# Patient Record
Sex: Female | Born: 1945 | Race: Black or African American | Hispanic: No | Smoking: Former smoker
Health system: Southern US, Community
[De-identification: ages and names within clinical notes are randomized; demographics above are authoritative.]

## PROBLEM LIST (undated history)

## (undated) DIAGNOSIS — H269 Unspecified cataract: Secondary | ICD-10-CM

## (undated) DIAGNOSIS — N200 Calculus of kidney: Secondary | ICD-10-CM

## (undated) DIAGNOSIS — A0472 Enterocolitis due to Clostridium difficile, not specified as recurrent: Secondary | ICD-10-CM

## (undated) DIAGNOSIS — R0602 Shortness of breath: Secondary | ICD-10-CM

## (undated) DIAGNOSIS — M1990 Unspecified osteoarthritis, unspecified site: Secondary | ICD-10-CM

## (undated) DIAGNOSIS — IMO0001 Reserved for inherently not codable concepts without codable children: Secondary | ICD-10-CM

## (undated) DIAGNOSIS — Z8719 Personal history of other diseases of the digestive system: Secondary | ICD-10-CM

## (undated) DIAGNOSIS — I10 Essential (primary) hypertension: Secondary | ICD-10-CM

## (undated) DIAGNOSIS — T7840XA Allergy, unspecified, initial encounter: Secondary | ICD-10-CM

## (undated) DIAGNOSIS — D509 Iron deficiency anemia, unspecified: Secondary | ICD-10-CM

## (undated) DIAGNOSIS — Z923 Personal history of irradiation: Secondary | ICD-10-CM

## (undated) DIAGNOSIS — K90829 Short bowel syndrome, unspecified: Secondary | ICD-10-CM

## (undated) DIAGNOSIS — M109 Gout, unspecified: Secondary | ICD-10-CM

## (undated) DIAGNOSIS — K3184 Gastroparesis: Secondary | ICD-10-CM

## (undated) DIAGNOSIS — G629 Polyneuropathy, unspecified: Secondary | ICD-10-CM

## (undated) DIAGNOSIS — C50919 Malignant neoplasm of unspecified site of unspecified female breast: Secondary | ICD-10-CM

## (undated) DIAGNOSIS — D72819 Decreased white blood cell count, unspecified: Secondary | ICD-10-CM

## (undated) DIAGNOSIS — E0590 Thyrotoxicosis, unspecified without thyrotoxic crisis or storm: Secondary | ICD-10-CM

## (undated) DIAGNOSIS — Z9221 Personal history of antineoplastic chemotherapy: Secondary | ICD-10-CM

## (undated) DIAGNOSIS — Z5189 Encounter for other specified aftercare: Secondary | ICD-10-CM

## (undated) DIAGNOSIS — K912 Postsurgical malabsorption, not elsewhere classified: Secondary | ICD-10-CM

## (undated) DIAGNOSIS — I8390 Asymptomatic varicose veins of unspecified lower extremity: Secondary | ICD-10-CM

## (undated) DIAGNOSIS — K219 Gastro-esophageal reflux disease without esophagitis: Secondary | ICD-10-CM

## (undated) DIAGNOSIS — I82409 Acute embolism and thrombosis of unspecified deep veins of unspecified lower extremity: Secondary | ICD-10-CM

## (undated) DIAGNOSIS — N390 Urinary tract infection, site not specified: Secondary | ICD-10-CM

## (undated) DIAGNOSIS — K222 Esophageal obstruction: Secondary | ICD-10-CM

## (undated) DIAGNOSIS — K648 Other hemorrhoids: Secondary | ICD-10-CM

## (undated) DIAGNOSIS — Z780 Asymptomatic menopausal state: Secondary | ICD-10-CM

## (undated) DIAGNOSIS — E042 Nontoxic multinodular goiter: Secondary | ICD-10-CM

## (undated) DIAGNOSIS — R112 Nausea with vomiting, unspecified: Secondary | ICD-10-CM

## (undated) DIAGNOSIS — E119 Type 2 diabetes mellitus without complications: Secondary | ICD-10-CM

## (undated) DIAGNOSIS — Z87442 Personal history of urinary calculi: Secondary | ICD-10-CM

## (undated) DIAGNOSIS — K5730 Diverticulosis of large intestine without perforation or abscess without bleeding: Secondary | ICD-10-CM

## (undated) DIAGNOSIS — E876 Hypokalemia: Secondary | ICD-10-CM

## (undated) DIAGNOSIS — Z9889 Other specified postprocedural states: Secondary | ICD-10-CM

## (undated) DIAGNOSIS — E538 Deficiency of other specified B group vitamins: Secondary | ICD-10-CM

## (undated) DIAGNOSIS — E669 Obesity, unspecified: Secondary | ICD-10-CM

## (undated) DIAGNOSIS — E890 Postprocedural hypothyroidism: Secondary | ICD-10-CM

## (undated) DIAGNOSIS — K279 Peptic ulcer, site unspecified, unspecified as acute or chronic, without hemorrhage or perforation: Secondary | ICD-10-CM

## (undated) DIAGNOSIS — I209 Angina pectoris, unspecified: Secondary | ICD-10-CM

## (undated) DIAGNOSIS — Z9981 Dependence on supplemental oxygen: Secondary | ICD-10-CM

## (undated) DIAGNOSIS — G43909 Migraine, unspecified, not intractable, without status migrainosus: Secondary | ICD-10-CM

## (undated) DIAGNOSIS — E785 Hyperlipidemia, unspecified: Secondary | ICD-10-CM

## (undated) HISTORY — PX: ABDOMINAL HYSTERECTOMY: SHX81

## (undated) HISTORY — DX: Type 2 diabetes mellitus without complications: E11.9

## (undated) HISTORY — DX: Peptic ulcer, site unspecified, unspecified as acute or chronic, without hemorrhage or perforation: K27.9

## (undated) HISTORY — DX: Short bowel syndrome, unspecified: K90.829

## (undated) HISTORY — PX: KIDNEY STONE SURGERY: SHX686

## (undated) HISTORY — PX: SMALL INTESTINE SURGERY: SHX150

## (undated) HISTORY — DX: Unspecified cataract: H26.9

## (undated) HISTORY — DX: Gout, unspecified: M10.9

## (undated) HISTORY — PX: UPPER GASTROINTESTINAL ENDOSCOPY: SHX188

## (undated) HISTORY — DX: Unspecified osteoarthritis, unspecified site: M19.90

## (undated) HISTORY — PX: VEIN LIGATION AND STRIPPING: SHX2653

## (undated) HISTORY — DX: Allergy, unspecified, initial encounter: T78.40XA

## (undated) HISTORY — DX: Nontoxic multinodular goiter: E04.2

## (undated) HISTORY — DX: Other hemorrhoids: K64.8

## (undated) HISTORY — PX: REDUCTION MAMMAPLASTY: SUR839

## (undated) HISTORY — DX: Hyperlipidemia, unspecified: E78.5

## (undated) HISTORY — DX: Postprocedural hypothyroidism: E89.0

## (undated) HISTORY — PX: OTHER SURGICAL HISTORY: SHX169

## (undated) HISTORY — DX: Gastro-esophageal reflux disease without esophagitis: K21.9

## (undated) HISTORY — PX: EYE SURGERY: SHX253

## (undated) HISTORY — DX: Asymptomatic menopausal state: Z78.0

## (undated) HISTORY — PX: DILATION AND CURETTAGE OF UTERUS: SHX78

## (undated) HISTORY — DX: Gastroparesis: K31.84

## (undated) HISTORY — DX: Deficiency of other specified B group vitamins: E53.8

## (undated) HISTORY — DX: Essential (primary) hypertension: I10

## (undated) HISTORY — DX: Postsurgical malabsorption, not elsewhere classified: K91.2

## (undated) HISTORY — PX: COLON SURGERY: SHX602

## (undated) HISTORY — PX: LITHOTRIPSY: SUR834

## (undated) HISTORY — PX: BUNIONECTOMY: SHX129

## (undated) HISTORY — DX: Enterocolitis due to Clostridium difficile, not specified as recurrent: A04.72

## (undated) HISTORY — DX: Esophageal obstruction: K22.2

## (undated) HISTORY — DX: Decreased white blood cell count, unspecified: D72.819

## (undated) HISTORY — DX: Iron deficiency anemia, unspecified: D50.9

## (undated) HISTORY — PX: ABDOMINAL ADHESION SURGERY: SHX90

## (undated) HISTORY — DX: Diverticulosis of large intestine without perforation or abscess without bleeding: K57.30

## (undated) HISTORY — DX: Obesity, unspecified: E66.9

## (undated) HISTORY — DX: Encounter for other specified aftercare: Z51.89

## (undated) HISTORY — PX: CHOLECYSTECTOMY: SHX55

## (undated) HISTORY — DX: Thyrotoxicosis, unspecified without thyrotoxic crisis or storm: E05.90

---

## 1983-06-02 HISTORY — PX: BREAST BIOPSY: SHX20

## 1998-01-29 ENCOUNTER — Inpatient Hospital Stay (HOSPITAL_COMMUNITY): Admission: RE | Admit: 1998-01-29 | Discharge: 1998-02-11 | Payer: Self-pay | Admitting: *Deleted

## 1998-05-09 ENCOUNTER — Ambulatory Visit (HOSPITAL_COMMUNITY): Admission: RE | Admit: 1998-05-09 | Discharge: 1998-05-09 | Payer: Self-pay | Admitting: Endocrinology

## 1998-05-09 ENCOUNTER — Encounter: Payer: Self-pay | Admitting: Endocrinology

## 1998-05-21 ENCOUNTER — Encounter: Admission: RE | Admit: 1998-05-21 | Discharge: 1998-06-06 | Payer: Self-pay | Admitting: Rheumatology

## 1998-09-10 ENCOUNTER — Encounter: Payer: Self-pay | Admitting: Emergency Medicine

## 1998-09-10 ENCOUNTER — Emergency Department (HOSPITAL_COMMUNITY): Admission: EM | Admit: 1998-09-10 | Discharge: 1998-09-10 | Payer: Self-pay | Admitting: Emergency Medicine

## 1998-11-01 ENCOUNTER — Ambulatory Visit (HOSPITAL_COMMUNITY): Admission: RE | Admit: 1998-11-01 | Discharge: 1998-11-01 | Payer: Self-pay | Admitting: Orthopedic Surgery

## 1998-11-26 ENCOUNTER — Emergency Department (HOSPITAL_COMMUNITY): Admission: EM | Admit: 1998-11-26 | Discharge: 1998-11-26 | Payer: Self-pay | Admitting: Emergency Medicine

## 1998-12-28 ENCOUNTER — Emergency Department (HOSPITAL_COMMUNITY): Admission: EM | Admit: 1998-12-28 | Discharge: 1998-12-28 | Payer: Self-pay | Admitting: Internal Medicine

## 1998-12-28 ENCOUNTER — Encounter: Payer: Self-pay | Admitting: Internal Medicine

## 1999-01-05 ENCOUNTER — Encounter: Payer: Self-pay | Admitting: Emergency Medicine

## 1999-01-06 ENCOUNTER — Inpatient Hospital Stay (HOSPITAL_COMMUNITY): Admission: EM | Admit: 1999-01-06 | Discharge: 1999-01-10 | Payer: Self-pay | Admitting: Emergency Medicine

## 1999-01-09 ENCOUNTER — Encounter: Payer: Self-pay | Admitting: Gastroenterology

## 1999-04-09 ENCOUNTER — Encounter: Admission: RE | Admit: 1999-04-09 | Discharge: 1999-04-09 | Payer: Self-pay | Admitting: *Deleted

## 1999-05-07 ENCOUNTER — Encounter: Payer: Self-pay | Admitting: Emergency Medicine

## 1999-05-07 ENCOUNTER — Emergency Department (HOSPITAL_COMMUNITY): Admission: EM | Admit: 1999-05-07 | Discharge: 1999-05-07 | Payer: Self-pay | Admitting: Emergency Medicine

## 1999-05-08 ENCOUNTER — Observation Stay (HOSPITAL_COMMUNITY): Admission: EM | Admit: 1999-05-08 | Discharge: 1999-05-10 | Payer: Self-pay | Admitting: Emergency Medicine

## 1999-06-12 ENCOUNTER — Encounter: Payer: Self-pay | Admitting: Endocrinology

## 1999-06-12 ENCOUNTER — Ambulatory Visit (HOSPITAL_COMMUNITY): Admission: RE | Admit: 1999-06-12 | Discharge: 1999-06-12 | Payer: Self-pay | Admitting: Endocrinology

## 1999-08-20 ENCOUNTER — Encounter: Admission: RE | Admit: 1999-08-20 | Discharge: 1999-11-18 | Payer: Self-pay | Admitting: Endocrinology

## 1999-09-28 ENCOUNTER — Encounter: Payer: Self-pay | Admitting: Gastroenterology

## 1999-09-28 ENCOUNTER — Encounter: Payer: Self-pay | Admitting: Emergency Medicine

## 1999-09-28 ENCOUNTER — Inpatient Hospital Stay (HOSPITAL_COMMUNITY): Admission: EM | Admit: 1999-09-28 | Discharge: 1999-10-02 | Payer: Self-pay | Admitting: Emergency Medicine

## 1999-09-29 ENCOUNTER — Encounter: Payer: Self-pay | Admitting: Gastroenterology

## 1999-09-30 ENCOUNTER — Encounter: Payer: Self-pay | Admitting: Gastroenterology

## 1999-10-01 ENCOUNTER — Encounter: Payer: Self-pay | Admitting: Internal Medicine

## 1999-11-11 ENCOUNTER — Emergency Department (HOSPITAL_COMMUNITY): Admission: EM | Admit: 1999-11-11 | Discharge: 1999-11-11 | Payer: Self-pay | Admitting: Emergency Medicine

## 1999-11-27 ENCOUNTER — Encounter: Admission: RE | Admit: 1999-11-27 | Discharge: 1999-11-27 | Payer: Self-pay | Admitting: Orthopedic Surgery

## 1999-11-27 ENCOUNTER — Encounter: Payer: Self-pay | Admitting: Orthopedic Surgery

## 2000-04-12 ENCOUNTER — Encounter: Admission: RE | Admit: 2000-04-12 | Discharge: 2000-04-12 | Payer: Self-pay | Admitting: *Deleted

## 2000-07-31 ENCOUNTER — Encounter: Payer: Self-pay | Admitting: Emergency Medicine

## 2000-07-31 ENCOUNTER — Inpatient Hospital Stay (HOSPITAL_COMMUNITY): Admission: EM | Admit: 2000-07-31 | Discharge: 2000-08-09 | Payer: Self-pay | Admitting: Emergency Medicine

## 2000-08-01 ENCOUNTER — Encounter: Payer: Self-pay | Admitting: Internal Medicine

## 2000-08-02 ENCOUNTER — Encounter: Payer: Self-pay | Admitting: Internal Medicine

## 2000-08-03 ENCOUNTER — Encounter: Payer: Self-pay | Admitting: Internal Medicine

## 2000-08-04 ENCOUNTER — Encounter: Payer: Self-pay | Admitting: Internal Medicine

## 2000-08-05 ENCOUNTER — Encounter: Payer: Self-pay | Admitting: Internal Medicine

## 2000-08-06 ENCOUNTER — Encounter: Payer: Self-pay | Admitting: Internal Medicine

## 2000-09-16 ENCOUNTER — Encounter: Payer: Self-pay | Admitting: Internal Medicine

## 2000-09-16 ENCOUNTER — Emergency Department (HOSPITAL_COMMUNITY): Admission: EM | Admit: 2000-09-16 | Discharge: 2000-09-16 | Payer: Self-pay | Admitting: Internal Medicine

## 2001-02-02 ENCOUNTER — Ambulatory Visit (HOSPITAL_COMMUNITY): Admission: RE | Admit: 2001-02-02 | Discharge: 2001-02-02 | Payer: Self-pay | Admitting: Gastroenterology

## 2001-02-02 DIAGNOSIS — K648 Other hemorrhoids: Secondary | ICD-10-CM | POA: Insufficient documentation

## 2001-04-21 ENCOUNTER — Encounter: Admission: RE | Admit: 2001-04-21 | Discharge: 2001-04-21 | Payer: Self-pay | Admitting: *Deleted

## 2001-05-26 ENCOUNTER — Other Ambulatory Visit: Admission: RE | Admit: 2001-05-26 | Discharge: 2001-06-15 | Payer: Self-pay | Admitting: Obstetrics and Gynecology

## 2001-05-26 ENCOUNTER — Other Ambulatory Visit: Admission: RE | Admit: 2001-05-26 | Discharge: 2001-05-26 | Payer: Self-pay | Admitting: Obstetrics and Gynecology

## 2001-06-07 ENCOUNTER — Encounter: Payer: Self-pay | Admitting: Emergency Medicine

## 2001-06-08 ENCOUNTER — Encounter: Payer: Self-pay | Admitting: Emergency Medicine

## 2001-06-08 ENCOUNTER — Inpatient Hospital Stay (HOSPITAL_COMMUNITY): Admission: EM | Admit: 2001-06-08 | Discharge: 2001-06-15 | Payer: Self-pay | Admitting: Emergency Medicine

## 2001-06-09 ENCOUNTER — Encounter: Payer: Self-pay | Admitting: Gastroenterology

## 2001-06-10 ENCOUNTER — Encounter: Payer: Self-pay | Admitting: Gastroenterology

## 2001-06-11 ENCOUNTER — Encounter: Payer: Self-pay | Admitting: Endocrinology

## 2001-06-13 ENCOUNTER — Encounter: Payer: Self-pay | Admitting: Endocrinology

## 2001-06-25 ENCOUNTER — Encounter: Payer: Self-pay | Admitting: Emergency Medicine

## 2001-06-26 ENCOUNTER — Encounter: Payer: Self-pay | Admitting: Endocrinology

## 2001-06-26 ENCOUNTER — Inpatient Hospital Stay (HOSPITAL_COMMUNITY): Admission: EM | Admit: 2001-06-26 | Discharge: 2001-07-01 | Payer: Self-pay | Admitting: Emergency Medicine

## 2001-06-30 ENCOUNTER — Encounter: Payer: Self-pay | Admitting: Internal Medicine

## 2001-07-29 ENCOUNTER — Encounter: Payer: Self-pay | Admitting: Internal Medicine

## 2001-07-29 ENCOUNTER — Inpatient Hospital Stay (HOSPITAL_COMMUNITY): Admission: EM | Admit: 2001-07-29 | Discharge: 2001-08-02 | Payer: Self-pay | Admitting: Emergency Medicine

## 2001-07-30 ENCOUNTER — Encounter: Payer: Self-pay | Admitting: Internal Medicine

## 2002-03-10 ENCOUNTER — Encounter: Payer: Self-pay | Admitting: Emergency Medicine

## 2002-03-11 ENCOUNTER — Encounter: Payer: Self-pay | Admitting: Internal Medicine

## 2002-03-11 ENCOUNTER — Inpatient Hospital Stay (HOSPITAL_COMMUNITY): Admission: EM | Admit: 2002-03-11 | Discharge: 2002-03-24 | Payer: Self-pay | Admitting: Emergency Medicine

## 2002-03-11 ENCOUNTER — Encounter: Payer: Self-pay | Admitting: Emergency Medicine

## 2002-03-13 ENCOUNTER — Encounter: Payer: Self-pay | Admitting: Internal Medicine

## 2002-03-16 ENCOUNTER — Encounter: Payer: Self-pay | Admitting: Internal Medicine

## 2002-04-24 ENCOUNTER — Encounter: Admission: RE | Admit: 2002-04-24 | Discharge: 2002-04-24 | Payer: Self-pay | Admitting: Endocrinology

## 2002-04-24 ENCOUNTER — Encounter: Payer: Self-pay | Admitting: Endocrinology

## 2002-08-12 ENCOUNTER — Encounter: Payer: Self-pay | Admitting: Emergency Medicine

## 2002-08-12 ENCOUNTER — Inpatient Hospital Stay (HOSPITAL_COMMUNITY): Admission: EM | Admit: 2002-08-12 | Discharge: 2002-08-15 | Payer: Self-pay | Admitting: Emergency Medicine

## 2002-08-23 DIAGNOSIS — K222 Esophageal obstruction: Secondary | ICD-10-CM

## 2002-08-23 DIAGNOSIS — K449 Diaphragmatic hernia without obstruction or gangrene: Secondary | ICD-10-CM | POA: Insufficient documentation

## 2003-02-28 ENCOUNTER — Encounter: Admission: RE | Admit: 2003-02-28 | Discharge: 2003-02-28 | Payer: Self-pay | Admitting: Endocrinology

## 2003-02-28 ENCOUNTER — Encounter: Payer: Self-pay | Admitting: Endocrinology

## 2003-06-04 ENCOUNTER — Inpatient Hospital Stay (HOSPITAL_COMMUNITY): Admission: EM | Admit: 2003-06-04 | Discharge: 2003-06-13 | Payer: Self-pay | Admitting: Advanced Practice Midwife

## 2003-08-09 ENCOUNTER — Encounter: Admission: RE | Admit: 2003-08-09 | Discharge: 2003-08-09 | Payer: Self-pay | Admitting: Endocrinology

## 2003-09-18 ENCOUNTER — Encounter (HOSPITAL_COMMUNITY): Admission: RE | Admit: 2003-09-18 | Discharge: 2003-12-17 | Payer: Self-pay | Admitting: Endocrinology

## 2003-10-02 ENCOUNTER — Ambulatory Visit (HOSPITAL_COMMUNITY): Admission: RE | Admit: 2003-10-02 | Discharge: 2003-10-02 | Payer: Self-pay | Admitting: Endocrinology

## 2003-10-18 ENCOUNTER — Encounter: Admission: RE | Admit: 2003-10-18 | Discharge: 2003-10-18 | Payer: Self-pay | Admitting: Otolaryngology

## 2004-01-06 ENCOUNTER — Emergency Department (HOSPITAL_COMMUNITY): Admission: EM | Admit: 2004-01-06 | Discharge: 2004-01-07 | Payer: Self-pay | Admitting: Emergency Medicine

## 2004-01-28 ENCOUNTER — Ambulatory Visit (HOSPITAL_COMMUNITY): Admission: RE | Admit: 2004-01-28 | Discharge: 2004-01-28 | Payer: Self-pay | Admitting: Gastroenterology

## 2004-04-15 ENCOUNTER — Ambulatory Visit: Payer: Self-pay | Admitting: Endocrinology

## 2004-06-24 ENCOUNTER — Ambulatory Visit: Payer: Self-pay | Admitting: Internal Medicine

## 2004-07-04 ENCOUNTER — Ambulatory Visit: Payer: Self-pay | Admitting: Gastroenterology

## 2004-08-07 ENCOUNTER — Ambulatory Visit: Payer: Self-pay | Admitting: Endocrinology

## 2004-08-08 ENCOUNTER — Emergency Department (HOSPITAL_COMMUNITY): Admission: EM | Admit: 2004-08-08 | Discharge: 2004-08-08 | Payer: Self-pay | Admitting: Emergency Medicine

## 2004-09-01 ENCOUNTER — Encounter: Admission: RE | Admit: 2004-09-01 | Discharge: 2004-09-01 | Payer: Self-pay | Admitting: Endocrinology

## 2004-09-10 ENCOUNTER — Ambulatory Visit: Payer: Self-pay | Admitting: Endocrinology

## 2004-09-15 ENCOUNTER — Encounter (HOSPITAL_COMMUNITY): Admission: RE | Admit: 2004-09-15 | Discharge: 2004-12-14 | Payer: Self-pay | Admitting: Endocrinology

## 2004-09-24 ENCOUNTER — Ambulatory Visit: Payer: Self-pay | Admitting: Endocrinology

## 2004-11-05 ENCOUNTER — Ambulatory Visit: Payer: Self-pay | Admitting: Endocrinology

## 2004-12-05 ENCOUNTER — Ambulatory Visit: Payer: Self-pay | Admitting: Endocrinology

## 2005-01-23 ENCOUNTER — Ambulatory Visit: Payer: Self-pay | Admitting: Endocrinology

## 2005-03-08 ENCOUNTER — Encounter: Admission: RE | Admit: 2005-03-08 | Discharge: 2005-03-08 | Payer: Self-pay | Admitting: Rheumatology

## 2005-04-01 ENCOUNTER — Ambulatory Visit: Payer: Self-pay | Admitting: Endocrinology

## 2005-04-28 ENCOUNTER — Ambulatory Visit: Payer: Self-pay | Admitting: Gastroenterology

## 2005-04-28 ENCOUNTER — Emergency Department (HOSPITAL_COMMUNITY): Admission: EM | Admit: 2005-04-28 | Discharge: 2005-04-28 | Payer: Self-pay | Admitting: Emergency Medicine

## 2005-04-30 ENCOUNTER — Ambulatory Visit: Payer: Self-pay | Admitting: Gastroenterology

## 2005-05-14 ENCOUNTER — Ambulatory Visit: Payer: Self-pay | Admitting: *Deleted

## 2005-05-18 ENCOUNTER — Encounter: Payer: Self-pay | Admitting: Internal Medicine

## 2005-05-18 ENCOUNTER — Ambulatory Visit: Payer: Self-pay

## 2005-06-23 ENCOUNTER — Ambulatory Visit: Payer: Self-pay | Admitting: Gastroenterology

## 2005-06-24 ENCOUNTER — Ambulatory Visit: Payer: Self-pay | Admitting: *Deleted

## 2005-07-08 ENCOUNTER — Ambulatory Visit: Payer: Self-pay | Admitting: Gastroenterology

## 2005-07-15 ENCOUNTER — Ambulatory Visit: Payer: Self-pay | Admitting: Gastroenterology

## 2005-07-27 ENCOUNTER — Ambulatory Visit: Payer: Self-pay | Admitting: Endocrinology

## 2005-08-13 ENCOUNTER — Emergency Department (HOSPITAL_COMMUNITY): Admission: EM | Admit: 2005-08-13 | Discharge: 2005-08-13 | Payer: Self-pay | Admitting: Emergency Medicine

## 2005-08-14 ENCOUNTER — Encounter: Payer: Self-pay | Admitting: Vascular Surgery

## 2005-08-14 ENCOUNTER — Ambulatory Visit (HOSPITAL_COMMUNITY): Admission: RE | Admit: 2005-08-14 | Discharge: 2005-08-14 | Payer: Self-pay | Admitting: Emergency Medicine

## 2005-08-17 ENCOUNTER — Ambulatory Visit: Payer: Self-pay | Admitting: Endocrinology

## 2005-09-04 ENCOUNTER — Encounter: Admission: RE | Admit: 2005-09-04 | Discharge: 2005-09-04 | Payer: Self-pay | Admitting: Endocrinology

## 2005-09-18 ENCOUNTER — Ambulatory Visit: Payer: Self-pay | Admitting: Endocrinology

## 2005-09-24 ENCOUNTER — Ambulatory Visit: Payer: Self-pay | Admitting: Endocrinology

## 2005-11-12 ENCOUNTER — Ambulatory Visit: Payer: Self-pay | Admitting: Endocrinology

## 2006-02-26 ENCOUNTER — Ambulatory Visit: Payer: Self-pay | Admitting: Endocrinology

## 2006-05-31 ENCOUNTER — Ambulatory Visit: Payer: Self-pay | Admitting: Cardiovascular Disease

## 2006-05-31 ENCOUNTER — Ambulatory Visit: Payer: Self-pay | Admitting: Endocrinology

## 2006-09-07 ENCOUNTER — Ambulatory Visit: Payer: Self-pay | Admitting: Cardiology

## 2006-09-07 LAB — CONVERTED CEMR LAB
Free T4: 0.7 ng/dL (ref 0.6–1.6)
TSH: 0.45 microintl units/mL (ref 0.35–5.50)

## 2006-09-22 ENCOUNTER — Ambulatory Visit: Payer: Self-pay | Admitting: Endocrinology

## 2006-09-22 LAB — CONVERTED CEMR LAB
ALT: 23 units/L (ref 0–40)
AST: 21 units/L (ref 0–37)
Bilirubin, Direct: 0.2 mg/dL (ref 0.0–0.3)
CO2: 32 meq/L (ref 19–32)
Calcium: 9.1 mg/dL (ref 8.4–10.5)
Chloride: 109 meq/L (ref 96–112)
Eosinophils Absolute: 0.1 10*3/uL (ref 0.0–0.6)
Eosinophils Relative: 1.5 % (ref 0.0–5.0)
Glucose, Bld: 90 mg/dL (ref 70–99)
HCT: 34.3 % — ABNORMAL LOW (ref 36.0–46.0)
Hemoglobin: 11.6 g/dL — ABNORMAL LOW (ref 12.0–15.0)
Hgb A1c MFr Bld: 5.2 % (ref 4.6–6.0)
Ketones, ur: NEGATIVE mg/dL
Leukocytes, UA: NEGATIVE
Lymphocytes Relative: 49.9 % — ABNORMAL HIGH (ref 12.0–46.0)
MCV: 88.5 fL (ref 78.0–100.0)
Monocytes Absolute: 0.4 10*3/uL (ref 0.2–0.7)
Neutrophils Relative %: 39.3 % — ABNORMAL LOW (ref 43.0–77.0)
RBC: 3.87 M/uL (ref 3.87–5.11)
TSH: 0.38 microintl units/mL (ref 0.35–5.50)
Total CHOL/HDL Ratio: 2.3
Total Protein: 7.1 g/dL (ref 6.0–8.3)
WBC: 4 10*3/uL — ABNORMAL LOW (ref 4.5–10.5)

## 2006-09-29 ENCOUNTER — Ambulatory Visit: Payer: Self-pay | Admitting: *Deleted

## 2006-10-27 ENCOUNTER — Ambulatory Visit: Payer: Self-pay | Admitting: Internal Medicine

## 2006-10-28 ENCOUNTER — Ambulatory Visit (HOSPITAL_COMMUNITY): Admission: RE | Admit: 2006-10-28 | Discharge: 2006-10-28 | Payer: Self-pay | Admitting: Internal Medicine

## 2006-11-02 ENCOUNTER — Encounter: Admission: RE | Admit: 2006-11-02 | Discharge: 2006-11-02 | Payer: Self-pay | Admitting: Endocrinology

## 2006-11-02 ENCOUNTER — Other Ambulatory Visit: Admission: RE | Admit: 2006-11-02 | Discharge: 2006-11-02 | Payer: Self-pay | Admitting: Interventional Radiology

## 2006-11-02 ENCOUNTER — Encounter (INDEPENDENT_AMBULATORY_CARE_PROVIDER_SITE_OTHER): Payer: Self-pay | Admitting: Interventional Radiology

## 2006-11-08 ENCOUNTER — Ambulatory Visit: Payer: Self-pay | Admitting: Gastroenterology

## 2006-11-08 ENCOUNTER — Inpatient Hospital Stay (HOSPITAL_COMMUNITY): Admission: EM | Admit: 2006-11-08 | Discharge: 2006-11-14 | Payer: Self-pay | Admitting: Emergency Medicine

## 2006-11-12 ENCOUNTER — Ambulatory Visit: Payer: Self-pay | Admitting: Internal Medicine

## 2006-11-19 ENCOUNTER — Ambulatory Visit: Payer: Self-pay | Admitting: Gastroenterology

## 2006-11-19 LAB — CONVERTED CEMR LAB
CO2: 25 meq/L (ref 19–32)
Sodium: 139 meq/L (ref 135–145)

## 2006-12-10 ENCOUNTER — Encounter: Payer: Self-pay | Admitting: Endocrinology

## 2006-12-10 DIAGNOSIS — E1169 Type 2 diabetes mellitus with other specified complication: Secondary | ICD-10-CM | POA: Insufficient documentation

## 2006-12-10 DIAGNOSIS — M1990 Unspecified osteoarthritis, unspecified site: Secondary | ICD-10-CM | POA: Insufficient documentation

## 2006-12-10 DIAGNOSIS — M109 Gout, unspecified: Secondary | ICD-10-CM | POA: Insufficient documentation

## 2006-12-10 DIAGNOSIS — I10 Essential (primary) hypertension: Secondary | ICD-10-CM | POA: Insufficient documentation

## 2006-12-10 DIAGNOSIS — E785 Hyperlipidemia, unspecified: Secondary | ICD-10-CM | POA: Insufficient documentation

## 2006-12-10 DIAGNOSIS — E119 Type 2 diabetes mellitus without complications: Secondary | ICD-10-CM | POA: Insufficient documentation

## 2006-12-10 DIAGNOSIS — K5730 Diverticulosis of large intestine without perforation or abscess without bleeding: Secondary | ICD-10-CM | POA: Insufficient documentation

## 2006-12-16 ENCOUNTER — Ambulatory Visit: Payer: Self-pay | Admitting: Gastroenterology

## 2007-01-17 ENCOUNTER — Ambulatory Visit: Payer: Self-pay | Admitting: Endocrinology

## 2007-03-22 ENCOUNTER — Ambulatory Visit: Payer: Self-pay | Admitting: Endocrinology

## 2007-03-22 LAB — CONVERTED CEMR LAB: TSH: 0.57 microintl units/mL (ref 0.35–5.50)

## 2007-06-09 ENCOUNTER — Ambulatory Visit: Payer: Self-pay | Admitting: Endocrinology

## 2007-06-10 ENCOUNTER — Encounter: Payer: Self-pay | Admitting: Endocrinology

## 2007-06-15 ENCOUNTER — Ambulatory Visit: Payer: Self-pay | Admitting: Endocrinology

## 2007-06-22 ENCOUNTER — Encounter: Admission: RE | Admit: 2007-06-22 | Discharge: 2007-06-22 | Payer: Self-pay | Admitting: Endocrinology

## 2007-07-07 ENCOUNTER — Ambulatory Visit: Payer: Self-pay | Admitting: Gastroenterology

## 2007-07-07 LAB — CONVERTED CEMR LAB
ALT: 22 units/L (ref 0–35)
AST: 24 units/L (ref 0–37)
Basophils Absolute: 0 10*3/uL (ref 0.0–0.1)
Bilirubin, Direct: 0.2 mg/dL (ref 0.0–0.3)
CO2: 29 meq/L (ref 19–32)
Calcium: 8.2 mg/dL — ABNORMAL LOW (ref 8.4–10.5)
Creatinine, Ser: 0.7 mg/dL (ref 0.4–1.2)
Eosinophils Relative: 0.9 % (ref 0.0–5.0)
Ferritin: 190.7 ng/mL (ref 10.0–291.0)
GFR calc Af Amer: 109 mL/min
Glucose, Bld: 91 mg/dL (ref 70–99)
HCT: 32.2 % — ABNORMAL LOW (ref 36.0–46.0)
Hemoglobin: 10.4 g/dL — ABNORMAL LOW (ref 12.0–15.0)
MCHC: 32.3 g/dL (ref 30.0–36.0)
MCV: 91.9 fL (ref 78.0–100.0)
Monocytes Absolute: 0.4 10*3/uL (ref 0.2–0.7)
Neutrophils Relative %: 41.4 % — ABNORMAL LOW (ref 43.0–77.0)
RDW: 12 % (ref 11.5–14.6)
Saturation Ratios: 34.5 % (ref 20.0–50.0)
TSH: 0.3 microintl units/mL — ABNORMAL LOW (ref 0.35–5.50)
Total Bilirubin: 0.8 mg/dL (ref 0.3–1.2)
Total Protein: 6.8 g/dL (ref 6.0–8.3)

## 2007-07-13 ENCOUNTER — Ambulatory Visit: Payer: Self-pay | Admitting: Gastroenterology

## 2007-07-29 ENCOUNTER — Ambulatory Visit: Payer: Self-pay | Admitting: Gastroenterology

## 2007-07-29 LAB — CONVERTED CEMR LAB
T3, Free: 2.6 pg/mL (ref 2.3–4.2)
T4, Total: 8.1 ug/dL (ref 5.0–12.5)
TSH: 0.25 microintl units/mL — ABNORMAL LOW (ref 0.35–5.50)

## 2007-08-12 ENCOUNTER — Encounter: Payer: Self-pay | Admitting: Endocrinology

## 2007-08-16 ENCOUNTER — Ambulatory Visit: Payer: Self-pay | Admitting: Endocrinology

## 2007-08-31 ENCOUNTER — Encounter: Payer: Self-pay | Admitting: Endocrinology

## 2007-09-05 ENCOUNTER — Encounter: Admission: RE | Admit: 2007-09-05 | Discharge: 2007-09-05 | Payer: Self-pay | Admitting: Endocrinology

## 2007-09-12 ENCOUNTER — Telehealth (INDEPENDENT_AMBULATORY_CARE_PROVIDER_SITE_OTHER): Payer: Self-pay | Admitting: *Deleted

## 2007-09-27 ENCOUNTER — Ambulatory Visit: Payer: Self-pay | Admitting: Gastroenterology

## 2007-09-27 ENCOUNTER — Telehealth (INDEPENDENT_AMBULATORY_CARE_PROVIDER_SITE_OTHER): Payer: Self-pay | Admitting: *Deleted

## 2007-09-27 LAB — CONVERTED CEMR LAB
ALT: 21 units/L (ref 0–35)
AST: 24 units/L (ref 0–37)
Bilirubin, Direct: 0.1 mg/dL (ref 0.0–0.3)
CO2: 27 meq/L (ref 19–32)
Calcium: 9.1 mg/dL (ref 8.4–10.5)
Chloride: 112 meq/L (ref 96–112)
Eosinophils Relative: 2 % (ref 0.0–5.0)
Ferritin: 157.3 ng/mL (ref 10.0–291.0)
Folate: 8 ng/mL
GFR calc non Af Amer: 67 mL/min
Iron: 75 ug/dL (ref 42–145)
Lymphocytes Relative: 46.9 % — ABNORMAL HIGH (ref 12.0–46.0)
Magnesium: 1.2 mg/dL — ABNORMAL LOW (ref 1.5–2.5)
Monocytes Relative: 9.4 % (ref 3.0–12.0)
Neutrophils Relative %: 41 % — ABNORMAL LOW (ref 43.0–77.0)
Platelets: 258 10*3/uL (ref 150–400)
RDW: 12.4 % (ref 11.5–14.6)
Sed Rate: 27 mm/hr — ABNORMAL HIGH (ref 0–22)
Sodium: 143 meq/L (ref 135–145)
TSH: 0.22 microintl units/mL — ABNORMAL LOW (ref 0.35–5.50)
Total Bilirubin: 0.5 mg/dL (ref 0.3–1.2)
Transferrin: 297.5 mg/dL (ref >=212.0)
Vitamin B-12: 232 pg/mL (ref 211–911)
WBC: 3.7 10*3/uL — ABNORMAL LOW (ref 4.5–10.5)

## 2007-09-30 ENCOUNTER — Ambulatory Visit: Payer: Self-pay | Admitting: Gastroenterology

## 2007-09-30 ENCOUNTER — Encounter: Payer: Self-pay | Admitting: Gastroenterology

## 2007-11-23 ENCOUNTER — Encounter: Payer: Self-pay | Admitting: Gastroenterology

## 2008-01-03 ENCOUNTER — Ambulatory Visit: Payer: Self-pay | Admitting: Endocrinology

## 2008-01-03 LAB — CONVERTED CEMR LAB
Bilirubin Urine: NEGATIVE
Ketones, urine, test strip: NEGATIVE
Nitrite: NEGATIVE
Urobilinogen, UA: 0.2

## 2008-01-04 ENCOUNTER — Encounter: Payer: Self-pay | Admitting: Endocrinology

## 2008-02-22 ENCOUNTER — Encounter: Payer: Self-pay | Admitting: Endocrinology

## 2008-03-14 ENCOUNTER — Encounter: Payer: Self-pay | Admitting: Endocrinology

## 2008-04-24 ENCOUNTER — Encounter: Payer: Self-pay | Admitting: Endocrinology

## 2008-05-07 ENCOUNTER — Ambulatory Visit: Payer: Self-pay | Admitting: Endocrinology

## 2008-05-07 ENCOUNTER — Ambulatory Visit: Payer: Self-pay

## 2008-05-15 ENCOUNTER — Telehealth: Payer: Self-pay | Admitting: Gastroenterology

## 2008-05-17 ENCOUNTER — Ambulatory Visit: Payer: Self-pay | Admitting: Gastroenterology

## 2008-05-17 ENCOUNTER — Inpatient Hospital Stay (HOSPITAL_COMMUNITY): Admission: AD | Admit: 2008-05-17 | Discharge: 2008-05-22 | Payer: Self-pay | Admitting: Internal Medicine

## 2008-05-17 DIAGNOSIS — R1011 Right upper quadrant pain: Secondary | ICD-10-CM | POA: Insufficient documentation

## 2008-05-18 ENCOUNTER — Encounter: Payer: Self-pay | Admitting: Gastroenterology

## 2008-06-12 ENCOUNTER — Ambulatory Visit: Payer: Self-pay | Admitting: Gastroenterology

## 2008-06-12 DIAGNOSIS — R197 Diarrhea, unspecified: Secondary | ICD-10-CM | POA: Insufficient documentation

## 2008-06-12 DIAGNOSIS — K219 Gastro-esophageal reflux disease without esophagitis: Secondary | ICD-10-CM | POA: Insufficient documentation

## 2008-06-12 HISTORY — DX: Gastro-esophageal reflux disease without esophagitis: K21.9

## 2008-06-12 LAB — CONVERTED CEMR LAB
ALT: 33 units/L (ref 0–35)
AST: 32 units/L (ref 0–37)
Albumin: 3.8 g/dL (ref 3.5–5.2)
Alkaline Phosphatase: 72 units/L (ref 39–117)
BUN: 15 mg/dL (ref 6–23)
Basophils Absolute: 0 10*3/uL (ref 0.0–0.1)
Basophils Relative: 0.7 % (ref 0.0–3.0)
Basophils Relative: 0.7 % (ref 0.0–3.0)
CO2: 29 meq/L (ref 19–32)
Calcium: 9.6 mg/dL (ref 8.4–10.5)
Chloride: 108 meq/L (ref 96–112)
Creatinine, Ser: 0.8 mg/dL (ref 0.4–1.2)
Creatinine, Ser: 0.8 mg/dL (ref 0.4–1.2)
Eosinophils Absolute: 0.1 10*3/uL (ref 0.0–0.7)
Eosinophils Relative: 1.4 % (ref 0.0–5.0)
GFR calc Af Amer: 93 mL/min
GFR calc non Af Amer: 77 mL/min
Glucose, Bld: 92 mg/dL (ref 70–99)
HCT: 33.6 % — ABNORMAL LOW (ref 36.0–46.0)
HCT: 33.6 % — ABNORMAL LOW (ref 36.0–46.0)
Hemoglobin: 11.3 g/dL — ABNORMAL LOW (ref 12.0–15.0)
MCHC: 33.5 g/dL (ref 30.0–36.0)
MCV: 91.5 fL (ref 78.0–100.0)
Monocytes Absolute: 0.4 10*3/uL (ref 0.1–1.0)
Monocytes Absolute: 0.4 10*3/uL (ref 0.1–1.0)
Monocytes Relative: 10.8 % (ref 3.0–12.0)
Neutro Abs: 1.6 10*3/uL (ref 1.4–7.7)
Platelets: 289 10*3/uL (ref 150–400)
Potassium: 4.9 meq/L (ref 3.5–5.1)
RBC: 3.67 M/uL — ABNORMAL LOW (ref 3.87–5.11)
RBC: 3.67 M/uL — ABNORMAL LOW (ref 3.87–5.11)
RDW: 12.8 % (ref 11.5–14.6)
Sed Rate: 24 mm/hr — ABNORMAL HIGH (ref 0–22)
Sodium: 140 meq/L (ref 135–145)
TSH: 0.03 microintl units/mL — ABNORMAL LOW (ref 0.35–5.50)
Total Bilirubin: 1 mg/dL (ref 0.3–1.2)
Total Protein: 7.5 g/dL (ref 6.0–8.3)
WBC: 3.8 10*3/uL — ABNORMAL LOW (ref 4.5–10.5)

## 2008-06-13 ENCOUNTER — Ambulatory Visit: Payer: Self-pay | Admitting: Gastroenterology

## 2008-06-20 ENCOUNTER — Ambulatory Visit: Payer: Self-pay | Admitting: Gastroenterology

## 2008-06-25 ENCOUNTER — Ambulatory Visit: Payer: Self-pay | Admitting: Gastroenterology

## 2008-06-25 ENCOUNTER — Encounter: Payer: Self-pay | Admitting: Gastroenterology

## 2008-06-26 ENCOUNTER — Encounter: Payer: Self-pay | Admitting: Gastroenterology

## 2008-06-29 ENCOUNTER — Ambulatory Visit: Payer: Self-pay | Admitting: Gastroenterology

## 2008-06-29 LAB — CONVERTED CEMR LAB
Albumin: 3.4 g/dL — ABNORMAL LOW (ref 3.5–5.2)
Amylase: 108 units/L (ref 27–131)
Basophils Absolute: 0 10*3/uL (ref 0.0–0.1)
Bilirubin, Direct: 0.1 mg/dL (ref 0.0–0.3)
Calcium: 9 mg/dL (ref 8.4–10.5)
Crystals: NEGATIVE
Eosinophils Absolute: 0.1 10*3/uL (ref 0.0–0.7)
GFR calc Af Amer: 108 mL/min
Glucose, Bld: 101 mg/dL — ABNORMAL HIGH (ref 70–99)
HCT: 30.9 % — ABNORMAL LOW (ref 36.0–46.0)
Hemoglobin, Urine: NEGATIVE
Lipase: 23 units/L (ref 11.0–59.0)
MCHC: 33.5 g/dL (ref 30.0–36.0)
MCV: 91 fL (ref 78.0–100.0)
Monocytes Absolute: 0.4 10*3/uL (ref 0.1–1.0)
Nitrite: NEGATIVE
Platelets: 223 10*3/uL (ref 150–400)
RDW: 12.8 % (ref 11.5–14.6)
Sodium: 139 meq/L (ref 135–145)
TSH: 0.1 microintl units/mL — ABNORMAL LOW (ref 0.35–5.50)
Total Protein: 6.8 g/dL (ref 6.0–8.3)
Urine Glucose: NEGATIVE mg/dL
Urobilinogen, UA: 0.2 (ref 0.0–1.0)

## 2008-07-03 ENCOUNTER — Telehealth: Payer: Self-pay | Admitting: Gastroenterology

## 2008-07-03 DIAGNOSIS — M549 Dorsalgia, unspecified: Secondary | ICD-10-CM | POA: Insufficient documentation

## 2008-07-04 ENCOUNTER — Ambulatory Visit (HOSPITAL_COMMUNITY): Admission: RE | Admit: 2008-07-04 | Discharge: 2008-07-04 | Payer: Self-pay | Admitting: Gastroenterology

## 2008-07-13 ENCOUNTER — Encounter: Admission: RE | Admit: 2008-07-13 | Discharge: 2008-07-13 | Payer: Self-pay | Admitting: Endocrinology

## 2008-08-30 ENCOUNTER — Telehealth (INDEPENDENT_AMBULATORY_CARE_PROVIDER_SITE_OTHER): Payer: Self-pay | Admitting: *Deleted

## 2008-10-11 ENCOUNTER — Ambulatory Visit: Payer: Self-pay | Admitting: Endocrinology

## 2008-10-11 DIAGNOSIS — Z780 Asymptomatic menopausal state: Secondary | ICD-10-CM | POA: Insufficient documentation

## 2008-10-11 DIAGNOSIS — N209 Urinary calculus, unspecified: Secondary | ICD-10-CM | POA: Insufficient documentation

## 2008-10-11 HISTORY — DX: Asymptomatic menopausal state: Z78.0

## 2008-10-11 LAB — CONVERTED CEMR LAB
AST: 24 units/L (ref 0–37)
Albumin: 3.5 g/dL (ref 3.5–5.2)
Basophils Absolute: 0 10*3/uL (ref 0.0–0.1)
CO2: 29 meq/L (ref 19–32)
Chloride: 107 meq/L (ref 96–112)
Creatinine,U: 182.3 mg/dL
Eosinophils Absolute: 0 10*3/uL (ref 0.0–0.7)
Glucose, Bld: 69 mg/dL — ABNORMAL LOW (ref 70–99)
HCT: 32.1 % — ABNORMAL LOW (ref 36.0–46.0)
Hemoglobin: 10.6 g/dL — ABNORMAL LOW (ref 12.0–15.0)
Lymphs Abs: 1.8 10*3/uL (ref 0.7–4.0)
MCHC: 32.9 g/dL (ref 30.0–36.0)
Microalb, Ur: 1.1 mg/dL (ref 0.0–1.9)
Neutro Abs: 2.5 10*3/uL (ref 1.4–7.7)
Potassium: 3.9 meq/L (ref 3.5–5.1)
RDW: 12.5 % (ref 11.5–14.6)
Sodium: 141 meq/L (ref 135–145)
Specific Gravity, Urine: 1.025 (ref 1.000–1.030)
TSH: 0.16 microintl units/mL — ABNORMAL LOW (ref 0.35–5.50)
Total Protein, Urine: NEGATIVE mg/dL
Total Protein: 6.7 g/dL (ref 6.0–8.3)
Urine Glucose: NEGATIVE mg/dL
Urobilinogen, UA: 0.2 (ref 0.0–1.0)

## 2008-10-12 ENCOUNTER — Ambulatory Visit: Payer: Self-pay | Admitting: Family Medicine

## 2008-10-12 ENCOUNTER — Encounter: Payer: Self-pay | Admitting: Endocrinology

## 2008-11-08 ENCOUNTER — Telehealth (INDEPENDENT_AMBULATORY_CARE_PROVIDER_SITE_OTHER): Payer: Self-pay | Admitting: *Deleted

## 2008-12-25 ENCOUNTER — Telehealth (INDEPENDENT_AMBULATORY_CARE_PROVIDER_SITE_OTHER): Payer: Self-pay | Admitting: *Deleted

## 2009-01-01 ENCOUNTER — Ambulatory Visit: Payer: Self-pay | Admitting: Endocrinology

## 2009-01-03 ENCOUNTER — Telehealth (INDEPENDENT_AMBULATORY_CARE_PROVIDER_SITE_OTHER): Payer: Self-pay | Admitting: *Deleted

## 2009-01-09 ENCOUNTER — Encounter (HOSPITAL_COMMUNITY): Admission: RE | Admit: 2009-01-09 | Discharge: 2009-02-28 | Payer: Self-pay | Admitting: Endocrinology

## 2009-01-15 ENCOUNTER — Ambulatory Visit: Payer: Self-pay | Admitting: Endocrinology

## 2009-01-15 DIAGNOSIS — M25569 Pain in unspecified knee: Secondary | ICD-10-CM

## 2009-02-20 ENCOUNTER — Ambulatory Visit: Payer: Self-pay | Admitting: Internal Medicine

## 2009-02-20 DIAGNOSIS — R490 Dysphonia: Secondary | ICD-10-CM | POA: Insufficient documentation

## 2009-02-20 DIAGNOSIS — L259 Unspecified contact dermatitis, unspecified cause: Secondary | ICD-10-CM | POA: Insufficient documentation

## 2009-04-02 ENCOUNTER — Ambulatory Visit: Payer: Self-pay | Admitting: Endocrinology

## 2009-04-02 ENCOUNTER — Telehealth: Payer: Self-pay | Admitting: Endocrinology

## 2009-04-02 DIAGNOSIS — R42 Dizziness and giddiness: Secondary | ICD-10-CM | POA: Insufficient documentation

## 2009-04-02 DIAGNOSIS — E042 Nontoxic multinodular goiter: Secondary | ICD-10-CM

## 2009-04-02 HISTORY — DX: Nontoxic multinodular goiter: E04.2

## 2009-04-02 LAB — CONVERTED CEMR LAB: PTH: 66.2 pg/mL (ref 14.0–72.0)

## 2009-04-03 ENCOUNTER — Ambulatory Visit: Payer: Self-pay | Admitting: Endocrinology

## 2009-04-04 ENCOUNTER — Encounter: Payer: Self-pay | Admitting: Endocrinology

## 2009-04-04 LAB — CONVERTED CEMR LAB
ALT: 22 units/L (ref 0–35)
Alkaline Phosphatase: 67 units/L (ref 39–117)
Basophils Relative: 0.7 % (ref 0.0–3.0)
Bilirubin, Direct: 0.1 mg/dL (ref 0.0–0.3)
CO2: 24 meq/L (ref 19–32)
Calcium: 9.1 mg/dL (ref 8.4–10.5)
Creatinine, Ser: 0.7 mg/dL (ref 0.4–1.2)
Creatinine,U: 25.5 mg/dL
Eosinophils Absolute: 0 10*3/uL (ref 0.0–0.7)
GFR calc non Af Amer: 108.01 mL/min (ref >60)
HDL: 78.2 mg/dL (ref >39.00)
Hemoglobin: 10.7 g/dL — ABNORMAL LOW (ref 12.0–15.0)
LDL Cholesterol: 43 mg/dL (ref 0–99)
Lymphocytes Relative: 42.1 % (ref 12.0–46.0)
Microalb, Ur: 0.2 mg/dL (ref 0.0–1.9)
Monocytes Relative: 8 % (ref 3.0–12.0)
Neutro Abs: 1.3 10*3/uL — ABNORMAL LOW (ref 1.4–7.7)
Neutrophils Relative %: 47.9 % (ref 43.0–77.0)
Nitrite: NEGATIVE
RBC: 3.42 M/uL — ABNORMAL LOW (ref 3.87–5.11)
Sodium: 137 meq/L (ref 135–145)
Specific Gravity, Urine: <=1.005 (ref 1.000–1.030)
Total Bilirubin: 0.6 mg/dL (ref 0.3–1.2)
Total CHOL/HDL Ratio: 2
Total Protein, Urine: NEGATIVE mg/dL
Total Protein: 7.5 g/dL (ref 6.0–8.3)
Triglycerides: 92 mg/dL (ref 0.0–149.0)
Urine Glucose: NEGATIVE mg/dL
Urobilinogen, UA: 0.2 (ref 0.0–1.0)
VLDL: 18.4 mg/dL (ref 0.0–40.0)
WBC: 2.6 10*3/uL — ABNORMAL LOW (ref 4.5–10.5)

## 2009-04-21 ENCOUNTER — Ambulatory Visit: Payer: Self-pay | Admitting: Vascular Surgery

## 2009-04-21 ENCOUNTER — Emergency Department (HOSPITAL_COMMUNITY): Admission: EM | Admit: 2009-04-21 | Discharge: 2009-04-21 | Payer: Self-pay | Admitting: Emergency Medicine

## 2009-04-21 ENCOUNTER — Encounter (INDEPENDENT_AMBULATORY_CARE_PROVIDER_SITE_OTHER): Payer: Self-pay | Admitting: Emergency Medicine

## 2009-04-23 ENCOUNTER — Encounter: Payer: Self-pay | Admitting: Endocrinology

## 2009-05-08 ENCOUNTER — Ambulatory Visit: Payer: Self-pay | Admitting: Endocrinology

## 2009-05-08 DIAGNOSIS — D509 Iron deficiency anemia, unspecified: Secondary | ICD-10-CM

## 2009-05-08 HISTORY — DX: Iron deficiency anemia, unspecified: D50.9

## 2009-05-08 LAB — CONVERTED CEMR LAB
Basophils Relative: 0.2 % (ref 0.0–3.0)
Eosinophils Absolute: 0 10*3/uL (ref 0.0–0.7)
Eosinophils Relative: 0.8 % (ref 0.0–5.0)
HCT: 32.8 % — ABNORMAL LOW (ref 36.0–46.0)
Lymphs Abs: 1.4 10*3/uL (ref 0.7–4.0)
MCHC: 33.5 g/dL (ref 30.0–36.0)
MCV: 94.3 fL (ref 78.0–100.0)
Monocytes Absolute: 0.4 10*3/uL (ref 0.1–1.0)
Neutro Abs: 2.4 10*3/uL (ref 1.4–7.7)
RBC: 3.48 M/uL — ABNORMAL LOW (ref 3.87–5.11)
WBC: 4.2 10*3/uL — ABNORMAL LOW (ref 4.5–10.5)

## 2009-06-01 HISTORY — PX: ESOPHAGOGASTRODUODENOSCOPY: SHX1529

## 2009-06-01 HISTORY — PX: FLEXIBLE SIGMOIDOSCOPY: SHX1649

## 2009-07-17 ENCOUNTER — Ambulatory Visit: Payer: Self-pay | Admitting: Endocrinology

## 2009-07-18 ENCOUNTER — Encounter: Admission: RE | Admit: 2009-07-18 | Discharge: 2009-07-18 | Payer: Self-pay | Admitting: Endocrinology

## 2009-08-13 ENCOUNTER — Ambulatory Visit: Payer: Self-pay | Admitting: Endocrinology

## 2009-08-13 DIAGNOSIS — E890 Postprocedural hypothyroidism: Secondary | ICD-10-CM | POA: Insufficient documentation

## 2009-08-13 DIAGNOSIS — R21 Rash and other nonspecific skin eruption: Secondary | ICD-10-CM | POA: Insufficient documentation

## 2009-08-13 HISTORY — DX: Postprocedural hypothyroidism: E89.0

## 2009-08-13 LAB — CONVERTED CEMR LAB
Hgb A1c MFr Bld: 5.5 % (ref 4.6–6.5)
TSH: 5.56 microintl units/mL — ABNORMAL HIGH (ref 0.35–5.50)

## 2009-08-27 ENCOUNTER — Encounter: Payer: Self-pay | Admitting: Endocrinology

## 2009-08-30 ENCOUNTER — Encounter (INDEPENDENT_AMBULATORY_CARE_PROVIDER_SITE_OTHER): Payer: Self-pay | Admitting: *Deleted

## 2009-08-30 ENCOUNTER — Ambulatory Visit: Payer: Self-pay | Admitting: Gastroenterology

## 2009-08-30 DIAGNOSIS — E538 Deficiency of other specified B group vitamins: Secondary | ICD-10-CM | POA: Insufficient documentation

## 2009-08-30 HISTORY — DX: Deficiency of other specified B group vitamins: E53.8

## 2009-08-30 LAB — CONVERTED CEMR LAB
Basophils Absolute: 0 K/uL (ref 0.0–0.1)
Basophils Relative: 0.2 % (ref 0.0–3.0)
Eosinophils Absolute: 0 K/uL (ref 0.0–0.7)
Eosinophils Relative: 1 % (ref 0.0–5.0)
Ferritin: 97.2 ng/mL (ref 10.0–291.0)
Folate: 12.1 ng/mL
HCT: 32.6 % — ABNORMAL LOW (ref 36.0–46.0)
Hemoglobin: 11.1 g/dL — ABNORMAL LOW (ref 12.0–15.0)
Iron: 88 ug/dL (ref 42–145)
Lymphocytes Relative: 39 % (ref 12.0–46.0)
Lymphs Abs: 1.6 K/uL (ref 0.7–4.0)
MCHC: 34 g/dL (ref 30.0–36.0)
MCV: 92.7 fL (ref 78.0–100.0)
Monocytes Absolute: 0.3 K/uL (ref 0.1–1.0)
Monocytes Relative: 7.1 % (ref 3.0–12.0)
Neutro Abs: 2.2 K/uL (ref 1.4–7.7)
Neutrophils Relative %: 52.7 % (ref 43.0–77.0)
Platelets: 257 K/uL (ref 150.0–400.0)
RBC: 3.52 M/uL — ABNORMAL LOW (ref 3.87–5.11)
RDW: 14.1 % (ref 11.5–14.6)
Saturation Ratios: 19.7 % — ABNORMAL LOW (ref 20.0–50.0)
Transferrin: 318.8 mg/dL (ref 212.0–360.0)
Vitamin B-12: 177 pg/mL — ABNORMAL LOW (ref 211–911)
WBC: 4.2 10*3/microliter — ABNORMAL LOW (ref 4.5–10.5)

## 2009-09-02 ENCOUNTER — Ambulatory Visit: Payer: Self-pay | Admitting: Gastroenterology

## 2009-09-09 ENCOUNTER — Ambulatory Visit: Payer: Self-pay | Admitting: Gastroenterology

## 2009-09-10 ENCOUNTER — Ambulatory Visit: Payer: Self-pay | Admitting: Gastroenterology

## 2009-09-12 ENCOUNTER — Telehealth: Payer: Self-pay | Admitting: Endocrinology

## 2009-09-17 ENCOUNTER — Ambulatory Visit: Payer: Self-pay | Admitting: Gastroenterology

## 2009-10-01 ENCOUNTER — Encounter (INDEPENDENT_AMBULATORY_CARE_PROVIDER_SITE_OTHER): Payer: Self-pay | Admitting: *Deleted

## 2009-10-01 ENCOUNTER — Ambulatory Visit: Payer: Self-pay | Admitting: Gastroenterology

## 2009-10-01 DIAGNOSIS — R109 Unspecified abdominal pain: Secondary | ICD-10-CM | POA: Insufficient documentation

## 2009-10-01 DIAGNOSIS — R1310 Dysphagia, unspecified: Secondary | ICD-10-CM | POA: Insufficient documentation

## 2009-10-01 DIAGNOSIS — K902 Blind loop syndrome, not elsewhere classified: Secondary | ICD-10-CM

## 2009-10-02 LAB — CONVERTED CEMR LAB: Magnesium: 1.2 mg/dL — ABNORMAL LOW (ref 1.5–2.5)

## 2009-10-07 ENCOUNTER — Ambulatory Visit: Payer: Self-pay | Admitting: Gastroenterology

## 2009-10-09 ENCOUNTER — Encounter: Payer: Self-pay | Admitting: Gastroenterology

## 2009-10-18 ENCOUNTER — Ambulatory Visit: Payer: Self-pay | Admitting: Gastroenterology

## 2009-11-18 ENCOUNTER — Ambulatory Visit: Payer: Self-pay | Admitting: Gastroenterology

## 2009-11-25 ENCOUNTER — Telehealth: Payer: Self-pay | Admitting: Internal Medicine

## 2009-12-16 ENCOUNTER — Ambulatory Visit: Payer: Self-pay | Admitting: Gastroenterology

## 2010-01-29 ENCOUNTER — Telehealth: Payer: Self-pay | Admitting: Gastroenterology

## 2010-01-30 ENCOUNTER — Ambulatory Visit: Payer: Self-pay | Admitting: Gastroenterology

## 2010-01-31 ENCOUNTER — Encounter: Payer: Self-pay | Admitting: Gastroenterology

## 2010-01-31 ENCOUNTER — Ambulatory Visit: Payer: Self-pay | Admitting: Internal Medicine

## 2010-02-02 ENCOUNTER — Inpatient Hospital Stay (HOSPITAL_COMMUNITY): Admission: AD | Admit: 2010-02-02 | Discharge: 2010-02-04 | Payer: Self-pay | Admitting: Gastroenterology

## 2010-02-07 DIAGNOSIS — A0472 Enterocolitis due to Clostridium difficile, not specified as recurrent: Secondary | ICD-10-CM

## 2010-02-10 ENCOUNTER — Telehealth: Payer: Self-pay | Admitting: Gastroenterology

## 2010-02-11 ENCOUNTER — Ambulatory Visit: Payer: Self-pay | Admitting: Gastroenterology

## 2010-03-04 ENCOUNTER — Telehealth: Payer: Self-pay | Admitting: Gastroenterology

## 2010-03-06 ENCOUNTER — Telehealth (INDEPENDENT_AMBULATORY_CARE_PROVIDER_SITE_OTHER): Payer: Self-pay | Admitting: *Deleted

## 2010-03-06 ENCOUNTER — Encounter: Payer: Self-pay | Admitting: Physician Assistant

## 2010-03-14 ENCOUNTER — Ambulatory Visit: Payer: Self-pay | Admitting: Endocrinology

## 2010-03-20 ENCOUNTER — Ambulatory Visit: Payer: Self-pay | Admitting: Gastroenterology

## 2010-03-20 LAB — CONVERTED CEMR LAB
BUN: 10 mg/dL (ref 6–23)
CO2: 28 meq/L (ref 19–32)
Calcium: 9.6 mg/dL (ref 8.4–10.5)
Creatinine, Ser: 0.9 mg/dL (ref 0.4–1.2)
GFR calc non Af Amer: 84.91 mL/min (ref >60)
Glucose, Bld: 75 mg/dL (ref 70–99)

## 2010-05-01 ENCOUNTER — Encounter: Payer: Self-pay | Admitting: Endocrinology

## 2010-05-12 ENCOUNTER — Encounter: Payer: Self-pay | Admitting: Endocrinology

## 2010-05-15 ENCOUNTER — Encounter: Payer: Self-pay | Admitting: Endocrinology

## 2010-05-15 ENCOUNTER — Ambulatory Visit: Payer: Self-pay | Admitting: Endocrinology

## 2010-05-15 LAB — CONVERTED CEMR LAB
Albumin: 4.1 g/dL (ref 3.5–5.2)
Alkaline Phosphatase: 61 units/L (ref 39–117)
Basophils Absolute: 0 10*3/uL (ref 0.0–0.1)
Bilirubin, Direct: 0.2 mg/dL (ref 0.0–0.3)
CO2: 27 meq/L (ref 19–32)
Calcium: 9.4 mg/dL (ref 8.4–10.5)
Creatinine, Ser: 0.8 mg/dL (ref 0.4–1.2)
Eosinophils Absolute: 0 10*3/uL (ref 0.0–0.7)
Glucose, Bld: 78 mg/dL (ref 70–99)
HDL: 65 mg/dL (ref >39.00)
Lymphocytes Relative: 45 % (ref 12.0–46.0)
MCHC: 33.9 g/dL (ref 30.0–36.0)
Neutrophils Relative %: 47.6 % (ref 43.0–77.0)
Nitrite: NEGATIVE
Platelets: 207 10*3/uL (ref 150.0–400.0)
RDW: 14.3 % (ref 11.5–14.6)
Saturation Ratios: 32.3 % (ref 20.0–50.0)
Total Protein, Urine: NEGATIVE mg/dL
Transferrin: 322.4 mg/dL (ref 212.0–360.0)
Triglycerides: 112 mg/dL (ref 0.0–149.0)
Uric Acid, Serum: 5.2 mg/dL (ref 2.4–7.0)
pH: 5.5 (ref 5.0–8.0)

## 2010-06-01 HISTORY — PX: COLONOSCOPY: SHX174

## 2010-06-04 ENCOUNTER — Telehealth: Payer: Self-pay | Admitting: Endocrinology

## 2010-06-16 ENCOUNTER — Ambulatory Visit
Admission: RE | Admit: 2010-06-16 | Discharge: 2010-06-16 | Payer: Self-pay | Source: Home / Self Care | Attending: Endocrinology | Admitting: Endocrinology

## 2010-06-22 ENCOUNTER — Encounter: Payer: Self-pay | Admitting: Endocrinology

## 2010-07-01 NOTE — Assessment & Plan Note (Signed)
Summary: abdominal pain, wants more pain medication/lk    History of Present Illness Visit Type: Follow-up Visit Primary GI MD: Sheryn Bison MD FACP FAGA Primary Provider: [name], MD Chief Complaint: Percocet Refill, pt c/o righe side abdominal pain History of Present Illness:   PLEASANT 65 YO FEMALE WELL KNOWN TO DR. PATTERSON. SHE WAS RECENTLY HOSPITALIZED WITH ABDOMINAL PAIN, AND DIARRHEA AND HAD C.DIFF. SHE COULD NOT AFFORD VANCOMYCIN-SO WAS TREATED WITH FLORASTOR, AND METRONIDAZOLE ON RETURN VISIT WITH DR. PATTERSON 9/16. 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 COMES IN  TODAY FOR FOLLOW UP AND REFILL FOR PERCOCET. PAIN IS A LITTLE BETTER ON LEFT SIDE, AND USING LESS PERCOCET. HAVING 3-4 LOOSE STOOLS PER DAY, NO BLOOD,FINISHING METRONIDAZOLE,=. SHE WAS CALLED IN LEVBID THE OTHERDAY-NO BENEFIT ALSO MENTIONS FEELING DIZZY OFF AND ON RECENTLY,WONDERS IF HER BP IS LOW.   GI Review of Systems    Reports abdominal pain.     Location of  Abdominal pain: LLQ.    Denies acid reflux, belching, bloating, chest pain, dysphagia with liquids, dysphagia with solids, heartburn, loss of appetite, nausea, vomiting, vomiting blood, weight loss, and  weight gain.        Denies anal fissure, black tarry stools, change in bowel habit, constipation, diarrhea, diverticulosis, fecal incontinence, heme positive stool, hemorrhoids, irritable bowel syndrome, jaundice, light color stool, liver problems, rectal bleeding, and  rectal pain.    Current Medications (verified): 1)  Klor-Con 20 Meq  Pack (Potassium Chloride) .... Take 1 Three Times A Day By Mouth Qd 2)  Calcium 600/vitamin D 600-200 Mg-Unit  Tabs (Calcium Carbonate-Vitamin D) .... Take 1 By Mouth Qd 3)  Premarin 0.625 Mg  Tabs (Estrogens Conjugated) .... Take 1 By Mouth Qd 4)  Trazodone Hcl 150 Mg  Tabs (Trazodone Hcl)  .... Take 1po Qhs 5)  Promethazine Hcl 25 Mg  Tabs (Promethazine Hcl) .... Take 1 Tablet Every 6-8 Hours As Needed For Nausea 6)  Cozaar 50 Mg Tabs (Losartan Potassium) .Marland Kitchen.. 1 Tablet By Mouth Once Daily 7)  Furosemide 40 Mg Tabs (Furosemide) .... Take 1 By Mouth Qd 8)  Lovastatin 20 Mg  Tabs (Lovastatin) .... Take 1 By Mouth Qd 9)  Percocet 10-325 Mg Tabs (Oxycodone-Acetaminophen) .Marland Kitchen.. 1 Tab Every 4 Hrs As Needed For Pain 10)  Dexilant 60 Mg Cpdr (Dexlansoprazole) .Marland Kitchen.. 1 By Mouth Once Daily (Hold) 11)  Cyanocobalamin 1000 Mcg/ml Inj Soln (Cyanocobalamin) .... Inject One Ml Once A Month 12)  Slow-Mag 71.5-119 Mg Tbec (Magnesium Cl-Calcium Carbonate) .... 2 Tabs Daily 13)  Vitamin B-1 100 Mg Tabs (Thiamine Hcl) .... Daily 14)  Metronidazole 250 Mg Tabs (Metronidazole) .... Take One By Mouth Four Times A Day For 2 Wks, Then Three Times A Day For 1 Wk, Then One Time A Day For 1 Week. 15)  Florastor 250 Mg Caps (Saccharomyces Boulardii) .... Take One By Mouth Once Daily 16)  Clotrimazole 10 Mg Troc (Clotrimazole) .... One Five Timea A Day For One Week 17)  Levbid 0.375 Mg Xr12h-Tab (Hyoscyamine Sulfate) .... One Half To One By Mouth Two Times A Day As Needed  For Cramping  Allergies (verified): 1)  ! Morphine 2)  ! * Ivp Dye 3)  ! Aspirin  Past History:  Family History: Last updated: 06/29/2008 Family History of Esophageal Cancer:son Family History of Diabetes: mother and father Family History of Heart Disease: mother and father Family History of Colon  Cancer:Paternal Uncle Family History of Kidney Disease:Brothers, sister  Social History: Last updated: 06/12/2008 Patient is a former smoker.  Alcohol Use - no Daily Caffeine Use Illicit Drug Use - no Patient gets regular exercise.  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 DIFF COLITIS 9/11  Past Surgical History: Lysis Adhension/MULTIPLE Gallbladder Nephrectomy Vein Stripping Right leg Thyroid Ultrasound (12/1994 & 12/1995) EDG (07/15/2005) Rest Cardiolite (08/07/2003) Multiple SB resections Abdominal Hysterectomy w/BSO  Review of Systems  The patient denies allergy/sinus, anemia, anxiety-new, arthritis/joint pain, back pain, blood in urine, breast changes/lumps, change in vision, confusion, cough, coughing up blood, depression-new, fainting, fatigue, fever, headaches-new, hearing problems, heart murmur, heart rhythm changes, itching, menstrual pain, muscle pains/cramps, night sweats, nosebleeds, pregnancy symptoms, shortness of breath, skin rash, sleeping problems, sore throat, swelling of feet/legs, swollen lymph glands, thirst - excessive , urination - excessive , urination changes/pain, urine leakage, vision changes, and voice change.         SEE HPI  Vital Signs:  Patient profile:   65 year old female Height:      65 inches Weight:      210 pounds BMI:     35.07 BSA:     2.02 Pulse rate:   100 / minute Pulse rhythm:   regular BP sitting:   94 / 70  (left arm)  Vitals Entered By: Merri Ray CMA Duncan Dull) (March 06, 2010 10:07 AM)  Physical Exam  General:  Well developed, well nourished, no acute distress. Head:  Normocephalic and atraumatic. Eyes:  PERRLA, no icterus. Lungs:  Clear throughout to auscultation. Heart:  Regular rate and rhythm; no murmurs, rubs,  or bruits. Abdomen:  LARGE, SOFT, MILD TENDERNESS,LLQ-LMQ, NO GUARDING,BS+ Rectal:  NOT DONE Extremities:  No clubbing, cyanosis, edema or deformities noted. Neurologic:  Alert and  oriented  x4;  grossly normal neurologically. Psych:  Alert and cooperative. Normal mood and affect.   Impression & Recommendations:  Problem # 1:  ABDOMINAL PAIN-MULTIPLE SITES (ICD-789.09) Assessment Unchanged CHRONIC ABDOMINAL PAIN,SHORT BOWEL SYNDROME-POST MULTIPLE SURGERIES/LYSIS OF ADHESIONS,PERIODIC NARCOTIC USE   REFILL PERCOCET 10/325  ONE Q 6 HOURS AS NEEDED,#60 STOP LEVBID KEEP FOLLOW UP WITH DR. 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 Everardo All FOR FURTHER RECCOMENDATIONS.  Patient Instructions: 1)  Prescription for Percocet given to patient. 2)  Stop Levbid and Cozaar. 3)  Make a follow-up appt with Dr. Everardo All. 4)  Keep your appt with Dr. Jarold Motto on 03/20/10. 5)  Copy sent to : [name], MD 6)  The medication list was reviewed and reconciled.  All changed / newly prescribed medications were explained.  A complete medication list was provided to the patient / caregiver. Prescriptions: PERCOCET 10-325 MG TABS (OXYCODONE-ACETAMINOPHEN) one tablet by mouth every 6-8 hours as needed  #60 x 0   Entered by:   Christie Nottingham CMA (AAMA)   Authorized by:   Sammuel Cooper PA-c   Signed by:   Christie Nottingham  CMA (AAMA) on 03/06/2010   Method used:   Print then Give to Patient   RxID:   [id number]

## 2010-07-01 NOTE — Progress Notes (Signed)
Summary: Refill request  Phone Note Call from Patient Call back at Home Phone [phone]   Caller: Patient Walk In Summary of Call: Pt walked into clinic to request refills of Trazadone and Percocet Initial call taken by: Margaret Pyle, CMA,  September 12, 2009 11:17 AM  Follow-up for Phone Call        done Follow-up by: Minus Breeding MD,  September 12, 2009 12:33 PM  Additional Follow-up for Phone Call Additional follow up Details #1::        pt informed, Rx in cabinet for pt pick up Additional Follow-up by: Margaret Pyle, CMA,  September 12, 2009 1:27 PM    Prescriptions: TRAZODONE HCL 150 MG  TABS (TRAZODONE HCL) take 1po qhs  #90 x 3   Entered and Authorized by:   Minus Breeding MD   Signed by:   Minus Breeding MD on 09/12/2009   Method used:   Electronically to        [facility]  [address] [id number] (retail)       [address]       [place], [place]  [address]       Ph: [phone] or [phone]       Fax: [phone]   RxID:   [id number] PERCOCET 10-325 MG TABS (OXYCODONE-ACETAMINOPHEN) 1 tab every 4 hrs as needed for pain  #100 x 0   Entered and Authorized by:   Minus Breeding MD   Signed by:   Minus Breeding MD on 09/12/2009   Method used:   Print then Give to Patient   RxID:   [id number]

## 2010-07-01 NOTE — Assessment & Plan Note (Signed)
Summary: low blood pressure/dizziness/lb   Vital Signs:  Patient profile:   65 year old female Height:      65 inches (165.10 cm) Weight:      217.25 pounds (98.75 kg) BMI:     36.28 O2 Sat:      99 % on Room air Temp:     97.9 degrees F (36.61 degrees C) oral Pulse rate:   86 / minute BP sitting:   102 / 72  (left arm) Cuff size:   large  Vitals Entered By: Brenton Grills MA (March 14, 2010 2:29 PM)  O2 Flow:  Room air CC: Low BP/dizziness/aj Is Patient Diabetic? Yes Comments pt is no longer taking Levbid, Metronidazole, Dexilant, Cyanocobalamin or Premarin/aj   Referring Provider:  n/a Primary Provider:  [name], MD  CC:  Low BP/dizziness/aj.  History of Present Illness: pt states few weeks of slight lightheadedness feeling in the head, and assoc headache.  she has been off cozaar x a few weeks.  Current Medications (verified): 1)  Klor-Con 20 Meq  Pack (Potassium Chloride) .... Take 1 Three Times A Day By Mouth Qd 2)  Calcium 600/vitamin D 600-200 Mg-Unit  Tabs (Calcium Carbonate-Vitamin D) .... Take 1 By Mouth Qd 3)  Premarin 0.625 Mg  Tabs (Estrogens Conjugated) .... Take 1 By Mouth Qd 4)  Trazodone Hcl 150 Mg  Tabs (Trazodone Hcl) .... Take 1po Qhs 5)  Promethazine Hcl 25 Mg  Tabs (Promethazine Hcl) .... Take 1 Tablet Every 6-8 Hours As Needed For Nausea 6)  Cozaar 50 Mg Tabs (Losartan Potassium) .Marland Kitchen.. 1 Tablet By Mouth Once Daily 7)  Furosemide 40 Mg Tabs (Furosemide) .... Take 1 By Mouth Qd 8)  Lovastatin 20 Mg  Tabs (Lovastatin) .... Take 1 By Mouth Qd 9)  Percocet 10-325 Mg Tabs (Oxycodone-Acetaminophen) .... One Tablet By Mouth Every 6-8 Hours As Needed 10)  Dexilant 60 Mg Cpdr (Dexlansoprazole) .Marland Kitchen.. 1 By Mouth Once Daily (Hold) 11)  Cyanocobalamin 1000 Mcg/ml Inj Soln (Cyanocobalamin) .... Inject One Ml Once A Month 12)  Slow-Mag 71.5-119 Mg Tbec (Magnesium Cl-Calcium Carbonate) .... 2 Tabs Daily 13)  Vitamin B-1 100 Mg Tabs (Thiamine Hcl) .... Daily 14)   Metronidazole 250 Mg Tabs (Metronidazole) .... Take One By Mouth Four Times A Day For 2 Wks, Then Three Times A Day For 1 Wk, Then One Time A Day For 1 Week. 15)  Florastor 250 Mg Caps (Saccharomyces Boulardii) .... Take One By Mouth Once Daily 16)  Levbid 0.375 Mg Xr12h-Tab (Hyoscyamine Sulfate) .... One Half To One By Mouth Two Times A Day As Needed  For Cramping  Allergies (verified): 1)  ! Morphine 2)  ! * Ivp Dye 3)  ! 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 DIFF COLITIS 9/11  Review of Systems  The patient denies syncope.         no change in chronic diarrhea  Physical Exam  General:  obese.  no distress  Extremities:  no edema   Impression & Recommendations:  Problem # 1:  HYPERTENSION (ICD-401.9) overcontrolled  Other Orders: Est. Patient Level III (82956)  Patient Instructions: 1)  stop furosemide. 2)  Please schedule  a physical appointment in 2 months.

## 2010-07-01 NOTE — Initial Assessments (Signed)
Summary: Diarrhea/dfs    History of Present Illness Primary GI MD: Sheryn Bison MD FACP FAGA Primary Provider: [name], MD Chief Complaint: Starting last week pt had N/V after meals. Pt thought it was viral so she tried to let it run its coarse. Then this week pt started with diarrhea after meals. Pt now has N/V/D with generalized intermittant cramping abd pain with some right sided sharp pains. Pt states she has watery diarrhea with 5 BM's already today. History of Present Illness:   5 days of crampy abdominal pain with nausea and vomiting and diarrhea. She denies antibiotic use. Her pain does radiate into her right flank. 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 His been no known infectious disease exposure or sick family members at home. She has watery diarrhea and cannot keep any food or liquids down.   GI Review of Systems    Reports abdominal pain, loss of appetite, nausea, and  vomiting.     Location of  Abdominal pain: generalized.    Denies acid reflux, belching, bloating, chest pain, dysphagia with liquids, dysphagia with solids, heartburn, vomiting blood, weight loss, and  weight gain.      Reports change in bowel habits, diarrhea, hemorrhoids, and  rectal pain.     Denies anal fissure, black tarry stools, constipation, diverticulosis, fecal incontinence, heme positive stool, irritable bowel syndrome, jaundice, light color stool, liver problems, and  rectal bleeding.    Current Medications (verified): 1)  Klor-Con 20 Meq  Pack (Potassium Chloride) .... Take 1 Three Times A Day By Mouth Qd 2)  Calcium 600/vitamin D 600-200 Mg-Unit  Tabs (Calcium Carbonate-Vitamin D) .... Take 1 By Mouth Qd 3)  Premarin 0.625 Mg  Tabs (Estrogens Conjugated) .... Take 1 By Mouth Qd 4)  Trazodone Hcl 150 Mg  Tabs (Trazodone Hcl) .... Take 1po Qhs 5)  Promethazine Hcl 25 Mg  Tabs (Promethazine Hcl) .... Take 1 Tablet Every 6-8 Hours As  Needed For Nausea 6)  Cozaar 50 Mg Tabs (Losartan Potassium) .Marland Kitchen.. 1 Tablet By Mouth Once Daily 7)  Furosemide 40 Mg Tabs (Furosemide) .... Take 1 By Mouth Qd 8)  Lovastatin 20 Mg  Tabs (Lovastatin) .... Take 1 By Mouth Qd 9)  Percocet 10-325 Mg Tabs (Oxycodone-Acetaminophen) .Marland Kitchen.. 1 Tab Every 4 Hrs As Needed For Pain 10)  Dexilant 60 Mg Cpdr (Dexlansoprazole) .Marland Kitchen.. 1 By Mouth Qd 11)  Align   Caps (Misc Intestinal Flora Regulat) .... Take One Capsule By Mouth Daily 12)  Cyanocobalamin 1000 Mcg/ml Inj Soln (Cyanocobalamin) .... Inject One Ml Once A Month 13)  Slow-Mag 71.5-119 Mg Tbec (Magnesium Cl-Calcium Carbonate) .... 2 Tabs Daily 14)  Vitamin B-1 100 Mg Tabs (Thiamine Hcl) .... Daily  Allergies (verified): 1)  ! Morphine 2)  ! * Ivp Dye 3)  ! Aspirin 4)  ! Dilaudid (Hydromorphone Hcl)  Past History:  Past medical, surgical, family and social histories (including risk factors) reviewed for relevance to current acute and chronic problems.  Past Medical History: Reviewed history from 02/20/2009 and no changes required. 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 Lysis Adhension Gallbladder Nephrectomy Vein Stripping Right leg Thyroid Ultrasound (12/1994 &  12/1995) EDG (07/15/2005) Rest Cardiolite (08/07/2003) Multiple SB resections Abdominal Hysterectomy w/BSO  Family History: Reviewed history from 06/29/2008 and no changes required. Family History of Esophageal Cancer:son Family History of Diabetes: mother and father Family  History of Heart Disease: mother and father Family History of Colon Cancer:Paternal Uncle Family History of Kidney Disease:Brothers, sister  Social History: Reviewed history from 06/12/2008 and no changes required. Patient is a former smoker.  Alcohol Use - no Daily Caffeine Use Illicit Drug Use - no Patient gets regular exercise.  Review of Systems  The patient denies allergy/sinus, anemia, anxiety-new, arthritis/joint pain, back pain, blood in urine, breast changes/lumps, change in vision, confusion, cough, coughing up blood, depression-new, fainting, fatigue, fever, headaches-new, hearing problems, heart murmur, heart rhythm changes, itching, menstrual pain, muscle pains/cramps, night sweats, nosebleeds, pregnancy symptoms, shortness of breath, skin rash, sleeping problems, sore throat, swelling of feet/legs, swollen lymph glands, thirst - excessive , urination - excessive , urination changes/pain, urine leakage, vision changes, and voice change.    Vital Signs:  Patient profile:   65 year old female Height:      65 inches Weight:      208.38 pounds BMI:     34.80 Pulse rate:   100 / minute Pulse rhythm:   regular BP sitting:   110 / 78  (left arm) Cuff size:   regular  Vitals Entered By: Christie Nottingham CMA Duncan Dull) (January 30, 2010 11:05 AM)  Physical Exam  General:  Well developed, well nourished, no acute distress.uncomfortable but in no acute distress. Head:  Normocephalic and atraumatic. Eyes:  PERRLA, no icterus.exam deferred to patient's ophthalmologist.   Lungs:  Clear throughout to auscultation. Heart:  Regular rate and rhythm; no murmurs, rubs,  or bruits. Abdomen:  multiple well-healed surgical scars without any definite organomegaly, masses or localized tenderness. Bowel sounds are depressed. Rectal:  Normal exam.hemocult negative.   Extremities:  No clubbing, cyanosis, edema or deformities noted. Neurologic:  Alert and  oriented x4;  grossly normal  neurologically. Psych:  Alert and cooperative. Normal mood and affect.   Impression & Recommendations:  Problem # 1:  ABDOMINAL PAIN-MULTIPLE SITES (ICD-789.09) Assessment Deteriorated Her current pain with nausea and vomiting sounds like possible kidney stones. She is mildly dehydrated and we will arrange for her to receive IV fluids, check her labs, and perform CT scan through the emergency room department. She is a very difficult case to sort out clinically per her multiple meds and multiple abdominal adhesions. Other considerations would be exacerbation of C. difficile colitis. She is only mildly dehydrated but is not doing well as an outpatient.  Patient Instructions: 1)  we'll contact her physician's assistant and arrange for fluids, labs, and CT scan. She will probably need intravenous Phenergan and narcotic administration. Hopefully she can avoid prolong hospitalization. 2)  Copy sent to : Dr. Everardo All in primary care.

## 2010-07-01 NOTE — Assessment & Plan Note (Signed)
Summary: #1 of 3 weekly B12/266.2/dfs  Nurse Visit   Allergies: 1)  ! Morphine 2)  ! * Ivp Dye 3)  ! Aspirin 4)  ! Dilaudid (Hydromorphone Hcl)  Medication Administration  Injection # 1:    Medication: Vit B12 1000 mcg    Diagnosis: VITAMIN B12 DEFICIENCY (ICD-266.2)    Route: IM    Site: R deltoid    Exp Date: 11/12    Lot #: [id number]    Mfr: [employer]    Patient tolerated injection without complications    Given by: Milford Cage NCMA (September 02, 2009 2:16 PM)  Orders Added: 1)  Vit B12 1000 mcg [[id number]]

## 2010-07-01 NOTE — Assessment & Plan Note (Signed)
Summary: STOMACH CRAMPING...AS.    History of Present Illness Visit Type: Follow-up Visit Primary GI MD: Sheryn Bison MD FACP FAGA Primary Provider: [name], MD Chief Complaint: Patient complains of upper abdominal cramping for over a week, she state that the cramping has been worse since her dilation. Patient states that she has the same change in bowels that she always has from constipation to a slimey stool, she never has a regular BM.   History of Present Illness:   Continued intermittent solid food dysphagia despite a recent endoscopy and dilation. She takes daily Dexilant for GERD. She also complains of chronic gas, bloating, lower dominant discomfort, has had multiple surgical procedures and has associated short bowel syndrome. She takes regular Percocet for pain, and has recently been on probiotic therapy without improvement. She recently been diagnosed as having B12 deficiency and is on parenteral replacement therapy. She denies severe abdominal pain, nausea vomiting, fever, chills, bloody diarrhea et Karie Soda.   GI Review of Systems    Reports abdominal pain.     Location of  Abdominal pain: upper abdomen.    Denies acid reflux, belching, bloating, chest pain, dysphagia with liquids, dysphagia with solids, heartburn, loss of appetite, nausea, vomiting, vomiting blood, weight loss, and  weight gain.      Reports change in bowel habits.     Denies anal fissure, black tarry stools, constipation, diarrhea, diverticulosis, fecal incontinence, heme positive stool, hemorrhoids, irritable bowel syndrome, jaundice, light color stool, liver problems, rectal bleeding, and  rectal pain.    Current Medications (verified): 1)  Klor-Con 20 Meq  Pack (Potassium Chloride) .... Take 1 Three Times A Day By Mouth Qd 2)  Calcium 600/vitamin D 600-200 Mg-Unit  Tabs (Calcium Carbonate-Vitamin D) .... Take 1 By Mouth Qd 3)  Premarin 0.625 Mg  Tabs (Estrogens Conjugated) .... Take 1 By Mouth Qd 4)   Trazodone Hcl 150 Mg  Tabs (Trazodone Hcl) .... Take 1po Qhs 5)  Promethazine Hcl 25 Mg  Tabs (Promethazine Hcl) .... Take 1 Tablet Every 6-8 Hours As Needed For Nausea 6)  Cozaar 50 Mg Tabs (Losartan Potassium) .Marland Kitchen.. 1 Tablet By Mouth Once Daily 7)  Furosemide 40 Mg Tabs (Furosemide) .... Take 1 By Mouth Qd 8)  Lovastatin 20 Mg  Tabs (Lovastatin) .... Take 1 By Mouth Qd 9)  Percocet 10-325 Mg Tabs (Oxycodone-Acetaminophen) .Marland Kitchen.. 1 Tab Every 4 Hrs As Needed For Pain 10)  Dexilant 60 Mg Cpdr (Dexlansoprazole) .Marland Kitchen.. 1 By Mouth Qd 11)  Align   Caps (Misc Intestinal Flora Regulat) .... Take One Capsule By Mouth Daily 12)  Cyanocobalamin 1000 Mcg/ml Inj Soln (Cyanocobalamin) .... Inject One Ml Once A Month  Allergies (verified): 1)  ! Morphine 2)  ! * Ivp Dye 3)  ! Aspirin 4)  ! Dilaudid (Hydromorphone Hcl)  Past History:  Past medical, surgical, family and social histories (including risk factors) reviewed for relevance to current acute and chronic problems.  Past Medical History: Reviewed history from 02/20/2009 and no changes required. Muliple [phone]) HEME +, w/u - Smoker Obesity PUD (09/1994) Recurrent N/V Adhesion Pernious Anemia Leukopenia  INTERNAL HEMORRHOIDS (ICD-455.0) ESOPHAGEAL STRICTURE (ICD-530.3) HIATAL HERNIA (ICD-553.3) OSTEOARTHRITIS (ICD-715.90) HYPERTHYROIDISM (ICD-242.90) HYPERTENSION (ICD-401.9) HYPERLIPIDEMIA (ICD-272.4) GOUT (ICD-274.9) DIVERTICULOSIS, COLON (ICD-562.10) DIABETES MELLITUS, TYPE II (ICD-250.00)  Past Surgical History: Reviewed history from 09/14/2007 and no changes required. Lysis Adhension Gallbladder Nephrectomy Vein Stripping Right leg Thyroid Ultrasound (12/1994 & 12/1995) EDG (07/15/2005) Rest Cardiolite (08/07/2003) Multiple SB resections Abdominal Hysterectomy w/BSO  Family History: Reviewed  history from 06/29/2008 and no changes required. Family History of Esophageal Cancer:son Family History of Diabetes: mother and  father Family History of Heart Disease: mother and father Family History of Colon Cancer:Paternal Uncle Family History of Kidney Disease:Brothers, sister  Social History: Reviewed history from 06/12/2008 and no changes required. Patient is a former smoker.  Alcohol Use - no Daily Caffeine Use Illicit Drug Use - no Patient gets regular exercise.  Review of Systems  The patient denies allergy/sinus, anemia, anxiety-new, arthritis/joint pain, back pain, blood in urine, breast changes/lumps, change in vision, confusion, cough, coughing up blood, depression-new, fainting, fatigue, fever, headaches-new, hearing problems, heart murmur, heart rhythm changes, itching, menstrual pain, muscle pains/cramps, night sweats, nosebleeds, pregnancy symptoms, shortness of breath, skin rash, sleeping problems, sore throat, swelling of feet/legs, swollen lymph glands, thirst - excessive , urination - excessive , urination changes/pain, urine leakage, vision changes, and voice change.   ENT:  Complains of hoarseness; denies earache, ear discharge, tinnitus, decreased hearing, nasal congestion, loss of smell, nosebleeds, sore throat, and difficulty swallowing. CV:  Denies chest pains, angina, palpitations, syncope, dyspnea on exertion, orthopnea, PND, peripheral edema, and claudication. GI:  Complains of difficulty swallowing, abdominal pain, and change in bowel habits; denies pain on swallowing, nausea, indigestion/heartburn, vomiting, vomiting blood, jaundice, gas/bloating, diarrhea, constipation, bloody BM's, black BMs, and fecal incontinence. Psych:  Denies depression, anxiety, memory loss, suicidal ideation, hallucinations, paranoia, phobia, and confusion. Heme:  Denies bruising, bleeding, enlarged lymph nodes, and pagophagia.  Vital Signs:  Patient profile:   65 year old female Height:      65 inches Weight:      211.2 pounds BMI:     35.27 Pulse rate:   110 / minute Pulse rhythm:   regular BP sitting:    118 / 76  (left arm) Cuff size:   regular  Vitals Entered By: Harlow Mares CMA Duncan Dull) (Oct 01, 2009 11:20 AM)  Physical Exam  General:  Well developed, well nourished, no acute distress.healthy appearing.   Head:  Normocephalic and atraumatic. Eyes:  PERRLA, no icterus.exam deferred to patient's ophthalmologist.   Abdomen:  As before,"roadmap" appearing abdomen for multiple scars. Exam is difficult because of the deformity and obesity, but I cannot appreciate localized masses or tenderness. Bowel sounds are nonobstructed. Psych:  Alert and cooperative. Normal mood and affect.depressed affect.     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 I suspect her B12 deficiency is related to chronic bacterial overgrowth syndrome and adhesions. Orders: TLB-Magnesium (Mg) (83735-MG) T- * Misc. Laboratory test [phone])  Problem # 2:  DYSPHAGIA UNSPECIFIED (ICD-787.20) Assessment: Unchanged Repeat endoscopy and progressive dilations. Also continue reflux regime and daily PPI therapy. Serum magnesium level has been ordered per chronic PPI use. Orders: TLB-Magnesium (Mg) (83735-MG) T- * Misc. Laboratory test [phone])  Problem # 3:  VITAMIN B12 DEFICIENCY (ICD-266.2) Assessment: Improved  Problem # 4:  HYPOTHYROIDISM, POST-RADIATION (ICD-244.1) Assessment: Improved Followup and medications per Dr. Everardo All.  Problem # 5:  ABDOMINAL PAIN-MULTIPLE SITES (ICD-789.09) Assessment: Unchanged  Patient Instructions: 1)  Please go to the basement for lab work. 2)  Begin Xifaxin two times a day.  Samples are given. 3)  You are scheduled for a repeat endoscopy. 4)  The medication list was reviewed and reconciled.  All changed / newly prescribed medications were explained.  A complete medication list was provided to the patient /  caregiver. 5)  Copy sent to : Dr. Everardo All  in endocrinology. 6)  Please continue current medications.  7)  Upper Endoscopy brochure given.  8)  Upper Endoscopy with Dilatation brochure given.   Appended Document: STOMACH CRAMPING...AS.    Clinical Lists Changes  Medications: Added new medication of XIFAXAN 550 MG TABS (RIFAXIMIN) two times a day x 6 days Orders: Added new Test order of EGD (EGD) - Signed

## 2010-07-01 NOTE — Assessment & Plan Note (Signed)
Summary: MONTHLY B12 SHOT...LSW.  Nurse Visit   Allergies: 1)  ! Morphine 2)  ! * Ivp Dye 3)  ! Aspirin 4)  ! Dilaudid (Hydromorphone Hcl)  Medication Administration  Injection # 1:    Medication: Vit B12 1000 mcg    Diagnosis: VITAMIN B12 DEFICIENCY (ICD-266.2)    Route: IM    Site: L deltoid    Exp Date: 07/03/2011    Lot #: [id number]    Mfr: [employer]    Patient tolerated injection without complications    Given by: Lowry Ram NCMA (November 18, 2009 11:32 AM)  Orders Added: 1)  Vit B12 1000 mcg [[id number]]  We made appt for the patient for her next monthly B12 Injection on 12-16-09 at 10:15 AM.  Appointment card given to patient.

## 2010-07-01 NOTE — Procedures (Signed)
Summary: Flexible Sigmoidoscopy  Patient: [name] Note: All result statuses are Final unless otherwise noted.  Tests: (1) Flexible Sigmoidoscopy (FLX)  FLX Flexible Sigmoidoscopy                             DONE     [facility]     [address]     [place], [place]  [address]           FLEXIBLE SIGMOIDOSCOPY PROCEDURE REPORT           PATIENT:  [name], [name]  MR#:  [id number]     BIRTHDATE:  Aug 19, 1945, 63 yrs. old  GENDER:  female           ENDOSCOPIST:  Hedwig Morton. Juanda Chance, MD     Referred by:  [name]. Jarold Motto, M.D.           PROCEDURE DATE:  01/31/2010     PROCEDURE:  Flexible Sigmoidoscopy with biopsy     ASA CLASS:  Class III     INDICATIONS:  diarrhea, abnormal imaging CT scan earlier toda6y     shows diffuse edemal left colon to splenic flexure c/w acute     colitis           MEDICATIONS:   Versed 8 mg, Fentanyl 75 mcg           DESCRIPTION OF PROCEDURE:   After the risks benefits and     alternatives of the procedure were thoroughly explained, informed     consent was obtained.  Digital rectal exam was performed and     revealed no rectal masses.   The [id number] ([id number]) and [id number]     ([id number]) endoscope was introduced through the anus and advanced     to the descending colon, without limitations.  The quality of the     prep was fair.  The instrument was then slowly withdrawn as the     mucosa was fully examined.     <<PROCEDUREIMAGES>>           normal sigmoid. yellow/white stool adherent to the colon mucosa,     smells like C.Diff but able to wash it off Multiple biopsies were     obtained and sent to pathology (see image001, image002, and     image003).   Retroflexed views in the rectum revealed no     abnormalities.    The scope was then withdrawn from the patient     and the procedure terminated.           COMPLICATIONS:  None           ENDOSCOPIC IMPRESSION:     1) Normal sigmoid     r/o C.Diff diarrhea, mucosa  appears normal but the liquid     exudate may indicate psedomembrane, s/p biopsies     RECOMMENDATIONS:     since pt is allergic to Flagyl, will start Vancomycin 250 mg po     qid, Probiotics, Questran apart from all orher meds, stool studies     pending           REPEAT EXAM:  In 0 year(s) for.           ______________________________     Hedwig Morton. Juanda Chance, MD           CC:           n.     eSIGNED:  Hedwig Morton. Brodie at 01/31/2010 07:21 PM           Rudean Hitt, [id number]  Note: An exclamation mark (!) indicates a result that was not dispersed into the flowsheet. Document Creation Date: 01/31/2010 7:22 PM _______________________________________________________________________  (1) Order result status: Final Collection or observation date-time: 01/31/2010 19:15 Requested date-time:  Receipt date-time:  Reported date-time:  Referring Physician:   Ordering Physician: Lina Sar [phone]) Specimen Source:  Source: Launa Grill Order Number: [id number] Lab site:

## 2010-07-01 NOTE — Progress Notes (Signed)
Summary: Vanco to expensive.   Phone Note Outgoing Call Call back at [facility] Phone [phone]   Call placed by: Harlow Mares CMA Duncan Dull),  February 10, 2010 11:34 AM Call placed to: Patient Summary of Call: recieved a fax from the pharm that patients insurance will not cover the tablet form of Vanco, so I called [place] to find out how much the liquid would be and it is $149.75 for 7 days, Patient should have been on this since discharge from the hospital which was 02/04/2010 but has not taken it since discharge. She states that she is still having diarrhea the same as when she was admitted, but she can not afford the Vanco in either form. She is coming in for an office visit tomorrow to follow up after her hospital stay. She would like to know what she can take that is more cost effective for her? Initial call taken by: Harlow Mares CMA Duncan Dull),  February 10, 2010 11:37 AM  Follow-up for Phone Call        WILL ADDRESS TOMORROW Follow-up by: Mardella Layman MD FACG,  February 10, 2010 3:29 PM  Additional Follow-up for Phone Call Additional follow up Details #1::        patient advised Additional Follow-up by: Harlow Mares CMA Duncan Dull),  February 10, 2010 3:34 PM

## 2010-07-01 NOTE — Letter (Signed)
Summary: EGD Instructions  [name]  [address] [place], [place] [address]   Phone: [phone]  Fax: [phone]       [name]    1945/11/10    MRN: [id number]       Procedure Day /Date: Monday, 09/09/09     Arrival Time:  3:00     Procedure Time: 4:00     Location of Procedure:                    _ X _ [facility] (4th Floor)    PREPARATION FOR ENDOSCOPY   On Monday, 08/30/09 THE DAY OF THE PROCEDURE:  1.   No solid foods, milk or milk products are allowed after midnight the night before your procedure.  2.   Do not drink anything colored red or purple.  Avoid juices with pulp.  No orange juice.  3.  You may drink clear liquids until 2:00 , which is 2 hours before your procedure.                                                                                                CLEAR LIQUIDS INCLUDE: Water Jello Ice Popsicles Tea (sugar ok, no milk/cream) Powdered fruit flavored drinks Coffee (sugar ok, no milk/cream) Gatorade Juice: apple, white grape, white cranberry  Lemonade Clear bullion, consomm, broth Carbonated beverages (any kind) Strained chicken noodle soup Hard Candy   MEDICATION INSTRUCTIONS  Unless otherwise instructed, you should take regular prescription medications with a small sip of water as early as possible the morning of your procedure.                     OTHER INSTRUCTIONS  You will need a responsible adult at least 67 years of age to accompany you and drive you home.   This person must remain in the waiting room during your procedure.  Wear loose fitting clothing that is easily removed.  Leave jewelry and other valuables at home.  However, you may wish to bring a book to read or an iPod/MP3 player to listen to music as you wait for your procedure to start.  Remove all body piercing jewelry and leave at home.  Total time from sign-in until discharge is approximately 2-3  hours.  You should go home directly after your procedure and rest.  You can resume normal activities the day after your procedure.  The day of your procedure you should not:   Drive   Make legal decisions   Operate machinery   Drink alcohol   Return to work  You will receive specific instructions about eating, activities and medications before you leave.    The above instructions have been reviewed and explained to me by   _______________________    I fully understand and can verbalize these instructions _____________________________ Date _________

## 2010-07-01 NOTE — Progress Notes (Signed)
Summary: ? thrush  Medications Added CLOTRIMAZOLE 10 MG TROC (CLOTRIMAZOLE) One five timea a day for one week LEVBID 0.375 MG XR12H-TAB (HYOSCYAMINE SULFATE) One half to one by mouth two times a day as needed  for cramping       Phone Note Call from Patient Call back at [facility] Phone [phone]   Caller: Patient Call For: Dr. Jarold Motto Reason for Call: Talk to Nurse Summary of Call: Thinks the Flagyl is causing a thrush in her mouth Initial call taken by: Karna Christmas,  March 04, 2010 8:27 AM  Follow-up for Phone Call        Patient has been treated for c-diff with vanc, and now is on flagyl due to cost.  patient has about 1 week left of flagyl.  She c/o white coating on her tongue.  She denies any oral pain or sore throat.  She is also requesting something for cramping.  Dr Marina Goodell you are MD of the day , Please advise.   Follow-up by: Darcey Nora RN, CGRN,  March 04, 2010 9:30 AM  Additional Follow-up for Phone Call Additional follow up Details #1::        #1. Mycelex troche one 5 times daily for one week #2. Levbid 0.375 mg one half to one p.o. b.i.d. p.r.n. #3. Make sure no contraindications to medications #4. Follow up with Dr. Jarold Motto Additional Follow-up by: Hilarie Fredrickson MD,  March 04, 2010 10:20 AM    Additional Follow-up for Phone Call Additional follow up Details #2::    Patient  advised of Dr. Lamar Sprinkles recommendations. Rx sent to [facility] as per patient request. Patient  will keep appointment with Dr. Jarold Motto on 03/20/2010.  Follow-up by: Darcey Nora RN, CGRN,  March 04, 2010 10:52 AM  New/Updated Medications: CLOTRIMAZOLE 10 MG TROC (CLOTRIMAZOLE) One five timea a day for one week LEVBID 0.375 MG XR12H-TAB (HYOSCYAMINE SULFATE) One half to one by mouth two times a day as needed  for cramping Prescriptions: LEVBID 0.375 MG XR12H-TAB (HYOSCYAMINE SULFATE) One half to one by mouth two times a day as needed  for cramping  #60 x 1   Entered by:   Darcey Nora RN, CGRN   Authorized by:   Mardella Layman MD [facility]   Signed by:   Darcey Nora RN, CGRN on 03/04/2010   Method used:   Electronically to        [facility] [address] [id number] ([facility])       [address]       [place], [place]  [address]       Ph: [phone] or [phone]       Fax: [phone]   RxID:   [id number] CLOTRIMAZOLE 10 MG TROC (CLOTRIMAZOLE) One five timea a day for one week  #35 x 0   Entered by:   Darcey Nora RN, CGRN   Authorized by:   Mardella Layman MD [facility]   Signed by:   Darcey Nora RN, CGRN on 03/04/2010   Method used:   [facility]ically to        [facility] [address] [id number]       [address]       [place], [place]  [address]       Ph: [phone] or [phone]       Fax: [phone]   RxID:   [id number]

## 2010-07-01 NOTE — Assessment & Plan Note (Signed)
Summary: 1 MONTH FU    History of Present Illness Visit Type: Follow-up Visit Primary GI MD: Sheryn Bison MD FACP FAGA Primary Jimeka Balan: [name], MD Chief Complaint: Patient here for 1 month f/u. She states that she is still having abdominal pain (multiple sites) but not as badly as before. Her diarrhea has also improved. History of Present Illness:   This Patient has had resolution of her C. difficile colitis with prolonged metronidazole use and probiotic therapy. Her chronic abdominal pain associated with her multiple bowel surgeries is at its baseline level.her blood pressure was low and her Lasix was discontinued by Dr. Everardo All. She uses p.r.n. Percocet 2-3 tablets a day. She is on a variety of vitamin and mineral supplements.   GI Review of Systems    Reports abdominal pain, acid reflux, and  nausea.     Location of  Abdominal pain: generalized.    Denies belching, bloating, chest pain, dysphagia with liquids, dysphagia with solids, heartburn, loss of appetite, vomiting, vomiting blood, weight loss, and  weight gain.      Reports diverticulosis.     Denies anal fissure, black tarry stools, change in bowel habit, constipation, diarrhea, fecal incontinence, heme positive stool, hemorrhoids, irritable bowel syndrome, jaundice, light color stool, liver problems, rectal bleeding, and  rectal pain.    Current Medications (verified): 1)  Klor-Con 20 Meq  Pack (Potassium Chloride) .... Take 1 Three Times A Day By Mouth Qd 2)  Calcium 600/vitamin D 600-200 Mg-Unit  Tabs (Calcium Carbonate-Vitamin D) .... Take 1 By Mouth Qd 3)  Trazodone Hcl 150 Mg  Tabs (Trazodone Hcl) .... Take 1po Qhs 4)  Promethazine Hcl 25 Mg  Tabs (Promethazine Hcl) .... Take 1 Tablet Every 6-8 Hours As Needed For Nausea 5)  Lovastatin 20 Mg  Tabs (Lovastatin) .... Take 1 By Mouth Qd 6)  Percocet 10-325 Mg Tabs (Oxycodone-Acetaminophen) .... One Tablet By Mouth Every 6-8 Hours As Needed 7)  Cyanocobalamin 1000  Mcg/ml Inj Soln (Cyanocobalamin) .... Inject One Ml Once A Month 8)  Slow-Mag 71.5-119 Mg Tbec (Magnesium Cl-Calcium Carbonate) .... 2 Tabs Daily 9)  Vitamin B-1 100 Mg Tabs (Thiamine Hcl) .... Daily  Allergies (verified): 1)  ! Morphine 2)  ! * Ivp Dye 3)  ! Aspirin  Past History:  Past medical, surgical, family and social histories (including risk factors) reviewed for relevance to current acute and chronic problems.  Past Medical History: Reviewed history from 03/06/2010 and no changes required. Muliple [phone]) HEME +, w/u - Smoker Obesity PUD (09/1994) Recurrent N/V Adhesion Pernious Anemia Leukopenia CHRONIC ABDOMINAL PAIN SHORT BOWEL SYNDROME  INTERNAL HEMORRHOIDS (ICD-455.0) ESOPHAGEAL STRICTURE (ICD-530.3) HIATAL HERNIA (ICD-553.3) OSTEOARTHRITIS (ICD-715.90) HYPERTHYROIDISM (ICD-242.90) HYPERTENSION (ICD-401.9) HYPERLIPIDEMIA (ICD-272.4) GOUT (ICD-274.9) DIVERTICULOSIS, COLON (ICD-562.10) DIABETES MELLITUS, TYPE II (ICD-250.00) C. DIFF COLITIS 9/11  Past Surgical History: Lysis Adhension/MULTIPLE Cholecystectomy Vein Stripping Right leg Thyroid Ultrasound (12/1994 & 12/1995) EDG (07/15/2005) Rest Cardiolite (08/07/2003) Multiple SB resections Abdominal Hysterectomy w/BSO Lithotripsy  Family History: Reviewed history from 06/29/2008 and no changes required. Family History of Esophageal Cancer:son Family History of Diabetes: mother and father Family History of Heart Disease: mother and father Family History of Colon Cancer:Paternal Uncle Family History of Kidney Disease:Brothers, sister  Social History: Reviewed history from 06/12/2008 and no changes required. Patient is a former smoker. -stopped 40 years ago Alcohol Use - no Daily Caffeine Use-1 cup daily Illicit Drug Use - no Patient gets regular exercise.  Review of Systems       The patient  complains of allergy/sinus, arthritis/joint pain, back pain, fatigue, headaches-new, shortness of  breath, and sleeping problems.  The patient denies anemia, anxiety-new, blood in urine, breast changes/lumps, change in vision, confusion, cough, coughing up blood, depression-new, fainting, fever, hearing problems, heart murmur, heart rhythm changes, itching, menstrual pain, muscle pains/cramps, night sweats, nosebleeds, pregnancy symptoms, skin rash, sore throat, swelling of feet/legs, swollen lymph glands, thirst - excessive , urination - excessive , urination changes/pain, urine leakage, vision changes, and voice change.    Vital Signs:  Patient profile:   65 year old female Height:      65 inches Weight:      221.38 pounds BMI:     36.97 BSA:     2.07 Pulse rate:   76 / minute Pulse rhythm:   regular BP sitting:   124 / 88  (right arm)  Vitals Entered By: Lamona Curl CMA Duncan Dull) (March 20, 2010 8:42 AM)  Physical Exam  General:  Well developed, well nourished, no acute distress.healthy appearing.   Head:  Normocephalic and atraumatic. Eyes:  PERRLA, no icterus.exam deferred to patient's ophthalmologist.   Psych:  Alert and cooperative. Normal mood and affect.   Impression & Recommendations:  Problem # 1:  CLOSTRIDIUM DIFFICILE COLITIS (ICD-008.45) Assessment Improved Continue probiotic therapy for another month. She is to call should she have any relapse of her diarrhea. She also is to check with [place] should she have any reason for antibiotic administration.  Problem # 2:  ABDOMINAL PAIN-MULTIPLE SITES (ICD-789.09) Assessment: Unchanged Continue current medications. There is no evidence of opiate abuse. 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 Her Lasix was discontinued and she continues on p.o. potassium chloride. Serum electrolytes were ordered today. She has appointment with Dr. Everardo All  and primary care next week.  Patient Instructions: 1)  Copy sent to : [name], MD 2)  Continue Florastor one more month  3)  You were give a Percocet Rx today in the office.  4)  Please go to the basement today for your labs.  5)  The medication list was reviewed and reconciled.  All changed / newly prescribed medications were explained.  A complete medication list was provided to the patient / caregiver. Prescriptions: PERCOCET 10-325 MG TABS (OXYCODONE-ACETAMINOPHEN) one tablet by mouth every 6-8 hours as needed  #60 x 0   Entered by:   Harlow Mares CMA (AAMA)   Authorized by:   Mardella Layman MD [facility]   Signed by:   Harlow Mares CMA (AAMA) on 03/20/2010   Method used:   Print then Give to Patient   RxID:   [id number]   Appended Document: 1 MONTH FU    Nurse Visit   Allergies: 1)  ! Morphine 2)  ! * Ivp Dye 3)  ! Aspirin  Medication Administration  Injection # 1:    Medication: Vit B12 1000 mcg    Diagnosis: VITAMIN B12 DEFICIENCY (ICD-266.2)    Route: IM    Site: L deltoid    Exp Date: 11/2011    Lot #: [id number]    Mfr: American Regent    Patient tolerated injection without complications    Given by: Harlow Mares CMA (AAMA) (March 20, 2010 9:05 AM)  Orders Added: 1)  Vit B12 1000 mcg [J3420]

## 2010-07-01 NOTE — Letter (Signed)
Summary: Patient [facility] Biopsy Results  [facility]  [address] [place], [place] [address]   Phone: [phone]  Fax: [phone]        Oct 09, 2009 MRN: [id number]    [name] [address] [place], [place]  [address]    Dear Ms. Yard,  I am pleased to inform you that the biopsies taken during your recent endoscopic examination did not show any evidence of cancer upon pathologic examination.  Additional information/recommendations:  __No further action is needed at this time.  Please follow-up with      your primary care physician for your other healthcare needs.  __ Please call [phone] to schedule a return visit to review      your condition.  _XX_ Continue with the treatment plan as outlined on the day of your      exam.  __ You should have a repeat endoscopic examination for this problem              in _ months/years.   Please call us if you are having persistent problems or have questions about your condition that have not been fully answered at this time.  Sincerely,  Mardella Layman MD [facility]  This letter has been electronically signed by your physician.  Appended Document: Patient Notice-Endo Biopsy Results letter mailed

## 2010-07-01 NOTE — Miscellaneous (Signed)
  Medications Added PANTOPRAZOLE SODIUM 40 MG TBEC (PANTOPRAZOLE SODIUM) 1 two times a day       Clinical Lists Changes  Medications: Removed medication of PROTONIX 40 MG  TBEC (PANTOPRAZOLE SODIUM) take 1 by mouth bid Added new medication of PANTOPRAZOLE SODIUM 40 MG TBEC (PANTOPRAZOLE SODIUM) 1 two times a day - Signed Rx of PANTOPRAZOLE SODIUM 40 MG TBEC (PANTOPRAZOLE SODIUM) 1 two times a day;  #60 x 11;  Signed;  Entered by: Minus Breeding MD;  Authorized by: Minus Breeding MD;  Method used: [facility]  [address] [id number] [address] [place], [place], [place]  [address], Ph: [phone] or [phone], Fax: [phone]    Prescriptions: PANTOPRAZOLE SODIUM 40 MG TBEC (PANTOPRAZOLE SODIUM) 1 two times a day  #60 x 11   Entered and Authorized by:   Minus Breeding MD   Signed by:   Minus Breeding MD on 08/27/2009   Method used:   [facility]  [address] [id number]       [address]       [place], [place]  [address]       Ph: [phone] or [phone]       Fax: [phone]   RxID:   [id number]

## 2010-07-01 NOTE — Letter (Signed)
Summary: EGD Instructions  [facility]  [address] [place], [place] [address]   Phone: [phone]  Fax: [phone]       [name]    [birth year]    MRN: [id number]       Procedure Day /Date: Monday, 10/07/09     Arrival Time:  2:00     Procedure Time: 3:00     Location of Procedure:                    _ X _ [facility] (4th Floor)     PREPARATION FOR ENDOSCOPY   On 10/07/09  THE DAY OF THE PROCEDURE:  1.   No solid foods, milk or milk products are allowed after midnight the night before your procedure.  2.   Do not drink anything colored red or purple.  Avoid juices with pulp.  No orange juice.  3.  You may drink clear liquids until 1:00, which is 2 hours before your procedure.                                                                                                CLEAR LIQUIDS INCLUDE: Water Jello Ice Popsicles Tea (sugar ok, no milk/cream) Powdered fruit flavored drinks Coffee (sugar ok, no milk/cream) Gatorade Juice: apple, white grape, white cranberry  Lemonade Clear bullion, consomm, broth Carbonated beverages (any kind) Strained chicken noodle soup Hard Candy   MEDICATION INSTRUCTIONS  Unless otherwise instructed, you should take regular prescription medications with a small sip of water as early as possible the morning of your procedure.                       OTHER INSTRUCTIONS  You will need a responsible adult at least 66 years of age to accompany you and drive you home.   This person must remain in the waiting room during your procedure.  Wear loose fitting clothing that is easily removed.  Leave jewelry and other valuables at home.  However, you may wish to bring a book to read or an iPod/MP3 player to listen to music as you wait for your procedure to start.  Remove all body piercing jewelry and leave at home.  Total time from sign-in until discharge is approximately 2-3 hours.  You  should go home directly after your procedure and rest.  You can resume normal activities the day after your procedure.  The day of your procedure you should not:   Drive   Make legal decisions   Operate machinery   Drink alcohol   Return to work  You will receive specific instructions about eating, activities and medications before you leave.    The above instructions have been reviewed and explained to me by   _______________________    I fully understand and can verbalize these instructions _____________________________ Date _________

## 2010-07-01 NOTE — Assessment & Plan Note (Signed)
Summary: B12 INJ/3 OF 3/266.2/SP  Nurse Visit   Allergies: 1)  ! Morphine 2)  ! * Ivp Dye 3)  ! Aspirin 4)  ! Dilaudid (Hydromorphone Hcl)  Medication Administration  Injection # 1:    Medication: Vit B12 1000 mcg    Diagnosis: VITAMIN B12 DEFICIENCY (ICD-266.2)    Route: IM    Site: L deltoid    Exp Date: 2/13    Lot #: [id number]    Mfr: American Regent    Comments: pt to schedule monthly b 12 injection at front desk    Patient tolerated injection without complications    Given by: Chales Abrahams CMA Duncan Dull) (September 17, 2009 2:11 PM)  Orders Added: 1)  Vit B12 1000 mcg [J3420]

## 2010-07-01 NOTE — Assessment & Plan Note (Signed)
Summary: DYSPHAGIA...AS.    History of Present Illness Primary GI MD: Sheryn Bison MD FACP FAGA Primary Provider: [name], MD Chief Complaint: Solid food dysphagia with choking sensation. Pt states she has also a increase in gas. History of Present Illness:   65 year old African American female who has been a chronic patient of mine for many years for recurrent multiple surgical procedures related to initial diverticulitis with resultant recurrent bouts of intestinal obstruction. She has had chronic lower abdominal pain syndrome which is currently managed with trazodone 150 mg at bedtime and p.r.n. Percocet.  Her complaints today are worsening acid reflux despite taking Protonix 40 mg twice a day. She also has noted progressive solid food dysphagia in the retropharyngeal area. She has a chronic thyroid goiter this had radioactive I-131 treatment but continues to complain of pain of her thyroid and she is concerned that her current problems are related to her thyromegaly. This is followed closely by Dr. Everardo All in endocrinology.  Patient did have endoscopy with esophageal dilatation one a half years ago. She is up-to-date on her colonoscopy exams. Despite these complaints she's had no anorexia, weight loss, and denies hepatobiliary problems. She does have lactose intolerance and uses daily sorbitol and diet gum.   GI Review of Systems    Reports acid reflux, belching, chest pain, dysphagia with solids, and  nausea.      Denies bloating, dysphagia with liquids, heartburn, loss of appetite, vomiting, vomiting blood, weight loss, and  weight gain.        Denies anal fissure, black tarry stools, change in bowel habit, constipation, diarrhea, diverticulosis, fecal incontinence, heme positive stool, hemorrhoids, irritable bowel syndrome, jaundice, light color stool, liver problems, rectal bleeding, and  rectal pain.    Current Medications (verified): 1)  Klor-Con 20 Meq  Pack (Potassium  Chloride) .... Take 1 Three Times A Day By Mouth Qd 2)  Calcium 600/vitamin D 600-200 Mg-Unit  Tabs (Calcium Carbonate-Vitamin D) .... Take 1 By Mouth Qd 3)  Premarin 0.625 Mg  Tabs (Estrogens Conjugated) .... Take 1 By Mouth Qd 4)  Trazodone Hcl 150 Mg  Tabs (Trazodone Hcl) .... Take 1po Qhs 5)  Promethazine Hcl 25 Mg  Tabs (Promethazine Hcl) .... Take 1 Tablet Every 6-8 Hours As Needed For Nausea 6)  Cozaar 50 Mg Tabs (Losartan Potassium) .Marland Kitchen.. 1 Tablet By Mouth Once Daily 7)  Furosemide 40 Mg Tabs (Furosemide) .... Take 1 By Mouth Qd 8)  Lovastatin 20 Mg  Tabs (Lovastatin) .... Take 1 By Mouth Qd 9)  Triamcinolone Acetonide 0.1 % Crea (Triamcinolone Acetonide) .... Apply To Affceted Are Three Times A Day As Needed For Itch 10)  Ondansetron 4 Mg Tbdp (Ondansetron) .Marland Kitchen.. 1 Q4h Prn Nausea 11)  Percocet 10-325 Mg Tabs (Oxycodone-Acetaminophen) .Marland Kitchen.. 1 Tab Every 4 Hrs As Needed For Pain 12)  Pantoprazole Sodium 40 Mg Tbec (Pantoprazole Sodium) .Marland Kitchen.. 1 Two Times A Day  Allergies (verified): 1)  ! Morphine 2)  ! * Ivp Dye 3)  ! Aspirin 4)  ! Dilaudid (Hydromorphone Hcl)  Past History:  Past medical, surgical, family and social histories (including risk factors) reviewed for relevance to current acute and chronic problems.  Past Medical History: Reviewed history from 02/20/2009 and no changes required. Muliple [phone]) HEME +, w/u - Smoker Obesity PUD (09/1994) Recurrent N/V Adhesion Pernious Anemia Leukopenia  INTERNAL HEMORRHOIDS (ICD-455.0) ESOPHAGEAL STRICTURE (ICD-530.3) HIATAL HERNIA (ICD-553.3) OSTEOARTHRITIS (ICD-715.90) HYPERTHYROIDISM (ICD-242.90) HYPERTENSION (ICD-401.9) HYPERLIPIDEMIA (ICD-272.4) GOUT (ICD-274.9) DIVERTICULOSIS, COLON (ICD-562.10) DIABETES MELLITUS, TYPE II (  ICD-250.00)  Past Surgical History: Reviewed history from 09/14/2007 and no changes required. Lysis Adhension Gallbladder Nephrectomy Vein Stripping Right leg Thyroid Ultrasound (12/1994 &  12/1995) EDG (07/15/2005) Rest Cardiolite (08/07/2003) Multiple SB resections Abdominal Hysterectomy w/BSO  Family History: Reviewed history from 06/29/2008 and no changes required. Family History of Esophageal Cancer:son Family History of Diabetes: mother and father Family History of Heart Disease: mother and father Family History of Colon Cancer:Paternal Uncle Family History of Kidney Disease:Brothers, sister  Social History: Reviewed history from 06/12/2008 and no changes required. Patient is a former smoker.  Alcohol Use - no Daily Caffeine Use Illicit Drug Use - no Patient gets regular exercise.  Review of Systems  The patient denies allergy/sinus, anemia, anxiety-new, arthritis/joint pain, back pain, blood in urine, breast changes/lumps, change in vision, confusion, cough, coughing up blood, depression-new, fainting, fatigue, fever, headaches-new, hearing problems, heart murmur, heart rhythm changes, itching, menstrual pain, muscle pains/cramps, night sweats, nosebleeds, pregnancy symptoms, shortness of breath, skin rash, sleeping problems, sore throat, swelling of feet/legs, swollen lymph glands, thirst - excessive , urination - excessive , urination changes/pain, urine leakage, vision changes, and voice change.    Vital Signs:  Patient profile:   65 year old female Height:      65 inches Weight:      212.25 pounds BMI:     35.45 Pulse rate:   100 / minute Pulse rhythm:   regular BP sitting:   118 / 72  (left arm) Cuff size:   large  Vitals Entered By: Christie Nottingham CMA Duncan Dull) (August 30, 2009 10:37 AM)  Physical Exam  General:  Well developed, well nourished, no acute distress.healthy appearing.   Head:  Normocephalic and atraumatic. Eyes:  PERRLA, no icterus. Mouth:  No deformity or lesions, dentition normal. Neck:  I cannot appreciate thyromegaly, masses or tenderness in her neck area.. Abdomen:  Soft, nontender and nondistended. No masses, hepatosplenomegaly or  hernias noted. Normal bowel sounds. Cervical Nodes:  No significant cervical adenopathy. Psych:  Alert and cooperative. Normal mood and affect.   Impression & Recommendations:  Problem # 1:  HYPOTHYROIDISM, POST-RADIATION (ICD-244.1) Assessment Unchanged Followup as per Dr. Everardo All in endocrinology. I do not think her current symptomatology is related to thyromegaly  Problem # 2:  HOARSENESS (GYI-948.54) Assessment: Deteriorated Probable worsening GERD. We will repeat her endoscopy and dilation and switch her to Dexilant 60 mg q.a.m. stop Protonix. She may need esophageal manometry and 24-hour pH probe testing. Also consideration as to fundoplication surgery may need to be entertained  Problem # 3:  ANEMIA, IRON DEFICIENCY (ICD-280.9) Assessment: Unchanged Will repeat her CBC and anemia profile check iron levels and B12 level since there is some history of chronic B12 deficiency. She in past has been treated for recurrent bacterial overgrowth syndrome. I am not aware of any diagnosis of chronic pancreatitis, and she has been screened extensively for inflammatory bowel disease.  Problem # 4:  DIVERTICULOSIS, COLON (ICD-562.10) Assessment: Improved Avoid sorbitol/ fructose the major lactose products with trial of  probiotic therapy.  Other Orders: TLB-CBC Platelet - w/Differential (85025-CBCD) TLB-B12, Serum-Total ONLY (62703-J00) TLB-Ferritin (82728-FER) TLB-Folic Acid (Folate) (82746-FOL) TLB-IBC Pnl (Iron/FE;Transferrin) (83550-IBC)  Patient Instructions: 1)  Stop Pantoprazole. 2)  Begin Dexilant daily. 3)  Multimedia programmer daily. 4)  You are being scheduled for an endoscopy. 5)  The medication list was reviewed and reconciled.  All changed / newly prescribed medications were explained.  A complete medication list was provided to the patient / caregiver.  6)  repeat CBC and anemia profile 7)  Conscious Sedation brochure given.  8)  Upper Endoscopy with Dilatation brochure given.    Appended Document: DYSPHAGIA...AS. please copy Dr. Everardo All here....  Appended Document: DYSPHAGIA...AS.    Clinical Lists Changes  Medications: Changed medication from PANTOPRAZOLE SODIUM 40 MG TBEC (PANTOPRAZOLE SODIUM) 1 two times a day to DEXILANT 60 MG CPDR (DEXLANSOPRAZOLE) 1 by mouth qd - Signed Added new medication of ALIGN   CAPS (MISC INTESTINAL FLORA REGULAT) Take one capsule by mouth daily Rx of DEXILANT 60 MG CPDR (DEXLANSOPRAZOLE) 1 by mouth qd;  #30 x 6;  Signed;  Entered by: Ashok Cordia RN;  Authorized by: Mardella Layman MD [facility];  Method used: [facility]  [address] [id number] [address] [place], [place]  [address], Ph: [phone] or [phone], Fax: [phone] Orders: Added new Test order of EGD (EGD) - Signed    Prescriptions: DEXILANT 60 MG CPDR (DEXLANSOPRAZOLE) 1 by mouth qd  #30 x 6   Entered by:   Ashok Cordia RN   Authorized by:   Mardella Layman MD [facility]   Signed by:   Ashok Cordia RN on 08/30/2009   Method used:   [facility]        [facility]  [address] [id number]       [address]       [place], [place]  [address]       Ph: [phone] or [phone]       Fax: [phone]   RxID:   [id number]

## 2010-07-01 NOTE — Progress Notes (Signed)
Summary: Rx req/SAE  Phone Note Call from Patient Call back at Home Phone [phone]   Caller: Patient Summary of Call: Pt called requesting refills of Phenergan and Percocet Initial call taken by: Margaret Pyle, CMA,  November 25, 2009 9:53 AM  Follow-up for Phone Call        phenergan already done today escript; ok for percocet refil - done hardcopy to LIM side B - dahlia  Follow-up by: Corwin Levins MD,  November 25, 2009 12:08 PM  Additional Follow-up for Phone Call Additional follow up Details #1::        pt informed, Pain Rx in cabinet for pt pick up, Phenergan Rxd to pharmacy Additional Follow-up by: Margaret Pyle, CMA,  November 25, 2009 1:08 PM    Prescriptions: PERCOCET 10-325 MG TABS (OXYCODONE-ACETAMINOPHEN) 1 tab every 4 hrs as needed for pain  #100 x 0   Entered by:   Corwin Levins MD   Authorized by:   Minus Breeding MD   Signed by:   Corwin Levins MD on 11/25/2009   Method used:   Print then Give to Patient   RxID:   [id number] PROMETHAZINE HCL 25 MG  TABS (PROMETHAZINE HCL) Take 1 tablet every 6-8 hours as needed for nausea  #50 x 5   Entered by:   Margaret Pyle, CMA   Authorized by:   Minus Breeding MD   Signed by:   Margaret Pyle, CMA on 11/25/2009   Method used:   [facility]  [address] [id number]       [address]       [place], [place]  [address]       Ph: [phone] or [phone]       Fax: [phone]   RxID:   [id number]

## 2010-07-01 NOTE — Procedures (Signed)
Summary: Upper Endoscopy  Patient: [name] Note: All result statuses are Final unless otherwise noted.  Tests: (1) Upper Endoscopy (EGD)   EGD Upper Endoscopy       DONE     [facility]     [address]     [place], [place]  [address]           ENDOSCOPY PROCEDURE REPORT           PATIENT:  Rachel, [name]  MR#:  [id number]     BIRTHDATE:  January 24, 1946, 63 yrs. old  GENDER:  female           ENDOSCOPIST:  [name]. Jarold Motto, MD, [facility]     Referred by:           PROCEDURE DATE:  10/07/2009     PROCEDURE:  EGD with biopsy, Elease Hashimoto Dilation of Esophagus     ASA CLASS:  Class III     INDICATIONS:  dysphagia, abdominal pain, multiple sites           MEDICATIONS:   Fentanyl 100 mcg IV, Versed 10 mg IV     TOPICAL ANESTHETIC:  Exactacain Spray           DESCRIPTION OF PROCEDURE:   After the risks benefits and     alternatives of the procedure were thoroughly explained, informed     consent was obtained.  The LB GIF-H180 [id number] endoscope was     introduced through the mouth and advanced to the second portion of     the duodenum, without limitations.  The instrument was slowly     withdrawn as the mucosa was fully examined.     <<PROCEDUREIMAGES>>           A hiatal hernia was found. LARGE 6 CM HH.SEE PICTURES.  submucosal     nodule. MULTIPLE DISTALESOPHAGEAL STRICTURES.DILATED #47F MALONEY.     Normal duodenal folds were noted. SMALL BOWEL BIOPSY DONE.  The     stomach was entered and closely examined. The antrum, angularis,     and lesser curvature were well visualized, including a retroflexed     view of the cardia and fundus. The stomach wall was normally     distensable. The scope passed easily through the pylorus into the     duodenum.    Retroflexed views revealed a hiatal hernia.    The     scope was then withdrawn from the patient and the procedure     completed.           COMPLICATIONS:  None           ENDOSCOPIC IMPRESSION:     1) Hiatal  hernia     2) Submucosal nodule     3) Normal duodenal folds     4) Normal stomach     5) A hiatal hernia     1.CHRONIC GERD     2.RECURRENT PEPTIC STRICTURES.LARGE BORE DILATION PERFORMED     TODAY.     3.SHORT BOWEL SYNDROME.R/O CELIAC DISEASE COMPONENT PER CHRONIC     MALABSORPTION.     RECOMMENDATIONS:     1) Await biopsy results     2) Clear liquids until, then soft foods rest iof day. Resume     prior diet tomorrow.     3) continue current medications           REPEAT EXAM:  No           ______________________________  [name]. Jarold Motto, MD, [facility]           CC:  Minus Breeding, MD           n.     Rosalie DoctorMarland Kitchen   [name]. Patterson at 10/07/2009 03:42 PM           Rudean Hitt, [id number]  Note: An exclamation mark (!) indicates a result that was not dispersed into the flowsheet. Document Creation Date: 10/07/2009 3:43 PM _______________________________________________________________________  (1) Order result status: Final Collection or observation date-time: 10/07/2009 15:34 Requested date-time:  Receipt date-time:  Reported date-time:  Referring Physician:   Ordering Physician: Sheryn Bison [phone]) Specimen Source:  Source: Launa Grill Order Number: [id number] Lab site:

## 2010-07-01 NOTE — Progress Notes (Signed)
Summary: triage   Phone Note Call from Patient Call back at [phone]   Caller: Patient Call For: Dr. Jarold Motto Reason for Call: Talk to Nurse Summary of Call: diarrhea... no blood in stool... pt thinks she needs to be seen today Initial call taken by: Vallarie Mare,  January 29, 2010 8:36 AM  Follow-up for Phone Call        Pt c/o diarrhea for almost 2 weeks.  Slight nausea.  Would like OV.  Appt sch for pt to see Dr. Jarold Motto tomorrow. Follow-up by: Ashok Cordia RN,  January 29, 2010 8:52 AM

## 2010-07-01 NOTE — Medication Information (Signed)
Summary: [employer] [facility]  [employer]   Imported By: Sherian Rein 02/13/2010 10:48:55  _____________________________________________________________________  External Attachment:    Type:   Image     Comment:   External Document

## 2010-07-01 NOTE — Assessment & Plan Note (Signed)
Summary: Monthly B12 injection, 266.2  Nurse Visit   Allergies: 1)  ! Morphine 2)  ! * Ivp Dye 3)  ! Aspirin 4)  ! Dilaudid (Hydromorphone Hcl)  Medication Administration  Injection # 1:    Medication: Vit B12 1000 mcg    Diagnosis: VITAMIN B12 DEFICIENCY (ICD-266.2)    Route: IM    Site: L deltoid    Exp Date: 08/2011    Lot #: [id number]    Mfr: American Regent    Comments: pt to schedule next monthly b 12 at front desk    Patient tolerated injection without complications    Given by: Chales Abrahams CMA Duncan Dull) (December 16, 2009 9:56 AM)  Orders Added: 1)  Vit B12 1000 mcg [J3420]

## 2010-07-01 NOTE — Assessment & Plan Note (Signed)
Summary: b12 deficiency/bs 2 out of #3 weekly b12inj./bs  Nurse Visit   Medication Administration  Injection # 1:    Medication: Vit B12 1000 mcg    Diagnosis: VITAMIN B12 DEFICIENCY (ICD-266.2)    Route: IM    Site: L deltoid    Exp Date: 07/2011    Lot #: [id number]    Mfr: American Regent    Comments: pt will return on 09/17/09 for next injection    Patient tolerated injection without complications    Given by: Francee Piccolo CMA Duncan Dull) (September 10, 2009 2:18 PM)  Orders Added: 1)  Vit B12 1000 mcg [J3420]

## 2010-07-01 NOTE — Procedures (Signed)
Summary: Upper Endoscopy  Patient: [name] Note: All result statuses are Final unless otherwise noted.  Tests: (1) Upper Endoscopy (EGD)   EGD Upper Endoscopy       DONE     [facility]     [address]     [place], [place]  [address]           ENDOSCOPY PROCEDURE REPORT           PATIENT:  [name], [name]  MR#:  [id number]     BIRTHDATE:  16-Jan-1946, 63 yrs. old  GENDER:  female           ENDOSCOPIST:  [name]. Jarold Motto, MD, [facility]     Referred by:           PROCEDURE DATE:  09/09/2009     PROCEDURE:  EGD, diagnostic, Maloney Dilation of Esophagus     ASA CLASS:  Class III     INDICATIONS:  dysphagia           MEDICATIONS:   Fentanyl 50 mcg IV, Versed 7 mg     TOPICAL ANESTHETIC:  Exactacain Spray           DESCRIPTION OF PROCEDURE:   After the risks benefits and     alternatives of the procedure were thoroughly explained, informed     consent was obtained.  The LB GIF-H180 [id number] endoscope was     introduced through the mouth and advanced to the second portion of     the duodenum, without limitations.  The instrument was slowly     withdrawn as the mucosa was fully examined.     <<PROCEDUREIMAGES>>           A stricture was found. multiple strictures in distal esophagus and     5cm. HH noted.dilated #52 F maloney dilator.  A hiatal hernia was     found. 5cm prolapsing hh noted.  Normal duodenal folds were noted.     The stomach was entered and closely examined. The antrum,     angularis, and lesser curvature were well visualized, including a     retroflexed view of the cardia and fundus. The stomach wall was     normally distensable. The scope passed easily through the pylorus     into the duodenum.    Retroflexed views revealed a hiatal hernia.     The scope was then withdrawn from the patient and the procedure     completed.           COMPLICATIONS:  None           ENDOSCOPIC IMPRESSION:     1) Stricture     2) Hiatal hernia     3) Normal  duodenal folds     4) Normal stomach     CHRONIC GERD AND SECONDARY STRICTURES DILATED.     RECOMMENDATIONS:     1) Clear liquids until, then soft foods rest iof day. Resume     prior diet tomorrow.     2) continue PPI           REPEAT EXAM:  No           ______________________________     [name]. Jarold Motto, MD, [facility]           CC:  Minus Breeding, MD           n.     Rosalie DoctorMarland Kitchen   [name]. Patterson at 09/09/2009 04:36 PM  Mashell, Sieben, [phone]  Note: An exclamation mark (!) indicates a result that was not dispersed into the flowsheet. Document Creation Date: 09/09/2009 4:36 PM _______________________________________________________________________  (1) Order result status: Final Collection or observation date-time: 09/09/2009 16:28 Requested date-time:  Receipt date-time:  Reported date-time:  Referring Physician:   Ordering Physician: Sheryn Bison [phone]) Specimen Source:  Source: Launa Grill Order Number: [id number] Lab site:

## 2010-07-01 NOTE — Progress Notes (Signed)
Summary: med request   Phone Note Call from Patient Call back at Home Phone [phone]   Caller: Patient Call For: Dr. Jarold Motto Reason for Call: Talk to Nurse Summary of Call: would like pain meds for abd cramping... [facility] [address] Initial call taken by: Vallarie Mare,  March 06, 2010 8:27 AM  Follow-up for Phone Call        patient complains that she started the Levbid and Clotrimazole and she is stil having the same LLQ pain that she always had and she wants more pain meds, she was given percocet in the hospital and they worked well. She was also given Percocet #60 by Dr. Jarold Motto at her last office visit. Per Lavonna Rua who spoke to the patient on 10/4, if the patient is in pain and requires Percocet she needs to be seen in the office and the decision for pain meds will be made then. I have Left a message with the patients husband to have her call me back.  Follow-up by: Harlow Mares CMA Duncan Dull),  March 06, 2010 9:04 AM  Additional Follow-up for Phone Call Additional follow up Details #1::        I advised the patient that I spoke tih Lavonna Rua and that on 10/4 dr Marina Goodell was not willing to refill the Percocet and if she is having severe abdominal pain she would have to have an office visit with Mike Gip, PA appt made for 10am today.  Additional Follow-up by: Harlow Mares CMA Duncan Dull),  March 06, 2010 9:27 AM

## 2010-07-01 NOTE — Assessment & Plan Note (Signed)
Summary: 6 WEEK FOLLOW UP-LB  / RS'D SOONER PER PT/NWS   Vital Signs:  Patient profile:   65 year old female Height:      65 inches (165.10 cm) Weight:      205.13 pounds (93.24 kg) O2 Sat:      98 % on Room air Temp:     98.2 degrees F (36.78 degrees C) oral Pulse rate:   95 / minute BP sitting:   118 / 86  (left arm) Cuff size:   large  Vitals Entered By: Josph Macho RMA (August 13, 2009 2:41 PM)  O2 Flow:  Room air CC: 6 week follow up/ pt has concerns of "spots" on right hip, and a lump on right inner thigh/ CF Is Patient Diabetic? Yes   Primary Provider:  [name], MD  CC:  6 week follow up/ pt has concerns of "spots" on right hip and and a lump on right inner thigh/ CF.  History of Present Illness: pt states few mos of moderate pain at the right hip, and associated rash.  she says she is not allergic to codeine--"i just get a rash when i am on it for a long time." no cbg record, but states cbg's are well-controlled. 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     Current Medications (verified): 1)  Klor-Con 20 Meq  Pack (Potassium Chloride) .... Take 1 Three Times A Day By Mouth Qd 2)  Calcium 600/vitamin D 600-200 Mg-Unit  Tabs (Calcium Carbonate-Vitamin D) .... Take 1 By Mouth Qd 3)  Premarin 0.625 Mg  Tabs (Estrogens Conjugated) .... Take 1 By Mouth Qd 4)  Trazodone Hcl 150 Mg  Tabs (Trazodone Hcl) .... Take 1po Qhs 5)  Protonix 40 Mg  Tbec (Pantoprazole Sodium) .... Take 1 By Mouth Bid 6)  Promethazine Hcl 25 Mg  Tabs (Promethazine Hcl) .... Take 1 Tablet Every 6-8 Hours As Needed For Nausea 7)  Cozaar 50 Mg Tabs (Losartan Potassium) .Marland Kitchen.. 1 Tablet By Mouth Once Daily 8)  Furosemide 40 Mg Tabs (Furosemide) .... Take 1 By Mouth Qd 9)  Lovastatin 20 Mg  Tabs (Lovastatin) .... Take 1 By Mouth Qd 10)  Triamcinolone Acetonide 0.1 % Crea (Triamcinolone Acetonide) .... Apply To Affceted Are Three Times A Day As Needed For  Itch 11)  Ondansetron 4 Mg Tbdp (Ondansetron) .Marland Kitchen.. 1 Q4h Prn Nausea 12)  Tramadol-Acetaminophen 37.5-325 Mg Tabs (Tramadol-Acetaminophen) .Marland Kitchen.. 1 Q4h As Needed Pain  Allergies (verified): 1)  ! Morphine 2)  ! * Ivp Dye 3)  ! Aspirin 4)  ! 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         she denies any other rash  Physical Exam  General:  obese.  no distress  Skin:  right hip: has 1 cm hyperpigmented area on the right hip.  only a minimal break in the skin at the right hip. 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 1 tab 5x a day 2)  Percocet 10-325 Mg Tabs (Oxycodone-acetaminophen) .Marland Kitchen.. 1 tab every 4 hrs as needed for pain  Other Orders: T- * Misc. Laboratory test [phone]) TLB-TSH (Thyroid Stimulating Hormone) (84443-TSH) TLB-A1C / Hgb A1C (Glycohemoglobin) (83036-A1C) Est. Patient Level IV ([address])  Patient Instructions: 1)  check virus culture for possible shingles. 2)  tests are being ordered for you today.  a few days after the test(s), please  call [phone] to hear your test results. 3)  acyclovir 800 mg 5x a day. 4)  percocet 10/325 1 every 4 hrs as needed for pain. 5)  you should consider having the thyroid ultrasound.  please let me know if you decide to have it. 6)  blood test today. 7)  please consider an appointment for a physical.   8)  (update: i left message on phone-tree:  tsh is minimally abnormal, so you don't need medication now). Prescriptions: PERCOCET 10-325 MG TABS (OXYCODONE-ACETAMINOPHEN) 1 tab every 4 hrs as needed for pain  #50 x 0   Entered and Authorized by:   Minus Breeding MD   Signed by:   Minus Breeding MD on 08/13/2009   Method used:   Print then Give to Patient   RxID:   [id number] ACYCLOVIR 800 MG TABS (ACYCLOVIR) 1 tab 5x a day  #35 x 0   Entered and Authorized by:   Minus Breeding MD   Signed by:   Minus Breeding MD on 08/13/2009   Method used:   [facility]  [address] [id number]       [address]       [place], [place]  [address]       Ph: [phone] or [phone]       Fax: [phone]   RxID:   [id number]

## 2010-07-01 NOTE — Assessment & Plan Note (Signed)
Summary: HOSP F.U C-DIF COLITIS....EM   History of Present Illness Visit Type: Follow-up Visit Primary GI MD: Sheryn Bison MD FACP FAGA Primary [place] Police: [name], MD Chief Complaint: pt is still having abdominal cramping and 5-6 loose bowel movements per day History of Present Illness:   Her Hospitalization revealed C. difficile colitis and she responded well to p.o. Vancomycin and was discharged home on oral Vancomycin but has not taken this medication because of expense. She continues with diarrhea but no rectal bleeding. Otherwise she is status quo.   GI Review of Systems    Reports abdominal pain.      Denies acid reflux, belching, bloating, chest pain, dysphagia with liquids, dysphagia with solids, heartburn, loss of appetite, nausea, vomiting, vomiting blood, weight loss, and  weight gain.      Reports diarrhea.     Denies anal fissure, black tarry stools, change in bowel habit, constipation, diverticulosis, fecal incontinence, heme positive stool, hemorrhoids, irritable bowel syndrome, jaundice, light color stool, liver problems, rectal bleeding, and  rectal pain.    Current Medications (verified): 1)  Klor-Con 20 Meq  Pack (Potassium Chloride) .... Take 1 Three Times A Day By Mouth Qd 2)  Calcium 600/vitamin D 600-200 Mg-Unit  Tabs (Calcium Carbonate-Vitamin D) .... Take 1 By Mouth Qd 3)  Premarin 0.625 Mg  Tabs (Estrogens Conjugated) .... Take 1 By Mouth Qd 4)  Trazodone Hcl 150 Mg  Tabs (Trazodone Hcl) .... Take 1po Qhs 5)  Promethazine Hcl 25 Mg  Tabs (Promethazine Hcl) .... Take 1 Tablet Every 6-8 Hours As Needed For Nausea 6)  Cozaar 50 Mg Tabs (Losartan Potassium) .Marland Kitchen.. 1 Tablet By Mouth Once Daily 7)  Furosemide 40 Mg Tabs (Furosemide) .... Take 1 By Mouth Qd 8)  Lovastatin 20 Mg  Tabs (Lovastatin) .... Take 1 By Mouth Qd 9)  Percocet 10-325 Mg Tabs (Oxycodone-Acetaminophen) .Marland Kitchen.. 1 Tab Every 4 Hrs As Needed For Pain 10)  Dexilant 60 Mg Cpdr (Dexlansoprazole) .Marland Kitchen.. 1  By Mouth Qd 11)  Cyanocobalamin 1000 Mcg/ml Inj Soln (Cyanocobalamin) .... Inject One Ml Once A Month 12)  Slow-Mag 71.5-119 Mg Tbec (Magnesium Cl-Calcium Carbonate) .... 2 Tabs Daily 13)  Vitamin B-1 100 Mg Tabs (Thiamine Hcl) .... Daily  Allergies (verified): 1)  ! Morphine 2)  ! * Ivp Dye 3)  ! Aspirin  Past History:  Past medical, surgical, family and social histories (including risk factors) reviewed for relevance to current acute and chronic problems.  Past Medical History: Reviewed history from 02/20/2009 and no changes required. 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 Lysis Adhension Gallbladder Nephrectomy Vein Stripping Right leg Thyroid Ultrasound (12/1994 & 12/1995) EDG (07/15/2005) Rest Cardiolite (08/07/2003) Multiple SB resections Abdominal Hysterectomy w/BSO  Family History: Reviewed history from 06/29/2008 and no changes required. Family History of Esophageal Cancer:son Family History of Diabetes: mother and father Family History of Heart Disease: mother and father Family History of Colon Cancer:Paternal Uncle Family History of Kidney Disease:Brothers, sister  Social History: Reviewed history from 06/12/2008 and no changes required. Patient is a former smoker.  Alcohol Use - no Daily Caffeine Use Illicit Drug Use - no Patient gets regular exercise.  Review of Systems  The patient denies allergy/sinus,  anemia, anxiety-new, arthritis/joint pain, back pain, blood in urine, breast changes/lumps, change  in vision, confusion, cough, coughing up blood, depression-new, fainting, fatigue, fever, headaches-new, hearing problems, heart murmur, heart rhythm changes, itching, menstrual pain, muscle pains/cramps, night sweats, nosebleeds, pregnancy symptoms, shortness of breath, skin rash, sleeping problems, sore throat, swelling of feet/legs, swollen lymph glands, thirst - excessive , urination - excessive , urination changes/pain, urine leakage, vision changes, and voice change.    Vital Signs:  Patient profile:   65 year old female Height:      65 inches Weight:      213 pounds BMI:     35.57 Pulse rate:   84 / minute Pulse rhythm:   regular BP sitting:   108 / 80  (left arm) Cuff size:   large  Vitals Entered By: Francee Piccolo CMA Duncan Dull) (February 11, 2010 10:06 AM)  Physical Exam  General:  Well developed, well nourished, no acute distress.healthy appearing.  healthy appearing.   Head:  Normocephalic and atraumatic. Eyes:  PERRLA, no icterus.exam deferred to patient's ophthalmologist.  exam deferred to patient's ophthalmologist.   Psych:  Alert and cooperative. Normal mood and affect.   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 for one week, then daily for one week with office followup in one month's time. We have also suggested daily Florstar probiotic therapy. If she fails mitomycin we have spoken with her insurance company to pre-certify oral vancomycin. 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  Problem # 2:  ABDOMINAL PAIN-MULTIPLE SITES (ICD-789.09) Assessment: Unchanged Renewe Percocet her request.   Other Orders: Vit B12 1000 mcg  (D7824)  Patient Instructions: 1)  Copy sent to : [name] 2)  Your prescription(s) have been sent to you pharmacy.  3)  Take florastor samples provided once a day.  4)  The medication list was reviewed and reconciled.  All changed / newly prescribed medications were explained.  A complete medication list was provided to the patient / caregiver. Prescriptions: METRONIDAZOLE 250 MG TABS (METRONIDAZOLE) take one by mouth four times a day for 2 wks, then three times a day for 1 wk, then one time a day for 1 week.  #42 x 0   Entered by:   Harlow Mares CMA (AAMA)   Authorized by:   Mardella Layman MD [facility]   Signed by:   Harlow Mares CMA (AAMA) on 02/11/2010   Method used:   [facility]  [address] [id number]       [address]       [place], [place]  [address]       Ph: [phone] or [phone]       Fax: [phone]   RxID:   [id number] PERCOCET 10-325 MG TABS (OXYCODONE-ACETAMINOPHEN) 1 tab every 4 hrs as needed for pain  #60 x 0   Entered by:   Harlow Mares CMA (AAMA)   Authorized by:   Mardella Layman MD [facility]   Signed by:   Harlow Mares CMA (AAMA) on 02/11/2010   Method used:   Print then Give to Patient   RxID:  [id number]    Medication Administration  Injection # 1:    Medication: Vit B12 1000 mcg    Diagnosis: VITAMIN B12 DEFICIENCY (ICD-266.2)    Route: IM    Site: R deltoid    Exp Date: 11/2011    Lot #: [id number]    Mfr: American Regent    Patient tolerated injection without complications    Given by: Harlow Mares CMA (AAMA) (February 11, 2010 10:25 AM)  Orders Added: 1)  Vit B12 1000 mcg [J3420]

## 2010-07-01 NOTE — Assessment & Plan Note (Signed)
Summary: MONTHLY B12 SHOT....LSW.  Nurse Visit   Allergies: 1)  ! Morphine 2)  ! * Ivp Dye 3)  ! Aspirin 4)  ! Dilaudid (Hydromorphone Hcl)  Medication Administration  Injection # 1:    Medication: Vit B12 1000 mcg    Diagnosis: VITAMIN B12 DEFICIENCY (ICD-266.2)    Route: IM    Site: L deltoid    Exp Date: 2/13    Lot #: [id number]    Mfr: [employer] Regent    Patient tolerated injection without complications    Given by: Lamona Curl CMA (AAMA) (Oct 18, 2009 9:44 AM)  Orders Added: 1)  Vit B12 1000 mcg [J3420]

## 2010-07-01 NOTE — Assessment & Plan Note (Signed)
Summary: 6 MO ROV /NWS $50   Vital Signs:  Patient profile:   65 year old female Height:      65 inches (165.10 cm) Weight:      205.38 pounds (93.35 kg) O2 Sat:      94 % on Room air Temp:     97.6 degrees F (36.44 degrees C) oral Pulse rate:   84 / minute BP sitting:   122 / 84  (left arm) Cuff size:   large  Vitals Entered By: Josph Macho RMA (July 17, 2009 10:59 AM)  O2 Flow:  Room air CC: 6 month follow up/ CF Is Patient Diabetic? Yes   Primary Provider:  [name], MD  CC:  6 month follow up/ CF.  History of Present Illness: pt states few weeks of moderate hoarseness, but no associated pain in the throat. she is 5 mos s/p i-131 rx for hyperthyroidism due to multinodular goiter.  Current Medications (verified): 1)  Klor-Con 20 Meq  Pack (Potassium Chloride) .... Take 1 Three Times A Day By Mouth Qd 2)  Calcium 600/vitamin D 600-200 Mg-Unit  Tabs (Calcium Carbonate-Vitamin D) .... Take 1 By Mouth Qd 3)  Premarin 0.625 Mg  Tabs (Estrogens Conjugated) .... Take 1 By Mouth Qd 4)  Trazodone Hcl 150 Mg  Tabs (Trazodone Hcl) .... Take 1po Qhs 5)  Protonix 40 Mg  Tbec (Pantoprazole Sodium) .... Take 1 By Mouth Bid 6)  Promethazine Hcl 25 Mg  Tabs (Promethazine Hcl) .... Take 1 Tablet Every 6-8 Hours As Needed For Nausea 7)  Cozaar 50 Mg Tabs (Losartan Potassium) .Marland Kitchen.. 1 Tablet By Mouth Once Daily 8)  Furosemide 40 Mg Tabs (Furosemide) .... Take 1 By Mouth Qd 9)  Lovastatin 20 Mg  Tabs (Lovastatin) .... Take 1 By Mouth Qd 10)  Triamcinolone Acetonide 0.1 % Crea (Triamcinolone Acetonide) .... Apply To Affceted Are Three Times A Day As Needed For Itch 11)  Ondansetron 4 Mg Tbdp (Ondansetron) .Marland Kitchen.. 1 Q4h Prn Nausea 12)  Tramadol-Acetaminophen 37.5-325 Mg Tabs (Tramadol-Acetaminophen) .Marland Kitchen.. 1 Q4h As Needed Pain  Allergies (verified): 1)  ! Morphine 2)  ! * Ivp Dye 3)  ! Aspirin 4)  ! Dilaudid (Hydromorphone Hcl) 5)  ! 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    Physical Exam  General:  obese.  no distress  Neck:  small multinodular goiter, right > left Additional Exam:   FastTSH                   1.77 uIU/mL    Impression & Recommendations:  Problem # 1:  HYPERTHYROIDISM (ICD-242.90) Assessment Improved  Other Orders: Vit B12 1000 mcg (J3420) Admin of Therapeutic Inj  intramuscular or subcutaneous (14782) TLB-TSH (Thyroid Stimulating Hormone) (84443-TSH) Est. Patient Level III (95621)  Patient Instructions: 1)  tests are being ordered for you today.  a few days after the test(s), please call [phone] to hear your test results. 2)  same medications pending the results 3)  Please schedule a follow-up appointment in 6 weeks. 4)  i gave samples of benicar 20 mg to take 1 once daily, in lieu  of cozaaar, and nexium to take 40 mg once daily, in lieu of protonix. 5)  (update: i left message on phone-tree:  rx as we discussed)   Medication Administration  Injection # 1:    Medication: Vit B12 1000 mcg    Diagnosis: ANEMIA, IRON DEFICIENCY (ICD-280.9)    Route: IM    Site: R deltoid    Exp Date: 9/12    Lot #: [id number]    Mfr: American Regent    Patient tolerated injection without complications    Given by: Josph Macho RMA (July 17, 2009 11:40 AM)  Orders Added: 1)  Vit B12 1000 mcg [J3420] 2)  Admin of Therapeutic Inj  intramuscular or subcutaneous [96372] 3)  TLB-TSH (Thyroid Stimulating  Hormone) [84443-TSH] 4)  Est. Patient Level III [32202]

## 2010-07-03 NOTE — Letter (Signed)
Summary: [facility]  [facility]   Imported By: Lester [place] 05/17/2010 10:40:20  _____________________________________________________________________  External Attachment:    Type:   Image     Comment:   External Document

## 2010-07-03 NOTE — Assessment & Plan Note (Signed)
Summary: yearly medicare physical-lb   Vital Signs:  Patient profile:   65 year old female Height:      65 inches (165.10 cm) Weight:      214.25 pounds (97.39 kg) BMI:     35.78 O2 Sat:      98 % on Room air Temp:     97.7 degrees F (36.50 degrees C) oral Pulse rate:   75 / minute BP sitting:   120 / 84  (left arm) Cuff size:   large  Vitals Entered By: Brenton Grills CMA (AAMA) (May 15, 2010 8:16 AM)  O2 Flow:  Room air CC: Yearly Medicare Wellness/flu shot today/refills on Postassium and Percocet/aj Is Patient Diabetic? Yes Comments Pt has never had Zostavax and does not get yearly paps   Referring Provider:  n/a Primary Provider:  [name], MD  CC:  Yearly Medicare Wellness/flu shot today/refills on Postassium and Percocet/aj.  History of Present Illness: here for regular wellness examination.  she's feeling pretty well in general, except for chronic low-back pain, and does not drink or smoke.  Current Medications (verified): 1)  Klor-Con 20 Meq  Pack (Potassium Chloride) .... Take 1 Three Times A Day By Mouth Qd 2)  Calcium 600/vitamin D 600-200 Mg-Unit  Tabs (Calcium Carbonate-Vitamin D) .... Take 1 By Mouth Qd 3)  Trazodone Hcl 150 Mg  Tabs (Trazodone Hcl) .... Take 1po Qhs 4)  Promethazine Hcl 25 Mg  Tabs (Promethazine Hcl) .... Take 1 Tablet Every 6-8 Hours As Needed For Nausea 5)  Lovastatin 20 Mg  Tabs (Lovastatin) .... Take 1 By Mouth Qd 6)  Percocet 10-325 Mg Tabs (Oxycodone-Acetaminophen) .... One Tablet By Mouth Every 6-8 Hours As Needed 7)  Cyanocobalamin 1000 Mcg/ml Inj Soln (Cyanocobalamin) .... Inject One Ml Once A Month 8)  Slow-Mag 71.5-119 Mg Tbec (Magnesium Cl-Calcium Carbonate) .... 2 Tabs Daily 9)  Vitamin B-1 100 Mg Tabs (Thiamine Hcl) .... Daily  Allergies (verified): 1)  ! Morphine 2)  ! * Ivp Dye 3)  ! Aspirin  Past History:  Past Surgical History: Lysis Adhension/MULTIPLE Cholecystectomy Vein Stripping Right leg Thyroid  Ultrasound (12/1994 & 12/1995) EDG (07/15/2005) Rest Cardiolite (08/07/2003) Multiple SB resections Abdominal Hysterectomy w/BSO Lithotripsy bilat bunionectomies  Family History: Reviewed history from 06/29/2008 and no changes required. Family History of Esophageal Cancer:son (deceased) Family History of Diabetes: mother and father Family History of Heart Disease: mother and father Family History of Colon Cancer:Paternal Uncle Family History of Kidney Disease:Brothers, sister  Social History: Reviewed history from 03/20/2010 and no changes required. Patient is a former smoker. -stopped 40 years ago Alcohol Use - no Daily Caffeine Use-1 cup daily Illicit Drug Use - no Patient gets regular exercise.  Review of Systems  The patient denies fever, vision loss, decreased hearing, chest pain, syncope, prolonged cough, headaches, melena, hematochezia, suspicious skin lesions, and depression.         she has slight doe.  she has chronic abdominal pain and gerd--sees dr Jarold Motto  Physical Exam  General:  obese.  no distress  Head:  head: no deformity eyes: no periorbital swelling, no proptosis external nose and ears are normal mouth: no lesion seen Breasts:  No tenderness, masses, nipple discharge, or skin abnormalities.  Lungs:  Clear to auscultation bilaterally. Normal respiratory effort.  Heart:  Regular rate and rhythm without murmurs or gallops noted. Normal S1,S2.   Abdomen:  abdomen is soft, nontender.  no hepatosplenomegaly.   not distended.  no hernia there are several  healed surgical scars. Rectal:  (sees gi) Genitalia:  (has had tah and bso) Msk:  muscle bulk and strength are grossly normal.  no obvious joint swelling.  gait is normal and steady  Pulses:  dorsalis pedis intact bilat.  no carotid bruit  Extremities:  no deformity.  no ulcer on the feet.  feet are of normal color and temp.  no edema there are healed surgical scars on both feet Neurologic:  cn 2-12  grossly intact.   readily moves all 4's.   sensation is intact to touch on the feet  Skin:  normal texture and temp.  no rash.  not diaphoretic  Cervical Nodes:  No significant adenopathy.  Additional Exam:  SEPARATE EVALUATION FOLLOWS--EACH PROBLEM HERE IS NEW, NOT RESPONDING TO TREATMENT, OR POSES SIGNIFICANT RISK TO THE PATIENT'S HEALTH: HISTORY OF THE PRESENT ILLNESS: pt had i-131 rx for hyperthyroidism in 2010.  pt states she feels well in general, except for low-back pain PAST MEDICAL HISTORY reviewed and up to date today REVIEW OF SYSTEMS: no weight change PHYSICAL EXAMINATION: i do not appreciate a goiter see vs page psych: does not appear anxious nor depressed LAB/XRAY RESULTS: elev tsh is noted IMPRESSION: hypothyroidism, due to i-131 rx PLAN: see instruction page   Impression & Recommendations:  Problem # 1:  ROUTINE GENERAL MEDICAL EXAM@HEALTH  CARE FACL (ICD-V70.0)  Medications Added to Medication List This Visit: 1)  Klor-con 20 Meq Pack (Potassium chloride) .... Take 1 three times a day 2)  Levothyroxine Sodium 75 Mcg Tabs (Levothyroxine sodium) .Marland Kitchen.. 1 tab once daily  Other Orders: T-Parathyroid Hormone, Intact w/ Calcium (16109-60454) Flu Vaccine 23yrs + MEDICARE PATIENTS (U9811) Administration Flu vaccine - MCR (G0008) Pneumococcal Vaccine (91478) Admin 1st Vaccine (29562) Admin of Therapeutic Inj  intramuscular or subcutaneous (13086) Vit B12 1000 mcg (J3420) TLB-IBC Pnl (Iron/FE;Transferrin) (83550-IBC) TLB-Lipid Panel (80061-LIPID) TLB-BMP (Basic Metabolic Panel-BMET) (80048-METABOL) TLB-CBC Platelet - w/Differential (85025-CBCD) TLB-Hepatic/Liver Function Pnl (80076-HEPATIC) TLB-TSH (Thyroid Stimulating Hormone) (84443-TSH) TLB-A1C / Hgb A1C (Glycohemoglobin) (83036-A1C) TLB-Udip w/ Micro (81001-URINE) TLB-Microalbumin/Creat Ratio, Urine (82043-MALB) TLB-Uric Acid, Blood (84550-URIC) Est. Patient Level III (57846) Est. Patient 65& >  (96295)  Patient Instructions: 1)  please consider these measures for your health:  minimize alcohol.  do not use tobacco products.  have a colonoscopy at least every 10 years from age 67.  keep firearms safely stored.  always use seat belts.  have working smoke alarms in your home.  see an eye doctor and dentist regularly.  never drive under the influence of alcohol or drugs (including prescription drugs).   2)  please let me know what your wishes would be, if artificial life support measures should become necessary.  it is critically important to prevent falling down (keep floor areas well-lit, dry, and free of loose objects) 3)  Please schedule a follow-up appointment in 6 months. 4)  blood tests are being ordered for you today.  please call [phone] to hear your test results. 5)  (update: we discussed code status.  pt requests full code, but would not want to be started or maintained on artificial life-support measures if there was not a reasonable chance of recovery) 6)  (update: i left message on phone-tree:  start synthroid 75 micrograms/day.  we'll follow the anemia.  ret 3 mos). Prescriptions: LEVOTHYROXINE SODIUM 75 MCG TABS (LEVOTHYROXINE SODIUM) 1 tab once daily  #30 x 11   Entered and Authorized by:   Minus Breeding MD   Signed by:   Cleophas Dunker  Everardo All MD on 05/15/2010   Method used:   [facility]ically to        [facility]  [address] [id number]       [address]       [place], [place]  [address]       Ph: [phone] or [phone]       Fax: [phone]   RxID:   [id number] KLOR-CON 20 MEQ  PACK (POTASSIUM CHLORIDE) take 1 three times a day  #90 x 11   Entered and Authorized by:   Minus Breeding MD   Signed by:   Minus Breeding MD on 05/15/2010   Method used:   [facility]        [facility]  [address] [id number]       [address]       [place], [place]  [address]       Ph: [phone] or [phone]       Fax: [phone]    RxID:   [id number] PERCOCET 10-325 MG TABS (OXYCODONE-ACETAMINOPHEN) one tablet by mouth every 6-8 hours as needed  #100 x 0   Entered and Authorized by:   Minus Breeding MD   Signed by:   Minus Breeding MD on 05/15/2010   Method used:   Print then Give to Patient   RxID:   [id number]    Medication Administration  Injection # 1:    Medication: Vit B12 1000 mcg    Diagnosis: VITAMIN B12 DEFICIENCY (ICD-266.2)    Route: IM    Site: R deltoid    Exp Date: 12/2011    Lot #: [id number]    Mfr: American Regent    Patient tolerated injection without complications    Given by: Brenton Grills CMA Duncan Dull) (May 15, 2010 9:13 AM)  Orders Added: 1)  T-Parathyroid Hormone, Intact w/ Calcium [61607-37106] 2)  Flu Vaccine 7yrs + MEDICARE PATIENTS [Q2039] 3)  Administration Flu vaccine - MCR [G0008] 4)  Pneumococcal Vaccine [90732] 5)  Admin 1st Vaccine [90471] 6)  Admin of Therapeutic Inj  intramuscular or subcutaneous [96372] 7)  Vit B12 1000 mcg [J3420] 8)  TLB-IBC Pnl (Iron/FE;Transferrin) [83550-IBC] 9)  TLB-Lipid Panel [80061-LIPID] 10)  TLB-BMP (Basic Metabolic Panel-BMET) [80048-METABOL] 11)  TLB-CBC Platelet - w/Differential [85025-CBCD] 12)  TLB-Hepatic/Liver Function Pnl [80076-HEPATIC] 13)  TLB-TSH (Thyroid Stimulating Hormone) [84443-TSH] 14)  TLB-A1C / Hgb A1C (Glycohemoglobin) [83036-A1C] 15)  TLB-Udip w/ Micro [81001-URINE] 16)  TLB-Microalbumin/Creat Ratio, Urine [82043-MALB] 17)  TLB-Uric Acid, Blood [84550-URIC] 18)  Est. Patient Level III [26948] 19)  Est. Patient 65& > [54627]   Immunizations Administered:  Influenza Vaccine # 1:    Vaccine Type: Fluvax 3+    Site: left deltoid    Mfr: Sanofi Pasteur    Dose: 0.5 ml    Route: IM    Given by: Brenton Grills CMA (AAMA)    Exp. Date: 11/29/2010    Lot #: [id number]    VIS given: 2011  Pneumonia Vaccine:    Vaccine Type: Pneumovax    Site: right deltoid    Mfr: Merck    Dose: 0.5 ml    Route: [place]     Given by: Brenton Grills CMA (AAMA)    Exp. Date: 09/26/2011    Lot #: [id number]    VIS given: 05/06/09 version given May 15, 2010.   Immunizations Administered:  Influenza Vaccine # 1:  Vaccine Type: Fluvax 3+    Site: left deltoid    Mfr: Sanofi Pasteur    Dose: 0.5 ml    Route: IM    Given by: Brenton Grills CMA (AAMA)    Exp. Date: 11/29/2010    Lot #: [id number]    VIS given: 2011  Pneumonia Vaccine:    Vaccine Type: Pneumovax    Site: right deltoid    Mfr: Merck    Dose: 0.5 ml    Route: [place]    Given by: Brenton Grills CMA (AAMA)    Exp. Date: 09/26/2011    Lot #: [id number]    VIS given: 05/06/09 version given May 15, 2010.  Flu Vaccine Consent Questions:    Do you have a history of severe allergic reactions to this vaccine? no    Any prior history of allergic reactions to egg and/or gelatin? no    Do you have a sensitivity to the preservative Thimersol? no    Do you have a past history of Guillan-Barre Syndrome? no    Do you currently have an acute febrile illness? no    Have you ever had a severe reaction to latex? no    Vaccine information given and explained to patient? yes    Are you currently pregnant? no   Preventive Care Screening  Mammogram:    Date:  07/18/2009    Results:  normal   Preventive Care Screening  Mammogram:    Date:  07/18/2009    Results:  normal

## 2010-07-03 NOTE — Letter (Signed)
Summary: [facility]  [facility]   Imported By: Lester [place] 05/17/2010 10:41:19  _____________________________________________________________________  External Attachment:    Type:   Image     Comment:   External Document

## 2010-07-03 NOTE — Progress Notes (Signed)
Summary: Hair loss  Phone Note Call from Patient Call back at Home Phone [phone]   Caller: Patient Summary of Call: Pt called stating she has been experiencing hair loss for the last few days and is concerned that it is related to her Thyroid levels or thyorid meds, please advise. Initial call taken by: Margaret Pyle, CMA,  June 04, 2010 2:59 PM  Follow-up for Phone Call        yes, it is possible.  increase synthroid to 125 micrograms/day.  ret as scheduled Follow-up by: Minus Breeding MD,  June 04, 2010 5:33 PM  Additional Follow-up for Phone Call Additional follow up Details #1::        Pt advised of above and Rx Additional Follow-up by: Margaret Pyle, CMA,  June 05, 2010 9:06 AM    New/Updated Medications: LEVOTHYROXINE SODIUM 125 MCG TABS (LEVOTHYROXINE SODIUM) 1 tab once daily Prescriptions: LEVOTHYROXINE SODIUM 125 MCG TABS (LEVOTHYROXINE SODIUM) 1 tab once daily  #30 x 2   Entered and Authorized by:   Minus Breeding MD   Signed by:   Minus Breeding MD on 06/04/2010   Method used:   [facility]  [address] [id number]       [address]       [place], [place]  [address]       Ph: [phone] or [phone]       Fax: [phone]   RxID:   [id number]

## 2010-07-03 NOTE — Assessment & Plan Note (Signed)
Summary: PER PT 1 MTH B12  SAE  STC   Nurse Visit   Allergies: 1)  ! Morphine 2)  ! * Ivp Dye 3)  ! Aspirin  Medication Administration  Injection # 1:    Medication: Vit B12 1000 mcg    Diagnosis: VITAMIN B12 DEFICIENCY (ICD-266.2)    Route: IM    Site: L deltoid    Exp Date: 12/31/2011    Lot #: [id number]    Mfr: American Regent  Orders Added: 1)  Admin of Therapeutic Inj  intramuscular or subcutaneous [96372] 2)  Vit B12 1000 mcg [J3420]

## 2010-07-08 ENCOUNTER — Telehealth: Payer: Self-pay | Admitting: Endocrinology

## 2010-07-08 ENCOUNTER — Other Ambulatory Visit: Payer: Self-pay | Admitting: Endocrinology

## 2010-07-08 ENCOUNTER — Encounter (INDEPENDENT_AMBULATORY_CARE_PROVIDER_SITE_OTHER): Payer: Self-pay | Admitting: *Deleted

## 2010-07-08 ENCOUNTER — Other Ambulatory Visit: Payer: MEDICARE

## 2010-07-08 DIAGNOSIS — Z1231 Encounter for screening mammogram for malignant neoplasm of breast: Secondary | ICD-10-CM

## 2010-07-08 DIAGNOSIS — E890 Postprocedural hypothyroidism: Secondary | ICD-10-CM

## 2010-07-08 LAB — TSH: TSH: 1.14 u[IU]/mL (ref 0.35–5.50)

## 2010-07-17 ENCOUNTER — Ambulatory Visit: Payer: Medicare Other

## 2010-07-17 NOTE — Progress Notes (Signed)
Summary: Percocet  Phone Note Call from Patient Call back at Home Phone [phone]   Caller: Patient Summary of Call: Pt is requesting refill of Percocet Initial call taken by: Brenton Grills CMA Duncan Dull),  July 08, 2010 1:21 PM  Follow-up for Phone Call        i printed Follow-up by: Minus Breeding MD,  July 08, 2010 1:40 PM  Additional Follow-up for Phone Call Additional follow up Details #1::        Called pt no ansew LMOM  rx ready for pick/up. Put in cabinet up front Additional Follow-up by: Orlan Leavens RMA,  July 08, 2010 1:48 PM    Prescriptions: PERCOCET 10-325 MG TABS (OXYCODONE-ACETAMINOPHEN) one tablet by mouth every 6-8 hours as needed  #100 x 0   Entered and Authorized by:   Minus Breeding MD   Signed by:   Minus Breeding MD on 07/08/2010   Method used:   Print then Give to Patient   RxID:   [id number]

## 2010-07-17 NOTE — Progress Notes (Signed)
  Phone Note Call from Patient Call back at [facility] Phone [phone]   Caller: Patient Summary of Call: Pt called stating she is experiencing, diarrhea, heat intolerence and extreme fatigue on Levothyroxine .  Initial call taken by: Margaret Pyle, CMA,  July 08, 2010 11:00 AM  Follow-up for Phone Call        come in for tsh 244.1 Follow-up by: Minus Breeding MD,  July 08, 2010 11:02 AM  Additional Follow-up for Phone Call Additional follow up Details #1::        pt states she will come in today for TSH, order placed into Epic Additional Follow-up by: Brenton Grills CMA (AAMA),  July 08, 2010 1:20 PM

## 2010-07-28 ENCOUNTER — Ambulatory Visit: Payer: Self-pay

## 2010-07-29 ENCOUNTER — Ambulatory Visit
Admission: RE | Admit: 2010-07-29 | Discharge: 2010-07-29 | Disposition: A | Discharge status: Home / Self Care | Payer: Medicare Other | Source: Ambulatory Visit | Attending: Endocrinology | Admitting: Endocrinology

## 2010-07-29 DIAGNOSIS — Z1231 Encounter for screening mammogram for malignant neoplasm of breast: Secondary | ICD-10-CM

## 2010-07-29 LAB — HM MAMMOGRAPHY: HM Mammogram: NORMAL

## 2010-08-14 ENCOUNTER — Encounter: Payer: Self-pay | Admitting: Endocrinology

## 2010-08-14 ENCOUNTER — Other Ambulatory Visit: Payer: Self-pay | Admitting: Endocrinology

## 2010-08-14 ENCOUNTER — Ambulatory Visit (INDEPENDENT_AMBULATORY_CARE_PROVIDER_SITE_OTHER): Payer: Medicare Other | Admitting: Endocrinology

## 2010-08-14 ENCOUNTER — Other Ambulatory Visit: Payer: Medicare Other

## 2010-08-14 DIAGNOSIS — E890 Postprocedural hypothyroidism: Secondary | ICD-10-CM

## 2010-08-14 DIAGNOSIS — E538 Deficiency of other specified B group vitamins: Secondary | ICD-10-CM

## 2010-08-14 DIAGNOSIS — R1310 Dysphagia, unspecified: Secondary | ICD-10-CM

## 2010-08-14 DIAGNOSIS — E042 Nontoxic multinodular goiter: Secondary | ICD-10-CM

## 2010-08-14 LAB — GLUCOSE, CAPILLARY
Glucose-Capillary: 108 mg/dL — ABNORMAL HIGH (ref 70–99)
Glucose-Capillary: 119 mg/dL — ABNORMAL HIGH (ref 70–99)
Glucose-Capillary: 125 mg/dL — ABNORMAL HIGH (ref 70–99)
Glucose-Capillary: 129 mg/dL — ABNORMAL HIGH (ref 70–99)
Glucose-Capillary: 90 mg/dL (ref 70–99)
Glucose-Capillary: 93 mg/dL (ref 70–99)
Glucose-Capillary: 95 mg/dL (ref 70–99)
Glucose-Capillary: 96 mg/dL (ref 70–99)

## 2010-08-14 LAB — CBC
HCT: 32.3 % — ABNORMAL LOW (ref 36.0–46.0)
MCH: 29.4 pg (ref 26.0–34.0)
MCHC: 34.1 g/dL (ref 30.0–36.0)
MCV: 86.4 fL (ref 78.0–100.0)
RDW: 13.5 % (ref 11.5–15.5)

## 2010-08-14 LAB — URINE CULTURE
Culture  Setup Time: 201109041829
Culture  Setup Time: 208709180028

## 2010-08-14 LAB — RENAL FUNCTION PANEL
Albumin: 3.5 g/dL (ref 3.5–5.2)
BUN: 6 mg/dL (ref 6–23)
Chloride: 112 mEq/L (ref 96–112)
GFR calc Af Amer: >60 mL/min (ref >60)
GFR calc non Af Amer: 58 mL/min — ABNORMAL LOW (ref >60)
Phosphorus: 2.8 mg/dL (ref 2.3–4.6)
Potassium: 3.7 mEq/L (ref 3.5–5.1)
Sodium: 142 mEq/L (ref 135–145)

## 2010-08-14 LAB — URINALYSIS, ROUTINE W REFLEX MICROSCOPIC
Bilirubin Urine: NEGATIVE
Bilirubin Urine: NEGATIVE
Hgb urine dipstick: NEGATIVE
Ketones, ur: NEGATIVE mg/dL
Ketones, ur: NEGATIVE mg/dL
Nitrite: NEGATIVE
Nitrite: NEGATIVE
Protein, ur: NEGATIVE mg/dL
Urobilinogen, UA: 0.2 mg/dL (ref 0.0–1.0)
Urobilinogen, UA: 0.2 mg/dL (ref 0.0–1.0)
pH: 6 (ref 5.0–8.0)

## 2010-08-14 LAB — FECAL LACTOFERRIN, QUANT: Fecal Lactoferrin: NEGATIVE

## 2010-08-14 LAB — CLOSTRIDIUM DIFFICILE EIA: C difficile Toxins A+B, EIA: NEGATIVE

## 2010-08-14 LAB — COMPREHENSIVE METABOLIC PANEL
BUN: 5 mg/dL — ABNORMAL LOW (ref 6–23)
Calcium: 9.3 mg/dL (ref 8.4–10.5)
Creatinine, Ser: 0.85 mg/dL (ref 0.4–1.2)
Glucose, Bld: 79 mg/dL (ref 70–99)
Total Protein: 7.5 g/dL (ref 6.0–8.3)

## 2010-08-14 LAB — DIFFERENTIAL
Lymphs Abs: 1.3 10*3/uL (ref 0.7–4.0)
Monocytes Relative: 7 % (ref 3–12)
Neutro Abs: 3.8 10*3/uL (ref 1.7–7.7)
Neutrophils Relative %: 69 % (ref 43–77)

## 2010-08-14 LAB — TSH: TSH: 1.4 u[IU]/mL (ref 0.35–5.50)

## 2010-08-14 LAB — HEMOGLOBIN A1C
Hgb A1c MFr Bld: 5.6 % (ref <5.7)
Mean Plasma Glucose: 114 mg/dL (ref <117)

## 2010-08-14 LAB — URINE MICROSCOPIC-ADD ON

## 2010-08-18 ENCOUNTER — Ambulatory Visit: Payer: Medicare Other

## 2010-08-18 ENCOUNTER — Ambulatory Visit
Admission: RE | Admit: 2010-08-18 | Discharge: 2010-08-18 | Disposition: A | Discharge status: Home / Self Care | Payer: Medicare Other | Source: Ambulatory Visit | Attending: Endocrinology | Admitting: Endocrinology

## 2010-08-18 DIAGNOSIS — E042 Nontoxic multinodular goiter: Secondary | ICD-10-CM

## 2010-08-28 NOTE — Assessment & Plan Note (Signed)
Summary: 6 month follow up/ stc   Vital Signs:  Patient profile:   65 year old female Menstrual status:  hysterectomy Height:      65 inches (165.10 cm) Weight:      214.25 pounds (97.39 kg) BMI:     35.78 O2 Sat:      98 % on Room air Temp:     98.0 degrees F (36.67 degrees C) oral Pulse rate:   87 / minute Pulse rhythm:   regular BP sitting:   108 / 82  (left arm) Cuff size:   large  Vitals Entered By: Brenton Grills CMA Duncan Dull) (August 14, 2010 9:15 AM)  O2 Flow:  Room air CC: 6 month F/U/aj Is Patient Diabetic? Yes     Menstrual Status hysterectomy   Referring Provider:  n/a Primary Provider:  [name], MD  CC:  6 month F/U/aj.  History of Present Illness: pt states 1 day of slight pain at the anterior neck, and assoc sense of fullness there.    Current Medications (verified): 1)  Klor-Con 20 Meq  Pack (Potassium Chloride) .... Take 1 Three Times A Day 2)  Calcium 600/vitamin D 600-200 Mg-Unit  Tabs (Calcium Carbonate-Vitamin D) .... Take 1 By Mouth Qd 3)  Trazodone Hcl 150 Mg  Tabs (Trazodone Hcl) .... Take 1po Qhs 4)  Promethazine Hcl 25 Mg  Tabs (Promethazine Hcl) .... Take 1 Tablet Every 6-8 Hours As Needed For Nausea 5)  Lovastatin 20 Mg  Tabs (Lovastatin) .... Take 1 By Mouth Qd 6)  Percocet 10-325 Mg Tabs (Oxycodone-Acetaminophen) .... One Tablet By Mouth Every 6-8 Hours As Needed 7)  Cyanocobalamin 1000 Mcg/ml Inj Soln (Cyanocobalamin) .... Inject One Ml Once A Month 8)  Slow-Mag 71.5-119 Mg Tbec (Magnesium Cl-Calcium Carbonate) .... 2 Tabs Daily 9)  Vitamin B-1 100 Mg Tabs (Thiamine Hcl) .... Daily 10)  Levothyroxine Sodium 125 Mcg Tabs (Levothyroxine Sodium) .Marland Kitchen.. 1 Tab Once Daily  Allergies (verified): 1)  ! Morphine 2)  ! * Ivp Dye 3)  ! 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 DIFF COLITIS 9/11  Review of Systems       she has intermittent dysphagia, at the anterior chest.   she has lost a few lbs, due to her efforts  Physical Exam  General:  obese.  no distress  Neck:  small multinodular goiter, right > left Psych:  Alert and cooperative; normal mood and affect; normal attention span and concentration.   Additional Exam:  FastTSH                   1.40 uIU/mL    Impression & Recommendations:  Problem # 1:  DYSPHAGIA UNSPECIFIED (ICD-787.20) ? recurrent esophageal stricture  Problem # 2:  GOITER, MULTINODULAR (ICD-241.1) ? any change  Problem # 3:  HYPOTHYROIDISM, POST-RADIATION (ICD-244.1) well-replaced  Other Orders: Admin of Therapeutic Inj  intramuscular or subcutaneous (09811) Vit B12 1000 mcg (B1478) Gastroenterology Referral (GI) Radiology Referral (Radiology) TLB-TSH (Thyroid Stimulating Hormone) (84443-TSH) Est. Patient Level IV (29562)  Patient Instructions: 1)  let's recheck a thyroid ultrasound and thyroid blood test.  you will be called with a day and time for  an appointment.  then please call [phone] to hear your test results. 2)  refer back to dr Jarold Motto.  you will be called with a day and time for an appointment. 3)  (update: i left message on phone-tree:  rx as we discussed) Prescriptions: PERCOCET 10-325 MG TABS (OXYCODONE-ACETAMINOPHEN) one tablet by mouth every 6-8 hours as needed  #100 x 0   Entered and Authorized by:   Minus Breeding MD   Signed by:   Minus Breeding MD on 08/14/2010   Method used:   Print then Give to Patient   RxID:   [id number]    Medication  Administration  Injection # 1:    Medication: Vit B12 1000 mcg    Diagnosis: VITAMIN B12 DEFICIENCY (ICD-266.2)    Route: IM    Site: L deltoid    Exp Date: 04/2012    Lot #: [id number]    Mfr: American Regent    Patient tolerated injection without complications    Given by: Brenton Grills CMA Duncan Dull) (August 14, 2010 9:26 AM)  Orders Added: 1)  Admin of Therapeutic Inj  intramuscular or subcutaneous [96372] 2)  Vit B12 1000 mcg [J3420] 3)  Gastroenterology Referral [GI] 4)  Radiology Referral [Radiology] 5)  TLB-TSH (Thyroid Stimulating Hormone) [84443-TSH] 6)  Est. Patient Level IV [62130]    Preventive Care Screening  Mammogram:    Date:  07/29/2010    Results:  normal     Preventive Care Screening  Mammogram:    Date:  07/29/2010    Results:  normal

## 2010-09-09 ENCOUNTER — Ambulatory Visit: Payer: Medicare Other | Admitting: Gastroenterology

## 2010-09-15 ENCOUNTER — Ambulatory Visit (INDEPENDENT_AMBULATORY_CARE_PROVIDER_SITE_OTHER): Payer: Medicare Other | Admitting: *Deleted

## 2010-09-15 DIAGNOSIS — E538 Deficiency of other specified B group vitamins: Secondary | ICD-10-CM

## 2010-09-15 LAB — GLUCOSE, CAPILLARY
Glucose-Capillary: 40 mg/dL — ABNORMAL LOW (ref 70–99)
Glucose-Capillary: 52 mg/dL — ABNORMAL LOW (ref 70–99)
Glucose-Capillary: 61 mg/dL — ABNORMAL LOW (ref 70–99)
Glucose-Capillary: 92 mg/dL (ref 70–99)

## 2010-09-15 MED ORDER — CYANOCOBALAMIN 1000 MCG/ML IJ SOLN
1000.0000 ug | Freq: Once | INTRAMUSCULAR | Status: AC
  Administered 2010-09-15: 1000 ug via INTRAMUSCULAR

## 2010-09-16 ENCOUNTER — Ambulatory Visit (INDEPENDENT_AMBULATORY_CARE_PROVIDER_SITE_OTHER): Payer: Medicare Other | Admitting: Gastroenterology

## 2010-09-16 ENCOUNTER — Encounter: Payer: Self-pay | Admitting: Gastroenterology

## 2010-09-16 VITALS — BP 126/80 | HR 76 | Ht 65.0 in | Wt 213.0 lb

## 2010-09-16 DIAGNOSIS — R1084 Generalized abdominal pain: Secondary | ICD-10-CM

## 2010-09-16 DIAGNOSIS — K912 Postsurgical malabsorption, not elsewhere classified: Secondary | ICD-10-CM

## 2010-09-16 DIAGNOSIS — R197 Diarrhea, unspecified: Secondary | ICD-10-CM

## 2010-09-16 NOTE — Patient Instructions (Signed)
Please go to the basement today for your labs.   

## 2010-09-16 NOTE — Progress Notes (Signed)
History of Present Illness: This is a 65 year old African American female with short bowel syndrome who was admitted last November for C. difficile enterocolitis. She was treated with vancomycin and has done well until the last 2 weeks but she has had recurrence of her diarrhea and general malaise without fever or chills. She denies rectal bleeding, upper gastrointestinal or hepatobiliary complaints. Current Medications, Allergies, Past Medical History, Past Surgical History, Family History and Social History were reviewed in [employer] record.   Assessment and plan: Probable recurrence of C. difficile colitis. Ordered repeat stool exam for C. difficile toxin by PCR and we'll proceed accordingly. The patient has been intolerant of metronidazole. Encounter Diagnosis  Name Primary?  . Diarrhea Yes

## 2010-09-17 ENCOUNTER — Other Ambulatory Visit: Payer: Medicare Other

## 2010-09-17 DIAGNOSIS — R197 Diarrhea, unspecified: Secondary | ICD-10-CM

## 2010-09-18 ENCOUNTER — Telehealth: Payer: Self-pay | Admitting: Gastroenterology

## 2010-09-18 MED ORDER — PROMETHAZINE HCL 25 MG PO TABS: 25.0000 mg | ORAL_TABLET | Freq: Four times a day (QID) | ORAL | Status: DC | PRN

## 2010-09-18 NOTE — Telephone Encounter (Signed)
yes

## 2010-09-18 NOTE — Telephone Encounter (Signed)
Pt saw [place] on 09/16/10 and stool sent for CDIFF- sample not sent until 09/17/10. Pt today c/o nausea and abdominal cramping. Her med list has Phenergan 25mg , but she reports ahe doesn't have any. OK to order the Phenergan and can she have something for the cramping? Thanks.

## 2010-09-18 NOTE — Telephone Encounter (Signed)
Notified pt Dr Jarold Motto ordered her Phenergan; call for further problems. Pt stated understanding.

## 2010-09-19 ENCOUNTER — Other Ambulatory Visit: Payer: Self-pay | Admitting: Endocrinology

## 2010-09-22 ENCOUNTER — Telehealth: Payer: Self-pay | Admitting: Gastroenterology

## 2010-09-22 NOTE — Telephone Encounter (Signed)
Last OV 09/16/10 with c/o diarrhea, cramping. Hx Short bowel Syndrome, CDIFF. Results back today- CDIFF  Not detected. Pt still c/o cramping on and off in her stomach and L side right near her belly button. She also c/o diarrhea- loose stools w/o blood. She's already had 4 stools today. Pt is on phenergan from our office and oxycodone. The oxycodone helps the pain some of the time. Pt is on a soft diet, stays away from lactose. Any advice? Thanks.

## 2010-09-22 NOTE — Telephone Encounter (Signed)
Patient aware she needs colonoscopy with MAC ASAP, but she would like to call back when she is ready to schedule.

## 2010-09-22 NOTE — Telephone Encounter (Signed)
COLON WITH PROPOFOL ASAP

## 2010-09-24 ENCOUNTER — Ambulatory Visit (AMBULATORY_SURGERY_CENTER): Payer: Medicare Other | Admitting: *Deleted

## 2010-09-24 ENCOUNTER — Encounter: Payer: Self-pay | Admitting: Gastroenterology

## 2010-09-24 VITALS — Ht 65.0 in | Wt 212.4 lb

## 2010-09-24 DIAGNOSIS — R197 Diarrhea, unspecified: Secondary | ICD-10-CM

## 2010-09-24 MED ORDER — PEG-KCL-NACL-NASULF-NA ASC-C 100 G PO SOLR: ORAL | Status: DC

## 2010-09-25 ENCOUNTER — Encounter: Payer: Self-pay | Admitting: Gastroenterology

## 2010-09-29 ENCOUNTER — Other Ambulatory Visit: Payer: Self-pay

## 2010-09-29 ENCOUNTER — Other Ambulatory Visit: Payer: Self-pay | Admitting: Endocrinology

## 2010-09-29 MED ORDER — OXYCODONE-ACETAMINOPHEN 10-325 MG PO TABS: ORAL_TABLET | ORAL | Status: DC

## 2010-09-29 NOTE — Telephone Encounter (Signed)
Pt called requesting refill of pain meds, last OV 08/14/2010, last written 08/14/2010 #100 x 0

## 2010-09-29 NOTE — Telephone Encounter (Signed)
i printed 

## 2010-09-29 NOTE — Telephone Encounter (Signed)
Pt informed, Rx in cabinet for pt pick up  

## 2010-09-30 ENCOUNTER — Encounter: Payer: Self-pay | Admitting: Gastroenterology

## 2010-09-30 NOTE — Telephone Encounter (Signed)
Please refill prn 

## 2010-10-01 ENCOUNTER — Encounter: Payer: Self-pay | Admitting: Gastroenterology

## 2010-10-01 ENCOUNTER — Ambulatory Visit (AMBULATORY_SURGERY_CENTER): Payer: Medicare Other | Admitting: Gastroenterology

## 2010-10-01 DIAGNOSIS — K912 Postsurgical malabsorption, not elsewhere classified: Secondary | ICD-10-CM

## 2010-10-01 DIAGNOSIS — R1084 Generalized abdominal pain: Secondary | ICD-10-CM

## 2010-10-01 DIAGNOSIS — K648 Other hemorrhoids: Secondary | ICD-10-CM

## 2010-10-01 DIAGNOSIS — R197 Diarrhea, unspecified: Secondary | ICD-10-CM

## 2010-10-01 DIAGNOSIS — K5730 Diverticulosis of large intestine without perforation or abscess without bleeding: Secondary | ICD-10-CM

## 2010-10-01 DIAGNOSIS — R109 Unspecified abdominal pain: Secondary | ICD-10-CM

## 2010-10-01 DIAGNOSIS — K902 Blind loop syndrome, not elsewhere classified: Secondary | ICD-10-CM

## 2010-10-01 LAB — HM COLONOSCOPY

## 2010-10-01 MED ORDER — SODIUM CHLORIDE 0.9 % IV SOLN: 500.0000 mL | INTRAVENOUS | Status: DC

## 2010-10-01 NOTE — Patient Instructions (Signed)
Findings:  Normal Exam, Internal and external Hemorrhoids  Recommendations:  Continue Current Medications

## 2010-10-02 ENCOUNTER — Telehealth: Payer: Self-pay | Admitting: *Deleted

## 2010-10-02 NOTE — Telephone Encounter (Signed)

## 2010-10-06 ENCOUNTER — Encounter: Payer: Self-pay | Admitting: Gastroenterology

## 2010-10-09 ENCOUNTER — Telehealth: Payer: Self-pay | Admitting: *Deleted

## 2010-10-09 NOTE — Telephone Encounter (Signed)
Per letter sent to pt::10/06/2010  MRN: [id number]  [name] [address] [place] [address]   Dear Ms. Jeraldine Loots,  I am writing to inform you that the biopsies taken during your recent colonoscopy showed no evidence of significant colitis. My nurse will call you to start Questran 4 g in orange juice in mid morning 2 hours apart  From your other medications to see if this will help your diarrhea state .  Spoke with pt who stated she can't take OJ d/t her reflux. Explained to pt she can mix with water or any non-carbonated drink. Pt is still having diarrhea, but not as bad. Does she still continue the Miralax? Thanks.

## 2010-10-10 NOTE — Telephone Encounter (Signed)
No miralax

## 2010-10-10 NOTE — Telephone Encounter (Signed)
LMOM for pt to call back. Dr Jarold Motto stated to stop the Miralax. She may take the Questran in any non carbonated liquid including water. If ok I will order the med at [facility]

## 2010-10-13 ENCOUNTER — Telehealth: Payer: Self-pay | Admitting: *Deleted

## 2010-10-13 MED ORDER — CHOLESTYRAMINE 4 G PO PACK: PACK | ORAL | Status: DC

## 2010-10-13 NOTE — Telephone Encounter (Signed)
Informed pt she may take the Questran in any non carbonated liquid, 2 hours apart from other meds. Pt stated understanding.

## 2010-10-13 NOTE — Telephone Encounter (Signed)
Error - duplicate

## 2010-10-14 NOTE — Discharge Summary (Signed)
NAMEJAYLANI, MCGUINN NO.:  [id number]   MEDICAL RECORD NO.:  [id number]          PATIENT TYPE:  INP   LOCATION:  6713                         FACILITY:  [facility]   PHYSICIAN:  Rachael Fee, MD   DATE OF BIRTH:  Mar 05, 1946   DATE OF ADMISSION:  05/17/2008  DATE OF DISCHARGE:  05/22/2008                               DISCHARGE SUMMARY   ADMITTING DIAGNOSES:  1. Acute right flank and pelvic pain associated with nausea, vomiting.      Urology workup including CT scan at the office today is negative      for urologic etiology.  CT reveals inflammation in the right      abdomen adjacent to the colon.  Rule out diverticulitis, rule out      colitis.  2. History of multiple small bowel obstructions secondary to      adhesions.  She has undergone several exploratory laparotomies with      lysis of adhesions.  3. 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  Chronic diarrhea is normal.  4. Status post multiple abdominal pelvic surgeries including      hysterectomy, cholecystectomy, and the lysis of adhesions.  5. History of esophageal stricture, dilated on Sep 30, 2007.  6. History of severe pancolonic dense diverticulosis, on colonoscopy,      July 13, 2007.  7. Diet-controlled diabetes.  8. Hypertension.  9. History of hyperthyroidism.  10.History of multinodular goiter in 1998.  11.History of nephrolithiasis.  12.Multiple medical allergies including CODEINE, MORPHINE, PENICILLIN,      AUGMENTIN, SULFA, and FLAGYL.  She does state a reaction to IV DYE,      which consisted of nausea and vomiting, but no anaphylactic-type      reaction to IV dye.  13.History of gout.  14.Lower extremity varicosities.  15.Status post right leg vein stripping.  16.Dyslipidemia.  17.Pernicious anemia.  18.Leukopenia.   DISCHARGE DIAGNOSES:  1. Abdominal pain with nausea.  Probably secondary to diverticulitis      less  likely secondary to colitis.  Unlikely that the CT findings      and symptoms represent neoplasia given very recent screening      colonoscopy showing no neoplastic processes.  2. Urinary retention secondary to narcotics, resolved.  3. Chest wall and left shoulder pain with x-ray showing degenerative      changes in the shoulder.  4. Short bowel syndrome, stable frequent loose stools.  5. History of pernicious anemia, hemoglobin and hematocrit moderately      low, but stable during admission.  6. Hypokalemia, resolved.  7. Depression.   BRIEF HISTORY:  [name] is a 65 year old African American woman who  presented to Dr. Maryfrances Bunnell office with a 3-day history of pain  in the right abdomen, flank, and radiating into the low back and pelvis.  This did not affect her usual loose, frequent stooling pattern.  She did  not see any blood in her stools.  She did have some nausea and vomiting,  but this was not frequent.  She  felt unwell, but no fevers, chills, and  no urologic issues such as dysuria or blood in the urine.  Dr. Jarold Motto  was concerned that this may be a kidney stone and sent her over to  urology office for evaluation.  However, the CT scan there which was  performed without contrast of any sort showed circumferential thickening  and pericolonic stranding in a short segment of colon in the region of  the hepatic flexure with differential including neoplasia versus  segmental colitis.  There was also some mild bilateral pelviectasis and  mild hydroureter, which may be secondary to distended bladder, but no  evidence for ureteral calculi.  It is known that the patient has dense  diverticulosis from colonoscopy performed in February 2009, and this was  felt to be the etiology of her complaints.  She was admitted to the  hospital for antibiotics and supportive care.   LABORATORIES:  Hemoglobin on admission 9.9, it was 10.1 on recheck.  Hematocrit 29.8, it was 30.5 on  recheck.  White blood cell count 3.9,  platelets 258,000.  The sedimentation rate was 31.  Capillary blood  glucoses ranging 76-135.  Sodium 141, potassium 3.0, chloride 106, CO2  of 25.  Glucose 106.  BUN 5, creatinine 0.6.  On recheck, potassium had  risen to 3.5 following supplementation.  Total bilirubin 0.5, alkaline  phosphatase 62, AST 24, ALT 15, albumin 3.0, and calcium 8.4.  C-  reactive protein 7.0.  Urine culture revealed no growth.   IMAGING STUDIES:  Initial CT scan performed at [facility] of  May 17, 2008, showed circumferential thickening and pericolonic  stranding and a short segment of colon at the hepatic flexure.  Bilateral pelviectasis and mild hydroureter also noted and felt  secondary to distended bladder.  No evidence for ureteral calculi.  Pelvic views were unremarkable.  CT scan of the pelvis with contrast of  May 21, 2008, showed circumferential thickening, inflammation in  the proximal transverse colon may be related to colitis.  Followup will  be necessary to exclude underlying malignancy.  No abscess is seen.  There was some mild fullness of the renal collecting system without  point of obstruction identified.  Atherosclerotic changes of the aorta  with ectasia, but no focal aneurysm.  Within the pelvis, there were some  postsurgical changes without findings of acute inflammation or drainable  abscess within the pelvis.  Finally, the x-ray of the left shoulder  showed mild acromial clavicular joint degenerative changes.   HOSPITAL COURSE:  Ms. Samad was admitted to non-telemetry bed.  She  was started initially on ciprofloxacin alone because she has multiple  antibiotic allergies.  Within a few days, oral clindamycin was begun.  The patient continued to complain of pain, although it was at a less  intense rate and she would experience coming into the hospital.  She had  initially made good use of available Dilaudid.  Unfortunately, this   caused her some urinary retention, which required temporary placement of  a Foley catheter.  Diet was advanced from clear liquids through full  liquids and onto a low-residue diet.  She tolerated all of this well.  The nausea and vomiting did not recur during this hospitalization.  Pain  did not completely resolve, but was significantly improved.   The patient had no fevers.  She had normal white blood cell count.  A  repeat CT scan of the abdomen showed stable appearance in the colonic  inflammation, which Dr. Arlyce Dice  and Dr. Christella Hartigan felt was most likely  secondary to diverticulitis.  However, one consideration is that the  patient is a smoker and that she does have evidence for aortic vascular  disease, so it is possible that this may represent some ischemia, but  this is not felt as to be as likely the source for her complaints.   The patient developed some pain in her left chest wall radiating into  the scapula and shoulder.  This was quite tender to the touch and made  it painful for her to twist and move about.  It was treated with  Vicodin.  She did get left shoulder x-rays, which revealed degenerative  changes of the left shoulder.  An EKG was performed, although this pain  was not typically cardiac in history or character.  The EKG showed some  normal sinus rhythm and some voltage criteria for left ventricular  hypertrophy, but no ischemic type changes.   Notable about the patient during this hospitalization is a consistently  flat affect, wondering whether or not the patient has some previously  undiagnosed depression and/or anxiety.   The patient has a history of type 2 diabetes.  Blood sugars were  monitored, but she did not require any sliding scale insulin.  She says  that since she has lost weight that she was able to get off the oral  diabetes medications.   The patient did initially have some hypokalemia, but this was corrected  with potassium in her IV fluids.  She is  to resume antihypertensives and  her potassium at discharge.   CONDITION AT DISCHARGE:  Stable and improved.   DIET:  Carbohydrate modified low residue type.   MEDICATIONS AT DISCHARGE:  1. Clindamycin 600 mg 3 times daily for 2 weeks.  2. Ciprofloxacin 500 mg twice daily for 2 weeks.  3. Cozaar 50 mg once daily.  4. Lasix 40 mg once daily.  5. Klor-Con 20 mEq 3 times daily.  6. Lovastatin 20 mg once daily.  7. Promethazine 25 mg as needed.  8. Protonix 40 mg twice daily.  9. Trazodone 150 mg 1 tablet at bedtime.  10.Vitamin B12 shots once monthly.  11.Dilaudid 40 mg p.o. every 6 hours as needed.  12.Ultram 50 mg as needed.  13.Hydrocodone 5/500 one p.o. every 6 hours as needed.  14.Tylenol as needed up to 3 g daily.  Note that the patient was      educated as to the presence of acetaminophen in Tylenol and      hydrocodone and that she should not take more than a total of 6      hydrocodone and/or Extra Strength Tylenol or a combination of both      of these drugs in a 24-hour period in order to avoid acetaminophen      toxicity.   Return office visit is on June 12, 2008, at 11 a.m. with Dr. Sheryn Bison.      Jennye Moccasin, PA-C      Rachael Fee, MD  Electronically Signed    SG/MEDQ  D:  05/22/2008  T:  05/22/2008  Job:  [id number]   cc:   [name]. Jarold Motto, MD, [facility], FAGA

## 2010-10-14 NOTE — Assessment & Plan Note (Signed)
[name]                         GASTROENTEROLOGY OFFICE NOTE   [name], [name]                  MRN:          [id number]  DATE:07/07/2007                            DOB:          1945-10-04    [name] continues with 5 to 6 loose bowel movements a day and a consult  dull discomfort in her left upper quadrant area.  She is having no  bloody diarrhea, anorexia, weight loss, upper GI or systemic complaints.  All of her medications are reviewed today and are similar to her  previous visits.  She has not been on antibiotics in several months.  She is taking Protonix 40 mg twice a day and her cardiac medications but  no narcotics at this time.   It is of note the patient's CT scan in June of 2008 showed multiple  adhesions and some soft tissue thickening of the left upper quadrant  area, felt secondary to adhesions and omental thickening.  She also had  some inflammatory diverticulitis on CT scan at that time and was  hospitalized by primary care from June 9 to November 14, 2006.  She also, at  that time, was found to have a thyroid nodule, thyroid goiter and this  is being followed and treated by Dr. Everardo All.  She initially was on PTU,  50 mg a day, but is off of that at this time.  She underwent thyroid  biopsies during her last hospitalization.  It does not appear that the  patient was treated with antibiotics in June at the time of her  hospitalization.   As mentioned above, she continues with loose watery stools 5 to 6 times  a day with some incontinence and she has to wear Depends.  She has had  no recent rectal bleeding.  Her last colonoscopy exam was 2 to 3 years  ago.   PHYSICAL EXAMINATION:  She is awake, alert and in no acute distress.  She actually appears about the best I have seen her in years in terms of  her general appearance.  She weighs 186 pounds, which is her normal  weight.  Her blood pressure is 106/62.  Pulse was 78 and  regular.  I  could not appreciate stigmata of chronic liver disease.  Her abdominal  exam showed multiple surgical scars and deformities throughout the  abdominal wall.  There was no definite organomegaly, masses or  tenderness.  Bowel sounds were non obstructed.  Rectal exam was  deferred.   ASSESSMENT:  1. Left upper quadrant pain which is probably structural in nature      related to previous multiple surgeries and adhesions.  2. History of intermittent small bowel obstruction treated with GI      decompression and bowel rest on a p.r.n. basis.  3. History of recurrent diverticulitis.  4. History of short bowel syndrome related to multiple previous small      bowel resections for reasons which still remain unclear.  5. History of hypertensive cardiovascular disease.  6. History of chronic obstructive pulmonary disease.  7. History of osteoarthritis followed by orthopedics.  8. Hyperlipidemia.  9. Chronic  acid reflux; management with Protonix therapy.  10.Previous empiric treated for bacterial overgrowth syndrome without      improvement in her symptomatology.  11.Status post total abdominal hysterectomy, bilateral salpingo-      oophorectomy, cholecystectomy and vein stripping.  12.History of thyroid goiter and hypothyroidism treated with PTU      followed by Dr. Everardo All.  13.History of rectal bleeding from mixed hemorrhoids.   RECOMMENDATIONS:  1. Outpatient colonoscopy exam at her convenience.  2. Repeat screening laboratory tests including magnesium and zinc      levels.  3. Have empirically placed her on Lialda 2.4 grams a day on the      assumption that she may have some segmental colitis in the      rectosigmoid area related to her chronic diverticulosis.  4. Continue multiple other medications as listed on her record.   ADDENDUM:  The patient, in the past, has been treated with Lomotil and  Imodium without response.  An attempt to treat her with Paregoric was   unsuccessful because she could not tolerate the taste of this  medication.   ALLERGIES:  PENICILLIN, AUGMENTIN, TAGAMET,  MORPHINE, SULFA, IVP DYE  and FLAGYL.     [name]. Jarold Motto, MD, [facility], FAGA  Electronically Signed    DRP/MedQ  DD: 07/07/2007  DT: 07/08/2007  Job #: [id number]   cc:   Gregary Signs A. Everardo All, MD  Rollene Rotunda, MD, [facility]

## 2010-10-14 NOTE — Letter (Signed)
December 16, 2006    Currie Paris, M.D.  [address], [address]  [place], [place] [address]   RE:  [name], [name]  MRN:  [id number]  /  DOB:  [birth year]   Dear Thayer Ohm:   I would appreciate if you could see [name] for consideration of  placing a central line for hyperalimentation purposes. Enclosed is my  note of November 19, 2006, which details her recent predicaments with short  bowel syndrome. She has had multiple surgical procedures at [facility] and in [place] and has only 3 feet of small intestine  remaining.   She has multiple adhesions in her abdomen and has chronic pain syndrome  and only responds to narcotics. Even narcotics do not slow down her  diarrhea and she has tried almost every therapy including paregoric. She  has multiple metabolic imbalances and is followed by myself and Dr.  Everardo All. She continues to complain of severe unrelated abdominal pain,  nausea, has continued anorexia, weight loss, and diarrhea that has been  refractory to treatment most recently with Xifaxan 400 mg t.i.d. along  with Align probiotic therapy. A recent check of her electrolyte panel  showed she was mildly hyperkalemic and we elevated her potassium  replacement therapy.   Today, she weighs 185 pounds and her previous weights were in the 200  pound ranges. Today, she is down some 5 pounds from the last clinic  visit. Her blood pressure is 118/78 and pulse 92 and regular. She has a  very scarred abdomen, but no significant distention, masses or  tenderness today.   I would appreciate if you could see [name] for consideration of placement  of a Port-A-Cath so we can give her hyperalimentation in the evening. If  you could help with this management, this would also be appreciated. I  did send her by the lab today to check a zinc level since recent reports  have shown some refractory diarrhea in this situation with low zinc  levels. I have renewed her prescription for  Dilaudid 4 mg every 8-12  hours and will continue all of her other multiple medications and  vitamin and mineral replacement therapy. Thank you for your time and  help with this matter.    Sincerely,      [name]. Jarold Motto, MD, [facility], FAGA  Electronically Signed    DRP/MedQ  DD: 12/16/2006  DT: 12/16/2006  Job #: [id number]   CC:    Gregary Signs A. Everardo All, MD

## 2010-10-14 NOTE — H&P (Signed)
[name], VIOLANTE NO.:  [id number]   MEDICAL RECORD NO.:  [id number]          PATIENT TYPE:  INP   LOCATION:  6713                         FACILITY:  [facility]   PHYSICIAN:  Barbette Hair. Arlyce Dice, MD,FACGDATE OF BIRTH:  1946-05-02   DATE OF ADMISSION:  05/17/2008  DATE OF DISCHARGE:  05/22/2008                              HISTORY & PHYSICAL   HISTORY OF PRESENT ILLNESS:  [name] is a 65 year old black female  known to Dr. Sheryn Bison, in our office for history of multiple  abdominal surgeries, adhesions, small bowel obstruction and short bowel  syndrome.  The patient presented to our office this morning with a 3-day  history of right-sided abdominal pain radiating around to the right mid  back as well into the right pelvis.  Pain was associated with nausea and  vomiting.  Pain was not related to meals or defecation.  Dr. Jarold Motto  sent the patient over to Dr. Belva Crome office, the patient's urologist for  evaluation of possible kidney stones as source of the flank pain.  CT of  the abdomen and pelvis was done at [facility] and they called our  office to report what was like an inflammatory mass in the right  anterior abdomen adjacent to the colon.  Urologist sent [name] over  to the emergency department over [place] to further evaluate and probably  admit to the hospital.  Unfortunately, the emergency department is quite  busy and I am unable to bring the patient back to the [facility]  Emergency Department as it is quite busy this evening and the patient is  presently in the waiting room.  At this time, the patient is [name]  Emergency Department waiting area.  Because of her CAT scan findings and  abdominal pain, the patient will be admitted by [place] to the hospital.  There are no rooms available at [facility] at this moment and the  patient is too uncomfortable to go home and wait for an available room.  She will be admitted to [facility]  as there is a room available  for her.   PAST MEDICAL HISTORY:  1. Diabetes.  2. Hypertension.  3. History of diverticulitis.  4. History of multiple abdominal surgeries.  5. Adhesions.  6. Small bowel obstruction.  7. Short bowel syndrome.  8. History of cholecystectomy.  9. Total abdominal hysterectomy.  10.Hyperthyroidism.   FAMILY MEDICAL HISTORY:  Coronary artery disease and diabetes.   SOCIAL HISTORY:  The patient is married.  Former smoker.  No history of  alcohol abuse.   MEDICATIONS:  Home medications include:  1. Folic acid 1 mg daily.  2. Klor-Con 20 mEq 3 times a day.  3. Calcium 600/vitamin D 600-200 mg tablets 1 daily.  4. Lasix 20 mg daily.  5. Premarin 0.625 mg daily.  6. Vitamin B12 controlled release 1000 mcg 1 shot every month.  7. Trazodone 150 mg at bedtime.  8. Toprol-XL 25 mg once daily.  9. Dilaudid 4 mg as needed.  10.Protonix 40 mg twice daily.  11.Promethazine 25 mg as needed.  12.Cozaar 50 mg  daily.  13.Lovastatin 20 mg daily.   ALLERGIES:  1. CODEINE.  2. MORPHINE.  3. PENICILLIN.  4. AUGMENTIN.  5. SULFA.  6. FLAGYL.  7. IV DYE.   REVIEW OF SYSTEMS:  Abdominal pain.  Chronic diarrhea.  Nausea and  vomiting.  All other review of systems are negative other than were  noted in the HPI.   PHYSICAL ASSESSMENT:  Unable to examine the patient as she is currently  in the emergency department waiting room and there is no private space  available for examination.  Her physical examination per Dr. Norval Gable  office notes this morning is as follows,  VITAL SIGNS:  Blood pressure 118/82, heart rate 80.  GENERAL:  Well-developed female, in no acute distress.  HEENT:  Head normocephalic and atraumatic.  Eyes:  PERRLA, no icterus.  LUNGS:  Clear to auscultation.  HEART:  Regular rate and rhythm.  No murmurs, rubs, or bruits.  ABDOMEN:  There are multiple well-healed surgical scars about her  abdomen.  There is no abdominal distention and bowel  sounds are not  obstructive in nature.  There is tenderness in the right upper quadrant  and right flank area without a definite mass noted.  RECTAL:  Deferred.  EXTREMITIES:  No clubbing, cyanosis, edema, or deformities noted.  NEUROLOGIC:  Awake, alert, and oriented x4, grossly normal  neurologically.  PSYCH:  Alert and cooperative.  Normal mood and affect.   LABORATORY STUDIES:  To be ordered.   DIAGNOSTIC STUDIES:  The patient's CT of the abdomen and pelvis done at  [facility] today is not currently available to me.  A verbal  report from their office described a probable inflammatory mass in the  right anterior abdomen adjacent to the colon.  The patient does have her  films with her on a CT brought from the urologist office.  Dr. Arlyce Dice  will review the CT and document any additional findings.   IMPRESSION:  1. Acute right flank pain radiating to the right pelvis and associated      with nausea and vomiting.  Urology workup negative today.  CT scan      shows a probable right intra-abdominal inflammatory mass adjacent      to the colon.  Full report of the CT scan not yet available.  Rule      out diverticular abscess in this patient with known history of      severe diverticular disease.  2. History of multiple small bowel obstructions related to adhesions.      Has had several lysis of adhesions.  3. Short bowel syndrome, secondary to #2.  4. History of esophageal stricture, May 2009.  5. Diabetes, diet controlled.  6. Hypertension.  7. History of hyperthyroidism.  8. Known history of nephrolithiasis.  9. Dyslipidemia, on statin.   PLAN:  1. Will admit to pain control, antiemetics and further evaluation.      Antibiotics will be started accordingly once the full report of the      CT of the abdomen and pelvis becomes available.  2. Hold most of the patient's home medications for now given nausea      and vomiting.  3. Accu-Chek.  4. IV fluids.      Willette Cluster, NP      Barbette Hair. Arlyce Dice, MD,FACG  Electronically Signed    PG/MEDQ  D:  05/30/2008  T:  05/31/2008  Job:  [id number]

## 2010-10-14 NOTE — Assessment & Plan Note (Signed)
[facility]                         GASTROENTEROLOGY OFFICE NOTE   DALEISA, HALPERIN                  MRN:          [id number]  DATE:11/19/2006                            DOB:          February 13, 1946    [name] returns to my office today for GI followup after being  admitted to the hospital on June 9 by Dr. Russella Dar, being taken care of by  Dr. Loreta Ave over the weekend of that week, and having essentially no  discharge summary available for review.  The patient says there was  nothing done for me during my hospitalization, and she returns today  complaining of profuse watery diarrhea, fatigue, and general malaise.  Amy Esterwood, PA-C, who did her history and physical exam is not  available for review.  Dr. Russella Dar does not remember anything about the  patient, and Dr. Loreta Ave is not available.   All I have is a history and physical exam from November 08, 2006.  This  patient is well-known to me and all GI doctors because of multiple  abdominal surgeries and bowel resections with multiple adhesions and  recurrent episodes of small bowel obstruction.  She has suffered from  short-bowel syndrome and has had problems with nutrition and electrolyte  balance, and is followed rather closely by Dr. Everardo All.  It is unclear  to me whether or not the patient has seen Dr. Everardo All since her  discharge.  She suffers from hypertension, hyperlipidemia, has a thyroid  nodule that was recently biopsied, and has chronic pain syndrome, for  which she is currently taking Dilaudid 4 mg 2 to 3 times a day.  She is  on a list of at least 15 medications, listed and reviewed in the chart,  and these do include potassium and calcium replacement, and PTU 50 mg a  day.  She has hypertension and is on multiple antihypertensives.   On review of the patient's chart, she has had colonoscopy last done in  February of 2007, which was unremarkable, except for some left colon  diverticulosis.  Endoscopy at that time also was unremarkable, without  any evidence of peptic ulcer disease or H. pylori infection.  The  patient recently had CT scan of the abdomen during her hospitalization,  which showed very nonspecific findings, which are difficult to  interpret.  There are multiple scars and soft tissue swelling of the  abdomen and diffuse omental thickening on the scan with some prominent  small bowel loops, but no definite abnormalities.  There are also  multiple surgical clips in the abdomen.  It is of note that her blood  count showed no evidence of significant leukocytosis, or change in her  chronic anemia with a hemoglobin of around 10.  She was hypokalemic on  admission, and this apparently was corrected.  Her liver function tests  and urinalysis were normal.   It is difficult to tell in speaking with the patient and reviewing her  chart whether or not she has been on multiple antibiotics or not.   EXAM:  Shows a nontoxic-appearing black female who is wheelchair bound.  She weighs 188 pounds,  which is down 10 pounds from her normal weight.  Blood pressure 102/82, and pulse was 80 and regular.  She does not  appear to be significantly dehydrated.  Exam of her abdomen sitting in a wheelchair, she had no distension,  masses, or significant tenderness.   ASSESSMENT:  [name] has short bowel syndrome and may well have an  element of bacterial overgrowth at this time.  It has been estimated as  per my previous notes she has 3 feet of small bowel remaining.  It has  been my opinion that the patient may well need enteral hyperalimentation  at nighttime, but this has never been completed.   RECOMMENDATIONS:  1. Check an electrolyte panel.  2. Trial of Xifaxan 40 mg t.i.d. for 10 days, along with Align      probiotic therapy.  3. Consider referral to surgery for Port-A-Cath placement for chronic      hyperalimentation at bedtime.     [name]. Jarold Motto,  MD, Caleen Essex, FAGA  Electronically Signed    DRP/MedQ  DD: 11/19/2006  DT: 11/19/2006  Job #: [id number]   cc:   Gregary Signs A. Everardo All, MD

## 2010-10-14 NOTE — Assessment & Plan Note (Signed)
[facility]                         GASTROENTEROLOGY OFFICE NOTE   MARYPAT, KIMMET                  MRN:          [id number]  DATE:09/27/2007                            DOB:          06/13/1945    SUBJECTIVE:  [name] has continued diarrhea but denies abdominal pain or  rectal bleeding.  She saw Dr. Hermelinda Medicus who thinks that a lot of  her dysphagia is from her thyroid goiter, but he has requested a repeat  endoscopic exam that was done two years ago.  Gwen is on daily Protonix  for acid reflux.  She has short bowel syndrome and is on magnesium  tablets daily, four to six Imodium tablets daily and p.r.n. hydrocodone.  Previous attempts to treat her with paregoric were limited, because of  her complaints of severe burning abdominal pain with this medication.   She has had hyperthyroidism and has had plans for radioactive iodine  therapy to her goiter, but she has refused.  She is followed by Dr. Gregary Signs  A. Everardo All.  It is certainly possible that some of her diarrhea is  related to her thyroid dysfunction.  Actually on reviewing her chart,  her weight has been stable over the last year, despite the fact that she  has a low serum carotene and has known short bowel syndrome.   She also suffers from other medical problems, including osteoarthritis,  hypertension, hyperlipidemia, obesity, lower extremity varicose veins,  recurrent diverticulosis, intermittent hemorrhoidal bleeding and a  chronic pain syndrome.   PHYSICAL EXAMINATION:  VITAL SIGNS:  Today were all normal.  GENERAL:  The physical exam is not repeated.   RECOMMENDATIONS:  1. Repeat labs including thyroid functions and magnesium level.  2. Schedule an endoscopic exam and possible dilatation.  3. Repeat thyroid function tests.  I will send this note to Dr.      Everardo All.  4. Trial of Xifaxan 400 mg three times daily, along with probiotic      therapy daily for 10 days.     [name]. Jarold Motto, MD, Caleen Essex, FAGA  Electronically Signed    DRP/MedQ  DD: 09/27/2007  DT: 09/27/2007  Job #: [id number]   cc:   Gregary Signs A. Everardo All, MD

## 2010-10-14 NOTE — Assessment & Plan Note (Signed)
[facility]                         GASTROENTEROLOGY OFFICE NOTE   KYNSLIE, RINGLE                  MRN:          [id number]  DATE:07/29/2007                            DOB:          07-17-1945    Gwen's colonoscopy was unremarkable, without evidence of segmental  colitis.  Actually, a trial of Lialda worsened her diarrhea.  She  currently is doing well and denies abdominal pain or significant change  in her stooling pattern.   Her magnesium level was low and she is on slow magnesium daily.  Her  electrolytes were generally unremarkable, as was her liver profile, with  an albumin of 3.7 g%.  However, I repeated her TSH level, which was even  lower at 0.3 and I will send her back to the lab for repeat exam and  also T4 levels.   I have made no real change in her medications, but will see her on a  p.r.n. basis or every two months as needed with p.r.n. followup per Dr.  Everardo All as needed.  This patient has short-bowel syndrome and is on  multiple mineral and vitamin replacements.  I have also suggested to her  that she use one can of Boost enteral supplements twice a day.     [name]. Jarold Motto, MD, Caleen Essex, FAGA  Electronically Signed    DRP/MedQ  DD: 07/29/2007  DT: 07/29/2007  Job #: [id number]   cc:   Gregary Signs A. Everardo All, MD

## 2010-10-14 NOTE — H&P (Signed)
[name], [name]           ACCOUNT NO.:  [id number]   MEDICAL RECORD NO.:  [id number]          PATIENT TYPE:  INP   LOCATION:  1310                         FACILITY:  [facility]   PHYSICIAN:  Rachel T. Russella Dar, MD, FACGDATE OF BIRTH:  [birth year]   DATE OF ADMISSION:  11/08/2006  DATE OF DISCHARGE:                              HISTORY & PHYSICAL   CHIEF COMPLAINT:  Nausea and vomiting x4 days with inability to keep  down oral intake, left lower quadrant pain and loose stools.   HISTORY:  This is a pleasant, 65 year old, African American female known  to Dr. Sheryn [name], primary patient of Dr. Debby [name], who has had  recurrent diverticulitis.  She has also had multiple abdominal surgeries  including total abdominal hysterectomy, BSO in 1990, cholecystectomy in  1994, and lysis of adhesions on in 1980, 1990, and 1994.  She has had in  the past fairly chronic problems with abdominal pain.  She also has a  thyroid nodule which is currently being investigated.  She had a biopsy  last week. She has history of hypertension, obesity, and dyslipidemia.  She has been under a lot of stress recently.  Her son recently passed  away from esophageal cancer, and she was his primary caregiver.   The patient says she has had chronic problems with loose stools over the  past couple of years and has noticed some increase in her bowel  movements over the past week, though no melena or hematochezia.  She  started having problems with nausea about 5 days ago and says that she  has been vomiting on a daily basis.  She has oral Phenergan to take at  home but despite this has been unable to keep anything down over the  past 3 days.  She has not had any documented fever or chills.  She says  she is vomiting shortly after any p.o. consumption.  She is having left  lower quadrant pain, which she reports feels like her previous  diverticulitis, though this is not severe pain.  She denies any  abdominal  distension.  The patient was seen and evaluated in the office  today.  She does not appear toxic, however, with a 3-day history of  inability to keep down p.o.'s despite oral Phenergan.  She needs to be  admitted to the hospital for hydration, antiemetics, and further  diagnostic evaluation.   CURRENT MEDICATIONS:  1. Aciphex 20 mg b.i.d.  2. Cozaar 50 daily.  3. Folic acid 1 mg daily.  4. KCl 20 mEq t.i.d.  5. Calcium 600 daily.  6. Lovastatin 20 daily.  7. Furosemide 20 daily.  8. Premarin 0.625 daily.  9. B12 supplement once a month.  10.PTU 50 mg daily.  11.Trazodone 75 q.h.s.  12.Toprol XL 25 daily.   ALLERGIES/INTOLERANCE:  1. MORPHINE.  2. IVP DYE.  3. ASPIRIN.   PAST HISTORY:  As outlined above.   SOCIAL HISTORY:  The patient is married, lives with her husband, had  been living with her son as well who recently died.  She is a nonsmoker,  nondrinker.   FAMILY HISTORY:  Negative for colon cancer or polyps.   REVIEW OF SYSTEMS:  CARDIOVASCULAR:  Denies any chest pain or anginal  symptoms.  PULMONARY:  The patient has had some sputum production and  also complains of some hoarseness both of which she attributes to her  enlarged thyroid.  GI:  As above.  GU:  Negative.  The patient does  mention that she has some occasional stool incontinence at night.  MUSCULOSKELETAL:  Negative currently.  NEURO:  Negative.  PSYCH:  The  patient understandably somewhat depressed since her son's passing.   LABORATORY STUDIES:  Pending.   IMPRESSION:  82. A 65 year old African American female with a 4 to 5-day history of      nausea, vomiting, and lower abdominal pain, unclear whether this is      a gastroenteritis type picture or recurrent diverticulitis, versus      a partial small-bowel obstruction, though doubt.  2. History of a previous small-bowel obstructions and lysis of      adhesions on three occasions.  3. Multiple abdominal surgeries as outlined above.  4. History of  diverticulitis.  5. Hypertension.  6. Dyslipidemia.  7. Thyroid nodule, recent biopsy, workup in progress.   PLAN:  The patient is admitted for IV fluid hydration, antibiotics,  bowel rest, CT scan of the abdomen and pelvis, and for further details  please see the orders.      Rachel Esterwood, PA-C      Rachel T. Russella Dar, MD, [facility]  Electronically Signed    AE/MEDQ  D:  11/08/2006  T:  11/08/2006  Job:  [id number]

## 2010-10-15 ENCOUNTER — Ambulatory Visit (INDEPENDENT_AMBULATORY_CARE_PROVIDER_SITE_OTHER): Payer: Medicare Other | Admitting: *Deleted

## 2010-10-15 DIAGNOSIS — E538 Deficiency of other specified B group vitamins: Secondary | ICD-10-CM

## 2010-10-15 MED ORDER — CYANOCOBALAMIN 1000 MCG/ML IJ SOLN
1000.0000 ug | Freq: Once | INTRAMUSCULAR | Status: AC
  Administered 2010-10-15: 1000 ug via INTRAMUSCULAR

## 2010-10-17 NOTE — H&P (Signed)
[facility]  Patient:    [name], [name] Visit Number: [id number] MRN: [id number]          Service Type: Attending:  Justine Null, M.D. [facility] Dictated by:   Justine Null, M.D. [facility] Adm. Date:  06/08/01                           History and Physical  REASON FOR ADMISSION:  Abdominal pain.  HISTORY OF PRESENT ILLNESS:  The patient is a 65 year old woman with one day of moderate severity of pain over the entire abdomen.  There is associated nausea, vomiting, and diarrhea.  PAST MEDICAL HISTORY: 1. Type 2 diabetes. 2. Menopause. 3. Hypertension. 4. Hyperthyroidism. 5. Osteoarthritis. 6. Insomnia. 7. Gastroesophageal reflux disease. 8. Dyslipidemia.  MEDICATIONS:  1. Lopid 600 mg b.i.d.  2. Potassium chloride 10 mEq q.d.  3. Norvasc 10 mg q.d.  4. Glucophage XR 1000 mg q.d.  5. Premarin 0.9 mg q.d.  6. PTU 50 mg three times a week.  7. Atenolol 25 mg q.d.  8. Aciphex 20 mg q.d.  9. Cozaar 100 mg q.d. 10. Darvocet-N 100 p.r.n. pain. 11. Desyrel 50 mg q.h.s. 12. Lasix 40 mg q.d.  SOCIAL HISTORY:  The patient does not smoke, she does not drink alcohol.  She is married, her husband is here.  FAMILY HISTORY:  No one else at home is ill.  REVIEW OF SYSTEMS:  Denies the following:  Fever, chills, constipation, bright red blood per rectum, hematuria, chest pain, shortness of breath, skin rash, loss of consciousness, vaginal bleeding, and excessive diaphoresis.  PHYSICAL EXAMINATION:  VITAL SIGNS:  Blood pressure 115/80, heart rate 74, respiratory rate 18, the patient is afebrile.  GENERAL:  Morbidly obese, no distress.  SKIN:  Not diaphoretic.  NODES:  There is no palpable lymphadenopathy.  HEENT:  Head is atraumatic, sclerae nonicteric, pharynx clear.  NECK:  Supple.  CHEST:  Clear to auscultation.  CARDIOVASCULAR:  No JVD, no edema, regular rate and rhythm, no murmur.  ABDOMEN:  Soft, obese (the obesity limits the  examination).  There is slight diffuse abdominal tenderness which is probably slightly worse on the left side.  Bowel sounds are normal pitch and normal intensity.  The abdomen is minimally distended if at all.  BREASTS:  Not done at this time due to the patients discomfort, and they add nothing to the evaluation.  GYNECOLOGICAL:  Not done at this time due to the patients discomfort, and they add  nothing to the evaluation.  RECTAL:  Not done at this time due to the patients discomfort, and they add nothing to the evaluation.  EXTREMITIES:  No obvious deformity of the joints.  NEUROLOGIC:  Alert, well oriented.  Cranial nerves II-XII grossly intact.  The patient moves all fours.  She is examined in the supine position only.  LABORATORY DATA:  CBC and CMET are normal.  CT of the abdomen shows low grade small bowel obstruction.  IMPRESSION: 1. Low grade small bowel obstruction which is probably resulting from a recent    viral infection, as well as adhesions from an old hysterectomy. 2. Other chronic medical problems as noted above.  PLAN: 1. Symptomatic therapy. 2. Clear liquid diet. 3. Discontinue Glucophage and Premarin in this situation. 4. Will also hold potassium, Norvasc, Lopid, Cozaar, Lasix, and Desyrel, as    well as Darvocet due to current symptoms. 5. Recheck TSH. 6. We will monitor blood pressure and  glucose. Dictated by:   Justine Null, M.D. [facility] Attending:  Justine Null, M.D. [facility] DD:  06/08/01 TD:  06/08/01 Job: [id number]

## 2010-10-17 NOTE — Discharge Summary (Signed)
[facility]  Patient:    [name], [name]                  MRN: [id number] Adm. Date:  [id number] Disc. Date: [id number] Attending:  Starr Sinclair Dictator:   Silvio Pate, P.A.-C CC:         Catalina Lunger, M.D.             Justine Null, M.D. [facility]             [name]. Jarold Motto, M.D. [facility]                           Discharge Summary  ADMITTING DIAGNOSES:  1. Recurrent left-sided abdominal pain, associated with left costovertebral     angle tenderness, nausea and vomiting.  Likely due to a partial     small-bowel obstruction.  No evidence of a urinary tract infection,     ureteral lithiasis, or diverticulitis at this time.  2. Adult-onset diabetes mellitus.  3. Hypertension.  4. Generalized pruritus.  Question secondary to ciprofloxacin which was     started by Dr. Russella Dar in the emergency room.  DISCHARGE DIAGNOSES:  1. Recurrent partial small-bowel obstruction secondary to adhesions -     resolving.  2. Adult-onset diabetes mellitus (type 2) - stable.  3. Hypertension - stable.  4. Generalized pruritus - resolved.  Likely secondary to ciprofloxacin.  5. Diverticular disease with history of diverticulitis.  6. History of hyperthyroidism - stable.  7. History of nonspecific anemia.  8. History of depression.  PROCEDURES:  None.  CONSULTATIONS:  None.  BRIEF HISTORY:  [name] is a 65 year old African-American female, with a history of chronic abdominal pain.  She has extensive adhesions owing to multiple prior abdominal and pelvic surgeries.  She also has a history of diverticulosis with diverticulitis.  Chronic constipation has recently been well controlled with fiber supplements.  She was admitted in December 2000 with similar symptoms.  In addition, she had been admitted in August 2000 and was treated for what was diagnosed as diverticulitis, although the CT scan of the abdomen and pelvis at that time was not  confirmatory for diverticulitis. She has had a surgical evaluation in the past by Dr. Catalina Lunger for her recurrent abdominal pain.  However, because of the severe nature of very dense adhesions, she is not felt to be a good surgical candidate for lysis of adhesions.  Her most recent colonoscopy was in March 1998, which showed diverticulosis and hemorrhoids.  She presented on the day of admission with acute left upper quadrant pain which radiated to the left flank and was associated with left costovertebral angle pain.  The pain progressed and within several hours she was having nausea and vomiting, of which she had experienced two episodes.  This was without coffee-ground or heme material. An abdominal film was performed while she was in the emergency room that showed scattered air/fluid levels and a few areas with increase in small-bowel gaseous distention.  Gas was also noted in the colon.  There was no free air. She was evaluated by Dr. Claudette Head who elected to admit her for pain management, bowel rest, and a CT scan for further evaluation as to the source of her pain.  LABORATORY:  April 29:  White count 7.9, hemoglobin 11.1, hematocrit 33.1. MCV 86.0 and platelet count of 349.  Sodium 140, potassium 4.4, chloride 109, CO2 24,  glucose 167, BUN 14, creatinine 0.9, calcium 9.6, total protein 7.1, albumin 3.7, AST 25, ALT 8, alk phos 86, total bili 0.4, amylase 93, and lipase 32.  Urinalysis, as well as urine culture was negative.  X-RAYS:  CT scan of the abdomen and pelvis (September 28, 1999).  Acute abdomen with test (September 29, 1999).  One view of the abdomen (Sep 30, 1999 and Oct 01, 1999).  HOSPITAL COURSE:  [name] was admitted to the service by Dr. Claudette Head.  She was placed on bowel rest and given IV fluids for hydration.  She was given Demerol and Phenergan for pain and nausea.  She was continued on her home medications of Cozaar, as well as  propylthiouracil.  She was also given IV Pepcid.  Cipro was started empirically for possible urinary tract infection.  CT of the abdomen and pelvis was performed and showed dilated distal small-bowel loops at the pelvis, with adhesions, consistent with a partial small-bowel obstruction.  Of note, contrast was able to transverse the whole colon.  Subsequent urinalysis and urine culture were negative and her ciprofloxacin was discontinued at this time.  A nasogastric tube was placed. The following morning of April 30 she was feeling slightly better.  She was having no nausea since her NG tube was placed.  Her abdominal pain was somewhat improved.  A repeat abdominal film showed an intestinal gas pattern that was within normal limits.  There was also residual contrast from the earlier CT scan.  [name] continued to complain of abdominal pain, although subsequent x-rays showed improvement.  Her nasogastric tube was taken out on May 1 and she was placed on a clear liquid diet.  Several attempts were made to perform a small-bowel follow-through but were hindered by retained contrast from her previous CT scan.  On May 2 she had tolerated her liquid diet without difficulty.  She has passed a bowel movement, as well as gas. Her diet was advanced which she continued to tolerate.  Given her small-bowel follow-through could not be performed to to retained contrast, it was scheduled to be completed as an outpatient.  On May 3 she was still tolerating her diet without difficulty.  She was feeling better.  Her vital signs were stable and she was afebrile.  She did complain of some residual left upper quadrant tenderness.  KUB the day before showed a gas pattern that was normal. Her small-bowel follow-through was set up to be performed as an outpatient on May 8, but she was unwilling to have it done at that time.  She stated that she had one done in the past and would prefer not to have a repeat one at  the present time.  This appointment was cancelled and she was told to discuss  further management with Dr. Jarold Motto when she sees him on May 22.  CONDITION ON DISCHARGE:  Improved.  DIET:  Soft, as tolerates.  MEDICATIONS:  1. Promethazine 25 mg p.o. q.6h. p.r.n. nausea.  2. Actos 15 mg p.o. q.d.  3. Aciphex 20 mg p.o. b.i.d.  4. Darvocet 1-2 tablets q.6h. p.r.n.  5. Cozaar 50 mg 2 tablets p.o. q.d.  6. Propylthiouracil 50 mg p.o. q.d.  7. Lasix 40 mg p.o. q.d.  8. Premarin 0.9 mg p.o. q.d.  9. Allegra D 1 p.o. b.i.d. p.r.n. 10. Gemfibrozil 600 mg p.o. b.i.d.  FOLLOWUP:  Dr. Sheryn Bison on Tuesday, May 22, at 10 a.m.  Approximate time to discharge the patient was 50  minutes. DD:  10/07/99 TD:  10/09/99 Job: [id number]

## 2010-10-17 NOTE — H&P (Signed)
[name], [name]                     ACCOUNT NO.:  [id number]   MEDICAL RECORD NO.:  [id number]                   PATIENT TYPE:  INP   LOCATION:                                       FACILITY:  [facility]   PHYSICIAN:  Sean A. Everardo All, M.D. [facility]           DATE OF BIRTH:  [birth year]   DATE OF ADMISSION:  08/12/2002  DATE OF DISCHARGE:                                HISTORY & PHYSICAL   REASON FOR ADMISSION:  Abdominal pain.   HISTORY OF PRESENT ILLNESS:  The patient is a 66 year old woman with one day  of severe generalized crampy quality of abdominal pain which was much worse  across the entire right abdomen than the left.  There is associated nausea,  vomiting, and chills.   PAST MEDICAL HISTORY:  1. Multiple surgeries for chronic abdominal pain.  2. Small-bowel obstruction, probably due to previous surgeries.  3. Hyperthyroidism.  4. Menopause.  5. Hypertension.   MEDICATIONS:  1. Desyrel 200 mg q.h.s.  2. Potassium chloride 10 mEq daily.  3. Lasix 40 mg daily.  4. Tylenol with codeine as needed for pain.  5. Darvocet-N 100 as needed for pain.  6. Lomotil as needed for diarrhea.  7. Ultrase MT 12 1 pill two to three times daily with meals.  8. Aciphex 20 mg daily.  9. Premarin 0.3 mg daily.  10.      Propylthiouracil 50 mg daily.  11.      Premarin 12.5-25 mg p.r.n.   FAMILY HISTORY:  No one else at home is ill.   SOCIAL HISTORY:  The patient does not smoke or drink alcohol.  Lives at home  with her husband.  She is married.   REVIEW OF SYSTEMS:  Denies the following:  Loss of consciousness, dysuria,  weight loss, sore throat, earaches, chest pain, shortness of breath,  hematuria, rectal bleeding, seizure, visual loss, and bruising.   PHYSICAL EXAMINATION:  VITAL SIGNS:  Blood pressure 105/64, heart rate is  84, respiratory rate is 16, patient is afebrile.  GENERAL:  Obese.  No distress.  SKIN:  Not diaphoretic.  HEENT:  Head is atraumatic.  Sclerae  nonicteric.  Pharynx clear.  NECK:  Supple.  CHEST:  Clear to auscultation.  CARDIOVASCULAR:  No JVD, no edema.  Regular rate and rhythm.  No murmur.  Pedal pulses are intact.  ABDOMEN:  Soft, obese.  There is slight tenderness and a moderate amount of  guarding over the entire right side of the abdomen.  The abdomen does not  appear distended.  BREASTS, GYNECOLOGICAL, RECTAL:  The patient appears to be too uncomfortable  to have these right now.  EXTREMITIES:  No obvious deformity of the joints.  NEUROLOGIC:  Alert, well oriented.  Cranial nerves appear to be grossly  intact, and the patient moves all fours.   LABORATORY DATA:  CT scan shows only ileus.   Hemoglobin 10.3.  Potassium 3.2.  Urinalysis negative.   IMPRESSION:  1. Exacerbation of chronic abdominal pain, probably due to adhesions.  It is     possible that a currently prevalent gastrointestinal virus is     contributing.  2. Anemia.  3. Hypokalemia.  4. The clinical significance of #2 and #3, if any, is uncertain.   PLAN:  1. Symptomatic therapy.  2. Bowel rest.  3. Cephulac to resolve any contributing constipation.  4. Workup anemia later if indicated.  5. Intravenous fluids.  6. Recheck laboratories tomorrow.  7. I discussed code status with the patient.  She states she wants full code     but would not want to be started nor maintained on artificial life-     support systems if there was not a reasonable chance of a functional     recovery.                                               Sean A. Everardo All, M.D. [facility]    SAE/MEDQ  D:  08/12/2002  T:  08/13/2002  Job:  [id number]

## 2010-10-17 NOTE — H&P (Signed)
NAME:  [name], PEYSER NO.:  [id number]   MEDICAL RECORD NO.:  [id number]                   PATIENT TYPE:  INP   LOCATION:  1826                                 FACILITY:  [facility]   PHYSICIAN:  Isla Pence, M.D. [facility]         DATE OF BIRTH:  1946/01/16   DATE OF ADMISSION:  03/10/2002  DATE OF DISCHARGE:                                HISTORY & PHYSICAL   IDENTIFYING STATEMENT:  The patient is a 65 year-old black female whose  primary care physician is Dr. [name] who has had a long-standing  history of chronic abdominal pain with a history of intermittent bowel  obstructions and small bowel obstructions and has had quite a few abdominal  surgeries with partial small bowel resection with adhesions.  She had most  recently undergone surgery at [facility] by Dr. Elpidio Eric in June 2003 when she had  removal of a small portion of large and small intestine.  She also had  adhesions lysed.  The patient was discharged a month later and then  readmitted for wound infection for about a week.  Since the surgery the  patient has noted continued problems with intermittent diarrhea and throwing  up and really these problems are not new for her and had been going on even  prior to this most recent surgery. However, over the past few days she has  noted increasing diarrhea, nausea and vomiting where she was getting up even  at night for this.  She has been unable to keep foods down sometimes  although at other times she has not been able to.  She denies any fever.  She denies any melena or hematochezia.  The patient tells me that she had  seen Dr. Elpidio Eric on follow-up in September 2003 and she was told to increase  Imodium AD for her diarrhea.  She was placed initially on the Imodium AD in  July 2003 at [place].  The patient apparently was taken off  her potassium  at [facility] and this is what the patient tells me; I do not have any of that  information from [place].   The patient has also noted some abdominal pain  today secondary to the nausea and vomiting.  The patient has Phenergan at  home that she can take as needed. She has not eaten out anywhere, has not  gone camping and she drinks bottled water.  No one else is also sick.  The  patient in addition has noticed some increasing heartburn over the past  week.  She does have a history of gastroesophageal reflux disease.  The  patient apparently had tried getting up from a chair today and felt so weak  and just flopped down onto the floor where she was found probably two hours  later by her sister.  In the ER the patient was given Phenergan 25 mg IM  times one and started on IV fluids and subsequently her potassium  came back  on the I-stat at less than 2.0.  Please note that the patient has continued  taking her Lasix in spite of her current symptoms.  The patient tells me  that she had seen Dr. Everardo All a couple of weeks ago and had laboratories and  she does not know the results of these.  She was also supposed to see Dr.  Elpidio Eric back in follow-up on October 24.   ALLERGIES:  CODEINE, MORPHINE, IVP DYE and SULFA.   MEDICATIONS:  1. Nexium 40 mg p.o. q.d. since June 2003; prior to that she was on Aciphex.  2. Darvocet-N 100 p.o. q.3h p.r.n.; at least that is what she said.  3. Propylthiouracil 50 mg p.o. q.d.; this was just restarted back     approximately two weeks ago and this was started by Dr. [name], her     primary care physician.  4. Trazodone 50 mg p.o. q.h.s.  5. Phenergan 25 mg p.o. q.4 to 6h p.r.n.  She does not have any suppository     forms of it.  6. Cozaar 50 mg p.o. q.d.  7. Lasix 40 mg p.o. q.d. for lower extremity edema.  8. Premarin 0.3 mg p.o. q.d.  9. Hyoscyamine 0.375 mg p.o. q.d. and this was started according to the     patient by Dr. [name] recently also.  10.      Norvasc 5 mg p.o. q.d.  11.      Imodium AD three tablets as needed.  12.      The patient was  taken off of Glucophage XR 500 mg a couple of weeks     ago when she had seen Dr. Everardo All.  Sugars apparently have been running     good and today was 106.  She normally checks it q. 2 a.m.   HEALTH MAINTENANCE ASSESSMENT:  She has had a colonoscopy in 2002 and also  an esophagogastroduodenoscopy.  Colonoscopy was done for hematochezia and  done by Dr. Jarold Motto who found internal hemorrhoids and diverticulosis.  Esophagogastroduodenoscopy results are unknown, but she has had other EGD's  before documenting gastroesophageal reflux disease from what I understand.   PAST MEDICAL HISTORY:  1. Chronic abdominal pain with a history of intermittent small bowel     obstruction.  2. History of gastroesophageal reflux disease.  3. History of diverticulosis.  4. History according to the chart of chronic constipation although she has     had diarrhea actually for quite a while now.  5. History of hyperthyroidism with recently being started on PTU again.  6. History of diabetes mellitus.  7. History of hypertension.  8. History of obesity.  9. History of depression according to the chart although the patient denies     it.  10.      History of hyperlipidemia.  11.      History of degenerative joint disease.  12.      According to the chart a history of anemia.  Her hemoglobin earlier     this year was around 10.3.  The patient is unaware of a diagnosis of     anemia.   PAST SURGICAL HISTORY:  1. Status post cholecystectomy.  2. Status post total abdominal hysterectomy with bilateral salpingo-     oophorectomy many years ago for fibroid tumors.  3. History of numerous abdominal surgeries with partial small bowel     resection each time secondary to adhesions.  Apparently, she has had a  total of five of these resections including the one in June 2003.  4. History of appendectomy many years ago.  5. History of ___________ obstruction. 6. History of cholecystectomy.   SOCIAL HISTORY:  She  has been married for the past 28 years.  This is her  second marriage.  She has three sons from her first marriage.  Work history:  She is on disability secondary to her numerous abdominal surgeries and  diarrhea.   EXERCISE:  She walks when she is able to.   HABITS:  She quit tobacco ten years ago. She quit alcohol many years ago.  She says she drank only socially on weekends.  She replied to that in terms  of smoking she had just smoked a pack every two days to a week.   FAMILY HISTORY:  Diabetes mellitus pretty extensively in all of her siblings  except for one.  She has a total of 10 siblings, nine of whom  have diabetes  mellitus.  Both of her parents have diabetes mellitus also.  Myocardial  infarction in the oldest brother who died at the age of 80 or 74.  Coronary  artery disease also in three of her sisters.  Breast cancer in a paternal  uncle and three of her paternal aunts have also had breast cancer.  There is  no colon cancer apparently in the family.  Father apparently died of a blood  clot.   REVIEW OF SYMPTOMS:  As per history of present illness.  She has had no  chest pain, shortness of breath, cough, frequency or urination or dysuria.  She has never been thirsty. She denies any ear pain and she denies  rhinorrhea.  She has had some mild sneezing.  She denies any slurred speech,  but has noted some tingling sensation in her lower extremities and the  tingling sensation is with her current symptoms.   PHYSICAL EXAMINATION:  VITAL SIGNS:  Admission blood pressure was 96/66,  temperature 97.8, pulse 102, respiratory rate 22.  GENERAL:  She is lying on a stretcher and does not appear to be in any  significant distress.  HEENT:  Sclerae anicteric.  Tongue looks slightly dry.  LUNGS:  Clear to auscultation bilaterally without any crackles or wheezes.  HEART:  Regular rate and rhythm without any murmur.  ABDOMEN:  She has well healed vertical and horizontal scars.  Bowel  sounds  are normal.  There are no high pitched sounds.  She is tender in all  quadrants, right greater than left.  The patient says none of this  tenderness is really new.  She has had them chronically.  RECTAL:  She has large external hemorrhoids.  They are non-thrombosed.  She  has moderate rectal tone and soft stool on the glove is noticed.  It was  just extremely trace positive on one of the boxes.  NEUROLOGIC:  She is alert and oriented.  Cranial nerves II-XII are intact to  confrontation.  Muscle strength is very weak all throughout in the lower  extremities.  They are really +2/5 in the upper extremities.  Her grip is  +4/5 bilaterally.  It is difficult to elicit her DTRs.   LABORATORY DATA:  Initial I-stat electrolytes:  Potassium less than 2.0,  sodium 145, chloride 107, C02 21, BUN 4, creatinine 0.6, glucose 110.  CBC showed a total white count of 10.4, hemoglobin 12.3, hematocrit 37.7,  platelet count 403,000, PMN's 76, lymphocytes 19, MCV 87.9, MCHC 32.7.  A  recheck of potassium was 1.8.  CPK was 5,519 with an MB of 11.2, MBI was  normal at 0.2, Troponin normal at 0.04.   Chest x-ray showed nothing acute.  CT of the head came back with no acute  changes; there was just mild atrophy.   EKG showed normal sinus rhythm, mild downsloping of the ST segments in the  anterolateral leads but really otherwise fairly unremarkable.   The abdominal series showed no obstruction and no free air.   ASSESSMENT AND PLAN:  1. Severe hypokalemia which would explain her overall generalized weakness.     Her hypokalemia is multifactorial and is secondary to her diarrhea,     nausea and vomiting with decreased intake and also with continued Lasix.     She certainly has some suggestion of short gut syndrome with numerous     bowel resections.  I do not know how many cm of her bowel have been     removed.  Thirdly with the partial large bowel resection, she would also     have trouble  concentrating the stool and therefore she would be losing     more fluid this way also. We will continue her on the IV fluids with     potassium.  If she is able to take oral supplementation of potassium, we     will go ahead and do this since it would be the quickest way of replacing     her potassium.  Since the abdominal films were fairly negative and     clinically I really do  not think she has any bowel obstruction, I am     going to go ahead and try her one time on Lomotil just to slow the gut     down and a little bit to slow the diarrhea down.  Once her potassium is     better and she is feeling a little stronger, we will get PT involved and     she may need short term rehab.  We will continue Phenergan and/or Zofran     for her nausea.  2. Increased CPK.  She has evidence of rhabdomyolysis although there is no     affect on the kidney. We will continue her on IV fluids secondary to     prolonged position of her being on the floor.  If we are able to start     another line of IV fluids, it would be good to get more hydration in.  3.     In regards to her history of hypertension, holding her Norvasc and Cozaar     secondary to her low blood pressure.  4. In regards to her hyperthyroidism we will hold the PTU because of her     current trouble with holding down some medications, but certainly there     is no reason to keep her off of this for a prolonged period of time.                                               Isla Pence, M.D. [facility]    RRV/MEDQ  D:  03/11/2002  T:  03/13/2002  Job:  [id number]   cc:   Gregary Signs A. Everardo All, M.D. [facility]

## 2010-10-17 NOTE — Consult Note (Signed)
NAMEMORENIKE, CUFF                     ACCOUNT NO.:  [id number]   MEDICAL RECORD NO.:  [id number]                   PATIENT TYPE:  INP   LOCATION:  5011                                 FACILITY:  [facility]   PHYSICIAN:  Ollen Gross. Vernell Morgans, M.D.              DATE OF BIRTH:  1945/07/28   DATE OF CONSULTATION:  08/13/2002  DATE OF DISCHARGE:                                   CONSULTATION   CHIEF COMPLAINT:  Abdominal pain.   HISTORY OF PRESENT ILLNESS:  [name] is a 65 year old African-American  female who has a long history of chronic abdominal pain, she has had  multiple surgeries for partial bowel obstructions, multiple small-bowel  resections.  She is well known to Dr. Daphine Deutscher on the surgery service.  She  presented to the hospital yesterday with one day's history of severe  generalized, crampy abdominal pain.  She had some nausea and vomiting and  chills associated with this.   REVIEW OF SYSTEMS:  She otherwise denies chest pain, shortness of breath,  dysuria.  Her other review of systems is unremarkable.   PAST MEDICAL HISTORY:  Significant for chronic abdominal pain, anemia,  hypertension, hypothyroidism.   PAST SURGICAL HISTORY:  Significant for multiple abdominal surgeries for  bowel obstructions and adhesions, cholecystectomy.   MEDICATIONS:  Trazodone, potassium, calcium carbonate, multivitamins,  codeine, propylthiouracil, Phenergan, Premarin.   ALLERGIES:  PENICILLIN, SULFA, ASPIRIN, CODEINE, IV DYE, AND MORPHINE.   SOCIAL HISTORY:  She denies any use of alcohol or tobacco products.   FAMILY HISTORY:  Noncontributory.   PHYSICAL EXAMINATION:  VITAL SIGNS:  She is currently afebrile with stable  vitals.  GENERAL:  She is a well-developed, well-nourished black female in no acute  distress.  SKIN:  Warm and dry with no jaundice.  EYES:  Extraocular muscles are intact, pupils equal, round and reactive to  light.  LUNGS:  Clear bilaterally.  ABDOMEN:  Soft  with some mild to moderate tenderness in the right lower  quadrant but no peritoneal signs or guarding.  EXTREMITIES:  No clubbing, cyanosis or edema.  PSYCHIATRIC:  She is alert and oriented x3.   LABORATORY DATA:  White count 4500.  On review of her CT scan, she has some  inflammatory changes of her distal small bowel and the right lower quadrant  but no evidence for appendicitis, no appendix was identified.    ASSESSMENT AND PLAN:  This is a 65 year old black female with chronic  abdominal pain and some inflammatory changes of her distal small bowel.  She  has no signs of peritonitis at this time and does not require urgent  exploration.  I would treat her with bowel rest and IV antibiotics and  follow her clinical course.  We will consult with Dr. Daphine Deutscher tomorrow and  have him follow along with you.  Ollen Gross. Vernell Morgans, M.D.    PST/MEDQ  D:  08/13/2002  T:  08/14/2002  Job:  [id number]   cc:   Gregary Signs A. Everardo All, M.D. [facility]

## 2010-10-17 NOTE — Discharge Summary (Signed)
[name], [name]           ACCOUNT NO.:  [id number]   MEDICAL RECORD NO.:  [id number]          PATIENT TYPE:  INP   LOCATION:  1310                         FACILITY:  [facility]   PHYSICIAN:  Barbette Hair. Arlyce Dice, MD,FACGDATE OF BIRTH:  1945/08/03   DATE OF ADMISSION:  11/08/2006  DATE OF DISCHARGE:  11/14/2006                               DISCHARGE SUMMARY   ADMITTING DIAGNOSES:  34. A 65 year old African American female with a 4-5-day history of      nausea, vomiting and lower abdominal pain, rule out gastroenteritis      versus recurrent diverticulitis versus partial small-bowel      obstruction, though doubt.  2. History of multiple previous small bowel obstructions and lysis of      adhesions on three separate occasions.  3. Multiple abdominal surgeries including hysterectomy, bilateral      salpingo-oophorectomy, and a cholecystectomy.  4. History of diverticulitis.  5. Hypertension  6. Dyslipidemia.  7. Thyroid nodule with recent biopsy, workup in progress.   DISCHARGE DIAGNOSES:  1. Lower abdominal pain and diarrhea with minimal inflammatory changes      around the descending and sigmoid colon, probable mild      diverticulitis.  2. Chronic component to diarrhea, question overlap      diverticulitis/colitis-type picture.  3. Thyroid goiter.  4. Other diagnoses as listed above.   CONSULTATIONS:  None.   PROCEDURES:  CT scan of the abdomen and pelvis, chest x-ray, and CT of  the chest.   BRIEF HISTORY:  [name] is a 65 year old African American female known  to Dr. Jarold Motto with history as outlined above, also known to Dr.  Debby Bud.  At this time she relates that she has had chronic problems with  loose stools over the past couple of years, has noticed some increase in  her bowel movements over the past week though no melena or hematochezia.  About 5 days prior to admission she started having nausea and then  vomiting on a daily basis.  She had tried oral Phenergan  at home without  benefit and says she has been unable to keep anything down over the past  3 days.  She has not had any documented fever or chills and was vomiting  shortly after any p.o. consumption.  She also complains of left lower  quadrant pain, which to her feels like her previous diverticulitis  though she admits it is not severe pain.  She was seen and evaluated in  the office and did not appear toxic; however, with 3-day history of  vomiting despite oral Phenergan it was felt that admission was warranted  for hydration, antiemetics and further diagnostic evaluation.  The  patient also admits she has been under a lot of stress recently.  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   LABORATORY STUDIES:  On admission, WBC of 4.8, hemoglobin 10, hematocrit  of 30, MCV of 88, platelets 314.  Follow-up on June 15:  WBC of 4.4,  hemoglobin 10.2, hematocrit of 30.5.  Potassium  2.8 on admission.  Follow-up on the 11th is 3.6.  Albumin 3.3.  Liver function studies  normal.  UA negative.   CT of the abdomen and pelvis on June 9 showed increasing ventral  peritoneal soft tissue, likely representing scar and adhesions with  diffuse omental thickening, probable adhesions of several loops of small  bowel, at least one and a low anterior pelvis, which demonstrated some  wall thickening but no obstruction, and minimal inflammatory change  around the junction of the descending and sigmoid colon, where there are  multiple surgical clips noted.  No other inflammatory changes seen.  Chest x-ray showed some mild right paratracheal fullness, consider CT of  the chest.  CT of the chest showed right paratracheal density due to a  dilated azygos vein, no adenopathy apparent.  There was asymmetric  enlargement of the left lobe of the thyroid with a heterogeneous nodule.   HOSPITAL COURSE:  The patient was  admitted to the service of Dr. Claudette Head and then cared for by Dr. Arlyce Dice and then Dr. Loreta Ave.  She was  placed on a liquid diet, IV Unasyn at 1.5 g q.6h, pain control as needed  and antiemetics.  She underwent CT of the abdomen and pelvis with  findings as outlined above.  It was felt that she probably did have some  mild diverticulitis superimposed on her chronic adhesive disease, also  wondered about the possibility of an overlapping diverticulitis/colitis-  type picture with her chronic diarrhea.  The patient was also concerned  about her thyroid.  She had had a recent thyroid biopsy, which did  return benign consistent with a goiter.  The patient was insistent on  having internal medicine see her while she was in the hospital in the  event that anything else needed to be done.  Her pain gradually settled  down.  We advanced her diet.  She was seen by Dr. Jonny Ruiz for internal  medicine.  He did not feel that she needed any further inpatient workup  and that she should follow up as an outpatient with Dr. Everardo All to  discuss whether she needed to be referred for general surgery.  She  tolerated advancement in her diet and was allowed discharge to home on  the 15th Dr. Loreta Ave with instructions to follow up with Dr. Jarold Motto in  the office in 1-2 weeks, also to follow up Dr. Everardo All in 1-2 weeks.   MEDICATIONS ON DISCHARGE:  1. Dilaudid 4 mg p.o. q.8-12h. p.r.n.  2. Protonix 40 mg daily.  3. Cozaar one p.o. daily.  4. Potassium supplement 20 mEq t.i.d.  5. Lovastatin 20 mg daily.  6. Lasix 20 mg daily.  7. PTU 50 mg daily.  8. Trazodone 75 mg h.s.  9. Toprol XL 25 mg daily.  10.Premarin 0.625 mg daily.      Amy Esterwood, PA-C      Robert D. Arlyce Dice, MD,FACG  Electronically Signed    AE/MEDQ  D:  11/30/2006  T:  11/30/2006  Job:  [id number]

## 2010-10-17 NOTE — Discharge Summary (Signed)
[facility]  Patient:    [name], [name] Visit Number: [id number] MRN: [id number]          Service Type: MED Location: [phone] Attending Physician:  Justine Null Dictated by:   Gordy Savers, M.D. [facility] Admit Date:  06/07/2001 Discharge Date: 06/15/2001                             Discharge Summary  FINAL DIAGNOSIS:  Partial small bowel obstruction, resolved.  ADDITIONAL DIAGNOSES: 1. Type 2 diabetes. 2. Hypertension. 3. Hyperthyroidism. 4. Osteoarthritis. 5. Gastroesophageal reflux disease. 6. Dyslipidemia.  DISCHARGE MEDICATIONS:  1. Lopid 600 mg b.i.d.  2. Potassium chloride 10 mEq q.d.  3. Norvasc 10 mg q.d.  4. Glucophage XR 1 g q.d.  5. Premarin 0.9 mg q.d.  6. PTU 50 mg three times a week.  7. Atenolol 25 mg q.d.  8. Aciphex 20 mg q.d.  9. Cozaar 100 mg q.d. 10. Trazodone 50 mg q.h.s. 11. Lasix 40 mg q.d.  HISTORY OF PRESENT ILLNESS:  The patient is a 65 year old black female who presented with a one day history of moderately severe abdominal pain, it was quite diffuse, associated with intractable nausea and vomiting.  The patient was admitted with suspected partial small bowel obstruction related to adhesions.  LABORATORY DATA AND HOSPITAL COURSE:  The patient was admitted to the hospital, initially supported with IV fluids and nasogastric tube placed. During the hospital course the patient was followed in consultation by the GI service and general surgery.  With bowel decompression, the patients clinical status slowly resolved.  Her Glucophage was held, and the patient was covered with a sliding scale regimen of Humalog.  Maintained nice glycemic control. Laboratory studies included an urinalysis that was negative, initial chemistries were largely unremarkable with a normal BUN and creatinine.  She had slight hyponatremia with a serum sodium of 134 on admission.  TSH was low therapeutic at 0.891.   Chemistries were unremarkable.  White count was normal. Radiographically, the patient had serial KUBs, as well as a CT of the abdomen and pelvis with contrast.  This revealed mild small bowel dilatation, but no evidence of free fluid, abscess, free air, or adenopathy.  Prior to her discharge, a small-bowel follow-through was obtained that revealed no evidence of obstruction.  EKG was normal.  The hospital course was marked by steady improvement.  Her nasogastric tube was discontinued.  Her diet advanced with clear liquids, and at the time of discharge she was tolerating a regular diet without difficulty.  She was treated with prophylactic subcutaneous heparin, and maintained on her preadmission medications after the NG tube was discontinued.  At the time of discharge she was stable.  DISPOSITION:  She will be discharged today on her preadmission medications.  FOLLOWUP:  She will return to her primary care physician within the next week for followup.  CONDITION ON DISCHARGE:  Improved. Dictated by:   Gordy Savers, M.D. [facility] Attending Physician:  Justine Null DD:  06/15/01 TD:  06/15/01 Job: [id number]

## 2010-10-17 NOTE — Assessment & Plan Note (Signed)
[facility]                            CARDIOLOGY OFFICE NOTE   [name], [name]                  MRN:          [id number]  DATE:09/29/2006                            DOB:          1946-02-08    A 65 year old African-American female with prior history of tachy  palpitations, atypical chest pain.  Seen by Ward Givens three weeks ago.  Apparently, I saw her a few years ago, but we do not have these records  available.  She has a history of hyperthyroidism, on PTU, followed by  Dr. Everardo All.  GERD, hypertension, short bowel syndrome, hyperlipidemia,  IV contrast allergy, chronic anxiety, depression, an EF of 55-65%.  No  evidence of ischemia on an Adenosine Myoview in December, 2006.  The  medications as outlined on the report of September 07, 2006.  At that time,  symptoms thought to be somewhat atypical.  Stress Myoview was suggested  but had not been done.   Lab work revealed a hemoglobin of 11.6, hematocrit 34.3.  Renal profile  was normal.  Hemoglobin A1C was 5.2.  Triglycerides 193, LDL 51, TSH  0.45 and 0.38.   The patient had been started on Toprol XL 25.  She has noted some  decrease in palpitations.  She has rare chest discomfort.  Patient  states that she has not had the Myoview because her 58 year old son with  esophageal is now in hospice, and this is taking most of her time.   PHYSICAL EXAMINATION:  VITAL SIGNS:  Blood pressure is 122/90, pulse 83,  normal sinus rhythm.  GENERAL APPEARANCE:  normal.  NECK:  JVP is not elevated.  Carotid pulses are palpable and equal  without bruits.  LUNGS:  Clear.  CARDIAC:  No murmur or gallop.  EXTREMITIES:  No edema.   I have suggested the patient continue on the same therapy.  Diagnoses as  above.  I have also recommended stress Myoview as soon as this is  possible.     Cecil Cranker, MD, [facility]  Electronically Signed    EJL/MedQ  DD: 09/29/2006  DT: 09/30/2006  Job #: [id number]

## 2010-10-17 NOTE — Discharge Summary (Signed)
[name], [name]                     ACCOUNT NO.:  [id number]   MEDICAL RECORD NO.:  [id number]                   PATIENT TYPE:  INP   LOCATION:  5025                                 FACILITY:  [facility]   PHYSICIAN:  Cornell Barman, PA [facility]              DATE OF BIRTH:  1945/09/20   DATE OF ADMISSION:  03/10/2002  DATE OF DISCHARGE:  03/24/2002                                 DISCHARGE SUMMARY   DISCHARGE DIAGNOSES:  1. Diarrhea.  2. Hypokalemia.  3. Rhabdomyolysis.  4. Lower extremity edema.  5. Hyperthyroidism.  6. Weakness.  7. Malnutrition.   BRIEF ADMISSION HISTORY:  The patient is a 65 year old African-American  female who presents with a longstanding history of chronic abdominal  pain  and a history of intermittent bowel obstructions status post multiple  abdominal  surgeries and small bowel resections.  She most recently had  small bowel resection 6/03.  At that time, she had removal of small portion  of her large and small intestine.  At that time, she also had adhesions  lysed.  She was discharged  a month later and then readmitted one week after  discharge for wound infection.  Since that time, the patient  has had  problems with intermittent diarrhea, nausea, vomiting and anorexia.  She did  see her gastroenterologist at __________ in 9/03 and was instructed to  increase her Imodium AD for diarrhea.   PAST MEDICAL HISTORY:  1. Chronic abdominal  pain with a history of intermittent small bowel     obstruction and adhesions status post small and large intestine     resections.  2. Gastroesophageal reflux disease.  3. Diverticulosis.  4. Irritable bowel syndrome.  5. Hypothyroidism started on PTU two weeks prior to this admission.  6. Adult onset diabetes mellitus.  Currently diet controlled.  7. Hypertension.  8. Obesity.  9. Depression.  10.      Hyperlipidemia.  11.      Degenerative joint disease.  12.      Anemia.  13.      Status post  cholecystectomy.  14.      Status post total abdominal  hysterectomy with bilateral salpingo-     oophorectomy secondary to fibroid tumors.  15.      Status post appendectomy.   HOSPITAL COURSE:  1. GI.  The patient presents with chronic abdominal  pain as well as     diarrhea.  We did ask for a GI consult.  Dr. Juanda Chance assisted [place] in the     evaluation and management of this patient.  There was concern for     malabsorption syndrome, small bowel fistula and or bypass.  She made     recommendations to proceed with a small bowel follow through as well as     malabsorption studies.  She did review the patient's endoscopy from 9/02     that revealed chronic gastritis, gastroesophageal  reflux disease and     hiatal hernia.  The colonoscopy in 9/02 revealed diverticulitis with an     internal hemorrhoid.   1. _________ revealed that the patient  underwent exploratory lap with three     hour surgery to lyse adhesions and resulted in small bowel resection     leaving 150 cm of small bowel.  This was anastomosed to the cecum.  The     patient  was empirically started on Flagyl to rule out any bacterial     overgrowth.  Stool studies were all negative.  Small bowel follow through     was performed and transient time in small bowel was within normal limits     without evidence of obstruction.  Also ordered were some studies to rule     out malabsorption.  This included D-xylose test as well as keratin.     These studies were reportedly negative. There is no definite evidence of     short gut malabsorption syndrome, but there is a thought that she may     have some bowel malabsorption and the hope was that this would improve     with the addition of Questran.  The patient had a decreased prealbumin     and therefore we placed a PICC line for TNA.  Prealbumin improved and the     patient  was tapered off TNA and she started to tolerate a regular diet.     At this time we are continuing to titrate  the Questran as needed.  She     had been on Imodium and we have changed this to Lomotil in attempts to     improve the patient's diarrhea.  The patient  had completed a full course     of Flagyl as well.  The exact etiology of her diarrhea has not been     determined.  However, it is thought that perhaps she is malabsorbing and     the hope is that the Questran and Lomotil will help.   1. Hypokalemia.  Suspect this is secondary to her diarrhea and she also was     noted to have a low magnesium level.  Unfortunately, mag ox may     exacerbate the patient's diarrhea so this had to be discontinued.  We     have successfully replaced the patient's potassium.  She is on Lasix and     will have her continue this at discharge.   1. History of Grave's disease.  PFTs were normal.  Her PTE was held on     admission and will defer resuming this to her primary care physician.   1. Adult onset diabetes mellitus.  The patient's blood sugars remained     stable in this hospitalization.  Her  Glucophage was discontinued by her     primary care physician and she has not needed any during this     hospitalization.   1. Rhabdo.  The patient presented with significantly elevated CK on     admission was over 5000 and rose to over 7000.  The patient was     maintained on IV fluids with no renal dysfunction.  The patient's total     CK did normalize.   1. Hypertension.  Her Cozaar and Norvasc were held on admission.  These have     been resumed.   LABORATORIES AT DISCHARGE:  Potassium was 4.4, magnesium was 1.7, BUN 4,  creatinine 0.6,  ferratin 469, total iron 62, TIBC low at 174, percent  saturation was 30.  Hemoglobin was 7.8.  Repeat hemoglobin is still pending.  Stool for blood was heme positive on 03/15/02.  Keratin level is low at 26.  TSH was 0.590. Free T4 is 0.61.  Free T3 was 2.5.  Prealbumin was 21.1. Stool for white blood cells was negative.  Stool for C. diff was negative.  Small bowel  follow through was normal.   MEDICATIONS AT DISCHARGE:  1. Nexium 40 mg q.d.  2. Trazodone 50 mg q.h.s.  3. Lasix 40 mg q.d.  4. Potassium chloride 20 mEq q.d.  5. Questran 4 g t.i.d.  6. Lomotil one tablet t.i.d.  7. Darvocet-N 100 q.4-6h p.r.n.  8. Cozaar 50 mg q.d.  9. Norvasc 5 mg q.d.  10.      Premarin 0.3 mg q.d.   Followup with Dr. Everardo All 11/7 at 11 a.m. and Dr. Jarold Motto Tuesday 11/18 at  3:15 p.m.                                               Cornell Barman, PA [facility]    LC/MEDQ  D:  03/23/2002  T:  03/24/2002  Job:  [id number]   cc:   Gregary Signs A. Everardo All, M.D. [facility]   [name]. Jarold Motto, M.D. [facility]

## 2010-10-17 NOTE — Consult Note (Signed)
[name], [name] NO.:  [id number]   MEDICAL RECORD NO.:  [id number]                   PATIENT TYPE:  INP   LOCATION:  0350                                 FACILITY:  [facility]   PHYSICIAN:  Iva Boop, M.D. [facility]           DATE OF BIRTH:  08-Apr-1946   DATE OF CONSULTATION:  DATE OF DISCHARGE:                                   CONSULTATION   REQUESTING PHYSICIAN:  [facility] hospital service.   PRIMARY CARE PHYSICIAN:  Dr. [name].   GASTROENTEROLOGIST:  Dr. Sheryn Bison.   REASON FOR CONSULTATION:  Elevated CEA and presumed diverticular abscess.   HISTORY:  This is a pleasant 65 year old African-American woman who have a  history of multiple hospital admissions for diverticulitis and partial small-  bowel obstructions.  She has had several resections for lysis of adhesions  and has had on admission in March 2004 (her last) with inflammatory changes  of the cecum and terminal ileum of unclear etiology.  She had done very well  until lately.  She developed some increasing abdominal pain.  She was  actually seen by Dr. Jarold Motto in the office on May 15, 2003, with some  worsening abdominal pain which has been a chronic recurrent problem and some  chronic diarrhea symptoms.  He ordered some laboratory tests.  He had  performed a colonoscopy in March of 2004, which showed scattered  diverticulosis, otherwise normal exam.  He had an esophageal stricture with  a prominent vein and hiatal hernia.  A Maloney dilator was used to dilate  her stricture at the time.  She was admitted this time with worsening  abdominal pain.  CT scanning initially raised the question of a mass in the  colon with lymphadenopathy.  She had an elevated CEA of 16 with a normal  CA19-9 and CA125.  Subsequently, after she was premedicated because of her  CONTRAST allergy, she received IV contrast and a repeat CT abdomen and  pelvis on June 04, 2003.  It  was thought that changes in the sigmoid colon  show amorphous soft tissue anterior to the midpelvis and an elliptical fluid  collection with small amount of extraluminal air in the anterior midline  just anterior to the sigmoid colon around some diverticula.  It was thought  this represented diverticular abscess.  The ureters were slightly dilated  without a definite point of obstruction.  She actually had felt better  yesterday but is having some increasing abdominal pain today, rather  diffuse.  She says it feels like something is loose in there.  She has had  low-grade fevers to 100 prior to admission.  She had nausea and vomiting  prior to admission.  Today she seems to think her urine has become darker.  A urinalysis has been sent.   MEDICATIONS:  IV Cipro and Flagyl.  Otherwise on Aciphex, Cozaar, trazodone,  folic acid, Premarin, spironolactone, Lasix, and Slow-Mag.  DRUG ALLERGIES:  IV CONTRAST, CODEINE, MORPHINE, and SULFA.  She apparently  has intolerances of other medications.   PAST MEDICAL HISTORY:  1. Cholecystectomy.  2. Hypoparathyroidism.  3. Adult-onset diabetes mellitus.  4. Obesity.  5. Depression.  6. Dyslipidemia.  7. Degenerative arthritis.  8. Gastroesophageal reflux disease.  9. Hysterectomy.  10.      Bilateral oophorectomy.  11.      Appendectomy.   FAMILY HISTORY:  Noncontributory.  No GI problems.   SOCIAL HISTORY:  Married.  No alcohol or tobacco.   REVIEW OF SYSTEMS:  She does have a history of some kidney stones seen on  previous CTs.  She does not describe other urinary difficulty at this time.  She has leg and hand pain with arthritis.  She takes trazodone to help her  sleep.  She has chronic insomnia.  All other systems appear negative.   PHYSICAL EXAM:  GENERAL:  Pleasant, obese black woman in no acute distress  at this time.  VITAL SIGNS:  Temperature 97.4, blood pressure 100/70, pulse 88.  HEENT:  Eyes anicteric.  Mouth free of  lesions.  NECK:  Supple.  No mass.  CHEST:  Clear.  HEART:  S1, S2.  No rubs or gallops.  ABDOMEN:  Soft.  There are multiple surgical scars.  It is quiet.  She is  rather diffusely tender, moreso in the right lower quadrant and suprapubic  area, with some guarding.  The abdomen is clearly soft, and there are no  worrisome peritoneal findings.  RECTAL:  Not performed.  EXTREMITIES:  No edema.  SKIN:  Without rash.  NEUROLOGIC:  Alert and oriented x3.   LABORATORY DATA:  Laboratories from yesterday:  White count 6.2, hemoglobin  9.3.  Sedimentation rate 54.  Potassium 3.3.  BMET yesterday shows glucose  139.  Hemoglobin was 11.1 on admission.  Alkaline phosphatase 153, amylase  144, normal lipase.  Urinalysis on June 03, 2003, showed trace hemoglobin,  small bilirubin.   ASSESSMENT:  She appears to have diverticulitis with an abscess.  The mildly  elevated CEA is of unclear significance.  She had a colonoscopy without  evidence for malignancy in March of last year, approximately 9-10 months  ago.  I do not think she has colon cancer.   RECOMMENDATIONS AND PLAN:  At this point I would continue antibiotics.  If  she continues to have pain, would suggest a surgery consult.  Though she is  not a good surgical candidate, they may be able to provide some  input into her management.  May need repeat imaging with CT scanning if she  fails to improve as well, though would not rush into that.  Further plans  depending on clinical course.   I appreciate the opportunity to care for this patient.                                               Iva Boop, M.D. [facility]    CEG/MEDQ  D:  06/06/2003  T:  06/07/2003  Job:  [id number]   cc:   Gregary Signs A. Everardo All, M.D. [facility]   [name]. Jarold Motto, M.D. [facility]

## 2010-10-17 NOTE — H&P (Signed)
[facility]  Patient:    [name], [name] Visit Number: [id number] MRN: [id number]          Service Type: MED Location: 1E 0107 01 Attending Physician:  Ilene Qua Dictated by:   Corwin Levins, M.D. [facility] Admit Date:  07/29/2001                           History and Physical  CHIEF COMPLAINT:  The patient is a 65 year old black female here with sudden increased acute on chronic left upper quadrant pain and nausea and vomiting similar to an admission in the year 2000 when she was admitted and treated for diverticulitis.  There is no fever or chills but she has had nausea and vomiting.  She is unable to keep her p.o. medications down and feels acutely ill.  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  She had an appointment and a referral to [facility] GI, August 01, 2001.  PAST MEDICAL HISTORY:  1. Chronic intermittent small-bowel obstruction.  2. Gastroesophageal reflux disease.  3. Chronic constipation.  4. Diverticulosis.  5. Hyperthyroidism.  6. Diabetes mellitus.  7. Morbid obesity.  8. Hypertension.  9. Depression/anxiety. 10. High cholesterol 11. Degenerative joint disease.  ALLERGIES:  CODEINE, MORPHINE, ASPIRIN, PENICILLIN, TAGAMET, SULFA, IVP DYE.  MEDICATIONS:  1. Cozaar 100 mg b.i.d.  2. Lasix 40 mg p.o. q. day.  3. Aciphex 20 mg b.i.d.  4. Premarin 0.9 mg q. day.  5. Glucophage XR 1000 q. day.  6. Trazodone 50 q.h.s.  7. Lopid 600 mg b.i.d.  8. Tenormin 25 q.d.  9. Norvasc 5 mg q. day. 10. miralax b.i.d. 11. K-Dur 20 mEq p.o. q.d. 12. Atarax and Lopid on a p.r.n. basis.  SOCIAL HISTORY:  No tobacco, no alcohol.  FAMILY HISTORY:  Noncontributory.  No GI problems.  REVIEW OF SYSTEMS:  Otherwise noncontributory.  PHYSICAL EXAMINATION:  GENERAL: The patient is a 65 year old black female appearing acutely ill but nontoxic.  VITAL SIGNS:  Borderline hypertension 90/60,  respirations 20, pulse 80, temperature 98.0, weight 232.  HEENT:  Sclerae clear, TMs clear.  Pharynx benign.  NECK:  Without lymphadenopathy, JVD, thyromegaly.  CHEST:  No rales or wheezing.  CARDIAC:  Regular rate and rhythm no murmur.  ABDOMEN:  Soft with marked left upper quadrant tenderness, decreased bowel sounds with guarding, no rebound.  EXTREMITIES:  No edema.  NEUROLOGIC:  Cranial nerves II-XII intact.  Otherwise nonfocal.  LABORATORY DATA:  None done prior to visit.  ASSESSMENT AND PLAN:  1. Abdominal pain, acute on chronic.  Question diverticulitis versus     small-bowel obstruction versus other.  She will be admitted and made     n.p.o. and given IV fluids, antiemetics, pain control measures.  Will     check routine labs coagulation studies, amylase, lipase, abdominal pelvic     CT with contrast, blood and urine cultures, empiric antibiotics IV, GI consultation.  She needs a PICC line due to poor IV access.  2. Hyperthyroidism.  Previously on PTU. Will check TSH and free T4.  3. Diabetes mellitus.  Check CBGs.  Hold glucophage for now.  4. Hypertension.  Hold home medications due to borderline hypotension and     unclear clinical picture. Dictated by:   Corwin Levins, M.D. [facility] Attending Physician:  Ilene Qua DD:  07/29/01 TD:  07/29/01 Job: [id number]

## 2010-10-17 NOTE — Discharge Summary (Signed)
[name], [name]                     ACCOUNT NO.:  [id number]   MEDICAL RECORD NO.:  [id number]                   PATIENT TYPE:  INP   LOCATION:  0350                                 FACILITY:  [facility]   PHYSICIAN:  Rene Paci, M.D. [facility]          DATE OF BIRTH:  Apr 28, 1946   DATE OF ADMISSION:  06/03/2003  DATE OF DISCHARGE:  06/13/2003                                 DISCHARGE SUMMARY   DISCHARGE DIAGNOSES:  1. Acute-on-chronic abdominal pain secondary to early diverticular abscess,     improved on oral antibiotic therapy for another two weeks.  2. Mild bilateral hydronephrosis secondary to bladder distention, status     post urology evaluation.  Recommend double void q.4h. and follow up     outpatient in two weeks with Dr. Annabell Howells.  3. Hypokalemia, resolved.  4. Chronic diarrhea, irritable bowel syndrome.  5. Diet-controlled type 2 diabetes.  6. History of hypertension.  7. History of hypercholesterolemia.  8. History of anxiety and depression.   DISCHARGE MEDICATIONS:  1. Cipro 500 mg p.o. b.i.d. x2 additional weeks.  2. Flagyl 500 mg t.i.d. x2 additional weeks.  3. Ultram 1 p.o. q.6h. p.r.n. pain.  4. Phenergan 25 mg p.o. q.6h. p.r.n. pain.   Other medications are as prior to admission and include trazodone 100 mg  p.o. q.h.s., Slow-Mag 64 mg p.o. q.h.s., Protonix 40 mg p.o. b.i.d.,  Premarin 0.625 mg p.o. q.d., folic acid 1 mg p.o. q.d., Lasix 40 mg p.o.  q.d., Cozaar 50 mg p.o. q.d., potassium chloride 10 mEq p.o. q.d.,  spironolactone 100 mg p.o. q.d.   DISPOSITION:  Patient is discharged to home, tolerating p.o. without  significant difficulty.  Hospital followup is scheduled with GI, Dr.  Jarold Motto, for Tuesday, January 25th at 10:00 a.m.  She is also to contact  urology, Dr. Annabell Howells, at his office in 1-2 weeks for followup.  She is also to  contact her primary care physician, Dr. [name], in the next three  weeks for hospital followup as needed.   CONDITION ON DISCHARGE:  Medically stable.   HOSPITAL COURSE:  Problem #1:  Acute-on-chronic abdominal pain, early  diverticular abscess.  Patient is a pleasant 65 year old woman with a  history of chronic diverticular disease and IBS, who presented to the  emergency room on the day of admission complaining of increased abdominal  pain associated with anorexia for two days.  A noncontrast CT done in the  emergency room showed questionable mass associated with a small bowel near  the anterior abdominal wall, and she was thus admitted for further  evaluation.  Once treated for prophylaxis for IV contrast allergy, a CT with  contrast was obtained, which was consistent with early diverticular abscess  changes.  Although she was afebrile and had a normal white count, she was  begun on IV antibiotics for treatment of this disease and made n.p.o. for  several days.  Her pain was slow to resolve,  but eventually, she was  tolerating p.o.  Antibiotics were changed to oral Cipro and Flagyl.  She was  seen in consultation by GI, who concurred with this treatment.  She is to  continue antibiotics as listed above and follow up with GI as scheduled.  Her pain has been well controlled initially with Demerol and then changed  with Ultram, which she preferred to Vicodin and Percocet due to decreased  stomach irritation.  Problem #2:  Mild bilateral hydronephrosis:  Incidentally, on CT scan done  for the initial diagnosis of abscess as well as followup on January 8th,  which showed resolution of abscess, the patient had persistent bilateral  hydronephrosis, which was a new finding.  Urology was consulted, who  performed an ultrasound on starting with Foley, which showed resolution of  this hydronephrosis.  There was no evidence of obstruction, and diagnosis  was made of bilateral hydro secondary to bladder distention.  Recommended  treatment of scheduled q.4h. voiding by patient is to be undertaken.   Hospital followup as an outpatient with Dr. Annabell Howells as described above.   Her other medical issues remained stable during this hospitalization.  No  other changes were made in her medical regimen.                                               Rene Paci, M.D. [facility]    VL/MEDQ  D:  06/13/2003  T:  06/13/2003  Job:  [id number]

## 2010-10-17 NOTE — H&P (Signed)
[facility]  Patient:    [name], [name] Visit Number: [id number] MRN: [id number]          Service Type: MED Location: 3W 0378 01 Attending Physician:  Verdene Lennert Dictated by:   Lonzo Cloud. Kriste Basque, M.D. [facility] Admit Date:  06/25/2001   CC:         Justine Null, M.D. [facility]   History and Physical  HISTORY OF PRESENT ILLNESS:  The patient is a 65 year old black female, patient of Dr. Everardo All, with history of recurrent small-bowel obstructions and diabetes.  She was recently admitted from January 8 to June 15, 2001, with a similar problem:  Abdominal pain and probable small-bowel obstruction.  She states that the pain during the last admission never went totally away but was better with oral Darvocet at discharge.  She was followed up in the office by Dr. Everardo All last week.  On the evening of admission her pain increased, became very severe, and she knew that she could not make it at home and, therefore, came to the emergency room for evaluation.  Was seen by Dr. Adriana Simas and felt to have probable recurrent partial small-bowel obstruction with severe pain from adhesions, and given IV Demerol.  She was referred for admission for further evaluation and management of this problem.  Review of the old records indicates diffuse abdominal pain of uncertain etiology during the last admission.  She was seen by gastroenterology (Dr. Melvia Heaps) and from general surgery (Dr. Claud Kelp).  Workup included a CT of the abdomen which showed some small-bowel dilatation and was otherwise negative.  She had serial KUBs and a small-bowel follow-through which showed some fixed loops from adhesions, but no obstruction.  She was noted to have huge NG output during that admission.  Available history indicates that she had gallbladder surgery in the 1990s.  She had a remote appendectomy.  She had a history of a total abdominal hysterectomy and  right salpingo-oophorectomy in the 1970s.  She has had five previous laparotomies for small-bowel obstructions, and several admissions for obstructions, treated medically.  Apparently had surgery at [facility] in the past.  She had an attempted laparotomy several years ago by Dr. Luan Pulling, but this procedure was aborted secondary to a "concrete abdomen."  Additionally, she has known diverticulosis and gastroesophageal reflux disease, on Aciphex.  She notes some nausea but no vomiting today.  She had a bowel movement several days ago.  PAST MEDICAL HISTORY AND MEDICATIONS: 1. History of diabetes, controlled on Glucophage XR 1000 mg p.o. q.d. 2. History of hypertension, on several medications including Norvasc 10 mg    p.o. q.d., atenolol 25 mg p.o. q.d., Cozaar 100 mg p.o. q.d., Lasix 40 mg    p.o. q.d., and K+ 10 1 tablet daily. 3. History of hyperlipidemia, controlled on Lopid 600 mg p.o. b.i.d. 4. History of hyperthyroidism, followed by Dr. Everardo All, on PTU 50 mg p.o.    three times a week at present. 5. History of degenerative arthritis. 6. Anxiety, for which she takes trazodone 50 mg p.o. q.h.s. 7. Only other medication is Premarin 0.9 mg p.o. q.d.  SOCIAL HISTORY:  The patient does not smoke and does not drink alcohol.  She is married.  FAMILY HISTORY:  Noncontributory.  Please see old records.  PHYSICAL EXAMINATION:  GENERAL:  Obese 65 year old black female in mild distress secondary to abdominal pain.  VITAL SIGNS:  Blood pressure 138/90, pulse 80 per minute and regular, respirations 26 per minute and  slightly shallow, temperature 97.4 orally.  HEENT:  Dry mucous membranes.  No lesions seen.  NECK:  Mild goiter.  No lymphadenopathy, no jugular venous distention, no carotid bruits.  CHEST:  Clear to percussion and auscultation.  CARDIAC:  Regular rhythm.  Grade 1/6 systolic ejection murmur left sternal border.  No rubs or gallops heard.  ABDOMEN:  Obese.  Slightly  distended.  Tender diffusely.  The tenderness is worse on the left towards the epigastrium, and less on the right.  There is no rebound.  Bowel sounds are diminished.  Could not appreciate any organomegaly.  EXTREMITIES:  Some venous insufficiency.  No cyanosis, clubbing, or edema.  NEUROLOGIC:  Intact, without focal abnormalities detected.  LABORATORY DATA:  Pending in the emergency room at the time of this dictation.  IMPRESSION:  Patient with diffuse abdominal pain that really has not resolved from several weeks ago.  Extensive workup at that time seemed to indicate adhesions and a partial small-bowel obstruction.  PLAN:  She is being admitted for further evaluation and treatment.  We will place an NG for suction.  Give her IV Protonix and IV pain medications.  Will place her on subcutaneous heparin prophylactically.  Please see admission orders. Dictated by:   Lonzo Cloud. Kriste Basque, M.D. [facility] Attending Physician:  Verdene Lennert DD:  06/26/01 TD:  06/26/01 Job: [id number]

## 2010-10-17 NOTE — Assessment & Plan Note (Signed)
[facility]                            CARDIOLOGY OFFICE NOTE   JENEE, SPAUGH                  MRN:          [id number]  DATE:09/07/2006                            DOB:          22-Jul-1945    PATIENT PROFILE:  A 65 year old African-American female with a prior  history of tachy palpitations and atypical chest pain who presents with  recurrent mid scapular and left arm discomfort.   PROBLEM LIST:  1. History of atypical chest pain.      a.     On August 07, 2003, exercise Myoview with EF of 55%, wall       motion abnormalities and probable trivial ischemia towards the       apex, felt to be a low risk study.      b.     On May 18, 2005, Adenosine Myoview, EF 55%.  No       evidence of ischemia.  2. History of tachycardic palpitations.      a.     On May 18, 2005, a 2D echocardiogram, EF 55-65%.       Moderate aortic root dilatation.  3. Obesity.  4. Hyperthyroidism, on PTU, followed by Dr. Everardo All.  5. Gastroesophageal reflux disease.  6. Hypertension.  7. History of abnormal liver function tests.  8. History of short bowel syndrome.  9. Diverticulosis.  10.History of surgery for lysis of abdominal adhesions.  11.Hyperlipidemia.  12.IV CONTRAST allergy.  13.Chronic anxiety and depression.   HISTORY OF PRESENT ILLNESS:  A 65 year old African-American female with  the above problem list, who was in her usual state of health until about  2 or 3 weeks ago when she began to experience occasional exertional 5-  8/10 mid scapular discomfort, possibly associated with mild shortness of  breath or nausea, she is not really sure, lasting about five minutes and  resolving with rest.  She has also been having left biceps to left  forearm aching sensation that could occur with rest or exertion, that  may or may not be associated with shortness of breath, also lasting 4-5  minutes, resolving spontaneously.  She denies being able to  reproduce  this discomfort with palpation with positioning.  She also has a history  of tachy palpitations, as stated above, but thinks that maybe this has  been more frequent over the past couple of weeks, although she cannot  really quantify how often the tachy palpitations occur or how long they  last.  She says she has been under a lot of stress as a result of her  son who has been staying with her and who has cancer.  She denies any  PND, orthopnea, dizziness, syncope, edema, or early satiety.   ALLERGIES:  PENICILLIN, AUGMENTIN, TAGAMET, MORPHINE, SULFA, IV DYE, and  FLAGYL.   HOME MEDICATIONS:  1. AcipHex 20 mg b.i.d.  2. Cozaar 50 mg daily.  3. Folic acid 1 mg daily.  4. Potassium chloride 20 mEq t.i.d.  5. Calcium 60 mg daily.  6. Aldactone 100 mg b.i.d.  7. Lovastatin 20 mg daily.  8. Furosemide 20 mg  daily.  9. Premarin 0.625 mg daily.  10.B12 1000 mg weekly.  11.PTU 50 mg daily.  12.Trazodone 150 mg nightly.   PHYSICAL EXAMINATION:  VITAL SIGNS:  Blood pressure 140/115, 130/90 on  repeat.  Heart rate 59.  Respirations 16.  She is afebrile.  Her weight  is 210 pounds.  GENERAL:  A pleasant African-American female in no acute distress.  Awake, alert and oriented x3.  NECK:  No bruits or JVD.  LUNGS:  Respirations are regular and unlabored.  Clear to auscultation.  CARDIAC:  Regular S1 and S2.  No S3, S4, or murmurs.  ABDOMEN:  Round, soft, nontender, nondistended.  Bowel sounds are  present x4.  EXTREMITIES:  Warm, dry, and pink.  No clubbing, cyanosis or edema.  Dorsalis pedis and posterior tibial pulses are 1+ and equal bilaterally.   ACCESSORY CLINICAL FINDINGS:  EKG shows sinus arrhythmia, no acute ST/T  changes.   ASSESSMENT/PLAN:  1. Mid scapular and left arm discomfort:  It is difficult to sort out      based on the patient's story, how frequently these symptoms occur      and what they may or may not be associated with.  Notably, I was      able to  reproduce her left biceps and forearm discomfort by      palpation.  I do not believe that this is cardiac in origin.  More      concerning, however, is the scapular discomfort that she has been      having intermittently over the past 2 or 3 weeks.  Again, she      describes the discomfort as more sharp in nature, potentially      associated with shortness of breath or nausea, but she seems to      have some difficulty putting a finger on it, saying that she really      has not been paying much attention to it.  It is therefore      difficult to determine the significance.  Given that she has had      two negative Myoviews in the past, the last in December, 2006, I      gave her the option of cardiac catheterization, which she would      prefer to avoid unless it was felt to be absolutely necessary.  In      light of this, it is somewhat atypical, although concerning      presentation, we have gone ahead and scheduled an exercise Myoview      to rule out ischemia.  If this is abnormal or if she continues to      have exertional symptoms we would have a very low threshold for      pursuing cardiac catheterization at that point, and she was      agreeable to this.  2. Tachycardic palpitations:  She feels that these may have increased      in frequency, and she has been under a lot of stress  recently.      She says it has been a while since she has had thyroid function      tests.  We will check the PFTs today and also will add Toprol XL 25      mg daily.  Could consider Holter monitoring to determine the      frequency and significance as well as actual rhythm when she is      having tachycardic palpitations.  She had a normal echo in      December, 2006.  3. Hypertension:  Blood pressure is elevated here in the office with      elevated diastolic pressures.  I have added Toprol 25, as above.      She is advised to follow at home. 4. Hyperlipidemia:  She is on Lovastatin therapy.  5.  Hyperthyroidism:  She is followed by Dr. Everardo All.  She is on PTU.      We will check TFTs today, given the increased frequency of her      tachycardic palpitations.   DISPOSITION:  Patient will undergo a stress Myoview as well as lab work,  as above.  Follow up with Dr. Gabriel Rung in approximately two weeks.      Nicolasa Ducking, ANP  Electronically Signed      Rollene Rotunda, MD, [facility]  Electronically Signed   CB/MedQ  DD: 09/07/2006  DT: 09/08/2006  Job #: [id number]

## 2010-10-17 NOTE — Discharge Summary (Signed)
[place]  Patient:    [name], [name] Visit Number: [id number] MRN: [id number]          Service Type: MED Location: [id number] Attending Physician:  Corwin Levins Dictated by:   Valetta Mole Swords, M.D. [facility] Proc. Date: 08/02/01 Admit Date:  07/29/2001 Disc. Date: 08/02/01   CC:         Justine Null, M.D. [facility]   Discharge Summary  DISCHARGE DIAGNOSES:  1. Chronic abdominal pain.  2. History of intermittent small-bowel obstruction.  3. Gastroesophageal reflux disease.  4. Diverticulosis.  5. Chronic constipation.  6. Hyperthyroidism.  7. Diabetes mellitus.  8. Hypertension.  9. Obesity. 10. Depression. 11. Hyperlipidemia. 12. Degenerative joint disease. 13. Anemia.  DISCHARGE MEDICATIONS:  1. Cozaar 100 mg p.o. q.d.  2. Lasix 40 mg p.o. q.d.  3. Aciphex 20 mg p.o. q.d.  4. Premarin 0.9 mg p.o. q.d.  5. Glucophage 1000 mg p.o. b.i.d.  6. Trazodone 50 mg p.o. q.h.s.  7. Tenormin 25 mg p.o. q.d.  8. Norvasc 5 mg p.o. q.d.  9. MiraLax 17 g p.o. b.i.d. 10. K-Dur 20 mEq p.o. q.d.  DIETARY RESTRICTIONS: She is to maintain a diabetic died and a low-residue diet.  FOLLOWUP PLANS: Dr. Everardo All in one to two weeks. She also has a followup with gastroenterology at [facility] for a second opinion regarding her chronic abdominal pain.  HOSPITAL PROCEDURES:  1. CT of the abdomen and pelvis, demonstrated dilated loops of ileum.     Question whether this is a focal ileus versus a partial small-bowel     obstruction, although contrast did enter the colon, which would argue     against obstruction. CT of the pelvis did not demonstrate any significant     abnormalities other than those seen on the abdominal CT.  2. A central PICC line was placed on July 30, 2001.  3. On June 30, 2001, abdominal film did not demonstrate any obstruction     or perforation.  HOSPITAL LABORATORY DATA: Urine culture grew 80,000 colonies thought  to be insignificant growth. Lipase was normal at 27. INR was normal at 0.9. Total T4 was normal at 7.5. TSH was slightly suppressed at 0.126. CMET on admission was normal. CBC on admission was significant for a hemoglobin of 10.5, platelet count 412,000.  CONDITION ON DISCHARGE: Improved, although still with abdominal pain.  HOSPITAL COURSE: #1 - ABDOMINAL PAIN: The patient was admitted to the hospital service on July 29, 2001, by Dr. Jonny Ruiz. See his admission history and physical for details. She was admitted with abdominal pain. She underwent the above studies and laboratory work. She was maintained on IV hydration and initially IV antibiotics, which were eventually discontinued. A PICC line was placed for poor IV access and used for hydration. The patient continued to have intermittent abdominal pain, which was treated with analgesics. At the time of discharge, she was on oral analgesics.  #2 - ENDOCRINOLOGY: Patient with a history of hypothyroidism. TSH and T4 as above. She will follow up with Dr. Everardo All.  #3 - DIABETES: While in the hospital, Glucophage was held because of IV contrast for CAT scan. CBGs in the hospital were fairly well-controlled at 112, 126, 120, 114, and 134. She was maintained on a diabetic diet. She will resume her Glucophage as an outpatient.  #4 - HEMATOLOGY/ANEMIA: This will be further evaluated as an outpatient.  CONSULTANTS: The patient was seen by gastroenterology, who did not feel  that any further evaluation was necessary. A great deal of time was spent with the patient discussing the difficult diagnosis of intermittent bowel obstruction, ileus, and chronic abdominal pain. Dictated by:   Valetta Mole Swords, M.D. [facility] Attending Physician:  Corwin Levins DD:  08/02/01 TD:  08/02/01 Job: [id number]

## 2010-10-17 NOTE — H&P (Signed)
[place]  Patient:    [name], [name]                    MRN: [id number] Adm. Date:  09/28/99 Attending:  Judie Petit T. Pleas Koch., M.D. [facility] Dictator:   Dianah Field, P.A.-C. CC:         Venita Lick. Pleas Koch., M.D. [facility]             Catalina Lunger, M.D.                         History and Physical  DATE OF BIRTH:  1946/01/27  CHIEF COMPLAINT:  Left upper quadrant abdominal pain with nausea and vomiting, beginning several hours prior to admission.  HISTORY OF PRESENT ILLNESS:  [name] is a 65 year old African-American female with a history of chronic abdominal pain.  She has dense adhesions owing to multiple prior abdominal and pelvic surgeries.  She has a history of diverticulosis and diverticulitis.  She was admitted in December 2000, with very much the same  clinical symptoms.  Chronic constipation has recently been controlled well with  fiber supplements.  On the day of admission in the late afternoon she developed  acute left upper quadrant pain which radiated into the left flank, and was associated with left costovertebral angle pain.  The pain progressed, and within several hours she was having nausea and vomiting, of which she had two episodes. There was no coffee-ground or heme material in the emesis.  The patient came to the emergency room for an evaluation.  An abdominal film there showed scattered air  fluid levels and a few areas with increase in small bowel gaseous distention. Gas was noted in the colon, and there was no free air.  She has multiple staples visible on x-ray from her surgeries.  Dr. Judie Petit T. Pleas Koch. elected to admit the patient for pain management, bowel rest, and a CT scan, for further evaluation as to the source of her pain.  In addition to the December 2000, admission, she was also admitted in August 2000, and was treated for what was diagnosed as diverticulitis, although the  CT scan f the abdomen and pelvis was not confirmatory of diverticulitis.  She has had chronic constipation but again this has been fairly well-controlled with daily fiber supplements.  In fact, she did have a bowel movement on September 27, 1999, the day of the onset of her symptoms.  The patient has had recurrent issues with pain management and accelerating use f narcotics in the hospital.  She has had a surgical evaluation by Dr. Catalina Lunger; however, because of the severe nature of very dense adhesions, she s not felt to be a good surgical candidate for lysis of adhesions.  Her most recent colonoscopy was in March 1998, which showed diverticulosis and hemorrhoids.  PAST MEDICAL/SURGICAL HISTORY:  1. Status post total abdominal hysterectomy.  2. Status post cholecystectomy.  3. Status post hemorrhoidectomy.  4. Status post exploratory laparotomy on three separate occasions.  5. Hypertension.  6. Depression.  7. Hyperthyroidism.  8. Chronic abdominal pain.  9. Diverticulosis. 10. Anemia. 11. Adult onset diabetes mellitus. 12. Gravida 3, para 3, all vaginal deliveries. 13. Status post removal of benign right breast lesion.  CURRENT MEDICATIONS:  1. Promethazine 25 mg p.o. q.6h. p.r.n.  2. Actos 15 mg p.o. q.d.  3. Aciphex 20 mg p.o. b.i.d.  4. Darvocet-N  100 one to two p.o. q.6h. p.r.n.  5. Cozaar 50 mg two p.o. q.d.  6. Propylthiouracil 50 mg p.o. q.d.  7. Lasix 40 mg p.o. q.d.  8. Premarin 0.9 mg p.o. q.d.  9. Allegra D one p.o. b.i.d. p.r.n. 10. Gemfibrozil 600 mg p.o. b.i.d.  ALLERGIES:  CODEINE, which causes itching.  MORPHINE SULFATE, which causes itching.  FAMILY HISTORY:  Her father and mother both had adult onset diabetes mellitus. Her father had a myocardial infarction and died secondary to a blood clot in his leg. Her sister has also had a heart attack.  SOCIAL HISTORY:  The patient is not currently working, although she does housework around  the house.  She is married and lives with her husband.  She has three children.  She does not smoke or consume alcoholic beverages.  REVIEW OF SYSTEMS:  Neurologic:  Recently had release of what looks to be ulnar  nerve per Dr. Artist Pais. [place].  This was an outpatient procedure within the  last two weeks.  Pulmonary:  No cough or shortness of breath.  Cardiovascular: No extremity edema, no palpitations, and no chest pain.  Genitourinary:  Denies urinary incontinence.  No dysuria, no hematuria.  GI:  As above.  The patient denies chronic reflux symptoms.  Denies dysphagia.  No recent blood per rectum.  Musculoskeletal:  Complains of some lower extremity pain in the knees and ankles. She no longer has any pain in the wrist.  PHYSICAL EXAMINATION:  VITAL SIGNS:  Blood pressure 155/98, pulse 84, respirations 26, temperature 97.1 degrees.  GENERAL:  The patient is an obese, uncomfortable-appearing, depressed African-American female, with a flat affect.  She is alert and oriented x 3.  HEENT:  Sclerae anicteric.  Conjunctivae pink.  EOMI.  Oropharynx moist and clear with dentition in good repair.  NECK:  Obese, without thyromegaly.  There is no jugular venous distention, no bruits, no masses.  CHEST:  Clear to auscultation bilaterally, with excellent breath sounds.  No adventitious sounds.  ABDOMEN:  Obese, soft, with hypoactive bowel sounds.  No masses, hepatosplenomegaly, or herniae; however, there is adherent-type scar tissue in he midline.  Tenderness is on the left side, both in the upper and the lower quadrants to deep palpation.  There is no rebound or guarding associated with this.  On her back there is some moderate left costovertebral angle tenderness.  There is also left flank tenderness.  RECTAL:  There are no masses.  Rectal tone is good.  She is heme-negative.  EXTREMITIES:  Without cyanosis, clubbing, or edema.  DERMATOLOGIC:  No rash, no lesions.  The  patient is scratching herself all over in the upper extremities, on the face, and trunk, in a distracted manner.   MUSCULOSKELETAL:  There is a nicely-healing scar at the right wrist and right elbow region.  GENITOURINARY:  Not performed.  BREASTS:  There is a fibrocystic-type feel to the breasts, but no masses appreciated.  LABORATORY DATA:  Hemoglobin 11.1, white blood cell count 7.9.  Amylase 93, lipase 32.  The comprehensive metabolic profile is within normal limits, with the exception of a glucose of 167.  CT scan of the abdomen is showing dilated distal loops of small bowel at the pelvis, associated with adhesions, probably a partial small bowel obstruction; however, the oral contrast has traversed into the colon.  IMPRESSION: 1. Recurrent left-sided abdominal pain, associated with left costovertebral    angle tenderness, and nausea and vomiting.  This is probably due to  a    partial small bowel obstruction.  There is no evidence of a urinary tract    infection, ureteral lithiasis, or diverticulitis at this time. 2. Adult onset diabetes mellitus. 3. Hypertension.  Blood pressure reading currently are a bit elevated, and    this probably owing to her pain. 4. Generalized pruritus.  Question is whether this is secondary to ciprofloxacin    which was started by Dr. Russella Dar in the emergency room.  PLAN:  For now we will place the patient on bowel rest.  Add NG tube to low intermittent suction.  Support her with IV fluids.  Will discontinue the ciprofloxacin.  Pain management right now will continue with Demerol 25 mg to 50 mg IV q.4h. along with Phenergan 12.5 mg IV q.4h.  Will add sliding scale insulin or diabetic management with regular capillary glucose checks. DD:  09/28/99 TD:  09/28/99 Job: [id number]

## 2010-10-17 NOTE — Consult Note (Signed)
[facility]  Patient:    [name], [name] Visit Number: [id number] MRN: [id number]          Service Type: MED Location: [phone] Attending Physician:  Justine Null Dictated by:   [name]. [name], M.D. Proc. Date: 06/12/00 Admit Date:  06/07/2001   CC:         Justine Null, M.D. [facility]  Molly Maduro D. Arlyce Dice, M.D. [facility]   Consultation Report  REASON FOR CONSULTATION:  Evaluate abdominal pain and possible bowel obstruction.  HISTORY OF PRESENT ILLNESS:  This is a very pleasant 65 year old African-American female who has an extensive surgical history, including multiple operations for small bowel obstruction.  She was admitted to this hospital on June 08, 2001, with a 24 hour history of left-sided abdominal pain, vomiting, and diarrhea.  A nasogastric tube was inserted.  She has had no further vomiting.  She has been passing lots of flatus but denies stool since admission.  She still has some left-sided abdominal pain.  She gets nauseated when her nasogastric tube gets clamped, although the NG output has not been that great compared to her p.o. intake.  She has been attempting to eat food, and that makes her nauseated.  She has been taking IV Demerol every three to four hours, according to her, for left-sided pain.  PAST MEDICAL HISTORY:  She had a cholecystectomy in the 1990s by Dr. Wiliam Ke. She has had an appendectomy in the past.  She had a total abdominal hysterectomy and right salpingo-oophorectomy in about 1970.  She had a left salpingo-oophorectomy in about 1980.  She has had five laparotomies for small bowel obstruction.  She recalls being explored at [facility] in [place], at which time she had a small bowel resection.  In 1997 Dr. Danna Hefty attempted laparotomy but aborted the laparotomy due to a "concrete abdomen." She felt that the abdomen could not be explored.  The patient has a history of diverticulitis.  She has  been hospitalized three times in the last 10 years for small bowel obstruction.  Each time it has been self-limited.  She has type 2 diabetes.  She has gastroesophageal reflux disease.  She has hypertension.  MEDICATIONS:  1. Lopid.  2. Potassium chloride.  3. Norvasc.  4. Glucophage.  5. Premarin.  6. Propylthiouracil.  7. Atenolol.  8. Aciphex.  9. Cozaar. 10. Darvocet. 11. Desyrel. 12. Lasix.  DRUG ALLERGIES:  MORPHINE, SULFA, CODEINE, PENICILLIN, IVP DYE.  PHYSICAL EXAMINATION:  VITAL SIGNS:  Temperature 97.0, blood pressure 123/70, heart rate 76, respiratory rate 18.  GENERAL:  This is a very pleasant, obese black female in no obvious distress. Nasogastric tube is in place.  She is sitting up now, having conversation with her family.  HEENT:  Sclerae clear.  Extraocular movements intact.  NECK:  Supple, nontender, no mass.  CHEST:  Clear to auscultation.  CARDIAC:  Regular rate and rhythm, no murmur.  ABDOMEN:  Significantly obese.  The abdomen has numerous scars present.  I do not detect a hernia, and there is no palpable mass.  She has hypoactive bowel sounds.  The abdomen is not noticeably distended.  She has mild tenderness to the left lower quadrant, which is reproducible, but there is no guarding or rebound.  INTAKE/OUTPUT:  Nasogastric output in the last 24 hours is 1150 cc, but her oral intake was 855 cc.  CT scan has been performed this admission and plain films have been performed.  None of these  are really impressive for a clear-cut diagnosis of small bowel obstruction.  This could be an ileus or could be a low-grade partial small obstruction.  There is lots of gas in the colon.  The CT contrast that was given orally has now passed completely through on the plain films that were taken yesterday.  Lab work shows hemoglobin 10.3, white count of 4400.  Basic metabolic panel is normal.  Liver function tests are normal.  TSH is 0.891.  Cardiac enzymes  were performed yesterday and are negative.  IMPRESSION: 1. Abdominal pain, nausea, vomiting, and diarrhea of uncertain etiology.  It    is certainly possible that this is a recurrent partial small bowel    obstruction or simply may be a motility disorder.  There is certainly no    clinical evidence for compromised bowel. 2. This patient is at extremely high risk for laparotomy, and I would advise    against surgical exploration unless she develops a life-threatening    abdominal problem.  RECOMMENDATIONS: 1. For now will continue the nasogastric tube suction and IV fluids. 2. Will get plain abdominal films and a small bowel follow-through tomorrow. 3. I would recommend decreasing her narcotic and substituting non-narcotic    analgesics, but will defer that to your discretion. Dictated by:   [name]. [name], M.D. Attending Physician:  Justine Null DD:  06/12/01 TD:  06/13/01 Job: [id number]

## 2010-10-17 NOTE — Consult Note (Signed)
NAMEGARIELLE, [name]                     ACCOUNT NO.:  [id number]   MEDICAL RECORD NO.:  [id number]                   PATIENT TYPE:  INP   LOCATION:  0350                                 FACILITY:  [facility]   PHYSICIAN:  Excell Seltzer. Annabell Howells, M.D.                 DATE OF BIRTH:  [birth year]   DATE OF CONSULTATION:  06/11/2003  DATE OF DISCHARGE:                                   CONSULTATION   PHYSICIAN:  A patient of Stacie Glaze, M.D.   CHIEF COMPLAINT:  Bilateral hydronephrosis.   HISTORY:  [name] is a 65 year old black female with a history of  chronic diverticular disease who was admitted with a diverticular abscess.  She had a CT scan which demonstrated mild bilateral hydronephrosis without  an obvious level of obstruction.  Her bladder was moderately distended on  both the scans from January 5th and June 09, 2003.  She has had abdominal  pain with her abscess but it does not lateralize reliably, sometimes right  and sometimes left.  She has a possible history of stones based no my review  of the notes but no other urologic history.  She denies any voiding  complaints at this time.  A recent urine culture grew yeast but was likely  from a contaminated urine since the urinalysis had only 0-2 white cells, few  bacteria and many epithelial cells.   PAST MEDICAL HISTORY:  Significant for INTOLERANCE to CODEINE, MORPHINE,  SULFA, and IVP DYE.   MEDICATIONS ON ADMISSION:  Include Slow-Mag, Lasix, spironolactone,  Phenergan, Premarin, folic acid, trazodone, Cozaar, and Aciphex.   Medical history is pertinent for a diet-controlled diabetes mellitus,  hypertension, hypercholesterolemia, history of anxiety and depression, and  the history of diverticular disease with abscess.  She also has GE reflux, a  chronic intermittent small-bowel obstruction and hyperthyroidism.  She also  has degenerative joint disease.  She has had surgery for a bowel  obstruction, a cholecystectomy, a  TAH and BSO as well as a lens implant in  the left eye.   SOCIAL HISTORY:  Negative for tobacco or alcohol.   FAMILY HISTORY:  Unremarkable.   REVIEW OF SYSTEMS:  She has chronic abdominal pain, some diarrhea, anxiety,  but denies hematuria or dysuria.  She denies any symptoms of diabetic  neuropathy.  She is otherwise without complaints.   PHYSICAL EXAMINATION:  VITAL SIGNS:  Blood pressure is 161/83, pulse 79,  temp 97.8.  GENERAL:  She is a well-developed, well-nourished black female in no acute  distress, alert and oriented x3.  HEENT:  Head and face are normocephalic, atraumatic.  LUNGS:  Clear with normal effort.  HEART:  Regular rate and rhythm.  ABDOMEN:  Soft, obese with healed surgical scars.  She has abdominal  tenderness with some more particular left flank tenderness.  No mass or  hepatosplenomegaly is noted.  No hernias are noted.  GENITOURINARY  AND RECTAL:  No performed.   LABORATORY DATA:  Urine from January 5th had 0-2 white cells, a few  bacteria, and many epithelial cells.  Urine culture from that day had  greater than 10 to the 5th yeast.  BUN and creatinine are 12 and 0.9.   IMPRESSION:  Mild bilateral hydronephrosis with some abdominal pain that may  be related to her diverticular abscess although left flank pain could be  from the dilated kidney.  This is associated with a distended bladder.  There is a history of possible urolithiasis in the past.   SECONDARY DIAGNOSES:  Include diet controlled diabetes, hypertension, and  chronic intermittent small-bowel obstruction as well as diverticular disease  with a current abscess.   RECOMMENDATIONS:  I would suggest at this time we place a Foley catheter and  perform a renal ultrasound in the morning.  If the hydronephrosis persists  with bladder decompression, then we may need to consider cystoscopy and  retrogrades at some point during the course of her therapy but at this time  she is relatively  asymptomatic.                                               Excell Seltzer. Annabell Howells, M.D.    JJW/MEDQ  D:  06/11/2003  T:  06/11/2003  Job:  [id number]   cc:   Stacie Glaze, M.D. [facility]   Lina Sar, M.D. [facility]

## 2010-10-17 NOTE — Consult Note (Signed)
[facility]  Patient:    [name], [name]                  MRN: [id number] Adm. Date:  [id number] Disc. Date: [id number] Attending:  Kelvin Cellar CC:         Anselmo Pickler, M.D.  Justine Null, M.D. [facility]  Dora M. Juanda Chance, M.D. [facility] T. Hoxworth, M.D.   Consultation Report  DATE OF BIRTH:  1946-05-05.  REASON:  Abdominal pain, adhesions.  HISTORY:  The patient is a 65 year old black female with complex history of GI problems, multiple partial small-bowel obstructions in the past due to severe adhesions, last admission lasting nine days.  She started to have abdominal pain around 8 oclock this morning, felt somewhat nauseated, could not eat or drink.  She presented to the ER, was given Toradol with some relief.  She continues to have some abdominal pain, has not been vomiting.  PAST MEDICAL HISTORY: 1. History of partial small-bowel obstruction multiple times, last admission    July 31, 2000 -- August 09, 2000, when she was seen by    Dr. Sharlet Salina T. Hoxworth, surgery, and Dr. Hedwig Morton. Brodie, GI.  Surgical    note says that surgery should be as a last resort.  The patient is not    willing to have surgery herself. 2. Severe abdominal adhesions. 3. B12 deficiency. 4. Hypertension. 5. Diabetes. 6. History of cholecystectomy, hysterectomy and surgeries for adhesions.  MEDICINES: 1. Aciphex 20 mg daily. 2. Premarin 0.9 mg daily. 3. Cozaar 50 mg daily. 4. Lasix 40 mg daily. 5. Norvasc 5 mg daily. 6. Atenolol 25 mg daily. 7. Glucophage 500 mg daily. 8. Gemfibrozil 650 mg b.i.d.  ALLERGIES:  CODEINE, MORPHINE, SULFA, PENICILLIN.  SOCIAL HISTORY:  She is married.  She is disabled.  FAMILY HISTORY:  Father had DVT, MI and diabetes.  Mother had diabetes.  REVIEW OF SYSTEMS:  Negative for nausea and vomiting.  Recurrent abdominal pains.  No weight loss.  No blood in the stool.  No syncope.  No chest pain. The rest is negative  or as above.  PHYSICAL EXAMINATION:  VITAL SIGNS:  Blood pressure 113/76, pulse 70, respirations 20, temperature 96.9.  GENERAL:  She is in no acute distress, looks tired.  HEENT:  With dryish mucosa.  NECK:  Neck supple.  No thyromegaly or bruit.  LUNGS:  Lungs clear to auscultation and percussion with decreased breath sounds bilaterally.  ABDOMEN:  Abdomen obese, soft, nontender, with multiple post-surgical scars, well-healed, no herniae, sensitive diffusely.  No frank pain.  No rebound symptoms.  Bowel sounds are hypoactive.  EXTREMITIES:  Lower extremities without edema.  NEUROLOGIC:  She is alert, oriented and cooperative.  LABORATORY DATA:  Acute abdominal series shows nonspecific gas pattern.  No changes on the chest x-ray.  WBC 3.7, hemoglobin 10.4, MCV 84.0.  Potassium 4.5, BUN 12.  ALT and AST normal.  Lipase 26.  ASSESSMENT AND PLAN: 1. Abdominal pain due to problem #2.  Will use Demerol p.r.n. 2. Probable early partial small-bowel obstruction.  Will give intravenous    fluids.  She will stay n.p.o. for now, then progress with clear liquids.    She will follow up with Dr. Justine Null and Dr. Juanda Chance.  She will    return to emergency room if worse.  I gave her some Phenergan to take for    nausea, 25 mg q.i.d.  I offered her hospitalization for  24-hour    observation; she refused. 3. Diabetes mellitus, type 2.  Continue current medicines at home and resume    oral diet. 4. Hypertension, controlled. DD:  09/16/00 TD:  09/17/00 Job: [id number]

## 2010-10-17 NOTE — Discharge Summary (Signed)
[facility]  Patient:    [name], [name]                  MRN: [id number] Adm. Date:  [id number] Disc. Date: 08/09/00 Attending:  Carrie Mew CC:         Hedwig Morton. Juanda Chance, M.D. [facility]  [facility]   Discharge Summary  ADMITTING DIAGNOSES: 1. Left lower quadrant pain. 2. Partial small bowel obstruction.  DISCHARGE DIAGNOSES: 1. Left lower quadrant pain, resolved. 2. Partial small bowel obstruction, resolved. 3. Severe abdominal adhesive disease. 4. Diabetes. 5. Hypertension. 6. Nausea and vomiting, resolved.  HISTORY OF PRESENT ILLNESS:  The patient is a complicated 65 year old, African-American female with a history of small bowel obstruction secondary to adhesions last admitted on September 28, 1999, by Dr. Lina Sar.  She presented in the emergency room with a history of increasing abdominal pain with nausea and vomiting and was found to have partial small bowel obstruction on CT.  She has a history of dense adhesions due to prior abdominal surgery and had had three laparotomies for adhesion breakdown.  HOSPITAL COURSE:  She had a long and complicated hospital course with GI consulting.  She was initially placed NPO with suction and on complete bowel rest.  When her nausea and vomiting abated, she was tried on clear liquids and her diet was advanced.  Advancing her diet was unsuccessful and on the fourth day of admission, she required an upper GI with small bowel follow through showing some persistent partial small bowel obstruction.  GI began the patient on MiraLax and general surgery consult was obtained on August 06, 2000, which felt we would not proceed with an operation at this time. She was found to be B12 deficient and was begun on B12 by IM injection and followed over the weekend.  She had decreased pain and was tolerating a regular diet without nausea and vomiting on August 08, 2000, and was deemed stable for discharge on  August 09, 2000.  SPECIAL INSTRUCTIONS:  Continue all home medications for hypertension, diabetes.  Refrain from taking codeine containing medications because of the constipating potential.  DISCHARGE MEDICATIONS: 1. MiraLax 17 g twice day. 2. Ultram 50 mg one to two tablets every six hours for pain. 3. Potassium 10 mEq p.o. q.d.  FOLLOWUP:  Follow up with Dr. Juanda Chance in four weeks and receive B12 injections monthly from her primary care doctor. DD:  08/09/00 TD:  08/09/00 Job: [id number]

## 2010-10-17 NOTE — Discharge Summary (Signed)
[facility]  Patient:    [name], [name] Visit Number: [id number] MRN: [id number]          Service Type: MED Location: 3W [id number] 01 Attending Physician:  Verdene Lennert Dictated by:   Stacie Glaze, M.D. [facility] Admit Date:  06/25/2001 Disc. Date: 07/01/01   CC:         Justine Null, M.D. [facility]   Discharge Summary  ADMITTING DIAGNOSES: 1. Small bowel obstruction. 2. Chronic abdominal adhesions.  DISCHARGE DIAGNOSES: 1. Small bowel obstruction. 2. Chronic abdominal adhesions. 3. Chronic abdominal pain. 4. Obstipation.  HISTORY OF PRESENT ILLNESS:  The patient is a complicated, 65 year old, African-American female with a history of abdominal adhesions from prior abdominal surgeries who has been evaluated by surgery in the past and had had failed attempt at lysis of her lesions.  Consultation obtained in this hospitalization when she presented with signs of small bowel obstruction with surgery indicated that surgical intervention was less than optimal and recommended medical management.  HOSPITAL COURSE:  She was placed NPO and given IV resuscitation and bowel rest.  When NG tube was placed, NG suction was used to alleviate nausea. After 48 hours, the NG tube was discontinued.  She was begun on clear liquids and a program of hot water enema and MiraLax.  Her bowels began to respond. She was having regular bowel movements at the time of discharge sitting in her seat discussing her care with her physician.  Her main concern was pain management and during her last two days of her hospitalization, she has strongly requested additional pain pills.  She states that the Duragesic patch is insufficient and she requires additional pain medications.  Prior to admission, she was on multiple medications including Demerol, Phenergan and OxyContin.  It is fearful that these complicate her picture and attempts were made to explain to the patient  that the use of heavy use of certain narcotic pain medications will result in constipation and the bowel obstruction picture that she presents with.  In consultation with pharmacy, it was decided to continue the Duragesic patch because it was felt to be a less constipating pain medicine and to give her morphine sulfate immediate relief for severe pain.  She was instructed to use this no more than three times a day.  She was given Atarax 25 mg p.o. b.i.d. and Levbid 1 p.o. b.i.d. for her itching and bowel spasms.  CONDITION ON DISCHARGE:  At the time of discharge, her vital signs were stable.  She was afebrile and she was in moderate abdominal pain, but was having regular bowel movements.  She is discharged to home in stable condition.  FOLLOWUP:  Follow up with Dr. [name] on Monday, at 2:30 p.m. in his office.  DISCHARGE MEDICATIONS: 1. Limited amount of morphine sulfate IR. 2. Duragesic patches. 3. Atarax. 4. Levbid. 5. Resume all blood pressure medications and other medications as appropriate. ictated by:   Stacie Glaze, M.D. [facility] Attending Physician:  Verdene Lennert DD:  07/01/01 TD:  07/01/01 Job: [id number]

## 2010-10-17 NOTE — Discharge Summary (Signed)
NAMEHAROLD, [name]                     ACCOUNT NO.:  [id number]   MEDICAL RECORD NO.:  [id number]                   PATIENT TYPE:  INP   LOCATION:  5011                                 FACILITY:  [facility]   PHYSICIAN:  Rene Paci, M.D. [facility]          DATE OF BIRTH:  Nov 25, 1945   DATE OF ADMISSION:  08/12/2002  DATE OF DISCHARGE:  08/15/2002                                 DISCHARGE SUMMARY   DISCHARGE DIAGNOSES:  1. Acute abdominal pain.  2. Mild inflammatory changes of the small bowel.  3. Normocytic anemia.  4. Heme-positive stool.  5. Bacterial vaginitis.   BRIEF ADMISSION HISTORY:  The patient is a 65 year old African American  female who presented with a one-day history of severe generalized crampy  abdominal pain.  She had also complained of nausea, vomiting, and chills.   PAST MEDICAL HISTORY:  1. Chronic abdominal pain with a history of intermittent small bowel     obstruction and adhesions, status post small and large intestine     resections.  2. Gastroesophageal reflux disease.  3. Diverticulosis.  4. Irritable bowel syndrome.  5. Hyperthyroidism, currently on PTU.  6. Adult onset diabetes mellitus, diet controlled.  7. Hypertension.  8. Obesity.  9. Depression.  10.      Hyperlipidemia.  11.      Degenerative joint disease.  12.      Anemia.  13.      Status post cholecystectomy.  14.      Status post total abdominal hysterectomy with bilateral salpingo-     oophorectomy secondary to fibroid tumors.  15.      Status post appendectomy.  16.      Status post endoscopy in September 2002 revealing chronic     gastritis, gastroesophageal reflux disease, and hiatal hernia.  17.      Status post colonoscopy in September 2002 revealing diverticulosis     with internal hemorrhoids.   HOSPITAL COURSE:  Problem 1:  ACUTE ON CHRONIC ABDOMINAL PAIN:  CT scan of  the abdomen revealed vague, mild inflammation of the small bowel and the  patient was seen by  surgery.  There was no evidence for acute abdomen or  acute surgical intervention.  They agreed with bowel rest and IV fluids.  In  the past, the patient has been seen by Dr. Luan Pulling.  Of note, the patient  is status post appendectomy.  The patient's pain has resolved.  Currently,  she is tolerating clears and her diet has been advanced.  There is no  indication for further workup.   Problem 2:  NORMOCYTIC ANEMIA:  She was hemodynamically stable.  Iron  studies and B12 were normal.  She did have one heme-positive stool, which  can probably be explained by her history of internal hemorrhoids.  As noted,  the patient is status post endoscopy and colonoscopy in September 2002 and a  small bowel follow-through in October 2003.  She  has had extensive GI  workup.   Problem 3:  INFECTIOUS DISEASE:  The patient is afebrile.  White count was  normal.  The patient's wet prep on admission revealed clue cells and white  cells.  This is consistent with Gardnerella and bacterial vaginosis.  She  was empirically started on Cipro and Flagyl.  We will have her complete a  full five days of Flagyl as an outpatient.   DISCHARGE LABORATORY DATA:  BMET was normal.  Hemoglobin was 9.1.  Iron  studies were normal.  B12 was normal.  Folate was normal.  LFT's:  AST was  elevated at 39, ALT was 69, and alkaline phosphatase was 134.   DISCHARGE MEDICATIONS:  1. Desyrel 200 mg q.h.s.  2. Potassium chloride 10 mEq daily.  3. Lasix 40 mg daily.  4. Aciphex 20 mg daily.  5. Premarin 0.3 mg as at home.  6. Propylthiouracil  50 mg daily.  7. Phenergan as needed.  8. Darvocet as needed.  9. Lomotil as needed.  10.      Flagyl 500 mg t.i.d. for four more days.   FOLLOW UP:  Follow up with Dr. Everardo All in 1-2 weeks.     Cornell Barman, P.A. [facility]                  Rene Paci, M.D. [facility]    LC/MEDQ  D:  08/15/2002  T:  08/16/2002  Job:  [id number]   cc:   Vikki Ports, M.D.  [address],  [address]  [place]  [place] [address]  Fax: [phone]

## 2010-10-30 ENCOUNTER — Other Ambulatory Visit: Payer: Self-pay | Admitting: Otolaryngology

## 2010-10-31 ENCOUNTER — Ambulatory Visit
Admission: RE | Admit: 2010-10-31 | Discharge: 2010-10-31 | Disposition: A | Discharge status: Home / Self Care | Payer: Medicare Other | Source: Ambulatory Visit | Attending: Otolaryngology | Admitting: Otolaryngology

## 2010-11-11 ENCOUNTER — Other Ambulatory Visit: Payer: Self-pay | Admitting: *Deleted

## 2010-11-11 MED ORDER — OXYCODONE-ACETAMINOPHEN 10-325 MG PO TABS: ORAL_TABLET | ORAL | Status: DC

## 2010-11-11 NOTE — Telephone Encounter (Signed)
Pt called requesting refill of pain meds-SAE pt-please advise  Last OV-08/14/2010  Last Written-09/29/2010-#100 x 0

## 2010-11-11 NOTE — Telephone Encounter (Signed)
Pt's spouse informed, rx upfront in cabinet ready for pickup.

## 2010-11-13 ENCOUNTER — Encounter (HOSPITAL_BASED_OUTPATIENT_CLINIC_OR_DEPARTMENT_OTHER): Payer: Medicare Other

## 2010-11-14 ENCOUNTER — Ambulatory Visit (INDEPENDENT_AMBULATORY_CARE_PROVIDER_SITE_OTHER): Payer: Medicare Other | Admitting: *Deleted

## 2010-11-14 DIAGNOSIS — E538 Deficiency of other specified B group vitamins: Secondary | ICD-10-CM

## 2010-11-14 MED ORDER — CYANOCOBALAMIN 1000 MCG/ML IJ SOLN
1000.0000 ug | Freq: Once | INTRAMUSCULAR | Status: AC
  Administered 2010-11-14: 1000 ug via INTRAMUSCULAR

## 2010-11-26 ENCOUNTER — Other Ambulatory Visit (INDEPENDENT_AMBULATORY_CARE_PROVIDER_SITE_OTHER): Payer: Self-pay

## 2010-11-26 ENCOUNTER — Encounter: Payer: Self-pay | Admitting: Endocrinology

## 2010-11-26 ENCOUNTER — Ambulatory Visit (INDEPENDENT_AMBULATORY_CARE_PROVIDER_SITE_OTHER): Payer: Self-pay | Admitting: Endocrinology

## 2010-11-26 VITALS — BP 110/78 | HR 71 | Temp 97.8°F | Ht 65.5 in | Wt 222.2 lb

## 2010-11-26 DIAGNOSIS — E119 Type 2 diabetes mellitus without complications: Secondary | ICD-10-CM

## 2010-11-26 DIAGNOSIS — S301XXA Contusion of abdominal wall, initial encounter: Secondary | ICD-10-CM

## 2010-11-26 DIAGNOSIS — R609 Edema, unspecified: Secondary | ICD-10-CM

## 2010-11-26 LAB — URINALYSIS, ROUTINE W REFLEX MICROSCOPIC
Ketones, ur: NEGATIVE
Specific Gravity, Urine: >=1.03 (ref 1.000–1.030)
Total Protein, Urine: NEGATIVE
Urine Glucose: NEGATIVE
Urobilinogen, UA: 0.2 (ref 0.0–1.0)
pH: 6 (ref 5.0–8.0)

## 2010-11-26 LAB — HEMOGLOBIN A1C: Hgb A1c MFr Bld: 5.8 % (ref 4.6–6.5)

## 2010-11-26 LAB — BRAIN NATRIURETIC PEPTIDE: Pro B Natriuretic peptide (BNP): 88 pg/mL (ref 0.0–100.0)

## 2010-11-26 NOTE — Patient Instructions (Addendum)
Blood tests, and a urine test is being ordered for you today.  please call [phone] to hear your test results.  You will be prompted to enter the 9-digit "MRN" number that appears at the top left of this page, followed by #.  Then you will hear the message. I hope you feel better soon.  If you don't feel better by next week, please call back. (update: i left message on phone-tree:  bp is too low to start lasix
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
## 2010-12-12 ENCOUNTER — Other Ambulatory Visit: Payer: Self-pay

## 2010-12-12 MED ORDER — OXYCODONE-ACETAMINOPHEN 10-325 MG PO TABS: ORAL_TABLET | ORAL | Status: DC

## 2010-12-12 NOTE — Telephone Encounter (Signed)
Pt informed, Rx in cabinet for pt pick up  

## 2010-12-15 ENCOUNTER — Ambulatory Visit (INDEPENDENT_AMBULATORY_CARE_PROVIDER_SITE_OTHER): Payer: Medicare Other | Admitting: *Deleted

## 2010-12-15 DIAGNOSIS — E538 Deficiency of other specified B group vitamins: Secondary | ICD-10-CM

## 2010-12-15 MED ORDER — CYANOCOBALAMIN 1000 MCG/ML IJ SOLN
1000.0000 ug | Freq: Once | INTRAMUSCULAR | Status: AC
  Administered 2010-12-15: 1000 ug via INTRAMUSCULAR

## 2010-12-16 ENCOUNTER — Other Ambulatory Visit: Payer: Self-pay | Admitting: Endocrinology

## 2010-12-29 ENCOUNTER — Ambulatory Visit (INDEPENDENT_AMBULATORY_CARE_PROVIDER_SITE_OTHER): Payer: Medicare Other | Admitting: Endocrinology

## 2010-12-29 ENCOUNTER — Encounter: Payer: Self-pay | Admitting: Endocrinology

## 2010-12-29 VITALS — BP 124/80 | HR 76 | Temp 98.1°F | Ht 65.5 in | Wt 218.0 lb

## 2010-12-29 DIAGNOSIS — R51 Headache: Secondary | ICD-10-CM

## 2010-12-29 DIAGNOSIS — R519 Headache, unspecified: Secondary | ICD-10-CM | POA: Insufficient documentation
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
## 2010-12-29 NOTE — Patient Instructions (Addendum)
Refer to a headache specialist.  you will receive a phone call, with a day and time for an appointment. Until then, you can take the pain medication as needed for the headache.

## 2010-12-30 ENCOUNTER — Other Ambulatory Visit: Payer: Self-pay | Admitting: Gastroenterology

## 2011-01-14 ENCOUNTER — Other Ambulatory Visit: Payer: Self-pay | Admitting: *Deleted

## 2011-01-14 ENCOUNTER — Ambulatory Visit (INDEPENDENT_AMBULATORY_CARE_PROVIDER_SITE_OTHER): Payer: Medicare Other | Admitting: *Deleted

## 2011-01-14 DIAGNOSIS — E538 Deficiency of other specified B group vitamins: Secondary | ICD-10-CM

## 2011-01-14 MED ORDER — OXYCODONE-ACETAMINOPHEN 10-325 MG PO TABS: ORAL_TABLET | ORAL | Status: DC

## 2011-01-14 NOTE — Telephone Encounter (Signed)
Rx placed upfront in cabinet ready for pickup. Pt informed.

## 2011-01-14 NOTE — Telephone Encounter (Signed)
Pt is requesting a refill of her pain medication-please advise in SAE's absence-last written 12/12/2010 #100 with 0 refills

## 2011-01-29 ENCOUNTER — Ambulatory Visit (INDEPENDENT_AMBULATORY_CARE_PROVIDER_SITE_OTHER): Payer: Medicare Other | Admitting: Endocrinology

## 2011-01-29 ENCOUNTER — Encounter: Payer: Self-pay | Admitting: Endocrinology

## 2011-01-29 ENCOUNTER — Other Ambulatory Visit (INDEPENDENT_AMBULATORY_CARE_PROVIDER_SITE_OTHER): Payer: Medicare Other

## 2011-01-29 DIAGNOSIS — D509 Iron deficiency anemia, unspecified: Secondary | ICD-10-CM

## 2011-01-29 DIAGNOSIS — E119 Type 2 diabetes mellitus without complications: Secondary | ICD-10-CM

## 2011-01-29 DIAGNOSIS — M791 Myalgia, unspecified site: Secondary | ICD-10-CM | POA: Insufficient documentation

## 2011-01-29 DIAGNOSIS — IMO0001 Reserved for inherently not codable concepts without codable children: Secondary | ICD-10-CM

## 2011-01-29 DIAGNOSIS — E042 Nontoxic multinodular goiter: Secondary | ICD-10-CM

## 2011-01-29 LAB — SEDIMENTATION RATE: Sed Rate: 27 mm/hr — ABNORMAL HIGH (ref 0–22)

## 2011-01-29 LAB — BASIC METABOLIC PANEL
BUN: 12 mg/dL (ref 6–23)
CO2: 28 mEq/L (ref 19–32)
Chloride: 107 mEq/L (ref 96–112)
Creatinine, Ser: 0.7 mg/dL (ref 0.4–1.2)

## 2011-01-29 LAB — CBC WITH DIFFERENTIAL/PLATELET
Basophils Relative: 0.4 % (ref 0.0–3.0)
Eosinophils Absolute: 0 10*3/uL (ref 0.0–0.7)
HCT: 33.1 % — ABNORMAL LOW (ref 36.0–46.0)
Lymphs Abs: 1.4 10*3/uL (ref 0.7–4.0)
MCHC: 33 g/dL (ref 30.0–36.0)
MCV: 89.3 fl (ref 78.0–100.0)
Monocytes Absolute: 0.3 10*3/uL (ref 0.1–1.0)
Neutrophils Relative %: 58.5 % (ref 43.0–77.0)
Platelets: 239 10*3/uL (ref 150.0–400.0)

## 2011-01-29 LAB — IBC PANEL
Iron: 98 ug/dL (ref 42–145)
Saturation Ratios: 24.7 % (ref 20.0–50.0)
Transferrin: 283.1 mg/dL (ref 212.0–360.0)

## 2011-01-29 MED ORDER — TRAZODONE HCL 300 MG PO TABS: 300.0000 mg | ORAL_TABLET | Freq: Every day | ORAL | Status: DC

## 2011-01-29 NOTE — Patient Instructions (Addendum)
blood tests are being requested for you today.  please call [phone] to hear your test results.  You will be prompted to enter the 9-digit "MRN" number that appears at the top left of this page, followed by #.  Then you will hear the message. Please make a regular physical appointment in 4 months Increase trazodone to 300 mg at bedtime. (update: i left message on phone-tree:  We'll follow anemia)

## 2011-01-29 NOTE — Progress Notes (Signed)
Subjective:    Patient ID: [name], female    DOB: 10/17/1945, 65 y.o.   MRN: [id number]  HPI Pt had i-131 rx for hyperthyroidism due to multinodular goiter, 2 years ago.  She feel tired She reports a few weeks of moderate myalgias, throughout the body, and assoc insomnina. Past Medical History  Diagnosis Date  . Obesity   . PUD (peptic ulcer disease)   . Anemia   . Leukopenia   . Abdominal pain   . Short bowel syndrome   . Internal hemorrhoids without mention of complication   . Stricture and stenosis of esophagus   . Hiatal hernia   . Osteoarthrosis, unspecified whether generalized or localized, unspecified site   . Thyrotoxicosis without mention of goiter or other cause, without mention of thyrotoxic crisis or storm   . Unspecified essential hypertension   . Other and unspecified hyperlipidemia   . Gout, unspecified   . Diverticulosis of colon (without mention of hemorrhage)   . Type II or unspecified type diabetes mellitus without mention of complication, not stated as uncontrolled   . C. difficile colitis   . VITAMIN B12 DEFICIENCY 08/30/2009  . GOITER, MULTINODULAR 04/02/2009  . HYPOTHYROIDISM, POST-RADIATION 08/13/2009  . ANEMIA, IRON DEFICIENCY 05/08/2009  . ASYMPTOMATIC POSTMENOPAUSAL STATUS 10/11/2008  . Esophageal reflux 06/12/2008    Past Surgical History  Procedure Date  . Lysis adhension     multiple  . Cholecystectomy   . Vein ligation and stripping     Right leg  . Small intestine surgery     multiple  . Abdominal hysterectomy     with BSO  . Lithotripsy   . Bunionectomy     bilateral  . Colonoscopy   . Esophagogastroduodenoscopy 07/15/2005  . Thyroid ultrasound 12/1994 and 12/1995    History   Social History  . Marital Status: Married    Spouse Name: N/A    Number of Children: N/A  . Years of Education: N/A   Occupational History  . Not on file.   Social History Main Topics  . Smoking status: Former Games developer  . Smokeless tobacco: Not  on file  . Alcohol Use: No  . Drug Use: No  . Sexually Active: Not on file   Other Topics Concern  . Not on file   Social History Narrative   Pt gets regular exercise    Current Outpatient Prescriptions on File Prior to Visit  Medication Sig Dispense Refill  . Calcium Carbonate-Vitamin D (CALCIUM-VITAMIN D) 600-200 MG-UNIT CAPS Take 1 capsule by mouth daily.        . cholestyramine (QUESTRAN) 4 G packet Questran 4grams in non carbonated liquid daily by mouth- mix in a non carbonated liquid and take midmorning 2 hours apart from other meds  60 each  11  . cyanocobalamin (,VITAMIN B-12,) 1000 MCG/ML injection Inject 1,000 mcg into the muscle every 30 (thirty) days.        Marland Kitchen levothyroxine (SYNTHROID, LEVOTHROID) 125 MCG tablet 1 TAB ONCE DAILY  30 tablet  5  . lovastatin (MEVACOR) 20 MG tablet TAKE 1 TABLET EVERY DAY  30 tablet  5  . magnesium chloride (SLOW-MAG) 64 MG TBEC Take 2 tablets by mouth daily.        Marland Kitchen oxyCODONE-acetaminophen (PERCOCET) 10-325 MG per tablet One tablet by mouth every 6-8 hours as needed  100 tablet  0  . potassium chloride (KLOR-CON) 20 MEQ packet Take 20 mEq by mouth 3 (three) times daily.        Marland Kitchen  promethazine (PHENERGAN) 25 MG tablet TAKE 1 TABLET BY MOUTH EVERY 6 HOURS AS NEEDED FOR NAUSEA  30 tablet  1  . thiamine 100 MG tablet Take 100 mg by mouth daily.          Allergies  Allergen Reactions  . Aspirin     REACTION: Gi Intolerance/ Burning in stomach  . Morphine     REACTION: itching  . Iodine Rash    Allergic to IVP dye    Family History  Problem Relation Age of Onset  . Esophageal cancer Son     deceased  . Cancer Son     Esophageal Cancer  . Diabetes Mother   . Heart disease Mother   . Colon cancer Paternal Uncle   . Cancer Paternal Uncle     Colon Cancer  . Kidney disease Sister   . Diabetes Father   . Kidney disease Brother   . Kidney disease Brother   . Kidney disease Brother    BP 124/88  Pulse 73  Temp(Src) 97.9 F (36.6  C) (Oral)  Ht 5\' 5"  (1.651 m)  Wt 219 lb 12.8 oz (99.701 kg)  BMI 36.58 kg/m2  SpO2 98%  Review of Systems Denies weight change and fever.    Objective:   Physical Exam VITAL SIGNS:  See vs page. GENERAL: no distress.  Obese. There is no palpable thyroid enlargement.  No thyroid nodule is palpable.  No palpable lymphadenopathy at the anterior neck. Msk:  Thighs are slightly tender to touch.   Lab Results  Component Value Date   TSH 1.22 01/29/2011   T4TOTAL 8.1 07/29/2007   Lab Results  Component Value Date   WBC 4.3* 01/29/2011   HGB 10.9* 01/29/2011   HCT 33.1* 01/29/2011   MCV 89.3 01/29/2011   PLT 239.0 01/29/2011      Assessment & Plan:  Post-i-131 hypothyroidism, well-repalced Myalgias, uncertain etiology Anemia, chronic, mild, uncertain etiology Insomnia, needs increased rx

## 2011-02-10 DIAGNOSIS — E538 Deficiency of other specified B group vitamins: Secondary | ICD-10-CM

## 2011-02-10 MED ORDER — CYANOCOBALAMIN 1000 MCG/ML IJ SOLN
1000.0000 ug | Freq: Once | INTRAMUSCULAR | Status: AC
  Administered 2011-02-10: 1000 ug via INTRAMUSCULAR

## 2011-02-13 ENCOUNTER — Ambulatory Visit (INDEPENDENT_AMBULATORY_CARE_PROVIDER_SITE_OTHER): Payer: Medicare Other | Admitting: Endocrinology

## 2011-02-13 ENCOUNTER — Ambulatory Visit: Payer: Medicare Other

## 2011-02-13 ENCOUNTER — Encounter: Payer: Self-pay | Admitting: Endocrinology

## 2011-02-13 VITALS — BP 128/86 | HR 76 | Temp 97.8°F | Ht 65.0 in | Wt 218.4 lb

## 2011-02-13 DIAGNOSIS — IMO0001 Reserved for inherently not codable concepts without codable children: Secondary | ICD-10-CM

## 2011-02-13 DIAGNOSIS — M791 Myalgia, unspecified site: Secondary | ICD-10-CM

## 2011-02-13 MED ORDER — OXYCODONE-ACETAMINOPHEN 10-325 MG PO TABS: ORAL_TABLET | ORAL | Status: DC

## 2011-02-13 MED ORDER — OXYCODONE HCL 10 MG PO TB12: 10.0000 mg | ORAL_TABLET | Freq: Two times a day (BID) | ORAL | Status: AC

## 2011-02-13 NOTE — Patient Instructions (Addendum)
Refer to an orthopedic specialist.  you will receive a phone call, about a day and time for an appointment. Add oxycontin 10 mg 2x a day.  You can still take the percocet as needed. Reduce the trazodone to 1/2 pill per day.

## 2011-02-13 NOTE — Progress Notes (Signed)
Subjective:    Patient ID: [name], female    DOB: 1945/10/19, 65 y.o.   MRN: [id number]  HPI Pt states few weeks of moderate diffuse body pain, worst at the thighs.  She has assoc bruise at the posterior right knee.  She says percocet "sometimes helps if i take 2 of them."  She says pain is just as bad at night, as in the day.  Past Medical History  Diagnosis Date  . Obesity   . PUD (peptic ulcer disease)   . Anemia   . Leukopenia   . Abdominal pain   . Short bowel syndrome   . Internal hemorrhoids without mention of complication   . Stricture and stenosis of esophagus   . Hiatal hernia   . Osteoarthrosis, unspecified whether generalized or localized, unspecified site   . Thyrotoxicosis without mention of goiter or other cause, without mention of thyrotoxic crisis or storm   . Unspecified essential hypertension   . Other and unspecified hyperlipidemia   . Gout, unspecified   . Diverticulosis of colon (without mention of hemorrhage)   . Type II or unspecified type diabetes mellitus without mention of complication, not stated as uncontrolled   . C. difficile colitis   . VITAMIN B12 DEFICIENCY 08/30/2009  . GOITER, MULTINODULAR 04/02/2009  . HYPOTHYROIDISM, POST-RADIATION 08/13/2009  . ANEMIA, IRON DEFICIENCY 05/08/2009  . ASYMPTOMATIC POSTMENOPAUSAL STATUS 10/11/2008  . Esophageal reflux 06/12/2008    Past Surgical History  Procedure Date  . Lysis adhension     multiple  . Cholecystectomy   . Vein ligation and stripping     Right leg  . Small intestine surgery     multiple  . Abdominal hysterectomy     with BSO  . Lithotripsy   . Bunionectomy     bilateral  . Colonoscopy   . Esophagogastroduodenoscopy 07/15/2005  . Thyroid ultrasound 12/1994 and 12/1995    History   Social History  . Marital Status: Married    Spouse Name: N/A    Number of Children: N/A  . Years of Education: N/A   Occupational History  . Not on file.   Social History Main Topics  .  Smoking status: Former Games developer  . Smokeless tobacco: Not on file  . Alcohol Use: No  . Drug Use: No  . Sexually Active: Not on file   Other Topics Concern  . Not on file   Social History Narrative   Pt gets regular exercise    Current Outpatient Prescriptions on File Prior to Visit  Medication Sig Dispense Refill  . Calcium Carbonate-Vitamin D (CALCIUM-VITAMIN D) 600-200 MG-UNIT CAPS Take 1 capsule by mouth daily.        . cholestyramine (QUESTRAN) 4 G packet Questran 4grams in non carbonated liquid daily by mouth- mix in a non carbonated liquid and take midmorning 2 hours apart from other meds  60 each  11  . cyanocobalamin (,VITAMIN B-12,) 1000 MCG/ML injection Inject 1,000 mcg into the muscle every 30 (thirty) days.        Marland Kitchen levothyroxine (SYNTHROID, LEVOTHROID) 125 MCG tablet 1 TAB ONCE DAILY  30 tablet  5  . lovastatin (MEVACOR) 20 MG tablet TAKE 1 TABLET EVERY DAY  30 tablet  5  . magnesium chloride (SLOW-MAG) 64 MG TBEC Take 2 tablets by mouth daily.        . potassium chloride (KLOR-CON) 20 MEQ packet Take 20 mEq by mouth 3 (three) times daily.        Marland Kitchen  promethazine (PHENERGAN) 25 MG tablet TAKE 1 TABLET BY MOUTH EVERY 6 HOURS AS NEEDED FOR NAUSEA  30 tablet  1  . thiamine 100 MG tablet Take 100 mg by mouth daily.        . trazodone (DESYREL) 300 MG tablet Take 1 tablet (300 mg total) by mouth at bedtime.  30 tablet  11    Allergies  Allergen Reactions  . Aspirin     REACTION: Gi Intolerance/ Burning in stomach  . Morphine     REACTION: itching  . Iodine Rash    Allergic to IVP dye    Family History  Problem Relation Age of Onset  . Esophageal cancer Son     deceased  . Cancer Son     Esophageal Cancer  . Diabetes Mother   . Heart disease Mother   . Colon cancer Paternal Uncle   . Cancer Paternal Uncle     Colon Cancer  . Kidney disease Sister   . Diabetes Father   . Kidney disease Brother   . Kidney disease Brother   . Kidney disease Brother     BP  128/86  Pulse 76  Temp(Src) 97.8 F (36.6 C) (Oral)  Ht 5\' 5"  (1.651 m)  Wt 218 lb 6.4 oz (99.066 kg)  BMI 36.34 kg/m2  SpO2 98%  Review of Systems Denies rash and fever.    Objective:   Physical Exam VITAL SIGNS:  See vs page GENERAL: no distress Thighs: nontender Knees: full rom, without pain. Gait: normal, but painful.     Assessment & Plan:  Myalgias, uncertain etiology Insomnia.  Trazodone could interact with oxycodone

## 2011-03-06 LAB — BASIC METABOLIC PANEL
CO2: 27 mEq/L (ref 19–32)
Calcium: 8.4 mg/dL (ref 8.4–10.5)
Creatinine, Ser: 0.64 mg/dL (ref 0.4–1.2)
GFR calc non Af Amer: >60 mL/min (ref >60)
Glucose, Bld: 88 mg/dL (ref 70–99)

## 2011-03-06 LAB — GLUCOSE, CAPILLARY
Glucose-Capillary: 105 mg/dL — ABNORMAL HIGH (ref 70–99)
Glucose-Capillary: 135 mg/dL — ABNORMAL HIGH (ref 70–99)
Glucose-Capillary: 76 mg/dL (ref 70–99)
Glucose-Capillary: 88 mg/dL (ref 70–99)
Glucose-Capillary: 88 mg/dL (ref 70–99)
Glucose-Capillary: 88 mg/dL (ref 70–99)
Glucose-Capillary: 93 mg/dL (ref 70–99)
Glucose-Capillary: 94 mg/dL (ref 70–99)

## 2011-03-06 LAB — COMPREHENSIVE METABOLIC PANEL
ALT: 15 U/L (ref 0–35)
AST: 24 U/L (ref 0–37)
Albumin: 3 g/dL — ABNORMAL LOW (ref 3.5–5.2)
Calcium: 8.3 mg/dL — ABNORMAL LOW (ref 8.4–10.5)
GFR calc Af Amer: >60 mL/min (ref >60)
Glucose, Bld: 106 mg/dL — ABNORMAL HIGH (ref 70–99)
Sodium: 141 mEq/L (ref 135–145)
Total Protein: 6.4 g/dL (ref 6.0–8.3)

## 2011-03-06 LAB — DIFFERENTIAL
Eosinophils Absolute: 0.1 10*3/uL (ref 0.0–0.7)
Lymphocytes Relative: 32 % (ref 12–46)
Lymphs Abs: 1.3 10*3/uL (ref 0.7–4.0)
Neutro Abs: 2.2 10*3/uL (ref 1.7–7.7)
Neutrophils Relative %: 54 % (ref 43–77)

## 2011-03-06 LAB — URINE CULTURE
Colony Count: NO GROWTH
Culture: NO GROWTH

## 2011-03-06 LAB — CBC
HCT: 28.3 % — ABNORMAL LOW (ref 36.0–46.0)
Hemoglobin: 9.3 g/dL — ABNORMAL LOW (ref 12.0–15.0)
MCHC: 32.9 g/dL (ref 30.0–36.0)
MCHC: 33.2 g/dL (ref 30.0–36.0)
MCV: 90.9 fL (ref 78.0–100.0)
Platelets: 245 10*3/uL (ref 150–400)
Platelets: 258 10*3/uL (ref 150–400)
Platelets: 266 10*3/uL (ref 150–400)
RBC: 3.12 MIL/uL — ABNORMAL LOW (ref 3.87–5.11)
RDW: 13.2 % (ref 11.5–15.5)
RDW: 13.4 % (ref 11.5–15.5)
WBC: 4 10*3/uL (ref 4.0–10.5)
WBC: 4.1 10*3/uL (ref 4.0–10.5)

## 2011-03-06 LAB — CARDIAC PANEL(CRET KIN+CKTOT+MB+TROPI)
CK, MB: 2.8 ng/mL (ref 0.3–4.0)
Total CK: 347 U/L — ABNORMAL HIGH (ref 7–177)

## 2011-03-06 LAB — BASIC METABOLIC PANEL WITH GFR
BUN: 2 mg/dL — ABNORMAL LOW (ref 6–23)
CO2: 26 meq/L (ref 19–32)
Calcium: 8 mg/dL — ABNORMAL LOW (ref 8.4–10.5)
Chloride: 106 meq/L (ref 96–112)
Creatinine, Ser: 0.69 mg/dL (ref 0.4–1.2)
GFR calc non Af Amer: >60 mL/min
Glucose, Bld: 84 mg/dL (ref 70–99)
Potassium: 3 meq/L — ABNORMAL LOW (ref 3.5–5.1)
Sodium: 138 meq/L (ref 135–145)

## 2011-03-06 LAB — SEDIMENTATION RATE: Sed Rate: 31 mm/hr — ABNORMAL HIGH (ref 0–22)

## 2011-03-06 LAB — C-REACTIVE PROTEIN: CRP: 7 mg/dL — ABNORMAL HIGH (ref <0.6)

## 2011-03-12 ENCOUNTER — Other Ambulatory Visit: Payer: Self-pay | Admitting: Gastroenterology

## 2011-03-16 ENCOUNTER — Ambulatory Visit (INDEPENDENT_AMBULATORY_CARE_PROVIDER_SITE_OTHER): Payer: Medicare Other | Admitting: *Deleted

## 2011-03-16 DIAGNOSIS — E538 Deficiency of other specified B group vitamins: Secondary | ICD-10-CM

## 2011-03-16 MED ORDER — CYANOCOBALAMIN 1000 MCG/ML IJ SOLN
1000.0000 ug | Freq: Once | INTRAMUSCULAR | Status: AC
  Administered 2011-03-16: 1000 ug via INTRAMUSCULAR

## 2011-03-17 ENCOUNTER — Other Ambulatory Visit: Payer: Self-pay

## 2011-03-17 NOTE — Telephone Encounter (Signed)
What is status of ref to ortho?

## 2011-03-18 LAB — CBC
Hemoglobin: 10.2 — ABNORMAL LOW
MCHC: 33.5
MCV: 88
RBC: 3.47 — ABNORMAL LOW

## 2011-03-19 LAB — CBC
HCT: 30.3 — ABNORMAL LOW
Hemoglobin: 10 — ABNORMAL LOW
Hemoglobin: 10.3 — ABNORMAL LOW
MCV: 88.9
Platelets: 314
RBC: 3.5 — ABNORMAL LOW
WBC: 4.5
WBC: 4.8

## 2011-03-19 LAB — DIFFERENTIAL
Basophils Absolute: 0
Basophils Relative: 0
Lymphocytes Relative: 42
Neutro Abs: 2.3
Neutrophils Relative %: 49

## 2011-03-19 LAB — COMPREHENSIVE METABOLIC PANEL
Albumin: 3.3 — ABNORMAL LOW
BUN: 6
CO2: 24
Chloride: 107
Creatinine, Ser: 0.72
GFR calc non Af Amer: >60
Glucose, Bld: 83
Total Bilirubin: 1

## 2011-03-19 LAB — URINALYSIS, ROUTINE W REFLEX MICROSCOPIC
Glucose, UA: NEGATIVE
Hgb urine dipstick: NEGATIVE
Protein, ur: NEGATIVE

## 2011-03-19 LAB — POTASSIUM: Potassium: 3.6

## 2011-03-19 NOTE — Telephone Encounter (Signed)
Pt left message again requesting refill of pain medication

## 2011-03-19 NOTE — Telephone Encounter (Signed)
i need the report from that ov

## 2011-03-19 NOTE — Telephone Encounter (Signed)
Did pt go for the appointment?

## 2011-03-19 NOTE — Telephone Encounter (Signed)
Office note requested from Dr. Blenda Bridegroom office-note placed on MD's desk to review

## 2011-03-19 NOTE — Telephone Encounter (Signed)
Pt advised that OV notes will be requested

## 2011-03-20 ENCOUNTER — Other Ambulatory Visit: Payer: Self-pay | Admitting: *Deleted

## 2011-03-20 MED ORDER — OXYCODONE-ACETAMINOPHEN 10-325 MG PO TABS: ORAL_TABLET | ORAL | Status: DC

## 2011-03-20 MED ORDER — PROMETHAZINE HCL 25 MG PO TABS: ORAL_TABLET | ORAL | Status: DC

## 2011-03-20 NOTE — Telephone Encounter (Signed)
i have refilled x 1 No further refill until you follow-up with ortho

## 2011-03-20 NOTE — Telephone Encounter (Signed)
Pt informed-rx placed upfront in cabinet for pickup.

## 2011-03-31 ENCOUNTER — Ambulatory Visit (INDEPENDENT_AMBULATORY_CARE_PROVIDER_SITE_OTHER): Payer: Medicare Other | Admitting: Cardiovascular Disease

## 2011-03-31 ENCOUNTER — Encounter: Payer: Self-pay | Admitting: Cardiovascular Disease

## 2011-03-31 ENCOUNTER — Other Ambulatory Visit: Payer: Self-pay | Admitting: *Deleted

## 2011-03-31 DIAGNOSIS — Z01810 Encounter for preprocedural cardiovascular examination: Secondary | ICD-10-CM

## 2011-03-31 DIAGNOSIS — R0789 Other chest pain: Secondary | ICD-10-CM | POA: Insufficient documentation

## 2011-03-31 DIAGNOSIS — R079 Chest pain, unspecified: Secondary | ICD-10-CM | POA: Insufficient documentation

## 2011-03-31 LAB — BASIC METABOLIC PANEL
BUN: 11 mg/dL (ref 6–23)
Chloride: 104 mEq/L (ref 96–112)
Glucose, Bld: 85 mg/dL (ref 70–99)
Potassium: 4.1 mEq/L (ref 3.5–5.3)
Sodium: 137 mEq/L (ref 135–145)

## 2011-03-31 MED ORDER — NITROGLYCERIN 0.4 MG SL SUBL: 0.4000 mg | SUBLINGUAL_TABLET | SUBLINGUAL | Status: DC | PRN

## 2011-03-31 MED ORDER — PREDNISONE 20 MG PO TABS: ORAL_TABLET | ORAL | Status: DC

## 2011-03-31 MED ORDER — FAMOTIDINE 10 MG PO TABS: ORAL_TABLET | ORAL | Status: DC

## 2011-03-31 NOTE — Assessment & Plan Note (Signed)
Discussed low carb diet.  Target hemoglobin A1c is 6.5 or less.  Continue current medications.  

## 2011-03-31 NOTE — Assessment & Plan Note (Signed)
New onset.  Nitro to see if helps.  Lexiscan myovue.  CT given history of HTN and dilated aortic root.  Premedicate for dye allergy

## 2011-03-31 NOTE — Patient Instructions (Signed)
Your physician recommends that you schedule a follow-up appointment in: after testing  Your physician has requested that you have a lexiscan myoview. For further information please visit [email or web address] Please follow instruction sheet, as given.   cta of the chest with and without contrast for chest pain/rule out aortic dissection  Prednisone 3 tablets night before and 3 tablets morning of cta  pepcid ac 10mg   One tablet  Night before and one tablet morning of cta  Benadryl 25 mg take one tablet night before and one tablet morning of cta

## 2011-03-31 NOTE — Assessment & Plan Note (Signed)
Well controlled.  Continue current medications and low sodium Dash type diet.    

## 2011-03-31 NOTE — Progress Notes (Signed)
65 yo previously seen by Dr Gabriel Rung.  Multiple previous w/u's for chest pain.  Normal myovue [id number] and 2008.  Diet controlled DM.  Echo in 2006 showed dilated aortic root at 4.3 cm.  HTN under control.  Pain last month.  Atl least every other day.  Can last minutes or an hour or two.  Not releated to exertion.  Sharp and radiates to back No associated dyspnea, palpitations or syncope.  NO LE edema or history of DVT.  Has aspirin and dye allergy.  Compliant with meds.  Does not think she can walk on treadmill Last myovue was pharmocologic  ROS: Denies fever, malais, weight loss, blurry vision, decreased visual acuity, cough, sputum, SOB, hemoptysis, pleuritic pain, palpitaitons, heartburn, abdominal pain, melena, lower extremity edema, claudication, or rash.  All other systems reviewed and negative   General: Affect appropriate Healthy:  appears stated age HEENT: normal Neck supple with no adenopathy JVP normal no bruits no thyromegaly Lungs clear with no wheezing and good diaphragmatic motion Heart:  S1/S2 no murmur,rub, gallop or click PMI normal Abdomen: benighn, BS positve, no tenderness, no AAA no bruit.  No HSM or HJR Distal pulses intact with no bruits No edema Neuro non-focal Skin warm and dry No muscular weakness  Medications Current Outpatient Prescriptions  Medication Sig Dispense Refill  . Calcium Carbonate-Vitamin D (CALCIUM-VITAMIN D) 600-200 MG-UNIT CAPS Take 1 capsule by mouth daily.        . cholestyramine (QUESTRAN) 4 G packet Questran 4grams in non carbonated liquid daily by mouth- mix in a non carbonated liquid and take midmorning 2 hours apart from other meds  60 each  11  . cyanocobalamin (,VITAMIN B-12,) 1000 MCG/ML injection Inject 1,000 mcg into the muscle every 30 (thirty) days.        Marland Kitchen levothyroxine (SYNTHROID, LEVOTHROID) 125 MCG tablet 1 TAB ONCE DAILY  30 tablet  5  . lovastatin (MEVACOR) 20 MG tablet TAKE 1 TABLET EVERY DAY  30 tablet  5  . magnesium  chloride (SLOW-MAG) 64 MG TBEC Take 2 tablets by mouth daily.        Marland Kitchen oxyCODONE-acetaminophen (PERCOCET) 10-325 MG per tablet One tablet by mouth every 6-8 hours as needed  100 tablet  0  . potassium chloride (KLOR-CON) 20 MEQ packet Take 20 mEq by mouth 3 (three) times daily.        . promethazine (PHENERGAN) 25 MG tablet TAKE 1 TABLET BY MOUTH EVERY 6 HOURS AS NEEDED FOR NAUSEA  30 tablet  1  . thiamine 100 MG tablet Take 100 mg by mouth daily.          Allergies Aspirin; Morphine; and Iodine  Family History: Family History  Problem Relation Age of Onset  . Esophageal cancer Son     deceased  . Cancer Son     Esophageal Cancer  . Diabetes Mother   . Heart disease Mother   . Colon cancer Paternal Uncle   . Cancer Paternal Uncle     Colon Cancer  . Kidney disease Sister   . Diabetes Father   . Kidney disease Brother   . Kidney disease Brother   . Kidney disease Brother     Social History: History   Social History  . Marital Status: Married    Spouse Name: N/A    Number of Children: N/A  . Years of Education: N/A   Occupational History  . Not on file.   Social History Main Topics  . Smoking status:  Former Smoker  . Smokeless tobacco: Not on file  . Alcohol Use: No  . Drug Use: No  . Sexually Active: Not on file   Other Topics Concern  . Not on file   Social History Narrative   Pt gets regular exercise    Electrocardiogram:  NSR normal ECG  Assessment and Plan

## 2011-04-01 ENCOUNTER — Other Ambulatory Visit: Payer: Medicare Other

## 2011-04-07 ENCOUNTER — Other Ambulatory Visit: Payer: Self-pay | Admitting: Endocrinology

## 2011-04-08 ENCOUNTER — Ambulatory Visit (HOSPITAL_BASED_OUTPATIENT_CLINIC_OR_DEPARTMENT_OTHER): Payer: Medicare Other | Admitting: Radiology

## 2011-04-08 ENCOUNTER — Ambulatory Visit (HOSPITAL_COMMUNITY): Payer: Medicare Other | Attending: Cardiovascular Disease | Admitting: Radiology

## 2011-04-08 DIAGNOSIS — R0602 Shortness of breath: Secondary | ICD-10-CM

## 2011-04-08 DIAGNOSIS — E785 Hyperlipidemia, unspecified: Secondary | ICD-10-CM | POA: Insufficient documentation

## 2011-04-08 DIAGNOSIS — R072 Precordial pain: Secondary | ICD-10-CM

## 2011-04-08 DIAGNOSIS — I10 Essential (primary) hypertension: Secondary | ICD-10-CM | POA: Insufficient documentation

## 2011-04-08 DIAGNOSIS — E119 Type 2 diabetes mellitus without complications: Secondary | ICD-10-CM | POA: Insufficient documentation

## 2011-04-08 DIAGNOSIS — Z87891 Personal history of nicotine dependence: Secondary | ICD-10-CM | POA: Insufficient documentation

## 2011-04-08 DIAGNOSIS — Z8249 Family history of ischemic heart disease and other diseases of the circulatory system: Secondary | ICD-10-CM | POA: Insufficient documentation

## 2011-04-08 DIAGNOSIS — R079 Chest pain, unspecified: Secondary | ICD-10-CM | POA: Insufficient documentation

## 2011-04-08 MED ORDER — REGADENOSON 0.4 MG/5ML IV SOLN
0.4000 mg | Freq: Once | INTRAVENOUS | Status: AC
  Administered 2011-04-08: 0.4 mg via INTRAVENOUS

## 2011-04-08 MED ORDER — TECHNETIUM TC 99M TETROFOSMIN IV KIT
11.0000 | PACK | Freq: Once | INTRAVENOUS | Status: AC | PRN
  Administered 2011-04-08: 11 via INTRAVENOUS

## 2011-04-08 MED ORDER — TECHNETIUM TC 99M TETROFOSMIN IV KIT
33.0000 | PACK | Freq: Once | INTRAVENOUS | Status: AC | PRN
  Administered 2011-04-08: 33 via INTRAVENOUS

## 2011-04-08 NOTE — Progress Notes (Signed)
[facility] SITE 3 NUCLEAR MED [address] [place] [address] [phone]  Cardiology Nuclear Med Study  [name] is a 65 y.o. female [id number] 1945-08-04   Nuclear Med Background Indication for Stress Test:  Evaluation for Ischemia History: '06 Echo: EF 55-65% and '06 Myocardial Perfusion Study: (-) ischemia Cardiac Risk Factors: Family History - CAD, History of Smoking, Hypertension, Lipids and NIDDM  Symptoms:  Chest Pain   Nuclear Pre-Procedure Caffeine/Decaff Intake:  None NPO After: 5 pm   Lungs: clear IV 0.9% NS with Angio Cath:  24g  IV Site: R Hand  IV Started by:  Bonnita Levan, RN  Chest Size (in):  38 Cup Size: DD  Height: 5\' 5"  (1.651 m)  Weight:  220 lb (99.791 kg)  BMI:  Body mass index is 36.61 kg/(m^2). Tech Comments:  N/A    Nuclear Med Study 1 or 2 day study: 1 day  Stress Test Type:  Lexiscan  Reading MD: Charlton Haws, MD  Order Authorizing Provider:  Dr. Charlton Haws  Resting Radionuclide: Technetium 18m Tetrofosmin  Resting Radionuclide Dose: 10.9 mCi   Stress Radionuclide:  Technetium 67m Tetrofosmin  Stress Radionuclide Dose: 33 mCi           Stress Protocol Rest HR: 67 Stress HR: 87  Rest BP: 114/76 Stress BP: 111/70  Exercise Time (min): n/a METS: n/a   Predicted Max HR: 153 bpm % Max HR: 56.86 bpm Rate Pressure Product: 9918   Dose of Adenosine (mg):  n/a Dose of Lexiscan: 0.4 mg  Dose of Atropine (mg): n/a Dose of Dobutamine: n/a mcg/kg/min (at max HR)  Stress Test Technologist: Milana Na, EMT-P  Nuclear Technologist:  Domenic Polite, CNMT     Rest Procedure:  Myocardial perfusion imaging was performed at rest 45 minutes following the intravenous administration of Technetium 34m Tetrofosmin. Rest ECG: NSR  Stress Procedure:  The patient received IV Lexiscan 0.4 mg over 15-seconds.  Technetium 64m Tetrofosmin injected at 30-seconds.  There were no significant changes and sob with Lexiscan.   Quantitative spect images were obtained after a 45 minute delay. Stress ECG: No significant change from baseline ECG  QPS Raw Data Images:  Normal; no motion artifact; normal heart/lung ratio. Stress Images:  Normal homogeneous uptake in all areas of the myocardium. Rest Images:  Normal homogeneous uptake in all areas of the myocardium. Subtraction (SDS):  Normal Transient Ischemic Dilatation (Normal <1.22):  .91 Lung/Heart Ratio (Normal <0.45):  .33  Quantitative Gated Spect Images QGS EDV:  70 ml QGS ESV:  27 ml QGS cine images:  NL LV Function; NL Wall Motion QGS EF: 61%  Impression Exercise Capacity:  Lexiscan with no exercise. BP Response:  Normal blood pressure response. Clinical Symptoms:  No chest pain. ECG Impression:  No significant ST segment change suggestive of ischemia. Comparison with Prior Nuclear Study: No images to compare  Overall Impression:  Normal stress nuclear study.  Activity on left breast on RAW stress images likely contaminant as not seen on rest images       [employer]

## 2011-04-15 ENCOUNTER — Ambulatory Visit (INDEPENDENT_AMBULATORY_CARE_PROVIDER_SITE_OTHER): Payer: Medicare Other | Admitting: *Deleted

## 2011-04-15 DIAGNOSIS — Z23 Encounter for immunization: Secondary | ICD-10-CM

## 2011-04-15 DIAGNOSIS — E538 Deficiency of other specified B group vitamins: Secondary | ICD-10-CM

## 2011-04-15 MED ORDER — CYANOCOBALAMIN 1000 MCG/ML IJ SOLN
1000.0000 ug | Freq: Once | INTRAMUSCULAR | Status: AC
  Administered 2011-04-15: 1000 ug via INTRAMUSCULAR

## 2011-04-16 ENCOUNTER — Encounter: Payer: Self-pay | Admitting: Cardiovascular Disease

## 2011-04-16 ENCOUNTER — Ambulatory Visit (INDEPENDENT_AMBULATORY_CARE_PROVIDER_SITE_OTHER): Payer: Medicare Other | Admitting: Cardiovascular Disease

## 2011-04-16 DIAGNOSIS — R079 Chest pain, unspecified: Secondary | ICD-10-CM

## 2011-04-16 NOTE — Patient Instructions (Signed)
Your physician wants you to follow-up in: YEAR WITH DR [facility] reminder letter in the mail two months in advance. If you don't receive a letter, please call our office to schedule the follow-up appointment.  Your physician recommends that you continue on your current medications as directed. Please refer to the Current Medication list given to you today.  Non-Cardiac CT Angiography (CTA), is a special type of CT scan that uses a computer to produce multi-dimensional views of major blood vessels throughout the body. In CT angiography, a contrast material is injected through an IV to help visualize the blood vessels DO TOMORROW AT [place]  PT HAS DYE ALLERGY  DX CHEST PAIN

## 2011-04-16 NOTE — Progress Notes (Signed)
65 yo previously seen by Dr Gabriel Rung. Multiple previous w/u's for chest pain. Normal myovue [id number] and 2008. Diet controlled DM. Echo in 2006 showed dilated aortic root at 4.3 cm. HTN under control. Pain last month. Atl least every other day. Can last minutes or an hour or two. Not releated to exertion. Sharp and radiates to back No associated dyspnea, palpitations or syncope. NO LE edema or history of DVT. Has aspirin and dye allergy. Compliant with meds. Does not think she can walk on treadmill Last myovue was pharmocologic  Reviewed Myovue:  normal Reviewed Echo normal Aortic root 4.5 cm  Patient took premeds for myovue and echo did not get CT yet.  Atypical sharp SSCP appears to be noncardiac  ROS: Denies fever, malais, weight loss, blurry vision, decreased visual acuity, cough, sputum, SOB, hemoptysis, pleuritic pain, palpitaitons, heartburn, abdominal pain, melena, lower extremity edema, claudication, or rash.  All other systems reviewed and negative  General: Affect appropriate Healthy:  appears stated age HEENT: normal Neck supple with no adenopathy JVP normal no bruits no thyromegaly Lungs clear with no wheezing and good diaphragmatic motion Heart:  S1/S2 no murmur,rub, gallop or click PMI normal Abdomen: benighn, BS positve, no tenderness, no AAA no bruit.  No HSM or HJR Distal pulses intact with no bruits No edema Neuro non-focal Skin warm and dry No muscular weakness   Current Outpatient Prescriptions  Medication Sig Dispense Refill  . Calcium Carbonate-Vitamin D (CALCIUM-VITAMIN D) 600-200 MG-UNIT CAPS Take 1 capsule by mouth daily.        . cholestyramine (QUESTRAN) 4 G packet Questran 4grams in non carbonated liquid daily by mouth- mix in a non carbonated liquid and take midmorning 2 hours apart from other meds  60 each  11  . cyanocobalamin (,VITAMIN B-12,) 1000 MCG/ML injection Inject 1,000 mcg into the muscle every 30 (thirty) days.        . famotidine (PEPCID AC) 10  MG tablet Take one tablet night before and one tablet morning of procedure  2 tablet  0  . levothyroxine (SYNTHROID, LEVOTHROID) 125 MCG tablet 1 TAB ONCE DAILY  30 tablet  5  . lovastatin (MEVACOR) 20 MG tablet TAKE 1 TABLET EVERY DAY  30 tablet  5  . magnesium chloride (SLOW-MAG) 64 MG TBEC Take 2 tablets by mouth daily.        . nitroGLYCERIN (NITROSTAT) 0.4 MG SL tablet Place 1 tablet (0.4 mg total) under the tongue every 5 (five) minutes as needed for chest pain.  25 tablet  12  . oxyCODONE-acetaminophen (PERCOCET) 10-325 MG per tablet One tablet by mouth every 6-8 hours as needed  100 tablet  0  . potassium chloride (KLOR-CON) 20 MEQ packet Take 20 mEq by mouth 3 (three) times daily.        . promethazine (PHENERGAN) 25 MG tablet TAKE 1 TABLET BY MOUTH EVERY 6 HOURS AS NEEDED FOR NAUSEA  30 tablet  1  . thiamine 100 MG tablet Take 100 mg by mouth daily.        . trazodone (DESYREL) 300 MG tablet As directed       Current Facility-Administered Medications  Medication Dose Route Frequency Provider Last Rate Last Dose  . cyanocobalamin ((VITAMIN B-12)) injection 1,000 mcg  1,000 mcg Intramuscular Once [name], MD   1,000 mcg at 04/15/11 1624    Allergies  Aspirin; Morphine; and Iodine  Electrocardiogram:  Assessment and Plan

## 2011-04-16 NOTE — Assessment & Plan Note (Signed)
Well controlled.  Continue current medications and low sodium Dash type diet.    

## 2011-04-16 NOTE — Assessment & Plan Note (Signed)
Cholesterol is at goal.  Continue current dose of statin and diet Rx.  No myalgias or side effects.  F/U  LFT's in 6 months. Lab Results  Component Value Date   LDLCALC 56 05/15/2010

## 2011-04-16 NOTE — Assessment & Plan Note (Signed)
Noncardiac.  Complete w.u with CTA to size aorta.

## 2011-04-17 ENCOUNTER — Ambulatory Visit (INDEPENDENT_AMBULATORY_CARE_PROVIDER_SITE_OTHER)
Admission: RE | Admit: 2011-04-17 | Discharge: 2011-04-17 | Disposition: A | Discharge status: Home / Self Care | Payer: Medicare Other | Source: Ambulatory Visit | Attending: Cardiovascular Disease | Admitting: Cardiovascular Disease

## 2011-04-17 DIAGNOSIS — R079 Chest pain, unspecified: Secondary | ICD-10-CM

## 2011-04-17 MED ORDER — IOHEXOL 300 MG/ML  SOLN
80.0000 mL | Freq: Once | INTRAMUSCULAR | Status: AC | PRN
  Administered 2011-04-17: 80 mL via INTRAVENOUS

## 2011-04-27 ENCOUNTER — Ambulatory Visit (INDEPENDENT_AMBULATORY_CARE_PROVIDER_SITE_OTHER): Payer: Medicare Other | Admitting: Endocrinology

## 2011-04-27 ENCOUNTER — Encounter: Payer: Self-pay | Admitting: Endocrinology

## 2011-04-27 DIAGNOSIS — K219 Gastro-esophageal reflux disease without esophagitis: Secondary | ICD-10-CM

## 2011-04-27 MED ORDER — AZITHROMYCIN 500 MG PO TABS: 500.0000 mg | ORAL_TABLET | Freq: Every day | ORAL | Status: AC

## 2011-04-27 MED ORDER — OMEPRAZOLE 40 MG PO CPDR: 40.0000 mg | DELAYED_RELEASE_CAPSULE | Freq: Every day | ORAL | Status: DC

## 2011-04-27 MED ORDER — OXYCODONE-ACETAMINOPHEN 10-325 MG PO TABS: ORAL_TABLET | ORAL | Status: DC

## 2011-04-27 NOTE — Progress Notes (Signed)
Subjective:    Patient ID: [name], female    DOB: [birth year], 65 y.o.   MRN: [id number]  HPI Pt states 2 days of slight pain at the left ear, and assoc diarrhea. She has chronic gerd.  pepcid helps.  She stopped aciphex, due to cost. She says she has seen a pain specialist, but that there is nothing they can do for her.   Past Medical History  Diagnosis Date  . Obesity   . PUD (peptic ulcer disease)   . Anemia   . Leukopenia   . Abdominal pain   . Short bowel syndrome   . Internal hemorrhoids without mention of complication   . Stricture and stenosis of esophagus   . Hiatal hernia   . Osteoarthrosis, unspecified whether generalized or localized, unspecified site   . Thyrotoxicosis without mention of goiter or other cause, without mention of thyrotoxic crisis or storm   . Unspecified essential hypertension   . Other and unspecified hyperlipidemia   . Gout, unspecified   . Diverticulosis of colon (without mention of hemorrhage)   . Type II or unspecified type diabetes mellitus without mention of complication, not stated as uncontrolled   . C. difficile colitis   . VITAMIN B12 DEFICIENCY 08/30/2009  . GOITER, MULTINODULAR 04/02/2009  . HYPOTHYROIDISM, POST-RADIATION 08/13/2009  . ANEMIA, IRON DEFICIENCY 05/08/2009  . ASYMPTOMATIC POSTMENOPAUSAL STATUS 10/11/2008  . Esophageal reflux 06/12/2008    Past Surgical History  Procedure Date  . Lysis adhension     multiple  . Cholecystectomy   . Vein ligation and stripping     Right leg  . Small intestine surgery     multiple  . Abdominal hysterectomy     with BSO  . Lithotripsy   . Bunionectomy     bilateral  . Colonoscopy   . Esophagogastroduodenoscopy 07/15/2005  . Thyroid ultrasound 12/1994 and 12/1995    History   Social History  . Marital Status: Married    Spouse Name: N/A    Number of Children: N/A  . Years of Education: N/A   Occupational History  . Not on file.   Social History Main Topics  .  Smoking status: Former Games developer  . Smokeless tobacco: Not on file  . Alcohol Use: No  . Drug Use: No  . Sexually Active: Not on file   Other Topics Concern  . Not on file   Social History Narrative   Pt gets regular exercise    Current Outpatient Prescriptions on File Prior to Visit  Medication Sig Dispense Refill  . Calcium Carbonate-Vitamin D (CALCIUM-VITAMIN D) 600-200 MG-UNIT CAPS Take 1 capsule by mouth daily.        . cholestyramine (QUESTRAN) 4 G packet Questran 4grams in non carbonated liquid daily by mouth- mix in a non carbonated liquid and take midmorning 2 hours apart from other meds  60 each  11  . cyanocobalamin (,VITAMIN B-12,) 1000 MCG/ML injection Inject 1,000 mcg into the muscle every 30 (thirty) days.        Marland Kitchen levothyroxine (SYNTHROID, LEVOTHROID) 125 MCG tablet 1 TAB ONCE DAILY  30 tablet  5  . lovastatin (MEVACOR) 20 MG tablet TAKE 1 TABLET EVERY DAY  30 tablet  5  . magnesium chloride (SLOW-MAG) 64 MG TBEC Take 2 tablets by mouth daily.        . nitroGLYCERIN (NITROSTAT) 0.4 MG SL tablet Place 1 tablet (0.4 mg total) under the tongue every 5 (five) minutes as needed for chest  pain.  25 tablet  12  . potassium chloride (KLOR-CON) 20 MEQ packet Take 20 mEq by mouth 3 (three) times daily.        . promethazine (PHENERGAN) 25 MG tablet TAKE 1 TABLET BY MOUTH EVERY 6 HOURS AS NEEDED FOR NAUSEA  30 tablet  1  . thiamine 100 MG tablet Take 100 mg by mouth daily.        . trazodone (DESYREL) 300 MG tablet As directed      . famotidine (PEPCID AC) 10 MG tablet Take one tablet night before and one tablet morning of procedure  2 tablet  0    Allergies  Allergen Reactions  . Aspirin     REACTION: Gi Intolerance/ Burning in stomach  . Morphine     REACTION: itching  . Iodine Rash    Allergic to IVP dye    Family History  Problem Relation Age of Onset  . Esophageal cancer Son     deceased  . Cancer Son     Esophageal Cancer  . Diabetes Mother   . Heart disease  Mother   . Colon cancer Paternal Uncle   . Cancer Paternal Uncle     Colon Cancer  . Kidney disease Sister   . Diabetes Father   . Kidney disease Brother   . Kidney disease Brother   . Kidney disease Brother     BP 112/76  Pulse 76  Temp(Src) 97.9 F (36.6 C) (Oral)  Ht 5\' 5"  (1.651 m)  Wt 218 lb 8 oz (99.111 kg)  BMI 36.36 kg/m2  SpO2 98%    Review of Systems Denies fever, but she has hoarseness.      Objective:   Physical Exam VITAL SIGNS:  See vs page GENERAL: no distress.  Obese head: no deformity eyes: no periorbital swelling, no proptosis external nose and ears are normal mouth: no lesion seen Both tm's are congested, but not red.     Assessment & Plan:  URI, new Diarrhea, prob viral GERD, recurrent after discontinuation of aciphex Chronic pain syndrome

## 2011-04-27 NOTE — Patient Instructions (Addendum)
Please schedule a regular physical. Here is a refill of your percocet. i have sent a prescription to your pharmacy, for an antibiotic, and for heartburn.

## 2011-04-29 DIAGNOSIS — K219 Gastro-esophageal reflux disease without esophagitis: Secondary | ICD-10-CM | POA: Insufficient documentation

## 2011-05-13 ENCOUNTER — Telehealth: Payer: Self-pay | Admitting: *Deleted

## 2011-05-13 DIAGNOSIS — Z0000 Encounter for general adult medical examination without abnormal findings: Secondary | ICD-10-CM

## 2011-05-13 DIAGNOSIS — E119 Type 2 diabetes mellitus without complications: Secondary | ICD-10-CM

## 2011-05-13 NOTE — Telephone Encounter (Signed)
Message copied by Carin Primrose on Wed May 13, 2011  1:06 PM ------      Message from: Etheleen Sia      Created: Mon May 11, 2011  9:31 AM      Regarding: PHYSICAL LABS       PT HAS [employer] MEDICARE COMPLETE. WANTS TO DO CPX LABS PRIOR TO JAN 3 PHYSICAL

## 2011-05-13 NOTE — Telephone Encounter (Signed)
Labs placed into Epic for upcoming CPX appointment.  

## 2011-05-15 ENCOUNTER — Ambulatory Visit: Payer: Medicare Other | Admitting: *Deleted

## 2011-05-15 DIAGNOSIS — E538 Deficiency of other specified B group vitamins: Secondary | ICD-10-CM

## 2011-05-15 MED ORDER — CYANOCOBALAMIN 1000 MCG/ML IJ SOLN
1000.0000 ug | Freq: Once | INTRAMUSCULAR | Status: AC
  Administered 2011-05-15: 1000 ug via INTRAMUSCULAR

## 2011-05-29 ENCOUNTER — Telehealth: Payer: Self-pay

## 2011-05-29 NOTE — Telephone Encounter (Signed)
Patient called [facility] requesting pain medication refill. I returned call to patient who d/c call, will try again later.

## 2011-05-29 NOTE — Telephone Encounter (Signed)
Rx for Oxy last filled 04/27/11, please advise if ok to refill on this [facility] patient. Thanks

## 2011-06-01 MED ORDER — OXYCODONE-ACETAMINOPHEN 10-325 MG PO TABS: ORAL_TABLET | ORAL | Status: DC

## 2011-06-01 NOTE — Telephone Encounter (Signed)
Pt informed, Rx in cabinet for pt pick up  

## 2011-06-01 NOTE — Telephone Encounter (Signed)
Done hardcopy to robin  

## 2011-06-02 DIAGNOSIS — Z923 Personal history of irradiation: Secondary | ICD-10-CM

## 2011-06-02 DIAGNOSIS — Z9221 Personal history of antineoplastic chemotherapy: Secondary | ICD-10-CM

## 2011-06-02 HISTORY — DX: Personal history of irradiation: Z92.3

## 2011-06-02 HISTORY — DX: Personal history of antineoplastic chemotherapy: Z92.21

## 2011-06-04 ENCOUNTER — Encounter: Payer: Medicare Other | Admitting: Endocrinology

## 2011-06-08 ENCOUNTER — Other Ambulatory Visit: Payer: Self-pay | Admitting: Endocrinology

## 2011-06-10 ENCOUNTER — Telehealth: Payer: Self-pay | Admitting: Cardiovascular Disease

## 2011-06-10 NOTE — Telephone Encounter (Signed)
LOv,Stress,Echo,12 faxed to [facility] @ [phone] 06/10/11/KM

## 2011-06-24 ENCOUNTER — Other Ambulatory Visit (INDEPENDENT_AMBULATORY_CARE_PROVIDER_SITE_OTHER): Payer: Medicare Other

## 2011-06-24 DIAGNOSIS — E119 Type 2 diabetes mellitus without complications: Secondary | ICD-10-CM

## 2011-06-24 DIAGNOSIS — Z0000 Encounter for general adult medical examination without abnormal findings: Secondary | ICD-10-CM

## 2011-06-24 DIAGNOSIS — M25569 Pain in unspecified knee: Secondary | ICD-10-CM

## 2011-06-24 DIAGNOSIS — Z79899 Other long term (current) drug therapy: Secondary | ICD-10-CM

## 2011-06-24 DIAGNOSIS — E538 Deficiency of other specified B group vitamins: Secondary | ICD-10-CM

## 2011-06-24 LAB — URINALYSIS, ROUTINE W REFLEX MICROSCOPIC
Specific Gravity, Urine: 1.025 (ref 1.000–1.030)
Total Protein, Urine: NEGATIVE
Urine Glucose: NEGATIVE
pH: 6 (ref 5.0–8.0)

## 2011-06-24 LAB — TSH: TSH: 0.76 u[IU]/mL (ref 0.35–5.50)

## 2011-06-24 LAB — CBC WITH DIFFERENTIAL/PLATELET
Basophils Absolute: 0 10*3/uL (ref 0.0–0.1)
HCT: 36.7 % (ref 36.0–46.0)
Lymphs Abs: 1.8 10*3/uL (ref 0.7–4.0)
MCV: 89.7 fl (ref 78.0–100.0)
Monocytes Absolute: 0.4 10*3/uL (ref 0.1–1.0)
Monocytes Relative: 7.1 % (ref 3.0–12.0)
Neutrophils Relative %: 61.9 % (ref 43.0–77.0)
Platelets: 336 10*3/uL (ref 150.0–400.0)
RDW: 13.6 % (ref 11.5–14.6)

## 2011-06-24 LAB — HEPATIC FUNCTION PANEL
Albumin: 4.2 g/dL (ref 3.5–5.2)
Alkaline Phosphatase: 98 U/L (ref 39–117)
Total Protein: 7.9 g/dL (ref 6.0–8.3)

## 2011-06-24 LAB — BASIC METABOLIC PANEL
Calcium: 10 mg/dL (ref 8.4–10.5)
GFR: 81.31 mL/min (ref >60.00)
Sodium: 139 mEq/L (ref 135–145)

## 2011-06-24 LAB — HEMOGLOBIN A1C: Hgb A1c MFr Bld: 5.6 % (ref 4.6–6.5)

## 2011-06-24 LAB — LIPID PANEL: HDL: 71.1 mg/dL (ref >39.00)

## 2011-06-25 ENCOUNTER — Ambulatory Visit (INDEPENDENT_AMBULATORY_CARE_PROVIDER_SITE_OTHER): Payer: Medicare Other | Admitting: Endocrinology

## 2011-06-25 ENCOUNTER — Encounter: Payer: Self-pay | Admitting: Endocrinology

## 2011-06-25 VITALS — BP 112/88 | HR 93 | Temp 97.3°F | Ht 65.0 in | Wt 208.0 lb

## 2011-06-25 DIAGNOSIS — M25569 Pain in unspecified knee: Secondary | ICD-10-CM

## 2011-06-25 DIAGNOSIS — Z0000 Encounter for general adult medical examination without abnormal findings: Secondary | ICD-10-CM

## 2011-06-25 DIAGNOSIS — M25561 Pain in right knee: Secondary | ICD-10-CM

## 2011-06-25 DIAGNOSIS — E538 Deficiency of other specified B group vitamins: Secondary | ICD-10-CM

## 2011-06-25 MED ORDER — CYANOCOBALAMIN 1000 MCG/ML IJ SOLN
1000.0000 ug | Freq: Once | INTRAMUSCULAR | Status: AC
  Administered 2011-06-25: 1000 ug via INTRAMUSCULAR

## 2011-06-25 MED ORDER — OXYCODONE HCL 15 MG PO TABS: 15.0000 mg | ORAL_TABLET | ORAL | Status: DC | PRN

## 2011-06-25 NOTE — Patient Instructions (Addendum)
Unless you have urine symptoms, you do not need any treatment for the urine test. please consider these measures for your health:  minimize alcohol.  do not use tobacco products.  have a colonoscopy at least every 10 years from age 68.  Women should have an annual mammogram from age 60.  keep firearms safely stored.  always use seat belts.  have working smoke alarms in your home.  see an eye doctor and dentist regularly.  never drive under the influence of alcohol or drugs (including prescription drugs).   please let me know what your wishes would be, if artificial life support measures should become necessary.  it is critically important to prevent falling down (keep floor areas well-lit, dry, and free of loose objects.  If you have a cane, walker, or wheelchair, you should use it, even for short trips around the house.  Also, try not to rush).   Here is a prescription for a stronger oxycodone pill. (update: we discussed code status.  pt requests full code, but would not want to be started or maintained on artificial life-support measures if there was not a reasonable chance of recovery)

## 2011-06-25 NOTE — Progress Notes (Signed)
Subjective:    Patient ID: [name], female    DOB: [birth year], 66 y.o.   MRN: [id number]  HPI here for regular wellness examination.  she's feeling pretty well in general, and says chronic med probs are stable, except as noted below.   Past Medical History  Diagnosis Date  . Obesity   . PUD (peptic ulcer disease)   . Anemia   . Leukopenia   . Abdominal pain   . Short bowel syndrome   . Internal hemorrhoids without mention of complication   . Stricture and stenosis of esophagus   . Hiatal hernia   . Osteoarthrosis, unspecified whether generalized or localized, unspecified site   . Thyrotoxicosis without mention of goiter or other cause, without mention of thyrotoxic crisis or storm   . Unspecified essential hypertension   . Other and unspecified hyperlipidemia   . Gout, unspecified   . Diverticulosis of colon (without mention of hemorrhage)   . Type II or unspecified type diabetes mellitus without mention of complication, not stated as uncontrolled   . C. difficile colitis   . VITAMIN B12 DEFICIENCY 08/30/2009  . GOITER, MULTINODULAR 04/02/2009  . HYPOTHYROIDISM, POST-RADIATION 08/13/2009  . ANEMIA, IRON DEFICIENCY 05/08/2009  . ASYMPTOMATIC POSTMENOPAUSAL STATUS 10/11/2008  . Esophageal reflux 06/12/2008    Past Surgical History  Procedure Date  . Lysis adhension     multiple  . Cholecystectomy   . Vein ligation and stripping     Right leg  . Small intestine surgery     multiple  . Abdominal hysterectomy     with BSO  . Lithotripsy   . Bunionectomy     bilateral  . Colonoscopy   . Esophagogastroduodenoscopy 07/15/2005  . Thyroid ultrasound 12/1994 and 12/1995    History   Social History  . Marital Status: Married    Spouse Name: N/A    Number of Children: N/A  . Years of Education: N/A   Occupational History  . Not on file.   Social History Main Topics  . Smoking status: Former Games developer  . Smokeless tobacco: Not on file  . Alcohol Use: No  . Drug  Use: No  . Sexually Active: Not on file   Other Topics Concern  . Not on file   Social History Narrative   Pt gets regular exercise    Current Outpatient Prescriptions on File Prior to Visit  Medication Sig Dispense Refill  . Calcium Carbonate-Vitamin D (CALCIUM-VITAMIN D) 600-200 MG-UNIT CAPS Take 1 capsule by mouth daily.        . cyanocobalamin (,VITAMIN B-12,) 1000 MCG/ML injection Inject 1,000 mcg into the muscle every 30 (thirty) days.        Marland Kitchen levothyroxine (SYNTHROID, LEVOTHROID) 125 MCG tablet 1 TAB ONCE DAILY  30 tablet  5  . lovastatin (MEVACOR) 20 MG tablet TAKE 1 TABLET EVERY DAY  30 tablet  5  . magnesium chloride (SLOW-MAG) 64 MG TBEC Take 2 tablets by mouth daily.        Marland Kitchen omeprazole (PRILOSEC) 40 MG capsule Take 1 capsule (40 mg total) by mouth daily.  30 capsule  11  . promethazine (PHENERGAN) 25 MG tablet TAKE 1 TABLET BY MOUTH EVERY 6 HOURS AS NEEDED FOR NAUSEA  30 tablet  1  . thiamine 100 MG tablet Take 100 mg by mouth daily.        . trazodone (DESYREL) 300 MG tablet As directed      . cholestyramine (QUESTRAN) 4 G packet Questran  4grams in non carbonated liquid daily by mouth- mix in a non carbonated liquid and take midmorning 2 hours apart from other meds  60 each  11  . nitroGLYCERIN (NITROSTAT) 0.4 MG SL tablet Place 1 tablet (0.4 mg total) under the tongue every 5 (five) minutes as needed for chest pain.  25 tablet  12  . potassium chloride (KLOR-CON) 20 MEQ packet Take 20 mEq by mouth 3 (three) times daily.          Allergies  Allergen Reactions  . Aspirin     REACTION: Gi Intolerance/ Burning in stomach  . Morphine     REACTION: itching  . Iodine Rash    Allergic to IVP dye    Family History  Problem Relation Age of Onset  . Esophageal cancer Son     deceased  . Cancer Son     Esophageal Cancer  . Diabetes Mother   . Heart disease Mother   . Colon cancer Paternal Uncle   . Cancer Paternal Uncle     Colon Cancer  . Kidney disease Sister   .  Diabetes Father   . Kidney disease Brother   . Kidney disease Brother   . Kidney disease Brother     BP 112/88  Pulse 93  Temp(Src) 97.3 F (36.3 C) (Oral)  Ht 5\' 5"  (1.651 m)  Wt 208 lb (94.348 kg)  BMI 34.61 kg/m2  SpO2 98%     Review of Systems  Constitutional: Negative for unexpected weight change.  HENT: Negative for hearing loss.   Eyes: Negative for visual disturbance.  Respiratory: Negative for shortness of breath.   Cardiovascular: Negative for chest pain.  Gastrointestinal: Negative for anal bleeding.  Genitourinary: Negative for hematuria.  Musculoskeletal: Positive for arthralgias.  Skin: Negative for rash.  Neurological: Negative for syncope and headaches.  Hematological: Does not bruise/bleed easily.  Psychiatric/Behavioral: Negative for dysphoric mood.       Objective:   Physical Exam VS: see vs page GEN: no distress HEAD: head: no deformity eyes: no periorbital swelling, no proptosis external nose and ears are normal mouth: no lesion seen NECK: small multinodular goiter.  CHEST WALL: no deformity LUNGS:  Clear to auscultation BREASTS:  declined CV: reg rate and rhythm, no murmur ABD: abdomen is soft, nontender.  no hepatosplenomegaly.  not distended.  no hernia GENITALIA/RECTAL: declined MUSCULOSKELETAL: muscle bulk and strength are grossly normal.  no obvious joint swelling.  gait is normal and steady EXTEMITIES: no deformity.  no ulcer on the feet.  feet are of normal color and temp.  no edema on the left leg, but 1+ on the right.  There are bilat healed (bunionectomy) scars.  PULSES: dorsalis pedis intact bilat.  no carotid bruit.   NEURO:  cn 2-12 grossly intact.   readily moves all 4's.  sensation is intact to touch on the feet SKIN:  Normal texture and temperature.  No rash or suspicious lesion is visible.   NODES:  None palpable at the neck PSYCH: alert, oriented x3.  Does not appear anxious nor depressed.     Assessment & Plan:    Wellness visit today, with problems stable, except as noted.   SEPARATE EVALUATION FOLLOWS--EACH PROBLEM HERE IS NEW, NOT RESPONDING TO TREATMENT, OR POSES SIGNIFICANT RISK TO THE PATIENT'S HEALTH: HISTORY OF THE PRESENT ILLNESS: Pt states few years of severe pain at the right knee, and assoc numbness.  She saw dr Eulah Pont, who advised TKR.  She says she needs to  take percocet 6/day.   PAST MEDICAL HISTORY reviewed and up to date today REVIEW OF SYSTEMS: Denies urinary sxs and fever PHYSICAL EXAMINATION: VITAL SIGNS:  See vs page GENERAL: no distress Right knee:  Healing surgical scars (recent arthroscopy).  No tend/erythema/warmth.  There is slight swelling) LAB/XRAY RESULTS: (i reviewed ua results) IMPRESSION: Chronic pain syndrome, due to OA Abnormal ua, asymptomatic PLAN: See instruction page

## 2011-06-28 DIAGNOSIS — Z0000 Encounter for general adult medical examination without abnormal findings: Secondary | ICD-10-CM | POA: Insufficient documentation

## 2011-06-28 DIAGNOSIS — M25561 Pain in right knee: Secondary | ICD-10-CM | POA: Insufficient documentation

## 2011-07-02 ENCOUNTER — Other Ambulatory Visit: Payer: Self-pay | Admitting: Endocrinology

## 2011-07-02 DIAGNOSIS — Z1231 Encounter for screening mammogram for malignant neoplasm of breast: Secondary | ICD-10-CM

## 2011-07-13 ENCOUNTER — Emergency Department (HOSPITAL_COMMUNITY): Payer: Medicare Other

## 2011-07-13 ENCOUNTER — Telehealth: Payer: Self-pay | Admitting: Gastroenterology

## 2011-07-13 ENCOUNTER — Encounter (HOSPITAL_COMMUNITY): Payer: Self-pay | Admitting: *Deleted

## 2011-07-13 ENCOUNTER — Emergency Department (HOSPITAL_COMMUNITY)
Admission: EM | Admit: 2011-07-13 | Discharge: 2011-07-14 | Disposition: A | Discharge status: Home / Self Care | Payer: Medicare Other | Attending: Emergency Medicine | Admitting: Emergency Medicine

## 2011-07-13 DIAGNOSIS — R1032 Left lower quadrant pain: Secondary | ICD-10-CM | POA: Insufficient documentation

## 2011-07-13 DIAGNOSIS — E039 Hypothyroidism, unspecified: Secondary | ICD-10-CM | POA: Insufficient documentation

## 2011-07-13 DIAGNOSIS — I517 Cardiomegaly: Secondary | ICD-10-CM | POA: Insufficient documentation

## 2011-07-13 DIAGNOSIS — R10814 Left lower quadrant abdominal tenderness: Secondary | ICD-10-CM | POA: Insufficient documentation

## 2011-07-13 DIAGNOSIS — N390 Urinary tract infection, site not specified: Secondary | ICD-10-CM | POA: Insufficient documentation

## 2011-07-13 DIAGNOSIS — R197 Diarrhea, unspecified: Secondary | ICD-10-CM | POA: Insufficient documentation

## 2011-07-13 DIAGNOSIS — E119 Type 2 diabetes mellitus without complications: Secondary | ICD-10-CM | POA: Insufficient documentation

## 2011-07-13 LAB — URINALYSIS, ROUTINE W REFLEX MICROSCOPIC
Glucose, UA: NEGATIVE mg/dL
Nitrite: POSITIVE — AB
Protein, ur: 30 mg/dL — AB
Specific Gravity, Urine: 1.027 (ref 1.005–1.030)
Urobilinogen, UA: 0.2 mg/dL (ref 0.0–1.0)
pH: 6 (ref 5.0–8.0)

## 2011-07-13 LAB — DIFFERENTIAL
Basophils Absolute: 0 10*3/uL (ref 0.0–0.1)
Lymphocytes Relative: 39 % (ref 12–46)
Monocytes Absolute: 0.4 10*3/uL (ref 0.1–1.0)
Monocytes Relative: 7 % (ref 3–12)
Neutro Abs: 2.9 10*3/uL (ref 1.7–7.7)
Neutrophils Relative %: 52 % (ref 43–77)

## 2011-07-13 LAB — BASIC METABOLIC PANEL
CO2: 25 mEq/L (ref 19–32)
Chloride: 106 mEq/L (ref 96–112)
Creatinine, Ser: 1.01 mg/dL (ref 0.50–1.10)
GFR calc Af Amer: 65 mL/min — ABNORMAL LOW (ref >90)
Potassium: 3.9 mEq/L (ref 3.5–5.1)

## 2011-07-13 LAB — URINE MICROSCOPIC-ADD ON

## 2011-07-13 LAB — CBC
HCT: 32.8 % — ABNORMAL LOW (ref 36.0–46.0)
Hemoglobin: 10.8 g/dL — ABNORMAL LOW (ref 12.0–15.0)
RDW: 13.3 % (ref 11.5–15.5)
WBC: 5.6 10*3/uL (ref 4.0–10.5)

## 2011-07-13 MED ORDER — MORPHINE SULFATE 4 MG/ML IJ SOLN
6.0000 mg | INTRAMUSCULAR | Status: AC | PRN
  Administered 2011-07-13: 4 mg via INTRAVENOUS
  Administered 2011-07-13: 6 mg via INTRAVENOUS
  Filled 2011-07-13: qty 1

## 2011-07-13 MED ORDER — CIPROFLOXACIN HCL 250 MG PO TABS: 250.0000 mg | ORAL_TABLET | Freq: Two times a day (BID) | ORAL | Status: AC

## 2011-07-13 MED ORDER — SODIUM CHLORIDE 0.9 % IV BOLUS (SEPSIS)
1000.0000 mL | Freq: Once | INTRAVENOUS | Status: AC
  Administered 2011-07-13: 1000 mL via INTRAVENOUS

## 2011-07-13 MED ORDER — CIPROFLOXACIN IN D5W 400 MG/200ML IV SOLN
400.0000 mg | Freq: Once | INTRAVENOUS | Status: AC
  Administered 2011-07-13: 400 mg via INTRAVENOUS
  Filled 2011-07-13: qty 200

## 2011-07-13 MED ORDER — CIPROFLOXACIN HCL 500 MG PO TABS: 250.0000 mg | ORAL_TABLET | Freq: Once | ORAL | Status: DC

## 2011-07-13 MED ORDER — MORPHINE SULFATE 4 MG/ML IJ SOLN: 6.0000 mg | INTRAMUSCULAR | Status: DC | PRN

## 2011-07-13 MED ORDER — DIPHENHYDRAMINE HCL 50 MG/ML IJ SOLN
25.0000 mg | Freq: Once | INTRAMUSCULAR | Status: AC
  Administered 2011-07-13: 25 mg via INTRAVENOUS
  Filled 2011-07-13: qty 1

## 2011-07-13 MED ORDER — ONDANSETRON HCL 4 MG/2ML IJ SOLN
4.0000 mg | INTRAMUSCULAR | Status: DC | PRN
  Administered 2011-07-13: 4 mg via INTRAVENOUS
  Filled 2011-07-13: qty 2

## 2011-07-13 MED ORDER — HYDROCODONE-ACETAMINOPHEN 5-325 MG PO TABS: 1.0000 | ORAL_TABLET | Freq: Four times a day (QID) | ORAL | Status: DC | PRN

## 2011-07-13 NOTE — Telephone Encounter (Signed)
Pt with hx of CDIFF, PUD, Recurrent N/V Adhesions, Short Bowel Syndrome, Chronic Abd. Pain, Esophageal Stricture, HH, Internal Hemorrhoids, Diverticulosis. Last COLON 10/01/10 that was normal. Last OV 09/16/10 for diarrhea. Today, this pt c/o increased watery diarrhea and L side abdominal pain that has gotten worse. She denies a fever and blood in her stool. She hasn't been on any recent antibiotic. If she drinks anything, she starts cramping again. Offered pt an appt tomorrow with Dr Jarold Motto, but pt thinks she may be dehydrated, so she'll go to [facility] ER.

## 2011-07-13 NOTE — ED Notes (Signed)
Cipro complete INT d/c'd

## 2011-07-13 NOTE — ED Notes (Signed)
Will d/cafter iv cipro in infused

## 2011-07-13 NOTE — ED Provider Notes (Signed)
History     CSN: [id number]  Arrival date & time 07/13/11  1429   First MD Initiated Contact with Patient 07/13/11 1810      Chief Complaint  Patient presents with  . Abdominal Pain  . Diarrhea    (Consider location/radiation/quality/duration/timing/severity/associated sxs/prior treatment) HPI Comments: Patient with a history of small bowel obstruction, diverticulosis, and abdominal surgeries including abdominal hysterectomy and cholecystectomy presents to the emergency department with chief complaint of left lower quadrant abdominal pain and diarrhea.  Patient states that her symptoms began on Thursday and have gradually progressed.  Patient does not recall any exacerbating or alleviating factors.  PCP Dr. Everardo All GI: Dr. Jarold Motto    Patient is a 66 y.o. female presenting with abdominal pain and diarrhea. The history is provided by the patient.  Abdominal Pain The primary symptoms of the illness include abdominal pain, nausea and diarrhea. The primary symptoms of the illness do not include fever, fatigue, shortness of breath, vomiting, hematemesis, hematochezia, dysuria, vaginal discharge or vaginal bleeding. The current episode started more than 2 days ago. The onset of the illness was gradual. The problem has been gradually worsening.  The abdominal pain began more than 2 days ago. The abdominal pain has been gradually worsening since its onset. The abdominal pain is located in the LLQ. The abdominal pain does not radiate. The severity of the abdominal pain is 10/10. The abdominal pain is relieved by nothing.  Nausea began 3 to 5 days ago.  The patient states that she believes she is currently not pregnant. The patient has had a change in bowel habit. Additional symptoms associated with the illness include anorexia, urgency and frequency. Symptoms associated with the illness do not include chills, diaphoresis, constipation, hematuria or back pain. Significant associated medical issues  include diabetes. Associated medical issues comments: hysterectomy, cholesysectomy.  Diarrhea The primary symptoms include abdominal pain, nausea and diarrhea. Primary symptoms do not include fever, weight loss, fatigue, vomiting, melena, hematemesis, jaundice, hematochezia, dysuria, myalgias, arthralgias or rash. The illness began 3 to 5 days ago. The problem has not changed since onset. The diarrhea is watery. The diarrhea occurs 2 to 4 times per day.  The illness is also significant for anorexia. The illness does not include chills, dysphagia, odynophagia, bloating, constipation, tenesmus, back pain or itching. Associated medical issues comments: hysterectomy, cholesysectomy.    Past Medical History  Diagnosis Date  . Obesity   . PUD (peptic ulcer disease)   . Anemia   . Leukopenia   . Abdominal pain   . Short bowel syndrome   . Internal hemorrhoids without mention of complication   . Stricture and stenosis of esophagus   . Hiatal hernia   . Osteoarthrosis, unspecified whether generalized or localized, unspecified site   . Thyrotoxicosis without mention of goiter or other cause, without mention of thyrotoxic crisis or storm   . Unspecified essential hypertension   . Other and unspecified hyperlipidemia   . Gout, unspecified   . Diverticulosis of colon (without mention of hemorrhage)   . Type II or unspecified type diabetes mellitus without mention of complication, not stated as uncontrolled   . C. difficile colitis   . VITAMIN B12 DEFICIENCY 08/30/2009  . GOITER, MULTINODULAR 04/02/2009  . HYPOTHYROIDISM, POST-RADIATION 08/13/2009  . ANEMIA, IRON DEFICIENCY 05/08/2009  . ASYMPTOMATIC POSTMENOPAUSAL STATUS 10/11/2008  . Esophageal reflux 06/12/2008    Past Surgical History  Procedure Date  . Lysis adhension     multiple  . Cholecystectomy   .  Vein ligation and stripping     Right leg  . Small intestine surgery     multiple  . Abdominal hysterectomy     with BSO  . Lithotripsy    . Bunionectomy     bilateral  . Colonoscopy   . Esophagogastroduodenoscopy 07/15/2005  . Thyroid ultrasound 12/1994 and 12/1995    Family History  Problem Relation Age of Onset  . Esophageal cancer Son     deceased  . Cancer Son     Esophageal Cancer  . Diabetes Mother   . Heart disease Mother   . Colon cancer Paternal Uncle   . Cancer Paternal Uncle     Colon Cancer  . Kidney disease Sister   . Diabetes Father   . Kidney disease Brother   . Kidney disease Brother   . Kidney disease Brother     History  Substance Use Topics  . Smoking status: Former Games developer  . Smokeless tobacco: Not on file  . Alcohol Use: No    OB History    Grav Para Term Preterm Abortions TAB SAB Ect Mult Living                  Review of Systems  Constitutional: Negative for fever, chills, weight loss, diaphoresis and fatigue.  Respiratory: Negative for shortness of breath.   Gastrointestinal: Positive for nausea, abdominal pain, diarrhea and anorexia. Negative for dysphagia, vomiting, constipation, melena, hematochezia, bloating, hematemesis and jaundice.  Genitourinary: Positive for urgency and frequency. Negative for dysuria, hematuria, vaginal bleeding and vaginal discharge.  Musculoskeletal: Negative for myalgias, back pain and arthralgias.  Skin: Negative for itching and rash.  All other systems reviewed and are negative.    Allergies  Aspirin; Morphine; and Iodine  Home Medications   Current Outpatient Rx  Name Route Sig Dispense Refill  . CALCIUM-VITAMIN D 600-200 MG-UNIT PO CAPS Oral Take 1 capsule by mouth daily.      . CHOLESTYRAMINE 4 G PO PACK  Questran 4grams in non carbonated liquid daily by mouth- mix in a non carbonated liquid and take midmorning 2 hours apart from other meds 60 each 11  . CYANOCOBALAMIN 1000 MCG/ML IJ SOLN Intramuscular Inject 1,000 mcg into the muscle every 30 (thirty) days.      Marland Kitchen LEVOTHYROXINE SODIUM 125 MCG PO TABS  1 TAB ONCE DAILY 30 tablet 5  .  LOVASTATIN 20 MG PO TABS  TAKE 1 TABLET EVERY DAY 30 tablet 5  . MAGNESIUM CHLORIDE 64 MG PO TBEC Oral Take 2 tablets by mouth daily.      Marland Kitchen NITROGLYCERIN 0.4 MG SL SUBL Sublingual Place 1 tablet (0.4 mg total) under the tongue every 5 (five) minutes as needed for chest pain. 25 tablet 12  . OMEPRAZOLE 40 MG PO CPDR Oral Take 1 capsule (40 mg total) by mouth daily. 30 capsule 11  . PROMETHAZINE HCL 25 MG PO TABS  TAKE 1 TABLET BY MOUTH EVERY 6 HOURS AS NEEDED FOR NAUSEA 30 tablet 1  . THIAMINE HCL 100 MG PO TABS Oral Take 100 mg by mouth daily.      . TRAZODONE HCL 300 MG PO TABS  As directed      BP 113/80  Pulse 103  Temp(Src) 98.8 F (37.1 C) (Oral)  Resp 16  Ht 5\' 5"  (1.651 m)  Wt 210 lb (95.255 kg)  BMI 34.95 kg/m2  SpO2 99%  Physical Exam  Nursing note and vitals reviewed. Constitutional: Vital signs are normal. She appears  well-developed and well-nourished. No distress.  HENT:  Head: Normocephalic and atraumatic.  Mouth/Throat: Uvula is midline, oropharynx is clear and moist and mucous membranes are normal.  Eyes: Conjunctivae and EOM are normal. Pupils are equal, round, and reactive to light.  Neck: Normal range of motion and full passive range of motion without pain. Neck supple. No spinous process tenderness and no muscular tenderness present. No rigidity. No Brudzinski's sign noted.  Cardiovascular: Normal rate, regular rhythm, normal heart sounds and intact distal pulses.   Pulmonary/Chest: Effort normal and breath sounds normal. No accessory muscle usage. Not tachypneic. No respiratory distress.  Abdominal: Soft. Normal appearance and bowel sounds are normal. She exhibits no distension, no fluid wave, no ascites, no pulsatile midline mass and no mass. There is tenderness in the left lower quadrant. There is no CVA tenderness. No hernia.    Lymphadenopathy:    She has no cervical adenopathy.  Neurological: She is alert.  Skin: Skin is warm and dry. No rash noted. She is  not diaphoretic.  Psychiatric: She has a normal mood and affect. Her speech is normal and behavior is normal.    ED Course  Procedures (including critical care time)  Labs Reviewed  URINALYSIS, ROUTINE W REFLEX MICROSCOPIC - Abnormal; Notable for the following:    Color, Urine AMBER (*) BIOCHEMICALS MAY BE AFFECTED BY COLOR   APPearance CLOUDY (*)    Hgb urine dipstick SMALL (*)    Bilirubin Urine SMALL (*)    Ketones, ur TRACE (*)    Protein, ur 30 (*)    Nitrite POSITIVE (*)    Leukocytes, UA MODERATE (*)    All other components within normal limits  URINE MICROSCOPIC-ADD ON - Abnormal; Notable for the following:    Bacteria, UA MANY (*)    All other components within normal limits   No results found. CBC    Component Value Date/Time   WBC 5.6 07/13/2011 2009   RBC 3.75* 07/13/2011 2009   HGB 10.8* 07/13/2011 2009   HCT 32.8* 07/13/2011 2009   PLT 301 07/13/2011 2009   MCV 87.5 07/13/2011 2009   MCH 28.8 07/13/2011 2009   MCHC 32.9 07/13/2011 2009   RDW 13.3 07/13/2011 2009   LYMPHSABS 2.2 07/13/2011 2009   MONOABS 0.4 07/13/2011 2009   EOSABS 0.1 07/13/2011 2009   BASOSABS 0.0 07/13/2011 2009   CT Abd IMPRESSION:  1. Fluid levels throughout the colon are compatible with diarrhea. 2. Similar appearance of multiple loops of small bowel clustered in the lower abdomen with effusions. No definite obstruction is present. 3. Atherosclerosis. 4. Stable cardiomegaly without failure.  No diagnosis found.    MDM  UTI, LLQ tenderness  Facial be treated in the emergency department with lysis of Cipro and sent home with an antibiotic.  Culture of urine is pending.  Patient will also sent home with 15 Norco.  Patient is ultimately stable and in no acute distress prior to discharge.  Patient states she will followup with Dr. Jarold Motto if symptoms of abdominal pain and diarrhea persist.  Patient has no questions or complaints at this time.        Jaci Carrel, [place] 07/13/11  2210

## 2011-07-13 NOTE — Discharge Instructions (Signed)
Follow up with the Gastroenterologist or general surgeon listed above for further evaluation of your abdominal pain.  Take your full course of antibiotics as prescribed to treat your urinary tract infection. Only use your pain medication for severe pain. Do not operate heavy machinery while on pain medication or muscle relaxer. Note that your pain medication contains acetaminophen (Tylenol) & its is not reccommended that you use additional acetaminophen (Tylenol) while taking this medication.   Abdominal Pain  Your exam might not show the exact reason you have abdominal pain. Since there are many different causes of abdominal pain, another checkup and more tests may be needed. It is very important to follow up for lasting (persistent) or worsening symptoms. A possible cause of abdominal pain in any person who still has his or her appendix is acute appendicitis. Appendicitis is often hard to diagnose. Normal blood tests, urine tests, ultrasound, and CT scans do not completely rule out early appendicitis or other causes of abdominal pain. Sometimes, only the changes that happen over time will allow appendicitis and other causes of abdominal pain to be determined. Other potential problems that may require surgery may also take time to become more apparent. Because of this, it is important that you follow all of the instructions below.   HOME CARE INSTRUCTIONS  Do not take laxatives unless directed by your caregiver. Rest as much as possible.  Do not eat solid food until your pain is gone: A diet of water, weak decaffeinated tea, broth or bouillon, gelatin, oral rehydration solutions (ORS), frozen ice pops, or ice chips may be helpful.  When pain is gone: Start a light diet (dry toast, crackers, applesauce, or white rice). Increase the diet slowly as long as it does not bother you. Eat no dairy products (including cheese and eggs) and no spicy, fatty, fried, or high-fiber foods.  Use no alcohol, caffeine, or  cigarettes.  Take your regular medicines unless your caregiver told you not to.  Take any prescribed medicine as directed.   SEEK IMMEDIATE MEDICAL CARE IF:  The pain does not go away.  You have a fever >101 that persists You keep throwing up (vomiting) or cannot drink liquids.  The pain becomes localized (Pain in the right side could possibly be appendicitis. In an adult, pain in the left lower portion of the abdomen could be colitis or diverticulitis). You pass bloody or black tarry stools.  You have shaking chills.  There is blood in your vomit or you see blood in your bowel movements.  Your bowel movements stop (become blocked) or you cannot pass gas.  You have bloody, frequent, or painful urination.  You have yellow discoloration in the skin or whites of the eyes.  Your stomach becomes bloated or bigger.  You have dizziness or fainting.  You have chest or back pain.   Urinary Tract Infection Infections of the urinary tract can start in several places. A bladder infection (cystitis), a kidney infection (pyelonephritis), and a prostate infection (prostatitis) are different types of urinary tract infections (UTIs). They usually get better if treated with medicines (antibiotics) that kill germs. Take all the medicine until it is gone. You or your child may feel better in a few days, but TAKE ALL MEDICINE or the infection may not respond and may become more difficult to treat. HOME CARE INSTRUCTIONS   Drink enough water and fluids to keep the urine clear or pale yellow. Cranberry juice is especially recommended, in addition to large amounts of water.  Avoid caffeine, tea, and carbonated beverages. They tend to irritate the bladder.   Alcohol may irritate the prostate.   Only take over-the-counter or prescription medicines for pain, discomfort, or fever as directed by your caregiver.  To prevent further infections:  Empty the bladder often. Avoid holding urine for long periods of  time.   After a bowel movement, women should cleanse from front to back. Use each tissue only once.   Empty the bladder before and after sexual intercourse.  FINDING OUT THE RESULTS OF YOUR TEST Not all test results are available during your visit. If your or your child's test results are not back during the visit, make an appointment with your caregiver to find out the results. Do not assume everything is normal if you have not heard from your caregiver or the medical facility. It is important for you to follow up on all test results. SEEK MEDICAL CARE IF:   There is back pain.   Your baby is older than 3 months with a rectal temperature of 100.5 F (38.1 C) or higher for more than 1 day.   Your or your child's problems (symptoms) are no better in 3 days. Return sooner if you or your child is getting worse.  SEEK IMMEDIATE MEDICAL CARE IF:   There is severe back pain or lower abdominal pain.   You or your child develops chills.   You have a fever.   Your baby is older than 3 months with a rectal temperature of 102 F (38.9 C) or higher.   Your baby is 21 months old or younger with a rectal temperature of 100.4 F (38 C) or higher.   There is nausea or vomiting.   There is continued burning or discomfort with urination.  MAKE SURE YOU:   Understand these instructions.   Will watch your condition.   Will get help right away if you are not doing well or get worse.  Document Released: 02/25/2005 Document Revised: 01/28/2011 Document Reviewed: 09/30/2006 [facility] Patient Information 2012 [place], [place].

## 2011-07-13 NOTE — ED Notes (Signed)
Pt states "I've been having pain on the left side, diarrhea, have a hx of bowel obstruction, called Dr. Jarold Motto & was told if I couldn't wait until tomorrow to come over here"

## 2011-07-15 LAB — URINE CULTURE
Colony Count: >=100000
Culture  Setup Time: 201302120121

## 2011-07-16 NOTE — ED Notes (Signed)
+  Urine. Patient treated with Cipro. Sensitive to same. Per protocol MD. °

## 2011-07-23 ENCOUNTER — Telehealth: Payer: Self-pay

## 2011-07-23 NOTE — Telephone Encounter (Signed)
Pt informed of MD's advisement. Pt states that she will callback to schedule OV.

## 2011-07-23 NOTE — Telephone Encounter (Signed)
Pt called stating she has a "crick" in her neck since yesterday, but today she is is very painful to move her head. Pt is requesting advisement from MD, does she need an OV for eval?

## 2011-07-23 NOTE — ED Provider Notes (Signed)
Medical screening examination/treatment/procedure(s) were performed by non-physician practitioner and as supervising physician I was immediately available for consultation/collaboration.  Raeford Razor, MD 07/23/11 [phone]

## 2011-07-23 NOTE — Telephone Encounter (Signed)
You could have ov, but this is the kind of thing you could give a few days, to see if it gets better

## 2011-07-24 ENCOUNTER — Emergency Department (HOSPITAL_COMMUNITY)
Admission: EM | Admit: 2011-07-24 | Discharge: 2011-07-24 | Disposition: A | Discharge status: Home / Self Care | Payer: Medicare Other | Attending: Emergency Medicine | Admitting: Emergency Medicine

## 2011-07-24 ENCOUNTER — Encounter (HOSPITAL_COMMUNITY): Payer: Self-pay | Admitting: *Deleted

## 2011-07-24 DIAGNOSIS — M1990 Unspecified osteoarthritis, unspecified site: Secondary | ICD-10-CM | POA: Insufficient documentation

## 2011-07-24 DIAGNOSIS — K449 Diaphragmatic hernia without obstruction or gangrene: Secondary | ICD-10-CM | POA: Insufficient documentation

## 2011-07-24 DIAGNOSIS — Z87891 Personal history of nicotine dependence: Secondary | ICD-10-CM | POA: Insufficient documentation

## 2011-07-24 DIAGNOSIS — Z9079 Acquired absence of other genital organ(s): Secondary | ICD-10-CM | POA: Insufficient documentation

## 2011-07-24 DIAGNOSIS — R110 Nausea: Secondary | ICD-10-CM | POA: Insufficient documentation

## 2011-07-24 DIAGNOSIS — E538 Deficiency of other specified B group vitamins: Secondary | ICD-10-CM | POA: Insufficient documentation

## 2011-07-24 DIAGNOSIS — K219 Gastro-esophageal reflux disease without esophagitis: Secondary | ICD-10-CM | POA: Insufficient documentation

## 2011-07-24 DIAGNOSIS — Z9889 Other specified postprocedural states: Secondary | ICD-10-CM | POA: Insufficient documentation

## 2011-07-24 DIAGNOSIS — Z800 Family history of malignant neoplasm of digestive organs: Secondary | ICD-10-CM | POA: Insufficient documentation

## 2011-07-24 DIAGNOSIS — M436 Torticollis: Secondary | ICD-10-CM | POA: Insufficient documentation

## 2011-07-24 DIAGNOSIS — E038 Other specified hypothyroidism: Secondary | ICD-10-CM | POA: Insufficient documentation

## 2011-07-24 DIAGNOSIS — E0590 Thyrotoxicosis, unspecified without thyrotoxic crisis or storm: Secondary | ICD-10-CM | POA: Insufficient documentation

## 2011-07-24 DIAGNOSIS — K279 Peptic ulcer, site unspecified, unspecified as acute or chronic, without hemorrhage or perforation: Secondary | ICD-10-CM | POA: Insufficient documentation

## 2011-07-24 MED ORDER — KETOROLAC TROMETHAMINE 60 MG/2ML IM SOLN
60.0000 mg | Freq: Once | INTRAMUSCULAR | Status: AC
  Administered 2011-07-24: 60 mg via INTRAMUSCULAR
  Filled 2011-07-24: qty 2

## 2011-07-24 MED ORDER — OXYCODONE-ACETAMINOPHEN 5-325 MG PO TABS: 2.0000 | ORAL_TABLET | ORAL | Status: DC | PRN

## 2011-07-24 MED ORDER — MORPHINE SULFATE 4 MG/ML IJ SOLN
4.0000 mg | Freq: Once | INTRAMUSCULAR | Status: AC
  Administered 2011-07-24: 4 mg via INTRAMUSCULAR
  Filled 2011-07-24: qty 1

## 2011-07-24 NOTE — Discharge Instructions (Signed)
Use ibuprofen 600 mg every 6 hours for pain.  Use percocet for more severe pain.  Follow up with your doctor for reevaluation. Return for worse symptoms.

## 2011-07-24 NOTE — ED Provider Notes (Addendum)
History     CSN: [id number]  Arrival date & time 07/24/11  0713   First MD Initiated Contact with Patient 07/24/11 [phone]      Chief Complaint  Patient presents with  . Neck Pain    (Consider location/radiation/quality/duration/timing/severity/associated sxs/prior treatment) Patient is a 66 y.o. female presenting with neck pain. The history is provided by the patient and the spouse.  Neck Pain  Pertinent negatives include no headaches and no weakness.   the patient is a 66 year old, female, who presents to the emergency department with nontraumatic neck pain.  For several days.  He is on the left side.  She denies fevers, chills, moist.  Change.  She has had nausea with the pain.  Denies weakness.  She denies pain anywhere else.  She has not had a sore throat.     Past Medical History  Diagnosis Date  . Obesity   . PUD (peptic ulcer disease)   . Anemia   . Leukopenia   . Abdominal pain   . Short bowel syndrome   . Internal hemorrhoids without mention of complication   . Stricture and stenosis of esophagus   . Hiatal hernia   . Osteoarthrosis, unspecified whether generalized or localized, unspecified site   . Thyrotoxicosis without mention of goiter or other cause, without mention of thyrotoxic crisis or storm   . Unspecified essential hypertension   . Other and unspecified hyperlipidemia   . Gout, unspecified   . Diverticulosis of colon (without mention of hemorrhage)   . Type II or unspecified type diabetes mellitus without mention of complication, not stated as uncontrolled   . C. difficile colitis   . VITAMIN B12 DEFICIENCY 08/30/2009  . GOITER, MULTINODULAR 04/02/2009  . HYPOTHYROIDISM, POST-RADIATION 08/13/2009  . ANEMIA, IRON DEFICIENCY 05/08/2009  . ASYMPTOMATIC POSTMENOPAUSAL STATUS 10/11/2008  . Esophageal reflux 06/12/2008    Past Surgical History  Procedure Date  . Lysis adhension     multiple  . Cholecystectomy   . Vein ligation and stripping     Right leg  .  Small intestine surgery     multiple  . Abdominal hysterectomy     with BSO  . Lithotripsy   . Bunionectomy     bilateral  . Colonoscopy   . Esophagogastroduodenoscopy 07/15/2005  . Thyroid ultrasound 12/1994 and 12/1995    Family History  Problem Relation Age of Onset  . Esophageal cancer Son     deceased  . Cancer Son     Esophageal Cancer  . Diabetes Mother   . Heart disease Mother   . Colon cancer Paternal Uncle   . Cancer Paternal Uncle     Colon Cancer  . Kidney disease Sister   . Diabetes Father   . Kidney disease Brother   . Kidney disease Brother   . Kidney disease Brother     History  Substance Use Topics  . Smoking status: Former Games developer  . Smokeless tobacco: Not on file  . Alcohol Use: No    OB History    Grav Para Term Preterm Abortions TAB SAB Ect Mult Living                  Review of Systems  Constitutional: Negative for fever, chills and unexpected weight change.  HENT: Positive for neck pain. Negative for sore throat.   Respiratory: Negative for cough and shortness of breath.   Gastrointestinal: Positive for nausea. Negative for vomiting.  Neurological: Negative for weakness and headaches.  Hematological:  Negative for adenopathy. Does not bruise/bleed easily.  All other systems reviewed and are negative.    Allergies  Aspirin; Morphine; and Iodine  Home Medications   Current Outpatient Rx  Name Route Sig Dispense Refill  . CALCIUM-VITAMIN D 600-200 MG-UNIT PO CAPS Oral Take 1 capsule by mouth daily.      . CYANOCOBALAMIN 1000 MCG/ML IJ SOLN Intramuscular Inject 1,000 mcg into the muscle every 30 (thirty) days. Next one due the 26th of this month    . LEVOTHYROXINE SODIUM 125 MCG PO TABS  1 TAB ONCE DAILY 30 tablet 5  . LOVASTATIN 20 MG PO TABS  TAKE 1 TABLET EVERY DAY 30 tablet 5  . MAGNESIUM CHLORIDE 64 MG PO TBEC Oral Take 2 tablets by mouth daily.      Marland Kitchen NITROGLYCERIN 0.4 MG SL SUBL Sublingual Place 1 tablet (0.4 mg total) under the  tongue every 5 (five) minutes as needed for chest pain. 25 tablet 12  . OMEPRAZOLE 40 MG PO CPDR Oral Take 1 capsule (40 mg total) by mouth daily. 30 capsule 11  . OXYCODONE-ACETAMINOPHEN 5-325 MG PO TABS Oral Take 1 tablet by mouth every 4 (four) hours as needed. pain    . PROMETHAZINE HCL 25 MG PO TABS  TAKE 1 TABLET BY MOUTH EVERY 6 HOURS AS NEEDED FOR NAUSEA 30 tablet 1  . THIAMINE HCL 100 MG PO TABS Oral Take 100 mg by mouth daily.      . TRAZODONE HCL 300 MG PO TABS  As directed    . CHOLESTYRAMINE 4 G PO PACK  Questran 4grams in non carbonated liquid daily by mouth- mix in a non carbonated liquid and take midmorning 2 hours apart from other meds 60 each 11  . CIPROFLOXACIN HCL 250 MG PO TABS Oral Take 1 tablet (250 mg total) by mouth 2 (two) times daily. 6 tablet 0  . HYDROCODONE-ACETAMINOPHEN 5-325 MG PO TABS Oral Take 1 tablet by mouth every 6 (six) hours as needed for pain. 15 tablet 0    BP 117/77  Pulse 93  Temp(Src) 98.9 F (37.2 C) (Oral)  Resp 17  Wt 210 lb (95.255 kg)  SpO2 99%  Physical Exam  Vitals reviewed. Constitutional: She is oriented to person, place, and time. She appears well-developed and well-nourished.  HENT:  Head: Normocephalic and atraumatic.  Eyes: Conjunctivae are normal. Pupils are equal, round, and reactive to light.  Neck: No JVD present. No tracheal deviation present. No thyromegaly present.       Tenderness along the left sternocleidomastoid muscle with decreased rotation of the head, due to increased pain  Pulmonary/Chest: Effort normal. No stridor.  Musculoskeletal: Normal range of motion.  Lymphadenopathy:    She has no cervical adenopathy.  Neurological: She is alert and oriented to person, place, and time.  Skin: Skin is warm and dry.  Psychiatric: She has a normal mood and affect.    ED Course  Procedures (including critical care time) Torticollis No history of trauma.  No weakness.  No systemic illness or signs of malignancy  Labs  Reviewed - No data to display No results found.   No diagnosis found.  Still has pain.    12:53 PM Pain resolved  MDM  torticollis        Nicholes Stairs, MD 07/24/11 1149  Nicholes Stairs, MD 07/24/11 1253

## 2011-07-24 NOTE — ED Notes (Signed)
Pt states "my neck has been hurting for several days, hurts on the left and in the back"

## 2011-07-28 ENCOUNTER — Ambulatory Visit: Payer: Medicare Other

## 2011-07-29 ENCOUNTER — Ambulatory Visit: Payer: Medicare Other | Admitting: Endocrinology

## 2011-07-29 ENCOUNTER — Ambulatory Visit (INDEPENDENT_AMBULATORY_CARE_PROVIDER_SITE_OTHER): Payer: Medicare Other | Admitting: Internal Medicine

## 2011-07-29 ENCOUNTER — Encounter: Payer: Self-pay | Admitting: Internal Medicine

## 2011-07-29 VITALS — BP 110/78 | HR 86 | Temp 97.8°F | Ht 65.0 in | Wt 208.0 lb

## 2011-07-29 DIAGNOSIS — M542 Cervicalgia: Secondary | ICD-10-CM

## 2011-07-29 DIAGNOSIS — I10 Essential (primary) hypertension: Secondary | ICD-10-CM

## 2011-07-29 DIAGNOSIS — E538 Deficiency of other specified B group vitamins: Secondary | ICD-10-CM

## 2011-07-29 DIAGNOSIS — M549 Dorsalgia, unspecified: Secondary | ICD-10-CM

## 2011-07-29 MED ORDER — CYANOCOBALAMIN 1000 MCG/ML IJ SOLN
1000.0000 ug | Freq: Once | INTRAMUSCULAR | Status: AC
  Administered 2011-07-29: 1000 ug via INTRAMUSCULAR

## 2011-07-29 MED ORDER — OXYCODONE-ACETAMINOPHEN 5-325 MG PO TABS: 1.0000 | ORAL_TABLET | Freq: Four times a day (QID) | ORAL | Status: DC | PRN

## 2011-07-29 MED ORDER — KETOROLAC TROMETHAMINE 30 MG/ML IJ SOLN
30.0000 mg | Freq: Once | INTRAMUSCULAR | Status: AC
  Administered 2011-07-29: 30 mg via INTRAMUSCULAR

## 2011-07-29 MED ORDER — CYCLOBENZAPRINE HCL 5 MG PO TABS: 5.0000 mg | ORAL_TABLET | Freq: Three times a day (TID) | ORAL | Status: AC | PRN

## 2011-07-29 MED ORDER — CYCLOBENZAPRINE HCL 5 MG PO TABS: 5.0000 mg | ORAL_TABLET | Freq: Three times a day (TID) | ORAL | Status: DC | PRN

## 2011-07-29 MED ORDER — PREDNISONE 10 MG PO TABS: 10.0000 mg | ORAL_TABLET | Freq: Every day | ORAL | Status: DC

## 2011-07-29 NOTE — Assessment & Plan Note (Signed)
Mod to severe with tender at base c-spine (no swelling, rash, erythema) and evidence for left SCM and trapezoid spasm as well, and decreased left shoulder/neck ROM;  Not clearly radicular, however;  For toradol IM now, refill pain med as per usual for chronic other back pain, add flexeril prn, trial low dose prednisone (? cspine DJD flare) , and consider ortho referral if not improved 1-2 wks

## 2011-07-29 NOTE — Patient Instructions (Addendum)
You had the B12 shot today You had the toradol (pain) shot today Your pain medication is refilled today Take all new medications as prescribed - the muscle relaxer, and low dose prednisone (anti-inflammatory) - both sent to the pharmacy If not improved in 1-2 wks, please call for orthopedic referral for neck and shoulder pain

## 2011-07-31 ENCOUNTER — Ambulatory Visit
Admission: RE | Admit: 2011-07-31 | Discharge: 2011-07-31 | Disposition: A | Discharge status: Home / Self Care | Payer: Medicare Other | Source: Ambulatory Visit | Attending: Endocrinology | Admitting: Endocrinology

## 2011-07-31 DIAGNOSIS — Z1231 Encounter for screening mammogram for malignant neoplasm of breast: Secondary | ICD-10-CM

## 2011-08-02 ENCOUNTER — Encounter: Payer: Self-pay | Admitting: Internal Medicine

## 2011-08-02 NOTE — Assessment & Plan Note (Signed)
Ok for b12 IM today,  to f/u any worsening symptoms or concerns

## 2011-08-02 NOTE — Assessment & Plan Note (Signed)
stable overall by hx and exam, most recent data reviewed with pt, and pt to continue medical treatment as before  BP Readings from Last 3 Encounters:  07/29/11 110/78  07/24/11 129/81  07/13/11 99/66

## 2011-08-02 NOTE — Assessment & Plan Note (Signed)
For chronic pain med refill, stable overall by hx and exam, most recent data reviewed with pt, and pt to continue medical treatment as before Lab Results  Component Value Date   WBC 5.6 07/13/2011   HGB 10.8* 07/13/2011   HCT 32.8* 07/13/2011   PLT 301 07/13/2011   GLUCOSE 80 07/13/2011   CHOL 140 06/24/2011   TRIG 218.0* 06/24/2011   HDL 71.10 06/24/2011   LDLDIRECT 52.1 06/24/2011   LDLCALC 56 05/15/2010   ALT 33 06/24/2011   AST 29 06/24/2011   NA 141 07/13/2011   K 3.9 07/13/2011   CL 106 07/13/2011   CREATININE 1.01 07/13/2011   BUN 16 07/13/2011   CO2 25 07/13/2011   TSH 0.76 06/24/2011   HGBA1C 5.6 06/24/2011   MICROALBUR 1.9 06/24/2011

## 2011-08-02 NOTE — Progress Notes (Signed)
Subjective:    Patient ID: [name], female    DOB: Nov 28, 1945, 66 y.o.   MRN: [id number]  HPI  Here to f/u, after seen and tx at ER for dx torticollis.  Unfortunately tx so far not helping, in fact overall pain not improvemenet, if anything some worse, involving not only the left SCM, but diffuse trapezoid area as well , starting at base of left neck, to shoulder with visible swelling compared to right.  Still makred pain to turn head horizontal or laterally, but no HA, blurred vision, sinus or ear or ST pain, no mass or related swelling other than above.  No radicular pain or UE pain/weak/numb, and no bowel or bladder change, fever, wt loss,  worsening LE pain/numbness/weakness, gait change or falls.  No fall, heavy lifiting, or other injury.  Pt denies chest pain, increased sob or doe, wheezing, orthopnea, PND, increased LE swelling, palpitations, dizziness or syncope.  Pt denies new neurological symptoms such as new headache, or facial or extremity weakness or numbness   Pt denies polydipsia, polyuria.  Recent UTI tx, adn  Denies urinary symptoms such as dysuria, frequency, urgency,or hematuria. Past Medical History  Diagnosis Date  . Obesity   . PUD (peptic ulcer disease)   . Anemia   . Leukopenia   . Abdominal pain   . Short bowel syndrome   . Internal hemorrhoids without mention of complication   . Stricture and stenosis of esophagus   . Hiatal hernia   . Osteoarthrosis, unspecified whether generalized or localized, unspecified site   . Thyrotoxicosis without mention of goiter or other cause, without mention of thyrotoxic crisis or storm   . Unspecified essential hypertension   . Other and unspecified hyperlipidemia   . Gout, unspecified   . Diverticulosis of colon (without mention of hemorrhage)   . Type II or unspecified type diabetes mellitus without mention of complication, not stated as uncontrolled   . C. difficile colitis   . VITAMIN B12 DEFICIENCY 08/30/2009  .  GOITER, MULTINODULAR 04/02/2009  . HYPOTHYROIDISM, POST-RADIATION 08/13/2009  . ANEMIA, IRON DEFICIENCY 05/08/2009  . ASYMPTOMATIC POSTMENOPAUSAL STATUS 10/11/2008  . Esophageal reflux 06/12/2008   Past Surgical History  Procedure Date  . Lysis adhension     multiple  . Cholecystectomy   . Vein ligation and stripping     Right leg  . Small intestine surgery     multiple  . Abdominal hysterectomy     with BSO  . Lithotripsy   . Bunionectomy     bilateral  . Colonoscopy   . Esophagogastroduodenoscopy 07/15/2005  . Thyroid ultrasound 12/1994 and 12/1995    reports that she has quit smoking. She does not have any smokeless tobacco history on file. She reports that she does not drink alcohol or use illicit drugs. family history includes Cancer in her paternal uncle and son; Colon cancer in her paternal uncle; Diabetes in her father and mother; Esophageal cancer in her son; Heart disease in her mother; and Kidney disease in her brothers and sister. Allergies  Allergen Reactions  . Aspirin     REACTION: Gi Intolerance/ Burning in stomach  . Iodine Rash    Allergic to IVP dye   Current Outpatient Prescriptions on File Prior to Visit  Medication Sig Dispense Refill  . Calcium Carbonate-Vitamin D (CALCIUM-VITAMIN D) 600-200 MG-UNIT CAPS Take 1 capsule by mouth daily.        . cholestyramine (QUESTRAN) 4 G packet Questran 4grams in non carbonated liquid  daily by mouth- mix in a non carbonated liquid and take midmorning 2 hours apart from other meds  60 each  11  . cyanocobalamin (,VITAMIN B-12,) 1000 MCG/ML injection Inject 1,000 mcg into the muscle every 30 (thirty) days. Next one due the 26th of this month      . levothyroxine (SYNTHROID, LEVOTHROID) 125 MCG tablet 1 TAB ONCE DAILY  30 tablet  5  . lovastatin (MEVACOR) 20 MG tablet TAKE 1 TABLET EVERY DAY  30 tablet  5  . magnesium chloride (SLOW-MAG) 64 MG TBEC Take 2 tablets by mouth daily.        . nitroGLYCERIN (NITROSTAT) 0.4 MG SL  tablet Place 1 tablet (0.4 mg total) under the tongue every 5 (five) minutes as needed for chest pain.  25 tablet  12  . promethazine (PHENERGAN) 25 MG tablet TAKE 1 TABLET BY MOUTH EVERY 6 HOURS AS NEEDED FOR NAUSEA  30 tablet  1  . thiamine 100 MG tablet Take 100 mg by mouth daily.        . trazodone (DESYREL) 300 MG tablet As directed      . omeprazole (PRILOSEC) 40 MG capsule Take 1 capsule (40 mg total) by mouth daily.  30 capsule  11   Review of Systems Review of Systems  Constitutional: Negative for diaphoresis and unexpected weight change.  HENT: Negative for drooling and tinnitus.   Eyes: Negative for photophobia and visual disturbance.  Respiratory: Negative for choking and stridor.   Gastrointestinal: Negative for vomiting and blood in stool.  Genitourinary: Negative for hematuria and decreased urine volume.      Objective:   Physical Exam BP 110/78  Pulse 86  Temp(Src) 97.8 F (36.6 C) (Oral)  Ht 5\' 5"  (1.651 m)  Wt 208 lb (94.348 kg)  BMI 34.61 kg/m2  SpO2 96% Physical Exam  VS noted, not ill appearing but quite uncomfortable Constitutional: Pt appears well-developed and well-nourished.  HENT: Head: Normocephalic.  Right Ear: External ear normal.  Left Ear: External ear normal.  Eyes: Conjunctivae and EOM are normal. Pupils are equal, round, and reactive to light.  Neck: Normal range of motion. Neck supple.  Cardiovascular: Normal rate and regular rhythm.   Pulmonary/Chest: Effort normal and breath sounds normal.  Abd:  Soft, NT, non-distended, + BS Neurological: Pt is alert. No cranial nerve deficit. motor/sens/dtr/gait intact Skin: Skin is warm. No erythema.  Mild tender lower c-spine without swelling or erythema;   Mod to severe tender diffuse left paracervical, trapezoid and scm with mild diffuse swelling trapezoid compared to right Psychiatric: Pt behavior is normal. Thought content normal.     Assessment & Plan:

## 2011-08-04 ENCOUNTER — Other Ambulatory Visit: Payer: Self-pay | Admitting: Endocrinology

## 2011-08-04 DIAGNOSIS — R928 Other abnormal and inconclusive findings on diagnostic imaging of breast: Secondary | ICD-10-CM

## 2011-08-07 ENCOUNTER — Ambulatory Visit
Admission: RE | Admit: 2011-08-07 | Discharge: 2011-08-07 | Disposition: A | Discharge status: Home / Self Care | Payer: Medicare Other | Source: Ambulatory Visit | Attending: Endocrinology | Admitting: Endocrinology

## 2011-08-07 ENCOUNTER — Other Ambulatory Visit: Payer: Self-pay | Admitting: Endocrinology

## 2011-08-07 DIAGNOSIS — R928 Other abnormal and inconclusive findings on diagnostic imaging of breast: Secondary | ICD-10-CM

## 2011-08-07 DIAGNOSIS — R921 Mammographic calcification found on diagnostic imaging of breast: Secondary | ICD-10-CM

## 2011-08-11 ENCOUNTER — Other Ambulatory Visit: Payer: Medicare Other

## 2011-08-13 ENCOUNTER — Other Ambulatory Visit: Payer: Self-pay | Admitting: Endocrinology

## 2011-08-13 ENCOUNTER — Ambulatory Visit
Admission: RE | Admit: 2011-08-13 | Discharge: 2011-08-13 | Disposition: A | Discharge status: Home / Self Care | Payer: Medicare Other | Source: Ambulatory Visit | Attending: Endocrinology | Admitting: Endocrinology

## 2011-08-13 DIAGNOSIS — R921 Mammographic calcification found on diagnostic imaging of breast: Secondary | ICD-10-CM

## 2011-08-13 HISTORY — PX: BREAST BIOPSY: SHX20

## 2011-08-14 ENCOUNTER — Ambulatory Visit
Admission: RE | Admit: 2011-08-14 | Discharge: 2011-08-14 | Disposition: A | Discharge status: Home / Self Care | Payer: Medicare Other | Source: Ambulatory Visit | Attending: Endocrinology | Admitting: Endocrinology

## 2011-08-14 ENCOUNTER — Other Ambulatory Visit: Payer: Self-pay | Admitting: Endocrinology

## 2011-08-14 DIAGNOSIS — R921 Mammographic calcification found on diagnostic imaging of breast: Secondary | ICD-10-CM

## 2011-08-14 DIAGNOSIS — C50912 Malignant neoplasm of unspecified site of left female breast: Secondary | ICD-10-CM

## 2011-08-17 ENCOUNTER — Other Ambulatory Visit: Payer: Self-pay | Admitting: *Deleted

## 2011-08-17 ENCOUNTER — Telehealth: Payer: Self-pay | Admitting: *Deleted

## 2011-08-17 DIAGNOSIS — C50319 Malignant neoplasm of lower-inner quadrant of unspecified female breast: Secondary | ICD-10-CM

## 2011-08-17 DIAGNOSIS — C50312 Malignant neoplasm of lower-inner quadrant of left female breast: Secondary | ICD-10-CM | POA: Insufficient documentation

## 2011-08-17 NOTE — Telephone Encounter (Signed)
Confirmed [facility] for 08/19/11 at 1200 .  Instructions and contact information given.

## 2011-08-18 ENCOUNTER — Ambulatory Visit
Admission: RE | Admit: 2011-08-18 | Discharge: 2011-08-18 | Disposition: A | Discharge status: Home / Self Care | Payer: Medicare Other | Source: Ambulatory Visit | Attending: Endocrinology | Admitting: Endocrinology

## 2011-08-18 DIAGNOSIS — C50912 Malignant neoplasm of unspecified site of left female breast: Secondary | ICD-10-CM

## 2011-08-18 MED ORDER — GADOBENATE DIMEGLUMINE 529 MG/ML IV SOLN
18.0000 mL | Freq: Once | INTRAVENOUS | Status: AC | PRN
  Administered 2011-08-18: 18 mL via INTRAVENOUS

## 2011-08-19 ENCOUNTER — Other Ambulatory Visit: Payer: Self-pay | Admitting: Oncology

## 2011-08-19 ENCOUNTER — Other Ambulatory Visit (HOSPITAL_BASED_OUTPATIENT_CLINIC_OR_DEPARTMENT_OTHER): Payer: Medicare Other | Admitting: Lab

## 2011-08-19 ENCOUNTER — Ambulatory Visit: Payer: Medicare Other

## 2011-08-19 ENCOUNTER — Other Ambulatory Visit: Payer: Self-pay | Admitting: Endocrinology

## 2011-08-19 ENCOUNTER — Ambulatory Visit: Payer: Medicare Other | Attending: General Surgery | Admitting: Physical Therapy

## 2011-08-19 ENCOUNTER — Ambulatory Visit (HOSPITAL_BASED_OUTPATIENT_CLINIC_OR_DEPARTMENT_OTHER): Payer: Medicare Other | Admitting: Oncology

## 2011-08-19 ENCOUNTER — Ambulatory Visit
Admission: RE | Admit: 2011-08-19 | Discharge: 2011-08-19 | Disposition: A | Discharge status: Home / Self Care | Payer: Medicare Other | Source: Ambulatory Visit | Attending: Radiation Oncology | Admitting: Radiation Oncology

## 2011-08-19 ENCOUNTER — Ambulatory Visit (HOSPITAL_BASED_OUTPATIENT_CLINIC_OR_DEPARTMENT_OTHER): Payer: Medicare Other | Admitting: General Surgery

## 2011-08-19 ENCOUNTER — Encounter (INDEPENDENT_AMBULATORY_CARE_PROVIDER_SITE_OTHER): Payer: Self-pay | Admitting: General Surgery

## 2011-08-19 ENCOUNTER — Encounter: Payer: Self-pay | Admitting: Oncology

## 2011-08-19 VITALS — BP 135/87 | HR 82 | Temp 97.7°F | Ht 65.0 in | Wt 213.4 lb

## 2011-08-19 DIAGNOSIS — IMO0001 Reserved for inherently not codable concepts without codable children: Secondary | ICD-10-CM | POA: Insufficient documentation

## 2011-08-19 DIAGNOSIS — M25619 Stiffness of unspecified shoulder, not elsewhere classified: Secondary | ICD-10-CM | POA: Insufficient documentation

## 2011-08-19 DIAGNOSIS — C50319 Malignant neoplasm of lower-inner quadrant of unspecified female breast: Secondary | ICD-10-CM

## 2011-08-19 DIAGNOSIS — IMO0002 Reserved for concepts with insufficient information to code with codable children: Secondary | ICD-10-CM

## 2011-08-19 DIAGNOSIS — R293 Abnormal posture: Secondary | ICD-10-CM | POA: Insufficient documentation

## 2011-08-19 DIAGNOSIS — Z803 Family history of malignant neoplasm of breast: Secondary | ICD-10-CM | POA: Insufficient documentation

## 2011-08-19 DIAGNOSIS — Z171 Estrogen receptor negative status [ER-]: Secondary | ICD-10-CM

## 2011-08-19 LAB — COMPREHENSIVE METABOLIC PANEL
AST: 22 U/L (ref 0–37)
Albumin: 3.7 g/dL (ref 3.5–5.2)
Alkaline Phosphatase: 81 U/L (ref 39–117)
Potassium: 3.2 mEq/L — ABNORMAL LOW (ref 3.5–5.3)
Sodium: 139 mEq/L (ref 135–145)
Total Bilirubin: 0.5 mg/dL (ref 0.3–1.2)
Total Protein: 7.4 g/dL (ref 6.0–8.3)

## 2011-08-19 LAB — CBC WITH DIFFERENTIAL/PLATELET
BASO%: 0 % (ref 0.0–2.0)
EOS%: 0.8 % (ref 0.0–7.0)
Eosinophils Absolute: 0 10*3/uL (ref 0.0–0.5)
MCH: 28.8 pg (ref 25.1–34.0)
MCHC: 33.9 g/dL (ref 31.5–36.0)
MCV: 85.2 fL (ref 79.5–101.0)
MONO%: 6.5 % (ref 0.0–14.0)
NEUT#: 1.9 10*3/uL (ref 1.5–6.5)
RBC: 3.64 10*6/uL — ABNORMAL LOW (ref 3.70–5.45)
RDW: 13.7 % (ref 11.2–14.5)

## 2011-08-19 MED ORDER — LIDOCAINE-PRILOCAINE 2.5-2.5 % EX CREA: TOPICAL_CREAM | CUTANEOUS | Status: DC | PRN

## 2011-08-19 MED ORDER — DEXAMETHASONE 4 MG PO TABS: ORAL_TABLET | ORAL | Status: DC

## 2011-08-19 MED ORDER — ONDANSETRON HCL 8 MG PO TABS: ORAL_TABLET | ORAL | Status: DC

## 2011-08-19 MED ORDER — LORAZEPAM 0.5 MG PO TABS: 0.5000 mg | ORAL_TABLET | Freq: Four times a day (QID) | ORAL | Status: DC | PRN

## 2011-08-19 MED ORDER — PROCHLORPERAZINE 25 MG RE SUPP: 25.0000 mg | Freq: Two times a day (BID) | RECTAL | Status: DC | PRN

## 2011-08-19 MED ORDER — PROCHLORPERAZINE MALEATE 10 MG PO TABS: 10.0000 mg | ORAL_TABLET | Freq: Four times a day (QID) | ORAL | Status: DC | PRN

## 2011-08-19 NOTE — Progress Notes (Signed)
[facility] Radiation Oncology NEW PATIENT EVALUATION  Name: [name] MRN: [id number]  Date: 08/19/2011  DOB: 1945-09-10  Status: outpatient   CC: [name], MD, MD  Ernestene Mention, MD , Drue Second, and, M.D.   REFERRING PHYSICIAN: Ernestene Mention, MD   DIAGNOSIS: The encounter diagnosis was Cancer of lower-inner quadrant of female breast.    HISTORY OF PRESENT ILLNESS:  [name] is a 66 y.o. female who is seen today in multidisciplinary breast clinic. The patient's case was also discussed at breast conference this morning.  The patient was noted to have a potential abnormality within the left breast on recent screening mammogram. This showed a 7 mm ill-defined suspicious area within the inner lower left breast. No ultrasound correlate finding was identified. 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 Receptor studies have indicated that the tumor is ER negative, PR negative, and HER-2/neu positive. Lymphovascular space invasion was also seen. The patient has proceeded with an MRI scan of the breast bilaterally on 08/18/2011. A 1.6 cm irregular enhancing mass was present within the lower inner quadrant of the left breast.  The patient therefore is seen today in clinic along with Dr. [name] and Dr. Park Breed to formulate an overall treatment plan for this newly diagnosed early-stage breast cancer.  PREVIOUS RADIATION THERAPY: No   PAST MEDICAL HISTORY:  has a past medical history of Obesity; PUD (peptic ulcer disease); Anemia; Leukopenia; Abdominal pain; Short bowel syndrome; Internal hemorrhoids without mention of complication; Stricture and stenosis of esophagus; Hiatal hernia; Osteoarthrosis, unspecified whether generalized or localized, unspecified site; Thyrotoxicosis without mention of goiter or other cause, without mention of thyrotoxic crisis or storm;  Unspecified essential hypertension; Other and unspecified hyperlipidemia; Gout, unspecified; Diverticulosis of colon (without mention of hemorrhage); Type II or unspecified type diabetes mellitus without mention of complication, not stated as uncontrolled; C. difficile colitis; VITAMIN B12 DEFICIENCY (08/30/2009); GOITER, MULTINODULAR (04/02/2009); HYPOTHYROIDISM, POST-RADIATION (08/13/2009); ANEMIA, IRON DEFICIENCY (05/08/2009); ASYMPTOMATIC POSTMENOPAUSAL STATUS (10/11/2008); and Esophageal reflux (06/12/2008).     PAST SURGICAL HISTORY:  Past Surgical History  Procedure Date  . Lysis adhension     multiple  . Cholecystectomy   . Vein ligation and stripping     Right leg  . Small intestine surgery     multiple  . Abdominal hysterectomy     with BSO  . Lithotripsy   . Bunionectomy     bilateral  . Colonoscopy   . Esophagogastroduodenoscopy 07/15/2005  . Thyroid ultrasound 12/1994 and 12/1995     FAMILY HISTORY: family history includes Cancer in her paternal uncle and son; Colon cancer in her paternal uncle; Diabetes in her father and mother; Esophageal cancer in her son; Heart disease in her mother; and Kidney disease in her brothers and sister.   SOCIAL HISTORY:  reports that she has quit smoking. She does not have any smokeless tobacco history on file. She reports that she does not drink alcohol or use illicit drugs.   ALLERGIES: Aspirin and Iodine   MEDICATIONS:  Current Outpatient Prescriptions  Medication Sig Dispense Refill  . Calcium Carbonate-Vitamin D (CALCIUM-VITAMIN D) 600-200 MG-UNIT CAPS Take 1 capsule by mouth daily.        . cholestyramine (QUESTRAN) 4 G packet Questran 4grams in non carbonated liquid daily by mouth- mix in a non carbonated liquid and take midmorning 2 hours apart from other meds  60 each  11  .  cyanocobalamin (,VITAMIN B-12,) 1000 MCG/ML injection Inject 1,000 mcg into the muscle every 30 (thirty) days. Next one due the 26th of this month      .  dexamethasone (DECADRON) 4 MG tablet Take 2 tablets two times a day the day before Taxotere. Then take 2 tabs two times a day starting the day after chemo for 3 days.  30 tablet  1  . levothyroxine (SYNTHROID, LEVOTHROID) 125 MCG tablet 1 TAB ONCE DAILY  30 tablet  5  . lidocaine-prilocaine (EMLA) cream Apply topically as needed.  30 g  3  . LORazepam (ATIVAN) 0.5 MG tablet Take 1 tablet (0.5 mg total) by mouth every 6 (six) hours as needed (Nausea or vomiting).  30 tablet  0  . lovastatin (MEVACOR) 20 MG tablet TAKE 1 TABLET EVERY DAY  30 tablet  5  . magnesium chloride (SLOW-MAG) 64 MG TBEC Take 2 tablets by mouth daily.        . nitroGLYCERIN (NITROSTAT) 0.4 MG SL tablet Place 1 tablet (0.4 mg total) under the tongue every 5 (five) minutes as needed for chest pain.  25 tablet  12  . omeprazole (PRILOSEC) 40 MG capsule Take 1 capsule (40 mg total) by mouth daily.  30 capsule  11  . ondansetron (ZOFRAN) 8 MG tablet Take 1 tablet two times a day starting the day after chemo for 3 days. Then take 1 tab two times a day as needed for nausea or vomiting.  30 tablet  1  . oxyCODONE-acetaminophen (PERCOCET) 5-325 MG per tablet Take 1 tablet by mouth every 6 (six) hours as needed. pain  120 tablet  0  . predniSONE (DELTASONE) 10 MG tablet Take 1 tablet (10 mg total) by mouth daily.  7 tablet  0  . prochlorperazine (COMPAZINE) 10 MG tablet Take 1 tablet (10 mg total) by mouth every 6 (six) hours as needed (Nausea or vomiting).  30 tablet  1  . prochlorperazine (COMPAZINE) 25 MG suppository Place 1 suppository (25 mg total) rectally every 12 (twelve) hours as needed for nausea.  12 suppository  3  . promethazine (PHENERGAN) 25 MG tablet TAKE 1 TABLET BY MOUTH EVERY 6 HOURS AS NEEDED FOR NAUSEA  30 tablet  1  . thiamine 100 MG tablet Take 100 mg by mouth daily.        . trazodone (DESYREL) 300 MG tablet As directed         REVIEW OF SYSTEMS:  A comprehensive review of systems was negative.    PHYSICAL  EXAM:  vitals were not taken for this visit.  General: Well-developed female in no acute distress HEENT: Normocephalic, atraumatic; oral cavity clear Neck: Supple without any lymphadenopathy Cardiovascular: Regular rate and rhythm Respiratory: Clear to auscultation bilaterally Breasts: No palpable findings were present within the left breast and no axillary adenopathy. A negative breast exam on the right with negative axilla as well. The patient does have a scar on the right breast consistent with prior remote biopsy. GI: Soft, nontender, normal bowel sounds Extremities: No edema present Neuro: No focal deficits    LABORATORY DATA:  Lab Results  Component Value Date   WBC 3.7* 08/19/2011   HGB 10.5* 08/19/2011   HCT 31.0* 08/19/2011   MCV 85.2 08/19/2011   PLT 197 08/19/2011   Lab Results  Component Value Date   NA 139 08/19/2011   K 3.2* 08/19/2011   CL 105 08/19/2011   CO2 27 08/19/2011   Lab Results  Component Value  Date   ALT 16 08/19/2011   AST 22 08/19/2011   ALKPHOS 81 08/19/2011   BILITOT 0.5 08/19/2011      IMPRESSION: The patient is a pleasant 66 year old female who is presenting with a stage I breast cancer, clinically T1 C. N0 M0. The tumor is ER negative, PR negative and HER-2/neu positive.   PLAN: A treatment plan was formulated for the patient. She is to see the genetics counselor today and our recommendation is to proceed with genetic analysis. She is to see Dr. [name] in a couple of weeks to review the results of this workup and to formulate a plan. The patient on the basis of the information we have thus 4 does appear to be a good candidate for a lumpectomy with breast conservation treatment. In this setting I would recommend a course of adjuvant radiotherapy for approximately 6-1/2 weeks. This plan was discussed with the patient including the possible benefits risks and side effects. All the patient's questions were answered. We did go over in some detail what radiation  treatment is like in what this would being for her.  The patient also discuss with Dr. [name] the possibility of a mastectomy. This also may be a reasonable option for her, especially depending on the results of her genetic testing. Dr. Park Breed is planning for the patient to undergo adjuvant chemotherapy after her surgery. Therefore if she undergoes a lumpectomy then we will follow chemotherapy with external beam radiation treatment. On the basis of her presentation thus far I would not anticipate her meeting postmastectomy radiation.

## 2011-08-19 NOTE — Patient Instructions (Addendum)
1. Follow up after surgery  2. Genetic counseling and testing

## 2011-08-19 NOTE — Patient Instructions (Signed)
You. will receive Genetic counseling and blood drawing for Genetic testing today.  Yo uwill be given an appointment to return to see Dr. [name] in 2 weeks. We will discuss definitive surgical plans and final decisions at that time.

## 2011-08-19 NOTE — Progress Notes (Signed)
Spoke with patient about financial assistance gave patient EPP application.

## 2011-08-19 NOTE — Progress Notes (Signed)
Patient ID: [name], female   DOB: 1945-10-28, 66 y.o.   MRN: [id number]  Chief Complaint  Patient presents with  . Breast Cancer    HPI [name] is a 66 y.o. female.  She is referred by Dr. Norva Pavlov to the breast a multidisciplinary clinic for evaluation of a newly diagnosed invasive ductal carcinoma of the left breast, lower inner quadrant.  The patient did not perceive that she had a breast problem. Her only prior breast problems was a right breast biopsy for a benign cyst many years to get. Her screening  mammograms show a 7 mm density in the left breast at the lower inner quadrant. Ultrasound was negative. Image guided biopsy shows a grade 3 invasive ductal carcinoma with positive lymphovascular invasion, negative hormone receptors, positive HER-2. Clinical stage is T1c,  N0 because the MRI shows a solitary 1.6 cm mass in the left breast in the lower inner quadrant at the 8:30 position.  Family history is significant in that she has a paternal grandmother, a paternal uncle, numerous paternal cousins and aunts with breast cancer. There also other cancers in the family. To her knowledge, no family members have had genetic testing.  Significant medical problems or mild diet controlled diabetes, chronic anemia, past history of C. Difficile colitis, cholecystectomy, hysterectomy and BSO, and numerous laparotomies for small bowel obstruction.  She is here in the multidisciplinary clinic today with her husband and 2 sisters. HPI  Past Medical History  Diagnosis Date  . Obesity   . PUD (peptic ulcer disease)   . Anemia   . Leukopenia   . Abdominal pain   . Short bowel syndrome   . Internal hemorrhoids without mention of complication   . Stricture and stenosis of esophagus   . Hiatal hernia   . Osteoarthrosis, unspecified whether generalized or localized, unspecified site   . Thyrotoxicosis without mention of goiter or other cause, without mention of thyrotoxic  crisis or storm   . Unspecified essential hypertension   . Other and unspecified hyperlipidemia   . Gout, unspecified   . Diverticulosis of colon (without mention of hemorrhage)   . Type II or unspecified type diabetes mellitus without mention of complication, not stated as uncontrolled   . C. difficile colitis   . VITAMIN B12 DEFICIENCY 08/30/2009  . GOITER, MULTINODULAR 04/02/2009  . HYPOTHYROIDISM, POST-RADIATION 08/13/2009  . ANEMIA, IRON DEFICIENCY 05/08/2009  . ASYMPTOMATIC POSTMENOPAUSAL STATUS 10/11/2008  . Esophageal reflux 06/12/2008    Past Surgical History  Procedure Date  . Lysis adhension     multiple  . Cholecystectomy   . Vein ligation and stripping     Right leg  . Small intestine surgery     multiple  . Abdominal hysterectomy     with BSO  . Lithotripsy   . Bunionectomy     bilateral  . Colonoscopy   . Esophagogastroduodenoscopy 07/15/2005  . Thyroid ultrasound 12/1994 and 12/1995    Family History  Problem Relation Age of Onset  . Esophageal cancer Son     deceased  . Cancer Son     Esophageal Cancer  . Diabetes Mother   . Heart disease Mother   . Colon cancer Paternal Uncle   . Cancer Paternal Uncle     Colon Cancer  . Kidney disease Sister   . Diabetes Father   . Kidney disease Brother   . Kidney disease Brother   . Kidney disease Brother     Social History  History  Substance Use Topics  . Smoking status: Former Games developer  . Smokeless tobacco: Not on file  . Alcohol Use: No    Allergies  Allergen Reactions  . Aspirin     REACTION: Gi Intolerance/ Burning in stomach  . Iodine Rash    Allergic to IVP dye    Current Outpatient Prescriptions  Medication Sig Dispense Refill  . Calcium Carbonate-Vitamin D (CALCIUM-VITAMIN D) 600-200 MG-UNIT CAPS Take 1 capsule by mouth daily.        . cholestyramine (QUESTRAN) 4 G packet Questran 4grams in non carbonated liquid daily by mouth- mix in a non carbonated liquid and take midmorning 2 hours apart  from other meds  60 each  11  . cyanocobalamin (,VITAMIN B-12,) 1000 MCG/ML injection Inject 1,000 mcg into the muscle every 30 (thirty) days. Next one due the 26th of this month      . dexamethasone (DECADRON) 4 MG tablet Take 2 tablets two times a day the day before Taxotere. Then take 2 tabs two times a day starting the day after chemo for 3 days.  30 tablet  1  . levothyroxine (SYNTHROID, LEVOTHROID) 125 MCG tablet 1 TAB ONCE DAILY  30 tablet  5  . lidocaine-prilocaine (EMLA) cream Apply topically as needed.  30 g  3  . LORazepam (ATIVAN) 0.5 MG tablet Take 1 tablet (0.5 mg total) by mouth every 6 (six) hours as needed (Nausea or vomiting).  30 tablet  0  . lovastatin (MEVACOR) 20 MG tablet TAKE 1 TABLET EVERY DAY  30 tablet  5  . magnesium chloride (SLOW-MAG) 64 MG TBEC Take 2 tablets by mouth daily.        . nitroGLYCERIN (NITROSTAT) 0.4 MG SL tablet Place 1 tablet (0.4 mg total) under the tongue every 5 (five) minutes as needed for chest pain.  25 tablet  12  . omeprazole (PRILOSEC) 40 MG capsule Take 1 capsule (40 mg total) by mouth daily.  30 capsule  11  . ondansetron (ZOFRAN) 8 MG tablet Take 1 tablet two times a day starting the day after chemo for 3 days. Then take 1 tab two times a day as needed for nausea or vomiting.  30 tablet  1  . oxyCODONE-acetaminophen (PERCOCET) 5-325 MG per tablet Take 1 tablet by mouth every 6 (six) hours as needed. pain  120 tablet  0  . predniSONE (DELTASONE) 10 MG tablet Take 1 tablet (10 mg total) by mouth daily.  7 tablet  0  . prochlorperazine (COMPAZINE) 10 MG tablet Take 1 tablet (10 mg total) by mouth every 6 (six) hours as needed (Nausea or vomiting).  30 tablet  1  . prochlorperazine (COMPAZINE) 25 MG suppository Place 1 suppository (25 mg total) rectally every 12 (twelve) hours as needed for nausea.  12 suppository  3  . promethazine (PHENERGAN) 25 MG tablet TAKE 1 TABLET BY MOUTH EVERY 6 HOURS AS NEEDED FOR NAUSEA  30 tablet  1  . thiamine 100 MG  tablet Take 100 mg by mouth daily.        . trazodone (DESYREL) 300 MG tablet As directed        Review of Systems Review of Systems  Constitutional: Positive for unexpected weight change. Negative for fever and chills.  HENT: Negative for hearing loss, congestion, sore throat, trouble swallowing and voice change.   Eyes: Negative for visual disturbance.  Respiratory: Positive for shortness of breath. Negative for cough and wheezing.   Cardiovascular: Positive for  palpitations. Negative for chest pain and leg swelling.  Gastrointestinal: Negative for nausea, vomiting, abdominal pain, diarrhea, constipation, blood in stool, abdominal distention and anal bleeding.  Genitourinary: Negative for hematuria, vaginal bleeding and difficulty urinating.  Musculoskeletal: Positive for arthralgias.  Skin: Negative for rash and wound.  Neurological: Positive for headaches. Negative for seizures and syncope.  Hematological: Negative for adenopathy. Does not bruise/bleed easily.  Psychiatric/Behavioral: Negative for confusion.    There were no vitals taken for this visit.  Physical Exam Physical Exam  Constitutional: She is oriented to person, place, and time. She appears well-developed and well-nourished. No distress.  HENT:  Head: Normocephalic and atraumatic.  Nose: Nose normal.  Mouth/Throat: No oropharyngeal exudate.  Eyes: Conjunctivae and EOM are normal. Pupils are equal, round, and reactive to light. Left eye exhibits no discharge. No scleral icterus.  Neck: Neck supple. No JVD present. No tracheal deviation present. No thyromegaly present.  Cardiovascular: Normal rate, regular rhythm, normal heart sounds and intact distal pulses.   No murmur heard. Pulmonary/Chest: Effort normal and breath sounds normal. No respiratory distress. She has no wheezes. She has no rales. She exhibits no tenderness.         Small bruise at lower inner quadrant left breast. Old scar upper outer quadrant right  breast. No other skin change. No axillary adenopathy bilaterally.  Abdominal: Soft. Bowel sounds are normal. She exhibits no distension and no mass. There is no tenderness. There is no rebound and no guarding.    Musculoskeletal: She exhibits no edema and no tenderness.  Lymphadenopathy:    She has no cervical adenopathy.  Neurological: She is alert and oriented to person, place, and time. She exhibits normal muscle tone. Coordination normal.  Skin: Skin is warm. No rash noted. She is not diaphoretic. No erythema. No pallor.  Psychiatric: She has a normal mood and affect. Her behavior is normal. Judgment and thought content normal.    Data Reviewed I have discussed her case at breast conference this morning. I have reviewed her imaging studies and her pathology slides and her breast diagnostic profile. I have collaborated with Dr. Drue Second and Dr. Dorothy Puffer to develop her treatment plan.  Assessment    Invasive ductal carcinoma left breast, lower inner quadrant, receptor-negative, HER-2 positive, 1.6 cm diameter, clinical stage TI C., N0, stage I.  Very strong family history for breast cancer.  Diet-controlled diabetes  Chronic anemia  Multiple abdominal surgeries  Hyperlipidemia    Plan    I had a very long conversation with her, as did the other physicians. She initially decided she wanted a lumpectomy and sentinel node biopsy. She then changed her mind and became very adamant that she wanted a mastectomy. It was clear that she was not ready to make an informed decision and so we  convinced her to hold off on final decision making at this point in time.  She will receive genetic counseling and testing today. This may or may not strongly influence her decisions.  She will return to see me in 2 weeks for final decision making.  Ultimately she will need to have a Port-A-Cath inserted for Herceptin- based chemotherapy and definitive breast surgery. She is a candidate for a  left partial mastectomy with needle localization and sentinel node biopsy, and I advised the patient that that was my bias in her case. It is also a  standard of care to have a left mastectomy with sentinel node biopsy, with or without reconstruction. We discussed  all these options in great detail.  If she decides she wants a mastectomy will refer her to a plastic surgeon preoperative consultation.       [name]. [name], M.D., [facility] General and Minimally invasive Surgery Breast and Colorectal Surgery Office:   [phone] Pager:   [phone]  08/19/2011, 3:38 PM

## 2011-08-20 ENCOUNTER — Telehealth: Payer: Self-pay | Admitting: Oncology

## 2011-08-20 NOTE — Telephone Encounter (Signed)
I called the patient after she filled out the distress screening.  She had rated her distress at a 10.  Today she stated she feels much better.   She stated it was hard to talk about her diagnosis- but is fine today and denies feeling anxious at all.

## 2011-08-20 NOTE — Progress Notes (Addendum)
Dr.  Welton Flakes requested a consultation for genetic counseling and risk assessment for [name], a 66 y.o. female, for discussion of her personal and family history of breast cancer. She presents to clinic today to discuss the possibility of a genetic predisposition to cancer, and to further clarify her risks, as well as her family members' risks for cancer.   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    Past Medical History  Diagnosis Date  . Obesity   . PUD (peptic ulcer disease)   . Anemia   . Leukopenia   . Abdominal pain   . Short bowel syndrome   . Internal hemorrhoids without mention of complication   . Stricture and stenosis of esophagus   . Hiatal hernia   . Osteoarthrosis, unspecified whether generalized or localized, unspecified site   . Thyrotoxicosis without mention of goiter or other cause, without mention of thyrotoxic crisis or storm   . Unspecified essential hypertension   . Other and unspecified hyperlipidemia   . Gout, unspecified   . Diverticulosis of colon (without mention of hemorrhage)   . Type II or unspecified type diabetes mellitus without mention of complication, not stated as uncontrolled   . C. difficile colitis   . VITAMIN B12 DEFICIENCY 08/30/2009  . GOITER, MULTINODULAR 04/02/2009  . HYPOTHYROIDISM, POST-RADIATION 08/13/2009  . ANEMIA, IRON DEFICIENCY 05/08/2009  . ASYMPTOMATIC POSTMENOPAUSAL STATUS 10/11/2008  . Esophageal reflux 06/12/2008    Past Surgical History  Procedure Date  . Lysis adhension     multiple  . Cholecystectomy   . Vein ligation and stripping     Right leg  . Small intestine surgery     multiple  . Abdominal hysterectomy     with BSO  . Lithotripsy   . Bunionectomy     bilateral  . Colonoscopy   . Esophagogastroduodenoscopy 07/15/2005  . Thyroid ultrasound 12/1994 and 12/1995    History  Substance Use Topics    . Smoking status: Former Games developer  . Smokeless tobacco: Not on file  . Alcohol Use: No    REPRODUCTIVE HISTORY AND PERSONAL RISK ASSESSMENT FACTORS: Menarche was at age 42.   Post-menopause Uterus Intact: Yes Ovaries Intact: Yes G3P3A0 , first live birth at age 19  She has not previously undergone treatment for infertility.   OCP use: A couple months   She has used HRT in the past.    FAMILY HISTORY:  We obtained a detailed, 4-generation family history.  Significant diagnoses are listed below: Family History  Problem Relation Age of Onset  . Esophageal cancer Son     deceased  . Cancer Son     Esophageal Cancer  . Diabetes Mother   . Heart disease Mother   . Colon cancer Paternal Uncle   . Cancer Paternal Uncle     Colon Cancer  . Kidney disease Sister   . Diabetes Father   . Kidney disease Brother   . Kidney disease Brother   . Kidney disease Brother   The patient's son died of esophogeal cancer at age 96.  Ms. Keplinger had five sisters and five brothers.  Two brothers had an unknown type of kidney disease for which they had transplants, and two sisters had kidney disease, one of which was on dialysis when she died.  One niece was diagnosed with breast cancer in her 25s, and one nephew had  an unknown type of cancer.  The patient's father passed away from heart disease at age 81.  He had six sisters and three brothers.  Three sisters and one brother had breast cancer all over the age of 39.One uncle had prostate cancer and one uncle had lung cancer.  There were three aunts who did not have cancer.  The patient's paternal grandmother had breast cancer over the age of 37, and at least 10 paternal cousins had breast cancer.  The patient's maternal history of cancer includes a maternal uncle with leukemia over the age of 44.  Patient's ancestors are of [place] descent.   GENETIC COUNSELING RISK ASSESSMENT, DISCUSSION, AND SUGGESTED FOLLOW UP: We reviewed the natural  history and genetic etiology of sporadic, familial and hereditary cancer syndromes.  We discussed that 5-10% of breast cancer is hereditary and ~85% of hereditary cancer is a result of a mutation in one of the BRCA genes.  We reviewed dominant inheritance, and that 90-95% of mutations are found.  If the BRCA testing is negative, we would recommend a Breast Next panel from [facility].  The patient's personal and family history is suggestive of the following possible diagnosis: Hereditary breast cancer  We discussed that identification of a hereditary cancer syndrome may help her care providers tailor the patients medical management. If a mutation indicating a BRCA1 or BRCA2 mutation is detected in this case, the [employer] recommendations would include increased cancer surveillance and a discussion of prophylactic surgery. If a mutation is detected, the patient will be referred back to the referring provider and to any additional appropriate care providers to discuss the relevant options.   If a mutation is not found in the patient, this will decrease the likelihood of BRCA mutations as the explanation for her breast cancer; however, we would suspect that there could be a mutation that is not known yet, or possibly another mutation on a different gene that is running in the family.  Cancer surveillance options would be discussed for the patient according to the appropriate standard National Comprehensive Cancer Network and American Cancer Society guidelines, with consideration of their personal and family history risk factors. In this case, the patient will be referred back to their care providers for discussions of management.   In order to estimate her chance of having a BRCA mutation, we used statistical models (Penn II) and laboratory data that take into account her personal medical history, family history and ancestry.  Because each model is different, there can be a lot of  variability in the risks they give.  Therefore, these numbers must be considered a rough range and not a precise risk of having a BRCA mutation.  These models estimate that she has approximately a 16 (BRCA1)-31% (BRCA2) chance of having a mutation. Based on this assessment of her family and personal history, genetic testing is recommended.  After considering the risks, benefits, and limitations, the patient provided informed consent for  the following  testing:  BRCAnalysis through [facility].   Per the patient's request, we will contact her by telephone to discuss these results. A follow up genetic counseling visit will be scheduled if indicated.  The patient was seen for a total of 60 minutes, greater than 50% of which was spent face-to-face counseling.  This plan is being carried out per Dr. Welton Flakes recommendations.  This note will also be sent to the referring provider via the electronic medical record. The patient will be supplied with  a summary of this genetic counseling discussion as well as educational information on the discussed hereditary cancer syndromes following the conclusion of their visit.   Patient was discussed with Dr. Drue Second.   EDUCATIONAL INFORMATION SUPPLIED TO PATIENT AT ENCOUNTER:  Hereditary Breast and Ovarian Cancer Brochure   _______________________________________________________________________ For Office Staff:  Number of people involved in session: 5 Was an Intern/ student involved with case: not applicable     [name] [name] 1946-03-05 66 y.o. 08/24/2011 5:46 PM  CC Dr. Claud Kelp Dr. Dorothy Puffer [name], MD, MD 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 Being seen in the multidisciplinary breast clinic for discussion of treatment options.  STAGE:  Cancer of  lower-inner quadrant of female breast   Primary site: Breast   Staging method: AJCC 7th Edition   Clinical: Stage IA (T1c, N0, cM0)   Summary: Stage IA (T1c, N0, cM0)  REFERRING PHYSICIAN: Dr. Claud Kelp  HISTORY OF PRESENT ILLNESS:  SEVILLE BRICK is a 66 y.o. female.  With medical history significant for anemia leukopenia obesity peptic ulcer disease. Patient also has had prior breast biopsy of the right breast for benign cysts many years ago. She recently underwent a screening mammogram and on the left side she was found to have a 7 mm density in the lower inner quadrant. She had a image guided biopsy performed that showed an invasive ductal carcinoma with LDI grade 3 ER negative PR negative HER-2/neu positive. MRI of the breast showed a 1.6 cm mass in the left breast at the 8:30 position without any evidence of lymphadenopathy. She is now seen in the multidisciplinary clinic for discussion of treatment options. She was seen by Dr. Claud Kelp and Dr. Dorothy Puffer   Past Medical History: Past Medical History  Diagnosis Date  . Obesity   . PUD (peptic ulcer disease)   . Anemia   . Leukopenia   . Abdominal pain   . Short bowel syndrome   . Internal hemorrhoids without mention of complication   . Stricture and stenosis of esophagus   . Hiatal hernia   . Osteoarthrosis, unspecified whether generalized or localized, unspecified site   . Thyrotoxicosis without mention of goiter or other cause, without mention of thyrotoxic crisis or storm   . Unspecified essential hypertension   . Other and unspecified hyperlipidemia   . Gout, unspecified   . Diverticulosis of colon (without mention of hemorrhage)   . Type II or unspecified type diabetes mellitus without mention of complication, not stated as uncontrolled   . C. difficile colitis   . VITAMIN B12 DEFICIENCY 08/30/2009  . GOITER, MULTINODULAR 04/02/2009  . HYPOTHYROIDISM, POST-RADIATION 08/13/2009  . ANEMIA, IRON DEFICIENCY  05/08/2009  . ASYMPTOMATIC POSTMENOPAUSAL STATUS 10/11/2008  . Esophageal reflux 06/12/2008    Past Surgical History: Past Surgical History  Procedure Date  . Lysis adhension     multiple  . Cholecystectomy   . Vein ligation and stripping     Right leg  . Small intestine surgery     multiple  . Abdominal hysterectomy     with BSO  . Lithotripsy   . Bunionectomy     bilateral  . Colonoscopy   . Esophagogastroduodenoscopy 07/15/2005  . Thyroid ultrasound 12/1994 and 12/1995    Family History: Family History  Problem Relation Age of Onset  .  Esophageal cancer Son     deceased  . Cancer Son     Esophageal Cancer  . Diabetes Mother   . Heart disease Mother   . Colon cancer Paternal Uncle   . Cancer Paternal Uncle     Colon Cancer  . Kidney disease Sister   . Diabetes Father   . Kidney disease Brother   . Kidney disease Brother   . Kidney disease Brother     Social History History  Substance Use Topics  . Smoking status: Former Games developer  . Smokeless tobacco: Not on file  . Alcohol Use: No    Allergies: Allergies  Allergen Reactions  . Aspirin     REACTION: Gi Intolerance/ Burning in stomach  . Iodine Rash    Allergic to IVP dye    Current Medications: Current Outpatient Prescriptions  Medication Sig Dispense Refill  . Calcium Carbonate-Vitamin D (CALCIUM-VITAMIN D) 600-200 MG-UNIT CAPS Take 1 capsule by mouth daily.        . cholestyramine (QUESTRAN) 4 G packet Questran 4grams in non carbonated liquid daily by mouth- mix in a non carbonated liquid and take midmorning 2 hours apart from other meds  60 each  11  . cyanocobalamin (,VITAMIN B-12,) 1000 MCG/ML injection Inject 1,000 mcg into the muscle every 30 (thirty) days. Next one due the 26th of this month      . levothyroxine (SYNTHROID, LEVOTHROID) 125 MCG tablet 1 TAB ONCE DAILY  30 tablet  5  . lovastatin (MEVACOR) 20 MG tablet TAKE 1 TABLET EVERY DAY  30 tablet  5  . magnesium chloride (SLOW-MAG) 64 MG TBEC  Take 2 tablets by mouth daily.        . nitroGLYCERIN (NITROSTAT) 0.4 MG SL tablet Place 1 tablet (0.4 mg total) under the tongue every 5 (five) minutes as needed for chest pain.  25 tablet  12  . omeprazole (PRILOSEC) 40 MG capsule Take 1 capsule (40 mg total) by mouth daily.  30 capsule  11  . oxyCODONE-acetaminophen (PERCOCET) 5-325 MG per tablet Take 1 tablet by mouth every 6 (six) hours as needed. pain  120 tablet  0  . promethazine (PHENERGAN) 25 MG tablet TAKE 1 TABLET BY MOUTH EVERY 6 HOURS AS NEEDED FOR NAUSEA  30 tablet  1  . trazodone (DESYREL) 300 MG tablet As directed      . dexamethasone (DECADRON) 4 MG tablet Take 2 tablets two times a day the day before Taxotere. Then take 2 tabs two times a day starting the day after chemo for 3 days.  30 tablet  1  . lidocaine-prilocaine (EMLA) cream Apply topically as needed.  30 g  3  . LORazepam (ATIVAN) 0.5 MG tablet Take 1 tablet (0.5 mg total) by mouth every 6 (six) hours as needed (Nausea or vomiting).  30 tablet  0  . ondansetron (ZOFRAN) 8 MG tablet Take 1 tablet two times a day starting the day after chemo for 3 days. Then take 1 tab two times a day as needed for nausea or vomiting.  30 tablet  1  . predniSONE (DELTASONE) 10 MG tablet Take 1 tablet (10 mg total) by mouth daily.  7 tablet  0  . prochlorperazine (COMPAZINE) 10 MG tablet Take 1 tablet (10 mg total) by mouth every 6 (six) hours as needed (Nausea or vomiting).  30 tablet  1  . prochlorperazine (COMPAZINE) 25 MG suppository Place 1 suppository (25 mg total) rectally every 12 (twelve) hours as needed for  nausea.  12 suppository  3  . thiamine 100 MG tablet Take 100 mg by mouth daily.          OB/GYN History:Menarche at age 24 she underwent menopause in 1970 she took hormone replacement therapy from 1972 2007. She's had 3 live births beginning at the age of 1. She has had her colonoscopy as well as bone densities in 2012 she began having screening mammograms at the age of 68    ECOG PERFORMANCE STATUS: 1 - Symptomatic but completely ambulatory  Genetic Counseling/testing:Patient's history was extensively reviewed and she was seen by our genetic counselor. And her blood has been sent for comprehensive BRCA1 and 2 gene analysis  REVIEW OF SYSTEMS: Patient does complain of having night sweats weight gain insomnia and she has pains in her stomach and her legs this is cramping and muscle achiness. She's also had some blurred vision off-and-on she also notices rhinorrhea ringing in the ears and some sinus trouble. She's also noticed hoarseness of voice. She does have occasionally irregular heartbeat but palpitations and she's noticed swelling in her lower extremities she does carry a history of poor circulation. She does develop shortness of breath on walking up the stairs. She does use 2 pillows to sleep at night. She has some nausea and some lump vomiting off-and-on with some poor appetite she does get some reflux symptoms she complains of having abdominal pain she does have some urinary incontinence she does complain of some headaches she has forgetfulness at times. She does have some thyroid problems including hot flushes. And she is anemic and does on occasion have swelling of her glands. She does not have any anxiety depression hallucinations or phobia as.   PHYSICAL EXAMINATION: Blood pressure 135/87, pulse 82, temperature 97.7 F (36.5 C), height 5\' 5"  (1.651 m), weight 213 lb 6.4 oz (96.798 kg).  ZOX:WRUEA, healthy, anxious, mild distress and obese SKIN: skin color, texture, turgor are normal HEAD: Normocephalic EYES: PERRLA, EOMI, Conjunctiva are pink and non-injected EARS: External ears normal OROPHARYNX:no exudate and no erythema  NECK: supple, no adenopathy LYMPH:  no palpable lymphadenopathy, no hepatosplenomegaly BREAST:Left breast reveals area of ecchymosis from the biopsy site and there is noted to be a little bit of a hematoma and fullness. Right breast  healed surgical scar from her previous lumpectomy. LUNGS: clear to auscultation and percussion HEART: regular rate & rhythm ABDOMEN:abdomen soft, obese, normal bowel sounds and no masses or organomegaly BACK: Back symmetric, no curvature. EXTREMITIES:no edema, no clubbing, no cyanosis  NEURO: alert & oriented x 3 with fluent speech, no focal motor/sensory deficits   STUDIES/RESULTS: US Breast Left  08-15-2011  *RADIOLOGY REPORT*  Clinical Data:  Patient presents for additional views of the left breast to evaluate a possible mass.  DIGITAL DIAGNOSTIC LEFT MAMMOGRAM  AND LEFT BREAST ULTRASOUND:  Comparison:  07/29/2010, 07/18/2009 and 07/13/2008  Findings:  Spot compression images again demonstrate persistence of a 7 mm ill-defined focal asymmetry.  Ultrasound is performed, showing no definite focal abnormality over the inner lower quadrant of the left breast.  IMPRESSION: 7 mm ill-defined focal asymmetry over the inner lower left breast.  Recommendations:  Recommend stereotactic needle biopsy of this indeterminate abnormality.  BI-RADS CATEGORY 4:  Suspicious abnormality - biopsy should be considered.  Biopsy scheduled for 08/13/2011 at 09:30 a.m.  Original Report Authenticated By: Elba Barman, M.D.   Mr Breast Bilateral W Wo Contrast  08/18/2011  *RADIOLOGY REPORT*  Clinical Data: Recently diagnosed left breast invasive ductal  carcinoma with DCIS.  Preoperative evaluation.  BILATERAL BREAST MRI WITH AND WITHOUT CONTRAST  Technique: Multiplanar, multisequence MR images of both breasts were obtained prior to and following the intravenous administration of 18ml of Multihance.  Three dimensional images were evaluated at the independent DynaCad workstation.  Comparison:  08/13/2011, 08/07/2011, 07/31/2011, 07/29/2010, and 07/18/2009 mammograms.  Findings: There is an irregular enhancing mass located within the lower inner quadrant of the left breast at approximately the 08:30 o'clock position within the  middle third of the breast.  This measures 1.6 x 1.5 x 1.2 cm in size and corresponds to the recently diagnosed invasive mammary carcinoma.  There is associated clip artifact present.  This is associated with primarily plateau enhancement kinetics.  There are no other worrisome enhancing foci within either breast.  There is no evidence for axillary or internal mammary adenopathy and there are no additional findings.  IMPRESSION: 1.6 irregular enhancing mass located within the lower inner quadrant of the left breast at approximately the 08:30 o'clock position corresponding to the known invasive mammary carcinoma.  No additional findings.  THREE-DIMENSIONAL MR IMAGE RENDERING ON INDEPENDENT WORKSTATION:  Three-dimensional MR images were rendered by post-processing of the original MR data on an independent workstation.  The three- dimensional MR images were interpreted, and findings were reported in the accompanying complete MRI report for this study.  BI-RADS CATEGORY 6:  Known biopsy-proven malignancy - appropriate action should be taken.  Original Report Authenticated By: Rolla Plate, M.D.   Mm Breast Stereo Biopsy Left  08/13/2011  *RADIOLOGY REPORT*  Clinical Data:  The patient presents for stereotactic guided core biopsy of left breast asymmetry.  STEREOTACTIC-GUIDED VACUUM ASSISTED BIOPSY OF THE LEFT BREAST AND SPECIMEN RADIOGRAPH  I met with the patient and we discussed the procedure of stereotactic-guided biopsy, including benefits and alternatives. We discussed the high likelihood of a successful procedure. We discussed the risks of the procedure, including infection, bleeding, tissue injury, clip migration, and inadequate sampling. Informed, written consent was given.  Using sterile technique, 2% lidocaine, stereotactic guidance, and a 9 gauge vacuum assisted device, biopsy was performed of left breast asymmetry, using a medial approach.  At the conclusion of the procedure, a tissue marker clip was  deployed into the biopsy cavity.  Follow-up 2-view mammogram confirmed clip in the expected location.  IMPRESSION: Stereotactic-guided biopsy of left breast asymmetry.  No apparent complications.  Original Report Authenticated By: Patterson Hammersmith, M.D.   Mm Digital Diag Ltd L  08/07/2011  *RADIOLOGY REPORT*  Clinical Data:  Patient presents for additional views of the left breast to evaluate a possible mass.  DIGITAL DIAGNOSTIC LEFT MAMMOGRAM  AND LEFT BREAST ULTRASOUND:  Comparison:  07/29/2010, 07/18/2009 and 07/13/2008  Findings:  Spot compression images again demonstrate persistence of a 7 mm ill-defined focal asymmetry.  Ultrasound is performed, showing no definite focal abnormality over the inner lower quadrant of the left breast.  IMPRESSION: 7 mm ill-defined focal asymmetry over the inner lower left breast.  Recommendations:  Recommend stereotactic needle biopsy of this indeterminate abnormality.  BI-RADS CATEGORY 4:  Suspicious abnormality - biopsy should be considered.  Biopsy scheduled for 08/13/2011 at 09:30 a.m.  Original Report Authenticated By: Elba Barman, M.D.   Mm Digital Screening  08/03/2011  DG SCREEN MAMMOGRAM BILATERAL Bilateral CC and MLO view(s) were taken.  DIGITAL SCREENING MAMMOGRAM WITH CAD: The breast tissue is almost entirely fatty.  A possible mass is noted in the left breast.  Spot  compression views and  possibly sonography are recommended for further evaluation.  In the right  breast, no masses or malignant type calcifications are identified.  Compared with prior studies.  Images were processed with CAD.  IMPRESSION: Possible mass, left breast.  Additional evaluation is indicated.  The patient will be contacted for additional studies and a supplementary report will follow.  No specific mammographic evidence of  malignancy, right breast.  ASSESSMENT: Need additional imaging evaluation and/or prior mammograms for comparison - BI-RADS 0  Further imaging of the left breast. ,    Mm Radiologist Eval And Mgmt  08/14/2011  *RADIOLOGY REPORT*  *RADIOLOGY REPORT*  ESTABLISHED PATIENT OFFICE VISIT - LEVEL II ([id number])  Chief Complaint:  1 day post stereotactic left breast biopsy  History:  The patient is 1 day post stereotactic needle biopsy of asub centimeter spiculated massover the inner lower left breast. Patient states she is doing well since the biopsy.The patient was given the results of her biopsy which was compatible with grade 3 invasive ductal carcinoma with DCIS.  The pathology results are concordant with imaging findings.  The patient's immediate questions and concerns were addressed.  Exam:  Biopsy site is clean and dry without signs of hematoma or infection.  The patient remains tender at the biopsy site.  Assessment and Plan:  The patient will be contacted by the multidisciplinary cancer center for her surgical and oncological appointments.  The patient was given an appointment for breast MRI 08/18/2011 at 08:45 a.m.  The patient was also given educational materials to read at her leisure.  Original Report Authenticated By: Elba Barman, M.D.     LABS:    Chemistry      Component Value Date/Time   NA 139 08/19/2011 1214   K 3.2* 08/19/2011 1214   CL 105 08/19/2011 1214   CO2 27 08/19/2011 1214   BUN 10 08/19/2011 1214   CREATININE 0.88 08/19/2011 1214   CREATININE 0.88 03/31/2011 1550      Component Value Date/Time   CALCIUM 9.3 08/19/2011 1214   CALCIUM 9.5 05/15/2010 2153   ALKPHOS 81 08/19/2011 1214   AST 22 08/19/2011 1214   ALT 16 08/19/2011 1214   BILITOT 0.5 08/19/2011 1214      Lab Results  Component Value Date   WBC 3.7* 08/19/2011   HGB 10.5* 08/19/2011   HCT 31.0* 08/19/2011   MCV 85.2 08/19/2011   PLT 197 08/19/2011       PATHOLOGY: ADDITIONAL INFORMATION: PROGNOSTIC INDICATORS - ACIS Results IMMUNOHISTOCHEMICAL AND MORPHOMETRIC ANALYSIS BY THE AUTOMATED CELLULAR IMAGING SYSTEM (ACIS) Estrogen Receptor (Negative, <1%): 0%,  NEGATIVE Progesterone Receptor (Negative, <1%): 0%, NEGATIVE Proliferation Marker Ki67 by M IB-1 (Low<20%): 53% COMMENT: The negative hormone receptor study(ies) in this case have an internal positive control. All controls stained appropriately CHROMOGENIC IN-SITU HYBRIDIZATION Interpretation: HER2/NEU BY CISH - SHOWS AMPLIFICATION BY CISH ANALYSIS. THE RATIO OF HER2: CEP 17 SIGNALS WAS 3.09 Reference range: Ratio: HER2:CEP17 < 1.8 gene amplification not observed Ratio: HER2:CEP 17 1.8-2.2 - equivocal result Ratio: HER2:CEP17 > 2.2 - gene amplification observed FINAL DIAGNOSIS Diagnosis Breast, left, needle core biopsy, lower inner quadrant - INVASIVE DUCTAL CARCINOMA. - DUCTAL CARCINOMA IN SITU. - LYMPHOVASCULAR INVASION IS IDENTIFIED. - SEE COMMENT. 1 of 2 FINAL for SHERAH, LUND [phone]) Microscopic Comment The carcinoma is Grade III. A breast prognostic profile will be performed and the results reported separately. The results were called to [facility] on 08/14/2011. 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 Patient is status post needle core biopsy that revealed invasive ductal carcinoma grade 3 with LVI. Tumor was estrogen receptor negative progesterone receptor negative HER-2/neu amplified with a ratio of 3.09. Patient is seen in the multidisciplinary breast clinic for discussion of treatment options.  #2 patient has a significant family history with multiple family members with breast cancers as well as other malignancies.  #3 anemia and leukopenia  #4 short-bowel syndrome    PLAN:    #1 patient was seen by myself Dr. Claud Kelp and Dr. Dorothy Puffer. It was recommended patient undergo a lumpectomy with sentinel  node biopsy. Because her tumor is HER-2/neu amplified recommendation of adjuvant chemotherapy was made. Therefore a Port-A-Cath will be planned at the time of her initial surgery.  #2 patient will receive Herceptin based treatment this would most likely consist of Taxotere carboplatinum with Herceptin. Taxotere carboplatinum will be given every 3 weeks with day 2 Neulasta for a total of 4-6 cycles.The Herceptin will be given weekly for the duration of the chemotherapy and then thereafter we will change it to every 3 weeks for a total of one year. Risks and benefits of chemotherapy were discussed with the patient as well as the side effects from Herceptin.  #3 patient will need an echocardiogram as well as a Port-A-Cath and chemotherapy teaching class. All of her antiemetics prescriptions were sent to her pharmacy of choice.  #4 patient will be seen back after her surgery. All questions were answered today.     Discussion: Patient is being treated per NCCN breast cancer care guidelines appropriate for stage.1   Thank you so much for allowing me to participate in the care of [name]. I will continue to follow up the patient with you and assist in her care.  All questions were answered. The patient knows to call the clinic with any problems, questions or concerns. We can certainly see the patient much sooner if necessary.  I spent 60 minutes counseling the patient face to face. The total time spent in the appointment was 60 minutes.  Drue Second, MD Medical/Oncology [facility] [phone] (beeper) [phone] (Office)  08/24/2011, 5:46 PM 08/24/2011, 5:46 PM

## 2011-08-20 NOTE — Telephone Encounter (Signed)
S/w the pt and she is aware of her chemo educ class appt on Monday 08/24/2011. Pt aware she will be given her appt schedules at that time

## 2011-08-24 ENCOUNTER — Encounter: Payer: Self-pay | Admitting: Oncology

## 2011-08-24 ENCOUNTER — Other Ambulatory Visit: Payer: Medicare Other

## 2011-08-24 ENCOUNTER — Other Ambulatory Visit: Payer: Self-pay | Admitting: Oncology

## 2011-08-24 NOTE — Progress Notes (Signed)
Patient came in today to bring in [facility] application, patient also had questions about her coverage I called her insurance company for her to verify eligibility. I also gave patient information for Co-pay assistance foundaton ( [employer]) she will be calling them to get process started to get help with chemotherapy drugs.

## 2011-08-25 ENCOUNTER — Telehealth (INDEPENDENT_AMBULATORY_CARE_PROVIDER_SITE_OTHER): Payer: Self-pay | Admitting: General Surgery

## 2011-08-25 NOTE — Telephone Encounter (Signed)
I spoke with pt via phone. Pt advised that we have not received the genetic study test back. From [facility] clinic note she wanted to wait to make decision on the procedure until gene test came back. Pt has appt with Dr [name] on 4-10 to discuss gene test and make surgery plan. Pt is fine with this. She states she was concerned that [facility] set up chemo dates and she has not decided what surgery or when. I advised pt I will send a msg to Tami at [facility] re: the chemo dates and need to delay these until we have surgery date.

## 2011-08-25 NOTE — Telephone Encounter (Signed)
Pt advised I have reviewed her case with Dr [name] and that Chemo will be delayed until after surgery. I advised her a note has been sent to Dr Welton Flakes and to [facility] at [facility] re: this.

## 2011-08-25 NOTE — Telephone Encounter (Signed)
Message copied by Joanette Gula on Tue Aug 25, 2011  3:14 PM ------      Message from: Ernestene Mention      Created: Tue Aug 25, 2011  2:56 PM       You are correct.  Patient will eventually receive Herceptin chemotherapy and perhaps other chemotherapy at Dr. Milta Deiters discretion.  The initial plan we outlined is that she would wait for genetic testing results, then see me for definitive surgery planning which is to include a port a cath.  Chemotherapy is planned in the post op, adjuvant setting. I will be happy to see sooner than 4-10 if that would help.            Claud Kelp            ----- Message -----         From: Joanette Gula, LPN         Sent: 08/25/2011  12:59 PM           To: Ernestene Mention, MD, [facility]            Pt called today asking why the cancer center has her already scheduled for chemo when pt has not yet decided on surgery date. We are waiting on the gene study to come back for pt to decide on the procedure she wants to have. Pt has appt 4-10 to sit down and talk with Dr [name] with gene results and the surgery she wants to have. We will schedule surgery date at this appt.. I have advised pt I would send a msg to you and have this question reviewed. You may want to modify the chemo begin dates. Let me know if I can help. Thanks [employer]

## 2011-08-26 ENCOUNTER — Telehealth: Payer: Self-pay | Admitting: Oncology

## 2011-08-26 ENCOUNTER — Encounter: Payer: Self-pay | Admitting: Oncology

## 2011-08-26 ENCOUNTER — Ambulatory Visit (INDEPENDENT_AMBULATORY_CARE_PROVIDER_SITE_OTHER): Payer: Medicare Other | Admitting: *Deleted

## 2011-08-26 DIAGNOSIS — E538 Deficiency of other specified B group vitamins: Secondary | ICD-10-CM

## 2011-08-26 MED ORDER — CYANOCOBALAMIN 1000 MCG/ML IJ SOLN
1000.0000 ug | Freq: Once | INTRAMUSCULAR | Status: AC
  Administered 2011-08-26: 1000 ug via INTRAMUSCULAR

## 2011-08-26 NOTE — Telephone Encounter (Signed)
Pt had called Dr. Jacinto Halim office inquiring about her chemotherapy schedule and left a message here asking is she had cancer that had metastasized. I explained that the plan has not changed, and if her surgery schedule conflicts with her chemotherapy appointments, we will change her chemo appointments.   This schedule was made ahead to help her plan but these appointments can be changes accordingly. I explained that there is no evidence of any mets at which point she explained she had called an agency that offers financial assistance to breast cancer patients- and they asked her.  She was relieved to know that she does not have evidence of mets.    Pt verbalizes understanding.  I gave her my number should she have any more questions.

## 2011-08-26 NOTE — Progress Notes (Signed)
Patient denied [employer], for family of 2 income is [phone]

## 2011-08-31 ENCOUNTER — Encounter: Payer: Self-pay | Admitting: *Deleted

## 2011-08-31 HISTORY — PX: BREAST LUMPECTOMY: SHX2

## 2011-08-31 NOTE — Progress Notes (Signed)
Mailed after appt letter to pt. 

## 2011-08-31 NOTE — Progress Notes (Signed)
Clinical Social Worker contacted pt after she requested information on additional resources for financial assistance.  Pt previously met with the financial counselor, but did not qualify for financial assistance at [facility].  CSW and pt discussed other resources and opportunities for financial assistance.  Pt has an appointment next week to determine surgery.  CSW and pt plan to touch base after her appointment to further discuss financial resources and applications.  CSW encouraged pt to call if she has any questions or concerns.  Tamala Julian, MSW, LCSW Clinical Social Worker [facility] [phone]

## 2011-08-31 NOTE — Progress Notes (Signed)
[facility] Psychosocial Distress Screening Clinical Social Work  Clinical Social Work was referred by distress screening protocol.  The patient scored a 10 on the Psychosocial Distress Thermometer which indicates severe distress. As documented in previous note, Clinical Social Worker contacted pt at home to asses for needs and concerns.  CSW and pt discussed pt's needs and appropriate interventions were provided.  Pt did not express any urgent needs at this time.  CSW encouraged pt to call if she had any questions or concerns.      Clinical Social Worker follow up needed: yes  If yes, follow up plan: CSW and pt plan to meet following her surgery appointment to review available financial resources.    Tamala Julian, MSW, LCSW Clinical Social Worker [facility] [phone]

## 2011-09-01 ENCOUNTER — Encounter: Payer: Self-pay | Admitting: Genetic Counselor

## 2011-09-01 ENCOUNTER — Telehealth: Payer: Self-pay | Admitting: Genetic Counselor

## 2011-09-01 NOTE — Telephone Encounter (Signed)
Told patient of negative BRCA and BART testing.  She is not interested in pursuing the [place] panel at this time.

## 2011-09-03 ENCOUNTER — Ambulatory Visit (HOSPITAL_COMMUNITY)
Admission: RE | Admit: 2011-09-03 | Discharge: 2011-09-03 | Disposition: A | Discharge status: Home / Self Care | Payer: Medicare Other | Source: Ambulatory Visit | Attending: Oncology | Admitting: Oncology

## 2011-09-03 ENCOUNTER — Other Ambulatory Visit: Payer: Self-pay

## 2011-09-03 DIAGNOSIS — I379 Nonrheumatic pulmonary valve disorder, unspecified: Secondary | ICD-10-CM

## 2011-09-03 DIAGNOSIS — E785 Hyperlipidemia, unspecified: Secondary | ICD-10-CM | POA: Insufficient documentation

## 2011-09-03 DIAGNOSIS — C50319 Malignant neoplasm of lower-inner quadrant of unspecified female breast: Secondary | ICD-10-CM | POA: Insufficient documentation

## 2011-09-03 DIAGNOSIS — I10 Essential (primary) hypertension: Secondary | ICD-10-CM | POA: Insufficient documentation

## 2011-09-03 DIAGNOSIS — E039 Hypothyroidism, unspecified: Secondary | ICD-10-CM | POA: Insufficient documentation

## 2011-09-03 DIAGNOSIS — E119 Type 2 diabetes mellitus without complications: Secondary | ICD-10-CM | POA: Insufficient documentation

## 2011-09-03 MED ORDER — OXYCODONE-ACETAMINOPHEN 5-325 MG PO TABS: 1.0000 | ORAL_TABLET | Freq: Four times a day (QID) | ORAL | Status: DC | PRN

## 2011-09-03 MED ORDER — PROMETHAZINE HCL 25 MG PO TABS: 25.0000 mg | ORAL_TABLET | Freq: Three times a day (TID) | ORAL | Status: DC | PRN

## 2011-09-03 NOTE — Telephone Encounter (Signed)
Please advise in Dr Ellison's absence, thanks! 

## 2011-09-03 NOTE — Telephone Encounter (Signed)
Patient picked up hardcopies of both prescriptions at front desk.

## 2011-09-03 NOTE — Progress Notes (Signed)
  Echocardiogram 2D Echocardiogram has been performed.  [name] [facility] 09/03/2011, 10:34 AM

## 2011-09-03 NOTE — Telephone Encounter (Signed)
Both - Done hardcopy to robin  

## 2011-09-09 ENCOUNTER — Ambulatory Visit (INDEPENDENT_AMBULATORY_CARE_PROVIDER_SITE_OTHER): Payer: Medicare Other | Admitting: General Surgery

## 2011-09-09 ENCOUNTER — Encounter (INDEPENDENT_AMBULATORY_CARE_PROVIDER_SITE_OTHER): Payer: Self-pay | Admitting: General Surgery

## 2011-09-09 VITALS — BP 158/100 | HR 84 | Temp 97.4°F | Resp 18 | Ht 65.0 in | Wt 218.0 lb

## 2011-09-09 DIAGNOSIS — C50319 Malignant neoplasm of lower-inner quadrant of unspecified female breast: Secondary | ICD-10-CM

## 2011-09-09 NOTE — Patient Instructions (Signed)
You will be scheduled for surgery to treat your left breast cancer.  The surgery will be a left partial mastectomy with needle localization, left axillary sentinel lymph node biopsy, and insertion of Port-A-Cath.    Lumpectomy, Breast Conserving Surgery A lumpectomy is breast surgery that removes only part of the breast. Another name used may be partial mastectomy. The amount removed varies. Make sure you understand how much of your breast will be removed. Reasons for a lumpectomy:  Any solid breast mass.   Grouped significant nodularity that may be confused with a solitary breast mass.  Lumpectomy is the most common form of breast cancer surgery today. The surgeon removes the portion of your breast which contains the tumor (cancer). This is the lump. Some normal tissue around the lump is also removed to be sure that all the tumor has been removed.  If cancer cells are found in the margins where the breast tissue was removed, your surgeon will do more surgery to remove the remaining cancer tissue. This is called re-excision surgery. Radiation and/or chemotherapy treatments are often given following a lumpectomy to kill any cancer cells that could possibly remain.  REASONS YOU MAY NOT BE ABLE TO HAVE BREAST CONSERVING SURGERY:  The tumor is located in more than one place.   Your breast is small and the tumor is large so the breast would be disfigured.   The entire tumor removal is not successful with a lumpectomy.   You cannot commit to a full course of chemotherapy, radiation therapy or are pregnant and cannot have radiation.   You have previously had radiation to the breast to treat cancer.  HOW A LUMPECTOMY IS PERFORMED If overnight nursing is not required following a biopsy, a lumpectomy can be performed as a same-day surgery. This can be done in a hospital, clinic, or surgical center. The anesthesia used will depend on your surgeon. They will discuss this with you. A general anesthetic  keeps you sleeping through the procedure. LET YOUR CAREGIVERS KNOW ABOUT THE FOLLOWING:  Allergies   Medications taken including herbs, eye drops, over the counter medications, and creams.   Use of steroids (by mouth or creams)   Previous problems with anesthetics or Novocaine.   Possibility of pregnancy, if this applies   History of blood clots (thrombophlebitis)   History of bleeding or blood problems.   Previous surgery   Other health problems  BEFORE THE PROCEDURE You should be present one hour prior to your procedure unless directed otherwise.  AFTER THE PROCEDURE  After surgery, you will be taken to the recovery area where a nurse will watch and check your progress. Once you're awake, stable, and taking fluids well, barring other problems you will be allowed to go home.   Ice packs applied to your operative site may help with discomfort and keep the swelling down.   A small rubber drain may be placed in the breast for a couple of days to prevent a hematoma from developing in the breast.   A pressure dressing may be applied for 24 to 48 hours to prevent bleeding.   Keep the wound dry.   You may resume a normal diet and activities as directed. Avoid strenuous activities affecting the arm on the side of the biopsy site such as tennis, swimming, heavy lifting (more than 10 pounds) or pulling.   Bruising in the breast is normal following this procedure.   Wearing a bra - even to bed - may be more comfortable  and also help keep the dressing on.   Change dressings as directed.   Only take over-the-counter or prescription medicines for pain, discomfort, or fever as directed by your caregiver.  Call for your results as instructed by your surgeon. Remember it is your responsibility to get the results of your lumpectomy if your surgeon asked you to follow-up. Do not assume everything is fine if you have not heard from your caregiver. SEEK MEDICAL CARE IF:   There is increased  bleeding (more than a small spot) from the wound.   You notice redness, swelling, or increasing pain in the wound.   Pus is coming from wound.   An unexplained oral temperature above 102 F (38.9 C) develops.   You notice a foul smell coming from the wound or dressing.  SEEK IMMEDIATE MEDICAL CARE IF:   You develop a rash.   You have difficulty breathing.   You have any allergic problems.  Document Released: 06/29/2006 Document Revised: 05/07/2011 Document Reviewed: 09/30/2006 [facility] Patient Information 2012 [place], [place].

## 2011-09-09 NOTE — Progress Notes (Signed)
Patient ID: [name], female   DOB: [birth year], 66 y.o.   MRN: [id number]  Chief Complaint  Patient presents with  . Follow-up    discuss gene test    HPI [name] is a 66 y.o. female.  She returns for discussion of her left breast cancer and final decision making regarding definitive surgery.  She was originally seen in the breast multidisciplinary clinic. 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 This was 7 mm on mammogram and 1.6 cm on MRI. This is a solitary mass. It is HER-2 positive, clinical stage TI C., N0.  She had a strong family history of cancer. Her genetic testing is complete and  she is negative for any genetic mutation for breast cancer.  She knows that Dr. Welton Flakes is planning to treat her with chemotherapy. She has decided that she wants breast conservation surgery and is here to discuss that with me.  I spent about 45 minutes with the patient and her husband discussing these issues but I did not examine her. HPI  Past Medical History  Diagnosis Date  . Obesity   . PUD (peptic ulcer disease)   . Anemia   . Leukopenia   . Abdominal pain   . Short bowel syndrome   . Internal hemorrhoids without mention of complication   . Stricture and stenosis of esophagus   . Hiatal hernia   . Osteoarthrosis, unspecified whether generalized or localized, unspecified site   . Thyrotoxicosis without mention of goiter or other cause, without mention of thyrotoxic crisis or storm   . Unspecified essential hypertension   . Other and unspecified hyperlipidemia   . Gout, unspecified   . Diverticulosis of colon (without mention of hemorrhage)   . Type II or unspecified type diabetes mellitus without mention of complication, not stated as uncontrolled   . C. difficile colitis   . VITAMIN B12 DEFICIENCY 08/30/2009  . GOITER, MULTINODULAR 04/02/2009  . 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 ANEMIA, IRON DEFICIENCY 05/08/2009  . ASYMPTOMATIC POSTMENOPAUSAL STATUS 10/11/2008  . Esophageal reflux 06/12/2008    Past Surgical History  Procedure Date  . Lysis adhension     multiple  . Cholecystectomy   . Vein ligation and stripping     Right leg  . Small intestine surgery     multiple  . Abdominal hysterectomy     with BSO  . Lithotripsy   . Bunionectomy     bilateral  . Colonoscopy   . Esophagogastroduodenoscopy 07/15/2005  . Thyroid ultrasound 12/1994 and 12/1995    Family History  Problem Relation Age of Onset  . Esophageal cancer Son     deceased  . Cancer Son     Esophageal Cancer  . Diabetes Mother   . Heart disease Mother   . Colon cancer Paternal Uncle   . Cancer Paternal Uncle     Colon Cancer  . Kidney disease Sister   . Diabetes Father   . Kidney disease Brother   . Kidney disease Brother   . Kidney disease Brother     Social History History  Substance Use Topics  . Smoking status: Former Games developer  . Smokeless tobacco: Not on file  . Alcohol Use: No    Allergies  Allergen Reactions  . Aspirin     REACTION: Gi Intolerance/ Burning in stomach  . Iodine Rash    Allergic to IVP dye  Current Outpatient Prescriptions  Medication Sig Dispense Refill  . Calcium Carbonate-Vitamin D (CALCIUM-VITAMIN D) 600-200 MG-UNIT CAPS Take 1 capsule by mouth daily.        . cholestyramine (QUESTRAN) 4 G packet Questran 4grams in non carbonated liquid daily by mouth- mix in a non carbonated liquid and take midmorning 2 hours apart from other meds  60 each  11  . cyanocobalamin (,VITAMIN B-12,) 1000 MCG/ML injection Inject 1,000 mcg into the muscle every 30 (thirty) days. Next one due the 26th of this month      . dexamethasone (DECADRON) 4 MG tablet Take 2 tablets two times a day the day before Taxotere. Then take 2 tabs two times a day starting the day after chemo for 3 days.  30 tablet  1  . levothyroxine (SYNTHROID, LEVOTHROID) 125  MCG tablet 1 TAB ONCE DAILY  30 tablet  5  . lidocaine-prilocaine (EMLA) cream Apply topically as needed.  30 g  3  . LORazepam (ATIVAN) 0.5 MG tablet Take 1 tablet (0.5 mg total) by mouth every 6 (six) hours as needed (Nausea or vomiting).  30 tablet  0  . lovastatin (MEVACOR) 20 MG tablet TAKE 1 TABLET EVERY DAY  30 tablet  5  . magnesium chloride (SLOW-MAG) 64 MG TBEC Take 2 tablets by mouth daily.        . nitroGLYCERIN (NITROSTAT) 0.4 MG SL tablet Place 1 tablet (0.4 mg total) under the tongue every 5 (five) minutes as needed for chest pain.  25 tablet  12  . omeprazole (PRILOSEC) 40 MG capsule Take 1 capsule (40 mg total) by mouth daily.  30 capsule  11  . ondansetron (ZOFRAN) 8 MG tablet Take 1 tablet two times a day starting the day after chemo for 3 days. Then take 1 tab two times a day as needed for nausea or vomiting.  30 tablet  1  . oxyCODONE-acetaminophen (PERCOCET) 5-325 MG per tablet Take 1 tablet by mouth every 6 (six) hours as needed. pain  120 tablet  0  . predniSONE (DELTASONE) 10 MG tablet Take 1 tablet (10 mg total) by mouth daily.  7 tablet  0  . prochlorperazine (COMPAZINE) 10 MG tablet Take 1 tablet (10 mg total) by mouth every 6 (six) hours as needed (Nausea or vomiting).  30 tablet  1  . prochlorperazine (COMPAZINE) 25 MG suppository Place 1 suppository (25 mg total) rectally every 12 (twelve) hours as needed for nausea.  12 suppository  3  . promethazine (PHENERGAN) 25 MG tablet Take 1 tablet (25 mg total) by mouth every 8 (eight) hours as needed for nausea.  30 tablet  1  . thiamine 100 MG tablet Take 100 mg by mouth daily.        . trazodone (DESYREL) 300 MG tablet Take 1 tablet (300 mg total) by mouth at bedtime.  30 tablet  11  . trazodone (DESYREL) 300 MG tablet As directed        Review of Systems Review of Systems  Constitutional: Negative for fever, chills and unexpected weight change.  HENT: Negative for hearing loss, congestion, sore throat, trouble  swallowing and voice change.   Eyes: Negative for visual disturbance.  Respiratory: Negative for cough and wheezing.   Cardiovascular: Negative for chest pain, palpitations and leg swelling.  Gastrointestinal: Negative for nausea, vomiting, abdominal pain, diarrhea, constipation, blood in stool, abdominal distention and anal bleeding.  Genitourinary: Negative for hematuria, vaginal bleeding and difficulty urinating.  Musculoskeletal: Negative for  arthralgias.  Skin: Negative for rash and wound.  Neurological: Negative for seizures, syncope and headaches.  Hematological: Negative for adenopathy. Does not bruise/bleed easily.  Psychiatric/Behavioral: Negative for confusion. The patient is nervous/anxious.     Blood pressure 158/100, pulse 84, temperature 97.4 F (36.3 C), resp. rate 18, height 5\' 5"  (1.651 m), weight 218 lb (98.884 kg).  Physical Exam Physical Exam  Data Reviewed I reviewed my office notes from the breast clinic on March 20. I reviewed the genetic testing. I reviewed Dr. Feliz Beam notes.  Assessment    Invasive ductal carcinoma left breast, lower inner quadrant, receptor-negative, HER-2 positive, 1.6 cm diameter, clinical stage TI C., N0, stage I.  Genetic testing negative.  Very strong family history for breast cancer.   Diet-controlled diabetes  Chronic anemia  Multiple abdominal surgeries  Hyperlipidemia     Plan    We had a long discussion about breast conservation surgery. I discussed the indications details and techniques of partial mastectomy with needle localization, sentinel lymph node biopsy, and Port-A-Cath insertion with fluoroscopy. Risks and complications were discussed, including but not limited to bleeding, infection, Port-A-Cath malfunction, pneumothorax, reoperation for positive margins, reoperation for positive nodes, arm swelling, or numbness, cosmetic deformity, and other unforeseen problems. She and her husband understand these issues.  Their questions were answered. They agree with this plan.  She'll be scheduled for left partial mastectomy with needle localization, left axillary sentinel node biopsy, and Port-A-Cath insertion with fluoroscopy.       [name]. [name], M.D., [facility] General and Minimally invasive Surgery Breast and Colorectal Surgery Office:   [phone] Pager:   [phone]  09/09/2011, 4:48 PM

## 2011-09-10 ENCOUNTER — Encounter: Payer: Self-pay | Admitting: *Deleted

## 2011-09-10 ENCOUNTER — Telehealth: Payer: Self-pay | Admitting: *Deleted

## 2011-09-10 NOTE — Telephone Encounter (Signed)
per orders from 09-10-2011 cancelling the lab md chemo for 09-15-2011 move to the first week in May

## 2011-09-10 NOTE — Telephone Encounter (Signed)
Pt called inquired about chemo start date.  Pt advised her surgery with Dr. [name] scheduled 09/29/11. !st Chemo scheduled on 04/16    Reviewed with MD.  Tawanna Cooler to be rescheduled- to start  1st week of May.  Appt on 09/15/11 --LAB/MD/Infusion to be cancelled and rescheduled For next available appt 1st week of may.  Scheduler to call pt and advise new date and time of appt.  Onc tx sent

## 2011-09-11 ENCOUNTER — Other Ambulatory Visit (INDEPENDENT_AMBULATORY_CARE_PROVIDER_SITE_OTHER): Payer: Self-pay | Admitting: General Surgery

## 2011-09-11 DIAGNOSIS — C50319 Malignant neoplasm of lower-inner quadrant of unspecified female breast: Secondary | ICD-10-CM

## 2011-09-14 ENCOUNTER — Encounter (HOSPITAL_COMMUNITY): Payer: Self-pay | Admitting: Pharmacy Technician

## 2011-09-15 ENCOUNTER — Telehealth: Payer: Self-pay | Admitting: *Deleted

## 2011-09-15 ENCOUNTER — Telehealth: Payer: Self-pay | Admitting: Oncology

## 2011-09-15 ENCOUNTER — Ambulatory Visit: Payer: Medicare Other

## 2011-09-15 ENCOUNTER — Encounter: Payer: Self-pay | Admitting: *Deleted

## 2011-09-15 ENCOUNTER — Other Ambulatory Visit: Payer: Medicare Other | Admitting: Lab

## 2011-09-15 ENCOUNTER — Ambulatory Visit: Payer: Medicare Other | Admitting: Oncology

## 2011-09-15 NOTE — Progress Notes (Signed)
Clinical Social Worker met with pt and pt's husband in [place] office to offer support and review financial resources.  CSW and pt discussed financial concerns, and the additional stress caused by medical bills.  CSW and pt reviewed multiple financial assistance programs and applications.  Pt is scheduled to have surgery on 09/29/11.  CSW and pt decided it was in the pt's best interest to wait until after surgery to submit applications.  This will allow pt to include all medical bills post surgery.  CSW did encouraged pt to initiate the application process with [facility] co-pay and financial assistance programs.  CSW also informed pt of the ACS transportation  assistance, and pt was agreeable to CSW making a referral.  CSW and pt recorded the information and documentation required to complete financial resource applications.  Pt plans to obtain all information/documents and contact CSW post surgery to complete applications.  CSW encouraged pt to call with any questions or concerns.    Tamala Julian, MSW, LCSW Clinical Social Worker [facility] [phone]

## 2011-09-15 NOTE — Telephone Encounter (Signed)
gve the pt her may 2013 appt calendar 

## 2011-09-15 NOTE — Telephone Encounter (Signed)
Pt return call regarding r/s appt with Dr. Welton Flakes.  Gave pt new date and time for appt with Dr. Welton Flakes on 5/10.  Received verbal understanding.  Pt denies further needs or questions at this time.  Encourage pt to call with concerns.  Contact information given.

## 2011-09-15 NOTE — Telephone Encounter (Signed)
S/w dawn stuart the breast navaigator to cancel all of the pt's April thru June 2013 appts due to the pt is having surgery. Left the may 10th appt per dawn only

## 2011-09-15 NOTE — Telephone Encounter (Signed)
Left vm for pt to return call regarding re-scheduling f/u appt with Dr. Welton Flakes.  I have mailed a copy of her new schedule to her.

## 2011-09-16 ENCOUNTER — Ambulatory Visit: Payer: Medicare Other

## 2011-09-21 ENCOUNTER — Other Ambulatory Visit: Payer: Self-pay | Admitting: Certified Registered Nurse Anesthetist

## 2011-09-22 ENCOUNTER — Ambulatory Visit: Payer: Medicare Other

## 2011-09-23 ENCOUNTER — Encounter (HOSPITAL_COMMUNITY)
Admission: RE | Admit: 2011-09-23 | Discharge: 2011-09-23 | Disposition: A | Discharge status: Home / Self Care | Payer: Medicare Other | Source: Ambulatory Visit | Attending: General Surgery | Admitting: General Surgery

## 2011-09-23 ENCOUNTER — Encounter (HOSPITAL_COMMUNITY): Payer: Self-pay

## 2011-09-23 HISTORY — DX: Encounter for other specified aftercare: Z51.89

## 2011-09-23 HISTORY — DX: Other specified postprocedural states: Z98.890

## 2011-09-23 HISTORY — DX: Nausea with vomiting, unspecified: R11.2

## 2011-09-23 HISTORY — DX: Urinary tract infection, site not specified: N39.0

## 2011-09-23 HISTORY — DX: Reserved for inherently not codable concepts without codable children: IMO0001

## 2011-09-23 HISTORY — DX: Asymptomatic varicose veins of unspecified lower extremity: I83.90

## 2011-09-23 LAB — URINALYSIS, ROUTINE W REFLEX MICROSCOPIC
Glucose, UA: NEGATIVE mg/dL
Hgb urine dipstick: NEGATIVE
Ketones, ur: NEGATIVE mg/dL
pH: 5.5 (ref 5.0–8.0)

## 2011-09-23 LAB — COMPREHENSIVE METABOLIC PANEL
ALT: 25 U/L (ref 0–35)
AST: 29 U/L (ref 0–37)
CO2: 25 mEq/L (ref 19–32)
Calcium: 9.8 mg/dL (ref 8.4–10.5)
GFR calc non Af Amer: 66 mL/min — ABNORMAL LOW (ref >90)
Sodium: 138 mEq/L (ref 135–145)
Total Protein: 7.2 g/dL (ref 6.0–8.3)

## 2011-09-23 LAB — CBC
MCH: 29 pg (ref 26.0–34.0)
Platelets: 245 10*3/uL (ref 150–400)
RBC: 3.83 MIL/uL — ABNORMAL LOW (ref 3.87–5.11)

## 2011-09-23 LAB — DIFFERENTIAL
Eosinophils Relative: 1 % (ref 0–5)
Lymphocytes Relative: 44 % (ref 12–46)
Lymphs Abs: 2.4 10*3/uL (ref 0.7–4.0)
Monocytes Absolute: 0.4 10*3/uL (ref 0.1–1.0)

## 2011-09-23 LAB — URINE MICROSCOPIC-ADD ON

## 2011-09-23 MED ORDER — CHLORHEXIDINE GLUCONATE 4 % EX LIQD: 1.0000 "application " | Freq: Once | CUTANEOUS | Status: DC

## 2011-09-23 NOTE — Progress Notes (Signed)
Echo 4.13, stress test 11.12, ekg 9.12 in epic pre test for chemo

## 2011-09-23 NOTE — Pre-Procedure Instructions (Addendum)
20 [name]  09/23/2011   Your procedure is scheduled on:  4.30.13  Report to [facility] at 930 AM. Or when done at breast center  Call this number if you have problems the morning of surgery: [phone]   Remember:   Do not eat food:After Midnight.  May have clear liquids: up to 4 Hours before arrival. 530 am  Clear liquids include soda, tea, black coffee, apple or grape juice, broth.  Take these medicines the morning of surgery with A SIP OF WATER: flexeril, levothyroxine,omeprazole, promethazine, pain med  STOP any aspirin, nsaids, blood thinners, herbal meds today   Do not wear jewelry, make-up or nail polish.  Do not wear lotions, powders, or perfumes. You may wear deodorant.  Do not shave 48 hours prior to surgery.  Do not bring valuables to the hospital.  Contacts, dentures or bridgework may not be worn into surgery.  Leave suitcase in the car. After surgery it may be brought to your room.  For patients admitted to the hospital, checkout time is 11:00 AM the day of discharge.   Patients discharged the day of surgery will not be allowed to drive home.  Name and phone number of your driver: [place] spouse [phone]  Special Instructions: CHG Shower Use Special Wash: 1/2 bottle night before surgery and 1/2 bottle morning of surgery.   Please read over the following fact sheets that you were given: Pain Booklet, Coughing and Deep Breathing, MRSA Information and Surgical Site Infection Prevention

## 2011-09-25 ENCOUNTER — Ambulatory Visit (INDEPENDENT_AMBULATORY_CARE_PROVIDER_SITE_OTHER): Payer: Medicare Other | Admitting: *Deleted

## 2011-09-25 DIAGNOSIS — E538 Deficiency of other specified B group vitamins: Secondary | ICD-10-CM

## 2011-09-25 DIAGNOSIS — E119 Type 2 diabetes mellitus without complications: Secondary | ICD-10-CM

## 2011-09-25 MED ORDER — CYANOCOBALAMIN 1000 MCG/ML IJ SOLN
1000.0000 ug | Freq: Once | INTRAMUSCULAR | Status: AC
  Administered 2011-09-25: 1000 ug via INTRAMUSCULAR

## 2011-09-25 MED ORDER — CYANOCOBALAMIN 1000 MCG/ML IJ SOLN: 1000.0000 ug | Freq: Once | INTRAMUSCULAR | Status: DC

## 2011-09-28 MED ORDER — CEFAZOLIN SODIUM-DEXTROSE 2-3 GM-% IV SOLR
2.0000 g | INTRAVENOUS | Status: AC
  Administered 2011-09-29: 2 g via INTRAVENOUS
  Filled 2011-09-28: qty 50

## 2011-09-28 NOTE — H&P (Addendum)
[name]    MRN: [id number]   Description: 66 year old female  Provider: Ernestene Mention, MD  Department: [facility]      Diagnoses     Cancer of lower-inner quadrant of female breast   - Primary    174.3      Reason for Visit     Follow-up    discuss gene test     Vitals    BP Pulse Temp Resp Ht Wt    158/100  84  97.4 F (36.3 C)  18  5\' 5"  (1.651 m)  218 lb (98.884 kg)    BMI - 36.28 kg/m2                 History and Physical   Ernestene Mention, MD  Patient ID: [name], female   DOB: 05-14-1946, 66 y.o.   MRN: [id number] t          HPI [name] is a 66 y.o. female.  She returns for discussion of her left breast cancer and final decision making regarding definitive surgery.  She was originally seen in the breast multidisciplinary clinic. 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 This was 7 mm on mammogram and 1.6 cm on MRI. This is a solitary mass. It is HER-2 positive, clinical stage TI C., N0.  She had a strong family history of cancer. Her genetic testing is complete and  she is negative for any genetic mutation for breast cancer.  She knows that Dr. Welton Flakes is planning to treat her with chemotherapy. She has decided that she wants breast conservation surgery and is here to discuss that with me.  I spent about 45 minutes with the patient and her husband discussing these issues but I did not examine her.     Past Medical History   Diagnosis  Date   .  Obesity     .  PUD (peptic ulcer disease)     .  Anemia     .  Leukopenia     .  Abdominal pain     .  Short bowel syndrome     .  Internal hemorrhoids without mention of complication     .  Stricture and stenosis of esophagus     .  Hiatal hernia     .  Osteoarthrosis, unspecified whether generalized or localized, unspecified site     .  Thyrotoxicosis without mention  of goiter or other cause, without mention of thyrotoxic crisis or storm     .  Unspecified essential hypertension     .  Other and unspecified hyperlipidemia     .  Gout, unspecified     .  Diverticulosis of colon (without mention of hemorrhage)     .  Type II or unspecified type diabetes mellitus without mention of complication, not stated as uncontrolled     .  C. difficile colitis     .  VITAMIN B12 DEFICIENCY  08/30/2009   .  GOITER, MULTINODULAR  04/02/2009   .  HYPOTHYROIDISM, POST-RADIATION  08/13/2009   .  ANEMIA, IRON DEFICIENCY  05/08/2009   .  ASYMPTOMATIC POSTMENOPAUSAL STATUS  10/11/2008   .  Esophageal reflux  06/12/2008       Past Surgical History   Procedure  Date   .  Lysis adhension  multiple   .  Cholecystectomy     .  Vein ligation and stripping         Right leg   .  Small intestine surgery         multiple   .  Abdominal hysterectomy         with BSO   .  Lithotripsy     .  Bunionectomy         bilateral   .  Colonoscopy     .  Esophagogastroduodenoscopy  07/15/2005   .  Thyroid ultrasound  12/1994 and 12/1995       Family History   Problem  Relation  Age of Onset   .  Esophageal cancer  Son         deceased   .  Cancer  Son         Esophageal Cancer   .  Diabetes  Mother     .  Heart disease  Mother     .  Colon cancer  Paternal Uncle     .  Cancer  Paternal Uncle         Colon Cancer   .  Kidney disease  Sister     .  Diabetes  Father     .  Kidney disease  Brother     .  Kidney disease  Brother     .  Kidney disease  Brother        Social History History   Substance Use Topics   .  Smoking status:  Former Games developer   .  Smokeless tobacco:  Not on file   .  Alcohol Use:  No       Allergies   Allergen  Reactions   .  Aspirin         REACTION: Gi Intolerance/ Burning in stomach   .  Iodine  Rash       Allergic to IVP dye       Current Outpatient Prescriptions   Medication  Sig  Dispense  Refill   .  Calcium Carbonate-Vitamin D  (CALCIUM-VITAMIN D) 600-200 MG-UNIT CAPS  Take 1 capsule by mouth daily.           .  cholestyramine (QUESTRAN) 4 G packet  Questran 4grams in non carbonated liquid daily by mouth- mix in a non carbonated liquid and take midmorning 2 hours apart from other meds   60 each   11   .  cyanocobalamin (,VITAMIN B-12,) 1000 MCG/ML injection  Inject 1,000 mcg into the muscle every 30 (thirty) days. Next one due the 26th of this month         .  dexamethasone (DECADRON) 4 MG tablet  Take 2 tablets two times a day the day before Taxotere. Then take 2 tabs two times a day starting the day after chemo for 3 days.   30 tablet   1   .  levothyroxine (SYNTHROID, LEVOTHROID) 125 MCG tablet  1 TAB ONCE DAILY   30 tablet   5   .  lidocaine-prilocaine (EMLA) cream  Apply topically as needed.   30 g   3   .  LORazepam (ATIVAN) 0.5 MG tablet  Take 1 tablet (0.5 mg total) by mouth every 6 (six) hours as needed (Nausea or vomiting).   30 tablet   0   .  lovastatin (MEVACOR) 20 MG tablet  TAKE 1 TABLET EVERY DAY   30 tablet  5   .  magnesium chloride (SLOW-MAG) 64 MG TBEC  Take 2 tablets by mouth daily.           .  nitroGLYCERIN (NITROSTAT) 0.4 MG SL tablet  Place 1 tablet (0.4 mg total) under the tongue every 5 (five) minutes as needed for chest pain.   25 tablet   12   .  omeprazole (PRILOSEC) 40 MG capsule  Take 1 capsule (40 mg total) by mouth daily.   30 capsule   11   .  ondansetron (ZOFRAN) 8 MG tablet  Take 1 tablet two times a day starting the day after chemo for 3 days. Then take 1 tab two times a day as needed for nausea or vomiting.   30 tablet   1   .  oxyCODONE-acetaminophen (PERCOCET) 5-325 MG per tablet  Take 1 tablet by mouth every 6 (six) hours as needed. pain   120 tablet   0   .  predniSONE (DELTASONE) 10 MG tablet  Take 1 tablet (10 mg total) by mouth daily.   7 tablet   0   .  prochlorperazine (COMPAZINE) 10 MG tablet  Take 1 tablet (10 mg total) by mouth every 6 (six) hours as needed (Nausea or  vomiting).   30 tablet   1   .  prochlorperazine (COMPAZINE) 25 MG suppository  Place 1 suppository (25 mg total) rectally every 12 (twelve) hours as needed for nausea.   12 suppository   3   .  promethazine (PHENERGAN) 25 MG tablet  Take 1 tablet (25 mg total) by mouth every 8 (eight) hours as needed for nausea.   30 tablet   1   .  thiamine 100 MG tablet  Take 100 mg by mouth daily.           .  trazodone (DESYREL) 300 MG tablet  Take 1 tablet (300 mg total) by mouth at bedtime.   30 tablet   11   .  trazodone (DESYREL) 300 MG tablet  As directed            Review of Systems  Constitutional: Negative for fever, chills and unexpected weight change.  HENT: Negative for hearing loss, congestion, sore throat, trouble swallowing and voice change.   Eyes: Negative for visual disturbance.  Respiratory: Negative for cough and wheezing.   Cardiovascular: Negative for chest pain, palpitations and leg swelling.  Gastrointestinal: Negative for nausea, vomiting, abdominal pain, diarrhea, constipation, blood in stool, abdominal distention and anal bleeding.  Genitourinary: Negative for hematuria, vaginal bleeding and difficulty urinating.  Musculoskeletal: Negative for arthralgias.  Skin: Negative for rash and wound.  Neurological: Negative for seizures, syncope and headaches.  Hematological: Negative for adenopathy. Does not bruise/bleed easily.  Psychiatric/Behavioral: Negative for confusion. The patient is nervous/anxious.     Blood pressure 158/100, pulse 84, temperature 97.4 F (36.3 C), resp. rate 18, height 5\' 5"  (1.651 m), weight 218 lb (98.884 kg).  Physical Exam  Constitutional: She is oriented to person, place, and time. She appears well-developed and well-nourished. No distress.  HENT:  Head: Normocephalic and atraumatic.  Nose: Nose normal.  Mouth/Throat: No oropharyngeal exudate.  Eyes: Conjunctivae and EOM are normal. Pupils are equal, round, and reactive to light. Left eye  exhibits no discharge. No scleral icterus.  Neck: Neck supple. No JVD present. No tracheal deviation present. No thyromegaly present.  Cardiovascular: Normal rate, regular rhythm, normal heart sounds and intact distal pulses.  No murmur heard.  Pulmonary/Chest: Effort normal and breath sounds normal. No respiratory distress. She has no wheezes. She has no rales. She exhibits no tenderness.    Small bruise at lower inner quadrant left breast. Old scar upper outer quadrant right breast. No other skin change. No axillary adenopathy bilaterally.  Abdominal: Soft. Bowel sounds are normal. She exhibits no distension and no mass. There is no tenderness. There is no rebound and no guarding.  Multiple scars.  Musculoskeletal: She exhibits no edema and no tenderness.  Lymphadenopathy:  She has no cervical adenopathy.  Neurological: She is alert and oriented to person, place, and time. She exhibits normal muscle tone. Coordination normal.  Skin: Skin is warm. No rash noted. She is not diaphoretic. No erythema. No pallor.  Psychiatric: She has a normal mood and affect. Her behavior is normal. Judgment and thought content normal     Data Reviewed I reviewed my office notes from the breast clinic on March 20. I reviewed the genetic testing. I reviewed Dr. Feliz Beam notes.   Assessment   Invasive ductal carcinoma left breast, lower inner quadrant, receptor-negative, HER-2 positive, 1.6 cm diameter, clinical stage TI C., N0, stage I.  Genetic testing negative.   Very strong family history for breast cancer.    Diet-controlled diabetes   Chronic anemia   Multiple abdominal surgeries   Hyperlipidemia     Plan We had a long discussion about breast conservation surgery. I discussed the indications details and techniques of partial mastectomy with needle localization, sentinel lymph node biopsy, and Port-A-Cath insertion with fluoroscopy. Risks and complications were discussed, including  but not limited to bleeding, infection, Port-A-Cath malfunction, pneumothorax, reoperation for positive margins, reoperation for positive nodes, arm swelling, or numbness, cosmetic deformity, and other unforeseen problems. She and her husband understand these issues. Their questions were answered. They agree with this plan.   She'll be scheduled for left partial mastectomy with needle localization, left axillary sentinel node biopsy, and Port-A-Cath insertion with fluoroscopy.       [name]. [name], M.D., [facility] General and Minimally invasive Surgery Breast and Colorectal Surgery Office:   [phone] Pager:   [phone]

## 2011-09-29 ENCOUNTER — Ambulatory Visit: Payer: Medicare Other

## 2011-09-29 ENCOUNTER — Ambulatory Visit (HOSPITAL_COMMUNITY): Payer: Medicare Other

## 2011-09-29 ENCOUNTER — Encounter (HOSPITAL_COMMUNITY): Payer: Self-pay | Admitting: Anesthesiology

## 2011-09-29 ENCOUNTER — Ambulatory Visit (HOSPITAL_COMMUNITY)
Admission: RE | Admit: 2011-09-29 | Discharge: 2011-09-29 | Disposition: A | Discharge status: Home / Self Care | Payer: Medicare Other | Source: Ambulatory Visit | Attending: General Surgery | Admitting: General Surgery

## 2011-09-29 ENCOUNTER — Ambulatory Visit (HOSPITAL_COMMUNITY): Payer: Medicare Other | Admitting: Anesthesiology

## 2011-09-29 ENCOUNTER — Ambulatory Visit (HOSPITAL_COMMUNITY)
Admission: RE | Admit: 2011-09-29 | Discharge: 2011-09-30 | Disposition: A | Discharge status: Home / Self Care | Payer: Medicare Other | Source: Ambulatory Visit | Attending: General Surgery | Admitting: General Surgery

## 2011-09-29 ENCOUNTER — Encounter (HOSPITAL_COMMUNITY): Payer: Self-pay | Admitting: General Practice

## 2011-09-29 ENCOUNTER — Encounter (HOSPITAL_COMMUNITY)
Admission: RE | Disposition: A | Discharge status: Home / Self Care | Payer: Self-pay | Source: Ambulatory Visit | Attending: General Surgery

## 2011-09-29 ENCOUNTER — Ambulatory Visit
Admission: RE | Admit: 2011-09-29 | Discharge: 2011-09-29 | Disposition: A | Discharge status: Home / Self Care | Payer: Medicare Other | Source: Ambulatory Visit | Attending: General Surgery | Admitting: General Surgery

## 2011-09-29 DIAGNOSIS — C50319 Malignant neoplasm of lower-inner quadrant of unspecified female breast: Secondary | ICD-10-CM

## 2011-09-29 DIAGNOSIS — Z01812 Encounter for preprocedural laboratory examination: Secondary | ICD-10-CM | POA: Insufficient documentation

## 2011-09-29 DIAGNOSIS — Z01818 Encounter for other preprocedural examination: Secondary | ICD-10-CM | POA: Insufficient documentation

## 2011-09-29 DIAGNOSIS — C50919 Malignant neoplasm of unspecified site of unspecified female breast: Secondary | ICD-10-CM

## 2011-09-29 DIAGNOSIS — E039 Hypothyroidism, unspecified: Secondary | ICD-10-CM | POA: Insufficient documentation

## 2011-09-29 DIAGNOSIS — K219 Gastro-esophageal reflux disease without esophagitis: Secondary | ICD-10-CM | POA: Insufficient documentation

## 2011-09-29 DIAGNOSIS — Z87891 Personal history of nicotine dependence: Secondary | ICD-10-CM | POA: Insufficient documentation

## 2011-09-29 DIAGNOSIS — E119 Type 2 diabetes mellitus without complications: Secondary | ICD-10-CM | POA: Insufficient documentation

## 2011-09-29 DIAGNOSIS — I10 Essential (primary) hypertension: Secondary | ICD-10-CM | POA: Insufficient documentation

## 2011-09-29 DIAGNOSIS — R443 Hallucinations, unspecified: Secondary | ICD-10-CM | POA: Insufficient documentation

## 2011-09-29 DIAGNOSIS — D0590 Unspecified type of carcinoma in situ of unspecified breast: Secondary | ICD-10-CM

## 2011-09-29 DIAGNOSIS — K449 Diaphragmatic hernia without obstruction or gangrene: Secondary | ICD-10-CM | POA: Insufficient documentation

## 2011-09-29 HISTORY — DX: Personal history of other diseases of the digestive system: Z87.19

## 2011-09-29 HISTORY — PX: MASTECTOMY W/ NODES PARTIAL: SUR854

## 2011-09-29 HISTORY — DX: Migraine, unspecified, not intractable, without status migrainosus: G43.909

## 2011-09-29 HISTORY — DX: Malignant neoplasm of unspecified site of unspecified female breast: C50.919

## 2011-09-29 HISTORY — DX: Angina pectoris, unspecified: I20.9

## 2011-09-29 HISTORY — DX: Shortness of breath: R06.02

## 2011-09-29 HISTORY — DX: Calculus of kidney: N20.0

## 2011-09-29 HISTORY — PX: PORTACATH PLACEMENT: SHX2246

## 2011-09-29 HISTORY — PX: BREAST LUMPECTOMY W/ NEEDLE LOCALIZATION: SHX1266

## 2011-09-29 SURGERY — PARTIAL MASTECTOMY WITH NEEDLE LOCALIZATION AND AXILLARY SENTINEL LYMPH NODE BX
Anesthesia: General | Site: Chest | Wound class: Clean

## 2011-09-29 MED ORDER — HYDROMORPHONE HCL PF 1 MG/ML IJ SOLN
INTRAMUSCULAR | Status: AC
  Administered 2011-09-29: 0.5 mg via INTRAVENOUS
  Filled 2011-09-29: qty 1

## 2011-09-29 MED ORDER — HEPARIN SODIUM (PORCINE) 5000 UNIT/ML IJ SOLN
5000.0000 [IU] | Freq: Once | INTRAMUSCULAR | Status: AC
  Administered 2011-09-29: 5000 [IU] via SUBCUTANEOUS

## 2011-09-29 MED ORDER — TECHNETIUM TC 99M SULFUR COLLOID FILTERED
1.0000 | Freq: Once | INTRAVENOUS | Status: AC | PRN
  Administered 2011-09-29: 1 via INTRADERMAL

## 2011-09-29 MED ORDER — SODIUM CHLORIDE 0.9 % IR SOLN
Status: DC | PRN
  Administered 2011-09-29: 12:00:00

## 2011-09-29 MED ORDER — SODIUM CHLORIDE 0.9 % IV SOLN: INTRAVENOUS | Status: DC

## 2011-09-29 MED ORDER — LIDOCAINE HCL (CARDIAC) 20 MG/ML IV SOLN
INTRAVENOUS | Status: DC | PRN
  Administered 2011-09-29: 30 mg via INTRAVENOUS

## 2011-09-29 MED ORDER — ONDANSETRON HCL 4 MG/2ML IJ SOLN
INTRAMUSCULAR | Status: DC | PRN
  Administered 2011-09-29: 4 mg via INTRAVENOUS

## 2011-09-29 MED ORDER — OXYCODONE-ACETAMINOPHEN 5-325 MG PO TABS
2.0000 | ORAL_TABLET | ORAL | Status: AC | PRN
  Administered 2011-09-29: 2 via ORAL

## 2011-09-29 MED ORDER — FENTANYL CITRATE 0.05 MG/ML IJ SOLN: 50.0000 ug | INTRAMUSCULAR | Status: DC | PRN

## 2011-09-29 MED ORDER — PROPOFOL 10 MG/ML IV EMUL
INTRAVENOUS | Status: DC | PRN
  Administered 2011-09-29: 200 mg via INTRAVENOUS

## 2011-09-29 MED ORDER — ONDANSETRON HCL 4 MG/2ML IJ SOLN
4.0000 mg | Freq: Four times a day (QID) | INTRAMUSCULAR | Status: DC | PRN
  Administered 2011-09-29: 4 mg via INTRAVENOUS

## 2011-09-29 MED ORDER — OXYCODONE-ACETAMINOPHEN 5-325 MG PO TABS: 2.0000 | ORAL_TABLET | ORAL | Status: DC | PRN

## 2011-09-29 MED ORDER — SODIUM CHLORIDE 0.9 % IJ SOLN: 3.0000 mL | Freq: Two times a day (BID) | INTRAMUSCULAR | Status: DC

## 2011-09-29 MED ORDER — OXYCODONE-ACETAMINOPHEN 5-325 MG PO TABS
ORAL_TABLET | ORAL | Status: AC
  Filled 2011-09-29: qty 2

## 2011-09-29 MED ORDER — SODIUM CHLORIDE 0.9 % IV SOLN: 250.0000 mL | INTRAVENOUS | Status: DC | PRN

## 2011-09-29 MED ORDER — HYDROMORPHONE HCL PF 1 MG/ML IJ SOLN
0.5000 mg | INTRAMUSCULAR | Status: AC | PRN
  Administered 2011-09-29 (×4): 0.5 mg via INTRAVENOUS

## 2011-09-29 MED ORDER — KETOROLAC TROMETHAMINE 30 MG/ML IJ SOLN
30.0000 mg | Freq: Four times a day (QID) | INTRAMUSCULAR | Status: AC
  Administered 2011-09-29 – 2011-09-30 (×2): 30 mg via INTRAVENOUS
  Filled 2011-09-29 (×2): qty 1

## 2011-09-29 MED ORDER — SUCCINYLCHOLINE CHLORIDE 20 MG/ML IJ SOLN
INTRAMUSCULAR | Status: DC | PRN
  Administered 2011-09-29: 120 mg via INTRAVENOUS

## 2011-09-29 MED ORDER — FENTANYL CITRATE 0.05 MG/ML IJ SOLN
INTRAMUSCULAR | Status: DC | PRN
  Administered 2011-09-29: 150 ug via INTRAVENOUS
  Administered 2011-09-29: 100 ug via INTRAVENOUS

## 2011-09-29 MED ORDER — ACETAMINOPHEN 650 MG RE SUPP: 650.0000 mg | RECTAL | Status: DC | PRN

## 2011-09-29 MED ORDER — BUPIVACAINE-EPINEPHRINE PF 0.5-1:200000 % IJ SOLN
INTRAMUSCULAR | Status: DC | PRN
  Administered 2011-09-29: 4 mL

## 2011-09-29 MED ORDER — ONDANSETRON HCL 4 MG/2ML IJ SOLN
INTRAMUSCULAR | Status: AC
  Administered 2011-09-29: 4 mg via INTRAVENOUS
  Filled 2011-09-29: qty 2

## 2011-09-29 MED ORDER — MORPHINE SULFATE 2 MG/ML IJ SOLN: 2.0000 mg | INTRAMUSCULAR | Status: DC | PRN

## 2011-09-29 MED ORDER — KETOROLAC TROMETHAMINE 30 MG/ML IJ SOLN
INTRAMUSCULAR | Status: AC
  Administered 2011-09-29: 30 mg via INTRAVENOUS
  Filled 2011-09-29: qty 1

## 2011-09-29 MED ORDER — HYDROCODONE-ACETAMINOPHEN 5-325 MG PO TABS: 1.0000 | ORAL_TABLET | ORAL | Status: AC | PRN

## 2011-09-29 MED ORDER — KETOROLAC TROMETHAMINE 30 MG/ML IJ SOLN
30.0000 mg | Freq: Once | INTRAMUSCULAR | Status: AC
  Administered 2011-09-29: 30 mg via INTRAVENOUS

## 2011-09-29 MED ORDER — LACTATED RINGERS IV SOLN
INTRAVENOUS | Status: DC
  Administered 2011-09-29: 19:00:00 via INTRAVENOUS

## 2011-09-29 MED ORDER — MIDAZOLAM HCL 5 MG/5ML IJ SOLN
INTRAMUSCULAR | Status: DC | PRN
  Administered 2011-09-29: 2 mg via INTRAVENOUS

## 2011-09-29 MED ORDER — SODIUM CHLORIDE 0.9 % IJ SOLN: 3.0000 mL | INTRAMUSCULAR | Status: DC | PRN

## 2011-09-29 MED ORDER — EPHEDRINE SULFATE 50 MG/ML IJ SOLN
INTRAMUSCULAR | Status: DC | PRN
  Administered 2011-09-29 (×3): 10 mg via INTRAVENOUS

## 2011-09-29 MED ORDER — SODIUM CHLORIDE 0.9 % IJ SOLN
INTRAMUSCULAR | Status: DC | PRN
  Administered 2011-09-29: 12:00:00

## 2011-09-29 MED ORDER — MIDAZOLAM HCL 2 MG/2ML IJ SOLN: 1.0000 mg | INTRAMUSCULAR | Status: DC | PRN

## 2011-09-29 MED ORDER — ONDANSETRON HCL 4 MG/2ML IJ SOLN: 4.0000 mg | Freq: Once | INTRAMUSCULAR | Status: DC

## 2011-09-29 MED ORDER — LACTATED RINGERS IV SOLN
INTRAVENOUS | Status: DC | PRN
  Administered 2011-09-29 (×2): via INTRAVENOUS

## 2011-09-29 MED ORDER — MIDAZOLAM HCL 2 MG/2ML IJ SOLN
INTRAMUSCULAR | Status: AC
  Filled 2011-09-29: qty 2

## 2011-09-29 MED ORDER — BUPIVACAINE-EPINEPHRINE PF 0.5-1:200000 % IJ SOLN
INTRAMUSCULAR | Status: DC | PRN
  Administered 2011-09-29: 17.5 mL

## 2011-09-29 MED ORDER — HEPARIN SODIUM (PORCINE) 5000 UNIT/ML IJ SOLN
INTRAMUSCULAR | Status: AC
  Filled 2011-09-29: qty 1

## 2011-09-29 MED ORDER — FENTANYL CITRATE 0.05 MG/ML IJ SOLN
INTRAMUSCULAR | Status: AC
  Filled 2011-09-29: qty 2

## 2011-09-29 MED ORDER — OXYCODONE HCL 5 MG PO TABS
5.0000 mg | ORAL_TABLET | ORAL | Status: DC | PRN
  Administered 2011-09-30: 10 mg via ORAL
  Filled 2011-09-29: qty 2

## 2011-09-29 MED ORDER — MORPHINE SULFATE 2 MG/ML IJ SOLN
2.0000 mg | INTRAMUSCULAR | Status: DC | PRN
  Administered 2011-09-29 – 2011-09-30 (×3): 2 mg via INTRAVENOUS
  Filled 2011-09-29 (×3): qty 1

## 2011-09-29 MED ORDER — ACETAMINOPHEN 325 MG PO TABS: 650.0000 mg | ORAL_TABLET | ORAL | Status: DC | PRN

## 2011-09-29 MED ORDER — PHENYLEPHRINE HCL 10 MG/ML IJ SOLN
INTRAMUSCULAR | Status: DC | PRN
  Administered 2011-09-29 (×3): 80 ug via INTRAVENOUS

## 2011-09-29 MED ORDER — MORPHINE SULFATE 4 MG/ML IJ SOLN: 4.0000 mg | INTRAMUSCULAR | Status: DC | PRN

## 2011-09-29 MED ORDER — HEPARIN SOD (PORK) LOCK FLUSH 100 UNIT/ML IV SOLN
INTRAVENOUS | Status: DC | PRN
  Administered 2011-09-29: 300 [IU]

## 2011-09-29 MED ORDER — HYDROMORPHONE HCL PF 1 MG/ML IJ SOLN
0.2500 mg | INTRAMUSCULAR | Status: DC | PRN
  Administered 2011-09-29 (×4): 0.5 mg via INTRAVENOUS

## 2011-09-29 SURGICAL SUPPLY — 89 items
ADH SKN CLS APL DERMABOND .7 (GAUZE/BANDAGES/DRESSINGS) ×4
ADH SKN CLS LQ APL DERMABOND (GAUZE/BANDAGES/DRESSINGS)
APL SKNCLS STERI-STRIP NONHPOA (GAUZE/BANDAGES/DRESSINGS) ×2
APPLIER CLIP 9.375 MED OPEN (MISCELLANEOUS) ×3
APR CLP MED 9.3 20 MLT OPN (MISCELLANEOUS) ×2
BAG DECANTER FOR FLEXI CONT (MISCELLANEOUS) ×3 IMPLANT
BENZOIN TINCTURE PRP APPL 2/3 (GAUZE/BANDAGES/DRESSINGS) ×3 IMPLANT
BINDER BREAST LRG (GAUZE/BANDAGES/DRESSINGS) ×2 IMPLANT
BINDER BREAST XLRG (GAUZE/BANDAGES/DRESSINGS) IMPLANT
BLADE SURG 10 STRL SS (BLADE) ×3 IMPLANT
BLADE SURG 15 STRL LF DISP TIS (BLADE) ×2 IMPLANT
BLADE SURG 15 STRL SS (BLADE) ×3
BLADE SURG ROTATE 9660 (MISCELLANEOUS) IMPLANT
CANISTER SUCTION 2500CC (MISCELLANEOUS) IMPLANT
CHLORAPREP W/TINT 26ML (MISCELLANEOUS) ×3 IMPLANT
CLIP APPLIE 9.375 MED OPEN (MISCELLANEOUS) ×2 IMPLANT
CLOTH BEACON ORANGE TIMEOUT ST (SAFETY) ×3 IMPLANT
CONT SPEC 4OZ CLIKSEAL STRL BL (MISCELLANEOUS) ×3 IMPLANT
COVER PROBE W GEL 5X96 (DRAPES) ×1 IMPLANT
COVER SURGICAL LIGHT HANDLE (MISCELLANEOUS) ×3 IMPLANT
CRADLE DONUT ADULT HEAD (MISCELLANEOUS) ×3 IMPLANT
DECANTER SPIKE VIAL GLASS SM (MISCELLANEOUS) ×2 IMPLANT
DERMABOND ADHESIVE PROPEN (GAUZE/BANDAGES/DRESSINGS)
DERMABOND ADVANCED (GAUZE/BANDAGES/DRESSINGS) ×2
DERMABOND ADVANCED .7 DNX12 (GAUZE/BANDAGES/DRESSINGS) ×2 IMPLANT
DERMABOND ADVANCED .7 DNX6 (GAUZE/BANDAGES/DRESSINGS) ×2 IMPLANT
DEVICE DUBIN SPECIMEN MAMMOGRA (MISCELLANEOUS) ×1 IMPLANT
DRAIN CHANNEL 19F RND (DRAIN) IMPLANT
DRAPE C-ARM 42X72 X-RAY (DRAPES) ×3 IMPLANT
DRAPE LAPAROSCOPIC ABDOMINAL (DRAPES) ×3 IMPLANT
ELECT CAUTERY BLADE 6.4 (BLADE) ×3 IMPLANT
ELECT REM PT RETURN 9FT ADLT (ELECTROSURGICAL) ×3
ELECTRODE REM PT RTRN 9FT ADLT (ELECTROSURGICAL) ×2 IMPLANT
EVACUATOR SILICONE 100CC (DRAIN) IMPLANT
GAUZE SPONGE 4X4 16PLY XRAY LF (GAUZE/BANDAGES/DRESSINGS) ×3 IMPLANT
GEL ULTRASOUND 20GR AQUASONIC (MISCELLANEOUS) ×2 IMPLANT
GLOVE BIO SURGEON STRL SZ7.5 (GLOVE) ×2 IMPLANT
GLOVE BIOGEL PI IND STRL 7.0 (GLOVE) IMPLANT
GLOVE BIOGEL PI IND STRL 7.5 (GLOVE) IMPLANT
GLOVE BIOGEL PI INDICATOR 7.0 (GLOVE) ×2
GLOVE BIOGEL PI INDICATOR 7.5 (GLOVE) ×2
GLOVE ECLIPSE 6.5 STRL STRAW (GLOVE) ×2 IMPLANT
GLOVE EUDERMIC 7 POWDERFREE (GLOVE) ×3 IMPLANT
GOWN PREVENTION PLUS XLARGE (GOWN DISPOSABLE) ×3 IMPLANT
GOWN STRL NON-REIN LRG LVL3 (GOWN DISPOSABLE) ×6 IMPLANT
INTRODUCER 13FR (MISCELLANEOUS) IMPLANT
INTRODUCER COOK 11FR (CATHETERS) IMPLANT
KIT BASIN OR (CUSTOM PROCEDURE TRAY) ×3 IMPLANT
KIT MARKER MARGIN INK (KITS) ×1 IMPLANT
KIT PORT POWER 9.6FR MRI PREA (Catheter) IMPLANT
KIT PORT POWER ISP 8FR (Catheter) IMPLANT
KIT POWER CATH 8FR (Catheter) IMPLANT
KIT ROOM TURNOVER OR (KITS) ×3 IMPLANT
NDL 18GX1X1/2 (RX/OR ONLY) (NEEDLE) IMPLANT
NDL HYPO 25GX1X1/2 BEV (NEEDLE) ×2 IMPLANT
NEEDLE 18GX1X1/2 (RX/OR ONLY) (NEEDLE) ×3 IMPLANT
NEEDLE 22X1 1/2 (OR ONLY) (NEEDLE) ×2 IMPLANT
NEEDLE HYPO 25GX1X1/2 BEV (NEEDLE) ×6 IMPLANT
NS IRRIG 1000ML POUR BTL (IV SOLUTION) ×3 IMPLANT
PACK GENERAL/GYN (CUSTOM PROCEDURE TRAY) ×3 IMPLANT
PACK SURGICAL SETUP 50X90 (CUSTOM PROCEDURE TRAY) ×3 IMPLANT
PAD ARMBOARD 7.5X6 YLW CONV (MISCELLANEOUS) ×6 IMPLANT
PENCIL BUTTON HOLSTER BLD 10FT (ELECTRODE) ×3 IMPLANT
RUBBERBAND STERILE (MISCELLANEOUS) ×1 IMPLANT
SET INTRODUCER 12FR PACEMAKER (SHEATH) IMPLANT
SET SHEATH INTRODUCER 10FR (MISCELLANEOUS) IMPLANT
SHEATH COOK PEEL AWAY SET 9F (SHEATH) IMPLANT
SPONGE GAUZE 4X4 12PLY (GAUZE/BANDAGES/DRESSINGS) ×2 IMPLANT
SPONGE LAP 4X18 X RAY DECT (DISPOSABLE) ×3 IMPLANT
STAPLER VISISTAT 35W (STAPLE) ×3 IMPLANT
STRIP CLOSURE SKIN 1/2X4 (GAUZE/BANDAGES/DRESSINGS) ×2 IMPLANT
SURGILUBE 3G PEEL PACK STRL (MISCELLANEOUS) IMPLANT
SUT ETHILON 3 0 FSL (SUTURE) IMPLANT
SUT MNCRL AB 4-0 PS2 18 (SUTURE) ×3 IMPLANT
SUT PROLENE 2 0 CT2 30 (SUTURE) ×3 IMPLANT
SUT SILK 2 0 SH (SUTURE) ×3 IMPLANT
SUT VIC AB 3-0 SH 18 (SUTURE) ×3 IMPLANT
SUT VIC AB 3-0 SH 27 (SUTURE) ×3
SUT VIC AB 3-0 SH 27XBRD (SUTURE) ×2 IMPLANT
SUT VICRYL AB 3 0 TIES (SUTURE) IMPLANT
SYR 20ML ECCENTRIC (SYRINGE) ×6 IMPLANT
SYR 5ML LUER SLIP (SYRINGE) ×3 IMPLANT
SYR BULB 3OZ (MISCELLANEOUS) ×3 IMPLANT
SYR CONTROL 10ML LL (SYRINGE) ×5 IMPLANT
TOWEL OR 17X24 6PK STRL BLUE (TOWEL DISPOSABLE) ×3 IMPLANT
TOWEL OR 17X26 10 PK STRL BLUE (TOWEL DISPOSABLE) ×3 IMPLANT
TUBE CONNECTING 12X1/4 (SUCTIONS) ×1 IMPLANT
WATER STERILE IRR 1000ML POUR (IV SOLUTION) IMPLANT
YANKAUER SUCT BULB TIP NO VENT (SUCTIONS) ×1 IMPLANT

## 2011-09-29 NOTE — Op Note (Addendum)
Patient Name:           [name]   Date of Surgery:        09/29/2011  Pre op Diagnosis:      High-grade invasive ductal carcinoma left breast, lower inner quadrant, HORMONE RECEPTOR-NEGATIVE, Her-2 positive, clinical stage T1c., N0  Post op Diagnosis:    same  Procedure:                 Inject blue dye left breast, left partial mastectomy with needle localization, left axillary sentinel node biopsy, insertion of 8 French venous vascular access device using fluoroscopic guidance and ultrasonic guidance.  Surgeon:                     [name]. [name], M.D., FACS  Assistant:                      none  Operative Indications:   This is a 66 year old African American female was recently found to have an abnormal mammogram of the left breast. She was found to have high-grade invasive carcinoma of the left breast in the lower inner quadrant with lymphovascular invasion. By mammogram this was 7 mm in diameter and by MRI was 1.6 cm in diameter. This was a solitary mass. Hormone receptor negative. HER-2/neu-positive. Clinical stage T1c., N0. She has been evaluated by me and by Dr. Drue Second. She is strongly motivated for breast conservation surgery. Dr. Welton Flakes plans adjuvant chemotherapy. She is brought to the operating room for partial mastectomy, sentinel node biopsy, and Port-A-Cath insertion.  Operative Findings:       The breast tissue felt normal. The specimen mammogram showed the marker clip and the wire to be in the center of the specimen. I took extra posterior margin and sent that as a separate specimen. I found 2 sentinel lymph nodes. The port is inserted through the right internal jugular vein and is well positioned on CXR.  Procedure in Detail:          Following the induction of general endotracheal anesthesia the patient was positioned on the operating table. The localizing wire which had been placed earlier in the day was in the lower inner quadrant of the left breast and directed  from medial to lateral. I reviewed the mammograms with the wire in place and the wire was well-placed. Surgical time out was performed. Following alcohol prep I injected 5 cc of blue dye in the left breast, subareolar area. This was 2 cc of methylene blue mixed with 3 cc of saline. I massaged the breast for 5 minutes. The entire left chest wall and axilla were then prepped and draped in a sterile fashion. Intravenous antibiotics were given. 0.5% Marcaine with epinephrine was used as a local infiltration anesthetic. A radially oriented incision was made in the left breast at about the 8:30 position. Dissection was carried down into the breast tissue around the localizing wire. On the mammograms the wire had gone well past the marker clip and I felt the tip of the wire at the posterior margin of the specimen.  I took a separate posterior margin marked as well.  The specimen mammogram showed that the marker clip and the wire looked very well-positioned. The lumpectomy specimen and the additional posterior margin were sent as separate specimens. Hemostasis was excellent and achieved with  electrocautery. The wound was irrigated with saline. The breast tissues were closed in multiple layers with interrupted sutures of  3-0 Vicryl and the skin was closed with a running subcuticular suture of 4-0 Monocryl and Dermabond.  I made a transverse incision in the left axilla at the hairline. Dissection was carried down through subcutaneous tissue. I incised the clavipectoral fascia and into the axillary space. I dissected out 2 sentinel lymph nodes which had strong radioactivity and some blue dye. I found  no other radioactive or blue nodes and so I sent two  lymph nodes. Hemostasis was excellent. The wound was irrigated with saline. The deeper tissues were closed with interrupted sutures of 3-0 Vicryl and the skin closed with a running subcuticular suture of 4-0 Monocryl and Dermabond.  We then took all the drapes off and  repositioned the patient with her arms tucked at her sides and a roll behind her shoulder. We extended the neck.  We then prepped and draped the neck and the entire chest. Another surgical time out was performed  A right subclavian venipuncture was attempted but I could not access the vein. I did get into the subclavian artery and abandoned the subclavian approach.  I then went to the right neck. The ultrasound machine was brought to the operative field and I easily imaged the internal jugular vein and carotid artery. I inserted the venipuncture needle into the internal jugular vein, got good blood return and threaded the wire into the internal jugular vein. Fluoroscopy was performed and confirmed that the wire was in the superior vena cava just at the right atrial junction. Using fluoroscopy as a guide I a marked a template on the chest wall so the tip of the catheter would be in the superior vena cava just above the right atrium. I then used a marking pen and marked the site for the port pocket about 2 cm below the clavicle. Transverse incision was made.. A pocket was made at the level of the pectoralis fascia. Using a tunneling device I drew  the catheter from the right neck wire insertion site to the infraclavicular port pocket site. I then measured the catheter using the template I had marked on the chest wall.   It was cut 25 cm. The catheter was secured to the port with the locking device and they were both flushed with heparinized saline. I sutured the port to the pectoralis fascia with multiple sutures of 2-0 Prolene. With the patient in Trendelenburg position I inserted the dilator and peel-away sheath assembly over the guidewire. This went easily. The wire and the dilator were  removed and the catheter was threaded through the peel-away sheath the peel-away sheath removed. The catheter flushed easily and had excellent blood return. Fluoroscopy confirmed that the tip of the catheter was in the superior  vena cava right at the right atrial junction and there was no deformity of the catheter anywhere along its course. I flushed the port and catheter with concentrated heparin. There was no bleeding. Subcutaneous tissue was closed with 3-0 Vicryl sutures and the skin closed with subcuticular sutures of 4-0 Monocryl and Dermabond. Bandages were placed.  The patient was taken recovery room where a chest x-ray will be obtained. There were no complications. EBL 30 cc. Complications none. Counts correct.     [name]. [name], M.D., FACS General and Minimally Invasive Surgery Breast and Colorectal Surgery  09/29/2011 1:55 PM

## 2011-09-29 NOTE — Transfer of Care (Signed)
Immediate Anesthesia Transfer of Care Note  Patient: [name]  Procedure(s) Performed: Procedure(s) (LRB): PARTIAL MASTECTOMY WITH NEEDLE LOCALIZATION AND AXILLARY SENTINEL LYMPH NODE BX (Left) INSERTION PORT-A-CATH (N/A)  Patient Location: PACU  Anesthesia Type: General  Level of Consciousness: awake, alert  and oriented  Airway & Oxygen Therapy: Patient Spontanous Breathing and Patient connected to nasal cannula oxygen  Post-op Assessment: Report given to PACU RN, Post -op Vital signs reviewed and stable and Patient moving all extremities X 4  Post vital signs: Reviewed and stable  Complications: No apparent anesthesia complications

## 2011-09-29 NOTE — OR Nursing (Signed)
1st procedure end at 1248. 2nd procedure start at 1259.

## 2011-09-29 NOTE — Progress Notes (Signed)
Admitted to room 5118. Moved self to bed from stretcher without difficulty. Oriented to room and surroundings. Denies nausea, c/o pain 9/10. Family at bedside

## 2011-09-29 NOTE — Discharge Instructions (Signed)
[facility] °Office Phone Number [phone] ° °BREAST BIOPSY/ PARTIAL MASTECTOMY: POST OP INSTRUCTIONS ° °Always review your discharge instruction sheet given to you by the facility where your surgery was performed. ° °IF YOU HAVE DISABILITY OR FAMILY LEAVE FORMS, YOU MUST BRING THEM TO THE OFFICE FOR PROCESSING.  DO NOT GIVE THEM TO YOUR DOCTOR. ° °1. A prescription for pain medication may be given to you upon discharge.  Take your pain medication as prescribed, if needed.  If narcotic pain medicine is not needed, then you may take acetaminophen (Tylenol) or ibuprofen (Advil) as needed. °2. Take your usually prescribed medications unless otherwise directed °3. If you need a refill on your pain medication, please contact your pharmacy.  They will contact our office to request authorization.  Prescriptions will not be filled after 5pm or on week-ends. °4. You should eat very light the first 24 hours after surgery, such as soup, crackers, pudding, etc.  Resume your normal diet the day after surgery. °5. Most patients will experience some swelling and bruising in the breast.  Ice packs and a good support bra will help.  Swelling and bruising can take several days to resolve.  °6. It is common to experience some constipation if taking pain medication after surgery.  Increasing fluid intake and taking a stool softener will usually help or prevent this problem from occurring.  A mild laxative (Milk of Magnesia or Miralax) should be taken according to package directions if there are no bowel movements after 48 hours. °7. Unless discharge instructions indicate otherwise, you may remove your bandages 24-48 hours after surgery, and you may shower at that time.  You may have steri-strips (small skin tapes) in place directly over the incision.  These strips should be left on the skin for 7-10 days.  If your surgeon used skin glue on the incision, you may shower in 24 hours.  The glue will flake off over the  next 2-3 weeks.  Any sutures or staples will be removed at the office during your follow-up visit. °8. ACTIVITIES:  You may resume regular daily activities (gradually increasing) beginning the next day.  Wearing a good support bra or sports bra minimizes pain and swelling.  You may have sexual intercourse when it is comfortable. °a. You may drive when you no longer are taking prescription pain medication, you can comfortably wear a seatbelt, and you can safely maneuver your car and apply brakes. °b. RETURN TO WORK:  ______________________________________________________________________________________ °9. You should see your doctor in the office for a follow-up appointment approximately two weeks after your surgery.  Your doctor’s nurse will typically make your follow-up appointment when she calls you with your pathology report.  Expect your pathology report 2-3 business days after your surgery.  You may call to check if you do not hear from us after three days. °10. OTHER INSTRUCTIONS: _______________________________________________________________________________________________ _____________________________________________________________________________________________________________________________________ °_____________________________________________________________________________________________________________________________________ °_____________________________________________________________________________________________________________________________________ ° °WHEN TO CALL YOUR DOCTOR: °1. Fever over 101.0 °2. Nausea and/or vomiting. °3. Extreme swelling or bruising. °4. Continued bleeding from incision. °5. Increased pain, redness, or drainage from the incision. ° °The clinic staff is available to answer your questions during regular business hours.  Please don’t hesitate to call and ask to speak to one of the nurses for clinical concerns.  If you have a medical emergency, go to the nearest  emergency room or call 911.  A surgeon from [facility] is always on call at the hospital. ° °For further questions, please visit [email or web address]  °

## 2011-09-29 NOTE — Anesthesia Preprocedure Evaluation (Addendum)
Anesthesia Evaluation  Patient identified by MRN, date of birth, ID band Patient awake    Reviewed: Allergy & Precautions, H&P , NPO status , Patient's Chart, lab work & pertinent test results  History of Anesthesia Complications Negative for: history of anesthetic complications  Airway Mallampati: II TM Distance: >3 FB Neck ROM: Full    Dental No notable dental hx. (+) Teeth Intact and Dental Advisory Given   Pulmonary former smoker breath sounds clear to auscultation  Pulmonary exam normal       Cardiovascular hypertension, Rhythm:Regular Rate:Normal  '12 stress test: no ischemia, EF 61%   Neuro/Psych negative neurological ROS     GI/Hepatic Neg liver ROS, hiatal hernia, GERD-  Medicated and Poorly Controlled,  Endo/Other  Diabetes mellitus- (patient denies diabetes)Hypothyroidism (on replacement) Morbid obesity  Renal/GU negative Renal ROS     Musculoskeletal   Abdominal (+) + obese,   Peds  Hematology   Anesthesia Other Findings   Reproductive/Obstetrics Breast cancer: for mastectomy and port-a-cath today                          Anesthesia Physical Anesthesia Plan  ASA: II  Anesthesia Plan: General   Post-op Pain Management:    Induction: Intravenous  Airway Management Planned: Oral ETT  Additional Equipment:   Intra-op Plan:   Post-operative Plan: Extubation in OR  Informed Consent: I have reviewed the patients History and Physical, chart, labs and discussed the procedure including the risks, benefits and alternatives for the proposed anesthesia with the patient or authorized representative who has indicated his/her understanding and acceptance.   Dental advisory given  Plan Discussed with: Surgeon and CRNA  Anesthesia Plan Comments: (Plan routine monitors, GETA)        Anesthesia Quick Evaluation

## 2011-09-29 NOTE — Interval H&P Note (Signed)
History and Physical Interval Note:  09/29/2011 10:46 AM  [name]  has presented today for surgery, with the diagnosis of left breast cancer  The goals of treatment and the various methods of treatment have been discussed with the patient and family. After consideration of risks, benefits and other options for treatment, the patient has consented to  Procedure(s) (LRB): PARTIAL MASTECTOMY WITH NEEDLE LOCALIZATION AND AXILLARY SENTINEL LYMPH NODE BX (Left) INSERTION PORT-A-CATH (N/A) as a surgical intervention .  The patients' history has been reviewed and the  patient examined today, no change in status, stable for surgery.  I have reviewed the patients' chart and labs.  Questions were answered to the patient's satisfaction.     Ernestene Mention

## 2011-09-29 NOTE — Anesthesia Postprocedure Evaluation (Signed)
  Anesthesia Post-op Note  Patient: [name]  Procedure(s) Performed: Procedure(s) (LRB): PARTIAL MASTECTOMY WITH NEEDLE LOCALIZATION AND AXILLARY SENTINEL LYMPH NODE BX (Left) INSERTION PORT-A-CATH (N/A)  Patient Location: PACU  Anesthesia Type: General  Level of Consciousness: awake, alert  and oriented  Airway and Oxygen Therapy: Patient Spontanous Breathing  Post-op Pain: mild  Post-op Assessment: Post-op Vital signs reviewed, Patient's Cardiovascular Status Stable, Respiratory Function Stable, Patent Airway, No signs of Nausea or vomiting and Pain level controlled  Post-op Vital Signs: Reviewed and stable  Complications: No apparent anesthesia complications

## 2011-09-30 ENCOUNTER — Encounter (HOSPITAL_COMMUNITY): Payer: Self-pay | Admitting: General Surgery

## 2011-09-30 ENCOUNTER — Other Ambulatory Visit: Payer: Medicare Other | Admitting: Lab

## 2011-09-30 ENCOUNTER — Ambulatory Visit: Payer: Medicare Other | Admitting: Family

## 2011-09-30 ENCOUNTER — Ambulatory Visit: Payer: Medicare Other

## 2011-09-30 NOTE — Progress Notes (Signed)
1 Day Post-Op  Subjective: The patient admitted overnight for pain control. Most of her pain is in the right neck and right infraclavicular area where the Port-A-Cath was placed. Chest x-ray shows the port to be well positioned without any obvious complication.  She is doing better this morning and feels like she can go home. No nausea or vomiting. No respiratory problems. There has been no bleeding or complaints overnight. She has been sleeping during the night.  Objective: Vital signs in last 24 hours: Temp:  [94.4 F (34.7 C)-98.4 F (36.9 C)] 98.4 F (36.9 C) (05/01 0516) Pulse Rate:  [68-84] 72  (05/01 0516) Resp:  [6-23] 16  (05/01 0516) BP: (98-143)/(58-96) 114/69 mmHg (05/01 0516) SpO2:  [94 %-100 %] 100 % (05/01 0516) Weight:  [196 lb (88.905 kg)] 196 lb (88.905 kg) (04/30 1836) Last BM Date: 09/29/11  Intake/Output from previous day: 04/30 0701 - 05/01 0700 In: 2997.5 [P.O.:610; I.V.:2387.5] Out: 775 [Urine:750; Blood:25] Intake/Output this shift: Total I/O In: 1247.5 [P.O.:360; I.V.:887.5] Out: 750 [Urine:750]  General appearance: alert. Mental status normal. In no distress. Skin warm and dry. Neck:  no JVD, supple, symmetrical, trachea midline,  she is tender to palpate the right neck, but the right neck shows no swelling or hematoma. Tissues are soft. Chest wall: , port in right infraclavicular area is palpable. Incision looks fine. No hematoma. No edema. Mild diffuse tenderness. No focal abnormalities. Breasts: normal appearance, no masses or tenderness, left breast and left axillary incisions look fine. No swelling no hematoma no drainage. Skin healthy.  Lab Results:  No results found for this or any previous visit (from the past 24 hour(s)).   Studies/Results: @RISRSLT24 @     .  ceFAZolin (ANCEF) IV  2 g Intravenous 60 min Pre-Op  . heparin      . heparin  5,000 Units Subcutaneous Once  . ketorolac  30 mg Intravenous Once  . ketorolac  30 mg Intravenous  Q6H  . oxyCODONE-acetaminophen      . sodium chloride  3 mL Intravenous Q12H  . DISCONTD: ondansetron (ZOFRAN) IV  4 mg Intravenous Once     Assessment/Plan: s/p Procedure(s): PARTIAL MASTECTOMY WITH NEEDLE LOCALIZATION AND AXILLARY SENTINEL LYMPH NODE BX INSERTION PORT-A-CATH  POD #1. Stable Pain now under control. No apparent surgical complications Discharge home today. Followup with me in office in 2-3 weeks. Followup with medical oncology to initiate chemotherapy later this month.  I told her that her pathology report should be out tomorrow and we can call that to her and discussed that.  Patient Active Hospital Problem List: No active hospital problems.   LOS: 1 day    Emmory Solivan M. [name], M.D., [facility] General and Minimally invasive Surgery Breast and Colorectal Surgery Office:   [phone] Pager:   [phone]  09/30/2011  . .prob

## 2011-10-01 ENCOUNTER — Telehealth (INDEPENDENT_AMBULATORY_CARE_PROVIDER_SITE_OTHER): Payer: Self-pay | Admitting: General Surgery

## 2011-10-01 NOTE — Telephone Encounter (Signed)
Pt advised of po appt and that path is still pending.

## 2011-10-02 ENCOUNTER — Telehealth (INDEPENDENT_AMBULATORY_CARE_PROVIDER_SITE_OTHER): Payer: Self-pay

## 2011-10-02 NOTE — Telephone Encounter (Signed)
Pt advised path still pending.

## 2011-10-02 NOTE — Telephone Encounter (Signed)
The patient called about having a small amount of bleeding from her breast incision.  There is no redness or fever.  She has glue over the incision.  She has not been showering.  I instructed her to shower daily, keep the area clean and dry.  She should apply a pad or gauze over the incision and watch that for signs of infection.    She inquired about her path.  I told her I would inform Dr Jacinto Halim nurse to call her.

## 2011-10-05 ENCOUNTER — Telehealth (INDEPENDENT_AMBULATORY_CARE_PROVIDER_SITE_OTHER): Payer: Self-pay

## 2011-10-05 NOTE — Progress Notes (Signed)
Quick Note:  Inform patient of Pathology report,. ______ 

## 2011-10-05 NOTE — Telephone Encounter (Signed)
Pt notified of path result. 

## 2011-10-06 ENCOUNTER — Ambulatory Visit: Payer: Medicare Other

## 2011-10-07 ENCOUNTER — Ambulatory Visit: Payer: Medicare Other

## 2011-10-09 ENCOUNTER — Ambulatory Visit (HOSPITAL_BASED_OUTPATIENT_CLINIC_OR_DEPARTMENT_OTHER): Payer: Medicare Other | Admitting: Oncology

## 2011-10-09 ENCOUNTER — Other Ambulatory Visit (HOSPITAL_BASED_OUTPATIENT_CLINIC_OR_DEPARTMENT_OTHER): Payer: Medicare Other | Admitting: Lab

## 2011-10-09 ENCOUNTER — Encounter: Payer: Self-pay | Admitting: Oncology

## 2011-10-09 VITALS — BP 129/85 | HR 80 | Temp 97.7°F | Ht 65.5 in | Wt 217.8 lb

## 2011-10-09 DIAGNOSIS — Z170 Estrogen receptor positive status [ER+]: Secondary | ICD-10-CM

## 2011-10-09 DIAGNOSIS — C50319 Malignant neoplasm of lower-inner quadrant of unspecified female breast: Secondary | ICD-10-CM

## 2011-10-09 LAB — COMPREHENSIVE METABOLIC PANEL
AST: 32 U/L (ref 0–37)
Alkaline Phosphatase: 87 U/L (ref 39–117)
BUN: 24 mg/dL — ABNORMAL HIGH (ref 6–23)
Calcium: 9.3 mg/dL (ref 8.4–10.5)
Chloride: 105 mEq/L (ref 96–112)
Creatinine, Ser: 0.9 mg/dL (ref 0.50–1.10)

## 2011-10-09 LAB — CBC WITH DIFFERENTIAL/PLATELET
BASO%: 0.3 % (ref 0.0–2.0)
EOS%: 1.5 % (ref 0.0–7.0)
HCT: 30.4 % — ABNORMAL LOW (ref 34.8–46.6)
LYMPH%: 34.9 % (ref 14.0–49.7)
MCH: 29.8 pg (ref 25.1–34.0)
MCHC: 33.3 g/dL (ref 31.5–36.0)
MCV: 89.6 fL (ref 79.5–101.0)
MONO%: 8.7 % (ref 0.0–14.0)
NEUT%: 54.6 % (ref 38.4–76.8)
Platelets: 232 10*3/uL (ref 145–400)

## 2011-10-09 NOTE — Progress Notes (Signed)
OFFICE PROGRESS NOTE  CC  [name], MD, MD [address] [facility] [address] [place] [address] Dr. Claud Kelp  DIAGNOSIS: 46 year ol female with new diagnosis of stage I ER-/PR-/Her2Neu positive invasive ductal breast cancer  PRIOR THERAPY: 1. 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 ER negative PR negative Her2 Neu positive with Ki -67 53%,   2. S/P porta cath palcement for chemotherapy  CURRENT THERAPY:To begin adjuvant chemotherapy [facility])  INTERVAL HISTORY: [name] 66 y.[name]. female returns for follow up after her surgery.She overall is doing well. She tolerated the surgery well. She is also having some pain at the incision site. No other complaints.  MEDICAL HISTORY: Past Medical History  Diagnosis Date  . Obesity   . PUD (peptic ulcer disease)   . Leukopenia   . Abdominal pain   . Short bowel syndrome   . Internal hemorrhoids without mention of complication   . Stricture and stenosis of esophagus   . Osteoarthrosis, unspecified whether generalized or localized, unspecified site   . Thyrotoxicosis without mention of goiter or other cause, without mention of thyrotoxic crisis or storm   . Other and unspecified hyperlipidemia   . Gout, unspecified   . Diverticulosis of colon (without mention of hemorrhage)   . C. difficile colitis   . VITAMIN B12 DEFICIENCY 08/30/2009  . GOITER, MULTINODULAR 04/02/2009  . HYPOTHYROIDISM, POST-RADIATION 08/13/2009  . ASYMPTOMATIC POSTMENOPAUSAL STATUS 10/11/2008  . Esophageal reflux 06/12/2008  . PONV (postoperative nausea and vomiting)   . Varicose veins   . Type II or unspecified type diabetes mellitus without mention of complication, not stated as uncontrolled     no med in years diet controled  . Blood transfusion   . UTI (urinary tract infection)   . Kidney stones     "several"  . Breast cancer 09/29/11    left  . Unspecified essential  hypertension   . Angina   . Shortness of breath on exertion     "sometimes"  . Anemia   . ANEMIA, IRON DEFICIENCY 05/08/2009  . History of lower GI bleeding   . H/[name] hiatal hernia   . Migraines     ALLERGIES:  is allergic to aspirin; iodine; and morphine and related.  MEDICATIONS:  Current Outpatient Prescriptions  Medication Sig Dispense Refill  . Calcium Carbonate-Vitamin D (CALCIUM-VITAMIN D) 600-200 MG-UNIT CAPS Take 1 capsule by mouth daily.        . cyanocobalamin (,VITAMIN B-12,) 1000 MCG/ML injection Inject 1,000 mcg into the muscle every 30 (thirty) days. Next one due the 26th of this month      . cyclobenzaprine (FLEXERIL) 5 MG tablet Take 5 mg by mouth 3 (three) times daily as needed.      Marland Kitchen HYDROcodone-acetaminophen (NORCO) 5-325 MG per tablet Take 1-2 tablets by mouth every 4 (four) hours as needed for pain.  50 tablet  1  . levothyroxine (SYNTHROID, LEVOTHROID) 125 MCG tablet Take 125 mcg by mouth daily.      Marland Kitchen lovastatin (MEVACOR) 20 MG tablet Take 20 mg by mouth at bedtime.      . magnesium chloride (SLOW-MAG) 64 MG TBEC Take 2 tablets by mouth daily.        . nitroGLYCERIN (NITROSTAT) 0.4 MG SL tablet Place 0.4 mg under the tongue every 5 (five) minutes as needed. For chest pain      . omeprazole (PRILOSEC) 40  MG capsule Take 40 mg by mouth daily.      Marland Kitchen oxyCODONE-acetaminophen (PERCOCET) 5-325 MG per tablet Take 1 tablet by mouth every 6 (six) hours as needed. For pain      . promethazine (PHENERGAN) 25 MG tablet Take 25 mg by mouth every 8 (eight) hours as needed. For nausea      . thiamine 100 MG tablet Take 100 mg by mouth daily.        . trazodone (DESYREL) 300 MG tablet Take 300 mg by mouth at bedtime.      . cholestyramine (QUESTRAN) 4 G packet Take 1 packet by mouth daily.        SURGICAL HISTORY:  Past Surgical History  Procedure Date  . Vein ligation and stripping 1980's    Right leg  . Lithotripsy     "4 or 5 times"  . Bunionectomy 1970's    bilateral   . Colonoscopy   . Esophagogastroduodenoscopy 07/15/2005  . Thyroid ultrasound 12/1994 and 12/1995  . Mastectomy w/ nodes partial 09/29/11    left  . Port a cath placement 09/29/11    right chest  . Breast surgery   . Cholecystectomy 1990's  . Abdominal hysterectomy 1970's    with BSO  . Dilation and curettage of uterus   . Colon surgery     "several surgeries for short bowel syndrome"  . Abdominal adhesion surgery 1980's thru 1990's    "several"  . Kidney stone surgery 1990's    "tried to go up & get it but pushed it further up"  . Portacath placement 09/29/2011    Procedure: INSERTION PORT-A-CATH;  Surgeon: Ernestene Mention, MD;  Location[facility] OR;  Service: General;  Laterality: N/A;    REVIEW OF SYSTEMS:  Pertinent items are noted in HPI.   PHYSICAL EXAMINATION: General appearance: alert, cooperative and appears stated age Resp: clear to auscultation bilaterally and normal percussion bilaterally Cardio: regular rate and rhythm, S1, S2 normal, no murmur, click, rub or gallop GI: soft, non-tender; bowel sounds normal; no masses,  no organomegaly Extremities: extremities normal, atraumatic, no cyanosis or edema Neurologic: Grossly normal Bilateral Breast Exam: left breast healing incision scar, with some tenderness, no evidence of infections.Right breast no masses or nipple discharge ECOG PERFORMANCE STATUS: 1 - Symptomatic but completely ambulatory  Blood pressure 129/85, pulse 80, temperature 97.7 F (36.5 C), height 5' 5.5" (1.664 m), weight 217 lb 12.8 oz (98.793 kg).  LABORATORY DATA: Lab Results  Component Value Date   WBC 5.0 10/09/2011   HGB 10.1* 10/09/2011   HCT 30.4* 10/09/2011   MCV 89.6 10/09/2011   PLT 232 10/09/2011      Chemistry      Component Value Date/Time   NA 138 09/23/2011 1045   K 3.9 09/23/2011 1045   CL 104 09/23/2011 1045   CO2 25 09/23/2011 1045   BUN 13 09/23/2011 1045   CREATININE 0.89 09/23/2011 1045   CREATININE 0.88 03/31/2011 1550        Component Value Date/Time   CALCIUM 9.8 09/23/2011 1045   CALCIUM 9.5 05/15/2010 2153   ALKPHOS 94 09/23/2011 1045   AST 29 09/23/2011 1045   ALT 25 09/23/2011 1045   BILITOT 0.5 09/23/2011 1045     ADDITIONAL INFORMATION: 1. PROGNOSTIC INDICATORS - ACIS Results IMMUNOHISTOCHEMICAL AND MORPHOMETRIC ANALYSIS BY THE AUTOMATED CELLULAR IMAGING SYSTEM (ACIS) Estrogen Receptor (Negative, <1%): 0%, NEGATIVE Progesterone Receptor (Negative, <1%): 0%, NEGATIVE COMMENT: The negative hormone receptor study(ies) in this case  have an internal positive control. All controls stained appropriately Abigail Miyamoto MD Pathologist, Electronic Signature ( Signed 10/07/2011) FINAL DIAGNOSIS 1 of 4 FINAL for [name], [name] ([id number]) Diagnosis 1. Breast, lumpectomy, Left - INVASIVE GRADE III, DUCTAL CARCINOMA, SPANNING 0.7 CM. - ASSOCIATED HIGH GRADED DUCTAL CARCINOMA IN SITU. - LYMPH/VASCULAR INVASION NOT IDENTIFIED. - MARGINS ARE NEGATIVE. - SEE ONCOLOGY TEMPLATE. 2. Lymph node, sentinel, biopsy, Left axillary#1 - ONE BENIGN LYMPH NODE WITH NO TUMOR (0/1). - BENIGN GLANDULAR EPITHELIAL INCLUSIONS PRESENT. - SEE COMMENT. 3. Breast, excision, Posterior margin - BENIGN BREAST PARENCHYMA. - NO ATYPIA, HYPERPLASIA, OR MALIGNANCY IDENTIFIED. 4. Lymph node, sentinel, biopsy, Left axillary #2 - ONE BENIGN LYMPH NODE WITH NO TUMOR SEEN (0/1). Microscopic Comment 1. BREAST, INVASIVE TUMOR, WITH LYMPH NODE SAMPLING Specimen, including laterality: Left breast with posterior margin and sentinel lymph nodes. Procedure: Left breast lumpectomy with posterior margin excision and sentinel lymph node biopsies. Grade: III. Tubule formation: 3. Nuclear pleomorphism: 3. Mitotic:3. Tumor size (gross measurement and glass slide measurement): 0.7 cm. Margins: Invasive, distance to closest margin: At least 0.8 cm. In-situ, distance to closest margin: At least 0.8 cm. Lymphovascular invasion: Not  identified. Ductal carcinoma in situ: Yes. Grade: High grade. Extensive intraductal component: No. Lobular neoplasia: No. Tumor focality: Unifocal. Treatment effect: N/A. Extent of tumor: Confined to breast parenchyma. Lymph nodes: # examined: 2. Lymph nodes with metastasis: 0. Breast prognostic profile: Performed on previous case ([id number]) Estrogen receptor: 0%, negative. Progesterone receptor: 0%, negative. Her 2 neu: 3.09, amplified. Ki-67: 53%. Non-neoplastic breast: Fat necrosis present. TNM: pT1b, pN0, MX. Comments: An estrogen receptor and progesterone receptor will be repeated on the current tumor and reported in an addendum. (RAH:gt, 10/01/11) 2. The left sentinel axillary lymph node #1 shows benign glandular epithelial inclusions. Some of the inclusions are ciliated. The nuclei are bland appearing and are not malignant. Smooth muscle myosin, p63 and calponin immunohistochemical stains are performed which do not show a myoepithelial layer. 2 of 4 FINAL for [name], [name] ([id number]) Microscopic Comment(continued) Although this is the case, the inclusions are benign and do not represent metastatic carcinoma. Both Dr. Frederica Kuster and Dr. Colonel Bald have seen the left sentinel axillary lymph node in consultation with agreement that the inclusions are benign and do not represent metastatic carcinoma. Zandra Abts MD Pathologist, Electronic Signature (Case signed 10/02/2011) Specimen Gross and Clinical Information Specimen(s) Obtained: 1. Breast, lumpectomy, Left 2. Lymph node, sentinel, biopsy, Left axillary#1 3. Breast, excision, Posterior margin 4. Lymph node, sentinel, biopsy, Left axillary #2 Specimen Clinical  RADIOGRAPHIC STUDIES:  Chest 2 View  09/23/2011  *RADIOLOGY REPORT*  Clinical Data: 66 year old female preoperative study for left breast surgery.  History of anemia, diabetes.  CHEST - 2 VIEW  Comparison: 04/17/2011 and earlier.  Findings: Stable lung  volumes.  Stable mild cardiomegaly. Other mediastinal contours are within normal limits.  Visualized tracheal air column is within normal limits.  No pneumothorax, pulmonary edema, pleural effusion or confluent pulmonary opacity.  Right upper quadrant surgical clips. No acute osseous abnormality identified.  IMPRESSION: No acute cardiopulmonary abnormality.  Original Report Authenticated By: Harley Hallmark, M.D.   Nm Sentinel Node Inj-no Rpt (breast)  09/29/2011  CLINICAL DATA: left breast cancer   Sulfur colloid was injected intradermally by the nuclear medicine  technologist for breast cancer sentinel node localization.     Dg Chest Port 1 View  09/29/2011  *RADIOLOGY REPORT*  Clinical Data: Postop Port-A-Cath insertion.  PORTABLE CHEST - 1 VIEW  Comparison: 09/23/2011  Findings: Right Port-A-Cath has been placed.  The tip is in the SVC.  No pneumothorax.  Minimal left base atelectasis or scarring. Right lung is clear.  No effusions.  Heart is borderline in size.  IMPRESSION: Right Port-A-Cath tip in the SVC.  No pneumothorax.  Original Report Authenticated By: Cyndie Chime, M.D.   Mm Breast Surgical Specimen  09/29/2011  *RADIOLOGY REPORT*  Clinical Data:  Left breast cancer  LEFT BREAST NEEDLE LOCALIZATION WITH MAMMOGRAPHIC GUIDANCE AND SPECIMEN RADIOGRAPH  Patient presents for needle localization prior to surgical excision. The patient had a discussed the procedure of needle localization including benefits and alternatives. We discussed the high likelihood of a successful procedure. We discussed the risks of the procedure, including infection, bleeding, tissue injury and further surgery. Informed written consent was given.  Using mammographic guidance, sterile technique, 2% lidocaine and a 7 cm modified Kopans needle, the clip marking the cancer in the medial aspect of the left breast was localized using a mediolateral approach. Films were labeled and sent with the patient surgery. She tolerated  procedure well.  Specimen radiograph was performed at [facility] operating room and confirms the mass, clip and wire to be present in the tissue sample.  The specimen is marked for pathology.  IMPRESSION: Needle localization left breast.  No apparent complications.  Original Report Authenticated By: Daryl Eastern, M.D.   Mm Breast Wire Localization Left  09/29/2011  *RADIOLOGY REPORT*  Clinical Data:  Left breast cancer  LEFT BREAST NEEDLE LOCALIZATION WITH MAMMOGRAPHIC GUIDANCE AND SPECIMEN RADIOGRAPH  Patient presents for needle localization prior to surgical excision. The patient had a discussed the procedure of needle localization including benefits and alternatives. We discussed the high likelihood of a successful procedure. We discussed the risks of the procedure, including infection, bleeding, tissue injury and further surgery. Informed written consent was given.  Using mammographic guidance, sterile technique, 2% lidocaine and a 7 cm modified Kopans needle, the clip marking the cancer in the medial aspect of the left breast was localized using a mediolateral approach. Films were labeled and sent with the patient surgery. She tolerated procedure well.  Specimen radiograph was performed at [facility] operating room and confirms the mass, clip and wire to be present in the tissue sample.  The specimen is marked for pathology.  IMPRESSION: Needle localization left breast.  No apparent complications.  Original Report Authenticated By: Daryl Eastern, M.D.   Dg Fluoro Guide Cv Line-no Report  09/29/2011  CLINICAL DATA: port a cath placement   FLOURO GUIDE CV LINE  Fluoroscopy was utilized by the requesting physician.  No radiographic  interpretation.      ASSESSMENT: 65 year ol with   1. 0.7 cm high grade invasive ductal carcinoma that is ER-, PR- Her2Neu +, sentinel node negative (T1bN0) pathologic stage I.  2. Patient will need adjuvant Her2Neu based chemotherapy  To high  risk of recurrence.  PLAN:  1. Recommend TCH (taxotere and cytoxan and Herceptin). She will receive TC q 3 weeks with day 2 neulasta and herceptin weekly. A total of 4 cycles of TCH is planned. Herceptin will be given for one year.  2. Risks and benefits of chemotherapy are clearly discussed. She also has has her port placed, chemo class and echocardiogram.  3. Because she is getting Herceptin I will planned on setting her up to see Dr. Delene Ruffini as well for on cardiac monitoring.   All questions were answered. The patient knows to  call the clinic with any problems, questions or concerns. We can certainly see the patient much sooner if necessary.  I spent 25 minutes counseling the patient face to face. The total time spent in the appointment was 30 minutes.    Drue Second, MD Medical/Oncology [facility] [phone] (beeper) [phone] (Office)  10/09/2011, 3:12 PM

## 2011-10-12 ENCOUNTER — Other Ambulatory Visit: Payer: Self-pay | Admitting: Medical Oncology

## 2011-10-12 ENCOUNTER — Telehealth: Payer: Self-pay | Admitting: *Deleted

## 2011-10-12 ENCOUNTER — Ambulatory Visit (INDEPENDENT_AMBULATORY_CARE_PROVIDER_SITE_OTHER): Payer: Medicare Other | Admitting: General Surgery

## 2011-10-12 ENCOUNTER — Encounter (INDEPENDENT_AMBULATORY_CARE_PROVIDER_SITE_OTHER): Payer: Self-pay | Admitting: General Surgery

## 2011-10-12 VITALS — BP 118/84 | HR 72 | Temp 97.0°F | Resp 16 | Ht 65.0 in | Wt 219.6 lb

## 2011-10-12 DIAGNOSIS — C50319 Malignant neoplasm of lower-inner quadrant of unspecified female breast: Secondary | ICD-10-CM

## 2011-10-12 MED ORDER — ONDANSETRON HCL 8 MG PO TABS: 8.0000 mg | ORAL_TABLET | Freq: Three times a day (TID) | ORAL | Status: DC | PRN

## 2011-10-12 MED ORDER — DEXAMETHASONE 4 MG PO TABS: 4.0000 mg | ORAL_TABLET | Freq: Two times a day (BID) | ORAL | Status: AC

## 2011-10-12 MED ORDER — PROCHLORPERAZINE MALEATE 10 MG PO TABS: 10.0000 mg | ORAL_TABLET | Freq: Four times a day (QID) | ORAL | Status: DC | PRN

## 2011-10-12 MED ORDER — LIDOCAINE-PRILOCAINE 2.5-2.5 % EX CREA: TOPICAL_CREAM | CUTANEOUS | Status: AC | PRN

## 2011-10-12 MED ORDER — ONDANSETRON HCL 8 MG PO TABS: 8.0000 mg | ORAL_TABLET | Freq: Three times a day (TID) | ORAL | Status: AC | PRN

## 2011-10-12 MED ORDER — DEXAMETHASONE 4 MG PO TABS: 4.0000 mg | ORAL_TABLET | Freq: Two times a day (BID) | ORAL | Status: DC

## 2011-10-12 MED ORDER — LORAZEPAM 0.5 MG PO TABS: 0.5000 mg | ORAL_TABLET | Freq: Four times a day (QID) | ORAL | Status: DC | PRN

## 2011-10-12 MED ORDER — PROCHLORPERAZINE 25 MG RE SUPP: 25.0000 mg | Freq: Two times a day (BID) | RECTAL | Status: DC | PRN

## 2011-10-12 MED ORDER — LORAZEPAM 0.5 MG PO TABS: 0.5000 mg | ORAL_TABLET | Freq: Four times a day (QID) | ORAL | Status: AC | PRN

## 2011-10-12 NOTE — Progress Notes (Signed)
Subjective:     Patient ID: [name], female   DOB: August 25, 1945, 66 y.o.   MRN: [id number]  HPI This patient underwent left partial mastectomy, sentinel nodebiopsy, and Port-A-Cath insertion on September 29, 2011.  Final pathology showed invasive duct carcinoma left breast, 7 mm diameter, lower inner quadrant, receptor-negative, HER-2 positive, Ki-67 53%, pathologic stage TI B., N0, negative margins.  Testing for BRCA1 and 2 and BART are negative.  She is in the planning phase for adjuvant chemotherapy. She does not know when she will receive radiation therapy. I told her that that would eventually need to be done.  She is doing fairly well from the surgery. Normal amount of discomfort. Moves her shoulder well. Exercising at home. Review of Systems     Objective:   Physical Exam Patient looks well. No distress. Her husband is with her.  Neck small puncture wound right neck healing uneventfully. No hematoma  Chest Port-A-Cath in the right infraclavicular area is healing. The port is palpable at the medial aspect of the transverse incision.  Breasts:    left breast is large. Medial incision healing normally, minimal edema, no hematoma or infection. Left axillary incision is healing well. No hematoma or infection. Range of motion left shoulder is about 95% of normal. No edema of the left arm. no sensory deficit left arm.    Assessment:     Invasive ductal carcinoma left breast, lower inner quadrant, receptor-negative, HER-2 positive, Ki-67 53%, pathologic stage T1b. N0  Recovering uneventfully in the early postop period following left partial mastectomy sentinel low biopsy and Port-A-Cath insertion  Genetic testing is negative  Strong family history for breast cancer    Plan:     Proceed with adjuvant chemotherapy after May 30.  At some point in time she will need to followup with Dr. Dorothy Puffer. I will defer the timing of that referral to Dr. Garey Ham.  Return to see  me in 8 weeks.  Left shoulder exercises encouraged.   [name]. [name], M.D., [facility] General and Minimally invasive Surgery Breast and Colorectal Surgery Office:   [phone] Pager:   [phone]

## 2011-10-12 NOTE — Telephone Encounter (Signed)
sent michelle email getting patient's treatment set up starting at 10-21-2011

## 2011-10-12 NOTE — Patient Instructions (Signed)
All of the surgical scars are healing normally. They will soften up over the next few months. There is no evidence of infection or bleeding.  Please do your left shoulder stretching exercises several times a day.  You may start chemotherapy anytime after May 30.  You will eventually need radiation therapy, and you should discuss this with Dr. Dorothy Puffer.  Return to see Dr. [name] in 2 months.

## 2011-10-13 ENCOUNTER — Telehealth: Payer: Self-pay | Admitting: *Deleted

## 2011-10-13 ENCOUNTER — Ambulatory Visit: Payer: Medicare Other

## 2011-10-13 NOTE — Telephone Encounter (Signed)
Per e-mail from Laurie, I have scheduled treatment appts for the patient. Appts in computer and Laurie aware.  JMW  

## 2011-10-14 ENCOUNTER — Encounter: Payer: Self-pay | Admitting: *Deleted

## 2011-10-14 NOTE — Progress Notes (Signed)
Clinical Social Worker met with pt and pt's husband in [place] office to review financial assistance resources and applications.  CSW and pt reviewed and collected required documents for applications.  CSW also contacted financial counselor to obtain additional information requested by the applications.  Once information is received CSW will complete application and submit.  Applications include [employer], [employer] [place], and [employer].  CSW and pt plan to follow up on 10-21-11 during her appointment.  Tamala Julian, MSW, LCSW Clinical Social Worker [facility] [phone]

## 2011-10-15 ENCOUNTER — Other Ambulatory Visit: Payer: Self-pay

## 2011-10-15 MED ORDER — OXYCODONE-ACETAMINOPHEN 5-325 MG PO TABS: 1.0000 | ORAL_TABLET | Freq: Four times a day (QID) | ORAL | Status: DC | PRN

## 2011-10-16 NOTE — Telephone Encounter (Signed)
Pt informed, Rx in cabinet for pt pick up  

## 2011-10-20 ENCOUNTER — Ambulatory Visit: Payer: Medicare Other

## 2011-10-21 ENCOUNTER — Other Ambulatory Visit (HOSPITAL_BASED_OUTPATIENT_CLINIC_OR_DEPARTMENT_OTHER): Payer: Medicare Other | Admitting: Lab

## 2011-10-21 ENCOUNTER — Ambulatory Visit (HOSPITAL_BASED_OUTPATIENT_CLINIC_OR_DEPARTMENT_OTHER): Payer: Medicare Other | Admitting: Family

## 2011-10-21 ENCOUNTER — Encounter: Payer: Self-pay | Admitting: Family

## 2011-10-21 ENCOUNTER — Ambulatory Visit: Payer: Medicare Other

## 2011-10-21 VITALS — BP 124/84 | HR 83 | Temp 98.7°F | Ht 65.0 in | Wt 219.1 lb

## 2011-10-21 DIAGNOSIS — C50919 Malignant neoplasm of unspecified site of unspecified female breast: Secondary | ICD-10-CM

## 2011-10-21 DIAGNOSIS — Z171 Estrogen receptor negative status [ER-]: Secondary | ICD-10-CM

## 2011-10-21 DIAGNOSIS — C50319 Malignant neoplasm of lower-inner quadrant of unspecified female breast: Secondary | ICD-10-CM

## 2011-10-21 DIAGNOSIS — L539 Erythematous condition, unspecified: Secondary | ICD-10-CM

## 2011-10-21 LAB — CBC WITH DIFFERENTIAL/PLATELET
Basophils Absolute: 0 10*3/uL (ref 0.0–0.1)
Eosinophils Absolute: 0 10*3/uL (ref 0.0–0.5)
HCT: 31.8 % — ABNORMAL LOW (ref 34.8–46.6)
HGB: 10.6 g/dL — ABNORMAL LOW (ref 11.6–15.9)
MCV: 86.9 fL (ref 79.5–101.0)
MONO%: 5.4 % (ref 0.0–14.0)
NEUT#: 7.3 10*3/uL — ABNORMAL HIGH (ref 1.5–6.5)
Platelets: 251 10*3/uL (ref 145–400)
RDW: 14.5 % (ref 11.2–14.5)

## 2011-10-21 MED ORDER — CEPHALEXIN 500 MG PO CAPS: 500.0000 mg | ORAL_CAPSULE | Freq: Two times a day (BID) | ORAL | Status: DC

## 2011-10-21 NOTE — Progress Notes (Signed)
OFFICE PROGRESS NOTE  CC: [name], MD Dr. 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 ER negative PR negative Her2 Neu positive with Ki -67 53%,  T1b N0.   PRIOR THERAPY: 1. Partial mastectomy, left breast with SNL.   2. Porta cath placement for chemotherapy 09/29/11.  3. Genetic testing: BRCA and BART negative.   CURRENT THERAPY:To begin adjuvant chemotherapy [facility])  INTERVAL HISTORY: Here to begin chemotherapy. Saw Dr. [name] 5/13, he recommended beginning chemo after May 30 to allow for complete healing of breast incision.   She has felt well, continues to have intermittent pain in the left breast and left axilla. Wears a bra to hold the breast stable, which helps with the pain. No headache or blurred vision. No cough or shortness of breath. No abdominal pain or new bone pain. Bowel and bladder function are normal. Appetite is good, with adequate fluid intake. Remainder of the 10 point  review of systems is negative.  ALLERGIES:  Aspirin, iodine, morphine and related.  REVIEW OF SYSTEMS:  Pertinent items are noted in HPI.   PHYSICAL EXAMINATION:  General: Well developed, well nourished, in no acute distress. Accompanied by her husband who is engaged and supportive.  EENT: No ocular or oral lesions. No stomatitis.  Respiratory: Lungs are clear to auscultation bilaterally with normal respiratory movement and no accessory muscle use. Cardiac: No murmur, rub or tachycardia. No upper or lower extremity edema.  GI: Abdomen is soft, no palpable hepatosplenomegaly. No fluid wave. No tenderness. Musculoskeletal: No kyphosis, no tenderness over the spine, ribs or hips. Lymph: No cervical, infraclavicular, axillary or inguinal adenopathy. Neuro: No focal neurological deficits. Psych: Alert and oriented X 3, appropriate mood and affect.  BREAST EXAM: In the supine position, with  the right arm over the head, the right nipple is everted. No periareolar edema or nipple discharge. No mass in any quadrant or subareolar region. No redness of the skin. No right axillary adenopathy. With the left arm over the head, the left nipple is everted. No periareolar edema or nipple discharge. In the 10 o'clock position extending downward toward the nipple, a 6 cm incision with dimpling of the skin. No exudate. No mass in any quadrant or subareolar region. Erythema surrounding the nipple, extending laterally into the axillary tail. No left axillary adenopathy.    Blood pressure 124/84, pulse 83, temperature 98.7 F (37.1 C), temperature source Oral, height 5\' 5"  (1.651 m), weight 219 lb 1.6 oz (99.383 kg).  LABORATORY DATA: Lab Results  Component Value Date   WBC 9.1 10/21/2011   HGB 10.6* 10/21/2011   HCT 31.8* 10/21/2011   MCV 86.9 10/21/2011   PLT 251 10/21/2011   ASSESSMENT: 66 year old with:  1. Newly diagnosed left breast cancer, completed surgery. 2. Dr. [name] cleared to begin chemo after 10/29/11.  3. Erythema, left breast, post-op.  PLAN:  1. Will defer today's treatment until next week to allow for complete healing, left breast. Will begin TCH (taxotere and cytoxan and Herceptin) next week. She will receive TC q 3 weeks with day 2 neulasta and herceptin weekly. A total of 4 cycles of     TCH is planned. Herceptin will be given for one year. 2. Prescription for Keflex, 500 mg po bid for 7 days for left breast erythema.  3. Return to see me in 1 week prior to planned treatment to assess for resolution of  left breast erythema.    All questions were answered. The patient knows to call the clinic with any problems, questions or concerns.   The plan was discussed with Dr. Welton Flakes and she agrees.

## 2011-10-22 ENCOUNTER — Ambulatory Visit: Payer: Medicare Other

## 2011-10-27 ENCOUNTER — Other Ambulatory Visit: Payer: Self-pay | Admitting: Family

## 2011-10-27 ENCOUNTER — Ambulatory Visit (INDEPENDENT_AMBULATORY_CARE_PROVIDER_SITE_OTHER): Payer: Medicare Other | Admitting: *Deleted

## 2011-10-27 ENCOUNTER — Ambulatory Visit: Payer: Medicare Other

## 2011-10-27 DIAGNOSIS — E538 Deficiency of other specified B group vitamins: Secondary | ICD-10-CM

## 2011-10-27 MED ORDER — CYANOCOBALAMIN 1000 MCG/ML IJ SOLN
1000.0000 ug | Freq: Once | INTRAMUSCULAR | Status: AC
  Administered 2011-10-27: 1000 ug via INTRAMUSCULAR

## 2011-10-28 ENCOUNTER — Other Ambulatory Visit (HOSPITAL_BASED_OUTPATIENT_CLINIC_OR_DEPARTMENT_OTHER): Payer: Medicare Other | Admitting: Lab

## 2011-10-28 ENCOUNTER — Encounter: Payer: Self-pay | Admitting: Family

## 2011-10-28 ENCOUNTER — Ambulatory Visit: Payer: Medicare Other

## 2011-10-28 ENCOUNTER — Ambulatory Visit (HOSPITAL_BASED_OUTPATIENT_CLINIC_OR_DEPARTMENT_OTHER): Payer: Medicare Other | Admitting: Family

## 2011-10-28 ENCOUNTER — Other Ambulatory Visit: Payer: Self-pay | Admitting: Oncology

## 2011-10-28 VITALS — BP 121/84 | HR 80 | Temp 98.3°F | Ht 65.0 in | Wt 217.6 lb

## 2011-10-28 DIAGNOSIS — C50319 Malignant neoplasm of lower-inner quadrant of unspecified female breast: Secondary | ICD-10-CM

## 2011-10-28 DIAGNOSIS — C50919 Malignant neoplasm of unspecified site of unspecified female breast: Secondary | ICD-10-CM

## 2011-10-28 LAB — CBC WITH DIFFERENTIAL/PLATELET
Basophils Absolute: 0 10*3/uL (ref 0.0–0.1)
EOS%: 0.1 % (ref 0.0–7.0)
Eosinophils Absolute: 0 10*3/uL (ref 0.0–0.5)
HGB: 10.3 g/dL — ABNORMAL LOW (ref 11.6–15.9)
LYMPH%: 26.7 % (ref 14.0–49.7)
MCH: 29.6 pg (ref 25.1–34.0)
MCV: 89.5 fL (ref 79.5–101.0)
MONO%: 8 % (ref 0.0–14.0)
NEUT#: 3.4 10*3/uL (ref 1.5–6.5)
Platelets: 212 10*3/uL (ref 145–400)

## 2011-10-28 LAB — COMPREHENSIVE METABOLIC PANEL
BUN: 12 mg/dL (ref 6–23)
CO2: 23 mEq/L (ref 19–32)
Creatinine, Ser: 0.93 mg/dL (ref 0.50–1.10)
Glucose, Bld: 112 mg/dL — ABNORMAL HIGH (ref 70–99)
Total Bilirubin: 0.6 mg/dL (ref 0.3–1.2)

## 2011-10-28 MED ORDER — ONDANSETRON HCL 8 MG PO TABS: ORAL_TABLET | ORAL | Status: DC

## 2011-10-28 MED ORDER — PROCHLORPERAZINE 25 MG RE SUPP: 25.0000 mg | Freq: Two times a day (BID) | RECTAL | Status: DC | PRN

## 2011-10-28 MED ORDER — DEXAMETHASONE 4 MG PO TABS: ORAL_TABLET | ORAL | Status: DC

## 2011-10-28 MED ORDER — PROCHLORPERAZINE MALEATE 10 MG PO TABS: 10.0000 mg | ORAL_TABLET | Freq: Four times a day (QID) | ORAL | Status: DC | PRN

## 2011-10-28 MED ORDER — LORAZEPAM 0.5 MG PO TABS: 0.5000 mg | ORAL_TABLET | Freq: Four times a day (QID) | ORAL | Status: DC | PRN

## 2011-10-28 NOTE — Progress Notes (Signed)
OFFICE PROGRESS NOTE  CC: Rachel Belling, MD Dr. 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 ER negative PR negative Her2 Neu positive with Ki -67 53%,  T1b N0.   PRIOR THERAPY: 1. Partial mastectomy, left breast with SNL.   2. Porta cath placement for chemotherapy 09/29/11.  3. Genetic testing: BRCA and BART negative.   CURRENT THERAPY:To begin adjuvant chemotherapy [facility])  INTERVAL HISTORY: Here to begin chemotherapy. Saw Dr. [name] 5/13, he recommended beginning chemo after May 30 to allow for complete healing of breast incision. Was seen in the clinic last week, presented with warm, erythematous left breast. Keflex 500 mg po bid was prescribed and she completed that prescription last night.   Has felt well, continues to have intermittent pain in the left breast and left axilla. Wears a bra to hold the breast stable, which helps with the pain. No fever, no discharge or exudate surrounding the incisiom. No headache or blurred vision. No cough or shortness of breath. No abdominal pain or new bone pain. Bowel and bladder function are normal. Appetite is good, with adequate fluid intake. Remainder of the 10 point  review of systems is negative.  ALLERGIES:  Aspirin, iodine, morphine and related.  REVIEW OF SYSTEMS:  Pertinent items are noted in HPI.   PHYSICAL EXAMINATION:  General: Well developed, well nourished, in no acute distress. Accompanied by her husband who is engaged and supportive.  EENT: No ocular or oral lesions. No stomatitis.  Respiratory: Lungs are clear to auscultation bilaterally with normal respiratory movement and no accessory muscle use. Cardiac: No murmur, rub or tachycardia. No upper or lower extremity edema.  GI: Abdomen is soft, no palpable hepatosplenomegaly. No fluid wave. No tenderness. Musculoskeletal: No kyphosis, no tenderness over the spine, ribs or  hips. Lymph: No cervical, infraclavicular, axillary or inguinal adenopathy. Neuro: No focal neurological deficits. Psych: Alert and oriented X 3, appropriate mood and affect.  BREAST EXAM: In the supine position, with the right arm over the head, the right nipple is everted. No periareolar edema or nipple discharge. No mass in any quadrant or subareolar region. No redness of the skin. No right axillary adenopathy. With the left arm over the head, the left nipple is everted. No periareolar edema or nipple discharge. In the 10 o'clock position extending downward toward the nipple, a 6 cm incision with dimpling of the skin. No exudate. No mass in any quadrant or subareolar region. Erythema surrounding the nipple, extending laterally into the axillary tail. No left axillary adenopathy.    Blood pressure 121/84, pulse 80, temperature 98.3 F (36.8 C), temperature source Oral, height 5\' 5"  (1.651 m), weight 217 lb 9.6 oz (98.703 kg).  LABORATORY DATA: Lab Results  Component Value Date   WBC 5.2 10/28/2011   HGB 10.3* 10/28/2011   HCT 31.3* 10/28/2011   MCV 89.5 10/28/2011   PLT 212 10/28/2011   ASSESSMENT: 66 year old with:  1. Newly diagnosed left breast cancer, completed surgery. 2. Dr. [name] cleared to begin chemo after 10/29/11.  3. Erythema, left breast, post-op, stable after 7 days of Keflex.  PLAN:  1. Treatment 5/31. Will begin TCH (taxotere and cytoxan and Herceptin). She will receive TC q 3 weeks with day 2 neulasta and herceptin weekly. A total of 4 cycles of TCH is planned. Herceptin will be given for one year. 2. Neulasta injection 6/1.  3. Return to see me  6/5 for lab check only.  4. I instruct her to take her temperature daily and call the office for temp > 100.5.    All questions were answered. The patient knows to call the clinic with any problems, questions or concerns.   The plan was discussed with Magrinat and he agrees. He examined the patient.

## 2011-10-29 ENCOUNTER — Ambulatory Visit: Payer: Medicare Other

## 2011-10-30 ENCOUNTER — Ambulatory Visit (HOSPITAL_BASED_OUTPATIENT_CLINIC_OR_DEPARTMENT_OTHER): Payer: Medicare Other

## 2011-10-30 ENCOUNTER — Encounter: Payer: Self-pay | Admitting: *Deleted

## 2011-10-30 ENCOUNTER — Ambulatory Visit: Payer: Medicare Other

## 2011-10-30 VITALS — BP 111/78 | HR 76 | Temp 97.0°F

## 2011-10-30 DIAGNOSIS — Z5111 Encounter for antineoplastic chemotherapy: Secondary | ICD-10-CM

## 2011-10-30 DIAGNOSIS — C50319 Malignant neoplasm of lower-inner quadrant of unspecified female breast: Secondary | ICD-10-CM

## 2011-10-30 MED ORDER — DEXAMETHASONE SODIUM PHOSPHATE 4 MG/ML IJ SOLN
20.0000 mg | Freq: Once | INTRAMUSCULAR | Status: AC
  Administered 2011-10-30: 20 mg via INTRAVENOUS

## 2011-10-30 MED ORDER — SODIUM CHLORIDE 0.9 % IV SOLN
Freq: Once | INTRAVENOUS | Status: AC
  Administered 2011-10-30: 10:00:00 via INTRAVENOUS

## 2011-10-30 MED ORDER — TRASTUZUMAB CHEMO INJECTION 440 MG
4.0000 mg/kg | Freq: Once | INTRAVENOUS | Status: AC
  Administered 2011-10-30: 378 mg via INTRAVENOUS
  Filled 2011-10-30: qty 18

## 2011-10-30 MED ORDER — SODIUM CHLORIDE 0.9 % IV SOLN
721.2000 mg | Freq: Once | INTRAVENOUS | Status: AC
  Administered 2011-10-30: 720 mg via INTRAVENOUS
  Filled 2011-10-30: qty 72

## 2011-10-30 MED ORDER — DIPHENHYDRAMINE HCL 25 MG PO CAPS
50.0000 mg | ORAL_CAPSULE | Freq: Once | ORAL | Status: AC
  Administered 2011-10-30: 50 mg via ORAL

## 2011-10-30 MED ORDER — ACETAMINOPHEN 325 MG PO TABS
650.0000 mg | ORAL_TABLET | Freq: Once | ORAL | Status: AC
  Administered 2011-10-30: 650 mg via ORAL

## 2011-10-30 MED ORDER — ONDANSETRON 16 MG/50ML IVPB (CHCC)
16.0000 mg | Freq: Once | INTRAVENOUS | Status: AC
  Administered 2011-10-30: 16 mg via INTRAVENOUS

## 2011-10-30 MED ORDER — DOCETAXEL CHEMO INJECTION 160 MG/16ML
75.0000 mg/m2 | Freq: Once | INTRAVENOUS | Status: AC
  Administered 2011-10-30: 160 mg via INTRAVENOUS
  Filled 2011-10-30: qty 16

## 2011-10-30 NOTE — Progress Notes (Signed)
Introduced self to patient and husband after chemo as she has been enrolled in [place].  Patient stated treatment went well as she is feeling fine for now. She understands to call with questions.  RN to follow up.

## 2011-10-30 NOTE — Patient Instructions (Signed)
[facility] Discharge Instructions for Patients Receiving Chemotherapy  Today you received the following chemotherapy agents Taxotere, Carboplatin, Herceptin To help prevent nausea and vomiting after your treatment, we encourage you to take your nausea medication prochlorperazine, ondansteron, lorazepam. Begin taking it at 2100 and take it as often as prescribed for the next 72 hours.   If you develop nausea and vomiting that is not controlled by your nausea medication, call the clinic. If it is after clinic hours your family physician or the after hours number for the clinic or go to the Emergency Department.   BELOW ARE SYMPTOMS THAT SHOULD BE REPORTED IMMEDIATELY:  *FEVER GREATER THAN 100.5 F  *CHILLS WITH OR WITHOUT FEVER  NAUSEA AND VOMITING THAT IS NOT CONTROLLED WITH YOUR NAUSEA MEDICATION  *UNUSUAL SHORTNESS OF BREATH  *UNUSUAL BRUISING OR BLEEDING  TENDERNESS IN MOUTH AND THROAT WITH OR WITHOUT PRESENCE OF ULCERS  *URINARY PROBLEMS  *BOWEL PROBLEMS  UNUSUAL RASH Items with * indicate a potential emergency and should be followed up as soon as possible.  One of the nurses will contact you 24 hours after your treatment. Please let the nurse know about any problems that you may have experienced. Feel free to call the clinic you have any questions or concerns. The clinic phone number is [phone].   I have been informed and understand all the instructions given to me. I know to contact the clinic, my physician, or go to the Emergency Department if any problems should occur. I do not have any questions at this time, but understand that I may call the clinic during office hours   should I have any questions or need assistance in obtaining follow up care.    __________________________________________  _____________  __________ Signature of Patient or Authorized Representative            Date                    Time    __________________________________________ Nurse's Signature

## 2011-10-31 ENCOUNTER — Ambulatory Visit (HOSPITAL_BASED_OUTPATIENT_CLINIC_OR_DEPARTMENT_OTHER): Payer: Medicare Other

## 2011-10-31 VITALS — BP 112/79 | HR 73 | Temp 98.7°F

## 2011-10-31 DIAGNOSIS — C50319 Malignant neoplasm of lower-inner quadrant of unspecified female breast: Secondary | ICD-10-CM

## 2011-10-31 MED ORDER — PEGFILGRASTIM INJECTION 6 MG/0.6ML
6.0000 mg | Freq: Once | SUBCUTANEOUS | Status: AC
  Administered 2011-10-31: 6 mg via SUBCUTANEOUS
  Filled 2011-10-31: qty 0.6

## 2011-11-02 ENCOUNTER — Telehealth: Payer: Self-pay | Admitting: *Deleted

## 2011-11-02 ENCOUNTER — Ambulatory Visit: Payer: Medicare Other

## 2011-11-02 NOTE — Telephone Encounter (Signed)
Spoke with pt. to address concerns regarding treatment.  Says staff covered questions well.  Offered nutrition class and support group services.  Patient stated she wanted to wait for now.  Upcoming appointments reviewed.  She is to call with questions. Understanding verbalized.

## 2011-11-02 NOTE — Telephone Encounter (Signed)
Patient is doing well except feels tired.  Is not sleeping well.  Reports acid reflux and nausea with a little vomiting.  Has taken anti-emetics and prilosec bid is not working.  Will notify providers.  Reports she is eating and drinking well and no problems with bowels or bladder.  Denies questions at this time.

## 2011-11-02 NOTE — Telephone Encounter (Signed)
Message copied by Augusto Garbe on Mon Nov 02, 2011  4:20 PM ------      Message from: Tylene Fantasia      Created: Fri Oct 30, 2011  1:37 PM      Regarding: first time chemo      Contact: [phone]       First time Taxotere, Carbo, Herceptin.  Dr. Welton Flakes patient

## 2011-11-03 ENCOUNTER — Ambulatory Visit: Payer: Medicare Other

## 2011-11-04 ENCOUNTER — Ambulatory Visit (HOSPITAL_BASED_OUTPATIENT_CLINIC_OR_DEPARTMENT_OTHER): Payer: Medicare Other | Admitting: Family

## 2011-11-04 ENCOUNTER — Encounter: Payer: Self-pay | Admitting: Family

## 2011-11-04 ENCOUNTER — Ambulatory Visit: Payer: Medicare Other

## 2011-11-04 ENCOUNTER — Encounter: Payer: Self-pay | Admitting: *Deleted

## 2011-11-04 ENCOUNTER — Other Ambulatory Visit (HOSPITAL_BASED_OUTPATIENT_CLINIC_OR_DEPARTMENT_OTHER): Payer: Medicare Other | Admitting: Lab

## 2011-11-04 ENCOUNTER — Ambulatory Visit (HOSPITAL_BASED_OUTPATIENT_CLINIC_OR_DEPARTMENT_OTHER): Payer: Medicare Other

## 2011-11-04 VITALS — BP 118/83 | HR 90 | Temp 97.7°F | Ht 65.0 in | Wt 214.6 lb

## 2011-11-04 DIAGNOSIS — K5900 Constipation, unspecified: Secondary | ICD-10-CM

## 2011-11-04 DIAGNOSIS — E860 Dehydration: Secondary | ICD-10-CM

## 2011-11-04 DIAGNOSIS — C50319 Malignant neoplasm of lower-inner quadrant of unspecified female breast: Secondary | ICD-10-CM

## 2011-11-04 DIAGNOSIS — R5381 Other malaise: Secondary | ICD-10-CM

## 2011-11-04 DIAGNOSIS — R110 Nausea: Secondary | ICD-10-CM

## 2011-11-04 DIAGNOSIS — C50919 Malignant neoplasm of unspecified site of unspecified female breast: Secondary | ICD-10-CM

## 2011-11-04 DIAGNOSIS — Z5112 Encounter for antineoplastic immunotherapy: Secondary | ICD-10-CM

## 2011-11-04 DIAGNOSIS — M2550 Pain in unspecified joint: Secondary | ICD-10-CM

## 2011-11-04 LAB — CBC WITH DIFFERENTIAL/PLATELET
BASO%: 2.1 % — ABNORMAL HIGH (ref 0.0–2.0)
EOS%: 0.3 % (ref 0.0–7.0)
HCT: 33.4 % — ABNORMAL LOW (ref 34.8–46.6)
LYMPH%: 39.7 % (ref 14.0–49.7)
MCH: 29.5 pg (ref 25.1–34.0)
MCHC: 34.1 g/dL (ref 31.5–36.0)
MCV: 86.5 fL (ref 79.5–101.0)
MONO%: 2.9 % (ref 0.0–14.0)
NEUT%: 55 % (ref 38.4–76.8)
Platelets: 125 10*3/uL — ABNORMAL LOW (ref 145–400)

## 2011-11-04 LAB — COMPREHENSIVE METABOLIC PANEL
AST: 20 U/L (ref 0–37)
Alkaline Phosphatase: 83 U/L (ref 39–117)
BUN: 24 mg/dL — ABNORMAL HIGH (ref 6–23)
Calcium: 9.4 mg/dL (ref 8.4–10.5)
Chloride: 100 mEq/L (ref 96–112)
Creatinine, Ser: 0.92 mg/dL (ref 0.50–1.10)

## 2011-11-04 MED ORDER — SODIUM CHLORIDE 0.9 % IV SOLN
Freq: Once | INTRAVENOUS | Status: AC
  Administered 2011-11-04: 10:00:00 via INTRAVENOUS

## 2011-11-04 MED ORDER — DIPHENHYDRAMINE HCL 25 MG PO CAPS
50.0000 mg | ORAL_CAPSULE | Freq: Once | ORAL | Status: AC
  Administered 2011-11-04: 50 mg via ORAL

## 2011-11-04 MED ORDER — SODIUM CHLORIDE 0.9 % IJ SOLN
10.0000 mL | INTRAMUSCULAR | Status: DC | PRN
  Administered 2011-11-04: 10 mL
  Filled 2011-11-04: qty 10

## 2011-11-04 MED ORDER — SODIUM CHLORIDE 0.9 % IV SOLN
Freq: Once | INTRAVENOUS | Status: AC
  Administered 2011-11-04: 12:00:00 via INTRAVENOUS

## 2011-11-04 MED ORDER — HEPARIN SOD (PORK) LOCK FLUSH 100 UNIT/ML IV SOLN
500.0000 [IU] | Freq: Once | INTRAVENOUS | Status: AC | PRN
  Administered 2011-11-04: 500 [IU]
  Filled 2011-11-04: qty 5

## 2011-11-04 MED ORDER — ONDANSETRON 8 MG/50ML IVPB (CHCC)
8.0000 mg | Freq: Once | INTRAVENOUS | Status: AC
  Administered 2011-11-04: 8 mg via INTRAVENOUS

## 2011-11-04 MED ORDER — TRASTUZUMAB CHEMO INJECTION 440 MG
2.0000 mg/kg | Freq: Once | INTRAVENOUS | Status: AC
  Administered 2011-11-04: 189 mg via INTRAVENOUS
  Filled 2011-11-04: qty 9

## 2011-11-04 MED ORDER — ACETAMINOPHEN 325 MG PO TABS
650.0000 mg | ORAL_TABLET | Freq: Once | ORAL | Status: AC
  Administered 2011-11-04: 650 mg via ORAL

## 2011-11-04 MED ORDER — ONDANSETRON 8 MG/50ML IVPB (CHCC): 8.0000 mg | Freq: Once | INTRAVENOUS | Status: DC

## 2011-11-04 MED ORDER — SODIUM CHLORIDE 0.9 % IV SOLN: INTRAVENOUS | Status: DC

## 2011-11-04 NOTE — Patient Instructions (Signed)
[facility] Discharge Instructions for Patients Receiving Chemotherapy  Today you received the following chemotherapy agents Herceptin.  To help prevent nausea and vomiting after your treatment, we encourage you to take your nausea medication as prescribed.   If you develop nausea and vomiting that is not controlled by your nausea medication, call the clinic. If it is after clinic hours your family physician or the after hours number for the clinic or go to the Emergency Department.   BELOW ARE SYMPTOMS THAT SHOULD BE REPORTED IMMEDIATELY:  *FEVER GREATER THAN 101.0 F  *CHILLS WITH OR WITHOUT FEVER  NAUSEA AND VOMITING THAT IS NOT CONTROLLED WITH YOUR NAUSEA MEDICATION  *UNUSUAL SHORTNESS OF BREATH  *UNUSUAL BRUISING OR BLEEDING  TENDERNESS IN MOUTH AND THROAT WITH OR WITHOUT PRESENCE OF ULCERS  *URINARY PROBLEMS  *BOWEL PROBLEMS  UNUSUAL RASH Items with * indicate a potential emergency and should be followed up as soon as possible.  One of the nurses will contact you 24 hours after your treatment. Please let the nurse know about any problems that you may have experienced. Feel free to call the clinic you have any questions or concerns. The clinic phone number is [phone].   I have been informed and understand all the instructions given to me. I know to contact the clinic, my physician, or go to the Emergency Department if any problems should occur. I do not have any questions at this time, but understand that I may call the clinic during office hours or the Patient Navigator at [phone] should I have any questions or need assistance in obtaining follow up care.    __________________________________________  _____________  __________ Signature of Patient or Authorized Representative            Date                   Time    __________________________________________ Nurse's Signature

## 2011-11-04 NOTE — Progress Notes (Signed)
  OFFICE PROGRESS NOTE  CC: Rachel Belling, MD Dr. 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 ER negative PR negative Her2 Neu positive with Ki -67 53%,  T1b N0.   PRIOR THERAPY: 1. Partial mastectomy, left breast with SNL.   2. Porta cath placement for chemotherapy 09/29/11.  3. Genetic testing: BRCA and BART negative.   CURRENT THERAPY:Adjuvant chemotherapy [facility])  INTERVAL HISTORY: Completed cycle one of TC 10/31/11. Does not feel well today, experiencing fatigue, arthralgias and taste changes. Has poor appetite, not taking adequate fluids. Received Neulasta with first cycle of chemo, no significant bone pain. Had reflux following chemo with dyspepsia. Uses Prilosec with moderate results.   No headache or blurred vision. No cough or shortness of breath. No abdominal pain or new bone pain. Bowel and bladder function are normal. Appetite is good, with adequate fluid intake. Remainder of the 10 point  review of systems is negative.  ALLERGIES:  Aspirin, iodine, morphine and related.  PHYSICAL EXAMINATION:  General: Well developed, well nourished, in no acute distress. Accompanied by her husband who is engaged and supportive.  EENT: No ocular or oral lesions. No stomatitis.  Respiratory: Lungs are clear to auscultation bilaterally with normal respiratory movement and no accessory muscle use. Cardiac: No murmur, rub or tachycardia. No upper or lower extremity edema.  GI: Abdomen is soft, no palpable hepatosplenomegaly. No fluid wave. No tenderness. Musculoskeletal: No kyphosis, no tenderness over the spine, ribs or hips. Lymph: No cervical, infraclavicular, axillary or inguinal adenopathy. Neuro: No focal neurological deficits. Psych: Alert and oriented X 3, appropriate mood and affect.    Blood pressure 118/83, pulse 90, temperature 97.7 F (36.5 C), temperature source Oral, height  5\' 5"  (1.651 m), weight 214 lb 9.6 oz (97.342 kg).  LABORATORY DATA: Lab Results  Component Value Date   WBC 3.8* 11/04/2011   HGB 11.4* 11/04/2011   HCT 33.4* 11/04/2011   MCV 86.5 11/04/2011   PLT 125* 11/04/2011   ASSESSMENT: 66 year old with:  1. Left breast cancer, completed one cycle of TCH.  2. Poor po intake.  3. Constipation.   PLAN:  1. Herceptin only today.  2. Add fluids and iv Zofran to treatment.  3. MOM nightly.  4. Return 6/12 to see Dr. Welton Flakes and Herceptin only. Standing orders for labs are entered.     All questions were answered. The patient knows to call the clinic with any problems, questions or concerns.

## 2011-11-05 ENCOUNTER — Other Ambulatory Visit: Payer: Self-pay | Admitting: Medical Oncology

## 2011-11-05 ENCOUNTER — Telehealth: Payer: Self-pay | Admitting: Medical Oncology

## 2011-11-05 NOTE — Telephone Encounter (Signed)
Patient called "I am having a lot of back pain and my pain medicine is not touching it.  Can Dr. Welton Flakes give me something else?"  Patient denies shortness of breath, chest pain.  States pain is in the upper middle of her back.  Bowels have been moving, no abdominal cramping or pain.  Patient states she took 2 pain pills around 1030 and has gotten no relief from them.  Advised patient that I would speak with Dr. Welton Flakes and return a phone call to her.  Patient expressed understanding.  Returned call to patient, per MD she is to go to emergency room to be evaluated.  Patient states "Do I really need to go?  There is no one who can take me right now and it will be later this afternoon when my husband gets back.  I will just wait and see what happens."  Advised patient that it was her decision as to what she was going to do but these were the recommendations of Dr. Welton Flakes.  Patient expressed understanding.  Instructed patients that if symptoms become worse, any shortness of breath that she is to call 911.  Patient again expressed understanding.  No further questions at this time.

## 2011-11-09 ENCOUNTER — Other Ambulatory Visit: Payer: Self-pay | Admitting: *Deleted

## 2011-11-09 DIAGNOSIS — C50319 Malignant neoplasm of lower-inner quadrant of unspecified female breast: Secondary | ICD-10-CM

## 2011-11-09 MED ORDER — LORAZEPAM 0.5 MG PO TABS: 0.5000 mg | ORAL_TABLET | Freq: Four times a day (QID) | ORAL | Status: DC | PRN

## 2011-11-10 ENCOUNTER — Ambulatory Visit: Payer: Medicare Other

## 2011-11-11 ENCOUNTER — Ambulatory Visit: Payer: Medicare Other

## 2011-11-11 ENCOUNTER — Ambulatory Visit (HOSPITAL_BASED_OUTPATIENT_CLINIC_OR_DEPARTMENT_OTHER): Payer: Medicare Other

## 2011-11-11 ENCOUNTER — Ambulatory Visit (HOSPITAL_BASED_OUTPATIENT_CLINIC_OR_DEPARTMENT_OTHER): Payer: Medicare Other | Admitting: Oncology

## 2011-11-11 ENCOUNTER — Other Ambulatory Visit (HOSPITAL_BASED_OUTPATIENT_CLINIC_OR_DEPARTMENT_OTHER): Payer: Medicare Other | Admitting: Lab

## 2011-11-11 VITALS — BP 105/72 | HR 97 | Temp 98.0°F | Ht 65.0 in | Wt 219.6 lb

## 2011-11-11 DIAGNOSIS — K449 Diaphragmatic hernia without obstruction or gangrene: Secondary | ICD-10-CM

## 2011-11-11 DIAGNOSIS — C50919 Malignant neoplasm of unspecified site of unspecified female breast: Secondary | ICD-10-CM

## 2011-11-11 DIAGNOSIS — Z780 Asymptomatic menopausal state: Secondary | ICD-10-CM

## 2011-11-11 DIAGNOSIS — M545 Low back pain: Secondary | ICD-10-CM

## 2011-11-11 DIAGNOSIS — Z5112 Encounter for antineoplastic immunotherapy: Secondary | ICD-10-CM

## 2011-11-11 DIAGNOSIS — K219 Gastro-esophageal reflux disease without esophagitis: Secondary | ICD-10-CM

## 2011-11-11 DIAGNOSIS — C50319 Malignant neoplasm of lower-inner quadrant of unspecified female breast: Secondary | ICD-10-CM

## 2011-11-11 LAB — COMPREHENSIVE METABOLIC PANEL
ALT: 19 U/L (ref 0–35)
AST: 26 U/L (ref 0–37)
Albumin: 4 g/dL (ref 3.5–5.2)
Alkaline Phosphatase: 97 U/L (ref 39–117)
Calcium: 8.6 mg/dL (ref 8.4–10.5)
Chloride: 101 mEq/L (ref 96–112)
Potassium: 4.1 mEq/L (ref 3.5–5.3)
Sodium: 138 mEq/L (ref 135–145)
Total Protein: 6.8 g/dL (ref 6.0–8.3)

## 2011-11-11 LAB — CBC WITH DIFFERENTIAL/PLATELET
BASO%: 0.2 % (ref 0.0–2.0)
EOS%: 0 % (ref 0.0–7.0)
HCT: 29.1 % — ABNORMAL LOW (ref 34.8–46.6)
HGB: 9.7 g/dL — ABNORMAL LOW (ref 11.6–15.9)
MCH: 28.8 pg (ref 25.1–34.0)
MCHC: 33.3 g/dL (ref 31.5–36.0)
MONO#: 0.6 10*3/uL (ref 0.1–0.9)
RDW: 14 % (ref 11.2–14.5)
WBC: 16.5 10*3/uL — ABNORMAL HIGH (ref 3.9–10.3)
lymph#: 1 10*3/uL (ref 0.9–3.3)

## 2011-11-11 MED ORDER — HEPARIN SOD (PORK) LOCK FLUSH 100 UNIT/ML IV SOLN
500.0000 [IU] | Freq: Once | INTRAVENOUS | Status: AC | PRN
  Administered 2011-11-11: 500 [IU]
  Filled 2011-11-11: qty 5

## 2011-11-11 MED ORDER — SODIUM CHLORIDE 0.9 % IV SOLN: Freq: Once | INTRAVENOUS | Status: DC

## 2011-11-11 MED ORDER — TRASTUZUMAB CHEMO INJECTION 440 MG
2.0000 mg/kg | Freq: Once | INTRAVENOUS | Status: AC
  Administered 2011-11-11: 189 mg via INTRAVENOUS
  Filled 2011-11-11: qty 9

## 2011-11-11 MED ORDER — OXYCODONE-ACETAMINOPHEN 5-325 MG PO TABS: 1.0000 | ORAL_TABLET | Freq: Four times a day (QID) | ORAL | Status: DC | PRN

## 2011-11-11 MED ORDER — DIPHENHYDRAMINE HCL 25 MG PO CAPS
50.0000 mg | ORAL_CAPSULE | Freq: Once | ORAL | Status: AC
  Administered 2011-11-11: 50 mg via ORAL

## 2011-11-11 MED ORDER — SODIUM CHLORIDE 0.9 % IJ SOLN
10.0000 mL | INTRAMUSCULAR | Status: DC | PRN
  Administered 2011-11-11: 10 mL
  Filled 2011-11-11: qty 10

## 2011-11-11 MED ORDER — ACETAMINOPHEN 325 MG PO TABS
650.0000 mg | ORAL_TABLET | Freq: Once | ORAL | Status: AC
  Administered 2011-11-11: 650 mg via ORAL

## 2011-11-11 MED ORDER — OMEPRAZOLE 40 MG PO CPDR: 40.0000 mg | DELAYED_RELEASE_CAPSULE | Freq: Every day | ORAL | Status: DC

## 2011-11-11 NOTE — Patient Instructions (Signed)
Proceed with herceptin today  Return on 6/19 for taxotere/carbplatin/herceptin  Call with any problems

## 2011-11-11 NOTE — Progress Notes (Signed)
OFFICE PROGRESS NOTE  CC  [name], MD [address] [facility] 4th [address] [place] [address] Dr. Claud Kelp  DIAGNOSIS: 1 year ol female with new diagnosis of stage I ER-/PR-/Her2Neu positive invasive ductal breast cancer  PRIOR THERAPY: 1. 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 ER negative PR negative Her2 Neu positive with Ki -67 53%,   2. S/P porta cath palcement for chemotherapy  #3 patient has begun her adjuvant chemotherapy consisting of Taxotere carboplatinum and Herceptin. Her treatment began in May 2013. A total of 4 cycles of Taxotere carboplatinum and Herceptin combination are planned. Once she completes this she will then proceed to radiation therapy with concomitant Herceptin to be given every 3 weeks to finish out a year of treatment.  CURRENT THERAPY: Today patient is here for Herceptin only  INTERVAL HISTORY: [name] 66 y.o. female returns for follow up Prior to administration of Herceptin. Thus far she is tolerating her chemotherapy well. Today however she does have some complaints about her left ear feeling funny and she has some watering from her left eye. She also also noted some fullness in the left axilla at her surgical site there is no tenderness noted however. She is suffering from chronic back pain she's been on pain medications in the past that were given to her by Dr. Everardo All she is requesting refill of these pain medications. She also suffers from reflux and she is on omeprazole and a refill is being requested. She otherwise denies any nausea vomiting fevers chills she has no abdominal pain no bleeding. No chest pains no palpitations no myalgias arthralgias or peripheral paresthesias. Remainder of the 10 point review of systems is negative.  MEDICAL HISTORY: Past Medical History  Diagnosis Date  . Obesity   . PUD (peptic ulcer disease)   . Leukopenia   .  Abdominal pain   . Short bowel syndrome   . Internal hemorrhoids without mention of complication   . Stricture and stenosis of esophagus   . Osteoarthrosis, unspecified whether generalized or localized, unspecified site   . Thyrotoxicosis without mention of goiter or other cause, without mention of thyrotoxic crisis or storm   . Other and unspecified hyperlipidemia   . Gout, unspecified   . Diverticulosis of colon (without mention of hemorrhage)   . C. difficile colitis   . VITAMIN B12 DEFICIENCY 08/30/2009  . GOITER, MULTINODULAR 04/02/2009  . HYPOTHYROIDISM, POST-RADIATION 08/13/2009  . ASYMPTOMATIC POSTMENOPAUSAL STATUS 10/11/2008  . Esophageal reflux 06/12/2008  . PONV (postoperative nausea and vomiting)   . Varicose veins   . Type II or unspecified type diabetes mellitus without mention of complication, not stated as uncontrolled     no med in years diet controled  . Blood transfusion   . UTI (urinary tract infection)   . Kidney stones     "several"  . Breast cancer 09/29/11    left  . Unspecified essential hypertension   . Angina   . Shortness of breath on exertion     "sometimes"  . Anemia   . ANEMIA, IRON DEFICIENCY 05/08/2009  . History of lower GI bleeding   . H/O hiatal hernia   . Migraines     ALLERGIES:  is allergic to aspirin; iodine; and morphine and related.  MEDICATIONS:  Current Outpatient Prescriptions  Medication Sig Dispense Refill  . Calcium Carbonate-Vitamin D (CALCIUM-VITAMIN D) 600-200 MG-UNIT CAPS Take 1 capsule  by mouth daily.        . cyanocobalamin (,VITAMIN B-12,) 1000 MCG/ML injection Inject 1,000 mcg into the muscle every 30 (thirty) days. Next one due the 26th of this month      . cyclobenzaprine (FLEXERIL) 5 MG tablet Take 5 mg by mouth 3 (three) times daily as needed.      Marland Kitchen dexamethasone (DECADRON) 4 MG tablet Take 2 tablets two times a day the day before Taxotere. Then take 2 tabs two times a day starting the day after chemo for 3 days.  30  tablet  1  . levothyroxine (SYNTHROID, LEVOTHROID) 125 MCG tablet Take 125 mcg by mouth daily.      Marland Kitchen lidocaine-prilocaine (EMLA) cream Apply topically as needed.  30 g  0  . LORazepam (ATIVAN) 0.5 MG tablet Take 1 tablet (0.5 mg total) by mouth every 6 (six) hours as needed (Nausea or vomiting).  30 tablet  0  . lovastatin (MEVACOR) 20 MG tablet Take 20 mg by mouth at bedtime.      . magnesium chloride (SLOW-MAG) 64 MG TBEC Take 2 tablets by mouth daily.        . nitroGLYCERIN (NITROSTAT) 0.4 MG SL tablet Place 0.4 mg under the tongue every 5 (five) minutes as needed. For chest pain      . omeprazole (PRILOSEC) 40 MG capsule Take 1 capsule (40 mg total) by mouth daily.  30 capsule  6  . ondansetron (ZOFRAN) 8 MG tablet Take 1 tablet two times a day starting the day after chemo for 3 days. Then take 1 tab two times a day as needed for nausea or vomiting.  30 tablet  1  . oxyCODONE-acetaminophen (PERCOCET) 5-325 MG per tablet Take 1 tablet by mouth every 6 (six) hours as needed. For pain  60 tablet  0  . prochlorperazine (COMPAZINE) 10 MG tablet Take 10 mg by mouth every 6 (six) hours as needed.      . prochlorperazine (COMPAZINE) 10 MG tablet Take 1 tablet (10 mg total) by mouth every 6 (six) hours as needed (Nausea or vomiting).  30 tablet  1  . prochlorperazine (COMPAZINE) 25 MG suppository Place 25 mg rectally every 12 (twelve) hours as needed.      . prochlorperazine (COMPAZINE) 25 MG suppository Place 1 suppository (25 mg total) rectally every 12 (twelve) hours as needed for nausea.  12 suppository  3  . promethazine (PHENERGAN) 25 MG tablet Take 25 mg by mouth every 8 (eight) hours as needed. For nausea      . thiamine 100 MG tablet Take 100 mg by mouth daily.        . trazodone (DESYREL) 300 MG tablet Take 300 mg by mouth at bedtime.        SURGICAL HISTORY:  Past Surgical History  Procedure Date  . Vein ligation and stripping 1980's    Right leg  . Lithotripsy     "4 or 5 times"  .  Bunionectomy 1970's    bilateral  . Colonoscopy   . Esophagogastroduodenoscopy 07/15/2005  . Thyroid ultrasound 12/1994 and 12/1995  . Mastectomy w/ nodes partial 09/29/11    left  . Port a cath placement 09/29/11    right chest  . Breast surgery   . Cholecystectomy 1990's  . Abdominal hysterectomy 1970's    with BSO  . Dilation and curettage of uterus   . Colon surgery     "several surgeries for short bowel syndrome"  . Abdominal  adhesion surgery 1980's thru 1990's    "several"  . Kidney stone surgery 1990's    "tried to go up & get it but pushed it further up"  . Portacath placement 09/29/2011    Procedure: INSERTION PORT-A-CATH;  Surgeon: Ernestene Mention, MD;  Location: [facility] OR;  Service: General;  Laterality: N/A;    REVIEW OF SYSTEMS:  Pertinent items are noted in HPI.   PHYSICAL EXAMINATION: General appearance: alert, cooperative and appears stated age Resp: clear to auscultation bilaterally and normal percussion bilaterally Cardio: regular rate and rhythm, S1, S2 normal, no murmur, click, rub or gallop GI: soft, non-tender; bowel sounds normal; no masses,  no organomegaly Extremities: extremities normal, atraumatic, no cyanosis or edema Neurologic: Grossly normal Bilateral Breast Exam: left breast healing incision scar, with some tenderness, no evidence of infections.Right breast no masses or nipple discharge ECOG PERFORMANCE STATUS: 1 - Symptomatic but completely ambulatory  Blood pressure 105/72, pulse 97, temperature 98 F (36.7 C), height 5\' 5"  (1.651 m), weight 219 lb 9.6 oz (99.61 kg).  LABORATORY DATA: Lab Results  Component Value Date   WBC 16.5* 11/11/2011   HGB 9.7* 11/11/2011   HCT 29.1* 11/11/2011   MCV 86.4 11/11/2011   PLT 163 11/11/2011      Chemistry      Component Value Date/Time   NA 137 11/04/2011 0901   K 3.5 11/04/2011 0901   CL 100 11/04/2011 0901   CO2 25 11/04/2011 0901   BUN 24* 11/04/2011 0901   CREATININE 0.92 11/04/2011 0901   CREATININE 0.88  03/31/2011 1550      Component Value Date/Time   CALCIUM 9.4 11/04/2011 0901   CALCIUM 9.5 05/15/2010 2153   ALKPHOS 83 11/04/2011 0901   AST 20 11/04/2011 0901   ALT 23 11/04/2011 0901   BILITOT 0.9 11/04/2011 0901     ADDITIONAL INFORMATION: 1. PROGNOSTIC INDICATORS - ACIS Results IMMUNOHISTOCHEMICAL AND MORPHOMETRIC ANALYSIS BY THE AUTOMATED CELLULAR IMAGING SYSTEM (ACIS) Estrogen Receptor (Negative, <1%): 0%, NEGATIVE Progesterone Receptor (Negative, <1%): 0%, NEGATIVE COMMENT: The negative hormone receptor study(ies) in this case have an internal positive control. All controls stained appropriately Abigail Miyamoto MD Pathologist, Electronic Signature ( Signed 10/07/2011) FINAL DIAGNOSIS 1 of 4 FINAL for [name], Rachel O ([id number]) Diagnosis 1. Breast, lumpectomy, Left - INVASIVE GRADE III, DUCTAL CARCINOMA, SPANNING 0.7 CM. - ASSOCIATED HIGH GRADED DUCTAL CARCINOMA IN SITU. - LYMPH/VASCULAR INVASION NOT IDENTIFIED. - MARGINS ARE NEGATIVE. - SEE ONCOLOGY TEMPLATE. 2. Lymph node, sentinel, biopsy, Left axillary#1 - ONE BENIGN LYMPH NODE WITH NO TUMOR (0/1). - BENIGN GLANDULAR EPITHELIAL INCLUSIONS PRESENT. - SEE COMMENT. 3. Breast, excision, Posterior margin - BENIGN BREAST PARENCHYMA. - NO ATYPIA, HYPERPLASIA, OR MALIGNANCY IDENTIFIED. 4. Lymph node, sentinel, biopsy, Left axillary #2 - ONE BENIGN LYMPH NODE WITH NO TUMOR SEEN (0/1). Microscopic Comment 1. BREAST, INVASIVE TUMOR, WITH LYMPH NODE SAMPLING Specimen, including laterality: Left breast with posterior margin and sentinel lymph nodes. Procedure: Left breast lumpectomy with posterior margin excision and sentinel lymph node biopsies. Grade: III. Tubule formation: 3. Nuclear pleomorphism: 3. Mitotic:3. Tumor size (gross measurement and glass slide measurement): 0.7 cm. Margins: Invasive, distance to closest margin: At least 0.8 cm. In-situ, distance to closest margin: At least 0.8 cm. Lymphovascular  invasion: Not identified. Ductal carcinoma in situ: Yes. Grade: High grade. Extensive intraductal component: No. Lobular neoplasia: No. Tumor focality: Unifocal. Treatment effect: N/A. Extent of tumor: Confined to breast parenchyma. Lymph nodes: # examined: 2. Lymph nodes with metastasis: 0. Breast  prognostic profile: Performed on previous case ([id number]) Estrogen receptor: 0%, negative. Progesterone receptor: 0%, negative. Her 2 neu: 3.09, amplified. Ki-67: 53%. Non-neoplastic breast: Fat necrosis present. TNM: pT1b, pN0, MX. Comments: An estrogen receptor and progesterone receptor will be repeated on the current tumor and reported in an addendum. (RAH:gt, 10/01/11) 2. The left sentinel axillary lymph node #1 shows benign glandular epithelial inclusions. Some of the inclusions are ciliated. The nuclei are bland appearing and are not malignant. Smooth muscle myosin, p63 and calponin immunohistochemical stains are performed which do not show a myoepithelial layer. 2 of 4 FINAL for [name], Rachel CATALA ([id number]) Microscopic Comment(continued) Although this is the case, the inclusions are benign and do not represent metastatic carcinoma. Both Dr. Frederica Kuster and Dr. Colonel Bald have seen the left sentinel axillary lymph node in consultation with agreement that the inclusions are benign and do not represent metastatic carcinoma. Zandra Abts MD Pathologist, Electronic Signature (Case signed 10/02/2011) Specimen Gross and Clinical Information Specimen(s) Obtained: 1. Breast, lumpectomy, Left 2. Lymph node, sentinel, biopsy, Left axillary#1 3. Breast, excision, Posterior margin 4. Lymph node, sentinel, biopsy, Left axillary #2 Specimen Clinical  RADIOGRAPHIC STUDIES:  ASSESSMENT: 66 year old with   1. 0.7 cm high grade invasive ductal carcinoma that is ER-, PR- Her2Neu +, sentinel node negative (T1bN0) pathologic stage I.Because patient is HER-2 positive she is receiving adjuvant  chemotherapy and Herceptin. She is going to be receiving 4 cycles of TCH combination and then Herceptin alone to complete out 1years worth of treatment.  #2 lower back pain  #3 reflux  PLAN:   #1 we will proceed with her scheduled Herceptin today. Overall she is tolerating it well.  #2 patient is slightly anemic we will continue to monitor this. If she requires transfusion for symptomatic anemia we certainly can do that.  #3 patient was given a prescription for oxycodone for her ongoing chronic back pain.  #4 she was also given prescription for omeprazole for her gastroesophageal reflux disease.  #5 patient will return in one week's time for cycle #2 of TCH.  All questions were answered. The patient knows to call the clinic with any problems, questions or concerns. We can certainly see the patient much sooner if necessary.  I spent 25 minutes counseling the patient face to face. The total time spent in the appointment was 30 minutes.    Drue Second, MD Medical/Oncology [facility] [phone] (beeper) [phone] (Office)  11/11/2011, 9:22 AM

## 2011-11-12 ENCOUNTER — Ambulatory Visit: Payer: Medicare Other

## 2011-11-16 ENCOUNTER — Encounter (HOSPITAL_COMMUNITY): Payer: Self-pay

## 2011-11-16 ENCOUNTER — Ambulatory Visit (HOSPITAL_COMMUNITY)
Admission: RE | Admit: 2011-11-16 | Discharge: 2011-11-16 | Disposition: A | Discharge status: Home / Self Care | Payer: Medicare Other | Source: Ambulatory Visit | Attending: Internal Medicine | Admitting: Internal Medicine

## 2011-11-16 VITALS — BP 119/80 | HR 80 | Ht 65.0 in | Wt 220.8 lb

## 2011-11-16 DIAGNOSIS — C50319 Malignant neoplasm of lower-inner quadrant of unspecified female breast: Secondary | ICD-10-CM

## 2011-11-16 DIAGNOSIS — R5383 Other fatigue: Secondary | ICD-10-CM | POA: Insufficient documentation

## 2011-11-16 DIAGNOSIS — I509 Heart failure, unspecified: Secondary | ICD-10-CM | POA: Insufficient documentation

## 2011-11-16 DIAGNOSIS — R5381 Other malaise: Secondary | ICD-10-CM

## 2011-11-16 NOTE — Assessment & Plan Note (Signed)
With her weight, snoring and daytime somnolence I have suspicion for OSA. We discussed this and also the risk it would have on her heart. She doesn't want to do sleep study now. Discussed role of weight loss.

## 2011-11-16 NOTE — Progress Notes (Signed)
PCP: Dr. Everardo All Oncologist: Dr. Welton Flakes  HPI:   [name] is a 66 year old female with h/o obesity, short bowel syndrome, diet-controlled DM2, CP s/p multiple Myoviews Last in 11/12 (EF 61% normal perfusion). Referred by Dr. Park Breed for enrollment into cardio-oncology clinic.   Diagnosed May 2013 : stage I ER-/PR-/Her2Neu positive invasive ductal breast cancer  PRIOR THERAPY:  1. 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 ER negative PR negative Her2 Neu positive with Ki -67 53%,  2. S/P porta cath palcement for chemotherapy  #3 patient has begun her adjuvant chemotherapy consisting of Taxotere carboplatinum and Herceptin. Her treatment began in May 2013. A total of 4 cycles of Taxotere carboplatinum and Herceptin combination are planned.   Has gotten 3-4 cycles of herceptin already. Feels tired and weak. Very minimal DOE. Husband says she snores a lot. +daytime somnolence. No edema. Dr. Everardo All discussed with her sleep study and she refused.   Echo: 11/12 EF 50-55% lateral s' 8.4 (second peak)  Echo: 4/13 EF 45-50% lateral s' 8.5 (second peak - 1st peak not seen)   Review of Systems:     Cardiac Review of Systems: {Y] = yes [ ]  = no  Chest Pain [    ]  Resting SOB [   ] Exertional SOB  Cove.Etienne  ]  Orthopnea [  ]   Pedal Edema [   ]    Palpitations [  ] Syncope  [  ]   Presyncope [   ]  General Review of Systems: [Y] = yes [  ]=no Constitional: recent weight change [ y ]; anorexia [  ]; fatigue [  ]; nausea [  ]; night sweats [  ]; fever [  ]; or chills [  ];                                                                                                                                           Eye : blurred vision [  ]; diplopia [   ]; vision changes [  ];  Amaurosis fugax[  ]; Resp: cough [  ];  wheezing[  ];  hemoptysis[  ]; shortness of breath[  ]; paroxysmal nocturnal dyspnea[  ]; dyspnea on exertion[  ]; or  orthopnea[  ];  GI:  gallstones[  ], Diarrhea/constipation [ y ];  dysphagia[ y ]; melena[  ];  hematochezia [  ]; heartburn[  ];   Abdominal pain [ y ]; GU: kidney stones [  ]; hematuria[  ];   dysuria [  ];  nocturia[  ];  history of     obstruction [  ];                 Skin: rash, swelling[  ];, hair loss[  ];  peripheral edema[  ];  or itching[  ]; Musculosketetal: myalgias[  ];  joint swelling[  y];  joint erythema[  ];  joint pain[y  ];  back pain[  ];  Heme/Lymph: bruising[  ];  bleeding[  ];  anemia[  ];  Neuro: TIA[  ];  headaches[y  ];  stroke[  ];  vertigo[  ];  seizures[  ];   paresthesias[  ];  Dizziness Cove.Etienne  ];  Psych:depression[  ]; anxiety[ y ];  Endocrine: diabetes[ y ];  thyroid dysfunction[  ];  Other:    Past Medical History  Diagnosis Date  . Obesity   . PUD (peptic ulcer disease)   . Leukopenia   . Abdominal pain   . Short bowel syndrome   . Internal hemorrhoids without mention of complication   . Stricture and stenosis of esophagus   . Osteoarthrosis, unspecified whether generalized or localized, unspecified site   . Thyrotoxicosis without mention of goiter or other cause, without mention of thyrotoxic crisis or storm   . Other and unspecified hyperlipidemia   . Gout, unspecified   . Diverticulosis of colon (without mention of hemorrhage)   . C. difficile colitis   . VITAMIN B12 DEFICIENCY 08/30/2009  . GOITER, MULTINODULAR 04/02/2009  . HYPOTHYROIDISM, POST-RADIATION 08/13/2009  . ASYMPTOMATIC POSTMENOPAUSAL STATUS 10/11/2008  . Esophageal reflux 06/12/2008  . PONV (postoperative nausea and vomiting)   . Varicose veins   . Type II or unspecified type diabetes mellitus without mention of complication, not stated as uncontrolled     no med in years diet controled  . Blood transfusion   . UTI (urinary tract infection)   . Kidney stones     "several"  . Breast cancer 09/29/11    left  . Unspecified essential hypertension   . Angina   . Shortness of breath on  exertion     "sometimes"  . Anemia   . ANEMIA, IRON DEFICIENCY 05/08/2009  . History of lower GI bleeding   . H/O hiatal hernia   . Migraines     Current Outpatient Prescriptions  Medication Sig Dispense Refill  . Calcium Carbonate-Vitamin D (CALCIUM-VITAMIN D) 600-200 MG-UNIT CAPS Take 1 capsule by mouth daily.        . cyanocobalamin (,VITAMIN B-12,) 1000 MCG/ML injection Inject 1,000 mcg into the muscle every 30 (thirty) days. Next one due the 26th of this month      . cyclobenzaprine (FLEXERIL) 5 MG tablet Take 5 mg by mouth 3 (three) times daily as needed.      Marland Kitchen dexamethasone (DECADRON) 4 MG tablet Take 2 tablets two times a day the day before Taxotere. Then take 2 tabs two times a day starting the day after chemo for 3 days.  30 tablet  1  . levothyroxine (SYNTHROID, LEVOTHROID) 125 MCG tablet Take 125 mcg by mouth daily.      Marland Kitchen lidocaine-prilocaine (EMLA) cream Apply topically as needed.  30 g  0  . LORazepam (ATIVAN) 0.5 MG tablet Take 1 tablet (0.5 mg total) by mouth every 6 (six) hours as needed (Nausea or vomiting).  30 tablet  0  . lovastatin (MEVACOR) 20 MG tablet Take 20 mg by mouth at bedtime.      . magnesium chloride (SLOW-MAG) 64 MG TBEC Take 1 tablet by mouth daily.       Marland Kitchen omeprazole (PRILOSEC) 40 MG capsule Take 1 capsule (40 mg total) by mouth daily.  30 capsule  6  . ondansetron (ZOFRAN) 8 MG tablet Take 1  tablet two times a day starting the day after chemo for 3 days. Then take 1 tab two times a day as needed for nausea or vomiting.  30 tablet  1  . oxyCODONE-acetaminophen (PERCOCET) 5-325 MG per tablet Take 1 tablet by mouth every 6 (six) hours as needed. For pain  60 tablet  0  . prochlorperazine (COMPAZINE) 10 MG tablet Take 10 mg by mouth every 6 (six) hours as needed.      . prochlorperazine (COMPAZINE) 10 MG tablet Take 1 tablet (10 mg total) by mouth every 6 (six) hours as needed (Nausea or vomiting).  30 tablet  1  . promethazine (PHENERGAN) 25 MG tablet Take  25 mg by mouth every 8 (eight) hours as needed. For nausea      . thiamine 100 MG tablet Take 100 mg by mouth daily.        . trazodone (DESYREL) 300 MG tablet Take 300 mg by mouth at bedtime.      . nitroGLYCERIN (NITROSTAT) 0.4 MG SL tablet Place 0.4 mg under the tongue every 5 (five) minutes as needed. For chest pain      . prochlorperazine (COMPAZINE) 25 MG suppository Place 25 mg rectally every 12 (twelve) hours as needed.      . prochlorperazine (COMPAZINE) 25 MG suppository Place 1 suppository (25 mg total) rectally every 12 (twelve) hours as needed for nausea.  12 suppository  3     Allergies  Allergen Reactions  . Aspirin Other (See Comments)    REACTION: Gi Intolerance/ Burning in stomach  . Iodine Itching    Allergic to IVP dye  . Morphine And Related Itching    History   Social History  . Marital Status: Married    Spouse Name: N/A    Number of Children: N/A  . Years of Education: N/A   Occupational History  . Not on file.   Social History Main Topics  . Smoking status: Former Smoker -- 1.0 packs/day for 10 years    Types: Cigarettes    Quit date: 09/22/1985  . Smokeless tobacco: Never Used  . Alcohol Use: Yes     09/29/11 "used to drink socially years ago"  . Drug Use: No  . Sexually Active: No   Other Topics Concern  . Not on file   Social History Narrative   Pt gets regular exercise    Family History  Problem Relation Age of Onset  . Esophageal cancer Son     deceased  . Cancer Son     Esophageal Cancer  . Diabetes Mother   . Heart disease Mother   . Colon cancer Paternal Uncle   . Cancer Paternal Uncle     Colon Cancer  . Kidney disease Sister   . Diabetes Father   . Kidney disease Brother   . Kidney disease Brother   . Kidney disease Brother     PHYSICAL EXAM: Filed Vitals:   11/16/11 1058  BP: 119/80  Pulse: 80   General:  Well appearing. No respiratory difficulty HEENT: normal Neck: supple. no JVD. Carotids 2+ bilat; no bruits.  No lymphadenopathy or thryomegaly appreciated. Porta cath in place Cor: PMI nondisplaced. Regular rate & rhythm. No rubs, gallops or murmurs. Lungs: clear Abdomen: obese soft, nontender, nondistended. No hepatosplenomegaly. No bruits or masses. Good bowel sounds. Extremities: no cyanosis, clubbing, rash, edema Neuro: alert & oriented x 3, cranial nerves grossly intact. moves all 4 extremities w/o difficulty. Affect pleasant.   ASSESSMENT &  PLAN:

## 2011-11-16 NOTE — Patient Instructions (Addendum)
Your physician has requested that you have an echocardiogram. Echocardiography is a painless test that uses sound waves to create images of your heart. It provides your doctor with information about the size and shape of your heart and how well your heart's chambers and valves are working. This procedure takes approximately one hour. There are no restrictions for this procedure.  In 1 month  Your physician recommends that you schedule a follow-up appointment in: 1 month.  

## 2011-11-16 NOTE — Assessment & Plan Note (Signed)
Echocardiograms reviewed personally. EF in 11/12 around 50%. EF in 4/13 (prior to Herceptin) 45-50%. Thus it appears EF runs a bit on the low side but this is probably normal for her. Lateral s' velocity is stable. Will check repeat echo in 1 month from now (2 months into therapy) and follow closely. If any change will start ace-I and b-blocker. Discussed role of cardio-oncology clinic and 10% risk of cardiotoxicity with Herceptin.

## 2011-11-17 ENCOUNTER — Ambulatory Visit: Payer: Medicare Other

## 2011-11-18 ENCOUNTER — Ambulatory Visit (HOSPITAL_BASED_OUTPATIENT_CLINIC_OR_DEPARTMENT_OTHER): Payer: Medicare Other | Admitting: Family

## 2011-11-18 ENCOUNTER — Other Ambulatory Visit (HOSPITAL_BASED_OUTPATIENT_CLINIC_OR_DEPARTMENT_OTHER): Payer: Medicare Other | Admitting: Lab

## 2011-11-18 ENCOUNTER — Encounter: Payer: Self-pay | Admitting: Family

## 2011-11-18 ENCOUNTER — Ambulatory Visit (HOSPITAL_BASED_OUTPATIENT_CLINIC_OR_DEPARTMENT_OTHER): Payer: Medicare Other

## 2011-11-18 ENCOUNTER — Encounter: Payer: Self-pay | Admitting: *Deleted

## 2011-11-18 ENCOUNTER — Ambulatory Visit: Payer: Medicare Other

## 2011-11-18 VITALS — BP 127/83 | HR 83 | Temp 98.0°F | Ht 65.0 in | Wt 218.2 lb

## 2011-11-18 DIAGNOSIS — C50319 Malignant neoplasm of lower-inner quadrant of unspecified female breast: Secondary | ICD-10-CM

## 2011-11-18 DIAGNOSIS — C50919 Malignant neoplasm of unspecified site of unspecified female breast: Secondary | ICD-10-CM

## 2011-11-18 DIAGNOSIS — R5381 Other malaise: Secondary | ICD-10-CM

## 2011-11-18 DIAGNOSIS — Z5112 Encounter for antineoplastic immunotherapy: Secondary | ICD-10-CM

## 2011-11-18 DIAGNOSIS — R5383 Other fatigue: Secondary | ICD-10-CM

## 2011-11-18 DIAGNOSIS — Z171 Estrogen receptor negative status [ER-]: Secondary | ICD-10-CM

## 2011-11-18 LAB — CBC WITH DIFFERENTIAL/PLATELET
Eosinophils Absolute: 0 10*3/uL (ref 0.0–0.5)
LYMPH%: 6.4 % — ABNORMAL LOW (ref 14.0–49.7)
MONO#: 0.3 10*3/uL (ref 0.1–0.9)
NEUT#: 14 10*3/uL — ABNORMAL HIGH (ref 1.5–6.5)
Platelets: 202 10*3/uL (ref 145–400)
RBC: 3.37 10*6/uL — ABNORMAL LOW (ref 3.70–5.45)
WBC: 15.4 10*3/uL — ABNORMAL HIGH (ref 3.9–10.3)
nRBC: 0 % (ref 0–0)

## 2011-11-18 LAB — COMPREHENSIVE METABOLIC PANEL
ALT: 24 U/L (ref 0–35)
Albumin: 3.6 g/dL (ref 3.5–5.2)
CO2: 22 mEq/L (ref 19–32)
Calcium: 9.5 mg/dL (ref 8.4–10.5)
Chloride: 98 mEq/L (ref 96–112)
Sodium: 134 mEq/L — ABNORMAL LOW (ref 135–145)
Total Protein: 7.2 g/dL (ref 6.0–8.3)

## 2011-11-18 MED ORDER — SODIUM CHLORIDE 0.9 % IV SOLN
Freq: Once | INTRAVENOUS | Status: AC
  Administered 2011-11-18: 10:00:00 via INTRAVENOUS

## 2011-11-18 MED ORDER — DIPHENHYDRAMINE HCL 25 MG PO CAPS
50.0000 mg | ORAL_CAPSULE | Freq: Once | ORAL | Status: AC
  Administered 2011-11-18: 50 mg via ORAL

## 2011-11-18 MED ORDER — ACETAMINOPHEN 325 MG PO TABS
650.0000 mg | ORAL_TABLET | Freq: Once | ORAL | Status: AC
  Administered 2011-11-18: 650 mg via ORAL

## 2011-11-18 MED ORDER — HEPARIN SOD (PORK) LOCK FLUSH 100 UNIT/ML IV SOLN
500.0000 [IU] | Freq: Once | INTRAVENOUS | Status: AC | PRN
  Administered 2011-11-18: 500 [IU]
  Filled 2011-11-18: qty 5

## 2011-11-18 MED ORDER — SODIUM CHLORIDE 0.9 % IV SOLN
540.0000 mg | Freq: Once | INTRAVENOUS | Status: AC
  Administered 2011-11-18: 540 mg via INTRAVENOUS
  Filled 2011-11-18: qty 54

## 2011-11-18 MED ORDER — DOCETAXEL CHEMO INJECTION 160 MG/16ML
75.0000 mg/m2 | Freq: Once | INTRAVENOUS | Status: AC
  Administered 2011-11-18: 160 mg via INTRAVENOUS
  Filled 2011-11-18: qty 16

## 2011-11-18 MED ORDER — SODIUM CHLORIDE 0.9 % IJ SOLN
10.0000 mL | INTRAMUSCULAR | Status: DC | PRN
  Administered 2011-11-18: 10 mL
  Filled 2011-11-18: qty 10

## 2011-11-18 MED ORDER — TRASTUZUMAB CHEMO INJECTION 440 MG
2.0000 mg/kg | Freq: Once | INTRAVENOUS | Status: AC
  Administered 2011-11-18: 189 mg via INTRAVENOUS
  Filled 2011-11-18: qty 9

## 2011-11-18 MED ORDER — DEXAMETHASONE SODIUM PHOSPHATE 4 MG/ML IJ SOLN
20.0000 mg | Freq: Once | INTRAMUSCULAR | Status: AC
  Administered 2011-11-18: 20 mg via INTRAVENOUS

## 2011-11-18 MED ORDER — ONDANSETRON 16 MG/50ML IVPB (CHCC)
16.0000 mg | Freq: Once | INTRAVENOUS | Status: AC
  Administered 2011-11-18: 16 mg via INTRAVENOUS

## 2011-11-18 NOTE — Patient Instructions (Signed)
[facility] Discharge Instructions for Patients Receiving Chemotherapy  Today you received the following chemotherapy agents Taxotere, Carboplatin, Herceptin  To help prevent nausea and vomiting after your treatment, we encourage you to take your nausea medication  Begin taking it at 7pm and take it as often as prescribed for the next 24-72 hours.   If you develop nausea and vomiting that is not controlled by your nausea medication, call the clinic. If it is after clinic hours your family physician or the after hours number for the clinic or go to the Emergency Department.   BELOW ARE SYMPTOMS THAT SHOULD BE REPORTED IMMEDIATELY:  *FEVER GREATER THAN 100.5 F  *CHILLS WITH OR WITHOUT FEVER  NAUSEA AND VOMITING THAT IS NOT CONTROLLED WITH YOUR NAUSEA MEDICATION  *UNUSUAL SHORTNESS OF BREATH  *UNUSUAL BRUISING OR BLEEDING  TENDERNESS IN MOUTH AND THROAT WITH OR WITHOUT PRESENCE OF ULCERS  *URINARY PROBLEMS  *BOWEL PROBLEMS  UNUSUAL RASH Items with * indicate a potential emergency and should be followed up as soon as possible.  One of the nurses will contact you 24 hours after your treatment. Please let the nurse know about any problems that you may have experienced. Feel free to call the clinic you have any questions or concerns. The clinic phone number is [phone].   I have been informed and understand all the instructions given to me. I know to contact the clinic, my physician, or go to the Emergency Department if any problems should occur. I do not have any questions at this time, but understand that I may call the clinic during office hours   should I have any questions or need assistance in obtaining follow up care.    __________________________________________  _____________  __________ Signature of Patient or Authorized Representative            Date                   Time    __________________________________________ Nurse's Signature

## 2011-11-18 NOTE — Progress Notes (Signed)
  OFFICE PROGRESS NOTE  CC: [name], MD Dr. 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 ER negative PR negative Her2 Neu positive with Ki -67 53%,  T1b N0.   PRIOR THERAPY: 1. Partial mastectomy, left breast with SNL.   2. Porta cath placement for chemotherapy 09/29/11.  3. Genetic testing: BRCA and BART negative.   CURRENT THERAPY:Adjuvant chemotherapy [facility])  INTERVAL HISTORY: Completed cycle one of TC 10/31/11. Feels well today, has alternating days of fatigue and adequate energy. Husband thinks she "over does it when she feels well".  Experiencing taste changes. Has poor appetite, although continues to cook.  Received Neulasta with first cycle of chemo, no significant bone pain. Had reflux following chemo with dyspepsia. Uses Prilosec with moderate results.   No headache or blurred vision. No cough or shortness of breath. No abdominal pain or new bone pain. Bowel and bladder function are normal. Appetite is good, with adequate fluid intake. Remainder of the 10 point  review of systems is negative.  ALLERGIES:  Aspirin, iodine, morphine and related.  PHYSICAL EXAMINATION:  General: Well developed, well nourished, in no acute distress. Accompanied by her husband who is engaged and supportive.  EENT: No ocular or oral lesions. No stomatitis.  Respiratory: Lungs are clear to auscultation bilaterally with normal respiratory movement and no accessory muscle use. Cardiac: No murmur, rub or tachycardia. No upper or lower extremity edema.  GI: Abdomen is soft, no palpable hepatosplenomegaly. No fluid wave. No tenderness. Musculoskeletal: No kyphosis, no tenderness over the spine, ribs or hips. Lymph: No cervical, infraclavicular, axillary or inguinal adenopathy. Neuro: No focal neurological deficits. Psych: Alert and oriented X 3, appropriate mood and affect.    Blood pressure  127/83, pulse 83, temperature 98 F (36.7 C), height 5\' 5"  (1.651 m), weight 218 lb 3.2 oz (98.975 kg).  LABORATORY DATA: Lab Results  Component Value Date   WBC 15.4* 11/18/2011   HGB 9.7* 11/18/2011   HCT 29.1* 11/18/2011   MCV 86.4 11/18/2011   PLT 202 11/18/2011   ASSESSMENT: 66 year old with:  1. Left breast cancer, completed one cycle of TCH.  2. Fatigue 3. Constipation improved with MOM.  4. Echo scheduled for July 2013.   PLAN:  1. Taxotere/Carbo/Herceptin today.  2. Return 6/26 to see Dr. Welton Flakes and Herceptin only. Standing orders for labs are entered.     All questions were answered. The patient knows to call the clinic with any problems, questions or concerns.

## 2011-11-18 NOTE — Progress Notes (Signed)
Saw patient today in infusion. No concerns/needs voiced at this time.  States she is doing okay.  Appointment calendar provided.  She understands to call with questions.

## 2011-11-19 ENCOUNTER — Ambulatory Visit (HOSPITAL_BASED_OUTPATIENT_CLINIC_OR_DEPARTMENT_OTHER): Payer: Medicare Other

## 2011-11-19 VITALS — BP 136/86 | HR 75 | Temp 96.6°F

## 2011-11-19 DIAGNOSIS — Z5189 Encounter for other specified aftercare: Secondary | ICD-10-CM

## 2011-11-19 DIAGNOSIS — C50319 Malignant neoplasm of lower-inner quadrant of unspecified female breast: Secondary | ICD-10-CM

## 2011-11-19 MED ORDER — PEGFILGRASTIM INJECTION 6 MG/0.6ML
6.0000 mg | Freq: Once | SUBCUTANEOUS | Status: AC
  Administered 2011-11-19: 6 mg via SUBCUTANEOUS
  Filled 2011-11-19: qty 0.6

## 2011-11-24 ENCOUNTER — Ambulatory Visit: Payer: Medicare Other

## 2011-11-25 ENCOUNTER — Ambulatory Visit (HOSPITAL_BASED_OUTPATIENT_CLINIC_OR_DEPARTMENT_OTHER): Payer: Medicare Other | Admitting: Oncology

## 2011-11-25 ENCOUNTER — Encounter: Payer: Self-pay | Admitting: Oncology

## 2011-11-25 ENCOUNTER — Telehealth: Payer: Self-pay | Admitting: *Deleted

## 2011-11-25 ENCOUNTER — Ambulatory Visit: Payer: Medicare Other

## 2011-11-25 ENCOUNTER — Other Ambulatory Visit (HOSPITAL_BASED_OUTPATIENT_CLINIC_OR_DEPARTMENT_OTHER): Payer: Medicare Other | Admitting: Lab

## 2011-11-25 ENCOUNTER — Ambulatory Visit (HOSPITAL_BASED_OUTPATIENT_CLINIC_OR_DEPARTMENT_OTHER): Payer: Medicare Other

## 2011-11-25 VITALS — BP 109/75 | HR 93 | Temp 97.8°F | Ht 65.0 in | Wt 211.6 lb

## 2011-11-25 DIAGNOSIS — Z5112 Encounter for antineoplastic immunotherapy: Secondary | ICD-10-CM

## 2011-11-25 DIAGNOSIS — R112 Nausea with vomiting, unspecified: Secondary | ICD-10-CM

## 2011-11-25 DIAGNOSIS — E860 Dehydration: Secondary | ICD-10-CM | POA: Insufficient documentation

## 2011-11-25 DIAGNOSIS — C50319 Malignant neoplasm of lower-inner quadrant of unspecified female breast: Secondary | ICD-10-CM

## 2011-11-25 DIAGNOSIS — Z171 Estrogen receptor negative status [ER-]: Secondary | ICD-10-CM

## 2011-11-25 LAB — CBC WITH DIFFERENTIAL/PLATELET
BASO%: 0.9 % (ref 0.0–2.0)
Eosinophils Absolute: 0 10*3/uL (ref 0.0–0.5)
HCT: 29.3 % — ABNORMAL LOW (ref 34.8–46.6)
LYMPH%: 28.1 % (ref 14.0–49.7)
MONO#: 1.3 10*3/uL — ABNORMAL HIGH (ref 0.1–0.9)
NEUT#: 1.8 10*3/uL (ref 1.5–6.5)
NEUT%: 42 % (ref 38.4–76.8)
Platelets: 224 10*3/uL (ref 145–400)
RBC: 3.47 10*6/uL — ABNORMAL LOW (ref 3.70–5.45)
WBC: 4.3 10*3/uL (ref 3.9–10.3)
lymph#: 1.2 10*3/uL (ref 0.9–3.3)

## 2011-11-25 LAB — COMPREHENSIVE METABOLIC PANEL
ALT: 12 U/L (ref 0–35)
CO2: 22 mEq/L (ref 19–32)
Calcium: 9.1 mg/dL (ref 8.4–10.5)
Chloride: 101 mEq/L (ref 96–112)
Glucose, Bld: 96 mg/dL (ref 70–99)
Sodium: 136 mEq/L (ref 135–145)
Total Bilirubin: 0.5 mg/dL (ref 0.3–1.2)
Total Protein: 6.9 g/dL (ref 6.0–8.3)

## 2011-11-25 MED ORDER — FAMOTIDINE IN NACL 20-0.9 MG/50ML-% IV SOLN
20.0000 mg | Freq: Once | INTRAVENOUS | Status: AC
  Administered 2011-11-25: 20 mg via INTRAVENOUS

## 2011-11-25 MED ORDER — ONDANSETRON 16 MG/50ML IVPB (CHCC)
16.0000 mg | Freq: Once | INTRAVENOUS | Status: AC
  Administered 2011-11-25: 16 mg via INTRAVENOUS

## 2011-11-25 MED ORDER — SODIUM CHLORIDE 0.9 % IV SOLN
1000.0000 mL | Freq: Once | INTRAVENOUS | Status: AC
  Administered 2011-11-25: 1000 mL via INTRAVENOUS

## 2011-11-25 MED ORDER — TRASTUZUMAB CHEMO INJECTION 440 MG
2.0000 mg/kg | Freq: Once | INTRAVENOUS | Status: AC
  Administered 2011-11-25: 189 mg via INTRAVENOUS
  Filled 2011-11-25: qty 9

## 2011-11-25 MED ORDER — HEPARIN SOD (PORK) LOCK FLUSH 100 UNIT/ML IV SOLN
500.0000 [IU] | Freq: Once | INTRAVENOUS | Status: AC | PRN
  Administered 2011-11-25: 500 [IU]
  Filled 2011-11-25: qty 5

## 2011-11-25 MED ORDER — SODIUM CHLORIDE 0.9 % IJ SOLN
10.0000 mL | INTRAMUSCULAR | Status: DC | PRN
  Administered 2011-11-25: 10 mL
  Filled 2011-11-25: qty 10

## 2011-11-25 MED ORDER — OXYCODONE-ACETAMINOPHEN 5-325 MG PO TABS: 1.0000 | ORAL_TABLET | Freq: Four times a day (QID) | ORAL | Status: DC | PRN

## 2011-11-25 MED ORDER — DIPHENHYDRAMINE HCL 25 MG PO CAPS
50.0000 mg | ORAL_CAPSULE | Freq: Once | ORAL | Status: AC
  Administered 2011-11-25: 50 mg via ORAL

## 2011-11-25 MED ORDER — ACETAMINOPHEN 325 MG PO TABS
650.0000 mg | ORAL_TABLET | Freq: Once | ORAL | Status: AC
  Administered 2011-11-25: 650 mg via ORAL

## 2011-11-25 MED ORDER — SODIUM CHLORIDE 0.9 % IV SOLN: Freq: Once | INTRAVENOUS | Status: DC

## 2011-11-25 MED ORDER — ONDANSETRON 16 MG/50ML IVPB (CHCC): 16.0000 mg | Freq: Once | INTRAVENOUS | Status: DC

## 2011-11-25 NOTE — Patient Instructions (Signed)
1. Proceed with herceptin today  2. You will receive IVF today as well as anti-nausea medications  3. I will see you back in 1 week.  4. Please call if you continue to be sick

## 2011-11-25 NOTE — Telephone Encounter (Signed)
Sent michelle email to added on 12-10-2011 iv fluids

## 2011-11-25 NOTE — Progress Notes (Signed)
OFFICE PROGRESS NOTE  CC  [name], MD [address] [place] [address] Dr. 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 ER negative PR negative Her2 Neu positive with Ki -67 53%,   2. S/P porta cath palcement for chemotherapy  #3 patient has begun her adjuvant chemotherapy consisting of Taxotere carboplatinum and Herceptin. Her treatment began in May 2013. A total of 4 cycles of Taxotere carboplatinum and Herceptin combination are planned. Once she completes this she will then proceed to radiation therapy with concomitant Herceptin to be given every 3 weeks to finish out a year of treatment.  CURRENT THERAPY: Today patient is here for Herceptin only  INTERVAL HISTORY: [name] 66 y.o. female returns for follow up Prior to administration of Herceptin. She received cycle #2 of Taxotere or and carboplatinum along with Herceptin. Today she feels very tired weak fatigued she has not been drinking as much. She was also significantly nauseous with some episodes of vomiting. She has myalgias and she is complaining of some arthralgias she has her chronic lower back pain. However she denies any fevers chills she has not had any night sweats no bleeding problems no chest pains or shortness of breath. Remainder of the 10 point review of systems is negative.  MEDICAL HISTORY: Past Medical History  Diagnosis Date  . Obesity   . PUD (peptic ulcer disease)   . Leukopenia   . Abdominal pain   . Short bowel syndrome   . Internal hemorrhoids without mention of complication   . Stricture and stenosis of esophagus   . Osteoarthrosis, unspecified whether generalized or localized, unspecified site   . Thyrotoxicosis without mention of goiter or  other cause, without mention of thyrotoxic crisis or storm   . Other and unspecified hyperlipidemia   . Gout, unspecified   . Diverticulosis of colon (without mention of hemorrhage)   . C. difficile colitis   . VITAMIN B12 DEFICIENCY 08/30/2009  . GOITER, MULTINODULAR 04/02/2009  . HYPOTHYROIDISM, POST-RADIATION 08/13/2009  . ASYMPTOMATIC POSTMENOPAUSAL STATUS 10/11/2008  . Esophageal reflux 06/12/2008  . PONV (postoperative nausea and vomiting)   . Varicose veins   . Type II or unspecified type diabetes mellitus without mention of complication, not stated as uncontrolled     no med in years diet controled  . Blood transfusion   . UTI (urinary tract infection)   . Kidney stones     "several"  . Breast cancer 09/29/11    left  . Unspecified essential hypertension   . Angina   . Shortness of breath on exertion     "sometimes"  . Anemia   . ANEMIA, IRON DEFICIENCY 05/08/2009  . History of lower GI bleeding   . H/O hiatal hernia   . Migraines     ALLERGIES:  is allergic to aspirin; iodine; and morphine and related.  MEDICATIONS:  Current Outpatient Prescriptions  Medication Sig Dispense Refill  . Calcium Carbonate-Vitamin D (CALCIUM-VITAMIN D) 600-200 MG-UNIT CAPS Take 1 capsule by mouth daily.        . cyanocobalamin (,VITAMIN B-12,) 1000 MCG/ML injection Inject 1,000 mcg into the muscle every 30 (thirty) days. Next one due the 26th of this month      . cyclobenzaprine (FLEXERIL) 5 MG tablet Take  5 mg by mouth 3 (three) times daily as needed.      Marland Kitchen dexamethasone (DECADRON) 4 MG tablet Take 2 tablets two times a day the day before Taxotere. Then take 2 tabs two times a day starting the day after chemo for 3 days.  30 tablet  1  . levothyroxine (SYNTHROID, LEVOTHROID) 125 MCG tablet Take 125 mcg by mouth daily.      Marland Kitchen lidocaine-prilocaine (EMLA) cream Apply topically as needed.  30 g  0  . LORazepam (ATIVAN) 0.5 MG tablet Take 1 tablet (0.5 mg total) by mouth every 6 (six) hours as  needed (Nausea or vomiting).  30 tablet  0  . lovastatin (MEVACOR) 20 MG tablet Take 20 mg by mouth at bedtime.      . magnesium chloride (SLOW-MAG) 64 MG TBEC Take 1 tablet by mouth daily.       Marland Kitchen omeprazole (PRILOSEC) 40 MG capsule Take 1 capsule (40 mg total) by mouth daily.  30 capsule  6  . ondansetron (ZOFRAN) 8 MG tablet Take 1 tablet two times a day starting the day after chemo for 3 days. Then take 1 tab two times a day as needed for nausea or vomiting.  30 tablet  1  . oxyCODONE-acetaminophen (PERCOCET) 5-325 MG per tablet Take 1 tablet by mouth every 6 (six) hours as needed. For pain  60 tablet  0  . prochlorperazine (COMPAZINE) 10 MG tablet Take 10 mg by mouth every 6 (six) hours as needed.      . prochlorperazine (COMPAZINE) 10 MG tablet Take 1 tablet (10 mg total) by mouth every 6 (six) hours as needed (Nausea or vomiting).  30 tablet  1  . prochlorperazine (COMPAZINE) 25 MG suppository Place 25 mg rectally every 12 (twelve) hours as needed.      . prochlorperazine (COMPAZINE) 25 MG suppository Place 1 suppository (25 mg total) rectally every 12 (twelve) hours as needed for nausea.  12 suppository  3  . promethazine (PHENERGAN) 25 MG tablet Take 25 mg by mouth every 8 (eight) hours as needed. For nausea      . thiamine 100 MG tablet Take 100 mg by mouth daily.        . trazodone (DESYREL) 300 MG tablet Take 300 mg by mouth at bedtime.      . nitroGLYCERIN (NITROSTAT) 0.4 MG SL tablet Place 0.4 mg under the tongue every 5 (five) minutes as needed. For chest pain       Current Facility-Administered Medications  Medication Dose Route Frequency Provider Last Rate Last Dose  . 0.9 %  sodium chloride infusion  1,000 mL Intravenous Once Victorino December, MD      . famotidine (PEPCID) IVPB 20 mg  20 mg Intravenous Once Victorino December, MD      . ondansetron (ZOFRAN) IVPB 16 mg  16 mg Intravenous Once Victorino December, MD        SURGICAL HISTORY:  Past Surgical History  Procedure Date  .  Vein ligation and stripping 1980's    Right leg  . Lithotripsy     "4 or 5 times"  . Bunionectomy 1970's    bilateral  . Colonoscopy   . Esophagogastroduodenoscopy 07/15/2005  . Thyroid ultrasound 12/1994 and 12/1995  . Mastectomy w/ nodes partial 09/29/11    left  . Port a cath placement 09/29/11    right chest  . Breast surgery   . Cholecystectomy 1990's  . Abdominal hysterectomy 1970's  with BSO  . Dilation and curettage of uterus   . Colon surgery     "several surgeries for short bowel syndrome"  . Abdominal adhesion surgery 1980's thru 1990's    "several"  . Kidney stone surgery 1990's    "tried to go up & get it but pushed it further up"  . Portacath placement 09/29/2011    Procedure: INSERTION PORT-A-CATH;  Surgeon: Ernestene Mention, MD;  Location: [facility] OR;  Service: General;  Laterality: N/A;    REVIEW OF SYSTEMS:  Pertinent items are noted in HPI.   PHYSICAL EXAMINATION: General appearance: alert, cooperative and appears stated age Resp: clear to auscultation bilaterally and normal percussion bilaterally Cardio: regular rate and rhythm, S1, S2 normal, no murmur, click, rub or gallop GI: soft, non-tender; bowel sounds normal; no masses,  no organomegaly Extremities: extremities normal, atraumatic, no cyanosis or edema Neurologic: Grossly normal Bilateral Breast Exam: left breast healing incision scar, with some tenderness, no evidence of infections.Right breast no masses or nipple discharge ECOG PERFORMANCE STATUS: 1 - Symptomatic but completely ambulatory  Blood pressure 109/75, pulse 93, temperature 97.8 F (36.6 C), height 5\' 5"  (1.651 m), weight 211 lb 9.6 oz (95.981 kg).  LABORATORY DATA: Lab Results  Component Value Date   WBC 4.3 11/25/2011   HGB 10.0* 11/25/2011   HCT 29.3* 11/25/2011   MCV 84.4 11/25/2011   PLT 224 11/25/2011      Chemistry      Component Value Date/Time   NA 134* 11/18/2011 0835   K 3.9 11/18/2011 0835   CL 98 11/18/2011 0835   CO2 22  11/18/2011 0835   BUN 31* 11/18/2011 0835   CREATININE 1.31* 11/18/2011 0835   CREATININE 0.88 03/31/2011 1550      Component Value Date/Time   CALCIUM 9.5 11/18/2011 0835   CALCIUM 9.5 05/15/2010 2153   ALKPHOS 98 11/18/2011 0835   AST 22 11/18/2011 0835   ALT 24 11/18/2011 0835   BILITOT 0.3 11/18/2011 0835     ADDITIONAL INFORMATION: 1. PROGNOSTIC INDICATORS - ACIS Results IMMUNOHISTOCHEMICAL AND MORPHOMETRIC ANALYSIS BY THE AUTOMATED CELLULAR IMAGING SYSTEM (ACIS) Estrogen Receptor (Negative, <1%): 0%, NEGATIVE Progesterone Receptor (Negative, <1%): 0%, NEGATIVE COMMENT: The negative hormone receptor study(ies) in this case have an internal positive control. All controls stained appropriately Abigail Miyamoto MD Pathologist, Electronic Signature ( Signed 10/07/2011) FINAL DIAGNOSIS 1 of 4 FINAL for Schier, Victorious O ([id number]) Diagnosis 1. Breast, lumpectomy, Left - INVASIVE GRADE III, DUCTAL CARCINOMA, SPANNING 0.7 CM. - ASSOCIATED HIGH GRADED DUCTAL CARCINOMA IN SITU. - LYMPH/VASCULAR INVASION NOT IDENTIFIED. - MARGINS ARE NEGATIVE. - SEE ONCOLOGY TEMPLATE. 2. Lymph node, sentinel, biopsy, Left axillary#1 - ONE BENIGN LYMPH NODE WITH NO TUMOR (0/1). - BENIGN GLANDULAR EPITHELIAL INCLUSIONS PRESENT. - SEE COMMENT. 3. Breast, excision, Posterior margin - BENIGN BREAST PARENCHYMA. - NO ATYPIA, HYPERPLASIA, OR MALIGNANCY IDENTIFIED. 4. Lymph node, sentinel, biopsy, Left axillary #2 - ONE BENIGN LYMPH NODE WITH NO TUMOR SEEN (0/1). Microscopic Comment 1. BREAST, INVASIVE TUMOR, WITH LYMPH NODE SAMPLING Specimen, including laterality: Left breast with posterior margin and sentinel lymph nodes. Procedure: Left breast lumpectomy with posterior margin excision and sentinel lymph node biopsies. Grade: III. Tubule formation: 3. Nuclear pleomorphism: 3. Mitotic:3. Tumor size (gross measurement and glass slide measurement): 0.7 cm. Margins: Invasive, distance to closest  margin: At least 0.8 cm. In-situ, distance to closest margin: At least 0.8 cm. Lymphovascular invasion: Not identified. Ductal carcinoma in situ: Yes. Grade: High grade. Extensive intraductal component: No.  Lobular neoplasia: No. Tumor focality: Unifocal. Treatment effect: N/A. Extent of tumor: Confined to breast parenchyma. Lymph nodes: # examined: 2. Lymph nodes with metastasis: 0. Breast prognostic profile: Performed on previous case ([id number]) Estrogen receptor: 0%, negative. Progesterone receptor: 0%, negative. Her 2 neu: 3.09, amplified. Ki-67: 53%. Non-neoplastic breast: Fat necrosis present. TNM: pT1b, pN0, MX. Comments: An estrogen receptor and progesterone receptor will be repeated on the current tumor and reported in an addendum. (RAH:gt, 10/01/11) 2. The left sentinel axillary lymph node #1 shows benign glandular epithelial inclusions. Some of the inclusions are ciliated. The nuclei are bland appearing and are not malignant. Smooth muscle myosin, p63 and calponin immunohistochemical stains are performed which do not show a myoepithelial layer. 2 of 4 FINAL for Ewing, MAYLENE CROCKER ([id number]) Microscopic Comment(continued) Although this is the case, the inclusions are benign and do not represent metastatic carcinoma. Both Dr. Frederica Kuster and Dr. Colonel Bald have seen the left sentinel axillary lymph node in consultation with agreement that the inclusions are benign and do not represent metastatic carcinoma. Zandra Abts MD Pathologist, Electronic Signature (Case signed 10/02/2011) Specimen Gross and Clinical Information Specimen(s) Obtained: 1. Breast, lumpectomy, Left 2. Lymph node, sentinel, biopsy, Left axillary#1 3. Breast, excision, Posterior margin 4. Lymph node, sentinel, biopsy, Left axillary #2 Specimen Clinical  RADIOGRAPHIC STUDIES:  ASSESSMENT: 66 year old with   1. 0.7 cm high grade invasive ductal carcinoma that is ER-, PR- Her2Neu +, sentinel node  negative (T1bN0) pathologic stage I.Because patient is HER-2 positive she is receiving adjuvant chemotherapy and Herceptin. She is going to be receiving 4 cycles of TCH combination and then Herceptin alone to complete out 1years worth of treatment.  #2 nausea and vomiting secondary most likely due to her cycle #2 of chemotherapy.  #3 dehydration due to poor intake because of nausea and vomiting.  PLAN:   #1 patient is due for Herceptin today and we will proceed with it. As far as her adjuvant chemotherapy part is concerned that is the Taxotere or and carboplatinum I will dose reduce her with the Taxotere at 50 mg per meter squared and the carboplatinum at an AUC of 4 for her next 2 cycles.  #2 patient will receive IV fluids as well as antiemetics today.  #3 she will return in one week's time. This will be for Herceptin. Her chemotherapy is due on July 10. With day 2 Neulasta on July 11. On July 11 I will plan on giving her IV fluids preemptively.  #4 patient was given a prescription for Percocet #60.  All questions were answered. The patient knows to call the clinic with any problems, questions or concerns. We can certainly see the patient much sooner if necessary.  I spent 25 minutes counseling the patient face to face. The total time spent in the appointment was 30 minutes.    Drue Second, MD Medical/Oncology [facility] [phone] (beeper) [phone] (Office)  11/25/2011, 9:48 AM

## 2011-11-25 NOTE — Patient Instructions (Signed)
[facility] Discharge Instructions for Patients Receiving Chemotherapy  Today you received the following chemotherapy agents Herceptin, IV fluids, nausea medications, Pepcid.  To help prevent nausea and vomiting after your treatment, we encourage you to take your nausea medication. Begin taking it at 5pm and take it as often as prescribed for the next 24-72 hours.   If you develop nausea and vomiting that is not controlled by your nausea medication, call the clinic. If it is after clinic hours your family physician or the after hours number for the clinic or go to the Emergency Department.   BELOW ARE SYMPTOMS THAT SHOULD BE REPORTED IMMEDIATELY:  *FEVER GREATER THAN 100.5 F  *CHILLS WITH OR WITHOUT FEVER  NAUSEA AND VOMITING THAT IS NOT CONTROLLED WITH YOUR NAUSEA MEDICATION  *UNUSUAL SHORTNESS OF BREATH  *UNUSUAL BRUISING OR BLEEDING  TENDERNESS IN MOUTH AND THROAT WITH OR WITHOUT PRESENCE OF ULCERS  *URINARY PROBLEMS  *BOWEL PROBLEMS  UNUSUAL RASH Items with * indicate a potential emergency and should be followed up as soon as possible.  One of the nurses will contact you 24 hours after your treatment. Please let the nurse know about any problems that you may have experienced. Feel free to call the clinic you have any questions or concerns. The clinic phone number is [phone].   I have been informed and understand all the instructions given to me. I know to contact the clinic, my physician, or go to the Emergency Department if any problems should occur. I do not have any questions at this time, but understand that I may call the clinic during office hours   should I have any questions or need assistance in obtaining follow up care.    __________________________________________  _____________  __________ Signature of Patient or Authorized Representative            Date                   Time    __________________________________________ Nurse's  Signature

## 2011-11-25 NOTE — Telephone Encounter (Signed)
Per staff message I have scheduled appt. JMW 

## 2011-11-26 ENCOUNTER — Telehealth: Payer: Self-pay | Admitting: *Deleted

## 2011-11-26 ENCOUNTER — Ambulatory Visit (INDEPENDENT_AMBULATORY_CARE_PROVIDER_SITE_OTHER): Payer: Medicare Other

## 2011-11-26 ENCOUNTER — Ambulatory Visit: Payer: Medicare Other

## 2011-11-26 DIAGNOSIS — E538 Deficiency of other specified B group vitamins: Secondary | ICD-10-CM

## 2011-11-26 MED ORDER — CYANOCOBALAMIN 1000 MCG/ML IJ SOLN
1000.0000 ug | Freq: Once | INTRAMUSCULAR | Status: AC
  Administered 2011-11-26: 1000 ug via INTRAMUSCULAR

## 2011-11-26 MED ORDER — POTASSIUM CHLORIDE CRYS ER 20 MEQ PO TBCR: 20.0000 meq | EXTENDED_RELEASE_TABLET | Freq: Every day | ORAL | Status: DC

## 2011-11-26 NOTE — Telephone Encounter (Signed)
Per MD, notified pt to take K-Dur 20 meq po daily x 6 days. Pt verbalized understanding and confirmed pharmacy for rx to be sent.

## 2011-11-26 NOTE — Telephone Encounter (Signed)
Message copied by Cooper Render on Thu Nov 26, 2011  9:18 AM ------      Message from: Victorino December      Created: Wed Nov 25, 2011  4:17 PM       Call patient: K-dur 20 meq po daily x 6

## 2011-11-27 ENCOUNTER — Ambulatory Visit: Payer: Medicare Other

## 2011-11-30 ENCOUNTER — Other Ambulatory Visit: Payer: Self-pay | Admitting: Certified Registered Nurse Anesthetist

## 2011-12-01 ENCOUNTER — Other Ambulatory Visit: Payer: Self-pay | Admitting: Oncology

## 2011-12-01 ENCOUNTER — Ambulatory Visit: Payer: Medicare Other

## 2011-12-01 DIAGNOSIS — C50319 Malignant neoplasm of lower-inner quadrant of unspecified female breast: Secondary | ICD-10-CM

## 2011-12-01 DIAGNOSIS — R112 Nausea with vomiting, unspecified: Secondary | ICD-10-CM

## 2011-12-02 ENCOUNTER — Ambulatory Visit (HOSPITAL_BASED_OUTPATIENT_CLINIC_OR_DEPARTMENT_OTHER): Payer: Medicare Other | Admitting: Oncology

## 2011-12-02 ENCOUNTER — Telehealth: Payer: Self-pay | Admitting: Medical Oncology

## 2011-12-02 ENCOUNTER — Other Ambulatory Visit: Payer: Self-pay | Admitting: *Deleted

## 2011-12-02 ENCOUNTER — Other Ambulatory Visit: Payer: Self-pay | Admitting: Medical Oncology

## 2011-12-02 ENCOUNTER — Telehealth: Payer: Self-pay | Admitting: Oncology

## 2011-12-02 ENCOUNTER — Encounter: Payer: Self-pay | Admitting: Oncology

## 2011-12-02 ENCOUNTER — Encounter (HOSPITAL_COMMUNITY)
Admission: RE | Admit: 2011-12-02 | Discharge: 2011-12-02 | Disposition: A | Discharge status: Home / Self Care | Payer: Medicare Other | Source: Ambulatory Visit | Attending: Oncology | Admitting: Oncology

## 2011-12-02 ENCOUNTER — Ambulatory Visit: Payer: Medicare Other

## 2011-12-02 ENCOUNTER — Encounter: Payer: Self-pay | Admitting: *Deleted

## 2011-12-02 ENCOUNTER — Ambulatory Visit (HOSPITAL_BASED_OUTPATIENT_CLINIC_OR_DEPARTMENT_OTHER): Payer: Medicare Other

## 2011-12-02 ENCOUNTER — Telehealth: Payer: Self-pay | Admitting: *Deleted

## 2011-12-02 ENCOUNTER — Other Ambulatory Visit (HOSPITAL_BASED_OUTPATIENT_CLINIC_OR_DEPARTMENT_OTHER): Payer: Medicare Other

## 2011-12-02 ENCOUNTER — Ambulatory Visit (HOSPITAL_COMMUNITY)
Admission: RE | Admit: 2011-12-02 | Discharge: 2011-12-02 | Disposition: A | Discharge status: Home / Self Care | Payer: Medicare Other | Source: Ambulatory Visit | Attending: Oncology | Admitting: Oncology

## 2011-12-02 VITALS — BP 120/83 | HR 98 | Temp 98.2°F | Ht 65.0 in | Wt 217.9 lb

## 2011-12-02 DIAGNOSIS — M79609 Pain in unspecified limb: Secondary | ICD-10-CM | POA: Insufficient documentation

## 2011-12-02 DIAGNOSIS — C50319 Malignant neoplasm of lower-inner quadrant of unspecified female breast: Secondary | ICD-10-CM

## 2011-12-02 DIAGNOSIS — Z171 Estrogen receptor negative status [ER-]: Secondary | ICD-10-CM

## 2011-12-02 DIAGNOSIS — Z5112 Encounter for antineoplastic immunotherapy: Secondary | ICD-10-CM

## 2011-12-02 DIAGNOSIS — D649 Anemia, unspecified: Secondary | ICD-10-CM

## 2011-12-02 DIAGNOSIS — Z09 Encounter for follow-up examination after completed treatment for conditions other than malignant neoplasm: Secondary | ICD-10-CM

## 2011-12-02 LAB — CBC WITH DIFFERENTIAL/PLATELET
Basophils Absolute: 0 10*3/uL (ref 0.0–0.1)
Eosinophils Absolute: 0 10*3/uL (ref 0.0–0.5)
HCT: 26.3 % — ABNORMAL LOW (ref 34.8–46.6)
HGB: 8.8 g/dL — ABNORMAL LOW (ref 11.6–15.9)
MCV: 86.2 fL (ref 79.5–101.0)
MONO%: 5.8 % (ref 0.0–14.0)
NEUT#: 11.4 10*3/uL — ABNORMAL HIGH (ref 1.5–6.5)
NEUT%: 80.3 % — ABNORMAL HIGH (ref 38.4–76.8)
RDW: 14.9 % — ABNORMAL HIGH (ref 11.2–14.5)

## 2011-12-02 LAB — COMPREHENSIVE METABOLIC PANEL
Albumin: 3.9 g/dL (ref 3.5–5.2)
BUN: 16 mg/dL (ref 6–23)
CO2: 25 mEq/L (ref 19–32)
Glucose, Bld: 102 mg/dL — ABNORMAL HIGH (ref 70–99)
Potassium: 3.3 mEq/L — ABNORMAL LOW (ref 3.5–5.3)
Sodium: 137 mEq/L (ref 135–145)
Total Bilirubin: 0.4 mg/dL (ref 0.3–1.2)
Total Protein: 6.5 g/dL (ref 6.0–8.3)

## 2011-12-02 MED ORDER — LORAZEPAM 0.5 MG PO TABS: 0.5000 mg | ORAL_TABLET | Freq: Four times a day (QID) | ORAL | Status: DC | PRN

## 2011-12-02 MED ORDER — DIPHENHYDRAMINE HCL 25 MG PO CAPS
50.0000 mg | ORAL_CAPSULE | Freq: Once | ORAL | Status: AC
  Administered 2011-12-02: 50 mg via ORAL

## 2011-12-02 MED ORDER — ACETAMINOPHEN 325 MG PO TABS
650.0000 mg | ORAL_TABLET | Freq: Once | ORAL | Status: AC
  Administered 2011-12-02: 650 mg via ORAL

## 2011-12-02 MED ORDER — TRASTUZUMAB CHEMO INJECTION 440 MG
2.0000 mg/kg | Freq: Once | INTRAVENOUS | Status: AC
  Administered 2011-12-02: 189 mg via INTRAVENOUS
  Filled 2011-12-02: qty 9

## 2011-12-02 MED ORDER — SODIUM CHLORIDE 0.9 % IV SOLN
Freq: Once | INTRAVENOUS | Status: AC
  Administered 2011-12-02: 10:00:00 via INTRAVENOUS

## 2011-12-02 MED ORDER — SODIUM CHLORIDE 0.9 % IJ SOLN
10.0000 mL | INTRAMUSCULAR | Status: DC | PRN
  Administered 2011-12-02: 10 mL
  Filled 2011-12-02: qty 10

## 2011-12-02 MED ORDER — HEPARIN SOD (PORK) LOCK FLUSH 100 UNIT/ML IV SOLN
500.0000 [IU] | Freq: Once | INTRAVENOUS | Status: AC | PRN
  Administered 2011-12-02: 500 [IU]
  Filled 2011-12-02: qty 5

## 2011-12-02 NOTE — Telephone Encounter (Signed)
Message copied by Tylene Fantasia on Wed Dec 02, 2011  3:32 PM ------      Message from: Victorino December      Created: Wed Dec 02, 2011  2:34 PM       Please call patient: begin K-dur 20 mEq po daily x 4 days

## 2011-12-02 NOTE — Progress Notes (Signed)
*  Preliminary Results* Right lower extremity venous duplex completed. Right lower extremity is negative for deep vein thrombosis. Preliminary results reviewed with Morrie Sheldon of Dr. Milta Deiters office.  12/02/2011 11:39 AM Gertie Fey, RDMS, RDCS

## 2011-12-02 NOTE — Telephone Encounter (Signed)
On call: patient called as husband is at [employer] and no prescription has been called in by our office. EMR notes this was to be KDur 20 mEq daily x 4, which I have called directly to pharmacist now. Apologized to patient.  Ila Mcgill, MD

## 2011-12-02 NOTE — Telephone Encounter (Signed)
Received report from [place], [place]-  Venous doppler negative for DVT in right lower extremity

## 2011-12-02 NOTE — Progress Notes (Signed)
OFFICE PROGRESS NOTE  CC  [name], MD [address] [facility] [address] [place] [address] Dr. Claud Kelp  DIAGNOSIS: 45 year ol female with new diagnosis of stage I ER-/PR-/Her2Neu positive invasive ductal breast cancer  PRIOR THERAPY: 1. 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 ER negative PR negative Her2 Neu positive with Ki -67 53%,   2. S/P porta cath palcement for chemotherapy  #3 patient has begun her adjuvant chemotherapy consisting of Taxotere carboplatinum and Herceptin. Her treatment began in May 2013. A total of 4 cycles of Taxotere carboplatinum and Herceptin combination are planned. Once she completes this she will then proceed to radiation therapy with concomitant Herceptin to be given every 3 weeks to finish out a year of treatment.  CURRENT THERAPY: Today patient is here for Herceptin only  INTERVAL HISTORY: [name] 66 y.[name]. female returns for follow up. Patient's main complaint is shortness of breath on exertion. She is also feeling nauseous and anxious. She is asking for a prescription for her entire medics as well as her in his IV medication. She otherwise denies any headaches double vision blurring of vision difficulty in swallowing she has no bleeding problems. She does have some numbness in her fingertips and her toes that is transient. She has not had any chest pains or palpitations. She does have some myalgias and arthralgias. Remainder of the review of systems is unremarkable. MEDICAL HISTORY: Past Medical History  Diagnosis Date  . Obesity   . PUD (peptic ulcer disease)   . Leukopenia   . Abdominal pain   . Short bowel syndrome   . Internal hemorrhoids without mention of complication   . Stricture and stenosis of esophagus   . Osteoarthrosis, unspecified whether generalized or localized, unspecified site   . Thyrotoxicosis without mention of goiter or other  cause, without mention of thyrotoxic crisis or storm   . Other and unspecified hyperlipidemia   . Gout, unspecified   . Diverticulosis of colon (without mention of hemorrhage)   . C. difficile colitis   . VITAMIN B12 DEFICIENCY 08/30/2009  . GOITER, MULTINODULAR 04/02/2009  . HYPOTHYROIDISM, POST-RADIATION 08/13/2009  . ASYMPTOMATIC POSTMENOPAUSAL STATUS 10/11/2008  . Esophageal reflux 06/12/2008  . PONV (postoperative nausea and vomiting)   . Varicose veins   . Type II or unspecified type diabetes mellitus without mention of complication, not stated as uncontrolled     no med in years diet controled  . Blood transfusion   . UTI (urinary tract infection)   . Kidney stones     "several"  . Breast cancer 09/29/11    left  . Unspecified essential hypertension   . Angina   . Shortness of breath on exertion     "sometimes"  . Anemia   . ANEMIA, IRON DEFICIENCY 05/08/2009  . History of lower GI bleeding   . H/[name] hiatal hernia   . Migraines     ALLERGIES:  is allergic to aspirin; iodine; and morphine and related.  MEDICATIONS:  Current Outpatient Prescriptions  Medication Sig Dispense Refill  . Calcium Carbonate-Vitamin D (CALCIUM-VITAMIN D) 600-200 MG-UNIT CAPS Take 1 capsule by mouth daily.        . cyanocobalamin (,VITAMIN B-12,) 1000 MCG/ML injection Inject 1,000 mcg into the muscle every 30 (thirty) days. Next one due the 26th of this month      . cyclobenzaprine (FLEXERIL) 5 MG tablet Take 5 mg  by mouth 3 (three) times daily as needed.      Marland Kitchen dexamethasone (DECADRON) 4 MG tablet TAKE 2 TABLETS TWICE DAILY DAY PRIOR TO TREATMENT, DAY OF AND DAY AFTER  30 tablet  2  . levothyroxine (SYNTHROID, LEVOTHROID) 125 MCG tablet Take 125 mcg by mouth daily.      Marland Kitchen lidocaine-prilocaine (EMLA) cream Apply topically as needed.  30 g  0  . lovastatin (MEVACOR) 20 MG tablet Take 20 mg by mouth at bedtime.      . magnesium chloride (SLOW-MAG) 64 MG TBEC Take 1 tablet by mouth daily.       .  nitroGLYCERIN (NITROSTAT) 0.4 MG SL tablet Place 0.4 mg under the tongue every 5 (five) minutes as needed. For chest pain      . omeprazole (PRILOSEC) 40 MG capsule Take 1 capsule (40 mg total) by mouth daily.  30 capsule  6  . ondansetron (ZOFRAN) 8 MG tablet Take 1 tablet two times a day starting the day after chemo for 3 days. Then take 1 tab two times a day as needed for nausea or vomiting.  30 tablet  1  . oxyCODONE-acetaminophen (PERCOCET) 5-325 MG per tablet Take 1 tablet by mouth every 6 (six) hours as needed. For pain  60 tablet  0  . potassium chloride SA (K-DUR,KLOR-CON) 20 MEQ tablet Take 1 tablet (20 mEq total) by mouth daily.  6 tablet  0  . prochlorperazine (COMPAZINE) 10 MG tablet Take 10 mg by mouth every 6 (six) hours as needed.      . prochlorperazine (COMPAZINE) 10 MG tablet Take 1 tablet (10 mg total) by mouth every 6 (six) hours as needed (Nausea or vomiting).  30 tablet  1  . prochlorperazine (COMPAZINE) 10 MG tablet TAKE 1 TABLET (10 MG TOTAL) BY MOUTH EVERY 6 (SIX) HOURS AS NEEDED.  30 tablet  2  . prochlorperazine (COMPAZINE) 25 MG suppository Place 25 mg rectally every 12 (twelve) hours as needed.      . prochlorperazine (COMPAZINE) 25 MG suppository Place 1 suppository (25 mg total) rectally every 12 (twelve) hours as needed for nausea.  12 suppository  3  . promethazine (PHENERGAN) 25 MG tablet Take 25 mg by mouth every 8 (eight) hours as needed. For nausea      . thiamine 100 MG tablet Take 100 mg by mouth daily.        . trazodone (DESYREL) 300 MG tablet Take 300 mg by mouth at bedtime.      Marland Kitchen LORazepam (ATIVAN) 0.5 MG tablet Take 1 tablet (0.5 mg total) by mouth every 6 (six) hours as needed (Nausea or vomiting).  30 tablet  0    SURGICAL HISTORY:  Past Surgical History  Procedure Date  . Vein ligation and stripping 1980's    Right leg  . Lithotripsy     "4 or 5 times"  . Bunionectomy 1970's    bilateral  . Colonoscopy   . Esophagogastroduodenoscopy 07/15/2005   . Thyroid ultrasound 12/1994 and 12/1995  . Mastectomy w/ nodes partial 09/29/11    left  . Port a cath placement 09/29/11    right chest  . Breast surgery   . Cholecystectomy 1990's  . Abdominal hysterectomy 1970's    with BSO  . Dilation and curettage of uterus   . Colon surgery     "several surgeries for short bowel syndrome"  . Abdominal adhesion surgery 1980's thru 1990's    "several"  . Kidney stone surgery  1990's    "tried to go up & get it but pushed it further up"  . Portacath placement 09/29/2011    Procedure: INSERTION PORT-A-CATH;  Surgeon: Ernestene Mention, MD;  Location: [facility] OR;  Service: General;  Laterality: N/A;    REVIEW OF SYSTEMS:  Pertinent items are noted in HPI.   PHYSICAL EXAMINATION: General appearance: alert, cooperative and appears stated age Resp: clear to auscultation bilaterally and normal percussion bilaterally Cardio: regular rate and rhythm, S1, S2 normal, no murmur, click, rub or gallop GI: soft, non-tender; bowel sounds normal; no masses,  no organomegaly Extremities: extremities normal, atraumatic, no cyanosis or edema Neurologic: Grossly normal Bilateral Breast Exam: left breast healing incision scar, with some tenderness, no evidence of infections.Right breast no masses or nipple discharge ECOG PERFORMANCE STATUS: 1 - Symptomatic but completely ambulatory  Blood pressure 120/83, pulse 98, temperature 98.2 F (36.8 C), temperature source Oral, height 5\' 5"  (1.651 m), weight 217 lb 14.4 oz (98.839 kg).  LABORATORY DATA: Lab Results  Component Value Date   WBC 14.2* 12/02/2011   HGB 8.8* 12/02/2011   HCT 26.3* 12/02/2011   MCV 86.2 12/02/2011   PLT 113* 12/02/2011      Chemistry      Component Value Date/Time   NA 136 11/25/2011 0902   K 3.0* 11/25/2011 0902   CL 101 11/25/2011 0902   CO2 22 11/25/2011 0902   BUN 28* 11/25/2011 0902   CREATININE 1.31* 11/25/2011 0902   CREATININE 0.88 03/31/2011 1550      Component Value Date/Time   CALCIUM 9.1  11/25/2011 0902   CALCIUM 9.5 05/15/2010 2153   ALKPHOS 92 11/25/2011 0902   AST 18 11/25/2011 0902   ALT 12 11/25/2011 0902   BILITOT 0.5 11/25/2011 0902     ADDITIONAL INFORMATION: 1. PROGNOSTIC INDICATORS - ACIS Results IMMUNOHISTOCHEMICAL AND MORPHOMETRIC ANALYSIS BY THE AUTOMATED CELLULAR IMAGING SYSTEM (ACIS) Estrogen Receptor (Negative, <1%): 0%, NEGATIVE Progesterone Receptor (Negative, <1%): 0%, NEGATIVE COMMENT: The negative hormone receptor study(ies) in this case have an internal positive control. All controls stained appropriately Abigail Miyamoto MD Pathologist, Electronic Signature ( Signed 10/07/2011) FINAL DIAGNOSIS 1 of 4 FINAL for [name], [name] ([id number]) Diagnosis 1. Breast, lumpectomy, Left - INVASIVE GRADE III, DUCTAL CARCINOMA, SPANNING 0.7 CM. - ASSOCIATED HIGH GRADED DUCTAL CARCINOMA IN SITU. - LYMPH/VASCULAR INVASION NOT IDENTIFIED. - MARGINS ARE NEGATIVE. - SEE ONCOLOGY TEMPLATE. 2. Lymph node, sentinel, biopsy, Left axillary#1 - ONE BENIGN LYMPH NODE WITH NO TUMOR (0/1). - BENIGN GLANDULAR EPITHELIAL INCLUSIONS PRESENT. - SEE COMMENT. 3. Breast, excision, Posterior margin - BENIGN BREAST PARENCHYMA. - NO ATYPIA, HYPERPLASIA, OR MALIGNANCY IDENTIFIED. 4. Lymph node, sentinel, biopsy, Left axillary #2 - ONE BENIGN LYMPH NODE WITH NO TUMOR SEEN (0/1). Microscopic Comment 1. BREAST, INVASIVE TUMOR, WITH LYMPH NODE SAMPLING Specimen, including laterality: Left breast with posterior margin and sentinel lymph nodes. Procedure: Left breast lumpectomy with posterior margin excision and sentinel lymph node biopsies. Grade: III. Tubule formation: 3. Nuclear pleomorphism: 3. Mitotic:3. Tumor size (gross measurement and glass slide measurement): 0.7 cm. Margins: Invasive, distance to closest margin: At least 0.8 cm. In-situ, distance to closest margin: At least 0.8 cm. Lymphovascular invasion: Not identified. Ductal carcinoma in situ: Yes. Grade:  High grade. Extensive intraductal component: No. Lobular neoplasia: No. Tumor focality: Unifocal. Treatment effect: N/A. Extent of tumor: Confined to breast parenchyma. Lymph nodes: # examined: 2. Lymph nodes with metastasis: 0. Breast prognostic profile: Performed on previous case ([id number]) Estrogen receptor: 0%, negative.  Progesterone receptor: 0%, negative. Her 2 neu: 3.09, amplified. Ki-67: 53%. Non-neoplastic breast: Fat necrosis present. TNM: pT1b, pN0, MX. Comments: An estrogen receptor and progesterone receptor will be repeated on the current tumor and reported in an addendum. (RAH:gt, 10/01/11) 2. The left sentinel axillary lymph node #1 shows benign glandular epithelial inclusions. Some of the inclusions are ciliated. The nuclei are bland appearing and are not malignant. Smooth muscle myosin, p63 and calponin immunohistochemical stains are performed which do not show a myoepithelial layer. 2 of 4 FINAL for [name], [name] ([id number]) Microscopic Comment(continued) Although this is the case, the inclusions are benign and do not represent metastatic carcinoma. Both Dr. Frederica Kuster and Dr. Colonel Bald have seen the left sentinel axillary lymph node in consultation with agreement that the inclusions are benign and do not represent metastatic carcinoma. Zandra Abts MD Pathologist, Electronic Signature (Case signed 10/02/2011) Specimen Gross and Clinical Information Specimen(s) Obtained: 1. Breast, lumpectomy, Left 2. Lymph node, sentinel, biopsy, Left axillary#1 3. Breast, excision, Posterior margin 4. Lymph node, sentinel, biopsy, Left axillary #2 Specimen Clinical  RADIOGRAPHIC STUDIES:  ASSESSMENT: 66 year old with   1. 0.7 cm high grade invasive ductal carcinoma that is ER-, PR- Her2Neu +, sentinel node negative (T1bN0) pathologic stage I.Because patient is HER-2 positive she is receiving adjuvant chemotherapy and Herceptin. She is going to be receiving 4 cycles of  TCH combination and then Herceptin alone to complete out 1years worth of treatment.  #2 nausea and vomiting secondary most likely due to her cycle #2 of chemotherapy.  #3 patient does have symptomatic anemia with a hemoglobin of 8.8 she is short of breath on exertion and even has been having difficulty doing some housework.  PLAN:  #1 patient will proceed with her Herceptin today.  #2 she is symptomatic from her anemia and we will give HER-2 units of packed red cells on Friday and I do think that this will help her significantly. Risks and benefits of transfusion were discussed with the patient.  #3 patient will return in one week's time for Taxotere carboplatinum and Herceptin.  All questions were answered. The patient knows to call the clinic with any problems, questions or concerns. We can certainly see the patient much sooner if necessary.  I spent 25 minutes counseling the patient face to face. The total time spent in the appointment was 30 minutes.    Drue Second, MD Medical/Oncology [facility] [phone] (beeper) [phone] (Office)  12/02/2011, 8:53 AM

## 2011-12-02 NOTE — Telephone Encounter (Signed)
Per MD, patient to begin taking K-Dur 20 mEq daily by mouth for four days.  Patient expressed understanding, no further questions.

## 2011-12-02 NOTE — Progress Notes (Signed)
Spoke with patient today who says she is doing much better.  Denies problems at this time.  Wants to know about injection for tomorrow with center closing.  Encouraged her to follow-up with infusion nurse today for guidance.  She understands to call with questions.  All questions answered.

## 2011-12-02 NOTE — Patient Instructions (Addendum)
[facility] Discharge Instructions for Patients Receiving Chemotherapy  Today you received the following chemotherapy agents Herceptin  To help prevent nausea and vomiting after your treatment, we encourage you to take your nausea medication Begin taking it at 7 pm and take it as often as prescribed for the next 24 to 72 hours.   If you develop nausea and vomiting that is not controlled by your nausea medication, call the clinic. If it is after clinic hours your family physician or the after hours number for the clinic or go to the Emergency Department.   BELOW ARE SYMPTOMS THAT SHOULD BE REPORTED IMMEDIATELY:  *FEVER GREATER THAN 100.5 F  *CHILLS WITH OR WITHOUT FEVER  NAUSEA AND VOMITING THAT IS NOT CONTROLLED WITH YOUR NAUSEA MEDICATION  *UNUSUAL SHORTNESS OF BREATH  *UNUSUAL BRUISING OR BLEEDING  TENDERNESS IN MOUTH AND THROAT WITH OR WITHOUT PRESENCE OF ULCERS  *URINARY PROBLEMS  *BOWEL PROBLEMS  UNUSUAL RASH Items with * indicate a potential emergency and should be followed up as soon as possible.  One of the nurses will contact you 24 hours after your treatment. Please let the nurse know about any problems that you may have experienced. Feel free to call the clinic you have any questions or concerns. The clinic phone number is [phone].   I have been informed and understand all the instructions given to me. I know to contact the clinic, my physician, or go to the Emergency Department if any problems should occur. I do not have any questions at this time, but understand that I may call the clinic during office hours   should I have any questions or need assistance in obtaining follow up care.    __________________________________________  _____________  __________ Signature of Patient or Authorized Representative            Date                   Time    __________________________________________ Nurse's Signature    

## 2011-12-02 NOTE — Patient Instructions (Addendum)
1. Doppler ultrasound of right leg today  2. Blood transfusion in the next day or two  3. I will see you back next Wednesday for chemotherapy

## 2011-12-02 NOTE — Telephone Encounter (Signed)
Pt has her appt for the dopplar for today at [facility] and for the blood transfusion on Friday. Pt will pick up the rest of her appt schedules at that time

## 2011-12-02 NOTE — Telephone Encounter (Signed)
Faxed refill request received for lorazepam.  Request to MD for review.

## 2011-12-04 ENCOUNTER — Ambulatory Visit (HOSPITAL_BASED_OUTPATIENT_CLINIC_OR_DEPARTMENT_OTHER): Payer: Medicare Other

## 2011-12-04 ENCOUNTER — Telehealth: Payer: Self-pay | Admitting: Medical Oncology

## 2011-12-04 VITALS — BP 116/75 | HR 83 | Temp 98.5°F | Resp 16

## 2011-12-04 DIAGNOSIS — C50319 Malignant neoplasm of lower-inner quadrant of unspecified female breast: Secondary | ICD-10-CM

## 2011-12-04 DIAGNOSIS — D649 Anemia, unspecified: Secondary | ICD-10-CM

## 2011-12-04 MED ORDER — SODIUM CHLORIDE 0.9 % IJ SOLN
10.0000 mL | INTRAMUSCULAR | Status: AC | PRN
  Administered 2011-12-04: 10 mL
  Filled 2011-12-04: qty 10

## 2011-12-04 MED ORDER — DIPHENHYDRAMINE HCL 25 MG PO CAPS
25.0000 mg | ORAL_CAPSULE | Freq: Once | ORAL | Status: AC
  Administered 2011-12-04: 25 mg via ORAL

## 2011-12-04 MED ORDER — ACETAMINOPHEN 325 MG PO TABS
650.0000 mg | ORAL_TABLET | Freq: Once | ORAL | Status: AC
  Administered 2011-12-04: 650 mg via ORAL

## 2011-12-04 MED ORDER — SODIUM CHLORIDE 0.9 % IV SOLN
250.0000 mL | Freq: Once | INTRAVENOUS | Status: AC
  Administered 2011-12-04: 250 mL via INTRAVENOUS

## 2011-12-04 MED ORDER — HEPARIN SOD (PORK) LOCK FLUSH 100 UNIT/ML IV SOLN
500.0000 [IU] | Freq: Every day | INTRAVENOUS | Status: AC | PRN
  Administered 2011-12-04: 500 [IU]
  Filled 2011-12-04: qty 5

## 2011-12-04 NOTE — Telephone Encounter (Signed)
Message copied by Tylene Fantasia on Fri Dec 04, 2011  8:50 AM ------      Message from: Victorino December      Created: Fri Dec 04, 2011  6:55 AM       Please call patient: no blood clot

## 2011-12-04 NOTE — Telephone Encounter (Signed)
LMOVM per MD, no blood clot.  Instructed patient to call with any questions.

## 2011-12-04 NOTE — Patient Instructions (Signed)
Blood Transfusion Information  WHAT IS A BLOOD TRANSFUSION?  A transfusion is the replacement of blood or some of its parts. Blood is made up of multiple cells which provide different functions.   Red blood cells carry oxygen and are used for blood loss replacement.   White blood cells fight against infection.   Platelets control bleeding.   Plasma helps clot blood.   Other blood products are available for specialized needs, such as hemophilia or other clotting disorders.  BEFORE THE TRANSFUSION   Who gives blood for transfusions?    You may be able to donate blood to be used at a later date on yourself (autologous donation).   Relatives can be asked to donate blood. This is generally not any safer than if you have received blood from a stranger. The same precautions are taken to ensure safety when a relative's blood is donated.   Healthy volunteers who are fully evaluated to make sure their blood is safe. This is blood bank blood.  Transfusion therapy is the safest it has ever been in the practice of medicine. Before blood is taken from a donor, a complete history is taken to make sure that person has no history of diseases nor engages in risky social behavior (examples are intravenous drug use or sexual activity with multiple partners). The donor's travel history is screened to minimize risk of transmitting infections, such as malaria. The donated blood is tested for signs of infectious diseases, such as HIV and hepatitis. The blood is then tested to be sure it is compatible with you in order to minimize the chance of a transfusion reaction. If you or a relative donates blood, this is often done in anticipation of surgery and is not appropriate for emergency situations. It takes many days to process the donated blood.  RISKS AND COMPLICATIONS  Although transfusion therapy is very safe and saves many lives, the main dangers of transfusion include:    Getting an infectious disease.   Developing a  transfusion reaction. This is an allergic reaction to something in the blood you were given. Every precaution is taken to prevent this.  The decision to have a blood transfusion has been considered carefully by your caregiver before blood is given. Blood is not given unless the benefits outweigh the risks.  AFTER THE TRANSFUSION   Right after receiving a blood transfusion, you will usually feel much better and more energetic. This is especially true if your red blood cells have gotten low (anemic). The transfusion raises the level of the red blood cells which carry oxygen, and this usually causes an energy increase.   The nurse administering the transfusion will monitor you carefully for complications.  HOME CARE INSTRUCTIONS   No special instructions are needed after a transfusion. You may find your energy is better. Speak with your caregiver about any limitations on activity for underlying diseases you may have.  SEEK MEDICAL CARE IF:    Your condition is not improving after your transfusion.   You develop redness or irritation at the intravenous (IV) site.  SEEK IMMEDIATE MEDICAL CARE IF:   Any of the following symptoms occur over the next 12 hours:   Shaking chills.   You have a temperature by mouth above 102 F (38.9 C), not controlled by medicine.   Chest, back, or muscle pain.   People around you feel you are not acting correctly or are confused.   Shortness of breath or difficulty breathing.   Dizziness and fainting.     You get a rash or develop hives.   You have a decrease in urine output.   Your urine turns a dark color or changes to pink, red, or brown.  Any of the following symptoms occur over the next 10 days:   You have a temperature by mouth above 102 F (38.9 C), not controlled by medicine.   Shortness of breath.   Weakness after normal activity.   The white part of the eye turns yellow (jaundice).   You have a decrease in the amount of urine or are urinating less often.   Your  urine turns a dark color or changes to pink, red, or brown.  Document Released: 05/15/2000 Document Revised: 05/07/2011 Document Reviewed: 01/02/2008  [employer] Patient Information 2012 [employer].

## 2011-12-05 LAB — TYPE AND SCREEN
ABO/RH(D): O NEG
Antibody Screen: POSITIVE
DAT, IgG: NEGATIVE
Donor AG Type: NEGATIVE
PT AG Type: NEGATIVE
Unit division: 0

## 2011-12-08 ENCOUNTER — Ambulatory Visit: Payer: Medicare Other

## 2011-12-09 ENCOUNTER — Ambulatory Visit (HOSPITAL_BASED_OUTPATIENT_CLINIC_OR_DEPARTMENT_OTHER): Payer: Medicare Other

## 2011-12-09 ENCOUNTER — Other Ambulatory Visit (HOSPITAL_BASED_OUTPATIENT_CLINIC_OR_DEPARTMENT_OTHER): Payer: Medicare Other | Admitting: Lab

## 2011-12-09 ENCOUNTER — Telehealth: Payer: Self-pay | Admitting: *Deleted

## 2011-12-09 ENCOUNTER — Ambulatory Visit (HOSPITAL_BASED_OUTPATIENT_CLINIC_OR_DEPARTMENT_OTHER): Payer: Medicare Other | Admitting: Oncology

## 2011-12-09 ENCOUNTER — Encounter: Payer: Self-pay | Admitting: Oncology

## 2011-12-09 VITALS — BP 130/89 | HR 97 | Temp 98.2°F | Ht 65.0 in | Wt 212.1 lb

## 2011-12-09 DIAGNOSIS — C50319 Malignant neoplasm of lower-inner quadrant of unspecified female breast: Secondary | ICD-10-CM

## 2011-12-09 DIAGNOSIS — D6959 Other secondary thrombocytopenia: Secondary | ICD-10-CM

## 2011-12-09 DIAGNOSIS — Z5112 Encounter for antineoplastic immunotherapy: Secondary | ICD-10-CM

## 2011-12-09 DIAGNOSIS — Z171 Estrogen receptor negative status [ER-]: Secondary | ICD-10-CM

## 2011-12-09 LAB — COMPREHENSIVE METABOLIC PANEL
Alkaline Phosphatase: 78 U/L (ref 39–117)
BUN: 17 mg/dL (ref 6–23)
CO2: 25 mEq/L (ref 19–32)
Creatinine, Ser: 1.09 mg/dL (ref 0.50–1.10)
Glucose, Bld: 101 mg/dL — ABNORMAL HIGH (ref 70–99)
Sodium: 139 mEq/L (ref 135–145)
Total Bilirubin: 0.7 mg/dL (ref 0.3–1.2)

## 2011-12-09 LAB — CBC WITH DIFFERENTIAL/PLATELET
Basophils Absolute: 0 10*3/uL (ref 0.0–0.1)
EOS%: 0.3 % (ref 0.0–7.0)
HGB: 11.8 g/dL (ref 11.6–15.9)
LYMPH%: 20 % (ref 14.0–49.7)
MCH: 29.5 pg (ref 25.1–34.0)
MCV: 86.8 fL (ref 79.5–101.0)
MONO%: 11.2 % (ref 0.0–14.0)
NEUT%: 68.2 % (ref 38.4–76.8)
Platelets: 95 10*3/uL — ABNORMAL LOW (ref 145–400)
RDW: 15.3 % — ABNORMAL HIGH (ref 11.2–14.5)

## 2011-12-09 MED ORDER — SODIUM CHLORIDE 0.9 % IV SOLN
Freq: Once | INTRAVENOUS | Status: AC
  Administered 2011-12-09: 14:00:00 via INTRAVENOUS

## 2011-12-09 MED ORDER — OXYCODONE-ACETAMINOPHEN 5-325 MG PO TABS: 1.0000 | ORAL_TABLET | Freq: Four times a day (QID) | ORAL | Status: DC | PRN

## 2011-12-09 MED ORDER — DIPHENHYDRAMINE HCL 25 MG PO CAPS
50.0000 mg | ORAL_CAPSULE | Freq: Once | ORAL | Status: AC
  Administered 2011-12-09: 50 mg via ORAL

## 2011-12-09 MED ORDER — TRASTUZUMAB CHEMO INJECTION 440 MG
2.0000 mg/kg | Freq: Once | INTRAVENOUS | Status: AC
  Administered 2011-12-09: 189 mg via INTRAVENOUS
  Filled 2011-12-09: qty 9

## 2011-12-09 MED ORDER — SODIUM CHLORIDE 0.9 % IJ SOLN
10.0000 mL | INTRAMUSCULAR | Status: DC | PRN
  Administered 2011-12-09: 10 mL
  Filled 2011-12-09: qty 10

## 2011-12-09 MED ORDER — HEPARIN SOD (PORK) LOCK FLUSH 100 UNIT/ML IV SOLN
500.0000 [IU] | Freq: Once | INTRAVENOUS | Status: AC | PRN
  Administered 2011-12-09: 500 [IU]
  Filled 2011-12-09: qty 5

## 2011-12-09 MED ORDER — ACETAMINOPHEN 325 MG PO TABS
650.0000 mg | ORAL_TABLET | Freq: Once | ORAL | Status: AC
  Administered 2011-12-09: 650 mg via ORAL

## 2011-12-09 NOTE — Progress Notes (Signed)
Pt tolerated treatment well.  Discharged to home with husband.  No complaints

## 2011-12-09 NOTE — Telephone Encounter (Signed)
Per staff message I have scheduled appts. JMW  

## 2011-12-09 NOTE — Patient Instructions (Addendum)
1. We will hold taxotere and cytoxan today as your platelets are low.  2. Proceed with Herceptin  3. Pain medications refill given  4. Return in 1 week for herceptin

## 2011-12-09 NOTE — Patient Instructions (Addendum)
[facility] Discharge Instructions for Patients Receiving Chemotherapy  Today you received the following chemotherapy agents: Herceptin  To help prevent nausea and vomiting after your treatment, we encourage you to take your nausea medication.   Take it as often as prescribed for the next 48-72 hours.   If you develop nausea and vomiting that is not controlled by your nausea medication, call the clinic. If it is after clinic hours your family physician or the after hours number for the clinic or go to the Emergency Department.   BELOW ARE SYMPTOMS THAT SHOULD BE REPORTED IMMEDIATELY:  *FEVER GREATER THAN 100.5 F  *CHILLS WITH OR WITHOUT FEVER  NAUSEA AND VOMITING THAT IS NOT CONTROLLED WITH YOUR NAUSEA MEDICATION  *UNUSUAL SHORTNESS OF BREATH  *UNUSUAL BRUISING OR BLEEDING  TENDERNESS IN MOUTH AND THROAT WITH OR WITHOUT PRESENCE OF ULCERS  *URINARY PROBLEMS  *BOWEL PROBLEMS  UNUSUAL RASH Items with * indicate a potential emergency and should be followed up as soon as possible.  Feel free to call the clinic if you have any questions or concerns. The clinic phone number is [phone].   I have been informed and understand all the instructions given to me. I know to contact the clinic, my physician, or go to the Emergency Department if any problems should occur. I do not have any questions at this time, but understand that I may call the clinic during office hours   should I have any questions or need assistance in obtaining follow up care.    __________________________________________  _____________  __________ Signature of Patient or Authorized Representative            Date                   Time    __________________________________________ Nurse's Signature

## 2011-12-09 NOTE — Progress Notes (Signed)
OFFICE PROGRESS NOTE  CC  Rachel Belling, MD [address] [facility] 4th Floor [place] [address] Dr. Claud Kelp  DIAGNOSIS: 40 year ol female with new diagnosis of stage I ER-/PR-/Her2Neu positive invasive ductal breast cancer  PRIOR THERAPY: 1. 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 ER negative PR negative Her2 Neu positive with Ki -67 53%,   2. S/P porta cath palcement for chemotherapy  #3 patient has begun her adjuvant chemotherapy consisting of Taxotere carboplatinum and Herceptin. Her treatment began in May 2013. A total of 4 cycles of Taxotere carboplatinum and Herceptin combination are planned. Once she completes this she will then proceed to radiation therapy with concomitant Herceptin to be given every 3 weeks to finish out a year of treatment.  CURRENT THERAPY: Today patient is here for Herceptin only  INTERVAL HISTORY: [name] 66 y.[name]. female returns for follow up.Patient continues to be fatigued and I do think that this is due to her Taxotere or and carboplatinum. She has not had any bleeding problems although she is thrombocytopenic. She has no nausea or vomiting. She does have some numbness in her fingertips and she just has generalized aches and pains and she is taking oxycodone for this another prescription was given to her. At this time I do think that she's not tolerating the Taxotere carboplatinum and I will hold this week's dose and we will tack it on at the end of her cycles. Thus her chemotherapy schedule will be adjusted accordingly.Remainder of the review of systems is unremarkable. MEDICAL HISTORY: Past Medical History  Diagnosis Date  . Obesity   . PUD (peptic ulcer disease)   . Leukopenia   . Abdominal pain   . Short bowel syndrome   . Internal hemorrhoids without mention of complication   . Stricture and stenosis of esophagus   . Osteoarthrosis, unspecified  whether generalized or localized, unspecified site   . Thyrotoxicosis without mention of goiter or other cause, without mention of thyrotoxic crisis or storm   . Other and unspecified hyperlipidemia   . Gout, unspecified   . Diverticulosis of colon (without mention of hemorrhage)   . C. difficile colitis   . VITAMIN B12 DEFICIENCY 08/30/2009  . GOITER, MULTINODULAR 04/02/2009  . HYPOTHYROIDISM, POST-RADIATION 08/13/2009  . ASYMPTOMATIC POSTMENOPAUSAL STATUS 10/11/2008  . Esophageal reflux 06/12/2008  . PONV (postoperative nausea and vomiting)   . Varicose veins   . Type II or unspecified type diabetes mellitus without mention of complication, not stated as uncontrolled     no med in years diet controled  . Blood transfusion   . UTI (urinary tract infection)   . Kidney stones     "several"  . Breast cancer 09/29/11    left  . Unspecified essential hypertension   . Angina   . Shortness of breath on exertion     "sometimes"  . Anemia   . ANEMIA, IRON DEFICIENCY 05/08/2009  . History of lower GI bleeding   . H/[name] hiatal hernia   . Migraines     ALLERGIES:  is allergic to aspirin; iodine; and morphine and related.  MEDICATIONS:  Current Outpatient Prescriptions  Medication Sig Dispense Refill  . Calcium Carbonate-Vitamin D (CALCIUM-VITAMIN D) 600-200 MG-UNIT CAPS Take 1 capsule by mouth daily.        . cyanocobalamin (,VITAMIN B-12,) 1000 MCG/ML injection Inject 1,000 mcg into the muscle every 30 (thirty) days. Next  one due the 26th of this month      . cyclobenzaprine (FLEXERIL) 5 MG tablet Take 5 mg by mouth 3 (three) times daily as needed.      Marland Kitchen dexamethasone (DECADRON) 4 MG tablet TAKE 2 TABLETS TWICE DAILY DAY PRIOR TO TREATMENT, DAY OF AND DAY AFTER  30 tablet  2  . levothyroxine (SYNTHROID, LEVOTHROID) 125 MCG tablet Take 125 mcg by mouth daily.      Marland Kitchen lidocaine-prilocaine (EMLA) cream Apply topically as needed.  30 g  0  . LORazepam (ATIVAN) 0.5 MG tablet Take 1 tablet (0.5 mg  total) by mouth every 6 (six) hours as needed (Nausea or vomiting).  30 tablet  0  . lovastatin (MEVACOR) 20 MG tablet Take 20 mg by mouth at bedtime.      . magnesium chloride (SLOW-MAG) 64 MG TBEC Take 1 tablet by mouth daily.       . nitroGLYCERIN (NITROSTAT) 0.4 MG SL tablet Place 0.4 mg under the tongue every 5 (five) minutes as needed. For chest pain      . omeprazole (PRILOSEC) 40 MG capsule Take 1 capsule (40 mg total) by mouth daily.  30 capsule  6  . ondansetron (ZOFRAN) 8 MG tablet Take 1 tablet two times a day starting the day after chemo for 3 days. Then take 1 tab two times a day as needed for nausea or vomiting.  30 tablet  1  . oxyCODONE-acetaminophen (PERCOCET) 5-325 MG per tablet Take 1 tablet by mouth every 6 (six) hours as needed. For pain  60 tablet  0  . potassium chloride SA (K-DUR,KLOR-CON) 20 MEQ tablet Take 1 tablet (20 mEq total) by mouth daily.  6 tablet  0  . prochlorperazine (COMPAZINE) 10 MG tablet Take 10 mg by mouth every 6 (six) hours as needed.      . prochlorperazine (COMPAZINE) 10 MG tablet Take 1 tablet (10 mg total) by mouth every 6 (six) hours as needed (Nausea or vomiting).  30 tablet  1  . prochlorperazine (COMPAZINE) 10 MG tablet TAKE 1 TABLET (10 MG TOTAL) BY MOUTH EVERY 6 (SIX) HOURS AS NEEDED.  30 tablet  2  . prochlorperazine (COMPAZINE) 25 MG suppository Place 25 mg rectally every 12 (twelve) hours as needed.      . prochlorperazine (COMPAZINE) 25 MG suppository Place 1 suppository (25 mg total) rectally every 12 (twelve) hours as needed for nausea.  12 suppository  3  . promethazine (PHENERGAN) 25 MG tablet Take 25 mg by mouth every 8 (eight) hours as needed. For nausea      . thiamine 100 MG tablet Take 100 mg by mouth daily.        . trazodone (DESYREL) 300 MG tablet Take 300 mg by mouth at bedtime.        SURGICAL HISTORY:  Past Surgical History  Procedure Date  . Vein ligation and stripping 1980's    Right leg  . Lithotripsy     "4 or 5  times"  . Bunionectomy 1970's    bilateral  . Colonoscopy   . Esophagogastroduodenoscopy 07/15/2005  . Thyroid ultrasound 12/1994 and 12/1995  . Mastectomy w/ nodes partial 09/29/11    left  . Port a cath placement 09/29/11    right chest  . Breast surgery   . Cholecystectomy 1990's  . Abdominal hysterectomy 1970's    with BSO  . Dilation and curettage of uterus   . Colon surgery     "several surgeries  for short bowel syndrome"  . Abdominal adhesion surgery 1980's thru 1990's    "several"  . Kidney stone surgery 1990's    "tried to go up & get it but pushed it further up"  . Portacath placement 09/29/2011    Procedure: INSERTION PORT-A-CATH;  Surgeon: Ernestene Mention, MD;  Location: [facility] OR;  Service: General;  Laterality: N/A;    REVIEW OF SYSTEMS:  Pertinent items are noted in HPI.   PHYSICAL EXAMINATION: General appearance: alert, cooperative and appears stated age Resp: clear to auscultation bilaterally and normal percussion bilaterally Cardio: regular rate and rhythm, S1, S2 normal, no murmur, click, rub or gallop GI: soft, non-tender; bowel sounds normal; no masses,  no organomegaly Extremities: extremities normal, atraumatic, no cyanosis or edema Neurologic: Grossly normal Bilateral Breast Exam: left breast healing incision scar, with some tenderness, no evidence of infections.Right breast no masses or nipple discharge ECOG PERFORMANCE STATUS: 1 - Symptomatic but completely ambulatory  Blood pressure 130/89, pulse 97, temperature 98.2 F (36.8 C), temperature source Oral, height 5\' 5"  (1.651 m), weight 212 lb 1.6 oz (96.208 kg).  LABORATORY DATA: Lab Results  Component Value Date   WBC 9.5 12/09/2011   HGB 11.8 12/09/2011   HCT 34.7* 12/09/2011   MCV 86.8 12/09/2011   PLT 95* 12/09/2011      Chemistry      Component Value Date/Time   NA 137 12/02/2011 0806   K 3.3* 12/02/2011 0806   CL 102 12/02/2011 0806   CO2 25 12/02/2011 0806   BUN 16 12/02/2011 0806   CREATININE 1.05  12/02/2011 0806   CREATININE 0.88 03/31/2011 1550      Component Value Date/Time   CALCIUM 7.8* 12/02/2011 0806   CALCIUM 9.5 05/15/2010 2153   ALKPHOS 91 12/02/2011 0806   AST 29 12/02/2011 0806   ALT 15 12/02/2011 0806   BILITOT 0.4 12/02/2011 0806     ADDITIONAL INFORMATION: 1. PROGNOSTIC INDICATORS - ACIS Results IMMUNOHISTOCHEMICAL AND MORPHOMETRIC ANALYSIS BY THE AUTOMATED CELLULAR IMAGING SYSTEM (ACIS) Estrogen Receptor (Negative, <1%): 0%, NEGATIVE Progesterone Receptor (Negative, <1%): 0%, NEGATIVE COMMENT: The negative hormone receptor study(ies) in this case have an internal positive control. All controls stained appropriately Abigail Miyamoto MD Pathologist, Electronic Signature ( Signed 10/07/2011) FINAL DIAGNOSIS 1 of 4 FINAL for Sciascia, [name] ([id number]) Diagnosis 1. Breast, lumpectomy, Left - INVASIVE GRADE III, DUCTAL CARCINOMA, SPANNING 0.7 CM. - ASSOCIATED HIGH GRADED DUCTAL CARCINOMA IN SITU. - LYMPH/VASCULAR INVASION NOT IDENTIFIED. - MARGINS ARE NEGATIVE. - SEE ONCOLOGY TEMPLATE. 2. Lymph node, sentinel, biopsy, Left axillary#1 - ONE BENIGN LYMPH NODE WITH NO TUMOR (0/1). - BENIGN GLANDULAR EPITHELIAL INCLUSIONS PRESENT. - SEE COMMENT. 3. Breast, excision, Posterior margin - BENIGN BREAST PARENCHYMA. - NO ATYPIA, HYPERPLASIA, OR MALIGNANCY IDENTIFIED. 4. Lymph node, sentinel, biopsy, Left axillary #2 - ONE BENIGN LYMPH NODE WITH NO TUMOR SEEN (0/1). Microscopic Comment 1. BREAST, INVASIVE TUMOR, WITH LYMPH NODE SAMPLING Specimen, including laterality: Left breast with posterior margin and sentinel lymph nodes. Procedure: Left breast lumpectomy with posterior margin excision and sentinel lymph node biopsies. Grade: III. Tubule formation: 3. Nuclear pleomorphism: 3. Mitotic:3. Tumor size (gross measurement and glass slide measurement): 0.7 cm. Margins: Invasive, distance to closest margin: At least 0.8 cm. In-situ, distance to closest margin: At  least 0.8 cm. Lymphovascular invasion: Not identified. Ductal carcinoma in situ: Yes. Grade: High grade. Extensive intraductal component: No. Lobular neoplasia: No. Tumor focality: Unifocal. Treatment effect: N/A. Extent of tumor: Confined to breast parenchyma. Lymph  nodes: # examined: 2. Lymph nodes with metastasis: 0. Breast prognostic profile: Performed on previous case ([id number]) Estrogen receptor: 0%, negative. Progesterone receptor: 0%, negative. Her 2 neu: 3.09, amplified. Ki-67: 53%. Non-neoplastic breast: Fat necrosis present. TNM: pT1b, pN0, MX. Comments: An estrogen receptor and progesterone receptor will be repeated on the current tumor and reported in an addendum. (RAH:gt, 10/01/11) 2. The left sentinel axillary lymph node #1 shows benign glandular epithelial inclusions. Some of the inclusions are ciliated. The nuclei are bland appearing and are not malignant. Smooth muscle myosin, p63 and calponin immunohistochemical stains are performed which do not show a myoepithelial layer. 2 of 4 FINAL for Deats, [name] ([id number]) Microscopic Comment(continued) Although this is the case, the inclusions are benign and do not represent metastatic carcinoma. Both Dr. Frederica Kuster and Dr. Colonel Bald have seen the left sentinel axillary lymph node in consultation with agreement that the inclusions are benign and do not represent metastatic carcinoma. Zandra Abts MD Pathologist, Electronic Signature (Case signed 10/02/2011) Specimen Gross and Clinical Information Specimen(s) Obtained: 1. Breast, lumpectomy, Left 2. Lymph node, sentinel, biopsy, Left axillary#1 3. Breast, excision, Posterior margin 4. Lymph node, sentinel, biopsy, Left axillary #2 Specimen Clinical  RADIOGRAPHIC STUDIES:  ASSESSMENT: 66 year old with   1. 0.7 cm high grade invasive ductal carcinoma that is ER-, PR- Her2Neu +, sentinel node negative (T1bN0) pathologic stage I.Because patient is HER-2 positive  she is receiving adjuvant chemotherapy and Herceptin. She is going to be receiving 4 cycles of TCH combination and then Herceptin alone to complete out 1years worth of treatment.  #2 nausea and vomiting secondary most likely due to her cycle #2 of chemotherapy.  #3 patient does have symptomatic anemia with a hemoglobin of 8.8 she is short of breath on exertion and even has been having difficulty doing some housework.She is status post 2 units of packed red cells.  #3 thrombocytopenia do to her chemotherapy. PLAN:  #1 patient will proceed with her Herceptin only today.  #2 Patient received blood transfusion last week she feels a little bit better and her hemoglobin is up to 11.8. However her platelets are low at 95,000 we will hold the Taxotere and Cytoxan today.We will adjust her chemotherapy schedule accordingly.  #3 she will return in one week's time for Herceptin only.  All questions were answered. The patient knows to call the clinic with any problems, questions or concerns. We can certainly see the patient much sooner if necessary.  I spent 25 minutes counseling the patient face to face. The total time spent in the appointment was 30 minutes.    Drue Second, MD Medical/Oncology [facility] [phone] (beeper) [phone] (Office)  12/09/2011, 1:10 PM

## 2011-12-10 ENCOUNTER — Telehealth: Payer: Self-pay | Admitting: *Deleted

## 2011-12-10 ENCOUNTER — Encounter (INDEPENDENT_AMBULATORY_CARE_PROVIDER_SITE_OTHER): Payer: Medicare Other | Admitting: General Surgery

## 2011-12-10 ENCOUNTER — Ambulatory Visit: Payer: Medicare Other

## 2011-12-10 MED ORDER — POTASSIUM CHLORIDE CRYS ER 20 MEQ PO TBCR: 20.0000 meq | EXTENDED_RELEASE_TABLET | Freq: Every day | ORAL | Status: DC

## 2011-12-10 NOTE — Telephone Encounter (Signed)
Message copied by Cooper Render on Thu Dec 10, 2011 10:29 AM ------      Message from: Victorino December      Created: Thu Dec 10, 2011  7:05 AM       Call patient: begin potassium (k-dur) 20 meq po daily x 5 days

## 2011-12-10 NOTE — Telephone Encounter (Signed)
Per MD, Notified Pt to start K-dur -1 PO Daily x 5days. Rx sent to pt's pharmacy. Pt verbalized understanding.

## 2011-12-15 ENCOUNTER — Ambulatory Visit: Payer: Medicare Other

## 2011-12-16 ENCOUNTER — Ambulatory Visit (HOSPITAL_BASED_OUTPATIENT_CLINIC_OR_DEPARTMENT_OTHER): Payer: Medicare Other

## 2011-12-16 VITALS — BP 119/86 | HR 95 | Temp 97.5°F

## 2011-12-16 DIAGNOSIS — C50319 Malignant neoplasm of lower-inner quadrant of unspecified female breast: Secondary | ICD-10-CM

## 2011-12-16 DIAGNOSIS — Z5112 Encounter for antineoplastic immunotherapy: Secondary | ICD-10-CM

## 2011-12-16 MED ORDER — HEPARIN SOD (PORK) LOCK FLUSH 100 UNIT/ML IV SOLN
500.0000 [IU] | Freq: Once | INTRAVENOUS | Status: AC | PRN
  Administered 2011-12-16: 500 [IU]
  Filled 2011-12-16: qty 5

## 2011-12-16 MED ORDER — TRASTUZUMAB CHEMO INJECTION 440 MG
2.0000 mg/kg | Freq: Once | INTRAVENOUS | Status: AC
  Administered 2011-12-16: 189 mg via INTRAVENOUS
  Filled 2011-12-16: qty 9

## 2011-12-16 MED ORDER — SODIUM CHLORIDE 0.9 % IV SOLN
Freq: Once | INTRAVENOUS | Status: AC
  Administered 2011-12-16: 12:00:00 via INTRAVENOUS

## 2011-12-16 MED ORDER — DIPHENHYDRAMINE HCL 25 MG PO CAPS
50.0000 mg | ORAL_CAPSULE | Freq: Once | ORAL | Status: AC
  Administered 2011-12-16: 50 mg via ORAL

## 2011-12-16 MED ORDER — ACETAMINOPHEN 325 MG PO TABS
650.0000 mg | ORAL_TABLET | Freq: Once | ORAL | Status: AC
  Administered 2011-12-16: 650 mg via ORAL

## 2011-12-16 MED ORDER — SODIUM CHLORIDE 0.9 % IJ SOLN
10.0000 mL | INTRAMUSCULAR | Status: DC | PRN
  Administered 2011-12-16: 10 mL
  Filled 2011-12-16: qty 10

## 2011-12-16 NOTE — Patient Instructions (Addendum)
[facility] Discharge Instructions for Patients Receiving Chemotherapy  Today you received the following chemotherapy agents; Herceptin. Herceptin alone does not usually cause nausea , but if you have any nausea and/or vomiting take your nausea medication;  Ativan (lorazepam), Zofran (ondansetron) and Compazine (prochloraperzine) as directed.    If you develop nausea and vomiting that is not controlled by your nausea medication, call the clinic. If it is after clinic hours your family physician or the after hours number for the clinic or go to the Emergency Department.   BELOW ARE SYMPTOMS THAT SHOULD BE REPORTED IMMEDIATELY:  *FEVER GREATER THAN 100.5 F  *CHILLS WITH OR WITHOUT FEVER  NAUSEA AND VOMITING THAT IS NOT CONTROLLED WITH YOUR NAUSEA MEDICATION  *UNUSUAL SHORTNESS OF BREATH  *UNUSUAL BRUISING OR BLEEDING  TENDERNESS IN MOUTH AND THROAT WITH OR WITHOUT PRESENCE OF ULCERS  *URINARY PROBLEMS  *BOWEL PROBLEMS  UNUSUAL RASH Items with * indicate a potential emergency and should be followed up as soon as possible.  Feel free to call the clinic you have any questions or concerns. The clinic phone number is [phone].   I have been informed and understand all the instructions given to me. I know to contact the clinic, my physician, or go to the Emergency Department if any problems should occur. I do not have any questions at this time, but understand that I may call the clinic during office hours   should I have any questions or need assistance in obtaining follow up care.    __________________________________________  _____________  __________ Signature of Patient or Authorized Representative            Date                   Time    __________________________________________ Nurse's Signature

## 2011-12-21 ENCOUNTER — Encounter (INDEPENDENT_AMBULATORY_CARE_PROVIDER_SITE_OTHER): Payer: Self-pay | Admitting: General Surgery

## 2011-12-21 ENCOUNTER — Ambulatory Visit (INDEPENDENT_AMBULATORY_CARE_PROVIDER_SITE_OTHER): Payer: Medicare Other | Admitting: General Surgery

## 2011-12-21 VITALS — BP 134/94 | HR 97 | Temp 97.6°F | Ht 65.0 in | Wt 215.8 lb

## 2011-12-21 DIAGNOSIS — C50319 Malignant neoplasm of lower-inner quadrant of unspecified female breast: Secondary | ICD-10-CM

## 2011-12-21 NOTE — Patient Instructions (Signed)
Your left breast incision, and left axillary incision, and Port-A-Cath have all healed well without any obvious complication. Your breast exam reveals no abnormality. The scars have healed well.  We will remove the Port-A-Cath next year after you complete all of your chemotherapy.  You'll be scheduled for bilateral diagnostic mammograms in March of 2014.  Return to see Dr. [name] in April 2014, sooner if there are any problems.

## 2011-12-21 NOTE — Progress Notes (Signed)
Subjective:     Patient ID: [name], female   DOB: 1946-05-29, 66 y.o.   MRN: [id number]  HPI This woman underwent left partial mastectomy, sentinel node biopsy, and Port-A-Cath insertion on 09/29/2011. She had invasive ductal carcinoma, 7 mm, receptor-negative, HER-2 positive, Ki-67 53%, BRCA negative. Final pathologic stage TI B., N0.  She is receiving adjuvant chemotherapy, including Herceptin under the guidance of Dr. Drue Second. She's doing fairly well with that except for fatigue. No GI symptoms. The Port-A-Cath is working well.  She has no left arm symptoms. No sensory deficit or significant swelling. Normal range of motion.  She knows that she will need radiation therapy after chemotherapy is completed.  Review of Systems     Objective:   Physical Exam Patient looks well. In no distress. Husband is with her.  Neck reveals no mass. Por catheter palpable right internal jugular vein area.  Lungs clear to auscultation bilaterally. Port palpable right infraclavicular area. Looks fine.  Heart regular rate and rhythm. No murmur  Breasts:  large, pendulous. Well-healed scar medial left breast. Well-healed axillary scar left side. Otherwise no skin changes, no palpable mass, no axillary adenopathy.  Extremities:  left arm and shoulder range of motion is 100%. No sensory deficit. No swelling noted.    Assessment:     Invasive duct carcinoma left breast, pathologic stage TI B., N0. Receptor negative, HER-2 positive. BRCA negative.  No apparent surgical problems following left partial mastectomy, sentinel node biopsy, and Port-A-Cath insertion 09/29/2011.    Plan:     Continued adjuvant chemotherapy with Dr. Drue Second.  Ultimately, she will need adjuvant radiation therapy.  Since she is being examined frequently by Dr. Welton Flakes, I will see her back in April of 2014 after she gets her annual diagnostic mammograms.   [name]. [name], M.D., [facility] General and Minimally invasive Surgery Breast and Colorectal Surgery Office:   [phone] Pager:   [phone]

## 2011-12-23 ENCOUNTER — Ambulatory Visit: Payer: Medicare Other

## 2011-12-23 ENCOUNTER — Other Ambulatory Visit: Payer: Self-pay | Admitting: Oncology

## 2011-12-23 ENCOUNTER — Encounter: Payer: Self-pay | Admitting: Oncology

## 2011-12-23 ENCOUNTER — Other Ambulatory Visit: Payer: Self-pay | Admitting: Medical Oncology

## 2011-12-23 ENCOUNTER — Other Ambulatory Visit (HOSPITAL_BASED_OUTPATIENT_CLINIC_OR_DEPARTMENT_OTHER): Payer: Medicare Other | Admitting: Lab

## 2011-12-23 ENCOUNTER — Ambulatory Visit (HOSPITAL_BASED_OUTPATIENT_CLINIC_OR_DEPARTMENT_OTHER): Payer: Medicare Other | Admitting: Oncology

## 2011-12-23 ENCOUNTER — Ambulatory Visit (HOSPITAL_BASED_OUTPATIENT_CLINIC_OR_DEPARTMENT_OTHER): Payer: Medicare Other

## 2011-12-23 VITALS — BP 137/84 | HR 94 | Temp 98.1°F | Ht 65.0 in | Wt 216.2 lb

## 2011-12-23 DIAGNOSIS — C50319 Malignant neoplasm of lower-inner quadrant of unspecified female breast: Secondary | ICD-10-CM

## 2011-12-23 DIAGNOSIS — R110 Nausea: Secondary | ICD-10-CM

## 2011-12-23 DIAGNOSIS — Z5111 Encounter for antineoplastic chemotherapy: Secondary | ICD-10-CM

## 2011-12-23 DIAGNOSIS — Z5112 Encounter for antineoplastic immunotherapy: Secondary | ICD-10-CM

## 2011-12-23 DIAGNOSIS — Z170 Estrogen receptor positive status [ER+]: Secondary | ICD-10-CM

## 2011-12-23 DIAGNOSIS — T451X5A Adverse effect of antineoplastic and immunosuppressive drugs, initial encounter: Secondary | ICD-10-CM

## 2011-12-23 LAB — COMPREHENSIVE METABOLIC PANEL
ALT: 21 U/L (ref 0–35)
AST: 30 U/L (ref 0–37)
Chloride: 107 mEq/L (ref 96–112)
Creatinine, Ser: 0.98 mg/dL (ref 0.50–1.10)
Sodium: 142 mEq/L (ref 135–145)
Total Bilirubin: 0.9 mg/dL (ref 0.3–1.2)
Total Protein: 6.3 g/dL (ref 6.0–8.3)

## 2011-12-23 LAB — CBC WITH DIFFERENTIAL/PLATELET
BASO%: 0.2 % (ref 0.0–2.0)
EOS%: 0.8 % (ref 0.0–7.0)
HCT: 26.7 % — ABNORMAL LOW (ref 34.8–46.6)
LYMPH%: 18.2 % (ref 14.0–49.7)
MCH: 29.2 pg (ref 25.1–34.0)
MCHC: 33.3 g/dL (ref 31.5–36.0)
NEUT%: 77.2 % — ABNORMAL HIGH (ref 38.4–76.8)
RBC: 3.05 10*6/uL — ABNORMAL LOW (ref 3.70–5.45)
WBC: 5 10*3/uL (ref 3.9–10.3)
lymph#: 0.9 10*3/uL (ref 0.9–3.3)

## 2011-12-23 MED ORDER — SODIUM CHLORIDE 0.9 % IV SOLN
360.0000 mg | Freq: Once | INTRAVENOUS | Status: AC
  Administered 2011-12-23: 360 mg via INTRAVENOUS
  Filled 2011-12-23: qty 36

## 2011-12-23 MED ORDER — SODIUM CHLORIDE 0.9 % IV SOLN
Freq: Once | INTRAVENOUS | Status: AC
  Administered 2011-12-23: 12:00:00 via INTRAVENOUS

## 2011-12-23 MED ORDER — HEPARIN SOD (PORK) LOCK FLUSH 100 UNIT/ML IV SOLN
500.0000 [IU] | Freq: Once | INTRAVENOUS | Status: AC | PRN
  Administered 2011-12-23: 500 [IU]
  Filled 2011-12-23: qty 5

## 2011-12-23 MED ORDER — TRASTUZUMAB CHEMO INJECTION 440 MG
2.0000 mg/kg | Freq: Once | INTRAVENOUS | Status: AC
  Administered 2011-12-23: 189 mg via INTRAVENOUS
  Filled 2011-12-23: qty 9

## 2011-12-23 MED ORDER — LORAZEPAM 0.5 MG PO TABS: 0.5000 mg | ORAL_TABLET | Freq: Four times a day (QID) | ORAL | Status: DC | PRN

## 2011-12-23 MED ORDER — DIPHENHYDRAMINE HCL 25 MG PO CAPS
50.0000 mg | ORAL_CAPSULE | Freq: Once | ORAL | Status: AC
  Administered 2011-12-23: 50 mg via ORAL

## 2011-12-23 MED ORDER — OXYCODONE-ACETAMINOPHEN 5-325 MG PO TABS: 1.0000 | ORAL_TABLET | Freq: Four times a day (QID) | ORAL | Status: DC | PRN

## 2011-12-23 MED ORDER — DOCETAXEL CHEMO INJECTION 160 MG/16ML
50.0000 mg/m2 | Freq: Once | INTRAVENOUS | Status: AC
  Administered 2011-12-23: 110 mg via INTRAVENOUS
  Filled 2011-12-23: qty 11

## 2011-12-23 MED ORDER — ONDANSETRON 16 MG/50ML IVPB (CHCC)
16.0000 mg | Freq: Once | INTRAVENOUS | Status: AC
  Administered 2011-12-23: 16 mg via INTRAVENOUS

## 2011-12-23 MED ORDER — DEXAMETHASONE SODIUM PHOSPHATE 4 MG/ML IJ SOLN
20.0000 mg | Freq: Once | INTRAMUSCULAR | Status: AC
  Administered 2011-12-23: 20 mg via INTRAVENOUS

## 2011-12-23 MED ORDER — PROMETHAZINE HCL 25 MG PO TABS: 25.0000 mg | ORAL_TABLET | Freq: Four times a day (QID) | ORAL | Status: DC | PRN

## 2011-12-23 MED ORDER — ACETAMINOPHEN 325 MG PO TABS
650.0000 mg | ORAL_TABLET | Freq: Once | ORAL | Status: AC
  Administered 2011-12-23: 650 mg via ORAL

## 2011-12-23 MED ORDER — GABAPENTIN 100 MG PO CAPS: 100.0000 mg | ORAL_CAPSULE | Freq: Two times a day (BID) | ORAL | Status: DC

## 2011-12-23 MED ORDER — SODIUM CHLORIDE 0.9 % IJ SOLN
10.0000 mL | INTRAMUSCULAR | Status: DC | PRN
  Administered 2011-12-23: 10 mL
  Filled 2011-12-23: qty 10

## 2011-12-23 MED ORDER — PROMETHAZINE HCL 25 MG PO TABS: 25.0000 mg | ORAL_TABLET | Freq: Three times a day (TID) | ORAL | Status: DC | PRN

## 2011-12-23 NOTE — Patient Instructions (Signed)
[facility] Discharge Instructions for Patients Receiving Chemotherapy  Today you received the following chemotherapy agents Herceptin, Taxotere, and Carboplatin  To help prevent nausea and vomiting after your treatment, we encourage you to take your nausea medication as prescribed.   If you develop nausea and vomiting that is not controlled by your nausea medication, call the clinic. If it is after clinic hours your family physician or the after hours number for the clinic or go to the Emergency Department.   BELOW ARE SYMPTOMS THAT SHOULD BE REPORTED IMMEDIATELY:  *FEVER GREATER THAN 100.5 F  *CHILLS WITH OR WITHOUT FEVER  NAUSEA AND VOMITING THAT IS NOT CONTROLLED WITH YOUR NAUSEA MEDICATION  *UNUSUAL SHORTNESS OF BREATH  *UNUSUAL BRUISING OR BLEEDING  TENDERNESS IN MOUTH AND THROAT WITH OR WITHOUT PRESENCE OF ULCERS  *URINARY PROBLEMS  *BOWEL PROBLEMS  UNUSUAL RASH Items with * indicate a potential emergency and should be followed up as soon as possible.  One of the nurses will contact you 24 hours after your treatment. Please let the nurse know about any problems that you may have experienced. Feel free to call the clinic you have any questions or concerns. The clinic phone number is [phone].   I have been informed and understand all the instructions given to me. I know to contact the clinic, my physician, or go to the Emergency Department if any problems should occur. I do not have any questions at this time, but understand that I may call the clinic during office hours   should I have any questions or need assistance in obtaining follow up care.    __________________________________________  _____________  __________ Signature of Patient or Authorized Representative            Date                   Time    __________________________________________ Nurse's Signature

## 2011-12-23 NOTE — Patient Instructions (Addendum)
1. Proceed with chemotherapy and Herceptin today  2. Return in 1 week for herceptin and MD visit  3. Neurontin 100 mg twice a day for tingling and numbness of the hands and feet due to chemotherapy

## 2011-12-23 NOTE — Progress Notes (Signed)
OFFICE PROGRESS NOTE  CC  [name], MD [address] [facility] 4th Floor [place] [address] Dr. Claud Kelp  DIAGNOSIS: 19 year ol female with new diagnosis of stage I ER-/PR-/Her2Neu positive invasive ductal breast cancer  PRIOR THERAPY: 1. 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 ER negative PR negative Her2 Neu positive with Ki -67 53%,   2. S/P porta cath palcement for chemotherapy  #3 patient has begun her adjuvant chemotherapy consisting of Taxotere carboplatinum and Herceptin. Her treatment began in May 2013. A total of 4 cycles of Taxotere carboplatinum and Herceptin combination are planned. Once she completes this she will then proceed to radiation therapy with concomitant Herceptin to be given every 3 weeks to finish out a year of treatment.  CURRENT THERAPY: Today patient is here for Taxotere carboplatinum and Herceptin cycle 3 with date 2 Neulasta on 12/24/2011  INTERVAL HISTORY: [name] 66 y.[name]. female returns for follow up.Today [name] like she is feeling much better and she is ready to get her chemotherapy as well as the Herceptin. She still however continues to experience some nausea she feels that Phenergan helps her more so than the Zofran or the Compazine and a prescription for Phenergan was given to her. She denies any headaches currently but she did have a little bit of a headache over the weekend. She denies any difficulty in breathing no shortness of breath no chest pains. She has a little bit of fatigue. She has no myalgias or arthralgias but she is complaining of tingling and numbness in her hands and feet but they are not interfering with ambulation or would doing work around the house. But she is concerned that it is slightly worse than it had been in the past. She denies any easy bruising or bleeding. She has no chest pains. No bone no pain occasional  constipation is noted. And remainder of the 10 point review of systems is negative.  MEDICAL HISTORY: Past Medical History  Diagnosis Date  . Obesity   . PUD (peptic ulcer disease)   . Leukopenia   . Abdominal pain   . Short bowel syndrome   . Internal hemorrhoids without mention of complication   . Stricture and stenosis of esophagus   . Osteoarthrosis, unspecified whether generalized or localized, unspecified site   . Thyrotoxicosis without mention of goiter or other cause, without mention of thyrotoxic crisis or storm   . Other and unspecified hyperlipidemia   . Gout, unspecified   . Diverticulosis of colon (without mention of hemorrhage)   . C. difficile colitis   . VITAMIN B12 DEFICIENCY 08/30/2009  . GOITER, MULTINODULAR 04/02/2009  . HYPOTHYROIDISM, POST-RADIATION 08/13/2009  . ASYMPTOMATIC POSTMENOPAUSAL STATUS 10/11/2008  . Esophageal reflux 06/12/2008  . PONV (postoperative nausea and vomiting)   . Varicose veins   . Type II or unspecified type diabetes mellitus without mention of complication, not stated as uncontrolled     no med in years diet controled  . Blood transfusion   . UTI (urinary tract infection)   . Kidney stones     "several"  . Breast cancer 09/29/11    left  . Unspecified essential hypertension   . Angina   . Shortness of breath on exertion     "sometimes"  . Anemia   . ANEMIA, IRON DEFICIENCY 05/08/2009  . History of lower GI bleeding   . H/[name] hiatal  hernia   . Migraines     ALLERGIES:  is allergic to aspirin; iodine; and morphine and related.  MEDICATIONS:  Current Outpatient Prescriptions  Medication Sig Dispense Refill  . Calcium Carbonate-Vitamin D (CALCIUM-VITAMIN D) 600-200 MG-UNIT CAPS Take 1 capsule by mouth daily.        . cyanocobalamin (,VITAMIN B-12,) 1000 MCG/ML injection Inject 1,000 mcg into the muscle every 30 (thirty) days. Next one due the 26th of this month      . cyclobenzaprine (FLEXERIL) 5 MG tablet Take 5 mg by mouth 3  (three) times daily as needed.      Marland Kitchen dexamethasone (DECADRON) 4 MG tablet TAKE 2 TABLETS TWICE DAILY DAY PRIOR TO TREATMENT, DAY OF AND DAY AFTER  30 tablet  2  . levothyroxine (SYNTHROID, LEVOTHROID) 125 MCG tablet Take 125 mcg by mouth daily.      Marland Kitchen lidocaine-prilocaine (EMLA) cream Apply topically as needed.  30 g  0  . lovastatin (MEVACOR) 20 MG tablet Take 20 mg by mouth at bedtime.      . magnesium chloride (SLOW-MAG) 64 MG TBEC Take 1 tablet by mouth daily.       . nitroGLYCERIN (NITROSTAT) 0.4 MG SL tablet Place 0.4 mg under the tongue every 5 (five) minutes as needed. For chest pain      . omeprazole (PRILOSEC) 40 MG capsule Take 1 capsule (40 mg total) by mouth daily.  30 capsule  6  . ondansetron (ZOFRAN) 8 MG tablet Take 1 tablet two times a day starting the day after chemo for 3 days. Then take 1 tab two times a day as needed for nausea or vomiting.  30 tablet  1  . potassium chloride SA (K-DUR,KLOR-CON) 20 MEQ tablet Take 1 tablet (20 mEq total) by mouth daily.  5 tablet  0  . prochlorperazine (COMPAZINE) 10 MG tablet Take 10 mg by mouth every 6 (six) hours as needed.      . prochlorperazine (COMPAZINE) 10 MG tablet Take 1 tablet (10 mg total) by mouth every 6 (six) hours as needed (Nausea or vomiting).  30 tablet  1  . prochlorperazine (COMPAZINE) 10 MG tablet TAKE 1 TABLET (10 MG TOTAL) BY MOUTH EVERY 6 (SIX) HOURS AS NEEDED.  30 tablet  2  . prochlorperazine (COMPAZINE) 25 MG suppository Place 25 mg rectally every 12 (twelve) hours as needed.      . prochlorperazine (COMPAZINE) 25 MG suppository Place 1 suppository (25 mg total) rectally every 12 (twelve) hours as needed for nausea.  12 suppository  3  . thiamine 100 MG tablet Take 100 mg by mouth daily.        . trazodone (DESYREL) 300 MG tablet Take 300 mg by mouth at bedtime.      Marland Kitchen LORazepam (ATIVAN) 0.5 MG tablet Take 1 tablet (0.5 mg total) by mouth every 6 (six) hours as needed (Nausea or vomiting).  30 tablet  0  .  oxyCODONE-acetaminophen (PERCOCET/ROXICET) 5-325 MG per tablet Take 1 tablet by mouth every 6 (six) hours as needed. For pain  60 tablet  0  . promethazine (PHENERGAN) 25 MG tablet Take 1 tablet (25 mg total) by mouth every 8 (eight) hours as needed. For nausea  30 tablet  0  . promethazine (PHENERGAN) 25 MG tablet Take 1 tablet (25 mg total) by mouth every 6 (six) hours as needed for nausea.  30 tablet  4    SURGICAL HISTORY:  Past Surgical History  Procedure Date  .  Vein ligation and stripping 1980's    Right leg  . Lithotripsy     "4 or 5 times"  . Bunionectomy 1970's    bilateral  . Colonoscopy   . Esophagogastroduodenoscopy 07/15/2005  . Thyroid ultrasound 12/1994 and 12/1995  . Mastectomy w/ nodes partial 09/29/11    left  . Port a cath placement 09/29/11    right chest  . Breast surgery   . Cholecystectomy 1990's  . Abdominal hysterectomy 1970's    with BSO  . Dilation and curettage of uterus   . Colon surgery     "several surgeries for short bowel syndrome"  . Abdominal adhesion surgery 1980's thru 1990's    "several"  . Kidney stone surgery 1990's    "tried to go up & get it but pushed it further up"  . Portacath placement 09/29/2011    Procedure: INSERTION PORT-A-CATH;  Surgeon: Ernestene Mention, MD;  Location: [facility] OR;  Service: General;  Laterality: N/A;    REVIEW OF SYSTEMS:  Pertinent items are noted in HPI.   PHYSICAL EXAMINATION: General appearance: alert, cooperative and appears stated age Resp: clear to auscultation bilaterally and normal percussion bilaterally Cardio: regular rate and rhythm, S1, S2 normal, no murmur, click, rub or gallop GI: soft, non-tender; bowel sounds normal; no masses,  no organomegaly Extremities: extremities normal, atraumatic, no cyanosis or edema Neurologic: Grossly normal Bilateral Breast Exam: left breast healing incision scar, with some tenderness, no evidence of infections.Right breast no masses or nipple discharge ECOG  PERFORMANCE STATUS: 1 - Symptomatic but completely ambulatory  Blood pressure 137/84, pulse 94, temperature 98.1 F (36.7 C), temperature source Oral, height 5\' 5"  (1.651 m), weight 216 lb 3.2 oz (98.068 kg).  LABORATORY DATA: Lab Results  Component Value Date   WBC 5.0 12/23/2011   HGB 8.9* 12/23/2011   HCT 26.7* 12/23/2011   MCV 87.5 12/23/2011   PLT 167 12/23/2011      Chemistry      Component Value Date/Time   NA 139 12/09/2011 1222   K 3.2* 12/09/2011 1222   CL 102 12/09/2011 1222   CO2 25 12/09/2011 1222   BUN 17 12/09/2011 1222   CREATININE 1.09 12/09/2011 1222   CREATININE 0.88 03/31/2011 1550      Component Value Date/Time   CALCIUM 7.6* 12/09/2011 1222   CALCIUM 9.5 05/15/2010 2153   ALKPHOS 78 12/09/2011 1222   AST 29 12/09/2011 1222   ALT 19 12/09/2011 1222   BILITOT 0.7 12/09/2011 1222     ADDITIONAL INFORMATION: 1. PROGNOSTIC INDICATORS - ACIS Results IMMUNOHISTOCHEMICAL AND MORPHOMETRIC ANALYSIS BY THE AUTOMATED CELLULAR IMAGING SYSTEM (ACIS) Estrogen Receptor (Negative, <1%): 0%, NEGATIVE Progesterone Receptor (Negative, <1%): 0%, NEGATIVE COMMENT: The negative hormone receptor study(ies) in this case have an internal positive control. All controls stained appropriately Abigail Miyamoto MD Pathologist, Electronic Signature ( Signed 10/07/2011) FINAL DIAGNOSIS 1 of 4 FINAL for [name], [name] ([id number]) Diagnosis 1. Breast, lumpectomy, Left - INVASIVE GRADE III, DUCTAL CARCINOMA, SPANNING 0.7 CM. - ASSOCIATED HIGH GRADED DUCTAL CARCINOMA IN SITU. - LYMPH/VASCULAR INVASION NOT IDENTIFIED. - MARGINS ARE NEGATIVE. - SEE ONCOLOGY TEMPLATE. 2. Lymph node, sentinel, biopsy, Left axillary#1 - ONE BENIGN LYMPH NODE WITH NO TUMOR (0/1). - BENIGN GLANDULAR EPITHELIAL INCLUSIONS PRESENT. - SEE COMMENT. 3. Breast, excision, Posterior margin - BENIGN BREAST PARENCHYMA. - NO ATYPIA, HYPERPLASIA, OR MALIGNANCY IDENTIFIED. 4. Lymph node, sentinel, biopsy, Left  axillary #2 - ONE BENIGN LYMPH NODE WITH NO TUMOR SEEN (0/1). Microscopic Comment  1. BREAST, INVASIVE TUMOR, WITH LYMPH NODE SAMPLING Specimen, including laterality: Left breast with posterior margin and sentinel lymph nodes. Procedure: Left breast lumpectomy with posterior margin excision and sentinel lymph node biopsies. Grade: III. Tubule formation: 3. Nuclear pleomorphism: 3. Mitotic:3. Tumor size (gross measurement and glass slide measurement): 0.7 cm. Margins: Invasive, distance to closest margin: At least 0.8 cm. In-situ, distance to closest margin: At least 0.8 cm. Lymphovascular invasion: Not identified. Ductal carcinoma in situ: Yes. Grade: High grade. Extensive intraductal component: No. Lobular neoplasia: No. Tumor focality: Unifocal. Treatment effect: N/A. Extent of tumor: Confined to breast parenchyma. Lymph nodes: # examined: 2. Lymph nodes with metastasis: 0. Breast prognostic profile: Performed on previous case ([id number]) Estrogen receptor: 0%, negative. Progesterone receptor: 0%, negative. Her 2 neu: 3.09, amplified. Ki-67: 53%. Non-neoplastic breast: Fat necrosis present. TNM: pT1b, pN0, MX. Comments: An estrogen receptor and progesterone receptor will be repeated on the current tumor and reported in an addendum. (RAH:gt, 10/01/11) 2. The left sentinel axillary lymph node #1 shows benign glandular epithelial inclusions. Some of the inclusions are ciliated. The nuclei are bland appearing and are not malignant. Smooth muscle myosin, p63 and calponin immunohistochemical stains are performed which do not show a myoepithelial layer. 2 of 4 FINAL for Frimpong, MAANSI WIKE ([id number]) Microscopic Comment(continued) Although this is the case, the inclusions are benign and do not represent metastatic carcinoma. Both Dr. Frederica Kuster and Dr. Colonel Bald have seen the left sentinel axillary lymph node in consultation with agreement that the inclusions are benign and do not  represent metastatic carcinoma. Zandra Abts MD Pathologist, Electronic Signature (Case signed 10/02/2011) Specimen Gross and Clinical Information Specimen(s) Obtained: 1. Breast, lumpectomy, Left 2. Lymph node, sentinel, biopsy, Left axillary#1 3. Breast, excision, Posterior margin 4. Lymph node, sentinel, biopsy, Left axillary #2 Specimen Clinical  RADIOGRAPHIC STUDIES:  ASSESSMENT: 66 year old with   1. 0.7 cm high grade invasive ductal carcinoma that is ER-, PR- Her2Neu +, sentinel node negative (T1bN0) pathologic stage I.Because patient is HER-2 positive she is receiving adjuvant chemotherapy and Herceptin. She is going to be receiving 4 cycles of TCH combination and then Herceptin alone to complete out 1years worth of treatment.  #2Peripheral paresthesias secondary to Taxotere.  #3 anemia due to chemotherapy.  #4 nausea vomiting secondary to chemotherapy.    PLAN:   #1 patient will proceed with Herceptin and Taxotere as well as carboplatinum today this is her cycle three out of four. She will receive day 2 Neulasta tomorrow.  #2 we will monitor the anemia I do not think at this point she needs a transfusion since she is asymptomatic.  #3 for her nausea and vomiting I have given her a prescription for Phenergan. She was also given a prescription for Ativan as well his oxycodone.  #4 peripheral paresthesias a prescription for Neurontin 100 mg twice a day was given.   #5 patient will return in one week's time for Herceptin only.   All questions were answered. The patient knows to call the clinic with any problems, questions or concerns. We can certainly see the patient much sooner if necessary.  I spent 25 minutes counseling the patient face to face. The total time spent in the appointment was 30 minutes.    Drue Second, MD Medical/Oncology [facility] [phone] (beeper) [phone] (Office)  12/23/2011, 11:04 AM

## 2011-12-24 ENCOUNTER — Encounter (HOSPITAL_COMMUNITY): Payer: Self-pay

## 2011-12-24 ENCOUNTER — Ambulatory Visit: Payer: Medicare Other

## 2011-12-24 ENCOUNTER — Encounter (INDEPENDENT_AMBULATORY_CARE_PROVIDER_SITE_OTHER): Payer: Medicare Other | Admitting: General Surgery

## 2011-12-24 ENCOUNTER — Ambulatory Visit (HOSPITAL_COMMUNITY)
Admission: RE | Admit: 2011-12-24 | Discharge: 2011-12-24 | Disposition: A | Discharge status: Home / Self Care | Payer: Medicare Other | Source: Ambulatory Visit | Attending: Internal Medicine | Admitting: Internal Medicine

## 2011-12-24 ENCOUNTER — Ambulatory Visit (HOSPITAL_BASED_OUTPATIENT_CLINIC_OR_DEPARTMENT_OTHER): Payer: Medicare Other

## 2011-12-24 VITALS — BP 112/79 | HR 86 | Temp 97.0°F

## 2011-12-24 VITALS — BP 122/80 | HR 85 | Ht 65.0 in | Wt 197.0 lb

## 2011-12-24 DIAGNOSIS — Z09 Encounter for follow-up examination after completed treatment for conditions other than malignant neoplasm: Secondary | ICD-10-CM

## 2011-12-24 DIAGNOSIS — R0989 Other specified symptoms and signs involving the circulatory and respiratory systems: Secondary | ICD-10-CM | POA: Insufficient documentation

## 2011-12-24 DIAGNOSIS — C50919 Malignant neoplasm of unspecified site of unspecified female breast: Secondary | ICD-10-CM | POA: Insufficient documentation

## 2011-12-24 DIAGNOSIS — R51 Headache: Secondary | ICD-10-CM | POA: Insufficient documentation

## 2011-12-24 DIAGNOSIS — E785 Hyperlipidemia, unspecified: Secondary | ICD-10-CM | POA: Insufficient documentation

## 2011-12-24 DIAGNOSIS — Z8249 Family history of ischemic heart disease and other diseases of the circulatory system: Secondary | ICD-10-CM | POA: Insufficient documentation

## 2011-12-24 DIAGNOSIS — Z79899 Other long term (current) drug therapy: Secondary | ICD-10-CM | POA: Insufficient documentation

## 2011-12-24 DIAGNOSIS — R0609 Other forms of dyspnea: Secondary | ICD-10-CM | POA: Insufficient documentation

## 2011-12-24 DIAGNOSIS — R609 Edema, unspecified: Secondary | ICD-10-CM | POA: Insufficient documentation

## 2011-12-24 DIAGNOSIS — C50319 Malignant neoplasm of lower-inner quadrant of unspecified female breast: Secondary | ICD-10-CM

## 2011-12-24 DIAGNOSIS — R109 Unspecified abdominal pain: Secondary | ICD-10-CM | POA: Insufficient documentation

## 2011-12-24 DIAGNOSIS — I517 Cardiomegaly: Secondary | ICD-10-CM | POA: Insufficient documentation

## 2011-12-24 DIAGNOSIS — Z9889 Other specified postprocedural states: Secondary | ICD-10-CM | POA: Insufficient documentation

## 2011-12-24 DIAGNOSIS — R42 Dizziness and giddiness: Secondary | ICD-10-CM | POA: Insufficient documentation

## 2011-12-24 MED ORDER — PEGFILGRASTIM INJECTION 6 MG/0.6ML
6.0000 mg | Freq: Once | SUBCUTANEOUS | Status: AC
  Administered 2011-12-24: 6 mg via SUBCUTANEOUS
  Filled 2011-12-24: qty 0.6

## 2011-12-24 NOTE — Progress Notes (Signed)
Patient ID: [name], female   DOB: 1946/01/27, 66 y.o.   MRN: [id number] PCP: Dr. Everardo All Oncologist: Dr. Welton Flakes  HPI:   Ms. Verdejo is a 66 year old female with h/o obesity, short bowel syndrome, diet-controlled DM2, CP s/p multiple Myoviews Last in 11/12 (EF 61% normal perfusion). Referred by Dr. Park Breed for enrollment into cardio-oncology clinic.   Diagnosed May 2013 : stage I ER-/PR-/Her2Neu positive invasive ductal breast cancer  PRIOR THERAPY:  1. 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 ER negative PR negative Her2 Neu positive with Ki -67 53%,  2. S/P porta cath palcement for chemotherapy  #3 patient has begun her adjuvant chemotherapy consisting of Taxotere carboplatinum and Herceptin. Her treatment began in May 2013. A total of 4 cycles of Taxotere carboplatinum and Herceptin combination are planned.   Echo: 11/12 EF 50-55% lateral s' 8.4 (second peak) Echo: 4/13 EF 45-50% lateral s' 8.5 (second peak - 1st peak not seen) Echo: EF 50-55% lateral s' 8.7  She returns for follow up. SOB with exertion. Denies PND/Orthopnea. Completed 2 months of Hercpetin. Denies lower extremity edema.   Review of Systems:     Cardiac Review of Systems: {Y] = yes [ ]  = no  Chest Pain [    ]  Resting SOB [   ] Exertional SOB  Cove.Etienne  ]  Orthopnea [  ]   Pedal Edema [   ]    Palpitations [  ] Syncope  [  ]   Presyncope [   ]  General Review of Systems: [Y] = yes [  ]=no Constitional: recent weight change [ y ]; anorexia [  ]; fatigue [  ]; nausea [  ]; night sweats [  ]; fever [  ]; or chills [  ];                                                                                                                                           Eye : blurred vision [  ]; diplopia [   ]; vision changes [  ];  Amaurosis fugax[  ]; Resp: cough [  ];  wheezing[  ];  hemoptysis[  ]; shortness of breath[  ]; paroxysmal nocturnal  dyspnea[  ]; dyspnea on exertion[  ]; or orthopnea[  ];  GI:  gallstones[  ], Diarrhea/constipation [  ];  dysphagia[ y ]; melena[  ];  hematochezia [  ]; heartburn[  ];   Abdominal pain [ y ]; GU: kidney stones [  ]; hematuria[  ];   dysuria [  ];  nocturia[  ];  history of     obstruction [  ];                 Skin: rash, swelling[  ];, hair loss[  ];  peripheral edema[  ];  or itching[  ]; Musculosketetal: myalgias[  ];  joint swelling[  y];  joint erythema[  ];  joint pain[y  ];  back pain[  ];  Heme/Lymph: bruising[  ];  bleeding[  ];  anemia[  ];  Neuro: TIA[  ];  headaches[y  ];  stroke[  ];  vertigo[  ];  seizures[  ];   paresthesias[  ];  Dizziness Cove.Etienne  ];  Psych:depression[  ]; anxiety[ y ];  Endocrine: diabetes[ y ];  thyroid dysfunction[  ];  Other:    Past Medical History  Diagnosis Date  . Obesity   . PUD (peptic ulcer disease)   . Leukopenia   . Abdominal pain   . Short bowel syndrome   . Internal hemorrhoids without mention of complication   . Stricture and stenosis of esophagus   . Osteoarthrosis, unspecified whether generalized or localized, unspecified site   . Thyrotoxicosis without mention of goiter or other cause, without mention of thyrotoxic crisis or storm   . Other and unspecified hyperlipidemia   . Gout, unspecified   . Diverticulosis of colon (without mention of hemorrhage)   . C. difficile colitis   . VITAMIN B12 DEFICIENCY 08/30/2009  . GOITER, MULTINODULAR 04/02/2009  . HYPOTHYROIDISM, POST-RADIATION 08/13/2009  . ASYMPTOMATIC POSTMENOPAUSAL STATUS 10/11/2008  . Esophageal reflux 06/12/2008  . PONV (postoperative nausea and vomiting)   . Varicose veins   . Type II or unspecified type diabetes mellitus without mention of complication, not stated as uncontrolled     no med in years diet controled  . Blood transfusion   . UTI (urinary tract infection)   . Kidney stones     "several"  . Breast cancer 09/29/11    left  . Unspecified essential hypertension    . Angina   . Shortness of breath on exertion     "sometimes"  . Anemia   . ANEMIA, IRON DEFICIENCY 05/08/2009  . History of lower GI bleeding   . H/O hiatal hernia   . Migraines     Current Outpatient Prescriptions  Medication Sig Dispense Refill  . Calcium Carbonate-Vitamin D (CALCIUM-VITAMIN D) 600-200 MG-UNIT CAPS Take 1 capsule by mouth daily.        . cyanocobalamin (,VITAMIN B-12,) 1000 MCG/ML injection Inject 1,000 mcg into the muscle every 30 (thirty) days. Next one due the 26th of this month      . cyclobenzaprine (FLEXERIL) 5 MG tablet Take 5 mg by mouth 3 (three) times daily as needed.      Marland Kitchen dexamethasone (DECADRON) 4 MG tablet TAKE 2 TABLETS TWICE DAILY DAY PRIOR TO TREATMENT, DAY OF AND DAY AFTER  30 tablet  2  . gabapentin (NEURONTIN) 100 MG capsule Take 1 capsule (100 mg total) by mouth 2 times daily at 12 noon and 4 pm.  60 capsule  3  . levothyroxine (SYNTHROID, LEVOTHROID) 125 MCG tablet Take 125 mcg by mouth daily.      Marland Kitchen lidocaine-prilocaine (EMLA) cream Apply topically as needed.  30 g  0  . LORazepam (ATIVAN) 0.5 MG tablet Take 1 tablet (0.5 mg total) by mouth every 6 (six) hours as needed (Nausea or vomiting).  30 tablet  0  . lovastatin (MEVACOR) 20 MG tablet Take 20 mg by mouth at bedtime.      . magnesium chloride (SLOW-MAG) 64 MG TBEC Take 1 tablet by mouth daily.       Marland Kitchen omeprazole (PRILOSEC) 40 MG capsule Take 1  capsule (40 mg total) by mouth daily.  30 capsule  6  . ondansetron (ZOFRAN) 8 MG tablet Take 1 tablet two times a day starting the day after chemo for 3 days. Then take 1 tab two times a day as needed for nausea or vomiting.  30 tablet  1  . oxyCODONE-acetaminophen (PERCOCET/ROXICET) 5-325 MG per tablet Take 1 tablet by mouth every 6 (six) hours as needed. For pain  60 tablet  0  . potassium chloride SA (K-DUR,KLOR-CON) 20 MEQ tablet Take 20 mEq by mouth daily. As needed      . prochlorperazine (COMPAZINE) 10 MG tablet Take 10 mg by mouth every 6  (six) hours as needed.      . prochlorperazine (COMPAZINE) 25 MG suppository Place 25 mg rectally every 12 (twelve) hours as needed.      . promethazine (PHENERGAN) 25 MG tablet Take 1 tablet (25 mg total) by mouth every 8 (eight) hours as needed. For nausea  30 tablet  0  . thiamine 100 MG tablet Take 100 mg by mouth daily.        . trazodone (DESYREL) 300 MG tablet Take 300 mg by mouth at bedtime.      Marland Kitchen DISCONTD: potassium chloride SA (K-DUR,KLOR-CON) 20 MEQ tablet Take 1 tablet (20 mEq total) by mouth daily.  5 tablet  0  . DISCONTD: prochlorperazine (COMPAZINE) 10 MG tablet Take 1 tablet (10 mg total) by mouth every 6 (six) hours as needed (Nausea or vomiting).  30 tablet  1  . DISCONTD: prochlorperazine (COMPAZINE) 10 MG tablet TAKE 1 TABLET (10 MG TOTAL) BY MOUTH EVERY 6 (SIX) HOURS AS NEEDED.  30 tablet  2  . DISCONTD: prochlorperazine (COMPAZINE) 25 MG suppository Place 1 suppository (25 mg total) rectally every 12 (twelve) hours as needed for nausea.  12 suppository  3   No current facility-administered medications for this encounter.   Facility-Administered Medications Ordered in Other Encounters  Medication Dose Route Frequency Provider Last Rate Last Dose  . 0.9 %  sodium chloride infusion   Intravenous Once Victorino December, MD      . acetaminophen (TYLENOL) tablet 650 mg  650 mg Oral Once Victorino December, MD   650 mg at 12/23/11 1157  . CARBOplatin (PARAPLATIN) 360 mg in sodium chloride 0.9 % 100 mL chemo infusion  360 mg Intravenous Once Victorino December, MD   360 mg at 12/23/11 1428  . dexamethasone (DECADRON) injection 20 mg  20 mg Intravenous Once Victorino December, MD   20 mg at 12/23/11 1157  . diphenhydrAMINE (BENADRYL) capsule 50 mg  50 mg Oral Once Victorino December, MD   50 mg at 12/23/11 1157  . DOCEtaxel (TAXOTERE) 110 mg in dextrose 5 % 250 mL chemo infusion  50 mg/m2 (Treatment Plan Actual) Intravenous Once Victorino December, MD   110 mg at 12/23/11 1315  . heparin lock flush 100  unit/mL  500 Units Intracatheter Once PRN Victorino December, MD   500 Units at 12/23/11 1507  . ondansetron (ZOFRAN) IVPB 16 mg  16 mg Intravenous Once Victorino December, MD   16 mg at 12/23/11 1157  . trastuzumab (HERCEPTIN) 189 mg in sodium chloride 0.9 % 250 mL chemo infusion  2 mg/kg (Treatment Plan Actual) Intravenous Once Victorino December, MD   189 mg at 12/23/11 1236  . DISCONTD: sodium chloride 0.9 % injection 10 mL  10 mL Intracatheter PRN Victorino December, MD  10 mL at 12/23/11 1507     Allergies  Allergen Reactions  . Aspirin Other (See Comments)    REACTION: Gi Intolerance/ Burning in stomach  . Iodine Itching    Allergic to IVP dye  . Morphine And Related Itching    History   Social History  . Marital Status: Married    Spouse Name: N/A    Number of Children: N/A  . Years of Education: N/A   Occupational History  . Not on file.   Social History Main Topics  . Smoking status: Former Smoker -- 1.0 packs/day for 10 years    Types: Cigarettes    Quit date: 09/22/1985  . Smokeless tobacco: Never Used  . Alcohol Use: Yes     09/29/11 "used to drink socially years ago"  . Drug Use: No  . Sexually Active: No   Other Topics Concern  . Not on file   Social History Narrative   Pt gets regular exercise    Family History  Problem Relation Age of Onset  . Esophageal cancer Son     deceased  . Cancer Son     Esophageal Cancer  . Diabetes Mother   . Heart disease Mother   . Colon cancer Paternal Uncle   . Cancer Paternal Uncle     Colon Cancer  . Kidney disease Sister   . Diabetes Father   . Hypertension Father   . Kidney disease Brother   . Kidney disease Brother   . Kidney disease Brother     PHYSICAL EXAM: Filed Vitals:   12/24/11 1019  BP: 122/80  Pulse: 85   General:  Well appearing. No respiratory difficulty HEENT: normal Neck: supple. no JVD. Carotids 2+ bilat; no bruits. No lymphadenopathy or thryomegaly appreciated. Porta cath in place Cor: PMI  nondisplaced. Regular rate & rhythm. No rubs, gallops or murmurs. Lungs: clear Abdomen: obese soft, nontender, nondistended. No hepatosplenomegaly. No bruits or masses. Good bowel sounds. Extremities: no cyanosis, clubbing, rash, edema Neuro: alert & oriented x 3, cranial nerves grossly intact. moves all 4 extremities w/o difficulty. Affect pleasant.   ASSESSMENT & PLAN:

## 2011-12-24 NOTE — Progress Notes (Signed)
  Echocardiogram 2D Echocardiogram has been performed.  [name] 12/24/2011, 10:11 AM

## 2011-12-24 NOTE — Assessment & Plan Note (Signed)
I have reviewed echos in clinic. EF and lateral s' look good. No evidence of cardiotoxicity. Can continue Herceptin. Does have mild dyspnea on exertion which is likely multifactorial - deconditioning, diastolic dysfunction, etc. Encouraged her to walk more and keep weight down. If persists may need further work-up including stress testing.

## 2011-12-24 NOTE — Patient Instructions (Addendum)
Follow in 3 months with ECHO

## 2011-12-28 ENCOUNTER — Other Ambulatory Visit: Payer: Self-pay | Admitting: Internal Medicine

## 2011-12-29 ENCOUNTER — Ambulatory Visit: Payer: Medicare Other

## 2011-12-30 ENCOUNTER — Ambulatory Visit: Payer: Medicare Other

## 2011-12-30 ENCOUNTER — Ambulatory Visit (HOSPITAL_BASED_OUTPATIENT_CLINIC_OR_DEPARTMENT_OTHER): Payer: Medicare Other | Admitting: Oncology

## 2011-12-30 ENCOUNTER — Telehealth: Payer: Self-pay | Admitting: *Deleted

## 2011-12-30 ENCOUNTER — Other Ambulatory Visit (HOSPITAL_BASED_OUTPATIENT_CLINIC_OR_DEPARTMENT_OTHER): Payer: Medicare Other | Admitting: Lab

## 2011-12-30 ENCOUNTER — Ambulatory Visit (HOSPITAL_BASED_OUTPATIENT_CLINIC_OR_DEPARTMENT_OTHER): Payer: Medicare Other

## 2011-12-30 ENCOUNTER — Ambulatory Visit (INDEPENDENT_AMBULATORY_CARE_PROVIDER_SITE_OTHER): Payer: Medicare Other

## 2011-12-30 VITALS — BP 126/83 | HR 108 | Temp 98.9°F | Ht 65.0 in | Wt 212.7 lb

## 2011-12-30 DIAGNOSIS — Z171 Estrogen receptor negative status [ER-]: Secondary | ICD-10-CM

## 2011-12-30 DIAGNOSIS — C50319 Malignant neoplasm of lower-inner quadrant of unspecified female breast: Secondary | ICD-10-CM

## 2011-12-30 DIAGNOSIS — D6481 Anemia due to antineoplastic chemotherapy: Secondary | ICD-10-CM

## 2011-12-30 DIAGNOSIS — Z5112 Encounter for antineoplastic immunotherapy: Secondary | ICD-10-CM

## 2011-12-30 DIAGNOSIS — E538 Deficiency of other specified B group vitamins: Secondary | ICD-10-CM

## 2011-12-30 DIAGNOSIS — R112 Nausea with vomiting, unspecified: Secondary | ICD-10-CM

## 2011-12-30 DIAGNOSIS — C50919 Malignant neoplasm of unspecified site of unspecified female breast: Secondary | ICD-10-CM

## 2011-12-30 LAB — CBC WITH DIFFERENTIAL/PLATELET
Basophils Absolute: 0 10*3/uL (ref 0.0–0.1)
EOS%: 0 % (ref 0.0–7.0)
Eosinophils Absolute: 0 10*3/uL (ref 0.0–0.5)
HCT: 26.9 % — ABNORMAL LOW (ref 34.8–46.6)
HGB: 9 g/dL — ABNORMAL LOW (ref 11.6–15.9)
MONO#: 0.7 10*3/uL (ref 0.1–0.9)
NEUT#: 4.3 10*3/uL (ref 1.5–6.5)
NEUT%: 71.5 % (ref 38.4–76.8)
RDW: 15 % — ABNORMAL HIGH (ref 11.2–14.5)
WBC: 6 10*3/uL (ref 3.9–10.3)
lymph#: 1 10*3/uL (ref 0.9–3.3)

## 2011-12-30 LAB — COMPREHENSIVE METABOLIC PANEL
Albumin: 4.1 g/dL (ref 3.5–5.2)
Alkaline Phosphatase: 85 U/L (ref 39–117)
BUN: 19 mg/dL (ref 6–23)
CO2: 24 mEq/L (ref 19–32)
Calcium: 7.6 mg/dL — ABNORMAL LOW (ref 8.4–10.5)
Chloride: 102 mEq/L (ref 96–112)
Glucose, Bld: 146 mg/dL — ABNORMAL HIGH (ref 70–99)
Potassium: 3.1 mEq/L — ABNORMAL LOW (ref 3.5–5.3)
Sodium: 139 mEq/L (ref 135–145)
Total Protein: 6.5 g/dL (ref 6.0–8.3)

## 2011-12-30 MED ORDER — DIPHENHYDRAMINE HCL 25 MG PO CAPS
50.0000 mg | ORAL_CAPSULE | Freq: Once | ORAL | Status: AC
  Administered 2011-12-30: 50 mg via ORAL

## 2011-12-30 MED ORDER — SODIUM CHLORIDE 0.9 % IJ SOLN
10.0000 mL | INTRAMUSCULAR | Status: DC | PRN
  Administered 2011-12-30: 10 mL
  Filled 2011-12-30: qty 10

## 2011-12-30 MED ORDER — TRASTUZUMAB CHEMO INJECTION 440 MG
2.0000 mg/kg | Freq: Once | INTRAVENOUS | Status: AC
  Administered 2011-12-30: 189 mg via INTRAVENOUS
  Filled 2011-12-30: qty 9

## 2011-12-30 MED ORDER — HEPARIN SOD (PORK) LOCK FLUSH 100 UNIT/ML IV SOLN
500.0000 [IU] | Freq: Once | INTRAVENOUS | Status: AC | PRN
  Administered 2011-12-30: 500 [IU]
  Filled 2011-12-30: qty 5

## 2011-12-30 MED ORDER — SODIUM CHLORIDE 0.9 % IV SOLN
Freq: Once | INTRAVENOUS | Status: AC
  Administered 2011-12-30: 13:00:00 via INTRAVENOUS

## 2011-12-30 MED ORDER — CYANOCOBALAMIN 1000 MCG/ML IJ SOLN
1000.0000 ug | Freq: Once | INTRAMUSCULAR | Status: AC
  Administered 2011-12-30: 1000 ug via INTRAMUSCULAR

## 2011-12-30 MED ORDER — ACETAMINOPHEN 325 MG PO TABS
650.0000 mg | ORAL_TABLET | Freq: Once | ORAL | Status: AC
  Administered 2011-12-30: 650 mg via ORAL

## 2011-12-30 NOTE — Telephone Encounter (Signed)
Gave patient appointment for 01-05-2012 thru 01-13-2012 gave patient appointment for dr.moody for 01-08-2012 starting at 8:00am

## 2011-12-30 NOTE — Patient Instructions (Addendum)
Proceed with herceptin today.  I will see you back on 01/05/12 for next Herceptin treatment  Referred back to Dr. Mitzi Hansen for radiation planning

## 2011-12-30 NOTE — Patient Instructions (Signed)
[facility] Discharge Instructions for Patients Receiving Chemotherapy  Today you received the following chemotherapy agents Herceptin To help prevent nausea and vomiting after your treatment, we encourage you to take your nausea medication as prescribed.  If you develop nausea and vomiting that is not controlled by your nausea medication, call the clinic. If it is after clinic hours your family physician or the after hours number for the clinic or go to the Emergency Department.   BELOW ARE SYMPTOMS THAT SHOULD BE REPORTED IMMEDIATELY:  *FEVER GREATER THAN 100.5 F  *CHILLS WITH OR WITHOUT FEVER  NAUSEA AND VOMITING THAT IS NOT CONTROLLED WITH YOUR NAUSEA MEDICATION  *UNUSUAL SHORTNESS OF BREATH  *UNUSUAL BRUISING OR BLEEDING  TENDERNESS IN MOUTH AND THROAT WITH OR WITHOUT PRESENCE OF ULCERS  *URINARY PROBLEMS  *BOWEL PROBLEMS  UNUSUAL RASH Items with * indicate a potential emergency and should be followed up as soon as possible.  One of the nurses will contact you 24 hours after your treatment. Please let the nurse know about any problems that you may have experienced. Feel free to call the clinic you have any questions or concerns. The clinic phone number is [phone].   I have been informed and understand all the instructions given to me. I know to contact the clinic, my physician, or go to the Emergency Department if any problems should occur. I do not have any questions at this time, but understand that I may call the clinic during office hours   should I have any questions or need assistance in obtaining follow up care.    __________________________________________  _____________  __________ Signature of Patient or Authorized Representative            Date                   Time    __________________________________________ Nurse's Signature    

## 2011-12-31 ENCOUNTER — Ambulatory Visit: Payer: Medicare Other

## 2011-12-31 NOTE — Progress Notes (Signed)
OFFICE PROGRESS NOTE  CC  [name], MD [address] [facility] 4th [address] [place] [address] Dr. 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 ER negative PR negative Her2 Neu positive with Ki -67 53%,   2. S/P porta cath palcement for chemotherapy  #3 patient has begun her adjuvant chemotherapy consisting of Taxotere carboplatinum and Herceptin. Her treatment began in May 2013. A total of 4 cycles of Taxotere carboplatinum and Herceptin combination are planned. Once she completes this she will then proceed to radiation therapy with concomitant Herceptin to be given every 3 weeks to finish out a year of treatment.  CURRENT THERAPY: Patient is here for Herceptin only.  INTERVAL HISTORY: [name] 66 y.o. female returns for follow up. Overall she tells me she feels well. She tolerated cycle #3 of TCH quite well last week. She is a little fatigued but otherwise no other complaints. She has no peripheral paresthesias no nausea or vomiting currently but she does develop it and she does have antiemetics prescribed. She has no shortness of breath no cough no bleeding. Remainder of the 10 point review of systems is negative. MEDICAL HISTORY: Past Medical History  Diagnosis Date  . Obesity   . PUD (peptic ulcer disease)   . Leukopenia   . Abdominal pain   . Short bowel syndrome   . Internal hemorrhoids without mention of complication   . Stricture and stenosis of esophagus   . Osteoarthrosis, unspecified whether generalized or localized, unspecified site   . Thyrotoxicosis without mention of goiter or other cause, without mention of thyrotoxic crisis or storm   . Other and unspecified hyperlipidemia   . Gout, unspecified   . Diverticulosis of colon (without  mention of hemorrhage)   . C. difficile colitis   . VITAMIN B12 DEFICIENCY 08/30/2009  . GOITER, MULTINODULAR 04/02/2009  . HYPOTHYROIDISM, POST-RADIATION 08/13/2009  . ASYMPTOMATIC POSTMENOPAUSAL STATUS 10/11/2008  . Esophageal reflux 06/12/2008  . PONV (postoperative nausea and vomiting)   . Varicose veins   . Type II or unspecified type diabetes mellitus without mention of complication, not stated as uncontrolled     no med in years diet controled  . Blood transfusion   . UTI (urinary tract infection)   . Kidney stones     "several"  . Breast cancer 09/29/11    left  . Unspecified essential hypertension   . Angina   . Shortness of breath on exertion     "sometimes"  . Anemia   . ANEMIA, IRON DEFICIENCY 05/08/2009  . History of lower GI bleeding   . H/O hiatal hernia   . Migraines     ALLERGIES:  is allergic to aspirin; iodine; and morphine and related.  MEDICATIONS:  Current Outpatient Prescriptions  Medication Sig Dispense Refill  . Calcium Carbonate-Vitamin D (CALCIUM-VITAMIN D) 600-200 MG-UNIT CAPS Take 1 capsule by mouth daily.        . cyanocobalamin (,VITAMIN B-12,) 1000 MCG/ML injection Inject 1,000 mcg into the muscle every 30 (thirty) days. Next one due the 26th of this month      . cyclobenzaprine (FLEXERIL) 5 MG tablet Take 5 mg by mouth 3 (three) times daily as needed.      Marland Kitchen dexamethasone (DECADRON) 4 MG tablet TAKE 2 TABLETS TWICE  DAILY DAY PRIOR TO TREATMENT, DAY OF AND DAY AFTER  30 tablet  2  . gabapentin (NEURONTIN) 100 MG capsule Take 1 capsule (100 mg total) by mouth 2 times daily at 12 noon and 4 pm.  60 capsule  3  . levothyroxine (SYNTHROID, LEVOTHROID) 125 MCG tablet Take 125 mcg by mouth daily.      Marland Kitchen lidocaine-prilocaine (EMLA) cream Apply topically as needed.  30 g  0  . LORazepam (ATIVAN) 0.5 MG tablet Take 1 tablet (0.5 mg total) by mouth every 6 (six) hours as needed (Nausea or vomiting).  30 tablet  0  . lovastatin (MEVACOR) 20 MG tablet Take 20 mg  by mouth at bedtime.      . magnesium chloride (SLOW-MAG) 64 MG TBEC Take 1 tablet by mouth daily.       Marland Kitchen omeprazole (PRILOSEC) 40 MG capsule Take 1 capsule (40 mg total) by mouth daily.  30 capsule  6  . ondansetron (ZOFRAN) 8 MG tablet Take 1 tablet two times a day starting the day after chemo for 3 days. Then take 1 tab two times a day as needed for nausea or vomiting.  30 tablet  1  . oxyCODONE-acetaminophen (PERCOCET/ROXICET) 5-325 MG per tablet Take 1 tablet by mouth every 6 (six) hours as needed. For pain  60 tablet  0  . potassium chloride SA (K-DUR,KLOR-CON) 20 MEQ tablet Take 20 mEq by mouth daily. As needed      . prochlorperazine (COMPAZINE) 10 MG tablet Take 10 mg by mouth every 6 (six) hours as needed.      . prochlorperazine (COMPAZINE) 25 MG suppository Place 25 mg rectally every 12 (twelve) hours as needed.      . promethazine (PHENERGAN) 25 MG tablet Take 1 tablet (25 mg total) by mouth every 8 (eight) hours as needed. For nausea  30 tablet  0  . thiamine 100 MG tablet Take 100 mg by mouth daily.        . trazodone (DESYREL) 300 MG tablet Take 300 mg by mouth at bedtime.       No current facility-administered medications for this visit.   Facility-Administered Medications Ordered in Other Visits  Medication Dose Route Frequency Provider Last Rate Last Dose  . DISCONTD: sodium chloride 0.9 % injection 10 mL  10 mL Intracatheter PRN Victorino December, MD   10 mL at 12/30/11 1407    SURGICAL HISTORY:  Past Surgical History  Procedure Date  . Vein ligation and stripping 1980's    Right leg  . Lithotripsy     "4 or 5 times"  . Bunionectomy 1970's    bilateral  . Colonoscopy   . Esophagogastroduodenoscopy 07/15/2005  . Thyroid ultrasound 12/1994 and 12/1995  . Mastectomy w/ nodes partial 09/29/11    left  . Port a cath placement 09/29/11    right chest  . Breast surgery   . Cholecystectomy 1990's  . Abdominal hysterectomy 1970's    with BSO  . Dilation and curettage of  uterus   . Colon surgery     "several surgeries for short bowel syndrome"  . Abdominal adhesion surgery 1980's thru 1990's    "several"  . Kidney stone surgery 1990's    "tried to go up & get it but pushed it further up"  . Portacath placement 09/29/2011    Procedure: INSERTION PORT-A-CATH;  Surgeon: Ernestene Mention, MD;  Location: [facility] OR;  Service: General;  Laterality: N/A;    REVIEW OF  SYSTEMS:  Pertinent items are noted in HPI.   PHYSICAL EXAMINATION: General appearance: alert, cooperative and appears stated age Resp: clear to auscultation bilaterally and normal percussion bilaterally Cardio: regular rate and rhythm, S1, S2 normal, no murmur, click, rub or gallop GI: soft, non-tender; bowel sounds normal; no masses,  no organomegaly Extremities: extremities normal, atraumatic, no cyanosis or edema Neurologic: Grossly normal Bilateral Breast Exam: left breast healing incision scar, with some tenderness, no evidence of infections.Right breast no masses or nipple discharge ECOG PERFORMANCE STATUS: 1 - Symptomatic but completely ambulatory  Blood pressure 126/83, pulse 108, temperature 98.9 F (37.2 C), temperature source Oral, height 5\' 5"  (1.651 m), weight 212 lb 11.2 oz (96.48 kg).  LABORATORY DATA: Lab Results  Component Value Date   WBC 6.0 12/30/2011   HGB 9.0* 12/30/2011   HCT 26.9* 12/30/2011   MCV 87.3 12/30/2011   PLT 102* 12/30/2011      Chemistry      Component Value Date/Time   NA 139 12/30/2011 1122   K 3.1* 12/30/2011 1122   CL 102 12/30/2011 1122   CO2 24 12/30/2011 1122   BUN 19 12/30/2011 1122   CREATININE 1.07 12/30/2011 1122   CREATININE 0.88 03/31/2011 1550      Component Value Date/Time   CALCIUM 7.6* 12/30/2011 1122   CALCIUM 9.5 05/15/2010 2153   ALKPHOS 85 12/30/2011 1122   AST 21 12/30/2011 1122   ALT 15 12/30/2011 1122   BILITOT 1.0 12/30/2011 1122     ADDITIONAL INFORMATION: 1. PROGNOSTIC INDICATORS - ACIS Results IMMUNOHISTOCHEMICAL AND  MORPHOMETRIC ANALYSIS BY THE AUTOMATED CELLULAR IMAGING SYSTEM (ACIS) Estrogen Receptor (Negative, <1%): 0%, NEGATIVE Progesterone Receptor (Negative, <1%): 0%, NEGATIVE COMMENT: The negative hormone receptor study(ies) in this case have an internal positive control. All controls stained appropriately Abigail Miyamoto MD Pathologist, Electronic Signature ( Signed 10/07/2011) FINAL DIAGNOSIS 1 of 4 FINAL for Marrone, Gwendolin O ([id number]) Diagnosis 1. Breast, lumpectomy, Left - INVASIVE GRADE III, DUCTAL CARCINOMA, SPANNING 0.7 CM. - ASSOCIATED HIGH GRADED DUCTAL CARCINOMA IN SITU. - LYMPH/VASCULAR INVASION NOT IDENTIFIED. - MARGINS ARE NEGATIVE. - SEE ONCOLOGY TEMPLATE. 2. Lymph node, sentinel, biopsy, Left axillary#1 - ONE BENIGN LYMPH NODE WITH NO TUMOR (0/1). - BENIGN GLANDULAR EPITHELIAL INCLUSIONS PRESENT. - SEE COMMENT. 3. Breast, excision, Posterior margin - BENIGN BREAST PARENCHYMA. - NO ATYPIA, HYPERPLASIA, OR MALIGNANCY IDENTIFIED. 4. Lymph node, sentinel, biopsy, Left axillary #2 - ONE BENIGN LYMPH NODE WITH NO TUMOR SEEN (0/1). Microscopic Comment 1. BREAST, INVASIVE TUMOR, WITH LYMPH NODE SAMPLING Specimen, including laterality: Left breast with posterior margin and sentinel lymph nodes. Procedure: Left breast lumpectomy with posterior margin excision and sentinel lymph node biopsies. Grade: III. Tubule formation: 3. Nuclear pleomorphism: 3. Mitotic:3. Tumor size (gross measurement and glass slide measurement): 0.7 cm. Margins: Invasive, distance to closest margin: At least 0.8 cm. In-situ, distance to closest margin: At least 0.8 cm. Lymphovascular invasion: Not identified. Ductal carcinoma in situ: Yes. Grade: High grade. Extensive intraductal component: No. Lobular neoplasia: No. Tumor focality: Unifocal. Treatment effect: N/A. Extent of tumor: Confined to breast parenchyma. Lymph nodes: # examined: 2. Lymph nodes with metastasis: 0. Breast  prognostic profile: Performed on previous case ([id number]) Estrogen receptor: 0%, negative. Progesterone receptor: 0%, negative. Her 2 neu: 3.09, amplified. Ki-67: 53%. Non-neoplastic breast: Fat necrosis present. TNM: pT1b, pN0, MX. Comments: An estrogen receptor and progesterone receptor will be repeated on the current tumor and reported in an addendum. (RAH:gt, 10/01/11) 2. The left sentinel axillary lymph node #  1 shows benign glandular epithelial inclusions. Some of the inclusions are ciliated. The nuclei are bland appearing and are not malignant. Smooth muscle myosin, p63 and calponin immunohistochemical stains are performed which do not show a myoepithelial layer. 2 of 4 FINAL for Brossard, ERIENNE SPELMAN ([id number]) Microscopic Comment(continued) Although this is the case, the inclusions are benign and do not represent metastatic carcinoma. Both Dr. Frederica Kuster and Dr. Colonel Bald have seen the left sentinel axillary lymph node in consultation with agreement that the inclusions are benign and do not represent metastatic carcinoma. Zandra Abts MD Pathologist, Electronic Signature (Case signed 10/02/2011) Specimen Gross and Clinical Information Specimen(s) Obtained: 1. Breast, lumpectomy, Left 2. Lymph node, sentinel, biopsy, Left axillary#1 3. Breast, excision, Posterior margin 4. Lymph node, sentinel, biopsy, Left axillary #2 Specimen Clinical  RADIOGRAPHIC STUDIES:  ASSESSMENT: 66 year old with   1. 0.7 cm high grade invasive ductal carcinoma that is ER-, PR- Her2Neu +, sentinel node negative (T1bN0) pathologic stage I.Because patient is HER-2 positive she is receiving adjuvant chemotherapy and Herceptin. She is going to be receiving 4 cycles of TCH combination and then Herceptin alone to complete out 1years worth of treatment.  #2Peripheral paresthesias secondary to Taxotere.  #3 anemia due to chemotherapy.  #4 nausea vomiting secondary to chemotherapy.    PLAN:   #1 Proceed  with Herceptin today as she is scheduled.  #2 we will monitor the anemia I do not think at this point she needs a transfusion since she is asymptomatic.  #3 for her nausea and vomiting I have given her a prescription for Phenergan. She was also given a prescription for Ativan as well his oxycodone.  #4 peripheral paresthesias a prescription for Neurontin 100 mg twice a day was given.   #5 patient will return in one week's time for Herceptin only.   All questions were answered. The patient knows to call the clinic with any problems, questions or concerns. We can certainly see the patient much sooner if necessary.  I spent 25 minutes counseling the patient face to face. The total time spent in the appointment was 30 minutes.    Drue Second, MD Medical/Oncology [facility] [phone] (beeper) [phone] (Office)  12/31/2011, 5:09 PM

## 2012-01-01 ENCOUNTER — Other Ambulatory Visit: Payer: Self-pay | Admitting: Oncology

## 2012-01-01 ENCOUNTER — Telehealth: Payer: Self-pay | Admitting: Medical Oncology

## 2012-01-01 NOTE — Telephone Encounter (Signed)
Spoke to patient regarding low potassium level, per MD, patient to take K-Dur 20 meq po daily for 5 days.  Patient expressed understanding, no questions at this time.

## 2012-01-01 NOTE — Telephone Encounter (Signed)
Message copied by Tylene Fantasia on Fri Jan 01, 2012  9:41 AM ------      Message from: Victorino December      Created: Thu Dec 31, 2011  5:25 PM       Please call patient: take k-dur 20 meq po daily x 5 days

## 2012-01-04 ENCOUNTER — Other Ambulatory Visit: Payer: Self-pay | Admitting: Endocrinology

## 2012-01-05 ENCOUNTER — Ambulatory Visit (HOSPITAL_BASED_OUTPATIENT_CLINIC_OR_DEPARTMENT_OTHER): Payer: Medicare Other

## 2012-01-05 ENCOUNTER — Encounter: Payer: Self-pay | Admitting: Oncology

## 2012-01-05 ENCOUNTER — Other Ambulatory Visit (HOSPITAL_BASED_OUTPATIENT_CLINIC_OR_DEPARTMENT_OTHER): Payer: Medicare Other | Admitting: Lab

## 2012-01-05 ENCOUNTER — Ambulatory Visit (HOSPITAL_BASED_OUTPATIENT_CLINIC_OR_DEPARTMENT_OTHER): Payer: Medicare Other | Admitting: Oncology

## 2012-01-05 ENCOUNTER — Encounter: Payer: Self-pay | Admitting: *Deleted

## 2012-01-05 VITALS — BP 119/78 | HR 98 | Temp 97.9°F | Resp 20 | Ht 65.0 in | Wt 217.9 lb

## 2012-01-05 DIAGNOSIS — R5383 Other fatigue: Secondary | ICD-10-CM

## 2012-01-05 DIAGNOSIS — Z5112 Encounter for antineoplastic immunotherapy: Secondary | ICD-10-CM

## 2012-01-05 DIAGNOSIS — C50319 Malignant neoplasm of lower-inner quadrant of unspecified female breast: Secondary | ICD-10-CM

## 2012-01-05 DIAGNOSIS — Z171 Estrogen receptor negative status [ER-]: Secondary | ICD-10-CM

## 2012-01-05 LAB — CBC WITH DIFFERENTIAL/PLATELET
EOS%: 0 % (ref 0.0–7.0)
Eosinophils Absolute: 0 10*3/uL (ref 0.0–0.5)
MCH: 29.3 pg (ref 25.1–34.0)
MCV: 89.8 fL (ref 79.5–101.0)
MONO%: 7.1 % (ref 0.0–14.0)
NEUT#: 8.1 10*3/uL — ABNORMAL HIGH (ref 1.5–6.5)
RBC: 2.83 10*6/uL — ABNORMAL LOW (ref 3.70–5.45)
RDW: 16.3 % — ABNORMAL HIGH (ref 11.2–14.5)
lymph#: 1.8 10*3/uL (ref 0.9–3.3)
nRBC: 1 % — ABNORMAL HIGH (ref 0–0)

## 2012-01-05 LAB — COMPREHENSIVE METABOLIC PANEL
ALT: 20 U/L (ref 0–35)
Albumin: 3.6 g/dL (ref 3.5–5.2)
Alkaline Phosphatase: 76 U/L (ref 39–117)
Potassium: 3.3 mEq/L — ABNORMAL LOW (ref 3.5–5.3)
Sodium: 143 mEq/L (ref 135–145)
Total Bilirubin: 1 mg/dL (ref 0.3–1.2)
Total Protein: 6.1 g/dL (ref 6.0–8.3)

## 2012-01-05 MED ORDER — ACETAMINOPHEN 325 MG PO TABS
650.0000 mg | ORAL_TABLET | Freq: Once | ORAL | Status: AC
  Administered 2012-01-05: 650 mg via ORAL

## 2012-01-05 MED ORDER — DIPHENHYDRAMINE HCL 25 MG PO CAPS
50.0000 mg | ORAL_CAPSULE | Freq: Once | ORAL | Status: AC
  Administered 2012-01-05: 50 mg via ORAL

## 2012-01-05 MED ORDER — TRASTUZUMAB CHEMO INJECTION 440 MG
2.0000 mg/kg | Freq: Once | INTRAVENOUS | Status: AC
  Administered 2012-01-05: 189 mg via INTRAVENOUS
  Filled 2012-01-05: qty 9

## 2012-01-05 MED ORDER — HEPARIN SOD (PORK) LOCK FLUSH 100 UNIT/ML IV SOLN
500.0000 [IU] | Freq: Once | INTRAVENOUS | Status: AC | PRN
  Administered 2012-01-05: 500 [IU]
  Filled 2012-01-05: qty 5

## 2012-01-05 MED ORDER — SODIUM CHLORIDE 0.9 % IJ SOLN
10.0000 mL | INTRAMUSCULAR | Status: DC | PRN
  Administered 2012-01-05: 10 mL
  Filled 2012-01-05: qty 10

## 2012-01-05 MED ORDER — SODIUM CHLORIDE 0.9 % IV SOLN
Freq: Once | INTRAVENOUS | Status: AC
  Administered 2012-01-05: 14:00:00 via INTRAVENOUS

## 2012-01-05 NOTE — Patient Instructions (Addendum)
Proceed with herceptin today.  Return in 1 week for [facility]  Radiation Oncology consult this Friday

## 2012-01-05 NOTE — Progress Notes (Signed)
OFFICE PROGRESS NOTE  CC  [name], MD [address] [facility] 4th Floor [place] [address] Dr. 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 ER negative PR negative Her2 Neu positive with Ki -67 53%,   2. S/P porta cath palcement for chemotherapy  #3 patient has begun her adjuvant chemotherapy consisting of Taxotere carboplatinum and Herceptin. Her treatment began in May 2013. A total of 4 cycles of Taxotere carboplatinum and Herceptin combination are planned. Once she completes this she will then proceed to radiation therapy with concomitant Herceptin to be given every 3 weeks to finish out a year of treatment.  CURRENT THERAPY: Patient is here for Herceptin only.  INTERVAL HISTORY: [name] 66 y.o. female returns for follow up. Overall she tells me she feels well.She is a little fatigued but otherwise no other complaints. She has Minor peripheral paresthesias,  no nausea or vomiting currently but she does develop it and she does have antiemetics prescribed. She has no shortness of breath no cough no bleeding. Remainder of the 10 point review of systems is negative. MEDICAL HISTORY: Past Medical History  Diagnosis Date  . Obesity   . PUD (peptic ulcer disease)   . Leukopenia   . Abdominal pain   . Short bowel syndrome   . Internal hemorrhoids without mention of complication   . Stricture and stenosis of esophagus   . Osteoarthrosis, unspecified whether generalized or localized, unspecified site   . Thyrotoxicosis without mention of goiter or other cause, without mention of thyrotoxic crisis or storm   . Other and unspecified hyperlipidemia   . Gout, unspecified   . Diverticulosis of colon (without mention of hemorrhage)   . C. difficile  colitis   . VITAMIN B12 DEFICIENCY 08/30/2009  . GOITER, MULTINODULAR 04/02/2009  . HYPOTHYROIDISM, POST-RADIATION 08/13/2009  . ASYMPTOMATIC POSTMENOPAUSAL STATUS 10/11/2008  . Esophageal reflux 06/12/2008  . PONV (postoperative nausea and vomiting)   . Varicose veins   . Type II or unspecified type diabetes mellitus without mention of complication, not stated as uncontrolled     no med in years diet controled  . Blood transfusion   . UTI (urinary tract infection)   . Kidney stones     "several"  . Breast cancer 09/29/11    left  . Unspecified essential hypertension   . Angina   . Shortness of breath on exertion     "sometimes"  . Anemia   . ANEMIA, IRON DEFICIENCY 05/08/2009  . History of lower GI bleeding   . H/O hiatal hernia   . Migraines     ALLERGIES:  is allergic to aspirin; iodine; and morphine and related.  MEDICATIONS:  Current Outpatient Prescriptions  Medication Sig Dispense Refill  . Calcium Carbonate-Vitamin D (CALCIUM-VITAMIN D) 600-200 MG-UNIT CAPS Take 1 capsule by mouth daily.        . cyanocobalamin (,VITAMIN B-12,) 1000 MCG/ML injection Inject 1,000 mcg into the muscle every 30 (thirty) days. Next one due the 26th of this month      . cyclobenzaprine (FLEXERIL) 5 MG tablet Take 5 mg by mouth 3 (three) times daily as needed.      Marland Kitchen dexamethasone (DECADRON) 4 MG tablet TAKE 2 TABLETS TWICE DAILY DAY PRIOR TO TREATMENT, DAY OF AND DAY AFTER  30 tablet  2  . gabapentin (NEURONTIN) 100 MG capsule Take 1 capsule (100 mg total) by mouth 2 times daily at 12 noon and 4 pm.  60 capsule  3  . KLOR-CON M20 20 MEQ tablet TAKE 1 TABLET (20 MEQ TOTAL) BY MOUTH DAILY.  5 tablet  0  . levothyroxine (SYNTHROID, LEVOTHROID) 125 MCG tablet 1 TAB ONCE DAILY  30 tablet  5  . lidocaine-prilocaine (EMLA) cream Apply topically as needed.  30 g  0  . LORazepam (ATIVAN) 0.5 MG tablet Take 1 tablet (0.5 mg total) by mouth every 6 (six) hours as needed (Nausea or vomiting).  30 tablet  0  .  lovastatin (MEVACOR) 20 MG tablet Take 20 mg by mouth at bedtime.      . magnesium chloride (SLOW-MAG) 64 MG TBEC Take 1 tablet by mouth daily.       Marland Kitchen omeprazole (PRILOSEC) 40 MG capsule Take 1 capsule (40 mg total) by mouth daily.  30 capsule  6  . ondansetron (ZOFRAN) 8 MG tablet Take 1 tablet two times a day starting the day after chemo for 3 days. Then take 1 tab two times a day as needed for nausea or vomiting.  30 tablet  1  . oxyCODONE-acetaminophen (PERCOCET/ROXICET) 5-325 MG per tablet Take 1 tablet by mouth every 6 (six) hours as needed. For pain  60 tablet  0  . potassium chloride SA (K-DUR,KLOR-CON) 20 MEQ tablet Take 20 mEq by mouth daily. As needed      . prochlorperazine (COMPAZINE) 10 MG tablet Take 10 mg by mouth every 6 (six) hours as needed.      . prochlorperazine (COMPAZINE) 25 MG suppository Place 25 mg rectally every 12 (twelve) hours as needed.      . promethazine (PHENERGAN) 25 MG tablet Take 1 tablet (25 mg total) by mouth every 8 (eight) hours as needed. For nausea  30 tablet  0  . thiamine 100 MG tablet Take 100 mg by mouth daily.        . trazodone (DESYREL) 300 MG tablet Take 300 mg by mouth at bedtime.        SURGICAL HISTORY:  Past Surgical History  Procedure Date  . Vein ligation and stripping 1980's    Right leg  . Lithotripsy     "4 or 5 times"  . Bunionectomy 1970's    bilateral  . Colonoscopy   . Esophagogastroduodenoscopy 07/15/2005  . Thyroid ultrasound 12/1994 and 12/1995  . Mastectomy w/ nodes partial 09/29/11    left  . Port a cath placement 09/29/11    right chest  . Breast surgery   . Cholecystectomy 1990's  . Abdominal hysterectomy 1970's    with BSO  . Dilation and curettage of uterus   . Colon surgery     "several surgeries for short bowel syndrome"  . Abdominal adhesion surgery 1980's thru 1990's    "several"  . Kidney stone surgery 1990's    "tried to go up & get it but pushed it further up"  . Portacath placement 09/29/2011     Procedure: INSERTION PORT-A-CATH;  Surgeon: Ernestene Mention, MD;  Location: [facility] OR;  Service: General;  Laterality: N/A;    REVIEW OF SYSTEMS:  Pertinent items are noted in HPI.   PHYSICAL EXAMINATION: General appearance: alert, cooperative and appears stated age Resp: clear to auscultation bilaterally and normal percussion bilaterally Cardio: regular rate and rhythm, S1, S2 normal, no murmur, click, rub or gallop GI:  soft, non-tender; bowel sounds normal; no masses,  no organomegaly Extremities: extremities normal, atraumatic, no cyanosis or edema Neurologic: Grossly normal Bilateral Breast Exam: left breast healing incision scar, with some tenderness, no evidence of infections.Right breast no masses or nipple discharge ECOG PERFORMANCE STATUS: 1 - Symptomatic but completely ambulatory  Blood pressure 119/78, pulse 98, temperature 97.9 F (36.6 C), resp. rate 20, height 5\' 5"  (1.651 m), weight 217 lb 14.4 oz (98.839 kg).  LABORATORY DATA: Lab Results  Component Value Date   WBC 10.7* 01/05/2012   HGB 8.3* 01/05/2012   HCT 25.4* 01/05/2012   MCV 89.8 01/05/2012   PLT 118* 01/05/2012      Chemistry      Component Value Date/Time   NA 139 12/30/2011 1122   K 3.1* 12/30/2011 1122   CL 102 12/30/2011 1122   CO2 24 12/30/2011 1122   BUN 19 12/30/2011 1122   CREATININE 1.07 12/30/2011 1122   CREATININE 0.88 03/31/2011 1550      Component Value Date/Time   CALCIUM 7.6* 12/30/2011 1122   CALCIUM 9.5 05/15/2010 2153   ALKPHOS 85 12/30/2011 1122   AST 21 12/30/2011 1122   ALT 15 12/30/2011 1122   BILITOT 1.0 12/30/2011 1122     ADDITIONAL INFORMATION: 1. PROGNOSTIC INDICATORS - ACIS Results IMMUNOHISTOCHEMICAL AND MORPHOMETRIC ANALYSIS BY THE AUTOMATED CELLULAR IMAGING SYSTEM (ACIS) Estrogen Receptor (Negative, <1%): 0%, NEGATIVE Progesterone Receptor (Negative, <1%): 0%, NEGATIVE COMMENT: The negative hormone receptor study(ies) in this case have an internal positive control. All controls  stained appropriately Abigail Miyamoto MD Pathologist, Electronic Signature ( Signed 10/07/2011) FINAL DIAGNOSIS 1 of 4 FINAL for Goatley, Janesha O ([id number]) Diagnosis 1. Breast, lumpectomy, Left - INVASIVE GRADE III, DUCTAL CARCINOMA, SPANNING 0.7 CM. - ASSOCIATED HIGH GRADED DUCTAL CARCINOMA IN SITU. - LYMPH/VASCULAR INVASION NOT IDENTIFIED. - MARGINS ARE NEGATIVE. - SEE ONCOLOGY TEMPLATE. 2. Lymph node, sentinel, biopsy, Left axillary#1 - ONE BENIGN LYMPH NODE WITH NO TUMOR (0/1). - BENIGN GLANDULAR EPITHELIAL INCLUSIONS PRESENT. - SEE COMMENT. 3. Breast, excision, Posterior margin - BENIGN BREAST PARENCHYMA. - NO ATYPIA, HYPERPLASIA, OR MALIGNANCY IDENTIFIED. 4. Lymph node, sentinel, biopsy, Left axillary #2 - ONE BENIGN LYMPH NODE WITH NO TUMOR SEEN (0/1). Microscopic Comment 1. BREAST, INVASIVE TUMOR, WITH LYMPH NODE SAMPLING Specimen, including laterality: Left breast with posterior margin and sentinel lymph nodes. Procedure: Left breast lumpectomy with posterior margin excision and sentinel lymph node biopsies. Grade: III. Tubule formation: 3. Nuclear pleomorphism: 3. Mitotic:3. Tumor size (gross measurement and glass slide measurement): 0.7 cm. Margins: Invasive, distance to closest margin: At least 0.8 cm. In-situ, distance to closest margin: At least 0.8 cm. Lymphovascular invasion: Not identified. Ductal carcinoma in situ: Yes. Grade: High grade. Extensive intraductal component: No. Lobular neoplasia: No. Tumor focality: Unifocal. Treatment effect: N/A. Extent of tumor: Confined to breast parenchyma. Lymph nodes: # examined: 2. Lymph nodes with metastasis: 0. Breast prognostic profile: Performed on previous case ([id number]) Estrogen receptor: 0%, negative. Progesterone receptor: 0%, negative. Her 2 neu: 3.09, amplified. Ki-67: 53%. Non-neoplastic breast: Fat necrosis present. TNM: pT1b, pN0, MX. Comments: An estrogen receptor and progesterone  receptor will be repeated on the current tumor and reported in an addendum. (RAH:gt, 10/01/11) 2. The left sentinel axillary lymph node #1 shows benign glandular epithelial inclusions. Some of the inclusions are ciliated. The nuclei are bland appearing and are not malignant. Smooth muscle myosin, p63 and calponin immunohistochemical stains are performed which do not show a myoepithelial layer. 2 of 4 FINAL for Roskelley,  Eather Colas [phone]) Microscopic Comment(continued) Although this is the case, the inclusions are benign and do not represent metastatic carcinoma. Both Dr. Frederica Kuster and Dr. Colonel Bald have seen the left sentinel axillary lymph node in consultation with agreement that the inclusions are benign and do not represent metastatic carcinoma. Zandra Abts MD Pathologist, Electronic Signature (Case signed 10/02/2011) Specimen Gross and Clinical Information Specimen(s) Obtained: 1. Breast, lumpectomy, Left 2. Lymph node, sentinel, biopsy, Left axillary#1 3. Breast, excision, Posterior margin 4. Lymph node, sentinel, biopsy, Left axillary #2 Specimen Clinical  RADIOGRAPHIC STUDIES:  ASSESSMENT: 66 year old with   1. 0.7 cm high grade invasive ductal carcinoma that is ER-, PR- Her2Neu +, sentinel node negative (T1bN0) pathologic stage I.Because patient is HER-2 positive she is receiving adjuvant chemotherapy and Herceptin. She is going to be receiving 4 cycles of TCH combination and then Herceptin alone to complete out 1years worth of treatment.  #2Peripheral paresthesias secondary to Taxotere.  #3 anemia due to chemotherapy.  #4 nausea vomiting secondary to chemotherapy.    PLAN:   #1 Proceed with Herceptin today as she is scheduled.  #2 we will monitor the anemia I do not think at this point she needs a transfusion since she is asymptomatic.  #3Pain management: Patient is on hydrocodone on a as needed basis  #4 peripheral paresthesias a prescription for Neurontin 100 mg  twice a day was given.   #5 patient will return in one week's time for Taxotere carboplatinum and Herceptin .   All questions were answered. The patient knows to call the clinic with any problems, questions or concerns. We can certainly see the patient much sooner if necessary.  I spent 25 minutes counseling the patient face to face. The total time spent in the appointment was 30 minutes.    Drue Second, MD Medical/Oncology [facility] [phone] (beeper) [phone] (Office)  01/05/2012, 1:08 PM

## 2012-01-05 NOTE — Progress Notes (Signed)
Saw patient today in infusion who says she is doing better. Is managing symptoms through use of rx medication.  Reports tolerating foods and liquids. Appointment calendar reviewed and given. Relates hours of infusion 01/13/12-appt has been changed today.  No other concerns voiced.  All questions answered.  She understands to call with questions.

## 2012-01-05 NOTE — Patient Instructions (Signed)
[facility] Discharge Instructions for Patients Receiving Chemotherapy  Today you received the following chemotherapy agents Herceptin To help prevent nausea and vomiting after your treatment, we encourage you to take your nausea medication as prescribed.  If you develop nausea and vomiting that is not controlled by your nausea medication, call the clinic. If it is after clinic hours your family physician or the after hours number for the clinic or go to the Emergency Department.   BELOW ARE SYMPTOMS THAT SHOULD BE REPORTED IMMEDIATELY:  *FEVER GREATER THAN 100.5 F  *CHILLS WITH OR WITHOUT FEVER  NAUSEA AND VOMITING THAT IS NOT CONTROLLED WITH YOUR NAUSEA MEDICATION  *UNUSUAL SHORTNESS OF BREATH  *UNUSUAL BRUISING OR BLEEDING  TENDERNESS IN MOUTH AND THROAT WITH OR WITHOUT PRESENCE OF ULCERS  *URINARY PROBLEMS  *BOWEL PROBLEMS  UNUSUAL RASH Items with * indicate a potential emergency and should be followed up as soon as possible.  One of the nurses will contact you 24 hours after your treatment. Please let the nurse know about any problems that you may have experienced. Feel free to call the clinic you have any questions or concerns. The clinic phone number is [phone].   I have been informed and understand all the instructions given to me. I know to contact the clinic, my physician, or go to the Emergency Department if any problems should occur. I do not have any questions at this time, but understand that I may call the clinic during office hours   should I have any questions or need assistance in obtaining follow up care.    __________________________________________  _____________  __________ Signature of Patient or Authorized Representative            Date                   Time    __________________________________________ Nurse's Signature    

## 2012-01-06 ENCOUNTER — Other Ambulatory Visit: Payer: Self-pay | Admitting: *Deleted

## 2012-01-06 ENCOUNTER — Ambulatory Visit: Payer: Medicare Other

## 2012-01-06 MED ORDER — POTASSIUM CHLORIDE CRYS ER 20 MEQ PO TBCR: 20.0000 meq | EXTENDED_RELEASE_TABLET | Freq: Two times a day (BID) | ORAL | Status: DC

## 2012-01-06 NOTE — Telephone Encounter (Signed)
Message copied by Cooper Render on Wed Jan 06, 2012  5:21 PM ------      Message from: Victorino December      Created: Wed Jan 06, 2012  9:19 AM       Call patient: K-dur 20 meq po BID x 5 days

## 2012-01-06 NOTE — Telephone Encounter (Signed)
Per MD, notified pt to start K-dur PO BID. Pt verbalized understanding.

## 2012-01-07 ENCOUNTER — Encounter: Payer: Self-pay | Admitting: *Deleted

## 2012-01-08 ENCOUNTER — Ambulatory Visit
Admission: RE | Admit: 2012-01-08 | Discharge: 2012-01-08 | Disposition: A | Discharge status: Home / Self Care | Payer: Medicare Other | Source: Ambulatory Visit | Attending: Radiation Oncology | Admitting: Radiation Oncology

## 2012-01-08 ENCOUNTER — Encounter: Payer: Self-pay | Admitting: Radiation Oncology

## 2012-01-08 VITALS — BP 115/81 | HR 91 | Temp 98.3°F | Resp 20 | Wt 218.9 lb

## 2012-01-08 DIAGNOSIS — Z79899 Other long term (current) drug therapy: Secondary | ICD-10-CM | POA: Insufficient documentation

## 2012-01-08 DIAGNOSIS — Z510 Encounter for antineoplastic radiation therapy: Secondary | ICD-10-CM | POA: Insufficient documentation

## 2012-01-08 DIAGNOSIS — C50319 Malignant neoplasm of lower-inner quadrant of unspecified female breast: Secondary | ICD-10-CM

## 2012-01-08 DIAGNOSIS — C50919 Malignant neoplasm of unspecified site of unspecified female breast: Secondary | ICD-10-CM

## 2012-01-08 NOTE — Progress Notes (Signed)
Pt has pain in legs, states it started since she began chemo.  Percocet prn and rest give good relief. She also has intermittent swelling, numbness, tingling in hands, feet, tearing of eyes as well as loss of appetite, and fatigue.

## 2012-01-08 NOTE — Progress Notes (Signed)
Radiation Oncology         [phone] ________________________________  Name: [name] MRN: [id number]  Date: 01/08/2012  DOB: 09-23-1945  Follow-Up Visit Note  CC: [name], MD  Victorino December, MD  Diagnosis:   Invasive ductal carcinoma of the left breast, ER negative, PR negative, HER-2/neu positive  Interval Since Last Radiation:  Not applicable   Narrative:  The patient returns today for routine follow-up.  She completed lumpectomy and sentinel lymph node evaluation on 09/29/2011. This revealed a 0.7 cm invasive ductal carcinoma. Margins are negative. 2 sentinel lymph nodes negative for carcinoma. The tumor is ER negative, PR negative, and HER-2/neu positive. She has proceeded with adjuvant chemotherapy through Dr. Park Breed. She finishes her chemotherapy next week. Overall the patient states that she feels that she has done reasonably well with her chemotherapy. Some nausea on occasion.                              ALLERGIES:  is allergic to aspirin; iodine; and morphine and related.  Meds: Current Outpatient Prescriptions  Medication Sig Dispense Refill  . Calcium Carbonate-Vitamin D (CALCIUM-VITAMIN D) 600-200 MG-UNIT CAPS Take 1 capsule by mouth daily.        . cyanocobalamin (,VITAMIN B-12,) 1000 MCG/ML injection Inject 1,000 mcg into the muscle every 30 (thirty) days. Next one due the 26th of this month      . cyclobenzaprine (FLEXERIL) 5 MG tablet Take 5 mg by mouth 3 (three) times daily as needed.      Marland Kitchen dexamethasone (DECADRON) 4 MG tablet TAKE 2 TABLETS TWICE DAILY DAY PRIOR TO TREATMENT, DAY OF AND DAY AFTER  30 tablet  2  . gabapentin (NEURONTIN) 100 MG capsule Take 1 capsule (100 mg total) by mouth 2 times daily at 12 noon and 4 pm.  60 capsule  3  . levothyroxine (SYNTHROID, LEVOTHROID) 125 MCG tablet 1 TAB ONCE DAILY  30 tablet  5  . lidocaine-prilocaine (EMLA) cream Apply topically as needed.  30 g  0  . LORazepam (ATIVAN) 0.5 MG tablet Take 1 tablet (0.5  mg total) by mouth every 6 (six) hours as needed (Nausea or vomiting).  30 tablet  0  . lovastatin (MEVACOR) 20 MG tablet Take 20 mg by mouth at bedtime.      . magnesium chloride (SLOW-MAG) 64 MG TBEC Take 1 tablet by mouth daily.       Marland Kitchen omeprazole (PRILOSEC) 40 MG capsule Take 1 capsule (40 mg total) by mouth daily.  30 capsule  6  . ondansetron (ZOFRAN) 8 MG tablet Take 1 tablet two times a day starting the day after chemo for 3 days. Then take 1 tab two times a day as needed for nausea or vomiting.  30 tablet  1  . oxyCODONE-acetaminophen (PERCOCET/ROXICET) 5-325 MG per tablet Take 1 tablet by mouth every 6 (six) hours as needed. For pain  60 tablet  0  . potassium chloride SA (KLOR-CON M20) 20 MEQ tablet Take 1 tablet (20 mEq total) by mouth 2 (two) times daily.  10 tablet  0  . prochlorperazine (COMPAZINE) 10 MG tablet Take 10 mg by mouth every 6 (six) hours as needed.      . prochlorperazine (COMPAZINE) 25 MG suppository Place 25 mg rectally every 12 (twelve) hours as needed.      . promethazine (PHENERGAN) 25 MG tablet Take 1 tablet (25 mg total) by mouth  every 8 (eight) hours as needed. For nausea  30 tablet  0  . thiamine 100 MG tablet Take 100 mg by mouth daily.        . trazodone (DESYREL) 300 MG tablet Take 300 mg by mouth at bedtime.        Physical Findings: The patient is in no acute distress. Patient is alert and oriented.  weight is 218 lb 14.4 oz (99.292 kg). Her oral temperature is 98.3 F (36.8 C). Her blood pressure is 115/81 and her pulse is 91. Her respiration is 20. Marland Kitchen   General: Well-developed, in no acute distress HEENT: Normocephalic, atraumatic Cardiovascular: Regular rate and rhythm Respiratory: Clear to auscultation bilaterally Breast incision well-healed within the left breast GI: Soft, nontender, normal bowel sounds Extremities: No edema present   Lab Findings: Lab Results  Component Value Date   WBC 10.7* 01/05/2012   HGB 8.3* 01/05/2012   HCT 25.4*  01/05/2012   MCV 89.8 01/05/2012   PLT 118* 01/05/2012     Radiographic Findings: No results found.  Impression/Plan:    Pleasant 66 year old female who is nearing the completion of her course of adjuvant chemotherapy. She will be suitable to proceed with radiotherapy in several weeks.  I once again discussed with her the rationale of radiation treatment in this setting. We also discussed the possible side effects and risks of treatment. I would anticipate a 6-1/2 week course of radiation. We did also discuss the possible use of a breath hold technique for her treatment.   All of her questions were answered and she does wish to proceed with simulation.   I spent 30 minutes with the patient today, the majority of which was spent counseling the patient on the diagnosis of cancer and coordinating care.   Radene Gunning, M.D., Ph.D.

## 2012-01-08 NOTE — Progress Notes (Signed)
Please see the Nurse Progress Note in the MD Initial Consult Encounter for this patient. 

## 2012-01-08 NOTE — Addendum Note (Signed)
Encounter addended by: Jonna Coup, MD on: 01/08/2012 11:31 PM<BR>     Documentation filed: Notes Section

## 2012-01-13 ENCOUNTER — Telehealth: Payer: Self-pay | Admitting: *Deleted

## 2012-01-13 ENCOUNTER — Other Ambulatory Visit (HOSPITAL_BASED_OUTPATIENT_CLINIC_OR_DEPARTMENT_OTHER): Payer: Medicare Other | Admitting: Lab

## 2012-01-13 ENCOUNTER — Encounter: Payer: Self-pay | Admitting: Oncology

## 2012-01-13 ENCOUNTER — Ambulatory Visit: Payer: Medicare Other

## 2012-01-13 ENCOUNTER — Ambulatory Visit (HOSPITAL_BASED_OUTPATIENT_CLINIC_OR_DEPARTMENT_OTHER): Payer: Medicare Other

## 2012-01-13 ENCOUNTER — Ambulatory Visit (HOSPITAL_BASED_OUTPATIENT_CLINIC_OR_DEPARTMENT_OTHER): Payer: Medicare Other | Admitting: Oncology

## 2012-01-13 VITALS — BP 129/84 | HR 120 | Temp 98.8°F | Resp 20 | Ht 65.0 in | Wt 214.5 lb

## 2012-01-13 DIAGNOSIS — C50319 Malignant neoplasm of lower-inner quadrant of unspecified female breast: Secondary | ICD-10-CM

## 2012-01-13 DIAGNOSIS — Z5111 Encounter for antineoplastic chemotherapy: Secondary | ICD-10-CM

## 2012-01-13 DIAGNOSIS — R110 Nausea: Secondary | ICD-10-CM

## 2012-01-13 DIAGNOSIS — D6481 Anemia due to antineoplastic chemotherapy: Secondary | ICD-10-CM

## 2012-01-13 DIAGNOSIS — Z170 Estrogen receptor positive status [ER+]: Secondary | ICD-10-CM

## 2012-01-13 DIAGNOSIS — Z5112 Encounter for antineoplastic immunotherapy: Secondary | ICD-10-CM

## 2012-01-13 LAB — CBC WITH DIFFERENTIAL/PLATELET
BASO%: 0.2 % (ref 0.0–2.0)
EOS%: 0.1 % (ref 0.0–7.0)
LYMPH%: 12 % — ABNORMAL LOW (ref 14.0–49.7)
MCH: 29.9 pg (ref 25.1–34.0)
MCHC: 32.3 g/dL (ref 31.5–36.0)
MCV: 92.4 fL (ref 79.5–101.0)
MONO%: 10.2 % (ref 0.0–14.0)
Platelets: 212 10*3/uL (ref 145–400)
RBC: 2.91 10*6/uL — ABNORMAL LOW (ref 3.70–5.45)
nRBC: 0 % (ref 0–0)

## 2012-01-13 LAB — COMPREHENSIVE METABOLIC PANEL
ALT: 15 U/L (ref 0–35)
AST: 23 U/L (ref 0–37)
Alkaline Phosphatase: 76 U/L (ref 39–117)
CO2: 23 mEq/L (ref 19–32)
Creatinine, Ser: 1 mg/dL (ref 0.50–1.10)
Total Bilirubin: 1.4 mg/dL — ABNORMAL HIGH (ref 0.3–1.2)

## 2012-01-13 MED ORDER — DOCETAXEL CHEMO INJECTION 160 MG/16ML
50.0000 mg/m2 | Freq: Once | INTRAVENOUS | Status: AC
  Administered 2012-01-13: 110 mg via INTRAVENOUS
  Filled 2012-01-13: qty 11

## 2012-01-13 MED ORDER — ONDANSETRON 16 MG/50ML IVPB (CHCC)
16.0000 mg | Freq: Once | INTRAVENOUS | Status: AC
  Administered 2012-01-13: 16 mg via INTRAVENOUS

## 2012-01-13 MED ORDER — SODIUM CHLORIDE 0.9 % IJ SOLN
10.0000 mL | INTRAMUSCULAR | Status: DC | PRN
  Administered 2012-01-13: 10 mL
  Filled 2012-01-13: qty 10

## 2012-01-13 MED ORDER — ACETAMINOPHEN 325 MG PO TABS
650.0000 mg | ORAL_TABLET | Freq: Once | ORAL | Status: AC
  Administered 2012-01-13: 650 mg via ORAL

## 2012-01-13 MED ORDER — DIPHENHYDRAMINE HCL 25 MG PO CAPS
50.0000 mg | ORAL_CAPSULE | Freq: Once | ORAL | Status: AC
  Administered 2012-01-13: 50 mg via ORAL

## 2012-01-13 MED ORDER — SODIUM CHLORIDE 0.9 % IV SOLN
360.0000 mg | Freq: Once | INTRAVENOUS | Status: AC
  Administered 2012-01-13: 360 mg via INTRAVENOUS
  Filled 2012-01-13: qty 36

## 2012-01-13 MED ORDER — HEPARIN SOD (PORK) LOCK FLUSH 100 UNIT/ML IV SOLN
500.0000 [IU] | Freq: Once | INTRAVENOUS | Status: AC | PRN
  Administered 2012-01-13: 500 [IU]
  Filled 2012-01-13: qty 5

## 2012-01-13 MED ORDER — DEXAMETHASONE SODIUM PHOSPHATE 4 MG/ML IJ SOLN
20.0000 mg | Freq: Once | INTRAMUSCULAR | Status: AC
  Administered 2012-01-13: 20 mg via INTRAVENOUS

## 2012-01-13 MED ORDER — SODIUM CHLORIDE 0.9 % IV SOLN
Freq: Once | INTRAVENOUS | Status: AC
  Administered 2012-01-13: 12:00:00 via INTRAVENOUS

## 2012-01-13 MED ORDER — TRASTUZUMAB CHEMO INJECTION 440 MG
2.0000 mg/kg | Freq: Once | INTRAVENOUS | Status: AC
  Administered 2012-01-13: 189 mg via INTRAVENOUS
  Filled 2012-01-13: qty 9

## 2012-01-13 MED ORDER — TRAMADOL HCL 50 MG PO TABS: 50.0000 mg | ORAL_TABLET | Freq: Two times a day (BID) | ORAL | Status: AC

## 2012-01-13 NOTE — Patient Instructions (Signed)
[facility] Discharge Instructions for Patients Receiving Chemotherapy  Today you received the following chemotherapy agents Taxotere, Carboplatin and Herceptin.  To help prevent nausea and vomiting after your treatment, we encourage you to take your nausea medication. Begin taking it as often as prescribed for by Dr. Welton Flakes.    If you develop nausea and vomiting that is not controlled by your nausea medication, call the clinic. If it is after clinic hours your family physician or the after hours number for the clinic or go to the Emergency Department.   BELOW ARE SYMPTOMS THAT SHOULD BE REPORTED IMMEDIATELY:  *FEVER GREATER THAN 100.5 F  *CHILLS WITH OR WITHOUT FEVER  NAUSEA AND VOMITING THAT IS NOT CONTROLLED WITH YOUR NAUSEA MEDICATION  *UNUSUAL SHORTNESS OF BREATH  *UNUSUAL BRUISING OR BLEEDING  TENDERNESS IN MOUTH AND THROAT WITH OR WITHOUT PRESENCE OF ULCERS  *URINARY PROBLEMS  *BOWEL PROBLEMS  UNUSUAL RASH Items with * indicate a potential emergency and should be followed up as soon as possible.  One of the nurses will contact you 24 hours after your treatment. Please let the nurse know about any problems that you may have experienced. Feel free to call the clinic you have any questions or concerns. The clinic phone number is [phone].   I have been informed and understand all the instructions given to me. I know to contact the clinic, my physician, or go to the Emergency Department if any problems should occur. I do not have any questions at this time, but understand that I may call the clinic during office hours   should I have any questions or need assistance in obtaining follow up care.    __________________________________________  _____________  __________ Signature of Patient or Authorized Representative            Date                   Time    __________________________________________ Nurse's Signature

## 2012-01-13 NOTE — Progress Notes (Signed)
OFFICE PROGRESS NOTE  CC  [name], MD [address] [facility] 4th [address] [place] [address] Dr. Claud Kelp  DIAGNOSIS: 86 year ol female with new diagnosis of stage I ER-/PR-/Her2Neu positive invasive ductal breast cancer  PRIOR THERAPY: 1. 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 ER negative PR negative Her2 Neu positive with Ki -67 53%,   2. S/P porta cath palcement for chemotherapy  #3 patient has begun her adjuvant chemotherapy consisting of Taxotere carboplatinum and Herceptin. Her treatment began in May 2013. A total of 4 cycles of Taxotere carboplatinum and Herceptin combination are planned. Once she completes this she will then proceed to radiation therapy with concomitant Herceptin to be given every 3 weeks to finish out a year of treatment.  CURRENT THERAPY: Patient is here for Taxotere carboplatinum and Herceptin .  INTERVAL HISTORY: [name] 66 y.o. female returns for follow up. Overall she tells me she feels well.She is a little fatigued but otherwise no other complaints. She has Minor peripheral paresthesias,  no nausea or vomiting currently but she does develop it and she does have antiemetics prescribed. Patient has significant left knee pain. She has been taking a bit of narcotics for this. She has received steroid injections in the past by her surgeon Dr. Eulah Pont. I have recommended that she be seen by him again in the next week or so for consideration of intra-articular injections of steroids to relieve his pain. She certainly will eventually need left knee replacement. Patient was also seen by radiation oncology and she is plan to start radiation towards the end of this month.She has no shortness of breath no cough no bleeding. Remainder of the 10 point review of systems is negative. MEDICAL HISTORY: Past Medical History  Diagnosis Date  . Obesity   . PUD (peptic ulcer  disease)   . Leukopenia   . Abdominal pain   . Short bowel syndrome   . Internal hemorrhoids without mention of complication   . Stricture and stenosis of esophagus   . Osteoarthrosis, unspecified whether generalized or localized, unspecified site   . Thyrotoxicosis without mention of goiter or other cause, without mention of thyrotoxic crisis or storm   . Other and unspecified hyperlipidemia   . Gout, unspecified   . Diverticulosis of colon (without mention of hemorrhage)   . C. difficile colitis   . VITAMIN B12 DEFICIENCY 08/30/2009  . GOITER, MULTINODULAR 04/02/2009  . HYPOTHYROIDISM, POST-RADIATION 08/13/2009  . ASYMPTOMATIC POSTMENOPAUSAL STATUS 10/11/2008  . Esophageal reflux 06/12/2008  . PONV (postoperative nausea and vomiting)   . Varicose veins   . Type II or unspecified type diabetes mellitus without mention of complication, not stated as uncontrolled     no med in years diet controled  . Blood transfusion   . UTI (urinary tract infection)   . Kidney stones     "several"  . Unspecified essential hypertension   . Angina   . Shortness of breath on exertion     "sometimes"  . Anemia   . ANEMIA, IRON DEFICIENCY 05/08/2009  . History of lower GI bleeding   . H/O hiatal hernia   . Migraines   . Breast cancer 09/29/11    invasive grade III ductal ca,assoc high grade dcis,ER/PR=neg    ALLERGIES:  is allergic to aspirin; iodine; and morphine and related.  MEDICATIONS:  Current Outpatient Prescriptions  Medication Sig Dispense Refill  .  Calcium Carbonate-Vitamin D (CALCIUM-VITAMIN D) 600-200 MG-UNIT CAPS Take 1 capsule by mouth daily.        . cyanocobalamin (,VITAMIN B-12,) 1000 MCG/ML injection Inject 1,000 mcg into the muscle every 30 (thirty) days. Next one due the 26th of this month      . cyclobenzaprine (FLEXERIL) 5 MG tablet Take 5 mg by mouth 3 (three) times daily as needed.      Marland Kitchen dexamethasone (DECADRON) 4 MG tablet TAKE 2 TABLETS TWICE DAILY DAY PRIOR TO TREATMENT,  DAY OF AND DAY AFTER  30 tablet  2  . gabapentin (NEURONTIN) 100 MG capsule Take 1 capsule (100 mg total) by mouth 2 times daily at 12 noon and 4 pm.  60 capsule  3  . levothyroxine (SYNTHROID, LEVOTHROID) 125 MCG tablet 1 TAB ONCE DAILY  30 tablet  5  . lidocaine-prilocaine (EMLA) cream Apply topically as needed.  30 g  0  . LORazepam (ATIVAN) 0.5 MG tablet Take 1 tablet (0.5 mg total) by mouth every 6 (six) hours as needed (Nausea or vomiting).  30 tablet  0  . lovastatin (MEVACOR) 20 MG tablet Take 20 mg by mouth at bedtime.      . magnesium chloride (SLOW-MAG) 64 MG TBEC Take 1 tablet by mouth daily.       Marland Kitchen omeprazole (PRILOSEC) 40 MG capsule Take 1 capsule (40 mg total) by mouth daily.  30 capsule  6  . ondansetron (ZOFRAN) 8 MG tablet Take 1 tablet two times a day starting the day after chemo for 3 days. Then take 1 tab two times a day as needed for nausea or vomiting.  30 tablet  1  . oxyCODONE-acetaminophen (PERCOCET/ROXICET) 5-325 MG per tablet Take 1 tablet by mouth every 6 (six) hours as needed. For pain  60 tablet  0  . potassium chloride SA (KLOR-CON M20) 20 MEQ tablet Take 1 tablet (20 mEq total) by mouth 2 (two) times daily.  10 tablet  0  . prochlorperazine (COMPAZINE) 10 MG tablet Take 10 mg by mouth every 6 (six) hours as needed.      . prochlorperazine (COMPAZINE) 25 MG suppository Place 25 mg rectally every 12 (twelve) hours as needed.      . promethazine (PHENERGAN) 25 MG tablet Take 1 tablet (25 mg total) by mouth every 8 (eight) hours as needed. For nausea  30 tablet  0  . thiamine 100 MG tablet Take 100 mg by mouth daily.        . trazodone (DESYREL) 300 MG tablet Take 300 mg by mouth at bedtime.      . traMADol (ULTRAM) 50 MG tablet Take 1 tablet (50 mg total) by mouth 2 (two) times daily.  60 tablet  0    SURGICAL HISTORY:  Past Surgical History  Procedure Date  . Vein ligation and stripping 1980's    Right leg  . Lithotripsy     "4 or 5 times"  . Bunionectomy  1970's    bilateral  . Colonoscopy   . Esophagogastroduodenoscopy 07/15/2005  . Thyroid ultrasound 12/1994 and 12/1995  . Mastectomy w/ nodes partial 09/29/11    left  . Port a cath placement 09/29/11    right chest  . Breast surgery   . Cholecystectomy 1990's  . Abdominal hysterectomy 1970's    with BSO  . Dilation and curettage of uterus   . Colon surgery     "several surgeries for short bowel syndrome"  . Abdominal adhesion surgery 1980's  thru 1990's    "several"  . Kidney stone surgery 1990's    "tried to go up & get it but pushed it further up"  . Portacath placement 09/29/2011    Procedure: INSERTION PORT-A-CATH;  Surgeon: Ernestene Mention, MD;  Location: [facility] OR;  Service: General;  Laterality: N/A;  . Breast biopsy 08/13/11    left breast lower inner quadrant  . Breast lumpectomy w/ needle localization 09/29/11    left  breast=lymph node,excision benign/ ER/PR=neg, her 2 Positive    REVIEW OF SYSTEMS:  Pertinent items are noted in HPI.   PHYSICAL EXAMINATION: General appearance: alert, cooperative and appears stated age Resp: clear to auscultation bilaterally and normal percussion bilaterally Cardio: regular rate and rhythm, S1, S2 normal, no murmur, click, rub or gallop GI: soft, non-tender; bowel sounds normal; no masses,  no organomegaly Extremities: extremities normal, atraumatic, no cyanosis or edema Neurologic: Grossly normal Bilateral Breast Exam: left breast healing incision scar, with some tenderness, no evidence of infections.Right breast no masses or nipple discharge ECOG PERFORMANCE STATUS: 1 - Symptomatic but completely ambulatory  Blood pressure 129/84, pulse 120, temperature 98.8 F (37.1 C), temperature source Oral, resp. rate 20, height 5\' 5"  (1.651 m), weight 214 lb 8 oz (97.297 kg).  LABORATORY DATA: Lab Results  Component Value Date   WBC 13.1* 01/13/2012   HGB 8.7* 01/13/2012   HCT 26.9* 01/13/2012   MCV 92.4 01/13/2012   PLT 212 01/13/2012       Chemistry      Component Value Date/Time   NA 143 01/05/2012 1200   K 3.3* 01/05/2012 1200   CL 104 01/05/2012 1200   CO2 25 01/05/2012 1200   BUN 16 01/05/2012 1200   CREATININE 0.92 01/05/2012 1200   CREATININE 0.88 03/31/2011 1550      Component Value Date/Time   CALCIUM 6.9* 01/05/2012 1200   CALCIUM 9.5 05/15/2010 2153   ALKPHOS 76 01/05/2012 1200   AST 27 01/05/2012 1200   ALT 20 01/05/2012 1200   BILITOT 1.0 01/05/2012 1200     ADDITIONAL INFORMATION: 1. PROGNOSTIC INDICATORS - ACIS Results IMMUNOHISTOCHEMICAL AND MORPHOMETRIC ANALYSIS BY THE AUTOMATED CELLULAR IMAGING SYSTEM (ACIS) Estrogen Receptor (Negative, <1%): 0%, NEGATIVE Progesterone Receptor (Negative, <1%): 0%, NEGATIVE COMMENT: The negative hormone receptor study(ies) in this case have an internal positive control. All controls stained appropriately Abigail Miyamoto MD Pathologist, Electronic Signature ( Signed 10/07/2011) FINAL DIAGNOSIS 1 of 4 FINAL for Mixon, Orville O ([id number]) Diagnosis 1. Breast, lumpectomy, Left - INVASIVE GRADE III, DUCTAL CARCINOMA, SPANNING 0.7 CM. - ASSOCIATED HIGH GRADED DUCTAL CARCINOMA IN SITU. - LYMPH/VASCULAR INVASION NOT IDENTIFIED. - MARGINS ARE NEGATIVE. - SEE ONCOLOGY TEMPLATE. 2. Lymph node, sentinel, biopsy, Left axillary#1 - ONE BENIGN LYMPH NODE WITH NO TUMOR (0/1). - BENIGN GLANDULAR EPITHELIAL INCLUSIONS PRESENT. - SEE COMMENT. 3. Breast, excision, Posterior margin - BENIGN BREAST PARENCHYMA. - NO ATYPIA, HYPERPLASIA, OR MALIGNANCY IDENTIFIED. 4. Lymph node, sentinel, biopsy, Left axillary #2 - ONE BENIGN LYMPH NODE WITH NO TUMOR SEEN (0/1). Microscopic Comment 1. BREAST, INVASIVE TUMOR, WITH LYMPH NODE SAMPLING Specimen, including laterality: Left breast with posterior margin and sentinel lymph nodes. Procedure: Left breast lumpectomy with posterior margin excision and sentinel lymph node biopsies. Grade: III. Tubule formation: 3. Nuclear pleomorphism:  3. Mitotic:3. Tumor size (gross measurement and glass slide measurement): 0.7 cm. Margins: Invasive, distance to closest margin: At least 0.8 cm. In-situ, distance to closest margin: At least 0.8 cm. Lymphovascular invasion: Not identified. Ductal carcinoma in  situ: Yes. Grade: High grade. Extensive intraductal component: No. Lobular neoplasia: No. Tumor focality: Unifocal. Treatment effect: N/A. Extent of tumor: Confined to breast parenchyma. Lymph nodes: # examined: 2. Lymph nodes with metastasis: 0. Breast prognostic profile: Performed on previous case ([id number]) Estrogen receptor: 0%, negative. Progesterone receptor: 0%, negative. Her 2 neu: 3.09, amplified. Ki-67: 53%. Non-neoplastic breast: Fat necrosis present. TNM: pT1b, pN0, MX. Comments: An estrogen receptor and progesterone receptor will be repeated on the current tumor and reported in an addendum. (RAH:gt, 10/01/11) 2. The left sentinel axillary lymph node #1 shows benign glandular epithelial inclusions. Some of the inclusions are ciliated. The nuclei are bland appearing and are not malignant. Smooth muscle myosin, p63 and calponin immunohistochemical stains are performed which do not show a myoepithelial layer. 2 of 4 FINAL for Pau, WANDRA BABIN ([id number]) Microscopic Comment(continued) Although this is the case, the inclusions are benign and do not represent metastatic carcinoma. Both Dr. Frederica Kuster and Dr. Colonel Bald have seen the left sentinel axillary lymph node in consultation with agreement that the inclusions are benign and do not represent metastatic carcinoma. Zandra Abts MD Pathologist, Electronic Signature (Case signed 10/02/2011) Specimen Gross and Clinical Information Specimen(s) Obtained: 1. Breast, lumpectomy, Left 2. Lymph node, sentinel, biopsy, Left axillary#1 3. Breast, excision, Posterior margin 4. Lymph node, sentinel, biopsy, Left axillary #2 Specimen Clinical  RADIOGRAPHIC  STUDIES:  ASSESSMENT: 66 year old with   1. 0.7 cm high grade invasive ductal carcinoma that is ER-, PR- Her2Neu +, sentinel node negative (T1bN0) pathologic stage I.Because patient is HER-2 positive she is receiving adjuvant chemotherapy and Herceptin. She is going to be receiving 4 cycles of TCH combination and then Herceptin alone to complete out 1years worth of treatment.  #2Peripheral paresthesias secondary to Taxotere.  #3 anemia due to chemotherapy.  #4 nausea vomiting secondary to chemotherapy.    PLAN:   #1 Proceed with cycle 4 of Taxotere carboplatinum and Herceptin. This is her final cycle.  #2 she will return in one week's time for interim followup.  #3 for her left knee pain I have recommended that patient be seen by her orthopedic surgeon for consideration of hydrocortisone injections. She has had these in the past and tells me that it significantly improved her pain in the left knee. She realizes that she will eventually need a left knee replacement but this certainly can be done while she is getting Herceptin and has completed her radiation.  #4 patient was given a prescription for tramadol 50 mg every 12 hours as needed for pain. I do not plan on giving her any more hydrocodone or Phenergan.  All questions were answered. The patient knows to call the clinic with any problems, questions or concerns. We can certainly see the patient much sooner if necessary.  I spent 25 minutes counseling the patient face to face. The total time spent in the appointment was 30 minutes.    Drue Second, MD Medical/Oncology [facility] [phone] (beeper) [phone] (Office)  01/13/2012, 12:14 PM

## 2012-01-13 NOTE — Telephone Encounter (Signed)
Nothing to schedule has of 01-13-2012

## 2012-01-13 NOTE — Patient Instructions (Addendum)
Proceed with cycle 4 of chemotherapy today [facility])  Return 8/15 for neulasta injection  I will see you back in 1 week for visit and labs

## 2012-01-14 ENCOUNTER — Ambulatory Visit (HOSPITAL_BASED_OUTPATIENT_CLINIC_OR_DEPARTMENT_OTHER): Payer: Medicare Other

## 2012-01-14 ENCOUNTER — Other Ambulatory Visit: Payer: Self-pay | Admitting: Medical Oncology

## 2012-01-14 ENCOUNTER — Other Ambulatory Visit: Payer: Self-pay | Admitting: Internal Medicine

## 2012-01-14 VITALS — BP 123/87 | HR 99 | Temp 97.6°F

## 2012-01-14 DIAGNOSIS — C50319 Malignant neoplasm of lower-inner quadrant of unspecified female breast: Secondary | ICD-10-CM

## 2012-01-14 DIAGNOSIS — Z170 Estrogen receptor positive status [ER+]: Secondary | ICD-10-CM

## 2012-01-14 MED ORDER — PEGFILGRASTIM INJECTION 6 MG/0.6ML
6.0000 mg | Freq: Once | SUBCUTANEOUS | Status: AC
  Administered 2012-01-14: 6 mg via SUBCUTANEOUS
  Filled 2012-01-14: qty 0.6

## 2012-01-14 MED ORDER — CALCIUM GLUCONATE 650 MG PO TABS: 650.0000 mg | ORAL_TABLET | Freq: Every day | ORAL | Status: DC

## 2012-01-14 NOTE — Telephone Encounter (Signed)
Per MD, patient to begin taking calcium gluconate daily.  Sent prescription to pharmacy and notified patient.  Patient expressed understanding and confirmed instructions, no questions at this time.

## 2012-01-20 ENCOUNTER — Encounter: Payer: Self-pay | Admitting: Oncology

## 2012-01-20 ENCOUNTER — Ambulatory Visit (HOSPITAL_BASED_OUTPATIENT_CLINIC_OR_DEPARTMENT_OTHER): Payer: Medicare Other | Admitting: Oncology

## 2012-01-20 ENCOUNTER — Ambulatory Visit (HOSPITAL_BASED_OUTPATIENT_CLINIC_OR_DEPARTMENT_OTHER): Payer: Medicare Other

## 2012-01-20 ENCOUNTER — Encounter (HOSPITAL_COMMUNITY)
Admission: RE | Admit: 2012-01-20 | Discharge: 2012-01-20 | Disposition: A | Discharge status: Home / Self Care | Payer: Medicare Other | Source: Ambulatory Visit | Attending: Oncology | Admitting: Oncology

## 2012-01-20 ENCOUNTER — Other Ambulatory Visit: Payer: Self-pay | Admitting: *Deleted

## 2012-01-20 ENCOUNTER — Other Ambulatory Visit (HOSPITAL_BASED_OUTPATIENT_CLINIC_OR_DEPARTMENT_OTHER): Payer: Medicare Other | Admitting: Lab

## 2012-01-20 VITALS — BP 116/79 | HR 103 | Temp 98.3°F | Resp 20 | Ht 65.0 in | Wt 209.1 lb

## 2012-01-20 DIAGNOSIS — J40 Bronchitis, not specified as acute or chronic: Secondary | ICD-10-CM

## 2012-01-20 DIAGNOSIS — C50319 Malignant neoplasm of lower-inner quadrant of unspecified female breast: Secondary | ICD-10-CM

## 2012-01-20 DIAGNOSIS — D649 Anemia, unspecified: Secondary | ICD-10-CM | POA: Insufficient documentation

## 2012-01-20 DIAGNOSIS — E860 Dehydration: Secondary | ICD-10-CM

## 2012-01-20 DIAGNOSIS — Z5112 Encounter for antineoplastic immunotherapy: Secondary | ICD-10-CM

## 2012-01-20 DIAGNOSIS — D6481 Anemia due to antineoplastic chemotherapy: Secondary | ICD-10-CM

## 2012-01-20 DIAGNOSIS — Z171 Estrogen receptor negative status [ER-]: Secondary | ICD-10-CM

## 2012-01-20 LAB — CBC WITH DIFFERENTIAL/PLATELET
BASO%: 0.3 % (ref 0.0–2.0)
EOS%: 0.1 % (ref 0.0–7.0)
MCH: 30.2 pg (ref 25.1–34.0)
MCHC: 33.2 g/dL (ref 31.5–36.0)
MCV: 90.9 fL (ref 79.5–101.0)
MONO%: 17.7 % — ABNORMAL HIGH (ref 0.0–14.0)
RBC: 2.75 10*6/uL — ABNORMAL LOW (ref 3.70–5.45)
RDW: 15.6 % — ABNORMAL HIGH (ref 11.2–14.5)
lymph#: 1.9 10*3/uL (ref 0.9–3.3)

## 2012-01-20 LAB — COMPREHENSIVE METABOLIC PANEL
AST: 24 U/L (ref 0–37)
Albumin: 4 g/dL (ref 3.5–5.2)
Alkaline Phosphatase: 105 U/L (ref 39–117)
Glucose, Bld: 81 mg/dL (ref 70–99)
Potassium: 3 mEq/L — ABNORMAL LOW (ref 3.5–5.3)
Sodium: 141 mEq/L (ref 135–145)
Total Bilirubin: 0.8 mg/dL (ref 0.3–1.2)
Total Protein: 6.9 g/dL (ref 6.0–8.3)

## 2012-01-20 MED ORDER — SODIUM CHLORIDE 0.9 % IV SOLN
Freq: Once | INTRAVENOUS | Status: AC
  Administered 2012-01-20: 14:00:00 via INTRAVENOUS

## 2012-01-20 MED ORDER — ACETAMINOPHEN 325 MG PO TABS
650.0000 mg | ORAL_TABLET | Freq: Once | ORAL | Status: AC
  Administered 2012-01-20: 650 mg via ORAL

## 2012-01-20 MED ORDER — DIPHENHYDRAMINE HCL 25 MG PO CAPS
50.0000 mg | ORAL_CAPSULE | Freq: Once | ORAL | Status: AC
  Administered 2012-01-20: 50 mg via ORAL

## 2012-01-20 MED ORDER — AZITHROMYCIN 250 MG PO TABS: ORAL_TABLET | ORAL | Status: AC

## 2012-01-20 MED ORDER — TRASTUZUMAB CHEMO INJECTION 440 MG
6.0000 mg/kg | Freq: Once | INTRAVENOUS | Status: AC
  Administered 2012-01-20: 567 mg via INTRAVENOUS
  Filled 2012-01-20: qty 27

## 2012-01-20 MED ORDER — HEPARIN SOD (PORK) LOCK FLUSH 100 UNIT/ML IV SOLN
500.0000 [IU] | Freq: Once | INTRAVENOUS | Status: AC | PRN
  Administered 2012-01-20: 500 [IU]
  Filled 2012-01-20: qty 5

## 2012-01-20 MED ORDER — SODIUM CHLORIDE 0.9 % IJ SOLN
10.0000 mL | INTRAMUSCULAR | Status: DC | PRN
  Administered 2012-01-20: 10 mL
  Filled 2012-01-20: qty 10

## 2012-01-20 NOTE — Patient Instructions (Addendum)
[facility] Discharge Instructions for Patients Receiving Chemotherapy  Today you received the following chemotherapy agents :  Herceptin.  To help prevent nausea and vomiting after your treatment, we encourage you to take your nausea medication as instructed by your physician, and take meds as needed for nausea.    If you develop nausea and vomiting that is not controlled by your nausea medication, call the clinic. If it is after clinic hours your family physician or the after hours number for the clinic or go to the Emergency Department.   BELOW ARE SYMPTOMS THAT SHOULD BE REPORTED IMMEDIATELY:  *FEVER GREATER THAN 100.5 F  *CHILLS WITH OR WITHOUT FEVER  NAUSEA AND VOMITING THAT IS NOT CONTROLLED WITH YOUR NAUSEA MEDICATION  *UNUSUAL SHORTNESS OF BREATH  *UNUSUAL BRUISING OR BLEEDING  TENDERNESS IN MOUTH AND THROAT WITH OR WITHOUT PRESENCE OF ULCERS  *URINARY PROBLEMS  *BOWEL PROBLEMS  UNUSUAL RASH Items with * indicate a potential emergency and should be followed up as soon as possible.  One of the nurses will contact you 24 hours after your treatment. Please let the nurse know about any problems that you may have experienced. Feel free to call the clinic you have any questions or concerns. The clinic phone number is [phone].   I have been informed and understand all the instructions given to me. I know to contact the clinic, my physician, or go to the Emergency Department if any problems should occur. I do not have any questions at this time, but understand that I may call the clinic during office hours   should I have any questions or need assistance in obtaining follow up care.    __________________________________________  _____________  __________ Signature of Patient or Authorized Representative            Date                   Time    __________________________________________ Nurse's Signature    

## 2012-01-20 NOTE — Progress Notes (Signed)
OFFICE PROGRESS NOTE  CC  [name], MD [address] [facility] 4th Floor [place] [address] Dr. Claud Kelp  DIAGNOSIS: 26 year ol female with new diagnosis of stage I ER-/PR-/Her2Neu positive invasive ductal breast cancer  PRIOR THERAPY: 1. 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 ER negative PR negative Her2 Neu positive with Ki -67 53%,   2. S/P porta cath palcement for chemotherapy  #3 patient has begun her adjuvant chemotherapy consisting of Taxotere carboplatinum and Herceptin. Her treatment began in May 2013. A total of 4 cycles of Taxotere carboplatinum and Herceptin combination are planned. Once she completes this she will then proceed to radiation therapy with concomitant Herceptin to be given every 3 weeks to finish out a year of treatment.  CURRENT THERAPY: Patient is here for Taxotere carboplatinum and Herceptin .  INTERVAL HISTORY: [name] 66 y.o. female returns for follow up. Overall she tells me she feels well.She is a little fatigued but otherwise no other complaints. She has Minor peripheral paresthesias,  no nausea or vomiting currently but she does develop it and she does have antiemetics prescribed. Patient has significant left knee pain. She has been taking a bit of narcotics for this. She has received steroid injections in the past by her surgeon Dr. Eulah Pont. I have recommended that she be seen by him again in the next week or so for consideration of intra-articular injections of steroids to relieve his pain. She certainly will eventually need left knee replacement. Patient was also seen by radiation oncology and she is plan to start radiation towards the end of this month.She has no shortness of breath no cough no bleeding. Remainder of the 10 point review of systems is negative. MEDICAL HISTORY: Past Medical History  Diagnosis Date  . Obesity   . PUD (peptic ulcer  disease)   . Leukopenia   . Abdominal pain   . Short bowel syndrome   . Internal hemorrhoids without mention of complication   . Stricture and stenosis of esophagus   . Osteoarthrosis, unspecified whether generalized or localized, unspecified site   . Thyrotoxicosis without mention of goiter or other cause, without mention of thyrotoxic crisis or storm   . Other and unspecified hyperlipidemia   . Gout, unspecified   . Diverticulosis of colon (without mention of hemorrhage)   . C. difficile colitis   . VITAMIN B12 DEFICIENCY 08/30/2009  . GOITER, MULTINODULAR 04/02/2009  . HYPOTHYROIDISM, POST-RADIATION 08/13/2009  . ASYMPTOMATIC POSTMENOPAUSAL STATUS 10/11/2008  . Esophageal reflux 06/12/2008  . PONV (postoperative nausea and vomiting)   . Varicose veins   . Type II or unspecified type diabetes mellitus without mention of complication, not stated as uncontrolled     no med in years diet controled  . Blood transfusion   . UTI (urinary tract infection)   . Kidney stones     "several"  . Unspecified essential hypertension   . Angina   . Shortness of breath on exertion     "sometimes"  . Anemia   . ANEMIA, IRON DEFICIENCY 05/08/2009  . History of lower GI bleeding   . H/O hiatal hernia   . Migraines   . Breast cancer 09/29/11    invasive grade III ductal ca,assoc high grade dcis,ER/PR=neg    ALLERGIES:  is allergic to aspirin; iodine; and morphine and related.  MEDICATIONS:  Current Outpatient Prescriptions  Medication Sig Dispense Refill  .  Calcium Carbonate-Vitamin D (CALCIUM-VITAMIN D) 600-200 MG-UNIT CAPS Take 1 capsule by mouth daily.        . calcium gluconate 650 MG tablet Take 1 tablet (650 mg total) by mouth daily.  30 tablet  2  . cyanocobalamin (,VITAMIN B-12,) 1000 MCG/ML injection Inject 1,000 mcg into the muscle every 30 (thirty) days. Next one due the 26th of this month      . cyclobenzaprine (FLEXERIL) 5 MG tablet Take 5 mg by mouth 3 (three) times daily as needed.       Marland Kitchen dexamethasone (DECADRON) 4 MG tablet TAKE 2 TABLETS TWICE DAILY DAY PRIOR TO TREATMENT, DAY OF AND DAY AFTER  30 tablet  2  . gabapentin (NEURONTIN) 100 MG capsule Take 1 capsule (100 mg total) by mouth 2 times daily at 12 noon and 4 pm.  60 capsule  3  . levothyroxine (SYNTHROID, LEVOTHROID) 125 MCG tablet 1 TAB ONCE DAILY  30 tablet  5  . lidocaine-prilocaine (EMLA) cream Apply topically as needed.  30 g  0  . LORazepam (ATIVAN) 0.5 MG tablet Take 1 tablet (0.5 mg total) by mouth every 6 (six) hours as needed (Nausea or vomiting).  30 tablet  0  . lovastatin (MEVACOR) 20 MG tablet Take 20 mg by mouth at bedtime.      . magnesium chloride (SLOW-MAG) 64 MG TBEC Take 1 tablet by mouth daily.       Marland Kitchen omeprazole (PRILOSEC) 40 MG capsule Take 1 capsule (40 mg total) by mouth daily.  30 capsule  6  . ondansetron (ZOFRAN) 8 MG tablet Take 1 tablet two times a day starting the day after chemo for 3 days. Then take 1 tab two times a day as needed for nausea or vomiting.  30 tablet  1  . oxyCODONE-acetaminophen (PERCOCET/ROXICET) 5-325 MG per tablet Take 1 tablet by mouth every 6 (six) hours as needed. For pain  60 tablet  0  . potassium chloride SA (KLOR-CON M20) 20 MEQ tablet Take 1 tablet (20 mEq total) by mouth 2 (two) times daily.  10 tablet  0  . prochlorperazine (COMPAZINE) 10 MG tablet Take 10 mg by mouth every 6 (six) hours as needed.      . prochlorperazine (COMPAZINE) 25 MG suppository Place 25 mg rectally every 12 (twelve) hours as needed.      . promethazine (PHENERGAN) 25 MG tablet TAKE 1 TABLET BY MOUTH EVERY 8 HOURS AS NEEDED FOR NAUSEA  30 tablet  2  . thiamine 100 MG tablet Take 100 mg by mouth daily.        . traMADol (ULTRAM) 50 MG tablet Take 1 tablet (50 mg total) by mouth 2 (two) times daily.  60 tablet  0  . trazodone (DESYREL) 300 MG tablet Take 300 mg by mouth at bedtime.        SURGICAL HISTORY:  Past Surgical History  Procedure Date  . Vein ligation and stripping 1980's     Right leg  . Lithotripsy     "4 or 5 times"  . Bunionectomy 1970's    bilateral  . Colonoscopy   . Esophagogastroduodenoscopy 07/15/2005  . Thyroid ultrasound 12/1994 and 12/1995  . Mastectomy w/ nodes partial 09/29/11    left  . Port a cath placement 09/29/11    right chest  . Breast surgery   . Cholecystectomy 1990's  . Abdominal hysterectomy 1970's    with BSO  . Dilation and curettage of uterus   . Colon  surgery     "several surgeries for short bowel syndrome"  . Abdominal adhesion surgery 1980's thru 1990's    "several"  . Kidney stone surgery 1990's    "tried to go up & get it but pushed it further up"  . Portacath placement 09/29/2011    Procedure: INSERTION PORT-A-CATH;  Surgeon: Ernestene Mention, MD;  Location: [facility] OR;  Service: General;  Laterality: N/A;  . Breast biopsy 08/13/11    left breast lower inner quadrant  . Breast lumpectomy w/ needle localization 09/29/11    left  breast=lymph node,excision benign/ ER/PR=neg, her 2 Positive    REVIEW OF SYSTEMS:  Pertinent items are noted in HPI.   PHYSICAL EXAMINATION: General appearance: alert, cooperative and appears stated age Resp: clear to auscultation bilaterally and normal percussion bilaterally Cardio: regular rate and rhythm, S1, S2 normal, no murmur, click, rub or gallop GI: soft, non-tender; bowel sounds normal; no masses,  no organomegaly Extremities: extremities normal, atraumatic, no cyanosis or edema Neurologic: Grossly normal Bilateral Breast Exam: left breast healing incision scar, with some tenderness, no evidence of infections.Right breast no masses or nipple discharge ECOG PERFORMANCE STATUS: 1 - Symptomatic but completely ambulatory  Blood pressure 116/79, pulse 103, temperature 98.3 F (36.8 C), temperature source Oral, resp. rate 20, height 5\' 5"  (1.651 m), weight 209 lb 1.6 oz (94.847 kg).  LABORATORY DATA: Lab Results  Component Value Date   WBC 11.6* 01/20/2012   HGB 8.3* 01/20/2012   HCT  25.0* 01/20/2012   MCV 90.9 01/20/2012   PLT 234 01/20/2012      Chemistry      Component Value Date/Time   NA 141 01/13/2012 1044   K 3.4* 01/13/2012 1044   CL 102 01/13/2012 1044   CO2 23 01/13/2012 1044   BUN 12 01/13/2012 1044   CREATININE 1.00 01/13/2012 1044   CREATININE 0.88 03/31/2011 1550      Component Value Date/Time   CALCIUM 6.4* 01/13/2012 1044   CALCIUM 9.5 05/15/2010 2153   ALKPHOS 76 01/13/2012 1044   AST 23 01/13/2012 1044   ALT 15 01/13/2012 1044   BILITOT 1.4* 01/13/2012 1044     ADDITIONAL INFORMATION: 1. PROGNOSTIC INDICATORS - ACIS Results IMMUNOHISTOCHEMICAL AND MORPHOMETRIC ANALYSIS BY THE AUTOMATED CELLULAR IMAGING SYSTEM (ACIS) Estrogen Receptor (Negative, <1%): 0%, NEGATIVE Progesterone Receptor (Negative, <1%): 0%, NEGATIVE COMMENT: The negative hormone receptor study(ies) in this case have an internal positive control. All controls stained appropriately Abigail Miyamoto MD Pathologist, Electronic Signature ( Signed 10/07/2011) FINAL DIAGNOSIS 1 of 4 FINAL for [place], Intisar O ([id number]) Diagnosis 1. Breast, lumpectomy, Left - INVASIVE GRADE III, DUCTAL CARCINOMA, SPANNING 0.7 CM. - ASSOCIATED HIGH GRADED DUCTAL CARCINOMA IN SITU. - LYMPH/VASCULAR INVASION NOT IDENTIFIED. - MARGINS ARE NEGATIVE. - SEE ONCOLOGY TEMPLATE. 2. Lymph node, sentinel, biopsy, Left axillary#1 - ONE BENIGN LYMPH NODE WITH NO TUMOR (0/1). - BENIGN GLANDULAR EPITHELIAL INCLUSIONS PRESENT. - SEE COMMENT. 3. Breast, excision, Posterior margin - BENIGN BREAST PARENCHYMA. - NO ATYPIA, HYPERPLASIA, OR MALIGNANCY IDENTIFIED. 4. Lymph node, sentinel, biopsy, Left axillary #2 - ONE BENIGN LYMPH NODE WITH NO TUMOR SEEN (0/1). Microscopic Comment 1. BREAST, INVASIVE TUMOR, WITH LYMPH NODE SAMPLING Specimen, including laterality: Left breast with posterior margin and sentinel lymph nodes. Procedure: Left breast lumpectomy with posterior margin excision and sentinel lymph node  biopsies. Grade: III. Tubule formation: 3. Nuclear pleomorphism: 3. Mitotic:3. Tumor size (gross measurement and glass slide measurement): 0.7 cm. Margins: Invasive, distance to closest margin: At least 0.8  cm. In-situ, distance to closest margin: At least 0.8 cm. Lymphovascular invasion: Not identified. Ductal carcinoma in situ: Yes. Grade: High grade. Extensive intraductal component: No. Lobular neoplasia: No. Tumor focality: Unifocal. Treatment effect: N/A. Extent of tumor: Confined to breast parenchyma. Lymph nodes: # examined: 2. Lymph nodes with metastasis: 0. Breast prognostic profile: Performed on previous case ([id number]) Estrogen receptor: 0%, negative. Progesterone receptor: 0%, negative. Her 2 neu: 3.09, amplified. Ki-67: 53%. Non-neoplastic breast: Fat necrosis present. TNM: pT1b, pN0, MX. Comments: An estrogen receptor and progesterone receptor will be repeated on the current tumor and reported in an addendum. (RAH:gt, 10/01/11) 2. The left sentinel axillary lymph node #1 shows benign glandular epithelial inclusions. Some of the inclusions are ciliated. The nuclei are bland appearing and are not malignant. Smooth muscle myosin, p63 and calponin immunohistochemical stains are performed which do not show a myoepithelial layer. 2 of 4 FINAL for [place], JOYELL EMAMI ([id number]) Microscopic Comment(continued) Although this is the case, the inclusions are benign and do not represent metastatic carcinoma. Both Dr. Frederica Kuster and Dr. Colonel Bald have seen the left sentinel axillary lymph node in consultation with agreement that the inclusions are benign and do not represent metastatic carcinoma. Zandra Abts MD Pathologist, Electronic Signature (Case signed 10/02/2011) Specimen Gross and Clinical Information Specimen(s) Obtained: 1. Breast, lumpectomy, Left 2. Lymph node, sentinel, biopsy, Left axillary#1 3. Breast, excision, Posterior margin 4. Lymph node, sentinel,  biopsy, Left axillary #2 Specimen Clinical  RADIOGRAPHIC STUDIES:  ASSESSMENT: 66 year old with   1. 0.7 cm high grade invasive ductal carcinoma that is ER-, PR- Her2Neu +, sentinel node negative (T1bN0) pathologic stage I.Because patient is HER-2 positive she is receiving adjuvant chemotherapy and Herceptin. She is going to be receiving 4 cycles of TCH combination and then Herceptin alone to complete out 1years worth of treatment.  2. Patient will be seen by Dr. Mitzi Hansen for start of Radiation therapy. She will continue to receive herceptin every 3 weeks  3. Bronchitis  4. Anemia due to chemotherapy   PLAN:   1. Proceed with scheduled appointment for herceptin today  2. Keep your appointment with Dr. Mitzi Hansen  3. Azithromycin for empiric treatment of possible bronchitis   4. 2 units of PRBC for anemia  5. I will see you back in 3 weeks  All questions were answered. The patient knows to call the clinic with any problems, questions or concerns. We can certainly see the patient much sooner if necessary.  I spent 25 minutes counseling the patient face to face. The total time spent in the appointment was 30 minutes.    Drue Second, MD Medical/Oncology [facility] [phone] (beeper) [phone] (Office)  01/20/2012, 12:49 PM

## 2012-01-20 NOTE — Patient Instructions (Addendum)
1. herceptin today and then in 3 weeks  2. Blood transfusion on 01/22/12  3. Azithromycin for bronchitis take as directed  4. Follow up in 3 weeks with me and herceptin

## 2012-01-21 ENCOUNTER — Ambulatory Visit: Payer: Medicare Other

## 2012-01-22 ENCOUNTER — Ambulatory Visit (HOSPITAL_BASED_OUTPATIENT_CLINIC_OR_DEPARTMENT_OTHER): Payer: Medicare Other

## 2012-01-22 VITALS — BP 133/87 | HR 91 | Temp 98.2°F | Resp 20

## 2012-01-22 DIAGNOSIS — D649 Anemia, unspecified: Secondary | ICD-10-CM

## 2012-01-22 LAB — PREPARE RBC (CROSSMATCH)

## 2012-01-22 MED ORDER — DIPHENHYDRAMINE HCL 25 MG PO CAPS
25.0000 mg | ORAL_CAPSULE | Freq: Once | ORAL | Status: AC
  Administered 2012-01-22: 25 mg via ORAL

## 2012-01-22 MED ORDER — SODIUM CHLORIDE 0.9 % IJ SOLN
10.0000 mL | INTRAMUSCULAR | Status: AC | PRN
  Administered 2012-01-22: 10 mL
  Filled 2012-01-22: qty 10

## 2012-01-22 MED ORDER — HEPARIN SOD (PORK) LOCK FLUSH 100 UNIT/ML IV SOLN
500.0000 [IU] | Freq: Every day | INTRAVENOUS | Status: AC | PRN
  Administered 2012-01-22: 500 [IU]
  Filled 2012-01-22: qty 5

## 2012-01-22 MED ORDER — ACETAMINOPHEN 325 MG PO TABS
650.0000 mg | ORAL_TABLET | Freq: Once | ORAL | Status: AC
  Administered 2012-01-22: 650 mg via ORAL

## 2012-01-22 MED ORDER — SODIUM CHLORIDE 0.9 % IV SOLN
250.0000 mL | Freq: Once | INTRAVENOUS | Status: AC
  Administered 2012-01-22: 250 mL via INTRAVENOUS

## 2012-01-22 NOTE — Patient Instructions (Addendum)
Blood Transfusion Information  WHAT IS A BLOOD TRANSFUSION?  A transfusion is the replacement of blood or some of its parts. Blood is made up of multiple cells which provide different functions.   Red blood cells carry oxygen and are used for blood loss replacement.   White blood cells fight against infection.   Platelets control bleeding.   Plasma helps clot blood.   Other blood products are available for specialized needs, such as hemophilia or other clotting disorders.  BEFORE THE TRANSFUSION   Who gives blood for transfusions?    You may be able to donate blood to be used at a later date on yourself (autologous donation).   Relatives can be asked to donate blood. This is generally not any safer than if you have received blood from a stranger. The same precautions are taken to ensure safety when a relative's blood is donated.   Healthy volunteers who are fully evaluated to make sure their blood is safe. This is blood bank blood.  Transfusion therapy is the safest it has ever been in the practice of medicine. Before blood is taken from a donor, a complete history is taken to make sure that person has no history of diseases nor engages in risky social behavior (examples are intravenous drug use or sexual activity with multiple partners). The donor's travel history is screened to minimize risk of transmitting infections, such as malaria. The donated blood is tested for signs of infectious diseases, such as HIV and hepatitis. The blood is then tested to be sure it is compatible with you in order to minimize the chance of a transfusion reaction. If you or a relative donates blood, this is often done in anticipation of surgery and is not appropriate for emergency situations. It takes many days to process the donated blood.  RISKS AND COMPLICATIONS  Although transfusion therapy is very safe and saves many lives, the main dangers of transfusion include:    Getting an infectious disease.   Developing a  transfusion reaction. This is an allergic reaction to something in the blood you were given. Every precaution is taken to prevent this.  The decision to have a blood transfusion has been considered carefully by your caregiver before blood is given. Blood is not given unless the benefits outweigh the risks.  AFTER THE TRANSFUSION   Right after receiving a blood transfusion, you will usually feel much better and more energetic. This is especially true if your red blood cells have gotten low (anemic). The transfusion raises the level of the red blood cells which carry oxygen, and this usually causes an energy increase.   The nurse administering the transfusion will monitor you carefully for complications.  HOME CARE INSTRUCTIONS   No special instructions are needed after a transfusion. You may find your energy is better. Speak with your caregiver about any limitations on activity for underlying diseases you may have.  SEEK MEDICAL CARE IF:    Your condition is not improving after your transfusion.   You develop redness or irritation at the intravenous (IV) site.  SEEK IMMEDIATE MEDICAL CARE IF:   Any of the following symptoms occur over the next 12 hours:   Shaking chills.   You have a temperature by mouth above 102 F (38.9 C), not controlled by medicine.   Chest, back, or muscle pain.   People around you feel you are not acting correctly or are confused.   Shortness of breath or difficulty breathing.   Dizziness and fainting.     You get a rash or develop hives.   You have a decrease in urine output.   Your urine turns a dark color or changes to pink, red, or brown.  Any of the following symptoms occur over the next 10 days:   You have a temperature by mouth above 102 F (38.9 C), not controlled by medicine.   Shortness of breath.   Weakness after normal activity.   The white part of the eye turns yellow (jaundice).   You have a decrease in the amount of urine or are urinating less often.   Your  urine turns a dark color or changes to pink, red, or brown.  Document Released: 05/15/2000 Document Revised: 05/07/2011 Document Reviewed: 01/02/2008  Exit[employer] Patient Information 2012 [employer].

## 2012-01-23 LAB — TYPE AND SCREEN
ABO/RH(D): O NEG
Unit division: 0
Unit division: 0

## 2012-01-27 ENCOUNTER — Other Ambulatory Visit: Payer: Medicare Other | Admitting: Lab

## 2012-01-27 ENCOUNTER — Ambulatory Visit: Payer: Medicare Other | Admitting: Oncology

## 2012-01-27 ENCOUNTER — Ambulatory Visit: Payer: Medicare Other

## 2012-01-28 ENCOUNTER — Ambulatory Visit (INDEPENDENT_AMBULATORY_CARE_PROVIDER_SITE_OTHER): Payer: Medicare Other | Admitting: General Practice

## 2012-01-28 ENCOUNTER — Encounter: Payer: Self-pay | Admitting: Radiation Oncology

## 2012-01-28 ENCOUNTER — Ambulatory Visit
Admission: RE | Admit: 2012-01-28 | Discharge: 2012-01-28 | Disposition: A | Discharge status: Home / Self Care | Payer: Medicare Other | Source: Ambulatory Visit | Attending: Radiation Oncology | Admitting: Radiation Oncology

## 2012-01-28 DIAGNOSIS — E538 Deficiency of other specified B group vitamins: Secondary | ICD-10-CM

## 2012-01-28 DIAGNOSIS — C50319 Malignant neoplasm of lower-inner quadrant of unspecified female breast: Secondary | ICD-10-CM

## 2012-01-28 MED ORDER — CYANOCOBALAMIN 1000 MCG/ML IJ SOLN
1000.0000 ug | Freq: Once | INTRAMUSCULAR | Status: AC
  Administered 2012-01-28: 1000 ug via INTRAMUSCULAR

## 2012-01-29 ENCOUNTER — Ambulatory Visit: Payer: Medicare Other

## 2012-01-29 NOTE — Progress Notes (Signed)
  Radiation Oncology         [phone]) [phone] ________________________________  Name: [name] MRN: [id number]  Date: 01/28/2012  DOB: 03-Oct-1945   SIMULATION AND TREATMENT PLANNING NOTE  The patient presented for simulation prior to beginning her course of radiation treatment for her diagnosis of left-sided breast cancer. The patient was placed in a supine position on a breast board. A customized accuform device was also constructed, and this complex treatment device will be used on a daily basis during her treatment. In this fashion, a CT scan was obtained through the chest area and an isocenter was placed near the chest wall within the left breast. Both a free breathing scan and a breath-hold scan were both obtained to determine which technique was optimal, and to see if the breath-hold technique markedly improved the anticipated dose to the heart. This was the case. Therefore we will use this technique.  The patient will be planned to receive a course of radiation initially to a dose of 45 gray. This will consist of a whole breast radiotherapy technique. To accomplish this, 2 customized blocks have been designed which will correspond to medial and lateral whole breast tangent fields. This treatment will be accomplished at 1.8 gray per fraction. A complex isodose plan is requested to ensure that the breast target area is adequately covered dosimetrically. A forward planning technique will also be evaluated to determine if this approach improves the plan. It is anticipated that the patient will then receive a 16 gray boost to the seroma cavity which has been contoured. This will be accomplished at 2 gray per fraction. The final anticipated total dose therefore will correspond to 61 gray.  This initial treatment will consist of a 3-D conformal technique. The seroma has been contoured as the primary target structure. Additionally, dose volume histograms of both this target as well as the lungs and  heart will also be evaluated. Such an approach is necessary to ensure that the target area is adequately covered while the nearby critical normal structures are adequately spared.   _______________________________   Radene Gunning, MD, PhD

## 2012-02-03 ENCOUNTER — Other Ambulatory Visit: Payer: Self-pay | Admitting: Endocrinology

## 2012-02-05 ENCOUNTER — Ambulatory Visit
Admission: RE | Admit: 2012-02-05 | Discharge: 2012-02-05 | Disposition: A | Discharge status: Home / Self Care | Payer: Medicare Other | Source: Ambulatory Visit | Attending: Radiation Oncology | Admitting: Radiation Oncology

## 2012-02-05 DIAGNOSIS — C50319 Malignant neoplasm of lower-inner quadrant of unspecified female breast: Secondary | ICD-10-CM

## 2012-02-05 NOTE — Progress Notes (Signed)
  Radiation Oncology         [phone] ________________________________  Name: [name] MRN: [id number]  Date: 02/05/2012  DOB: Oct 18, 1945  Simulation Verification Note   NARRATIVE: The patient was brought to the treatment unit and placed in the planned treatment position. The clinical setup was verified. Then port films were obtained and uploaded to the radiation oncology medical record software.  The treatment beams were carefully compared against the planned radiation fields. The position, location, and shape of the radiation fields was reviewed. The targeted volume of tissue appears to be appropriately covered by the radiation beams. Based on my personal review, I approved the simulation verification. The patient's treatment will proceed as planned.  ________________________________   Radene Gunning, MD, PhD

## 2012-02-08 ENCOUNTER — Ambulatory Visit
Admission: RE | Admit: 2012-02-08 | Discharge: 2012-02-08 | Disposition: A | Discharge status: Home / Self Care | Payer: Medicare Other | Source: Ambulatory Visit | Attending: Radiation Oncology | Admitting: Radiation Oncology

## 2012-02-09 ENCOUNTER — Ambulatory Visit
Admission: RE | Admit: 2012-02-09 | Discharge: 2012-02-09 | Disposition: A | Discharge status: Home / Self Care | Payer: Medicare Other | Source: Ambulatory Visit | Attending: Radiation Oncology | Admitting: Radiation Oncology

## 2012-02-10 ENCOUNTER — Telehealth: Payer: Self-pay | Admitting: *Deleted

## 2012-02-10 ENCOUNTER — Ambulatory Visit (HOSPITAL_COMMUNITY)
Admission: RE | Admit: 2012-02-10 | Discharge: 2012-02-10 | Disposition: A | Discharge status: Home / Self Care | Payer: Medicare Other | Source: Ambulatory Visit | Attending: Oncology | Admitting: Oncology

## 2012-02-10 ENCOUNTER — Other Ambulatory Visit (HOSPITAL_BASED_OUTPATIENT_CLINIC_OR_DEPARTMENT_OTHER): Payer: Medicare Other | Admitting: Lab

## 2012-02-10 ENCOUNTER — Ambulatory Visit (HOSPITAL_BASED_OUTPATIENT_CLINIC_OR_DEPARTMENT_OTHER): Payer: Medicare Other | Admitting: Oncology

## 2012-02-10 ENCOUNTER — Encounter: Payer: Self-pay | Admitting: Oncology

## 2012-02-10 ENCOUNTER — Ambulatory Visit (HOSPITAL_BASED_OUTPATIENT_CLINIC_OR_DEPARTMENT_OTHER): Payer: Medicare Other

## 2012-02-10 ENCOUNTER — Ambulatory Visit
Admission: RE | Admit: 2012-02-10 | Discharge: 2012-02-10 | Disposition: A | Discharge status: Home / Self Care | Payer: Medicare Other | Source: Ambulatory Visit | Attending: Radiation Oncology | Admitting: Radiation Oncology

## 2012-02-10 ENCOUNTER — Telehealth: Payer: Self-pay | Admitting: Medical Oncology

## 2012-02-10 VITALS — BP 126/61 | HR 63 | Temp 98.2°F | Resp 20 | Ht 65.0 in | Wt 212.9 lb

## 2012-02-10 DIAGNOSIS — C50319 Malignant neoplasm of lower-inner quadrant of unspecified female breast: Secondary | ICD-10-CM

## 2012-02-10 DIAGNOSIS — T451X5A Adverse effect of antineoplastic and immunosuppressive drugs, initial encounter: Secondary | ICD-10-CM

## 2012-02-10 DIAGNOSIS — Z5112 Encounter for antineoplastic immunotherapy: Secondary | ICD-10-CM

## 2012-02-10 DIAGNOSIS — Z853 Personal history of malignant neoplasm of breast: Secondary | ICD-10-CM | POA: Insufficient documentation

## 2012-02-10 DIAGNOSIS — M7989 Other specified soft tissue disorders: Secondary | ICD-10-CM

## 2012-02-10 DIAGNOSIS — M25559 Pain in unspecified hip: Secondary | ICD-10-CM

## 2012-02-10 DIAGNOSIS — R609 Edema, unspecified: Secondary | ICD-10-CM

## 2012-02-10 LAB — CBC WITH DIFFERENTIAL/PLATELET
Eosinophils Absolute: 0.1 10*3/uL (ref 0.0–0.5)
HGB: 8.7 g/dL — ABNORMAL LOW (ref 11.6–15.9)
LYMPH%: 15 % (ref 14.0–49.7)
MCHC: 32.6 g/dL (ref 31.5–36.0)
MONO#: 0.7 10*3/uL (ref 0.1–0.9)
NEUT%: 75.9 % (ref 38.4–76.8)
Platelets: 176 10*3/uL (ref 145–400)

## 2012-02-10 LAB — COMPREHENSIVE METABOLIC PANEL (CC13)
BUN: 11 mg/dL (ref 7.0–26.0)
CO2: 24 mEq/L (ref 22–29)
Calcium: 7 mg/dL — ABNORMAL LOW (ref 8.4–10.4)
Chloride: 104 mEq/L (ref 98–107)
Creatinine: 0.8 mg/dL (ref 0.6–1.1)
Glucose: 110 mg/dl — ABNORMAL HIGH (ref 70–99)

## 2012-02-10 MED ORDER — SODIUM CHLORIDE 0.9 % IV SOLN
Freq: Once | INTRAVENOUS | Status: AC
  Administered 2012-02-10: 13:00:00 via INTRAVENOUS

## 2012-02-10 MED ORDER — SODIUM CHLORIDE 0.9 % IJ SOLN
10.0000 mL | INTRAMUSCULAR | Status: DC | PRN
  Administered 2012-02-10: 10 mL
  Filled 2012-02-10: qty 10

## 2012-02-10 MED ORDER — ACETAMINOPHEN 325 MG PO TABS
650.0000 mg | ORAL_TABLET | Freq: Once | ORAL | Status: AC
  Administered 2012-02-10: 650 mg via ORAL

## 2012-02-10 MED ORDER — TRASTUZUMAB CHEMO INJECTION 440 MG
6.0000 mg/kg | Freq: Once | INTRAVENOUS | Status: AC
  Administered 2012-02-10: 567 mg via INTRAVENOUS
  Filled 2012-02-10: qty 27

## 2012-02-10 MED ORDER — DIPHENHYDRAMINE HCL 25 MG PO CAPS
50.0000 mg | ORAL_CAPSULE | Freq: Once | ORAL | Status: AC
  Administered 2012-02-10: 50 mg via ORAL

## 2012-02-10 MED ORDER — HEPARIN SOD (PORK) LOCK FLUSH 100 UNIT/ML IV SOLN
500.0000 [IU] | Freq: Once | INTRAVENOUS | Status: AC | PRN
  Administered 2012-02-10: 500 [IU]
  Filled 2012-02-10: qty 5

## 2012-02-10 MED ORDER — LORAZEPAM 0.5 MG PO TABS: 0.5000 mg | ORAL_TABLET | Freq: Four times a day (QID) | ORAL | Status: DC | PRN

## 2012-02-10 MED ORDER — OXYCODONE-ACETAMINOPHEN 5-325 MG PO TABS: 1.0000 | ORAL_TABLET | Freq: Four times a day (QID) | ORAL | Status: DC | PRN

## 2012-02-10 NOTE — Telephone Encounter (Signed)
Message copied by Tylene Fantasia on Wed Feb 10, 2012  4:51 PM ------      Message from: Victorino December      Created: Wed Feb 10, 2012  4:03 PM       Please call patient: no abnormality in the hip

## 2012-02-10 NOTE — Progress Notes (Signed)
*  Preliminary Results* Left lower extremity venous duplex completed. Left lower extremity is negative for deep vein thrombosis. Preliminary results discussed with RN at Dr.Khan's office.  02/10/2012 3:07 PM Gertie Fey, RDMS, RDCS

## 2012-02-10 NOTE — Telephone Encounter (Signed)
Per radiology, patient negative for DVT in left lower extremity

## 2012-02-10 NOTE — Telephone Encounter (Signed)
Per MD, no abnormality in hip.  Patient without questions at this time.  Instructed patient to call with any questions or concerns.

## 2012-02-10 NOTE — Telephone Encounter (Signed)
Gave patient appointment for lower duplex vensous and right hip x-ray for 02-10-2012 at 2:30pm  Patient is aware of the appointments

## 2012-02-10 NOTE — Patient Instructions (Addendum)
X-ray hips  Oxycodone and ativan prescription  Doppler of left leg  Proceed with herceptin  Anemia monitor for now  Return as scheduled

## 2012-02-10 NOTE — Progress Notes (Signed)
Pt tolerated treatment well.  Discharged to home with husband.

## 2012-02-10 NOTE — Progress Notes (Signed)
OFFICE PROGRESS NOTE  CC  [name], MD [address] [facility] 4th Floor [place] [address] Dr. Claud Kelp  DIAGNOSIS: 48 year ol female with new diagnosis of stage I ER-/PR-/Her2Neu positive invasive ductal breast cancer  PRIOR THERAPY: 1. 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 ER negative PR negative Her2 Neu positive with Ki -67 53%,   2. S/P porta cath palcement for chemotherapy  #3 patient has begun her adjuvant chemotherapy consisting of Taxotere carboplatinum and Herceptin. Her treatment began in May 2013. A total of 4 cycles of Taxotere carboplatinum and Herceptin combination are planned. Once she completes this she will then proceed to radiation therapy with concomitant Herceptin to be given every 3 weeks to finish out a year of treatment.  CURRENT THERAPY: Here for Herceptin  INTERVAL HISTORY: [name] 66 y.o. female returns for follow up.Remainder of the 10 point review of systems is negative.Patient is complaining of left lower extremity swelling as well as hip pain which has significantly worsened. She also is fatigued with some shortness of breath. She otherwise denies any nausea vomiting fevers chills or night sweats. She has no bleeding problems no chest pains or palpitations. Remainder of the 10 point review of systems is negative.  MEDICAL HISTORY: Past Medical History  Diagnosis Date  . Obesity   . PUD (peptic ulcer disease)   . Leukopenia   . Abdominal pain   . Short bowel syndrome   . Internal hemorrhoids without mention of complication   . Stricture and stenosis of esophagus   . Osteoarthrosis, unspecified whether generalized or localized, unspecified site   . Thyrotoxicosis without mention of goiter or other cause, without mention of thyrotoxic crisis or storm   . Other and unspecified hyperlipidemia   . Gout, unspecified   . Diverticulosis of colon (without  mention of hemorrhage)   . C. difficile colitis   . VITAMIN B12 DEFICIENCY 08/30/2009  . GOITER, MULTINODULAR 04/02/2009  . HYPOTHYROIDISM, POST-RADIATION 08/13/2009  . ASYMPTOMATIC POSTMENOPAUSAL STATUS 10/11/2008  . Esophageal reflux 06/12/2008  . PONV (postoperative nausea and vomiting)   . Varicose veins   . Type II or unspecified type diabetes mellitus without mention of complication, not stated as uncontrolled     no med in years diet controled  . Blood transfusion   . UTI (urinary tract infection)   . Kidney stones     "several"  . Unspecified essential hypertension   . Angina   . Shortness of breath on exertion     "sometimes"  . Anemia   . ANEMIA, IRON DEFICIENCY 05/08/2009  . History of lower GI bleeding   . H/O hiatal hernia   . Migraines   . Breast cancer 09/29/11    invasive grade III ductal ca,assoc high grade dcis,ER/PR=neg    ALLERGIES:  is allergic to aspirin; iodine; and morphine and related.  MEDICATIONS:  Current Outpatient Prescriptions  Medication Sig Dispense Refill  . Calcium Carbonate-Vitamin D (CALCIUM-VITAMIN D) 600-200 MG-UNIT CAPS Take 1 capsule by mouth daily.        . calcium gluconate 650 MG tablet Take 1 tablet (650 mg total) by mouth daily.  30 tablet  2  . cyanocobalamin (,VITAMIN B-12,) 1000 MCG/ML injection Inject 1,000 mcg into the muscle every 30 (thirty) days. Next one due the 26th of this month      . cyclobenzaprine (FLEXERIL) 5 MG tablet Take 5 mg  by mouth 3 (three) times daily as needed.      Marland Kitchen dexamethasone (DECADRON) 4 MG tablet TAKE 2 TABLETS TWICE DAILY DAY PRIOR TO TREATMENT, DAY OF AND DAY AFTER  30 tablet  2  . gabapentin (NEURONTIN) 100 MG capsule Take 1 capsule (100 mg total) by mouth 2 times daily at 12 noon and 4 pm.  60 capsule  3  . levothyroxine (SYNTHROID, LEVOTHROID) 125 MCG tablet 1 TAB ONCE DAILY  30 tablet  5  . lidocaine-prilocaine (EMLA) cream Apply topically as needed.  30 g  0  . LORazepam (ATIVAN) 0.5 MG tablet Take  1 tablet (0.5 mg total) by mouth every 6 (six) hours as needed (Nausea or vomiting).  30 tablet  0  . lovastatin (MEVACOR) 20 MG tablet Take 20 mg by mouth at bedtime.      . magnesium chloride (SLOW-MAG) 64 MG TBEC Take 1 tablet by mouth daily.       Marland Kitchen omeprazole (PRILOSEC) 40 MG capsule Take 1 capsule (40 mg total) by mouth daily.  30 capsule  6  . oxyCODONE-acetaminophen (PERCOCET/ROXICET) 5-325 MG per tablet Take 1 tablet by mouth every 6 (six) hours as needed. For pain  60 tablet  0  . potassium chloride SA (KLOR-CON M20) 20 MEQ tablet Take 1 tablet (20 mEq total) by mouth 2 (two) times daily.  10 tablet  0  . prochlorperazine (COMPAZINE) 10 MG tablet Take 10 mg by mouth every 6 (six) hours as needed.      . prochlorperazine (COMPAZINE) 25 MG suppository Place 25 mg rectally every 12 (twelve) hours as needed.      . promethazine (PHENERGAN) 25 MG tablet TAKE 1 TABLET BY MOUTH EVERY 8 HOURS AS NEEDED FOR NAUSEA  30 tablet  2  . thiamine 100 MG tablet Take 100 mg by mouth daily.        . trazodone (DESYREL) 300 MG tablet TAKE 1 TABLET (300 MG TOTAL) BY MOUTH AT BEDTIME.  30 tablet  5    SURGICAL HISTORY:  Past Surgical History  Procedure Date  . Vein ligation and stripping 1980's    Right leg  . Lithotripsy     "4 or 5 times"  . Bunionectomy 1970's    bilateral  . Colonoscopy   . Esophagogastroduodenoscopy 07/15/2005  . Thyroid ultrasound 12/1994 and 12/1995  . Mastectomy w/ nodes partial 09/29/11    left  . Port a cath placement 09/29/11    right chest  . Breast surgery   . Cholecystectomy 1990's  . Abdominal hysterectomy 1970's    with BSO  . Dilation and curettage of uterus   . Colon surgery     "several surgeries for short bowel syndrome"  . Abdominal adhesion surgery 1980's thru 1990's    "several"  . Kidney stone surgery 1990's    "tried to go up & get it but pushed it further up"  . Portacath placement 09/29/2011    Procedure: INSERTION PORT-A-CATH;  Surgeon: Ernestene Mention, MD;  Location: [facility] OR;  Service: General;  Laterality: N/A;  . Breast biopsy 08/13/11    left breast lower inner quadrant  . Breast lumpectomy w/ needle localization 09/29/11    left  breast=lymph node,excision benign/ ER/PR=neg, her 2 Positive    REVIEW OF SYSTEMS:  Pertinent items are noted in HPI.   PHYSICAL EXAMINATION: General appearance: alert, cooperative and appears stated age Resp: clear to auscultation bilaterally and normal percussion bilaterally Cardio: regular rate and  rhythm, S1, S2 normal, no murmur, click, rub or gallop GI: soft, non-tender; bowel sounds normal; no masses,  no organomegaly Extremities: extremities normal, atraumatic, no cyanosis or edema Neurologic: Grossly normal Bilateral Breast Exam: left breast healing incision scar, with some tenderness, no evidence of infections.Right breast no masses or nipple discharge ECOG PERFORMANCE STATUS: 1 - Symptomatic but completely ambulatory  Blood pressure 126/61, pulse 63, temperature 98.2 F (36.8 C), resp. rate 20, height 5\' 5"  (1.651 m), weight 212 lb 14.4 oz (96.571 kg).  LABORATORY DATA: Lab Results  Component Value Date   WBC 8.6 02/10/2012   HGB 8.7* 02/10/2012   HCT 26.7* 02/10/2012   MCV 93.7 02/10/2012   PLT 176 02/10/2012      Chemistry      Component Value Date/Time   NA 141 01/20/2012 1139   K 3.0* 01/20/2012 1139   CL 103 01/20/2012 1139   CO2 23 01/20/2012 1139   BUN 14 01/20/2012 1139   CREATININE 0.86 01/20/2012 1139   CREATININE 0.88 03/31/2011 1550      Component Value Date/Time   CALCIUM 6.7* 01/20/2012 1139   CALCIUM 9.5 05/15/2010 2153   ALKPHOS 105 01/20/2012 1139   AST 24 01/20/2012 1139   ALT 13 01/20/2012 1139   BILITOT 0.8 01/20/2012 1139     ADDITIONAL INFORMATION: 1. PROGNOSTIC INDICATORS - ACIS Results IMMUNOHISTOCHEMICAL AND MORPHOMETRIC ANALYSIS BY THE AUTOMATED CELLULAR IMAGING SYSTEM (ACIS) Estrogen Receptor (Negative, <1%): 0%, NEGATIVE Progesterone Receptor  (Negative, <1%): 0%, NEGATIVE COMMENT: The negative hormone receptor study(ies) in this case have an internal positive control. All controls stained appropriately Abigail Miyamoto MD Pathologist, Electronic Signature ( Signed 10/07/2011) FINAL DIAGNOSIS 1 of 4 FINAL for [name], Rachel O ([id number]) Diagnosis 1. Breast, lumpectomy, Left - INVASIVE GRADE III, DUCTAL CARCINOMA, SPANNING 0.7 CM. - ASSOCIATED HIGH GRADED DUCTAL CARCINOMA IN SITU. - LYMPH/VASCULAR INVASION NOT IDENTIFIED. - MARGINS ARE NEGATIVE. - SEE ONCOLOGY TEMPLATE. 2. Lymph node, sentinel, biopsy, Left axillary#1 - ONE BENIGN LYMPH NODE WITH NO TUMOR (0/1). - BENIGN GLANDULAR EPITHELIAL INCLUSIONS PRESENT. - SEE COMMENT. 3. Breast, excision, Posterior margin - BENIGN BREAST PARENCHYMA. - NO ATYPIA, HYPERPLASIA, OR MALIGNANCY IDENTIFIED. 4. Lymph node, sentinel, biopsy, Left axillary #2 - ONE BENIGN LYMPH NODE WITH NO TUMOR SEEN (0/1). Microscopic Comment 1. BREAST, INVASIVE TUMOR, WITH LYMPH NODE SAMPLING Specimen, including laterality: Left breast with posterior margin and sentinel lymph nodes. Procedure: Left breast lumpectomy with posterior margin excision and sentinel lymph node biopsies. Grade: III. Tubule formation: 3. Nuclear pleomorphism: 3. Mitotic:3. Tumor size (gross measurement and glass slide measurement): 0.7 cm. Margins: Invasive, distance to closest margin: At least 0.8 cm. In-situ, distance to closest margin: At least 0.8 cm. Lymphovascular invasion: Not identified. Ductal carcinoma in situ: Yes. Grade: High grade. Extensive intraductal component: No. Lobular neoplasia: No. Tumor focality: Unifocal. Treatment effect: N/A. Extent of tumor: Confined to breast parenchyma. Lymph nodes: # examined: 2. Lymph nodes with metastasis: 0. Breast prognostic profile: Performed on previous case ([id number]) Estrogen receptor: 0%, negative. Progesterone receptor: 0%, negative. Her 2 neu:  3.09, amplified. Ki-67: 53%. Non-neoplastic breast: Fat necrosis present. TNM: pT1b, pN0, MX. Comments: An estrogen receptor and progesterone receptor will be repeated on the current tumor and reported in an addendum. (RAH:gt, 10/01/11) 2. The left sentinel axillary lymph node #1 shows benign glandular epithelial inclusions. Some of the inclusions are ciliated. The nuclei are bland appearing and are not malignant. Smooth muscle myosin, p63 and calponin immunohistochemical stains are performed which do  not show a myoepithelial layer. 2 of 4 FINAL for [name], Rachel HAWKS ([id number]) Microscopic Comment(continued) Although this is the case, the inclusions are benign and do not represent metastatic carcinoma. Both Dr. Frederica Kuster and Dr. Colonel Bald have seen the left sentinel axillary lymph node in consultation with agreement that the inclusions are benign and do not represent metastatic carcinoma. Zandra Abts MD Pathologist, Electronic Signature (Case signed 10/02/2011) Specimen Gross and Clinical Information Specimen(s) Obtained: 1. Breast, lumpectomy, Left 2. Lymph node, sentinel, biopsy, Left axillary#1 3. Breast, excision, Posterior margin 4. Lymph node, sentinel, biopsy, Left axillary #2 Specimen Clinical  RADIOGRAPHIC STUDIES:  ASSESSMENT: 66 year old with   1. 0.7 cm high grade invasive ductal carcinoma that is ER-, PR- Her2Neu +, sentinel node negative (T1bN0) pathologic stage I.Because patient is HER-2 positive she is receiving adjuvant chemotherapy and Herceptin. She is going to be receiving 4 cycles of TCH combination and then Herceptin alone to complete out 1years worth of treatment.  2.Receiving radiation therapy  3. Left hip pain and left leg swelling  4. Anemia due to chemotherapy   PLAN:  X-ray hips  Oxycodone and ativan prescription  Doppler of left leg  Proceed with herceptin  Anemia monitor for now  Return as scheduled  All questions were answered. The  patient knows to call the clinic with any problems, questions or concerns. We can certainly see the patient much sooner if necessary.  I spent 25 minutes counseling the patient face to face. The total time spent in the appointment was 30 minutes.    Drue Second, MD Medical/Oncology [facility] [phone] (beeper) [phone] (Office)  02/10/2012, 11:49 AM

## 2012-02-10 NOTE — Patient Instructions (Addendum)
[facility] Discharge Instructions for Patients Receiving Chemotherapy  Today you received the following chemotherapy agents: herceptin   To help prevent nausea and vomiting after your treatment, we encourage you to take your nausea medication. Take it as often as prescribed.  hours.   If you develop nausea and vomiting that is not controlled by your nausea medication, call the clinic. If it is after clinic hours your family physician or the after hours number for the clinic or go to the Emergency Department.   BELOW ARE SYMPTOMS THAT SHOULD BE REPORTED IMMEDIATELY:  *FEVER GREATER THAN 100.5 F  *CHILLS WITH OR WITHOUT FEVER  NAUSEA AND VOMITING THAT IS NOT CONTROLLED WITH YOUR NAUSEA MEDICATION  *UNUSUAL SHORTNESS OF BREATH  *UNUSUAL BRUISING OR BLEEDING  TENDERNESS IN MOUTH AND THROAT WITH OR WITHOUT PRESENCE OF ULCERS  *URINARY PROBLEMS  *BOWEL PROBLEMS  UNUSUAL RASH Items with * indicate a potential emergency and should be followed up as soon as possible.  Feel free to call the clinic you have any questions or concerns. The clinic phone number is [phone].   I have been informed and understand all the instructions given to me. I know to contact the clinic, my physician, or go to the Emergency Department if any problems should occur. I do not have any questions at this time, but understand that I may call the clinic during office hours   should I have any questions or need assistance in obtaining follow up care.    __________________________________________  _____________  __________ Signature of Patient or Authorized Representative            Date                   Time    __________________________________________ Nurse's Signature

## 2012-02-11 ENCOUNTER — Encounter: Payer: Self-pay | Admitting: Radiation Oncology

## 2012-02-11 ENCOUNTER — Ambulatory Visit
Admission: RE | Admit: 2012-02-11 | Discharge: 2012-02-11 | Disposition: A | Discharge status: Home / Self Care | Payer: Medicare Other | Source: Ambulatory Visit | Attending: Radiation Oncology | Admitting: Radiation Oncology

## 2012-02-11 VITALS — BP 111/79 | HR 80 | Temp 97.6°F | Wt 219.2 lb

## 2012-02-11 DIAGNOSIS — C50319 Malignant neoplasm of lower-inner quadrant of unspecified female breast: Secondary | ICD-10-CM

## 2012-02-11 MED ORDER — ALRA NON-METALLIC DEODORANT (RAD-ONC)
1.0000 "application " | Freq: Once | TOPICAL | Status: AC
  Administered 2012-02-11: 1 via TOPICAL

## 2012-02-11 MED ORDER — RADIAPLEXRX EX GEL
Freq: Once | CUTANEOUS | Status: AC
  Administered 2012-02-11: 1 via TOPICAL

## 2012-02-11 NOTE — Progress Notes (Signed)
Ms. Dockham in today with c/o of pain in her right hip when lying on the treatment table.  Reports pain level as a level 10 on a scale of 0-10. She did not take any Oxycodone prior to treatment but suggested she take it daily at least 45 minutes prior to each treatment.  Travel by wheelchair today and has a cane for ambulating.    Given Radiation Therapy and You booklet and educated regarding skin care and fatigue management and prompted protein in diet to ehnance tissue cell repair.  Also encouraged her to report changes in pain level if her Oxycodone is not relieving her pain.  She stated understanding.  Informed that Dr. Mitzi Hansen will see her every Thursday for an assessment, but if this changes she will be notified in advance.

## 2012-02-11 NOTE — Progress Notes (Signed)
Department of Radiation Oncology  Phone:  [phone] Fax:        [phone]  Weekly Treatment Note    Name: [name] Date: 02/11/2012 MRN: [id number] DOB: [birth year]   Current dose: 7.2 Gy  Current fraction: 4   MEDICATIONS: Current Outpatient Prescriptions  Medication Sig Dispense Refill  . Calcium Carbonate-Vitamin D (CALCIUM-VITAMIN D) 600-200 MG-UNIT CAPS Take 1 capsule by mouth daily.        . calcium gluconate 650 MG tablet Take 1 tablet (650 mg total) by mouth daily.  30 tablet  2  . cyanocobalamin (,VITAMIN B-12,) 1000 MCG/ML injection Inject 1,000 mcg into the muscle every 30 (thirty) days. Next one due the 26th of this month      . cyclobenzaprine (FLEXERIL) 5 MG tablet Take 5 mg by mouth 3 (three) times daily as needed.      Marland Kitchen dexamethasone (DECADRON) 4 MG tablet TAKE 2 TABLETS TWICE DAILY DAY PRIOR TO TREATMENT, DAY OF AND DAY AFTER  30 tablet  2  . levothyroxine (SYNTHROID, LEVOTHROID) 125 MCG tablet 1 TAB ONCE DAILY  30 tablet  5  . lidocaine-prilocaine (EMLA) cream Apply topically as needed.  30 g  0  . LORazepam (ATIVAN) 0.5 MG tablet Take 1 tablet (0.5 mg total) by mouth every 6 (six) hours as needed (Nausea or vomiting).  30 tablet  0  . lovastatin (MEVACOR) 20 MG tablet Take 20 mg by mouth at bedtime.      . magnesium chloride (SLOW-MAG) 64 MG TBEC Take 1 tablet by mouth daily.       Marland Kitchen omeprazole (PRILOSEC) 40 MG capsule Take 1 capsule (40 mg total) by mouth daily.  30 capsule  6  . ondansetron (ZOFRAN) 8 MG tablet       . oxyCODONE-acetaminophen (PERCOCET/ROXICET) 5-325 MG per tablet Take 1 tablet by mouth every 6 (six) hours as needed. For pain  60 tablet  0  . prochlorperazine (COMPAZINE) 10 MG tablet Take 10 mg by mouth every 6 (six) hours as needed.      . prochlorperazine (COMPAZINE) 25 MG suppository Place 25 mg rectally every 12 (twelve) hours as needed.      . promethazine (PHENERGAN) 25 MG tablet TAKE 1 TABLET BY MOUTH EVERY 8 HOURS AS  NEEDED FOR NAUSEA  30 tablet  2  . thiamine 100 MG tablet Take 100 mg by mouth daily.        . trazodone (DESYREL) 300 MG tablet TAKE 1 TABLET (300 MG TOTAL) BY MOUTH AT BEDTIME.  30 tablet  5  . gabapentin (NEURONTIN) 100 MG capsule Take 1 capsule (100 mg total) by mouth 2 times daily at 12 noon and 4 pm.  60 capsule  3   Current Facility-Administered Medications  Medication Dose Route Frequency Provider Last Rate Last Dose  . hyaluronate sodium (RADIAPLEXRX) gel   Topical Once Jonna Coup, MD   1 application at 02/11/12 1042  . non-metallic deodorant (ALRA) 1 application  1 application Topical Once Jonna Coup, MD   1 application at 02/11/12 1043   Facility-Administered Medications Ordered in Other Encounters  Medication Dose Route Frequency Provider Last Rate Last Dose  . 0.9 %  sodium chloride infusion   Intravenous Once Victorino December, MD      . acetaminophen (TYLENOL) tablet 650 mg  650 mg Oral Once Victorino December, MD   650 mg at 02/10/12 1254  . diphenhydrAMINE (BENADRYL) capsule 50 mg  50 mg  Oral Once Victorino December, MD   50 mg at 02/10/12 1254  . heparin lock flush 100 unit/mL  500 Units Intracatheter Once PRN Victorino December, MD   500 Units at 02/10/12 1415  . trastuzumab (HERCEPTIN) 567 mg in sodium chloride 0.9 % 250 mL chemo infusion  6 mg/kg (Treatment Plan Actual) Intravenous Once Victorino December, MD   567 mg at 02/10/12 1328  . DISCONTD: sodium chloride 0.9 % injection 10 mL  10 mL Intracatheter PRN Victorino December, MD   10 mL at 02/10/12 1415     ALLERGIES: Aspirin; Iodine; and Morphine and related   LABORATORY DATA:  Lab Results  Component Value Date   WBC 8.6 02/10/2012   HGB 8.7* 02/10/2012   HCT 26.7* 02/10/2012   MCV 93.7 02/10/2012   PLT 176 02/10/2012   Lab Results  Component Value Date   NA 141 02/10/2012   K 2.8* 02/10/2012   CL 104 02/10/2012   CO2 24 02/10/2012   Lab Results  Component Value Date   ALT 15 02/10/2012   AST 30 02/10/2012   ALKPHOS 70  02/10/2012   BILITOT 1.30* 02/10/2012     NARRATIVE: [name] was seen today for weekly treatment management. The chart was checked and the patient's films were reviewed. Patient is doing very well with the actual treatment. However she is having some hip pain after lying on the table. She has been given a recent prescription of oxycodone. We discussed having her use this prior to treatment each day.  PHYSICAL EXAMINATION: weight is 219 lb 3.2 oz (99.428 kg). Her temperature is 97.6 F (36.4 C). Her blood pressure is 111/79 and her pulse is 80.        ASSESSMENT: The patient is doing satisfactorily with treatment.  PLAN: We will continue with the patient's radiation treatment as planned.

## 2012-02-12 ENCOUNTER — Telehealth: Payer: Self-pay | Admitting: Medical Oncology

## 2012-02-12 ENCOUNTER — Ambulatory Visit
Admission: RE | Admit: 2012-02-12 | Discharge: 2012-02-12 | Disposition: A | Discharge status: Home / Self Care | Payer: Medicare Other | Source: Ambulatory Visit | Attending: Radiation Oncology | Admitting: Radiation Oncology

## 2012-02-12 NOTE — Telephone Encounter (Signed)
Per MD, patient without blood clot in leg.  Patient expressed understanding, no further questions at this time.

## 2012-02-12 NOTE — Telephone Encounter (Signed)
Message copied by Tylene Fantasia on Fri Feb 12, 2012  9:38 AM ------      Message from: Victorino December      Created: Thu Feb 11, 2012  6:51 PM       Please call patient: no blood clot in the leg

## 2012-02-15 ENCOUNTER — Ambulatory Visit
Admission: RE | Admit: 2012-02-15 | Discharge: 2012-02-15 | Disposition: A | Discharge status: Home / Self Care | Payer: Medicare Other | Source: Ambulatory Visit | Attending: Radiation Oncology | Admitting: Radiation Oncology

## 2012-02-16 ENCOUNTER — Ambulatory Visit: Admission: RE | Admit: 2012-02-16 | Payer: Medicare Other | Source: Ambulatory Visit

## 2012-02-16 ENCOUNTER — Encounter: Payer: Self-pay | Admitting: *Deleted

## 2012-02-16 NOTE — Progress Notes (Signed)
Saw patient today accompanied by husband in center at 8:00am.  Awaiting for radiation treatment. Ambulating with assistive device.  Says "things are going, she can not complain"  Does not express any SE/concerns/needs at this time to RN.  She understands radiation/time frame and appointment status. Encouraged to call if questions/needs arises at which she agreed.

## 2012-02-17 ENCOUNTER — Ambulatory Visit: Admission: RE | Admit: 2012-02-17 | Payer: Medicare Other | Source: Ambulatory Visit

## 2012-02-18 ENCOUNTER — Encounter: Payer: Self-pay | Admitting: Internal Medicine

## 2012-02-18 ENCOUNTER — Ambulatory Visit
Admission: RE | Admit: 2012-02-18 | Discharge: 2012-02-18 | Disposition: A | Discharge status: Home / Self Care | Payer: Medicare Other | Source: Ambulatory Visit | Attending: Radiation Oncology | Admitting: Radiation Oncology

## 2012-02-18 VITALS — BP 134/86 | HR 98 | Temp 97.9°F | Wt 216.8 lb

## 2012-02-18 DIAGNOSIS — C50319 Malignant neoplasm of lower-inner quadrant of unspecified female breast: Secondary | ICD-10-CM

## 2012-02-18 NOTE — Progress Notes (Signed)
Department of Radiation Oncology  Phone:  [phone] Fax:        [phone]  Weekly Treatment Note    Name: [name] Date: 02/18/2012 MRN: [id number] DOB: 14-Oct-1945   Current dose: 12.6 Gy  Current fraction: 7   MEDICATIONS: Current Outpatient Prescriptions  Medication Sig Dispense Refill  . Calcium Carbonate-Vitamin D (CALCIUM-VITAMIN D) 600-200 MG-UNIT CAPS Take 1 capsule by mouth daily.        . calcium gluconate 650 MG tablet Take 1 tablet (650 mg total) by mouth daily.  30 tablet  2  . cyanocobalamin (,VITAMIN B-12,) 1000 MCG/ML injection Inject 1,000 mcg into the muscle every 30 (thirty) days. Next one due the 26th of this month      . cyclobenzaprine (FLEXERIL) 5 MG tablet Take 5 mg by mouth 3 (three) times daily as needed.      Marland Kitchen dexamethasone (DECADRON) 4 MG tablet TAKE 2 TABLETS TWICE DAILY DAY PRIOR TO TREATMENT, DAY OF AND DAY AFTER  30 tablet  2  . gabapentin (NEURONTIN) 100 MG capsule Take 1 capsule (100 mg total) by mouth 2 times daily at 12 noon and 4 pm.  60 capsule  3  . levothyroxine (SYNTHROID, LEVOTHROID) 125 MCG tablet 1 TAB ONCE DAILY  30 tablet  5  . lidocaine-prilocaine (EMLA) cream Apply topically as needed.  30 g  0  . LORazepam (ATIVAN) 0.5 MG tablet Take 1 tablet (0.5 mg total) by mouth every 6 (six) hours as needed (Nausea or vomiting).  30 tablet  0  . lovastatin (MEVACOR) 20 MG tablet Take 20 mg by mouth at bedtime.      . magnesium chloride (SLOW-MAG) 64 MG TBEC Take 1 tablet by mouth daily.       Marland Kitchen omeprazole (PRILOSEC) 40 MG capsule Take 1 capsule (40 mg total) by mouth daily.  30 capsule  6  . ondansetron (ZOFRAN) 8 MG tablet       . oxyCODONE-acetaminophen (PERCOCET/ROXICET) 5-325 MG per tablet Take 1 tablet by mouth every 6 (six) hours as needed. For pain  60 tablet  0  . prochlorperazine (COMPAZINE) 10 MG tablet Take 10 mg by mouth every 6 (six) hours as needed.      . prochlorperazine (COMPAZINE) 25 MG suppository Place 25  mg rectally every 12 (twelve) hours as needed.      . promethazine (PHENERGAN) 25 MG tablet TAKE 1 TABLET BY MOUTH EVERY 8 HOURS AS NEEDED FOR NAUSEA  30 tablet  2  . thiamine 100 MG tablet Take 100 mg by mouth daily.        . trazodone (DESYREL) 300 MG tablet TAKE 1 TABLET (300 MG TOTAL) BY MOUTH AT BEDTIME.  30 tablet  5     ALLERGIES: Aspirin; Iodine; and Morphine and related   LABORATORY DATA:  Lab Results  Component Value Date   WBC 8.6 02/10/2012   HGB 8.7* 02/10/2012   HCT 26.7* 02/10/2012   MCV 93.7 02/10/2012   PLT 176 02/10/2012   Lab Results  Component Value Date   NA 141 02/10/2012   K 2.8* 02/10/2012   CL 104 02/10/2012   CO2 24 02/10/2012   Lab Results  Component Value Date   ALT 15 02/10/2012   AST 30 02/10/2012   ALKPHOS 70 02/10/2012   BILITOT 1.30* 02/10/2012     NARRATIVE: [name] was seen today for weekly treatment management. The chart was checked and the patient's films were reviewed. The patient  is doing well with her treatment. No problems so far. She has not noticed any significant skin changes.  PHYSICAL EXAMINATION: weight is 216 lb 12.8 oz (98.34 kg). Her temperature is 97.9 F (36.6 C). Her blood pressure is 134/86 and her pulse is 98.      slight hyperpigmentation. Overall her skin looks good.  ASSESSMENT: The patient is doing satisfactorily with treatment.  PLAN: We will continue with the patient's radiation treatment as planned.

## 2012-02-18 NOTE — Progress Notes (Signed)
Patient here for weekly under treat assessment of left breast cancer.Completed 7 of 33 treatments.No skin changes.Has some skin tenderness. Has some fatigue and concern for mild swelling of left foot.Swelling goes down  At bedtime.

## 2012-02-19 ENCOUNTER — Ambulatory Visit
Admission: RE | Admit: 2012-02-19 | Discharge: 2012-02-19 | Disposition: A | Discharge status: Home / Self Care | Payer: Medicare Other | Source: Ambulatory Visit | Attending: Radiation Oncology | Admitting: Radiation Oncology

## 2012-02-22 ENCOUNTER — Ambulatory Visit
Admission: RE | Admit: 2012-02-22 | Discharge: 2012-02-22 | Disposition: A | Discharge status: Home / Self Care | Payer: Medicare Other | Source: Ambulatory Visit | Attending: Radiation Oncology | Admitting: Radiation Oncology

## 2012-02-22 ENCOUNTER — Encounter: Payer: Self-pay | Admitting: Radiation Oncology

## 2012-02-22 VITALS — BP 120/92 | HR 94 | Temp 97.3°F | Resp 20 | Wt 217.0 lb

## 2012-02-22 DIAGNOSIS — C50319 Malignant neoplasm of lower-inner quadrant of unspecified female breast: Secondary | ICD-10-CM

## 2012-02-22 MED ORDER — OXYCODONE-ACETAMINOPHEN 5-325 MG PO TABS: 2.0000 | ORAL_TABLET | Freq: Four times a day (QID) | ORAL | Status: DC | PRN

## 2012-02-22 NOTE — Progress Notes (Signed)
[facility]    Radiation Oncology [address] [place]     Maryln Gottron, M.D. [place], [place] [address]               Billie Lade, M.D., Ph.D. Phone: [phone]      Molli Hazard A. Kathrynn Running, M.D. Fax: [phone]      Radene Gunning, M.D., Ph.D.         Lurline Hare, M.D.         Grayland Jack, M.D Weekly Treatment Management Note  Name: [name]     MRN: [id number]        CSN: [id number] Date: 02/22/2012      DOB: 09-Dec-1945  CC: [name], MD         Everardo All    Status: Outpatient  Diagnosis: The encounter diagnosis was Cancer of lower-inner quadrant of female breast.  Current Dose: 1620 cGy  Current Fraction: 9  Planned Dose: 6100 cGy  Narrative: [name] was seen today for weekly treatment management. The chart was checked and port films  were reviewed. The patient asked to be seen today for continued pain in the left knee.  She has been taking 1 Percocet every 4-6 hours given to her by Dr. Welton Flakes.  this is not taking care of her pain at this time. Patient is having difficulty walking in  for her treatment. She is using a walker at this time. Orthopedic surgery is recommended surgical correction of her problem given her diagnosis of breast cancer this is been postponed.  Aspirin; Iodine; and Morphine and related Current Outpatient Prescriptions  Medication Sig Dispense Refill  . Calcium Carbonate-Vitamin D (CALCIUM-VITAMIN D) 600-200 MG-UNIT CAPS Take 1 capsule by mouth daily.        . calcium gluconate 650 MG tablet Take 1 tablet (650 mg total) by mouth daily.  30 tablet  2  . cyanocobalamin (,VITAMIN B-12,) 1000 MCG/ML injection Inject 1,000 mcg into the muscle every 30 (thirty) days. Next one due the 26th of this month      . cyclobenzaprine (FLEXERIL) 5 MG tablet Take 5 mg by mouth 3 (three) times daily as needed.      Marland Kitchen dexamethasone (DECADRON) 4 MG tablet TAKE 2 TABLETS TWICE DAILY DAY PRIOR TO TREATMENT, DAY OF AND DAY AFTER   30 tablet  2  . gabapentin (NEURONTIN) 100 MG capsule Take 1 capsule (100 mg total) by mouth 2 times daily at 12 noon and 4 pm.  60 capsule  3  . levothyroxine (SYNTHROID, LEVOTHROID) 125 MCG tablet 1 TAB ONCE DAILY  30 tablet  5  . lidocaine-prilocaine (EMLA) cream Apply topically as needed.  30 g  0  . LORazepam (ATIVAN) 0.5 MG tablet Take 1 tablet (0.5 mg total) by mouth every 6 (six) hours as needed (Nausea or vomiting).  30 tablet  0  . lovastatin (MEVACOR) 20 MG tablet Take 20 mg by mouth at bedtime.      . magnesium chloride (SLOW-MAG) 64 MG TBEC Take 1 tablet by mouth daily.       Marland Kitchen omeprazole (PRILOSEC) 40 MG capsule Take 1 capsule (40 mg total) by mouth daily.  30 capsule  6  . ondansetron (ZOFRAN) 8 MG tablet       . prochlorperazine (COMPAZINE) 10 MG tablet Take 10 mg by mouth every 6 (six) hours as needed.      . prochlorperazine (COMPAZINE) 25 MG suppository Place 25 mg rectally every 12 (  twelve) hours as needed.      . promethazine (PHENERGAN) 25 MG tablet TAKE 1 TABLET BY MOUTH EVERY 8 HOURS AS NEEDED FOR NAUSEA  30 tablet  2  . thiamine 100 MG tablet Take 100 mg by mouth daily.        . trazodone (DESYREL) 300 MG tablet TAKE 1 TABLET (300 MG TOTAL) BY MOUTH AT BEDTIME.  30 tablet  5  . oxyCODONE-acetaminophen (PERCOCET/ROXICET) 5-325 MG per tablet Take 2 tablets by mouth every 6 (six) hours as needed for pain.  60 tablet  0   Labs:  Lab Results  Component Value Date   WBC 8.6 02/10/2012   HGB 8.7* 02/10/2012   HCT 26.7* 02/10/2012   MCV 93.7 02/10/2012   PLT 176 02/10/2012   Lab Results  Component Value Date   CREATININE 0.8 02/10/2012   BUN 11.0 02/10/2012   NA 141 02/10/2012   K 2.8* 02/10/2012   CL 104 02/10/2012   CO2 24 02/10/2012   Lab Results  Component Value Date   ALT 15 02/10/2012   AST 30 02/10/2012   PHOS 3.3 03/20/2010   BILITOT 1.30* 02/10/2012    Physical Examination:  weight is 217 lb (98.431 kg). Her oral temperature is 97.3 F (36.3 C). Her blood  pressure is 120/92 and her pulse is 94. Her respiration is 20.    Wt Readings from Last 3 Encounters:  02/22/12 217 lb (98.431 kg)  02/18/12 216 lb 12.8 oz (98.34 kg)  02/11/12 219 lb 3.2 oz (99.428 kg)     Lungs - Normal respiratory effort, chest expands symmetrically. Lungs are clear to auscultation, no crackles or wheezes.  Heart has regular rhythm and rate  Abdomen is soft and non tender with normal bowel sounds She walks with a cane. She is tender with palpation along the anterior knee region along the left side. Assessment:  Patient tolerating treatments well  Plan: Continue treatment per original radiation prescription.  She has run out of her current prescription medication. In light of her pain problems she will be placed on 2 Percocet every 6 hours, dispense 60.

## 2012-02-22 NOTE — Progress Notes (Signed)
patient completed 9/33 left breast rad txs, came to nursing walking slow with a cane, c/o pain in her knees. Asking for refill on percocet, saw last rx for percocet written by Dr.Khan on 02/10/12 :1q 6 hours, patient is taking 2 at a time, sometimes 2x day sometimes once a day, left knee wors than right knee, checked left breast, no pain, slight tanning on left breast,skin intact Eating and drinking ok 8:53 AM

## 2012-02-23 ENCOUNTER — Ambulatory Visit
Admission: RE | Admit: 2012-02-23 | Discharge: 2012-02-23 | Disposition: A | Discharge status: Home / Self Care | Payer: Medicare Other | Source: Ambulatory Visit | Attending: Radiation Oncology | Admitting: Radiation Oncology

## 2012-02-24 ENCOUNTER — Ambulatory Visit
Admission: RE | Admit: 2012-02-24 | Discharge: 2012-02-24 | Disposition: A | Discharge status: Home / Self Care | Payer: Medicare Other | Source: Ambulatory Visit | Attending: Radiation Oncology | Admitting: Radiation Oncology

## 2012-02-25 ENCOUNTER — Ambulatory Visit
Admission: RE | Admit: 2012-02-25 | Discharge: 2012-02-25 | Disposition: A | Discharge status: Home / Self Care | Payer: Medicare Other | Source: Ambulatory Visit | Attending: Radiation Oncology | Admitting: Radiation Oncology

## 2012-02-25 ENCOUNTER — Encounter: Payer: Self-pay | Admitting: Radiation Oncology

## 2012-02-25 VITALS — BP 117/69 | HR 87 | Temp 97.8°F | Resp 20 | Wt 210.0 lb

## 2012-02-25 DIAGNOSIS — C50319 Malignant neoplasm of lower-inner quadrant of unspecified female breast: Secondary | ICD-10-CM

## 2012-02-25 NOTE — Progress Notes (Signed)
Patient alert,oriented x3, here weekly rad tx left breast,12/33 completed, redness under left axilla and under inframmary fold, skin intact, sharp pains occasionally throughout left breast, resolves soon, using radiaplex bid, encouraged to put radiaplex on back under left shoulder dryness there 8:48 AM

## 2012-02-25 NOTE — Progress Notes (Signed)
Department of Radiation Oncology  Phone:  [phone] Fax:        [phone]  Weekly Treatment Note    Name: [name] Date: 02/25/2012 MRN: [id number] DOB: 1945/06/07   Current dose: 21.6 Gy  Current fraction: 12   MEDICATIONS: Current Outpatient Prescriptions  Medication Sig Dispense Refill  . Calcium Carbonate-Vitamin D (CALCIUM-VITAMIN D) 600-200 MG-UNIT CAPS Take 1 capsule by mouth daily.        . calcium gluconate 650 MG tablet Take 1 tablet (650 mg total) by mouth daily.  30 tablet  2  . cyanocobalamin (,VITAMIN B-12,) 1000 MCG/ML injection Inject 1,000 mcg into the muscle every 30 (thirty) days. Next one due the 26th of this month      . cyclobenzaprine (FLEXERIL) 5 MG tablet Take 5 mg by mouth 3 (three) times daily as needed.      Marland Kitchen dexamethasone (DECADRON) 4 MG tablet TAKE 2 TABLETS TWICE DAILY DAY PRIOR TO TREATMENT, DAY OF AND DAY AFTER  30 tablet  2  . gabapentin (NEURONTIN) 100 MG capsule Take 1 capsule (100 mg total) by mouth 2 times daily at 12 noon and 4 pm.  60 capsule  3  . levothyroxine (SYNTHROID, LEVOTHROID) 125 MCG tablet 1 TAB ONCE DAILY  30 tablet  5  . lidocaine-prilocaine (EMLA) cream Apply topically as needed.  30 g  0  . LORazepam (ATIVAN) 0.5 MG tablet Take 1 tablet (0.5 mg total) by mouth every 6 (six) hours as needed (Nausea or vomiting).  30 tablet  0  . lovastatin (MEVACOR) 20 MG tablet Take 20 mg by mouth at bedtime.      . magnesium chloride (SLOW-MAG) 64 MG TBEC Take 1 tablet by mouth daily.       Marland Kitchen omeprazole (PRILOSEC) 40 MG capsule Take 1 capsule (40 mg total) by mouth daily.  30 capsule  6  . ondansetron (ZOFRAN) 8 MG tablet       . oxyCODONE-acetaminophen (PERCOCET/ROXICET) 5-325 MG per tablet Take 2 tablets by mouth every 6 (six) hours as needed for pain.  60 tablet  0  . prochlorperazine (COMPAZINE) 10 MG tablet Take 10 mg by mouth every 6 (six) hours as needed.      . prochlorperazine (COMPAZINE) 25 MG suppository Place  25 mg rectally every 12 (twelve) hours as needed.      . promethazine (PHENERGAN) 25 MG tablet TAKE 1 TABLET BY MOUTH EVERY 8 HOURS AS NEEDED FOR NAUSEA  30 tablet  2  . thiamine 100 MG tablet Take 100 mg by mouth daily.        . trazodone (DESYREL) 300 MG tablet TAKE 1 TABLET (300 MG TOTAL) BY MOUTH AT BEDTIME.  30 tablet  5     ALLERGIES: Aspirin; Iodine; and Morphine and related   LABORATORY DATA:  Lab Results  Component Value Date   WBC 8.6 02/10/2012   HGB 8.7* 02/10/2012   HCT 26.7* 02/10/2012   MCV 93.7 02/10/2012   PLT 176 02/10/2012   Lab Results  Component Value Date   NA 141 02/10/2012   K 2.8* 02/10/2012   CL 104 02/10/2012   CO2 24 02/10/2012   Lab Results  Component Value Date   ALT 15 02/10/2012   AST 30 02/10/2012   ALKPHOS 70 02/10/2012   BILITOT 1.30* 02/10/2012     NARRATIVE: [name] was seen today for weekly treatment management. The chart was checked and the patient's films were reviewed. The patient  is doing well. She has occasional shooting pains in the breast. These are very brief period a little bit more skin irritation.  PHYSICAL EXAMINATION: weight is 210 lb (95.255 kg). Her oral temperature is 97.8 F (36.6 C). Her blood pressure is 117/69 and her pulse is 87. Her respiration is 20.      some erythema emerging diffusely. Greater hyperpigmentation in the left axilla and the inframammary region. No desquamation.  ASSESSMENT: The patient is doing satisfactorily with treatment.  PLAN: We will continue with the patient's radiation treatment as planned.

## 2012-02-26 ENCOUNTER — Ambulatory Visit (INDEPENDENT_AMBULATORY_CARE_PROVIDER_SITE_OTHER): Payer: Medicare Other

## 2012-02-26 ENCOUNTER — Ambulatory Visit
Admission: RE | Admit: 2012-02-26 | Discharge: 2012-02-26 | Disposition: A | Discharge status: Home / Self Care | Payer: Medicare Other | Source: Ambulatory Visit | Attending: Radiation Oncology | Admitting: Radiation Oncology

## 2012-02-26 DIAGNOSIS — E538 Deficiency of other specified B group vitamins: Secondary | ICD-10-CM

## 2012-02-26 MED ORDER — CYANOCOBALAMIN 1000 MCG/ML IJ SOLN
1000.0000 ug | Freq: Once | INTRAMUSCULAR | Status: AC
  Administered 2012-02-26: 1000 ug via INTRAMUSCULAR

## 2012-02-29 ENCOUNTER — Ambulatory Visit
Admission: RE | Admit: 2012-02-29 | Discharge: 2012-02-29 | Disposition: A | Discharge status: Home / Self Care | Payer: Medicare Other | Source: Ambulatory Visit | Attending: Radiation Oncology | Admitting: Radiation Oncology

## 2012-03-01 ENCOUNTER — Ambulatory Visit
Admission: RE | Admit: 2012-03-01 | Discharge: 2012-03-01 | Disposition: A | Discharge status: Home / Self Care | Payer: Medicare Other | Source: Ambulatory Visit | Attending: Radiation Oncology | Admitting: Radiation Oncology

## 2012-03-02 ENCOUNTER — Encounter: Payer: Self-pay | Admitting: Oncology

## 2012-03-02 ENCOUNTER — Ambulatory Visit (HOSPITAL_BASED_OUTPATIENT_CLINIC_OR_DEPARTMENT_OTHER): Payer: Medicare Other

## 2012-03-02 ENCOUNTER — Ambulatory Visit
Admission: RE | Admit: 2012-03-02 | Discharge: 2012-03-02 | Disposition: A | Discharge status: Home / Self Care | Payer: Medicare Other | Source: Ambulatory Visit | Attending: Radiation Oncology | Admitting: Radiation Oncology

## 2012-03-02 ENCOUNTER — Other Ambulatory Visit (HOSPITAL_BASED_OUTPATIENT_CLINIC_OR_DEPARTMENT_OTHER): Payer: Medicare Other | Admitting: Lab

## 2012-03-02 ENCOUNTER — Ambulatory Visit (HOSPITAL_BASED_OUTPATIENT_CLINIC_OR_DEPARTMENT_OTHER): Payer: Medicare Other | Admitting: Oncology

## 2012-03-02 VITALS — BP 109/76 | HR 88 | Temp 98.4°F | Resp 20 | Ht 65.0 in | Wt 210.5 lb

## 2012-03-02 DIAGNOSIS — M7989 Other specified soft tissue disorders: Secondary | ICD-10-CM

## 2012-03-02 DIAGNOSIS — C50319 Malignant neoplasm of lower-inner quadrant of unspecified female breast: Secondary | ICD-10-CM

## 2012-03-02 DIAGNOSIS — Z5112 Encounter for antineoplastic immunotherapy: Secondary | ICD-10-CM

## 2012-03-02 DIAGNOSIS — M25559 Pain in unspecified hip: Secondary | ICD-10-CM

## 2012-03-02 DIAGNOSIS — T451X5A Adverse effect of antineoplastic and immunosuppressive drugs, initial encounter: Secondary | ICD-10-CM

## 2012-03-02 LAB — COMPREHENSIVE METABOLIC PANEL (CC13)
AST: 28 U/L (ref 5–34)
Albumin: 3.5 g/dL (ref 3.5–5.0)
BUN: 14 mg/dL (ref 7.0–26.0)
Calcium: 7.7 mg/dL — ABNORMAL LOW (ref 8.4–10.4)
Chloride: 106 mEq/L (ref 98–107)
Glucose: 92 mg/dl (ref 70–99)
Potassium: 3 mEq/L — ABNORMAL LOW (ref 3.5–5.1)
Sodium: 141 mEq/L (ref 136–145)
Total Protein: 6.6 g/dL (ref 6.4–8.3)

## 2012-03-02 LAB — CBC WITH DIFFERENTIAL/PLATELET
BASO%: 0.2 % (ref 0.0–2.0)
Basophils Absolute: 0 10*3/uL (ref 0.0–0.1)
HCT: 26.8 % — ABNORMAL LOW (ref 34.8–46.6)
HGB: 8.9 g/dL — ABNORMAL LOW (ref 11.6–15.9)
MONO#: 0.4 10*3/uL (ref 0.1–0.9)
NEUT%: 64.8 % (ref 38.4–76.8)
WBC: 4.7 10*3/uL (ref 3.9–10.3)
lymph#: 1.2 10*3/uL (ref 0.9–3.3)

## 2012-03-02 MED ORDER — HEPARIN SOD (PORK) LOCK FLUSH 100 UNIT/ML IV SOLN
500.0000 [IU] | Freq: Once | INTRAVENOUS | Status: AC | PRN
  Administered 2012-03-02: 500 [IU]
  Filled 2012-03-02: qty 5

## 2012-03-02 MED ORDER — DIPHENHYDRAMINE HCL 25 MG PO CAPS
50.0000 mg | ORAL_CAPSULE | Freq: Once | ORAL | Status: AC
  Administered 2012-03-02: 50 mg via ORAL

## 2012-03-02 MED ORDER — SODIUM CHLORIDE 0.9 % IJ SOLN
10.0000 mL | INTRAMUSCULAR | Status: DC | PRN
  Administered 2012-03-02: 10 mL
  Filled 2012-03-02: qty 10

## 2012-03-02 MED ORDER — TRASTUZUMAB CHEMO INJECTION 440 MG
6.0000 mg/kg | Freq: Once | INTRAVENOUS | Status: AC
  Administered 2012-03-02: 567 mg via INTRAVENOUS
  Filled 2012-03-02: qty 27

## 2012-03-02 MED ORDER — ACETAMINOPHEN 325 MG PO TABS
650.0000 mg | ORAL_TABLET | Freq: Once | ORAL | Status: AC
  Administered 2012-03-02: 650 mg via ORAL

## 2012-03-02 MED ORDER — SODIUM CHLORIDE 0.9 % IV SOLN
Freq: Once | INTRAVENOUS | Status: AC
  Administered 2012-03-02: 15:00:00 via INTRAVENOUS

## 2012-03-02 NOTE — Patient Instructions (Signed)
[facility] Discharge Instructions for Patients Receiving Chemotherapy  Today you received the following chemotherapy agents Herceptin.   If you develop nausea and vomiting that is not controlled by your nausea medication, call the clinic. If it is after clinic hours your family physician or the after hours number for the clinic or go to the Emergency Department.   BELOW ARE SYMPTOMS THAT SHOULD BE REPORTED IMMEDIATELY:  *FEVER GREATER THAN 100.5 F  *CHILLS WITH OR WITHOUT FEVER  NAUSEA AND VOMITING THAT IS NOT CONTROLLED WITH YOUR NAUSEA MEDICATION  *UNUSUAL SHORTNESS OF BREATH  *UNUSUAL BRUISING OR BLEEDING  TENDERNESS IN MOUTH AND THROAT WITH OR WITHOUT PRESENCE OF ULCERS  *URINARY PROBLEMS  *BOWEL PROBLEMS  UNUSUAL RASH Items with * indicate a potential emergency and should be followed up as soon as possible.  One of the nurses will contact you 24 hours after your treatment. Please let the nurse know about any problems that you may have experienced. Feel free to call the clinic you have any questions or concerns. The clinic phone number is [phone].   I have been informed and understand all the instructions given to me. I know to contact the clinic, my physician, or go to the Emergency Department if any problems should occur. I do not have any questions at this time, but understand that I may call the clinic during office hours   should I have any questions or need assistance in obtaining follow up care.    __________________________________________  _____________  __________ Signature of Patient or Authorized Representative            Date                   Time    __________________________________________ Nurse's Signature   

## 2012-03-02 NOTE — Patient Instructions (Addendum)
Proceed with herceptin today  I will see you back in 3 weeks 

## 2012-03-02 NOTE — Progress Notes (Signed)
OFFICE PROGRESS NOTE  CC  Rachel Belling, MD [address] [facility] 4th [address] [place] [address] Dr. 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 ER negative PR negative Her2 Neu positive with Ki -67 53%,   2. S/P porta cath palcement for chemotherapy  #3 patient has begun her adjuvant chemotherapy consisting of Taxotere carboplatinum and Herceptin. Her treatment began in May 2013. A total of 4 cycles of Taxotere carboplatinum and Herceptin combination are planned. Once she completes this she will then proceed to radiation therapy with concomitant Herceptin to be given every 3 weeks to finish out a year of treatment.  CURRENT THERAPY: Here for Herceptin  INTERVAL HISTORY: [name] 66 y.o. female returns for follow up.  patient is doing well she is tolerating her radiation quite nicely without any problems. She is continuing to get Herceptin without problem. However she is having significant hip pain due to arthritis. She has been recommended in the past to have a hip replacement but patient does not want to have the surgery done yet. She wants to wait until all of her Herceptin treatment is completed which will be in May 2014. She is requiring significant amount of pain medications which have been giving to her. She denies any fevers chills night sweats headaches shortness of breath she does have some tingling of her hands and toes. She has no bleeding problems. She does have a little bit of fatigue. Remainder of the 10 point review of systems is negative.  MEDICAL HISTORY: Past Medical History  Diagnosis Date  . Obesity   . PUD (peptic ulcer disease)   . Leukopenia   . Abdominal pain   . Short bowel syndrome   . Internal hemorrhoids without mention of  complication   . Stricture and stenosis of esophagus   . Osteoarthrosis, unspecified whether generalized or localized, unspecified site   . Thyrotoxicosis without mention of goiter or other cause, without mention of thyrotoxic crisis or storm   . Other and unspecified hyperlipidemia   . Gout, unspecified   . Diverticulosis of colon (without mention of hemorrhage)   . C. difficile colitis   . VITAMIN B12 DEFICIENCY 08/30/2009  . GOITER, MULTINODULAR 04/02/2009  . HYPOTHYROIDISM, POST-RADIATION 08/13/2009  . ASYMPTOMATIC POSTMENOPAUSAL STATUS 10/11/2008  . Esophageal reflux 06/12/2008  . PONV (postoperative nausea and vomiting)   . Varicose veins   . Type II or unspecified type diabetes mellitus without mention of complication, not stated as uncontrolled     no med in years diet controled  . Blood transfusion   . UTI (urinary tract infection)   . Kidney stones     "several"  . Unspecified essential hypertension   . Angina   . Shortness of breath on exertion     "sometimes"  . Anemia   . ANEMIA, IRON DEFICIENCY 05/08/2009  . History of lower GI bleeding   . H/O hiatal hernia   . Migraines   . Breast cancer 09/29/11    invasive grade III ductal ca,assoc high grade dcis,ER/PR=neg    ALLERGIES:  is allergic to aspirin; iodine; and morphine and related.  MEDICATIONS:  Current Outpatient Prescriptions  Medication Sig Dispense Refill  . Calcium Carbonate-Vitamin D (CALCIUM-VITAMIN D) 600-200 MG-UNIT CAPS Take 1 capsule by mouth daily.        Marland Kitchen  calcium gluconate 650 MG tablet Take 1 tablet (650 mg total) by mouth daily.  30 tablet  2  . cyanocobalamin (,VITAMIN B-12,) 1000 MCG/ML injection Inject 1,000 mcg into the muscle every 30 (thirty) days. Next one due the 26th of this month      . cyclobenzaprine (FLEXERIL) 5 MG tablet Take 5 mg by mouth 3 (three) times daily as needed.      Marland Kitchen dexamethasone (DECADRON) 4 MG tablet TAKE 2 TABLETS TWICE DAILY DAY PRIOR TO TREATMENT, DAY OF AND DAY AFTER   30 tablet  2  . gabapentin (NEURONTIN) 100 MG capsule Take 1 capsule (100 mg total) by mouth 2 times daily at 12 noon and 4 pm.  60 capsule  3  . levothyroxine (SYNTHROID, LEVOTHROID) 125 MCG tablet 1 TAB ONCE DAILY  30 tablet  5  . lidocaine-prilocaine (EMLA) cream Apply topically as needed.  30 g  0  . LORazepam (ATIVAN) 0.5 MG tablet Take 1 tablet (0.5 mg total) by mouth every 6 (six) hours as needed (Nausea or vomiting).  30 tablet  0  . lovastatin (MEVACOR) 20 MG tablet Take 20 mg by mouth at bedtime.      . magnesium chloride (SLOW-MAG) 64 MG TBEC Take 1 tablet by mouth daily.       Marland Kitchen omeprazole (PRILOSEC) 40 MG capsule Take 1 capsule (40 mg total) by mouth daily.  30 capsule  6  . ondansetron (ZOFRAN) 8 MG tablet       . oxyCODONE-acetaminophen (PERCOCET/ROXICET) 5-325 MG per tablet Take 2 tablets by mouth every 6 (six) hours as needed for pain.  60 tablet  0  . prochlorperazine (COMPAZINE) 10 MG tablet Take 10 mg by mouth every 6 (six) hours as needed.      . prochlorperazine (COMPAZINE) 25 MG suppository Place 25 mg rectally every 12 (twelve) hours as needed.      . promethazine (PHENERGAN) 25 MG tablet TAKE 1 TABLET BY MOUTH EVERY 8 HOURS AS NEEDED FOR NAUSEA  30 tablet  2  . thiamine 100 MG tablet Take 100 mg by mouth daily.        . trazodone (DESYREL) 300 MG tablet TAKE 1 TABLET (300 MG TOTAL) BY MOUTH AT BEDTIME.  30 tablet  5    SURGICAL HISTORY:  Past Surgical History  Procedure Date  . Vein ligation and stripping 1980's    Right leg  . Lithotripsy     "4 or 5 times"  . Bunionectomy 1970's    bilateral  . Colonoscopy   . Esophagogastroduodenoscopy 07/15/2005  . Thyroid ultrasound 12/1994 and 12/1995  . Mastectomy w/ nodes partial 09/29/11    left  . Port a cath placement 09/29/11    right chest  . Breast surgery   . Cholecystectomy 1990's  . Abdominal hysterectomy 1970's    with BSO  . Dilation and curettage of uterus   . Colon surgery     "several surgeries for  short bowel syndrome"  . Abdominal adhesion surgery 1980's thru 1990's    "several"  . Kidney stone surgery 1990's    "tried to go up & get it but pushed it further up"  . Portacath placement 09/29/2011    Procedure: INSERTION PORT-A-CATH;  Surgeon: Ernestene Mention, MD;  Location: [facility] OR;  Service: General;  Laterality: N/A;  . Breast biopsy 08/13/11    left breast lower inner quadrant  . Breast lumpectomy w/ needle localization 09/29/11    left  breast=lymph  node,excision benign/ ER/PR=neg, her 2 Positive    REVIEW OF SYSTEMS:  Pertinent items are noted in HPI.   PHYSICAL EXAMINATION: General appearance: alert, cooperative and appears stated age Resp: clear to auscultation bilaterally and normal percussion bilaterally Cardio: regular rate and rhythm, S1, S2 normal, no murmur, click, rub or gallop GI: soft, non-tender; bowel sounds normal; no masses,  no organomegaly Extremities: extremities normal, atraumatic, no cyanosis or edema Neurologic: Grossly normal Bilateral Breast Exam: left breast healing incision scar, with some tenderness, no evidence of infections.Right breast no masses or nipple discharge ECOG PERFORMANCE STATUS: 1 - Symptomatic but completely ambulatory  Blood pressure 109/76, pulse 88, temperature 98.4 F (36.9 C), temperature source Oral, resp. rate 20, height 5\' 5"  (1.651 m), weight 210 lb 8 oz (95.482 kg).  LABORATORY DATA: Lab Results  Component Value Date   WBC 4.7 03/02/2012   HGB 8.9* 03/02/2012   HCT 26.8* 03/02/2012   MCV 93.7 03/02/2012   PLT 154 03/02/2012      Chemistry      Component Value Date/Time   NA 141 02/10/2012 1103   NA 141 01/20/2012 1139   K 2.8* 02/10/2012 1103   K 3.0* 01/20/2012 1139   CL 104 02/10/2012 1103   CL 103 01/20/2012 1139   CO2 24 02/10/2012 1103   CO2 23 01/20/2012 1139   BUN 11.0 02/10/2012 1103   BUN 14 01/20/2012 1139   CREATININE 0.8 02/10/2012 1103   CREATININE 0.86 01/20/2012 1139   CREATININE 0.88 03/31/2011 1550        Component Value Date/Time   CALCIUM 7.0* 02/10/2012 1103   CALCIUM 6.7* 01/20/2012 1139   CALCIUM 9.5 05/15/2010 2153   ALKPHOS 70 02/10/2012 1103   ALKPHOS 105 01/20/2012 1139   AST 30 02/10/2012 1103   AST 24 01/20/2012 1139   ALT 15 02/10/2012 1103   ALT 13 01/20/2012 1139   BILITOT 1.30* 02/10/2012 1103   BILITOT 0.8 01/20/2012 1139     ADDITIONAL INFORMATION: 1. PROGNOSTIC INDICATORS - ACIS Results IMMUNOHISTOCHEMICAL AND MORPHOMETRIC ANALYSIS BY THE AUTOMATED CELLULAR IMAGING SYSTEM (ACIS) Estrogen Receptor (Negative, <1%): 0%, NEGATIVE Progesterone Receptor (Negative, <1%): 0%, NEGATIVE COMMENT: The negative hormone receptor study(ies) in this case have an internal positive control. All controls stained appropriately Abigail Miyamoto MD Pathologist, Electronic Signature ( Signed 10/07/2011) FINAL DIAGNOSIS 1 of 4 FINAL for Parrales, Janeal O ([id number]) Diagnosis 1. Breast, lumpectomy, Left - INVASIVE GRADE III, DUCTAL CARCINOMA, SPANNING 0.7 CM. - ASSOCIATED HIGH GRADED DUCTAL CARCINOMA IN SITU. - LYMPH/VASCULAR INVASION NOT IDENTIFIED. - MARGINS ARE NEGATIVE. - SEE ONCOLOGY TEMPLATE. 2. Lymph node, sentinel, biopsy, Left axillary#1 - ONE BENIGN LYMPH NODE WITH NO TUMOR (0/1). - BENIGN GLANDULAR EPITHELIAL INCLUSIONS PRESENT. - SEE COMMENT. 3. Breast, excision, Posterior margin - BENIGN BREAST PARENCHYMA. - NO ATYPIA, HYPERPLASIA, OR MALIGNANCY IDENTIFIED. 4. Lymph node, sentinel, biopsy, Left axillary #2 - ONE BENIGN LYMPH NODE WITH NO TUMOR SEEN (0/1). Microscopic Comment 1. BREAST, INVASIVE TUMOR, WITH LYMPH NODE SAMPLING Specimen, including laterality: Left breast with posterior margin and sentinel lymph nodes. Procedure: Left breast lumpectomy with posterior margin excision and sentinel lymph node biopsies. Grade: III. Tubule formation: 3. Nuclear pleomorphism: 3. Mitotic:3. Tumor size (gross measurement and glass slide measurement): 0.7  cm. Margins: Invasive, distance to closest margin: At least 0.8 cm. In-situ, distance to closest margin: At least 0.8 cm. Lymphovascular invasion: Not identified. Ductal carcinoma in situ: Yes. Grade: High grade. Extensive intraductal component: No. Lobular neoplasia: No. Tumor focality:  Unifocal. Treatment effect: N/A. Extent of tumor: Confined to breast parenchyma. Lymph nodes: # examined: 2. Lymph nodes with metastasis: 0. Breast prognostic profile: Performed on previous case ([id number]) Estrogen receptor: 0%, negative. Progesterone receptor: 0%, negative. Her 2 neu: 3.09, amplified. Ki-67: 53%. Non-neoplastic breast: Fat necrosis present. TNM: pT1b, pN0, MX. Comments: An estrogen receptor and progesterone receptor will be repeated on the current tumor and reported in an addendum. (RAH:gt, 10/01/11) 2. The left sentinel axillary lymph node #1 shows benign glandular epithelial inclusions. Some of the inclusions are ciliated. The nuclei are bland appearing and are not malignant. Smooth muscle myosin, p63 and calponin immunohistochemical stains are performed which do not show a myoepithelial layer. 2 of 4 FINAL for Fils, GLENORA MOROCHO ([id number]) Microscopic Comment(continued) Although this is the case, the inclusions are benign and do not represent metastatic carcinoma. Both Dr. Frederica Kuster and Dr. Colonel Bald have seen the left sentinel axillary lymph node in consultation with agreement that the inclusions are benign and do not represent metastatic carcinoma. Zandra Abts MD Pathologist, Electronic Signature (Case signed 10/02/2011) Specimen Gross and Clinical Information Specimen(s) Obtained: 1. Breast, lumpectomy, Left 2. Lymph node, sentinel, biopsy, Left axillary#1 3. Breast, excision, Posterior margin 4. Lymph node, sentinel, biopsy, Left axillary #2 Specimen Clinical  RADIOGRAPHIC STUDIES:  ASSESSMENT: 66 year old with   1. 0.7 cm high grade invasive ductal carcinoma  that is ER-, PR- Her2Neu +, sentinel node negative (T1bN0) pathologic stage I.Because patient is HER-2 positive she is receiving adjuvant chemotherapy and Herceptin. She is going to be receiving 4 cycles of TCH combination and then Herceptin alone to complete out 1years worth of treatment.  2.Receiving radiation therapy  3. Left hip pain and left leg swelling patient at this time does not want to have her hip replacement surgery she wants to wait until she completes all of her Herceptin.  4. Anemia due to chemotherapy   PLAN:  #1 patient will proceed with scheduled Herceptin.  #2 patient is continuing getting radiation she will finish up radiation around October 25.  #3 she will return in 3 weeks' time for next dose of Herceptin. I anticipate that she will finish her Herceptin in May 2014   All questions were answered. The patient knows to call the clinic with any problems, questions or concerns. We can certainly see the patient much sooner if necessary.  I spent 25 minutes counseling the patient face to face. The total time spent in the appointment was 30 minutes.    Drue Second, MD Medical/Oncology [facility] [phone] (beeper) [phone] (Office)  03/02/2012, 2:25 PM

## 2012-03-03 ENCOUNTER — Ambulatory Visit
Admission: RE | Admit: 2012-03-03 | Discharge: 2012-03-03 | Disposition: A | Discharge status: Home / Self Care | Payer: Medicare Other | Source: Ambulatory Visit | Attending: Radiation Oncology | Admitting: Radiation Oncology

## 2012-03-04 ENCOUNTER — Ambulatory Visit
Admission: RE | Admit: 2012-03-04 | Discharge: 2012-03-04 | Disposition: A | Discharge status: Home / Self Care | Payer: Medicare Other | Source: Ambulatory Visit | Attending: Radiation Oncology | Admitting: Radiation Oncology

## 2012-03-04 VITALS — BP 128/89 | HR 82 | Temp 97.8°F | Resp 20 | Wt 212.3 lb

## 2012-03-04 DIAGNOSIS — C50319 Malignant neoplasm of lower-inner quadrant of unspecified female breast: Secondary | ICD-10-CM

## 2012-03-04 NOTE — Addendum Note (Signed)
Encounter addended by: Lowella Petties, RN on: 03/04/2012 11:24 AM<BR>     Documentation filed: Inpatient Document Flowsheet

## 2012-03-04 NOTE — Progress Notes (Signed)
Patient here weekly rad txs left breast, 18/33.  rawness under axilla and under inframmary fold of left breast, patient c/o burning and itvhing, uses neoporin under breast fold, and radiaplex elsewhere on skin,  Will try hydrogel pads for the burning 8:49 AM

## 2012-03-04 NOTE — Progress Notes (Signed)
Department of Radiation Oncology  Phone:  [phone] Fax:        [phone]  Weekly Treatment Note    Name: [name] Date: 03/04/2012 MRN: [id number] DOB: 19-May-1946   Current dose: 32.4 Gy  Current fraction: 18   MEDICATIONS: Current Outpatient Prescriptions  Medication Sig Dispense Refill  . Calcium Carbonate-Vitamin D (CALCIUM-VITAMIN D) 600-200 MG-UNIT CAPS Take 1 capsule by mouth daily.        . calcium gluconate 650 MG tablet Take 1 tablet (650 mg total) by mouth daily.  30 tablet  2  . cyanocobalamin (,VITAMIN B-12,) 1000 MCG/ML injection Inject 1,000 mcg into the muscle every 30 (thirty) days. Next one due the 26th of this month      . cyclobenzaprine (FLEXERIL) 5 MG tablet Take 5 mg by mouth 3 (three) times daily as needed.      Marland Kitchen dexamethasone (DECADRON) 4 MG tablet TAKE 2 TABLETS TWICE DAILY DAY PRIOR TO TREATMENT, DAY OF AND DAY AFTER  30 tablet  2  . gabapentin (NEURONTIN) 100 MG capsule Take 1 capsule (100 mg total) by mouth 2 times daily at 12 noon and 4 pm.  60 capsule  3  . levothyroxine (SYNTHROID, LEVOTHROID) 125 MCG tablet 1 TAB ONCE DAILY  30 tablet  5  . lidocaine-prilocaine (EMLA) cream Apply topically as needed.  30 g  0  . LORazepam (ATIVAN) 0.5 MG tablet Take 1 tablet (0.5 mg total) by mouth every 6 (six) hours as needed (Nausea or vomiting).  30 tablet  0  . lovastatin (MEVACOR) 20 MG tablet Take 20 mg by mouth at bedtime.      . magnesium chloride (SLOW-MAG) 64 MG TBEC Take 1 tablet by mouth daily.       Marland Kitchen omeprazole (PRILOSEC) 40 MG capsule Take 1 capsule (40 mg total) by mouth daily.  30 capsule  6  . ondansetron (ZOFRAN) 8 MG tablet       . oxyCODONE-acetaminophen (PERCOCET/ROXICET) 5-325 MG per tablet Take 2 tablets by mouth every 6 (six) hours as needed for pain.  60 tablet  0  . prochlorperazine (COMPAZINE) 10 MG tablet Take 10 mg by mouth every 6 (six) hours as needed.      . prochlorperazine (COMPAZINE) 25 MG suppository Place  25 mg rectally every 12 (twelve) hours as needed.      . promethazine (PHENERGAN) 25 MG tablet TAKE 1 TABLET BY MOUTH EVERY 8 HOURS AS NEEDED FOR NAUSEA  30 tablet  2  . thiamine 100 MG tablet Take 100 mg by mouth daily.        . trazodone (DESYREL) 300 MG tablet TAKE 1 TABLET (300 MG TOTAL) BY MOUTH AT BEDTIME.  30 tablet  5     ALLERGIES: Aspirin; Iodine; and Morphine and related   LABORATORY DATA:  Lab Results  Component Value Date   WBC 4.7 03/02/2012   HGB 8.9* 03/02/2012   HCT 26.8* 03/02/2012   MCV 93.7 03/02/2012   PLT 154 03/02/2012   Lab Results  Component Value Date   NA 141 03/02/2012   K 3.0* 03/02/2012   CL 106 03/02/2012   CO2 21* 03/02/2012   Lab Results  Component Value Date   ALT 18 03/02/2012   AST 28 03/02/2012   ALKPHOS 64 03/02/2012   BILITOT 0.80 03/02/2012     NARRATIVE: [name] was seen today for weekly treatment management. The chart was checked and the patient's films were reviewed. The patient  has noticed increased irritation under the breasts and under the left arm. She is using some Neosporin as well as radio plaque screen. Nursing has given her hydrogel pads to use in the areas of burning as well for symptomatic relief.  PHYSICAL EXAMINATION: weight is 212 lb 4.8 oz (96.299 kg). Her oral temperature is 97.8 F (36.6 C). Her blood pressure is 128/89 and her pulse is 82. Her respiration is 20.      diffuse hyperpigmentation present. No frank desquamation at this point but increased hyperpigmentation in the area she describes in the left axilla and underneath the breast. I expect some emerging desquamation in these areas over the next week.  ASSESSMENT: The patient is doing satisfactorily with treatment.  PLAN: We will continue with the patient's radiation treatment as planned. Patient will continue her skin cream and use hydrogel pads. We will continue to follow her scan and make additional adjustments as needed.

## 2012-03-07 ENCOUNTER — Ambulatory Visit
Admission: RE | Admit: 2012-03-07 | Discharge: 2012-03-07 | Disposition: A | Discharge status: Home / Self Care | Payer: Medicare Other | Source: Ambulatory Visit | Attending: Radiation Oncology | Admitting: Radiation Oncology

## 2012-03-08 ENCOUNTER — Ambulatory Visit
Admission: RE | Admit: 2012-03-08 | Discharge: 2012-03-08 | Disposition: A | Discharge status: Home / Self Care | Payer: Medicare Other | Source: Ambulatory Visit | Attending: Radiation Oncology | Admitting: Radiation Oncology

## 2012-03-09 ENCOUNTER — Ambulatory Visit
Admission: RE | Admit: 2012-03-09 | Discharge: 2012-03-09 | Disposition: A | Discharge status: Home / Self Care | Payer: Medicare Other | Source: Ambulatory Visit | Attending: Radiation Oncology | Admitting: Radiation Oncology

## 2012-03-10 ENCOUNTER — Ambulatory Visit
Admission: RE | Admit: 2012-03-10 | Discharge: 2012-03-10 | Disposition: A | Discharge status: Home / Self Care | Payer: Medicare Other | Source: Ambulatory Visit | Attending: Radiation Oncology | Admitting: Radiation Oncology

## 2012-03-11 ENCOUNTER — Ambulatory Visit
Admission: RE | Admit: 2012-03-11 | Discharge: 2012-03-11 | Disposition: A | Discharge status: Home / Self Care | Payer: Medicare Other | Source: Ambulatory Visit | Attending: Radiation Oncology | Admitting: Radiation Oncology

## 2012-03-11 VITALS — BP 124/87 | HR 88 | Temp 97.7°F | Wt 209.4 lb

## 2012-03-11 DIAGNOSIS — C50319 Malignant neoplasm of lower-inner quadrant of unspecified female breast: Secondary | ICD-10-CM

## 2012-03-11 MED ORDER — OXYCODONE-ACETAMINOPHEN 5-325 MG PO TABS: 2.0000 | ORAL_TABLET | Freq: Four times a day (QID) | ORAL | Status: DC | PRN

## 2012-03-11 NOTE — Progress Notes (Signed)
Patient here for routine weekly under treat visit of left breast cancer.Skin very dark with small blisters.mammary fold without peeling.Pain of left knee.

## 2012-03-11 NOTE — Progress Notes (Signed)
Department of Radiation Oncology  Phone:  [phone] Fax:        [phone]  Weekly Treatment Note    Name: [name] Date: 03/11/2012 MRN: [id number] DOB: [birth year]   Current dose: 41.4 Gy  Current fraction: 23   MEDICATIONS: Current Outpatient Prescriptions  Medication Sig Dispense Refill  . Calcium Carbonate-Vitamin D (CALCIUM-VITAMIN D) 600-200 MG-UNIT CAPS Take 1 capsule by mouth daily.        . calcium gluconate 650 MG tablet Take 1 tablet (650 mg total) by mouth daily.  30 tablet  2  . cyanocobalamin (,VITAMIN B-12,) 1000 MCG/ML injection Inject 1,000 mcg into the muscle every 30 (thirty) days. Next one due the 26th of this month      . cyclobenzaprine (FLEXERIL) 5 MG tablet Take 5 mg by mouth 3 (three) times daily as needed.      Marland Kitchen dexamethasone (DECADRON) 4 MG tablet TAKE 2 TABLETS TWICE DAILY DAY PRIOR TO TREATMENT, DAY OF AND DAY AFTER  30 tablet  2  . gabapentin (NEURONTIN) 100 MG capsule Take 1 capsule (100 mg total) by mouth 2 times daily at 12 noon and 4 pm.  60 capsule  3  . levothyroxine (SYNTHROID, LEVOTHROID) 125 MCG tablet 1 TAB ONCE DAILY  30 tablet  5  . lidocaine-prilocaine (EMLA) cream Apply topically as needed.  30 g  0  . LORazepam (ATIVAN) 0.5 MG tablet Take 1 tablet (0.5 mg total) by mouth every 6 (six) hours as needed (Nausea or vomiting).  30 tablet  0  . lovastatin (MEVACOR) 20 MG tablet Take 20 mg by mouth at bedtime.      . magnesium chloride (SLOW-MAG) 64 MG TBEC Take 1 tablet by mouth daily.       Marland Kitchen omeprazole (PRILOSEC) 40 MG capsule Take 1 capsule (40 mg total) by mouth daily.  30 capsule  6  . ondansetron (ZOFRAN) 8 MG tablet       . oxyCODONE-acetaminophen (PERCOCET/ROXICET) 5-325 MG per tablet Take 2 tablets by mouth every 6 (six) hours as needed for pain.  60 tablet  0  . prochlorperazine (COMPAZINE) 10 MG tablet Take 10 mg by mouth every 6 (six) hours as needed.      . prochlorperazine (COMPAZINE) 25 MG suppository Place  25 mg rectally every 12 (twelve) hours as needed.      . promethazine (PHENERGAN) 25 MG tablet TAKE 1 TABLET BY MOUTH EVERY 8 HOURS AS NEEDED FOR NAUSEA  30 tablet  2  . thiamine 100 MG tablet Take 100 mg by mouth daily.        . trazodone (DESYREL) 300 MG tablet TAKE 1 TABLET (300 MG TOTAL) BY MOUTH AT BEDTIME.  30 tablet  5     ALLERGIES: Aspirin; Iodine; and Morphine and related   LABORATORY DATA:  Lab Results  Component Value Date   WBC 4.7 03/02/2012   HGB 8.9* 03/02/2012   HCT 26.8* 03/02/2012   MCV 93.7 03/02/2012   PLT 154 03/02/2012   Lab Results  Component Value Date   NA 141 03/02/2012   K 3.0* 03/02/2012   CL 106 03/02/2012   CO2 21* 03/02/2012   Lab Results  Component Value Date   ALT 18 03/02/2012   AST 28 03/02/2012   ALKPHOS 64 03/02/2012   BILITOT 0.80 03/02/2012     NARRATIVE: [name] was seen today for weekly treatment management. The chart was checked and the patient's films were reviewed. The patient  is doing well overall. She is notice some increased skin changes. A little bit of blistering. She is taking Percocet and needs a refill. This is adequately controlling her pain.  PHYSICAL EXAMINATION: weight is 209 lb 6.4 oz (94.983 kg). Her temperature is 97.7 F (36.5 C). Her blood pressure is 124/87 and her pulse is 88.      diffuse hyperpigmentation present. A little bit of blistering in the upper aspect of the breast. She is also experiencing some early dry desquamation underneath the breast.  ASSESSMENT: The patient is doing satisfactorily with treatment.  PLAN: We will continue with the patient's radiation treatment as planned. The patient was given a refill for pain medication. She will continue to use this. She will also use her current skin care regimen including Neosporin. She has 2 more treatments prior to beginning her boost radiation treatment.

## 2012-03-14 ENCOUNTER — Ambulatory Visit
Admission: RE | Admit: 2012-03-14 | Discharge: 2012-03-14 | Disposition: A | Discharge status: Home / Self Care | Payer: Medicare Other | Source: Ambulatory Visit | Attending: Radiation Oncology | Admitting: Radiation Oncology

## 2012-03-14 DIAGNOSIS — C50319 Malignant neoplasm of lower-inner quadrant of unspecified female breast: Secondary | ICD-10-CM

## 2012-03-15 ENCOUNTER — Ambulatory Visit
Admission: RE | Admit: 2012-03-15 | Discharge: 2012-03-15 | Disposition: A | Discharge status: Home / Self Care | Payer: Medicare Other | Source: Ambulatory Visit | Attending: Radiation Oncology | Admitting: Radiation Oncology

## 2012-03-15 DIAGNOSIS — C50319 Malignant neoplasm of lower-inner quadrant of unspecified female breast: Secondary | ICD-10-CM

## 2012-03-16 ENCOUNTER — Ambulatory Visit
Admission: RE | Admit: 2012-03-16 | Discharge: 2012-03-16 | Disposition: A | Discharge status: Home / Self Care | Payer: Medicare Other | Source: Ambulatory Visit | Attending: Radiation Oncology | Admitting: Radiation Oncology

## 2012-03-16 NOTE — Progress Notes (Signed)
  Radiation Oncology         [phone] ________________________________  Name: [name] MRN: [id number]  Date: 03/15/2012  DOB: [birth year]  Simulation Verification Note   NARRATIVE: The patient was brought to the treatment unit and placed in the planned treatment position. The clinical setup was verified. Then port films were obtained and uploaded to the radiation oncology medical record software.  The treatment beams were carefully compared against the planned radiation fields. The position, location, and shape of the radiation fields was reviewed. The targeted volume of tissue appears to be appropriately covered by the radiation beams. Based on my personal review, I approved the simulation verification. The patient's treatment will proceed as planned.  ________________________________   Radene Gunning, MD, PhD

## 2012-03-17 ENCOUNTER — Ambulatory Visit
Admission: RE | Admit: 2012-03-17 | Discharge: 2012-03-17 | Disposition: A | Discharge status: Home / Self Care | Payer: Medicare Other | Source: Ambulatory Visit | Attending: Radiation Oncology | Admitting: Radiation Oncology

## 2012-03-17 ENCOUNTER — Encounter: Payer: Self-pay | Admitting: Radiation Oncology

## 2012-03-17 VITALS — BP 114/75 | HR 72 | Temp 97.7°F | Resp 20 | Wt 211.5 lb

## 2012-03-17 DIAGNOSIS — C50319 Malignant neoplasm of lower-inner quadrant of unspecified female breast: Secondary | ICD-10-CM

## 2012-03-17 NOTE — Progress Notes (Signed)
Department of Radiation Oncology  Phone:  [phone] Fax:        [phone]  Weekly Treatment Note    Name: [name] Date: 03/17/2012 MRN: [id number] DOB: 02-Aug-1945   Current dose: 49 Gy  Current fraction: 27   MEDICATIONS: Current Outpatient Prescriptions  Medication Sig Dispense Refill  . Calcium Carbonate-Vitamin D (CALCIUM-VITAMIN D) 600-200 MG-UNIT CAPS Take 1 capsule by mouth daily.        . calcium gluconate 650 MG tablet Take 1 tablet (650 mg total) by mouth daily.  30 tablet  2  . cyanocobalamin (,VITAMIN B-12,) 1000 MCG/ML injection Inject 1,000 mcg into the muscle every 30 (thirty) days. Next one due the 26th of this month      . cyclobenzaprine (FLEXERIL) 5 MG tablet Take 5 mg by mouth 3 (three) times daily as needed.      Marland Kitchen dexamethasone (DECADRON) 4 MG tablet TAKE 2 TABLETS TWICE DAILY DAY PRIOR TO TREATMENT, DAY OF AND DAY AFTER  30 tablet  2  . gabapentin (NEURONTIN) 100 MG capsule Take 1 capsule (100 mg total) by mouth 2 times daily at 12 noon and 4 pm.  60 capsule  3  . levothyroxine (SYNTHROID, LEVOTHROID) 125 MCG tablet 1 TAB ONCE DAILY  30 tablet  5  . lidocaine-prilocaine (EMLA) cream Apply topically as needed.  30 g  0  . LORazepam (ATIVAN) 0.5 MG tablet Take 1 tablet (0.5 mg total) by mouth every 6 (six) hours as needed (Nausea or vomiting).  30 tablet  0  . lovastatin (MEVACOR) 20 MG tablet Take 20 mg by mouth at bedtime.      . magnesium chloride (SLOW-MAG) 64 MG TBEC Take 1 tablet by mouth daily.       Marland Kitchen omeprazole (PRILOSEC) 40 MG capsule Take 1 capsule (40 mg total) by mouth daily.  30 capsule  6  . ondansetron (ZOFRAN) 8 MG tablet       . oxyCODONE-acetaminophen (PERCOCET/ROXICET) 5-325 MG per tablet Take 2 tablets by mouth every 6 (six) hours as needed for pain.  60 tablet  0  . prochlorperazine (COMPAZINE) 10 MG tablet Take 10 mg by mouth every 6 (six) hours as needed.      . prochlorperazine (COMPAZINE) 25 MG suppository Place 25  mg rectally every 12 (twelve) hours as needed.      . promethazine (PHENERGAN) 25 MG tablet TAKE 1 TABLET BY MOUTH EVERY 8 HOURS AS NEEDED FOR NAUSEA  30 tablet  2  . thiamine 100 MG tablet Take 100 mg by mouth daily.        . trazodone (DESYREL) 300 MG tablet TAKE 1 TABLET (300 MG TOTAL) BY MOUTH AT BEDTIME.  30 tablet  5     ALLERGIES: Aspirin; Iodine; and Morphine and related   LABORATORY DATA:  Lab Results  Component Value Date   WBC 4.7 03/02/2012   HGB 8.9* 03/02/2012   HCT 26.8* 03/02/2012   MCV 93.7 03/02/2012   PLT 154 03/02/2012   Lab Results  Component Value Date   NA 141 03/02/2012   K 3.0* 03/02/2012   CL 106 03/02/2012   CO2 21* 03/02/2012   Lab Results  Component Value Date   ALT 18 03/02/2012   AST 28 03/02/2012   ALKPHOS 64 03/02/2012   BILITOT 0.80 03/02/2012     NARRATIVE: [name] was seen today for weekly treatment management. The chart was checked and the patient's films were reviewed. The patient  is doing well. Some continued skin irritation. She is using daily skin cream in addition to Neosporin. No major changes.  PHYSICAL EXAMINATION: weight is 211 lb 8 oz (95.936 kg). Her oral temperature is 97.7 F (36.5 C). Her blood pressure is 114/75 and her pulse is 72. Her respiration is 20.      diffuse hyperpigmentation/erythema. Some dry desquamation present in several areas including the upper breast in the inframammary region. No moist desquamation. No sign of infection.  ASSESSMENT: The patient is doing satisfactorily with treatment.  PLAN: We will continue with the patient's radiation treatment as planned. She will continue with her current skin care which appears adequate. She is now in her boost treatment.

## 2012-03-17 NOTE — Progress Notes (Signed)
Patient here fro weekly rad txs left breast on boost 2/8 completed, tanning , and broken skin areas under axilla and on top of breast, under inframmary fold skin has peeled, using neosporin on those areas, pain ful at times, takes pain meds for relief,eating well, drinking plenty fluids 10:00 AM

## 2012-03-18 ENCOUNTER — Ambulatory Visit
Admission: RE | Admit: 2012-03-18 | Discharge: 2012-03-18 | Disposition: A | Discharge status: Home / Self Care | Payer: Medicare Other | Source: Ambulatory Visit | Attending: Radiation Oncology | Admitting: Radiation Oncology

## 2012-03-21 ENCOUNTER — Ambulatory Visit
Admission: RE | Admit: 2012-03-21 | Discharge: 2012-03-21 | Disposition: A | Discharge status: Home / Self Care | Payer: Medicare Other | Source: Ambulatory Visit | Attending: Radiation Oncology | Admitting: Radiation Oncology

## 2012-03-22 ENCOUNTER — Ambulatory Visit
Admission: RE | Admit: 2012-03-22 | Discharge: 2012-03-22 | Disposition: A | Discharge status: Home / Self Care | Payer: Medicare Other | Source: Ambulatory Visit | Attending: Radiation Oncology | Admitting: Radiation Oncology

## 2012-03-23 ENCOUNTER — Telehealth: Payer: Self-pay | Admitting: Adult Health

## 2012-03-23 ENCOUNTER — Encounter: Payer: Self-pay | Admitting: Adult Health

## 2012-03-23 ENCOUNTER — Other Ambulatory Visit (HOSPITAL_BASED_OUTPATIENT_CLINIC_OR_DEPARTMENT_OTHER): Payer: Medicare Other | Admitting: Lab

## 2012-03-23 ENCOUNTER — Ambulatory Visit
Admission: RE | Admit: 2012-03-23 | Discharge: 2012-03-23 | Disposition: A | Discharge status: Home / Self Care | Payer: Medicare Other | Source: Ambulatory Visit | Attending: Radiation Oncology | Admitting: Radiation Oncology

## 2012-03-23 ENCOUNTER — Ambulatory Visit: Payer: Medicare Other

## 2012-03-23 ENCOUNTER — Ambulatory Visit (HOSPITAL_BASED_OUTPATIENT_CLINIC_OR_DEPARTMENT_OTHER): Payer: Medicare Other

## 2012-03-23 ENCOUNTER — Ambulatory Visit (HOSPITAL_BASED_OUTPATIENT_CLINIC_OR_DEPARTMENT_OTHER): Payer: Medicare Other | Admitting: Adult Health

## 2012-03-23 VITALS — BP 133/83 | HR 76 | Temp 97.8°F | Resp 20 | Ht 65.0 in | Wt 216.1 lb

## 2012-03-23 DIAGNOSIS — C50919 Malignant neoplasm of unspecified site of unspecified female breast: Secondary | ICD-10-CM

## 2012-03-23 DIAGNOSIS — Z23 Encounter for immunization: Secondary | ICD-10-CM

## 2012-03-23 DIAGNOSIS — T451X5A Adverse effect of antineoplastic and immunosuppressive drugs, initial encounter: Secondary | ICD-10-CM

## 2012-03-23 DIAGNOSIS — C50319 Malignant neoplasm of lower-inner quadrant of unspecified female breast: Secondary | ICD-10-CM

## 2012-03-23 DIAGNOSIS — D649 Anemia, unspecified: Secondary | ICD-10-CM

## 2012-03-23 DIAGNOSIS — M25559 Pain in unspecified hip: Secondary | ICD-10-CM

## 2012-03-23 DIAGNOSIS — Z5112 Encounter for antineoplastic immunotherapy: Secondary | ICD-10-CM

## 2012-03-23 DIAGNOSIS — E876 Hypokalemia: Secondary | ICD-10-CM

## 2012-03-23 LAB — COMPREHENSIVE METABOLIC PANEL (CC13)
AST: 35 U/L — ABNORMAL HIGH (ref 5–34)
Albumin: 3.5 g/dL (ref 3.5–5.0)
Alkaline Phosphatase: 65 U/L (ref 40–150)
Potassium: 2.9 mEq/L — ABNORMAL LOW (ref 3.5–5.1)
Sodium: 142 mEq/L (ref 136–145)
Total Bilirubin: 0.6 mg/dL (ref 0.20–1.20)
Total Protein: 6.2 g/dL — ABNORMAL LOW (ref 6.4–8.3)

## 2012-03-23 LAB — CBC WITH DIFFERENTIAL/PLATELET
BASO%: 0 % (ref 0.0–2.0)
EOS%: 1.3 % (ref 0.0–7.0)
HGB: 9 g/dL — ABNORMAL LOW (ref 11.6–15.9)
MCH: 30.9 pg (ref 25.1–34.0)
MCHC: 33.6 g/dL (ref 31.5–36.0)
RBC: 2.91 10*6/uL — ABNORMAL LOW (ref 3.70–5.45)
RDW: 13.6 % (ref 11.2–14.5)
lymph#: 0.7 10*3/uL — ABNORMAL LOW (ref 0.9–3.3)
nRBC: 0 % (ref 0–0)

## 2012-03-23 MED ORDER — ACETAMINOPHEN 325 MG PO TABS
650.0000 mg | ORAL_TABLET | Freq: Once | ORAL | Status: AC
  Administered 2012-03-23: 650 mg via ORAL

## 2012-03-23 MED ORDER — TRASTUZUMAB CHEMO INJECTION 440 MG
6.0000 mg/kg | Freq: Once | INTRAVENOUS | Status: AC
  Administered 2012-03-23: 567 mg via INTRAVENOUS
  Filled 2012-03-23: qty 27

## 2012-03-23 MED ORDER — SODIUM CHLORIDE 0.9 % IV SOLN: 250.0000 mL | Freq: Once | INTRAVENOUS | Status: DC

## 2012-03-23 MED ORDER — SODIUM CHLORIDE 0.9 % IV SOLN
Freq: Once | INTRAVENOUS | Status: AC
  Administered 2012-03-23: 20 mL via INTRAVENOUS

## 2012-03-23 MED ORDER — SODIUM CHLORIDE 0.9 % IJ SOLN
10.0000 mL | INTRAMUSCULAR | Status: DC | PRN
  Administered 2012-03-23: 10 mL
  Filled 2012-03-23: qty 10

## 2012-03-23 MED ORDER — INFLUENZA VIRUS VACC SPLIT PF IM SUSP
0.5000 mL | Freq: Once | INTRAMUSCULAR | Status: AC
  Administered 2012-03-23: 0.5 mL via INTRAMUSCULAR
  Filled 2012-03-23: qty 0.5

## 2012-03-23 MED ORDER — HEPARIN SOD (PORK) LOCK FLUSH 100 UNIT/ML IV SOLN
500.0000 [IU] | Freq: Once | INTRAVENOUS | Status: AC | PRN
  Administered 2012-03-23: 500 [IU]
  Filled 2012-03-23: qty 5

## 2012-03-23 MED ORDER — POTASSIUM CHLORIDE CRYS ER 20 MEQ PO TBCR: 20.0000 meq | EXTENDED_RELEASE_TABLET | Freq: Every day | ORAL | Status: DC

## 2012-03-23 MED ORDER — DIPHENHYDRAMINE HCL 25 MG PO CAPS
50.0000 mg | ORAL_CAPSULE | Freq: Once | ORAL | Status: AC
  Administered 2012-03-23: 50 mg via ORAL

## 2012-03-23 NOTE — Patient Instructions (Addendum)
Doing well.  Proceed with Herceptin.  I have ordered your flu shot.

## 2012-03-23 NOTE — Telephone Encounter (Signed)
Spoke to patient about their hypokalemia.  Called in prescription for PO KDur daily.

## 2012-03-23 NOTE — Progress Notes (Signed)
OFFICE PROGRESS NOTE  CC  [name], MD [address] [facility] 4th Floor [place] [address] Dr. Claud Kelp  DIAGNOSIS: 40 year ol female with new diagnosis of stage I ER-/PR-/Her2Neu positive invasive ductal breast cancer  PRIOR THERAPY: 1. 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 ER negative PR negative Her2 Neu positive with Ki -67 53%,   2. S/P porta cath palcement for chemotherapy  #3 patient has begun her adjuvant chemotherapy consisting of Taxotere carboplatinum and Herceptin. Her treatment began in May 2013. A total of 4 cycles of Taxotere carboplatinum and Herceptin combination are planned. Once she completes this she will then proceed to radiation therapy with concomitant Herceptin to be given every 3 weeks to finish out a year of treatment.  CURRENT THERAPY: Here for Herceptin  INTERVAL HISTORY: [name] 66 y.[name]. female returns for follow up.  [name] is feeling well today.  She is here for her next Herceptin treatment.  She is tolerating it well.  She will have an interim echo on 10/28.  She does notice some black "spots" occasionally when looking around.  Her eye exam is due, and she states she will schedule it soon.  Otherwise she is feeling well and w/[name] complaints.    MEDICAL HISTORY: Past Medical History  Diagnosis Date  . Obesity   . PUD (peptic ulcer disease)   . Leukopenia   . Abdominal pain   . Short bowel syndrome   . Internal hemorrhoids without mention of complication   . Stricture and stenosis of esophagus   . Osteoarthrosis, unspecified whether generalized or localized, unspecified site   . Thyrotoxicosis without mention of goiter or other cause, without mention of thyrotoxic crisis or storm   . Other and unspecified hyperlipidemia   . Gout, unspecified   . Diverticulosis of colon (without mention of hemorrhage)   . C. difficile colitis   . VITAMIN B12  DEFICIENCY 08/30/2009  . GOITER, MULTINODULAR 04/02/2009  . HYPOTHYROIDISM, POST-RADIATION 08/13/2009  . ASYMPTOMATIC POSTMENOPAUSAL STATUS 10/11/2008  . Esophageal reflux 06/12/2008  . PONV (postoperative nausea and vomiting)   . Varicose veins   . Type II or unspecified type diabetes mellitus without mention of complication, not stated as uncontrolled     no med in years diet controled  . Blood transfusion   . UTI (urinary tract infection)   . Kidney stones     "several"  . Unspecified essential hypertension   . Angina   . Shortness of breath on exertion     "sometimes"  . Anemia   . ANEMIA, IRON DEFICIENCY 05/08/2009  . History of lower GI bleeding   . H/[name] hiatal hernia   . Migraines   . Breast cancer 09/29/11    invasive grade III ductal ca,assoc high grade dcis,ER/PR=neg    ALLERGIES:  is allergic to aspirin; iodine; and morphine and related.  MEDICATIONS:  Current Outpatient Prescriptions  Medication Sig Dispense Refill  . Calcium Carbonate-Vitamin D (CALCIUM-VITAMIN D) 600-200 MG-UNIT CAPS Take 1 capsule by mouth daily.        . calcium gluconate 650 MG tablet Take 1 tablet (650 mg total) by mouth daily.  30 tablet  2  . cyanocobalamin (,VITAMIN B-12,) 1000 MCG/ML injection Inject 1,000 mcg into the muscle every 30 (thirty) days. Next one due the 26th of this month      . cyclobenzaprine (FLEXERIL) 5 MG tablet Take  5 mg by mouth 3 (three) times daily as needed.      Marland Kitchen dexamethasone (DECADRON) 4 MG tablet TAKE 2 TABLETS TWICE DAILY DAY PRIOR TO TREATMENT, DAY OF AND DAY AFTER  30 tablet  2  . gabapentin (NEURONTIN) 100 MG capsule Take 1 capsule (100 mg total) by mouth 2 times daily at 12 noon and 4 pm.  60 capsule  3  . levothyroxine (SYNTHROID, LEVOTHROID) 125 MCG tablet 1 TAB ONCE DAILY  30 tablet  5  . lidocaine-prilocaine (EMLA) cream Apply topically as needed.  30 g  0  . LORazepam (ATIVAN) 0.5 MG tablet Take 1 tablet (0.5 mg total) by mouth every 6 (six) hours as needed  (Nausea or vomiting).  30 tablet  0  . lovastatin (MEVACOR) 20 MG tablet Take 20 mg by mouth at bedtime.      . magnesium chloride (SLOW-MAG) 64 MG TBEC Take 1 tablet by mouth daily.       Marland Kitchen omeprazole (PRILOSEC) 40 MG capsule Take 1 capsule (40 mg total) by mouth daily.  30 capsule  6  . ondansetron (ZOFRAN) 8 MG tablet       . oxyCODONE-acetaminophen (PERCOCET/ROXICET) 5-325 MG per tablet Take 2 tablets by mouth every 6 (six) hours as needed for pain.  60 tablet  0  . prochlorperazine (COMPAZINE) 10 MG tablet Take 10 mg by mouth every 6 (six) hours as needed.      . prochlorperazine (COMPAZINE) 25 MG suppository Place 25 mg rectally every 12 (twelve) hours as needed.      . promethazine (PHENERGAN) 25 MG tablet TAKE 1 TABLET BY MOUTH EVERY 8 HOURS AS NEEDED FOR NAUSEA  30 tablet  2  . thiamine 100 MG tablet Take 100 mg by mouth daily.        . trazodone (DESYREL) 300 MG tablet TAKE 1 TABLET (300 MG TOTAL) BY MOUTH AT BEDTIME.  30 tablet  5   Current Facility-Administered Medications  Medication Dose Route Frequency Provider Last Rate Last Dose  . influenza  inactive virus vaccine (FLUZONE/FLUARIX) injection 0.5 mL  0.5 mL Intramuscular Once Augustin Schooling, NP       Facility-Administered Medications Ordered in Other Visits  Medication Dose Route Frequency Provider Last Rate Last Dose  . 0.9 %  sodium chloride infusion   Intravenous Once Victorino December, MD      . acetaminophen (TYLENOL) tablet 650 mg  650 mg Oral Once Victorino December, MD      . diphenhydrAMINE (BENADRYL) capsule 50 mg  50 mg Oral Once Victorino December, MD      . heparin lock flush 100 unit/mL  500 Units Intracatheter Once PRN Victorino December, MD      . sodium chloride 0.9 % injection 10 mL  10 mL Intracatheter PRN Victorino December, MD      . trastuzumab (HERCEPTIN) 567 mg in sodium chloride 0.9 % 250 mL chemo infusion  6 mg/kg (Treatment Plan Actual) Intravenous Once Victorino December, MD        SURGICAL HISTORY:  Past Surgical  History  Procedure Date  . Vein ligation and stripping 1980's    Right leg  . Lithotripsy     "4 or 5 times"  . Bunionectomy 1970's    bilateral  . Colonoscopy   . Esophagogastroduodenoscopy 07/15/2005  . Thyroid ultrasound 12/1994 and 12/1995  . Mastectomy w/ nodes partial 09/29/11    left  . Port a cath placement 09/29/11  right chest  . Breast surgery   . Cholecystectomy 1990's  . Abdominal hysterectomy 1970's    with BSO  . Dilation and curettage of uterus   . Colon surgery     "several surgeries for short bowel syndrome"  . Abdominal adhesion surgery 1980's thru 1990's    "several"  . Kidney stone surgery 1990's    "tried to go up & get it but pushed it further up"  . Portacath placement 09/29/2011    Procedure: INSERTION PORT-A-CATH;  Surgeon: Ernestene Mention, MD;  [facility];  Service: General;  Laterality: N/A;  . Breast biopsy 08/13/11    left breast lower inner quadrant  . Breast lumpectomy w/ needle localization 09/29/11    left  breast=lymph node,excision benign/ ER/PR=neg, her 2 Positive    REVIEW OF SYSTEMS:   General: fatigue (-), night sweats (-), fever (-), pain (-) Lymph: palpable nodes (-) HEENT: vision changes (-), mucositis (-), gum bleeding (-), epistaxis (-) Cardiovascular: chest pain (-), palpitations (-) Pulmonary: shortness of breath (-), dyspnea on exertion (-), cough (-), hemoptysis (-) GI:  Early satiety (-), melena (-), dysphagia (-), nausea/vomiting (-), diarrhea (-) GU: dysuria (-), hematuria (-), incontinence (-) Musculoskeletal: joint swelling (-), joint pain (-), back pain (-) Neuro: weakness (-), numbness (-), headache (-), confusion (-) Skin: Rash (-), lesions (-), dryness (-) Psych: depression (-), suicidal/homicidal ideation (-), feeling of hopelessness (-)   PHYSICAL EXAMINATION:  BP 133/83  Pulse 76  Temp 97.8 F (36.6 C) (Oral)  Resp 20  Ht 5\' 5"  (1.651 m)  Wt 216 lb 1.6 oz (98.022 kg)  BMI 35.96 kg/m2 General: Patient  is a well appearing female in no acute distress HEENT: PERRLA, sclerae anicteric no conjunctival pallor, MMM Neck: supple, no palpable adenopathy Lungs: clear to auscultation bilaterally, no wheezes, rhonchi, or rales Cardiovascular: regular rate rhythm, S1, S2, no murmurs, rubs or gallops Abdomen: Soft, non-tender, non-distended, normoactive bowel sounds, no HSM Extremities: warm and well perfused, no clubbing, cyanosis, or edema Skin: No rashes or lesions Neuro: Non-focal Bilateral Breast Exam: left breast healing incision scar, with some tenderness, and radiation skin changes, no evidence of infections.Right breast no masses or nipple discharge ECOG PERFORMANCE STATUS: 1 - Symptomatic but completely ambulatory  LABORATORY DATA: Lab Results  Component Value Date   WBC 3.1* 03/23/2012   HGB 9.0* 03/23/2012   HCT 26.8* 03/23/2012   MCV 92.1 03/23/2012   PLT 144* 03/23/2012      Chemistry      Component Value Date/Time   NA 141 03/02/2012 1258   NA 141 01/20/2012 1139   K 3.0* 03/02/2012 1258   K 3.0* 01/20/2012 1139   CL 106 03/02/2012 1258   CL 103 01/20/2012 1139   CO2 21* 03/02/2012 1258   CO2 23 01/20/2012 1139   BUN 14.0 03/02/2012 1258   BUN 14 01/20/2012 1139   CREATININE 0.9 03/02/2012 1258   CREATININE 0.86 01/20/2012 1139   CREATININE 0.88 03/31/2011 1550      Component Value Date/Time   CALCIUM 7.7* 03/02/2012 1258   CALCIUM 6.7* 01/20/2012 1139   CALCIUM 9.5 05/15/2010 2153   ALKPHOS 64 03/02/2012 1258   ALKPHOS 105 01/20/2012 1139   AST 28 03/02/2012 1258   AST 24 01/20/2012 1139   ALT 18 03/02/2012 1258   ALT 13 01/20/2012 1139   BILITOT 0.80 03/02/2012 1258   BILITOT 0.8 01/20/2012 1139     ADDITIONAL INFORMATION: 1. PROGNOSTIC INDICATORS - ACIS Results  IMMUNOHISTOCHEMICAL AND MORPHOMETRIC ANALYSIS BY THE AUTOMATED CELLULAR IMAGING SYSTEM (ACIS) Estrogen Receptor (Negative, <1%): 0%, NEGATIVE Progesterone Receptor (Negative, <1%): 0%, NEGATIVE COMMENT: The  negative hormone receptor study(ies) in this case have an internal positive control. All controls stained appropriately Abigail Miyamoto MD Pathologist, Electronic Signature ( Signed 10/07/2011) FINAL DIAGNOSIS 1 of 4 FINAL for Sill, [name] ([id number]) Diagnosis 1. Breast, lumpectomy, Left - INVASIVE GRADE III, DUCTAL CARCINOMA, SPANNING 0.7 CM. - ASSOCIATED HIGH GRADED DUCTAL CARCINOMA IN SITU. - LYMPH/VASCULAR INVASION NOT IDENTIFIED. - MARGINS ARE NEGATIVE. - SEE ONCOLOGY TEMPLATE. 2. Lymph node, sentinel, biopsy, Left axillary#1 - ONE BENIGN LYMPH NODE WITH NO TUMOR (0/1). - BENIGN GLANDULAR EPITHELIAL INCLUSIONS PRESENT. - SEE COMMENT. 3. Breast, excision, Posterior margin - BENIGN BREAST PARENCHYMA. - NO ATYPIA, HYPERPLASIA, OR MALIGNANCY IDENTIFIED. 4. Lymph node, sentinel, biopsy, Left axillary #2 - ONE BENIGN LYMPH NODE WITH NO TUMOR SEEN (0/1). Microscopic Comment 1. BREAST, INVASIVE TUMOR, WITH LYMPH NODE SAMPLING Specimen, including laterality: Left breast with posterior margin and sentinel lymph nodes. Procedure: Left breast lumpectomy with posterior margin excision and sentinel lymph node biopsies. Grade: III. Tubule formation: 3. Nuclear pleomorphism: 3. Mitotic:3. Tumor size (gross measurement and glass slide measurement): 0.7 cm. Margins: Invasive, distance to closest margin: At least 0.8 cm. In-situ, distance to closest margin: At least 0.8 cm. Lymphovascular invasion: Not identified. Ductal carcinoma in situ: Yes. Grade: High grade. Extensive intraductal component: No. Lobular neoplasia: No. Tumor focality: Unifocal. Treatment effect: N/A. Extent of tumor: Confined to breast parenchyma. Lymph nodes: # examined: 2. Lymph nodes with metastasis: 0. Breast prognostic profile: Performed on previous case ([id number]) Estrogen receptor: 0%, negative. Progesterone receptor: 0%, negative. Her 2 neu: 3.09, amplified. Ki-67: 53%. Non-neoplastic  breast: Fat necrosis present. TNM: pT1b, pN0, MX. Comments: An estrogen receptor and progesterone receptor will be repeated on the current tumor and reported in an addendum. (RAH:gt, 10/01/11) 2. The left sentinel axillary lymph node #1 shows benign glandular epithelial inclusions. Some of the inclusions are ciliated. The nuclei are bland appearing and are not malignant. Smooth muscle myosin, p63 and calponin immunohistochemical stains are performed which do not show a myoepithelial layer. 2 of 4 FINAL for Hanton, HANI PATNODE ([id number]) Microscopic Comment(continued) Although this is the case, the inclusions are benign and do not represent metastatic carcinoma. Both Dr. Frederica Kuster and Dr. Colonel Bald have seen the left sentinel axillary lymph node in consultation with agreement that the inclusions are benign and do not represent metastatic carcinoma. Zandra Abts MD Pathologist, Electronic Signature (Case signed 10/02/2011) Specimen Gross and Clinical Information Specimen(s) Obtained: 1. Breast, lumpectomy, Left 2. Lymph node, sentinel, biopsy, Left axillary#1 3. Breast, excision, Posterior margin 4. Lymph node, sentinel, biopsy, Left axillary #2 Specimen Clinical  RADIOGRAPHIC STUDIES:  ASSESSMENT: 66 year old with   1. 0.7 cm high grade invasive ductal carcinoma that is ER-, PR- Her2Neu +, sentinel node negative (T1bN0) pathologic stage I.Because patient is HER-2 positive she is receiving adjuvant chemotherapy and Herceptin. She is going to be receiving 4 cycles of TCH combination and then Herceptin alone to complete out 1years worth of treatment.  2.Receiving radiation therapy  3. Left hip pain and left leg swelling patient at this time does not want to have her hip replacement surgery she wants to wait until she completes all of her Herceptin.  4. Anemia due to chemotherapy   PLAN:   #1 [name] will receive her next dose of Herceptin today.  She is scheduled for her next echo  on  10/28.  She will also receive a flu shot today.  #2 She will continue with radiation therapy until approximately October 25.  #3 she will return in 3 weeks' time for next dose of Herceptin. Her anticipated Herceptin completion is May 2014.   All questions were answered. The patient knows to call the clinic with any problems, questions or concerns. We can certainly see the patient much sooner if necessary.  I spent 15 minutes counseling the patient face to face. The total time spent in the appointment was 30 minutes.  Cherie Ouch Lyn Hollingshead, NP Medical Oncology [facility] Phone: [phone]  03/23/2012, 10:44 AM

## 2012-03-24 ENCOUNTER — Ambulatory Visit: Payer: Medicare Other

## 2012-03-24 ENCOUNTER — Ambulatory Visit
Admission: RE | Admit: 2012-03-24 | Discharge: 2012-03-24 | Disposition: A | Discharge status: Home / Self Care | Payer: Medicare Other | Source: Ambulatory Visit | Attending: Radiation Oncology | Admitting: Radiation Oncology

## 2012-03-25 ENCOUNTER — Ambulatory Visit: Payer: Medicare Other

## 2012-03-25 ENCOUNTER — Ambulatory Visit
Admission: RE | Admit: 2012-03-25 | Discharge: 2012-03-25 | Disposition: A | Discharge status: Home / Self Care | Payer: Medicare Other | Source: Ambulatory Visit | Attending: Radiation Oncology | Admitting: Radiation Oncology

## 2012-03-25 ENCOUNTER — Encounter: Payer: Self-pay | Admitting: Radiation Oncology

## 2012-03-25 VITALS — BP 143/77 | HR 75 | Temp 97.9°F | Resp 20 | Wt 217.0 lb

## 2012-03-25 DIAGNOSIS — C50319 Malignant neoplasm of lower-inner quadrant of unspecified female breast: Secondary | ICD-10-CM

## 2012-03-25 MED ORDER — OXYCODONE-ACETAMINOPHEN 5-325 MG PO TABS: 1.0000 | ORAL_TABLET | Freq: Four times a day (QID) | ORAL | Status: DC | PRN

## 2012-03-25 NOTE — Progress Notes (Signed)
Pt completed tx today to left breast. Applying Radiaplex, Neosporin to very tiny area of moist desquamation on upper left breast; she states "it is getting better". Pt fatigued at times, pain in left foot related to arthritis and the need for knee replacement. Pt requesting refill on Oxycodone today. Gave pt FU card.

## 2012-03-25 NOTE — Progress Notes (Signed)
Department of Radiation Oncology  Phone:  [phone] Fax:        [phone]  Weekly Treatment Note    Name: [name] Date: 03/25/2012 MRN: [id number] DOB: 27-Jul-1945   Current dose: 60.1 Gy  Current fraction: 33   MEDICATIONS: Current Outpatient Prescriptions  Medication Sig Dispense Refill  . Calcium Carbonate-Vitamin D (CALCIUM-VITAMIN D) 600-200 MG-UNIT CAPS Take 1 capsule by mouth daily.        . calcium gluconate 650 MG tablet Take 1 tablet (650 mg total) by mouth daily.  30 tablet  2  . cyanocobalamin (,VITAMIN B-12,) 1000 MCG/ML injection Inject 1,000 mcg into the muscle every 30 (thirty) days. Next one due the 26th of this month      . cyclobenzaprine (FLEXERIL) 5 MG tablet Take 5 mg by mouth 3 (three) times daily as needed.      Marland Kitchen dexamethasone (DECADRON) 4 MG tablet TAKE 2 TABLETS TWICE DAILY DAY PRIOR TO TREATMENT, DAY OF AND DAY AFTER  30 tablet  2  . gabapentin (NEURONTIN) 100 MG capsule Take 1 capsule (100 mg total) by mouth 2 times daily at 12 noon and 4 pm.  60 capsule  3  . levothyroxine (SYNTHROID, LEVOTHROID) 125 MCG tablet 1 TAB ONCE DAILY  30 tablet  5  . lidocaine-prilocaine (EMLA) cream Apply topically as needed.  30 g  0  . LORazepam (ATIVAN) 0.5 MG tablet Take 1 tablet (0.5 mg total) by mouth every 6 (six) hours as needed (Nausea or vomiting).  30 tablet  0  . lovastatin (MEVACOR) 20 MG tablet Take 20 mg by mouth at bedtime.      . magnesium chloride (SLOW-MAG) 64 MG TBEC Take 1 tablet by mouth daily.       Marland Kitchen omeprazole (PRILOSEC) 40 MG capsule Take 1 capsule (40 mg total) by mouth daily.  30 capsule  6  . ondansetron (ZOFRAN) 8 MG tablet       . oxyCODONE-acetaminophen (PERCOCET/ROXICET) 5-325 MG per tablet Take 2 tablets by mouth every 6 (six) hours as needed for pain.  60 tablet  0  . potassium chloride SA (K-DUR,KLOR-CON) 20 MEQ tablet Take 1 tablet (20 mEq total) by mouth daily.  14 tablet  3  . prochlorperazine (COMPAZINE) 10 MG  tablet Take 10 mg by mouth every 6 (six) hours as needed.      . prochlorperazine (COMPAZINE) 25 MG suppository Place 25 mg rectally every 12 (twelve) hours as needed.      . promethazine (PHENERGAN) 25 MG tablet TAKE 1 TABLET BY MOUTH EVERY 8 HOURS AS NEEDED FOR NAUSEA  30 tablet  2  . thiamine 100 MG tablet Take 100 mg by mouth daily.        . trazodone (DESYREL) 300 MG tablet TAKE 1 TABLET (300 MG TOTAL) BY MOUTH AT BEDTIME.  30 tablet  5     ALLERGIES: Aspirin; Iodine; and Morphine and related   LABORATORY DATA:  Lab Results  Component Value Date   WBC 3.1* 03/23/2012   HGB 9.0* 03/23/2012   HCT 26.8* 03/23/2012   MCV 92.1 03/23/2012   PLT 144* 03/23/2012   Lab Results  Component Value Date   NA 142 03/23/2012   K 2.9* 03/23/2012   CL 106 03/23/2012   CO2 23 03/23/2012   Lab Results  Component Value Date   ALT 35 03/23/2012   AST 35* 03/23/2012   ALKPHOS 65 03/23/2012   BILITOT 0.60 03/23/2012  NARRATIVE: [name] was seen today for weekly treatment management. The chart was checked and the patient's films were reviewed. The patient finished her final fraction today. She feels that her skin has continued to heal. Some diarrhea began last night. She has not begun Imodium as of yet.  PHYSICAL EXAMINATION: weight is 217 lb (98.431 kg). Her oral temperature is 97.9 F (36.6 C). Her blood pressure is 143/77 and her pulse is 75. Her respiration is 20.      healing desquamation present. The inframammary region and the axillary region looked quite good.  ASSESSMENT: The patient did satisfactorily with treatment. Some expected acute toxicity in terms of skin irritation.  PLAN: Followup in one month. She will continue to use skin cream for 2 weeks at least.

## 2012-03-28 ENCOUNTER — Ambulatory Visit (HOSPITAL_BASED_OUTPATIENT_CLINIC_OR_DEPARTMENT_OTHER)
Admission: RE | Admit: 2012-03-28 | Discharge: 2012-03-28 | Disposition: A | Discharge status: Home / Self Care | Payer: Medicare Other | Source: Ambulatory Visit | Attending: Internal Medicine | Admitting: Internal Medicine

## 2012-03-28 ENCOUNTER — Ambulatory Visit: Payer: Medicare Other

## 2012-03-28 ENCOUNTER — Ambulatory Visit (HOSPITAL_COMMUNITY)
Admission: RE | Admit: 2012-03-28 | Discharge: 2012-03-28 | Disposition: A | Discharge status: Home / Self Care | Payer: Medicare Other | Source: Ambulatory Visit | Attending: Internal Medicine | Admitting: Internal Medicine

## 2012-03-28 ENCOUNTER — Encounter (HOSPITAL_COMMUNITY): Payer: Self-pay

## 2012-03-28 VITALS — BP 128/86 | HR 80 | Wt 214.8 lb

## 2012-03-28 DIAGNOSIS — I369 Nonrheumatic tricuspid valve disorder, unspecified: Secondary | ICD-10-CM | POA: Insufficient documentation

## 2012-03-28 DIAGNOSIS — I379 Nonrheumatic pulmonary valve disorder, unspecified: Secondary | ICD-10-CM | POA: Insufficient documentation

## 2012-03-28 DIAGNOSIS — Z8249 Family history of ischemic heart disease and other diseases of the circulatory system: Secondary | ICD-10-CM | POA: Insufficient documentation

## 2012-03-28 DIAGNOSIS — I080 Rheumatic disorders of both mitral and aortic valves: Secondary | ICD-10-CM | POA: Insufficient documentation

## 2012-03-28 DIAGNOSIS — C50319 Malignant neoplasm of lower-inner quadrant of unspecified female breast: Secondary | ICD-10-CM

## 2012-03-28 DIAGNOSIS — I059 Rheumatic mitral valve disease, unspecified: Secondary | ICD-10-CM

## 2012-03-28 DIAGNOSIS — C50919 Malignant neoplasm of unspecified site of unspecified female breast: Secondary | ICD-10-CM

## 2012-03-28 MED ORDER — SPIRONOLACTONE 25 MG PO TABS: 25.0000 mg | ORAL_TABLET | Freq: Every day | ORAL | Status: DC

## 2012-03-28 NOTE — Progress Notes (Signed)
  Echocardiogram 2D Echocardiogram has been performed.  [name] 03/28/2012, 10:01 AM

## 2012-03-28 NOTE — Progress Notes (Signed)
Patient ID: [name], female   DOB: 1945/06/15, 66 y.o.   MRN: [id number] PCP: [name] Oncologist: [name]  HPI:   [name] is a 66 year old female with h/o obesity, short bowel syndrome, diet-controlled DM2, CP s/p multiple Myoviews Last in 11/12 (EF 61% normal perfusion). Referred by [name] for enrollment into cardio-oncology clinic.   Diagnosed May 2013 : stage I ER-/PR-/Her2Neu positive invasive ductal breast cancer  PRIOR THERAPY:  1. 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 ER negative PR negative Her2 Neu positive with Ki -67 53%,  2. S/P porta cath palcement for chemotherapy  #3 patient has begun her adjuvant chemotherapy consisting of Taxotere carboplatinum and Herceptin. Her treatment began in May 2013. A total of 4 cycles of Taxotere carboplatinum and Herceptin combination are planned. Once she completes this she will then proceed to radiation therapy with concomitant Herceptin to be given every 3 weeks to finish out a year of treatment. Completion target May 2014.    Echo: 11/12 EF 50-55% lateral s' 8.4 (second peak) Echo: 4/13 EF 45-50% lateral s' 8.5 (second peak - 1st peak not seen) Echo:12/24/11  EF 50-55% lateral s' 8.7 ECHO: 03/28/12 EF 55% lateral S' unreadable but does not appear depressed. MR mild  She returns for follow up. She started on potassium last week (K 2.9). She has frequent diarrhea.due IBS. SOB with exertion but says it comes and goes. Dyspnea with inclines surfaces.  Denies PND/Orthopnea. Completed 2 months of Hercpetin. Mild  lower extremity edema.     Past Medical History  Diagnosis Date  . Obesity   . PUD (peptic ulcer disease)   . Leukopenia   . Abdominal pain   . Short bowel syndrome   . Internal hemorrhoids without mention of complication   . Stricture and stenosis of esophagus   . Osteoarthrosis, unspecified whether generalized or localized,  unspecified site   . Thyrotoxicosis without mention of goiter or other cause, without mention of thyrotoxic crisis or storm   . Other and unspecified hyperlipidemia   . Gout, unspecified   . Diverticulosis of colon (without mention of hemorrhage)   . C. difficile colitis   . VITAMIN B12 DEFICIENCY 08/30/2009  . GOITER, MULTINODULAR 04/02/2009  . HYPOTHYROIDISM, POST-RADIATION 08/13/2009  . ASYMPTOMATIC POSTMENOPAUSAL STATUS 10/11/2008  . Esophageal reflux 06/12/2008  . PONV (postoperative nausea and vomiting)   . Varicose veins   . Type II or unspecified type diabetes mellitus without mention of complication, not stated as uncontrolled     no med in years diet controled  . Blood transfusion   . UTI (urinary tract infection)   . Kidney stones     "several"  . Unspecified essential hypertension   . Angina   . Shortness of breath on exertion     "sometimes"  . Anemia   . ANEMIA, IRON DEFICIENCY 05/08/2009  . History of lower GI bleeding   . H/O hiatal hernia   . Migraines   . Breast cancer 09/29/11    invasive grade III ductal ca,assoc high grade dcis,ER/PR=neg    Current Outpatient Prescriptions  Medication Sig Dispense Refill  . Calcium Carbonate-Vitamin D (CALCIUM-VITAMIN D) 600-200 MG-UNIT CAPS Take 1 capsule by mouth daily.        . calcium gluconate 650 MG tablet Take 1 tablet (650 mg total) by mouth daily.  30 tablet  2  . cyanocobalamin (,VITAMIN  B-12,) 1000 MCG/ML injection Inject 1,000 mcg into the muscle every 30 (thirty) days. Next one due the 26th of this month      . cyclobenzaprine (FLEXERIL) 5 MG tablet Take 5 mg by mouth 3 (three) times daily as needed.      Marland Kitchen dexamethasone (DECADRON) 4 MG tablet TAKE 2 TABLETS TWICE DAILY DAY PRIOR TO TREATMENT, DAY OF AND DAY AFTER  30 tablet  2  . gabapentin (NEURONTIN) 100 MG capsule Take 1 capsule (100 mg total) by mouth 2 times daily at 12 noon and 4 pm.  60 capsule  3  . levothyroxine (SYNTHROID, LEVOTHROID) 125 MCG tablet 1 TAB  ONCE DAILY  30 tablet  5  . lidocaine-prilocaine (EMLA) cream Apply topically as needed.  30 g  0  . LORazepam (ATIVAN) 0.5 MG tablet Take 1 tablet (0.5 mg total) by mouth every 6 (six) hours as needed (Nausea or vomiting).  30 tablet  0  . lovastatin (MEVACOR) 20 MG tablet Take 20 mg by mouth at bedtime.      . magnesium chloride (SLOW-MAG) 64 MG TBEC Take 1 tablet by mouth daily.       Marland Kitchen omeprazole (PRILOSEC) 40 MG capsule Take 1 capsule (40 mg total) by mouth daily.  30 capsule  6  . ondansetron (ZOFRAN) 8 MG tablet       . oxyCODONE-acetaminophen (PERCOCET/ROXICET) 5-325 MG per tablet Take 1-2 tablets by mouth every 6 (six) hours as needed for pain.  60 tablet  0  . potassium chloride SA (K-DUR,KLOR-CON) 20 MEQ tablet Take 1 tablet (20 mEq total) by mouth daily.  14 tablet  3  . prochlorperazine (COMPAZINE) 10 MG tablet Take 10 mg by mouth every 6 (six) hours as needed.      . prochlorperazine (COMPAZINE) 25 MG suppository Place 25 mg rectally every 12 (twelve) hours as needed.      . promethazine (PHENERGAN) 25 MG tablet TAKE 1 TABLET BY MOUTH EVERY 8 HOURS AS NEEDED FOR NAUSEA  30 tablet  2  . thiamine 100 MG tablet Take 100 mg by mouth daily.        . trazodone (DESYREL) 300 MG tablet TAKE 1 TABLET (300 MG TOTAL) BY MOUTH AT BEDTIME.  30 tablet  5     Allergies  Allergen Reactions  . Aspirin Other (See Comments)    REACTION: Gi Intolerance/ Burning in stomach  . Iodine Itching    Allergic to IVP dye  . Morphine And Related Itching    History   Social History  . Marital Status: Married    Spouse Name: N/A    Number of Children: N/A  . Years of Education: N/A   Occupational History  . Not on file.   Social History Main Topics  . Smoking status: Former Smoker -- 1.0 packs/day for 10 years    Types: Cigarettes    Quit date: 09/22/1985  . Smokeless tobacco: Never Used  . Alcohol Use: Yes     09/29/11 "used to drink socially years ago"  . Drug Use: No  . Sexually Active:  No     HRT x many yrs   Other Topics Concern  . Not on file   Social History Narrative   Pt gets regular exercise    Family History  Problem Relation Age of Onset  . Esophageal cancer Son     deceased  . Cancer Son     Esophageal Cancer  . Diabetes Mother   .  Heart disease Mother   . Colon cancer Paternal Uncle   . Cancer Paternal Uncle     Colon Cancer  . Kidney disease Sister   . Diabetes Father   . Hypertension Father   . Kidney disease Brother   . Kidney disease Brother   . Kidney disease Brother     PHYSICAL EXAM: Filed Vitals:   03/28/12 1020  BP: 128/86  Pulse: 80   General:  Well appearing. No respiratory difficulty (husband present) HEENT: normal Neck: supple. no JVD. Carotids 2+ bilat; no bruits. No lymphadenopathy or thryomegaly appreciated. Porta cath in place Cor: PMI nondisplaced. Regular rate & rhythm. No rubs, gallops or murmurs. Lungs: clear Abdomen: obese soft, nontender, nondistended. No hepatosplenomegaly. No bruits or masses. Good bowel sounds. Extremities: no cyanosis, clubbing, rash, trace lower extremity edema Neuro: alert & oriented x 3, cranial nerves grossly intact. moves [name] 4 extremities w/o difficulty. Affect pleasant.   ASSESSMENT & PLAN:

## 2012-03-28 NOTE — Patient Instructions (Addendum)
Follow up in 3 months with an ECHO and Dr Gala Romney  Take Spironolactone 25 mg daily ( Start on Wednesday)  Stop potassium after you start Spinolactone

## 2012-03-28 NOTE — Assessment & Plan Note (Addendum)
Reviewed and discussed ECHO today with [name] and her husband. EF and lateral S' stable. No evidence of cardio toxicity. Reviewed potassium trends. Add Spironolactone 25 mg to assist with lower extremity edema and hypokalemia. She was instructed to stop potassium after she starts spironolactone.  Repeat BMET 04/08/12. Follow up in 3 months with an ECHO.   Patient seen and examined with Tonye Becket, NP. We discussed all aspects of the encounter. I agree with the assessment and plan as stated above. I reviewed echos personally. Lateral s' hard to evaluate. EF and Doppler parameters stable. No HF on exam. Continue Herceptin. Will add spironolactone for help with volume management

## 2012-03-31 ENCOUNTER — Ambulatory Visit (INDEPENDENT_AMBULATORY_CARE_PROVIDER_SITE_OTHER): Payer: Medicare Other | Admitting: Endocrinology

## 2012-03-31 ENCOUNTER — Encounter: Payer: Self-pay | Admitting: Endocrinology

## 2012-03-31 ENCOUNTER — Ambulatory Visit: Payer: Medicare Other

## 2012-03-31 VITALS — BP 118/78 | HR 86 | Temp 98.3°F | Resp 12 | Wt 209.8 lb

## 2012-03-31 DIAGNOSIS — R197 Diarrhea, unspecified: Secondary | ICD-10-CM

## 2012-03-31 DIAGNOSIS — D649 Anemia, unspecified: Secondary | ICD-10-CM

## 2012-03-31 MED ORDER — CYANOCOBALAMIN 1000 MCG/ML IJ SOLN: 1000.0000 ug | Freq: Once | INTRAMUSCULAR | Status: DC

## 2012-03-31 NOTE — Progress Notes (Signed)
Subjective:    Patient ID: [name], female    DOB: August 16, 1945, 66 y.o.   MRN: [id number]  HPI Pt states 1 day of mild cramps throughout the abdomen, and assoc diarrhea, but no brbpr.   Past Medical History  Diagnosis Date  . Obesity   . PUD (peptic ulcer disease)   . Leukopenia   . Abdominal pain   . Short bowel syndrome   . Internal hemorrhoids without mention of complication   . Stricture and stenosis of esophagus   . Osteoarthrosis, unspecified whether generalized or localized, unspecified site   . Thyrotoxicosis without mention of goiter or other cause, without mention of thyrotoxic crisis or storm   . Other and unspecified hyperlipidemia   . Gout, unspecified   . Diverticulosis of colon (without mention of hemorrhage)   . C. difficile colitis   . VITAMIN B12 DEFICIENCY 08/30/2009  . GOITER, MULTINODULAR 04/02/2009  . HYPOTHYROIDISM, POST-RADIATION 08/13/2009  . ASYMPTOMATIC POSTMENOPAUSAL STATUS 10/11/2008  . Esophageal reflux 06/12/2008  . PONV (postoperative nausea and vomiting)   . Varicose veins   . Type II or unspecified type diabetes mellitus without mention of complication, not stated as uncontrolled     no med in years diet controled  . Blood transfusion   . UTI (urinary tract infection)   . Kidney stones     "several"  . Unspecified essential hypertension   . Angina   . Shortness of breath on exertion     "sometimes"  . Anemia   . ANEMIA, IRON DEFICIENCY 05/08/2009  . History of lower GI bleeding   . H/O hiatal hernia   . Migraines   . Breast cancer 09/29/11    invasive grade III ductal ca,assoc high grade dcis,ER/PR=neg    Past Surgical History  Procedure Date  . Vein ligation and stripping 1980's    Right leg  . Lithotripsy     "4 or 5 times"  . Bunionectomy 1970's    bilateral  . Colonoscopy   . Esophagogastroduodenoscopy 07/15/2005  . Thyroid ultrasound 12/1994 and 12/1995  . Mastectomy w/ nodes partial 09/29/11    left  . Port a cath  placement 09/29/11    right chest  . Breast surgery   . Cholecystectomy 1990's  . Abdominal hysterectomy 1970's    with BSO  . Dilation and curettage of uterus   . Colon surgery     "several surgeries for short bowel syndrome"  . Abdominal adhesion surgery 1980's thru 1990's    "several"  . Kidney stone surgery 1990's    "tried to go up & get it but pushed it further up"  . Portacath placement 09/29/2011    Procedure: INSERTION PORT-A-CATH;  Surgeon: Ernestene Mention, MD;  Location: [facility] OR;  Service: General;  Laterality: N/A;  . Breast biopsy 08/13/11    left breast lower inner quadrant  . Breast lumpectomy w/ needle localization 09/29/11    left  breast=lymph node,excision benign/ ER/PR=neg, her 2 Positive    History   Social History  . Marital Status: Married    Spouse Name: N/A    Number of Children: N/A  . Years of Education: N/A   Occupational History  . Not on file.   Social History Main Topics  . Smoking status: Former Smoker -- 1.0 packs/day for 10 years    Types: Cigarettes    Quit date: 09/22/1985  . Smokeless tobacco: Never Used  . Alcohol Use: Yes     09/29/11 "  used to drink socially years ago"  . Drug Use: No  . Sexually Active: No     HRT x many yrs   Other Topics Concern  . Not on file   Social History Narrative   Pt gets regular exercise    Current Outpatient Prescriptions on File Prior to Visit  Medication Sig Dispense Refill  . Calcium Carbonate-Vitamin D (CALCIUM-VITAMIN D) 600-200 MG-UNIT CAPS Take 1 capsule by mouth daily.        . calcium gluconate 650 MG tablet Take 1 tablet (650 mg total) by mouth daily.  30 tablet  2  . cyanocobalamin (,VITAMIN B-12,) 1000 MCG/ML injection Inject 1,000 mcg into the muscle every 30 (thirty) days. Next one due the 26th of this month      . cyclobenzaprine (FLEXERIL) 5 MG tablet Take 5 mg by mouth 3 (three) times daily as needed.      Marland Kitchen dexamethasone (DECADRON) 4 MG tablet TAKE 2 TABLETS TWICE DAILY DAY PRIOR  TO TREATMENT, DAY OF AND DAY AFTER  30 tablet  2  . gabapentin (NEURONTIN) 100 MG capsule Take 1 capsule (100 mg total) by mouth 2 times daily at 12 noon and 4 pm.  60 capsule  3  . levothyroxine (SYNTHROID, LEVOTHROID) 125 MCG tablet 1 TAB ONCE DAILY  30 tablet  5  . lidocaine-prilocaine (EMLA) cream Apply topically as needed.  30 g  0  . LORazepam (ATIVAN) 0.5 MG tablet Take 1 tablet (0.5 mg total) by mouth every 6 (six) hours as needed (Nausea or vomiting).  30 tablet  0  . lovastatin (MEVACOR) 20 MG tablet Take 20 mg by mouth at bedtime.      . magnesium chloride (SLOW-MAG) 64 MG TBEC Take 1 tablet by mouth daily.       Marland Kitchen omeprazole (PRILOSEC) 40 MG capsule Take 1 capsule (40 mg total) by mouth daily.  30 capsule  6  . ondansetron (ZOFRAN) 8 MG tablet       . oxyCODONE-acetaminophen (PERCOCET/ROXICET) 5-325 MG per tablet Take 1-2 tablets by mouth every 6 (six) hours as needed for pain.  60 tablet  0  . potassium chloride SA (K-DUR,KLOR-CON) 20 MEQ tablet Take 1 tablet (20 mEq total) by mouth daily.  14 tablet  3  . prochlorperazine (COMPAZINE) 10 MG tablet Take 10 mg by mouth every 6 (six) hours as needed.      . prochlorperazine (COMPAZINE) 25 MG suppository Place 25 mg rectally every 12 (twelve) hours as needed.      . promethazine (PHENERGAN) 25 MG tablet TAKE 1 TABLET BY MOUTH EVERY 8 HOURS AS NEEDED FOR NAUSEA  30 tablet  2  . spironolactone (ALDACTONE) 25 MG tablet Take 1 tablet (25 mg total) by mouth daily.  30 tablet  3  . thiamine 100 MG tablet Take 100 mg by mouth daily.        . trazodone (DESYREL) 300 MG tablet TAKE 1 TABLET (300 MG TOTAL) BY MOUTH AT BEDTIME.  30 tablet  5   No current facility-administered medications on file prior to visit.    Allergies  Allergen Reactions  . Aspirin Other (See Comments)    REACTION: Gi Intolerance/ Burning in stomach  . Iodine Itching    Allergic to IVP dye  . Morphine And Related Itching    Family History  Problem Relation Age of  Onset  . Esophageal cancer Son     deceased  . Cancer Son     Esophageal  Cancer  . Diabetes Mother   . Heart disease Mother   . Colon cancer Paternal Uncle   . Cancer Paternal Uncle     Colon Cancer  . Kidney disease Sister   . Diabetes Father   . Hypertension Father   . Kidney disease Brother   . Kidney disease Brother   . Kidney disease Brother     BP 118/78  Pulse 86  Temp 98.3 F (36.8 C) (Oral)  Resp 12  Wt 209 lb 12.8 oz (95.165 kg)  SpO2 98%  Review of Systems Denies fever     Objective:   Physical Exam VITAL SIGNS:  See vs page GENERAL: no distress ABDOMEN:  abdomen is soft, nontender.  no hepatosplenomegaly. not distended.  no hernia   Lab Results  Component Value Date   HGBA1C 5.6 06/24/2011      Assessment & Plan:  Diarrhea, new, uncertain etiology

## 2012-03-31 NOTE — Patient Instructions (Addendum)
I hope you feel better soon.  If you don't feel better by next week, please call back.  Please stop the magnesium pills until the diarrhea is gone.  Please come back for a regular physical appointment after 06/24/12.

## 2012-03-31 NOTE — Progress Notes (Signed)
  Radiation Oncology         [phone] ________________________________  Name: [name] MRN: [id number]  Date: 03/25/2012  DOB: [birth year]  End of Treatment Note  Diagnosis:   Invasive ductal carcinoma of the left breast     Indication for treatment:      Curative  Radiation treatment dates:   02/08/2012 through 03/25/2012   Site/dose:   The patient was initially treated with a course of whole breast radiotherapy using tangent fields to a dose of 45 gray. This was completed at 1.8 gray per fraction. The patient then received a boost treatment to the seroma cavity using a three-field photon boost technique. This delivered an additional 16 gray. The patient's total dose was 61 gray.  Narrative: The patient tolerated radiation treatment relatively well.   The patient did have some skin irritation during treatment including some desquamation. This was in the process of healing adequately at the end of treatment. No moist desquamation present.  Plan: The patient has completed radiation treatment. The patient will return to radiation oncology clinic for routine followup in one month. I advised the patient to call or return sooner if they have any questions or concerns related to their recovery or treatment. ________________________________  Radene Gunning, M.D., Ph.D.

## 2012-03-31 NOTE — Progress Notes (Signed)
Complex simulation note  Diagnosis: Left-sided Breast cancer  Narrative The patient has initially been planned to receive a course of whole breast radiation to a dose of 45 gray in 25 fractions at 1.8 gray per fraction. The patient will now receive an additional boost to the seroma cavity which has been contoured. This will correspond to a boost of 16 gray in 8 fractions at 2 gray per fraction. To accomplish this, an additional 3 customized blocks have been designed for this purpose. A complex isodose plan is requested to ensure that the target area is adequately covered with radiation dose and that the nearby normal structures such as the lung are adequately spared. The patient's final total dose will be 61 gray.   Dorothy Puffer, M.D., Ph.D.

## 2012-04-13 ENCOUNTER — Ambulatory Visit (HOSPITAL_BASED_OUTPATIENT_CLINIC_OR_DEPARTMENT_OTHER): Payer: Medicare Other

## 2012-04-13 ENCOUNTER — Encounter: Payer: Self-pay | Admitting: Adult Health

## 2012-04-13 ENCOUNTER — Telehealth: Payer: Self-pay | Admitting: Oncology

## 2012-04-13 ENCOUNTER — Other Ambulatory Visit (HOSPITAL_BASED_OUTPATIENT_CLINIC_OR_DEPARTMENT_OTHER): Payer: Medicare Other | Admitting: Lab

## 2012-04-13 ENCOUNTER — Ambulatory Visit (HOSPITAL_BASED_OUTPATIENT_CLINIC_OR_DEPARTMENT_OTHER): Payer: Medicare Other | Admitting: Adult Health

## 2012-04-13 ENCOUNTER — Encounter: Payer: Self-pay | Admitting: *Deleted

## 2012-04-13 VITALS — BP 126/86 | HR 82 | Temp 98.2°F | Resp 20 | Ht 65.0 in | Wt 208.7 lb

## 2012-04-13 DIAGNOSIS — C50919 Malignant neoplasm of unspecified site of unspecified female breast: Secondary | ICD-10-CM

## 2012-04-13 DIAGNOSIS — D6481 Anemia due to antineoplastic chemotherapy: Secondary | ICD-10-CM

## 2012-04-13 DIAGNOSIS — C50319 Malignant neoplasm of lower-inner quadrant of unspecified female breast: Secondary | ICD-10-CM

## 2012-04-13 DIAGNOSIS — Z5112 Encounter for antineoplastic immunotherapy: Secondary | ICD-10-CM

## 2012-04-13 DIAGNOSIS — R52 Pain, unspecified: Secondary | ICD-10-CM

## 2012-04-13 DIAGNOSIS — N644 Mastodynia: Secondary | ICD-10-CM

## 2012-04-13 DIAGNOSIS — Z171 Estrogen receptor negative status [ER-]: Secondary | ICD-10-CM

## 2012-04-13 LAB — COMPREHENSIVE METABOLIC PANEL (CC13)
ALT: 21 U/L (ref 0–55)
AST: 25 U/L (ref 5–34)
CO2: 24 mEq/L (ref 22–29)
Calcium: 8.4 mg/dL (ref 8.4–10.4)
Chloride: 110 mEq/L — ABNORMAL HIGH (ref 98–107)
Creatinine: 1.1 mg/dL (ref 0.6–1.1)
Sodium: 142 mEq/L (ref 136–145)
Total Protein: 6.7 g/dL (ref 6.4–8.3)

## 2012-04-13 LAB — CBC WITH DIFFERENTIAL/PLATELET
BASO%: 0.3 % (ref 0.0–2.0)
Eosinophils Absolute: 0 10*3/uL (ref 0.0–0.5)
MCHC: 34.1 g/dL (ref 31.5–36.0)
MONO#: 0.5 10*3/uL (ref 0.1–0.9)
NEUT#: 2.1 10*3/uL (ref 1.5–6.5)
RBC: 3.17 10*6/uL — ABNORMAL LOW (ref 3.70–5.45)
RDW: 12.6 % (ref 11.2–14.5)
WBC: 3.7 10*3/uL — ABNORMAL LOW (ref 3.9–10.3)
lymph#: 1 10*3/uL (ref 0.9–3.3)
nRBC: 0 % (ref 0–0)

## 2012-04-13 MED ORDER — SODIUM CHLORIDE 0.9 % IV SOLN
Freq: Once | INTRAVENOUS | Status: AC
  Administered 2012-04-13: 14:00:00 via INTRAVENOUS

## 2012-04-13 MED ORDER — TRASTUZUMAB CHEMO INJECTION 440 MG
6.0000 mg/kg | Freq: Once | INTRAVENOUS | Status: AC
  Administered 2012-04-13: 567 mg via INTRAVENOUS
  Filled 2012-04-13: qty 27

## 2012-04-13 MED ORDER — DIPHENHYDRAMINE HCL 25 MG PO CAPS
50.0000 mg | ORAL_CAPSULE | Freq: Once | ORAL | Status: AC
  Administered 2012-04-13: 50 mg via ORAL

## 2012-04-13 MED ORDER — ACETAMINOPHEN 325 MG PO TABS
650.0000 mg | ORAL_TABLET | Freq: Once | ORAL | Status: AC
  Administered 2012-04-13: 650 mg via ORAL

## 2012-04-13 MED ORDER — OXYCODONE-ACETAMINOPHEN 5-325 MG PO TABS: 1.0000 | ORAL_TABLET | Freq: Four times a day (QID) | ORAL | Status: DC | PRN

## 2012-04-13 NOTE — Patient Instructions (Addendum)
Doing well.  Proceed with Herceptin today.  We will see you back in 3 weeks.  Please call us if you have any questions or concerns.

## 2012-04-13 NOTE — Telephone Encounter (Signed)
gve the pt her dec,jan2014 appt calendar. Pt is aware that her chemo appts will be added. Sent michelle a staff message to add the chemo appts.

## 2012-04-13 NOTE — Progress Notes (Signed)
OFFICE PROGRESS NOTE  CC  [name], MD [address] [place] [address] Dr. Claud Kelp  DIAGNOSIS: 66 year old female with new diagnosis of stage I ER-/PR-/Her2Neu positive invasive ductal breast cancer  PRIOR THERAPY: 1. 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 ER negative PR negative Her2 Neu positive with Ki -67 53%,   2. S/P porta cath palcement for chemotherapy  #3 patient has begun her adjuvant chemotherapy consisting of Taxotere carboplatinum and Herceptin. Her treatment began in May 2013. A total of 4 cycles of Taxotere carboplatinum and Herceptin combination are planned. Once she completes this she will then proceed to radiation therapy with concomitant Herceptin to be given every 3 weeks to finish out a year of treatment.  CURRENT THERAPY: Here for Herceptin  INTERVAL HISTORY: [name] 66 y.o. female returns for follow up.  She is doing well.  She had her echo and appt with Dr. Gala Romney on 10/28.  Her EF was 55% and she was cleared to continue chemotherapy.  She is completing radiation therapy and does have some tenderness in her left breast with an occasional twinge of pain.  She is otherwise feeling well and w/o complaints.    MEDICAL HISTORY: Past Medical History  Diagnosis Date  . Obesity   . PUD (peptic ulcer disease)   . Leukopenia   . Abdominal pain   . Short bowel syndrome   . Internal hemorrhoids without mention of complication   . Stricture and stenosis of esophagus   . Osteoarthrosis, unspecified whether generalized or localized, unspecified site   . Thyrotoxicosis without mention of goiter or other cause, without mention of thyrotoxic crisis or storm   . Other and unspecified hyperlipidemia   . Gout, unspecified   . Diverticulosis of colon (without mention of hemorrhage)   . C. difficile colitis   . VITAMIN B12 DEFICIENCY 08/30/2009  .  GOITER, MULTINODULAR 04/02/2009  . HYPOTHYROIDISM, POST-RADIATION 08/13/2009  . ASYMPTOMATIC POSTMENOPAUSAL STATUS 10/11/2008  . Esophageal reflux 06/12/2008  . PONV (postoperative nausea and vomiting)   . Varicose veins   . Type II or unspecified type diabetes mellitus without mention of complication, not stated as uncontrolled     no med in years diet controled  . Blood transfusion   . UTI (urinary tract infection)   . Kidney stones     "several"  . Unspecified essential hypertension   . Angina   . Shortness of breath on exertion     "sometimes"  . Anemia   . ANEMIA, IRON DEFICIENCY 05/08/2009  . History of lower GI bleeding   . H/O hiatal hernia   . Migraines   . Breast cancer 09/29/11    invasive grade III ductal ca,assoc high grade dcis,ER/PR=neg    ALLERGIES:  is allergic to aspirin; iodine; and morphine and related.  MEDICATIONS:  Current Outpatient Prescriptions  Medication Sig Dispense Refill  . Calcium Carbonate-Vitamin D (CALCIUM-VITAMIN D) 600-200 MG-UNIT CAPS Take 1 capsule by mouth daily.        . calcium gluconate 650 MG tablet Take 1 tablet (650 mg total) by mouth daily.  30 tablet  2  . cyanocobalamin (,VITAMIN B-12,) 1000 MCG/ML injection Inject 1,000 mcg into the muscle every 30 (thirty) days. Next one due the 26th of this month      . cyclobenzaprine (FLEXERIL) 5 MG tablet Take 5 mg by mouth 3 (three)  times daily as needed.      Marland Kitchen dexamethasone (DECADRON) 4 MG tablet TAKE 2 TABLETS TWICE DAILY DAY PRIOR TO TREATMENT, DAY OF AND DAY AFTER  30 tablet  2  . gabapentin (NEURONTIN) 100 MG capsule Take 1 capsule (100 mg total) by mouth 2 times daily at 12 noon and 4 pm.  60 capsule  3  . levothyroxine (SYNTHROID, LEVOTHROID) 125 MCG tablet 1 TAB ONCE DAILY  30 tablet  5  . lidocaine-prilocaine (EMLA) cream Apply topically as needed.  30 g  0  . LORazepam (ATIVAN) 0.5 MG tablet Take 1 tablet (0.5 mg total) by mouth every 6 (six) hours as needed (Nausea or vomiting).  30  tablet  0  . lovastatin (MEVACOR) 20 MG tablet Take 20 mg by mouth at bedtime.      . magnesium chloride (SLOW-MAG) 64 MG TBEC Take 1 tablet by mouth daily.       Marland Kitchen omeprazole (PRILOSEC) 40 MG capsule Take 1 capsule (40 mg total) by mouth daily.  30 capsule  6  . ondansetron (ZOFRAN) 8 MG tablet       . oxyCODONE-acetaminophen (PERCOCET/ROXICET) 5-325 MG per tablet Take 1-2 tablets by mouth every 6 (six) hours as needed for pain.  60 tablet  0  . potassium chloride SA (K-DUR,KLOR-CON) 20 MEQ tablet Take 1 tablet (20 mEq total) by mouth daily.  14 tablet  3  . prochlorperazine (COMPAZINE) 10 MG tablet Take 10 mg by mouth every 6 (six) hours as needed.      . prochlorperazine (COMPAZINE) 25 MG suppository Place 25 mg rectally every 12 (twelve) hours as needed.      . promethazine (PHENERGAN) 25 MG tablet TAKE 1 TABLET BY MOUTH EVERY 8 HOURS AS NEEDED FOR NAUSEA  30 tablet  2  . spironolactone (ALDACTONE) 25 MG tablet Take 1 tablet (25 mg total) by mouth daily.  30 tablet  3  . thiamine 100 MG tablet Take 100 mg by mouth daily.        . trazodone (DESYREL) 300 MG tablet TAKE 1 TABLET (300 MG TOTAL) BY MOUTH AT BEDTIME.  30 tablet  5    SURGICAL HISTORY:  Past Surgical History  Procedure Date  . Vein ligation and stripping 1980's    Right leg  . Lithotripsy     "4 or 5 times"  . Bunionectomy 1970's    bilateral  . Colonoscopy   . Esophagogastroduodenoscopy 07/15/2005  . Thyroid ultrasound 12/1994 and 12/1995  . Mastectomy w/ nodes partial 09/29/11    left  . Port a cath placement 09/29/11    right chest  . Breast surgery   . Cholecystectomy 1990's  . Abdominal hysterectomy 1970's    with BSO  . Dilation and curettage of uterus   . Colon surgery     "several surgeries for short bowel syndrome"  . Abdominal adhesion surgery 1980's thru 1990's    "several"  . Kidney stone surgery 1990's    "tried to go up & get it but pushed it further up"  . Portacath placement 09/29/2011    Procedure:  INSERTION PORT-A-CATH;  Surgeon: Ernestene Mention, MD;  Location: [facility] OR;  Service: General;  Laterality: N/A;  . Breast biopsy 08/13/11    left breast lower inner quadrant  . Breast lumpectomy w/ needle localization 09/29/11    left  breast=lymph node,excision benign/ ER/PR=neg, her 2 Positive    REVIEW OF SYSTEMS:   General: fatigue (-), night sweats (-),  fever (-), pain (+) Lymph: palpable nodes (-) HEENT: vision changes (-), mucositis (-), gum bleeding (-), epistaxis (-) Cardiovascular: chest pain (-), palpitations (-) Pulmonary: shortness of breath (-), dyspnea on exertion (-), cough (-), hemoptysis (-) GI:  Early satiety (-), melena (-), dysphagia (-), nausea/vomiting (-), diarrhea (-) GU: dysuria (-), hematuria (-), incontinence (-) Musculoskeletal: joint swelling (-), joint pain (-), back pain (-) Neuro: weakness (-), numbness (-), headache (-), confusion (-) Skin: Rash (-), lesions (-), dryness (-) Psych: depression (-), suicidal/homicidal ideation (-), feeling of hopelessness (-)   PHYSICAL EXAMINATION:  BP 126/86  Pulse 82  Temp 98.2 F (36.8 C) (Oral)  Resp 20  Ht 5\' 5"  (1.651 m)  Wt 208 lb 11.2 oz (94.666 kg)  BMI 34.73 kg/m2 General: Patient is a well appearing female in no acute distress HEENT: PERRLA, sclerae anicteric no conjunctival pallor, MMM Neck: supple, no palpable adenopathy Lungs: clear to auscultation bilaterally, no wheezes, rhonchi, or rales Cardiovascular: regular rate rhythm, S1, S2, no murmurs, rubs or gallops Abdomen: Soft, non-tender, non-distended, normoactive bowel sounds, no HSM Extremities: warm and well perfused, no clubbing, cyanosis, or edema Skin: No rashes or lesions Neuro: Non-focal Bilateral Breast Exam: left breast healing incision scar, with some tenderness, and radiation skin changes, no evidence of infections.Right breast no masses or nipple discharge ECOG PERFORMANCE STATUS: 1 - Symptomatic but completely ambulatory  LABORATORY  DATA: Lab Results  Component Value Date   WBC 3.7* 04/13/2012   HGB 9.8* 04/13/2012   HCT 28.7* 04/13/2012   MCV 90.5 04/13/2012   PLT 148 04/13/2012      Chemistry      Component Value Date/Time   NA 142 03/23/2012 0929   NA 141 01/20/2012 1139   K 2.9* 03/23/2012 0929   K 3.0* 01/20/2012 1139   CL 106 03/23/2012 0929   CL 103 01/20/2012 1139   CO2 23 03/23/2012 0929   CO2 23 01/20/2012 1139   BUN 13.0 03/23/2012 0929   BUN 14 01/20/2012 1139   CREATININE 0.8 03/23/2012 0929   CREATININE 0.86 01/20/2012 1139   CREATININE 0.88 03/31/2011 1550      Component Value Date/Time   CALCIUM 7.9* 03/23/2012 0929   CALCIUM 6.7* 01/20/2012 1139   CALCIUM 9.5 05/15/2010 2153   ALKPHOS 65 03/23/2012 0929   ALKPHOS 105 01/20/2012 1139   AST 35* 03/23/2012 0929   AST 24 01/20/2012 1139   ALT 35 03/23/2012 0929   ALT 13 01/20/2012 1139   BILITOT 0.60 03/23/2012 0929   BILITOT 0.8 01/20/2012 1139     ADDITIONAL INFORMATION: 1. PROGNOSTIC INDICATORS - ACIS Results IMMUNOHISTOCHEMICAL AND MORPHOMETRIC ANALYSIS BY THE AUTOMATED CELLULAR IMAGING SYSTEM (ACIS) Estrogen Receptor (Negative, <1%): 0%, NEGATIVE Progesterone Receptor (Negative, <1%): 0%, NEGATIVE COMMENT: The negative hormone receptor study(ies) in this case have an internal positive control. All controls stained appropriately Abigail Miyamoto MD Pathologist, Electronic Signature ( Signed 10/07/2011) FINAL DIAGNOSIS 1 of 4 FINAL for Esquilin, Otha O ([id number]) Diagnosis 1. Breast, lumpectomy, Left - INVASIVE GRADE III, DUCTAL CARCINOMA, SPANNING 0.7 CM. - ASSOCIATED HIGH GRADED DUCTAL CARCINOMA IN SITU. - LYMPH/VASCULAR INVASION NOT IDENTIFIED. - MARGINS ARE NEGATIVE. - SEE ONCOLOGY TEMPLATE. 2. Lymph node, sentinel, biopsy, Left axillary#1 - ONE BENIGN LYMPH NODE WITH NO TUMOR (0/1). - BENIGN GLANDULAR EPITHELIAL INCLUSIONS PRESENT. - SEE COMMENT. 3. Breast, excision, Posterior margin - BENIGN BREAST  PARENCHYMA. - NO ATYPIA, HYPERPLASIA, OR MALIGNANCY IDENTIFIED. 4. Lymph node, sentinel, biopsy, Left axillary #2 - ONE BENIGN  LYMPH NODE WITH NO TUMOR SEEN (0/1). Microscopic Comment 1. BREAST, INVASIVE TUMOR, WITH LYMPH NODE SAMPLING Specimen, including laterality: Left breast with posterior margin and sentinel lymph nodes. Procedure: Left breast lumpectomy with posterior margin excision and sentinel lymph node biopsies. Grade: III. Tubule formation: 3. Nuclear pleomorphism: 3. Mitotic:3. Tumor size (gross measurement and glass slide measurement): 0.7 cm. Margins: Invasive, distance to closest margin: At least 0.8 cm. In-situ, distance to closest margin: At least 0.8 cm. Lymphovascular invasion: Not identified. Ductal carcinoma in situ: Yes. Grade: High grade. Extensive intraductal component: No. Lobular neoplasia: No. Tumor focality: Unifocal. Treatment effect: N/A. Extent of tumor: Confined to breast parenchyma. Lymph nodes: # examined: 2. Lymph nodes with metastasis: 0. Breast prognostic profile: Performed on previous case ([id number]) Estrogen receptor: 0%, negative. Progesterone receptor: 0%, negative. Her 2 neu: 3.09, amplified. Ki-67: 53%. Non-neoplastic breast: Fat necrosis present. TNM: pT1b, pN0, MX. Comments: An estrogen receptor and progesterone receptor will be repeated on the current tumor and reported in an addendum. (RAH:gt, 10/01/11) 2. The left sentinel axillary lymph node #1 shows benign glandular epithelial inclusions. Some of the inclusions are ciliated. The nuclei are bland appearing and are not malignant. Smooth muscle myosin, p63 and calponin immunohistochemical stains are performed which do not show a myoepithelial layer. 2 of 4 FINAL for Scheunemann, LEVI CRASS ([id number]) Microscopic Comment(continued) Although this is the case, the inclusions are benign and do not represent metastatic carcinoma. Both Dr. Frederica Kuster and Dr. Colonel Bald have seen the left  sentinel axillary lymph node in consultation with agreement that the inclusions are benign and do not represent metastatic carcinoma. Zandra Abts MD Pathologist, Electronic Signature (Case signed 10/02/2011) Specimen Gross and Clinical Information Specimen(s) Obtained: 1. Breast, lumpectomy, Left 2. Lymph node, sentinel, biopsy, Left axillary#1 3. Breast, excision, Posterior margin 4. Lymph node, sentinel, biopsy, Left axillary #2 Specimen Clinical  RADIOGRAPHIC STUDIES:  ASSESSMENT: 66 year old with   1. 0.7 cm high grade invasive ductal carcinoma that is ER-, PR- Her2Neu +, sentinel node negative (T1bN0) pathologic stage I.Because patient is HER-2 positive she is receiving adjuvant chemotherapy and Herceptin. She is going to be receiving 4 cycles of TCH combination and then Herceptin alone to complete out 1years worth of treatment.  2.Receiving radiation therapy  3. Left hip pain and left leg swelling patient at this time does not want to have her hip replacement surgery she wants to wait until she completes all of her Herceptin.  4. Anemia due to chemotherapy  5. Pain   PLAN:   #1 [name] will receive her next dose of Herceptin today. Her echo was normal.  We will see her back in 3 weeks for her next appt and treatment.    #2 She has a f/u appt next week with radiation oncology.    #3 she will return in 3 weeks' time for next dose of Herceptin. Her anticipated Herceptin completion is May 2014.  #4 Left breast pain.  I have refilled her Percocet.  Should she have any erythema, warmth, or increased swelling and pain she will give [place] a call.    All questions were answered. The patient knows to call the clinic with any problems, questions or concerns. We can certainly see the patient much sooner if necessary.  I spent 25 minutes counseling the patient face to face. The total time spent in the appointment was 30 minutes.  This case was reviewed with Dr. Welton Flakes.     Cherie Ouch Lyn Hollingshead, NP Medical Oncology [facility] Phone: [phone]  04/13/2012, 1:12 PM

## 2012-04-13 NOTE — Patient Instructions (Signed)
[facility] Discharge Instructions for Patients Receiving Chemotherapy  Today you received the following chemotherapy agents Herceptin To help prevent nausea and vomiting after your treatment, we encourage you to take your nausea medication as prescribed.  If you develop nausea and vomiting that is not controlled by your nausea medication, call the clinic. If it is after clinic hours your family physician or the after hours number for the clinic or go to the Emergency Department.   BELOW ARE SYMPTOMS THAT SHOULD BE REPORTED IMMEDIATELY:  *FEVER GREATER THAN 100.5 F  *CHILLS WITH OR WITHOUT FEVER  NAUSEA AND VOMITING THAT IS NOT CONTROLLED WITH YOUR NAUSEA MEDICATION  *UNUSUAL SHORTNESS OF BREATH  *UNUSUAL BRUISING OR BLEEDING  TENDERNESS IN MOUTH AND THROAT WITH OR WITHOUT PRESENCE OF ULCERS  *URINARY PROBLEMS  *BOWEL PROBLEMS  UNUSUAL RASH Items with * indicate a potential emergency and should be followed up as soon as possible.  One of the nurses will contact you 24 hours after your treatment. Please let the nurse know about any problems that you may have experienced. Feel free to call the clinic you have any questions or concerns. The clinic phone number is [phone].   I have been informed and understand all the instructions given to me. I know to contact the clinic, my physician, or go to the Emergency Department if any problems should occur. I do not have any questions at this time, but understand that I may call the clinic during office hours   should I have any questions or need assistance in obtaining follow up care.    __________________________________________  _____________  __________ Signature of Patient or Authorized Representative            Date                   Time    __________________________________________ Nurse's Signature    

## 2012-04-13 NOTE — Progress Notes (Signed)
Saw patient today in center.  She was accompanied by her husband.  Relates she feels much better. Has bouts of pain.  She is scheduled to see provider today, and will address concerns then.  Patient states she does not need support group services.  She has all the support she needs.  She continues to received herceptin based therapy.  She understands to call with questions. New appointment schedule will be given today.

## 2012-04-20 ENCOUNTER — Encounter: Payer: Self-pay | Admitting: Radiation Oncology

## 2012-04-21 ENCOUNTER — Encounter: Payer: Self-pay | Admitting: Radiation Oncology

## 2012-04-21 ENCOUNTER — Ambulatory Visit
Admission: RE | Admit: 2012-04-21 | Discharge: 2012-04-21 | Disposition: A | Discharge status: Home / Self Care | Payer: Medicare Other | Source: Ambulatory Visit | Attending: Radiation Oncology | Admitting: Radiation Oncology

## 2012-04-21 VITALS — BP 127/92 | HR 79 | Temp 97.5°F | Resp 20 | Wt 210.8 lb

## 2012-04-21 DIAGNOSIS — C50319 Malignant neoplasm of lower-inner quadrant of unspecified female breast: Secondary | ICD-10-CM

## 2012-04-21 HISTORY — DX: Personal history of irradiation: Z92.3

## 2012-04-21 NOTE — Progress Notes (Signed)
Radiation Oncology         [phone] ________________________________  Name: [name] MRN: [id number]  Date: 04/21/2012  DOB: [birth year]  Follow-Up Visit Note  CC: [name], MD  Victorino December, MD  Diagnosis:   Invasive ductal carcinoma of the left breast  Interval Since Last Radiation:  One month   Narrative:  The patient returns today for routine follow-up.  The patient completed her radiotherapy on 03/25/2012. She is done well since that time. Her energy level goes up and down. The patient complains of some shooting pain in the left breast area as well as some occasional aching pain.                              ALLERGIES:  is allergic to aspirin; iodine; and morphine and related.  Meds: Current Outpatient Prescriptions  Medication Sig Dispense Refill  . Calcium Carbonate-Vitamin D (CALCIUM-VITAMIN D) 600-200 MG-UNIT CAPS Take 1 capsule by mouth daily.        . calcium gluconate 650 MG tablet Take 1 tablet (650 mg total) by mouth daily.  30 tablet  2  . cyanocobalamin (,VITAMIN B-12,) 1000 MCG/ML injection Inject 1,000 mcg into the muscle every 30 (thirty) days. Next one due the 26th of this month      . cyclobenzaprine (FLEXERIL) 5 MG tablet Take 5 mg by mouth 3 (three) times daily as needed.      Marland Kitchen dexamethasone (DECADRON) 4 MG tablet TAKE 2 TABLETS TWICE DAILY DAY PRIOR TO TREATMENT, DAY OF AND DAY AFTER  30 tablet  2  . gabapentin (NEURONTIN) 100 MG capsule Take 1 capsule (100 mg total) by mouth 2 times daily at 12 noon and 4 pm.  60 capsule  3  . levothyroxine (SYNTHROID, LEVOTHROID) 125 MCG tablet 1 TAB ONCE DAILY  30 tablet  5  . lidocaine-prilocaine (EMLA) cream Apply topically as needed.  30 g  0  . LORazepam (ATIVAN) 0.5 MG tablet Take 1 tablet (0.5 mg total) by mouth every 6 (six) hours as needed (Nausea or vomiting).  30 tablet  0  . lovastatin (MEVACOR) 20 MG tablet Take 20 mg by mouth at bedtime.      . magnesium chloride (SLOW-MAG) 64 MG TBEC  Take 1 tablet by mouth daily.       Marland Kitchen omeprazole (PRILOSEC) 40 MG capsule Take 1 capsule (40 mg total) by mouth daily.  30 capsule  6  . ondansetron (ZOFRAN) 8 MG tablet       . oxyCODONE-acetaminophen (PERCOCET/ROXICET) 5-325 MG per tablet Take 1-2 tablets by mouth every 6 (six) hours as needed for pain.  60 tablet  0  . potassium chloride SA (K-DUR,KLOR-CON) 20 MEQ tablet Take 1 tablet (20 mEq total) by mouth daily.  14 tablet  3  . prochlorperazine (COMPAZINE) 10 MG tablet Take 10 mg by mouth every 6 (six) hours as needed.      . prochlorperazine (COMPAZINE) 25 MG suppository Place 25 mg rectally every 12 (twelve) hours as needed.      . promethazine (PHENERGAN) 25 MG tablet TAKE 1 TABLET BY MOUTH EVERY 8 HOURS AS NEEDED FOR NAUSEA  30 tablet  2  . spironolactone (ALDACTONE) 25 MG tablet Take 1 tablet (25 mg total) by mouth daily.  30 tablet  3  . thiamine 100 MG tablet Take 100 mg by mouth daily.        . trazodone (  DESYREL) 300 MG tablet TAKE 1 TABLET (300 MG TOTAL) BY MOUTH AT BEDTIME.  30 tablet  5    Physical Findings: The patient is in no acute distress. Patient is alert and oriented.  weight is 210 lb 12.8 oz (95.618 kg). Her oral temperature is 97.5 F (36.4 C). Her blood pressure is 127/92 and her pulse is 79. Her respiration is 20. .   Some moderate hyperpigmentation present in the treatment area. Some dryness remains especially in the nipple region. Otherwise her skin looks good.  Lab Findings: Lab Results  Component Value Date   WBC 3.7* 04/13/2012   HGB 9.8* 04/13/2012   HCT 28.7* 04/13/2012   MCV 90.5 04/13/2012   PLT 148 04/13/2012     Radiographic Findings: No results found.  Impression/ Plan:    The patient is doing well one month out from her treatment. I discussed with her using skin cream for the next several weeks and I believe that she will continue to heal. She remains on Herceptin and she will see medical oncology next month. She will return to clinic on a  when necessary basis.    Radene Gunning, M.D., Ph.D.

## 2012-04-21 NOTE — Progress Notes (Signed)
Patient here follow up rad txs left breast, : 02/08/12-03/25/12, total 61 gray Alert,oriented x3, left breast well healed, still has tanning, soreness stated under axilla and nipple area sharp pains at times, no med changes,  patient gets Herceptin IV every 3 weeks, next round due Dec.08/2011 11:50 AM

## 2012-05-04 ENCOUNTER — Ambulatory Visit (HOSPITAL_BASED_OUTPATIENT_CLINIC_OR_DEPARTMENT_OTHER): Payer: Medicare Other

## 2012-05-04 ENCOUNTER — Other Ambulatory Visit (HOSPITAL_BASED_OUTPATIENT_CLINIC_OR_DEPARTMENT_OTHER): Payer: Medicare Other | Admitting: Lab

## 2012-05-04 ENCOUNTER — Ambulatory Visit (HOSPITAL_BASED_OUTPATIENT_CLINIC_OR_DEPARTMENT_OTHER): Payer: Medicare Other | Admitting: Adult Health

## 2012-05-04 ENCOUNTER — Encounter: Payer: Self-pay | Admitting: Adult Health

## 2012-05-04 VITALS — BP 132/85 | HR 73 | Temp 98.1°F | Resp 20 | Ht 65.0 in | Wt 210.1 lb

## 2012-05-04 DIAGNOSIS — C50919 Malignant neoplasm of unspecified site of unspecified female breast: Secondary | ICD-10-CM

## 2012-05-04 DIAGNOSIS — Z5112 Encounter for antineoplastic immunotherapy: Secondary | ICD-10-CM

## 2012-05-04 DIAGNOSIS — C50319 Malignant neoplasm of lower-inner quadrant of unspecified female breast: Secondary | ICD-10-CM

## 2012-05-04 DIAGNOSIS — R52 Pain, unspecified: Secondary | ICD-10-CM

## 2012-05-04 DIAGNOSIS — T451X5A Adverse effect of antineoplastic and immunosuppressive drugs, initial encounter: Secondary | ICD-10-CM

## 2012-05-04 LAB — CBC WITH DIFFERENTIAL/PLATELET
Basophils Absolute: 0 10*3/uL (ref 0.0–0.1)
EOS%: 0.9 % (ref 0.0–7.0)
Eosinophils Absolute: 0 10*3/uL (ref 0.0–0.5)
HCT: 28.8 % — ABNORMAL LOW (ref 34.8–46.6)
HGB: 9.7 g/dL — ABNORMAL LOW (ref 11.6–15.9)
MCH: 30.3 pg (ref 25.1–34.0)
MCV: 90 fL (ref 79.5–101.0)
MONO%: 10.2 % (ref 0.0–14.0)
NEUT%: 55.8 % (ref 38.4–76.8)

## 2012-05-04 LAB — COMPREHENSIVE METABOLIC PANEL (CC13)
Albumin: 3.8 g/dL (ref 3.5–5.0)
Alkaline Phosphatase: 74 U/L (ref 40–150)
BUN: 16 mg/dL (ref 7.0–26.0)
CO2: 26 mEq/L (ref 22–29)
Glucose: 80 mg/dl (ref 70–99)
Potassium: 3.8 mEq/L (ref 3.5–5.1)

## 2012-05-04 MED ORDER — DIPHENHYDRAMINE HCL 25 MG PO CAPS
50.0000 mg | ORAL_CAPSULE | Freq: Once | ORAL | Status: AC
  Administered 2012-05-04: 50 mg via ORAL

## 2012-05-04 MED ORDER — ACETAMINOPHEN 325 MG PO TABS
650.0000 mg | ORAL_TABLET | Freq: Once | ORAL | Status: AC
  Administered 2012-05-04: 650 mg via ORAL

## 2012-05-04 MED ORDER — LORAZEPAM 0.5 MG PO TABS: 0.5000 mg | ORAL_TABLET | Freq: Four times a day (QID) | ORAL | Status: AC | PRN

## 2012-05-04 MED ORDER — TRASTUZUMAB CHEMO INJECTION 440 MG
6.0000 mg/kg | Freq: Once | INTRAVENOUS | Status: AC
  Administered 2012-05-04: 567 mg via INTRAVENOUS
  Filled 2012-05-04: qty 27

## 2012-05-04 MED ORDER — OXYCODONE-ACETAMINOPHEN 5-325 MG PO TABS: 1.0000 | ORAL_TABLET | Freq: Four times a day (QID) | ORAL | Status: DC | PRN

## 2012-05-04 MED ORDER — SODIUM CHLORIDE 0.9 % IV SOLN
Freq: Once | INTRAVENOUS | Status: AC
  Administered 2012-05-04: 14:00:00 via INTRAVENOUS

## 2012-05-04 MED ORDER — HEPARIN SOD (PORK) LOCK FLUSH 100 UNIT/ML IV SOLN
500.0000 [IU] | Freq: Once | INTRAVENOUS | Status: DC | PRN
  Filled 2012-05-04: qty 5

## 2012-05-04 MED ORDER — SODIUM CHLORIDE 0.9 % IJ SOLN
10.0000 mL | INTRAMUSCULAR | Status: DC | PRN
  Filled 2012-05-04: qty 10

## 2012-05-04 NOTE — Patient Instructions (Signed)
Patient aware of next appointment; discharged home with no complaints. 

## 2012-05-04 NOTE — Patient Instructions (Signed)
Doing well.  Proceed with Herceptin.  I will refill your meds today.  Take two tablets of ibuprofen in the morning and evening.

## 2012-05-04 NOTE — Progress Notes (Signed)
OFFICE PROGRESS NOTE  CC  [name], MD [address] [place] [address] Dr. Claud Kelp  DIAGNOSIS: 66 year old female with new diagnosis of stage I ER-/PR-/Her2Neu positive invasive ductal breast cancer  PRIOR THERAPY: 1. 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 ER negative PR negative Her2 Neu positive with Ki -67 53%,   2. S/P porta cath palcement for chemotherapy  #3 patient has begun her adjuvant chemotherapy consisting of Taxotere carboplatinum and Herceptin. Her treatment began in May 2013. A total of 4 cycles of Taxotere carboplatinum and Herceptin combination are planned. Once she completes this she will then proceed to radiation therapy with concomitant Herceptin to be given every 3 weeks to finish out a year of treatment.  CURRENT THERAPY: Here for Herceptin  INTERVAL HISTORY: [name] 66 y.o. female returns for follow up.  She is doing well.  She is in a lot of pain.  She has left knee swelling, and arthritis that is acting up.  She's been told she needs a knee replacement, but wants to wait until after her Herceptin treatments.  She is requesting refills on her ativan and Percocet.    MEDICAL HISTORY: Past Medical History  Diagnosis Date  . Obesity   . PUD (peptic ulcer disease)   . Leukopenia   . Abdominal pain   . Short bowel syndrome   . Internal hemorrhoids without mention of complication   . Stricture and stenosis of esophagus   . Osteoarthrosis, unspecified whether generalized or localized, unspecified site   . Thyrotoxicosis without mention of goiter or other cause, without mention of thyrotoxic crisis or storm   . Other and unspecified hyperlipidemia   . Gout, unspecified   . Diverticulosis of colon (without mention of hemorrhage)   . C. difficile colitis   . VITAMIN B12 DEFICIENCY 08/30/2009  . GOITER, MULTINODULAR 04/02/2009  .  HYPOTHYROIDISM, POST-RADIATION 08/13/2009  . ASYMPTOMATIC POSTMENOPAUSAL STATUS 10/11/2008  . Esophageal reflux 06/12/2008  . PONV (postoperative nausea and vomiting)   . Varicose veins   . Type II or unspecified type diabetes mellitus without mention of complication, not stated as uncontrolled     no med in years diet controled  . Blood transfusion   . UTI (urinary tract infection)   . Kidney stones     "several"  . Unspecified essential hypertension   . Angina   . Shortness of breath on exertion     "sometimes"  . Anemia   . ANEMIA, IRON DEFICIENCY 05/08/2009  . History of lower GI bleeding   . H/O hiatal hernia   . Migraines   . Breast cancer 09/29/11    invasive grade III ductal ca,assoc high grade dcis,ER/PR=neg  . History of radiation therapy 02/08/12-03/25/12    left breast,total 61gy    ALLERGIES:  is allergic to aspirin; iodine; and morphine and related.  MEDICATIONS:  Current Outpatient Prescriptions  Medication Sig Dispense Refill  . Calcium Carbonate-Vitamin D (CALCIUM-VITAMIN D) 600-200 MG-UNIT CAPS Take 1 capsule by mouth daily.        . calcium gluconate 650 MG tablet Take 1 tablet (650 mg total) by mouth daily.  30 tablet  2  . cyanocobalamin (,VITAMIN B-12,) 1000 MCG/ML injection Inject 1,000 mcg into the muscle every 30 (thirty) days. Next one due the 26th of this month      . gabapentin (NEURONTIN) 100 MG capsule  Take 1 capsule (100 mg total) by mouth 2 times daily at 12 noon and 4 pm.  60 capsule  3  . levothyroxine (SYNTHROID, LEVOTHROID) 125 MCG tablet 1 TAB ONCE DAILY  30 tablet  5  . lidocaine-prilocaine (EMLA) cream Apply topically as needed.  30 g  0  . lovastatin (MEVACOR) 20 MG tablet Take 20 mg by mouth at bedtime.      . magnesium chloride (SLOW-MAG) 64 MG TBEC Take 1 tablet by mouth daily.       Marland Kitchen omeprazole (PRILOSEC) 40 MG capsule Take 1 capsule (40 mg total) by mouth daily.  30 capsule  6  . oxyCODONE-acetaminophen (PERCOCET/ROXICET) 5-325 MG per  tablet Take 1-2 tablets by mouth every 6 (six) hours as needed for pain.  90 tablet  0  . promethazine (PHENERGAN) 25 MG tablet TAKE 1 TABLET BY MOUTH EVERY 8 HOURS AS NEEDED FOR NAUSEA  30 tablet  2  . spironolactone (ALDACTONE) 25 MG tablet Take 1 tablet (25 mg total) by mouth daily.  30 tablet  3  . thiamine 100 MG tablet Take 100 mg by mouth daily.        . trazodone (DESYREL) 300 MG tablet TAKE 1 TABLET (300 MG TOTAL) BY MOUTH AT BEDTIME.  30 tablet  5  . LORazepam (ATIVAN) 0.5 MG tablet Take 1 tablet (0.5 mg total) by mouth every 6 (six) hours as needed (Nausea or vomiting).  60 tablet  0   No current facility-administered medications for this visit.   Facility-Administered Medications Ordered in Other Visits  Medication Dose Route Frequency Provider Last Rate Last Dose  . [COMPLETED] 0.9 %  sodium chloride infusion   Intravenous Once Victorino December, MD 20 mL/hr at 05/04/12 1413    . [COMPLETED] acetaminophen (TYLENOL) tablet 650 mg  650 mg Oral Once Victorino December, MD   650 mg at 05/04/12 1413  . [COMPLETED] diphenhydrAMINE (BENADRYL) capsule 50 mg  50 mg Oral Once Victorino December, MD   50 mg at 05/04/12 1413  . heparin lock flush 100 unit/mL  500 Units Intracatheter Once PRN Victorino December, MD      . sodium chloride 0.9 % injection 10 mL  10 mL Intracatheter PRN Victorino December, MD      . trastuzumab (HERCEPTIN) 567 mg in sodium chloride 0.9 % 250 mL chemo infusion  6 mg/kg (Treatment Plan Actual) Intravenous Once Victorino December, MD        SURGICAL HISTORY:  Past Surgical History  Procedure Date  . Vein ligation and stripping 1980's    Right leg  . Lithotripsy     "4 or 5 times"  . Bunionectomy 1970's    bilateral  . Colonoscopy   . Esophagogastroduodenoscopy 07/15/2005  . Thyroid ultrasound 12/1994 and 12/1995  . Mastectomy w/ nodes partial 09/29/11    left  . Port a cath placement 09/29/11    right chest  . Breast surgery   . Cholecystectomy 1990's  . Abdominal hysterectomy  1970's    with BSO  . Dilation and curettage of uterus   . Colon surgery     "several surgeries for short bowel syndrome"  . Abdominal adhesion surgery 1980's thru 1990's    "several"  . Kidney stone surgery 1990's    "tried to go up & get it but pushed it further up"  . Portacath placement 09/29/2011    Procedure: INSERTION PORT-A-CATH;  Surgeon: Ernestene Mention, MD;  Location:  [facility] OR;  Service: General;  Laterality: N/A;  . Breast biopsy 08/13/11    left breast lower inner quadrant  . Breast lumpectomy w/ needle localization 09/29/11    left  breast=lymph node,excision benign/ ER/PR=neg, her 2 Positive    REVIEW OF SYSTEMS:   General: fatigue (-), night sweats (-), fever (-), pain (+) Lymph: palpable nodes (-) HEENT: vision changes (-), mucositis (-), gum bleeding (-), epistaxis (-) Cardiovascular: chest pain (-), palpitations (-) Pulmonary: shortness of breath (-), dyspnea on exertion (-), cough (-), hemoptysis (-) GI:  Early satiety (-), melena (-), dysphagia (-), nausea/vomiting (-), diarrhea (-) GU: dysuria (-), hematuria (-), incontinence (-) Musculoskeletal: joint swelling (-), joint pain (-), back pain (-) Neuro: weakness (-), numbness (-), headache (-), confusion (-) Skin: Rash (-), lesions (-), dryness (-) Psych: depression (-), suicidal/homicidal ideation (-), feeling of hopelessness (-)   PHYSICAL EXAMINATION:  BP 132/85  Pulse 73  Temp 98.1 F (36.7 C) (Oral)  Resp 20  Ht 5\' 5"  (1.651 m)  Wt 210 lb 1.6 oz (95.301 kg)  BMI 34.96 kg/m2 General: Patient is a well appearing female in no acute distress HEENT: PERRLA, sclerae anicteric no conjunctival pallor, MMM Neck: supple, no palpable adenopathy Lungs: clear to auscultation bilaterally, no wheezes, rhonchi, or rales Cardiovascular: regular rate rhythm, S1, S2, no murmurs, rubs or gallops Abdomen: Soft, non-tender, non-distended, normoactive bowel sounds, no HSM Extremities: warm and well perfused, no clubbing,  cyanosis, or edema Skin: No rashes or lesions Neuro: Non-focal Bilateral Breast Exam: left breast healing incision scar, with some tenderness, and radiation skin changes, no evidence of infections.Right breast no masses or nipple discharge ECOG PERFORMANCE STATUS: 1 - Symptomatic but completely ambulatory  LABORATORY DATA: Lab Results  Component Value Date   WBC 3.2* 05/04/2012   HGB 9.7* 05/04/2012   HCT 28.8* 05/04/2012   MCV 90.0 05/04/2012   PLT 164 05/04/2012      Chemistry      Component Value Date/Time   NA 142 04/13/2012 1228   NA 141 01/20/2012 1139   K 3.5 04/13/2012 1228   K 3.0* 01/20/2012 1139   CL 110* 04/13/2012 1228   CL 103 01/20/2012 1139   CO2 24 04/13/2012 1228   CO2 23 01/20/2012 1139   BUN 14.0 04/13/2012 1228   BUN 14 01/20/2012 1139   CREATININE 1.1 04/13/2012 1228   CREATININE 0.86 01/20/2012 1139   CREATININE 0.88 03/31/2011 1550      Component Value Date/Time   CALCIUM 8.4 04/13/2012 1228   CALCIUM 6.7* 01/20/2012 1139   CALCIUM 9.5 05/15/2010 2153   ALKPHOS 69 04/13/2012 1228   ALKPHOS 105 01/20/2012 1139   AST 25 04/13/2012 1228   AST 24 01/20/2012 1139   ALT 21 04/13/2012 1228   ALT 13 01/20/2012 1139   BILITOT 0.58 04/13/2012 1228   BILITOT 0.8 01/20/2012 1139     ADDITIONAL INFORMATION: 1. PROGNOSTIC INDICATORS - ACIS Results IMMUNOHISTOCHEMICAL AND MORPHOMETRIC ANALYSIS BY THE AUTOMATED CELLULAR IMAGING SYSTEM (ACIS) Estrogen Receptor (Negative, <1%): 0%, NEGATIVE Progesterone Receptor (Negative, <1%): 0%, NEGATIVE COMMENT: The negative hormone receptor study(ies) in this case have an internal positive control. All controls stained appropriately Abigail Miyamoto MD Pathologist, Electronic Signature ( Signed 10/07/2011) FINAL DIAGNOSIS 1 of 4 FINAL for Desire, Kashena O ([id number]) Diagnosis 1. Breast, lumpectomy, Left - INVASIVE GRADE III, DUCTAL CARCINOMA, SPANNING 0.7 CM. - ASSOCIATED HIGH GRADED DUCTAL CARCINOMA IN SITU. -  LYMPH/VASCULAR INVASION NOT IDENTIFIED. - MARGINS ARE NEGATIVE. -  SEE ONCOLOGY TEMPLATE. 2. Lymph node, sentinel, biopsy, Left axillary#1 - ONE BENIGN LYMPH NODE WITH NO TUMOR (0/1). - BENIGN GLANDULAR EPITHELIAL INCLUSIONS PRESENT. - SEE COMMENT. 3. Breast, excision, Posterior margin - BENIGN BREAST PARENCHYMA. - NO ATYPIA, HYPERPLASIA, OR MALIGNANCY IDENTIFIED. 4. Lymph node, sentinel, biopsy, Left axillary #2 - ONE BENIGN LYMPH NODE WITH NO TUMOR SEEN (0/1). Microscopic Comment 1. BREAST, INVASIVE TUMOR, WITH LYMPH NODE SAMPLING Specimen, including laterality: Left breast with posterior margin and sentinel lymph nodes. Procedure: Left breast lumpectomy with posterior margin excision and sentinel lymph node biopsies. Grade: III. Tubule formation: 3. Nuclear pleomorphism: 3. Mitotic:3. Tumor size (gross measurement and glass slide measurement): 0.7 cm. Margins: Invasive, distance to closest margin: At least 0.8 cm. In-situ, distance to closest margin: At least 0.8 cm. Lymphovascular invasion: Not identified. Ductal carcinoma in situ: Yes. Grade: High grade. Extensive intraductal component: No. Lobular neoplasia: No. Tumor focality: Unifocal. Treatment effect: N/A. Extent of tumor: Confined to breast parenchyma. Lymph nodes: # examined: 2. Lymph nodes with metastasis: 0. Breast prognostic profile: Performed on previous case ([id number]) Estrogen receptor: 0%, negative. Progesterone receptor: 0%, negative. Her 2 neu: 3.09, amplified. Ki-67: 53%. Non-neoplastic breast: Fat necrosis present. TNM: pT1b, pN0, MX. Comments: An estrogen receptor and progesterone receptor will be repeated on the current tumor and reported in an addendum. (RAH:gt, 10/01/11) 2. The left sentinel axillary lymph node #1 shows benign glandular epithelial inclusions. Some of the inclusions are ciliated. The nuclei are bland appearing and are not malignant. Smooth muscle myosin, p63 and calponin  immunohistochemical stains are performed which do not show a myoepithelial layer. 2 of 4 FINAL for Shaikh, ANISE HARBIN ([id number]) Microscopic Comment(continued) Although this is the case, the inclusions are benign and do not represent metastatic carcinoma. Both Dr. Frederica Kuster and Dr. Colonel Bald have seen the left sentinel axillary lymph node in consultation with agreement that the inclusions are benign and do not represent metastatic carcinoma. Zandra Abts MD Pathologist, Electronic Signature (Case signed 10/02/2011) Specimen Gross and Clinical Information Specimen(s) Obtained: 1. Breast, lumpectomy, Left 2. Lymph node, sentinel, biopsy, Left axillary#1 3. Breast, excision, Posterior margin 4. Lymph node, sentinel, biopsy, Left axillary #2 Specimen Clinical  RADIOGRAPHIC STUDIES:  ASSESSMENT: 66 year old with   1. 0.7 cm high grade invasive ductal carcinoma that is ER-, PR- Her2Neu +, sentinel node negative (T1bN0) pathologic stage I.Because patient is HER-2 positive she is receiving adjuvant chemotherapy and Herceptin. She is going to be receiving 4 cycles of TCH combination and then Herceptin alone to complete out 1years worth of treatment.  2.Receiving radiation therapy  3. Left knee pain and left leg swelling patient at this time does not want to have her knee replacement surgery she wants to wait until she completes all of her Herceptin.  4. Anemia due to chemotherapy  5. Pain   PLAN:   #1 Ms. Bastyr will receive her next dose of Herceptin today. We will see her back in 3 weeks for her next appt and treatment.    #2 I have refilled her Percocet and Lorazepam.  I recommended Ibuprofen for her knee pain.    #3 she will return in 3 weeks' time for next dose of Herceptin. Her anticipated Herceptin completion is May 2014.  All questions were answered. The patient knows to call the clinic with any problems, questions or concerns. We can certainly see the patient much sooner if  necessary.  I spent 25 minutes counseling the patient face to face. The total time spent  in the appointment was 30 minutes.  This case was reviewed with Dr. Welton Flakes.    Cherie Ouch Lyn Hollingshead, NP Medical Oncology [facility] Phone: [phone]  05/04/2012, 2:55 PM

## 2012-05-09 ENCOUNTER — Other Ambulatory Visit: Payer: Self-pay | Admitting: Endocrinology

## 2012-05-23 ENCOUNTER — Ambulatory Visit (HOSPITAL_BASED_OUTPATIENT_CLINIC_OR_DEPARTMENT_OTHER): Payer: Medicare Other | Admitting: Adult Health

## 2012-05-23 ENCOUNTER — Other Ambulatory Visit (HOSPITAL_BASED_OUTPATIENT_CLINIC_OR_DEPARTMENT_OTHER): Payer: Medicare Other

## 2012-05-23 ENCOUNTER — Ambulatory Visit (HOSPITAL_BASED_OUTPATIENT_CLINIC_OR_DEPARTMENT_OTHER): Payer: Medicare Other

## 2012-05-23 ENCOUNTER — Encounter: Payer: Self-pay | Admitting: Adult Health

## 2012-05-23 VITALS — BP 118/82 | HR 77 | Temp 98.0°F | Resp 20 | Ht 65.0 in | Wt 210.2 lb

## 2012-05-23 DIAGNOSIS — Z171 Estrogen receptor negative status [ER-]: Secondary | ICD-10-CM

## 2012-05-23 DIAGNOSIS — C50919 Malignant neoplasm of unspecified site of unspecified female breast: Secondary | ICD-10-CM

## 2012-05-23 DIAGNOSIS — Z5112 Encounter for antineoplastic immunotherapy: Secondary | ICD-10-CM

## 2012-05-23 DIAGNOSIS — D6481 Anemia due to antineoplastic chemotherapy: Secondary | ICD-10-CM

## 2012-05-23 DIAGNOSIS — N61 Mastitis without abscess: Secondary | ICD-10-CM

## 2012-05-23 DIAGNOSIS — C50319 Malignant neoplasm of lower-inner quadrant of unspecified female breast: Secondary | ICD-10-CM

## 2012-05-23 LAB — CBC WITH DIFFERENTIAL/PLATELET
Eosinophils Absolute: 0.1 10*3/uL (ref 0.0–0.5)
HCT: 30.9 % — ABNORMAL LOW (ref 34.8–46.6)
LYMPH%: 29.3 % (ref 14.0–49.7)
MCHC: 34 g/dL (ref 31.5–36.0)
MONO#: 0.4 10*3/uL (ref 0.1–0.9)
NEUT#: 2 10*3/uL (ref 1.5–6.5)
NEUT%: 58.1 % (ref 38.4–76.8)
Platelets: 177 10*3/uL (ref 145–400)
WBC: 3.5 10*3/uL — ABNORMAL LOW (ref 3.9–10.3)

## 2012-05-23 LAB — COMPREHENSIVE METABOLIC PANEL (CC13)
BUN: 18 mg/dL (ref 7.0–26.0)
CO2: 23 mEq/L (ref 22–29)
Calcium: 8.9 mg/dL (ref 8.4–10.4)
Chloride: 105 mEq/L (ref 98–107)
Creatinine: 1 mg/dL (ref 0.6–1.1)
Glucose: 85 mg/dl (ref 70–99)

## 2012-05-23 MED ORDER — TRASTUZUMAB CHEMO INJECTION 440 MG
6.0000 mg/kg | Freq: Once | INTRAVENOUS | Status: AC
  Administered 2012-05-23: 567 mg via INTRAVENOUS
  Filled 2012-05-23: qty 27

## 2012-05-23 MED ORDER — HEPARIN SOD (PORK) LOCK FLUSH 100 UNIT/ML IV SOLN
500.0000 [IU] | Freq: Once | INTRAVENOUS | Status: AC | PRN
  Administered 2012-05-23: 500 [IU]
  Filled 2012-05-23: qty 5

## 2012-05-23 MED ORDER — DIPHENHYDRAMINE HCL 25 MG PO CAPS
50.0000 mg | ORAL_CAPSULE | Freq: Once | ORAL | Status: AC
  Administered 2012-05-23: 50 mg via ORAL

## 2012-05-23 MED ORDER — SODIUM CHLORIDE 0.9 % IJ SOLN
10.0000 mL | INTRAMUSCULAR | Status: DC | PRN
  Administered 2012-05-23: 10 mL
  Filled 2012-05-23: qty 10

## 2012-05-23 MED ORDER — SODIUM CHLORIDE 0.9 % IV SOLN
Freq: Once | INTRAVENOUS | Status: AC
  Administered 2012-05-23: 11:00:00 via INTRAVENOUS

## 2012-05-23 MED ORDER — ACETAMINOPHEN 325 MG PO TABS
650.0000 mg | ORAL_TABLET | Freq: Once | ORAL | Status: AC
  Administered 2012-05-23: 650 mg via ORAL

## 2012-05-23 MED ORDER — CEPHALEXIN 500 MG PO CAPS: 500.0000 mg | ORAL_CAPSULE | Freq: Four times a day (QID) | ORAL | Status: DC

## 2012-05-23 NOTE — Patient Instructions (Addendum)
Doing well.  Proceed with Herceptin.  I have called you in Keflex for mastitis.  Please call us if you have any questions or concerns.

## 2012-05-23 NOTE — Patient Instructions (Signed)
[facility] Discharge Instructions for Patients Receiving Chemotherapy  Today you received the following chemotherapy agent Herceptin.  To help prevent nausea and vomiting after your treatment, we encourage you to take your nausea medication as often as prescribed for by Dr Khan.    If you develop nausea and vomiting that is not controlled by your nausea medication, call the clinic. If it is after clinic hours your family physician or the after hours number for the clinic or go to the Emergency Department.   BELOW ARE SYMPTOMS THAT SHOULD BE REPORTED IMMEDIATELY:  *FEVER GREATER THAN 100.5 F  *CHILLS WITH OR WITHOUT FEVER  NAUSEA AND VOMITING THAT IS NOT CONTROLLED WITH YOUR NAUSEA MEDICATION  *UNUSUAL SHORTNESS OF BREATH  *UNUSUAL BRUISING OR BLEEDING  TENDERNESS IN MOUTH AND THROAT WITH OR WITHOUT PRESENCE OF ULCERS  *URINARY PROBLEMS  *BOWEL PROBLEMS  UNUSUAL RASH Items with * indicate a potential emergency and should be followed up as soon as possible.  One of the nurses will contact you 24 hours after your treatment. Please let the nurse know about any problems that you may have experienced. Feel free to call the clinic you have any questions or concerns. The clinic phone number is [phone].   I have been informed and understand all the instructions given to me. I know to contact the clinic, my physician, or go to the Emergency Department if any problems should occur. I do not have any questions at this time, but understand that I may call the clinic during office hours   should I have any questions or need assistance in obtaining follow up care.    __________________________________________  _____________  __________ Signature of Patient or Authorized Representative            Date                   Time    __________________________________________ Nurse's Signature    

## 2012-05-23 NOTE — Progress Notes (Signed)
OFFICE PROGRESS NOTE  CC  [name], MD [address] [place] [address] Dr. Claud Kelp  DIAGNOSIS: 66 year old female with new diagnosis of stage I ER-/PR-/Her2Neu positive invasive ductal breast cancer  PRIOR THERAPY: 1. 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 ER negative PR negative Her2 Neu positive with Ki -67 53%,   2. S/P porta cath palcement for chemotherapy  #3 patient has begun her adjuvant chemotherapy consisting of Taxotere carboplatinum and Herceptin. Her treatment began in May 2013. A total of 4 cycles of Taxotere carboplatinum and Herceptin combination are planned. Once she completes this she will then proceed to radiation therapy with concomitant Herceptin to be given every 3 weeks to finish out a year of treatment.  CURRENT THERAPY: Here for Herceptin  INTERVAL HISTORY: [name] 66 y.[name]. female returns for her next dose of Herceptin.  She is c/[name] left breast swelling, redness and pain.  Otherwise she is feeling well and without questions or concerns.  She denies fevers, chills, headaches, or any other concerns.   MEDICAL HISTORY: Past Medical History  Diagnosis Date  . Obesity   . PUD (peptic ulcer disease)   . Leukopenia   . Abdominal pain   . Short bowel syndrome   . Internal hemorrhoids without mention of complication   . Stricture and stenosis of esophagus   . Osteoarthrosis, unspecified whether generalized or localized, unspecified site   . Thyrotoxicosis without mention of goiter or other cause, without mention of thyrotoxic crisis or storm   . Other and unspecified hyperlipidemia   . Gout, unspecified   . Diverticulosis of colon (without mention of hemorrhage)   . C. difficile colitis   . VITAMIN B12 DEFICIENCY 08/30/2009  . GOITER, MULTINODULAR 04/02/2009  . HYPOTHYROIDISM, POST-RADIATION 08/13/2009  . ASYMPTOMATIC POSTMENOPAUSAL STATUS  10/11/2008  . Esophageal reflux 06/12/2008  . PONV (postoperative nausea and vomiting)   . Varicose veins   . Type II or unspecified type diabetes mellitus without mention of complication, not stated as uncontrolled     no med in years diet controled  . Blood transfusion   . UTI (urinary tract infection)   . Kidney stones     "several"  . Unspecified essential hypertension   . Angina   . Shortness of breath on exertion     "sometimes"  . Anemia   . ANEMIA, IRON DEFICIENCY 05/08/2009  . History of lower GI bleeding   . H/[name] hiatal hernia   . Migraines   . Breast cancer 09/29/11    invasive grade III ductal ca,assoc high grade dcis,ER/PR=neg  . History of radiation therapy 02/08/12-03/25/12    left breast,total 59gy    ALLERGIES:  is allergic to aspirin; iodine; and morphine and related.  MEDICATIONS:  Current Outpatient Prescriptions  Medication Sig Dispense Refill  . Calcium Carbonate-Vitamin D (CALCIUM-VITAMIN D) 600-200 MG-UNIT CAPS Take 1 capsule by mouth daily.        . calcium gluconate 650 MG tablet Take 1 tablet (650 mg total) by mouth daily.  30 tablet  2  . cyanocobalamin (,VITAMIN B-12,) 1000 MCG/ML injection Inject 1,000 mcg into the muscle every 30 (thirty) days. Next one due the 26th of this month      . cyclobenzaprine (FLEXERIL) 5 MG tablet Take 5 mg by mouth Daily.      Marland Kitchen gabapentin (NEURONTIN) 100 MG capsule Take 1 capsule (  100 mg total) by mouth 2 times daily at 12 noon and 4 pm.  60 capsule  3  . levothyroxine (SYNTHROID, LEVOTHROID) 125 MCG tablet 1 TAB ONCE DAILY  30 tablet  5  . lidocaine-prilocaine (EMLA) cream Apply topically as needed.  30 g  0  . LORazepam (ATIVAN) 0.5 MG tablet Take 1 tablet (0.5 mg total) by mouth every 6 (six) hours as needed (Nausea or vomiting).  60 tablet  0  . lovastatin (MEVACOR) 20 MG tablet TAKE 1 TABLET EVERY DAY  30 tablet  2  . magnesium chloride (SLOW-MAG) 64 MG TBEC Take 1 tablet by mouth daily.       Marland Kitchen omeprazole (PRILOSEC)  40 MG capsule Take 1 capsule (40 mg total) by mouth daily.  30 capsule  6  . oxyCODONE-acetaminophen (PERCOCET/ROXICET) 5-325 MG per tablet Take 1-2 tablets by mouth every 6 (six) hours as needed for pain.  90 tablet  0  . promethazine (PHENERGAN) 25 MG tablet TAKE 1 TABLET BY MOUTH EVERY 8 HOURS AS NEEDED FOR NAUSEA  30 tablet  2  . spironolactone (ALDACTONE) 25 MG tablet Take 1 tablet (25 mg total) by mouth daily.  30 tablet  3  . thiamine 100 MG tablet Take 100 mg by mouth daily.        . trazodone (DESYREL) 300 MG tablet TAKE 1 TABLET (300 MG TOTAL) BY MOUTH AT BEDTIME.  30 tablet  5  . cephALEXin (KEFLEX) 500 MG capsule Take 1 capsule (500 mg total) by mouth 4 (four) times daily.  56 capsule  0  . fluorometholone (FML) 0.1 % ophthalmic suspension Apply 1 drop to eye Twice daily.       No current facility-administered medications for this visit.   Facility-Administered Medications Ordered in Other Visits  Medication Dose Route Frequency Provider Last Rate Last Dose  . sodium chloride 0.9 % injection 10 mL  10 mL Intracatheter PRN Victorino December, MD   10 mL at 05/23/12 1142    SURGICAL HISTORY:  Past Surgical History  Procedure Date  . Vein ligation and stripping 1980's    Right leg  . Lithotripsy     "4 or 5 times"  . Bunionectomy 1970's    bilateral  . Colonoscopy   . Esophagogastroduodenoscopy 07/15/2005  . Thyroid ultrasound 12/1994 and 12/1995  . Mastectomy w/ nodes partial 09/29/11    left  . Port a cath placement 09/29/11    right chest  . Breast surgery   . Cholecystectomy 1990's  . Abdominal hysterectomy 1970's    with BSO  . Dilation and curettage of uterus   . Colon surgery     "several surgeries for short bowel syndrome"  . Abdominal adhesion surgery 1980's thru 1990's    "several"  . Kidney stone surgery 1990's    "tried to go up & get it but pushed it further up"  . Portacath placement 09/29/2011    Procedure: INSERTION PORT-A-CATH;  Surgeon: Ernestene Mention,  MD;  Location: [facility] OR;  Service: General;  Laterality: N/A;  . Breast biopsy 08/13/11    left breast lower inner quadrant  . Breast lumpectomy w/ needle localization 09/29/11    left  breast=lymph node,excision benign/ ER/PR=neg, her 2 Positive    REVIEW OF SYSTEMS:   General: fatigue (-), night sweats (-), fever (-), pain (+) Lymph: palpable nodes (-) HEENT: vision changes (-), mucositis (-), gum bleeding (-), epistaxis (-) Cardiovascular: chest pain (-), palpitations (-) Pulmonary: shortness  of breath (-), dyspnea on exertion (-), cough (-), hemoptysis (-) GI:  Early satiety (-), melena (-), dysphagia (-), nausea/vomiting (-), diarrhea (-) GU: dysuria (-), hematuria (-), incontinence (-) Musculoskeletal: joint swelling (-), joint pain (-), back pain (-) Neuro: weakness (-), numbness (-), headache (-), confusion (-) Skin: Rash (-), lesions (-), dryness (-) Psych: depression (-), suicidal/homicidal ideation (-), feeling of hopelessness (-)   PHYSICAL EXAMINATION:  BP 118/82  Pulse 77  Temp 98 F (36.7 C) (Oral)  Resp 20  Ht 5\' 5"  (1.651 m)  Wt 210 lb 3.2 oz (95.346 kg)  BMI 34.98 kg/m2 General: Patient is a well appearing female in no acute distress HEENT: PERRLA, sclerae anicteric no conjunctival pallor, MMM Neck: supple, no palpable adenopathy Lungs: clear to auscultation bilaterally, no wheezes, rhonchi, or rales Cardiovascular: regular rate rhythm, S1, S2, no murmurs, rubs or gallops Abdomen: Soft, non-tender, non-distended, normoactive bowel sounds, no HSM Extremities: warm and well perfused, no clubbing, cyanosis, or edema Skin: No rashes or lesions Neuro: Non-focal Bilateral Breast Exam: left breast healing incision scar, with some tenderness, and radiation skin changes, left breast swollen and erythematous .Right breast no masses or nipple discharge ECOG PERFORMANCE STATUS: 1 - Symptomatic but completely ambulatory  LABORATORY DATA: Lab Results  Component Value Date    WBC 3.5* 05/23/2012   HGB 10.5* 05/23/2012   HCT 30.9* 05/23/2012   MCV 89.3 05/23/2012   PLT 177 05/23/2012      Chemistry      Component Value Date/Time   NA 139 05/23/2012 0927   NA 141 01/20/2012 1139   K 3.7 05/23/2012 0927   K 3.0* 01/20/2012 1139   CL 105 05/23/2012 0927   CL 103 01/20/2012 1139   CO2 23 05/23/2012 0927   CO2 23 01/20/2012 1139   BUN 18.0 05/23/2012 0927   BUN 14 01/20/2012 1139   CREATININE 1.0 05/23/2012 0927   CREATININE 0.86 01/20/2012 1139   CREATININE 0.88 03/31/2011 1550      Component Value Date/Time   CALCIUM 8.9 05/23/2012 0927   CALCIUM 6.7* 01/20/2012 1139   CALCIUM 9.5 05/15/2010 2153   ALKPHOS 71 05/23/2012 0927   ALKPHOS 105 01/20/2012 1139   AST 23 05/23/2012 0927   AST 24 01/20/2012 1139   ALT 19 05/23/2012 0927   ALT 13 01/20/2012 1139   BILITOT 0.66 05/23/2012 0927   BILITOT 0.8 01/20/2012 1139     ADDITIONAL INFORMATION: 1. PROGNOSTIC INDICATORS - ACIS Results IMMUNOHISTOCHEMICAL AND MORPHOMETRIC ANALYSIS BY THE AUTOMATED CELLULAR IMAGING SYSTEM (ACIS) Estrogen Receptor (Negative, <1%): 0%, NEGATIVE Progesterone Receptor (Negative, <1%): 0%, NEGATIVE COMMENT: The negative hormone receptor study(ies) in this case have an internal positive control. All controls stained appropriately Rachel Miyamoto MD Pathologist, Electronic Signature ( Signed 10/07/2011) FINAL DIAGNOSIS 1 of 4 FINAL for Neu, [name] ([id number]) Diagnosis 1. Breast, lumpectomy, Left - INVASIVE GRADE III, DUCTAL CARCINOMA, SPANNING 0.7 CM. - ASSOCIATED HIGH GRADED DUCTAL CARCINOMA IN SITU. - LYMPH/VASCULAR INVASION NOT IDENTIFIED. - MARGINS ARE NEGATIVE. - SEE ONCOLOGY TEMPLATE. 2. Lymph node, sentinel, biopsy, Left axillary#1 - ONE BENIGN LYMPH NODE WITH NO TUMOR (0/1). - BENIGN GLANDULAR EPITHELIAL INCLUSIONS PRESENT. - SEE COMMENT. 3. Breast, excision, Posterior margin - BENIGN BREAST PARENCHYMA. - NO ATYPIA, HYPERPLASIA, OR MALIGNANCY  IDENTIFIED. 4. Lymph node, sentinel, biopsy, Left axillary #2 - ONE BENIGN LYMPH NODE WITH NO TUMOR SEEN (0/1). Microscopic Comment 1. BREAST, INVASIVE TUMOR, WITH LYMPH NODE SAMPLING Specimen, including laterality: Left breast with posterior margin  and sentinel lymph nodes. Procedure: Left breast lumpectomy with posterior margin excision and sentinel lymph node biopsies. Grade: III. Tubule formation: 3. Nuclear pleomorphism: 3. Mitotic:3. Tumor size (gross measurement and glass slide measurement): 0.7 cm. Margins: Invasive, distance to closest margin: At least 0.8 cm. In-situ, distance to closest margin: At least 0.8 cm. Lymphovascular invasion: Not identified. Ductal carcinoma in situ: Yes. Grade: High grade. Extensive intraductal component: No. Lobular neoplasia: No. Tumor focality: Unifocal. Treatment effect: N/A. Extent of tumor: Confined to breast parenchyma. Lymph nodes: # examined: 2. Lymph nodes with metastasis: 0. Breast prognostic profile: Performed on previous case ([id number]) Estrogen receptor: 0%, negative. Progesterone receptor: 0%, negative. Her 2 neu: 3.09, amplified. Ki-67: 53%. Non-neoplastic breast: Fat necrosis present. TNM: pT1b, pN0, MX. Comments: An estrogen receptor and progesterone receptor will be repeated on the current tumor and reported in an addendum. ([name], [name]) 2. The left sentinel axillary lymph node #1 shows benign glandular epithelial inclusions. Some of the inclusions are ciliated. The nuclei are bland appearing and are not malignant. Smooth muscle myosin, p63 and calponin immunohistochemical stains are performed which do not show a myoepithelial layer. 2 of 4 FINAL for [name], [name] ([id number]) Microscopic Comment(continued) Although this is the case, the inclusions are benign and do not represent metastatic carcinoma. Both Dr. Frederica Kuster and Dr. Colonel Bald have seen the left sentinel axillary lymph node in consultation with  agreement that the inclusions are benign and do not represent metastatic carcinoma. Rachel Abts MD Pathologist, Electronic Signature (Case signed 10/02/2011) Specimen Gross and Clinical Information Specimen(s) Obtained: 1. Breast, lumpectomy, Left 2. Lymph node, sentinel, biopsy, Left axillary#1 3. Breast, excision, Posterior margin 4. Lymph node, sentinel, biopsy, Left axillary #2 Specimen Clinical  RADIOGRAPHIC STUDIES:  ASSESSMENT: 66 year old with   1. 0.7 cm high grade invasive ductal carcinoma that is ER-, PR- Her2Neu +, sentinel node negative (T1bN0) pathologic stage I.Because patient is HER-2 positive she is receiving adjuvant chemotherapy and Herceptin. She is going to be receiving 4 cycles of TCH combination and then Herceptin alone to complete out 1years worth of treatment.  2.Receiving radiation therapy  3. Left knee pain and left leg swelling patient at this time does not want to have her knee replacement surgery she wants to wait until she completes all of her Herceptin.  4. Anemia due to chemotherapy  5. Pain  6. Mastitis   PLAN:   #1 [name] will receive her next dose of Herceptin today. We will see her back in 3 weeks for her next appt and treatment.    #2 I have called in Keflex for [name] to take for mastitis.  She knows to call if any swelling, erythema or pain worsens.    #3 she will return in 3 weeks' time for next dose of Herceptin. Her anticipated Herceptin completion is May 2014.  All questions were answered. The patient knows to call the clinic with any problems, questions or concerns. We can certainly see the patient much sooner if necessary.  I spent 25 minutes counseling the patient face to face. The total time spent in the appointment was 30 minutes.  This case was reviewed with Dr. Welton Flakes.    Cherie Ouch Lyn Hollingshead, NP Medical On[facility] Phone: [phone]  05/23/2012, 1:25 PM

## 2012-06-07 ENCOUNTER — Other Ambulatory Visit: Payer: Self-pay | Admitting: Emergency Medicine

## 2012-06-07 DIAGNOSIS — R52 Pain, unspecified: Secondary | ICD-10-CM

## 2012-06-07 MED ORDER — OXYCODONE-ACETAMINOPHEN 5-325 MG PO TABS: 1.0000 | ORAL_TABLET | Freq: Four times a day (QID) | ORAL | Status: DC | PRN

## 2012-06-07 NOTE — Telephone Encounter (Signed)
Pt called, request Oxycodone 5mg /325mg  refill. Reviewed with NP. VO " ok to trefill Oxycodone 5mg -325mg  Q#90 refill-0. Pt notified rx ready for pick up.

## 2012-06-15 ENCOUNTER — Telehealth: Payer: Self-pay | Admitting: *Deleted

## 2012-06-15 ENCOUNTER — Ambulatory Visit (HOSPITAL_BASED_OUTPATIENT_CLINIC_OR_DEPARTMENT_OTHER): Payer: Medicare Other

## 2012-06-15 ENCOUNTER — Ambulatory Visit (HOSPITAL_BASED_OUTPATIENT_CLINIC_OR_DEPARTMENT_OTHER): Payer: Medicare Other | Admitting: Adult Health

## 2012-06-15 ENCOUNTER — Other Ambulatory Visit (HOSPITAL_BASED_OUTPATIENT_CLINIC_OR_DEPARTMENT_OTHER): Payer: Medicare Other | Admitting: Lab

## 2012-06-15 ENCOUNTER — Encounter: Payer: Self-pay | Admitting: Adult Health

## 2012-06-15 VITALS — BP 127/84 | HR 84 | Temp 97.5°F | Resp 20 | Ht 65.0 in | Wt 210.6 lb

## 2012-06-15 DIAGNOSIS — C50319 Malignant neoplasm of lower-inner quadrant of unspecified female breast: Secondary | ICD-10-CM

## 2012-06-15 DIAGNOSIS — D6481 Anemia due to antineoplastic chemotherapy: Secondary | ICD-10-CM

## 2012-06-15 DIAGNOSIS — R52 Pain, unspecified: Secondary | ICD-10-CM

## 2012-06-15 DIAGNOSIS — Z853 Personal history of malignant neoplasm of breast: Secondary | ICD-10-CM

## 2012-06-15 DIAGNOSIS — Z5112 Encounter for antineoplastic immunotherapy: Secondary | ICD-10-CM

## 2012-06-15 DIAGNOSIS — Z79899 Other long term (current) drug therapy: Secondary | ICD-10-CM

## 2012-06-15 DIAGNOSIS — N63 Unspecified lump in unspecified breast: Secondary | ICD-10-CM

## 2012-06-15 DIAGNOSIS — C50919 Malignant neoplasm of unspecified site of unspecified female breast: Secondary | ICD-10-CM

## 2012-06-15 LAB — CBC WITH DIFFERENTIAL/PLATELET
BASO%: 0.3 % (ref 0.0–2.0)
Basophils Absolute: 0 10*3/uL (ref 0.0–0.1)
Eosinophils Absolute: 0 10*3/uL (ref 0.0–0.5)
HCT: 30.5 % — ABNORMAL LOW (ref 34.8–46.6)
HGB: 10.1 g/dL — ABNORMAL LOW (ref 11.6–15.9)
LYMPH%: 29.9 % (ref 14.0–49.7)
MCHC: 33.1 g/dL (ref 31.5–36.0)
MONO#: 0.5 10*3/uL (ref 0.1–0.9)
NEUT%: 56.4 % (ref 38.4–76.8)
Platelets: 185 10*3/uL (ref 145–400)
WBC: 3.7 10*3/uL — ABNORMAL LOW (ref 3.9–10.3)

## 2012-06-15 LAB — COMPREHENSIVE METABOLIC PANEL (CC13)
BUN: 15 mg/dL (ref 7.0–26.0)
CO2: 25 mEq/L (ref 22–29)
Calcium: 9.4 mg/dL (ref 8.4–10.4)
Chloride: 103 mEq/L (ref 98–107)
Creatinine: 1 mg/dL (ref 0.6–1.1)
Glucose: 84 mg/dl (ref 70–99)

## 2012-06-15 MED ORDER — DIPHENHYDRAMINE HCL 25 MG PO CAPS
50.0000 mg | ORAL_CAPSULE | Freq: Once | ORAL | Status: AC
  Administered 2012-06-15: 50 mg via ORAL

## 2012-06-15 MED ORDER — ACETAMINOPHEN 325 MG PO TABS
650.0000 mg | ORAL_TABLET | Freq: Once | ORAL | Status: AC
  Administered 2012-06-15: 650 mg via ORAL

## 2012-06-15 MED ORDER — HEPARIN SOD (PORK) LOCK FLUSH 100 UNIT/ML IV SOLN
500.0000 [IU] | Freq: Once | INTRAVENOUS | Status: AC | PRN
  Administered 2012-06-15: 500 [IU]
  Filled 2012-06-15: qty 5

## 2012-06-15 MED ORDER — SODIUM CHLORIDE 0.9 % IV SOLN
Freq: Once | INTRAVENOUS | Status: AC
  Administered 2012-06-15: 10:00:00 via INTRAVENOUS

## 2012-06-15 MED ORDER — SODIUM CHLORIDE 0.9 % IJ SOLN
10.0000 mL | INTRAMUSCULAR | Status: DC | PRN
  Administered 2012-06-15: 10 mL
  Filled 2012-06-15: qty 10

## 2012-06-15 MED ORDER — TRASTUZUMAB CHEMO INJECTION 440 MG
6.0000 mg/kg | Freq: Once | INTRAVENOUS | Status: AC
  Administered 2012-06-15: 567 mg via INTRAVENOUS
  Filled 2012-06-15: qty 27

## 2012-06-15 NOTE — Progress Notes (Signed)
OFFICE PROGRESS NOTE  CC  [name], MD [address] [place] [address] Dr. Claud Kelp  DIAGNOSIS: 67 year old female with new diagnosis of stage I ER-/PR-/Her2Neu positive invasive ductal breast cancer  PRIOR THERAPY: 1. 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 ER negative PR negative Her2 Neu positive with Ki -67 53%,   2. S/P porta cath palcement for chemotherapy  #3 patient has begun her adjuvant chemotherapy consisting of Taxotere carboplatinum and Herceptin. Her treatment began in May 2013. A total of 4 cycles of Taxotere carboplatinum and Herceptin combination are planned. Once she completes this she will then proceed to radiation therapy with concomitant Herceptin to be given every 3 weeks to finish out a year of treatment.  CURRENT THERAPY: Here for Herceptin  INTERVAL HISTORY: [name] 67 y.[name]. female returns for her next dose of Herceptin.  She continues to have some left breast swelling.  The redness has decreased.  She is doing well otherwise.  She has no chest pain, shortness of breath, fevers, chills, or any other concerns.    MEDICAL HISTORY: Past Medical History  Diagnosis Date  . Obesity   . PUD (peptic ulcer disease)   . Leukopenia   . Abdominal pain   . Short bowel syndrome   . Internal hemorrhoids without mention of complication   . Stricture and stenosis of esophagus   . Osteoarthrosis, unspecified whether generalized or localized, unspecified site   . Thyrotoxicosis without mention of goiter or other cause, without mention of thyrotoxic crisis or storm   . Other and unspecified hyperlipidemia   . Gout, unspecified   . Diverticulosis of colon (without mention of hemorrhage)   . C. difficile colitis   . VITAMIN B12 DEFICIENCY 08/30/2009  . GOITER, MULTINODULAR 04/02/2009  . HYPOTHYROIDISM, POST-RADIATION 08/13/2009  . ASYMPTOMATIC  POSTMENOPAUSAL STATUS 10/11/2008  . Esophageal reflux 06/12/2008  . PONV (postoperative nausea and vomiting)   . Varicose veins   . Type II or unspecified type diabetes mellitus without mention of complication, not stated as uncontrolled     no med in years diet controled  . Blood transfusion   . UTI (urinary tract infection)   . Kidney stones     "several"  . Unspecified essential hypertension   . Angina   . Shortness of breath on exertion     "sometimes"  . Anemia   . ANEMIA, IRON DEFICIENCY 05/08/2009  . History of lower GI bleeding   . H/[name] hiatal hernia   . Migraines   . Breast cancer 09/29/11    invasive grade III ductal ca,assoc high grade dcis,ER/PR=neg  . History of radiation therapy 02/08/12-03/25/12    left breast,total 61gy    ALLERGIES:  is allergic to aspirin; iodine; and morphine and related.  MEDICATIONS:  Current Outpatient Prescriptions  Medication Sig Dispense Refill  . Calcium Carbonate-Vitamin D (CALCIUM-VITAMIN D) 600-200 MG-UNIT CAPS Take 1 capsule by mouth daily.        . calcium gluconate 650 MG tablet Take 1 tablet (650 mg total) by mouth daily.  30 tablet  2  . cyanocobalamin (,VITAMIN B-12,) 1000 MCG/ML injection Inject 1,000 mcg into the muscle every 30 (thirty) days. Next one due the 26th of this month      . cyclobenzaprine (FLEXERIL) 5 MG tablet Take 5 mg by mouth Daily.      . fluorometholone (FML) 0.1 %  ophthalmic suspension Apply 1 drop to eye Twice daily.      Marland Kitchen gabapentin (NEURONTIN) 100 MG capsule Take 1 capsule (100 mg total) by mouth 2 times daily at 12 noon and 4 pm.  60 capsule  3  . levothyroxine (SYNTHROID, LEVOTHROID) 125 MCG tablet 1 TAB ONCE DAILY  30 tablet  5  . lidocaine-prilocaine (EMLA) cream Apply topically as needed.  30 g  0  . LORazepam (ATIVAN) 0.5 MG tablet Take 1 tablet (0.5 mg total) by mouth every 6 (six) hours as needed (Nausea or vomiting).  60 tablet  0  . lovastatin (MEVACOR) 20 MG tablet TAKE 1 TABLET EVERY DAY  30  tablet  2  . magnesium chloride (SLOW-MAG) 64 MG TBEC Take 1 tablet by mouth daily.       Marland Kitchen omeprazole (PRILOSEC) 40 MG capsule Take 1 capsule (40 mg total) by mouth daily.  30 capsule  6  . oxyCODONE-acetaminophen (PERCOCET/ROXICET) 5-325 MG per tablet Take 1-2 tablets by mouth every 6 (six) hours as needed for pain.  90 tablet  0  . promethazine (PHENERGAN) 25 MG tablet TAKE 1 TABLET BY MOUTH EVERY 8 HOURS AS NEEDED FOR NAUSEA  30 tablet  2  . spironolactone (ALDACTONE) 25 MG tablet Take 1 tablet (25 mg total) by mouth daily.  30 tablet  3  . thiamine 100 MG tablet Take 100 mg by mouth daily.        . trazodone (DESYREL) 300 MG tablet TAKE 1 TABLET (300 MG TOTAL) BY MOUTH AT BEDTIME.  30 tablet  5  . cephALEXin (KEFLEX) 500 MG capsule Take 1 capsule (500 mg total) by mouth 4 (four) times daily.  56 capsule  0   No current facility-administered medications for this visit.   Facility-Administered Medications Ordered in Other Visits  Medication Dose Route Frequency Provider Last Rate Last Dose  . heparin lock flush 100 unit/mL  500 Units Intracatheter Once PRN Augustin Schooling, NP      . sodium chloride 0.9 % injection 10 mL  10 mL Intracatheter PRN Augustin Schooling, NP        SURGICAL HISTORY:  Past Surgical History  Procedure Date  . Vein ligation and stripping 1980's    Right leg  . Lithotripsy     "4 or 5 times"  . Bunionectomy 1970's    bilateral  . Colonoscopy   . Esophagogastroduodenoscopy 07/15/2005  . Thyroid ultrasound 12/1994 and 12/1995  . Mastectomy w/ nodes partial 09/29/11    left  . Port a cath placement 09/29/11    right chest  . Breast surgery   . Cholecystectomy 1990's  . Abdominal hysterectomy 1970's    with BSO  . Dilation and curettage of uterus   . Colon surgery     "several surgeries for short bowel syndrome"  . Abdominal adhesion surgery 1980's thru 1990's    "several"  . Kidney stone surgery 1990's    "tried to go up & get it but pushed it further up"   . Portacath placement 09/29/2011    Procedure: INSERTION PORT-A-CATH;  Surgeon: Ernestene Mention, MD;  Location: [facility] OR;  Service: General;  Laterality: N/A;  . Breast biopsy 08/13/11    left breast lower inner quadrant  . Breast lumpectomy w/ needle localization 09/29/11    left  breast=lymph node,excision benign/ ER/PR=neg, her 2 Positive    REVIEW OF SYSTEMS:   General: fatigue (-), night sweats (-), fever (-), pain (+) Lymph: palpable nodes (-)  HEENT: vision changes (-), mucositis (-), gum bleeding (-), epistaxis (-) Cardiovascular: chest pain (-), palpitations (-) Pulmonary: shortness of breath (-), dyspnea on exertion (-), cough (-), hemoptysis (-) GI:  Early satiety (-), melena (-), dysphagia (-), nausea/vomiting (-), diarrhea (-) GU: dysuria (-), hematuria (-), incontinence (-) Musculoskeletal: joint swelling (-), joint pain (-), back pain (-) Neuro: weakness (-), numbness (-), headache (-), confusion (-) Skin: Rash (-), lesions (-), dryness (-) Psych: depression (-), suicidal/homicidal ideation (-), feeling of hopelessness (-)   PHYSICAL EXAMINATION:  BP 127/84  Pulse 84  Temp 97.5 F (36.4 C)  Resp 20  Ht 5\' 5"  (1.651 m)  Wt 210 lb 9.6 oz (95.528 kg)  BMI 35.05 kg/m2 General: Patient is a well appearing female in no acute distress HEENT: PERRLA, sclerae anicteric no conjunctival pallor, MMM Neck: supple, no palpable adenopathy Lungs: clear to auscultation bilaterally, no wheezes, rhonchi, or rales Cardiovascular: regular rate rhythm, S1, S2, no murmurs, rubs or gallops Abdomen: Soft, non-tender, non-distended, normoactive bowel sounds, no HSM Extremities: warm and well perfused, no clubbing, cyanosis, or edema Skin: No rashes or lesions Neuro: Non-focal Bilateral Breast Exam: left breast healing incision scar, with some tenderness, and radiation skin changes, left breast swollen erythema has resolved. Right breast no masses or nipple discharge ECOG PERFORMANCE STATUS:  1 - Symptomatic but completely ambulatory  LABORATORY DATA: Lab Results  Component Value Date   WBC 3.7* 06/15/2012   HGB 10.1* 06/15/2012   HCT 30.5* 06/15/2012   MCV 90.8 06/15/2012   PLT 185 06/15/2012      Chemistry      Component Value Date/Time   NA 139 05/23/2012 0927   NA 141 01/20/2012 1139   K 3.7 05/23/2012 0927   K 3.0* 01/20/2012 1139   CL 105 05/23/2012 0927   CL 103 01/20/2012 1139   CO2 23 05/23/2012 0927   CO2 23 01/20/2012 1139   BUN 18.0 05/23/2012 0927   BUN 14 01/20/2012 1139   CREATININE 1.0 05/23/2012 0927   CREATININE 0.86 01/20/2012 1139   CREATININE 0.88 03/31/2011 1550      Component Value Date/Time   CALCIUM 8.9 05/23/2012 0927   CALCIUM 6.7* 01/20/2012 1139   CALCIUM 9.5 05/15/2010 2153   ALKPHOS 71 05/23/2012 0927   ALKPHOS 105 01/20/2012 1139   AST 23 05/23/2012 0927   AST 24 01/20/2012 1139   ALT 19 05/23/2012 0927   ALT 13 01/20/2012 1139   BILITOT 0.66 05/23/2012 0927   BILITOT 0.8 01/20/2012 1139     ADDITIONAL INFORMATION: 1. PROGNOSTIC INDICATORS - ACIS Results IMMUNOHISTOCHEMICAL AND MORPHOMETRIC ANALYSIS BY THE AUTOMATED CELLULAR IMAGING SYSTEM (ACIS) Estrogen Receptor (Negative, <1%): 0%, NEGATIVE Progesterone Receptor (Negative, <1%): 0%, NEGATIVE COMMENT: The negative hormone receptor study(ies) in this case have an internal positive control. All controls stained appropriately Rachel Miyamoto MD Pathologist, Electronic Signature ( Signed 10/07/2011) FINAL DIAGNOSIS 1 of 4 FINAL for [name], [name] ([id number]) Diagnosis 1. Breast, lumpectomy, Left - INVASIVE GRADE III, DUCTAL CARCINOMA, SPANNING 0.7 CM. - ASSOCIATED HIGH GRADED DUCTAL CARCINOMA IN SITU. - LYMPH/VASCULAR INVASION NOT IDENTIFIED. - MARGINS ARE NEGATIVE. - SEE ONCOLOGY TEMPLATE. 2. Lymph node, sentinel, biopsy, Left axillary#1 - ONE BENIGN LYMPH NODE WITH NO TUMOR (0/1). - BENIGN GLANDULAR EPITHELIAL INCLUSIONS PRESENT. - SEE COMMENT. 3. Breast,  excision, Posterior margin - BENIGN BREAST PARENCHYMA. - NO ATYPIA, HYPERPLASIA, OR MALIGNANCY IDENTIFIED. 4. Lymph node, sentinel, biopsy, Left axillary #2 - ONE BENIGN LYMPH NODE WITH NO TUMOR SEEN (  0/1). Microscopic Comment 1. BREAST, INVASIVE TUMOR, WITH LYMPH NODE SAMPLING Specimen, including laterality: Left breast with posterior margin and sentinel lymph nodes. Procedure: Left breast lumpectomy with posterior margin excision and sentinel lymph node biopsies. Grade: III. Tubule formation: 3. Nuclear pleomorphism: 3. Mitotic:3. Tumor size (gross measurement and glass slide measurement): 0.7 cm. Margins: Invasive, distance to closest margin: At least 0.8 cm. In-situ, distance to closest margin: At least 0.8 cm. Lymphovascular invasion: Not identified. Ductal carcinoma in situ: Yes. Grade: High grade. Extensive intraductal component: No. Lobular neoplasia: No. Tumor focality: Unifocal. Treatment effect: N/A. Extent of tumor: Confined to breast parenchyma. Lymph nodes: # examined: 2. Lymph nodes with metastasis: 0. Breast prognostic profile: Performed on previous case ([id number]) Estrogen receptor: 0%, negative. Progesterone receptor: 0%, negative. Her 2 neu: 3.09, amplified. Ki-67: 53%. Non-neoplastic breast: Fat necrosis present. TNM: pT1b, pN0, MX. Comments: An estrogen receptor and progesterone receptor will be repeated on the current tumor and reported in an addendum. (RAH:gt, 10/01/11) 2. The left sentinel axillary lymph node #1 shows benign glandular epithelial inclusions. Some of the inclusions are ciliated. The nuclei are bland appearing and are not malignant. Smooth muscle myosin, p63 and calponin immunohistochemical stains are performed which do not show a myoepithelial layer. 2 of 4 FINAL for [name], [name] ([id number]) Microscopic Comment(continued) Although this is the case, the inclusions are benign and do not represent metastatic carcinoma. Both  Dr. Frederica Kuster and Dr. Colonel Bald have seen the left sentinel axillary lymph node in consultation with agreement that the inclusions are benign and do not represent metastatic carcinoma. Zandra Abts MD Pathologist, Electronic Signature (Case signed 10/02/2011) Specimen Gross and Clinical Information Specimen(s) Obtained: 1. Breast, lumpectomy, Left 2. Lymph node, sentinel, biopsy, Left axillary#1 3. Breast, excision, Posterior margin 4. Lymph node, sentinel, biopsy, Left axillary #2 Specimen Clinical  RADIOGRAPHIC STUDIES:  ASSESSMENT: 67 year old with   1. 0.7 cm high grade invasive ductal carcinoma that is ER-, PR- Her2Neu +, sentinel node negative (T1bN0) pathologic stage I.Because patient is HER-2 positive she is receiving adjuvant chemotherapy and Herceptin. She is going to be receiving 4 cycles of TCH combination and then Herceptin alone to complete out 1years worth of treatment.  2.Receiving radiation therapy  3. Left knee pain and left leg swelling patient at this time does not want to have her knee replacement surgery she wants to wait until she completes all of her Herceptin.  4. Anemia due to chemotherapy  5. Pain  6. Left breast swelling   PLAN:   #1 Ms. Nova will receive her next dose of Herceptin today. We will see her back in 3 weeks for her next appt and treatment.  She is due for her echo at the end of the month, I have ordered it, and requested f/u with Dr. Gala Romney.   #2 The erythema on Ms. Bruss's left breast has resolved.  She continues to have swelling and occasional pain.  I have ordered a mammo, ultrasound, and f/u with her surgeon.    #3 she will return in 3 weeks' time for next dose of Herceptin. Her anticipated Herceptin completion is May 2014.  All questions were answered. The patient knows to call the clinic with any problems, questions or concerns. We can certainly see the patient much sooner if necessary.  I spent 25 minutes counseling the patient  face to face. The total time spent in the appointment was 30 minutes.  This case was reviewed with Dr. Welton Flakes.    Cherie Ouch  Lyn Hollingshead, NP Medical Oncology [facility] Phone: [phone]  06/15/2012, 10:44 AM

## 2012-06-15 NOTE — Patient Instructions (Signed)
[facility] Discharge Instructions for Patients Receiving Chemotherapy  Today you received the following chemotherapy agents: herceptin  To help prevent nausea and vomiting after your treatment, we encourage you to take your nausea medication.  Take it as often as prescribed.     If you develop nausea and vomiting that is not controlled by your nausea medication, call the clinic. If it is after clinic hours your family physician or the after hours number for the clinic or go to the Emergency Department.   BELOW ARE SYMPTOMS THAT SHOULD BE REPORTED IMMEDIATELY:  *FEVER GREATER THAN 100.5 F  *CHILLS WITH OR WITHOUT FEVER  NAUSEA AND VOMITING THAT IS NOT CONTROLLED WITH YOUR NAUSEA MEDICATION  *UNUSUAL SHORTNESS OF BREATH  *UNUSUAL BRUISING OR BLEEDING  TENDERNESS IN MOUTH AND THROAT WITH OR WITHOUT PRESENCE OF ULCERS  *URINARY PROBLEMS  *BOWEL PROBLEMS  UNUSUAL RASH Items with * indicate a potential emergency and should be followed up as soon as possible.  Feel free to call the clinic you have any questions or concerns. The clinic phone number is [phone].   I have been informed and understand all the instructions given to me. I know to contact the clinic, my physician, or go to the Emergency Department if any problems should occur. I do not have any questions at this time, but understand that I may call the clinic during office hours   should I have any questions or need assistance in obtaining follow up care.    __________________________________________  _____________  __________ Signature of Patient or Authorized Representative            Date                   Time    __________________________________________ Nurse's Signature    

## 2012-06-15 NOTE — Telephone Encounter (Signed)
Per staff message and POF I have scheduled appts.  JMW  

## 2012-06-15 NOTE — Patient Instructions (Signed)
Doing well.  Proceed with Herceptin.  I have ordered your echo, mammogram, and ultrasound.  You should get a follow up appointment with Dr. Gala Romney and Dr. [name] (after your mammogram and ultrasound).  We will see you back in 3 weeks.  Please call us if you have any questions or concerns.

## 2012-06-16 ENCOUNTER — Telehealth: Payer: Self-pay | Admitting: Medical Oncology

## 2012-06-16 NOTE — Telephone Encounter (Signed)
Patient LVMOM asking why she doesn't have an appt sooner than 08/01/12 for mammogram and ultrasound. Mssg forwarded to MD/NP.

## 2012-06-16 NOTE — Telephone Encounter (Signed)
please call the breast center for a sooner  appointment

## 2012-06-17 ENCOUNTER — Telehealth: Payer: Self-pay | Admitting: *Deleted

## 2012-06-17 ENCOUNTER — Other Ambulatory Visit: Payer: Self-pay | Admitting: Adult Health

## 2012-06-17 DIAGNOSIS — N63 Unspecified lump in unspecified breast: Secondary | ICD-10-CM

## 2012-06-17 NOTE — Telephone Encounter (Signed)
Called pt advised she will need to call [facility] and r/s for a sooner date/time. Pt advised she called [name] yesterday and was told we (the cancer center) would need to call and make this appt for her. Pt was unable to recall who she spoke to at breast center.  Called [employer] spoke with Charlynne Pander, who advised insurance will not cover a Bilateral mammo/US as pt is not due until 08/01/12. Insurance will cover unilateral on Left side and this is side pt complaining of swelling. Request to change order from Bilateral to Unilateral.  Will review with MD

## 2012-06-17 NOTE — Telephone Encounter (Signed)
Orders are in for left diagnostic mammo and left breast ultrasound

## 2012-06-20 ENCOUNTER — Telehealth: Payer: Self-pay | Admitting: Oncology

## 2012-06-20 NOTE — Telephone Encounter (Signed)
Pt added to schedule for mammo/US at [facility] 1/27 and appt w/Dr. [name] scheduled for 1/31. Per desk nurse on 1/16 appts needed to be in January. S/w LA to change order on 1/16. appts now complete. S/w pt confirming mammo/US for 1/27 and per pt [employer] had contacted her. Pt given appt w/Dr. [name] for 1/31 @ 9:45 am to arrive 9:15 am (s/w Bernie) and appt for echo/Dr. Bensimhon 2/6 (1st available - date ok per LA). Pt also reminded of 2/5 appt here at [facility]. appts for mammo/us/Dr. [name] in March left as is. Pt is due for annual bilat mammo in March and [facility] can let pt know 1/31 if she needs to keep 3/25 appt w/Dr. [name].

## 2012-06-27 ENCOUNTER — Ambulatory Visit
Admission: RE | Admit: 2012-06-27 | Discharge: 2012-06-27 | Disposition: A | Discharge status: Home / Self Care | Payer: Medicare Other | Source: Ambulatory Visit | Attending: Adult Health | Admitting: Adult Health

## 2012-06-27 DIAGNOSIS — N63 Unspecified lump in unspecified breast: Secondary | ICD-10-CM

## 2012-07-01 ENCOUNTER — Ambulatory Visit (INDEPENDENT_AMBULATORY_CARE_PROVIDER_SITE_OTHER): Payer: Medicare Other | Admitting: Endocrinology

## 2012-07-01 ENCOUNTER — Encounter (INDEPENDENT_AMBULATORY_CARE_PROVIDER_SITE_OTHER): Payer: Self-pay | Admitting: General Surgery

## 2012-07-01 ENCOUNTER — Encounter: Payer: Self-pay | Admitting: Endocrinology

## 2012-07-01 ENCOUNTER — Ambulatory Visit (INDEPENDENT_AMBULATORY_CARE_PROVIDER_SITE_OTHER): Payer: Medicare Other | Admitting: General Surgery

## 2012-07-01 VITALS — BP 126/84 | HR 68 | Temp 97.4°F | Resp 16 | Ht 65.0 in | Wt 207.4 lb

## 2012-07-01 VITALS — BP 122/80 | HR 77 | Wt 207.0 lb

## 2012-07-01 DIAGNOSIS — E042 Nontoxic multinodular goiter: Secondary | ICD-10-CM

## 2012-07-01 DIAGNOSIS — E890 Postprocedural hypothyroidism: Secondary | ICD-10-CM

## 2012-07-01 DIAGNOSIS — I10 Essential (primary) hypertension: Secondary | ICD-10-CM

## 2012-07-01 DIAGNOSIS — E785 Hyperlipidemia, unspecified: Secondary | ICD-10-CM

## 2012-07-01 DIAGNOSIS — E119 Type 2 diabetes mellitus without complications: Secondary | ICD-10-CM

## 2012-07-01 DIAGNOSIS — D509 Iron deficiency anemia, unspecified: Secondary | ICD-10-CM

## 2012-07-01 DIAGNOSIS — Z0000 Encounter for general adult medical examination without abnormal findings: Secondary | ICD-10-CM

## 2012-07-01 DIAGNOSIS — M109 Gout, unspecified: Secondary | ICD-10-CM

## 2012-07-01 DIAGNOSIS — C50319 Malignant neoplasm of lower-inner quadrant of unspecified female breast: Secondary | ICD-10-CM

## 2012-07-01 LAB — URINALYSIS, ROUTINE W REFLEX MICROSCOPIC
Bilirubin Urine: NEGATIVE
Ketones, ur: NEGATIVE
Leukocytes, UA: NEGATIVE
Urobilinogen, UA: 0.2 (ref 0.0–1.0)

## 2012-07-01 LAB — MICROALBUMIN / CREATININE URINE RATIO
Creatinine,U: 209 mg/dL
Microalb Creat Ratio: 0.3 mg/g (ref 0.0–30.0)

## 2012-07-01 LAB — LIPID PANEL
LDL Cholesterol: 46 mg/dL (ref 0–99)
Total CHOL/HDL Ratio: 2

## 2012-07-01 LAB — TSH: TSH: 5.62 u[IU]/mL — ABNORMAL HIGH (ref 0.35–5.50)

## 2012-07-01 LAB — HEMOGLOBIN A1C: Hgb A1c MFr Bld: 5.7 % (ref 4.6–6.5)

## 2012-07-01 MED ORDER — CYANOCOBALAMIN 1000 MCG/ML IJ SOLN: 1000.0000 ug | Freq: Once | INTRAMUSCULAR | Status: DC

## 2012-07-01 NOTE — Patient Instructions (Addendum)
please consider these measures for your health:  minimize alcohol.  do not use tobacco products.  have a colonoscopy at least every 10 years from age 69.  Women should have an annual mammogram from age 49.  keep firearms safely stored.  always use seat belts.  have working smoke alarms in your home.  see an eye doctor and dentist regularly.  never drive under the influence of alcohol or drugs (including prescription drugs).   blood tests are being requested for you today.  We'll contact you with results. Please come back for a follow-up appointment in 1 year.

## 2012-07-01 NOTE — Progress Notes (Signed)
Subjective:    Patient ID: [name], female    DOB: 1945-08-04, 67 y.o.   MRN: [id number]  HPI here for regular wellness examination.  she's feeling pretty well in general, and says chronic med probs are stable, except as noted below Past Medical History  Diagnosis Date  . Obesity   . PUD (peptic ulcer disease)   . Leukopenia   . Abdominal pain   . Short bowel syndrome   . Internal hemorrhoids without mention of complication   . Stricture and stenosis of esophagus   . Osteoarthrosis, unspecified whether generalized or localized, unspecified site   . Thyrotoxicosis without mention of goiter or other cause, without mention of thyrotoxic crisis or storm   . Other and unspecified hyperlipidemia   . Gout, unspecified   . Diverticulosis of colon (without mention of hemorrhage)   . C. difficile colitis   . VITAMIN B12 DEFICIENCY 08/30/2009  . GOITER, MULTINODULAR 04/02/2009  . HYPOTHYROIDISM, POST-RADIATION 08/13/2009  . ASYMPTOMATIC POSTMENOPAUSAL STATUS 10/11/2008  . Esophageal reflux 06/12/2008  . PONV (postoperative nausea and vomiting)   . Varicose veins   . Type II or unspecified type diabetes mellitus without mention of complication, not stated as uncontrolled     no med in years diet controled  . Blood transfusion   . UTI (urinary tract infection)   . Kidney stones     "several"  . Unspecified essential hypertension   . Angina   . Shortness of breath on exertion     "sometimes"  . Anemia   . ANEMIA, IRON DEFICIENCY 05/08/2009  . History of lower GI bleeding   . H/O hiatal hernia   . Migraines   . Breast cancer 09/29/11    invasive grade III ductal ca,assoc high grade dcis,ER/PR=neg  . History of radiation therapy 02/08/12-03/25/12    left breast,total 59gy    Past Surgical History  Procedure Date  . Vein ligation and stripping 1980's    Right leg  . Lithotripsy     "4 or 5 times"  . Bunionectomy 1970's    bilateral  . Colonoscopy   .  Esophagogastroduodenoscopy 07/15/2005  . Thyroid ultrasound 12/1994 and 12/1995  . Mastectomy w/ nodes partial 09/29/11    left  . Port a cath placement 09/29/11    right chest  . Breast surgery   . Cholecystectomy 1990's  . Abdominal hysterectomy 1970's    with BSO  . Dilation and curettage of uterus   . Colon surgery     "several surgeries for short bowel syndrome"  . Abdominal adhesion surgery 1980's thru 1990's    "several"  . Kidney stone surgery 1990's    "tried to go up & get it but pushed it further up"  . Portacath placement 09/29/2011    Procedure: INSERTION PORT-A-CATH;  Surgeon: Ernestene Mention, MD;  Location: [facility] OR;  Service: General;  Laterality: N/A;  . Breast biopsy 08/13/11    left breast lower inner quadrant  . Breast lumpectomy w/ needle localization 09/29/11    left  breast=lymph node,excision benign/ ER/PR=neg, her 2 Positive    History   Social History  . Marital Status: Married    Spouse Name: N/A    Number of Children: N/A  . Years of Education: N/A   Occupational History  . Not on file.   Social History Main Topics  . Smoking status: Former Smoker -- 1.0 packs/day for 10 years    Types: Cigarettes    Quit  date: 09/22/1985  . Smokeless tobacco: Never Used  . Alcohol Use: No     Comment: 09/29/11 "used to drink socially years ago"  . Drug Use: No  . Sexually Active: No     Comment: HRT x many yrs   Other Topics Concern  . Not on file   Social History Narrative   Pt gets regular exercise    Current Outpatient Prescriptions on File Prior to Visit  Medication Sig Dispense Refill  . Calcium Carbonate-Vitamin D (CALCIUM-VITAMIN D) 600-200 MG-UNIT CAPS Take 1 capsule by mouth daily.        . calcium gluconate 650 MG tablet Take 1 tablet (650 mg total) by mouth daily.  30 tablet  2  . cyanocobalamin (,VITAMIN B-12,) 1000 MCG/ML injection Inject 1,000 mcg into the muscle every 30 (thirty) days. Next one due the 26th of this month      .  cyclobenzaprine (FLEXERIL) 5 MG tablet Take 5 mg by mouth Daily.      . fluorometholone (FML) 0.1 % ophthalmic suspension Apply 1 drop to eye Twice daily.      Marland Kitchen gabapentin (NEURONTIN) 100 MG capsule Take 1 capsule (100 mg total) by mouth 2 times daily at 12 noon and 4 pm.  60 capsule  3  . levothyroxine (SYNTHROID, LEVOTHROID) 125 MCG tablet 1 TAB ONCE DAILY  30 tablet  5  . lidocaine-prilocaine (EMLA) cream Apply topically as needed.  30 g  0  . LORazepam (ATIVAN) 0.5 MG tablet Take 1 tablet (0.5 mg total) by mouth every 6 (six) hours as needed (Nausea or vomiting).  60 tablet  0  . lovastatin (MEVACOR) 20 MG tablet TAKE 1 TABLET EVERY DAY  30 tablet  2  . magnesium chloride (SLOW-MAG) 64 MG TBEC Take 1 tablet by mouth daily.       Marland Kitchen omeprazole (PRILOSEC) 40 MG capsule Take 1 capsule (40 mg total) by mouth daily.  30 capsule  6  . promethazine (PHENERGAN) 25 MG tablet TAKE 1 TABLET BY MOUTH EVERY 8 HOURS AS NEEDED FOR NAUSEA  30 tablet  2  . spironolactone (ALDACTONE) 25 MG tablet Take 1 tablet (25 mg total) by mouth daily.  30 tablet  3  . thiamine 100 MG tablet Take 100 mg by mouth daily.        . trazodone (DESYREL) 300 MG tablet TAKE 1 TABLET (300 MG TOTAL) BY MOUTH AT BEDTIME.  30 tablet  5   No current facility-administered medications on file prior to visit.    Allergies  Allergen Reactions  . Aspirin Other (See Comments)    REACTION: Gi Intolerance/ Burning in stomach  . Iodine Itching    Allergic to IVP dye  . Morphine And Related Itching    Family History  Problem Relation Age of Onset  . Esophageal cancer Son     deceased  . Cancer Son     Esophageal Cancer  . Diabetes Mother   . Heart disease Mother   . Colon cancer Paternal Uncle   . Cancer Paternal Uncle     Colon Cancer  . Kidney disease Sister   . Diabetes Father   . Hypertension Father   . Kidney disease Brother   . Kidney disease Brother   . Kidney disease Brother     BP 122/80  Pulse 77  Wt 207 lb  (93.895 kg)  SpO2 97%     Review of Systems  Constitutional: Negative for fever.  HENT: Negative for  hearing loss.   Eyes: Negative for visual disturbance.  Respiratory: Negative for shortness of breath.   Cardiovascular: Negative for chest pain.  Gastrointestinal: Negative for anal bleeding.  Genitourinary: Negative for hematuria.  Musculoskeletal: Negative for back pain.  Skin: Negative for rash.  Neurological: Negative for syncope, numbness and headaches.  Hematological: Does not bruise/bleed easily.  Psychiatric/Behavioral: Negative for dysphoric mood.       Objective:   Physical Exam VS: see vs page GEN: no distress HEAD: head: no deformity eyes: no periorbital swelling, no proptosis external nose and ears are normal mouth: no lesion seen NECK: supple, thyroid is not enlarged CHEST WALL: no deformity LUNGS:  Clear to auscultation BREASTS:  refused CV: reg rate and rhythm, no murmur ABD: abdomen is soft, nontender.  no hepatosplenomegaly.  not distended.  no hernia.  Old healed surgical scars GENITALIA/RECTAL: redused MUSCULOSKELETAL: muscle bulk and strength are grossly normal.  no obvious joint swelling.  gait is normal and steady EXTEMITIES: no deformity.  no ulcer on the feet.  feet are of normal color and temp.  Trace bilat leg edema.  Old healed surgical scars (bilat bunionectomies) PULSES: dorsalis pedis intact bilat.  no carotid bruit NEURO:  cn 2-12 grossly intact.   readily moves all 4's.  sensation is intact to touch on the feet SKIN:  Normal texture and temperature.  No rash or suspicious lesion is visible.   NODES:  None palpable at the neck PSYCH: alert, oriented x3.  Does not appear anxious nor depressed.      Assessment & Plan:  Wellness visit today, with problems stable, except as noted.

## 2012-07-01 NOTE — Progress Notes (Signed)
Patient ID: [name], female   DOB: 1945/11/20, 67 y.o.   MRN: [id number] History: This patient was referred back to me by Dr. Welton Flakes to check on some swelling in the left breast. On 09/29/2011 she underwent left partial mastectomy, sentinel node biopsy and Port-A-Cath insertion. 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 Pathologic stage TI B. N0. Following adjuvant chemotherapy she underwent radiation therapy the radiation therapy was completed in November. She continues to take Herceptin chemotherapy. She states that after the radiation therapy she developed swelling in the left breast. This has concerned her. There is no arm swelling or shoulder symptoms. A left breast mammogram on 06/27/2012 is completely benign.  Exam: Patient is here with her husband. She has a flattened affect and seems somewhat unhappy. She is in no distress. Neck no adenopathy or masses no JVD Breasts left breast is examined. It is moderately large. Perhaps slight edema but no erythema, no sig. tenderness, wounds well healed. No focal area of thickening. No focal hematoma or seroma. No apparent surgical problems.  Assessment: Left breast swelling, probably sterile inflammatory changes related to radiation therapy, at least by history. There is no surgical problem or fluid collection that requires drainage. Suspect symptoms will be self-limited Invasive ductal carcinoma left breast, lower inner quadrant, stage TI B., N0, receptor negtive, HER-2 positive.  Plan: I reassured the patient and her husband Continue Herceptin chemotherapy Remove Port-A-Cath once Dr. Welton Flakes approves of this Bilateral mammograms in April 2014 Return to see me in 3 months after mammograms are done.   [name]. [name], M.D., [facility] General and Minimally invasive Surgery Breast and Colorectal Surgery Office:   [phone] Pager:   [phone]

## 2012-07-01 NOTE — Patient Instructions (Addendum)
The swelling in your left breast is probably just a little bit of edema related to your radiation therapy. There is no infection. There is no fluid collection that needs to be drained. There is no evidence of cancer.  This will slowly improve over the next 3-4 months.  Your recent left breast mammogram shows no significant abnormality  Return to see Dr. [name] in 3 months, after you get your annual bilateral mammograms.

## 2012-07-06 ENCOUNTER — Telehealth: Payer: Self-pay | Admitting: Oncology

## 2012-07-06 ENCOUNTER — Telehealth: Payer: Self-pay | Admitting: Medical Oncology

## 2012-07-06 ENCOUNTER — Other Ambulatory Visit (HOSPITAL_BASED_OUTPATIENT_CLINIC_OR_DEPARTMENT_OTHER): Payer: Medicare Other | Admitting: Lab

## 2012-07-06 ENCOUNTER — Ambulatory Visit (HOSPITAL_BASED_OUTPATIENT_CLINIC_OR_DEPARTMENT_OTHER): Payer: Medicare Other

## 2012-07-06 ENCOUNTER — Ambulatory Visit (HOSPITAL_BASED_OUTPATIENT_CLINIC_OR_DEPARTMENT_OTHER): Payer: Medicare Other | Admitting: Oncology

## 2012-07-06 VITALS — BP 119/86 | HR 80 | Temp 98.1°F | Resp 20 | Ht 65.0 in | Wt 210.1 lb

## 2012-07-06 DIAGNOSIS — C50319 Malignant neoplasm of lower-inner quadrant of unspecified female breast: Secondary | ICD-10-CM

## 2012-07-06 DIAGNOSIS — M79609 Pain in unspecified limb: Secondary | ICD-10-CM

## 2012-07-06 DIAGNOSIS — Z171 Estrogen receptor negative status [ER-]: Secondary | ICD-10-CM

## 2012-07-06 DIAGNOSIS — D6481 Anemia due to antineoplastic chemotherapy: Secondary | ICD-10-CM

## 2012-07-06 DIAGNOSIS — Z5112 Encounter for antineoplastic immunotherapy: Secondary | ICD-10-CM

## 2012-07-06 DIAGNOSIS — C50919 Malignant neoplasm of unspecified site of unspecified female breast: Secondary | ICD-10-CM

## 2012-07-06 LAB — COMPREHENSIVE METABOLIC PANEL (CC13)
ALT: 19 U/L (ref 0–55)
Alkaline Phosphatase: 72 U/L (ref 40–150)
CO2: 24 mEq/L (ref 22–29)
Sodium: 140 mEq/L (ref 136–145)
Total Bilirubin: 0.96 mg/dL (ref 0.20–1.20)
Total Protein: 6.9 g/dL (ref 6.4–8.3)

## 2012-07-06 LAB — CBC WITH DIFFERENTIAL/PLATELET
Eosinophils Absolute: 0.1 10*3/uL (ref 0.0–0.5)
MCV: 90.1 fL (ref 79.5–101.0)
MONO#: 0.4 10*3/uL (ref 0.1–0.9)
MONO%: 9 % (ref 0.0–14.0)
NEUT#: 2.1 10*3/uL (ref 1.5–6.5)
RBC: 3.23 10*6/uL — ABNORMAL LOW (ref 3.70–5.45)
RDW: 13.4 % (ref 11.2–14.5)
WBC: 3.9 10*3/uL (ref 3.9–10.3)
nRBC: 0 % (ref 0–0)

## 2012-07-06 MED ORDER — HEPARIN SOD (PORK) LOCK FLUSH 100 UNIT/ML IV SOLN
500.0000 [IU] | Freq: Once | INTRAVENOUS | Status: AC | PRN
  Administered 2012-07-06: 500 [IU]
  Filled 2012-07-06: qty 5

## 2012-07-06 MED ORDER — SODIUM CHLORIDE 0.9 % IV SOLN
Freq: Once | INTRAVENOUS | Status: AC
  Administered 2012-07-06: 15:00:00 via INTRAVENOUS

## 2012-07-06 MED ORDER — DIPHENHYDRAMINE HCL 25 MG PO CAPS
50.0000 mg | ORAL_CAPSULE | Freq: Once | ORAL | Status: AC
  Administered 2012-07-06: 50 mg via ORAL

## 2012-07-06 MED ORDER — ACETAMINOPHEN 325 MG PO TABS
650.0000 mg | ORAL_TABLET | Freq: Once | ORAL | Status: AC
  Administered 2012-07-06: 650 mg via ORAL

## 2012-07-06 MED ORDER — SODIUM CHLORIDE 0.9 % IV SOLN
6.0000 mg/kg | Freq: Once | INTRAVENOUS | Status: AC
  Administered 2012-07-06: 567 mg via INTRAVENOUS
  Filled 2012-07-06: qty 27

## 2012-07-06 MED ORDER — POTASSIUM CHLORIDE CRYS ER 20 MEQ PO TBCR: 20.0000 meq | EXTENDED_RELEASE_TABLET | Freq: Every day | ORAL | Status: DC

## 2012-07-06 MED ORDER — SODIUM CHLORIDE 0.9 % IJ SOLN
10.0000 mL | INTRAMUSCULAR | Status: DC | PRN
  Administered 2012-07-06: 10 mL
  Filled 2012-07-06: qty 10

## 2012-07-06 NOTE — Telephone Encounter (Signed)
Message copied by Rexene Edison on Wed Jul 06, 2012  4:04 PM ------      Message from: [name]      Created: Wed Jul 06, 2012  3:56 PM       Call patient: start patient on K-dur 20 meq po daily  X 7 days

## 2012-07-06 NOTE — Patient Instructions (Addendum)
Proceed with herceptin  We will see you back in 3 weeks

## 2012-07-06 NOTE — Telephone Encounter (Signed)
LVMOM with pt's mobile phone. Per MD, patient to start K-Dur 20 meq by mouth daily for 7 days. Prescription sent to patient's listed pharmacy. Asked patient to call back to let us know she recv'd message.

## 2012-07-06 NOTE — Progress Notes (Signed)
Quick Note:  Call patient: start patient on K-dur 20 meq po daily X 7 days ______

## 2012-07-06 NOTE — Patient Instructions (Addendum)
[facility] Discharge Instructions for Patients Receiving Chemotherapy  Today you received the following chemotherapy agents Herceptin.  To help prevent nausea and vomiting after your treatment, we encourage you to take your nausea medication as prescribed.   If you develop nausea and vomiting that is not controlled by your nausea medication, call the clinic. If it is after clinic hours your family physician or the after hours number for the clinic or go to the Emergency Department.   BELOW ARE SYMPTOMS THAT SHOULD BE REPORTED IMMEDIATELY:  *FEVER GREATER THAN 100.5 F  *CHILLS WITH OR WITHOUT FEVER  NAUSEA AND VOMITING THAT IS NOT CONTROLLED WITH YOUR NAUSEA MEDICATION  *UNUSUAL SHORTNESS OF BREATH  *UNUSUAL BRUISING OR BLEEDING  TENDERNESS IN MOUTH AND THROAT WITH OR WITHOUT PRESENCE OF ULCERS  *URINARY PROBLEMS  *BOWEL PROBLEMS  UNUSUAL RASH Items with * indicate a potential emergency and should be followed up as soon as possible.  Feel free to call the clinic you have any questions or concerns. The clinic phone number is [phone].   I have been informed and understand all the instructions given to me. I know to contact the clinic, my physician, or go to the Emergency Department if any problems should occur. I do not have any questions at this time, but understand that I may call the clinic during office hours   should I have any questions or need assistance in obtaining follow up care.    __________________________________________  _____________  __________ Signature of Patient or Authorized Representative            Date                   Time    __________________________________________ Nurse's Signature    

## 2012-07-06 NOTE — Telephone Encounter (Signed)
pe 2/5 pof f/u as scheduled.

## 2012-07-07 ENCOUNTER — Ambulatory Visit (HOSPITAL_BASED_OUTPATIENT_CLINIC_OR_DEPARTMENT_OTHER)
Admission: RE | Admit: 2012-07-07 | Discharge: 2012-07-07 | Disposition: A | Discharge status: Home / Self Care | Payer: Medicare Other | Source: Ambulatory Visit | Attending: Internal Medicine | Admitting: Internal Medicine

## 2012-07-07 ENCOUNTER — Ambulatory Visit (HOSPITAL_COMMUNITY)
Admission: RE | Admit: 2012-07-07 | Discharge: 2012-07-07 | Disposition: A | Discharge status: Home / Self Care | Payer: Medicare Other | Source: Ambulatory Visit | Attending: Internal Medicine | Admitting: Internal Medicine

## 2012-07-07 VITALS — BP 124/78 | HR 82 | Wt 211.0 lb

## 2012-07-07 DIAGNOSIS — I77819 Aortic ectasia, unspecified site: Secondary | ICD-10-CM | POA: Insufficient documentation

## 2012-07-07 DIAGNOSIS — C50319 Malignant neoplasm of lower-inner quadrant of unspecified female breast: Secondary | ICD-10-CM

## 2012-07-07 DIAGNOSIS — Z09 Encounter for follow-up examination after completed treatment for conditions other than malignant neoplasm: Secondary | ICD-10-CM | POA: Insufficient documentation

## 2012-07-07 DIAGNOSIS — C50919 Malignant neoplasm of unspecified site of unspecified female breast: Secondary | ICD-10-CM

## 2012-07-07 DIAGNOSIS — I517 Cardiomegaly: Secondary | ICD-10-CM

## 2012-07-07 DIAGNOSIS — Z79899 Other long term (current) drug therapy: Secondary | ICD-10-CM

## 2012-07-07 NOTE — Patient Instructions (Addendum)
Follow up in 3 months with an ECHO and Dr Bensimhon 

## 2012-07-07 NOTE — Assessment & Plan Note (Addendum)
Dr Gala Romney reviewed and discussed ECHO results during office visit. EF and lateral S' stable. No evidence of cardiotoxicity. Follow up in 3 months with an ECHO.   Patient seen and examined with Tonye Becket, NP. We discussed all aspects of the encounter. I agree with the assessment and plan as stated above. I reviewed echos personally. EF and Doppler parameters stable. No HF on exam. Continue Herceptin.

## 2012-07-07 NOTE — Progress Notes (Signed)
  Echocardiogram 2D Echocardiogram has been performed.  [name] 07/07/2012, 9:52 AM

## 2012-07-07 NOTE — Progress Notes (Signed)
Patient ID: [name], female   DOB: [birth year], 67 y.o.   MRN: [id number] PCP: Dr. Everardo All Oncologist: Dr. Welton Flakes  HPI:   Ms. Alfredo is a 67 year old female with h/o obesity, short bowel syndrome, diet-controlled DM2, CP s/p multiple Myoviews Last in 11/12 (EF 61% normal perfusion).   Diagnosed May 2013 : stage I ER-/PR-/Her2Neu positive invasive ductal breast cancer  PRIOR THERAPY:  1. 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 ER negative PR negative Her2 Neu positive with Ki -67 53%,  2. S/P porta cath palcement for chemotherapy  #3 patient has begun her adjuvant chemotherapy consisting of Taxotere carboplatinum and Herceptin. Her treatment began in May 2013. A total of 4 cycles of Taxotere carboplatinum and Herceptin combination are planned. Once she completes this she will then proceed to radiation therapy with concomitant Herceptin to be given every 3 weeks to finish out a year of treatment. Completion target  April 2014.    Echo: 11/12 EF 50-55% lateral s' 8.4 (second peak) Echo: 4/13 EF 45-50% lateral s' 8.5 (second peak - 1st peak not seen) Echo:12/24/11  EF 50-55% lateral s' 8.7 ECHO: 03/28/12 EF 55% lateral S' unreadable but does not appear depressed. MR mild ECHO 07/07/12 EF 50-55% lateral S' 8.2  She returns for follow up. Denies PND/Orthopnea. Dyspnea going up steps. Denies lower extremity edema. Not walking due to cold weather.     Past Medical History  Diagnosis Date  . Obesity   . PUD (peptic ulcer disease)   . Leukopenia   . Abdominal pain   . Short bowel syndrome   . Internal hemorrhoids without mention of complication   . Stricture and stenosis of esophagus   . Osteoarthrosis, unspecified whether generalized or localized, unspecified site   . Thyrotoxicosis without mention of goiter or other cause, without mention of thyrotoxic crisis or storm   . Other and unspecified  hyperlipidemia   . Gout, unspecified   . Diverticulosis of colon (without mention of hemorrhage)   . C. difficile colitis   . VITAMIN B12 DEFICIENCY 08/30/2009  . GOITER, MULTINODULAR 04/02/2009  . HYPOTHYROIDISM, POST-RADIATION 08/13/2009  . ASYMPTOMATIC POSTMENOPAUSAL STATUS 10/11/2008  . Esophageal reflux 06/12/2008  . PONV (postoperative nausea and vomiting)   . Varicose veins   . Type II or unspecified type diabetes mellitus without mention of complication, not stated as uncontrolled     no med in years diet controled  . Blood transfusion   . UTI (urinary tract infection)   . Kidney stones     "several"  . Unspecified essential hypertension   . Angina   . Shortness of breath on exertion     "sometimes"  . Anemia   . ANEMIA, IRON DEFICIENCY 05/08/2009  . History of lower GI bleeding   . H/O hiatal hernia   . Migraines   . Breast cancer 09/29/11    invasive grade III ductal ca,assoc high grade dcis,ER/PR=neg  . History of radiation therapy 02/08/12-03/25/12    left breast,total 59gy    Current Outpatient Prescriptions  Medication Sig Dispense Refill  . Calcium Carbonate-Vitamin D (CALCIUM-VITAMIN D) 600-200 MG-UNIT CAPS Take 1 capsule by mouth daily.        . calcium gluconate 650 MG tablet Take 1 tablet (650 mg total) by mouth daily.  30 tablet  2  . cyanocobalamin (,VITAMIN B-12,) 1000 MCG/ML injection Inject 1,000 mcg into the muscle every  30 (thirty) days. Next one due the 26th of this month      . cyclobenzaprine (FLEXERIL) 5 MG tablet Take 5 mg by mouth Daily.      . fluorometholone (FML) 0.1 % ophthalmic suspension Apply 1 drop to eye Twice daily.      Marland Kitchen gabapentin (NEURONTIN) 100 MG capsule Take 1 capsule (100 mg total) by mouth 2 times daily at 12 noon and 4 pm.  60 capsule  3  . levothyroxine (SYNTHROID, LEVOTHROID) 125 MCG tablet 1 TAB ONCE DAILY  30 tablet  5  . lidocaine-prilocaine (EMLA) cream Apply topically as needed.  30 g  0  . LORazepam (ATIVAN) 0.5 MG tablet  Take 1 tablet (0.5 mg total) by mouth every 6 (six) hours as needed (Nausea or vomiting).  60 tablet  0  . lovastatin (MEVACOR) 20 MG tablet TAKE 1 TABLET EVERY DAY  30 tablet  2  . magnesium chloride (SLOW-MAG) 64 MG TBEC Take 1 tablet by mouth daily.       Marland Kitchen omeprazole (PRILOSEC) 40 MG capsule Take 1 capsule (40 mg total) by mouth daily.  30 capsule  6  . oxyCODONE-acetaminophen (PERCOCET/ROXICET) 5-325 MG per tablet       . potassium chloride SA (K-DUR,KLOR-CON) 20 MEQ tablet Take 1 tablet (20 mEq total) by mouth daily. For 7 days  7 tablet  0  . promethazine (PHENERGAN) 25 MG tablet TAKE 1 TABLET BY MOUTH EVERY 8 HOURS AS NEEDED FOR NAUSEA  30 tablet  2  . spironolactone (ALDACTONE) 25 MG tablet Take 1 tablet (25 mg total) by mouth daily.  30 tablet  3  . thiamine 100 MG tablet Take 100 mg by mouth daily.        . trazodone (DESYREL) 300 MG tablet TAKE 1 TABLET (300 MG TOTAL) BY MOUTH AT BEDTIME.  30 tablet  5   Current Facility-Administered Medications  Medication Dose Route Frequency Provider Last Rate Last Dose  . cyanocobalamin ((VITAMIN B-12)) injection 1,000 mcg  1,000 mcg Intramuscular Once [name], MD         Allergies  Allergen Reactions  . Aspirin Other (See Comments)    REACTION: Gi Intolerance/ Burning in stomach  . Iodine Itching    Allergic to IVP dye  . Morphine And Related Itching    History   Social History  . Marital Status: Married    Spouse Name: N/A    Number of Children: N/A  . Years of Education: N/A   Occupational History  . Not on file.   Social History Main Topics  . Smoking status: Former Smoker -- 1.0 packs/day for 10 years    Types: Cigarettes    Quit date: 09/22/1985  . Smokeless tobacco: Never Used  . Alcohol Use: No     Comment: 09/29/11 "used to drink socially years ago"  . Drug Use: No  . Sexually Active: No     Comment: HRT x many yrs   Other Topics Concern  . Not on file   Social History Narrative   Pt gets regular  exercise    Family History  Problem Relation Age of Onset  . Esophageal cancer Son     deceased  . Cancer Son     Esophageal Cancer  . Diabetes Mother   . Heart disease Mother   . Colon cancer Paternal Uncle   . Cancer Paternal Uncle     Colon Cancer  . Kidney disease Sister   . Diabetes  Father   . Hypertension Father   . Kidney disease Brother   . Kidney disease Brother   . Kidney disease Brother     PHYSICAL EXAM: Filed Vitals:   07/07/12 0952  BP: 124/78  Pulse: 82   General:  Well appearing. No respiratory difficulty (husband present) HEENT: normal Neck: supple. no JVD. Carotids 2+ bilat; no bruits. No lymphadenopathy or thryomegaly appreciated. Porta cath in place Cor: PMI nondisplaced. Regular rate & rhythm. No rubs, gallops or murmurs. Lungs: clear Abdomen: obese soft, nontender, nondistended. No hepatosplenomegaly. No bruits or masses. Good bowel sounds. Extremities: no cyanosis, clubbing, rash, trace lower extremity edema Neuro: alert & oriented x 3, cranial nerves grossly intact. moves all 4 extremities w/o difficulty. Affect pleasant.   ASSESSMENT & PLAN:

## 2012-07-11 NOTE — Progress Notes (Signed)
OFFICE PROGRESS NOTE  CC  [name], MD [address] [place] [address] Dr. Claud Kelp  DIAGNOSIS: 67 year old female with new diagnosis of stage I ER-/PR-/Her2Neu positive invasive ductal breast cancer  PRIOR THERAPY: 1. 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 ER negative PR negative Her2 Neu positive with Ki -67 53%,   2. S/P porta cath palcement for chemotherapy  #3 patient has begun her adjuvant chemotherapy consisting of Taxotere carboplatinum and Herceptin. Her treatment began in May 2013. A total of 4 cycles of Taxotere carboplatinum and Herceptin combination are planned. Once she completes this she will then proceed to radiation therapy with concomitant Herceptin to be given every 3 weeks to finish out a year of treatment.  #4 patient received 4 cycles of Taxotere carboplatinum and Herceptin between 10/30/2011 to 11/2011.  #5 she then began Herceptin every 3 weeks starting on 01/20/2012. She will continue this still May 2014  CURRENT THERAPY: Here for Herceptin every 3 weeks  INTERVAL HISTORY: [name] 67 y.o. female returns for her next dose of Herceptin. Clinically patient is doing well she is without any complaints she has no aches or pains. She was seen by Dr. Claud Kelp was reassured her that there is no evidence of recurrent disease in her left breast. She herself denies nausea vomiting fevers chills night sweats headaches no shortness of breath or chest pains. She still continues to have her arthritic knee pain. But she is not willing to go see a orthopedic surgeon yet. She plans on doing this after she completes all of her therapy for her breast cancer MEDICAL HISTORY: Past Medical History  Diagnosis Date  . Obesity   . PUD (peptic ulcer disease)   . Leukopenia   . Abdominal pain   . Short bowel syndrome   . Internal hemorrhoids without mention  of complication   . Stricture and stenosis of esophagus   . Osteoarthrosis, unspecified whether generalized or localized, unspecified site   . Thyrotoxicosis without mention of goiter or other cause, without mention of thyrotoxic crisis or storm   . Other and unspecified hyperlipidemia   . Gout, unspecified   . Diverticulosis of colon (without mention of hemorrhage)   . C. difficile colitis   . VITAMIN B12 DEFICIENCY 08/30/2009  . GOITER, MULTINODULAR 04/02/2009  . HYPOTHYROIDISM, POST-RADIATION 08/13/2009  . ASYMPTOMATIC POSTMENOPAUSAL STATUS 10/11/2008  . Esophageal reflux 06/12/2008  . PONV (postoperative nausea and vomiting)   . Varicose veins   . Type II or unspecified type diabetes mellitus without mention of complication, not stated as uncontrolled     no med in years diet controled  . Blood transfusion   . UTI (urinary tract infection)   . Kidney stones     "several"  . Unspecified essential hypertension   . Angina   . Shortness of breath on exertion     "sometimes"  . Anemia   . ANEMIA, IRON DEFICIENCY 05/08/2009  . History of lower GI bleeding   . H/O hiatal hernia   . Migraines   . Breast cancer 09/29/11    invasive grade III ductal ca,assoc high grade dcis,ER/PR=neg  . History of radiation therapy 02/08/12-03/25/12    left breast,total 61gy    ALLERGIES:  is allergic to aspirin; iodine; and morphine and related.  MEDICATIONS:  Current Outpatient Prescriptions  Medication Sig Dispense Refill  . Calcium Carbonate-Vitamin  D (CALCIUM-VITAMIN D) 600-200 MG-UNIT CAPS Take 1 capsule by mouth daily.        . calcium gluconate 650 MG tablet Take 1 tablet (650 mg total) by mouth daily.  30 tablet  2  . cyanocobalamin (,VITAMIN B-12,) 1000 MCG/ML injection Inject 1,000 mcg into the muscle every 30 (thirty) days. Next one due the 26th of this month      . cyclobenzaprine (FLEXERIL) 5 MG tablet Take 5 mg by mouth Daily.      . fluorometholone (FML) 0.1 % ophthalmic suspension Apply  1 drop to eye Twice daily.      Marland Kitchen gabapentin (NEURONTIN) 100 MG capsule Take 1 capsule (100 mg total) by mouth 2 times daily at 12 noon and 4 pm.  60 capsule  3  . levothyroxine (SYNTHROID, LEVOTHROID) 125 MCG tablet 1 TAB ONCE DAILY  30 tablet  5  . lidocaine-prilocaine (EMLA) cream Apply topically as needed.  30 g  0  . LORazepam (ATIVAN) 0.5 MG tablet Take 1 tablet (0.5 mg total) by mouth every 6 (six) hours as needed (Nausea or vomiting).  60 tablet  0  . lovastatin (MEVACOR) 20 MG tablet TAKE 1 TABLET EVERY DAY  30 tablet  2  . magnesium chloride (SLOW-MAG) 64 MG TBEC Take 1 tablet by mouth daily.       Marland Kitchen omeprazole (PRILOSEC) 40 MG capsule Take 1 capsule (40 mg total) by mouth daily.  30 capsule  6  . oxyCODONE-acetaminophen (PERCOCET/ROXICET) 5-325 MG per tablet       . spironolactone (ALDACTONE) 25 MG tablet Take 1 tablet (25 mg total) by mouth daily.  30 tablet  3  . thiamine 100 MG tablet Take 100 mg by mouth daily.        . trazodone (DESYREL) 300 MG tablet TAKE 1 TABLET (300 MG TOTAL) BY MOUTH AT BEDTIME.  30 tablet  5  . potassium chloride SA (K-DUR,KLOR-CON) 20 MEQ tablet Take 1 tablet (20 mEq total) by mouth daily. For 7 days  7 tablet  0  . promethazine (PHENERGAN) 25 MG tablet TAKE 1 TABLET BY MOUTH EVERY 8 HOURS AS NEEDED FOR NAUSEA  30 tablet  2   Current Facility-Administered Medications  Medication Dose Route Frequency Provider Last Rate Last Dose  . cyanocobalamin ((VITAMIN B-12)) injection 1,000 mcg  1,000 mcg Intramuscular Once [name], MD        SURGICAL HISTORY:  Past Surgical History  Procedure Laterality Date  . Vein ligation and stripping  1980's    Right leg  . Lithotripsy      "4 or 5 times"  . Bunionectomy  1970's    bilateral  . Colonoscopy    . Esophagogastroduodenoscopy  07/15/2005  . Thyroid ultrasound  12/1994 and 12/1995  . Mastectomy w/ nodes partial  09/29/11    left  . Port a cath placement  09/29/11    right chest  . Breast surgery    .  Cholecystectomy  1990's  . Abdominal hysterectomy  1970's    with BSO  . Dilation and curettage of uterus    . Colon surgery      "several surgeries for short bowel syndrome"  . Abdominal adhesion surgery  1980's thru 1990's    "several"  . Kidney stone surgery  1990's    "tried to go up & get it but pushed it further up"  . Portacath placement  09/29/2011    Procedure: INSERTION PORT-A-CATH;  Surgeon: Ernestene Mention,  MD;  Location: [facility] OR;  Service: General;  Laterality: N/A;  . Breast biopsy  08/13/11    left breast lower inner quadrant  . Breast lumpectomy w/ needle localization  09/29/11    left  breast=lymph node,excision benign/ ER/PR=neg, her 2 Positive    REVIEW OF SYSTEMS:   General: fatigue (-), night sweats (-), fever (-), pain (+) Lymph: palpable nodes (-) HEENT: vision changes (-), mucositis (-), gum bleeding (-), epistaxis (-) Cardiovascular: chest pain (-), palpitations (-) Pulmonary: shortness of breath (-), dyspnea on exertion (-), cough (-), hemoptysis (-) GI:  Early satiety (-), melena (-), dysphagia (-), nausea/vomiting (-), diarrhea (-) GU: dysuria (-), hematuria (-), incontinence (-) Musculoskeletal: joint swelling (-), joint pain (-), back pain (-) Neuro: weakness (-), numbness (-), headache (-), confusion (-) Skin: Rash (-), lesions (-), dryness (-) Psych: depression (-), suicidal/homicidal ideation (-), feeling of hopelessness (-)   PHYSICAL EXAMINATION:  BP 119/86  Pulse 80  Temp(Src) 98.1 F (36.7 C)  Resp 20  Ht 5\' 5"  (1.651 m)  Wt 210 lb 1.6 oz (95.301 kg)  BMI 34.96 kg/m2 General: Patient is a well appearing female in no acute distress HEENT: PERRLA, sclerae anicteric no conjunctival pallor, MMM Neck: supple, no palpable adenopathy Lungs: clear to auscultation bilaterally, no wheezes, rhonchi, or rales Cardiovascular: regular rate rhythm, S1, S2, no murmurs, rubs or gallops Abdomen: Soft, non-tender, non-distended, normoactive bowel sounds, no  HSM Extremities: warm and well perfused, no clubbing, cyanosis, or edema Skin: No rashes or lesions Neuro: Non-focal Bilateral Breast Exam: left breast healing incision scar, with some tenderness, and radiation skin changes, left breast swollen erythema has resolved. Right breast no masses or nipple discharge ECOG PERFORMANCE STATUS: 1 - Symptomatic but completely ambulatory  LABORATORY DATA: Lab Results  Component Value Date   WBC 3.9 07/06/2012   HGB 9.7* 07/06/2012   HCT 29.1* 07/06/2012   MCV 90.1 07/06/2012   PLT 174 07/06/2012      Chemistry      Component Value Date/Time   NA 140 07/06/2012 1253   NA 141 01/20/2012 1139   K 3.1* 07/06/2012 1253   K 3.0* 01/20/2012 1139   CL 106 07/06/2012 1253   CL 103 01/20/2012 1139   CO2 24 07/06/2012 1253   CO2 23 01/20/2012 1139   BUN 21.2 07/06/2012 1253   BUN 14 01/20/2012 1139   CREATININE 1.0 07/06/2012 1253   CREATININE 0.86 01/20/2012 1139   CREATININE 0.88 03/31/2011 1550      Component Value Date/Time   CALCIUM 8.4 07/06/2012 1253   CALCIUM 6.7* 01/20/2012 1139   CALCIUM 9.5 05/15/2010 2153   ALKPHOS 72 07/06/2012 1253   ALKPHOS 105 01/20/2012 1139   AST 31 07/06/2012 1253   AST 24 01/20/2012 1139   ALT 19 07/06/2012 1253   ALT 13 01/20/2012 1139   BILITOT 0.96 07/06/2012 1253   BILITOT 0.8 01/20/2012 1139     ADDITIONAL INFORMATION: 1. PROGNOSTIC INDICATORS - ACIS Results IMMUNOHISTOCHEMICAL AND MORPHOMETRIC ANALYSIS BY THE AUTOMATED CELLULAR IMAGING SYSTEM (ACIS) Estrogen Receptor (Negative, <1%): 0%, NEGATIVE Progesterone Receptor (Negative, <1%): 0%, NEGATIVE COMMENT: The negative hormone receptor study(ies) in this case have an internal positive control. All controls stained appropriately Abigail Miyamoto MD Pathologist, Electronic Signature ( Signed 10/07/2011) FINAL DIAGNOSIS 1 of 4 FINAL for Mote, Jaiyana O ([id number]) Diagnosis 1. Breast, lumpectomy, Left - INVASIVE GRADE III, DUCTAL CARCINOMA, SPANNING 0.7 CM. - ASSOCIATED  HIGH GRADED DUCTAL CARCINOMA IN SITU. - LYMPH/VASCULAR INVASION  NOT IDENTIFIED. - MARGINS ARE NEGATIVE. - SEE ONCOLOGY TEMPLATE. 2. Lymph node, sentinel, biopsy, Left axillary#1 - ONE BENIGN LYMPH NODE WITH NO TUMOR (0/1). - BENIGN GLANDULAR EPITHELIAL INCLUSIONS PRESENT. - SEE COMMENT. 3. Breast, excision, Posterior margin - BENIGN BREAST PARENCHYMA. - NO ATYPIA, HYPERPLASIA, OR MALIGNANCY IDENTIFIED. 4. Lymph node, sentinel, biopsy, Left axillary #2 - ONE BENIGN LYMPH NODE WITH NO TUMOR SEEN (0/1). Microscopic Comment 1. BREAST, INVASIVE TUMOR, WITH LYMPH NODE SAMPLING Specimen, including laterality: Left breast with posterior margin and sentinel lymph nodes. Procedure: Left breast lumpectomy with posterior margin excision and sentinel lymph node biopsies. Grade: III. Tubule formation: 3. Nuclear pleomorphism: 3. Mitotic:3. Tumor size (gross measurement and glass slide measurement): 0.7 cm. Margins: Invasive, distance to closest margin: At least 0.8 cm. In-situ, distance to closest margin: At least 0.8 cm. Lymphovascular invasion: Not identified. Ductal carcinoma in situ: Yes. Grade: High grade. Extensive intraductal component: No. Lobular neoplasia: No. Tumor focality: Unifocal. Treatment effect: N/A. Extent of tumor: Confined to breast parenchyma. Lymph nodes: # examined: 2. Lymph nodes with metastasis: 0. Breast prognostic profile: Performed on previous case ([id number]) Estrogen receptor: 0%, negative. Progesterone receptor: 0%, negative. Her 2 neu: 3.09, amplified. Ki-67: 53%. Non-neoplastic breast: Fat necrosis present. TNM: pT1b, pN0, MX. Comments: An estrogen receptor and progesterone receptor will be repeated on the current tumor and reported in an addendum. (RAH:gt, 10/01/11) 2. The left sentinel axillary lymph node #1 shows benign glandular epithelial inclusions. Some of the inclusions are ciliated. The nuclei are bland appearing and are not malignant.  Smooth muscle myosin, p63 and calponin immunohistochemical stains are performed which do not show a myoepithelial layer. 2 of 4 FINAL for Knighton, MCKENZIE BOVE ([id number]) Microscopic Comment(continued) Although this is the case, the inclusions are benign and do not represent metastatic carcinoma. Both Dr. Frederica Kuster and Dr. Colonel Bald have seen the left sentinel axillary lymph node in consultation with agreement that the inclusions are benign and do not represent metastatic carcinoma. Zandra Abts MD Pathologist, Electronic Signature (Case signed 10/02/2011) Specimen Gross and Clinical Information Specimen(s) Obtained: 1. Breast, lumpectomy, Left 2. Lymph node, sentinel, biopsy, Left axillary#1 3. Breast, excision, Posterior margin 4. Lymph node, sentinel, biopsy, Left axillary #2 Specimen Clinical  RADIOGRAPHIC STUDIES:  ASSESSMENT: 67 year old with   1. 0.7 cm high grade invasive ductal carcinoma that is ER-, PR- Her2Neu +, sentinel node negative (T1bN0) pathologic stage I.Because patient is HER-2 positive she is receiving adjuvant chemotherapy and Herceptin. She is going to be receiving 4 cycles of TCH combination and then Herceptin alone to complete out 1years worth of treatment.  2.Receiving radiation therapy  3. Left knee pain and left leg swelling patient at this time does not want to have her knee replacement surgery she wants to wait until she completes all of her Herceptin.  4. Anemia due to chemotherapy  5. Pain  6. Left breast swelling patient was seen by Dr. Claud Kelp and she has been reassured that this is nothing dangerous.   PLAN:   #1 Ms. Cosey will receive her next dose of Herceptin today. We will see her back in 3 weeks for her next appt and treatment.  She is due for her echo at the end of the month, I have ordered it, and requested f/u with Dr. Gala Romney.   #2 The erythema on Ms. Feeser's left breast has resolved.  She continues to have swelling and  occasional pain.  I have ordered a mammo, ultrasound, and f/u with her surgeon.    #  3 she will return in 3 weeks' time for next dose of Herceptin. Her anticipated Herceptin completion is May 2014.  All questions were answered. The patient knows to call the clinic with any problems, questions or concerns. We can certainly see the patient much sooner if necessary.  I spent 25 minutes counseling the patient face to face. The total time spent in the appointment was 30 minutes.   Drue Second, MD Medical/Oncology [facility] [phone] (beeper) [phone] (Office)

## 2012-07-22 ENCOUNTER — Telehealth: Payer: Self-pay | Admitting: Medical Oncology

## 2012-07-22 ENCOUNTER — Other Ambulatory Visit: Payer: Self-pay | Admitting: Adult Health

## 2012-07-22 ENCOUNTER — Other Ambulatory Visit: Payer: Self-pay | Admitting: Medical Oncology

## 2012-07-22 MED ORDER — OXYCODONE-ACETAMINOPHEN 5-325 MG PO TABS: 1.0000 | ORAL_TABLET | ORAL | Status: DC | PRN

## 2012-07-22 NOTE — Telephone Encounter (Signed)
Pt LVMOM requesting pain medications. Per medication rec patient with prescription of oxycodone-acteaminophen 5-325, last ordered 07/06/12. Confirmed with patient this is the med she is requesting refill for.  Next sched appt 02/26 L/NP/tx. Informed patient will call her when prescription ready for pick up. Patient verbalized understanding. Mssg forwarded to NP/MD.

## 2012-07-22 NOTE — Telephone Encounter (Signed)
What type of pain does she experience and how often does she require the pain medication?    Thanks!L

## 2012-07-22 NOTE — Telephone Encounter (Signed)
Patient states pain is in her knees,as before, not a new pain, 10/10 on pain scale. Per NP, informed patient prescription to be filled, patient to follow up with orthopaedic MD for future pain medication r/t this. Patient voiced verbal understanding. No further questions at this time. States husband will p.u prescription.

## 2012-07-27 ENCOUNTER — Ambulatory Visit (HOSPITAL_BASED_OUTPATIENT_CLINIC_OR_DEPARTMENT_OTHER): Payer: Medicare Other | Admitting: Adult Health

## 2012-07-27 ENCOUNTER — Ambulatory Visit (HOSPITAL_BASED_OUTPATIENT_CLINIC_OR_DEPARTMENT_OTHER): Payer: Medicare Other

## 2012-07-27 ENCOUNTER — Encounter: Payer: Self-pay | Admitting: Adult Health

## 2012-07-27 ENCOUNTER — Other Ambulatory Visit (HOSPITAL_BASED_OUTPATIENT_CLINIC_OR_DEPARTMENT_OTHER): Payer: Medicare Other | Admitting: Lab

## 2012-07-27 VITALS — BP 115/78 | HR 82 | Temp 97.9°F | Resp 20 | Ht 65.0 in | Wt 208.5 lb

## 2012-07-27 DIAGNOSIS — C50319 Malignant neoplasm of lower-inner quadrant of unspecified female breast: Secondary | ICD-10-CM

## 2012-07-27 LAB — CBC WITH DIFFERENTIAL/PLATELET
BASO%: 0.3 % (ref 0.0–2.0)
EOS%: 0.9 % (ref 0.0–7.0)
LYMPH%: 30.1 % (ref 14.0–49.7)
MCH: 30.8 pg (ref 25.1–34.0)
MCHC: 33.6 g/dL (ref 31.5–36.0)
MCV: 91.6 fL (ref 79.5–101.0)
MONO%: 12.7 % (ref 0.0–14.0)
Platelets: 204 10*3/uL (ref 145–400)
RBC: 3.18 10*6/uL — ABNORMAL LOW (ref 3.70–5.45)
WBC: 3.6 10*3/uL — ABNORMAL LOW (ref 3.9–10.3)

## 2012-07-27 LAB — COMPREHENSIVE METABOLIC PANEL (CC13)
ALT: 17 U/L (ref 0–55)
Alkaline Phosphatase: 72 U/L (ref 40–150)
Sodium: 142 mEq/L (ref 136–145)
Total Bilirubin: 0.73 mg/dL (ref 0.20–1.20)
Total Protein: 7 g/dL (ref 6.4–8.3)

## 2012-07-27 MED ORDER — TRASTUZUMAB CHEMO INJECTION 440 MG
6.0000 mg/kg | Freq: Once | INTRAVENOUS | Status: AC
  Administered 2012-07-27: 567 mg via INTRAVENOUS
  Filled 2012-07-27: qty 27

## 2012-07-27 MED ORDER — SODIUM CHLORIDE 0.9 % IJ SOLN
10.0000 mL | INTRAMUSCULAR | Status: DC | PRN
  Administered 2012-07-27: 10 mL
  Filled 2012-07-27: qty 10

## 2012-07-27 MED ORDER — SODIUM CHLORIDE 0.9 % IV SOLN
Freq: Once | INTRAVENOUS | Status: AC
  Administered 2012-07-27: 11:00:00 via INTRAVENOUS

## 2012-07-27 MED ORDER — ACETAMINOPHEN 325 MG PO TABS
650.0000 mg | ORAL_TABLET | Freq: Once | ORAL | Status: AC
  Administered 2012-07-27: 650 mg via ORAL

## 2012-07-27 MED ORDER — HEPARIN SOD (PORK) LOCK FLUSH 100 UNIT/ML IV SOLN
500.0000 [IU] | Freq: Once | INTRAVENOUS | Status: AC | PRN
  Administered 2012-07-27: 500 [IU]
  Filled 2012-07-27: qty 5

## 2012-07-27 MED ORDER — DIPHENHYDRAMINE HCL 25 MG PO CAPS
50.0000 mg | ORAL_CAPSULE | Freq: Once | ORAL | Status: AC
  Administered 2012-07-27: 50 mg via ORAL

## 2012-07-27 NOTE — Patient Instructions (Addendum)
Doing well.  Proceed with Herceptin.  Please call us if you have any questions or concerns.    

## 2012-07-27 NOTE — Progress Notes (Signed)
OFFICE PROGRESS NOTE  CC  [name], MD [address] [place] [address] Dr. Claud Kelp  DIAGNOSIS: 67 year old female with new diagnosis of stage I ER-/PR-/Her2Neu positive invasive ductal breast cancer  PRIOR THERAPY: 1. 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 ER negative PR negative Her2 Neu positive with Ki -67 53%,   2. S/P porta cath palcement for chemotherapy  #3 patient has begun her adjuvant chemotherapy consisting of Taxotere carboplatinum and Herceptin. Her treatment began in May 2013. A total of 4 cycles of Taxotere carboplatinum and Herceptin combination are planned. Once she completes this she will then proceed to radiation therapy with concomitant Herceptin to be given every 3 weeks to finish out a year of treatment.  #4 patient received 4 cycles of Taxotere carboplatinum and Herceptin between 10/30/2011 to 11/2011.  #5 she then began Herceptin every 3 weeks starting on 01/20/2012. She will continue this still May 2014  CURRENT THERAPY: Here for Herceptin every 3 weeks  INTERVAL HISTORY: [name] 67 y.o. female returns for her next dose of Herceptin. She is feeling well.  She had an echocardiogram and Dr. Gala Romney appointment on 07/07/12 and he cleared her to continue her treatment.  She has some mild diarrhea that she thinks is related to her Diverticulitis.  She continues to have some mild tenderness in her breast that is slowly improving.  Otherwise, she denies fevers, chills, chest pain, swelling, or any other concerns.  A 10 point ROS is neg.    MEDICAL HISTORY: Past Medical History  Diagnosis Date  . Obesity   . PUD (peptic ulcer disease)   . Leukopenia   . Abdominal pain   . Short bowel syndrome   . Internal hemorrhoids without mention of complication   . Stricture and stenosis of esophagus   . Osteoarthrosis, unspecified whether generalized  or localized, unspecified site   . Thyrotoxicosis without mention of goiter or other cause, without mention of thyrotoxic crisis or storm   . Other and unspecified hyperlipidemia   . Gout, unspecified   . Diverticulosis of colon (without mention of hemorrhage)   . C. difficile colitis   . VITAMIN B12 DEFICIENCY 08/30/2009  . GOITER, MULTINODULAR 04/02/2009  . HYPOTHYROIDISM, POST-RADIATION 08/13/2009  . ASYMPTOMATIC POSTMENOPAUSAL STATUS 10/11/2008  . Esophageal reflux 06/12/2008  . PONV (postoperative nausea and vomiting)   . Varicose veins   . Type II or unspecified type diabetes mellitus without mention of complication, not stated as uncontrolled     no med in years diet controled  . Blood transfusion   . UTI (urinary tract infection)   . Kidney stones     "several"  . Unspecified essential hypertension   . Angina   . Shortness of breath on exertion     "sometimes"  . Anemia   . ANEMIA, IRON DEFICIENCY 05/08/2009  . History of lower GI bleeding   . H/O hiatal hernia   . Migraines   . Breast cancer 09/29/11    invasive grade III ductal ca,assoc high grade dcis,ER/PR=neg  . History of radiation therapy 02/08/12-03/25/12    left breast,total 61gy    ALLERGIES:  is allergic to aspirin; iodine; and morphine and related.  MEDICATIONS:  Current Outpatient Prescriptions  Medication Sig Dispense Refill  . Calcium Carbonate-Vitamin D (CALCIUM-VITAMIN D) 600-200 MG-UNIT CAPS Take 1 capsule by mouth daily.        Marland Kitchen  calcium gluconate 650 MG tablet Take 1 tablet (650 mg total) by mouth daily.  30 tablet  2  . cyanocobalamin (,VITAMIN B-12,) 1000 MCG/ML injection Inject 1,000 mcg into the muscle every 30 (thirty) days. Next one due the 26th of this month      . cyclobenzaprine (FLEXERIL) 5 MG tablet Take 5 mg by mouth Daily.      . fluorometholone (FML) 0.1 % ophthalmic suspension Apply 1 drop to eye Twice daily.      Marland Kitchen gabapentin (NEURONTIN) 100 MG capsule Take 1 capsule (100 mg total) by  mouth 2 times daily at 12 noon and 4 pm.  60 capsule  3  . levothyroxine (SYNTHROID, LEVOTHROID) 125 MCG tablet 1 TAB ONCE DAILY  30 tablet  5  . lidocaine-prilocaine (EMLA) cream Apply topically as needed.  30 g  0  . LORazepam (ATIVAN) 0.5 MG tablet Take 1 tablet (0.5 mg total) by mouth every 6 (six) hours as needed (Nausea or vomiting).  60 tablet  0  . lovastatin (MEVACOR) 20 MG tablet TAKE 1 TABLET EVERY DAY  30 tablet  2  . magnesium chloride (SLOW-MAG) 64 MG TBEC Take 1 tablet by mouth daily.       Marland Kitchen omeprazole (PRILOSEC) 40 MG capsule Take 1 capsule (40 mg total) by mouth daily.  30 capsule  6  . oxyCODONE-acetaminophen (PERCOCET/ROXICET) 5-325 MG per tablet Take 1 tablet by mouth every 4 (four) hours as needed for pain.  30 tablet  0  . spironolactone (ALDACTONE) 25 MG tablet Take 1 tablet (25 mg total) by mouth daily.  30 tablet  3  . thiamine 100 MG tablet Take 100 mg by mouth daily.        . trazodone (DESYREL) 300 MG tablet TAKE 1 TABLET (300 MG TOTAL) BY MOUTH AT BEDTIME.  30 tablet  5   Current Facility-Administered Medications  Medication Dose Route Frequency Provider Last Rate Last Dose  . cyanocobalamin ((VITAMIN B-12)) injection 1,000 mcg  1,000 mcg Intramuscular Once [name], MD       Facility-Administered Medications Ordered in Other Visits  Medication Dose Route Frequency Provider Last Rate Last Dose  . heparin lock flush 100 unit/mL  500 Units Intracatheter Once PRN Victorino December, MD      . sodium chloride 0.9 % injection 10 mL  10 mL Intracatheter PRN Victorino December, MD      . trastuzumab (HERCEPTIN) 567 mg in sodium chloride 0.9 % 250 mL chemo infusion  6 mg/kg (Treatment Plan Actual) Intravenous Once Victorino December, MD 554 mL/hr at 07/27/12 1051 567 mg at 07/27/12 1051    SURGICAL HISTORY:  Past Surgical History  Procedure Laterality Date  . Vein ligation and stripping  1980's    Right leg  . Lithotripsy      "4 or 5 times"  . Bunionectomy  1970's     bilateral  . Colonoscopy    . Esophagogastroduodenoscopy  07/15/2005  . Thyroid ultrasound  12/1994 and 12/1995  . Mastectomy w/ nodes partial  09/29/11    left  . Port a cath placement  09/29/11    right chest  . Breast surgery    . Cholecystectomy  1990's  . Abdominal hysterectomy  1970's    with BSO  . Dilation and curettage of uterus    . Colon surgery      "several surgeries for short bowel syndrome"  . Abdominal adhesion surgery  1980's thru 1990's    "  several"  . Kidney stone surgery  1990's    "tried to go up & get it but pushed it further up"  . Portacath placement  09/29/2011    Procedure: INSERTION PORT-A-CATH;  Surgeon: Ernestene Mention, MD;  Location: [facility] OR;  Service: General;  Laterality: N/A;  . Breast biopsy  08/13/11    left breast lower inner quadrant  . Breast lumpectomy w/ needle localization  09/29/11    left  breast=lymph node,excision benign/ ER/PR=neg, her 2 Positive    REVIEW OF SYSTEMS:   General: fatigue (-), night sweats (-), fever (-), pain (+) Lymph: palpable nodes (-) HEENT: vision changes (-), mucositis (-), gum bleeding (-), epistaxis (-) Cardiovascular: chest pain (-), palpitations (-) Pulmonary: shortness of breath (-), dyspnea on exertion (-), cough (-), hemoptysis (-) GI:  Early satiety (-), melena (-), dysphagia (-), nausea/vomiting (-), diarrhea (+) GU: dysuria (-), hematuria (-), incontinence (-) Musculoskeletal: joint swelling (-), joint pain (-), back pain (-) Neuro: weakness (-), numbness (-), headache (-), confusion (-) Skin: Rash (-), lesions (-), dryness (-) Psych: depression (-), suicidal/homicidal ideation (-), feeling of hopelessness (-)   PHYSICAL EXAMINATION:  BP 115/78  Pulse 82  Temp(Src) 97.9 F (36.6 C) (Oral)  Resp 20  Ht 5\' 5"  (1.651 m)  Wt 208 lb 8 oz (94.575 kg)  BMI 34.7 kg/m2 General: Patient is a well appearing female in no acute distress HEENT: PERRLA, sclerae anicteric no conjunctival pallor, MMM Neck: supple,  no palpable adenopathy Lungs: clear to auscultation bilaterally, no wheezes, rhonchi, or rales Cardiovascular: regular rate rhythm, S1, S2, no murmurs, rubs or gallops Abdomen: Soft, non-tender, non-distended, normoactive bowel sounds, no HSM Extremities: warm and well perfused, no clubbing, cyanosis, or edema Skin: No rashes or lesions Neuro: Non-focal Bilateral Breast Exam: left breast healing incision scar, with some tenderness, and radiation skin changes, left breast swollen erythema has resolved. Right breast no masses or nipple discharge ECOG PERFORMANCE STATUS: 1 - Symptomatic but completely ambulatory  LABORATORY DATA: Lab Results  Component Value Date   WBC 3.6* 07/27/2012   HGB 9.8* 07/27/2012   HCT 29.1* 07/27/2012   MCV 91.6 07/27/2012   PLT 204 07/27/2012      Chemistry      Component Value Date/Time   NA 142 07/27/2012 0849   NA 141 01/20/2012 1139   K 3.4* 07/27/2012 0849   K 3.0* 01/20/2012 1139   CL 108* 07/27/2012 0849   CL 103 01/20/2012 1139   CO2 23 07/27/2012 0849   CO2 23 01/20/2012 1139   BUN 19.5 07/27/2012 0849   BUN 14 01/20/2012 1139   CREATININE 1.1 07/27/2012 0849   CREATININE 0.86 01/20/2012 1139   CREATININE 0.88 03/31/2011 1550      Component Value Date/Time   CALCIUM 8.6 07/27/2012 0849   CALCIUM 6.7* 01/20/2012 1139   CALCIUM 9.5 05/15/2010 2153   ALKPHOS 72 07/27/2012 0849   ALKPHOS 105 01/20/2012 1139   AST 22 07/27/2012 0849   AST 24 01/20/2012 1139   ALT 17 07/27/2012 0849   ALT 13 01/20/2012 1139   BILITOT 0.73 07/27/2012 0849   BILITOT 0.8 01/20/2012 1139     ADDITIONAL INFORMATION: 1. PROGNOSTIC INDICATORS - ACIS Results IMMUNOHISTOCHEMICAL AND MORPHOMETRIC ANALYSIS BY THE AUTOMATED CELLULAR IMAGING SYSTEM (ACIS) Estrogen Receptor (Negative, <1%): 0%, NEGATIVE Progesterone Receptor (Negative, <1%): 0%, NEGATIVE COMMENT: The negative hormone receptor study(ies) in this case have an internal positive control. All controls stained  appropriately Abigail Miyamoto MD Pathologist,  Electronic Signature ( Signed 10/07/2011) FINAL DIAGNOSIS 1 of 4 FINAL for [place], Labella O ([id number]) Diagnosis 1. Breast, lumpectomy, Left - INVASIVE GRADE III, DUCTAL CARCINOMA, SPANNING 0.7 CM. - ASSOCIATED HIGH GRADED DUCTAL CARCINOMA IN SITU. - LYMPH/VASCULAR INVASION NOT IDENTIFIED. - MARGINS ARE NEGATIVE. - SEE ONCOLOGY TEMPLATE. 2. Lymph node, sentinel, biopsy, Left axillary#1 - ONE BENIGN LYMPH NODE WITH NO TUMOR (0/1). - BENIGN GLANDULAR EPITHELIAL INCLUSIONS PRESENT. - SEE COMMENT. 3. Breast, excision, Posterior margin - BENIGN BREAST PARENCHYMA. - NO ATYPIA, HYPERPLASIA, OR MALIGNANCY IDENTIFIED. 4. Lymph node, sentinel, biopsy, Left axillary #2 - ONE BENIGN LYMPH NODE WITH NO TUMOR SEEN (0/1). Microscopic Comment 1. BREAST, INVASIVE TUMOR, WITH LYMPH NODE SAMPLING Specimen, including laterality: Left breast with posterior margin and sentinel lymph nodes. Procedure: Left breast lumpectomy with posterior margin excision and sentinel lymph node biopsies. Grade: III. Tubule formation: 3. Nuclear pleomorphism: 3. Mitotic:3. Tumor size (gross measurement and glass slide measurement): 0.7 cm. Margins: Invasive, distance to closest margin: At least 0.8 cm. In-situ, distance to closest margin: At least 0.8 cm. Lymphovascular invasion: Not identified. Ductal carcinoma in situ: Yes. Grade: High grade. Extensive intraductal component: No. Lobular neoplasia: No. Tumor focality: Unifocal. Treatment effect: N/A. Extent of tumor: Confined to breast parenchyma. Lymph nodes: # examined: 2. Lymph nodes with metastasis: 0. Breast prognostic profile: Performed on previous case ([id number]) Estrogen receptor: 0%, negative. Progesterone receptor: 0%, negative. Her 2 neu: 3.09, amplified. Ki-67: 53%. Non-neoplastic breast: Fat necrosis present. TNM: pT1b, pN0, MX. Comments: An estrogen receptor and progesterone receptor  will be repeated on the current tumor and reported in an addendum. (RAH:gt, 10/01/11) 2. The left sentinel axillary lymph node #1 shows benign glandular epithelial inclusions. Some of the inclusions are ciliated. The nuclei are bland appearing and are not malignant. Smooth muscle myosin, p63 and calponin immunohistochemical stains are performed which do not show a myoepithelial layer. 2 of 4 FINAL for Antos, SUZI HERNAN ([id number]) Microscopic Comment(continued) Although this is the case, the inclusions are benign and do not represent metastatic carcinoma. Both Dr. Frederica Kuster and Dr. Colonel Bald have seen the left sentinel axillary lymph node in consultation with agreement that the inclusions are benign and do not represent metastatic carcinoma. Zandra Abts MD Pathologist, Electronic Signature (Case signed 10/02/2011) Specimen Gross and Clinical Information Specimen(s) Obtained: 1. Breast, lumpectomy, Left 2. Lymph node, sentinel, biopsy, Left axillary#1 3. Breast, excision, Posterior margin 4. Lymph node, sentinel, biopsy, Left axillary #2 Specimen Clinical  RADIOGRAPHIC STUDIES:  ASSESSMENT: 67 year old with   1. 0.7 cm high grade invasive ductal carcinoma that is ER-, PR- Her2Neu +, sentinel node negative (T1bN0) pathologic stage I.Because patient is HER-2 positive she is receiving adjuvant chemotherapy and Herceptin. She is going to be receiving 4 cycles of TCH combination and then Herceptin alone to complete out 1years worth of treatment.  2.Receiving radiation therapy  3. Left knee pain and left leg swelling patient at this time does not want to have her knee replacement surgery she wants to wait until she completes all of her Herceptin.  4. Anemia due to chemotherapy  5. Pain  6. Left breast swelling patient was seen by Dr. Claud Kelp and she has been reassured that this is nothing dangerous.   PLAN:   #1 [name] will receive her next dose of Herceptin today.   #2 The  erythema on Ms. Fooks's left breast has resolved.  I also encouraged her to f/u with Dr. Janee Morn regarding her diverticulitis.    #3  she will return in 3 weeks' time for next dose of Herceptin. Her anticipated Herceptin completion is May 2014.  All questions were answered. The patient knows to call the clinic with any problems, questions or concerns. We can certainly see the patient much sooner if necessary.  I spent 25 minutes counseling the patient face to face. The total time spent in the appointment was 30 minutes.  Cherie Ouch Lyn Hollingshead, NP Medical Oncology [facility] Phone: [phone]

## 2012-07-27 NOTE — Patient Instructions (Addendum)
[facility] Discharge Instructions for Patients Receiving Chemotherapy  Today you received the following chemotherapy agents herceptin  To help prevent nausea and vomiting after your treatment, we encourage you to take your nausea medication  and take it as often as prescribedIf you develop nausea and vomiting that is not controlled by your nausea medication, call the clinic. If it is after clinic hours your family physician or the after hours number for the clinic or go to the Emergency Department.   BELOW ARE SYMPTOMS THAT SHOULD BE REPORTED IMMEDIATELY:  *FEVER GREATER THAN 100.5 F  *CHILLS WITH OR WITHOUT FEVER  NAUSEA AND VOMITING THAT IS NOT CONTROLLED WITH YOUR NAUSEA MEDICATION  *UNUSUAL SHORTNESS OF BREATH  *UNUSUAL BRUISING OR BLEEDING  TENDERNESS IN MOUTH AND THROAT WITH OR WITHOUT PRESENCE OF ULCERS  *URINARY PROBLEMS  *BOWEL PROBLEMS  UNUSUAL RASH Items with * indicate a potential emergency and should be followed up as soon as possible.  One of the nurses will contact you 24 hours after your treatment. Please let the nurse know about any problems that you may have experienced. Feel free to call the clinic you have any questions or concerns. The clinic phone number is [phone].   I have been informed and understand all the instructions given to me. I know to contact the clinic, my physician, or go to the Emergency Department if any problems should occur. I do not have any questions at this time, but understand that I may call the clinic during office hours   should I have any questions or need assistance in obtaining follow up care.    __________________________________________  _____________  __________ Signature of Patient or Authorized Representative            Date                   Time    __________________________________________ Nurse's Signature    

## 2012-08-01 ENCOUNTER — Other Ambulatory Visit: Payer: Medicare Other

## 2012-08-01 ENCOUNTER — Ambulatory Visit (INDEPENDENT_AMBULATORY_CARE_PROVIDER_SITE_OTHER): Payer: Medicare Other

## 2012-08-01 DIAGNOSIS — E538 Deficiency of other specified B group vitamins: Secondary | ICD-10-CM

## 2012-08-01 MED ORDER — CYANOCOBALAMIN 1000 MCG/ML IJ SOLN
1000.0000 ug | Freq: Once | INTRAMUSCULAR | Status: AC
  Administered 2012-08-01: 1000 ug via INTRAMUSCULAR

## 2012-08-02 ENCOUNTER — Ambulatory Visit: Payer: Medicare Other

## 2012-08-06 ENCOUNTER — Other Ambulatory Visit: Payer: Self-pay | Admitting: Oncology

## 2012-08-17 ENCOUNTER — Ambulatory Visit (HOSPITAL_BASED_OUTPATIENT_CLINIC_OR_DEPARTMENT_OTHER): Payer: Medicare Other | Admitting: Adult Health

## 2012-08-17 ENCOUNTER — Telehealth: Payer: Self-pay | Admitting: *Deleted

## 2012-08-17 ENCOUNTER — Encounter: Payer: Self-pay | Admitting: *Deleted

## 2012-08-17 ENCOUNTER — Encounter: Payer: Self-pay | Admitting: Adult Health

## 2012-08-17 ENCOUNTER — Ambulatory Visit (HOSPITAL_BASED_OUTPATIENT_CLINIC_OR_DEPARTMENT_OTHER): Payer: Medicare Other

## 2012-08-17 ENCOUNTER — Other Ambulatory Visit (HOSPITAL_BASED_OUTPATIENT_CLINIC_OR_DEPARTMENT_OTHER): Payer: Medicare Other | Admitting: Lab

## 2012-08-17 VITALS — BP 141/96 | HR 78 | Temp 97.9°F | Resp 20 | Ht 65.0 in | Wt 206.1 lb

## 2012-08-17 DIAGNOSIS — Z5112 Encounter for antineoplastic immunotherapy: Secondary | ICD-10-CM

## 2012-08-17 DIAGNOSIS — C50319 Malignant neoplasm of lower-inner quadrant of unspecified female breast: Secondary | ICD-10-CM

## 2012-08-17 LAB — CBC WITH DIFFERENTIAL/PLATELET
BASO%: 0.3 % (ref 0.0–2.0)
Eosinophils Absolute: 0.1 10*3/uL (ref 0.0–0.5)
HCT: 30.6 % — ABNORMAL LOW (ref 34.8–46.6)
LYMPH%: 30.2 % (ref 14.0–49.7)
MCHC: 33.3 g/dL (ref 31.5–36.0)
MCV: 89.7 fL (ref 79.5–101.0)
MONO%: 10.6 % (ref 0.0–14.0)
NEUT%: 57.5 % (ref 38.4–76.8)
Platelets: 190 10*3/uL (ref 145–400)
RBC: 3.41 10*6/uL — ABNORMAL LOW (ref 3.70–5.45)
nRBC: 0 % (ref 0–0)

## 2012-08-17 LAB — COMPREHENSIVE METABOLIC PANEL (CC13)
ALT: 19 U/L (ref 0–55)
BUN: 16.7 mg/dL (ref 7.0–26.0)
CO2: 24 mEq/L (ref 22–29)
Calcium: 9.7 mg/dL (ref 8.4–10.4)
Creatinine: 1.1 mg/dL (ref 0.6–1.1)
Total Bilirubin: 0.62 mg/dL (ref 0.20–1.20)

## 2012-08-17 MED ORDER — SODIUM CHLORIDE 0.9 % IV SOLN
Freq: Once | INTRAVENOUS | Status: AC
  Administered 2012-08-17: 12:00:00 via INTRAVENOUS

## 2012-08-17 MED ORDER — SODIUM CHLORIDE 0.9 % IJ SOLN
10.0000 mL | INTRAMUSCULAR | Status: DC | PRN
  Administered 2012-08-17: 10 mL
  Filled 2012-08-17: qty 10

## 2012-08-17 MED ORDER — ACETAMINOPHEN 325 MG PO TABS
650.0000 mg | ORAL_TABLET | Freq: Once | ORAL | Status: AC
  Administered 2012-08-17: 650 mg via ORAL

## 2012-08-17 MED ORDER — HEPARIN SOD (PORK) LOCK FLUSH 100 UNIT/ML IV SOLN
500.0000 [IU] | Freq: Once | INTRAVENOUS | Status: AC | PRN
  Administered 2012-08-17: 500 [IU]
  Filled 2012-08-17: qty 5

## 2012-08-17 MED ORDER — DIPHENHYDRAMINE HCL 25 MG PO CAPS
50.0000 mg | ORAL_CAPSULE | Freq: Once | ORAL | Status: AC
  Administered 2012-08-17: 50 mg via ORAL

## 2012-08-17 MED ORDER — TRASTUZUMAB CHEMO INJECTION 440 MG
6.0000 mg/kg | Freq: Once | INTRAVENOUS | Status: AC
  Administered 2012-08-17: 567 mg via INTRAVENOUS
  Filled 2012-08-17: qty 27

## 2012-08-17 NOTE — Telephone Encounter (Signed)
appt s made and print..the patient is aware that she will be called with her tx time for 5/50/14.

## 2012-08-17 NOTE — Progress Notes (Signed)
OFFICE PROGRESS NOTE  CC  Rachel Belling, MD [address] [place] [address] Dr. Claud Kelp  DIAGNOSIS: 66 year old female with new diagnosis of stage I ER-/PR-/Her2Neu positive invasive ductal breast cancer  PRIOR THERAPY: 1. 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 ER negative PR negative Her2 Neu positive with Ki -67 53%,   2. S/P porta cath palcement for chemotherapy  #3 patient has begun her adjuvant chemotherapy consisting of Taxotere carboplatinum and Herceptin. Her treatment began in May 2013. A total of 4 cycles of Taxotere carboplatinum and Herceptin combination are planned. Once she completes this she will then proceed to radiation therapy with concomitant Herceptin to be given every 3 weeks to finish out a year of treatment.  #4 patient received 4 cycles of Taxotere carboplatinum and Herceptin between 10/30/2011 to 11/2011.  #5 she then began Herceptin every 3 weeks starting on 01/20/2012. She will continue this still May 2014  CURRENT THERAPY: Here for Herceptin every 3 weeks  INTERVAL HISTORY: [name] 67 y.[name]. female returns for her next dose of Herceptin. She is feeling well.  She had an echocardiogram and Dr. Gala Romney appointment on 07/07/12 and he cleared her to continue her treatment.  She continues to have intermittent breast pain, and joint aches, but is otherwise feeling well.  She thinks everything will improve once the weather changes.  She denies fevers, chills, swelling, chest pain, palpitations, shortness of breath, or any other concerns.      MEDICAL HISTORY: Past Medical History  Diagnosis Date  . Obesity   . PUD (peptic ulcer disease)   . Leukopenia   . Abdominal pain   . Short bowel syndrome   . Internal hemorrhoids without mention of complication   . Stricture and stenosis of esophagus   . Osteoarthrosis, unspecified whether generalized  or localized, unspecified site   . Thyrotoxicosis without mention of goiter or other cause, without mention of thyrotoxic crisis or storm   . Other and unspecified hyperlipidemia   . Gout, unspecified   . Diverticulosis of colon (without mention of hemorrhage)   . C. difficile colitis   . VITAMIN B12 DEFICIENCY 08/30/2009  . GOITER, MULTINODULAR 04/02/2009  . HYPOTHYROIDISM, POST-RADIATION 08/13/2009  . ASYMPTOMATIC POSTMENOPAUSAL STATUS 10/11/2008  . Esophageal reflux 06/12/2008  . PONV (postoperative nausea and vomiting)   . Varicose veins   . Type II or unspecified type diabetes mellitus without mention of complication, not stated as uncontrolled     no med in years diet controled  . Blood transfusion   . UTI (urinary tract infection)   . Kidney stones     "several"  . Unspecified essential hypertension   . Angina   . Shortness of breath on exertion     "sometimes"  . Anemia   . ANEMIA, IRON DEFICIENCY 05/08/2009  . History of lower GI bleeding   . H/[name] hiatal hernia   . Migraines   . Breast cancer 09/29/11    invasive grade III ductal ca,assoc high grade dcis,ER/PR=neg  . History of radiation therapy 02/08/12-03/25/12    left breast,total 61gy    ALLERGIES:  is allergic to aspirin; iodine; and morphine and related.  MEDICATIONS:  Current Outpatient Prescriptions  Medication Sig Dispense Refill  . Calcium Carbonate-Vitamin D (CALCIUM-VITAMIN D) 600-200 MG-UNIT CAPS Take 1 capsule by mouth daily.        . calcium gluconate  650 MG tablet Take 1 tablet (650 mg total) by mouth daily.  30 tablet  2  . cyanocobalamin (,VITAMIN B-12,) 1000 MCG/ML injection Inject 1,000 mcg into the muscle every 30 (thirty) days. Next one due the 26th of this month      . cyclobenzaprine (FLEXERIL) 5 MG tablet Take 5 mg by mouth Daily.      . fluorometholone (FML) 0.1 % ophthalmic suspension Apply 1 drop to eye Twice daily.      Marland Kitchen gabapentin (NEURONTIN) 100 MG capsule Take 1 capsule (100 mg total) by  mouth 2 times daily at 12 noon and 4 pm.  60 capsule  3  . levothyroxine (SYNTHROID, LEVOTHROID) 125 MCG tablet 1 TAB ONCE DAILY  30 tablet  5  . lidocaine-prilocaine (EMLA) cream Apply topically as needed.  30 g  0  . lovastatin (MEVACOR) 20 MG tablet TAKE 1 TABLET EVERY DAY  30 tablet  2  . magnesium chloride (SLOW-MAG) 64 MG TBEC Take 1 tablet by mouth daily.       Marland Kitchen omeprazole (PRILOSEC) 40 MG capsule TAKE 1 CAPSULE (40 MG TOTAL) BY MOUTH DAILY.  30 capsule  6  . spironolactone (ALDACTONE) 25 MG tablet Take 1 tablet (25 mg total) by mouth daily.  30 tablet  3  . thiamine 100 MG tablet Take 100 mg by mouth daily.        . trazodone (DESYREL) 300 MG tablet TAKE 1 TABLET (300 MG TOTAL) BY MOUTH AT BEDTIME.  30 tablet  5  . LORazepam (ATIVAN) 0.5 MG tablet Take 1 tablet (0.5 mg total) by mouth every 6 (six) hours as needed (Nausea or vomiting).  60 tablet  0  . oxyCODONE-acetaminophen (PERCOCET/ROXICET) 5-325 MG per tablet Take 1 tablet by mouth every 4 (four) hours as needed for pain.  30 tablet  0   Current Facility-Administered Medications  Medication Dose Route Frequency Provider Last Rate Last Dose  . cyanocobalamin ((VITAMIN B-12)) injection 1,000 mcg  1,000 mcg Intramuscular Once Rachel Belling, MD        SURGICAL HISTORY:  Past Surgical History  Procedure Laterality Date  . Vein ligation and stripping  1980's    Right leg  . Lithotripsy      "4 or 5 times"  . Bunionectomy  1970's    bilateral  . Colonoscopy    . Esophagogastroduodenoscopy  07/15/2005  . Thyroid ultrasound  12/1994 and 12/1995  . Mastectomy w/ nodes partial  09/29/11    left  . Port a cath placement  09/29/11    right chest  . Breast surgery    . Cholecystectomy  1990's  . Abdominal hysterectomy  1970's    with BSO  . Dilation and curettage of uterus    . Colon surgery      "several surgeries for short bowel syndrome"  . Abdominal adhesion surgery  1980's thru 1990's    "several"  . Kidney stone surgery   1990's    "tried to go up & get it but pushed it further up"  . Portacath placement  09/29/2011    Procedure: INSERTION PORT-A-CATH;  Surgeon: Ernestene Mention, MD;  Location: [facility] OR;  Service: General;  Laterality: N/A;  . Breast biopsy  08/13/11    left breast lower inner quadrant  . Breast lumpectomy w/ needle localization  09/29/11    left  breast=lymph node,excision benign/ ER/PR=neg, her 2 Positive    REVIEW OF SYSTEMS:   General: fatigue (-), night sweats (-),  fever (-), pain (+) Lymph: palpable nodes (-) HEENT: vision changes (-), mucositis (-), gum bleeding (-), epistaxis (-) Cardiovascular: chest pain (-), palpitations (-) Pulmonary: shortness of breath (-), dyspnea on exertion (-), cough (-), hemoptysis (-) GI:  Early satiety (-), melena (-), dysphagia (-), nausea/vomiting (-), diarrhea (+) GU: dysuria (-), hematuria (-), incontinence (-) Musculoskeletal: joint swelling (-), joint pain (-), back pain (-) Neuro: weakness (-), numbness (-), headache (-), confusion (-) Skin: Rash (-), lesions (-), dryness (-) Psych: depression (-), suicidal/homicidal ideation (-), feeling of hopelessness (-)   PHYSICAL EXAMINATION:  BP 141/96  Pulse 78  Temp(Src) 97.9 F (36.6 C) (Oral)  Resp 20  Ht 5\' 5"  (1.651 m)  Wt 206 lb 1.6 oz (93.486 kg)  BMI 34.3 kg/m2 General: Patient is a well appearing female in no acute distress HEENT: PERRLA, sclerae anicteric no conjunctival pallor, MMM Neck: supple, no palpable adenopathy Lungs: clear to auscultation bilaterally, no wheezes, rhonchi, or rales Cardiovascular: regular rate rhythm, S1, S2, no murmurs, rubs or gallops Abdomen: Soft, non-tender, non-distended, normoactive bowel sounds, no HSM Extremities: warm and well perfused, no clubbing, cyanosis, or edema Skin: No rashes or lesions Neuro: Non-focal Bilateral Breast Exam: left breast healing incision scar, with some tenderness, and radiation skin changes, left breast swollen erythema has  resolved. Right breast no masses or nipple discharge ECOG PERFORMANCE STATUS: 1 - Symptomatic but completely ambulatory  LABORATORY DATA: Lab Results  Component Value Date   WBC 3.7* 08/17/2012   HGB 10.2* 08/17/2012   HCT 30.6* 08/17/2012   MCV 89.7 08/17/2012   PLT 190 08/17/2012      Chemistry      Component Value Date/Time   NA 142 07/27/2012 0849   NA 141 01/20/2012 1139   K 3.4* 07/27/2012 0849   K 3.0* 01/20/2012 1139   CL 108* 07/27/2012 0849   CL 103 01/20/2012 1139   CO2 23 07/27/2012 0849   CO2 23 01/20/2012 1139   BUN 19.5 07/27/2012 0849   BUN 14 01/20/2012 1139   CREATININE 1.1 07/27/2012 0849   CREATININE 0.86 01/20/2012 1139   CREATININE 0.88 03/31/2011 1550      Component Value Date/Time   CALCIUM 8.6 07/27/2012 0849   CALCIUM 6.7* 01/20/2012 1139   CALCIUM 9.5 05/15/2010 2153   ALKPHOS 72 07/27/2012 0849   ALKPHOS 105 01/20/2012 1139   AST 22 07/27/2012 0849   AST 24 01/20/2012 1139   ALT 17 07/27/2012 0849   ALT 13 01/20/2012 1139   BILITOT 0.73 07/27/2012 0849   BILITOT 0.8 01/20/2012 1139     ADDITIONAL INFORMATION: 1. PROGNOSTIC INDICATORS - ACIS Results IMMUNOHISTOCHEMICAL AND MORPHOMETRIC ANALYSIS BY THE AUTOMATED CELLULAR IMAGING SYSTEM (ACIS) Estrogen Receptor (Negative, <1%): 0%, NEGATIVE Progesterone Receptor (Negative, <1%): 0%, NEGATIVE COMMENT: The negative hormone receptor study(ies) in this case have an internal positive control. All controls stained appropriately Rachel Miyamoto MD Pathologist, Electronic Signature ( Signed 10/07/2011) FINAL DIAGNOSIS 1 of 4 FINAL for [name], [name] ([id number]) Diagnosis 1. Breast, lumpectomy, Left - INVASIVE GRADE III, DUCTAL CARCINOMA, SPANNING 0.7 CM. - ASSOCIATED HIGH GRADED DUCTAL CARCINOMA IN SITU. - LYMPH/VASCULAR INVASION NOT IDENTIFIED. - MARGINS ARE NEGATIVE. - SEE ONCOLOGY TEMPLATE. 2. Lymph node, sentinel, biopsy, Left axillary#1 - ONE BENIGN LYMPH NODE WITH NO TUMOR (0/1). - BENIGN  GLANDULAR EPITHELIAL INCLUSIONS PRESENT. - SEE COMMENT. 3. Breast, excision, Posterior margin - BENIGN BREAST PARENCHYMA. - NO ATYPIA, HYPERPLASIA, OR MALIGNANCY IDENTIFIED. 4. Lymph node, sentinel, biopsy, Left axillary #2 -  ONE BENIGN LYMPH NODE WITH NO TUMOR SEEN (0/1). Microscopic Comment 1. BREAST, INVASIVE TUMOR, WITH LYMPH NODE SAMPLING Specimen, including laterality: Left breast with posterior margin and sentinel lymph nodes. Procedure: Left breast lumpectomy with posterior margin excision and sentinel lymph node biopsies. Grade: III. Tubule formation: 3. Nuclear pleomorphism: 3. Mitotic:3. Tumor size (gross measurement and glass slide measurement): 0.7 cm. Margins: Invasive, distance to closest margin: At least 0.8 cm. In-situ, distance to closest margin: At least 0.8 cm. Lymphovascular invasion: Not identified. Ductal carcinoma in situ: Yes. Grade: High grade. Extensive intraductal component: No. Lobular neoplasia: No. Tumor focality: Unifocal. Treatment effect: N/A. Extent of tumor: Confined to breast parenchyma. Lymph nodes: # examined: 2. Lymph nodes with metastasis: 0. Breast prognostic profile: Performed on previous case ([id number]) Estrogen receptor: 0%, negative. Progesterone receptor: 0%, negative. Her 2 neu: 3.09, amplified. Ki-67: 53%. Non-neoplastic breast: Fat necrosis present. TNM: pT1b, pN0, MX. Comments: An estrogen receptor and progesterone receptor will be repeated on the current tumor and reported in an addendum. (RAH:gt, 10/01/11) 2. The left sentinel axillary lymph node #1 shows benign glandular epithelial inclusions. Some of the inclusions are ciliated. The nuclei are bland appearing and are not malignant. Smooth muscle myosin, p63 and calponin immunohistochemical stains are performed which do not show a myoepithelial layer. 2 of 4 FINAL for [name], [name] ([id number]) Microscopic Comment(continued) Although this is the case, the  inclusions are benign and do not represent metastatic carcinoma. Both Dr. Frederica Kuster and Dr. Colonel Bald have seen the left sentinel axillary lymph node in consultation with agreement that the inclusions are benign and do not represent metastatic carcinoma. Zandra Abts MD Pathologist, Electronic Signature (Case signed 10/02/2011) Specimen Gross and Clinical Information Specimen(s) Obtained: 1. Breast, lumpectomy, Left 2. Lymph node, sentinel, biopsy, Left axillary#1 3. Breast, excision, Posterior margin 4. Lymph node, sentinel, biopsy, Left axillary #2 Specimen Clinical  RADIOGRAPHIC STUDIES:  ASSESSMENT: 67 year old with   1. 0.7 cm high grade invasive ductal carcinoma that is ER-, PR- Her2Neu +, sentinel node negative (T1bN0) pathologic stage I.Because patient is HER-2 positive she is receiving adjuvant chemotherapy and Herceptin. She is going to be receiving 4 cycles of TCH combination and then Herceptin alone to complete out 1years worth of treatment.  2.Receiving radiation therapy  3. Left knee pain and left leg swelling patient at this time does not want to have her knee replacement surgery she wants to wait until she completes all of her Herceptin.  4. Anemia due to chemotherapy  5. Pain  6. Left breast swelling patient was seen by Dr. Claud Kelp and she has been reassured that this is nothing dangerous.   PLAN:   #1 Ms. Garduno will receive her next dose of Herceptin today.   #2 The erythema and swelling in Ms. Eilert's left breast has resolved.   #3 she will return in 3 weeks' time for next dose of Herceptin. Her anticipated Herceptin completion is May 2014.  All questions were answered. The patient knows to call the clinic with any problems, questions or concerns. We can certainly see the patient much sooner if necessary.  I spent 15 minutes counseling the patient face to face. The total time spent in the appointment was 30 minutes.  Cherie Ouch Lyn Hollingshead, NP Medical  Oncology [facility] Phone: [phone]

## 2012-08-17 NOTE — Progress Notes (Signed)
Patient in for office visit.  I met with patient to introduce myself has her Visual merchandiser.  Patient reports that she is doing well and denied any questions or concerns.  Contact information given and patient encouraged to call for any questions or concerns.

## 2012-08-17 NOTE — Patient Instructions (Addendum)
[facility] Discharge Instructions for Patients Receiving Chemotherapy  Today you received the following chemotherapy agents herceptin  To help prevent nausea and vomiting after your treatment, we encourage you to take your nausea medication as directed.   If you develop nausea and vomiting that is not controlled by your nausea medication, call the clinic. If it is after clinic hours your family physician or the after hours number for the clinic or go to the Emergency Department.   BELOW ARE SYMPTOMS THAT SHOULD BE REPORTED IMMEDIATELY:  *FEVER GREATER THAN 100.5 F  *CHILLS WITH OR WITHOUT FEVER  NAUSEA AND VOMITING THAT IS NOT CONTROLLED WITH YOUR NAUSEA MEDICATION  *UNUSUAL SHORTNESS OF BREATH  *UNUSUAL BRUISING OR BLEEDING  TENDERNESS IN MOUTH AND THROAT WITH OR WITHOUT PRESENCE OF ULCERS  *URINARY PROBLEMS  *BOWEL PROBLEMS  UNUSUAL RASH Items with * indicate a potential emergency and should be followed up as soon as possible.  One of the nurses will contact you 24 hours after your treatment. Please let the nurse know about any problems that you may have experienced. Feel free to call the clinic you have any questions or concerns. The clinic phone number is [phone].   I have been informed and understand all the instructions given to me. I know to contact the clinic, my physician, or go to the Emergency Department if any problems should occur. I do not have any questions at this time, but understand that I may call the clinic during office hours   should I have any questions or need assistance in obtaining follow up care.    __________________________________________  _____________  __________ Signature of Patient or Authorized Representative            Date                   Time    __________________________________________ Nurse's Signature

## 2012-08-17 NOTE — Patient Instructions (Signed)
Doing well.  Proceed with Herceptin.  We will see you back in 3 weeks.  Please call us if you have any questions or concerns.    

## 2012-08-23 ENCOUNTER — Encounter (INDEPENDENT_AMBULATORY_CARE_PROVIDER_SITE_OTHER): Payer: Medicare Other | Admitting: General Surgery

## 2012-09-05 ENCOUNTER — Other Ambulatory Visit: Payer: Self-pay

## 2012-09-05 ENCOUNTER — Other Ambulatory Visit: Payer: Self-pay | Admitting: *Deleted

## 2012-09-05 ENCOUNTER — Other Ambulatory Visit (HOSPITAL_COMMUNITY): Payer: Self-pay | Admitting: Adult Health

## 2012-09-05 MED ORDER — LEVOTHYROXINE SODIUM 125 MCG PO TABS: ORAL_TABLET | ORAL | Status: DC

## 2012-09-05 MED ORDER — LOVASTATIN 20 MG PO TABS: ORAL_TABLET | ORAL | Status: DC

## 2012-09-07 ENCOUNTER — Ambulatory Visit (HOSPITAL_BASED_OUTPATIENT_CLINIC_OR_DEPARTMENT_OTHER): Payer: Medicare Other

## 2012-09-07 ENCOUNTER — Ambulatory Visit (HOSPITAL_BASED_OUTPATIENT_CLINIC_OR_DEPARTMENT_OTHER): Payer: Medicare Other | Admitting: Adult Health

## 2012-09-07 ENCOUNTER — Other Ambulatory Visit (HOSPITAL_BASED_OUTPATIENT_CLINIC_OR_DEPARTMENT_OTHER): Payer: Medicare Other | Admitting: Lab

## 2012-09-07 ENCOUNTER — Encounter: Payer: Self-pay | Admitting: Adult Health

## 2012-09-07 VITALS — BP 128/82 | HR 80 | Temp 98.0°F | Resp 20 | Ht 65.0 in | Wt 205.6 lb

## 2012-09-07 DIAGNOSIS — C50912 Malignant neoplasm of unspecified site of left female breast: Secondary | ICD-10-CM

## 2012-09-07 DIAGNOSIS — Z5112 Encounter for antineoplastic immunotherapy: Secondary | ICD-10-CM

## 2012-09-07 DIAGNOSIS — R197 Diarrhea, unspecified: Secondary | ICD-10-CM

## 2012-09-07 DIAGNOSIS — C50319 Malignant neoplasm of lower-inner quadrant of unspecified female breast: Secondary | ICD-10-CM

## 2012-09-07 DIAGNOSIS — C50919 Malignant neoplasm of unspecified site of unspecified female breast: Secondary | ICD-10-CM

## 2012-09-07 DIAGNOSIS — R110 Nausea: Secondary | ICD-10-CM

## 2012-09-07 DIAGNOSIS — T451X5A Adverse effect of antineoplastic and immunosuppressive drugs, initial encounter: Secondary | ICD-10-CM

## 2012-09-07 LAB — COMPREHENSIVE METABOLIC PANEL (CC13)
Albumin: 3.7 g/dL (ref 3.5–5.0)
BUN: 20.4 mg/dL (ref 7.0–26.0)
CO2: 24 mEq/L (ref 22–29)
Glucose: 86 mg/dl (ref 70–99)
Sodium: 139 mEq/L (ref 136–145)
Total Bilirubin: 0.74 mg/dL (ref 0.20–1.20)
Total Protein: 7 g/dL (ref 6.4–8.3)

## 2012-09-07 LAB — CBC WITH DIFFERENTIAL/PLATELET
Basophils Absolute: 0 10*3/uL (ref 0.0–0.1)
Eosinophils Absolute: 0 10*3/uL (ref 0.0–0.5)
HCT: 30.3 % — ABNORMAL LOW (ref 34.8–46.6)
HGB: 10.1 g/dL — ABNORMAL LOW (ref 11.6–15.9)
MCV: 89.9 fL (ref 79.5–101.0)
MONO%: 8.9 % (ref 0.0–14.0)
NEUT#: 2.3 10*3/uL (ref 1.5–6.5)
NEUT%: 57.5 % (ref 38.4–76.8)
Platelets: 171 10*3/uL (ref 145–400)
RDW: 13.2 % (ref 11.2–14.5)

## 2012-09-07 MED ORDER — HEPARIN SOD (PORK) LOCK FLUSH 100 UNIT/ML IV SOLN
500.0000 [IU] | Freq: Once | INTRAVENOUS | Status: AC | PRN
  Administered 2012-09-07: 500 [IU]
  Filled 2012-09-07: qty 5

## 2012-09-07 MED ORDER — SODIUM CHLORIDE 0.9 % IV SOLN: Freq: Once | INTRAVENOUS | Status: DC

## 2012-09-07 MED ORDER — ONDANSETRON 8 MG/50ML IVPB (CHCC)
8.0000 mg | Freq: Once | INTRAVENOUS | Status: AC
  Administered 2012-09-07: 8 mg via INTRAVENOUS

## 2012-09-07 MED ORDER — DIPHENHYDRAMINE HCL 25 MG PO CAPS
50.0000 mg | ORAL_CAPSULE | Freq: Once | ORAL | Status: AC
  Administered 2012-09-07: 50 mg via ORAL

## 2012-09-07 MED ORDER — ACETAMINOPHEN 325 MG PO TABS
650.0000 mg | ORAL_TABLET | Freq: Once | ORAL | Status: AC
  Administered 2012-09-07: 650 mg via ORAL

## 2012-09-07 MED ORDER — SODIUM CHLORIDE 0.9 % IV SOLN
Freq: Once | INTRAVENOUS | Status: AC
  Administered 2012-09-07: 11:00:00 via INTRAVENOUS

## 2012-09-07 MED ORDER — SODIUM CHLORIDE 0.9 % IJ SOLN
10.0000 mL | INTRAMUSCULAR | Status: DC | PRN
  Administered 2012-09-07: 10 mL
  Filled 2012-09-07: qty 10

## 2012-09-07 MED ORDER — SODIUM CHLORIDE 0.9 % IV SOLN
6.0000 mg/kg | Freq: Once | INTRAVENOUS | Status: AC
  Administered 2012-09-07: 567 mg via INTRAVENOUS
  Filled 2012-09-07: qty 27

## 2012-09-07 NOTE — Patient Instructions (Signed)
Proceed with Herceptin.  We will give you IV fluids and Zofran for your nausea.  Please call us if you have any questions or concerns.

## 2012-09-07 NOTE — Patient Instructions (Addendum)
[facility] Discharge Instructions for Patients Receiving Chemotherapy  Today you received the following chemotherapy agents: Herceptin  To help prevent nausea and vomiting after your treatment, we encourage you to take your nausea medication as prescribed..   If you develop nausea and vomiting that is not controlled by your nausea medication, call the clinic. If it is after clinic hours, call your family physician or the after hours number for the clinic or go to the Emergency Department.   BELOW ARE SYMPTOMS THAT SHOULD BE REPORTED IMMEDIATELY:  *FEVER GREATER THAN 100.5 F  *CHILLS WITH OR WITHOUT FEVER  NAUSEA AND VOMITING THAT IS NOT CONTROLLED WITH YOUR NAUSEA MEDICATION  *UNUSUAL SHORTNESS OF BREATH  *UNUSUAL BRUISING OR BLEEDING  TENDERNESS IN MOUTH AND THROAT WITH OR WITHOUT PRESENCE OF ULCERS  *URINARY PROBLEMS  *BOWEL PROBLEMS  UNUSUAL RASH Items with * indicate a potential emergency and should be followed up as soon as possible.  The clinic phone number is [phone].

## 2012-09-07 NOTE — Progress Notes (Signed)
OFFICE PROGRESS NOTE  CC  [name], MD [address] [place] [address] Dr. Claud Kelp  DIAGNOSIS: 67 year old female with new diagnosis of stage I ER-/PR-/Her2Neu positive invasive ductal breast cancer  PRIOR THERAPY: 1. 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 ER negative PR negative Her2 Neu positive with Ki -67 53%,   2. S/P porta cath placement for chemotherapy  #3 patient has begun her adjuvant chemotherapy consisting of Taxotere carboplatinum and Herceptin. Her treatment began in May 2013. A total of 4 cycles of Taxotere carboplatinum and Herceptin combination are planned. Once she completes this she will then proceed to radiation therapy with concomitant Herceptin to be given every 3 weeks to finish out a year of treatment.  #4 patient received 4 cycles of Taxotere carboplatinum and Herceptin between 10/30/2011 to 11/2011.  #5 she then began Herceptin every 3 weeks starting on 01/20/2012. She will continue this still May 2014  CURRENT THERAPY: Here for Herceptin every 3 weeks  INTERVAL HISTORY: [name] 67 y.o. female returns for her next dose of Herceptin. She is not feeling very well today.  She is having nausea and diarrhea today.  This started last night.  She denies fevers. She denies chest pain, palpitations, swelling, or any further concerns.        MEDICAL HISTORY: Past Medical History  Diagnosis Date  . Obesity   . PUD (peptic ulcer disease)   . Leukopenia   . Abdominal pain   . Short bowel syndrome   . Internal hemorrhoids without mention of complication   . Stricture and stenosis of esophagus   . Osteoarthrosis, unspecified whether generalized or localized, unspecified site   . Thyrotoxicosis without mention of goiter or other cause, without mention of thyrotoxic crisis or storm   . Other and unspecified hyperlipidemia   . Gout, unspecified    . Diverticulosis of colon (without mention of hemorrhage)   . C. difficile colitis   . VITAMIN B12 DEFICIENCY 08/30/2009  . GOITER, MULTINODULAR 04/02/2009  . HYPOTHYROIDISM, POST-RADIATION 08/13/2009  . ASYMPTOMATIC POSTMENOPAUSAL STATUS 10/11/2008  . Esophageal reflux 06/12/2008  . PONV (postoperative nausea and vomiting)   . Varicose veins   . Type II or unspecified type diabetes mellitus without mention of complication, not stated as uncontrolled     no med in years diet controled  . Blood transfusion   . UTI (urinary tract infection)   . Kidney stones     "several"  . Unspecified essential hypertension   . Angina   . Shortness of breath on exertion     "sometimes"  . Anemia   . ANEMIA, IRON DEFICIENCY 05/08/2009  . History of lower GI bleeding   . H/O hiatal hernia   . Migraines   . Breast cancer 09/29/11    invasive grade III ductal ca,assoc high grade dcis,ER/PR=neg  . History of radiation therapy 02/08/12-03/25/12    left breast,total 61gy    ALLERGIES:  is allergic to aspirin; iodine; and morphine and related.  MEDICATIONS:  Current Outpatient Prescriptions  Medication Sig Dispense Refill  . Calcium Carbonate-Vitamin D (CALCIUM-VITAMIN D) 600-200 MG-UNIT CAPS Take 1 capsule by mouth daily.        . calcium gluconate 650 MG tablet Take 1 tablet (650 mg total) by mouth daily.  30 tablet  2  . cyanocobalamin (,VITAMIN B-12,) 1000 MCG/ML injection Inject 1,000 mcg into  the muscle every 30 (thirty) days. Next one due the 26th of this month      . cyclobenzaprine (FLEXERIL) 5 MG tablet Take 5 mg by mouth Daily.      . fluorometholone (FML) 0.1 % ophthalmic suspension Apply 1 drop to eye Twice daily.      Marland Kitchen gabapentin (NEURONTIN) 100 MG capsule Take 1 capsule (100 mg total) by mouth 2 times daily at 12 noon and 4 pm.  60 capsule  3  . levothyroxine (SYNTHROID, LEVOTHROID) 125 MCG tablet 1 TAB ONCE DAILY  30 tablet  4  . lidocaine-prilocaine (EMLA) cream Apply topically as needed.   30 g  0  . LORazepam (ATIVAN) 0.5 MG tablet Take 1 tablet (0.5 mg total) by mouth every 6 (six) hours as needed (Nausea or vomiting).  60 tablet  0  . lovastatin (MEVACOR) 20 MG tablet TAKE 1 TABLET EVERY DAY  30 tablet  4  . magnesium chloride (SLOW-MAG) 64 MG TBEC Take 1 tablet by mouth daily.       Marland Kitchen omeprazole (PRILOSEC) 40 MG capsule TAKE 1 CAPSULE (40 MG TOTAL) BY MOUTH DAILY.  30 capsule  6  . oxyCODONE-acetaminophen (PERCOCET/ROXICET) 5-325 MG per tablet Take 1 tablet by mouth every 4 (four) hours as needed for pain.  30 tablet  0  . spironolactone (ALDACTONE) 25 MG tablet TAKE 1 TABLET (25 MG TOTAL) BY MOUTH DAILY.  30 tablet  6  . thiamine 100 MG tablet Take 100 mg by mouth daily.        . trazodone (DESYREL) 300 MG tablet TAKE 1 TABLET (300 MG TOTAL) BY MOUTH AT BEDTIME.  30 tablet  5   Current Facility-Administered Medications  Medication Dose Route Frequency Provider Last Rate Last Dose  . cyanocobalamin ((VITAMIN B-12)) injection 1,000 mcg  1,000 mcg Intramuscular Once [name], MD      . ondansetron (ZOFRAN) IVPB 8 mg  8 mg Intravenous Once Augustin Schooling, NP       Facility-Administered Medications Ordered in Other Visits  Medication Dose Route Frequency Provider Last Rate Last Dose  . 0.9 %  sodium chloride infusion   Intravenous Once Victorino December, MD        SURGICAL HISTORY:  Past Surgical History  Procedure Laterality Date  . Vein ligation and stripping  1980's    Right leg  . Lithotripsy      "4 or 5 times"  . Bunionectomy  1970's    bilateral  . Colonoscopy    . Esophagogastroduodenoscopy  07/15/2005  . Thyroid ultrasound  12/1994 and 12/1995  . Mastectomy w/ nodes partial  09/29/11    left  . Port a cath placement  09/29/11    right chest  . Breast surgery    . Cholecystectomy  1990's  . Abdominal hysterectomy  1970's    with BSO  . Dilation and curettage of uterus    . Colon surgery      "several surgeries for short bowel syndrome"  . Abdominal  adhesion surgery  1980's thru 1990's    "several"  . Kidney stone surgery  1990's    "tried to go up & get it but pushed it further up"  . Portacath placement  09/29/2011    Procedure: INSERTION PORT-A-CATH;  Surgeon: Ernestene Mention, MD;  Location: [facility] OR;  Service: General;  Laterality: N/A;  . Breast biopsy  08/13/11    left breast lower inner quadrant  . Breast lumpectomy w/ needle  localization  09/29/11    left  breast=lymph node,excision benign/ ER/PR=neg, her 2 Positive    REVIEW OF SYSTEMS:   General: fatigue (-), night sweats (-), fever (-), pain (+) Lymph: palpable nodes (-) HEENT: vision changes (-), mucositis (-), gum bleeding (-), epistaxis (-) Cardiovascular: chest pain (-), palpitations (-) Pulmonary: shortness of breath (-), dyspnea on exertion (-), cough (-), hemoptysis (-) GI:  Early satiety (-), melena (-), dysphagia (-), nausea/vomiting (-), diarrhea (+) GU: dysuria (-), hematuria (-), incontinence (-) Musculoskeletal: joint swelling (-), joint pain (-), back pain (-) Neuro: weakness (-), numbness (-), headache (-), confusion (-) Skin: Rash (-), lesions (-), dryness (-) Psych: depression (-), suicidal/homicidal ideation (-), feeling of hopelessness (-)   PHYSICAL EXAMINATION:  BP 128/82  Pulse 80  Temp(Src) 98 F (36.7 C) (Oral)  Resp 20  Ht 5\' 5"  (1.651 m)  Wt 205 lb 9.6 oz (93.26 kg)  BMI 34.21 kg/m2 General: Patient is a well appearing female in no acute distress HEENT: PERRLA, sclerae anicteric no conjunctival pallor, MMM Neck: supple, no palpable adenopathy Lungs: clear to auscultation bilaterally, no wheezes, rhonchi, or rales Cardiovascular: regular rate rhythm, S1, S2, no murmurs, rubs or gallops Abdomen: Soft, non-tender, non-distended, normoactive bowel sounds, no HSM Extremities: warm and well perfused, no clubbing, cyanosis, or edema Skin: No rashes or lesions Neuro: Non-focal Bilateral Breast Exam: left breast healing incision scar, with some  tenderness, and radiation skin changes, left breast swollen erythema has resolved. Right breast no masses or nipple discharge ECOG PERFORMANCE STATUS: 1 - Symptomatic but completely ambulatory  LABORATORY DATA: Lab Results  Component Value Date   WBC 3.9 09/07/2012   HGB 10.1* 09/07/2012   HCT 30.3* 09/07/2012   MCV 89.9 09/07/2012   PLT 171 09/07/2012      Chemistry      Component Value Date/Time   NA 140 08/17/2012 0955   NA 141 01/20/2012 1139   K 3.7 08/17/2012 0955   K 3.0* 01/20/2012 1139   CL 106 08/17/2012 0955   CL 103 01/20/2012 1139   CO2 24 08/17/2012 0955   CO2 23 01/20/2012 1139   BUN 16.7 08/17/2012 0955   BUN 14 01/20/2012 1139   CREATININE 1.1 08/17/2012 0955   CREATININE 0.86 01/20/2012 1139   CREATININE 0.88 03/31/2011 1550      Component Value Date/Time   CALCIUM 9.7 08/17/2012 0955   CALCIUM 6.7* 01/20/2012 1139   CALCIUM 9.5 05/15/2010 2153   ALKPHOS 79 08/17/2012 0955   ALKPHOS 105 01/20/2012 1139   AST 26 08/17/2012 0955   AST 24 01/20/2012 1139   ALT 19 08/17/2012 0955   ALT 13 01/20/2012 1139   BILITOT 0.62 08/17/2012 0955   BILITOT 0.8 01/20/2012 1139     ADDITIONAL INFORMATION: 1. PROGNOSTIC INDICATORS - ACIS Results IMMUNOHISTOCHEMICAL AND MORPHOMETRIC ANALYSIS BY THE AUTOMATED CELLULAR IMAGING SYSTEM (ACIS) Estrogen Receptor (Negative, <1%): 0%, NEGATIVE Progesterone Receptor (Negative, <1%): 0%, NEGATIVE COMMENT: The negative hormone receptor study(ies) in this case have an internal positive control. All controls stained appropriately Abigail Miyamoto MD Pathologist, Electronic Signature ( Signed 10/07/2011) FINAL DIAGNOSIS 1 of 4 FINAL for Eller, Ardine O ([id number]) Diagnosis 1. Breast, lumpectomy, Left - INVASIVE GRADE III, DUCTAL CARCINOMA, SPANNING 0.7 CM. - ASSOCIATED HIGH GRADED DUCTAL CARCINOMA IN SITU. - LYMPH/VASCULAR INVASION NOT IDENTIFIED. - MARGINS ARE NEGATIVE. - SEE ONCOLOGY TEMPLATE. 2. Lymph node, sentinel, biopsy, Left  axillary#1 - ONE BENIGN LYMPH NODE WITH NO TUMOR (0/1). - BENIGN GLANDULAR EPITHELIAL  INCLUSIONS PRESENT. - SEE COMMENT. 3. Breast, excision, Posterior margin - BENIGN BREAST PARENCHYMA. - NO ATYPIA, HYPERPLASIA, OR MALIGNANCY IDENTIFIED. 4. Lymph node, sentinel, biopsy, Left axillary #2 - ONE BENIGN LYMPH NODE WITH NO TUMOR SEEN (0/1). Microscopic Comment 1. BREAST, INVASIVE TUMOR, WITH LYMPH NODE SAMPLING Specimen, including laterality: Left breast with posterior margin and sentinel lymph nodes. Procedure: Left breast lumpectomy with posterior margin excision and sentinel lymph node biopsies. Grade: III. Tubule formation: 3. Nuclear pleomorphism: 3. Mitotic:3. Tumor size (gross measurement and glass slide measurement): 0.7 cm. Margins: Invasive, distance to closest margin: At least 0.8 cm. In-situ, distance to closest margin: At least 0.8 cm. Lymphovascular invasion: Not identified. Ductal carcinoma in situ: Yes. Grade: High grade. Extensive intraductal component: No. Lobular neoplasia: No. Tumor focality: Unifocal. Treatment effect: N/A. Extent of tumor: Confined to breast parenchyma. Lymph nodes: # examined: 2. Lymph nodes with metastasis: 0. Breast prognostic profile: Performed on previous case ([id number]) Estrogen receptor: 0%, negative. Progesterone receptor: 0%, negative. Her 2 neu: 3.09, amplified. Ki-67: 53%. Non-neoplastic breast: Fat necrosis present. TNM: pT1b, pN0, MX. Comments: An estrogen receptor and progesterone receptor will be repeated on the current tumor and reported in an addendum. (RAH:gt, 10/01/11) 2. The left sentinel axillary lymph node #1 shows benign glandular epithelial inclusions. Some of the inclusions are ciliated. The nuclei are bland appearing and are not malignant. Smooth muscle myosin, p63 and calponin immunohistochemical stains are performed which do not show a myoepithelial layer. 2 of 4 FINAL for Lemarr, GAYE SCORZA  ([id number]) Microscopic Comment(continued) Although this is the case, the inclusions are benign and do not represent metastatic carcinoma. Both Dr. Frederica Kuster and Dr. Colonel Bald have seen the left sentinel axillary lymph node in consultation with agreement that the inclusions are benign and do not represent metastatic carcinoma. Zandra Abts MD Pathologist, Electronic Signature (Case signed 10/02/2011) Specimen Gross and Clinical Information Specimen(s) Obtained: 1. Breast, lumpectomy, Left 2. Lymph node, sentinel, biopsy, Left axillary#1 3. Breast, excision, Posterior margin 4. Lymph node, sentinel, biopsy, Left axillary #2 Specimen Clinical  RADIOGRAPHIC STUDIES:  ASSESSMENT: 67 year old with   1. 0.7 cm high grade invasive ductal carcinoma that is ER-, PR- Her2Neu +, sentinel node negative (T1bN0) pathologic stage I.Because patient is HER-2 positive she is receiving adjuvant chemotherapy and Herceptin. She is going to be receiving 4 cycles of TCH combination and then Herceptin alone to complete out 1years worth of treatment.  2.Receiving radiation therapy  3. Left knee pain and left leg swelling patient at this time does not want to have her knee replacement surgery she wants to wait until she completes all of her Herceptin.  4. Anemia due to chemotherapy  5. Pain  6. Left breast swelling patient was seen by Dr. Claud Kelp and she has been reassured that this is nothing dangerous.   PLAN:   #1 Ms. Logsdon will receive her next dose of Herceptin today. I will also give her IV fluids and Zofran for the nausea/diarrhea.  #2 The erythema and swelling in Ms. Fraiser's left breast has resolved.   #3 she will return in 3 weeks' time for next dose of Herceptin. Her anticipated Herceptin completion is May 2014.  All questions were answered. The patient knows to call the clinic with any problems, questions or concerns. We can certainly see the patient much sooner if necessary.  I spent  25 minutes counseling the patient face to face. The total time spent in the appointment was 30 minutes.  Cherie Ouch Lyn Hollingshead,  NP Medical Oncology [facility] Phone: [phone]

## 2012-09-09 ENCOUNTER — Other Ambulatory Visit: Payer: Self-pay | Admitting: Adult Health

## 2012-09-09 DIAGNOSIS — Z79899 Other long term (current) drug therapy: Secondary | ICD-10-CM

## 2012-09-13 ENCOUNTER — Telehealth: Payer: Self-pay | Admitting: *Deleted

## 2012-09-13 NOTE — Telephone Encounter (Signed)
sw pt gv appt for echo/ Dr. Gala Romney scheduled for 10/05/12@ 10am..the patient is aware.Rachel Kitchentd

## 2012-09-19 ENCOUNTER — Other Ambulatory Visit: Payer: Self-pay | Admitting: Adult Health

## 2012-09-19 DIAGNOSIS — N63 Unspecified lump in unspecified breast: Secondary | ICD-10-CM

## 2012-09-19 DIAGNOSIS — Z853 Personal history of malignant neoplasm of breast: Secondary | ICD-10-CM

## 2012-09-19 DIAGNOSIS — Z9889 Other specified postprocedural states: Secondary | ICD-10-CM

## 2012-09-27 ENCOUNTER — Telehealth: Payer: Self-pay | Admitting: *Deleted

## 2012-09-27 NOTE — Telephone Encounter (Signed)
I called patient and left a message with a reminder about scheduled appointments 09/28/12.  Contact information left on voicemail.

## 2012-09-28 ENCOUNTER — Other Ambulatory Visit (HOSPITAL_BASED_OUTPATIENT_CLINIC_OR_DEPARTMENT_OTHER): Payer: Medicare Other | Admitting: Lab

## 2012-09-28 ENCOUNTER — Ambulatory Visit (HOSPITAL_BASED_OUTPATIENT_CLINIC_OR_DEPARTMENT_OTHER): Payer: Medicare Other

## 2012-09-28 ENCOUNTER — Encounter: Payer: Self-pay | Admitting: Adult Health

## 2012-09-28 ENCOUNTER — Ambulatory Visit (HOSPITAL_BASED_OUTPATIENT_CLINIC_OR_DEPARTMENT_OTHER): Payer: Medicare Other | Admitting: Adult Health

## 2012-09-28 ENCOUNTER — Encounter: Payer: Self-pay | Admitting: Oncology

## 2012-09-28 VITALS — BP 137/87 | HR 76 | Temp 97.6°F | Resp 20 | Ht 65.0 in | Wt 207.2 lb

## 2012-09-28 DIAGNOSIS — C50319 Malignant neoplasm of lower-inner quadrant of unspecified female breast: Secondary | ICD-10-CM

## 2012-09-28 DIAGNOSIS — C50219 Malignant neoplasm of upper-inner quadrant of unspecified female breast: Secondary | ICD-10-CM

## 2012-09-28 DIAGNOSIS — C50919 Malignant neoplasm of unspecified site of unspecified female breast: Secondary | ICD-10-CM

## 2012-09-28 DIAGNOSIS — C50312 Malignant neoplasm of lower-inner quadrant of left female breast: Secondary | ICD-10-CM

## 2012-09-28 DIAGNOSIS — D6481 Anemia due to antineoplastic chemotherapy: Secondary | ICD-10-CM

## 2012-09-28 DIAGNOSIS — Z171 Estrogen receptor negative status [ER-]: Secondary | ICD-10-CM

## 2012-09-28 DIAGNOSIS — Z5112 Encounter for antineoplastic immunotherapy: Secondary | ICD-10-CM

## 2012-09-28 DIAGNOSIS — M25519 Pain in unspecified shoulder: Secondary | ICD-10-CM

## 2012-09-28 LAB — COMPREHENSIVE METABOLIC PANEL (CC13)
ALT: 17 U/L (ref 0–55)
BUN: 14.5 mg/dL (ref 7.0–26.0)
CO2: 25 mEq/L (ref 22–29)
Calcium: 9.5 mg/dL (ref 8.4–10.4)
Chloride: 105 mEq/L (ref 98–107)
Creatinine: 1 mg/dL (ref 0.6–1.1)
Glucose: 77 mg/dl (ref 70–99)
Total Bilirubin: 0.85 mg/dL (ref 0.20–1.20)

## 2012-09-28 LAB — CBC WITH DIFFERENTIAL/PLATELET
BASO%: 0.2 % (ref 0.0–2.0)
Basophils Absolute: 0 10*3/uL (ref 0.0–0.1)
EOS%: 0.7 % (ref 0.0–7.0)
HGB: 9.9 g/dL — ABNORMAL LOW (ref 11.6–15.9)
MCH: 29.6 pg (ref 25.1–34.0)
MCHC: 32.5 g/dL (ref 31.5–36.0)
RBC: 3.34 10*6/uL — ABNORMAL LOW (ref 3.70–5.45)
RDW: 13.1 % (ref 11.2–14.5)
lymph#: 1.1 10*3/uL (ref 0.9–3.3)

## 2012-09-28 MED ORDER — SODIUM CHLORIDE 0.9 % IV SOLN
Freq: Once | INTRAVENOUS | Status: AC
  Administered 2012-09-28: 12:00:00 via INTRAVENOUS

## 2012-09-28 MED ORDER — DIPHENHYDRAMINE HCL 25 MG PO CAPS
50.0000 mg | ORAL_CAPSULE | Freq: Once | ORAL | Status: AC
  Administered 2012-09-28: 50 mg via ORAL

## 2012-09-28 MED ORDER — ACETAMINOPHEN 325 MG PO TABS
650.0000 mg | ORAL_TABLET | Freq: Once | ORAL | Status: AC
  Administered 2012-09-28: 650 mg via ORAL

## 2012-09-28 MED ORDER — HEPARIN SOD (PORK) LOCK FLUSH 100 UNIT/ML IV SOLN
500.0000 [IU] | Freq: Once | INTRAVENOUS | Status: AC | PRN
  Administered 2012-09-28: 500 [IU]
  Filled 2012-09-28: qty 5

## 2012-09-28 MED ORDER — SODIUM CHLORIDE 0.9 % IJ SOLN
10.0000 mL | INTRAMUSCULAR | Status: DC | PRN
  Administered 2012-09-28: 10 mL
  Filled 2012-09-28: qty 10

## 2012-09-28 MED ORDER — TRASTUZUMAB CHEMO INJECTION 440 MG
6.0000 mg/kg | Freq: Once | INTRAVENOUS | Status: AC
  Administered 2012-09-28: 567 mg via INTRAVENOUS
  Filled 2012-09-28: qty 27

## 2012-09-28 NOTE — Progress Notes (Signed)
OFFICE PROGRESS NOTE  CC  [name], MD [address] [place] [address] Dr. Claud Kelp  DIAGNOSIS: 67 year old female with new diagnosis of stage I ER-/PR-/Her2Neu positive invasive ductal breast cancer  PRIOR THERAPY: 1. 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 ER negative PR negative Her2 Neu positive with Ki -67 53%,   2. S/P porta cath placement for chemotherapy  #3 patient has begun her adjuvant chemotherapy consisting of Taxotere carboplatinum and Herceptin. Her treatment began in May 2013. A total of 4 cycles of Taxotere carboplatinum and Herceptin combination are planned. Once she completes this she will then proceed to radiation therapy with concomitant Herceptin to be given every 3 weeks to finish out a year of treatment.  #4 patient received 4 cycles of Taxotere carboplatinum and Herceptin between 10/30/2011 to 11/2011.  #5 she then began Herceptin every 3 weeks starting on 01/20/2012. She will continue this still May 2014  CURRENT THERAPY: Here for Herceptin every 3 weeks  INTERVAL HISTORY: [name] 67 y.o. female returns for her next dose of Herceptin. She is doing well today.  She has mild tendonitis in her right wrist, but otherwise, a 10 point ROS is neg.  Her mammo is scheduled on 5/2 and Dr. Gala Romney appt on 5/7.    MEDICAL HISTORY: Past Medical History  Diagnosis Date  . Obesity   . PUD (peptic ulcer disease)   . Leukopenia   . Abdominal pain   . Short bowel syndrome   . Internal hemorrhoids without mention of complication   . Stricture and stenosis of esophagus   . Osteoarthrosis, unspecified whether generalized or localized, unspecified site   . Thyrotoxicosis without mention of goiter or other cause, without mention of thyrotoxic crisis or storm   . Other and unspecified hyperlipidemia   . Gout, unspecified   . Diverticulosis of colon  (without mention of hemorrhage)   . C. difficile colitis   . VITAMIN B12 DEFICIENCY 08/30/2009  . GOITER, MULTINODULAR 04/02/2009  . HYPOTHYROIDISM, POST-RADIATION 08/13/2009  . ASYMPTOMATIC POSTMENOPAUSAL STATUS 10/11/2008  . Esophageal reflux 06/12/2008  . PONV (postoperative nausea and vomiting)   . Varicose veins   . Type II or unspecified type diabetes mellitus without mention of complication, not stated as uncontrolled     no med in years diet controled  . Blood transfusion   . UTI (urinary tract infection)   . Kidney stones     "several"  . Unspecified essential hypertension   . Angina   . Shortness of breath on exertion     "sometimes"  . Anemia   . ANEMIA, IRON DEFICIENCY 05/08/2009  . History of lower GI bleeding   . H/O hiatal hernia   . Migraines   . Breast cancer 09/29/11    invasive grade III ductal ca,assoc high grade dcis,ER/PR=neg  . History of radiation therapy 02/08/12-03/25/12    left breast,total 61gy    ALLERGIES:  is allergic to aspirin; iodine; and morphine and related.  MEDICATIONS:  Current Outpatient Prescriptions  Medication Sig Dispense Refill  . Calcium Carbonate-Vitamin D (CALCIUM-VITAMIN D) 600-200 MG-UNIT CAPS Take 1 capsule by mouth daily.        . calcium gluconate 650 MG tablet Take 1 tablet (650 mg total) by mouth daily.  30 tablet  2  . cyanocobalamin (,VITAMIN B-12,) 1000 MCG/ML injection Inject 1,000 mcg into the muscle every  30 (thirty) days. Next one due the 26th of this month      . cyclobenzaprine (FLEXERIL) 5 MG tablet Take 5 mg by mouth Daily.      . fluorometholone (FML) 0.1 % ophthalmic suspension Apply 1 drop to eye Twice daily.      Marland Kitchen gabapentin (NEURONTIN) 100 MG capsule Take 1 capsule (100 mg total) by mouth 2 times daily at 12 noon and 4 pm.  60 capsule  3  . levothyroxine (SYNTHROID, LEVOTHROID) 125 MCG tablet 1 TAB ONCE DAILY  30 tablet  4  . lidocaine-prilocaine (EMLA) cream Apply topically as needed.  30 g  0  . LORazepam  (ATIVAN) 0.5 MG tablet Take 1 tablet (0.5 mg total) by mouth every 6 (six) hours as needed (Nausea or vomiting).  60 tablet  0  . lovastatin (MEVACOR) 20 MG tablet TAKE 1 TABLET EVERY DAY  30 tablet  4  . magnesium chloride (SLOW-MAG) 64 MG TBEC Take 1 tablet by mouth daily.       Marland Kitchen omeprazole (PRILOSEC) 40 MG capsule TAKE 1 CAPSULE (40 MG TOTAL) BY MOUTH DAILY.  30 capsule  6  . oxyCODONE-acetaminophen (PERCOCET/ROXICET) 5-325 MG per tablet Take 1 tablet by mouth every 4 (four) hours as needed for pain.  30 tablet  0  . spironolactone (ALDACTONE) 25 MG tablet TAKE 1 TABLET (25 MG TOTAL) BY MOUTH DAILY.  30 tablet  6  . thiamine 100 MG tablet Take 100 mg by mouth daily.        . trazodone (DESYREL) 300 MG tablet TAKE 1 TABLET (300 MG TOTAL) BY MOUTH AT BEDTIME.  30 tablet  5   Current Facility-Administered Medications  Medication Dose Route Frequency Provider Last Rate Last Dose  . cyanocobalamin ((VITAMIN B-12)) injection 1,000 mcg  1,000 mcg Intramuscular Once [name], MD        SURGICAL HISTORY:  Past Surgical History  Procedure Laterality Date  . Vein ligation and stripping  1980's    Right leg  . Lithotripsy      "4 or 5 times"  . Bunionectomy  1970's    bilateral  . Colonoscopy    . Esophagogastroduodenoscopy  07/15/2005  . Thyroid ultrasound  12/1994 and 12/1995  . Mastectomy w/ nodes partial  09/29/11    left  . Port a cath placement  09/29/11    right chest  . Breast surgery    . Cholecystectomy  1990's  . Abdominal hysterectomy  1970's    with BSO  . Dilation and curettage of uterus    . Colon surgery      "several surgeries for short bowel syndrome"  . Abdominal adhesion surgery  1980's thru 1990's    "several"  . Kidney stone surgery  1990's    "tried to go up & get it but pushed it further up"  . Portacath placement  09/29/2011    Procedure: INSERTION PORT-A-CATH;  Surgeon: Ernestene Mention, MD;  Location: [facility] OR;  Service: General;  Laterality: N/A;  . Breast  biopsy  08/13/11    left breast lower inner quadrant  . Breast lumpectomy w/ needle localization  09/29/11    left  breast=lymph node,excision benign/ ER/PR=neg, her 2 Positive    REVIEW OF SYSTEMS:   General: fatigue (-), night sweats (-), fever (-), pain (+) Lymph: palpable nodes (-) HEENT: vision changes (-), mucositis (-), gum bleeding (-), epistaxis (-) Cardiovascular: chest pain (-), palpitations (-) Pulmonary: shortness of breath (-), dyspnea  on exertion (-), cough (-), hemoptysis (-) GI:  Early satiety (-), melena (-), dysphagia (-), nausea/vomiting (-), diarrhea (-) GU: dysuria (-), hematuria (-), incontinence (-) Musculoskeletal: joint swelling (-), joint pain (-), back pain (-) Neuro: weakness (-), numbness (-), headache (-), confusion (-) Skin: Rash (-), lesions (-), dryness (-) Psych: depression (-), suicidal/homicidal ideation (-), feeling of hopelessness (-)   PHYSICAL EXAMINATION:  BP 137/87  Pulse 76  Temp(Src) 97.6 F (36.4 C) (Oral)  Resp 20  Ht 5\' 5"  (1.651 m)  Wt 207 lb 3.2 oz (93.985 kg)  BMI 34.48 kg/m2 General: Patient is a well appearing female in no acute distress HEENT: PERRLA, sclerae anicteric no conjunctival pallor, MMM Neck: supple, no palpable adenopathy Lungs: clear to auscultation bilaterally, no wheezes, rhonchi, or rales Cardiovascular: regular rate rhythm, S1, S2, no murmurs, rubs or gallops Abdomen: Soft, non-tender, non-distended, normoactive bowel sounds, no HSM Extremities: warm and well perfused, no clubbing, cyanosis, or edema Skin: No rashes or lesions Neuro: Non-focal Bilateral Breast Exam: left breast healing incision scar, with some tenderness, and radiation skin changes, left breast swollen erythema has resolved. Right breast no masses or nipple discharge ECOG PERFORMANCE STATUS: 1 - Symptomatic but completely ambulatory  LABORATORY DATA: Lab Results  Component Value Date   WBC 4.1 09/28/2012   HGB 9.9* 09/28/2012   HCT 30.5*  09/28/2012   MCV 91.3 09/28/2012   PLT 190 09/28/2012      Chemistry      Component Value Date/Time   NA 139 09/28/2012 1013   NA 141 01/20/2012 1139   K 4.2 09/28/2012 1013   K 3.0* 01/20/2012 1139   CL 105 09/28/2012 1013   CL 103 01/20/2012 1139   CO2 25 09/28/2012 1013   CO2 23 01/20/2012 1139   BUN 14.5 09/28/2012 1013   BUN 14 01/20/2012 1139   CREATININE 1.0 09/28/2012 1013   CREATININE 0.86 01/20/2012 1139   CREATININE 0.88 03/31/2011 1550      Component Value Date/Time   CALCIUM 9.5 09/28/2012 1013   CALCIUM 6.7* 01/20/2012 1139   CALCIUM 9.5 05/15/2010 2153   ALKPHOS 81 09/28/2012 1013   ALKPHOS 105 01/20/2012 1139   AST 24 09/28/2012 1013   AST 24 01/20/2012 1139   ALT 17 09/28/2012 1013   ALT 13 01/20/2012 1139   BILITOT 0.85 09/28/2012 1013   BILITOT 0.8 01/20/2012 1139     ADDITIONAL INFORMATION: 1. PROGNOSTIC INDICATORS - ACIS Results IMMUNOHISTOCHEMICAL AND MORPHOMETRIC ANALYSIS BY THE AUTOMATED CELLULAR IMAGING SYSTEM (ACIS) Estrogen Receptor (Negative, <1%): 0%, NEGATIVE Progesterone Receptor (Negative, <1%): 0%, NEGATIVE COMMENT: The negative hormone receptor study(ies) in this case have an internal positive control. All controls stained appropriately Abigail Miyamoto MD Pathologist, Electronic Signature ( Signed 10/07/2011) FINAL DIAGNOSIS 1 of 4 FINAL for Koppen, Iyari O ([id number]) Diagnosis 1. Breast, lumpectomy, Left - INVASIVE GRADE III, DUCTAL CARCINOMA, SPANNING 0.7 CM. - ASSOCIATED HIGH GRADED DUCTAL CARCINOMA IN SITU. - LYMPH/VASCULAR INVASION NOT IDENTIFIED. - MARGINS ARE NEGATIVE. - SEE ONCOLOGY TEMPLATE. 2. Lymph node, sentinel, biopsy, Left axillary#1 - ONE BENIGN LYMPH NODE WITH NO TUMOR (0/1). - BENIGN GLANDULAR EPITHELIAL INCLUSIONS PRESENT. - SEE COMMENT. 3. Breast, excision, Posterior margin - BENIGN BREAST PARENCHYMA. - NO ATYPIA, HYPERPLASIA, OR MALIGNANCY IDENTIFIED. 4. Lymph node, sentinel, biopsy, Left axillary #2 - ONE BENIGN  LYMPH NODE WITH NO TUMOR SEEN (0/1). Microscopic Comment 1. BREAST, INVASIVE TUMOR, WITH LYMPH NODE SAMPLING Specimen, including laterality: Left breast with posterior margin and sentinel lymph  nodes. Procedure: Left breast lumpectomy with posterior margin excision and sentinel lymph node biopsies. Grade: III. Tubule formation: 3. Nuclear pleomorphism: 3. Mitotic:3. Tumor size (gross measurement and glass slide measurement): 0.7 cm. Margins: Invasive, distance to closest margin: At least 0.8 cm. In-situ, distance to closest margin: At least 0.8 cm. Lymphovascular invasion: Not identified. Ductal carcinoma in situ: Yes. Grade: High grade. Extensive intraductal component: No. Lobular neoplasia: No. Tumor focality: Unifocal. Treatment effect: N/A. Extent of tumor: Confined to breast parenchyma. Lymph nodes: # examined: 2. Lymph nodes with metastasis: 0. Breast prognostic profile: Performed on previous case ([id number]) Estrogen receptor: 0%, negative. Progesterone receptor: 0%, negative. Her 2 neu: 3.09, amplified. Ki-67: 53%. Non-neoplastic breast: Fat necrosis present. TNM: pT1b, pN0, MX. Comments: An estrogen receptor and progesterone receptor will be repeated on the current tumor and reported in an addendum. (RAH:gt, 10/01/11) 2. The left sentinel axillary lymph node #1 shows benign glandular epithelial inclusions. Some of the inclusions are ciliated. The nuclei are bland appearing and are not malignant. Smooth muscle myosin, p63 and calponin immunohistochemical stains are performed which do not show a myoepithelial layer. 2 of 4 FINAL for Prunty, DERICA LEIBER ([id number]) Microscopic Comment(continued) Although this is the case, the inclusions are benign and do not represent metastatic carcinoma. Both Dr. Frederica Kuster and Dr. Colonel Bald have seen the left sentinel axillary lymph node in consultation with agreement that the inclusions are benign and do not represent metastatic  carcinoma. Zandra Abts MD Pathologist, Electronic Signature (Case signed 10/02/2011) Specimen Gross and Clinical Information Specimen(s) Obtained: 1. Breast, lumpectomy, Left 2. Lymph node, sentinel, biopsy, Left axillary#1 3. Breast, excision, Posterior margin 4. Lymph node, sentinel, biopsy, Left axillary #2 Specimen Clinical  RADIOGRAPHIC STUDIES:  ASSESSMENT: 67 year old with   1. 0.7 cm high grade invasive ductal carcinoma that is ER-, PR- Her2Neu +, sentinel node negative (T1bN0) pathologic stage I.Because patient is HER-2 positive she is receiving adjuvant chemotherapy and Herceptin. She is going to be receiving 4 cycles of TCH combination and then Herceptin alone to complete out 1years worth of treatment.  2.Receiving radiation therapy  3. Left knee pain and left leg swelling patient at this time does not want to have her knee replacement surgery she wants to wait until she completes all of her Herceptin.  4. Anemia due to chemotherapy  5. Pain  6. Left breast swelling patient was seen by Dr. Claud Kelp and she has been reassured that this is nothing dangerous.   PLAN:   #1 Ms. Douglass will receive her next dose of Herceptin today.  #2 The erythema and swelling in Ms. Hsiung's left breast has resolved.   #3 she will return in 3 weeks' time for next dose of Herceptin. Her anticipated Herceptin completion is May 2014.  All questions were answered. The patient knows to call the clinic with any problems, questions or concerns. We can certainly see the patient much sooner if necessary.  I spent 15 minutes counseling the patient face to face. The total time spent in the appointment was 30 minutes.  Cherie Ouch Lyn Hollingshead, NP Medical Oncology [facility] Phone: [phone]

## 2012-09-28 NOTE — Patient Instructions (Addendum)

## 2012-09-28 NOTE — Patient Instructions (Addendum)
Doing well.  Proceed with Herceptin.  Please call us if you have any questions or concerns.    

## 2012-09-29 ENCOUNTER — Encounter: Payer: Self-pay | Admitting: *Deleted

## 2012-09-29 NOTE — Progress Notes (Signed)
I called patient to check in and to review upcoming scheduled appointments.  When seen in the clinic prior to Herceptin infusion on 4/30, she reported that she had been having some nausea and diarrhea.  Today she reports that she continues to have some nausea and some pain in her side.  I instructed her to call Dr. Milta Deiters office if her symptoms do not improve or become more severe.  She does report that she has medication she can take for the nausea.  She denied any questions at this time.  She verified that she has my contact information and was instructed to call for any questions or concerns.

## 2012-09-30 ENCOUNTER — Ambulatory Visit
Admission: RE | Admit: 2012-09-30 | Discharge: 2012-09-30 | Disposition: A | Discharge status: Home / Self Care | Payer: Medicare Other | Source: Ambulatory Visit | Attending: Adult Health | Admitting: Adult Health

## 2012-09-30 DIAGNOSIS — N63 Unspecified lump in unspecified breast: Secondary | ICD-10-CM

## 2012-09-30 DIAGNOSIS — Z853 Personal history of malignant neoplasm of breast: Secondary | ICD-10-CM

## 2012-09-30 DIAGNOSIS — Z9889 Other specified postprocedural states: Secondary | ICD-10-CM

## 2012-10-05 ENCOUNTER — Ambulatory Visit (HOSPITAL_BASED_OUTPATIENT_CLINIC_OR_DEPARTMENT_OTHER)
Admission: RE | Admit: 2012-10-05 | Discharge: 2012-10-05 | Disposition: A | Discharge status: Home / Self Care | Payer: Medicare Other | Source: Ambulatory Visit | Attending: Internal Medicine | Admitting: Internal Medicine

## 2012-10-05 ENCOUNTER — Ambulatory Visit (HOSPITAL_COMMUNITY)
Admission: RE | Admit: 2012-10-05 | Discharge: 2012-10-05 | Disposition: A | Discharge status: Home / Self Care | Payer: Medicare Other | Source: Ambulatory Visit | Attending: Endocrinology | Admitting: Endocrinology

## 2012-10-05 VITALS — BP 116/74 | HR 83 | Wt 204.0 lb

## 2012-10-05 DIAGNOSIS — K5730 Diverticulosis of large intestine without perforation or abscess without bleeding: Secondary | ICD-10-CM | POA: Insufficient documentation

## 2012-10-05 DIAGNOSIS — Z09 Encounter for follow-up examination after completed treatment for conditions other than malignant neoplasm: Secondary | ICD-10-CM

## 2012-10-05 DIAGNOSIS — Z79899 Other long term (current) drug therapy: Secondary | ICD-10-CM | POA: Insufficient documentation

## 2012-10-05 DIAGNOSIS — C50919 Malignant neoplasm of unspecified site of unspecified female breast: Secondary | ICD-10-CM | POA: Insufficient documentation

## 2012-10-05 DIAGNOSIS — C50312 Malignant neoplasm of lower-inner quadrant of left female breast: Secondary | ICD-10-CM

## 2012-10-05 DIAGNOSIS — M109 Gout, unspecified: Secondary | ICD-10-CM | POA: Insufficient documentation

## 2012-10-05 DIAGNOSIS — E049 Nontoxic goiter, unspecified: Secondary | ICD-10-CM | POA: Insufficient documentation

## 2012-10-05 DIAGNOSIS — Z860100 Personal history of colon polyps, unspecified: Secondary | ICD-10-CM | POA: Insufficient documentation

## 2012-10-05 DIAGNOSIS — E785 Hyperlipidemia, unspecified: Secondary | ICD-10-CM | POA: Insufficient documentation

## 2012-10-05 DIAGNOSIS — K219 Gastro-esophageal reflux disease without esophagitis: Secondary | ICD-10-CM | POA: Insufficient documentation

## 2012-10-05 DIAGNOSIS — I8390 Asymptomatic varicose veins of unspecified lower extremity: Secondary | ICD-10-CM | POA: Insufficient documentation

## 2012-10-05 DIAGNOSIS — D509 Iron deficiency anemia, unspecified: Secondary | ICD-10-CM | POA: Insufficient documentation

## 2012-10-05 DIAGNOSIS — Z87442 Personal history of urinary calculi: Secondary | ICD-10-CM | POA: Insufficient documentation

## 2012-10-05 DIAGNOSIS — K222 Esophageal obstruction: Secondary | ICD-10-CM | POA: Insufficient documentation

## 2012-10-05 DIAGNOSIS — E538 Deficiency of other specified B group vitamins: Secondary | ICD-10-CM | POA: Insufficient documentation

## 2012-10-05 DIAGNOSIS — E669 Obesity, unspecified: Secondary | ICD-10-CM | POA: Insufficient documentation

## 2012-10-05 DIAGNOSIS — M1990 Unspecified osteoarthritis, unspecified site: Secondary | ICD-10-CM | POA: Insufficient documentation

## 2012-10-05 DIAGNOSIS — Z171 Estrogen receptor negative status [ER-]: Secondary | ICD-10-CM | POA: Insufficient documentation

## 2012-10-05 DIAGNOSIS — K912 Postsurgical malabsorption, not elsewhere classified: Secondary | ICD-10-CM | POA: Insufficient documentation

## 2012-10-05 DIAGNOSIS — K648 Other hemorrhoids: Secondary | ICD-10-CM | POA: Insufficient documentation

## 2012-10-05 DIAGNOSIS — E890 Postprocedural hypothyroidism: Secondary | ICD-10-CM | POA: Insufficient documentation

## 2012-10-05 DIAGNOSIS — E119 Type 2 diabetes mellitus without complications: Secondary | ICD-10-CM | POA: Insufficient documentation

## 2012-10-05 DIAGNOSIS — C50319 Malignant neoplasm of lower-inner quadrant of unspecified female breast: Secondary | ICD-10-CM

## 2012-10-05 NOTE — Progress Notes (Signed)
Patient ID: [name], female   DOB: 1946-05-12, 67 y.o.   MRN: [id number] PCP: Dr. Everardo All Oncologist: Dr. Welton Flakes  HPI:   [name] is a 67 year old female with h/o obesity, short bowel syndrome, diet-controlled DM2, CP s/p multiple Myoviews Last in 11/12 (EF 61% normal perfusion). Diagnosed May 2013 : stage I ER-/PR-/Her2Neu positive invasive ductal breast cancer.  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 ER negative PR negative Her2 Neu positive with Ki -67 53%, 2,  S/P porta cath placement.   She completed 4 cycles of Taxotere carboplatinum and Herceptin. Her treatment began in May 2013. She then proceeded with radiation therapy with concomitant Herceptin given every 3 weeks to finish out a year of treatment. Completion target  Oct 18, 2012.   Echo: 11/12 EF 50-55% lateral s' 8.4 (second peak) Echo: 4/13 EF 45-50% lateral s' 8.5 (second peak - 1st peak not seen) Echo:12/24/11  EF 50-55% lateral s' 8.7 ECHO: 03/28/12 EF 55% lateral S' unreadable but does not appear depressed. MR mild ECHO 07/07/12 EF 50-55% lateral S' 8.2 ECHO 10/05/12 EF 50-55% lateral S' 8.9  She returns for follow up. She is on target to complete Herceptin Oct 18, 2012. Denies PND/Orthopnea. Mild dyspnea going up steps. Denies lower extremity edema. Compliant with medications.   Past Medical History  Diagnosis Date  . Obesity   . PUD (peptic ulcer disease)   . Leukopenia   . Abdominal pain   . Short bowel syndrome   . Internal hemorrhoids without mention of complication   . Stricture and stenosis of esophagus   . Osteoarthrosis, unspecified whether generalized or localized, unspecified site   . Thyrotoxicosis without mention of goiter or other cause, without mention of thyrotoxic crisis or storm   . Other and unspecified hyperlipidemia   . Gout, unspecified   . Diverticulosis of colon (without mention of hemorrhage)   . C.  difficile colitis   . VITAMIN B12 DEFICIENCY 08/30/2009  . GOITER, MULTINODULAR 04/02/2009  . HYPOTHYROIDISM, POST-RADIATION 08/13/2009  . ASYMPTOMATIC POSTMENOPAUSAL STATUS 10/11/2008  . Esophageal reflux 06/12/2008  . PONV (postoperative nausea and vomiting)   . Varicose veins   . Type II or unspecified type diabetes mellitus without mention of complication, not stated as uncontrolled     no med in years diet controled  . Blood transfusion   . UTI (urinary tract infection)   . Kidney stones     "several"  . Unspecified essential hypertension   . Angina   . Shortness of breath on exertion     "sometimes"  . Anemia   . ANEMIA, IRON DEFICIENCY 05/08/2009  . History of lower GI bleeding   . H/O hiatal hernia   . Migraines   . Breast cancer 09/29/11    invasive grade III ductal ca,assoc high grade dcis,ER/PR=neg  . History of radiation therapy 02/08/12-03/25/12    left breast,total 61gy    Current Outpatient Prescriptions  Medication Sig Dispense Refill  . Calcium Carbonate-Vitamin D (CALCIUM-VITAMIN D) 600-200 MG-UNIT CAPS Take 1 capsule by mouth daily.        . calcium gluconate 650 MG tablet Take 1 tablet (650 mg total) by mouth daily.  30 tablet  2  . cyanocobalamin (,VITAMIN B-12,) 1000 MCG/ML injection Inject 1,000 mcg into the muscle every 30 (thirty) days. Next one due the 26th of this month      .  cyclobenzaprine (FLEXERIL) 5 MG tablet Take 5 mg by mouth Daily.      . fluorometholone (FML) 0.1 % ophthalmic suspension Apply 1 drop to eye Twice daily.      Marland Kitchen gabapentin (NEURONTIN) 100 MG capsule Take 1 capsule (100 mg total) by mouth 2 times daily at 12 noon and 4 pm.  60 capsule  3  . levothyroxine (SYNTHROID, LEVOTHROID) 125 MCG tablet 1 TAB ONCE DAILY  30 tablet  4  . lidocaine-prilocaine (EMLA) cream Apply topically as needed.  30 g  0  . LORazepam (ATIVAN) 0.5 MG tablet Take 1 tablet (0.5 mg total) by mouth every 6 (six) hours as needed (Nausea or vomiting).  60 tablet  0  .  lovastatin (MEVACOR) 20 MG tablet TAKE 1 TABLET EVERY DAY  30 tablet  4  . magnesium chloride (SLOW-MAG) 64 MG TBEC Take 1 tablet by mouth daily.       Marland Kitchen omeprazole (PRILOSEC) 40 MG capsule TAKE 1 CAPSULE (40 MG TOTAL) BY MOUTH DAILY.  30 capsule  6  . oxyCODONE-acetaminophen (PERCOCET/ROXICET) 5-325 MG per tablet Take 1 tablet by mouth every 4 (four) hours as needed for pain.  30 tablet  0  . spironolactone (ALDACTONE) 25 MG tablet TAKE 1 TABLET (25 MG TOTAL) BY MOUTH DAILY.  30 tablet  6  . thiamine 100 MG tablet Take 100 mg by mouth daily.        . trazodone (DESYREL) 300 MG tablet TAKE 1 TABLET (300 MG TOTAL) BY MOUTH AT BEDTIME.  30 tablet  5   Current Facility-Administered Medications  Medication Dose Route Frequency Provider Last Rate Last Dose  . cyanocobalamin ((VITAMIN B-12)) injection 1,000 mcg  1,000 mcg Intramuscular Once [name], MD         Allergies  Allergen Reactions  . Aspirin Other (See Comments)    REACTION: Gi Intolerance/ Burning in stomach  . Iodine Itching    Allergic to IVP dye  . Morphine And Related Itching    History   Social History  . Marital Status: Married    Spouse Name: N/A    Number of Children: N/A  . Years of Education: N/A   Occupational History  . Not on file.   Social History Main Topics  . Smoking status: Former Smoker -- 1.00 packs/day for 10 years    Types: Cigarettes    Quit date: 09/22/1985  . Smokeless tobacco: Never Used  . Alcohol Use: No     Comment: 09/29/11 "used to drink socially years ago"  . Drug Use: No  . Sexually Active: No     Comment: HRT x many yrs   Other Topics Concern  . Not on file   Social History Narrative   Pt gets regular exercise    Family History  Problem Relation Age of Onset  . Esophageal cancer Son     deceased  . Cancer Son     Esophageal Cancer  . Diabetes Mother   . Heart disease Mother   . Colon cancer Paternal Uncle   . Cancer Paternal Uncle     Colon Cancer  . Kidney  disease Sister   . Diabetes Father   . Hypertension Father   . Kidney disease Brother   . Kidney disease Brother   . Kidney disease Brother     PHYSICAL EXAM: Filed Vitals:   10/05/12 1100  BP: 116/74  Pulse: 83   General:  Well appearing. No respiratory difficulty (husband present) HEENT:  normal Neck: supple. no JVD. Carotids 2+ bilat; no bruits. No lymphadenopathy or thryomegaly appreciated. Porta cath in place Cor: PMI nondisplaced. Regular rate & rhythm. No rubs, gallops or murmurs. Lungs: clear Abdomen: obese soft, nontender, nondistended. No hepatosplenomegaly. No bruits or masses. Good bowel sounds. Extremities: no cyanosis, clubbing, rash, lower extremity edema Neuro: alert & oriented x 3, cranial nerves grossly intact. moves all 4 extremities w/o difficulty. Affect pleasant.   ASSESSMENT & PLAN:

## 2012-10-05 NOTE — Assessment & Plan Note (Addendum)
She will complete Herceptin 10/18/12. Dr Leory Plowman reviewed and discussed ECHO. EF and lateral S' stable. No evidence of cardiotoxicity. Follow up as needed.   Patient seen and examined with Tonye Becket, NP. We discussed all aspects of the encounter. I agree with the assessment and plan as stated above. I reviewed echos personally. EF and Doppler parameters stable. No HF on exam. Will finish Herceptin next month. Can f/u prn.

## 2012-10-05 NOTE — Progress Notes (Signed)
  Echocardiogram 2D Echocardiogram has been performed.  [name] 10/05/2012, 10:59 AM

## 2012-10-05 NOTE — Patient Instructions (Addendum)
Follow up as needed

## 2012-10-18 ENCOUNTER — Ambulatory Visit: Payer: Medicare Other | Admitting: Oncology

## 2012-10-18 ENCOUNTER — Ambulatory Visit: Payer: Medicare Other

## 2012-10-18 ENCOUNTER — Other Ambulatory Visit: Payer: Medicare Other | Admitting: Lab

## 2012-10-19 ENCOUNTER — Ambulatory Visit: Payer: Medicare Other | Admitting: Oncology

## 2012-10-19 ENCOUNTER — Telehealth: Payer: Self-pay | Admitting: *Deleted

## 2012-10-19 NOTE — Telephone Encounter (Signed)
R/s pt her lab, ov, and tx. appts made and printed...td

## 2012-10-20 ENCOUNTER — Encounter: Payer: Self-pay | Admitting: Adult Health

## 2012-10-20 ENCOUNTER — Ambulatory Visit (HOSPITAL_BASED_OUTPATIENT_CLINIC_OR_DEPARTMENT_OTHER): Payer: Medicare Other | Admitting: Adult Health

## 2012-10-20 ENCOUNTER — Telehealth: Payer: Self-pay | Admitting: Oncology

## 2012-10-20 ENCOUNTER — Other Ambulatory Visit (HOSPITAL_BASED_OUTPATIENT_CLINIC_OR_DEPARTMENT_OTHER): Payer: Medicare Other | Admitting: Lab

## 2012-10-20 ENCOUNTER — Ambulatory Visit (HOSPITAL_BASED_OUTPATIENT_CLINIC_OR_DEPARTMENT_OTHER): Payer: Medicare Other

## 2012-10-20 VITALS — BP 120/81 | HR 73 | Temp 97.5°F | Resp 20 | Ht 65.0 in | Wt 206.9 lb

## 2012-10-20 DIAGNOSIS — I890 Lymphedema, not elsewhere classified: Secondary | ICD-10-CM

## 2012-10-20 DIAGNOSIS — C50319 Malignant neoplasm of lower-inner quadrant of unspecified female breast: Secondary | ICD-10-CM

## 2012-10-20 DIAGNOSIS — C50911 Malignant neoplasm of unspecified site of right female breast: Secondary | ICD-10-CM

## 2012-10-20 DIAGNOSIS — C50312 Malignant neoplasm of lower-inner quadrant of left female breast: Secondary | ICD-10-CM

## 2012-10-20 DIAGNOSIS — D6481 Anemia due to antineoplastic chemotherapy: Secondary | ICD-10-CM

## 2012-10-20 DIAGNOSIS — C50919 Malignant neoplasm of unspecified site of unspecified female breast: Secondary | ICD-10-CM

## 2012-10-20 DIAGNOSIS — Z5112 Encounter for antineoplastic immunotherapy: Secondary | ICD-10-CM

## 2012-10-20 DIAGNOSIS — T451X5A Adverse effect of antineoplastic and immunosuppressive drugs, initial encounter: Secondary | ICD-10-CM

## 2012-10-20 DIAGNOSIS — Z171 Estrogen receptor negative status [ER-]: Secondary | ICD-10-CM

## 2012-10-20 LAB — COMPREHENSIVE METABOLIC PANEL (CC13)
ALT: 22 U/L (ref 0–55)
AST: 27 U/L (ref 5–34)
Albumin: 3.6 g/dL (ref 3.5–5.0)
Calcium: 8.9 mg/dL (ref 8.4–10.4)
Chloride: 107 mEq/L (ref 98–107)
Potassium: 4.1 mEq/L (ref 3.5–5.1)
Sodium: 141 mEq/L (ref 136–145)
Total Protein: 7 g/dL (ref 6.4–8.3)

## 2012-10-20 LAB — CBC WITH DIFFERENTIAL/PLATELET
BASO%: 0 % (ref 0.0–2.0)
HCT: 30.7 % — ABNORMAL LOW (ref 34.8–46.6)
MCHC: 32.9 g/dL (ref 31.5–36.0)
MONO#: 0.3 10*3/uL (ref 0.1–0.9)
NEUT%: 59.8 % (ref 38.4–76.8)
WBC: 3.7 10*3/uL — ABNORMAL LOW (ref 3.9–10.3)
lymph#: 1.1 10*3/uL (ref 0.9–3.3)
nRBC: 0 % (ref 0–0)

## 2012-10-20 MED ORDER — SODIUM CHLORIDE 0.9 % IJ SOLN
10.0000 mL | INTRAMUSCULAR | Status: DC | PRN
  Administered 2012-10-20: 10 mL
  Filled 2012-10-20: qty 10

## 2012-10-20 MED ORDER — TRASTUZUMAB CHEMO INJECTION 440 MG
6.0000 mg/kg | Freq: Once | INTRAVENOUS | Status: AC
  Administered 2012-10-20: 567 mg via INTRAVENOUS
  Filled 2012-10-20: qty 27

## 2012-10-20 MED ORDER — ACETAMINOPHEN 325 MG PO TABS
650.0000 mg | ORAL_TABLET | Freq: Once | ORAL | Status: AC
  Administered 2012-10-20: 650 mg via ORAL

## 2012-10-20 MED ORDER — SODIUM CHLORIDE 0.9 % IV SOLN
Freq: Once | INTRAVENOUS | Status: AC
  Administered 2012-10-20: 13:00:00 via INTRAVENOUS

## 2012-10-20 MED ORDER — HEPARIN SOD (PORK) LOCK FLUSH 100 UNIT/ML IV SOLN
500.0000 [IU] | Freq: Once | INTRAVENOUS | Status: AC | PRN
  Administered 2012-10-20: 500 [IU]
  Filled 2012-10-20: qty 5

## 2012-10-20 MED ORDER — DIPHENHYDRAMINE HCL 25 MG PO CAPS
50.0000 mg | ORAL_CAPSULE | Freq: Once | ORAL | Status: AC
  Administered 2012-10-20: 50 mg via ORAL

## 2012-10-20 NOTE — Patient Instructions (Signed)
[facility] Discharge Instructions for Patients Receiving Chemotherapy  Today you received the following chemotherapy agent Herceptin.  To help prevent nausea and vomiting after your treatment, we encourage you to take your nausea medication. Begin taking your nausea medication as often as prescribed for by Dr Welton Flakes.    If you develop nausea and vomiting that is not controlled by your nausea medication, call the clinic. If it is after clinic hours your family physician or the after hours number for the clinic or go to the Emergency Department.   BELOW ARE SYMPTOMS THAT SHOULD BE REPORTED IMMEDIATELY:  *FEVER GREATER THAN 100.5 F  *CHILLS WITH OR WITHOUT FEVER  NAUSEA AND VOMITING THAT IS NOT CONTROLLED WITH YOUR NAUSEA MEDICATION  *UNUSUAL SHORTNESS OF BREATH  *UNUSUAL BRUISING OR BLEEDING  TENDERNESS IN MOUTH AND THROAT WITH OR WITHOUT PRESENCE OF ULCERS  *URINARY PROBLEMS  *BOWEL PROBLEMS  UNUSUAL RASH Items with * indicate a potential emergency and should be followed up as soon as possible.  One of the nurses will contact you 24 hours after your treatment. Please let the nurse know about any problems that you may have experienced. Feel free to call the clinic you have any questions or concerns. The clinic phone number is [phone].   I have been informed and understand all the instructions given to me. I know to contact the clinic, my physician, or go to the Emergency Department if any problems should occur. I do not have any questions at this time, but understand that I may call the clinic during office hours   should I have any questions or need assistance in obtaining follow up care.    __________________________________________  _____________  __________ Signature of Patient or Authorized Representative            Date                   Time    __________________________________________ Nurse's Signature

## 2012-10-20 NOTE — Patient Instructions (Addendum)
Doing well.  Your herceptin therapy is complete.  We will see you back in 4 months.  You will need your port flushed every 2 months.  Please call us if you have any questions or concerns.

## 2012-10-20 NOTE — Progress Notes (Signed)
OFFICE PROGRESS NOTE  CC  [name], MD [address] [place] [address] Dr. Claud Kelp  DIAGNOSIS: 67 year old female with new diagnosis of stage I ER-/PR-/Her2Neu positive invasive ductal breast cancer  PRIOR THERAPY: 1. 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 ER negative PR negative Her2 Neu positive with Ki -67 53%,   2. S/P porta cath placement for chemotherapy  #3 patient has begun her adjuvant chemotherapy consisting of Taxotere carboplatinum and Herceptin. Her treatment began in May 2013. A total of 4 cycles of Taxotere carboplatinum and Herceptin combination are planned. Once she completes this she will then proceed to radiation therapy with concomitant Herceptin to be given every 3 weeks to finish out a year of treatment.  #4 patient received 4 cycles of Taxotere carboplatinum and Herceptin between 10/30/2011 to 11/2011.  #5 Radiation therapy from 02/08/12 to 03/25/12.  #6 she then began Herceptin every 3 weeks starting on 01/20/2012.  This finished on May 22.2014.    CURRENT THERAPY: Here for Herceptin every 3 weeks  INTERVAL HISTORY: [name] 67 y.o. female returns for her last dose of Herceptin. She is doing well today.  She is here for her last treatment.  Her only concern today is that she has swelling in her left arm.  She also has some minor difficulty with range of motion.  She has not been to the lymphedema clinic.  The arm does feel heavier than the right arm.  She had her final appt with Dr. Gala Romney on 5/7 and she was cleared to finish out Herceptin and f/u with him on a PRN basis.  Otherwise, a 10 point ROS is neg.   MEDICAL HISTORY: Past Medical History  Diagnosis Date  . Obesity   . PUD (peptic ulcer disease)   . Leukopenia   . Abdominal pain   . Short bowel syndrome   . Internal hemorrhoids without mention of complication   . Stricture  and stenosis of esophagus   . Osteoarthrosis, unspecified whether generalized or localized, unspecified site   . Thyrotoxicosis without mention of goiter or other cause, without mention of thyrotoxic crisis or storm   . Other and unspecified hyperlipidemia   . Gout, unspecified   . Diverticulosis of colon (without mention of hemorrhage)   . C. difficile colitis   . VITAMIN B12 DEFICIENCY 08/30/2009  . GOITER, MULTINODULAR 04/02/2009  . HYPOTHYROIDISM, POST-RADIATION 08/13/2009  . ASYMPTOMATIC POSTMENOPAUSAL STATUS 10/11/2008  . Esophageal reflux 06/12/2008  . PONV (postoperative nausea and vomiting)   . Varicose veins   . Type II or unspecified type diabetes mellitus without mention of complication, not stated as uncontrolled     no med in years diet controled  . Blood transfusion   . UTI (urinary tract infection)   . Kidney stones     "several"  . Unspecified essential hypertension   . Angina   . Shortness of breath on exertion     "sometimes"  . Anemia   . ANEMIA, IRON DEFICIENCY 05/08/2009  . History of lower GI bleeding   . H/O hiatal hernia   . Migraines   . Breast cancer 09/29/11    invasive grade III ductal ca,assoc high grade dcis,ER/PR=neg  . History of radiation therapy 02/08/12-03/25/12    left breast,total 61gy    ALLERGIES:  is allergic to aspirin; iodine; and morphine and related.  MEDICATIONS:  Current  Outpatient Prescriptions  Medication Sig Dispense Refill  . Calcium Carbonate-Vitamin D (CALCIUM-VITAMIN D) 600-200 MG-UNIT CAPS Take 1 capsule by mouth daily.        . calcium gluconate 650 MG tablet Take 1 tablet (650 mg total) by mouth daily.  30 tablet  2  . cyanocobalamin (,VITAMIN B-12,) 1000 MCG/ML injection Inject 1,000 mcg into the muscle every 30 (thirty) days. Next one due the 26th of this month      . cyclobenzaprine (FLEXERIL) 5 MG tablet Take 5 mg by mouth Daily.      . fluorometholone (FML) 0.1 % ophthalmic suspension Apply 1 drop to eye Twice daily.       Marland Kitchen gabapentin (NEURONTIN) 100 MG capsule Take 1 capsule (100 mg total) by mouth 2 times daily at 12 noon and 4 pm.  60 capsule  3  . levothyroxine (SYNTHROID, LEVOTHROID) 125 MCG tablet 1 TAB ONCE DAILY  30 tablet  4  . LORazepam (ATIVAN) 0.5 MG tablet Take 1 tablet (0.5 mg total) by mouth every 6 (six) hours as needed (Nausea or vomiting).  60 tablet  0  . lovastatin (MEVACOR) 20 MG tablet TAKE 1 TABLET EVERY DAY  30 tablet  4  . magnesium chloride (SLOW-MAG) 64 MG TBEC Take 1 tablet by mouth daily.       Marland Kitchen omeprazole (PRILOSEC) 40 MG capsule TAKE 1 CAPSULE (40 MG TOTAL) BY MOUTH DAILY.  30 capsule  6  . oxyCODONE-acetaminophen (PERCOCET/ROXICET) 5-325 MG per tablet Take 1 tablet by mouth every 4 (four) hours as needed for pain.  30 tablet  0  . spironolactone (ALDACTONE) 25 MG tablet TAKE 1 TABLET (25 MG TOTAL) BY MOUTH DAILY.  30 tablet  6  . thiamine 100 MG tablet Take 100 mg by mouth daily.        . trazodone (DESYREL) 300 MG tablet TAKE 1 TABLET (300 MG TOTAL) BY MOUTH AT BEDTIME.  30 tablet  5   Current Facility-Administered Medications  Medication Dose Route Frequency Provider Last Rate Last Dose  . cyanocobalamin ((VITAMIN B-12)) injection 1,000 mcg  1,000 mcg Intramuscular Once [name], MD        SURGICAL HISTORY:  Past Surgical History  Procedure Laterality Date  . Vein ligation and stripping  1980's    Right leg  . Lithotripsy      "4 or 5 times"  . Bunionectomy  1970's    bilateral  . Colonoscopy    . Esophagogastroduodenoscopy  07/15/2005  . Thyroid ultrasound  12/1994 and 12/1995  . Mastectomy w/ nodes partial  09/29/11    left  . Port a cath placement  09/29/11    right chest  . Breast surgery    . Cholecystectomy  1990's  . Abdominal hysterectomy  1970's    with BSO  . Dilation and curettage of uterus    . Colon surgery      "several surgeries for short bowel syndrome"  . Abdominal adhesion surgery  1980's thru 1990's    "several"  . Kidney stone surgery   1990's    "tried to go up & get it but pushed it further up"  . Portacath placement  09/29/2011    Procedure: INSERTION PORT-A-CATH;  Surgeon: Ernestene Mention, MD;  [facility] OR;  Service: General;  Laterality: N/A;  . Breast biopsy  08/13/11    left breast lower inner quadrant  . Breast lumpectomy w/ needle localization  09/29/11    left  breast=lymph  node,excision benign/ ER/PR=neg, her 2 Positive    REVIEW OF SYSTEMS:   General: fatigue (-), night sweats (-), fever (-), pain (-) Lymph: palpable nodes (-) HEENT: vision changes (-), mucositis (-), gum bleeding (-), epistaxis (-) Cardiovascular: chest pain (-), palpitations (-) Pulmonary: shortness of breath (-), dyspnea on exertion (-), cough (-), hemoptysis (-) GI:  Early satiety (-), melena (-), dysphagia (-), nausea/vomiting (-), diarrhea (-) GU: dysuria (-), hematuria (-), incontinence (-) Musculoskeletal: joint swelling (-), joint pain (-), back pain (-) Neuro: weakness (-), numbness (-), headache (-), confusion (-) Skin: Rash (-), lesions (-), dryness (-) Psych: depression (-), suicidal/homicidal ideation (-), feeling of hopelessness (-)   PHYSICAL EXAMINATION:  BP 120/81  Pulse 73  Temp(Src) 97.5 F (36.4 C) (Oral)  Resp 20  Ht 5\' 5"  (1.651 m)  Wt 206 lb 14.4 oz (93.849 kg)  BMI 34.43 kg/m2 General: Patient is a well appearing female in no acute distress HEENT: PERRLA, sclerae anicteric no conjunctival pallor, MMM Neck: supple, no palpable adenopathy Lungs: clear to auscultation bilaterally, no wheezes, rhonchi, or rales Cardiovascular: regular rate rhythm, S1, S2, no murmurs, rubs or gallops Abdomen: Soft, non-tender, non-distended, normoactive bowel sounds, no HSM Extremities: warm and well perfused, no clubbing, cyanosis, or edema, left arm minor non-pitting edema Skin: No rashes or lesions Neuro: Non-focal Bilateral Breast Exam: left breast healing incision scar, with some tenderness, and radiation skin changes,  left breast swelling and erythema has resolved. Right breast no masses or nipple discharge ECOG PERFORMANCE STATUS: 1 - Symptomatic but completely ambulatory  LABORATORY DATA: Lab Results  Component Value Date   WBC 3.7* 10/20/2012   HGB 10.1* 10/20/2012   HCT 30.7* 10/20/2012   MCV 90.0 10/20/2012   PLT 188 10/20/2012      Chemistry      Component Value Date/Time   NA 139 09/28/2012 1013   NA 141 01/20/2012 1139   K 4.2 09/28/2012 1013   K 3.0* 01/20/2012 1139   CL 105 09/28/2012 1013   CL 103 01/20/2012 1139   CO2 25 09/28/2012 1013   CO2 23 01/20/2012 1139   BUN 14.5 09/28/2012 1013   BUN 14 01/20/2012 1139   CREATININE 1.0 09/28/2012 1013   CREATININE 0.86 01/20/2012 1139   CREATININE 0.88 03/31/2011 1550      Component Value Date/Time   CALCIUM 9.5 09/28/2012 1013   CALCIUM 6.7* 01/20/2012 1139   CALCIUM 9.5 05/15/2010 2153   ALKPHOS 81 09/28/2012 1013   ALKPHOS 105 01/20/2012 1139   AST 24 09/28/2012 1013   AST 24 01/20/2012 1139   ALT 17 09/28/2012 1013   ALT 13 01/20/2012 1139   BILITOT 0.85 09/28/2012 1013   BILITOT 0.8 01/20/2012 1139     ADDITIONAL INFORMATION: 1. PROGNOSTIC INDICATORS - ACIS Results IMMUNOHISTOCHEMICAL AND MORPHOMETRIC ANALYSIS BY THE AUTOMATED CELLULAR IMAGING SYSTEM (ACIS) Estrogen Receptor (Negative, <1%): 0%, NEGATIVE Progesterone Receptor (Negative, <1%): 0%, NEGATIVE COMMENT: The negative hormone receptor study(ies) in this case have an internal positive control. All controls stained appropriately Abigail Miyamoto MD Pathologist, Electronic Signature ( Signed 10/07/2011) FINAL DIAGNOSIS 1 of 4 FINAL for Sytsma, Cariah O ([id number]) Diagnosis 1. Breast, lumpectomy, Left - INVASIVE GRADE III, DUCTAL CARCINOMA, SPANNING 0.7 CM. - ASSOCIATED HIGH GRADED DUCTAL CARCINOMA IN SITU. - LYMPH/VASCULAR INVASION NOT IDENTIFIED. - MARGINS ARE NEGATIVE. - SEE ONCOLOGY TEMPLATE. 2. Lymph node, sentinel, biopsy, Left axillary#1 - ONE BENIGN LYMPH NODE  WITH NO TUMOR (0/1). - BENIGN GLANDULAR EPITHELIAL INCLUSIONS PRESENT. -  SEE COMMENT. 3. Breast, excision, Posterior margin - BENIGN BREAST PARENCHYMA. - NO ATYPIA, HYPERPLASIA, OR MALIGNANCY IDENTIFIED. 4. Lymph node, sentinel, biopsy, Left axillary #2 - ONE BENIGN LYMPH NODE WITH NO TUMOR SEEN (0/1). Microscopic Comment 1. BREAST, INVASIVE TUMOR, WITH LYMPH NODE SAMPLING Specimen, including laterality: Left breast with posterior margin and sentinel lymph nodes. Procedure: Left breast lumpectomy with posterior margin excision and sentinel lymph node biopsies. Grade: III. Tubule formation: 3. Nuclear pleomorphism: 3. Mitotic:3. Tumor size (gross measurement and glass slide measurement): 0.7 cm. Margins: Invasive, distance to closest margin: At least 0.8 cm. In-situ, distance to closest margin: At least 0.8 cm. Lymphovascular invasion: Not identified. Ductal carcinoma in situ: Yes. Grade: High grade. Extensive intraductal component: No. Lobular neoplasia: No. Tumor focality: Unifocal. Treatment effect: N/A. Extent of tumor: Confined to breast parenchyma. Lymph nodes: # examined: 2. Lymph nodes with metastasis: 0. Breast prognostic profile: Performed on previous case ([id number]) Estrogen receptor: 0%, negative. Progesterone receptor: 0%, negative. Her 2 neu: 3.09, amplified. Ki-67: 53%. Non-neoplastic breast: Fat necrosis present. TNM: pT1b, pN0, MX. Comments: An estrogen receptor and progesterone receptor will be repeated on the current tumor and reported in an addendum. (RAH:gt, 10/01/11) 2. The left sentinel axillary lymph node #1 shows benign glandular epithelial inclusions. Some of the inclusions are ciliated. The nuclei are bland appearing and are not malignant. Smooth muscle myosin, p63 and calponin immunohistochemical stains are performed which do not show a myoepithelial layer. 2 of 4 FINAL for Anacker, ELKE HOLTRY ([id number]) Microscopic  Comment(continued) Although this is the case, the inclusions are benign and do not represent metastatic carcinoma. Both Dr. Frederica Kuster and Dr. Colonel Bald have seen the left sentinel axillary lymph node in consultation with agreement that the inclusions are benign and do not represent metastatic carcinoma. Zandra Abts MD Pathologist, Electronic Signature (Case signed 10/02/2011) Specimen Gross and Clinical Information Specimen(s) Obtained: 1. Breast, lumpectomy, Left 2. Lymph node, sentinel, biopsy, Left axillary#1 3. Breast, excision, Posterior margin 4. Lymph node, sentinel, biopsy, Left axillary #2 Specimen Clinical  RADIOGRAPHIC STUDIES:  ASSESSMENT: 67 year old with   1. 0.7 cm high grade invasive ductal carcinoma that is ER-, PR- Her2Neu +, sentinel node negative (T1bN0) pathologic stage I.Because patient is HER-2 positive she is receiving adjuvant chemotherapy and Herceptin. She is going to be receiving 4 cycles of TCH combination and then Herceptin alone to complete out 1years worth of treatment.  2.Receiving radiation therapy  3. Left knee pain and left leg swelling patient at this time does not want to have her knee replacement surgery she wants to wait until she completes all of her Herceptin.  4. Anemia due to chemotherapy  5. Left breast swelling patient was seen by Dr. Claud Kelp and she has been reassured that this is nothing dangerous.  6. Lymphedema  PLAN:   #1 Ms. Montufar will receive her final dose of Herceptin today.  She will keep her port for the time being and I have requested flushes for this to be scheduled.    #2 The erythema and swelling in Ms. Lac's left breast has resolved. She had a mammogram in early may that was normal.    #3 she will return in 4 months time for labs and an evaluation.   #4 I referred her to the lymphedema clinic for therapy for the left arm.   All questions were answered. The patient knows to call the clinic with any problems,  questions or concerns. We can certainly see the patient much sooner  if necessary.  I spent 25 minutes counseling the patient face to face. The total time spent in the appointment was 30 minutes.  Cherie Ouch Lyn Hollingshead, NP Medical Oncology [facility] Phone: [phone]

## 2012-11-01 ENCOUNTER — Ambulatory Visit: Payer: Medicare Other | Admitting: Physical Therapy

## 2012-11-10 ENCOUNTER — Other Ambulatory Visit (INDEPENDENT_AMBULATORY_CARE_PROVIDER_SITE_OTHER): Payer: Medicare Other

## 2012-11-10 ENCOUNTER — Encounter: Payer: Self-pay | Admitting: Nurse Practitioner

## 2012-11-10 ENCOUNTER — Telehealth: Payer: Self-pay | Admitting: Gastroenterology

## 2012-11-10 ENCOUNTER — Ambulatory Visit (INDEPENDENT_AMBULATORY_CARE_PROVIDER_SITE_OTHER): Payer: Medicare Other | Admitting: Nurse Practitioner

## 2012-11-10 VITALS — BP 124/84 | HR 72 | Ht 64.5 in | Wt 198.5 lb

## 2012-11-10 DIAGNOSIS — R197 Diarrhea, unspecified: Secondary | ICD-10-CM

## 2012-11-10 MED ORDER — RIFAXIMIN 550 MG PO TABS: 1100.0000 mg | ORAL_TABLET | Freq: Two times a day (BID) | ORAL | Status: DC

## 2012-11-10 MED ORDER — OXYCODONE-ACETAMINOPHEN 5-325 MG PO TABS: 1.0000 | ORAL_TABLET | ORAL | Status: DC | PRN

## 2012-11-10 MED ORDER — DIPHENOXYLATE-ATROPINE 2.5-0.025 MG PO TABS: ORAL_TABLET | ORAL | Status: DC

## 2012-11-10 NOTE — Patient Instructions (Addendum)
Your physician has requested that you go to the basement for lab work before leaving today  We have sent the following medications to your pharmacy for you to pick up at your convenience:  Xifaxan, Lomotil, and Oxycodone  You may discontinue the Imodium.  Please follow up with Dr. Jarold Motto in approximately 3 weeks

## 2012-11-10 NOTE — Telephone Encounter (Signed)
Old pt of Dr Jarold Motto with hx of Short Gut from Diverticulitis and intestinal obstructions. She had Breast Cancer last year and is still receiving herceptin. She c/o diarrhea and abdominal pain that began before last weekend. The pain is mid abdomen above and below the navel and she has already had 4 loose stools this am. She denies blood in her stool or a temp. Pt will see Willette Cluster, NP this pm.

## 2012-11-10 NOTE — Progress Notes (Signed)
  History of Present Illness:   Patient is a 67 year old female with multiple medical problems, on multiple medications. This is my first time meeting patient. She is known to Dr. Jarold Motto and has a complex GI surgical history having had multiple bowel resections secondary to adhesions related to prior abdominal surgeries.  Her last colonoscopy was in 2012 with findings of a sigmoid anastomosis and hemorrhoids. Patient has chronic abdominal pain. She used to problems with diarrhea but not in the last year or so. Patient hasn't been seen here in a couple of years, she was diagnosed with breast cancer  In early 2014 and is s/p partial mastectomy of left breast and chemoxrt.   Patient worked in today for evaluation of abdominal pain and diarrhea. Typically has 1-2 formed stools a day. Last Thursday she developed diffuse abdominal pain followed by non-bloody diarrhea. Symptoms have continued, occur after meals and even wake her up at night. She is averaging at least 10 loose BMs a day with associated urgency. Diffuse abdominal pain is constant, no related to meals or defecation.  No antibiotics in last few months. No recent travel, no sick contacts. No recent medication changes.  Current Medications, Allergies, Past Medical History, Past Surgical History, Family History and Social History were reviewed in [employer] record.  Physical Exam: General: Well developed , black female in no acute distress Head: Normocephalic and atraumatic Eyes:  sclerae anicteric, conjunctiva pink  Ears: Normal auditory acuity Lungs: Clear throughout to auscultation Heart: Regular rate and rhythm Abdomen: Soft, non distended, mild diffuse tenderness. . No masses, multiple old surgical scars.Normal bowel sounds Musculoskeletal: Symmetrical with no gross deformities  Extremities: No edema  Neurological: Alert oriented x 4, grossly nonfocal Psychological:  Alert and cooperative. Normal mood and  affect  Assessment and Recommendations: 1. Diarrhea / abdominal pain. These used to be chronic problems for patient but not in the last year or so. Rule out c-diff or other infectious colitis.  She may small bowel intestinal overgrowth with history of multiple bowel resections. This may just be an IBS flare. Will check stool studies today but results will take a couple of days and tomorrow is Friday so will treat empirically for small intestine bacterial overgrowth. She cannot take Flagyl , will see if insurance will pay for xifaxan. Imodium not helping, will change to Lomotil 2-3 times daily as needed.  She is requesting something for pain. Will refill her pain medication. Patient will follow up in 3 weeks with Dr. Jarold Motto. I will call her with for condition update and to discuss test results in the interim.   2. Left breast cancer diagnosed earlier this year. She is s/p partial mastectomy and chemoxrt.

## 2012-11-11 ENCOUNTER — Encounter: Payer: Self-pay | Admitting: Nurse Practitioner

## 2012-11-14 LAB — STOOL CULTURE

## 2012-11-23 ENCOUNTER — Telehealth: Payer: Self-pay | Admitting: *Deleted

## 2012-11-23 NOTE — Telephone Encounter (Signed)
Message copied by Florene Glen on Wed Nov 23, 2012 10:47 AM ------      Message from: Daphine Deutscher      Created: Mon Nov 21, 2012 11:46 AM                   ----- Message -----         From: Meredith Pel, NP         Sent: 11/15/2012   1:05 PM           To: Daphine Deutscher, RN            Rene Kocher, please let her know stool studies negative except for positive lactoferrin. Could still be infectious. Did she get Xifaxan and how is she doing. If still having problems please talk to her GI doc as I am going out of town for a few days. Thanks ------

## 2012-11-23 NOTE — Telephone Encounter (Signed)
Spoke with pt and apologized that it took so long to get back with her; there was a mix up in routing and Dr Jarold Motto did not get the message. Pt states she did not take the Xifaxan d/t cost > $300. She does report the diarrhea slowed down and eventually stopped while talking prochloperaze (?). Pt has a f/u on 12/01/12 that she intends to keep. Instructed pt to call for problems.

## 2012-11-25 ENCOUNTER — Ambulatory Visit (INDEPENDENT_AMBULATORY_CARE_PROVIDER_SITE_OTHER): Payer: Medicare Other

## 2012-11-25 DIAGNOSIS — E538 Deficiency of other specified B group vitamins: Secondary | ICD-10-CM

## 2012-11-25 MED ORDER — CYANOCOBALAMIN 1000 MCG/ML IJ SOLN
1000.0000 ug | INTRAMUSCULAR | Status: DC
  Administered 2012-11-25: 1000 ug via INTRAMUSCULAR

## 2012-12-01 ENCOUNTER — Ambulatory Visit (INDEPENDENT_AMBULATORY_CARE_PROVIDER_SITE_OTHER): Payer: Medicare Other | Admitting: Gastroenterology

## 2012-12-01 ENCOUNTER — Encounter: Payer: Self-pay | Admitting: Gastroenterology

## 2012-12-01 VITALS — BP 110/80 | HR 82 | Ht 64.5 in | Wt 201.2 lb

## 2012-12-01 DIAGNOSIS — K6389 Other specified diseases of intestine: Secondary | ICD-10-CM

## 2012-12-01 DIAGNOSIS — Z9889 Other specified postprocedural states: Secondary | ICD-10-CM

## 2012-12-01 DIAGNOSIS — R1032 Left lower quadrant pain: Secondary | ICD-10-CM

## 2012-12-01 DIAGNOSIS — R197 Diarrhea, unspecified: Secondary | ICD-10-CM

## 2012-12-01 DIAGNOSIS — Z9089 Acquired absence of other organs: Secondary | ICD-10-CM

## 2012-12-01 DIAGNOSIS — Z9049 Acquired absence of other specified parts of digestive tract: Secondary | ICD-10-CM

## 2012-12-01 DIAGNOSIS — Z853 Personal history of malignant neoplasm of breast: Secondary | ICD-10-CM

## 2012-12-01 MED ORDER — CIPROFLOXACIN HCL 500 MG PO TABS: 500.0000 mg | ORAL_TABLET | Freq: Two times a day (BID) | ORAL | Status: DC

## 2012-12-01 MED ORDER — ALIGN PO CAPS: 1.0000 | ORAL_CAPSULE | Freq: Every day | ORAL | Status: DC

## 2012-12-01 NOTE — Patient Instructions (Signed)
  We have sent the following medications to your pharmacy for you to pick up at your convenience: Cipro 500 mg, please take one tablet by mouth twice daily for ten days  We have given you samples of Align. This puts good bacteria back into your colon. You should take 1 capsule by mouth once daily. If this works well for you, it can be purchased over the counter.  Please call [name], Dr. Norval Gable nurse, when you finish the Cipro to give an update on how you are feeling. ______________________________________________________________________________________________________________________________                                               We are excited to introduce MyChart, a new best-in-class service that provides you online access to important information in your electronic medical record. We want to make it easier for you to view your health information - all in one secure location - when and where you need it. We expect MyChart will enhance the quality of care and service we provide.  When you register for MyChart, you can:    View your test results.    Request appointments and receive appointment reminders via email.    Request medication renewals.    View your medical history, allergies, medications and immunizations.    Communicate with your physician's office through a password-protected site.    Conveniently print information such as your medication lists.  To find out if MyChart is right for you, please talk to a member of our clinical staff today. We will gladly answer your questions about this free health and wellness tool.  If you are age 39 or older and want a member of your family to have access to your record, you must provide written consent by completing a proxy form available at our office. Please speak to our clinical staff about guidelines regarding accounts for patients younger than age 72.  As you activate your MyChart account and need any technical  assistance, please call the MyChart technical support line at [phone]) or email your question to [email or web address]. If you email your question(s), please include your name, a return phone number and the best time to reach you.  If you have non-urgent health-related questions, you can send a message to our office through MyChart at [email or web address]. If you have a medical emergency, call 911.  Thank you for using MyChart as your new health and wellness resource!   MyChart licensed from [employer],  [id number]. Patents Pending.

## 2012-12-01 NOTE — Progress Notes (Signed)
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  She actually been doing well until last several weeks when she's had some recurrence of her left lower quadrant discomfort and more stooling than usual without melena or hematochezia.Marland Kitchen  She was seen by our nurse practitioner, and prescribe Xifaxan which she did not take because of cost expense.  Stool exams were all negative, but she did have positive lactoferrin stool exam.  Patient is completing currently her chemotherapy radiation therapy for breast cancer, has had surgery by Dr. [name].  Review of her labs shows no specific abnormalities except for mild leukopenia with a white count of 3700.  Stool exam for C. difficile negative as was stool routine culture and sensitivity..  Patient complains of some left lower quadrant discomfort, but of apparently her diarrhea is only slightly better.  She denies infectious disease exposure, recent antibiotic use, travel, use of NSAIDs.  She denies fever, chills, or other systemic or upper GI or hepatobiliary complaints.  Current Medications, Allergies, Past Medical History, Past Surgical History, Family History and Social History were reviewed in [employer] record.  ROS: All systems were reviewed and are negative unless otherwise stated in the HPI.          Physical Exam: Blood pressure 110/80, pulse 82 and regular and weight 201 with a BMI of 34.  Her abdomen has a roadmap appearance for multiple surgical scars.  There is no definite organomegaly, but she has some tenderness the left lower quadrant with palpable areas of scar tissue and adhesions.  There is no rebound tenderness, and bowel sounds are nonobstructive in nature.  Rectal exam is deferred.    Assessment and Plan: Patient with chronic, chronic abdominal pain and chronic diarrhea who for some reason  to be doing well over the last year until recent exacerbation.  Because of her tenderness and continued problems, have decided empirically placed her on Cipro 500 mg twice a day for 10 days with Align probiotic therapy.  She cannot afford Xifaxan, has had previous systemic reactions to metronidazole.  Flexible sigmoidoscopy done 2 years ago and showed almost total colectomy.  Her last clinic visit with a nurse practitioner, she did have her narcotic prescription refill.  We will see her when necessary as needed.  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  Please copy Dr. Lucita Lora and Midge Minium  No diagnosis found.

## 2012-12-08 ENCOUNTER — Ambulatory Visit (INDEPENDENT_AMBULATORY_CARE_PROVIDER_SITE_OTHER): Payer: Medicare Other | Admitting: General Surgery

## 2012-12-20 ENCOUNTER — Ambulatory Visit (HOSPITAL_BASED_OUTPATIENT_CLINIC_OR_DEPARTMENT_OTHER): Payer: Medicare Other

## 2012-12-20 VITALS — BP 136/91 | HR 74 | Temp 97.2°F

## 2012-12-20 DIAGNOSIS — C50912 Malignant neoplasm of unspecified site of left female breast: Secondary | ICD-10-CM

## 2012-12-20 DIAGNOSIS — Z452 Encounter for adjustment and management of vascular access device: Secondary | ICD-10-CM

## 2012-12-20 DIAGNOSIS — C50919 Malignant neoplasm of unspecified site of unspecified female breast: Secondary | ICD-10-CM

## 2012-12-20 MED ORDER — SODIUM CHLORIDE 0.9 % IJ SOLN
10.0000 mL | INTRAMUSCULAR | Status: DC | PRN
  Administered 2012-12-20: 10 mL via INTRAVENOUS
  Filled 2012-12-20: qty 10

## 2012-12-20 MED ORDER — HEPARIN SOD (PORK) LOCK FLUSH 100 UNIT/ML IV SOLN
500.0000 [IU] | Freq: Once | INTRAVENOUS | Status: AC
  Administered 2012-12-20: 500 [IU] via INTRAVENOUS
  Filled 2012-12-20: qty 5

## 2012-12-23 ENCOUNTER — Ambulatory Visit (INDEPENDENT_AMBULATORY_CARE_PROVIDER_SITE_OTHER): Payer: Medicare Other

## 2012-12-23 DIAGNOSIS — E538 Deficiency of other specified B group vitamins: Secondary | ICD-10-CM

## 2012-12-23 MED ORDER — CYANOCOBALAMIN 1000 MCG/ML IJ SOLN
1000.0000 ug | Freq: Once | INTRAMUSCULAR | Status: AC
  Administered 2012-12-23: 1000 ug via INTRAMUSCULAR

## 2012-12-26 ENCOUNTER — Encounter: Payer: Self-pay | Admitting: Gastroenterology

## 2012-12-26 ENCOUNTER — Other Ambulatory Visit (INDEPENDENT_AMBULATORY_CARE_PROVIDER_SITE_OTHER): Payer: Medicare Other

## 2012-12-26 ENCOUNTER — Ambulatory Visit (INDEPENDENT_AMBULATORY_CARE_PROVIDER_SITE_OTHER): Payer: Medicare Other | Admitting: Gastroenterology

## 2012-12-26 VITALS — BP 110/82 | HR 73 | Ht 64.0 in | Wt 198.2 lb

## 2012-12-26 DIAGNOSIS — T451X5A Adverse effect of antineoplastic and immunosuppressive drugs, initial encounter: Secondary | ICD-10-CM

## 2012-12-26 DIAGNOSIS — Z9089 Acquired absence of other organs: Secondary | ICD-10-CM

## 2012-12-26 DIAGNOSIS — Z9889 Other specified postprocedural states: Secondary | ICD-10-CM

## 2012-12-26 DIAGNOSIS — R109 Unspecified abdominal pain: Secondary | ICD-10-CM

## 2012-12-26 DIAGNOSIS — Z9049 Acquired absence of other specified parts of digestive tract: Secondary | ICD-10-CM

## 2012-12-26 DIAGNOSIS — D6481 Anemia due to antineoplastic chemotherapy: Secondary | ICD-10-CM

## 2012-12-26 DIAGNOSIS — Z842 Family history of other diseases of the genitourinary system: Secondary | ICD-10-CM

## 2012-12-26 DIAGNOSIS — K602 Anal fissure, unspecified: Secondary | ICD-10-CM

## 2012-12-26 DIAGNOSIS — F339 Major depressive disorder, recurrent, unspecified: Secondary | ICD-10-CM

## 2012-12-26 DIAGNOSIS — Z853 Personal history of malignant neoplasm of breast: Secondary | ICD-10-CM

## 2012-12-26 LAB — BASIC METABOLIC PANEL
Calcium: 9.7 mg/dL (ref 8.4–10.5)
GFR: 61.45 mL/min (ref >60.00)
Potassium: 4 mEq/L (ref 3.5–5.1)
Sodium: 137 mEq/L (ref 135–145)

## 2012-12-26 MED ORDER — HYDROCODONE-ACETAMINOPHEN 5-325 MG PO TABS: 1.0000 | ORAL_TABLET | Freq: Four times a day (QID) | ORAL | Status: DC | PRN

## 2012-12-26 NOTE — Progress Notes (Signed)
This is a 67 year old African American female who has had multiple surgeries and almost total colectomy for many years with resultant stable short bowel syndrome.  She is recently been diagnosed with breast cancer in May of 2013 with left mastectomy, radiation therapy, and she is still undergoing chemotherapy per oncology.  She describes now several months of sharp pain in the mid lower abdomen which has no real precipitating or alleviating elements.  She has some intermittent bright red blood per rectum but denies serious diarrhea, and has had recent stool exam for C. difficile was negative.  She is up-to-date on her colonoscopy, and really has had an almost total colectomy because of previous surgery for intestinal adhesions.  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  She denies upper GI or hepatobiliary complaints, but does have intermittent nausea and vomiting apparently related to her chemotherapy therapy with Herceptin every 3 weeks.  Lab review shows a mild anemia related to chemotherapy.  In the past, this patient has had similar problems with abdominal pain felt secondary to intestinal adhesions.  She also has had multiple other medical problems and is on multiple medications.  She is status post cholecystectomy, hysterectomy, and also bilateral oophorectomy.  Current Medications, Allergies, Past Medical History, Past Surgical History, Family History and Social History were reviewed in [employer] record.  ROS: All systems were reviewed and are negative unless otherwise stated in the HPI.      Allergies  Allergen Reactions  . Aspirin Other (See Comments)    REACTION: Gi Intolerance/ Burning in stomach  . Iodine Itching    Allergic to IVP dye  . Morphine And Related Itching   Outpatient Prescriptions Prior to Visit  Medication Sig Dispense Refill  . bifidobacterium infantis (ALIGN) capsule Take 1 capsule by mouth  daily.  14 capsule  0  . Calcium Carbonate-Vitamin D (CALCIUM-VITAMIN D) 600-200 MG-UNIT CAPS Take 1 capsule by mouth daily.        . calcium gluconate 650 MG tablet Take 1 tablet (650 mg total) by mouth daily.  30 tablet  2  . ciprofloxacin (CIPRO) 500 MG tablet Take 1 tablet (500 mg total) by mouth 2 (two) times daily.  20 tablet  0  . cyanocobalamin (,VITAMIN B-12,) 1000 MCG/ML injection Inject 1,000 mcg into the muscle every 30 (thirty) days. Next one due the 26th of this month      . diphenoxylate-atropine (LOMOTIL) 2.5-0.025 MG per tablet Take 1 tablet by mouth 2 - 3 times daily  30 tablet  0  . fluorometholone (FML) 0.1 % ophthalmic suspension Apply 1 drop to eye Twice daily.      Marland Kitchen levothyroxine (SYNTHROID, LEVOTHROID) 125 MCG tablet 1 TAB ONCE DAILY  30 tablet  4  . lovastatin (MEVACOR) 20 MG tablet TAKE 1 TABLET EVERY DAY  30 tablet  4  . magnesium chloride (SLOW-MAG) 64 MG TBEC Take 1 tablet by mouth daily.       Marland Kitchen omeprazole (PRILOSEC) 40 MG capsule TAKE 1 CAPSULE (40 MG TOTAL) BY MOUTH DAILY.  30 capsule  6  . oxyCODONE-acetaminophen (PERCOCET/ROXICET) 5-325 MG per tablet Take 1 tablet by mouth every 4 (four) hours as needed for pain.  30 tablet  0  . thiamine 100 MG tablet Take 100 mg by mouth daily.        . trazodone (DESYREL) 300 MG tablet TAKE 1 TABLET (300 MG TOTAL) BY MOUTH AT BEDTIME.  30 tablet  5   Facility-Administered Medications Prior to Visit  Medication Dose Route Frequency Provider Last Rate Last Dose  . cyanocobalamin ((VITAMIN B-12)) injection 1,000 mcg  1,000 mcg Intramuscular Once [name], MD      . cyanocobalamin ((VITAMIN B-12)) injection 1,000 mcg  1,000 mcg Intramuscular Q30 days [name], MD   1,000 mcg at 11/25/12 1610   Past Medical History  Diagnosis Date  . Obesity   . PUD (peptic ulcer disease)   . Leukopenia   . Abdominal pain   . Short bowel syndrome   . Internal hemorrhoids without mention of complication   . Stricture and stenosis of  esophagus   . Osteoarthrosis, unspecified whether generalized or localized, unspecified site   . Thyrotoxicosis without mention of goiter or other cause, without mention of thyrotoxic crisis or storm   . Other and unspecified hyperlipidemia   . Gout, unspecified   . Diverticulosis of colon (without mention of hemorrhage)   . C. difficile colitis   . VITAMIN B12 DEFICIENCY 08/30/2009  . GOITER, MULTINODULAR 04/02/2009  . HYPOTHYROIDISM, POST-RADIATION 08/13/2009  . ASYMPTOMATIC POSTMENOPAUSAL STATUS 10/11/2008  . Esophageal reflux 06/12/2008  . PONV (postoperative nausea and vomiting)   . Varicose veins   . Type II or unspecified type diabetes mellitus without mention of complication, not stated as uncontrolled     no med in years diet controled  . Blood transfusion   . UTI (urinary tract infection)   . Kidney stones     "several"  . Unspecified essential hypertension   . Angina   . Shortness of breath on exertion     "sometimes"  . Anemia   . ANEMIA, IRON DEFICIENCY 05/08/2009  . History of lower GI bleeding   . H/O hiatal hernia   . Migraines   . Breast cancer 09/29/11    invasive grade III ductal ca,assoc high grade dcis,ER/PR=neg  . History of radiation therapy 02/08/12-03/25/12    left breast,total 61gy   Past Surgical History  Procedure Laterality Date  . Vein ligation and stripping  1980's    Right leg  . Lithotripsy      "4 or 5 times"  . Bunionectomy  1970's    bilateral  . Colonoscopy    . Esophagogastroduodenoscopy  07/15/2005  . Thyroid ultrasound  12/1994 and 12/1995  . Mastectomy w/ nodes partial  09/29/11    left  . Port a cath placement  09/29/11    right chest  . Breast surgery    . Cholecystectomy  1990's  . Abdominal hysterectomy  1970's    with BSO  . Dilation and curettage of uterus    . Colon surgery      "several surgeries for short bowel syndrome"  . Abdominal adhesion surgery  1980's thru 1990's    "several"  . Kidney stone surgery  1990's    "tried  to go up & get it but pushed it further up"  . Portacath placement  09/29/2011    Procedure: INSERTION PORT-A-CATH;  Surgeon: Ernestene Mention, MD;  Location: [facility] OR;  Service: General;  Laterality: N/A;  . Breast biopsy  08/13/11    left breast lower inner quadrant  . Breast lumpectomy w/ needle localization  09/29/11    left  breast=lymph node,excision benign/ ER/PR=neg, her 2 Positive   History   Social History  . Marital Status: Married    Spouse Name: N/A    Number of Children: N/A  . Years of Education: N/A  Social History Main Topics  . Smoking status: Former Smoker -- 1.00 packs/day for 10 years    Types: Cigarettes    Quit date: 09/22/1985  . Smokeless tobacco: Never Used  . Alcohol Use: No     Comment: 09/29/11 "used to drink socially years ago"  . Drug Use: No  . Sexually Active: No     Comment: HRT x many yrs   Other Topics Concern  . None   Social History Narrative   Pt gets regular exercise   Family History  Problem Relation Age of Onset  . Esophageal cancer Son     deceased  . Cancer Son     Esophageal Cancer  . Diabetes Mother   . Heart disease Mother   . Colon cancer Paternal Uncle   . Cancer Paternal Uncle     Colon Cancer  . Kidney disease Sister   . Diabetes Father   . Hypertension Father   . Kidney disease Brother   . Kidney disease Brother   . Kidney disease Brother           Physical Exam: Depressed-appearing patient in no acute distress.  3, and weight 198 with a BMI of 34.01.  There are multiple abdominal scars with steatosis and some tenderness focally in the anterior suprapubic area.  Otherwise abdominal exam is unremarkable without organomegaly, masses or tenderness.  There no abnormal bowel sounds.  Rectal exam shows a posterior fissure with some slight heme to digital exam.  Mental status shows her to be more depressed than usual.  She does not appear dehydrated or toxic.   Assessment and Plan: Mrs. Berish has had post surgical  abdominal pain for many many years  whichseems to have relapsed with her most recent battle with breast cancer that  resulted in mastectomy, chemotherapy and radiation therapy.  We've tried multiple medications and maneuvers in the past to no avail abdominal pain and chronic diarrhea.  Diarrhea currently is not a major problem..  She obviously is a poor surgical candidate for any type of extensive surgical adhesion manipulation.  I have placed her on hydrocodone 5 mg every 6-8 hours as tolerated since this seems to have been the mainstay of her pain management for many years.  I have ordered CT scan of the abdomen and pelvis also for review, and repeat C. difficile exam of her stool.  For her anal fissure I prescribed Anusol-HC suppositories at bedtime.  She may need referral to a pain clinic for long-term pain management.  Will send a copy this note to Augustin Schooling in oncology who is managing this patient's chemotherapy.  Also copy Dr. Delane Ginger in surgery.  She is on Desyrel 300 mg at bedtime, and may need psychological referral because of her depression related to her multiple medical issues.  Cc: Augustin Schooling, nurse practitioner in oncology and Dr. Claud Kelp, surgery, Dr. Everardo All in primary care

## 2012-12-26 NOTE — Patient Instructions (Addendum)
  You have been scheduled for a CT Abdomen/Pelvis at [facility] Radiology Department for Wednesday January 04, 2013 at 12:30 pm, please arrive 15 minutes early for registration.  Please go this week to pick up the contrast and the allergy prep kit prescription you will need for the scan   Please go to basement for lab work today.  We have sent the following medications to your pharmacy for you to pick up at your convenience: Anusol, please take one suppository at bedtime.  Hydrocodone 5-325 mg was faxed to your pharmacy.  ________________________________________________________________________                                               We are excited to introduce MyChart, a new best-in-class service that provides you online access to important information in your electronic medical record. We want to make it easier for you to view your health information - all in one secure location - when and where you need it. We expect MyChart will enhance the quality of care and service we provide.  When you register for MyChart, you can:    View your test results.    Request appointments and receive appointment reminders via email.    Request medication renewals.    View your medical history, allergies, medications and immunizations.    Communicate with your physician's office through a password-protected site.    Conveniently print information such as your medication lists.  To find out if MyChart is right for you, please talk to a member of our clinical staff today. We will gladly answer your questions about this free health and wellness tool.  If you are age 69 or older and want a member of your family to have access to your record, you must provide written consent by completing a proxy form available at our office. Please speak to our clinical staff about guidelines regarding accounts for patients younger than age 8.  As you activate your MyChart account and need any technical  assistance, please call the MyChart technical support line at [phone]) or email your question to [email or web address]. If you email your question(s), please include your name, a return phone number and the best time to reach you.  If you have non-urgent health-related questions, you can send a message to our office through MyChart at [email or web address]. If you have a medical emergency, call 911.  Thank you for using MyChart as your new health and wellness resource!   MyChart licensed from [employer],  9811-9147. Patents Pending.

## 2012-12-28 ENCOUNTER — Other Ambulatory Visit: Payer: Medicare Other

## 2012-12-28 DIAGNOSIS — Z9049 Acquired absence of other specified parts of digestive tract: Secondary | ICD-10-CM

## 2012-12-28 DIAGNOSIS — K602 Anal fissure, unspecified: Secondary | ICD-10-CM

## 2012-12-28 DIAGNOSIS — R109 Unspecified abdominal pain: Secondary | ICD-10-CM

## 2012-12-28 DIAGNOSIS — F339 Major depressive disorder, recurrent, unspecified: Secondary | ICD-10-CM

## 2012-12-28 DIAGNOSIS — D6481 Anemia due to antineoplastic chemotherapy: Secondary | ICD-10-CM

## 2012-12-28 DIAGNOSIS — Z853 Personal history of malignant neoplasm of breast: Secondary | ICD-10-CM

## 2012-12-28 DIAGNOSIS — Z842 Family history of other diseases of the genitourinary system: Secondary | ICD-10-CM

## 2012-12-29 LAB — CLOSTRIDIUM DIFFICILE BY PCR: Toxigenic C. Difficile by PCR: NOT DETECTED

## 2013-01-04 ENCOUNTER — Other Ambulatory Visit: Payer: Self-pay | Admitting: Gastroenterology

## 2013-01-04 ENCOUNTER — Encounter (HOSPITAL_COMMUNITY): Payer: Self-pay

## 2013-01-04 ENCOUNTER — Ambulatory Visit (HOSPITAL_COMMUNITY)
Admission: RE | Admit: 2013-01-04 | Discharge: 2013-01-04 | Disposition: A | Discharge status: Home / Self Care | Payer: Medicare Other | Source: Ambulatory Visit | Attending: Gastroenterology | Admitting: Gastroenterology

## 2013-01-04 ENCOUNTER — Other Ambulatory Visit: Payer: Self-pay

## 2013-01-04 DIAGNOSIS — R109 Unspecified abdominal pain: Secondary | ICD-10-CM | POA: Insufficient documentation

## 2013-01-04 DIAGNOSIS — K8689 Other specified diseases of pancreas: Secondary | ICD-10-CM | POA: Insufficient documentation

## 2013-01-04 DIAGNOSIS — N2889 Other specified disorders of kidney and ureter: Secondary | ICD-10-CM | POA: Insufficient documentation

## 2013-01-04 DIAGNOSIS — Z90710 Acquired absence of both cervix and uterus: Secondary | ICD-10-CM | POA: Insufficient documentation

## 2013-01-04 DIAGNOSIS — C50919 Malignant neoplasm of unspecified site of unspecified female breast: Secondary | ICD-10-CM | POA: Insufficient documentation

## 2013-01-04 DIAGNOSIS — K660 Peritoneal adhesions (postprocedural) (postinfection): Secondary | ICD-10-CM

## 2013-01-04 DIAGNOSIS — K449 Diaphragmatic hernia without obstruction or gangrene: Secondary | ICD-10-CM | POA: Insufficient documentation

## 2013-01-04 DIAGNOSIS — Z9221 Personal history of antineoplastic chemotherapy: Secondary | ICD-10-CM | POA: Insufficient documentation

## 2013-01-04 MED ORDER — LOVASTATIN 20 MG PO TABS: ORAL_TABLET | ORAL | Status: DC

## 2013-01-04 MED ORDER — IOHEXOL 300 MG/ML  SOLN
100.0000 mL | Freq: Once | INTRAMUSCULAR | Status: AC | PRN
  Administered 2013-01-04: 100 mL via INTRAVENOUS

## 2013-01-05 ENCOUNTER — Other Ambulatory Visit: Payer: Self-pay

## 2013-01-05 MED ORDER — LOVASTATIN 20 MG PO TABS: ORAL_TABLET | ORAL | Status: DC

## 2013-01-09 ENCOUNTER — Ambulatory Visit (INDEPENDENT_AMBULATORY_CARE_PROVIDER_SITE_OTHER): Payer: Medicare Other | Admitting: General Surgery

## 2013-01-09 ENCOUNTER — Encounter (INDEPENDENT_AMBULATORY_CARE_PROVIDER_SITE_OTHER): Payer: Self-pay | Admitting: General Surgery

## 2013-01-09 VITALS — BP 116/66 | HR 75 | Temp 96.9°F | Resp 16 | Ht 64.0 in | Wt 198.2 lb

## 2013-01-09 DIAGNOSIS — R109 Unspecified abdominal pain: Secondary | ICD-10-CM

## 2013-01-09 NOTE — Progress Notes (Signed)
Patient ID: [name], female   DOB: Oct 11, 1945, 67 y.o.   MRN: [id number]  Chief Complaint  Patient presents with  . New Evaluation    eval abd adhesions    HPI [name] is a 67 y.o. female.  She is referred to me by Dr. Sheryn Bison, [facility], for evaluation of chronic abdominal pain, possible adhesions.   She coincidentally is a breast cancer patient of mine who underwent left partial mastectomy, sentinel lymph node biopsy and Port-A-Cath insertion on 09/29/2011. She recovered from that surgery. She had a receptor negative, HER-2 positive invasive ductal carcinoma, stage TI B., N0, BRCA negative. She continues to be followed regularly but by Dr. Drue Second as I said that is the she will call for her breast cancer.  She has a long history of abdominal problems. She had a hysterectomy and BSO in 1970 for benign disease. She's had 3 or 4 laparotomies by Dr. Lurene Shadow and Dr. Wiliam Ke for SBO. She states she's had small bowel resection several times. She's had an open cholecystectomy. In 1997 Lovie Chol attempted laparotomy but aborted the laparotomy due to "concrete abdomen. She felt the abdomen could not be explored. Several enterotomies occurred and she was placed on TNA and she ultimately slowly recovered. She denies any history of colon resection. She has problems with chronic bacterial overgrowth and diarrhea . She has chronic abdominal pain but she says for the past one to 2 months this has been  worse she states the pain is central and left lower quadrant. She sometimes vomits. She has not lost any significant weight.   CT scan of the abdomen was performed on 01/04/2013. This shows that the majority of the colon is present. The small bowel loops are anteriorly located suggesting adhesions. There is no transition zone. There is no inflammatory change. There is no obstruction. There is no free fluid. There is no adenopathy or sign of any tumor. There is no abdominal  wall hernia.  She is in no acute distress today. Her husband is with her. HPI  Past Medical History  Diagnosis Date  . Obesity   . PUD (peptic ulcer disease)   . Leukopenia   . Abdominal pain   . Short bowel syndrome   . Internal hemorrhoids without mention of complication   . Stricture and stenosis of esophagus   . Osteoarthrosis, unspecified whether generalized or localized, unspecified site   . Thyrotoxicosis without mention of goiter or other cause, without mention of thyrotoxic crisis or storm   . Other and unspecified hyperlipidemia   . Gout, unspecified   . Diverticulosis of colon (without mention of hemorrhage)   . C. difficile colitis   . VITAMIN B12 DEFICIENCY 08/30/2009  . GOITER, MULTINODULAR 04/02/2009  . HYPOTHYROIDISM, POST-RADIATION 08/13/2009  . ASYMPTOMATIC POSTMENOPAUSAL STATUS 10/11/2008  . Esophageal reflux 06/12/2008  . PONV (postoperative nausea and vomiting)   . Varicose veins   . Type II or unspecified type diabetes mellitus without mention of complication, not stated as uncontrolled     no med in years diet controled  . Blood transfusion   . UTI (urinary tract infection)   . Kidney stones     "several"  . Unspecified essential hypertension   . Angina   . Shortness of breath on exertion     "sometimes"  . Anemia   . ANEMIA, IRON DEFICIENCY 05/08/2009  . History of lower GI bleeding   . H/O hiatal hernia   . Migraines   .  Breast cancer 09/29/11    invasive grade III ductal ca,assoc high grade dcis,ER/PR=neg  . History of radiation therapy 02/08/12-03/25/12    left breast,total 61gy  . Blood transfusion without reported diagnosis     Past Surgical History  Procedure Laterality Date  . Vein ligation and stripping  1980's    Right leg  . Lithotripsy      "4 or 5 times"  . Bunionectomy  1970's    bilateral  . Colonoscopy    . Esophagogastroduodenoscopy  07/15/2005  . Thyroid ultrasound  12/1994 and 12/1995  . Mastectomy w/ nodes partial  09/29/11     left  . Port a cath placement  09/29/11    right chest  . Breast surgery    . Cholecystectomy  1990's  . Abdominal hysterectomy  1970's    with BSO  . Dilation and curettage of uterus    . Colon surgery      "several surgeries for short bowel syndrome"  . Abdominal adhesion surgery  1980's thru 1990's    "several"  . Kidney stone surgery  1990's    "tried to go up & get it but pushed it further up"  . Portacath placement  09/29/2011    Procedure: INSERTION PORT-A-CATH;  Surgeon: Ernestene Mention, MD;  Location: [facility] OR;  Service: General;  Laterality: N/A;  . Breast biopsy  08/13/11    left breast lower inner quadrant  . Breast lumpectomy w/ needle localization  09/29/11    left  breast=lymph node,excision benign/ ER/PR=neg, her 2 Positive    Family History  Problem Relation Age of Onset  . Esophageal cancer Son     deceased  . Cancer Son     Esophageal Cancer  . Diabetes Mother   . Heart disease Mother   . Colon cancer Paternal Uncle   . Cancer Paternal Uncle     Colon Cancer  . Kidney disease Sister   . Diabetes Father   . Hypertension Father   . Kidney disease Brother   . Kidney disease Brother   . Kidney disease Brother     Social History History  Substance Use Topics  . Smoking status: Former Smoker -- 1.00 packs/day for 10 years    Types: Cigarettes    Quit date: 09/22/1985  . Smokeless tobacco: Never Used  . Alcohol Use: No     Comment: 09/29/11 "used to drink socially years ago"    Allergies  Allergen Reactions  . Aspirin Other (See Comments)    REACTION: Gi Intolerance/ Burning in stomach  . Iodine Itching    Allergic to IVP dye  . Morphine And Related Itching    Current Outpatient Prescriptions  Medication Sig Dispense Refill  . Calcium Carbonate-Vitamin D (CALCIUM-VITAMIN D) 600-200 MG-UNIT CAPS Take 1 capsule by mouth daily.        . calcium gluconate 650 MG tablet Take 1 tablet (650 mg total) by mouth daily.  30 tablet  2  . cyanocobalamin  (,VITAMIN B-12,) 1000 MCG/ML injection Inject 1,000 mcg into the muscle every 30 (thirty) days. Next one due the 26th of this month      . HYDROcodone-acetaminophen (NORCO/VICODIN) 5-325 MG per tablet Take 1 tablet by mouth every 6 (six) hours as needed for pain.  30 tablet  0  . levothyroxine (SYNTHROID, LEVOTHROID) 125 MCG tablet 1 TAB ONCE DAILY  30 tablet  4  . lovastatin (MEVACOR) 20 MG tablet TAKE 1 TABLET EVERY DAY  30 tablet  4  . magnesium chloride (SLOW-MAG) 64 MG TBEC Take 1 tablet by mouth daily.       Marland Kitchen omeprazole (PRILOSEC) 40 MG capsule TAKE 1 CAPSULE (40 MG TOTAL) BY MOUTH DAILY.  30 capsule  6  . oxyCODONE-acetaminophen (PERCOCET/ROXICET) 5-325 MG per tablet Take 1 tablet by mouth every 4 (four) hours as needed for pain.  30 tablet  0  . thiamine 100 MG tablet Take 100 mg by mouth daily.        . trazodone (DESYREL) 300 MG tablet TAKE 1 TABLET (300 MG TOTAL) BY MOUTH AT BEDTIME.  30 tablet  5   Current Facility-Administered Medications  Medication Dose Route Frequency Provider Last Rate Last Dose  . cyanocobalamin ((VITAMIN B-12)) injection 1,000 mcg  1,000 mcg Intramuscular Once [name], MD      . cyanocobalamin ((VITAMIN B-12)) injection 1,000 mcg  1,000 mcg Intramuscular Q30 days [name], MD   1,000 mcg at 11/25/12 1610    Review of Systems Review of Systems  Constitutional: Negative for fever, chills and unexpected weight change.  HENT: Negative for hearing loss, congestion, sore throat, trouble swallowing and voice change.   Eyes: Negative for visual disturbance.  Respiratory: Negative for cough and wheezing.   Cardiovascular: Negative for chest pain, palpitations and leg swelling.  Gastrointestinal: Positive for nausea, abdominal pain and diarrhea. Negative for vomiting, constipation, blood in stool, abdominal distention and anal bleeding.  Genitourinary: Negative for hematuria, vaginal bleeding and difficulty urinating.  Musculoskeletal: Negative for  arthralgias.  Skin: Negative for rash and wound.  Neurological: Negative for seizures, syncope and headaches.  Hematological: Negative for adenopathy. Does not bruise/bleed easily.  Psychiatric/Behavioral: Negative for confusion.    Blood pressure 116/66, pulse 75, temperature 96.9 F (36.1 C), temperature source Temporal, resp. rate 16, height 5\' 4"  (1.626 m), weight 198 lb 3.2 oz (89.903 kg), SpO2 96.00%.  Physical Exam Physical Exam  Constitutional: She is oriented to person, place, and time. She appears well-developed and well-nourished. No distress.  HENT:  Head: Normocephalic and atraumatic.  Nose: Nose normal.  Mouth/Throat: No oropharyngeal exudate.  Eyes: Conjunctivae and EOM are normal. Pupils are equal, round, and reactive to light. Left eye exhibits no discharge. No scleral icterus.  Neck: Neck supple. No JVD present. No tracheal deviation present. No thyromegaly present.  Cardiovascular: Normal rate, regular rhythm, normal heart sounds and intact distal pulses.   No murmur heard. Pulmonary/Chest: Effort normal and breath sounds normal. No respiratory distress. She has no wheezes. She has no rales. She exhibits no tenderness.  Breast exam declined  Abdominal: Soft. Bowel sounds are normal. She exhibits no distension and no mass. There is no tenderness. There is no rebound and no guarding.    Multiple scars. No hernias. Subjective lower abd.  tenderness. No hernia or mass in the abdomen or inguinal areas. Liver and spleen not enlarged. Not distended.  Musculoskeletal: She exhibits no edema and no tenderness.  Lymphadenopathy:    She has no cervical adenopathy.  Neurological: She is alert and oriented to person, place, and time. She exhibits normal muscle tone. Coordination normal.  Skin: Skin is warm. No rash noted. She is not diaphoretic. No erythema. No pallor.  Psychiatric: She has a normal mood and affect. Her behavior is normal. Judgment and thought content normal.     Data Reviewed Recent CT scan. Recent office notes by Dr. Jarold Motto. Extensive all records from hospital in our office.  Assessment    Chronic abdominal pain with  superimposed worsening in the lower midline and left lower quadrant of uncertain etiology. I cannot identify any acute inflammatory, obstructive, neoplastic, or ischemic process require surgical intervention at this time.  Possible short small bowel syndrome.  Possible history bacterial overgrowth syndrome.  She is advised to return to Sheryn Bison for further evaluation and management. She may need to be referred to a university setting for further evaluation.  I think that any attempt at abdominal exploration would be ill advised considering prior descriptions of her adhesions. Abdominal exploration should be attempted only in a life or death situation, in my opinion.     Plan    See Assessment and plan above. Return to see [place] when necessary         Tavaria Mackins M 01/09/2013, 5:46 PM

## 2013-01-09 NOTE — Patient Instructions (Signed)
The reasons for your abdominal pain are not clear. There is no evidence of obstruction, cancer, infection, inflammation, or hernia on your exam or by CT scan.  It looks like most of the colon is intact that you have had several small bowel operations in the past.  I strongly advised against surgical exploration in this situation. I believe that abdominal surgery would do you more harm than  good, following review of your previous operations, which were very difficult.  Dr. [name] advises that you stay hydrated, drink lots of fluids, eat low residue diet  Return to Dr. Sheryn Bison for further evaluation and management. It may become necessary to refer you to one of the university for their opinion.

## 2013-01-11 ENCOUNTER — Telehealth: Payer: Self-pay | Admitting: Gastroenterology

## 2013-01-11 NOTE — Telephone Encounter (Signed)
Pin clinic referral ASAP !!!!

## 2013-01-11 NOTE — Telephone Encounter (Signed)
Pt saw Dr [name] on 01/09/13 to evaluate her abdominal adhesion. Dr Jacinto Halim notes state:  The reasons for your abdominal pain are not clear. There is no evidence of obstruction, cancer, infection, inflammation, or hernia on your exam or by CT scan.  It looks like most of the colon is intact that you have had several small bowel operations in the past.  I strongly advised against surgical exploration in this situation. I believe that abdominal surgery would do you more harm than good, following review of your previous operations, which were very difficult.  Dr. [name] advises that you stay hydrated, drink lots of fluids, eat low residue diet  Return to Dr. Sheryn Bison for further evaluation and management. It may become necessary to refer you to one of the university for their opinion.  Pt is asking for more Vicodin and states she does't know what she should do, she guesses she'll have to live with the pain; she states she does not want to go to a [facility]. OK to order more Vicodin? Thanks.

## 2013-01-13 NOTE — Telephone Encounter (Signed)
Sent referral to [facility] for pt. Informed pt of referral and their ofc should be contacting her; pt stated understanding.

## 2013-01-16 ENCOUNTER — Other Ambulatory Visit: Payer: Self-pay

## 2013-01-16 MED ORDER — LOVASTATIN 20 MG PO TABS: ORAL_TABLET | ORAL | Status: DC

## 2013-01-27 ENCOUNTER — Ambulatory Visit (INDEPENDENT_AMBULATORY_CARE_PROVIDER_SITE_OTHER): Payer: Medicare Other

## 2013-01-27 DIAGNOSIS — E538 Deficiency of other specified B group vitamins: Secondary | ICD-10-CM

## 2013-01-27 MED ORDER — CYANOCOBALAMIN 1000 MCG/ML IJ SOLN
1000.0000 ug | Freq: Once | INTRAMUSCULAR | Status: AC
  Administered 2013-01-27: 1000 ug via INTRAMUSCULAR

## 2013-02-08 ENCOUNTER — Encounter (INDEPENDENT_AMBULATORY_CARE_PROVIDER_SITE_OTHER): Payer: Self-pay | Admitting: General Surgery

## 2013-02-20 ENCOUNTER — Telehealth: Payer: Self-pay | Admitting: Gastroenterology

## 2013-02-20 NOTE — Telephone Encounter (Signed)
Pt still reporting chronic pain to LLQ of her abdomen. Even though we sent a referral to [facility], she states she never heard from them. Refaxed the info to the, I will ask Mike Gip, PA in Dr Norval Gable absence to fill Vicodin for her.

## 2013-02-21 NOTE — Telephone Encounter (Signed)
Amy, I faxed the referral to [facility]; can she have a few Vicodin? Dr Jarold Motto feels she has many adhesions that are causing her pain so he sent her to Dr [name] Dr [name] states:   She is advised to return to Sheryn Bison for further evaluation and management. She may need to be referred to a university setting for further evaluation.  I think that any attempt at abdominal exploration would be ill advised considering prior descriptions of her adhesions. Abdominal exploration should be attempted only in a life or death situation, in my opinion.  Thanks.

## 2013-02-22 NOTE — Telephone Encounter (Signed)
Would offer tramadol 50 mg q 6 hours ,unless she has already been on chronic vicodin.Marland Kitchenthen [facility] with Vicodin 5/325 #40/0 refills

## 2013-02-23 ENCOUNTER — Ambulatory Visit (HOSPITAL_BASED_OUTPATIENT_CLINIC_OR_DEPARTMENT_OTHER): Payer: Medicare Other | Admitting: Adult Health

## 2013-02-23 ENCOUNTER — Other Ambulatory Visit (HOSPITAL_BASED_OUTPATIENT_CLINIC_OR_DEPARTMENT_OTHER): Payer: Medicare Other | Admitting: Lab

## 2013-02-23 ENCOUNTER — Ambulatory Visit (INDEPENDENT_AMBULATORY_CARE_PROVIDER_SITE_OTHER): Payer: Medicare Other

## 2013-02-23 ENCOUNTER — Ambulatory Visit (HOSPITAL_BASED_OUTPATIENT_CLINIC_OR_DEPARTMENT_OTHER): Payer: Medicare Other

## 2013-02-23 ENCOUNTER — Telehealth: Payer: Self-pay | Admitting: Oncology

## 2013-02-23 ENCOUNTER — Other Ambulatory Visit: Payer: Medicare Other | Admitting: Lab

## 2013-02-23 ENCOUNTER — Encounter: Payer: Self-pay | Admitting: Adult Health

## 2013-02-23 ENCOUNTER — Ambulatory Visit: Payer: Medicare Other | Admitting: Oncology

## 2013-02-23 VITALS — BP 107/71 | HR 80 | Temp 97.7°F | Resp 20 | Ht 64.0 in | Wt 197.3 lb

## 2013-02-23 DIAGNOSIS — E538 Deficiency of other specified B group vitamins: Secondary | ICD-10-CM

## 2013-02-23 DIAGNOSIS — K912 Postsurgical malabsorption, not elsewhere classified: Secondary | ICD-10-CM

## 2013-02-23 DIAGNOSIS — C50919 Malignant neoplasm of unspecified site of unspecified female breast: Secondary | ICD-10-CM

## 2013-02-23 DIAGNOSIS — R197 Diarrhea, unspecified: Secondary | ICD-10-CM

## 2013-02-23 DIAGNOSIS — C50911 Malignant neoplasm of unspecified site of right female breast: Secondary | ICD-10-CM

## 2013-02-23 DIAGNOSIS — Z452 Encounter for adjustment and management of vascular access device: Secondary | ICD-10-CM

## 2013-02-23 DIAGNOSIS — C50312 Malignant neoplasm of lower-inner quadrant of left female breast: Secondary | ICD-10-CM

## 2013-02-23 DIAGNOSIS — Z23 Encounter for immunization: Secondary | ICD-10-CM

## 2013-02-23 DIAGNOSIS — C50319 Malignant neoplasm of lower-inner quadrant of unspecified female breast: Secondary | ICD-10-CM

## 2013-02-23 DIAGNOSIS — Z171 Estrogen receptor negative status [ER-]: Secondary | ICD-10-CM

## 2013-02-23 LAB — CBC WITH DIFFERENTIAL/PLATELET
BASO%: 0.3 % (ref 0.0–2.0)
EOS%: 1.3 % (ref 0.0–7.0)
HCT: 28.9 % — ABNORMAL LOW (ref 34.8–46.6)
LYMPH%: 33 % (ref 14.0–49.7)
MCH: 30.7 pg (ref 25.1–34.0)
MCHC: 34 g/dL (ref 31.5–36.0)
MCV: 90.3 fL (ref 79.5–101.0)
MONO#: 0.3 10*3/uL (ref 0.1–0.9)
MONO%: 9.2 % (ref 0.0–14.0)
Platelets: 200 10*3/uL (ref 145–400)
RBC: 3.2 10*6/uL — ABNORMAL LOW (ref 3.70–5.45)
WBC: 3.7 10*3/uL — ABNORMAL LOW (ref 3.9–10.3)

## 2013-02-23 LAB — COMPREHENSIVE METABOLIC PANEL (CC13)
ALT: 11 U/L (ref 0–55)
AST: 21 U/L (ref 5–34)
Alkaline Phosphatase: 61 U/L (ref 40–150)
Calcium: 8.9 mg/dL (ref 8.4–10.4)
Sodium: 144 mEq/L (ref 136–145)
Total Bilirubin: 0.9 mg/dL (ref 0.20–1.20)
Total Protein: 6.7 g/dL (ref 6.4–8.3)

## 2013-02-23 MED ORDER — HEPARIN SOD (PORK) LOCK FLUSH 100 UNIT/ML IV SOLN
500.0000 [IU] | Freq: Once | INTRAVENOUS | Status: AC
  Administered 2013-02-23: 500 [IU] via INTRAVENOUS
  Filled 2013-02-23: qty 5

## 2013-02-23 MED ORDER — HYDROCODONE-ACETAMINOPHEN 5-325 MG PO TABS: 1.0000 | ORAL_TABLET | Freq: Four times a day (QID) | ORAL | Status: DC | PRN

## 2013-02-23 MED ORDER — CYANOCOBALAMIN 1000 MCG/ML IJ SOLN
1000.0000 ug | Freq: Once | INTRAMUSCULAR | Status: AC
  Administered 2013-02-23: 1000 ug via INTRAMUSCULAR

## 2013-02-23 MED ORDER — SODIUM CHLORIDE 0.9 % IJ SOLN
10.0000 mL | INTRAMUSCULAR | Status: DC | PRN
  Administered 2013-02-23: 10 mL via INTRAVENOUS
  Filled 2013-02-23: qty 10

## 2013-02-23 NOTE — Telephone Encounter (Signed)
, °

## 2013-02-23 NOTE — Telephone Encounter (Signed)
Allergy/contraindication came up, but pt has been taking these w/o problems. Amy Esterwood, PA agreed to give pt Vicodin until we hear from the pain clinic. Pt stated understanding.

## 2013-02-23 NOTE — Telephone Encounter (Signed)
Lm with pts husband to call back

## 2013-02-23 NOTE — Progress Notes (Addendum)
OFFICE PROGRESS NOTE  CC  Rachel Belling, MD [address] [place] [address] Dr. 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 ER negative PR negative Her2 Neu positive with Ki -67 53%,   2. S/P porta cath placement for chemotherapy  #3 patient has begun her adjuvant chemotherapy consisting of Taxotere carboplatinum and Herceptin. Her treatment began in May 2013. A total of 4 cycles of Taxotere carboplatinum and Herceptin combination are planned. Once she completes this she will then proceed to radiation therapy with concomitant Herceptin to be given every 3 weeks to finish out a year of treatment.  #4 patient received 4 cycles of Taxotere carboplatinum and Herceptin between 10/30/2011 to 11/2011.  #5 Radiation therapy from 02/08/12 to 03/25/12.  #6 she then began Herceptin every 3 weeks starting on 01/20/2012.  This finished on May 22.2014.    CURRENT THERAPY: Observation  INTERVAL HISTORY: [name] 67 y.o. female returns for follow up today for evaluation of her breast cancer.  She has had 10 pounds of unintentional weight loss.  She has short bowel syndrome and was seen by Dr. Jarold [name] with [name] who ran tests and she f/u with Dr. Derrell [name] who did not recommend surgical intervention for the adhesions at this time.  She is c/o difficulty sleeping she wakes up multiple times during the night, she does not have a difficult time falling asleep.  She is not getting any current exercise.  Otherwise, she denies fevers, chills, nausea, vomiting, or any further concerns.    MEDICAL HISTORY: Past Medical History  Diagnosis Date  . Obesity   . PUD (peptic ulcer disease)   . Leukopenia   . Abdominal pain   . Short bowel syndrome   .  Internal hemorrhoids without [name] of complication   . Stricture and stenosis of esophagus   . Osteoarthrosis, unspecified whether generalized or localized, unspecified site   . Thyrotoxicosis without [name] of goiter or other cause, without [name] of thyrotoxic crisis or storm   . Other and unspecified hyperlipidemia   . Gout, unspecified   . Diverticulosis of colon (without [name] of hemorrhage)   . C. difficile colitis   . VITAMIN B12 DEFICIENCY 08/30/2009  . GOITER, MULTINODULAR 04/02/2009  . HYPOTHYROIDISM, POST-RADIATION 08/13/2009  . ASYMPTOMATIC POSTMENOPAUSAL STATUS 10/11/2008  . Esophageal reflux 06/12/2008  . PONV (postoperative nausea and vomiting)   . Varicose veins   . Type II or unspecified type diabetes mellitus without [name] of complication, not stated as uncontrolled     no med in years diet controled  . Blood transfusion   . UTI (urinary tract infection)   . Kidney stones     "several"  . Unspecified essential hypertension   . Angina   . Shortness of breath on exertion     "sometimes"  . Anemia   . ANEMIA, IRON DEFICIENCY 05/08/2009  . History of lower GI bleeding   . H/O hiatal hernia   . Migraines   . Breast cancer 09/29/11    invasive grade III ductal ca,assoc high grade dcis,ER/PR=neg  . History of radiation therapy 02/08/12-03/25/12    left breast,total 61gy  . Blood transfusion without reported diagnosis     ALLERGIES:  is allergic to aspirin; iodine; and morphine and  related.  MEDICATIONS:  Current Outpatient Prescriptions  Medication Sig Dispense Refill  . Calcium Carbonate-Vitamin D (CALCIUM-VITAMIN D) 600-200 MG-UNIT CAPS Take 1 capsule by mouth daily.        . calcium gluconate 650 MG tablet Take 1 tablet (650 mg total) by mouth daily.  30 tablet  2  . cyanocobalamin (,VITAMIN B-12,) 1000 MCG/ML injection Inject 1,000 mcg into the muscle every 30 (thirty) days. Next one due the 26th of this month      . HYDROcodone-acetaminophen (NORCO/VICODIN)  5-325 MG per tablet Take 1 tablet by mouth every 6 (six) hours as needed for pain.  40 tablet  0  . levothyroxine (SYNTHROID, LEVOTHROID) 125 MCG tablet 1 TAB ONCE DAILY  30 tablet  4  . lovastatin (MEVACOR) 20 MG tablet TAKE 1 TABLET EVERY DAY  30 tablet  4  . magnesium chloride (SLOW-MAG) 64 MG TBEC Take 1 tablet by mouth daily.       Marland Kitchen omeprazole (PRILOSEC) 40 MG capsule TAKE 1 CAPSULE (40 MG TOTAL) BY MOUTH DAILY.  30 capsule  6  . oxyCODONE-acetaminophen (PERCOCET/ROXICET) 5-325 MG per tablet Take 1 tablet by mouth every 4 (four) hours as needed for pain.  30 tablet  0  . thiamine 100 MG tablet Take 100 mg by mouth daily.        . trazodone (DESYREL) 300 MG tablet TAKE 1 TABLET (300 MG TOTAL) BY MOUTH AT BEDTIME.  30 tablet  5   Current Facility-Administered Medications  Medication Dose Route Frequency Provider Last Rate Last Dose  . cyanocobalamin ((VITAMIN B-12)) injection 1,000 mcg  1,000 mcg Intramuscular Once Rachel Belling, MD      . cyanocobalamin ((VITAMIN B-12)) injection 1,000 mcg  1,000 mcg Intramuscular Q30 days Rachel Belling, MD   1,000 mcg at 11/25/12 5784    SURGICAL HISTORY:  Past Surgical History  Procedure Laterality Date  . Vein ligation and stripping  1980's    Right leg  . Lithotripsy      "4 or 5 times"  . Bunionectomy  1970's    bilateral  . Colonoscopy    . Esophagogastroduodenoscopy  07/15/2005  . Thyroid ultrasound  12/1994 and 12/1995  . Mastectomy w/ nodes partial  09/29/11    left  . Port a cath placement  09/29/11    right chest  . Breast surgery    . Cholecystectomy  1990's  . Abdominal hysterectomy  1970's    with BSO  . Dilation and curettage of uterus    . Colon surgery      "several surgeries for short bowel syndrome"  . Abdominal adhesion surgery  1980's thru 1990's    "several"  . Kidney stone surgery  1990's    "tried to go up & get it but pushed it further up"  . Portacath placement  09/29/2011    Procedure: INSERTION PORT-A-CATH;   Surgeon: Rachel Mention, MD;  Location: [facility] OR;  Service: General;  Laterality: N/A;  . Breast biopsy  08/13/11    left breast lower inner quadrant  . Breast lumpectomy w/ needle localization  09/29/11    left  breast=lymph node,excision benign/ ER/PR=neg, her 2 Positive    REVIEW OF SYSTEMS:   A 10 point review of systems was conducted and is otherwise negative except for what is noted above.    Health Maintenance  Mammogram:  09/30/12 Colonoscopy: 2012 Bone Density Scan: unknown.   Pap Smear: TAH/BSO in 1970 Eye Exam: 2013 Vitamin D Level:  unknown Lipid Panel: 05/2013 scheduled   PHYSICAL EXAMINATION:  BP 107/71  Pulse 80  Temp(Src) 97.7 F (36.5 C) (Oral)  Resp 20  Ht 5\' 4"  (1.626 m)  Wt 197 lb 4.8 oz (89.495 kg)  BMI 33.85 kg/m2 General: Patient is a well appearing female in no acute distress HEENT: PERRLA, sclerae anicteric no conjunctival pallor, MMM Neck: supple, no palpable adenopathy Lungs: clear to auscultation bilaterally, no wheezes, rhonchi, or rales Cardiovascular: regular rate rhythm, S1, S2, no murmurs, rubs or gallops Abdomen: Soft, non-tender, non-distended, normoactive bowel sounds, no HSM Extremities: warm and well perfused, no clubbing, cyanosis, or edema, left arm minor non-pitting edema Skin: No rashes or lesions Neuro: Non-focal Bilateral Breast Exam: left breast healing incision scar, with some tenderness, and radiation skin changes, left breast swelling and erythema has resolved. Right breast no masses or nipple discharge ECOG PERFORMANCE STATUS: 1 - Symptomatic but completely ambulatory  LABORATORY DATA: Lab Results  Component Value Date   WBC 3.7* 02/23/2013   HGB 9.8* 02/23/2013   HCT 28.9* 02/23/2013   MCV 90.3 02/23/2013   PLT 200 02/23/2013      Chemistry      Component Value Date/Time   NA 144 02/23/2013 0943   NA 137 12/26/2012 1616   K 3.5 02/23/2013 0943   K 4.0 12/26/2012 1616   CL 103 12/26/2012 1616   CL 107 10/20/2012 1102   CO2  22 02/23/2013 0943   CO2 28 12/26/2012 1616   BUN 19.3 02/23/2013 0943   BUN 20 12/26/2012 1616   CREATININE 1.0 02/23/2013 0943   CREATININE 1.1 12/26/2012 1616   CREATININE 0.88 03/31/2011 1550      Component Value Date/Time   CALCIUM 8.9 02/23/2013 0943   CALCIUM 9.7 12/26/2012 1616   CALCIUM 9.5 05/15/2010 2153   ALKPHOS 61 02/23/2013 0943   ALKPHOS 105 01/20/2012 1139   AST 21 02/23/2013 0943   AST 24 01/20/2012 1139   ALT 11 02/23/2013 0943   ALT 13 01/20/2012 1139   BILITOT 0.90 02/23/2013 0943   BILITOT 0.8 01/20/2012 1139     ADDITIONAL INFORMATION: 1. PROGNOSTIC INDICATORS - ACIS Results IMMUNOHISTOCHEMICAL AND MORPHOMETRIC ANALYSIS BY THE AUTOMATED CELLULAR IMAGING SYSTEM (ACIS) Estrogen Receptor (Negative, <1%): 0%, NEGATIVE Progesterone Receptor (Negative, <1%): 0%, NEGATIVE COMMENT: The negative hormone receptor study(ies) in this case have an internal positive control. All controls stained appropriately Abigail Miyamoto MD Pathologist, Electronic Signature ( Signed 10/07/2011) FINAL DIAGNOSIS 1 of 4 FINAL for Vlachos, Jannis O ([id number]) Diagnosis 1. Breast, lumpectomy, Left - INVASIVE GRADE III, DUCTAL CARCINOMA, SPANNING 0.7 CM. - ASSOCIATED HIGH GRADED DUCTAL CARCINOMA IN SITU. - LYMPH/VASCULAR INVASION NOT IDENTIFIED. - MARGINS ARE NEGATIVE. - SEE ONCOLOGY TEMPLATE. 2. Lymph node, sentinel, biopsy, Left axillary#1 - ONE BENIGN LYMPH NODE WITH NO TUMOR (0/1). - BENIGN GLANDULAR EPITHELIAL INCLUSIONS PRESENT. - SEE COMMENT. 3. Breast, excision, Posterior margin - BENIGN BREAST PARENCHYMA. - NO ATYPIA, HYPERPLASIA, OR MALIGNANCY IDENTIFIED. 4. Lymph node, sentinel, biopsy, Left axillary #2 - ONE BENIGN LYMPH NODE WITH NO TUMOR SEEN (0/1). Microscopic Comment 1. BREAST, INVASIVE TUMOR, WITH LYMPH NODE SAMPLING Specimen, including laterality: Left breast with posterior margin and sentinel lymph nodes. Procedure: Left breast lumpectomy with posterior margin  excision and sentinel lymph node biopsies. Grade: III. Tubule formation: 3. Nuclear pleomorphism: 3. Mitotic:3. Tumor size (gross measurement and glass slide measurement): 0.7 cm. Margins: Invasive, distance to closest margin: At least 0.8 cm. In-situ, distance to closest margin: At least 0.8 cm.  Lymphovascular invasion: Not identified. Ductal carcinoma in situ: Yes. Grade: High grade. Extensive intraductal component: No. Lobular neoplasia: No. Tumor focality: Unifocal. Treatment effect: N/A. Extent of tumor: Confined to breast parenchyma. Lymph nodes: # examined: 2. Lymph nodes with metastasis: 0. Breast prognostic profile: Performed on previous case ([id number]) Estrogen receptor: 0%, negative. Progesterone receptor: 0%, negative. Her 2 neu: 3.09, amplified. Ki-67: 53%. Non-neoplastic breast: Fat necrosis present. TNM: pT1b, pN0, MX. Comments: An estrogen receptor and progesterone receptor will be repeated on the current tumor and reported in an addendum. (RAH:gt, 10/01/11) 2. The left sentinel axillary lymph node #1 shows benign glandular epithelial inclusions. Some of the inclusions are ciliated. The nuclei are bland appearing and are not malignant. Smooth muscle myosin, p63 and calponin immunohistochemical stains are performed which do not show a myoepithelial layer. 2 of 4 FINAL for [place], ABIGIAL [place] ([id number]) Microscopic Comment(continued) Although this is the case, the inclusions are benign and do not represent metastatic carcinoma. Both Dr. Frederica Kuster and Dr. Colonel Bald have seen the left sentinel axillary lymph node in consultation with agreement that the inclusions are benign and do not represent metastatic carcinoma. Zandra Abts MD Pathologist, Electronic Signature (Case signed 10/02/2011) Specimen Gross and Clinical Information Specimen(s) Obtained: 1. Breast, lumpectomy, Left 2. Lymph node, sentinel, biopsy, Left axillary#1 3. Breast, excision, Posterior  margin 4. Lymph node, sentinel, biopsy, Left axillary #2 Specimen Clinical  RADIOGRAPHIC STUDIES:  ASSESSMENT: 67 year old with   1. 0.7 cm high grade invasive ductal carcinoma that is ER-, PR- Her2Neu +, sentinel node negative (T1bN0) pathologic stage I. Because patient is HER-2 positive she received adjuvant chemotherapy and Herceptin. She underwent 4 cycles of TCH combination and then Herceptin alone to complete out 1 years worth of treatment.  2.  Patient completed radiation therapy from 02/08/12 through 03/25/12.  3.  Patient with short bowel syndrome, followed by Dr. Jarold [name] at [facility].     PLAN:   #1  Doing well.  No sign of recurrence.  We will see the patient back in 6 months.  We discussed survivorship, breast exams, and keeping up with her health maintenance.   #2 Patient will continue to follow with [facility] on short bowel syndrome and diarrhea, likely the cause of patient's weight loss  #3 Patient will return to clinic in 6 months for labs and f/u.    All questions were answered. The patient knows to call the clinic with any problems, questions or concerns. We can certainly see the patient much sooner if necessary.  I spent 25 minutes counseling the patient face to face. The total time spent in the appointment was 30 minutes.  Cherie Ouch Lyn Hollingshead, NP Medical Oncology [facility] Phone: [phone]  ATTENDING'S ATTESTATION:  I personally reviewed patient's chart, examined patient myself, formulated the treatment plan as followed.     Overall she's doing well without any. She does continue to have some diarrhea due to she'll follow.. She has no evidence of recurrent breast cancer. She will be seen back in 6 months time for followup.  Drue Second, MD Medical/Oncology [facility] [phone] (beeper) [phone] (Office)  03/20/2013, 9:22 AM

## 2013-02-23 NOTE — Patient Instructions (Addendum)
Doing well.  No sign of recurrence.  We will see you back in 6 months.  Please call us if you have any questions or concerns.    

## 2013-03-20 ENCOUNTER — Other Ambulatory Visit: Payer: Self-pay | Admitting: Internal Medicine

## 2013-03-21 ENCOUNTER — Telehealth: Payer: Self-pay | Admitting: *Deleted

## 2013-03-21 NOTE — Telephone Encounter (Signed)
Pt with multiple bowel surgeries and adhesions reports being sick for a week; she got so bad today, she had to come home from work. She has had vomiting and diarrhea and Imodium doesn't help. She has 4-5 BMs during the day and usually has to get up during the night for BMs as well. Right now she has terrible nausea and cramping. She wants to know if we can order her something instead of a visit. Please advise. Thanks.

## 2013-03-21 NOTE — Telephone Encounter (Signed)
Informed pt that Dr Jarold Motto would like for her to go to the ER. Pt reports she can't afford it d/t she is getting bills from everywhere. I asked if she has applied for [facility] Assistance and she isn't qualified because of husband's salary was put with her. She is getting bills from the [facility] and everywhere. Told pt I was sorry.

## 2013-03-21 NOTE — Telephone Encounter (Signed)
Go to ER

## 2013-03-24 ENCOUNTER — Ambulatory Visit: Payer: Medicare Other

## 2013-03-29 ENCOUNTER — Ambulatory Visit (INDEPENDENT_AMBULATORY_CARE_PROVIDER_SITE_OTHER): Payer: Medicare Other

## 2013-03-29 DIAGNOSIS — D509 Iron deficiency anemia, unspecified: Secondary | ICD-10-CM

## 2013-03-29 DIAGNOSIS — E538 Deficiency of other specified B group vitamins: Secondary | ICD-10-CM

## 2013-03-29 MED ORDER — CYANOCOBALAMIN 1000 MCG/ML IJ SOLN
1000.0000 ug | Freq: Once | INTRAMUSCULAR | Status: AC
  Administered 2013-03-29: 1000 ug via INTRAMUSCULAR

## 2013-04-06 ENCOUNTER — Encounter (INDEPENDENT_AMBULATORY_CARE_PROVIDER_SITE_OTHER): Payer: Self-pay

## 2013-04-06 ENCOUNTER — Ambulatory Visit (HOSPITAL_BASED_OUTPATIENT_CLINIC_OR_DEPARTMENT_OTHER): Payer: Medicare Other

## 2013-04-06 VITALS — BP 119/81 | HR 73 | Temp 97.6°F | Resp 18

## 2013-04-06 DIAGNOSIS — Z95828 Presence of other vascular implants and grafts: Secondary | ICD-10-CM

## 2013-04-06 DIAGNOSIS — C50919 Malignant neoplasm of unspecified site of unspecified female breast: Secondary | ICD-10-CM

## 2013-04-06 DIAGNOSIS — Z452 Encounter for adjustment and management of vascular access device: Secondary | ICD-10-CM

## 2013-04-06 MED ORDER — HEPARIN SOD (PORK) LOCK FLUSH 100 UNIT/ML IV SOLN
500.0000 [IU] | Freq: Once | INTRAVENOUS | Status: AC
  Administered 2013-04-06: 500 [IU] via INTRAVENOUS
  Filled 2013-04-06: qty 5

## 2013-04-06 MED ORDER — SODIUM CHLORIDE 0.9 % IJ SOLN
10.0000 mL | INTRAMUSCULAR | Status: DC | PRN
  Administered 2013-04-06: 10 mL via INTRAVENOUS
  Filled 2013-04-06: qty 10

## 2013-04-06 NOTE — Patient Instructions (Signed)
Implanted Port Instructions  An implanted port is a central line that has a round shape and is placed under the skin. It is used for long-term IV (intravenous) access for:  · Medicine.  · Fluids.  · Liquid nutrition, such as TPN (total parenteral nutrition).  · Blood samples.  Ports can be placed:  · In the chest area just below the collarbone (this is the most common place.)  · In the arms.  · In the belly (abdomen) area.  · In the legs.  PARTS OF THE PORT  A port has 2 main parts:  · The reservoir. The reservoir is round, disc-shaped, and will be a small, raised area under your skin.  · The reservoir is the part where a needle is inserted (accessed) to either give medicines or to draw blood.  · The catheter. The catheter is a long, slender tube that extends from the reservoir. The catheter is placed into a large vein.  · Medicine that is inserted into the reservoir goes into the catheter and then into the vein.  INSERTION OF THE PORT  · The port is surgically placed in either an operating room or in a procedural area (interventional radiology).  · Medicine may be given to help you relax during the procedure.  · The skin where the port will be inserted is numbed (local anesthetic).  · 1 or 2 small cuts (incisions) will be made in the skin to insert the port.  · The port can be used after it has been inserted.  INCISION SITE CARE  · The incision site may have small adhesive strips on it. This helps keep the incision site closed. Sometimes, no adhesive strips are placed. Instead of adhesive strips, a special kind of surgical glue is used to keep the incision closed.  · If adhesive strips were placed on the incision sites, do not take them off. They will fall off on their own.  · The incision site may be sore for 1 to 2 days. Pain medicine can help.  · Do not get the incision site wet. Bathe or shower as directed by your caregiver.  · The incision site should heal in 5 to 7 days. A small scar may form after the  incision has healed.  ACCESSING THE PORT  Special steps must be taken to access the port:  · Before the port is accessed, a numbing cream can be placed on the skin. This helps numb the skin over the port site.  · A sterile technique is used to access the port.  · The port is accessed with a needle. Only "non-coring" port needles should be used to access the port. Once the port is accessed, a blood return should be checked. This helps ensure the port is in the vein and is not clogged (clotted).  · If your caregiver believes your port should remain accessed, a clear (transparent) bandage will be placed over the needle site. The bandage and needle will need to be changed every week or as directed by your caregiver.  · Keep the bandage covering the needle clean and dry. Do not get it wet. Follow your caregiver's instructions on how to take a shower or bath when the port is accessed.  · If your port does not need to stay accessed, no bandage is needed over the port.  FLUSHING THE PORT  Flushing the port keeps it from getting clogged. How often the port is flushed depends on:  · If a   constant infusion is running. If a constant infusion is running, the port may not need to be flushed.  · If intermittent medicines are given.  · If the port is not being used.  For intermittent medicines:  · The port will need to be flushed:  · After medicines have been given.  · After blood has been drawn.  · As part of routine maintenance.  · A port is normally flushed with:  · Normal saline.  · Heparin.  · Follow your caregiver's advice on how often, how much, and the type of flush to use on your port.  IMPORTANT PORT INFORMATION  · Tell your caregiver if you are allergic to heparin.  · After your port is placed, you will get a manufacturer's information card. The card has information about your port. Keep this card with you at all times.  · There are many types of ports available. Know what kind of port you have.  · In case of an  emergency, it may be helpful to wear a medical alert bracelet. This can help alert health care workers that you have a port.  · The port can stay in for as long as your caregiver believes it is necessary.  · When it is time for the port to come out, surgery will be done to remove it. The surgery will be similar to how the port was put in.  · If you are in the hospital or clinic:  · Your port will be taken care of and flushed by a nurse.  · If you are at home:  · A home health care nurse may give medicines and take care of the port.  · You or a family member can get special training and directions for giving medicine and taking care of the port at home.  SEEK IMMEDIATE MEDICAL CARE IF:   · Your port does not flush or you are unable to get a blood return.  · New drainage or pus is coming from the incision.  · A bad smell is coming from the incision site.  · You develop swelling or increased redness at the incision site.  · You develop increased swelling or pain at the port site.  · You develop swelling or pain in the surrounding skin near the port.  · You have an oral temperature above 102° F (38.9° C), not controlled by medicine.  MAKE SURE YOU:   · Understand these instructions.  · Will watch your condition.  · Will get help right away if you are not doing well or get worse.  Document Released: 05/18/2005 Document Revised: 08/10/2011 Document Reviewed: 08/09/2008  Exit[employer]® Patient Information ©2014 [employer].

## 2013-05-01 ENCOUNTER — Ambulatory Visit: Payer: Medicare Other | Admitting: Endocrinology

## 2013-05-01 ENCOUNTER — Ambulatory Visit: Payer: Medicare Other

## 2013-05-01 DIAGNOSIS — Z0289 Encounter for other administrative examinations: Secondary | ICD-10-CM

## 2013-05-11 ENCOUNTER — Emergency Department (HOSPITAL_COMMUNITY): Payer: Medicare Other

## 2013-05-11 ENCOUNTER — Observation Stay (HOSPITAL_COMMUNITY)
Admission: EM | Admit: 2013-05-11 | Discharge: 2013-05-13 | Disposition: A | Discharge status: Home / Self Care | Payer: Medicare Other | Attending: Internal Medicine | Admitting: Internal Medicine

## 2013-05-11 ENCOUNTER — Encounter (HOSPITAL_COMMUNITY): Payer: Self-pay | Admitting: Emergency Medicine

## 2013-05-11 DIAGNOSIS — E042 Nontoxic multinodular goiter: Secondary | ICD-10-CM

## 2013-05-11 DIAGNOSIS — E119 Type 2 diabetes mellitus without complications: Secondary | ICD-10-CM | POA: Insufficient documentation

## 2013-05-11 DIAGNOSIS — I498 Other specified cardiac arrhythmias: Secondary | ICD-10-CM | POA: Insufficient documentation

## 2013-05-11 DIAGNOSIS — E785 Hyperlipidemia, unspecified: Secondary | ICD-10-CM

## 2013-05-11 DIAGNOSIS — I10 Essential (primary) hypertension: Secondary | ICD-10-CM | POA: Insufficient documentation

## 2013-05-11 DIAGNOSIS — E876 Hypokalemia: Secondary | ICD-10-CM | POA: Diagnosis present

## 2013-05-11 DIAGNOSIS — E538 Deficiency of other specified B group vitamins: Secondary | ICD-10-CM

## 2013-05-11 DIAGNOSIS — R197 Diarrhea, unspecified: Secondary | ICD-10-CM

## 2013-05-11 DIAGNOSIS — I959 Hypotension, unspecified: Secondary | ICD-10-CM

## 2013-05-11 DIAGNOSIS — D509 Iron deficiency anemia, unspecified: Secondary | ICD-10-CM

## 2013-05-11 DIAGNOSIS — K90829 Short bowel syndrome, unspecified: Secondary | ICD-10-CM | POA: Diagnosis present

## 2013-05-11 DIAGNOSIS — K912 Postsurgical malabsorption, not elsewhere classified: Secondary | ICD-10-CM | POA: Diagnosis present

## 2013-05-11 DIAGNOSIS — Z0000 Encounter for general adult medical examination without abnormal findings: Secondary | ICD-10-CM

## 2013-05-11 DIAGNOSIS — E860 Dehydration: Secondary | ICD-10-CM

## 2013-05-11 DIAGNOSIS — Z853 Personal history of malignant neoplasm of breast: Secondary | ICD-10-CM | POA: Insufficient documentation

## 2013-05-11 DIAGNOSIS — R5381 Other malaise: Principal | ICD-10-CM | POA: Insufficient documentation

## 2013-05-11 DIAGNOSIS — Z923 Personal history of irradiation: Secondary | ICD-10-CM | POA: Insufficient documentation

## 2013-05-11 DIAGNOSIS — K902 Blind loop syndrome, not elsewhere classified: Secondary | ICD-10-CM

## 2013-05-11 DIAGNOSIS — D72819 Decreased white blood cell count, unspecified: Secondary | ICD-10-CM

## 2013-05-11 DIAGNOSIS — D649 Anemia, unspecified: Secondary | ICD-10-CM

## 2013-05-11 DIAGNOSIS — R4182 Altered mental status, unspecified: Secondary | ICD-10-CM

## 2013-05-11 DIAGNOSIS — M109 Gout, unspecified: Secondary | ICD-10-CM

## 2013-05-11 DIAGNOSIS — R531 Weakness: Secondary | ICD-10-CM | POA: Diagnosis present

## 2013-05-11 DIAGNOSIS — E890 Postprocedural hypothyroidism: Secondary | ICD-10-CM | POA: Diagnosis present

## 2013-05-11 HISTORY — DX: Hypokalemia: E87.6

## 2013-05-11 HISTORY — DX: Hypomagnesemia: E83.42

## 2013-05-11 LAB — OCCULT BLOOD, POC DEVICE: Fecal Occult Bld: NEGATIVE

## 2013-05-11 LAB — CBC WITH DIFFERENTIAL/PLATELET
Basophils Absolute: 0 10*3/uL (ref 0.0–0.1)
Basophils Relative: 0 % (ref 0–1)
Eosinophils Absolute: 0.1 10*3/uL (ref 0.0–0.7)
Eosinophils Relative: 2 % (ref 0–5)
HCT: 26.3 % — ABNORMAL LOW (ref 36.0–46.0)
Hemoglobin: 8.8 g/dL — ABNORMAL LOW (ref 12.0–15.0)
MCH: 30.7 pg (ref 26.0–34.0)
MCHC: 33.5 g/dL (ref 30.0–36.0)
Monocytes Absolute: 0.3 10*3/uL (ref 0.1–1.0)
Monocytes Relative: 8 % (ref 3–12)
RDW: 13.8 % (ref 11.5–15.5)

## 2013-05-11 LAB — POCT I-STAT, CHEM 8
BUN: 15 mg/dL (ref 6–23)
Calcium, Ion: 1.16 mmol/L (ref 1.13–1.30)
Creatinine, Ser: 1.2 mg/dL — ABNORMAL HIGH (ref 0.50–1.10)
Glucose, Bld: 110 mg/dL — ABNORMAL HIGH (ref 70–99)
Hemoglobin: 9.2 g/dL — ABNORMAL LOW (ref 12.0–15.0)
Potassium: 3.1 mEq/L — ABNORMAL LOW (ref 3.5–5.1)
Sodium: 142 mEq/L (ref 135–145)

## 2013-05-11 LAB — URINALYSIS, ROUTINE W REFLEX MICROSCOPIC
Glucose, UA: NEGATIVE mg/dL
Hgb urine dipstick: NEGATIVE
Leukocytes, UA: NEGATIVE
Protein, ur: NEGATIVE mg/dL
Specific Gravity, Urine: 1.014 (ref 1.005–1.030)
Urobilinogen, UA: 0.2 mg/dL (ref 0.0–1.0)
pH: 6 (ref 5.0–8.0)

## 2013-05-11 LAB — COMPREHENSIVE METABOLIC PANEL
AST: 26 U/L (ref 0–37)
Albumin: 3.3 g/dL — ABNORMAL LOW (ref 3.5–5.2)
Alkaline Phosphatase: 48 U/L (ref 39–117)
BUN: 15 mg/dL (ref 6–23)
Calcium: 8.5 mg/dL (ref 8.4–10.5)
Creatinine, Ser: 1.04 mg/dL (ref 0.50–1.10)
Total Bilirubin: 0.6 mg/dL (ref 0.3–1.2)
Total Protein: 6.4 g/dL (ref 6.0–8.3)

## 2013-05-11 LAB — GLUCOSE, CAPILLARY: Glucose-Capillary: 97 mg/dL (ref 70–99)

## 2013-05-11 LAB — HEMOGLOBIN A1C: Hgb A1c MFr Bld: 5.3 % (ref <5.7)

## 2013-05-11 MED ORDER — SIMVASTATIN 10 MG PO TABS
10.0000 mg | ORAL_TABLET | Freq: Every day | ORAL | Status: DC
  Administered 2013-05-11 – 2013-05-13 (×3): 10 mg via ORAL
  Filled 2013-05-11 (×3): qty 1

## 2013-05-11 MED ORDER — SODIUM CHLORIDE 0.9 % IV BOLUS (SEPSIS)
1000.0000 mL | Freq: Once | INTRAVENOUS | Status: AC
  Administered 2013-05-11: 1000 mL via INTRAVENOUS

## 2013-05-11 MED ORDER — ONDANSETRON HCL 4 MG PO TABS
4.0000 mg | ORAL_TABLET | Freq: Four times a day (QID) | ORAL | Status: DC | PRN
  Administered 2013-05-12 (×2): 4 mg via ORAL
  Filled 2013-05-11 (×2): qty 1

## 2013-05-11 MED ORDER — ACETAMINOPHEN 325 MG PO TABS
650.0000 mg | ORAL_TABLET | Freq: Four times a day (QID) | ORAL | Status: DC | PRN
  Administered 2013-05-11: 650 mg via ORAL
  Filled 2013-05-11: qty 2

## 2013-05-11 MED ORDER — ONDANSETRON HCL 4 MG/2ML IJ SOLN: 4.0000 mg | Freq: Four times a day (QID) | INTRAMUSCULAR | Status: DC | PRN

## 2013-05-11 MED ORDER — MAGNESIUM SULFATE 40 MG/ML IJ SOLN
2.0000 g | Freq: Once | INTRAMUSCULAR | Status: AC
  Administered 2013-05-11: 2 g via INTRAVENOUS
  Filled 2013-05-11: qty 50

## 2013-05-11 MED ORDER — HYDROCODONE-ACETAMINOPHEN 5-325 MG PO TABS
1.0000 | ORAL_TABLET | ORAL | Status: DC | PRN
  Administered 2013-05-11 – 2013-05-13 (×7): 2 via ORAL
  Filled 2013-05-11 (×7): qty 2

## 2013-05-11 MED ORDER — HEPARIN SODIUM (PORCINE) 5000 UNIT/ML IJ SOLN
5000.0000 [IU] | Freq: Three times a day (TID) | INTRAMUSCULAR | Status: DC
  Administered 2013-05-11 – 2013-05-13 (×7): 5000 [IU] via SUBCUTANEOUS
  Filled 2013-05-11 (×9): qty 1

## 2013-05-11 MED ORDER — POTASSIUM CHLORIDE CRYS ER 20 MEQ PO TBCR
40.0000 meq | EXTENDED_RELEASE_TABLET | Freq: Four times a day (QID) | ORAL | Status: AC
  Administered 2013-05-11 (×2): 40 meq via ORAL
  Filled 2013-05-11 (×2): qty 2

## 2013-05-11 MED ORDER — LEVOTHYROXINE SODIUM 125 MCG PO TABS
125.0000 ug | ORAL_TABLET | Freq: Every day | ORAL | Status: DC
  Administered 2013-05-11 – 2013-05-13 (×3): 125 ug via ORAL
  Filled 2013-05-11 (×5): qty 1

## 2013-05-11 MED ORDER — FLUOROMETHOLONE 0.1 % OP SUSP
1.0000 [drp] | Freq: Two times a day (BID) | OPHTHALMIC | Status: DC
  Administered 2013-05-11 – 2013-05-13 (×5): 1 [drp] via OPHTHALMIC
  Filled 2013-05-11: qty 5

## 2013-05-11 MED ORDER — CYANOCOBALAMIN 1000 MCG/ML IJ SOLN: 1000.0000 ug | INTRAMUSCULAR | Status: DC

## 2013-05-11 MED ORDER — SODIUM CHLORIDE 0.9 % IJ SOLN
3.0000 mL | Freq: Two times a day (BID) | INTRAMUSCULAR | Status: DC
  Administered 2013-05-11 – 2013-05-13 (×4): 3 mL via INTRAVENOUS

## 2013-05-11 MED ORDER — SODIUM CHLORIDE 0.9 % IV SOLN: Freq: Once | INTRAVENOUS | Status: DC

## 2013-05-11 MED ORDER — ACETAMINOPHEN 650 MG RE SUPP: 650.0000 mg | Freq: Four times a day (QID) | RECTAL | Status: DC | PRN

## 2013-05-11 MED ORDER — SODIUM CHLORIDE 0.9 % IV SOLN: INTRAVENOUS | Status: DC

## 2013-05-11 MED ORDER — CYANOCOBALAMIN 1000 MCG/ML IJ SOLN: 1000.0000 ug | Freq: Once | INTRAMUSCULAR | Status: DC

## 2013-05-11 MED ORDER — MAGNESIUM CHLORIDE 64 MG PO TBEC
1.0000 | DELAYED_RELEASE_TABLET | Freq: Every day | ORAL | Status: DC
  Administered 2013-05-11 – 2013-05-13 (×3): 64 mg via ORAL
  Filled 2013-05-11 (×3): qty 1

## 2013-05-11 MED ORDER — SPIRONOLACTONE 25 MG PO TABS
25.0000 mg | ORAL_TABLET | Freq: Every day | ORAL | Status: DC
  Filled 2013-05-11: qty 1

## 2013-05-11 MED ORDER — SODIUM CHLORIDE 0.9 % IV SOLN
Freq: Once | INTRAVENOUS | Status: DC
  Administered 2013-05-11: 07:00:00 via INTRAVENOUS

## 2013-05-11 MED ORDER — PANTOPRAZOLE SODIUM 40 MG PO TBEC
40.0000 mg | DELAYED_RELEASE_TABLET | Freq: Every day | ORAL | Status: DC
  Administered 2013-05-11 – 2013-05-12 (×2): 40 mg via ORAL
  Filled 2013-05-11: qty 1

## 2013-05-11 MED ORDER — THIAMINE HCL 100 MG PO TABS
100.0000 mg | ORAL_TABLET | Freq: Every day | ORAL | Status: DC
  Administered 2013-05-11 – 2013-05-13 (×3): 100 mg via ORAL
  Filled 2013-05-11 (×3): qty 1

## 2013-05-11 NOTE — Progress Notes (Signed)
05/11/13 1021 addendum Report called to ED [phone] Patient arrived on the floor: 0952

## 2013-05-11 NOTE — ED Notes (Signed)
Per [name] pt was at home when she woke up with c/o of generalized weakness all over at 4:40am, last seen normal by her husband at 10pm.  Pt did not fall, no LOC.

## 2013-05-11 NOTE — ED Notes (Signed)
Patient transported to CT 

## 2013-05-11 NOTE — ED Provider Notes (Signed)
CSN: [id number]     Arrival date & time 05/11/13  0539 History   First MD Initiated Contact with Patient 05/11/13 [phone]     Chief Complaint  Patient presents with  . Weakness   (Consider location/radiation/quality/duration/timing/severity/associated sxs/prior Treatment) HPI Comments: The patient is a 67 year-old female with a past medical history of invasive ductal breast cancer, short bowel syndrome,  HTN, Hypothyroidism, presenting the Emergency Department with a chief complaint of generalized weakness.  The patient reports waking up to use the restroom and felt weak. Per EMS there was no LOC, but she had bowel incontinence.  She denies new medications.  Reports Epigastric pain, chronic diarrhea.  Denies fever or chills, nausea, vomiting, palpitations. The patient's husband reports she has been compliant with her medication and unaware if she accidentally has taken his blood pressure medication.    The history is provided by the patient, medical records, the EMS personnel and the spouse. No language interpreter was used.    Past Medical History  Diagnosis Date  . Obesity   . PUD (peptic ulcer disease)   . Leukopenia   . Abdominal pain   . Short bowel syndrome   . Internal hemorrhoids without mention of complication   . Stricture and stenosis of esophagus   . Osteoarthrosis, unspecified whether generalized or localized, unspecified site   . Thyrotoxicosis without mention of goiter or other cause, without mention of thyrotoxic crisis or storm   . Other and unspecified hyperlipidemia   . Gout, unspecified   . Diverticulosis of colon (without mention of hemorrhage)   . C. difficile colitis   . VITAMIN B12 DEFICIENCY 08/30/2009  . GOITER, MULTINODULAR 04/02/2009  . HYPOTHYROIDISM, POST-RADIATION 08/13/2009  . ASYMPTOMATIC POSTMENOPAUSAL STATUS 10/11/2008  . Esophageal reflux 06/12/2008  . PONV (postoperative nausea and vomiting)   . Varicose veins   . Type II or unspecified type diabetes  mellitus without mention of complication, not stated as uncontrolled     no med in years diet controled  . Blood transfusion   . UTI (urinary tract infection)   . Kidney stones     "several"  . Unspecified essential hypertension   . Angina   . Shortness of breath on exertion     "sometimes"  . Anemia   . ANEMIA, IRON DEFICIENCY 05/08/2009  . History of lower GI bleeding   . H/O hiatal hernia   . Migraines   . Breast cancer 09/29/11    invasive grade III ductal ca,assoc high grade dcis,ER/PR=neg  . History of radiation therapy 02/08/12-03/25/12    left breast,total 61gy  . Blood transfusion without reported diagnosis    Past Surgical History  Procedure Laterality Date  . Vein ligation and stripping  1980's    Right leg  . Lithotripsy      "4 or 5 times"  . Bunionectomy  1970's    bilateral  . Colonoscopy    . Esophagogastroduodenoscopy  07/15/2005  . Thyroid ultrasound  12/1994 and 12/1995  . Mastectomy w/ nodes partial  09/29/11    left  . Port a cath placement  09/29/11    right chest  . Breast surgery    . Cholecystectomy  1990's  . Abdominal hysterectomy  1970's    with BSO  . Dilation and curettage of uterus    . Colon surgery      "several surgeries for short bowel syndrome"  . Abdominal adhesion surgery  1980's thru 1990's    "several"  . Kidney  stone surgery  1990's    "tried to go up & get it but pushed it further up"  . Portacath placement  09/29/2011    Procedure: INSERTION PORT-A-CATH;  Surgeon: Ernestene Mention, MD;  Location: [facility] OR;  Service: General;  Laterality: N/A;  . Breast biopsy  08/13/11    left breast lower inner quadrant  . Breast lumpectomy w/ needle localization  09/29/11    left  breast=lymph node,excision benign/ ER/PR=neg, her 2 Positive   Family History  Problem Relation Age of Onset  . Esophageal cancer Son     deceased  . Cancer Son     Esophageal Cancer  . Diabetes Mother   . Heart disease Mother   . Colon cancer Paternal Uncle   .  Cancer Paternal Uncle     Colon Cancer  . Kidney disease Sister   . Diabetes Father   . Hypertension Father   . Kidney disease Brother   . Kidney disease Brother   . Kidney disease Brother    History  Substance Use Topics  . Smoking status: Former Smoker -- 1.00 packs/day for 10 years    Types: Cigarettes    Quit date: 09/22/1985  . Smokeless tobacco: Never Used  . Alcohol Use: No     Comment: 09/29/11 "used to drink socially years ago"   OB History   Grav Para Term Preterm Abortions TAB SAB Ect Mult Living                 Review of Systems  Unable to perform ROS: Mental status change    Allergies  Aspirin; Codeine; Iodine; and Morphine and related  Home Medications   Current Outpatient Rx  Name  Route  Sig  Dispense  Refill  . Calcium Carbonate-Vitamin D (CALCIUM-VITAMIN D) 600-200 MG-UNIT CAPS   Oral   Take 1 capsule by mouth daily.           . cyanocobalamin (,VITAMIN B-12,) 1000 MCG/ML injection   Intramuscular   Inject 1,000 mcg into the muscle every 30 (thirty) days. Next one due the 26th of this month         . fluorometholone (FML) 0.1 % ophthalmic suspension   Both Eyes   Place 1 drop into both eyes 2 (two) times daily.         Marland Kitchen levothyroxine (SYNTHROID, LEVOTHROID) 125 MCG tablet   Oral   Take 125 mcg by mouth daily before breakfast.         . lovastatin (MEVACOR) 20 MG tablet   Oral   Take 20 mg by mouth daily at 6 PM.         . magnesium chloride (SLOW-MAG) 64 MG TBEC   Oral   Take 1 tablet by mouth daily.          Marland Kitchen omeprazole (PRILOSEC) 40 MG capsule      TAKE 1 CAPSULE (40 MG TOTAL) BY MOUTH DAILY.   30 capsule   6   . spironolactone (ALDACTONE) 25 MG tablet   Oral   Take 1 tablet by mouth daily.         Marland Kitchen thiamine 100 MG tablet   Oral   Take 100 mg by mouth daily.           Marland Kitchen EXPIRED: calcium gluconate 650 MG tablet   Oral   Take 1 tablet (650 mg total) by mouth daily.   30 tablet   2    BP 104/60  Pulse  61  Temp(Src) 97.6 F (36.4 C) (Oral)  Resp 15  Ht 5\' 5"  (1.651 m)  Wt 196 lb (88.905 kg)  BMI 32.62 kg/m2  SpO2 100% Physical Exam  Nursing note and vitals reviewed. Constitutional: She is oriented to person, place, and time. She appears well-developed.  HENT:  Head: Normocephalic and atraumatic.  Mouth/Throat: Mucous membranes are not pale and dry.  Eyes: Right pupil is round and reactive. Left pupil is round and reactive.  Bilateral Constricted pupils  Neck: Neck supple.  Cardiovascular: Regular rhythm.  Bradycardia present.   Trace LE pitting edema  Pulmonary/Chest: No accessory muscle usage. No respiratory distress. She has decreased breath sounds in the right upper field, the right middle field, the right lower field, the left upper field, the left middle field and the left lower field. She has no wheezes. She has no rales.  Abdominal: There is no rebound and no guarding.  Genitourinary: Rectal exam shows external hemorrhoid. Guaiac negative stool.  Neurological: She is oriented to person, place, and time.  Delayed DTR  Skin: Skin is warm and dry.  Psychiatric: Her affect is blunt. Her speech is delayed.  Apathetic     ED Course  Procedures (including critical care time) Labs Review Labs Reviewed  CBC WITH DIFFERENTIAL - Abnormal; Notable for the following:    WBC 3.1 (*)    RBC 2.87 (*)    Hemoglobin 8.8 (*)    HCT 26.3 (*)    Neutro Abs 1.6 (*)    All other components within normal limits  COMPREHENSIVE METABOLIC PANEL - Abnormal; Notable for the following:    Potassium 3.2 (*)    Glucose, Bld 112 (*)    Albumin 3.3 (*)    GFR calc non Af Amer 54 (*)    GFR calc Af Amer 62 (*)    All other components within normal limits  MAGNESIUM - Abnormal; Notable for the following:    Magnesium 1.0 (*)    All other components within normal limits  POCT I-STAT, CHEM 8 - Abnormal; Notable for the following:    Potassium 3.1 (*)    Creatinine, Ser 1.20 (*)    Glucose,  Bld 110 (*)    Hemoglobin 9.2 (*)    HCT 27.0 (*)    All other components within normal limits  GLUCOSE, CAPILLARY  URINALYSIS, ROUTINE W REFLEX MICROSCOPIC  OCCULT BLOOD X 1 CARD TO LAB, STOOL  TSH  OCCULT BLOOD, POC DEVICE  POCT I-STAT TROPONIN I  CG4 I-STAT (LACTIC ACID)   Imaging Review Dg Chest Portable 1 View  05/11/2013   CLINICAL DATA:  Weakness and shortness of breath.  EXAM: PORTABLE CHEST - 1 VIEW  COMPARISON:  Chest radiograph performed 09/29/2011  FINDINGS: The lungs are hypoexpanded. Vascular crowding and vascular congestion are seen. Mildly increased interstitial markings could reflect minimal interstitial edema. No pleural effusion or pneumothorax is seen.  The cardiomediastinal silhouette is within normal limits. No acute osseous abnormalities are seen. A right-sided chest port is noted ending about the distal SVC. Scattered clips are noted overlying the left breast.  IMPRESSION: Lungs hypoexpanded. Vascular congestion noted. Mildly increased interstitial markings could reflect minimal interstitial edema.   Electronically Signed   By: Roanna Raider M.D.   On: 05/11/2013 06:58    EKG Interpretation    Date/Time:  Thursday May 11 2013 06:20:53 EST Ventricular Rate:  53 PR Interval:  186 QRS Duration: 98 QT Interval:  522 QTC Calculation: 490 R Axis:   -  8 Text Interpretation:  Sinus rhythm Low voltage, precordial leads Borderline prolonged QT interval Confirmed by [facility]  MD, APRIL ([id number]) on 05/11/2013 7:14:29 AM            MDM   1. Altered mental status   2. Hypotension   3. Hypomagnesemia   4. Hypokalemia   5. Anemia   6. Leucopenia    MDM Number of Diagnoses or Management Options Altered mental status: new, needed workup Anemia: established, worsening Hypokalemia: established, worsening Hypomagnesemia: established, worsening Hypotension: new, no workup Leucopenia:  Critical Care Total time providing critical care: 30-74  minutes MDM Reviewed: previous chart, nursing note and vitals Reviewed previous: labs Interpretation: labs, ECG, x-ray and CT scan (hypomagnesmia) Total time providing critical care: 30-74 minutes. This excludes time spent performing separately reportable procedures and services. Consults: admitting MD    Pt presents with decreased mental status hypotensive, bradycardic, patient is apathetic on exam. No blood on DRE. CBC, CMP, hemoccult, EKG, ordered. Discussed patient history and condition with Dr. Nicanor Alcon who suggests rule out:  (history of hypothyroidism) Myxedema-TSH sent, Hypercalemia-CMP, UTI-urine ordered,  other electrolyte abnormalities.  We have also considered Beta blocker overdose. Narcotic overdose. Per med rec pt has just begun taking spironolactone in the past week. Corrected Ca 9.1 BP and Hr have responded to fluids BP 120's/70's. Discussed with hospitalist for admission and further evaluation of the patient.    Clabe Seal, PA-C 05/11/13 [phone]

## 2013-05-11 NOTE — ED Notes (Signed)
Brought Family members coffee and a coke.  Patient is still at CT

## 2013-05-11 NOTE — ED Notes (Signed)
[name] in CT notified patient ready for transport.

## 2013-05-11 NOTE — H&P (Signed)
[facility] History and Physical  [name] JYN:[id number] DOB: Nov 19, 1945 DOA: 05/11/2013  Referring physician: EDP PCP: [name], MD   Chief Complaint:   HPI: [name] is a 67 y.o. female with past medical history of short bowel syndrome, chronic diarrhea and history of breast cancer. Patient came to the hospital because of generalized weakness. Patient was feeling weak for the past several days, today at 3 in the morning she woke up to go to the bathroom and she felt very weak and she had an accident with bowel movement on the floor. Patient also was lethargic, she was brought to the emergency department for further evaluation. In the ED CT scan of the head showed no abnormalities, she had magnesium of 1.0, potassium of 3.1. Patient does have chronic diarrhea from short bowel syndrome, she is also on Aldactone.  Review of Systems:  Constitutional: Positive for generalized weakness Eyes: negative for irritation, redness and visual disturbance Ears, nose, mouth, throat, and face: negative for earaches, epistaxis, nasal congestion and sore throat Respiratory: negative for cough, dyspnea on exertion, sputum and wheezing Cardiovascular: negative for chest pain, dyspnea, lower extremity edema, orthopnea, palpitations and syncope Gastrointestinal: negative for abdominal pain, constipation, diarrhea, melena, nausea and vomiting Genitourinary:negative for dysuria, frequency and hematuria Hematologic/lymphatic: negative for bleeding, easy bruising and lymphadenopathy Musculoskeletal:negative for arthralgias, muscle weakness and stiff joints Neurological: negative for coordination problems, gait problems, headaches and weakness Endocrine: negative for diabetic symptoms including polydipsia, polyuria and weight loss Allergic/Immunologic: negative for anaphylaxis, hay fever and urticaria  Past Medical History  Diagnosis Date  . Obesity   . PUD (peptic ulcer  disease)   . Leukopenia   . Abdominal pain   . Short bowel syndrome   . Internal hemorrhoids without mention of complication   . Stricture and stenosis of esophagus   . Osteoarthrosis, unspecified whether generalized or localized, unspecified site   . Thyrotoxicosis without mention of goiter or other cause, without mention of thyrotoxic crisis or storm   . Other and unspecified hyperlipidemia   . Gout, unspecified   . Diverticulosis of colon (without mention of hemorrhage)   . C. difficile colitis   . VITAMIN B12 DEFICIENCY 08/30/2009  . GOITER, MULTINODULAR 04/02/2009  . HYPOTHYROIDISM, POST-RADIATION 08/13/2009  . ASYMPTOMATIC POSTMENOPAUSAL STATUS 10/11/2008  . Esophageal reflux 06/12/2008  . PONV (postoperative nausea and vomiting)   . Varicose veins   . Type II or unspecified type diabetes mellitus without mention of complication, not stated as uncontrolled     no med in years diet controled  . Blood transfusion   . UTI (urinary tract infection)   . Kidney stones     "several"  . Unspecified essential hypertension   . Angina   . Shortness of breath on exertion     "sometimes"  . Anemia   . ANEMIA, IRON DEFICIENCY 05/08/2009  . History of lower GI bleeding   . H/O hiatal hernia   . Migraines   . Breast cancer 09/29/11    invasive grade III ductal ca,assoc high grade dcis,ER/PR=neg  . History of radiation therapy 02/08/12-03/25/12    left breast,total 61gy  . Blood transfusion without reported diagnosis    Past Surgical History  Procedure Laterality Date  . Vein ligation and stripping  1980's    Right leg  . Lithotripsy      "4 or 5 times"  . Bunionectomy  1970's    bilateral  . Colonoscopy    . Esophagogastroduodenoscopy  07/15/2005  . Thyroid ultrasound  12/1994 and 12/1995  . Mastectomy w/ nodes partial  09/29/11    left  . Port a cath placement  09/29/11    right chest  . Breast surgery    . Cholecystectomy  1990's  . Abdominal hysterectomy  1970's    with BSO  .  Dilation and curettage of uterus    . Colon surgery      "several surgeries for short bowel syndrome"  . Abdominal adhesion surgery  1980's thru 1990's    "several"  . Kidney stone surgery  1990's    "tried to go up & get it but pushed it further up"  . Portacath placement  09/29/2011    Procedure: INSERTION PORT-A-CATH;  Surgeon: Ernestene Mention, MD;  Location: [facility] OR;  Service: General;  Laterality: N/A;  . Breast biopsy  08/13/11    left breast lower inner quadrant  . Breast lumpectomy w/ needle localization  09/29/11    left  breast=lymph node,excision benign/ ER/PR=neg, her 2 Positive   Social History:  reports that she quit smoking about 27 years ago. Her smoking use included Cigarettes. She has a 10 pack-year smoking history. She has never used smokeless tobacco. She reports that she does not drink alcohol or use illicit drugs. Lives at home with her husband.  Allergies  Allergen Reactions  . Aspirin Other (See Comments)    REACTION: Gi Intolerance/ Burning in stomach  . Codeine Itching  . Iodine Itching    Allergic to IVP dye  . Morphine And Related Itching    Family History  Problem Relation Age of Onset  . Esophageal cancer Son     deceased  . Cancer Son     Esophageal Cancer  . Diabetes Mother   . Heart disease Mother   . Colon cancer Paternal Uncle   . Cancer Paternal Uncle     Colon Cancer  . Kidney disease Sister   . Diabetes Father   . Hypertension Father   . Kidney disease Brother   . Kidney disease Brother   . Kidney disease Brother      Prior to Admission medications   Medication Sig Start Date End Date Taking? Authorizing Provider  Calcium Carbonate-Vitamin D (CALCIUM-VITAMIN D) 600-200 MG-UNIT CAPS Take 1 capsule by mouth daily.     Yes Historical Provider, MD  cyanocobalamin (,VITAMIN B-12,) 1000 MCG/ML injection Inject 1,000 mcg into the muscle every 30 (thirty) days. Next one due the 26th of this month   Yes Historical Provider, MD   fluorometholone (FML) 0.1 % ophthalmic suspension Place 1 drop into both eyes 2 (two) times daily. 03/14/13  Yes Historical Provider, MD  levothyroxine (SYNTHROID, LEVOTHROID) 125 MCG tablet Take 125 mcg by mouth daily before breakfast. 09/05/12  Yes [name], MD  lovastatin (MEVACOR) 20 MG tablet Take 20 mg by mouth daily at 6 PM. 01/16/13  Yes [name], MD  magnesium chloride (SLOW-MAG) 64 MG TBEC Take 1 tablet by mouth daily.    Yes Historical Provider, MD  omeprazole (PRILOSEC) 40 MG capsule TAKE 1 CAPSULE (40 MG TOTAL) BY MOUTH DAILY. 08/06/12  Yes Victorino December, MD  spironolactone (ALDACTONE) 25 MG tablet Take 1 tablet by mouth daily. 05/06/13  Yes Historical Provider, MD  thiamine 100 MG tablet Take 100 mg by mouth daily.     Yes Historical Provider, MD  calcium gluconate 650 MG tablet Take 1 tablet (650 mg total) by mouth daily. 01/14/12 01/13/13  Victorino December, MD   Physical Exam: Filed Vitals:   05/11/13 0930  BP: 100/64  Pulse: 63  Temp:   Resp: 10    BP 100/64  Pulse 63  Temp(Src) 97.5 F (36.4 C) (Oral)  Resp 10  Ht 5\' 5"  (1.651 m)  Wt 88.905 kg (196 lb)  BMI 32.62 kg/m2  SpO2 100%  General Appearance:    Alert, oriented, no distress, appears stated age  Head:    Normocephalic, atraumatic  Eyes:    PERRL, EOMI, sclera non-icteric        Nose:   Nares without drainage or epistaxis. Mucosa, turbinates normal  Throat:   Moist mucous membranes. Oropharynx without erythema or exudate.  Neck:   Supple. No carotid bruits.  No thyromegaly.  No lymphadenopathy.   Back:     No CVA tenderness, no spinal tenderness  Lungs:     Clear to auscultation bilaterally, without wheezes, rhonchi or rales  Chest wall:    No tenderness to palpitation  Heart:    Regular rate and rhythm without murmurs, gallops, rubs  Abdomen:     Soft, non-tender, nondistended, normal bowel sounds, no organomegaly  Genitalia:    deferred  Rectal:    deferred  Extremities:   No clubbing,  cyanosis.there is trace pedal edema   Pulses:   2+ and symmetric all extremities  Skin:   Skin color, texture, turgor normal, no rashes or lesions  Lymph nodes:   Cervical, supraclavicular, and axillary nodes normal  Neurologic:   CNII-XII intact. Normal strength, sensation and reflexes      throughout    Labs on Admission:  Basic Metabolic Panel:  Recent Labs Lab 05/11/13 0613 05/11/13 0709  NA 143 142  K 3.2* 3.1*  CL 107 107  CO2 25  --   GLUCOSE 112* 110*  BUN 15 15  CREATININE 1.04 1.20*  CALCIUM 8.5  --   MG 1.0*  --    Liver Function Tests:  Recent Labs Lab 05/11/13 0613  AST 26  ALT 18  ALKPHOS 48  BILITOT 0.6  PROT 6.4  ALBUMIN 3.3*   No results found for this basename: LIPASE, AMYLASE,  in the last 168 hours No results found for this basename: AMMONIA,  in the last 168 hours CBC:  Recent Labs Lab 05/11/13 0613 05/11/13 0709  WBC 3.1*  --   NEUTROABS 1.6*  --   HGB 8.8* 9.2*  HCT 26.3* 27.0*  MCV 91.6  --   PLT 172  --    Cardiac Enzymes: No results found for this basename: CKTOTAL, CKMB, CKMBINDEX, TROPONINI,  in the last 168 hours  BNP (last 3 results) No results found for this basename: PROBNP,  in the last 8760 hours CBG:  Recent Labs Lab 05/11/13 0605  GLUCAP 97    Radiological Exams on Admission: Ct Head Wo Contrast  05/11/2013   CLINICAL DATA:  Headache.  Weakness.  EXAM: CT HEAD WITHOUT CONTRAST  TECHNIQUE: Contiguous axial images were obtained from the base of the skull through the vertex without intravenous contrast.  COMPARISON:  None.  FINDINGS: There is mild cortical atrophy and chronic microvascular ischemic change. No evidence of acute intracranial abnormality including infarct, hemorrhage, mass lesion, mass effect, midline shift or abnormal extra-axial fluid collection is identified. There is no hydrocephalus or pneumocephalus. The calvarium is intact.  IMPRESSION: No acute finding.   Electronically Signed   By: Drusilla Kanner M.D.   On: 05/11/2013 07:52  Dg Chest Portable 1 View  05/11/2013   CLINICAL DATA:  Weakness and shortness of breath.  EXAM: PORTABLE CHEST - 1 VIEW  COMPARISON:  Chest radiograph performed 09/29/2011  FINDINGS: The lungs are hypoexpanded. Vascular crowding and vascular congestion are seen. Mildly increased interstitial markings could reflect minimal interstitial edema. No pleural effusion or pneumothorax is seen.  The cardiomediastinal silhouette is within normal limits. No acute osseous abnormalities are seen. A right-sided chest port is noted ending about the distal SVC. Scattered clips are noted overlying the left breast.  IMPRESSION: Lungs hypoexpanded. Vascular congestion noted. Mildly increased interstitial markings could reflect minimal interstitial edema.   Electronically Signed   By: Roanna Raider M.D.   On: 05/11/2013 06:58    EKG: Independently reviewed.   Assessment/Plan Active Problems:   HYPOTHYROIDISM, POST-RADIATION   DIABETES MELLITUS, TYPE II   Hypomagnesemia   Hypokalemia   Generalized weakness    Generalized weakness -Patient reported to have generalized weakness along with low blood pressure of 90/61 and bradycardia. -Blood pressure responded to small bolus of IV fluids. -Generalized weakness is likely secondary to the metabolic derangement including hypomagnesemia and hypokalemia. -Patient had presyncopal like symptoms with a fall (husband says she eased to the floor and did not fall) -Monitor blood pressure and heart rate, check TSH.  Hypomagnesemia -Likely secondary to the chronic diarrhea from short gut syndrome. -Replete with IV supplements.  Hypokalemia -Likely secondary to chronic diarrhea from short bowel syndrome. -Replete with oral supplements. -Please note that patient is taking Aldactone even though patient developed hypokalemia.  Hypothyroidism -Continue current Synthroid dose, check TSH.  Diabetes mellitus type 2, diet  controlled -Not on any hypoglycemic agents, check hemoglobin A1c.  History of left-sided breast cancer -Stage III invasive ductal carcinoma status post lumpectomy radiation. -No active issues, not on any hormonal therapy.  Code Status: Full code Family Communication: Plan discussed with the patient in the presence of her husband at bedside. Disposition Plan: Observation, potential discharge in a.m.  Time spent: 70 minutes  [facility] Pager [phone]  If 7PM-7AM, please contact night-coverage [email or web address] Password [facility] 05/11/2013, 10:46 AM

## 2013-05-11 NOTE — Progress Notes (Signed)
05/11/13 0952. nsg To unit 6E alert and oriented patient accompanied by NT and husband with IV infusing. Placed telemetry per order.Claims weakness and slid down on the floor before calling EMS per husband. Placed patient on fall precaution. No skin issues noted. Oriented to unit set-up. Continued to monitor.

## 2013-05-11 NOTE — ED Notes (Signed)
Report received, assumed care.  

## 2013-05-12 DIAGNOSIS — E860 Dehydration: Secondary | ICD-10-CM

## 2013-05-12 DIAGNOSIS — R5381 Other malaise: Secondary | ICD-10-CM

## 2013-05-12 DIAGNOSIS — K902 Blind loop syndrome, not elsewhere classified: Secondary | ICD-10-CM

## 2013-05-12 LAB — BASIC METABOLIC PANEL
CO2: 24 mEq/L (ref 19–32)
Chloride: 107 mEq/L (ref 96–112)
Creatinine, Ser: 0.89 mg/dL (ref 0.50–1.10)
Glucose, Bld: 94 mg/dL (ref 70–99)
Sodium: 138 mEq/L (ref 135–145)

## 2013-05-12 LAB — TSH: TSH: 13.621 u[IU]/mL — ABNORMAL HIGH (ref 0.350–4.500)

## 2013-05-12 LAB — CBC
HCT: 26.3 % — ABNORMAL LOW (ref 36.0–46.0)
Hemoglobin: 8.9 g/dL — ABNORMAL LOW (ref 12.0–15.0)
MCHC: 33.8 g/dL (ref 30.0–36.0)
MCV: 92 fL (ref 78.0–100.0)
Platelets: 172 10*3/uL (ref 150–400)
RBC: 2.86 MIL/uL — ABNORMAL LOW (ref 3.87–5.11)
RDW: 14.1 % (ref 11.5–15.5)
WBC: 2.9 10*3/uL — ABNORMAL LOW (ref 4.0–10.5)

## 2013-05-12 LAB — MAGNESIUM: Magnesium: 1.8 mg/dL (ref 1.5–2.5)

## 2013-05-12 NOTE — Progress Notes (Signed)
UR completed 

## 2013-05-12 NOTE — ED Provider Notes (Signed)
Medical screening examination/treatment/procedure(s) were conducted as a shared visit with non-physician practitioner(s) and myself.  I personally evaluated the patient during the encounter. Seen and personally examined EKG Interpretation    Date/Time:  Thursday May 11 2013 06:20:53 EST Ventricular Rate:  53 PR Interval:  186 QRS Duration: 98 QT Interval:  522 QTC Calculation: 490 R Axis:   -8 Text Interpretation:  Sinus rhythm Low voltage, precordial leads Borderline prolonged QT interval Confirmed by [facility]  MD, Champ Keetch ([id number]) on 05/11/2013 7:14:29 AM           NCAT PERRL Moist mucus membranes  Brady RR, nl s1s2 CTAB NABS soft no guarding Delayed dtrs  Flat affect  DDx:   1. Hypothyroidism 2. Beta bloacker overdose 3. Considered hypercalcemia, ca 9.5 corrected  Will need admission  Harlow Carrizales K Tayonna Bacha-Rasch, MD 05/13/13 [phone]

## 2013-05-12 NOTE — Evaluation (Signed)
Physical Therapy Evaluation Patient Details Name: [name] MRN: [id number] DOB: 1946/02/09 Today's Date: 05/12/2013 Time: 0922-0935 PT Time Calculation (min): 13 min  PT Assessment / Plan / Recommendation History of Present Illness  [name] is a 67 y.o. female with past medical history of short bowel syndrome, chronic diarrhea and history of breast cancer. Patient came to the hospital because of generalized weakness. Patient was feeling weak for the past several days, today at 3 in the morning she woke up to go to the bathroom and she felt very weak and she had an accident with bowel movement on the floor. Patient also was lethargic, she was brought to the emergency department for further evaluation.  Clinical Impression  Pt presents with generalized weakness, requiring mod A with gait without device, able to gait with supervision with device.  Will benefit from continued PT services for strengthening and activity tolerance.    PT Assessment  Patient needs continued PT services    Follow Up Recommendations  Home health PT    Does the patient have the potential to tolerate intense rehabilitation      Barriers to Discharge        Equipment Recommendations  None recommended by PT    Recommendations for Other Services OT consult   Frequency Min 3X/week    Precautions / Restrictions Restrictions Weight Bearing Restrictions: No   Pertinent Vitals/Pain No c/o pain. Pt c/o lethargy.      Mobility  Bed Mobility Bed Mobility: Supine to Sit;Sit to Supine Supine to Sit: 4: Min guard;HOB elevated Sit to Supine: 5: Supervision;HOB elevated Details for Bed Mobility Assistance: pt requires increased time due to weakness and fatigue Transfers Transfers: Sit to Stand;Stand to Sit Sit to Stand: 3: Mod assist Stand to Sit: 3: Mod assist Details for Transfer Assistance: pt with decreased balance and strength, requires lifitng and steadying  assist Ambulation/Gait Ambulation/Gait Assistance: 3: Mod assist Ambulation Distance (Feet): 20 Feet Assistive device: None Ambulation/Gait Assistance Details: Mod A without AD, pt with furniture walking. Pt states she does not furniture walk at home.  With HHA pt mod A due to weakness.  Gait then performed with RW, pt able to gait with close supervision wiht RW    Exercises     PT Diagnosis: Difficulty walking;Generalized weakness  PT Problem List: Decreased strength;Decreased mobility;Decreased activity tolerance;Decreased balance PT Treatment Interventions: Gait training;DME instruction;Therapeutic exercise;Balance training;Stair training;Wheelchair mobility training;Functional mobility training;Neuromuscular re-education;Therapeutic activities;Patient/family education;Modalities     PT Goals(Current goals can be found in the care plan section) Acute Rehab PT Goals Patient Stated Goal: feel stronger PT Goal Formulation: With patient Time For Goal Achievement: 05/26/13 Potential to Achieve Goals: Good  Visit Information  Last PT Received On: 05/12/13 Assistance Needed: +1 History of Present Illness: [name] is a 67 y.o. female with past medical history of short bowel syndrome, chronic diarrhea and history of breast cancer. Patient came to the hospital because of generalized weakness. Patient was feeling weak for the past several days, today at 3 in the morning she woke up to go to the bathroom and she felt very weak and she had an accident with bowel movement on the floor. Patient also was lethargic, she was brought to the emergency department for further evaluation.       Prior Functioning  Home Living Family/patient expects to be discharged to:: Private residence Living Arrangements: Spouse/significant other Available Help at Discharge: Family Type of Home: House Home Access: Stairs to enter Entrance  Stairs-Number of Steps: 1 Entrance Stairs-Rails: None Home  Layout: One level Home Equipment: Walker - 2 wheels Prior Function Level of Independence: Independent Communication Communication: No difficulties    Cognition  Cognition Arousal/Alertness: Awake/alert Behavior During Therapy: WFL for tasks assessed/performed Overall Cognitive Status: Within Functional Limits for tasks assessed    Extremity/Trunk Assessment Lower Extremity Assessment Lower Extremity Assessment: Generalized weakness Cervical / Trunk Assessment Cervical / Trunk Assessment: Normal   Balance Static Standing Balance Static Standing - Balance Support: Left upper extremity supported Static Standing - Level of Assistance: 4: Min assist Static Standing - Comment/# of Minutes: pt only able to stand x 1 minute before fatigued  End of Session PT - End of Session Equipment Utilized During Treatment: Gait belt Activity Tolerance: Patient limited by fatigue Patient left: in bed;with call bell/phone within reach Nurse Communication: Mobility status  GP Functional Assessment Tool Used: clinical judgement Functional Limitation: Mobility: Walking and moving around Mobility: Walking and Moving Around Current Status [phone]): At least 20 percent but less than 40 percent impaired, limited or restricted Mobility: Walking and Moving Around Goal Status [phone]): At least 1 percent but less than 20 percent impaired, limited or restricted   [facility] 05/12/2013, 9:44 AM

## 2013-05-12 NOTE — Progress Notes (Signed)
[facility] PROGRESS NOTE  [name]:[id number] DOB: 12-21-1945 DOA: 05/11/2013 PCP: [name], MD Brief HPI; [name] is a 67 y.o. female with past medical history of short bowel syndrome, chronic diarrhea and history of breast cancer. Patient came to the hospital because of generalized weakness. Patient was feeling weak for the past several days, today at 3 in the morning she woke up to go to the bathroom and she felt very weak and she had an accident with bowel movement on the floor. Patient also was lethargic, she was brought to the emergency department for further evaluation.  Assessment/Plan: Generalized weakness  -Patient reported to have generalized weakness along with low blood pressure of 90/61 and bradycardia.  -Blood pressure responded to small bolus of IV fluids.  -Generalized weakness is likely secondary to the metabolic derangement including hypomagnesemia and hypokalemia.  -Patient had presyncopal like symptoms with a fall (husband says she eased to the floor and did not fall)  -Monitor blood pressure and heart rate, tsh 13.61, free t3 and free t4 is pending.  Hypomagnesemia  -Likely secondary to the chronic diarrhea from short gut syndrome.  -Replete with IV supplements.  Hypokalemia  -Likely secondary to chronic diarrhea from short bowel syndrome.  -Replete with oral supplements.  -Please note that patient is taking Aldactone even though patient developed hypokalemia.  Hypothyroidism  -Continue current Synthroid dose, TSH is 13.61 , will check for free t3 and free t4.  Diabetes mellitus type 2, diet controlled  hgba1c is 5.3.  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  -No active issues, not on any hormonal therapy.      Code Status: full code Family Communication: none at bedside, but discussed the  plan of care with the patient.  Disposition Plan: POSSIBLY tomorrow after PT eval.    Consultants:  none  Procedures:  none  Antibiotics:  None    HPI/Subjective: Feeling weak and exhausted.  Objective: Filed Vitals:   05/12/13 1400  BP: 98/60  Pulse: 66  Temp: 98.4 F (36.9 C)  Resp: 18    Intake/Output Summary (Last 24 hours) at 05/12/13 1515 Last data filed at 05/12/13 1300  Gross per 24 hour  Intake 2678.33 ml  Output   1340 ml  Net 1338.33 ml   Filed Weights   05/11/13 0555 05/11/13 1142 05/11/13 2021  Weight: 88.905 kg (196 lb) 87.7 kg (193 lb 5.5 oz) 87.7 kg (193 lb 5.5 oz)    Exam:   General:  ALERT afebrile anxious,   Cardiovascular: s1s2  Respiratory: ctab  Abdomen: soft NT ND BS+  Musculoskeletal: trace  pedal edema.   Data Reviewed: Basic Metabolic Panel:  Recent Labs Lab 05/11/13 0613 05/11/13 0709 05/11/13 1210 05/12/13 0443  NA 143 142  --  138  K 3.2* 3.1*  --  4.4  CL 107 107  --  107  CO2 25  --   --  24  GLUCOSE 112* 110*  --  94  BUN 15 15  --  10  CREATININE 1.04 1.20*  --  0.89  CALCIUM 8.5  --   --  8.6  MG 1.0*  --   --  1.8  PHOS  --   --  3.3  --    Liver Function Tests:  Recent Labs Lab 05/11/13 0613  AST 26  ALT 18  ALKPHOS 48  BILITOT 0.6  PROT 6.4  ALBUMIN 3.3*   No results found for this basename: LIPASE, AMYLASE,  in the last 168 hours No results found for this basename: AMMONIA,  in the last 168 hours CBC:  Recent Labs Lab 05/11/13 0613 05/11/13 0709 05/12/13 0443  WBC 3.1*  --  2.9*  NEUTROABS 1.6*  --   --   HGB 8.8* 9.2* 8.9*  HCT 26.3* 27.0* 26.3*  MCV 91.6  --  92.0  PLT 172  --  172   Cardiac Enzymes: No results found for this basename: CKTOTAL, CKMB, CKMBINDEX, TROPONINI,  in the last 168 hours BNP (last 3 results) No results found for this basename: PROBNP,  in the last 8760 hours CBG:  Recent Labs Lab 05/11/13 0605  GLUCAP 97    No results found for this or any  previous visit (from the past 240 hour(s)).   Studies: Ct Head Wo Contrast  05/11/2013   CLINICAL DATA:  Headache.  Weakness.  EXAM: CT HEAD WITHOUT CONTRAST  TECHNIQUE: Contiguous axial images were obtained from the base of the skull through the vertex without intravenous contrast.  COMPARISON:  None.  FINDINGS: There is mild cortical atrophy and chronic microvascular ischemic change. No evidence of acute intracranial abnormality including infarct, hemorrhage, mass lesion, mass effect, midline shift or abnormal extra-axial fluid collection is identified. There is no hydrocephalus or pneumocephalus. The calvarium is intact.  IMPRESSION: No acute finding.   Electronically Signed   By: Drusilla Kanner M.D.   On: 05/11/2013 07:52   Dg Chest Portable 1 View  05/11/2013   CLINICAL DATA:  Weakness and shortness of breath.  EXAM: PORTABLE CHEST - 1 VIEW  COMPARISON:  Chest radiograph performed 09/29/2011  FINDINGS: The lungs are hypoexpanded. Vascular crowding and vascular congestion are seen. Mildly increased interstitial markings could reflect minimal interstitial edema. No pleural effusion or pneumothorax is seen.  The cardiomediastinal silhouette is within normal limits. No acute osseous abnormalities are seen. A right-sided chest port is noted ending about the distal SVC. Scattered clips are noted overlying the left breast.  IMPRESSION: Lungs hypoexpanded. Vascular congestion noted. Mildly increased interstitial markings could reflect minimal interstitial edema.   Electronically Signed   By: Roanna Raider M.D.   On: 05/11/2013 06:58    Scheduled Meds: . [START ON 05/27/2013] cyanocobalamin  1,000 mcg Intramuscular Q30 days  . fluorometholone  1 drop Both Eyes BID  . heparin  5,000 Units Subcutaneous Q8H  . levothyroxine  125 mcg Oral QAC breakfast  . magnesium chloride  1 tablet Oral Daily  . pantoprazole  40 mg Oral Daily  . simvastatin  10 mg Oral q1800  . sodium chloride  3 mL Intravenous Q12H   . thiamine  100 mg Oral Daily   Continuous Infusions:   Active Problems:   HYPOTHYROIDISM, POST-RADIATION   DIABETES MELLITUS, TYPE II   Hypomagnesemia   Hypokalemia   Generalized weakness    Time spent: 25 MINUTES.     [facility]  [facility] Pager [phone]. If 7PM-7AM, please contact night-coverage at [email or web address], password [facility] 05/12/2013, 3:15 PM  LOS: 1 day

## 2013-05-13 DIAGNOSIS — E876 Hypokalemia: Secondary | ICD-10-CM

## 2013-05-13 DIAGNOSIS — E119 Type 2 diabetes mellitus without complications: Secondary | ICD-10-CM

## 2013-05-13 LAB — MAGNESIUM: Magnesium: 1.5 mg/dL (ref 1.5–2.5)

## 2013-05-13 LAB — BASIC METABOLIC PANEL
BUN: 10 mg/dL (ref 6–23)
CO2: 26 mEq/L (ref 19–32)
Calcium: 9.4 mg/dL (ref 8.4–10.5)
Chloride: 101 mEq/L (ref 96–112)
Glucose, Bld: 91 mg/dL (ref 70–99)
Potassium: 4.2 mEq/L (ref 3.5–5.1)
Sodium: 137 mEq/L (ref 135–145)

## 2013-05-13 LAB — PHOSPHORUS: Phosphorus: 2.9 mg/dL (ref 2.3–4.6)

## 2013-05-13 LAB — T4, FREE: Free T4: 1.04 ng/dL (ref 0.80–1.80)

## 2013-05-13 LAB — T3, FREE: T3, Free: 2.3 pg/mL (ref 2.3–4.2)

## 2013-05-13 NOTE — Progress Notes (Signed)
Utilization Review completed.  

## 2013-05-13 NOTE — Discharge Summary (Signed)
Physician Discharge Summary  [name]:[id number] DOB: Aug 27, 1945 DOA: 05/11/2013  PCP: [name], MD  Admit date: 05/11/2013 Discharge date: 05/13/2013  Time spent: 35 minutes  Recommendations for Outpatient Follow-up:  1. Follow up with PCP ON Monday preferably.  2. Follow up with home health PT/ OT  as recommended.   Discharge Diagnoses:  Active Problems:   HYPOTHYROIDISM, POST-RADIATION   DIABETES MELLITUS, TYPE II   Hypomagnesemia   Hypokalemia   Generalized weakness   Discharge Condition: much improved.   Diet recommendation: regular diet  Filed Weights   05/11/13 1142 05/11/13 2021 05/12/13 2019  Weight: 87.7 kg (193 lb 5.5 oz) 87.7 kg (193 lb 5.5 oz) 88.27 kg (194 lb 9.6 oz)    History of present illness:   [name] is a 67 y.o. female with past medical history of short bowel syndrome, chronic diarrhea and history of breast cancer. Patient came to the hospital because of generalized weakness. Patient was feeling weak for the past several days, today at 3 in the morning she woke up to go to the bathroom and she felt very weak and she had an accident with bowel movement on the floor. Patient also was lethargic, she was brought to the emergency department for further evaluation.  Hospital Course:  Generalized weakness  - resolved after being repleted with potassium and magnesium supplements.  -Patient reported to have generalized weakness along with low blood pressure of 90/61 and bradycardia.  -Blood pressure responded to small bolus of IV fluids.  -Generalized weakness is likely secondary to the metabolic derangement including hypomagnesemia and hypokalemia.  -Patient had presyncopal like symptoms with a fall (husband says she eased to the floor and did not fall)  - tsh 13.61, free t3 and free t4 is pending.  Hypomagnesemia  -Likely secondary to the chronic diarrhea from short gut syndrome.  -Replete with IV supplements. And resolved.   Hypokalemia  -Likely secondary to chronic diarrhea from short bowel syndrome.  -Replete with oral supplements.   Hypothyroidism  -Continue current Synthroid dose, TSH is 13.61 , free t3 t4 are normal.  Diabetes mellitus type 2, diet controlled  hgba1c is 5.3.  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  -No active issues, not on any hormonal therapy.   Hypertension: - BP parameters borderline, hence aldactone stopped. Recommend follow up with PCP regarding this on Monday.    Procedures:  none  Consultations:  none  Discharge Exam: Filed Vitals:   05/13/13 1425  BP: 112/72  Pulse: 80  Temp:   Resp:     General: ALERT afebrile comfortable Cardiovascular: s1s2  Respiratory: ctab  Abdomen: soft NT ND BS+  Musculoskeletal: trace pedal edema.    Discharge Instructions  Discharge Orders   Future Appointments Provider Department Dept Phone   06/02/2013 9:00 AM [facility] Nurse [facility] [phone]   08/03/2013 10:15 AM [facility] [facility] [phone]   08/03/2013 10:45 AM Illa Level, NP [facility] MEDICAL ONCOLOGY [phone]   08/03/2013 11:30 AM [facility] Nurse [facility] [phone]   Future Orders Complete By Expires   Discharge instructions  As directed    Comments:     FOLLOW UP with PCP on Monday. i have stopped spironolactone as  your BP parameters have been borderline.       Medication List    STOP taking these medications       spironolactone 25 MG tablet  Commonly known as:  ALDACTONE      TAKE these medications       calcium gluconate 650 MG tablet  Take 1 tablet (650 mg total) by mouth daily.     Calcium-Vitamin D 600-200 MG-UNIT Caps  Take 1 capsule by mouth daily.      cyanocobalamin 1000 MCG/ML injection  Commonly known as:  (VITAMIN B-12)  Inject 1,000 mcg into the muscle every 30 (thirty) days. Next one due the 26th of this month     fluorometholone 0.1 % ophthalmic suspension  Commonly known as:  FML  Place 1 drop into both eyes 2 (two) times daily.     levothyroxine 125 MCG tablet  Commonly known as:  SYNTHROID, LEVOTHROID  Take 125 mcg by mouth daily before breakfast.     lovastatin 20 MG tablet  Commonly known as:  MEVACOR  Take 20 mg by mouth daily at 6 PM.     omeprazole 40 MG capsule  Commonly known as:  PRILOSEC  TAKE 1 CAPSULE (40 MG TOTAL) BY MOUTH DAILY.     SLOW-MAG 64 MG Tbec SR tablet  Generic drug:  magnesium chloride  Take 1 tablet by mouth daily.     thiamine 100 MG tablet  Take 100 mg by mouth daily.       Allergies  Allergen Reactions  . Aspirin Other (See Comments)    REACTION: Gi Intolerance/ Burning in stomach  . Codeine Itching  . Iodine Itching    Allergic to IVP dye  . Morphine And Related Itching      The results of significant diagnostics from this hospitalization (including imaging, microbiology, ancillary and laboratory) are listed below for reference.    Significant Diagnostic Studies: Ct Head Wo Contrast  05/11/2013   CLINICAL DATA:  Headache.  Weakness.  EXAM: CT HEAD WITHOUT CONTRAST  TECHNIQUE: Contiguous axial images were obtained from the base of the skull through the vertex without intravenous contrast.  COMPARISON:  None.  FINDINGS: There is mild cortical atrophy and chronic microvascular ischemic change. No evidence of acute intracranial abnormality including infarct, hemorrhage, mass lesion, mass effect, midline shift or abnormal extra-axial fluid collection is identified. There is no hydrocephalus or pneumocephalus. The calvarium is intact.  IMPRESSION: No acute finding.   Electronically Signed   By: Drusilla Kanner M.D.   On: 05/11/2013 07:52   Dg Chest Portable 1 View  05/11/2013    CLINICAL DATA:  Weakness and shortness of breath.  EXAM: PORTABLE CHEST - 1 VIEW  COMPARISON:  Chest radiograph performed 09/29/2011  FINDINGS: The lungs are hypoexpanded. Vascular crowding and vascular congestion are seen. Mildly increased interstitial markings could reflect minimal interstitial edema. No pleural effusion or pneumothorax is seen.  The cardiomediastinal silhouette is within normal limits. No acute osseous abnormalities are seen. A right-sided chest port is noted ending about the distal SVC. Scattered clips are noted overlying the left breast.  IMPRESSION: Lungs hypoexpanded. Vascular congestion noted. Mildly increased interstitial markings could reflect minimal interstitial edema.   Electronically Signed   By: Roanna Raider M.D.   On: 05/11/2013 06:58    Microbiology: No results found for this or any previous visit (from the past 240 hour(s)).   Labs: Basic Metabolic Panel:  Recent Labs Lab 05/11/13 [phone] 05/11/13 0709 05/11/13 1210 05/12/13  0443 05/13/13 1110  NA 143 142  --  138 137  K 3.2* 3.1*  --  4.4 4.2  CL 107 107  --  107 101  CO2 25  --   --  24 26  GLUCOSE 112* 110*  --  94 91  BUN 15 15  --  10 10  CREATININE 1.04 1.20*  --  0.89 0.99  CALCIUM 8.5  --   --  8.6 9.4  MG 1.0*  --   --  1.8 1.5  PHOS  --   --  3.3  --  2.9   Liver Function Tests:  Recent Labs Lab 05/11/13 0613  AST 26  ALT 18  ALKPHOS 48  BILITOT 0.6  PROT 6.4  ALBUMIN 3.3*   No results found for this basename: LIPASE, AMYLASE,  in the last 168 hours No results found for this basename: AMMONIA,  in the last 168 hours CBC:  Recent Labs Lab 05/11/13 0613 05/11/13 0709 05/12/13 0443  WBC 3.1*  --  2.9*  NEUTROABS 1.6*  --   --   HGB 8.8* 9.2* 8.9*  HCT 26.3* 27.0* 26.3*  MCV 91.6  --  92.0  PLT 172  --  172   Cardiac Enzymes: No results found for this basename: CKTOTAL, CKMB, CKMBINDEX, TROPONINI,  in the last 168 hours BNP: BNP (last 3 results) No results found for  this basename: PROBNP,  in the last 8760 hours CBG:  Recent Labs Lab 05/11/13 0605  GLUCAP 97       Signed:  Jull Harral  [facility] 05/13/2013, 2:55 PM

## 2013-05-15 NOTE — Care Management Note (Signed)
Late entry:    CARE MANAGEMENT NOTE 05/15/2013  Patient:  [name], [name]   Account Number:  [id number]  Date Initiated:  05/15/2013  Documentation initiated by:  Johny Shock  Subjective/Objective Assessment:   Noted order for HHPT/OT     Action/Plan:   05/15/2013 Call placed to pt home re [facility] orders, pt husband took number and will have pt call CM back. Pt was out at the time of call.  See below   Anticipated DC Date:  05/13/2013   Anticipated DC Plan:  HOME W HOME HEALTH SERVICES         Choice offered to / List presented to:             Status of service:  Completed, signed off Medicare Important Message given?   (If response is "NO", the following Medicare IM given date fields will be blank) Date Medicare IM given:   Date Additional Medicare IM given:    Discharge Disposition:  HOME/SELF CARE  Per UR Regulation:    If discussed at Long Length of Stay Meetings, dates discussed:    Comments:  05/15/13 received return call from pt about 3pm, she states that she has been out shopping and does not feel that she needs [facility] services. Pt would not at this time qualify as home bound. Johny Shock RN MPH, case management,

## 2013-05-16 ENCOUNTER — Ambulatory Visit (INDEPENDENT_AMBULATORY_CARE_PROVIDER_SITE_OTHER): Payer: Medicare Other | Admitting: Endocrinology

## 2013-05-16 ENCOUNTER — Encounter: Payer: Self-pay | Admitting: Endocrinology

## 2013-05-16 DIAGNOSIS — D509 Iron deficiency anemia, unspecified: Secondary | ICD-10-CM

## 2013-05-16 DIAGNOSIS — E876 Hypokalemia: Secondary | ICD-10-CM

## 2013-05-16 DIAGNOSIS — E538 Deficiency of other specified B group vitamins: Secondary | ICD-10-CM

## 2013-05-16 DIAGNOSIS — R197 Diarrhea, unspecified: Secondary | ICD-10-CM

## 2013-05-16 LAB — CBC WITH DIFFERENTIAL/PLATELET
Basophils Relative: 0.2 % (ref 0.0–3.0)
Eosinophils Absolute: 0 10*3/uL (ref 0.0–0.7)
Eosinophils Relative: 0.6 % (ref 0.0–5.0)
Hemoglobin: 10.7 g/dL — ABNORMAL LOW (ref 12.0–15.0)
MCHC: 32.8 g/dL (ref 30.0–36.0)
MCV: 92.9 fl (ref 78.0–100.0)
Monocytes Absolute: 0.5 10*3/uL (ref 0.1–1.0)
Neutro Abs: 3.2 10*3/uL (ref 1.4–7.7)
RBC: 3.52 Mil/uL — ABNORMAL LOW (ref 3.87–5.11)
WBC: 5.1 10*3/uL (ref 4.5–10.5)

## 2013-05-16 LAB — BASIC METABOLIC PANEL
CO2: 24 mEq/L (ref 19–32)
Chloride: 99 mEq/L (ref 96–112)
Creatinine, Ser: 1 mg/dL (ref 0.4–1.2)
Glucose, Bld: 92 mg/dL (ref 70–99)
Potassium: 3.8 mEq/L (ref 3.5–5.1)
Sodium: 136 mEq/L (ref 135–145)

## 2013-05-16 LAB — MAGNESIUM: Magnesium: 1.5 mg/dL (ref 1.5–2.5)

## 2013-05-16 LAB — IBC PANEL
Saturation Ratios: 9.8 % — ABNORMAL LOW (ref 20.0–50.0)
Transferrin: 321.2 mg/dL (ref 212.0–360.0)

## 2013-05-16 MED ORDER — HYDROCODONE-ACETAMINOPHEN 5-325 MG PO TABS: 1.0000 | ORAL_TABLET | ORAL | Status: DC | PRN

## 2013-05-16 MED ORDER — CYANOCOBALAMIN 1000 MCG/ML IJ SOLN
1000.0000 ug | Freq: Once | INTRAMUSCULAR | Status: AC
  Administered 2013-05-16: 1000 ug via INTRAMUSCULAR

## 2013-05-16 NOTE — Progress Notes (Signed)
Subjective:    Patient ID: [name], female    DOB: 09/20/1945, 67 y.o.   MRN: [id number]  HPI Pt states 1 day of moderate pain at the right TMJ area, but no assoc numbness. Overall however, she feels better since her recent hospitalization. She does not take fe tabs.   Past Medical History  Diagnosis Date  . Obesity   . PUD (peptic ulcer disease)   . Leukopenia   . Abdominal pain   . Short bowel syndrome   . Internal hemorrhoids without mention of complication   . Stricture and stenosis of esophagus   . Osteoarthrosis, unspecified whether generalized or localized, unspecified site   . Thyrotoxicosis without mention of goiter or other cause, without mention of thyrotoxic crisis or storm   . Other and unspecified hyperlipidemia   . Gout, unspecified   . Diverticulosis of colon (without mention of hemorrhage)   . C. difficile colitis   . VITAMIN B12 DEFICIENCY 08/30/2009  . GOITER, MULTINODULAR 04/02/2009  . HYPOTHYROIDISM, POST-RADIATION 08/13/2009  . ASYMPTOMATIC POSTMENOPAUSAL STATUS 10/11/2008  . Esophageal reflux 06/12/2008  . PONV (postoperative nausea and vomiting)   . Varicose veins   . Type II or unspecified type diabetes mellitus without mention of complication, not stated as uncontrolled     no med in years diet controled  . Blood transfusion   . UTI (urinary tract infection)   . Kidney stones     "several"  . Unspecified essential hypertension   . Angina   . Shortness of breath on exertion     "sometimes"  . Anemia   . ANEMIA, IRON DEFICIENCY 05/08/2009  . History of lower GI bleeding   . H/O hiatal hernia   . Migraines   . Breast cancer 09/29/11    invasive grade III ductal ca,assoc high grade dcis,ER/PR=neg  . History of radiation therapy 02/08/12-03/25/12    left breast,total 61gy  . Blood transfusion without reported diagnosis   . Hypomagnesemia   . Hypokalemia 05/11/2013    Past Surgical History  Procedure Laterality Date  . Vein ligation and  stripping  1980's    Right leg  . Lithotripsy      "4 or 5 times"  . Bunionectomy  1970's    bilateral  . Colonoscopy    . Esophagogastroduodenoscopy  07/15/2005  . Thyroid ultrasound  12/1994 and 12/1995  . Mastectomy w/ nodes partial  09/29/11    left  . Port a cath placement  09/29/11    right chest  . Breast surgery    . Cholecystectomy  1990's  . Abdominal hysterectomy  1970's    with BSO  . Dilation and curettage of uterus    . Colon surgery      "several surgeries for short bowel syndrome"  . Abdominal adhesion surgery  1980's thru 1990's    "several"  . Kidney stone surgery  1990's    "tried to go up & get it but pushed it further up"  . Portacath placement  09/29/2011    Procedure: INSERTION PORT-A-CATH;  Surgeon: Ernestene Mention, MD;  Location: [facility] OR;  Service: General;  Laterality: N/A;  . Breast biopsy  08/13/11    left breast lower inner quadrant  . Breast lumpectomy w/ needle localization  09/29/11    left  breast=lymph node,excision benign/ ER/PR=neg, her 2 Positive    History   Social History  . Marital Status: Married    Spouse Name: N/A  Number of Children: N/A  . Years of Education: N/A   Occupational History  . Not on file.   Social History Main Topics  . Smoking status: Former Smoker -- 1.00 packs/day for 10 years    Types: Cigarettes    Quit date: 09/22/1985  . Smokeless tobacco: Never Used  . Alcohol Use: No     Comment: 09/29/11 "used to drink socially years ago"  . Drug Use: No  . Sexual Activity: No     Comment: HRT x many yrs   Other Topics Concern  . Not on file   Social History Narrative   Pt gets regular exercise    Current Outpatient Prescriptions on File Prior to Visit  Medication Sig Dispense Refill  . Calcium Carbonate-Vitamin D (CALCIUM-VITAMIN D) 600-200 MG-UNIT CAPS Take 1 capsule by mouth daily.        . cyanocobalamin (,VITAMIN B-12,) 1000 MCG/ML injection Inject 1,000 mcg into the muscle every 30 (thirty) days. Next one  due the 26th of this month      . fluorometholone (FML) 0.1 % ophthalmic suspension Place 1 drop into both eyes 2 (two) times daily.      Marland Kitchen levothyroxine (SYNTHROID, LEVOTHROID) 125 MCG tablet Take 125 mcg by mouth daily before breakfast.      . lovastatin (MEVACOR) 20 MG tablet Take 20 mg by mouth daily at 6 PM.      . magnesium chloride (SLOW-MAG) 64 MG TBEC Take 1 tablet by mouth daily.       Marland Kitchen omeprazole (PRILOSEC) 40 MG capsule TAKE 1 CAPSULE (40 MG TOTAL) BY MOUTH DAILY.  30 capsule  6  . thiamine 100 MG tablet Take 100 mg by mouth daily.        . calcium gluconate 650 MG tablet Take 1 tablet (650 mg total) by mouth daily.  30 tablet  2   No current facility-administered medications on file prior to visit.    Allergies  Allergen Reactions  . Aspirin Other (See Comments)    REACTION: Gi Intolerance/ Burning in stomach  . Codeine Itching  . Iodine Itching    Allergic to IVP dye  . Morphine And Related Itching    Family History  Problem Relation Age of Onset  . Esophageal cancer Son     deceased  . Cancer Son     Esophageal Cancer  . Diabetes Mother   . Heart disease Mother   . Colon cancer Paternal Uncle   . Cancer Paternal Uncle     Colon Cancer  . Kidney disease Sister   . Diabetes Father   . Hypertension Father   . Kidney disease Brother   . Kidney disease Brother   . Kidney disease Brother     BP 118/80  Pulse 93  Temp(Src) 97.3 F (36.3 C) (Oral)  Ht 5\' 5"  (1.651 m)  Wt 194 lb (87.998 kg)  BMI 32.28 kg/m2  SpO2 97%   Review of Systems She has muscle cramps. Diarrhea is improved, but not resolved.      Objective:   Physical Exam VITAL SIGNS:  See vs page GENERAL: no distress head: no deformity eyes: no periorbital swelling, no proptosis external nose and ears are normal mouth: no lesion seen Both eac's and tm's are normal Right TMJ area is slightly tender, but no swollen.    Lab Results  Component Value Date   WBC 5.1 05/16/2013   HGB 10.7*  05/16/2013   HCT 32.7* 05/16/2013   PLT 242.0  05/16/2013   GLUCOSE 92 05/16/2013   CHOL 148 07/01/2012   TRIG 186.0* 07/01/2012   HDL 65.10 07/01/2012   LDLDIRECT 52.1 06/24/2011   LDLCALC 46 07/01/2012   ALT 18 05/11/2013   AST 26 05/11/2013   NA 136 05/16/2013   K 3.8 05/16/2013   CL 99 05/16/2013   CREATININE 1.0 05/16/2013   BUN 15 05/16/2013   CO2 24 05/16/2013   TSH 13.621* 05/11/2013   HGBA1C 5.3 05/11/2013   MICROALBUR 0.6 07/01/2012      Assessment & Plan:  TMJ pain, new, uncertain etiology. Post-i-131 hypothyroidism.  TSH has fluctuated.  Because of recent GI probs, I favor continuing same rx for now. fe-deficiency anemia.  She needs to resume fe rx. Diarrhea, much better.

## 2013-05-16 NOTE — Patient Instructions (Addendum)
Please cll if the pain on your right face persists.  If so, i'll refer you to an ENT specialist. Please resume the spironolactone. blood tests are being requested for you today.  We'll contact you with results. Please continue the same thyroid medication.  We'll recheck that blood test in the future.  Please come back for a regular physical appointment in 6 weeks (after 07/02/13).

## 2013-06-02 ENCOUNTER — Ambulatory Visit (HOSPITAL_BASED_OUTPATIENT_CLINIC_OR_DEPARTMENT_OTHER): Payer: Medicare Other

## 2013-06-02 VITALS — BP 115/77 | HR 69 | Temp 97.0°F

## 2013-06-02 DIAGNOSIS — C50319 Malignant neoplasm of lower-inner quadrant of unspecified female breast: Secondary | ICD-10-CM

## 2013-06-02 DIAGNOSIS — Z95828 Presence of other vascular implants and grafts: Secondary | ICD-10-CM

## 2013-06-02 DIAGNOSIS — Z452 Encounter for adjustment and management of vascular access device: Secondary | ICD-10-CM

## 2013-06-02 MED ORDER — SODIUM CHLORIDE 0.9 % IJ SOLN
10.0000 mL | INTRAMUSCULAR | Status: DC | PRN
  Administered 2013-06-02: 10 mL via INTRAVENOUS
  Filled 2013-06-02: qty 10

## 2013-06-02 MED ORDER — HEPARIN SOD (PORK) LOCK FLUSH 100 UNIT/ML IV SOLN
500.0000 [IU] | Freq: Once | INTRAVENOUS | Status: AC
  Administered 2013-06-02: 500 [IU] via INTRAVENOUS
  Filled 2013-06-02: qty 5

## 2013-06-02 NOTE — Patient Instructions (Signed)
Implanted Port Instructions  An implanted port is a central line that has a round shape and is placed under the skin. It is used for long-term IV (intravenous) access for:  · Medicine.  · Fluids.  · Liquid nutrition, such as TPN (total parenteral nutrition).  · Blood samples.  Ports can be placed:  · In the chest area just below the collarbone (this is the most common place.)  · In the arms.  · In the belly (abdomen) area.  · In the legs.  PARTS OF THE PORT  A port has 2 main parts:  · The reservoir. The reservoir is round, disc-shaped, and will be a small, raised area under your skin.  · The reservoir is the part where a needle is inserted (accessed) to either give medicines or to draw blood.  · The catheter. The catheter is a long, slender tube that extends from the reservoir. The catheter is placed into a large vein.  · Medicine that is inserted into the reservoir goes into the catheter and then into the vein.  INSERTION OF THE PORT  · The port is surgically placed in either an operating room or in a procedural area (interventional radiology).  · Medicine may be given to help you relax during the procedure.  · The skin where the port will be inserted is numbed (local anesthetic).  · 1 or 2 small cuts (incisions) will be made in the skin to insert the port.  · The port can be used after it has been inserted.  INCISION SITE CARE  · The incision site may have small adhesive strips on it. This helps keep the incision site closed. Sometimes, no adhesive strips are placed. Instead of adhesive strips, a special kind of surgical glue is used to keep the incision closed.  · If adhesive strips were placed on the incision sites, do not take them off. They will fall off on their own.  · The incision site may be sore for 1 to 2 days. Pain medicine can help.  · Do not get the incision site wet. Bathe or shower as directed by your caregiver.  · The incision site should heal in 5 to 7 days. A small scar may form after the  incision has healed.  ACCESSING THE PORT  Special steps must be taken to access the port:  · Before the port is accessed, a numbing cream can be placed on the skin. This helps numb the skin over the port site.  · A sterile technique is used to access the port.  · The port is accessed with a needle. Only "non-coring" port needles should be used to access the port. Once the port is accessed, a blood return should be checked. This helps ensure the port is in the vein and is not clogged (clotted).  · If your caregiver believes your port should remain accessed, a clear (transparent) bandage will be placed over the needle site. The bandage and needle will need to be changed every week or as directed by your caregiver.  · Keep the bandage covering the needle clean and dry. Do not get it wet. Follow your caregiver's instructions on how to take a shower or bath when the port is accessed.  · If your port does not need to stay accessed, no bandage is needed over the port.  FLUSHING THE PORT  Flushing the port keeps it from getting clogged. How often the port is flushed depends on:  · If a   constant infusion is running. If a constant infusion is running, the port may not need to be flushed.  · If intermittent medicines are given.  · If the port is not being used.  For intermittent medicines:  · The port will need to be flushed:  · After medicines have been given.  · After blood has been drawn.  · As part of routine maintenance.  · A port is normally flushed with:  · Normal saline.  · Heparin.  · Follow your caregiver's advice on how often, how much, and the type of flush to use on your port.  IMPORTANT PORT INFORMATION  · Tell your caregiver if you are allergic to heparin.  · After your port is placed, you will get a manufacturer's information card. The card has information about your port. Keep this card with you at all times.  · There are many types of ports available. Know what kind of port you have.  · In case of an  emergency, it may be helpful to wear a medical alert bracelet. This can help alert health care workers that you have a port.  · The port can stay in for as long as your caregiver believes it is necessary.  · When it is time for the port to come out, surgery will be done to remove it. The surgery will be similar to how the port was put in.  · If you are in the hospital or clinic:  · Your port will be taken care of and flushed by a nurse.  · If you are at home:  · A home health care nurse may give medicines and take care of the port.  · You or a family member can get special training and directions for giving medicine and taking care of the port at home.  SEEK IMMEDIATE MEDICAL CARE IF:   · Your port does not flush or you are unable to get a blood return.  · New drainage or pus is coming from the incision.  · A bad smell is coming from the incision site.  · You develop swelling or increased redness at the incision site.  · You develop increased swelling or pain at the port site.  · You develop swelling or pain in the surrounding skin near the port.  · You have an oral temperature above 102° F (38.9° C), not controlled by medicine.  MAKE SURE YOU:   · Understand these instructions.  · Will watch your condition.  · Will get help right away if you are not doing well or get worse.  Document Released: 05/18/2005 Document Revised: 08/10/2011 Document Reviewed: 08/09/2008  Exit[employer]® Patient Information ©2014 [employer].

## 2013-06-29 ENCOUNTER — Ambulatory Visit (INDEPENDENT_AMBULATORY_CARE_PROVIDER_SITE_OTHER): Payer: Medicare Other | Admitting: Endocrinology

## 2013-06-29 VITALS — BP 110/70 | HR 77 | Temp 97.7°F | Ht 65.0 in | Wt 196.0 lb

## 2013-06-29 DIAGNOSIS — M549 Dorsalgia, unspecified: Secondary | ICD-10-CM

## 2013-06-29 MED ORDER — METHYLPREDNISOLONE (PAK) 4 MG PO TABS: ORAL_TABLET | ORAL | Status: DC

## 2013-06-29 MED ORDER — HYDROCODONE-ACETAMINOPHEN 5-325 MG PO TABS: 1.0000 | ORAL_TABLET | ORAL | Status: DC | PRN

## 2013-06-29 NOTE — Patient Instructions (Signed)
i have sent a prescription to your pharmacy, for a steroid "pack." Here is a refill of your pain medication. Please come back next week for a regular physical.

## 2013-06-29 NOTE — Progress Notes (Signed)
Subjective:    Patient ID: [name], female    DOB: Dec 28, 1945, 68 y.o.   MRN: [id number]  HPI Pt states few days of moderate pain rad from the right lumbar area to the right lateral thigh and leg.  No assoc numbness. Past Medical History  Diagnosis Date  . Obesity   . PUD (peptic ulcer disease)   . Leukopenia   . Abdominal pain   . Short bowel syndrome   . Internal hemorrhoids without mention of complication   . Stricture and stenosis of esophagus   . Osteoarthrosis, unspecified whether generalized or localized, unspecified site   . Thyrotoxicosis without mention of goiter or other cause, without mention of thyrotoxic crisis or storm   . Other and unspecified hyperlipidemia   . Gout, unspecified   . Diverticulosis of colon (without mention of hemorrhage)   . C. difficile colitis   . VITAMIN B12 DEFICIENCY 08/30/2009  . GOITER, MULTINODULAR 04/02/2009  . HYPOTHYROIDISM, POST-RADIATION 08/13/2009  . ASYMPTOMATIC POSTMENOPAUSAL STATUS 10/11/2008  . Esophageal reflux 06/12/2008  . PONV (postoperative nausea and vomiting)   . Varicose veins   . Type II or unspecified type diabetes mellitus without mention of complication, not stated as uncontrolled     no med in years diet controled  . Blood transfusion   . UTI (urinary tract infection)   . Kidney stones     "several"  . Unspecified essential hypertension   . Angina   . Shortness of breath on exertion     "sometimes"  . Anemia   . ANEMIA, IRON DEFICIENCY 05/08/2009  . History of lower GI bleeding   . H/O hiatal hernia   . Migraines   . Breast cancer 09/29/11    invasive grade III ductal ca,assoc high grade dcis,ER/PR=neg  . History of radiation therapy 02/08/12-03/25/12    left breast,total 59gy  . Blood transfusion without reported diagnosis   . Hypomagnesemia   . Hypokalemia 05/11/2013    Past Surgical History  Procedure Laterality Date  . Vein ligation and stripping  1980's    Right leg  . Lithotripsy     "4 or 5 times"  . Bunionectomy  1970's    bilateral  . Colonoscopy    . Esophagogastroduodenoscopy  07/15/2005  . Thyroid ultrasound  12/1994 and 12/1995  . Mastectomy w/ nodes partial  09/29/11    left  . Port a cath placement  09/29/11    right chest  . Breast surgery    . Cholecystectomy  1990's  . Abdominal hysterectomy  1970's    with BSO  . Dilation and curettage of uterus    . Colon surgery      "several surgeries for short bowel syndrome"  . Abdominal adhesion surgery  1980's thru 1990's    "several"  . Kidney stone surgery  1990's    "tried to go up & get it but pushed it further up"  . Portacath placement  09/29/2011    Procedure: INSERTION PORT-A-CATH;  Surgeon: Adin Hector, MD;  Location: [facility];  Service: General;  Laterality: N/A;  . Breast biopsy  08/13/11    left breast lower inner quadrant  . Breast lumpectomy w/ needle localization  09/29/11    left  breast=lymph node,excision benign/ ER/PR=neg, her 2 Positive    History   Social History  . Marital Status: Married    Spouse Name: N/A    Number of Children: N/A  . Years of Education: N/A  Occupational History  . Not on file.   Social History Main Topics  . Smoking status: Former Smoker -- 1.00 packs/day for 10 years    Types: Cigarettes    Quit date: 09/22/1985  . Smokeless tobacco: Never Used  . Alcohol Use: No     Comment: 09/29/11 "used to drink socially years ago"  . Drug Use: No  . Sexual Activity: No     Comment: HRT x many yrs   Other Topics Concern  . Not on file   Social History Narrative   Pt gets regular exercise    Current Outpatient Prescriptions on File Prior to Visit  Medication Sig Dispense Refill  . Calcium Carbonate-Vitamin D (CALCIUM-VITAMIN D) 600-200 MG-UNIT CAPS Take 1 capsule by mouth daily.        . cyanocobalamin (,VITAMIN B-12,) 1000 MCG/ML injection Inject 1,000 mcg into the muscle every 30 (thirty) days. Next one due the 26th of this month      . fluorometholone  (FML) 0.1 % ophthalmic suspension Place 1 drop into both eyes 2 (two) times daily.      Marland Kitchen levothyroxine (SYNTHROID, LEVOTHROID) 125 MCG tablet Take 125 mcg by mouth daily before breakfast.      . lovastatin (MEVACOR) 20 MG tablet Take 20 mg by mouth daily at 6 PM.      . magnesium chloride (SLOW-MAG) 64 MG TBEC Take 1 tablet by mouth daily.       Marland Kitchen omeprazole (PRILOSEC) 40 MG capsule TAKE 1 CAPSULE (40 MG TOTAL) BY MOUTH DAILY.  30 capsule  6  . spironolactone (ALDACTONE) 25 MG tablet Take 25 mg by mouth daily.      Marland Kitchen thiamine 100 MG tablet Take 100 mg by mouth daily.        . calcium gluconate 650 MG tablet Take 1 tablet (650 mg total) by mouth daily.  30 tablet  2   No current facility-administered medications on file prior to visit.    Allergies  Allergen Reactions  . Aspirin Other (See Comments)    REACTION: Gi Intolerance/ Burning in stomach  . Codeine Itching  . Iodine Itching    Allergic to IVP dye  . Morphine And Related Itching    Family History  Problem Relation Age of Onset  . Esophageal cancer Son     deceased  . Cancer Son     Esophageal Cancer  . Diabetes Mother   . Heart disease Mother   . Colon cancer Paternal Uncle   . Cancer Paternal Uncle     Colon Cancer  . Kidney disease Sister   . Diabetes Father   . Hypertension Father   . Kidney disease Brother   . Kidney disease Brother   . Kidney disease Brother     BP 110/70  Pulse 77  Temp(Src) 97.7 F (36.5 C) (Oral)  Ht 5\' 5"  (1.651 m)  Wt 196 lb (88.905 kg)  BMI 32.62 kg/m2  SpO2 99%   Review of Systems Denies bowel or bladder retention.     Objective:   Physical Exam VITAL SIGNS:  See vs page GENERAL: no distress Spine: nontender Neuro: sensation is intact to touch on the feet Gait: normal and steady.       Assessment & Plan:  Radiculopathy, new, uncertain etiology

## 2013-07-07 ENCOUNTER — Ambulatory Visit: Payer: Medicare Other | Admitting: Endocrinology

## 2013-07-18 ENCOUNTER — Encounter: Payer: Medicare Other | Admitting: Endocrinology

## 2013-07-31 ENCOUNTER — Other Ambulatory Visit: Payer: Self-pay

## 2013-07-31 MED ORDER — LEVOTHYROXINE SODIUM 125 MCG PO TABS: 125.0000 ug | ORAL_TABLET | Freq: Every day | ORAL | Status: DC

## 2013-07-31 MED ORDER — LOVASTATIN 20 MG PO TABS: 20.0000 mg | ORAL_TABLET | Freq: Every day | ORAL | Status: DC

## 2013-08-01 ENCOUNTER — Telehealth: Payer: Self-pay | Admitting: Oncology

## 2013-08-01 NOTE — Telephone Encounter (Signed)
, °

## 2013-08-02 ENCOUNTER — Other Ambulatory Visit: Payer: Self-pay | Admitting: *Deleted

## 2013-08-02 DIAGNOSIS — C50319 Malignant neoplasm of lower-inner quadrant of unspecified female breast: Secondary | ICD-10-CM

## 2013-08-03 ENCOUNTER — Ambulatory Visit: Payer: Medicare Other

## 2013-08-03 ENCOUNTER — Ambulatory Visit (HOSPITAL_BASED_OUTPATIENT_CLINIC_OR_DEPARTMENT_OTHER): Payer: Medicare Other | Admitting: Hematology and Oncology

## 2013-08-03 ENCOUNTER — Encounter: Payer: Self-pay | Admitting: *Deleted

## 2013-08-03 ENCOUNTER — Other Ambulatory Visit (HOSPITAL_BASED_OUTPATIENT_CLINIC_OR_DEPARTMENT_OTHER): Payer: Medicare Other

## 2013-08-03 ENCOUNTER — Telehealth: Payer: Self-pay | Admitting: *Deleted

## 2013-08-03 ENCOUNTER — Ambulatory Visit: Payer: Medicare Other | Admitting: Oncology

## 2013-08-03 ENCOUNTER — Telehealth: Payer: Self-pay | Admitting: Endocrinology

## 2013-08-03 ENCOUNTER — Telehealth: Payer: Self-pay

## 2013-08-03 ENCOUNTER — Other Ambulatory Visit: Payer: Medicare Other

## 2013-08-03 ENCOUNTER — Other Ambulatory Visit: Payer: Self-pay

## 2013-08-03 VITALS — BP 122/75 | HR 73 | Temp 98.0°F | Resp 18 | Ht 65.0 in | Wt 195.7 lb

## 2013-08-03 DIAGNOSIS — C50319 Malignant neoplasm of lower-inner quadrant of unspecified female breast: Secondary | ICD-10-CM

## 2013-08-03 DIAGNOSIS — E538 Deficiency of other specified B group vitamins: Secondary | ICD-10-CM

## 2013-08-03 DIAGNOSIS — D649 Anemia, unspecified: Secondary | ICD-10-CM

## 2013-08-03 DIAGNOSIS — R748 Abnormal levels of other serum enzymes: Secondary | ICD-10-CM

## 2013-08-03 DIAGNOSIS — K5989 Other specified functional intestinal disorders: Secondary | ICD-10-CM

## 2013-08-03 DIAGNOSIS — M549 Dorsalgia, unspecified: Secondary | ICD-10-CM

## 2013-08-03 DIAGNOSIS — E890 Postprocedural hypothyroidism: Secondary | ICD-10-CM

## 2013-08-03 DIAGNOSIS — D72819 Decreased white blood cell count, unspecified: Secondary | ICD-10-CM

## 2013-08-03 DIAGNOSIS — K598 Other specified functional intestinal disorders: Secondary | ICD-10-CM

## 2013-08-03 DIAGNOSIS — Z170 Estrogen receptor positive status [ER+]: Secondary | ICD-10-CM

## 2013-08-03 DIAGNOSIS — Z95828 Presence of other vascular implants and grafts: Secondary | ICD-10-CM

## 2013-08-03 LAB — COMPREHENSIVE METABOLIC PANEL (CC13)
ALBUMIN: 3.5 g/dL (ref 3.5–5.0)
ALT: 29 U/L (ref 0–55)
AST: 38 U/L — ABNORMAL HIGH (ref 5–34)
Alkaline Phosphatase: 55 U/L (ref 40–150)
Anion Gap: 11 mEq/L (ref 3–11)
BUN: 16.8 mg/dL (ref 7.0–26.0)
CALCIUM: 8.1 mg/dL — AB (ref 8.4–10.4)
CHLORIDE: 107 meq/L (ref 98–109)
CO2: 25 mEq/L (ref 22–29)
Creatinine: 1 mg/dL (ref 0.6–1.1)
Glucose: 100 mg/dl (ref 70–140)
POTASSIUM: 3.6 meq/L (ref 3.5–5.1)
Sodium: 144 mEq/L (ref 136–145)
TOTAL PROTEIN: 6.6 g/dL (ref 6.4–8.3)
Total Bilirubin: 0.81 mg/dL (ref 0.20–1.20)

## 2013-08-03 LAB — CBC WITH DIFFERENTIAL/PLATELET
BASO%: 0.3 % (ref 0.0–2.0)
BASOS ABS: 0 10*3/uL (ref 0.0–0.1)
EOS%: 1.1 % (ref 0.0–7.0)
Eosinophils Absolute: 0 10*3/uL (ref 0.0–0.5)
HCT: 27.6 % — ABNORMAL LOW (ref 34.8–46.6)
HGB: 9.3 g/dL — ABNORMAL LOW (ref 11.6–15.9)
LYMPH#: 1.2 10*3/uL (ref 0.9–3.3)
LYMPH%: 36.7 % (ref 14.0–49.7)
MCH: 30.8 pg (ref 25.1–34.0)
MCHC: 33.6 g/dL (ref 31.5–36.0)
MCV: 91.8 fL (ref 79.5–101.0)
MONO#: 0.3 10*3/uL (ref 0.1–0.9)
MONO%: 8.3 % (ref 0.0–14.0)
NEUT#: 1.7 10*3/uL (ref 1.5–6.5)
NEUT%: 53.6 % (ref 38.4–76.8)
Platelets: 174 10*3/uL (ref 145–400)
RBC: 3.01 10*6/uL — ABNORMAL LOW (ref 3.70–5.45)
RDW: 13.8 % (ref 11.2–14.5)
WBC: 3.1 10*3/uL — ABNORMAL LOW (ref 3.9–10.3)

## 2013-08-03 LAB — FERRITIN CHCC: FERRITIN: 249 ng/mL (ref 9–269)

## 2013-08-03 LAB — TSH CHCC: TSH: 41.669 m(IU)/L — ABNORMAL HIGH (ref 0.308–3.960)

## 2013-08-03 MED ORDER — LEVOTHYROXINE SODIUM 150 MCG PO TABS: 150.0000 ug | ORAL_TABLET | Freq: Every day | ORAL | Status: DC

## 2013-08-03 MED ORDER — HEPARIN SOD (PORK) LOCK FLUSH 100 UNIT/ML IV SOLN
500.0000 [IU] | Freq: Once | INTRAVENOUS | Status: AC
  Administered 2013-08-03: 500 [IU] via INTRAVENOUS
  Filled 2013-08-03: qty 5

## 2013-08-03 MED ORDER — SODIUM CHLORIDE 0.9 % IJ SOLN
10.0000 mL | INTRAMUSCULAR | Status: DC | PRN
  Administered 2013-08-03: 10 mL via INTRAVENOUS
  Filled 2013-08-03: qty 10

## 2013-08-03 NOTE — Addendum Note (Signed)
Addended by: Renato Shin on: 08/03/2013 07:27 PM   Modules accepted: Orders

## 2013-08-03 NOTE — Progress Notes (Signed)
Called Dr. Cordelia Pen office at [phone] and spoke with his nurse, informed her that we drew labs on this patient today and her TSH level was high at 41.669 and Dr.Faidas would like him to be aware so he could address it. The nurse informed me that she would send the results to him.

## 2013-08-03 NOTE — Telephone Encounter (Signed)
See below and lab results from 08/03/2013 for TSH and advise if changes in medication should be made. Thanks!

## 2013-08-03 NOTE — [facility] Oncology Navigator Note (Signed)
Patient here for f/u visit.  Patient reports that she is doing very well except that she continues to be anemic and worries that it may be a recurrence of cancer.  Dr. Owens Loffler is ordering additional tests.  She reports that she does feel lightheaded at times.  She continues to experience diarrhea caused by her short bowel syndrome but reports that she is managing and coping with the symptoms.  She denied any additional questions or concerns at this time.  I gave her my contact information again and encouraged her to call me for any needs.

## 2013-08-03 NOTE — Telephone Encounter (Signed)
Spoke with patient and informed her that her TSH level was high at 41.669. Informed her that we contacted her PCP Dr. Loanne Drilling and informed him of the results. Pt verbalized understanding and informed her to call back if she had and questions or concerns.

## 2013-08-03 NOTE — Patient Instructions (Signed)

## 2013-08-03 NOTE — Telephone Encounter (Signed)
i have sent a prescription to your pharmacy, to increase. cpx is due < 30 days.

## 2013-08-03 NOTE — Telephone Encounter (Signed)
pts TSH level was high at 41.669 Dr. Owens Loffler wanted Dr. Loanne Drilling to be aware of this incase changes needed to be made.

## 2013-08-03 NOTE — Telephone Encounter (Signed)
appts made and printed...td 

## 2013-08-04 ENCOUNTER — Telehealth: Payer: Self-pay | Admitting: Endocrinology

## 2013-08-04 NOTE — Telephone Encounter (Signed)
Spoke with pt and scheduled OV

## 2013-08-04 NOTE — Telephone Encounter (Signed)
Requested call back to discuss.  

## 2013-08-04 NOTE — Telephone Encounter (Signed)
Pt informed and scheduled to come in for OV on 3/16.

## 2013-08-04 NOTE — Telephone Encounter (Signed)
Returning megans  call °

## 2013-08-07 LAB — IMMUNOFIXATION ELECTROPHORESIS
IgA: 328 mg/dL (ref 69–380)
IgG (Immunoglobin G), Serum: 1150 mg/dL (ref 690–1700)
IgM, Serum: 105 mg/dL (ref 52–322)
Total Protein, Serum Electrophoresis: 7.2 g/dL (ref 6.0–8.3)

## 2013-08-07 LAB — SEDIMENTATION RATE: SED RATE: 16 mm/h (ref 0–22)

## 2013-08-07 LAB — HEPATITIS C ANTIBODY: HCV AB: NEGATIVE

## 2013-08-07 LAB — VITAMIN B12: Vitamin B-12: 465 pg/mL (ref 211–911)

## 2013-08-07 LAB — ANA: Anti Nuclear Antibody(ANA): NEGATIVE

## 2013-08-07 LAB — CANCER ANTIGEN 27.29: CA 27.29: 30 U/mL (ref 0–39)

## 2013-08-07 LAB — FOLATE: Folate: >20 ng/mL

## 2013-08-09 ENCOUNTER — Telehealth: Payer: Self-pay | Admitting: Oncology

## 2013-08-09 ENCOUNTER — Encounter: Payer: Self-pay | Admitting: Hematology and Oncology

## 2013-08-09 ENCOUNTER — Other Ambulatory Visit: Payer: Self-pay

## 2013-08-09 ENCOUNTER — Other Ambulatory Visit: Payer: Self-pay | Admitting: Hematology and Oncology

## 2013-08-09 ENCOUNTER — Ambulatory Visit (HOSPITAL_BASED_OUTPATIENT_CLINIC_OR_DEPARTMENT_OTHER): Payer: Medicare Other | Admitting: Hematology and Oncology

## 2013-08-09 ENCOUNTER — Other Ambulatory Visit (HOSPITAL_BASED_OUTPATIENT_CLINIC_OR_DEPARTMENT_OTHER): Payer: Medicare Other

## 2013-08-09 ENCOUNTER — Ambulatory Visit (HOSPITAL_BASED_OUTPATIENT_CLINIC_OR_DEPARTMENT_OTHER): Payer: Medicare Other

## 2013-08-09 VITALS — BP 121/80 | HR 71 | Temp 98.4°F | Resp 18 | Ht 65.0 in | Wt 192.6 lb

## 2013-08-09 DIAGNOSIS — R609 Edema, unspecified: Secondary | ICD-10-CM

## 2013-08-09 DIAGNOSIS — C50319 Malignant neoplasm of lower-inner quadrant of unspecified female breast: Secondary | ICD-10-CM

## 2013-08-09 DIAGNOSIS — R531 Weakness: Secondary | ICD-10-CM

## 2013-08-09 DIAGNOSIS — K5989 Other specified functional intestinal disorders: Secondary | ICD-10-CM

## 2013-08-09 DIAGNOSIS — D649 Anemia, unspecified: Secondary | ICD-10-CM

## 2013-08-09 DIAGNOSIS — I4902 Ventricular flutter: Secondary | ICD-10-CM

## 2013-08-09 DIAGNOSIS — Z170 Estrogen receptor positive status [ER+]: Secondary | ICD-10-CM

## 2013-08-09 DIAGNOSIS — R51 Headache: Secondary | ICD-10-CM

## 2013-08-09 DIAGNOSIS — K598 Other specified functional intestinal disorders: Secondary | ICD-10-CM

## 2013-08-09 LAB — CBC & DIFF AND RETIC
BASO%: 0.3 % (ref 0.0–2.0)
BASOS ABS: 0 10*3/uL (ref 0.0–0.1)
EOS%: 1.3 % (ref 0.0–7.0)
Eosinophils Absolute: 0.1 10*3/uL (ref 0.0–0.5)
HEMATOCRIT: 30 % — AB (ref 34.8–46.6)
HEMOGLOBIN: 9.8 g/dL — AB (ref 11.6–15.9)
Immature Retic Fract: 10.2 % — ABNORMAL HIGH (ref 1.60–10.00)
LYMPH%: 33.5 % (ref 14.0–49.7)
MCH: 29.8 pg (ref 25.1–34.0)
MCHC: 32.7 g/dL (ref 31.5–36.0)
MCV: 91.2 fL (ref 79.5–101.0)
MONO#: 0.3 10*3/uL (ref 0.1–0.9)
MONO%: 8.6 % (ref 0.0–14.0)
NEUT%: 56.3 % (ref 38.4–76.8)
NEUTROS ABS: 2.2 10*3/uL (ref 1.5–6.5)
PLATELETS: 143 10*3/uL — AB (ref 145–400)
RBC: 3.29 10*6/uL — ABNORMAL LOW (ref 3.70–5.45)
RDW: 13.9 % (ref 11.2–14.5)
RETIC CT ABS: 61.19 10*3/uL (ref 33.70–90.70)
Retic %: 1.86 % (ref 0.70–2.10)
WBC: 3.9 10*3/uL (ref 3.9–10.3)
lymph#: 1.3 10*3/uL (ref 0.9–3.3)

## 2013-08-09 LAB — LACTATE DEHYDROGENASE (CC13): LDH: 228 U/L (ref 125–245)

## 2013-08-09 LAB — MAGNESIUM (CC13): MAGNESIUM: 0.8 mg/dL — AB (ref 1.5–2.5)

## 2013-08-09 MED ORDER — SODIUM CHLORIDE 0.9 % IV SOLN
Freq: Once | INTRAVENOUS | Status: AC
  Administered 2013-08-09: 12:00:00 via INTRAVENOUS
  Filled 2013-08-09: qty 1000

## 2013-08-09 MED ORDER — ACETAMINOPHEN 500 MG PO TABS: 1000.0000 mg | ORAL_TABLET | Freq: Once | ORAL | Status: DC

## 2013-08-09 NOTE — Telephone Encounter (Signed)
, °

## 2013-08-09 NOTE — Progress Notes (Signed)
[facility]  OFFICE PROGRESS NOTE  CC  Renato Shin, MD [address] [place] [address] Dr. Fanny Skates  DIAGNOSIS: 68 year old female with new diagnosis of stage I ER-/PR-/Her2Neu positive invasive ductal breast cancer  PRIOR THERAPY: 1. 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 ER negative PR negative Her2 Neu positive with Ki -67 53%,   2. S/P porta cath placement for chemotherapy  #3 patient has begun her adjuvant chemotherapy consisting of Taxotere carboplatinum and Herceptin. Her treatment began in May 2013. A total of 4 cycles of Taxotere carboplatinum and Herceptin combination are planned. Once she completes this she will then proceed to radiation therapy with concomitant Herceptin to be given every 3 weeks to finish out a year of treatment.  #4 patient received 4 cycles of Taxotere carboplatinum and Herceptin between 10/30/2011 to 11/2011.  #5 Radiation therapy from 02/08/12 to 03/25/12.  #6 she then began Herceptin every 3 weeks starting on 01/20/2012.  This finished on May 22.2014.    CURRENT THERAPY: Observation  INTERVAL HISTORY: [name] 68 y.o. female returns for follow up today for evaluation of her breast cancer. Her weight has been overall stable.She has short bowel syndrome and was seen previously by Dr. Sharlett Iles with [facility] who did tests and she f/u with Dr. Dalbert Batman who did not recommend surgical intervention for the adhesions at that time. She is not getting any current exercise.Her energy is fair. She reports intermittent dyspnea and feeling tired.She notes mild leg edema at times.She is seing black eye spots lately,she denies visual loss.She feels dizzy denies nausea but headache has been severe this weekend and constant She has stiffness joint hurting and history of arthritis.Intermittent night sweats not severe and weight fluctuating appetite fair.She denies  severe bone pain,denies cough.Reports no signs of blood loss,denies reflux or indigestion. Patient had no recent chest xray.she has seen cardiologist but had no stress test or heart catheterization. She denies fevers, chills, nausea, vomiting She has history of anemia without prior diagnosis  MEDICAL HISTORY: Past Medical History  Diagnosis Date  . Obesity   . PUD (peptic ulcer disease)   . Leukopenia   . Abdominal pain   . Short bowel syndrome   . Internal hemorrhoids without mention of complication   . Stricture and stenosis of esophagus   . Osteoarthrosis, unspecified whether generalized or localized, unspecified site   . Thyrotoxicosis without mention of goiter or other cause, without mention of thyrotoxic crisis or storm   . Other and unspecified hyperlipidemia   . Gout, unspecified   . Diverticulosis of colon (without mention of hemorrhage)   . C. difficile colitis   . VITAMIN B12 DEFICIENCY 08/30/2009  . GOITER, MULTINODULAR 04/02/2009  . HYPOTHYROIDISM, POST-RADIATION 08/13/2009  . ASYMPTOMATIC POSTMENOPAUSAL STATUS 10/11/2008  . Esophageal reflux 06/12/2008  . PONV (postoperative nausea and vomiting)   . Varicose veins   . Type II or unspecified type diabetes mellitus without mention of complication, not stated as uncontrolled     no med in years diet controled  . Blood transfusion   . UTI (urinary tract infection)   . Kidney stones     "several"  . Unspecified essential hypertension   . Angina   . Shortness of breath on exertion     "sometimes"  . Anemia   . ANEMIA, IRON DEFICIENCY 05/08/2009  . History of lower GI bleeding   .  H/O hiatal hernia   . Migraines   . Breast cancer 09/29/11    invasive grade III ductal ca,assoc high grade dcis,ER/PR=neg  . History of radiation therapy 02/08/12-03/25/12    left breast,total 61gy  . Blood transfusion without reported diagnosis   . Hypomagnesemia   . Hypokalemia 05/11/2013    ALLERGIES:  is allergic to aspirin; codeine;  iodine; and morphine and related.  MEDICATIONS:  Current Outpatient Prescriptions  Medication Sig Dispense Refill  . Calcium Carbonate-Vitamin D (CALCIUM-VITAMIN D) 600-200 MG-UNIT CAPS Take 1 capsule by mouth daily.        . cyanocobalamin (,VITAMIN B-12,) 1000 MCG/ML injection Inject 1,000 mcg into the muscle every 30 (thirty) days. Next one due the 26th of this month      . fluorometholone (FML) 0.1 % ophthalmic suspension Place 1 drop into both eyes 2 (two) times daily.      [facility] HYDROcodone-acetaminophen (NORCO/VICODIN) 5-325 MG per tablet Take 1 tablet by mouth every 4 (four) hours as needed for moderate pain.  100 tablet  0  . lovastatin (MEVACOR) 20 MG tablet Take 1 tablet (20 mg total) by mouth daily at 6 PM.  30 tablet  2  . magnesium chloride (SLOW-MAG) 64 MG TBEC Take 1 tablet by mouth daily.       [facility] Kitchen omeprazole (PRILOSEC) 40 MG capsule TAKE 1 CAPSULE (40 MG TOTAL) BY MOUTH DAILY.  30 capsule  6  . spironolactone (ALDACTONE) 25 MG tablet Take 25 mg by mouth daily.      [facility] thiamine 100 MG tablet Take 100 mg by mouth daily.        . calcium gluconate 650 MG tablet Take 1 tablet (650 mg total) by mouth daily.  30 tablet  2  . levothyroxine (SYNTHROID, LEVOTHROID) 150 MCG tablet Take 1 tablet (150 mcg total) by mouth daily before breakfast.  30 tablet  0  . methylPREDNIsolone (MEDROL DOSPACK) 4 MG tablet follow package directions  21 tablet  0   No current facility-administered medications for this visit.    SURGICAL HISTORY:  Past Surgical History  Procedure Laterality Date  . Vein ligation and stripping  1980's    Right leg  . Lithotripsy      "4 or 5 times"  . Bunionectomy  1970's    bilateral  . Colonoscopy    . Esophagogastroduodenoscopy  07/15/2005  . Thyroid ultrasound  12/1994 and 12/1995  . Mastectomy w/ nodes partial  09/29/11    left  . Port a cath placement  09/29/11    right chest  . Breast surgery    . Cholecystectomy  1990's  . Abdominal hysterectomy  1970's     with BSO  . Dilation and curettage of uterus    . Colon surgery      "several surgeries for short bowel syndrome"  . Abdominal adhesion surgery  1980's thru 1990's    "several"  . Kidney stone surgery  1990's    "tried to go up & get it but pushed it further up"  . Portacath placement  09/29/2011    Procedure: INSERTION PORT-A-CATH;  Surgeon: Adin Hector, MD;  Location: [facility];  Service: General;  Laterality: N/A;  . Breast biopsy  08/13/11    left breast lower inner quadrant  . Breast lumpectomy w/ needle localization  09/29/11    left  breast=lymph node,excision benign/ ER/PR=neg, her 2 Positive    REVIEW OF SYSTEMS:   A 10 point review of systems was  conducted and is otherwise negative except for what is noted above.    Health Maintenance  Mammogram:  09/30/12 Colonoscopy: 2012 Bone Density Scan: unknown.   Pap Smear: TAH/BSO in 1970 Eye Exam: 2013 Vitamin D Level:  unknown Lipid Panel: 05/2013 scheduled   PHYSICAL EXAMINATION:  BP 122/75  Pulse 73  Temp(Src) 98 F (36.7 C) (Oral)  Resp 18  Ht 5' 5"  (1.651 m)  Wt 195 lb 11.2 oz (88.769 kg)  BMI 32.57 kg/m2 General: Patient is a well appearing female in no acute distress HEENT: PERRLA, sclerae anicteric  Pale conjuctiva, MMM Neck: supple, no palpable adenopathy Lungs: clear to auscultation bilaterally, no wheezes, rhonchi, or rales Cardiovascular: regular rate rhythm, S1, S2, no murmurs, rubs or gallops Abdomen: Soft, non-tender, non-distended, normoactive bowel sounds, no HSM Extremities: warm  well perfused, no  cyanosis or edema, left arm trace non-pitting edema Skin: No rashes or lesions Neuro: Non-focal Bilateral Breast Exam: left breast healing incision scar, with some tenderness, and radiation skin changes. Right breast no masses or nipple discharge ECOG PERFORMANCE STATUS: 1 - Symptomatic but completely ambulatory  LABORATORY DATA: Lab Results  Component Value Date   WBC 3.1* 08/03/2013   HGB 9.3*  08/03/2013   HCT 27.6* 08/03/2013   MCV 91.8 08/03/2013   PLT 174 08/03/2013      Chemistry      Component Value Date/Time   NA 144 08/03/2013 0816   NA 136 05/16/2013 1135   K 3.6 08/03/2013 0816   K 3.8 05/16/2013 1135   CL 99 05/16/2013 1135   CL 107 10/20/2012 1102   CO2 25 08/03/2013 0816   CO2 24 05/16/2013 1135   BUN 16.8 08/03/2013 0816   BUN 15 05/16/2013 1135   CREATININE 1.0 08/03/2013 0816   CREATININE 1.0 05/16/2013 1135   CREATININE 0.88 03/31/2011 1550      Component Value Date/Time   CALCIUM 8.1* 08/03/2013 0816   CALCIUM 9.9 05/16/2013 1135   CALCIUM 9.5 05/15/2010 2153   ALKPHOS 55 08/03/2013 0816   ALKPHOS 48 05/11/2013 0613   AST 38* 08/03/2013 0816   AST 26 05/11/2013 0613   ALT 29 08/03/2013 0816   ALT 18 05/11/2013 0613   BILITOT 0.81 08/03/2013 0816   BILITOT 0.6 05/11/2013 8469     ADDITIONAL INFORMATION: 1. PROGNOSTIC INDICATORS - ACIS Results IMMUNOHISTOCHEMICAL AND MORPHOMETRIC ANALYSIS BY THE AUTOMATED CELLULAR IMAGING SYSTEM (ACIS) Estrogen Receptor (Negative, <1%): 0%, NEGATIVE Progesterone Receptor (Negative, <1%): 0%, NEGATIVE COMMENT: The negative hormone receptor study(ies) in this case have an internal positive control. All controls stained appropriately Aldona Bar MD Pathologist, Electronic Signature ( Signed 10/07/2011) FINAL DIAGNOSIS 1 of 4 FINAL for Seipp, [place] ([id number]) Diagnosis 1. Breast, lumpectomy, Left - INVASIVE GRADE III, DUCTAL CARCINOMA, SPANNING 0.7 CM. - ASSOCIATED HIGH GRADED DUCTAL CARCINOMA IN SITU. - LYMPH/VASCULAR INVASION NOT IDENTIFIED. - MARGINS ARE NEGATIVE. - SEE ONCOLOGY TEMPLATE. 2. Lymph node, sentinel, biopsy, Left axillary#1 - ONE BENIGN LYMPH NODE WITH NO TUMOR (0/1). - BENIGN GLANDULAR EPITHELIAL INCLUSIONS PRESENT. - SEE COMMENT. 3. Breast, excision, Posterior margin - BENIGN BREAST PARENCHYMA. - NO ATYPIA, HYPERPLASIA, OR MALIGNANCY IDENTIFIED. 4. Lymph node, sentinel, biopsy, Left axillary  #2 - ONE BENIGN LYMPH NODE WITH NO TUMOR SEEN (0/1). Microscopic Comment 1. BREAST, INVASIVE TUMOR, WITH LYMPH NODE SAMPLING Specimen, including laterality: Left breast with posterior margin and sentinel lymph nodes. Procedure: Left breast lumpectomy with posterior margin excision and sentinel lymph node biopsies. Grade: III. Tubule formation: 3.  Nuclear pleomorphism: 3. Mitotic:3. Tumor size (gross measurement and glass slide measurement): 0.7 cm. Margins: Invasive, distance to closest margin: At least 0.8 cm. In-situ, distance to closest margin: At least 0.8 cm. Lymphovascular invasion: Not identified. Ductal carcinoma in situ: Yes. Grade: High grade. Extensive intraductal component: No. Lobular neoplasia: No. Tumor focality: Unifocal. Treatment effect: N/A. Extent of tumor: Confined to breast parenchyma. Lymph nodes: # examined: 2. Lymph nodes with metastasis: 0. Breast prognostic profile: Performed on previous case ([id number]) Estrogen receptor: 0%, negative. Progesterone receptor: 0%, negative. Her 2 neu: 3.09, amplified. Ki-67: 53%. Non-neoplastic breast: Fat necrosis present. TNM: pT1b, pN0, MX. Comments: An estrogen receptor and progesterone receptor will be repeated on the current tumor and reported in an addendum. (RAH:gt, 10/01/11) 2. The left sentinel axillary lymph node #1 shows benign glandular epithelial inclusions. Some of the inclusions are ciliated. The nuclei are bland appearing and are not malignant. Smooth muscle myosin, p63 and calponin immunohistochemical stains are performed which do not show a myoepithelial layer. 2 of 4 FINAL for [name], Rachel LEUTHOLD ([id number]) Microscopic Comment(continued) Although this is the case, the inclusions are benign and do not represent metastatic carcinoma. Both Dr. Donato Heinz and Dr. Lyndon Code have seen the left sentinel axillary lymph node in consultation with agreement that the inclusions are benign and do not represent  metastatic carcinoma. Willeen Niece MD Pathologist, Electronic Signature (Case signed 10/02/2011) Specimen Gross and Clinical Information Specimen(s) Obtained: 1. Breast, lumpectomy, Left 2. Lymph node, sentinel, biopsy, Left axillary#1 3. Breast, excision, Posterior margin 4. Lymph node, sentinel, biopsy, Left axillary #2 Specimen Clinical  RADIOGRAPHIC STUDIES:  ASSESSMENT: 68 year old with   1. 0.7 cm high grade invasive ductal carcinoma that is ER-, PR- Her2Neu +, sentinel node negative (T1bN0) pathologic stage I. Because patient is HER-2 positive she received adjuvant chemotherapy and Herceptin. She underwent 4 cycles of TCH combination and then Herceptin alone to complete out 1 years worth of treatment.  2.  Patient completed radiation therapy from 02/08/12 through 03/25/12.  3.  Patient with short bowel syndrome, followed by Dr. Sharlett Iles at [facility].     PLAN:   #1  Doing well in terms of her breast cancer without  signs of recurrence.  She is due for mammogram in 09/2013. Could not find Bone density.  #2 Patient will continue to follow with [facility] on short bowel syndrome and diarrhea   #3Prior weight loss appears stable for now  4#Patient is currently anemic which is definetly contributing to her symptoms.Record review reveals prior leucopenia and anemia,has reguired blood transfusion in the past.Denies ETOH,slightly up liver enzymes. Will order testing to evaluate the low WBC and low HCT. Will check B12(Hx of B12 def?low level previously)folate,Hepatitis c antibody,ANA,ferritin,ESR,CA 27-29,SPIE and TSH(Hy of Hypothyroidism post Radiation  Will follow up in 1-2 weeks to review above and reevaluate   All questions were answered. The patient knows to call the clinic with any problems, questions or concerns. We can certainly see the patient much sooner if necessary.  I spent 25 minutes counseling the patient face to face. The total time spent in the appointment  was 30 minutes.  Amada Kingfisher, M.D. Oncology/Hematology [place] [phone] (Office)  08/03/2013   ATTENDING'S ATTESTATION:  I personally reviewed patient's chart, examined patient myself, formulated the treatment plan as followed.     Overall she's doing well without any. She does continue to have some diarrhea due to she'll follow.. She has no evidence of recurrent breast cancer. She  will be seen back in 6 months time for followup.  Marcy Panning, MD Medical/Oncology [facility] [phone] (beeper) [phone] (Office)  08/09/2013, 8:23 AM

## 2013-08-09 NOTE — Progress Notes (Signed)
. .  OFFICE PROGRESS NOTE  CC  Renato Shin, MD [address] [place] [address] Dr. Fanny Skates  DIAGNOSIS: 68 year old female with new diagnosis of stage I ER-/PR-/Her2Neu positive invasive ductal breast cancer  PRIOR THERAPY: 1. 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 ER negative PR negative Her2 Neu positive with Ki -67 53%,   2. S/P porta cath placement for chemotherapy  #3 patient has begun her adjuvant chemotherapy consisting of Taxotere carboplatinum and Herceptin. Her treatment began in May 2013. A total of 4 cycles of Taxotere carboplatinum and Herceptin combination are planned. Once she completes this she will then proceed to radiation therapy with concomitant Herceptin to be given every 3 weeks to finish out a year of treatment.  #4 patient received 4 cycles of Taxotere carboplatinum and Herceptin between 10/30/2011 to 11/2011.  #5 Radiation therapy from 02/08/12 to 03/25/12.  #6 she then began Herceptin every 3 weeks starting on 01/20/2012.  This finished on May 22.2014.    CURRENT THERAPY: Observation  INTERVAL HISTORY: [name] 68 y.o. female returns for follow up today for evaluation of her breast cancer. Her weight has been  gradually declining .Record review shows 14 pounds unintentional weight loss  over the past year.She has short bowel syndrome and was seen previously by Dr. Sharlett Iles with [facility] who did tests and she f/u with Dr. Dalbert Batman who did not recommend surgical intervention for the adhesions at that time. She is not getting any current exercise.Her energy is fair. She reports intermittent dyspnea and feeling tired.She notes mild leg edema at times.She is seing black eye spots lately,she denies visual loss.She feels dizzy denies nausea but headache has been severe this weekend and constant She has stiffness joint hurting and history of  arthritis.Intermittent night sweats not severe and  appetite fair.She denies severe bone pain,denies cough.Reports no signs of blood loss,denies reflux or indigestion. Patient had no recent chest xray.She has seen cardiologist in the past while on Herceptin treatment but had no stress test or heart catheterization. She denies fevers, chills, nausea, vomiting She has history of anemia without prior diagnosis Patient comes in today to review recent ordered labs for evaluation of progressive anemia and leucopenia. On 08/03/2013 her TSH was 41.669 was seen by her Primary MD and Synthroid is increased to 150 mcg daily. Today she reports as above,complains of extreme weakness and chest fluttering.Her vision islurry,very tired. Mild leg edema   MEDICAL HISTORY: Past Medical History  Diagnosis Date  . Obesity   . PUD (peptic ulcer disease)   . Leukopenia   . Abdominal pain   . Short bowel syndrome   . Internal hemorrhoids without mention of complication   . Stricture and stenosis of esophagus   . Osteoarthrosis, unspecified whether generalized or localized, unspecified site   . Thyrotoxicosis without mention of goiter or other cause, without mention of thyrotoxic crisis or storm   . Other and unspecified hyperlipidemia   . Gout, unspecified   . Diverticulosis of colon (without mention of hemorrhage)   . C. difficile colitis   . VITAMIN B12 DEFICIENCY 08/30/2009  . GOITER, MULTINODULAR 04/02/2009  . HYPOTHYROIDISM, POST-RADIATION 08/13/2009  . ASYMPTOMATIC POSTMENOPAUSAL STATUS 10/11/2008  . Esophageal reflux 06/12/2008  . PONV (postoperative nausea and vomiting)   . Varicose veins   . Type II or unspecified type diabetes mellitus without mention of complication, not  stated as uncontrolled     no med in years diet controled  . Blood transfusion   . UTI (urinary tract infection)   . Kidney stones     "several"  . Unspecified essential hypertension   . Angina   . Shortness of breath on exertion      "sometimes"  . Anemia   . ANEMIA, IRON DEFICIENCY 05/08/2009  . History of lower GI bleeding   . H/O hiatal hernia   . Migraines   . Breast cancer 09/29/11    invasive grade III ductal ca,assoc high grade dcis,ER/PR=neg  . History of radiation therapy 02/08/12-03/25/12    left breast,total 61gy  . Blood transfusion without reported diagnosis   . Hypomagnesemia   . Hypokalemia 05/11/2013    ALLERGIES:  is allergic to aspirin; codeine; iodine; and morphine and related.  MEDICATIONS:  Current Outpatient Prescriptions  Medication Sig Dispense Refill  . Calcium Carbonate-Vitamin D (CALCIUM-VITAMIN D) 600-200 MG-UNIT CAPS Take 1 capsule by mouth daily.        . cyanocobalamin (,VITAMIN B-12,) 1000 MCG/ML injection Inject 1,000 mcg into the muscle every 30 (thirty) days. Next one due the 26th of this month      . fluorometholone (FML) 0.1 % ophthalmic suspension Place 1 drop into both eyes 2 (two) times daily.      Marland Kitchen HYDROcodone-acetaminophen (NORCO/VICODIN) 5-325 MG per tablet Take 1 tablet by mouth every 4 (four) hours as needed for moderate pain.  100 tablet  0  . levothyroxine (SYNTHROID, LEVOTHROID) 150 MCG tablet Take 1 tablet (150 mcg total) by mouth daily before breakfast.  30 tablet  0  . lovastatin (MEVACOR) 20 MG tablet Take 1 tablet (20 mg total) by mouth daily at 6 PM.  30 tablet  2  . magnesium chloride (SLOW-MAG) 64 MG TBEC Take 1 tablet by mouth daily.       Marland Kitchen omeprazole (PRILOSEC) 40 MG capsule TAKE 1 CAPSULE (40 MG TOTAL) BY MOUTH DAILY.  30 capsule  6  . spironolactone (ALDACTONE) 25 MG tablet Take 25 mg by mouth daily.      Marland Kitchen thiamine 100 MG tablet Take 100 mg by mouth daily.        . calcium gluconate 650 MG tablet Take 1 tablet (650 mg total) by mouth daily.  30 tablet  2  . methylPREDNIsolone (MEDROL DOSPACK) 4 MG tablet follow package directions  21 tablet  0   No current facility-administered medications for this visit.    SURGICAL HISTORY:  Past Surgical  History  Procedure Laterality Date  . Vein ligation and stripping  1980's    Right leg  . Lithotripsy      "4 or 5 times"  . Bunionectomy  1970's    bilateral  . Colonoscopy    . Esophagogastroduodenoscopy  07/15/2005  . Thyroid ultrasound  12/1994 and 12/1995  . Mastectomy w/ nodes partial  09/29/11    left  . Port a cath placement  09/29/11    right chest  . Breast surgery    . Cholecystectomy  1990's  . Abdominal hysterectomy  1970's    with BSO  . Dilation and curettage of uterus    . Colon surgery      "several surgeries for short bowel syndrome"  . Abdominal adhesion surgery  1980's thru 1990's    "several"  . Kidney stone surgery  1990's    "tried to go up & get it but pushed it further up"  .  Portacath placement  09/29/2011    Procedure: INSERTION PORT-A-CATH;  Surgeon: Adin Hector, MD;  Location: [facility];  Service: General;  Laterality: N/A;  . Breast biopsy  08/13/11    left breast lower inner quadrant  . Breast lumpectomy w/ needle localization  09/29/11    left  breast=lymph node,excision benign/ ER/PR=neg, her 2 Positive    REVIEW OF SYSTEMS:   A 10 point review of systems was conducted and is otherwise negative except for what is noted above.    Health Maintenance  Mammogram:  09/30/12 Colonoscopy: 2012 Bone Density Scan: unknown.   Pap Smear: TAH/BSO in 1970 Eye Exam: 2013 Vitamin D Level:  unknown Lipid Panel: 05/2013 scheduled   PHYSICAL EXAMINATION:  BP 121/80  Pulse 71  Temp(Src) 98.4 F (36.9 C) (Oral)  Resp 18  Ht _0  (1.651 m)  Wt 192 lb 9.6 oz (87.363 kg)  BMI 32.05 kg/m2 General: Patient is a well appearing female in no acute distress HEENT: PERRLA, sclerae anicteric  pae conjuctiva, MMM Neck: supple, no palpable adenopathy Lungs: clear to auscultation bilaterally, no wheezes, rhonchi, or rales Cardiovascular: regular rate rhythm, S1, S2, no murmurs, rubs or gallops Abdomen: Soft, non-tender, non-distended, normoactive bowel sounds, no  HSM Extremities: warm  well perfused, no  cyanosis or edema, left arm trace non-pitting edema Skin: No rashes or lesions Neuro: Non-focal Bilateral Breast Exam: left breast healing incision scar, with some tenderness, and radiation skin changes. Right breast no masses or nipple discharge ECOG PERFORMANCE STATUS: 1 - Symptomatic but completely ambulatory  LABORATORY DATA: Lab Results  Component Value Date   WBC 3.1* 08/03/2013   HGB 9.3* 08/03/2013   HCT 27.6* 08/03/2013   MCV 91.8 08/03/2013   PLT 174 08/03/2013      Chemistry      Component Value Date/Time   NA 144 08/03/2013 0816   NA 136 05/16/2013 1135   K 3.6 08/03/2013 0816   K 3.8 05/16/2013 1135   CL 99 05/16/2013 1135   CL 107 10/20/2012 1102   CO2 25 08/03/2013 0816   CO2 24 05/16/2013 1135   BUN 16.8 08/03/2013 0816   BUN 15 05/16/2013 1135   CREATININE 1.0 08/03/2013 0816   CREATININE 1.0 05/16/2013 1135   CREATININE 0.88 03/31/2011 1550      Component Value Date/Time   CALCIUM 8.1* 08/03/2013 0816   CALCIUM 9.9 05/16/2013 1135   CALCIUM 9.5 05/15/2010 2153   ALKPHOS 55 08/03/2013 0816   ALKPHOS 48 05/11/2013 0613   AST 38* 08/03/2013 0816   AST 26 05/11/2013 0613   ALT 29 08/03/2013 0816   ALT 18 05/11/2013 0613   BILITOT 0.81 08/03/2013 0816   BILITOT 0.6 05/11/2013 0272     ADDITIONAL INFORMATION: 1. PROGNOSTIC INDICATORS - ACIS Results IMMUNOHISTOCHEMICAL AND MORPHOMETRIC ANALYSIS BY THE AUTOMATED CELLULAR IMAGING SYSTEM (ACIS) Estrogen Receptor (Negative, <1%): 0%, NEGATIVE Progesterone Receptor (Negative, <1%): 0%, NEGATIVE COMMENT: The negative hormone receptor study(ies) in this case have an internal positive control. All controls stained appropriately Aldona Bar MD Pathologist, Electronic Signature ( Signed 10/07/2011) FINAL DIAGNOSIS 1 of 4 FINAL for Arrellano, [place] ([id number]) Diagnosis 1. Breast, lumpectomy, Left - INVASIVE GRADE III, DUCTAL CARCINOMA, SPANNING 0.7 CM. - ASSOCIATED HIGH GRADED  DUCTAL CARCINOMA IN SITU. - LYMPH/VASCULAR INVASION NOT IDENTIFIED. - MARGINS ARE NEGATIVE. - SEE ONCOLOGY TEMPLATE. 2. Lymph node, sentinel, biopsy, Left axillary#1 - ONE BENIGN LYMPH NODE WITH NO TUMOR (0/1). - BENIGN GLANDULAR EPITHELIAL INCLUSIONS PRESENT. -  SEE COMMENT. 3. Breast, excision, Posterior margin - BENIGN BREAST PARENCHYMA. - NO ATYPIA, HYPERPLASIA, OR MALIGNANCY IDENTIFIED. 4. Lymph node, sentinel, biopsy, Left axillary #2 - ONE BENIGN LYMPH NODE WITH NO TUMOR SEEN (0/1). Microscopic Comment 1. BREAST, INVASIVE TUMOR, WITH LYMPH NODE SAMPLING Specimen, including laterality: Left breast with posterior margin and sentinel lymph nodes. Procedure: Left breast lumpectomy with posterior margin excision and sentinel lymph node biopsies. Grade: III. Tubule formation: 3. Nuclear pleomorphism: 3. Mitotic:3. Tumor size (gross measurement and glass slide measurement): 0.7 cm. Margins: Invasive, distance to closest margin: At least 0.8 cm. In-situ, distance to closest margin: At least 0.8 cm. Lymphovascular invasion: Not identified. Ductal carcinoma in situ: Yes. Grade: High grade. Extensive intraductal component: No. Lobular neoplasia: No. Tumor focality: Unifocal. Treatment effect: N/A. Extent of tumor: Confined to breast parenchyma. Lymph nodes: # examined: 2. Lymph nodes with metastasis: 0. Breast prognostic profile: Performed on previous case ([id number]) Estrogen receptor: 0%, negative. Progesterone receptor: 0%, negative. Her 2 neu: 3.09, amplified. Ki-67: 53%. Non-neoplastic breast: Fat necrosis present. TNM: pT1b, pN0, MX. Comments: An estrogen receptor and progesterone receptor will be repeated on the current tumor and reported in an addendum. (RAH:gt, 10/01/11) 2. The left sentinel axillary lymph node #1 shows benign glandular epithelial inclusions. Some of the inclusions are ciliated. The nuclei are bland appearing and are not malignant. Smooth muscle  myosin, p63 and calponin immunohistochemical stains are performed which do not show a myoepithelial layer. 2 of 4 FINAL for Eichelberger, DELANI KOHLI ([id number]) Microscopic Comment(continued) Although this is the case, the inclusions are benign and do not represent metastatic carcinoma. Both Dr. Donato Heinz and Dr. Lyndon Code have seen the left sentinel axillary lymph node in consultation with agreement that the inclusions are benign and do not represent metastatic carcinoma. Willeen Niece MD Pathologist, Electronic Signature (Case signed 10/02/2011) Specimen Gross and Clinical Information Specimen(s) Obtained: 1. Breast, lumpectomy, Left 2. Lymph node, sentinel, biopsy, Left axillary#1 3. Breast, excision, Posterior margin 4. Lymph node, sentinel, biopsy, Left axillary #2 Specimen Clinical  RADIOGRAPHIC STUDIES:  ASSESSMENT: 68 year old with   1. 0.7 cm high grade invasive ductal carcinoma that is ER-, PR- Her2Neu +, sentinel node negative (T1bN0) pathologic stage I. Because patient is HER-2 positive she received adjuvant chemotherapy and Herceptin. She underwent 4 cycles of TCH combination and then Herceptin alone to complete out 1 years worth of treatment.  2.  Patient completed radiation therapy from 02/08/12 through 03/25/12.  3.  Patient with short bowel syndrome, followed by Dr. Sharlett Iles at [facility].     PLAN:   #1  Doing well in terms of her breast cancer without  signs of recurrence. CA 27-29 normal She is due for mammogram in 09/2013 will schedule Could not find Bone density will schedule.Check Vit D.  #2 Patient will continue to follow with [facility] on short bowel syndrome and diarrhea   #3weight loss 14 pounds in one year  4#Patient is currently anemic which is definetly contributing to her symptoms.Also has leucopenia. Record review reveals prior leucopenia and anemia,has reguired blood transfusion in the past.Denies ETOH,slightly up liver enzymes. Ordered  testing to  evaluate the low WBC and low HCT. Hepatitis C  antibody negative,ANa negative,ESR stable,SPIE no M spike B12 level stable continue on replacement for life since documented prior B12 level low at 177 folate OK  TSH 41.1 S/P thyroid Radiation on increased Synthroid by her Primary MD.  Weakness patient reports muscle cramps will check Mg level.  Anemia symptomatic  with leucopenia will check today CBC,LDH,Retic count.  Discussed regarding anemia  further evaluation and patient agreed for bone marrow  with biopsy if recommended after  review her labs from today. Procedure was explained to patient.Will call her to schedule if decide to proceed.     All questions were answered. The patient knows to call the clinic with any problems, questions or concerns. We can certainly see the patient much sooner if necessary.  I spent 25 minutes counseling the patient face to face. The total time spent in the appointment was 30 minutes.  Amada Kingfisher, M.D. Oncology/Hematology [place] [phone] (Office)  08/09/2013    I personally reviewed patient's chart, examined patient myself, formulated the treatment plan as followed.

## 2013-08-09 NOTE — Patient Instructions (Signed)
Hypomagnesemia Magnesium is a common ion (mineral) in the body which is needed for metabolism. It is about how the body handles food and other chemical reactions necessary for life. Only about 2% of the magnesium in our body is found in the blood. When this is low, it is called hypomagnesemia. The blood will measure only a tiny amount of the magnesium in our body. When it is low in our blood, it does not mean that the whole body supply is low. The normal serum concentration ranges from 1.8-2.5 mEq/L. When the level gets to be less than 1.0 mEq/L, a number of problems begin to happen.  CAUSES   Receiving intravenous fluids without magnesium replacement.  Loss of magnesium from the bowel by naso-gastric suction.  Loss of magnesium from nausea and vomiting or severe diarrhea. Any of the inflammatory bowel conditions can cause this.  Abuse of alcohol often leads to low serum magnesium.  An inherited form of magnesium loss happens when the kidneys lose magnesium. This is called familial or primary hypomagnesemia.  Some medications such as diuretics also cause the loss of magnesium. SYMPTOMS  These following problems are worse if the changes in magnesium levels come on suddenly.  Tremor.  Confusion.  Muscle weakness.  Over-sensitive to sights and sounds.  Sensitive reflexes.  Depression.  Muscular fibrillations.  Over-reactivity of the nerves.  Irritability.  Psychosis.  Spasms of the hand muscles.  Tetany (where the muscles go into uncontrollable spasms). DIAGNOSIS  This condition can be diagnosed by blood tests. TREATMENT   In emergency, magnesium can be given intravenously (by vein).  If the condition is less worrisome, it can be corrected by diet. High levels of magnesium are found in green leafy vegetables, peas, beans and nuts among other things. It can also be given through medications by mouth.  If it is being caused by medications, changes can be made.  If  alcohol is a problem, help is available if there are difficulties giving it up. Document Released: 02/11/2005 Document Revised: 08/10/2011 Document Reviewed: 01/06/2008 [employer] Patient Information 2014 [employer].        

## 2013-08-10 ENCOUNTER — Other Ambulatory Visit: Payer: Self-pay | Admitting: Hematology and Oncology

## 2013-08-10 ENCOUNTER — Telehealth: Payer: Self-pay

## 2013-08-10 ENCOUNTER — Other Ambulatory Visit (HOSPITAL_BASED_OUTPATIENT_CLINIC_OR_DEPARTMENT_OTHER): Payer: Medicare Other

## 2013-08-10 ENCOUNTER — Other Ambulatory Visit: Payer: Self-pay

## 2013-08-10 DIAGNOSIS — C50919 Malignant neoplasm of unspecified site of unspecified female breast: Secondary | ICD-10-CM

## 2013-08-10 DIAGNOSIS — E876 Hypokalemia: Secondary | ICD-10-CM

## 2013-08-10 DIAGNOSIS — E559 Vitamin D deficiency, unspecified: Secondary | ICD-10-CM

## 2013-08-10 DIAGNOSIS — R531 Weakness: Secondary | ICD-10-CM

## 2013-08-10 DIAGNOSIS — D649 Anemia, unspecified: Secondary | ICD-10-CM

## 2013-08-10 DIAGNOSIS — C50319 Malignant neoplasm of lower-inner quadrant of unspecified female breast: Secondary | ICD-10-CM

## 2013-08-10 LAB — COMPREHENSIVE METABOLIC PANEL (CC13)
ALK PHOS: 56 U/L (ref 40–150)
ALT: 25 U/L (ref 0–55)
AST: 31 U/L (ref 5–34)
Albumin: 3.6 g/dL (ref 3.5–5.0)
Anion Gap: 12 mEq/L — ABNORMAL HIGH (ref 3–11)
BILIRUBIN TOTAL: 0.91 mg/dL (ref 0.20–1.20)
BUN: 14 mg/dL (ref 7.0–26.0)
CO2: 19 mEq/L — ABNORMAL LOW (ref 22–29)
Calcium: 8.7 mg/dL (ref 8.4–10.4)
Chloride: 111 mEq/L — ABNORMAL HIGH (ref 98–109)
Creatinine: 1.1 mg/dL (ref 0.6–1.1)
Glucose: 84 mg/dl (ref 70–140)
Potassium: 3.3 mEq/L — ABNORMAL LOW (ref 3.5–5.1)
SODIUM: 142 meq/L (ref 136–145)
TOTAL PROTEIN: 6.9 g/dL (ref 6.4–8.3)

## 2013-08-10 LAB — MAGNESIUM (CC13): Magnesium: 1.5 mg/dl (ref 1.5–2.5)

## 2013-08-10 LAB — VITAMIN D 25 HYDROXY (VIT D DEFICIENCY, FRACTURES): Vit D, 25-Hydroxy: 26 ng/mL — ABNORMAL LOW (ref 30–89)

## 2013-08-10 MED ORDER — POTASSIUM CHLORIDE CRYS ER 20 MEQ PO TBCR: 20.0000 meq | EXTENDED_RELEASE_TABLET | Freq: Every day | ORAL | Status: DC

## 2013-08-10 MED ORDER — ERGOCALCIFEROL 1.25 MG (50000 UT) PO CAPS
50000.0000 [IU] | ORAL_CAPSULE | ORAL | Status: DC
Start: 2013-08-10 — End: 2014-06-27

## 2013-08-10 MED ORDER — MAGNESIUM OXIDE 400 (241.3 MG) MG PO TABS: 400.0000 mg | ORAL_TABLET | Freq: Two times a day (BID) | ORAL | Status: DC

## 2013-08-10 NOTE — Telephone Encounter (Signed)
Spoke with patient, informed her that her magnesium did come up but was on the borderline at 1.5mg   and Dr.Faidas wanted to order some oral Magnesium along with Vitamin D and Potassium because these levels were low. Also informed patient that Dr.Faidas would like to recheck her labs next week, so scheduling will be contacting her to set up an appointment and then Dr.Faidas would check them to see about a follow up appt.. Verified patients pharmacy was [facility] on [address]. Pt denies and questions or concerns at this time. Informed her that if she any concerns to call us.

## 2013-08-11 ENCOUNTER — Other Ambulatory Visit: Payer: Self-pay | Admitting: Oncology

## 2013-08-14 ENCOUNTER — Telehealth: Payer: Self-pay | Admitting: Hematology and Oncology

## 2013-08-14 ENCOUNTER — Encounter: Payer: Self-pay | Admitting: Endocrinology

## 2013-08-14 ENCOUNTER — Ambulatory Visit (INDEPENDENT_AMBULATORY_CARE_PROVIDER_SITE_OTHER): Payer: Medicare Other | Admitting: Endocrinology

## 2013-08-14 VITALS — BP 118/76 | HR 71 | Temp 97.7°F | Ht 65.0 in | Wt 197.0 lb

## 2013-08-14 DIAGNOSIS — E042 Nontoxic multinodular goiter: Secondary | ICD-10-CM

## 2013-08-14 DIAGNOSIS — E538 Deficiency of other specified B group vitamins: Secondary | ICD-10-CM

## 2013-08-14 MED ORDER — HYDROCODONE-ACETAMINOPHEN 10-325 MG PO TABS: 1.0000 | ORAL_TABLET | Freq: Four times a day (QID) | ORAL | Status: DC | PRN

## 2013-08-14 MED ORDER — CYANOCOBALAMIN 1000 MCG/ML IJ SOLN
1000.0000 ug | Freq: Once | INTRAMUSCULAR | Status: AC
  Administered 2013-08-14: 1000 ug via INTRAMUSCULAR

## 2013-08-14 NOTE — Patient Instructions (Addendum)
Please continue the increased levothyroxine. Please come back for a regular physical appointment in 1 month. We'll recheck your blood tests then. Let's recheck the thyroid ultrasound.  you will receive a phone call, about a day and time for an appointment. Here is a prescription, for the stronger pain pill.

## 2013-08-14 NOTE — Progress Notes (Signed)
Subjective:    Patient ID: [name], female    DOB: 1946-02-16, 68 y.o.   MRN: [id number]  HPI In 2010, pt had i-131 rx for hyperthyroidism, due to a multinodular goiter.  She has been on synthroid since then. She has slight headache throughout the head, and assoc insomnia.   Past Medical History  Diagnosis Date  . Obesity   . PUD (peptic ulcer disease)   . Leukopenia   . Abdominal pain   . Short bowel syndrome   . Internal hemorrhoids without mention of complication   . Stricture and stenosis of esophagus   . Osteoarthrosis, unspecified whether generalized or localized, unspecified site   . Thyrotoxicosis without mention of goiter or other cause, without mention of thyrotoxic crisis or storm   . Other and unspecified hyperlipidemia   . Gout, unspecified   . Diverticulosis of colon (without mention of hemorrhage)   . C. difficile colitis   . VITAMIN B12 DEFICIENCY 08/30/2009  . GOITER, MULTINODULAR 04/02/2009  . HYPOTHYROIDISM, POST-RADIATION 08/13/2009  . ASYMPTOMATIC POSTMENOPAUSAL STATUS 10/11/2008  . Esophageal reflux 06/12/2008  . PONV (postoperative nausea and vomiting)   . Varicose veins   . Type II or unspecified type diabetes mellitus without mention of complication, not stated as uncontrolled     no med in years diet controled  . Blood transfusion   . UTI (urinary tract infection)   . Kidney stones     "several"  . Unspecified essential hypertension   . Angina   . Shortness of breath on exertion     "sometimes"  . Anemia   . ANEMIA, IRON DEFICIENCY 05/08/2009  . History of lower GI bleeding   . H/O hiatal hernia   . Migraines   . Breast cancer 09/29/11    invasive grade III ductal ca,assoc high grade dcis,ER/PR=neg  . History of radiation therapy 02/08/12-03/25/12    left breast,total 61gy  . Blood transfusion without reported diagnosis   . Hypomagnesemia   . Hypokalemia 05/11/2013    Past Surgical History  Procedure Laterality Date  . Vein ligation  and stripping  1980's    Right leg  . Lithotripsy      "4 or 5 times"  . Bunionectomy  1970's    bilateral  . Colonoscopy    . Esophagogastroduodenoscopy  07/15/2005  . Thyroid ultrasound  12/1994 and 12/1995  . Mastectomy w/ nodes partial  09/29/11    left  . Port a cath placement  09/29/11    right chest  . Breast surgery    . Cholecystectomy  1990's  . Abdominal hysterectomy  1970's    with BSO  . Dilation and curettage of uterus    . Colon surgery      "several surgeries for short bowel syndrome"  . Abdominal adhesion surgery  1980's thru 1990's    "several"  . Kidney stone surgery  1990's    "tried to go up & get it but pushed it further up"  . Portacath placement  09/29/2011    Procedure: INSERTION PORT-A-CATH;  Surgeon: Adin Hector, MD;  Location: [facility];  Service: General;  Laterality: N/A;  . Breast biopsy  08/13/11    left breast lower inner quadrant  . Breast lumpectomy w/ needle localization  09/29/11    left  breast=lymph node,excision benign/ ER/PR=neg, her 2 Positive    History   Social History  . Marital Status: Married    Spouse Name: N/A  Number of Children: N/A  . Years of Education: N/A   Occupational History  . Not on file.   Social History Main Topics  . Smoking status: Former Smoker -- 1.00 packs/day for 10 years    Types: Cigarettes    Quit date: 09/22/1985  . Smokeless tobacco: Never Used  . Alcohol Use: No     Comment: 09/29/11 "used to drink socially years ago"  . Drug Use: No  . Sexual Activity: No     Comment: HRT x many yrs   Other Topics Concern  . Not on file   Social History Narrative   Pt gets regular exercise    Current Outpatient Prescriptions on File Prior to Visit  Medication Sig Dispense Refill  . Calcium Carbonate-Vitamin D (CALCIUM-VITAMIN D) 600-200 MG-UNIT CAPS Take 1 capsule by mouth daily.        . ergocalciferol (VITAMIN D2) 50000 UNITS capsule Take 1 capsule (50,000 Units total) by mouth once a week.  4  capsule  3  . levothyroxine (SYNTHROID, LEVOTHROID) 150 MCG tablet Take 1 tablet (150 mcg total) by mouth daily before breakfast.  30 tablet  0  . lovastatin (MEVACOR) 20 MG tablet Take 1 tablet (20 mg total) by mouth daily at 6 PM.  30 tablet  2  . magnesium chloride (SLOW-MAG) 64 MG TBEC Take 1 tablet by mouth daily.       . potassium chloride SA (K-DUR,KLOR-CON) 20 MEQ tablet Take 1 tablet (20 mEq total) by mouth daily.  30 tablet  1  . spironolactone (ALDACTONE) 25 MG tablet Take 25 mg by mouth daily.      . calcium gluconate 650 MG tablet Take 1 tablet (650 mg total) by mouth daily.  30 tablet  2  . cyanocobalamin (,VITAMIN B-12,) 1000 MCG/ML injection Inject 1,000 mcg into the muscle every 30 (thirty) days. Next one due the 26th of this month      . fluorometholone (FML) 0.1 % ophthalmic suspension Place 1 drop into both eyes 2 (two) times daily.      . magnesium oxide (MAG-OX) 400 (241.3 MG) MG tablet Take 1 tablet (400 mg total) by mouth 2 (two) times daily.  30 tablet  1  . methylPREDNIsolone (MEDROL DOSPACK) 4 MG tablet follow package directions  21 tablet  0  . omeprazole (PRILOSEC) 40 MG capsule TAKE 1 CAPSULE (40 MG TOTAL) BY MOUTH DAILY.  30 capsule  6  . thiamine 100 MG tablet Take 100 mg by mouth daily.         Current Facility-Administered Medications on File Prior to Visit  Medication Dose Route Frequency Provider Last Rate Last Dose  . acetaminophen (TYLENOL) tablet 1,000 mg  1,000 mg Oral Once Amada Kingfisher, MD        Allergies  Allergen Reactions  . Aspirin Other (See Comments)    REACTION: Gi Intolerance/ Burning in stomach  . Codeine Itching  . Iodine Itching    Allergic to IVP dye  . Morphine And Related Itching    Family History  Problem Relation Age of Onset  . Esophageal cancer Son     deceased  . Cancer Son     Esophageal Cancer  . Diabetes Mother   . Heart disease Mother   . Colon cancer Paternal Uncle   . Cancer Paternal Uncle     Colon Cancer  .  Kidney disease Sister   . Diabetes Father   . Hypertension Father   . Kidney disease Brother   .  Kidney disease Brother   . Kidney disease Brother    BP 118/76  Pulse 71  Temp(Src) 97.7 F (36.5 C) (Oral)  Ht 5\' 5"  (1.651 m)  Wt 197 lb (89.359 kg)  BMI 32.78 kg/m2  SpO2 99%  Review of Systems Denies weight change and depression.    Objective:   Physical Exam VITAL SIGNS:  See vs page GENERAL: no distress NECK: There is no palpable thyroid enlargement.  No thyroid nodule is palpable.  No palpable lymphadenopathy at the anterior neck.       Assessment & Plan:  Post-i-131 hypothyroidism, on rx headache and other pain: she needs increased rx Multinodular goiter: non-palpable.  f/u US is due

## 2013-08-14 NOTE — Telephone Encounter (Signed)
, °

## 2013-08-17 ENCOUNTER — Other Ambulatory Visit: Payer: Self-pay

## 2013-08-17 ENCOUNTER — Other Ambulatory Visit (HOSPITAL_BASED_OUTPATIENT_CLINIC_OR_DEPARTMENT_OTHER): Payer: Medicare Other

## 2013-08-17 ENCOUNTER — Other Ambulatory Visit: Payer: Self-pay | Admitting: Hematology and Oncology

## 2013-08-17 ENCOUNTER — Telehealth: Payer: Self-pay

## 2013-08-17 ENCOUNTER — Ambulatory Visit
Admission: RE | Admit: 2013-08-17 | Discharge: 2013-08-17 | Disposition: A | Discharge status: Home / Self Care | Payer: Medicare Other | Source: Ambulatory Visit | Attending: Endocrinology | Admitting: Endocrinology

## 2013-08-17 DIAGNOSIS — E042 Nontoxic multinodular goiter: Secondary | ICD-10-CM

## 2013-08-17 DIAGNOSIS — E876 Hypokalemia: Secondary | ICD-10-CM

## 2013-08-17 DIAGNOSIS — C50319 Malignant neoplasm of lower-inner quadrant of unspecified female breast: Secondary | ICD-10-CM

## 2013-08-17 LAB — COMPREHENSIVE METABOLIC PANEL (CC13)
ALK PHOS: 67 U/L (ref 40–150)
ALT: 23 U/L (ref 0–55)
ANION GAP: 10 meq/L (ref 3–11)
AST: 27 U/L (ref 5–34)
Albumin: 4 g/dL (ref 3.5–5.0)
BILIRUBIN TOTAL: 0.97 mg/dL (ref 0.20–1.20)
BUN: 17.2 mg/dL (ref 7.0–26.0)
CO2: 28 meq/L (ref 22–29)
CREATININE: 1.1 mg/dL (ref 0.6–1.1)
Calcium: 10.4 mg/dL (ref 8.4–10.4)
Chloride: 103 mEq/L (ref 98–109)
Glucose: 93 mg/dl (ref 70–140)
Potassium: 4.5 mEq/L (ref 3.5–5.1)
SODIUM: 140 meq/L (ref 136–145)
Total Protein: 7.6 g/dL (ref 6.4–8.3)

## 2013-08-17 LAB — CBC WITH DIFFERENTIAL/PLATELET
BASO%: 0.4 % (ref 0.0–2.0)
Basophils Absolute: 0 10*3/uL (ref 0.0–0.1)
EOS%: 2 % (ref 0.0–7.0)
Eosinophils Absolute: 0.1 10*3/uL (ref 0.0–0.5)
HEMATOCRIT: 29.3 % — AB (ref 34.8–46.6)
HGB: 9.8 g/dL — ABNORMAL LOW (ref 11.6–15.9)
LYMPH%: 33.6 % (ref 14.0–49.7)
MCH: 30.6 pg (ref 25.1–34.0)
MCHC: 33.3 g/dL (ref 31.5–36.0)
MCV: 91.8 fL (ref 79.5–101.0)
MONO#: 0.4 10*3/uL (ref 0.1–0.9)
MONO%: 12.2 % (ref 0.0–14.0)
NEUT#: 1.8 10*3/uL (ref 1.5–6.5)
NEUT%: 51.8 % (ref 38.4–76.8)
PLATELETS: 195 10*3/uL (ref 145–400)
RBC: 3.19 10*6/uL — ABNORMAL LOW (ref 3.70–5.45)
RDW: 13.7 % (ref 11.2–14.5)
WBC: 3.4 10*3/uL — AB (ref 3.9–10.3)
lymph#: 1.1 10*3/uL (ref 0.9–3.3)

## 2013-08-17 LAB — MAGNESIUM (CC13): Magnesium: 1.4 mg/dl — CL (ref 1.5–2.5)

## 2013-08-17 MED ORDER — MAGNESIUM OXIDE 400 (241.3 MG) MG PO TABS: 400.0000 mg | ORAL_TABLET | Freq: Three times a day (TID) | ORAL | Status: AC

## 2013-08-17 NOTE — Telephone Encounter (Signed)
Spoke with patient and informed her that her magnesium level dropped to 1.4mg /dl from 1.5mg /dl, informed her Dr.Faiadas would her to begin taking her Magnesium 400mg  TID instead of BID as long as patient did not have diarrhea, patient denies any diarrhea at this time. Also informed her that we would recheck her labs in one month and to call with any problems before then.  Patient verbalizes understanding, denies any questions or concerns at this time. POF sent to scheduling for Labs in one month.

## 2013-08-18 ENCOUNTER — Telehealth: Payer: Self-pay | Admitting: Endocrinology

## 2013-08-18 MED ORDER — TRAZODONE HCL 300 MG PO TABS: 300.0000 mg | ORAL_TABLET | Freq: Every day | ORAL | Status: DC

## 2013-08-18 NOTE — Telephone Encounter (Signed)
Ok, i have sent a prescription to your pharmacy  

## 2013-08-18 NOTE — Telephone Encounter (Signed)
Pt would like [place] to call in a rs for her nerves she is having a hard time sleeping

## 2013-08-18 NOTE — Telephone Encounter (Signed)
See below. Pt states that she has been having trouble sleeping since last visit. She states that she was on Trazodone and came off it and would prefer to go back on that or a similar medication.  Please advise, Thanks!

## 2013-08-18 NOTE — Addendum Note (Signed)
Addended by: Renato Shin on: 08/18/2013 12:52 PM   Modules accepted: Orders

## 2013-08-21 ENCOUNTER — Telehealth: Payer: Self-pay | Admitting: Oncology

## 2013-08-21 ENCOUNTER — Telehealth: Payer: Self-pay | Admitting: Endocrinology

## 2013-08-21 MED ORDER — ZOLPIDEM TARTRATE 5 MG PO TABS: 5.0000 mg | ORAL_TABLET | Freq: Every evening | ORAL | Status: DC | PRN

## 2013-08-21 NOTE — Telephone Encounter (Signed)
Ok, i printed rx for [facility], to take on a trial basis

## 2013-08-21 NOTE — Telephone Encounter (Signed)
Waiting on Dr's response to alternative.

## 2013-08-21 NOTE — Telephone Encounter (Signed)
Pt informed

## 2013-08-21 NOTE — Telephone Encounter (Signed)
s/w pt re appt for lab 4/16 - one month per 3/19 pof. no other orders.

## 2013-08-21 NOTE — Telephone Encounter (Signed)
Pt states she had called Friday and Dr. Loanne Drilling called in trazadone and pt can not take this  Please call in something different   Please advise pt  Call [phone]  Thank You :)

## 2013-08-21 NOTE — Addendum Note (Signed)
Addended by: Renato Shin on: 08/21/2013 12:23 PM   Modules accepted: Orders

## 2013-08-21 NOTE — Telephone Encounter (Signed)
Pt called and stated that the Trazodone has been making her sick and wanted to know if an alternative could be given. Please advise, Thanks!

## 2013-08-24 ENCOUNTER — Other Ambulatory Visit: Payer: Medicare Other

## 2013-08-28 ENCOUNTER — Telehealth: Payer: Self-pay | Admitting: *Deleted

## 2013-08-28 ENCOUNTER — Ambulatory Visit (INDEPENDENT_AMBULATORY_CARE_PROVIDER_SITE_OTHER): Payer: Medicare Other | Admitting: Endocrinology

## 2013-08-28 ENCOUNTER — Encounter: Payer: Self-pay | Admitting: Endocrinology

## 2013-08-28 ENCOUNTER — Other Ambulatory Visit: Payer: Self-pay

## 2013-08-28 VITALS — BP 122/80 | HR 64 | Temp 97.5°F | Ht 65.0 in | Wt 191.0 lb

## 2013-08-28 DIAGNOSIS — K222 Esophageal obstruction: Secondary | ICD-10-CM

## 2013-08-28 DIAGNOSIS — E890 Postprocedural hypothyroidism: Secondary | ICD-10-CM

## 2013-08-28 DIAGNOSIS — D509 Iron deficiency anemia, unspecified: Secondary | ICD-10-CM

## 2013-08-28 NOTE — Telephone Encounter (Signed)
Call from pt reporting fatigue, weakness and achiness since 3/27. Pt denies fever. Requests to have labs drawn. Recommended pt call PCP to further evaluate fatigue. She agreed to do so. Will make Dr. Humphrey Rolls aware.

## 2013-08-28 NOTE — Progress Notes (Signed)
Subjective:    Patient ID: [name], female    DOB: 1946/02/04, 68 y.o.   MRN: [id number]  HPI In 2010, pt had i-131 rx for hyperthyroidism, due to a multinodular goiter.  She has been on synthroid since then. She has persistent fatigue.   Pain is now well-controlled.  Pt reports few months of solid-only dysphagia in the anterior chest, but no assoc pain.   Past Medical History  Diagnosis Date  . Obesity   . PUD (peptic ulcer disease)   . Leukopenia   . Abdominal pain   . Short bowel syndrome   . Internal hemorrhoids without mention of complication   . Stricture and stenosis of esophagus   . Osteoarthrosis, unspecified whether generalized or localized, unspecified site   . Thyrotoxicosis without mention of goiter or other cause, without mention of thyrotoxic crisis or storm   . Other and unspecified hyperlipidemia   . Gout, unspecified   . Diverticulosis of colon (without mention of hemorrhage)   . C. difficile colitis   . VITAMIN B12 DEFICIENCY 08/30/2009  . GOITER, MULTINODULAR 04/02/2009  . HYPOTHYROIDISM, POST-RADIATION 08/13/2009  . ASYMPTOMATIC POSTMENOPAUSAL STATUS 10/11/2008  . Esophageal reflux 06/12/2008  . PONV (postoperative nausea and vomiting)   . Varicose veins   . Type II or unspecified type diabetes mellitus without mention of complication, not stated as uncontrolled     no med in years diet controled  . Blood transfusion   . UTI (urinary tract infection)   . Kidney stones     "several"  . Unspecified essential hypertension   . Angina   . Shortness of breath on exertion     "sometimes"  . Anemia   . ANEMIA, IRON DEFICIENCY 05/08/2009  . History of lower GI bleeding   . H/O hiatal hernia   . Migraines   . Breast cancer 09/29/11    invasive grade III ductal ca,assoc high grade dcis,ER/PR=neg  . History of radiation therapy 02/08/12-03/25/12    left breast,total 61gy  . Blood transfusion without reported diagnosis   . Hypomagnesemia   . Hypokalemia  05/11/2013    Past Surgical History  Procedure Laterality Date  . Vein ligation and stripping  1980's    Right leg  . Lithotripsy      "4 or 5 times"  . Bunionectomy  1970's    bilateral  . Colonoscopy    . Esophagogastroduodenoscopy  07/15/2005  . Thyroid ultrasound  12/1994 and 12/1995  . Mastectomy w/ nodes partial  09/29/11    left  . Port a cath placement  09/29/11    right chest  . Breast surgery    . Cholecystectomy  1990's  . Abdominal hysterectomy  1970's    with BSO  . Dilation and curettage of uterus    . Colon surgery      "several surgeries for short bowel syndrome"  . Abdominal adhesion surgery  1980's thru 1990's    "several"  . Kidney stone surgery  1990's    "tried to go up & get it but pushed it further up"  . Portacath placement  09/29/2011    Procedure: INSERTION PORT-A-CATH;  Surgeon: Adin Hector, MD;  Location: [facility];  Service: General;  Laterality: N/A;  . Breast biopsy  08/13/11    left breast lower inner quadrant  . Breast lumpectomy w/ needle localization  09/29/11    left  breast=lymph node,excision benign/ ER/PR=neg, her 2 Positive    History  Social History  . Marital Status: Married    Spouse Name: N/A    Number of Children: N/A  . Years of Education: N/A   Occupational History  . Not on file.   Social History Main Topics  . Smoking status: Former Smoker -- 1.00 packs/day for 10 years    Types: Cigarettes    Quit date: 09/22/1985  . Smokeless tobacco: Never Used  . Alcohol Use: No     Comment: 09/29/11 "used to drink socially years ago"  . Drug Use: No  . Sexual Activity: No     Comment: HRT x many yrs   Other Topics Concern  . Not on file   Social History Narrative   Pt gets regular exercise    Current Outpatient Prescriptions on File Prior to Visit  Medication Sig Dispense Refill  . Calcium Carbonate-Vitamin D (CALCIUM-VITAMIN D) 600-200 MG-UNIT CAPS Take 1 capsule by mouth daily.        . cyanocobalamin (,VITAMIN  B-12,) 1000 MCG/ML injection Inject 1,000 mcg into the muscle every 30 (thirty) days. Next one due the 26th of this month      . ergocalciferol (VITAMIN D2) 50000 UNITS capsule Take 1 capsule (50,000 Units total) by mouth once a week.  4 capsule  3  . fluorometholone (FML) 0.1 % ophthalmic suspension Place 1 drop into both eyes 2 (two) times daily.      Marland Kitchen HYDROcodone-acetaminophen (NORCO) 10-325 MG per tablet Take 1 tablet by mouth every 6 (six) hours as needed.  100 tablet  0  . levothyroxine (SYNTHROID, LEVOTHROID) 150 MCG tablet Take 1 tablet (150 mcg total) by mouth daily before breakfast.  30 tablet  0  . lovastatin (MEVACOR) 20 MG tablet Take 1 tablet (20 mg total) by mouth daily at 6 PM.  30 tablet  2  . magnesium chloride (SLOW-MAG) 64 MG TBEC Take 1 tablet by mouth daily.       . magnesium oxide (MAG-OX) 400 (241.3 MG) MG tablet Take 1 tablet (400 mg total) by mouth 3 (three) times daily.  30 tablet  1  . methylPREDNIsolone (MEDROL DOSPACK) 4 MG tablet follow package directions  21 tablet  0  . omeprazole (PRILOSEC) 40 MG capsule TAKE 1 CAPSULE (40 MG TOTAL) BY MOUTH DAILY.  30 capsule  6  . potassium chloride SA (K-DUR,KLOR-CON) 20 MEQ tablet Take 1 tablet (20 mEq total) by mouth daily.  30 tablet  1  . spironolactone (ALDACTONE) 25 MG tablet Take 25 mg by mouth daily.      Marland Kitchen thiamine 100 MG tablet Take 100 mg by mouth daily.        Marland Kitchen zolpidem (AMBIEN) 5 MG tablet Take 1 tablet (5 mg total) by mouth at bedtime as needed for sleep.  30 tablet  0  . calcium gluconate 650 MG tablet Take 1 tablet (650 mg total) by mouth daily.  30 tablet  2   Current Facility-Administered Medications on File Prior to Visit  Medication Dose Route Frequency Provider Last Rate Last Dose  . acetaminophen (TYLENOL) tablet 1,000 mg  1,000 mg Oral Once Amada Kingfisher, MD        Allergies  Allergen Reactions  . Aspirin Other (See Comments)    REACTION: Gi Intolerance/ Burning in stomach  . Codeine Itching  .  Iodine Itching    Allergic to IVP dye  . Morphine And Related Itching    Family History  Problem Relation Age of Onset  . Esophageal cancer  Son     deceased  . Cancer Son     Esophageal Cancer  . Diabetes Mother   . Heart disease Mother   . Colon cancer Paternal Uncle   . Cancer Paternal Uncle     Colon Cancer  . Kidney disease Sister   . Diabetes Father   . Hypertension Father   . Kidney disease Brother   . Kidney disease Brother   . Kidney disease Brother     BP 122/80  Pulse 64  Temp(Src) 97.5 F (36.4 C) (Oral)  Ht 5\' 5"  (1.651 m)  Wt 191 lb (86.637 kg)  BMI 31.78 kg/m2  SpO2 98%  Review of Systems Denies weight change.  She has lost a few lbs.      Objective:   Physical Exam VITAL SIGNS:  See vs page GENERAL: no distress ABDOMEN: abdomen is soft, nontender.  no hepatosplenomegaly.   not distended.  no hernia.  Lab Results  Component Value Date   TSH 0.83 08/28/2013   T4TOTAL 8.1 07/29/2007      Assessment & Plan:  Dysphagia: ? Recurrent esophageal stricture. Hypothyroidism: well-replaced Chronic pain syndrome: now well-controlled

## 2013-08-28 NOTE — Patient Instructions (Addendum)
Please go to see a new stomach specialist.  you will receive a phone call, about a day and time for an appointment.  You may need to have your esophagus stretched again.

## 2013-08-29 ENCOUNTER — Encounter: Payer: Self-pay | Admitting: Endocrinology

## 2013-08-29 ENCOUNTER — Encounter: Payer: Self-pay | Admitting: Gastroenterology

## 2013-08-29 LAB — MAGNESIUM: MAGNESIUM: 1.3 mg/dL — AB (ref 1.5–2.5)

## 2013-08-29 LAB — IBC PANEL
IRON: 71 ug/dL (ref 42–145)
Saturation Ratios: 16.4 % — ABNORMAL LOW (ref 20.0–50.0)
Transferrin: 309.4 mg/dL (ref 212.0–360.0)

## 2013-08-29 LAB — TSH: TSH: 0.83 u[IU]/mL (ref 0.35–5.50)

## 2013-09-04 ENCOUNTER — Ambulatory Visit (INDEPENDENT_AMBULATORY_CARE_PROVIDER_SITE_OTHER): Payer: Medicare Other | Admitting: Nurse Practitioner

## 2013-09-04 ENCOUNTER — Encounter: Payer: Self-pay | Admitting: Nurse Practitioner

## 2013-09-04 VITALS — BP 100/80 | HR 72 | Ht 65.0 in | Wt 188.6 lb

## 2013-09-04 DIAGNOSIS — K222 Esophageal obstruction: Secondary | ICD-10-CM

## 2013-09-04 DIAGNOSIS — R1319 Other dysphagia: Secondary | ICD-10-CM | POA: Insufficient documentation

## 2013-09-04 DIAGNOSIS — R110 Nausea: Secondary | ICD-10-CM | POA: Insufficient documentation

## 2013-09-04 NOTE — Progress Notes (Signed)
HPI :  Patient is a 68 year old female known to Dr. Sharlett Iles. She is s/p multiple abdominal surgeries with resulting short bowel syndrome. Patient has had nearly a total colectomy for adhesions. We also follow her for GERD/peptic strictures. EGD May 2011 revealed multiple distal esophageal strictures, status post [facility] dilation. Post dilation her swallowing was fine until a few weeks ago when she developed recurrent solid food dysphagia, mainly to meat. She has been on a PPI for years but stopped omeprazole 2 weeks ago because it was no longer effective (she was taking it twice daily. Now taking Zantac twice daily and Tums  as needed. In addition to dysphagia patient mentions frequent episodes of nausea over last few weeks. She has not started any new medications to account for the nausea. Her weight is stable. BMs are at baseline (loose)  Past Medical History  Diagnosis Date  . Obesity   . PUD (peptic ulcer disease)   . Leukopenia   . Short bowel syndrome   . Internal hemorrhoids without mention of complication   . Stricture and stenosis of esophagus   . Osteoarthrosis, unspecified whether generalized or localized, unspecified site   . Thyrotoxicosis without mention of goiter or other cause, without mention of thyrotoxic crisis or storm   . Other and unspecified hyperlipidemia   . Gout, unspecified   . Diverticulosis of colon (without mention of hemorrhage)   . C. difficile colitis   . VITAMIN B12 DEFICIENCY 08/30/2009  . GOITER, MULTINODULAR 04/02/2009  . HYPOTHYROIDISM, POST-RADIATION 08/13/2009  . ASYMPTOMATIC POSTMENOPAUSAL STATUS 10/11/2008  . Esophageal reflux 06/12/2008  . PONV (postoperative nausea and vomiting)   . Varicose veins   . Type II or unspecified type diabetes mellitus without mention of complication, not stated as uncontrolled     no med in years diet controled  . Blood transfusion   . UTI (urinary tract infection)   . Kidney stones     "several"  . Unspecified  essential hypertension   . Angina   . Shortness of breath on exertion     "sometimes"  . ANEMIA, IRON DEFICIENCY 05/08/2009  . H/O hiatal hernia   . Migraines   . Breast cancer 09/29/11    invasive grade III ductal ca,assoc high grade dcis,ER/PR=neg  . History of radiation therapy 02/08/12-03/25/12    left breast,total 61gy  . Blood transfusion without reported diagnosis    Family History  Problem Relation Age of Onset  . Esophageal cancer Son     deceased  . Cancer Son     Esophageal Cancer  . Diabetes Mother   . Heart disease Mother   . Colon cancer Paternal Uncle   . Cancer Paternal Uncle     Colon Cancer  . Kidney disease Sister   . Diabetes Father   . Hypertension Father   . Kidney disease Brother   . Kidney disease Brother   . Kidney disease Brother    History  Substance Use Topics  . Smoking status: Former Smoker -- 1.00 packs/day for 10 years    Types: Cigarettes    Quit date: 09/22/1985  . Smokeless tobacco: Never Used  . Alcohol Use: No     Comment: 09/29/11 "used to drink socially years ago"   Current Outpatient Prescriptions  Medication Sig Dispense Refill  . Calcium Carbonate-Vitamin D (CALCIUM-VITAMIN D) 600-200 MG-UNIT CAPS Take 1 capsule by mouth daily.        . cyanocobalamin (,VITAMIN B-12,) 1000 MCG/ML injection Inject 1,000 mcg  into the muscle every 30 (thirty) days. Next one due the 26th of this month      . ergocalciferol (VITAMIN D2) 50000 UNITS capsule Take 1 capsule (50,000 Units total) by mouth once a week.  4 capsule  3  . fluorometholone (FML) 0.1 % ophthalmic suspension Place 1 drop into both eyes 2 (two) times daily.      Marland Kitchen HYDROcodone-acetaminophen (NORCO) 10-325 MG per tablet Take 1 tablet by mouth every 6 (six) hours as needed.  100 tablet  0  . levothyroxine (SYNTHROID, LEVOTHROID) 150 MCG tablet Take 1 tablet (150 mcg total) by mouth daily before breakfast.  30 tablet  0  . lovastatin (MEVACOR) 20 MG tablet Take 1 tablet (20 mg total) by  mouth daily at 6 PM.  30 tablet  2  . magnesium chloride (SLOW-MAG) 64 MG TBEC Take 1 tablet by mouth daily.       . magnesium oxide (MAG-OX) 400 (241.3 MG) MG tablet Take 1 tablet (400 mg total) by mouth 3 (three) times daily.  30 tablet  1  . potassium chloride SA (K-DUR,KLOR-CON) 20 MEQ tablet Take 1 tablet (20 mEq total) by mouth daily.  30 tablet  1  . spironolactone (ALDACTONE) 25 MG tablet Take 25 mg by mouth daily.      Marland Kitchen thiamine 100 MG tablet Take 100 mg by mouth daily.        Marland Kitchen zolpidem (AMBIEN) 5 MG tablet Take 1 tablet (5 mg total) by mouth at bedtime as needed for sleep.  30 tablet  0  . calcium gluconate 650 MG tablet Take 1 tablet (650 mg total) by mouth daily.  30 tablet  2   No current facility-administered medications for this visit.   Facility-Administered Medications Ordered in Other Visits  Medication Dose Route Frequency Provider Last Rate Last Dose  . acetaminophen (TYLENOL) tablet 1,000 mg  1,000 mg Oral Once Amada Kingfisher, MD       Allergies  Allergen Reactions  . Aspirin Other (See Comments)    REACTION: Gi Intolerance/ Burning in stomach  . Codeine Itching  . Iodine Itching    Allergic to IVP dye  . Morphine And Related Itching   Review of Systems: Positive for allergies/sinus trouble, arthritis, back pain, fatigue, headaches, night sweats, shortness of breath, skin rash,  sore throat and excessive urination. All other systems reviewed and negative except where noted in HPI.    Physical Exam: BP 100/80  Pulse 72  Ht 5\' 5"  (1.651 m)  Wt 188 lb 9.6 oz (85.548 kg)  BMI 31.38 kg/m2 Constitutional: Pleasant,well-developed, black female in no acute distress. HEENT: Normocephalic and atraumatic. Conjunctivae are normal. No scleral icterus. Neck supple.  Cardiovascular: Normal rate, regular rhythm.  Pulmonary/chest: Effort normal and breath sounds normal. No wheezing, rales or rhonchi. Abdominal: Soft, nondistended, nontender. Bowel sounds active throughout.  There are no masses palpable. No hepatomegaly. Extremities: no edema Lymphadenopathy: No cervical adenopathy noted. Neurological: Alert and oriented to person place and time. Skin: Skin is warm and dry. No rashes noted. Psychiatric: Normal mood and affect. Behavior is normal.   ASSESSMENT AND PLAN:  1.GERD / history of peptic stricture dilated in 2011 Venia Minks). Now with recurrent solid food dysphagia. She discontined BID PPI a couple of weeks back, it wasn't helping pyrosis. For further evaluation of dysphagia patient will be scheduled for EGD with probable dilation. The benefits, risks, and potential complications of EGD with possible biopsies and/or dilation were discussed with the patient and  she agrees to proceed.  We discussed the importance of eating slowly, taking small bites of food, chewing well and consuming adequate amounts of fluid in between bites to avoid food impaction.Continue BID Zantac for now  2. Intermittent nausea, further evaluation at time of EGD.   3. History of invasive ductal carcinoma s/p chemotherapy + radiation.No evidence for recurrence.   4.  short bowel syndrome. History of multiple abdominal surgeries.

## 2013-09-04 NOTE — Patient Instructions (Signed)
You have been scheduled for an endoscopy with propofol Dr Fuller Plan. Please follow written instructions given to you at your visit today. If you use inhalers (even only as needed), please bring them with you on the day of your procedure. Your physician has requested that you go to www.[email or web address].[email or web address] and enter the access code given to you at your visit today. This web site gives a general overview about your procedure. However, you should still follow specific instructions given to you by our office regarding your preparation for the procedure.  Please call by 5 pm today to cancel.

## 2013-09-04 NOTE — Progress Notes (Signed)
Reviewed and agree with management plan.  Lux Skilton T. Ikram Riebe, MD FACG 

## 2013-09-05 ENCOUNTER — Other Ambulatory Visit: Payer: Self-pay

## 2013-09-05 ENCOUNTER — Encounter: Payer: Self-pay | Admitting: Gastroenterology

## 2013-09-05 ENCOUNTER — Ambulatory Visit: Payer: Medicare Other | Admitting: Gastroenterology

## 2013-09-05 VITALS — BP 113/63 | HR 62 | Temp 97.7°F | Resp 31 | Ht 65.0 in | Wt 188.0 lb

## 2013-09-05 DIAGNOSIS — K219 Gastro-esophageal reflux disease without esophagitis: Secondary | ICD-10-CM

## 2013-09-05 DIAGNOSIS — K2100 Gastro-esophageal reflux disease with esophagitis, without bleeding: Secondary | ICD-10-CM

## 2013-09-05 DIAGNOSIS — R1319 Other dysphagia: Secondary | ICD-10-CM

## 2013-09-05 MED ORDER — RANITIDINE HCL 300 MG PO TABS: 300.0000 mg | ORAL_TABLET | Freq: Every day | ORAL | Status: DC

## 2013-09-05 MED ORDER — PANTOPRAZOLE SODIUM 40 MG PO TBEC: DELAYED_RELEASE_TABLET | ORAL | Status: DC

## 2013-09-05 MED ORDER — SODIUM CHLORIDE 0.9 % IV SOLN: 500.0000 mL | INTRAVENOUS | Status: DC

## 2013-09-05 NOTE — Patient Instructions (Addendum)
YOU HAD AN ENDOSCOPIC PROCEDURE TODAY AT [facility]: Refer to the procedure report that was given to you for any specific questions about what was found during the examination.  If the procedure report does not answer your questions, please call your gastroenterologist to clarify.  If you requested that your care partner not be given the details of your procedure findings, then the procedure report has been included in a sealed envelope for you to review at your convenience later.  YOU SHOULD EXPECT: Some feelings of bloating in the abdomen. Passage of more gas than usual.  Walking can help get rid of the air that was put into your GI tract during the procedure and reduce the bloating.  DIET: FOLLOW DILATION DIET- SEE HANDOUT Drink plenty of fluids but you should avoid alcoholic beverages for 24 hours.  ACTIVITY: Your care partner should take you home directly after the procedure.  You should plan to take it easy, moving slowly for the rest of the day.  You can resume normal activity the day after the procedure however you should NOT DRIVE or use heavy machinery for 24 hours (because of the sedation medicines used during the test).    SYMPTOMS TO REPORT IMMEDIATELY: A gastroenterologist can be reached at any hour.  During normal business hours, 8:30 AM to 5:00 PM Monday through Friday, call [phone].  After hours and on weekends, please call the GI answering service at [phone] who will take a message and have the physician on call contact you.   Following upper endoscopy (EGD)  Vomiting of blood or coffee ground material  New chest pain or pain under the shoulder blades  Painful or persistently difficult swallowing  New shortness of breath  Fever of 100F or higher  Black, tarry-looking stools  FOLLOW UP: Our staff will call the home number listed on your records the next business day following your procedure to check on you and address any questions or concerns  that you may have at that time regarding the information given to you following your procedure. This is a courtesy call and so if there is no answer at the home number and we have not heard from you through the emergency physician on call, we will assume that you have returned to your regular daily activities without incident.  SIGNATURES/CONFIDENTIALITY: You and/or your care partner have signed paperwork which will be entered into your electronic medical record.  These signatures attest to the fact that that the information above on your After Visit Summary has been reviewed and is understood.  Full responsibility of the confidentiality of this discharge information lies with you and/or your care-partner.  Dr. [name] office nurse will call you to set up gastric emptying scan and return office visit for 6-8 weeks Please read over handouts about anti-reflux measures, esophagitis, and hiatal hernia  [name] recommends raising your bed with 4 inch bed blocks  Your prescriptions were sent to your pharmacy

## 2013-09-05 NOTE — Progress Notes (Signed)
Lidocaine-40mg IV prior to Propofol InductionPropofol given over incremental dosages 

## 2013-09-05 NOTE — Progress Notes (Signed)
Called to room to assist during endoscopic procedure.  Patient ID and intended procedure confirmed with present staff. Received instructions for my participation in the procedure from the performing physician.  

## 2013-09-05 NOTE — Op Note (Signed)
[place]  [address] [place], [id number]   ENDOSCOPY PROCEDURE REPORT  PATIENT: Rachel, [name]  MR#: [id number] BIRTHDATE: 01-31-1946 , 67  yrs. old GENDER: Female ENDOSCOPIST: Ladene Artist, MD, [facility] PROCEDURE DATE:  09/05/2013 PROCEDURE:   EGD with dilatation over guidewire ASA CLASS:   Class II INDICATIONS:dysphagia and GERD. MEDICATIONS: MAC sedation, administered by CRNA and propofol (Diprivan) 100mg  IV TOPICAL ANESTHETIC:   none DESCRIPTION OF PROCEDURE:   After the risks benefits and alternatives of the procedure were thoroughly explained, informed consent was obtained.  The     endoscope was introduced through the mouth  and advanced to the descending duodenum ,      The instrument was slowly withdrawn as the mucosa was carefully examined.   ESOPHAGUS: There was LA Class A esophagitis noted.   The esophagus was otherwise normal. STOMACH: The mucosa of the stomach appeared normal.  Small amount of retained gastric solids noted.  A small hiatus hernia was found. DUODENUM: The duodenal mucosa showed no abnormalities.   Dilation was then performed at the distal esophagus Dilator:Savary over guidewire Size:16 mm, 17 mm  Reststance:minimal Heme:none  COMPLICATIONS: There were no complications.  ENDOSCOPIC IMPRESSION: 1.   LA Class A esophagitis 2.   Small hiatus hernia  RECOMMENDATIONS: 1.  Anti-reflux regimen, 4 " bed blocks 2.  PPI bid: pantoprazole 40 mg po bid, 1 year 3.  H2RA hs: rantidine 300 mg po qh, 1 year supply 4.  Gastric emtpying scan 5.  REV in 6-8 weeks  eSigned:  Ladene Artist, MD, [facility] 09/05/2013 8:26 AM

## 2013-09-06 ENCOUNTER — Telehealth: Payer: Self-pay

## 2013-09-06 NOTE — Telephone Encounter (Signed)
  Follow up Call-  Call back number 09/05/2013  Post procedure Call Back phone  # [phone]  Permission to leave phone message Yes     Patient questions:  Do you have a fever, pain , or abdominal swelling? no Pain Score  0 *  Have you tolerated food without any problems? yes  Have you been able to return to your normal activities? yes  Do you have any questions about your discharge instructions: Diet   no Medications  no Follow up visit  no  Do you have questions or concerns about your Care? no  Actions: * If pain score is 4 or above: No action needed, pain <4.   No problems per the pt. Maw

## 2013-09-07 ENCOUNTER — Other Ambulatory Visit: Payer: Self-pay | Admitting: Endocrinology

## 2013-09-13 ENCOUNTER — Ambulatory Visit (HOSPITAL_COMMUNITY)
Admission: RE | Admit: 2013-09-13 | Discharge: 2013-09-13 | Disposition: A | Discharge status: Home / Self Care | Payer: Medicare Other | Source: Ambulatory Visit | Attending: Gastroenterology | Admitting: Gastroenterology

## 2013-09-13 ENCOUNTER — Other Ambulatory Visit: Payer: Self-pay

## 2013-09-13 DIAGNOSIS — C50319 Malignant neoplasm of lower-inner quadrant of unspecified female breast: Secondary | ICD-10-CM

## 2013-09-13 DIAGNOSIS — R1013 Epigastric pain: Principal | ICD-10-CM

## 2013-09-13 DIAGNOSIS — R109 Unspecified abdominal pain: Secondary | ICD-10-CM | POA: Insufficient documentation

## 2013-09-13 DIAGNOSIS — K3189 Other diseases of stomach and duodenum: Secondary | ICD-10-CM | POA: Insufficient documentation

## 2013-09-13 DIAGNOSIS — K219 Gastro-esophageal reflux disease without esophagitis: Secondary | ICD-10-CM

## 2013-09-13 MED ORDER — TECHNETIUM TC 99M SULFUR COLLOID: 2.2000 | Freq: Once | INTRAVENOUS | Status: AC | PRN

## 2013-09-14 ENCOUNTER — Ambulatory Visit (INDEPENDENT_AMBULATORY_CARE_PROVIDER_SITE_OTHER): Payer: Medicare Other | Admitting: Endocrinology

## 2013-09-14 ENCOUNTER — Other Ambulatory Visit (HOSPITAL_BASED_OUTPATIENT_CLINIC_OR_DEPARTMENT_OTHER): Payer: Medicare Other

## 2013-09-14 ENCOUNTER — Encounter: Payer: Self-pay | Admitting: Endocrinology

## 2013-09-14 ENCOUNTER — Encounter: Payer: Self-pay | Admitting: Gastroenterology

## 2013-09-14 VITALS — BP 112/86 | HR 65 | Temp 97.7°F | Ht 65.0 in | Wt 190.0 lb

## 2013-09-14 DIAGNOSIS — E538 Deficiency of other specified B group vitamins: Secondary | ICD-10-CM

## 2013-09-14 DIAGNOSIS — K3184 Gastroparesis: Secondary | ICD-10-CM

## 2013-09-14 DIAGNOSIS — Z0000 Encounter for general adult medical examination without abnormal findings: Secondary | ICD-10-CM

## 2013-09-14 DIAGNOSIS — C50319 Malignant neoplasm of lower-inner quadrant of unspecified female breast: Secondary | ICD-10-CM

## 2013-09-14 DIAGNOSIS — D649 Anemia, unspecified: Secondary | ICD-10-CM

## 2013-09-14 LAB — MAGNESIUM (CC13): Magnesium: 1.4 mg/dl — CL (ref 1.5–2.5)

## 2013-09-14 LAB — CBC WITH DIFFERENTIAL/PLATELET
BASO%: 0.3 % (ref 0.0–2.0)
BASOS ABS: 0 10*3/uL (ref 0.0–0.1)
EOS ABS: 0 10*3/uL (ref 0.0–0.5)
EOS%: 0.9 % (ref 0.0–7.0)
HCT: 30.1 % — ABNORMAL LOW (ref 34.8–46.6)
HEMOGLOBIN: 9.8 g/dL — AB (ref 11.6–15.9)
LYMPH%: 38.6 % (ref 14.0–49.7)
MCH: 29.3 pg (ref 25.1–34.0)
MCHC: 32.6 g/dL (ref 31.5–36.0)
MCV: 90.1 fL (ref 79.5–101.0)
MONO#: 0.3 10*3/uL (ref 0.1–0.9)
MONO%: 7.7 % (ref 0.0–14.0)
NEUT%: 52.5 % (ref 38.4–76.8)
NEUTROS ABS: 1.7 10*3/uL (ref 1.5–6.5)
Platelets: 158 10*3/uL (ref 145–400)
RBC: 3.34 10*6/uL — ABNORMAL LOW (ref 3.70–5.45)
RDW: 13.2 % (ref 11.2–14.5)
WBC: 3.2 10*3/uL — ABNORMAL LOW (ref 3.9–10.3)
lymph#: 1.3 10*3/uL (ref 0.9–3.3)

## 2013-09-14 LAB — COMPREHENSIVE METABOLIC PANEL (CC13)
ALBUMIN: 3.7 g/dL (ref 3.5–5.0)
ALK PHOS: 64 U/L (ref 40–150)
ALT: 18 U/L (ref 0–55)
ANION GAP: 8 meq/L (ref 3–11)
AST: 25 U/L (ref 5–34)
BILIRUBIN TOTAL: 0.8 mg/dL (ref 0.20–1.20)
BUN: 19.8 mg/dL (ref 7.0–26.0)
CO2: 24 meq/L (ref 22–29)
Calcium: 9.5 mg/dL (ref 8.4–10.4)
Chloride: 107 mEq/L (ref 98–109)
Creatinine: 1 mg/dL (ref 0.6–1.1)
Glucose: 100 mg/dl (ref 70–140)
POTASSIUM: 3.2 meq/L — AB (ref 3.5–5.1)
SODIUM: 140 meq/L (ref 136–145)
Total Protein: 7.1 g/dL (ref 6.4–8.3)

## 2013-09-14 MED ORDER — CYANOCOBALAMIN 1000 MCG/ML IJ SOLN
1000.0000 ug | Freq: Once | INTRAMUSCULAR | Status: AC
  Administered 2013-09-14: 1000 ug via INTRAMUSCULAR

## 2013-09-14 MED ORDER — ZOLPIDEM TARTRATE 5 MG PO TABS: 5.0000 mg | ORAL_TABLET | Freq: Every evening | ORAL | Status: DC | PRN

## 2013-09-14 MED ORDER — HYDROCODONE-ACETAMINOPHEN 10-325 MG PO TABS: 1.0000 | ORAL_TABLET | Freq: Four times a day (QID) | ORAL | Status: DC | PRN

## 2013-09-14 NOTE — Patient Instructions (Signed)
please consider these measures for your health:  minimize alcohol.  do not use tobacco products.  have a colonoscopy at least every 10 years from age 70.  Women should have an annual mammogram from age 40.  keep firearms safely stored.  always use seat belts.  have working smoke alarms in your home.  see an eye doctor and dentist regularly.  never drive under the influence of alcohol or drugs (including prescription drugs).   it is critically important to prevent falling down (keep floor areas well-lit, dry, and free of loose objects.  If you have a cane, walker, or wheelchair, you should use it, even for short trips around the house.  Also, try not to rush).  Please come back for a follow-up appointment in 6 months.   

## 2013-09-14 NOTE — Progress Notes (Signed)
Subjective:    Patient ID: [name], female    DOB: [birth year], 68 y.o.   MRN: [id number]  HPI Pt is here for regular wellness examination, and is feeling pretty well in general, and says chronic med probs are stable, except as noted below Past Medical History  Diagnosis Date  . Obesity   . PUD (peptic ulcer disease)   . Leukopenia   . Short bowel syndrome   . Internal hemorrhoids without mention of complication   . Stricture and stenosis of esophagus   . Osteoarthrosis, unspecified whether generalized or localized, unspecified site   . Thyrotoxicosis without mention of goiter or other cause, without mention of thyrotoxic crisis or storm   . Other and unspecified hyperlipidemia   . Gout, unspecified   . Diverticulosis of colon (without mention of hemorrhage)   . C. difficile colitis   . VITAMIN B12 DEFICIENCY 08/30/2009  . GOITER, MULTINODULAR 04/02/2009  . HYPOTHYROIDISM, POST-RADIATION 08/13/2009  . ASYMPTOMATIC POSTMENOPAUSAL STATUS 10/11/2008  . Esophageal reflux 06/12/2008  . PONV (postoperative nausea and vomiting)   . Varicose veins   . Type II or unspecified type diabetes mellitus without mention of complication, not stated as uncontrolled     no med in years diet controled  . Blood transfusion   . UTI (urinary tract infection)   . Kidney stones     "several"  . Unspecified essential hypertension   . Angina   . Shortness of breath on exertion     "sometimes"  . ANEMIA, IRON DEFICIENCY 05/08/2009  . History of lower GI bleeding   . H/O hiatal hernia   . Migraines   . Breast cancer 09/29/11    invasive grade III ductal ca,assoc high grade dcis,ER/PR=neg  . History of radiation therapy 02/08/12-03/25/12    left breast,total 61gy  . Blood transfusion without reported diagnosis   . Hypomagnesemia   . Hypokalemia 05/11/2013    Past Surgical History  Procedure Laterality Date  . Vein ligation and stripping  1980's    Right leg  . Lithotripsy      "4 or 5  times"  . Bunionectomy  1970's    bilateral  . Colonoscopy  2012    multiple   . Esophagogastroduodenoscopy  2011    multiple   . Thyroid ultrasound  12/1994 and 12/1995  . Mastectomy w/ nodes partial  09/29/11    left  . Port a cath placement  09/29/11    right chest  . Breast surgery    . Cholecystectomy  1990's  . Abdominal hysterectomy  1970's    with BSO  . Dilation and curettage of uterus    . Colon surgery      "several surgeries for short bowel syndrome"  . Abdominal adhesion surgery  1980's thru 1990's    "several"  . Kidney stone surgery  1990's    "tried to go up & get it but pushed it further up"  . Portacath placement  09/29/2011    Procedure: INSERTION PORT-A-CATH;  Surgeon: Adin Hector, MD;  Location: [facility];  Service: General;  Laterality: N/A;  . Breast biopsy  08/13/11    left breast lower inner quadrant  . Breast lumpectomy w/ needle localization  09/29/11    left  breast=lymph node,excision benign/ ER/PR=neg, her 2 Positive  . Flexible sigmoidoscopy  2011    multiple     History   Social History  . Marital Status: Married    Spouse Name:  N/A    Number of Children: 2  . Years of Education: N/A   Occupational History  . Not on file.   Social History Main Topics  . Smoking status: Former Smoker -- 1.00 packs/day for 10 years    Types: Cigarettes    Quit date: 09/22/1985  . Smokeless tobacco: Never Used  . Alcohol Use: No     Comment: 09/29/11 "used to drink socially years ago"  . Drug Use: No  . Sexual Activity: No     Comment: HRT x many yrs   Other Topics Concern  . Not on file   Social History Narrative   Pt gets regular exercise    Current Outpatient Prescriptions on File Prior to Visit  Medication Sig Dispense Refill  . calcium carbonate (TUMS - DOSED IN MG ELEMENTAL CALCIUM) 500 MG chewable tablet Chew 1 tablet by mouth as needed for indigestion or heartburn.      . Calcium Carbonate-Vitamin D (CALCIUM-VITAMIN D) 600-200 MG-UNIT CAPS  Take 1 capsule by mouth daily.        . cyanocobalamin (,VITAMIN B-12,) 1000 MCG/ML injection Inject 1,000 mcg into the muscle every 30 (thirty) days. Next one due the 26th of this month      . ergocalciferol (VITAMIN D2) 50000 UNITS capsule Take 1 capsule (50,000 Units total) by mouth once a week.  4 capsule  3  . fluorometholone (FML) 0.1 % ophthalmic suspension Place 1 drop into both eyes 2 (two) times daily.      Marland Kitchen levothyroxine (SYNTHROID, LEVOTHROID) 150 MCG tablet TAKE 1 TABLET (150 MCG TOTAL) BY MOUTH DAILY BEFORE BREAKFAST.  30 tablet  0  . lovastatin (MEVACOR) 20 MG tablet Take 1 tablet (20 mg total) by mouth daily at 6 PM.  30 tablet  2  . magnesium chloride (SLOW-MAG) 64 MG TBEC Take 1 tablet by mouth daily.       . magnesium oxide (MAG-OX) 400 (241.3 MG) MG tablet Take 1 tablet (400 mg total) by mouth 3 (three) times daily.  30 tablet  1  . pantoprazole (PROTONIX) 40 MG tablet Take 1 tablet 30 minutes before breakfast and supper  60 tablet  11  . potassium chloride SA (K-DUR,KLOR-CON) 20 MEQ tablet Take 1 tablet (20 mEq total) by mouth daily.  30 tablet  1  . ranitidine (ZANTAC) 300 MG tablet Take 1 tablet (300 mg total) by mouth at bedtime.  30 tablet  11  . spironolactone (ALDACTONE) 25 MG tablet Take 25 mg by mouth daily.      Marland Kitchen thiamine 100 MG tablet Take 100 mg by mouth daily.        . calcium gluconate 650 MG tablet Take 1 tablet (650 mg total) by mouth daily.  30 tablet  2   Current Facility-Administered Medications on File Prior to Visit  Medication Dose Route Frequency Provider Last Rate Last Dose  . acetaminophen (TYLENOL) tablet 1,000 mg  1,000 mg Oral Once Amada Kingfisher, MD        Allergies  Allergen Reactions  . Aspirin Other (See Comments)    REACTION: Gi Intolerance/ Burning in stomach  . Codeine Itching  . Iodine Itching    Allergic to IVP dye  . Morphine And Related Itching    Family History  Problem Relation Age of Onset  . Esophageal cancer Son      deceased  . Cancer Son     Esophageal Cancer  . Diabetes Mother   . Heart disease Mother   .  Colon cancer Paternal Uncle   . Cancer Paternal Uncle     Colon Cancer  . Kidney disease Sister   . Diabetes Father   . Hypertension Father   . Kidney disease Brother   . Kidney disease Brother   . Kidney disease Brother     BP 112/86  Pulse 65  Temp(Src) 97.7 F (36.5 C) (Oral)  Ht 5\' 5"  (1.651 m)  Wt 190 lb (86.183 kg)  BMI 31.62 kg/m2  SpO2 98%   Review of Systems  Constitutional: Negative for fever.       She has lost a few lbs  HENT: Negative for hearing loss.   Eyes: Negative for visual disturbance.  Respiratory: Negative for shortness of breath.   Cardiovascular: Negative for chest pain.  Gastrointestinal:       She has intermittent slight brbpr  Endocrine: Positive for cold intolerance.  Genitourinary: Negative for hematuria.  Musculoskeletal: Positive for back pain.  Skin: Negative for rash.  Allergic/Immunologic: Negative for environmental allergies.  Neurological: Negative for syncope.  Hematological: Does not bruise/bleed easily.  Psychiatric/Behavioral: Negative for dysphoric mood.       Objective:   Physical Exam VS: see vs page GEN: no distress HEAD: head: no deformity eyes: no periorbital swelling, no proptosis external nose and ears are normal mouth: no lesion seen NECK: small multinodular goiter CHEST WALL: no deformity LUNGS:  Clear to auscultation CV: reg rate and rhythm, no murmur ABD: abdomen is soft, nontender.  no hepatosplenomegaly.  not distended.  no hernia MUSCULOSKELETAL: muscle bulk and strength are grossly normal.  no obvious joint swelling.  gait is normal and stead PULSES: no carotid bruit NEURO:  cn 2-12 grossly intact.   readily moves all 4's.   SKIN:  Normal texture and temperature.  No rash or suspicious lesion is visible.   NODES:  None palpable at the neck PSYCH: alert, well-oriented.  Does not appear anxious nor  depressed.       Assessment & Plan:  Wellness visit today, with problems stable, except as noted. we discussed code status.  pt requests full code, but would not want to be started or maintained on artificial life-support measures if there was not a reasonable chance of recovery.

## 2013-09-16 ENCOUNTER — Other Ambulatory Visit (HOSPITAL_COMMUNITY): Payer: Self-pay | Admitting: Internal Medicine

## 2013-09-18 ENCOUNTER — Other Ambulatory Visit: Payer: Self-pay | Admitting: *Deleted

## 2013-09-18 ENCOUNTER — Telehealth: Payer: Self-pay | Admitting: *Deleted

## 2013-09-18 NOTE — Telephone Encounter (Signed)
Called pt to inform her of lab results(mag, potassium). Advised pt to take Slow Mag 64 mg 1 tablet  twice a day and Potassium 20 mEq 1 tablet twice a day x 5 days starting today then resume back to usual regimen per Charlestine Massed, NP. Pt will come in 09/25/13 for repeat labs BMP and Mag. Pt verbalized understanding. No further concerns. Message to be forwarded to Charlestine Massed, NP.

## 2013-09-18 NOTE — Telephone Encounter (Signed)
Message copied by Harmon Pier on Mon Sep 18, 2013  1:18 PM ------      Message from: Minette Headland      Created: Sat Sep 16, 2013 10:46 AM       Please call patient and ask her if she is taking potassium or magnesium.  Is she having any diarrhea?            Thanks,       LC       ----- Message -----         From: [facility]         Sent: 09/14/2013  11:52 AM           To: Deatra Robinson, MD                   ------

## 2013-09-19 ENCOUNTER — Telehealth: Payer: Self-pay | Admitting: Oncology

## 2013-09-19 ENCOUNTER — Other Ambulatory Visit: Payer: Self-pay | Admitting: *Deleted

## 2013-09-19 NOTE — Telephone Encounter (Signed)
s.w.l pt husband and advised on April appt...ok and aware

## 2013-09-22 ENCOUNTER — Other Ambulatory Visit: Payer: Self-pay | Admitting: *Deleted

## 2013-09-22 DIAGNOSIS — C50319 Malignant neoplasm of lower-inner quadrant of unspecified female breast: Secondary | ICD-10-CM

## 2013-09-22 DIAGNOSIS — C50919 Malignant neoplasm of unspecified site of unspecified female breast: Secondary | ICD-10-CM

## 2013-09-25 ENCOUNTER — Other Ambulatory Visit (HOSPITAL_BASED_OUTPATIENT_CLINIC_OR_DEPARTMENT_OTHER): Payer: Medicare Other

## 2013-09-25 DIAGNOSIS — C50919 Malignant neoplasm of unspecified site of unspecified female breast: Secondary | ICD-10-CM

## 2013-09-25 DIAGNOSIS — C50319 Malignant neoplasm of lower-inner quadrant of unspecified female breast: Secondary | ICD-10-CM

## 2013-09-25 LAB — CBC WITH DIFFERENTIAL/PLATELET
BASO%: 0.4 % (ref 0.0–2.0)
Basophils Absolute: 0 10*3/uL (ref 0.0–0.1)
EOS%: 1.7 % (ref 0.0–7.0)
Eosinophils Absolute: 0.1 10*3/uL (ref 0.0–0.5)
HEMATOCRIT: 31.1 % — AB (ref 34.8–46.6)
HGB: 10.1 g/dL — ABNORMAL LOW (ref 11.6–15.9)
LYMPH%: 31.9 % (ref 14.0–49.7)
MCH: 30.1 pg (ref 25.1–34.0)
MCHC: 32.6 g/dL (ref 31.5–36.0)
MCV: 92.4 fL (ref 79.5–101.0)
MONO#: 0.3 10*3/uL (ref 0.1–0.9)
MONO%: 8.4 % (ref 0.0–14.0)
NEUT#: 1.9 10*3/uL (ref 1.5–6.5)
NEUT%: 57.6 % (ref 38.4–76.8)
PLATELETS: 200 10*3/uL (ref 145–400)
RBC: 3.37 10*6/uL — AB (ref 3.70–5.45)
RDW: 13.3 % (ref 11.2–14.5)
WBC: 3.4 10*3/uL — ABNORMAL LOW (ref 3.9–10.3)
lymph#: 1.1 10*3/uL (ref 0.9–3.3)

## 2013-09-25 LAB — COMPREHENSIVE METABOLIC PANEL (CC13)
ALT: 19 U/L (ref 0–55)
ANION GAP: 11 meq/L (ref 3–11)
AST: 26 U/L (ref 5–34)
Albumin: 3.6 g/dL (ref 3.5–5.0)
Alkaline Phosphatase: 63 U/L (ref 40–150)
BILIRUBIN TOTAL: 0.95 mg/dL (ref 0.20–1.20)
BUN: 16 mg/dL (ref 7.0–26.0)
CO2: 24 meq/L (ref 22–29)
CREATININE: 1.2 mg/dL — AB (ref 0.6–1.1)
Calcium: 9.8 mg/dL (ref 8.4–10.4)
Chloride: 108 mEq/L (ref 98–109)
GLUCOSE: 113 mg/dL (ref 70–140)
Potassium: 4.3 mEq/L (ref 3.5–5.1)
SODIUM: 142 meq/L (ref 136–145)
TOTAL PROTEIN: 6.9 g/dL (ref 6.4–8.3)

## 2013-09-26 ENCOUNTER — Other Ambulatory Visit: Payer: Self-pay | Admitting: *Deleted

## 2013-09-27 ENCOUNTER — Telehealth: Payer: Self-pay | Admitting: *Deleted

## 2013-09-27 NOTE — Telephone Encounter (Signed)
Patient called asking about Monday's lab results.  Informed her they are stable with no substantial changes from 1 week ago.

## 2013-10-03 ENCOUNTER — Ambulatory Visit
Admission: RE | Admit: 2013-10-03 | Discharge: 2013-10-03 | Disposition: A | Discharge status: Home / Self Care | Payer: Medicare Other | Source: Ambulatory Visit | Attending: Hematology and Oncology | Admitting: Hematology and Oncology

## 2013-10-03 DIAGNOSIS — D649 Anemia, unspecified: Secondary | ICD-10-CM

## 2013-10-03 DIAGNOSIS — C50319 Malignant neoplasm of lower-inner quadrant of unspecified female breast: Secondary | ICD-10-CM

## 2013-10-03 DIAGNOSIS — R531 Weakness: Secondary | ICD-10-CM

## 2013-10-13 ENCOUNTER — Ambulatory Visit (INDEPENDENT_AMBULATORY_CARE_PROVIDER_SITE_OTHER): Payer: Medicare Other

## 2013-10-13 DIAGNOSIS — E538 Deficiency of other specified B group vitamins: Secondary | ICD-10-CM

## 2013-10-13 DIAGNOSIS — E119 Type 2 diabetes mellitus without complications: Secondary | ICD-10-CM

## 2013-10-13 MED ORDER — CYANOCOBALAMIN 1000 MCG/ML IJ SOLN
1000.0000 ug | Freq: Once | INTRAMUSCULAR | Status: AC
  Administered 2013-10-13: 1000 ug via INTRAMUSCULAR

## 2013-10-24 ENCOUNTER — Ambulatory Visit: Payer: Medicare Other | Admitting: Gastroenterology

## 2013-10-27 ENCOUNTER — Other Ambulatory Visit: Payer: Self-pay | Admitting: Endocrinology

## 2013-10-29 ENCOUNTER — Emergency Department (HOSPITAL_COMMUNITY)
Admission: EM | Admit: 2013-10-29 | Discharge: 2013-10-29 | Disposition: A | Discharge status: Home / Self Care | Payer: Medicare Other | Source: Home / Self Care | Attending: Family Medicine | Admitting: Family Medicine

## 2013-10-29 ENCOUNTER — Encounter (HOSPITAL_COMMUNITY): Payer: Self-pay | Admitting: Emergency Medicine

## 2013-10-29 ENCOUNTER — Emergency Department (INDEPENDENT_AMBULATORY_CARE_PROVIDER_SITE_OTHER): Payer: Medicare Other

## 2013-10-29 DIAGNOSIS — M722 Plantar fascial fibromatosis: Secondary | ICD-10-CM

## 2013-10-29 NOTE — Discharge Instructions (Signed)
Ice, heel cups, gel inserts, no barefoot walking, see dr Stana Bunting for follow-up.

## 2013-10-29 NOTE — ED Notes (Signed)
Reports 2-3 week history of right calf tightness and soreness.  Friday night patient reports pain in heel increased Friday and includes outside of ankle too.  No known injury

## 2013-10-29 NOTE — ED Provider Notes (Signed)
CSN: [id number]     Arrival date & time 10/29/13  1123 History   First MD Initiated Contact with Patient 10/29/13 1201     Chief Complaint  Patient presents with  . Foot Pain   (Consider location/radiation/quality/duration/timing/severity/associated sxs/prior Treatment) Patient is a 68 y.o. female presenting with lower extremity pain. The history is provided by the patient and the spouse.  Foot Pain This is a new problem. The current episode started 2 days ago. The problem has been gradually worsening. The symptoms are aggravated by walking.    Past Medical History  Diagnosis Date  . Obesity   . PUD (peptic ulcer disease)   . Leukopenia   . Short bowel syndrome   . Internal hemorrhoids without mention of complication   . Stricture and stenosis of esophagus   . Osteoarthrosis, unspecified whether generalized or localized, unspecified site   . Thyrotoxicosis without mention of goiter or other cause, without mention of thyrotoxic crisis or storm   . Other and unspecified hyperlipidemia   . Gout, unspecified   . Diverticulosis of colon (without mention of hemorrhage)   . C. difficile colitis   . VITAMIN B12 DEFICIENCY 08/30/2009  . GOITER, MULTINODULAR 04/02/2009  . HYPOTHYROIDISM, POST-RADIATION 08/13/2009  . ASYMPTOMATIC POSTMENOPAUSAL STATUS 10/11/2008  . Esophageal reflux 06/12/2008  . PONV (postoperative nausea and vomiting)   . Varicose veins   . Type II or unspecified type diabetes mellitus without mention of complication, not stated as uncontrolled     no med in years diet controled  . Blood transfusion   . UTI (urinary tract infection)   . Kidney stones     "several"  . Unspecified essential hypertension   . Angina   . Shortness of breath on exertion     "sometimes"  . ANEMIA, IRON DEFICIENCY 05/08/2009  . History of lower GI bleeding   . H/O hiatal hernia   . Migraines   . Breast cancer 09/29/11    invasive grade III ductal ca,assoc high grade dcis,ER/PR=neg  . History  of radiation therapy 02/08/12-03/25/12    left breast,total 59gy  . Blood transfusion without reported diagnosis   . Hypomagnesemia   . Hypokalemia 05/11/2013  . Gastroparesis    Past Surgical History  Procedure Laterality Date  . Vein ligation and stripping  1980's    Right leg  . Lithotripsy      "4 or 5 times"  . Bunionectomy  1970's    bilateral  . Colonoscopy  2012    multiple   . Esophagogastroduodenoscopy  2011    multiple   . Thyroid ultrasound  12/1994 and 12/1995  . Mastectomy w/ nodes partial  09/29/11    left  . Port a cath placement  09/29/11    right chest  . Breast surgery    . Cholecystectomy  1990's  . Abdominal hysterectomy  1970's    with BSO  . Dilation and curettage of uterus    . Colon surgery      "several surgeries for short bowel syndrome"  . Abdominal adhesion surgery  1980's thru 1990's    "several"  . Kidney stone surgery  1990's    "tried to go up & get it but pushed it further up"  . Portacath placement  09/29/2011    Procedure: INSERTION PORT-A-CATH;  Surgeon: Adin Hector, MD;  Location: [facility];  Service: General;  Laterality: N/A;  . Breast biopsy  08/13/11    left breast lower inner quadrant  .  Breast lumpectomy w/ needle localization  09/29/11    left  breast=lymph node,excision benign/ ER/PR=neg, her 2 Positive  . Flexible sigmoidoscopy  2011    multiple    Family History  Problem Relation Age of Onset  . Esophageal cancer Son     deceased  . Cancer Son     Esophageal Cancer  . Diabetes Mother   . Heart disease Mother   . Colon cancer Paternal Uncle   . Cancer Paternal Uncle     Colon Cancer  . Kidney disease Sister   . Diabetes Father   . Hypertension Father   . Kidney disease Brother   . Kidney disease Brother   . Kidney disease Brother    History  Substance Use Topics  . Smoking status: Former Smoker -- 1.00 packs/day for 10 years    Types: Cigarettes    Quit date: 09/22/1985  . Smokeless tobacco: Never Used  .  Alcohol Use: No     Comment: 09/29/11 "used to drink socially years ago"   OB History   Grav Para Term Preterm Abortions TAB SAB Ect Mult Living                 Review of Systems  Constitutional: Negative.   Cardiovascular: Negative for leg swelling.  Musculoskeletal: Positive for gait problem. Negative for joint swelling.  Skin: Negative.     Allergies  Aspirin; Codeine; Iodine; and Morphine and related  Home Medications   Prior to Admission medications   Medication Sig Start Date End Date Taking? Authorizing Provider  calcium carbonate (TUMS - DOSED IN MG ELEMENTAL CALCIUM) 500 MG chewable tablet Chew 1 tablet by mouth as needed for indigestion or heartburn.    Historical Provider, MD  Calcium Carbonate-Vitamin D (CALCIUM-VITAMIN D) 600-200 MG-UNIT CAPS Take 1 capsule by mouth daily.      Historical Provider, MD  calcium gluconate 650 MG tablet Take 1 tablet (650 mg total) by mouth daily. 01/14/12 01/13/13  Deatra Robinson, MD  cyanocobalamin (,VITAMIN B-12,) 1000 MCG/ML injection Inject 1,000 mcg into the muscle every 30 (thirty) days. Next one due the 26th of this month    Historical Provider, MD  ergocalciferol (VITAMIN D2) 50000 UNITS capsule Take 1 capsule (50,000 Units total) by mouth once a week. 08/10/13   Amada Kingfisher, MD  fluorometholone (FML) 0.1 % ophthalmic suspension Place 1 drop into both eyes 2 (two) times daily. 03/14/13   Historical Provider, MD  HYDROcodone-acetaminophen (NORCO) 10-325 MG per tablet Take 1 tablet by mouth every 6 (six) hours as needed. 09/14/13   Renato Shin, MD  levothyroxine (SYNTHROID, LEVOTHROID) 150 MCG tablet TAKE 1 TABLET BY MOUTH DAILY BEFORE BREAKFAST. 10/27/13   Renato Shin, MD  lovastatin (MEVACOR) 20 MG tablet Take 1 tablet (20 mg total) by mouth daily at 6 PM. 07/31/13   Renato Shin, MD  magnesium chloride (SLOW-MAG) 64 MG TBEC Take 1 tablet by mouth daily.     Historical Provider, MD  pantoprazole (PROTONIX) 40 MG tablet Take 1 tablet 30  minutes before breakfast and supper 09/05/13   Ladene Artist, MD  potassium chloride SA (K-DUR,KLOR-CON) 20 MEQ tablet Take 1 tablet (20 mEq total) by mouth daily. 08/10/13   Amada Kingfisher, MD  ranitidine (ZANTAC) 300 MG tablet Take 1 tablet (300 mg total) by mouth at bedtime. 09/05/13   Ladene Artist, MD  spironolactone (ALDACTONE) 25 MG tablet Take 25 mg by mouth daily.    Historical Provider, MD  thiamine  100 MG tablet Take 100 mg by mouth daily.      Historical Provider, MD  zolpidem (AMBIEN) 5 MG tablet Take 1 tablet (5 mg total) by mouth at bedtime as needed for sleep. 09/14/13   Renato Shin, MD   BP 108/64  Pulse 69  Temp(Src) 98 F (36.7 C) (Oral)  Resp 16  SpO2 100% Physical Exam  Nursing note and vitals reviewed. Constitutional: She is oriented to person, place, and time. She appears well-developed and well-nourished. She appears distressed.  Musculoskeletal: She exhibits tenderness.       Right foot: She exhibits tenderness and bony tenderness.       Feet:  Neurological: She is alert and oriented to person, place, and time.  Skin: Skin is warm and dry.    ED Course  Procedures (including critical care time) Labs Review Labs Reviewed - No data to display  Imaging Review Dg Os Calcis Right  10/29/2013   CLINICAL DATA:  Heel pain  EXAM: RIGHT OS CALCIS - 2+ VIEW  COMPARISON:  None.  FINDINGS: No acute fracture is noted. Calcaneal and plantar spurs are seen. No soft tissue changes are noted.  IMPRESSION: Degenerative change without acute abnormality.   Electronically Signed   By: Inez Catalina M.D.   On: 10/29/2013 13:05    X-rays reviewed and report per radiologist.  MDM   1. Plantar fasciitis of right foot        Billy Fischer, MD 10/29/13 1326

## 2013-11-06 ENCOUNTER — Telehealth: Payer: Self-pay

## 2013-11-06 MED ORDER — MAGNESIUM CHLORIDE 64 MG PO TBEC: 1.0000 | DELAYED_RELEASE_TABLET | Freq: Every day | ORAL | Status: DC

## 2013-11-06 NOTE — Telephone Encounter (Signed)
i have sent a prescription to your pharmacy, to refill

## 2013-11-06 NOTE — Telephone Encounter (Signed)
Received a refill request for Magnesium Oxide 400 mg. Medication is not on current medication list. Please advise if ok to refill. Thanks!

## 2013-11-13 ENCOUNTER — Ambulatory Visit: Payer: Medicare Other

## 2013-11-15 ENCOUNTER — Other Ambulatory Visit: Payer: Self-pay | Admitting: *Deleted

## 2013-11-15 MED ORDER — LOVASTATIN 20 MG PO TABS: 20.0000 mg | ORAL_TABLET | Freq: Every day | ORAL | Status: DC

## 2013-11-17 ENCOUNTER — Ambulatory Visit (INDEPENDENT_AMBULATORY_CARE_PROVIDER_SITE_OTHER): Payer: Medicare Other | Admitting: *Deleted

## 2013-11-17 DIAGNOSIS — E538 Deficiency of other specified B group vitamins: Secondary | ICD-10-CM

## 2013-11-17 MED ORDER — CYANOCOBALAMIN 1000 MCG/ML IJ SOLN
1000.0000 ug | Freq: Once | INTRAMUSCULAR | Status: AC
  Administered 2013-11-17: 1000 ug via INTRAMUSCULAR

## 2013-11-19 ENCOUNTER — Other Ambulatory Visit: Payer: Self-pay | Admitting: Hematology and Oncology

## 2013-11-22 ENCOUNTER — Other Ambulatory Visit: Payer: Self-pay | Admitting: *Deleted

## 2013-11-22 NOTE — Telephone Encounter (Signed)
Declined refill and faxed back to [facility]. Was filled on 11/06/13 by PCP, Dr. Renato Shin with new directions. Has no follow up scheduled at the [facility].

## 2013-11-24 ENCOUNTER — Telehealth: Payer: Self-pay | Admitting: *Deleted

## 2013-11-24 DIAGNOSIS — C50919 Malignant neoplasm of unspecified site of unspecified female breast: Secondary | ICD-10-CM

## 2013-11-24 NOTE — Telephone Encounter (Signed)
Patient calling stating her left arm looks like "jelly". She noticed this after her shower today. It is only her left arm, above the elbow. Denies any swelling below the elbow, no swelling of left hand either. No complaints of pain, redness, tenderness or lumps/bumps/rash. No changes in detergents and no unusual sores or bug bites to this area. Reviewed all patient history and meds. Patient has not been taking her Spironolactone lately, she was instructed by her primary to stop. She also informed this nurse she has been loosing weight, and has been seeing the doctor for that as she has GI issues. Patient was instructed to rest arm today and elevate. If she notices any additional swelling or pain develops, she is to call Dr Loanne Drilling or our office immediately for further evaluation. Due to Dr Humphrey Rolls leaving our practice, patient will be scheduled for her routine follow up with Charlestine Massed, NP in September 2015. Patient states that when she was here in 07/2013, she requested PAC removal but has not been contacted fromm [facility] yet. Will send POF for patient appts for scheduling. Patient verbalized understanding in plan of care.

## 2013-11-27 ENCOUNTER — Telehealth: Payer: Self-pay | Admitting: Oncology

## 2013-11-27 NOTE — Telephone Encounter (Signed)
s.w. pt and advised on Sept appt pt ok and aware °

## 2013-11-28 ENCOUNTER — Other Ambulatory Visit (INDEPENDENT_AMBULATORY_CARE_PROVIDER_SITE_OTHER): Payer: Self-pay | Admitting: General Surgery

## 2013-11-28 ENCOUNTER — Other Ambulatory Visit: Payer: Self-pay | Admitting: Adult Health

## 2013-11-28 NOTE — Progress Notes (Signed)
Received message from Bary Castilla, RN requesting patient receive clearance for port removal.  Patient is cleared for port removal.  I have sent an inbasket message to Dr. Dalbert Batman as well.  Minette Headland, [place] [phone]

## 2013-12-02 ENCOUNTER — Other Ambulatory Visit: Payer: Self-pay | Admitting: Endocrinology

## 2013-12-09 ENCOUNTER — Other Ambulatory Visit: Payer: Self-pay | Admitting: Endocrinology

## 2013-12-11 ENCOUNTER — Telehealth: Payer: Self-pay

## 2013-12-11 MED ORDER — HYDROCODONE-ACETAMINOPHEN 10-325 MG PO TABS: 1.0000 | ORAL_TABLET | Freq: Four times a day (QID) | ORAL | Status: DC | PRN

## 2013-12-11 NOTE — Telephone Encounter (Signed)
Pt requesting a refill for Hydrocodone. Please advise, Thanks!

## 2013-12-11 NOTE — Telephone Encounter (Signed)
Patient would like to know if she could get something for pain until her next follow up visit.

## 2013-12-11 NOTE — Telephone Encounter (Signed)
i printed 

## 2013-12-12 NOTE — Telephone Encounter (Signed)
Pt advised that rx is ready for pick up. Script placed up front.  

## 2013-12-14 NOTE — Progress Notes (Signed)
To bring meds and ins card and id

## 2013-12-15 ENCOUNTER — Other Ambulatory Visit: Payer: Self-pay

## 2013-12-15 MED ORDER — MAGNESIUM CHLORIDE 64 MG PO TBEC: 1.0000 | DELAYED_RELEASE_TABLET | Freq: Every day | ORAL | Status: DC

## 2013-12-18 NOTE — H&P (Signed)
  History: This patient desires removal of port a cath On 09/29/2011 she underwent left partial mastectomy, sentinel node biopsy and Port-A-Cath insertion. 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 Pathologic stage TI B. N0.  Following adjuvant chemotherapy she underwent radiation therapy the radiation therapy was completed in November.  She received Herceptin chemotherapy.  She states that after the radiation therapy she developed swelling in the left breast. This has concerned her. There is no arm swelling or shoulder symptoms.  A left breast mammogram on 10/03/2013  is completely benign.   PH, FH, SH and ROS documented on chart, unchanged, non contributary. Has gastroparesis. As short bowel syndrome, s/p several small bowel resections for SBO. HGas chronic abdominal pain. Thyroid disease (Dr. Loanne Drilling).  Exam:. She has a flattened affect.  She is in no distress.  Neck no adenopathy or masses no JVD  Breasts left breast is examined. It is moderately large. Perhaps slight edema but no erythema, no sig. tenderness, wounds well healed. No focal area of thickening. No focal hematoma or seroma. No apparent surgical problems.   Assessment: Left breast swelling, probably sterile inflammatory changes related to radiation therapy, at least by history. There is no surgical problem or fluid collection that requires drainage. Suspect symptoms will be self-limited  Invasive ductal carcinoma left breast, lower inner quadrant, stage TI B., N0, receptor negtive, HER-2 positive.   Plan:  Remove Port-A-Cath once Dr. Humphrey Rolls approves of this  Bilateral mammograms in May, 2016  Return to see me after mammograms are done.    Edsel Petrin. Dalbert Batman, M.D., [facility]  General and Minimally invasive Surgery  Breast and Colorectal Surgery  Office: [phone]  Pager: [phone]

## 2013-12-19 ENCOUNTER — Encounter (HOSPITAL_BASED_OUTPATIENT_CLINIC_OR_DEPARTMENT_OTHER): Payer: Self-pay | Admitting: *Deleted

## 2013-12-19 ENCOUNTER — Ambulatory Visit (HOSPITAL_BASED_OUTPATIENT_CLINIC_OR_DEPARTMENT_OTHER)
Admission: RE | Admit: 2013-12-19 | Discharge: 2013-12-19 | Disposition: A | Discharge status: Home / Self Care | Payer: Medicare Other | Source: Ambulatory Visit | Attending: General Surgery | Admitting: General Surgery

## 2013-12-19 ENCOUNTER — Encounter (HOSPITAL_BASED_OUTPATIENT_CLINIC_OR_DEPARTMENT_OTHER)
Admission: RE | Disposition: A | Discharge status: Home / Self Care | Payer: Self-pay | Source: Ambulatory Visit | Attending: General Surgery

## 2013-12-19 DIAGNOSIS — Z9010 Acquired absence of unspecified breast and nipple: Secondary | ICD-10-CM | POA: Insufficient documentation

## 2013-12-19 DIAGNOSIS — Z452 Encounter for adjustment and management of vascular access device: Secondary | ICD-10-CM

## 2013-12-19 DIAGNOSIS — Z923 Personal history of irradiation: Secondary | ICD-10-CM | POA: Insufficient documentation

## 2013-12-19 DIAGNOSIS — C50912 Malignant neoplasm of unspecified site of left female breast: Secondary | ICD-10-CM

## 2013-12-19 DIAGNOSIS — Z9221 Personal history of antineoplastic chemotherapy: Secondary | ICD-10-CM | POA: Insufficient documentation

## 2013-12-19 DIAGNOSIS — E079 Disorder of thyroid, unspecified: Secondary | ICD-10-CM | POA: Insufficient documentation

## 2013-12-19 DIAGNOSIS — Z853 Personal history of malignant neoplasm of breast: Secondary | ICD-10-CM | POA: Insufficient documentation

## 2013-12-19 DIAGNOSIS — C50919 Malignant neoplasm of unspecified site of unspecified female breast: Secondary | ICD-10-CM | POA: Diagnosis present

## 2013-12-19 DIAGNOSIS — K3184 Gastroparesis: Secondary | ICD-10-CM | POA: Insufficient documentation

## 2013-12-19 HISTORY — PX: PORT-A-CATH REMOVAL: SHX5289

## 2013-12-19 SURGERY — MINOR REMOVAL PORT-A-CATH
Anesthesia: LOCAL | Site: Chest | Laterality: Right

## 2013-12-19 MED ORDER — SODIUM BICARBONATE 4 % IV SOLN
INTRAVENOUS | Status: DC | PRN
  Administered 2013-12-19: 08:00:00

## 2013-12-19 MED ORDER — CHLORHEXIDINE GLUCONATE 4 % EX LIQD: 1.0000 "application " | Freq: Once | CUTANEOUS | Status: DC

## 2013-12-19 MED ORDER — SODIUM BICARBONATE 4 % IV SOLN
INTRAVENOUS | Status: AC
  Filled 2013-12-19: qty 5

## 2013-12-19 SURGICAL SUPPLY — 37 items
ADH SKN CLS APL DERMABOND .7 (GAUZE/BANDAGES/DRESSINGS) ×1
APL SKNCLS STERI-STRIP NONHPOA (GAUZE/BANDAGES/DRESSINGS)
BENZOIN TINCTURE PRP APPL 2/3 (GAUZE/BANDAGES/DRESSINGS) IMPLANT
BLADE SURG 15 STRL LF DISP TIS (BLADE) ×1 IMPLANT
BLADE SURG 15 STRL SS (BLADE) ×2
CHLORAPREP W/TINT 26ML (MISCELLANEOUS) ×2 IMPLANT
CONT SPEC 4OZ CLIKSEAL STRL BL (MISCELLANEOUS) IMPLANT
COVER SURGICAL LIGHT HANDLE (MISCELLANEOUS) ×2 IMPLANT
DERMABOND ADVANCED (GAUZE/BANDAGES/DRESSINGS) ×1
DERMABOND ADVANCED .7 DNX12 (GAUZE/BANDAGES/DRESSINGS) IMPLANT
DRAPE SURG 17X23 STRL (DRAPES) IMPLANT
ELECT REM PT RETURN 9FT ADLT (ELECTROSURGICAL)
ELECTRODE REM PT RTRN 9FT ADLT (ELECTROSURGICAL) IMPLANT
GAUZE SPONGE 4X4 16PLY XRAY LF (GAUZE/BANDAGES/DRESSINGS) ×2 IMPLANT
GLOVE BIO SURGEON STRL SZ7 (GLOVE) ×2 IMPLANT
GLOVE EUDERMIC 7 POWDERFREE (GLOVE) ×2 IMPLANT
MARKER SKIN DUAL TIP RULER LAB (MISCELLANEOUS) ×2 IMPLANT
NDL HYPO 25X1 1.5 SAFETY (NEEDLE) ×1 IMPLANT
NDL SAFETY ECLIPSE 18X1.5 (NEEDLE) ×1 IMPLANT
NEEDLE HYPO 18GX1.5 SHARP (NEEDLE)
NEEDLE HYPO 25X1 1.5 SAFETY (NEEDLE) ×2 IMPLANT
NS IRRIG 1000ML POUR BTL (IV SOLUTION) IMPLANT
PENCIL BUTTON HOLSTER BLD 10FT (ELECTRODE) IMPLANT
SPONGE GAUZE 4X4 12PLY STER LF (GAUZE/BANDAGES/DRESSINGS) ×2 IMPLANT
STRIP CLOSURE SKIN 1/2X4 (GAUZE/BANDAGES/DRESSINGS) IMPLANT
SUT MNCRL AB 3-0 PS2 18 (SUTURE) ×1 IMPLANT
SUT MNCRL AB 4-0 PS2 18 (SUTURE) IMPLANT
SUT VIC AB 3-0 SH 27 (SUTURE) ×2
SUT VIC AB 3-0 SH 27X BRD (SUTURE) IMPLANT
SUT VIC AB 4-0 P-3 18XBRD (SUTURE) IMPLANT
SUT VIC AB 4-0 P3 18 (SUTURE)
SUT VIC AB 5-0 P-3 18X BRD (SUTURE) IMPLANT
SUT VIC AB 5-0 P3 18 (SUTURE)
SUT VICRYL 3-0 CR8 SH (SUTURE) IMPLANT
SYRINGE CONTROL L 12CC (SYRINGE) ×2 IMPLANT
SYRINGE CONTROL LL 12CC (SYRINGE) ×1 IMPLANT
TOWEL OR 17X24 6PK STRL BLUE (TOWEL DISPOSABLE) ×1 IMPLANT

## 2013-12-19 NOTE — Discharge Instructions (Signed)
Place an ice pack on the incision intermittently for 24 hours.  You may shower  briefly, starting tomorrow. No tub baths for 2 weeks.  You may drive your car when you are comfortable.  Be sure to followup with your medical oncologist at the [facility] as scheduled  Call Dr. Dalbert Batman if there are any wound problems.  Return to see Dr. Dalbert Batman in one year, following bilateral mammograms which should be performed in May, 2016.

## 2013-12-19 NOTE — Interval H&P Note (Signed)
History and Physical Interval Note:  12/19/2013 7:05 AM  [name]  has presented today for surgery, with the diagnosis of Breast cancer  The goals and the various methods of treatment have been discussed with the patient and family. After consideration of risks, benefits and other options for treatment, the patient has consented to  Procedure(s): REMOVAL PORT-A-CATH   (MINOR PEOCEDURE)  (N/A) as a surgical intervention .  The patient's history has been reviewed, patient examined today, no change in status, stable for surgery.  I have reviewed the patient's chart and labs.  Questions were answered to the patient's satisfaction.     Adin Hector

## 2013-12-19 NOTE — Op Note (Signed)
  Patient Name:           [name]   Date of Surgery:        12/19/2013  Note: This dictation was prepared with Dragon/digital dictation along with [facility] technology. Any transcriptional errors that result from this process are unintentional.  Pre op Diagnosis:      Invasive cancer left breast. Desires Port-A-Cath removal  Post op Diagnosis:    Same  Procedure:                 Removal of Port-A-Cath  Surgeon:                     Edsel Petrin. Dalbert Batman, M.D., FACS  Assistant:                      None  Operative Indications:   This patient desires removal of port a cath  On 09/29/2011 she underwent left partial mastectomy, sentinel node biopsy and Port-A-Cath insertion. 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 Pathologic stage TI B. N0.  Following adjuvant chemotherapy she underwent radiation therapy. She received Herceptin chemotherapy.  She states that after the radiation therapy she developed swelling in the left breast.  A left breast mammogram on 10/03/2013 is completely benign. There is no evidence of cancer clinically or radiographically. She is brought to the operating room electively for removal of Port-A-Cath   Operative Findings:       The procedure was performed under local anesthesia. The port and catheter were removed intact without difficulty  Procedure in Detail:          The patient was brought to room 6 at [facility] and placed supine upon the operating table. The right upper chest and neck were prepped and draped in a sterile fashion. The usual surgical time out was performed. 1% Xylocaine with epinephrine was used as a local infiltration anesthetic. Transverse incision was made through the old scar. Dissection was carried down into the deep subcutaneous tissue. The capsule surrounding the port was entered and dissected away from the port. The Prolene sutures were cut and removed. The port and catheter slid  out intact without difficulty or resistance. There was no bleeding or sign of infection. Subcutaneous tissue was closed with interrupted sutures of 3-0 Vicryl and the skin closed with a running subcuticular suture of 4-0 Monocryl and Dermabond. Patient tolerated procedure well was taken to the recovery area in stable condition. EBL 5 cc. Counts correct. Competition on.     Edsel Petrin. Dalbert Batman, M.D., FACS General and Minimally Invasive Surgery Breast and Colorectal Surgery  12/19/2013 8:01 AM

## 2013-12-20 ENCOUNTER — Telehealth (INDEPENDENT_AMBULATORY_CARE_PROVIDER_SITE_OTHER): Payer: Self-pay

## 2013-12-20 ENCOUNTER — Encounter (HOSPITAL_BASED_OUTPATIENT_CLINIC_OR_DEPARTMENT_OTHER): Payer: Self-pay | Admitting: General Surgery

## 2013-12-20 NOTE — Telephone Encounter (Signed)
Pt home doing well. Pt will f/u at yearly br ck unless she has concerns before pt d/c instr.

## 2013-12-22 ENCOUNTER — Ambulatory Visit (INDEPENDENT_AMBULATORY_CARE_PROVIDER_SITE_OTHER): Payer: Medicare Other

## 2013-12-22 DIAGNOSIS — E538 Deficiency of other specified B group vitamins: Secondary | ICD-10-CM

## 2013-12-22 MED ORDER — CYANOCOBALAMIN 1000 MCG/ML IJ SOLN
1000.0000 ug | Freq: Once | INTRAMUSCULAR | Status: AC
  Administered 2013-12-22: 1000 ug via INTRAMUSCULAR

## 2013-12-22 MED ORDER — METHYLPREDNISOLONE ACETATE 80 MG/ML IJ SUSP: 80.0000 mg | Freq: Once | INTRAMUSCULAR | Status: DC

## 2014-01-03 ENCOUNTER — Encounter: Payer: Self-pay | Admitting: Endocrinology

## 2014-01-08 ENCOUNTER — Telehealth: Payer: Self-pay | Admitting: Gastroenterology

## 2014-01-08 ENCOUNTER — Encounter (HOSPITAL_COMMUNITY): Payer: Self-pay | Admitting: Emergency Medicine

## 2014-01-08 ENCOUNTER — Emergency Department (HOSPITAL_COMMUNITY): Payer: Medicare Other

## 2014-01-08 ENCOUNTER — Emergency Department (HOSPITAL_COMMUNITY)
Admission: EM | Admit: 2014-01-08 | Discharge: 2014-01-08 | Disposition: A | Discharge status: Home / Self Care | Payer: Medicare Other | Attending: Emergency Medicine | Admitting: Emergency Medicine

## 2014-01-08 DIAGNOSIS — E039 Hypothyroidism, unspecified: Secondary | ICD-10-CM | POA: Insufficient documentation

## 2014-01-08 DIAGNOSIS — E876 Hypokalemia: Secondary | ICD-10-CM | POA: Insufficient documentation

## 2014-01-08 DIAGNOSIS — Z853 Personal history of malignant neoplasm of breast: Secondary | ICD-10-CM | POA: Insufficient documentation

## 2014-01-08 DIAGNOSIS — Z87891 Personal history of nicotine dependence: Secondary | ICD-10-CM | POA: Insufficient documentation

## 2014-01-08 DIAGNOSIS — M1990 Unspecified osteoarthritis, unspecified site: Secondary | ICD-10-CM | POA: Insufficient documentation

## 2014-01-08 DIAGNOSIS — R197 Diarrhea, unspecified: Secondary | ICD-10-CM | POA: Insufficient documentation

## 2014-01-08 DIAGNOSIS — E119 Type 2 diabetes mellitus without complications: Secondary | ICD-10-CM | POA: Diagnosis not present

## 2014-01-08 DIAGNOSIS — K219 Gastro-esophageal reflux disease without esophagitis: Secondary | ICD-10-CM | POA: Insufficient documentation

## 2014-01-08 DIAGNOSIS — IMO0002 Reserved for concepts with insufficient information to code with codable children: Secondary | ICD-10-CM | POA: Diagnosis not present

## 2014-01-08 DIAGNOSIS — Z87442 Personal history of urinary calculi: Secondary | ICD-10-CM | POA: Insufficient documentation

## 2014-01-08 DIAGNOSIS — Z87440 Personal history of urinary (tract) infections: Secondary | ICD-10-CM | POA: Diagnosis not present

## 2014-01-08 DIAGNOSIS — Z79899 Other long term (current) drug therapy: Secondary | ICD-10-CM | POA: Insufficient documentation

## 2014-01-08 DIAGNOSIS — Z923 Personal history of irradiation: Secondary | ICD-10-CM | POA: Diagnosis not present

## 2014-01-08 DIAGNOSIS — G43909 Migraine, unspecified, not intractable, without status migrainosus: Secondary | ICD-10-CM | POA: Diagnosis not present

## 2014-01-08 DIAGNOSIS — R112 Nausea with vomiting, unspecified: Secondary | ICD-10-CM | POA: Diagnosis not present

## 2014-01-08 DIAGNOSIS — D509 Iron deficiency anemia, unspecified: Secondary | ICD-10-CM | POA: Diagnosis not present

## 2014-01-08 DIAGNOSIS — E669 Obesity, unspecified: Secondary | ICD-10-CM | POA: Diagnosis not present

## 2014-01-08 DIAGNOSIS — Z8619 Personal history of other infectious and parasitic diseases: Secondary | ICD-10-CM | POA: Insufficient documentation

## 2014-01-08 LAB — URINALYSIS, ROUTINE W REFLEX MICROSCOPIC
Bilirubin Urine: NEGATIVE
Glucose, UA: NEGATIVE mg/dL
Hgb urine dipstick: NEGATIVE
KETONES UR: NEGATIVE mg/dL
NITRITE: NEGATIVE
PH: 6 (ref 5.0–8.0)
Protein, ur: NEGATIVE mg/dL
Specific Gravity, Urine: 1.017 (ref 1.005–1.030)
Urobilinogen, UA: 0.2 mg/dL (ref 0.0–1.0)

## 2014-01-08 LAB — CBC WITH DIFFERENTIAL/PLATELET
Basophils Absolute: 0 10*3/uL (ref 0.0–0.1)
Basophils Relative: 0 % (ref 0–1)
Eosinophils Absolute: 0.1 10*3/uL (ref 0.0–0.7)
Eosinophils Relative: 2 % (ref 0–5)
HCT: 32.3 % — ABNORMAL LOW (ref 36.0–46.0)
Hemoglobin: 10.8 g/dL — ABNORMAL LOW (ref 12.0–15.0)
LYMPHS ABS: 1.1 10*3/uL (ref 0.7–4.0)
Lymphocytes Relative: 30 % (ref 12–46)
MCH: 29.8 pg (ref 26.0–34.0)
MCHC: 33.4 g/dL (ref 30.0–36.0)
MCV: 89 fL (ref 78.0–100.0)
Monocytes Absolute: 0.3 10*3/uL (ref 0.1–1.0)
Monocytes Relative: 8 % (ref 3–12)
NEUTROS PCT: 60 % (ref 43–77)
Neutro Abs: 2.3 10*3/uL (ref 1.7–7.7)
Platelets: 227 10*3/uL (ref 150–400)
RBC: 3.63 MIL/uL — AB (ref 3.87–5.11)
RDW: 13 % (ref 11.5–15.5)
WBC: 3.8 10*3/uL — AB (ref 4.0–10.5)

## 2014-01-08 LAB — COMPREHENSIVE METABOLIC PANEL
ALBUMIN: 3.6 g/dL (ref 3.5–5.2)
ALT: 14 U/L (ref 0–35)
ANION GAP: 13 (ref 5–15)
AST: 21 U/L (ref 0–37)
Alkaline Phosphatase: 73 U/L (ref 39–117)
BUN: 13 mg/dL (ref 6–23)
CALCIUM: 9.7 mg/dL (ref 8.4–10.5)
CO2: 24 mEq/L (ref 19–32)
CREATININE: 1.1 mg/dL (ref 0.50–1.10)
Chloride: 104 mEq/L (ref 96–112)
GFR calc Af Amer: 58 mL/min — ABNORMAL LOW (ref >90)
GFR calc non Af Amer: 50 mL/min — ABNORMAL LOW (ref >90)
Glucose, Bld: 95 mg/dL (ref 70–99)
Potassium: 3.2 mEq/L — ABNORMAL LOW (ref 3.7–5.3)
Sodium: 141 mEq/L (ref 137–147)
TOTAL PROTEIN: 7.4 g/dL (ref 6.0–8.3)
Total Bilirubin: 0.9 mg/dL (ref 0.3–1.2)

## 2014-01-08 LAB — I-STAT CG4 LACTIC ACID, ED: Lactic Acid, Venous: 1.39 mmol/L (ref 0.5–2.2)

## 2014-01-08 LAB — URINE MICROSCOPIC-ADD ON

## 2014-01-08 LAB — LIPASE, BLOOD: LIPASE: 32 U/L (ref 11–59)

## 2014-01-08 MED ORDER — BARIUM SULFATE 2.1 % PO SUSP
ORAL | Status: AC
  Administered 2014-01-08 (×2): via ORAL

## 2014-01-08 MED ORDER — ONDANSETRON HCL 4 MG/2ML IJ SOLN
4.0000 mg | Freq: Once | INTRAMUSCULAR | Status: AC
  Administered 2014-01-08: 4 mg via INTRAVENOUS
  Filled 2014-01-08: qty 2

## 2014-01-08 MED ORDER — DIPHENHYDRAMINE HCL 50 MG/ML IJ SOLN
25.0000 mg | Freq: Once | INTRAMUSCULAR | Status: AC
  Administered 2014-01-08: 25 mg via INTRAVENOUS
  Filled 2014-01-08: qty 1

## 2014-01-08 MED ORDER — MORPHINE SULFATE 4 MG/ML IJ SOLN
4.0000 mg | Freq: Once | INTRAMUSCULAR | Status: AC
  Administered 2014-01-08: 4 mg via INTRAVENOUS
  Filled 2014-01-08: qty 1

## 2014-01-08 MED ORDER — ONDANSETRON HCL 4 MG PO TABS: 4.0000 mg | ORAL_TABLET | Freq: Three times a day (TID) | ORAL | Status: DC | PRN

## 2014-01-08 MED ORDER — SODIUM CHLORIDE 0.9 % IV BOLUS (SEPSIS)
1000.0000 mL | Freq: Once | INTRAVENOUS | Status: AC
  Administered 2014-01-08: 1000 mL via INTRAVENOUS

## 2014-01-08 MED ORDER — POTASSIUM CHLORIDE CRYS ER 20 MEQ PO TBCR
40.0000 meq | EXTENDED_RELEASE_TABLET | Freq: Once | ORAL | Status: AC
  Administered 2014-01-08: 40 meq via ORAL
  Filled 2014-01-08: qty 2

## 2014-01-08 MED ORDER — LOPERAMIDE HCL 2 MG PO CAPS: 2.0000 mg | ORAL_CAPSULE | Freq: Four times a day (QID) | ORAL | Status: DC | PRN

## 2014-01-08 MED ORDER — HYDROCODONE-ACETAMINOPHEN 5-325 MG PO TABS
1.0000 | ORAL_TABLET | Freq: Four times a day (QID) | ORAL | Status: DC | PRN
Start: 2014-01-08 — End: 2014-01-16

## 2014-01-08 NOTE — ED Provider Notes (Signed)
TIME SEEN: 11:50 AM  CHIEF COMPLAINT: Abdominal pain, diarrhea, vomiting  HPI: Patient is a 68 year old female with history of peptic ulcer disease, short bowel syndrome, prior history of C. difficile colitis, diverticulosis, gastroparesis, history of breast cancer currently in remission who presents to the emergency department with complaints of one week of diarrhea, crampy abdominal pain that is worse in the left lower quadrant and nausea and vomiting that started yesterday. Last episode of vomiting was yesterday. She has not had fevers or chills. No dysuria or hematuria. No vaginal bleeding or discharge. She states she's had a history of "bowel blockages" but states this feels different. She states she is passing gas. She is status post cholecystectomy, hysterectomy with BSO, she states she has had prior ?SB/colon resections for bowel obstructions. No sick contacts, recent travel, recent and about use or hospitalization.  ROS: See HPI Constitutional: no fever  Eyes: no drainage  ENT: no runny nose   Cardiovascular:  no chest pain  Resp: no SOB  GI:  vomiting GU: no dysuria Integumentary: no rash  Allergy: no hives  Musculoskeletal: no leg swelling  Neurological: no slurred speech ROS otherwise negative  PAST MEDICAL HISTORY/PAST SURGICAL HISTORY:  Past Medical History  Diagnosis Date  . Obesity   . PUD (peptic ulcer disease)   . Leukopenia   . Short bowel syndrome   . Internal hemorrhoids without mention of complication   . Stricture and stenosis of esophagus   . Osteoarthrosis, unspecified whether generalized or localized, unspecified site   . Thyrotoxicosis without mention of goiter or other cause, without mention of thyrotoxic crisis or storm   . Other and unspecified hyperlipidemia   . Gout, unspecified   . Diverticulosis of colon (without mention of hemorrhage)   . C. difficile colitis   . VITAMIN B12 DEFICIENCY 08/30/2009  . GOITER, MULTINODULAR 04/02/2009  .  HYPOTHYROIDISM, POST-RADIATION 08/13/2009  . ASYMPTOMATIC POSTMENOPAUSAL STATUS 10/11/2008  . Esophageal reflux 06/12/2008  . PONV (postoperative nausea and vomiting)   . Varicose veins   . Blood transfusion   . UTI (urinary tract infection)   . Kidney stones     "several"  . Unspecified essential hypertension   . Angina   . Shortness of breath on exertion     "sometimes"  . ANEMIA, IRON DEFICIENCY 05/08/2009  . History of lower GI bleeding   . H/O hiatal hernia   . Migraines   . Breast cancer 09/29/11    invasive grade III ductal ca,assoc high grade dcis,ER/PR=neg  . History of radiation therapy 02/08/12-03/25/12    left breast,total 61gy  . Blood transfusion without reported diagnosis   . Hypomagnesemia   . Hypokalemia 05/11/2013  . Gastroparesis   . Type II or unspecified type diabetes mellitus without mention of complication, not stated as uncontrolled     no med in years diet controled    MEDICATIONS:  Prior to Admission medications   Medication Sig Start Date End Date Taking? Authorizing Provider  calcium carbonate (TUMS - DOSED IN MG ELEMENTAL CALCIUM) 500 MG chewable tablet Chew 1 tablet by mouth as needed for indigestion or heartburn.   Yes Historical Provider, MD  Calcium Carbonate-Vitamin D (CALCIUM-VITAMIN D) 600-200 MG-UNIT CAPS Take 1 capsule by mouth daily.     Yes Historical Provider, MD  cyanocobalamin (,VITAMIN B-12,) 1000 MCG/ML injection Inject 1,000 mcg into the muscle every 30 (thirty) days. Next one due the 26th of this month   Yes Historical Provider, MD  ergocalciferol (VITAMIN D2)  50000 UNITS capsule Take 1 capsule (50,000 Units total) by mouth once a week. 08/10/13  Yes Amada Kingfisher, MD  fluorometholone (FML) 0.1 % ophthalmic suspension Place 1 drop into both eyes 2 (two) times daily. 03/14/13  Yes Historical Provider, MD  HYDROcodone-acetaminophen (NORCO) 10-325 MG per tablet Take 1 tablet by mouth every 6 (six) hours as needed for moderate pain.   Yes  Historical Provider, MD  levothyroxine (SYNTHROID, LEVOTHROID) 150 MCG tablet Take 150 mcg by mouth daily before breakfast.   Yes Historical Provider, MD  lovastatin (MEVACOR) 20 MG tablet Take 1 tablet (20 mg total) by mouth daily at 6 PM. 11/15/13  Yes Renato Shin, MD  magnesium chloride (SLOW-MAG) 64 MG TBEC SR tablet Take 1 tablet (64 mg total) by mouth daily. 12/15/13  Yes Renato Shin, MD  pantoprazole (PROTONIX) 40 MG tablet Take 1 tablet 30 minutes before breakfast and supper 09/05/13  Yes Ladene Artist, MD  potassium chloride SA (K-DUR,KLOR-CON) 20 MEQ tablet Take 1 tablet (20 mEq total) by mouth daily. 08/10/13  Yes Amada Kingfisher, MD  spironolactone (ALDACTONE) 25 MG tablet Take 25 mg by mouth daily.   Yes Historical Provider, MD  thiamine 100 MG tablet Take 100 mg by mouth daily.     Yes Historical Provider, MD  zolpidem (AMBIEN) 5 MG tablet Take 1 tablet (5 mg total) by mouth at bedtime as needed for sleep. 09/14/13  Yes Renato Shin, MD  calcium gluconate 650 MG tablet Take 1 tablet (650 mg total) by mouth daily. 01/14/12 01/13/13  Deatra Robinson, MD    ALLERGIES:  Allergies  Allergen Reactions  . Aspirin Other (See Comments)    REACTION: Gi Intolerance/ Burning in stomach  . Codeine Itching  . Iodine Itching    Allergic to IVP dye  . Morphine And Related Itching    SOCIAL HISTORY:  History  Substance Use Topics  . Smoking status: Former Smoker -- 1.00 packs/day for 10 years    Types: Cigarettes    Quit date: 09/22/1985  . Smokeless tobacco: Never Used  . Alcohol Use: No     Comment: 09/29/11 "used to drink socially years ago"    FAMILY HISTORY: Family History  Problem Relation Age of Onset  . Esophageal cancer Son     deceased  . Cancer Son     Esophageal Cancer  . Diabetes Mother   . Heart disease Mother   . Colon cancer Paternal Uncle   . Cancer Paternal Uncle     Colon Cancer  . Kidney disease Sister   . Diabetes Father   . Hypertension Father   . Kidney  disease Brother   . Kidney disease Brother   . Kidney disease Brother     EXAM: BP 114/73  Pulse 75  Temp(Src) 98 F (36.7 C) (Oral)  Resp 16  Ht 5\' 5"  (1.651 m)  Wt 190 lb (86.183 kg)  BMI 31.62 kg/m2  SpO2 100% CONSTITUTIONAL: Alert and oriented and responds appropriately to questions. Well-appearing; well-nourished HEAD: Normocephalic EYES: Conjunctivae clear, PERRL ENT: normal nose; no rhinorrhea; moist mucous membranes; pharynx without lesions noted NECK: Supple, no meningismus, no LAD  CARD: RRR; S1 and S2 appreciated; no murmurs, no clicks, no rubs, no gallops RESP: Normal chest excursion without splinting or tachypnea; breath sounds clear and equal bilaterally; no wheezes, no rhonchi, no rales,  ABD/GI: Normal bowel sounds; non-distended; soft, diffusely tender to palpation in the lower abdomen, left lower quadrant worse than right lower quadrant  with very mild voluntary guarding, no rebound no peritoneal signs BACK:  The back appears normal and is non-tender to palpation, there is no CVA tenderness EXT: Normal ROM in all joints; non-tender to palpation; no edema; normal capillary refill; no cyanosis    SKIN: Normal color for age and race; warm NEURO: Moves all extremities equally PSYCH: The patient's mood and manner are appropriate. Grooming and personal hygiene are appropriate.  MEDICAL DECISION MAKING: Patient here with vomiting, diarrhea and left-sided abdominal pain. Differential diagnosis includes diverticulitis, colitis, bowel obstruction, UTI. Will obtain abdominal labs, urine and a CT of her abdomen and pelvis. We'll give morphine, Zofran and IV fluids.  ED PROGRESS: Patient's labs unremarkable other than mild hypokalemia 3.2. We have replaced. She has leukopenia which is chronic. Urine shows small leukocytes but no other sign of infection. CT pending.    4:30 PM  CT scan shows a large and complex lesion in the right lower quadrant that could represent a complex  fluid collection versus malignancy. Thought less likely malignancy. Radiology reports is also could be a post operative fluid collection she states she has not had abdominal surgery in several years. She does have chronically mildly enlarged appendix which is stable compared to CT imaging in August 2014. She has had this right lower quadrant fluid collection since August 2014 but it has doubled in size. No acute changes that could be causing her symptoms today. Discussed with Dr. Lucia Gaskins with surgery who has reviewed patient's CT scan. This area has been present for over one year and is unlikely the cause of her acute symptoms today. I feel she can be discharged home on pain medication, antiemetics and antidiarrheal agents. We'll have her followup with her PCP, Dr. Renato Shin. It appears she has seen Dr. Fanny Skates in the past for mastectomy and port removal. We'll have her followup with Dr. Dalbert Batman for followup for this chronic fluid collection. Discussed strict return precautions and supportive care injections. Patient verbalizes understanding and is comfortable with plan.      [place], DO 01/08/14 1635

## 2014-01-08 NOTE — Discharge Instructions (Signed)
Diarrhea Diarrhea is frequent loose and watery bowel movements. It can cause you to feel weak and dehydrated. Dehydration can cause you to become tired and thirsty, have a dry mouth, and have decreased urination that often is dark yellow. Diarrhea is a sign of another problem, most often an infection that will not last long. In most cases, diarrhea typically lasts 2-3 days. However, it can last longer if it is a sign of something more serious. It is important to treat your diarrhea as directed by your caregiver to lessen or prevent future episodes of diarrhea. CAUSES  Some common causes include:  Gastrointestinal infections caused by viruses, bacteria, or parasites.  Food poisoning or food allergies.  Certain medicines, such as antibiotics, chemotherapy, and laxatives.  Artificial sweeteners and fructose.  Digestive disorders. HOME CARE INSTRUCTIONS  Ensure adequate fluid intake (hydration): Have 1 cup (8 oz) of fluid for each diarrhea episode. Avoid fluids that contain simple sugars or sports drinks, fruit juices, whole milk products, and sodas. Your urine should be clear or pale yellow if you are drinking enough fluids. Hydrate with an oral rehydration solution that you can purchase at pharmacies, retail stores, and online. You can prepare an oral rehydration solution at home by mixing the following ingredients together:   - tsp table salt.   tsp baking soda.   tsp salt substitute containing potassium chloride.  1  tablespoons sugar.  1 L (34 oz) of water.  Certain foods and beverages may increase the speed at which food moves through the gastrointestinal (GI) tract. These foods and beverages should be avoided and include:  Caffeinated and alcoholic beverages.  High-fiber foods, such as raw fruits and vegetables, nuts, seeds, and whole grain breads and cereals.  Foods and beverages sweetened with sugar alcohols, such as xylitol, sorbitol, and mannitol.  Some foods may be well  tolerated and may help thicken stool including:  Starchy foods, such as rice, toast, pasta, low-sugar cereal, oatmeal, grits, baked potatoes, crackers, and bagels.  Bananas.  Applesauce.  Add probiotic-rich foods to help increase healthy bacteria in the GI tract, such as yogurt and fermented milk products.  Wash your hands well after each diarrhea episode.  Only take over-the-counter or prescription medicines as directed by your caregiver.  Take a warm bath to relieve any burning or pain from frequent diarrhea episodes. SEEK IMMEDIATE MEDICAL CARE IF:   You are unable to keep fluids down.  You have persistent vomiting.  You have blood in your stool, or your stools are black and tarry.  You do not urinate in 6-8 hours, or there is only a small amount of very dark urine.  You have abdominal pain that increases or localizes.  You have weakness, dizziness, confusion, or light-headedness.  You have a severe headache.  Your diarrhea gets worse or does not get better.  You have a fever or persistent symptoms for more than 2-3 days.  You have a fever and your symptoms suddenly get worse. MAKE SURE YOU:   Understand these instructions.  Will watch your condition.  Will get help right away if you are not doing well or get worse. Document Released: 05/08/2002 Document Revised: 10/02/2013 Document Reviewed: 01/24/2012 [facility] Patient Information 2015 [place], [place]. This information is not intended to replace advice given to you by your health care provider. Make sure you discuss any questions you have with your health care provider.    Possible Viral Gastroenteritis Viral gastroenteritis is also known as stomach flu. This condition affects  the stomach and intestinal tract. It can cause sudden diarrhea and vomiting. The illness typically lasts 3 to 8 days. Most people develop an immune response that eventually gets rid of the virus. While this natural response develops, the  virus can make you quite ill. CAUSES  Many different viruses can cause gastroenteritis, such as rotavirus or noroviruses. You can catch one of these viruses by consuming contaminated food or water. You may also catch a virus by sharing utensils or other personal items with an infected person or by touching a contaminated surface. SYMPTOMS  The most common symptoms are diarrhea and vomiting. These problems can cause a severe loss of body fluids (dehydration) and a body salt (electrolyte) imbalance. Other symptoms may include:  Fever.  Headache.  Fatigue.  Abdominal pain. DIAGNOSIS  Your caregiver can usually diagnose viral gastroenteritis based on your symptoms and a physical exam. A stool sample may also be taken to test for the presence of viruses or other infections. TREATMENT  This illness typically goes away on its own. Treatments are aimed at rehydration. The most serious cases of viral gastroenteritis involve vomiting so severely that you are not able to keep fluids down. In these cases, fluids must be given through an intravenous line (IV). HOME CARE INSTRUCTIONS   Drink enough fluids to keep your urine clear or pale yellow. Drink small amounts of fluids frequently and increase the amounts as tolerated.  Ask your caregiver for specific rehydration instructions.  Avoid:  Foods high in sugar.  Alcohol.  Carbonated drinks.  Tobacco.  Juice.  Caffeine drinks.  Extremely hot or cold fluids.  Fatty, greasy foods.  Too much intake of anything at one time.  Dairy products until 24 to 48 hours after diarrhea stops.  You may consume probiotics. Probiotics are active cultures of beneficial bacteria. They may lessen the amount and number of diarrheal stools in adults. Probiotics can be found in yogurt with active cultures and in supplements.  Wash your hands well to avoid spreading the virus.  Only take over-the-counter or prescription medicines for pain, discomfort, or  fever as directed by your caregiver. Do not give aspirin to children. Antidiarrheal medicines are not recommended.  Ask your caregiver if you should continue to take your regular prescribed and over-the-counter medicines.  Keep all follow-up appointments as directed by your caregiver. SEEK IMMEDIATE MEDICAL CARE IF:   You are unable to keep fluids down.  You do not urinate at least once every 6 to 8 hours.  You develop shortness of breath.  You notice blood in your stool or vomit. This may look like coffee grounds.  You have abdominal pain that increases or is concentrated in one small area (localized).  You have persistent vomiting or diarrhea.  You have a fever.  The patient is a child younger than 3 months, and he or she has a fever.  The patient is a child older than 3 months, and he or she has a fever and persistent symptoms.  The patient is a child older than 3 months, and he or she has a fever and symptoms suddenly get worse.  The patient is a baby, and he or she has no tears when crying. MAKE SURE YOU:   Understand these instructions.  Will watch your condition.  Will get help right away if you are not doing well or get worse. Document Released: 05/18/2005 Document Revised: 08/10/2011 Document Reviewed: 03/04/2011 [facility] Patient Information 2015 [place], [place]. This information is not intended to  replace advice given to you by your health care provider. Make sure you discuss any questions you have with your health care provider. ° °

## 2014-01-08 NOTE — ED Notes (Signed)
Pt states she has been having diarrhea for the past week. States she is having nausea and vomiting (this am). Abdominal pain localized to left side. Denies SOB.

## 2014-01-08 NOTE — Telephone Encounter (Signed)
I returned the call to the patient.  She reports that her pain has worsened and she is on the way to the ER.  She will call back to set up follow up if Gi related

## 2014-01-09 ENCOUNTER — Other Ambulatory Visit: Payer: Medicare Other

## 2014-01-09 ENCOUNTER — Telehealth: Payer: Self-pay | Admitting: Gastroenterology

## 2014-01-09 DIAGNOSIS — R197 Diarrhea, unspecified: Secondary | ICD-10-CM

## 2014-01-09 NOTE — Telephone Encounter (Signed)
Patient was evaluated in the ER last night for diarrhea and abdominal pain.  CT showed a fluid collection in the right lower quadrant that they recommended she follow up with surgery Dr. Dalbert Batman for.  She is advised of this.  Dr. Hilarie Fredrickson has reviewed the CT and ER notes as MD of the day. Patient needs a GI pathogen panel and to follow up with surgery and her primary care for other abnormalities in the scan.  Patient verbalized understanding.  She will send her husband to pick up stool container for pathogen panel.

## 2014-01-10 LAB — GASTROINTESTINAL PATHOGEN PANEL PCR
C. DIFFICILE TOX A/B, PCR: NEGATIVE
CRYPTOSPORIDIUM, PCR: NEGATIVE
Campylobacter, PCR: NEGATIVE
E coli (ETEC) LT/ST PCR: NEGATIVE
E coli (STEC) stx1/stx2, PCR: NEGATIVE
E coli 0157, PCR: NEGATIVE
GIARDIA LAMBLIA, PCR: NEGATIVE
Norovirus, PCR: NEGATIVE
ROTAVIRUS, PCR: NEGATIVE
SALMONELLA, PCR: NEGATIVE
Shigella, PCR: NEGATIVE

## 2014-01-16 ENCOUNTER — Ambulatory Visit (INDEPENDENT_AMBULATORY_CARE_PROVIDER_SITE_OTHER): Payer: Medicare Other | Admitting: Endocrinology

## 2014-01-16 ENCOUNTER — Encounter: Payer: Self-pay | Admitting: Endocrinology

## 2014-01-16 VITALS — BP 110/68 | HR 78 | Temp 98.1°F | Ht 65.0 in | Wt 191.0 lb

## 2014-01-16 DIAGNOSIS — R197 Diarrhea, unspecified: Secondary | ICD-10-CM

## 2014-01-16 DIAGNOSIS — E559 Vitamin D deficiency, unspecified: Secondary | ICD-10-CM | POA: Insufficient documentation

## 2014-01-16 DIAGNOSIS — E538 Deficiency of other specified B group vitamins: Secondary | ICD-10-CM

## 2014-01-16 MED ORDER — HYDROCODONE-ACETAMINOPHEN 10-325 MG PO TABS: 1.0000 | ORAL_TABLET | Freq: Four times a day (QID) | ORAL | Status: DC | PRN

## 2014-01-16 MED ORDER — CYANOCOBALAMIN 1000 MCG/ML IJ SOLN
1000.0000 ug | Freq: Once | INTRAMUSCULAR | Status: AC
  Administered 2014-01-16: 1000 ug via INTRAMUSCULAR

## 2014-01-16 MED ORDER — LOPERAMIDE HCL 2 MG PO CAPS: 4.0000 mg | ORAL_CAPSULE | ORAL | Status: DC | PRN

## 2014-01-16 NOTE — Progress Notes (Signed)
Subjective:    Patient ID: [name], female    DOB: 04/17/1946, 68 y.o.   MRN: [id number]  HPI Pt states 3 weeks of moderate cramps throughout the abdomen, and assoc diarrhea.  No recent antibiotics.  immodium did not help She was given KCl in ER last week She still takes slow-mag. Past Medical History  Diagnosis Date  . Obesity   . PUD (peptic ulcer disease)   . Leukopenia   . Short bowel syndrome   . Internal hemorrhoids without mention of complication   . Stricture and stenosis of esophagus   . Osteoarthrosis, unspecified whether generalized or localized, unspecified site   . Thyrotoxicosis without mention of goiter or other cause, without mention of thyrotoxic crisis or storm   . Other and unspecified hyperlipidemia   . Gout, unspecified   . Diverticulosis of colon (without mention of hemorrhage)   . C. difficile colitis   . VITAMIN B12 DEFICIENCY 08/30/2009  . GOITER, MULTINODULAR 04/02/2009  . HYPOTHYROIDISM, POST-RADIATION 08/13/2009  . ASYMPTOMATIC POSTMENOPAUSAL STATUS 10/11/2008  . Esophageal reflux 06/12/2008  . PONV (postoperative nausea and vomiting)   . Varicose veins   . Blood transfusion   . UTI (urinary tract infection)   . Kidney stones     "several"  . Unspecified essential hypertension   . Angina   . Shortness of breath on exertion     "sometimes"  . ANEMIA, IRON DEFICIENCY 05/08/2009  . History of lower GI bleeding   . H/O hiatal hernia   . Migraines   . Breast cancer 09/29/11    invasive grade III ductal ca,assoc high grade dcis,ER/PR=neg  . History of radiation therapy 02/08/12-03/25/12    left breast,total 61gy  . Blood transfusion without reported diagnosis   . Hypomagnesemia   . Hypokalemia 05/11/2013  . Gastroparesis   . Type II or unspecified type diabetes mellitus without mention of complication, not stated as uncontrolled     no med in years diet controled    Past Surgical History  Procedure Laterality Date  . Vein ligation and  stripping  1980's    Right leg  . Lithotripsy      "4 or 5 times"  . Bunionectomy  1970's    bilateral  . Colonoscopy  2012    multiple   . Esophagogastroduodenoscopy  2011    multiple   . Thyroid ultrasound  12/1994 and 12/1995  . Mastectomy w/ nodes partial  09/29/11    left  . Port a cath placement  09/29/11    right chest  . Breast surgery    . Cholecystectomy  1990's  . Abdominal hysterectomy  1970's    with BSO  . Dilation and curettage of uterus    . Colon surgery      "several surgeries for short bowel syndrome"  . Abdominal adhesion surgery  1980's thru 1990's    "several"  . Kidney stone surgery  1990's    "tried to go up & get it but pushed it further up"  . Portacath placement  09/29/2011    Procedure: INSERTION PORT-A-CATH;  Surgeon: Adin Hector, MD;  Location: [facility];  Service: General;  Laterality: N/A;  . Breast biopsy  08/13/11    left breast lower inner quadrant  . Breast lumpectomy w/ needle localization  09/29/11    left  breast=lymph node,excision benign/ ER/PR=neg, her 2 Positive  . Flexible sigmoidoscopy  2011    multiple   . Port-a-cath removal Right  12/19/2013    Procedure: MINOR REMOVAL PORT-A-CATH;  Surgeon: Adin Hector, MD;  Location: [facility];  Service: General;  Laterality: Right;    History   Social History  . Marital Status: Married    Spouse Name: N/A    Number of Children: 2  . Years of Education: N/A   Occupational History  . Not on file.   Social History Main Topics  . Smoking status: Former Smoker -- 1.00 packs/day for 10 years    Types: Cigarettes    Quit date: 09/22/1985  . Smokeless tobacco: Never Used  . Alcohol Use: No     Comment: 09/29/11 "used to drink socially years ago"  . Drug Use: No  . Sexual Activity: No     Comment: HRT x many yrs   Other Topics Concern  . Not on file   Social History Narrative   Pt gets regular exercise    Current Outpatient Prescriptions on File Prior to Visit    Medication Sig Dispense Refill  . calcium carbonate (TUMS - DOSED IN MG ELEMENTAL CALCIUM) 500 MG chewable tablet Chew 1 tablet by mouth as needed for indigestion or heartburn.      . Calcium Carbonate-Vitamin D (CALCIUM-VITAMIN D) 600-200 MG-UNIT CAPS Take 1 capsule by mouth daily.        . cyanocobalamin (,VITAMIN B-12,) 1000 MCG/ML injection Inject 1,000 mcg into the muscle every 30 (thirty) days. Next one due the 26th of this month      . ergocalciferol (VITAMIN D2) 50000 UNITS capsule Take 1 capsule (50,000 Units total) by mouth once a week.  4 capsule  3  . fluorometholone (FML) 0.1 % ophthalmic suspension Place 1 drop into both eyes 2 (two) times daily.      Marland Kitchen levothyroxine (SYNTHROID, LEVOTHROID) 150 MCG tablet Take 150 mcg by mouth daily before breakfast.      . lovastatin (MEVACOR) 20 MG tablet Take 1 tablet (20 mg total) by mouth daily at 6 PM.  30 tablet  2  . ondansetron (ZOFRAN) 4 MG tablet Take 1 tablet (4 mg total) by mouth every 8 (eight) hours as needed for nausea or vomiting.  20 tablet  0  . pantoprazole (PROTONIX) 40 MG tablet Take 1 tablet 30 minutes before breakfast and supper  60 tablet  11  . potassium chloride SA (K-DUR,KLOR-CON) 20 MEQ tablet Take 1 tablet (20 mEq total) by mouth daily.  30 tablet  1  . spironolactone (ALDACTONE) 25 MG tablet Take 25 mg by mouth daily.      Marland Kitchen thiamine 100 MG tablet Take 100 mg by mouth daily.        Marland Kitchen zolpidem (AMBIEN) 5 MG tablet Take 1 tablet (5 mg total) by mouth at bedtime as needed for sleep.  30 tablet  5  . calcium gluconate 650 MG tablet Take 1 tablet (650 mg total) by mouth daily.  30 tablet  2   Current Facility-Administered Medications on File Prior to Visit  Medication Dose Route Frequency Provider Last Rate Last Dose  . acetaminophen (TYLENOL) tablet 1,000 mg  1,000 mg Oral Once Amada Kingfisher, MD        Allergies  Allergen Reactions  . Aspirin Other (See Comments)    REACTION: Gi Intolerance/ Burning in stomach  .  Codeine Itching  . Flagyl [Metronidazole] Rash  . Iodine Itching    Allergic to IVP dye  . Morphine And Related Itching    Family History  Problem  Relation Age of Onset  . Esophageal cancer Son     deceased  . Cancer Son     Esophageal Cancer  . Diabetes Mother   . Heart disease Mother   . Colon cancer Paternal Uncle   . Cancer Paternal Uncle     Colon Cancer  . Kidney disease Sister   . Diabetes Father   . Hypertension Father   . Kidney disease Brother   . Kidney disease Brother   . Kidney disease Brother     BP 110/68  Pulse 78  Temp(Src) 98.1 F (36.7 C) (Oral)  Ht 5\' 5"  (1.651 m)  Wt 191 lb (86.637 kg)  BMI 31.78 kg/m2  SpO2 98%  Review of Systems Denies BRBPR.  Nausea persists, but denies vomiting.     Objective:   Physical Exam VITAL SIGNS:  See vs page GENERAL: no distress ABDOMEN: abdomen is soft, nontender.  no hepatosplenomegaly. not distended.  no hernia      Assessment & Plan:  Diarrhea, persistent, uncertain etiology Allergy to flagyl.  This limits rx options for diarrhea Hypomagnesemia: uncertain etiology.  She should see nephrology if she hasn't already. Hypokalemia: persistent, ? Renal leak.     Patient is advised the following: Patient Instructions  blood tests are being requested for you today.  We'll contact you with results. Please try stopping the slow-mag. i have sent a prescription to your pharmacy, to change the imodium to "lomotil."

## 2014-01-16 NOTE — Patient Instructions (Addendum)
blood tests are being requested for you today.  We'll contact you with results. Please try stopping the slow-mag. i have sent a prescription to your pharmacy, to change the imodium to "lomotil."

## 2014-01-17 LAB — BASIC METABOLIC PANEL
BUN: 10 mg/dL (ref 6–23)
CO2: 28 mEq/L (ref 19–32)
Calcium: 9.4 mg/dL (ref 8.4–10.5)
Chloride: 104 mEq/L (ref 96–112)
Creatinine, Ser: 1 mg/dL (ref 0.4–1.2)
GFR: 74.84 mL/min (ref >60.00)
Glucose, Bld: 82 mg/dL (ref 70–99)
POTASSIUM: 3.3 meq/L — AB (ref 3.5–5.1)
Sodium: 140 mEq/L (ref 135–145)

## 2014-01-17 LAB — VITAMIN D 25 HYDROXY (VIT D DEFICIENCY, FRACTURES): VITD: 21.08 ng/mL — ABNORMAL LOW (ref 30.00–100.00)

## 2014-01-17 LAB — MAGNESIUM: MAGNESIUM: 1 mg/dL — AB (ref 1.5–2.5)

## 2014-01-18 ENCOUNTER — Ambulatory Visit (INDEPENDENT_AMBULATORY_CARE_PROVIDER_SITE_OTHER): Payer: Medicare Other | Admitting: General Surgery

## 2014-01-18 ENCOUNTER — Telehealth (INDEPENDENT_AMBULATORY_CARE_PROVIDER_SITE_OTHER): Payer: Self-pay

## 2014-01-18 ENCOUNTER — Encounter (INDEPENDENT_AMBULATORY_CARE_PROVIDER_SITE_OTHER): Payer: Self-pay | Admitting: General Surgery

## 2014-01-18 VITALS — BP 102/78 | HR 70 | Temp 97.6°F | Resp 18 | Ht 65.0 in | Wt 190.0 lb

## 2014-01-18 DIAGNOSIS — R109 Unspecified abdominal pain: Secondary | ICD-10-CM

## 2014-01-18 DIAGNOSIS — R197 Diarrhea, unspecified: Secondary | ICD-10-CM

## 2014-01-18 DIAGNOSIS — C50919 Malignant neoplasm of unspecified site of unspecified female breast: Secondary | ICD-10-CM

## 2014-01-18 DIAGNOSIS — R1084 Generalized abdominal pain: Secondary | ICD-10-CM

## 2014-01-18 DIAGNOSIS — C50912 Malignant neoplasm of unspecified site of left female breast: Secondary | ICD-10-CM

## 2014-01-18 NOTE — Patient Instructions (Signed)
The fluid collection in your right lower quadrant is a little bit larger than it was last year, but it appears benign. I do not think the fluid collection in the right lower quadrant is causing your diarrhea, and I do not think that it is causing your chronic left lower quadrant pain.  We could could consider needle aspiration of this fluid, but if we punctured the intestine it could lead to an emergency operation, and that would be very dangerous in your individual situation.  I think that the risk of this being an occult cancer is much less than the risk of invasive procedures at this point in time.  I recommend that you followup with me in 6 months and we will repeat her CT scan at that time. If the fluid collection continues to enlarge we will be forced to perform a needle aspiration.  You are referred back to Dr. Lucio Edward for colonoscopy and further evaluation of your diarrhea.

## 2014-01-18 NOTE — Progress Notes (Signed)
Patient ID: [name], female   DOB: [birth year], 68 y.o.   MRN: [id number]  Chief Complaint  Patient presents with  . New Evaluation    evaluate intra abdominal fluid collection    HPI [name] is a 68 y.o. female.  It appears that she was referred to me by the emergency department for evaluation of left lower quadrant abdominal pain, right lower quadrant chronic fluid collection, and diarrhea.  This patient is well known to me.  She coincidentally is a breast cancer patient of mine who underwent left partial mastectomy, sentinel lymph node biopsy and Port-A-Cath insertion on 09/29/2011. She recovered from that surgery. She had a receptor negative, HER-2 positive invasive ductal carcinoma, stage TI B., N0, BRCA negative. She continues to be followed regularly  by Medical Oncology. She has no complaints about her breast. Bilateral mammograms on 10/14/2013 looked good without focal abnormality.  .  She has a long history of abdominal problems. She had a hysterectomy and BSO in 1970 for benign disease. She's had 3 or 4 laparotomies by Dr. Bubba Camp and Dr. Lindon Romp for SBO. She states she's had small bowel resection several times. She's had an open cholecystectomy. In [place] attempted laparotomy but aborted the laparotomy due to "concrete abdomen. She felt the abdomen could not be explored. Several enterotomies occurred and she was placed on TNA and she ultimately slowly recovered. She denies any history of colon resection. She has problems with chronic bacterial overgrowth and diarrhea . She has chronic abdominal pain .  This is mostly in the left lower quadrant.  Last colonoscopy 2012. Last upper endoscopy for esophagitis by [name] on 09/05/2013.  She recently presented to the emergency department because of left lower quadrant pain. She says her main problem rarely has diarrhea and not so much necrotic left lower quadrant pain. CT scan showed a benign appearing fluid  collection right lower quadrant, somewhat larger than it was one year ago. Loss of metallic clips present. No inflammatory change.She had some vomiting. Lab work was normal. GI was called. Surgery was called. She was discharged home. Stool for all GI pathogens was negative. She was asked to see me.  She says that she is eating well. She has chronic diarrhea which is her biggest problem. She has chronic left lower quadrant pain. She denies right lower quadrant pain. Her weight has been stable. Her husband is with her today.    HPI  Past Medical History  Diagnosis Date  . Obesity   . PUD (peptic ulcer disease)   . Leukopenia   . Short bowel syndrome   . Internal hemorrhoids without mention of complication   . Stricture and stenosis of esophagus   . Osteoarthrosis, unspecified whether generalized or localized, unspecified site   . Thyrotoxicosis without mention of goiter or other cause, without mention of thyrotoxic crisis or storm   . Other and unspecified hyperlipidemia   . Gout, unspecified   . Diverticulosis of colon (without mention of hemorrhage)   . C. difficile colitis   . VITAMIN B12 DEFICIENCY 08/30/2009  . GOITER, MULTINODULAR 04/02/2009  . HYPOTHYROIDISM, POST-RADIATION 08/13/2009  . ASYMPTOMATIC POSTMENOPAUSAL STATUS 10/11/2008  . Esophageal reflux 06/12/2008  . PONV (postoperative nausea and vomiting)   . Varicose veins   . Blood transfusion   . UTI (urinary tract infection)   . Kidney stones     "several"  . Unspecified essential hypertension   . Angina   . Shortness of breath on  exertion     "sometimes"  . ANEMIA, IRON DEFICIENCY 05/08/2009  . History of lower GI bleeding   . H/O hiatal hernia   . Migraines   . Breast cancer 09/29/11    invasive grade III ductal ca,assoc high grade dcis,ER/PR=neg  . History of radiation therapy 02/08/12-03/25/12    left breast,total 61gy  . Blood transfusion without reported diagnosis   . Hypomagnesemia   . Hypokalemia 05/11/2013  .  Gastroparesis   . Type II or unspecified type diabetes mellitus without mention of complication, not stated as uncontrolled     no med in years diet controled    Past Surgical History  Procedure Laterality Date  . Vein ligation and stripping  1980's    Right leg  . Lithotripsy      "4 or 5 times"  . Bunionectomy  1970's    bilateral  . Colonoscopy  2012    multiple   . Esophagogastroduodenoscopy  2011    multiple   . Thyroid ultrasound  12/1994 and 12/1995  . Mastectomy w/ nodes partial  09/29/11    left  . Port a cath placement  09/29/11    right chest  . Breast surgery    . Cholecystectomy  1990's  . Abdominal hysterectomy  1970's    with BSO  . Dilation and curettage of uterus    . Colon surgery      "several surgeries for short bowel syndrome"  . Abdominal adhesion surgery  1980's thru 1990's    "several"  . Kidney stone surgery  1990's    "tried to go up & get it but pushed it further up"  . Portacath placement  09/29/2011    Procedure: INSERTION PORT-A-CATH;  Surgeon: Adin Hector, MD;  Location: [facility];  Service: General;  Laterality: N/A;  . Breast biopsy  08/13/11    left breast lower inner quadrant  . Breast lumpectomy w/ needle localization  09/29/11    left  breast=lymph node,excision benign/ ER/PR=neg, her 2 Positive  . Flexible sigmoidoscopy  2011    multiple   . Port-a-cath removal Right 12/19/2013    Procedure: MINOR REMOVAL PORT-A-CATH;  Surgeon: Adin Hector, MD;  Location: [facility];  Service: General;  Laterality: Right;    Family History  Problem Relation Age of Onset  . Esophageal cancer Son     deceased  . Cancer Son     Esophageal Cancer  . Diabetes Mother   . Heart disease Mother   . Colon cancer Paternal Uncle   . Cancer Paternal Uncle     Colon Cancer  . Kidney disease Sister   . Diabetes Father   . Hypertension Father   . Kidney disease Brother   . Kidney disease Brother   . Kidney disease Brother     Social  History History  Substance Use Topics  . Smoking status: Former Smoker -- 1.00 packs/day for 10 years    Types: Cigarettes    Quit date: 09/22/1985  . Smokeless tobacco: Never Used  . Alcohol Use: No     Comment: 09/29/11 "used to drink socially years ago"    Allergies  Allergen Reactions  . Aspirin Other (See Comments)    REACTION: Gi Intolerance/ Burning in stomach  . Codeine Itching  . Flagyl [Metronidazole] Rash  . Iodine Itching    Allergic to IVP dye  . Morphine And Related Itching    Current Outpatient Prescriptions  Medication Sig Dispense Refill  .  calcium carbonate (TUMS - DOSED IN MG ELEMENTAL CALCIUM) 500 MG chewable tablet Chew 1 tablet by mouth as needed for indigestion or heartburn.      . Calcium Carbonate-Vitamin D (CALCIUM-VITAMIN D) 600-200 MG-UNIT CAPS Take 1 capsule by mouth daily.        . cyanocobalamin (,VITAMIN B-12,) 1000 MCG/ML injection Inject 1,000 mcg into the muscle every 30 (thirty) days. Next one due the 26th of this month      . ergocalciferol (VITAMIN D2) 50000 UNITS capsule Take 1 capsule (50,000 Units total) by mouth once a week.  4 capsule  3  . fluorometholone (FML) 0.1 % ophthalmic suspension Place 1 drop into both eyes 2 (two) times daily.      Marland Kitchen HYDROcodone-acetaminophen (NORCO) 10-325 MG per tablet Take 1 tablet by mouth every 6 (six) hours as needed for moderate pain.  50 tablet  0  . levothyroxine (SYNTHROID, LEVOTHROID) 150 MCG tablet Take 150 mcg by mouth daily before breakfast.      . loperamide (IMODIUM) 2 MG capsule Take 2 capsules (4 mg total) by mouth every 4 (four) hours as needed for diarrhea or loose stools.  30 capsule  0  . lovastatin (MEVACOR) 20 MG tablet Take 1 tablet (20 mg total) by mouth daily at 6 PM.  30 tablet  2  . ondansetron (ZOFRAN) 4 MG tablet Take 1 tablet (4 mg total) by mouth every 8 (eight) hours as needed for nausea or vomiting.  20 tablet  0  . pantoprazole (PROTONIX) 40 MG tablet Take 1 tablet 30 minutes  before breakfast and supper  60 tablet  11  . potassium chloride SA (K-DUR,KLOR-CON) 20 MEQ tablet Take 1 tablet (20 mEq total) by mouth daily.  30 tablet  1  . spironolactone (ALDACTONE) 25 MG tablet Take 25 mg by mouth daily.      Marland Kitchen thiamine 100 MG tablet Take 100 mg by mouth daily.        Marland Kitchen zolpidem (AMBIEN) 5 MG tablet Take 1 tablet (5 mg total) by mouth at bedtime as needed for sleep.  30 tablet  5  . calcium gluconate 650 MG tablet Take 1 tablet (650 mg total) by mouth daily.  30 tablet  2   No current facility-administered medications for this visit.   Facility-Administered Medications Ordered in Other Visits  Medication Dose Route Frequency Provider Last Rate Last Dose  . acetaminophen (TYLENOL) tablet 1,000 mg  1,000 mg Oral Once Amada Kingfisher, MD        Review of Systems Review of Systems  Constitutional: Negative for fever, chills and unexpected weight change.  HENT: Negative for congestion, hearing loss, sore throat, trouble swallowing and voice change.   Eyes: Negative for visual disturbance.  Respiratory: Negative for cough and wheezing.   Cardiovascular: Negative for chest pain, palpitations and leg swelling.  Gastrointestinal: Positive for vomiting, abdominal pain and diarrhea. Negative for nausea, constipation, blood in stool, abdominal distention and anal bleeding.  Genitourinary: Negative for hematuria, vaginal bleeding and difficulty urinating.  Musculoskeletal: Negative for arthralgias.  Skin: Negative for rash and wound.  Neurological: Negative for seizures, syncope and headaches.  Hematological: Negative for adenopathy. Does not bruise/bleed easily.  Psychiatric/Behavioral: Negative for confusion.    Blood pressure 102/78, pulse 70, temperature 97.6 F (36.4 C), resp. rate 18, height 5' 5"  (1.651 m), weight 190 lb (86.183 kg).  Physical Exam Physical Exam Constitutional: She is oriented to person, place, and time. She appears well-developed and well-nourished.  No distress.  HENT:  Head: Normocephalic and atraumatic.  Nose: Nose normal.  Mouth/Throat: No oropharyngeal exudate.  Eyes: Conjunctivae and EOM are normal. Pupils are equal, round, and reactive to light. Left eye exhibits no discharge. No scleral icterus.  Neck: Neck supple. No JVD present. No tracheal deviation present. No thyromegaly present.  Cardiovascular: Normal rate, regular rhythm, normal heart sounds and intact distal pulses.  No murmur heard.  Pulmonary/Chest: Effort normal and breath sounds normal. No respiratory distress. She has no wheezes. She has no rales. She exhibits no tenderness.  Breast exam declined  Abdominal: Soft. Bowel sounds are normal. She exhibits no distension and no mass. There is no tenderness. There is no rebound and no guarding.    Multiple scars. No hernias. Tender left lower quadrant but no mass, no guarding, no hernia. Right lower quadrant is nontender. No mass. No hernia or mass in the abdomen or inguinal areas. Liver and spleen not enlarged. Not distended.  Musculoskeletal: She exhibits no edema and no tenderness.  Lymphadenopathy:  She has no cervical adenopathy.  Neurological: She is alert and oriented to person, place, and time. She exhibits normal muscle tone. Coordination normal.  Skin: Skin is warm. No rash noted. She is not diaphoretic. No erythema. No pallor.  Psychiatric: She has a normal mood and affect. Her behavior is normal. Judgment and thought content normal.   Data Reviewed Physician minutes. Lab work and CT scan. Our old records.  Assessment    Chronic diarrhea. 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 Possible bacterial overgrowth syndrome.  Past history C. Difficile colitis  Left lower quadrant pain, probably secondary to adhesions, chronic.  Right lower quadrant fluid collection. Although this is a little bit larger than it was a year ago, he still is thin-walled and appears benign. No evidence of mass or malignancy  seen.       Plan    I do not think that there is any indication for abdominal surgery or abdominal exploration. I think this would be ill advised considering prior descriptions of her adhesions. I told her that abdominal surgery should be attempted only life or death situation, in my opinion. Second opinion was offered  I told her that consideration could be given to needle aspiration of the fluid collection in her right lower quadrant for culture and cytology, but I felt that at this time the risk of that was greater than the benefit. Perforation of the intestine during the procedure could precipitate surgical intervention which would be very problematic. She agrees.  At this time we decided to repeat her CT scan in 6 months. If the fluid collection  continues to enlarge we may be forced to needle aspirate for cytology and culture  History of breast cancer.  Status post left partial mastectomy, sentinel lymph node biopsy and Port-A-Cath insertion on 09/29/2011. 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  I will perform a breast exam in 6 months  Repeat mammograms in May of 2016.  She is referred to [name] for further evaluation of her chronic diarrhea and consideration of followup colonoscopy.        Edsel Petrin. Dalbert Batman, M.D., [facility] General and Minimally invasive Surgery Breast and Colorectal Surgery Office:   [phone] Pager:   [phone]  01/18/2014, 1:17 PM

## 2014-01-18 NOTE — Telephone Encounter (Signed)
Order for CT with contrast in epic for Feb 2016 per Dr Audie Pinto request. Order for referral to Dr Fuller Plan to have colonscopy in epic. Both orders to ref coord to set up and call pt. Pt to follow up with Dr Dalbert Batman in 6 mo .

## 2014-01-20 ENCOUNTER — Encounter: Payer: Self-pay | Admitting: Gastroenterology

## 2014-01-22 ENCOUNTER — Ambulatory Visit: Payer: Medicare Other

## 2014-01-25 ENCOUNTER — Other Ambulatory Visit (INDEPENDENT_AMBULATORY_CARE_PROVIDER_SITE_OTHER): Payer: Self-pay | Admitting: *Deleted

## 2014-01-26 ENCOUNTER — Other Ambulatory Visit: Payer: Self-pay | Admitting: Endocrinology

## 2014-01-26 DIAGNOSIS — E119 Type 2 diabetes mellitus without complications: Secondary | ICD-10-CM

## 2014-02-01 ENCOUNTER — Ambulatory Visit (HOSPITAL_COMMUNITY)
Admission: RE | Admit: 2014-02-01 | Discharge: 2014-02-01 | Disposition: A | Discharge status: Home / Self Care | Payer: Medicare Other | Source: Ambulatory Visit | Attending: Endocrinology | Admitting: Endocrinology

## 2014-02-01 ENCOUNTER — Ambulatory Visit (INDEPENDENT_AMBULATORY_CARE_PROVIDER_SITE_OTHER): Payer: Medicare Other | Admitting: Endocrinology

## 2014-02-01 ENCOUNTER — Telehealth: Payer: Self-pay

## 2014-02-01 ENCOUNTER — Other Ambulatory Visit: Payer: Self-pay | Admitting: Endocrinology

## 2014-02-01 ENCOUNTER — Encounter: Payer: Self-pay | Admitting: Endocrinology

## 2014-02-01 DIAGNOSIS — M7989 Other specified soft tissue disorders: Secondary | ICD-10-CM

## 2014-02-01 DIAGNOSIS — M25569 Pain in unspecified knee: Secondary | ICD-10-CM | POA: Insufficient documentation

## 2014-02-01 DIAGNOSIS — M25561 Pain in right knee: Secondary | ICD-10-CM

## 2014-02-01 DIAGNOSIS — M79609 Pain in unspecified limb: Secondary | ICD-10-CM | POA: Insufficient documentation

## 2014-02-01 DIAGNOSIS — Z23 Encounter for immunization: Secondary | ICD-10-CM

## 2014-02-01 NOTE — Progress Notes (Signed)
Subjective:    Patient ID: [name], female    DOB: [birth year], 68 y.o.   MRN: [id number]  HPI Pt states of moderate pain at the right leg, and assoc swelling.  She had vein stripping there many years ago.    Past Medical History  Diagnosis Date  . Obesity   . PUD (peptic ulcer disease)   . Leukopenia   . Short bowel syndrome   . Internal hemorrhoids without mention of complication   . Stricture and stenosis of esophagus   . Osteoarthrosis, unspecified whether generalized or localized, unspecified site   . Thyrotoxicosis without mention of goiter or other cause, without mention of thyrotoxic crisis or storm   . Other and unspecified hyperlipidemia   . Gout, unspecified   . Diverticulosis of colon (without mention of hemorrhage)   . C. difficile colitis   . VITAMIN B12 DEFICIENCY 08/30/2009  . GOITER, MULTINODULAR 04/02/2009  . HYPOTHYROIDISM, POST-RADIATION 08/13/2009  . ASYMPTOMATIC POSTMENOPAUSAL STATUS 10/11/2008  . Esophageal reflux 06/12/2008  . PONV (postoperative nausea and vomiting)   . Varicose veins   . Blood transfusion   . UTI (urinary tract infection)   . Kidney stones     "several"  . Unspecified essential hypertension   . Angina   . Shortness of breath on exertion     "sometimes"  . ANEMIA, IRON DEFICIENCY 05/08/2009  . History of lower GI bleeding   . H/O hiatal hernia   . Migraines   . Breast cancer 09/29/11    invasive grade III ductal ca,assoc high grade dcis,ER/PR=neg  . History of radiation therapy 02/08/12-03/25/12    left breast,total 61gy  . Blood transfusion without reported diagnosis   . Hypomagnesemia   . Hypokalemia 05/11/2013  . Gastroparesis   . Type II or unspecified type diabetes mellitus without mention of complication, not stated as uncontrolled     no med in years diet controled    Past Surgical History  Procedure Laterality Date  . Vein ligation and stripping  1980's    Right leg  . Lithotripsy      "4 or 5 times"  .  Bunionectomy  1970's    bilateral  . Colonoscopy  2012    multiple   . Esophagogastroduodenoscopy  2011    multiple   . Thyroid ultrasound  12/1994 and 12/1995  . Mastectomy w/ nodes partial  09/29/11    left  . Port a cath placement  09/29/11    right chest  . Breast surgery    . Cholecystectomy  1990's  . Abdominal hysterectomy  1970's    with BSO  . Dilation and curettage of uterus    . Colon surgery      "several surgeries for short bowel syndrome"  . Abdominal adhesion surgery  1980's thru 1990's    "several"  . Kidney stone surgery  1990's    "tried to go up & get it but pushed it further up"  . Portacath placement  09/29/2011    Procedure: INSERTION PORT-A-CATH;  Surgeon: Adin Hector, MD;  Location: [facility];  Service: General;  Laterality: N/A;  . Breast biopsy  08/13/11    left breast lower inner quadrant  . Breast lumpectomy w/ needle localization  09/29/11    left  breast=lymph node,excision benign/ ER/PR=neg, her 2 Positive  . Flexible sigmoidoscopy  2011    multiple   . Port-a-cath removal Right 12/19/2013    Procedure: MINOR REMOVAL PORT-A-CATH;  Surgeon:  Adin Hector, MD;  Location: [facility];  Service: General;  Laterality: Right;    History   Social History  . Marital Status: Married    Spouse Name: N/A    Number of Children: 2  . Years of Education: N/A   Occupational History  . Not on file.   Social History Main Topics  . Smoking status: Former Smoker -- 1.00 packs/day for 10 years    Types: Cigarettes    Quit date: 09/22/1985  . Smokeless tobacco: Never Used  . Alcohol Use: No     Comment: 09/29/11 "used to drink socially years ago"  . Drug Use: No  . Sexual Activity: No     Comment: HRT x many yrs   Other Topics Concern  . Not on file   Social History Narrative   Pt gets regular exercise    Current Outpatient Prescriptions on File Prior to Visit  Medication Sig Dispense Refill  . calcium carbonate (TUMS - DOSED IN MG  ELEMENTAL CALCIUM) 500 MG chewable tablet Chew 1 tablet by mouth as needed for indigestion or heartburn.      . Calcium Carbonate-Vitamin D (CALCIUM-VITAMIN D) 600-200 MG-UNIT CAPS Take 1 capsule by mouth daily.        . cyanocobalamin (,VITAMIN B-12,) 1000 MCG/ML injection Inject 1,000 mcg into the muscle every 30 (thirty) days. Next one due the 26th of this month      . ergocalciferol (VITAMIN D2) 50000 UNITS capsule Take 1 capsule (50,000 Units total) by mouth once a week.  4 capsule  3  . fluorometholone (FML) 0.1 % ophthalmic suspension Place 1 drop into both eyes 2 (two) times daily.      Marland Kitchen HYDROcodone-acetaminophen (NORCO) 10-325 MG per tablet Take 1 tablet by mouth every 6 (six) hours as needed for moderate pain.  50 tablet  0  . levothyroxine (SYNTHROID, LEVOTHROID) 150 MCG tablet Take 150 mcg by mouth daily before breakfast.      . loperamide (IMODIUM) 2 MG capsule Take 2 capsules (4 mg total) by mouth every 4 (four) hours as needed for diarrhea or loose stools.  30 capsule  0  . ondansetron (ZOFRAN) 4 MG tablet Take 1 tablet (4 mg total) by mouth every 8 (eight) hours as needed for nausea or vomiting.  20 tablet  0  . pantoprazole (PROTONIX) 40 MG tablet Take 1 tablet 30 minutes before breakfast and supper  60 tablet  11  . potassium chloride SA (K-DUR,KLOR-CON) 20 MEQ tablet Take 1 tablet (20 mEq total) by mouth daily.  30 tablet  1  . spironolactone (ALDACTONE) 25 MG tablet Take 25 mg by mouth daily.      Marland Kitchen thiamine 100 MG tablet Take 100 mg by mouth daily.        Marland Kitchen zolpidem (AMBIEN) 5 MG tablet Take 1 tablet (5 mg total) by mouth at bedtime as needed for sleep.  30 tablet  5  . calcium gluconate 650 MG tablet Take 1 tablet (650 mg total) by mouth daily.  30 tablet  2   Current Facility-Administered Medications on File Prior to Visit  Medication Dose Route Frequency Provider Last Rate Last Dose  . acetaminophen (TYLENOL) tablet 1,000 mg  1,000 mg Oral Once Amada Kingfisher, MD         Allergies  Allergen Reactions  . Aspirin Other (See Comments)    REACTION: Gi Intolerance/ Burning in stomach  . Codeine Itching  . Flagyl [Metronidazole] Rash  .  Iodine Itching    Allergic to IVP dye  . Morphine And Related Itching    Family History  Problem Relation Age of Onset  . Esophageal cancer Son     deceased  . Cancer Son     Esophageal Cancer  . Diabetes Mother   . Heart disease Mother   . Colon cancer Paternal Uncle   . Cancer Paternal Uncle     Colon Cancer  . Kidney disease Sister   . Diabetes Father   . Hypertension Father   . Kidney disease Brother   . Kidney disease Brother   . Kidney disease Brother     BP 126/90  Pulse 73  Temp(Src) 98 F (36.7 C) (Oral)  Ht 5\' 5"  (1.651 m)  Wt 188 lb (85.276 kg)  BMI 31.28 kg/m2  SpO2 99%   Review of Systems Diarrhea is resolved.  Denies sob.    Objective:   Physical Exam VITAL SIGNS:  See vs page GENERAL: no distress Right leg: no edema.  Slight tenderness at the anterior tibial area.    (venous doppler: neg)    Assessment & Plan:  Leg pain, new, uncertain etiology Diarrhea, uncertain etiology, resolved.   Hypomagnesemia: pt is advised she can resume the oral Mg++ supplement.   Patient is advised the following: Patient Instructions  Please see a kidney specialist.  you will receive a phone call, about a day and time for an appointment. Let's check for a blood clot.  Please call if you want to see Dr Alfonso Ramus about the leg pain.   Please come back for a follow-up appointment in 2 months.

## 2014-02-01 NOTE — Telephone Encounter (Signed)
done

## 2014-02-01 NOTE — Patient Instructions (Addendum)
Please see a kidney specialist.  you will receive a phone call, about a day and time for an appointment. Let's check for a blood clot.  Please call if you want to see Dr Alfonso Ramus about the leg pain.   Please come back for a follow-up appointment in 2 months.

## 2014-02-01 NOTE — Progress Notes (Signed)
*  PRELIMINARY RESULTS* Vascular Ultrasound Right lower extremity venous duplex has been completed.  Preliminary findings: Right:  No evidence of DVT, superficial thrombosis, or Baker's cyst.   [name] FRANCES 02/01/2014, 12:00 PM

## 2014-02-01 NOTE — Telephone Encounter (Signed)
Lab called about Doppler. Doppler was negative. Report says that blood is sluggish. Results will be release in epic. Pt is requesting letter for work.  Please advise, Thanks!

## 2014-02-12 ENCOUNTER — Other Ambulatory Visit: Payer: Self-pay

## 2014-02-12 DIAGNOSIS — C50319 Malignant neoplasm of lower-inner quadrant of unspecified female breast: Secondary | ICD-10-CM

## 2014-02-13 ENCOUNTER — Encounter: Payer: Self-pay | Admitting: *Deleted

## 2014-02-13 ENCOUNTER — Encounter: Payer: Self-pay | Admitting: Adult Health

## 2014-02-13 ENCOUNTER — Ambulatory Visit (HOSPITAL_BASED_OUTPATIENT_CLINIC_OR_DEPARTMENT_OTHER): Payer: Medicare Other | Admitting: Adult Health

## 2014-02-13 ENCOUNTER — Other Ambulatory Visit (HOSPITAL_BASED_OUTPATIENT_CLINIC_OR_DEPARTMENT_OTHER): Payer: Medicare Other

## 2014-02-13 ENCOUNTER — Ambulatory Visit (HOSPITAL_BASED_OUTPATIENT_CLINIC_OR_DEPARTMENT_OTHER): Payer: Medicare Other

## 2014-02-13 ENCOUNTER — Telehealth: Payer: Self-pay | Admitting: Hematology and Oncology

## 2014-02-13 VITALS — BP 113/72 | HR 63 | Temp 97.8°F | Resp 20 | Ht 65.0 in | Wt 191.0 lb

## 2014-02-13 DIAGNOSIS — Z853 Personal history of malignant neoplasm of breast: Secondary | ICD-10-CM

## 2014-02-13 DIAGNOSIS — D649 Anemia, unspecified: Secondary | ICD-10-CM

## 2014-02-13 DIAGNOSIS — C50319 Malignant neoplasm of lower-inner quadrant of unspecified female breast: Secondary | ICD-10-CM

## 2014-02-13 DIAGNOSIS — E2839 Other primary ovarian failure: Secondary | ICD-10-CM

## 2014-02-13 LAB — CBC WITH DIFFERENTIAL/PLATELET
BASO%: 0.5 % (ref 0.0–2.0)
Basophils Absolute: 0 10*3/uL (ref 0.0–0.1)
EOS%: 2.1 % (ref 0.0–7.0)
Eosinophils Absolute: 0.1 10*3/uL (ref 0.0–0.5)
HCT: 31.8 % — ABNORMAL LOW (ref 34.8–46.6)
HGB: 10.2 g/dL — ABNORMAL LOW (ref 11.6–15.9)
LYMPH%: 35.7 % (ref 14.0–49.7)
MCH: 29.3 pg (ref 25.1–34.0)
MCHC: 32.2 g/dL (ref 31.5–36.0)
MCV: 91 fL (ref 79.5–101.0)
MONO#: 0.3 10*3/uL (ref 0.1–0.9)
MONO%: 9 % (ref 0.0–14.0)
NEUT%: 52.7 % (ref 38.4–76.8)
NEUTROS ABS: 1.9 10*3/uL (ref 1.5–6.5)
Platelets: 206 10*3/uL (ref 145–400)
RBC: 3.49 10*6/uL — AB (ref 3.70–5.45)
RDW: 13.5 % (ref 11.2–14.5)
WBC: 3.7 10*3/uL — ABNORMAL LOW (ref 3.9–10.3)
lymph#: 1.3 10*3/uL (ref 0.9–3.3)

## 2014-02-13 LAB — RETICULOCYTES
IMMATURE RETIC FRACT: 7.3 % (ref 1.60–10.00)
RBC: 3.62 10*6/uL — AB (ref 3.70–5.45)
RETIC %: 1.81 % (ref 0.70–2.10)
Retic Ct Abs: 65.52 10*3/uL (ref 33.70–90.70)

## 2014-02-13 LAB — COMPREHENSIVE METABOLIC PANEL (CC13)
ALBUMIN: 3.5 g/dL (ref 3.5–5.0)
ALT: 17 U/L (ref 0–55)
AST: 20 U/L (ref 5–34)
Alkaline Phosphatase: 69 U/L (ref 40–150)
Anion Gap: 9 mEq/L (ref 3–11)
BUN: 9.9 mg/dL (ref 7.0–26.0)
CO2: 25 mEq/L (ref 22–29)
Calcium: 9.1 mg/dL (ref 8.4–10.4)
Chloride: 109 mEq/L (ref 98–109)
Creatinine: 1 mg/dL (ref 0.6–1.1)
Glucose: 75 mg/dl (ref 70–140)
POTASSIUM: 3.4 meq/L — AB (ref 3.5–5.1)
Sodium: 143 mEq/L (ref 136–145)
Total Bilirubin: 0.78 mg/dL (ref 0.20–1.20)
Total Protein: 7 g/dL (ref 6.4–8.3)

## 2014-02-13 LAB — IRON AND TIBC CHCC
%SAT: 27 % (ref 21–57)
Iron: 96 ug/dL (ref 41–142)
TIBC: 358 ug/dL (ref 236–444)
UIBC: 262 ug/dL (ref 120–384)

## 2014-02-13 LAB — FERRITIN CHCC: Ferritin: 227 ng/ml (ref 9–269)

## 2014-02-13 NOTE — Telephone Encounter (Signed)
, °

## 2014-02-13 NOTE — Patient Instructions (Signed)
You are doing well and have no sign of recurrence.  I recommend healthy diet, exercise, and monthly breast exams.    We will see you back in 6 months.    Breast Self-Awareness Practicing breast self-awareness may pick up problems early, prevent significant medical complications, and possibly save your life. By practicing breast self-awareness, you can become familiar with how your breasts look and feel and if your breasts are changing. This allows you to notice changes early. It can also offer you some reassurance that your breast health is good. One way to learn what is normal for your breasts and whether your breasts are changing is to do a breast self-exam. If you find a lump or something that was not present in the past, it is best to contact your caregiver right away. Other findings that should be evaluated by your caregiver include nipple discharge, especially if it is bloody; skin changes or reddening; areas where the skin seems to be pulled in (retracted); or new lumps and bumps. Breast pain is seldom associated with cancer (malignancy), but should also be evaluated by a caregiver. HOW TO PERFORM A BREAST SELF-EXAM The best time to examine your breasts is 5-7 days after your menstrual period is over. During menstruation, the breasts are lumpier, and it may be more difficult to pick up changes. If you do not menstruate, have reached menopause, or had your uterus removed (hysterectomy), you should examine your breasts at regular intervals, such as monthly. If you are breastfeeding, examine your breasts after a feeding or after using a breast pump. Breast implants do not decrease the risk for lumps or tumors, so continue to perform breast self-exams as recommended. Talk to your caregiver about how to determine the difference between the implant and breast tissue. Also, talk about the amount of pressure you should use during the exam. Over time, you will become more familiar with the variations of your  breasts and more comfortable with the exam. A breast self-exam requires you to remove all your clothes above the waist. 1. Look at your breasts and nipples. Stand in front of a mirror in a room with good lighting. With your hands on your hips, push your hands firmly downward. Look for a difference in shape, contour, and size from one breast to the other (asymmetry). Asymmetry includes puckers, dips, or bumps. Also, look for skin changes, such as reddened or scaly areas on the breasts. Look for nipple changes, such as discharge, dimpling, repositioning, or redness. 2. Carefully feel your breasts. This is best done either in the shower or tub while using soapy water or when flat on your back. Place the arm (on the side of the breast you are examining) above your head. Use the pads (not the fingertips) of your three middle fingers on your opposite hand to feel your breasts. Start in the underarm area and use  inch (2 cm) overlapping circles to feel your breast. Use 3 different levels of pressure (light, medium, and firm pressure) at each circle before moving to the next circle. The light pressure is needed to feel the tissue closest to the skin. The medium pressure will help to feel breast tissue a little deeper, while the firm pressure is needed to feel the tissue close to the ribs. Continue the overlapping circles, moving downward over the breast until you feel your ribs below your breast. Then, move one finger-width towards the center of the body. Continue to use the  inch (2 cm) overlapping circles  to feel your breast as you move slowly up toward the collar bone (clavicle) near the base of the neck. Continue the up and down exam using all 3 pressures until you reach the middle of the chest. Do this with each breast, carefully feeling for lumps or changes. 3.  Keep a written record with breast changes or normal findings for each breast. By writing this information down, you do not need to depend only on memory  for size, tenderness, or location. Write down where you are in your menstrual cycle, if you are still menstruating. Breast tissue can have some lumps or thick tissue. However, see your caregiver if you find anything that concerns you.  SEEK MEDICAL CARE IF:  You see a change in shape, contour, or size of your breasts or nipples.   You see skin changes, such as reddened or scaly areas on the breasts or nipples.   You have an unusual discharge from your nipples.   You feel a new lump or unusually thick areas.  Document Released: 05/18/2005 Document Revised: 05/04/2012 Document Reviewed: 09/02/2011 [facility] Patient Information 2015 [place], [place]. This information is not intended to replace advice given to you by your health care provider. Make sure you discuss any questions you have with your health care provider.

## 2014-02-13 NOTE — [facility] Oncology Navigator Note (Signed)
Patient at [facility] for f/u visit with Charlestine Massed, NP.  She is accompanied by her husband.  Patient reports that she is doing fairly well.  We discussed her recent visit with Dr. Dalbert Batman to evaluate her abdominal pain.  She reported that he is going to order another CT scan in 6 months to re-evaluate the fluid collection in her abdomen.  She reports that she continues to have occasional pain.  She denied any problems related to her breast cancer.  She denied any questions at this time and verified that she has my contact information.  I encouraged her to call me for any needs or concerns she may have.

## 2014-02-13 NOTE — Progress Notes (Addendum)
. .  OFFICE PROGRESS NOTE  CC  Renato Shin, MD [address] [place] [address] Dr. Fanny Skates  DIAGNOSIS: 68 year old female with new diagnosis of stage I ER-/PR-/Her2Neu positive invasive ductal breast cancer  PRIOR THERAPY: 1. 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 ER negative PR negative Her2 Neu positive with Ki -67 53%,   2. S/P porta cath placement for chemotherapy  #3 patient has begun her adjuvant chemotherapy consisting of Taxotere carboplatinum and Herceptin. Her treatment began in May 2013. A total of 4 cycles of Taxotere carboplatinum and Herceptin combination are planned. Once she completes this she will then proceed to radiation therapy with concomitant Herceptin to be given every 3 weeks to finish out a year of treatment.  #4 patient received 4 cycles of Taxotere carboplatinum and Herceptin between 10/30/2011 to 11/2011.  #5 Radiation therapy from 02/08/12 to 03/25/12.  #6 she then began Herceptin every 3 weeks starting on 01/20/2012.  This finished on May 22.2014.    CURRENT THERAPY: Observation  INTERVAL HISTORY: [name] 68 y.o. female is here for f/u of her h/o HER-2 positive left breast cancer.  She is doing moderately well today.  She is having no problems from a breast cancer perspective.  She did recently go to the ER for nausea, vomiting, and diarrhea.  A CT abdomen pelvis noted a 6.8 x 3.8 x 2.9 cm sharply defined structure along the cecum and right lateral margin of distal cecum.  The area was determined not to be causing her acute symptoms causing the ER visit and she was sent to f/u with Dr. Dalbert Batman on 01/18/14.  He does not recommend abdominal surgery or exploration due to her abdominal adhesions, and said that the risk of needle aspiration of the fluid collection outweighed the benefits.  Simrit was referred to [name] for further  evaluation of chronic diarrhea and consideration of f/u colonoscopy.    Floriene denies any new pain, fevers, chills, headaches, vision changes, breast changes.  She does occasionally have LLQ pain that she says is longstanding with her diarrheal issues.     MEDICAL HISTORY: Past Medical History  Diagnosis Date  . Obesity   . PUD (peptic ulcer disease)   . Leukopenia   . Short bowel syndrome   . Internal hemorrhoids without mention of complication   . Stricture and stenosis of esophagus   . Osteoarthrosis, unspecified whether generalized or localized, unspecified site   . Thyrotoxicosis without mention of goiter or other cause, without mention of thyrotoxic crisis or storm   . Other and unspecified hyperlipidemia   . Gout, unspecified   . Diverticulosis of colon (without mention of hemorrhage)   . C. difficile colitis   . VITAMIN B12 DEFICIENCY 08/30/2009  . GOITER, MULTINODULAR 04/02/2009  . HYPOTHYROIDISM, POST-RADIATION 08/13/2009  . ASYMPTOMATIC POSTMENOPAUSAL STATUS 10/11/2008  . Esophageal reflux 06/12/2008  . PONV (postoperative nausea and vomiting)   . Varicose veins   . Blood transfusion   . UTI (urinary tract infection)   . Kidney stones     "several"  . Unspecified essential hypertension   . Angina   . Shortness of breath on exertion     "sometimes"  . ANEMIA, IRON DEFICIENCY 05/08/2009  . History of lower GI bleeding   . H/O hiatal hernia   . Migraines   . Breast cancer 09/29/11  invasive grade III ductal ca,assoc high grade dcis,ER/PR=neg  . History of radiation therapy 02/08/12-03/25/12    left breast,total 61gy  . Blood transfusion without reported diagnosis   . Hypomagnesemia   . Hypokalemia 05/11/2013  . Gastroparesis   . Type II or unspecified type diabetes mellitus without mention of complication, not stated as uncontrolled     no med in years diet controled    ALLERGIES:  is allergic to aspirin; codeine; flagyl; iodine; and morphine and  related.  MEDICATIONS:  Current Outpatient Prescriptions  Medication Sig Dispense Refill  . Calcium Carbonate-Vitamin D (CALCIUM-VITAMIN D) 600-200 MG-UNIT CAPS Take 1 capsule by mouth daily.        . cyanocobalamin (,VITAMIN B-12,) 1000 MCG/ML injection Inject 1,000 mcg into the muscle every 30 (thirty) days. Next one due the 26th of this month      . ergocalciferol (VITAMIN D2) 50000 UNITS capsule Take 1 capsule (50,000 Units total) by mouth once a week.  4 capsule  3  . ferrous sulfate 325 (65 FE) MG tablet Take 325 mg by mouth daily with breakfast.      . fluorometholone (FML) 0.1 % ophthalmic suspension Place 1 drop into both eyes 2 (two) times daily.      Marland Kitchen HYDROcodone-acetaminophen (NORCO) 10-325 MG per tablet Take 1 tablet by mouth every 6 (six) hours as needed for moderate pain.  50 tablet  0  . levothyroxine (SYNTHROID, LEVOTHROID) 150 MCG tablet Take 150 mcg by mouth daily before breakfast.      . loperamide (IMODIUM) 2 MG capsule Take 2 capsules (4 mg total) by mouth every 4 (four) hours as needed for diarrhea or loose stools.  30 capsule  0  . lovastatin (MEVACOR) 20 MG tablet TAKE 1 TABLET (20 MG TOTAL) BY MOUTH DAILY AT 6 PM.  30 tablet  2  . magnesium oxide (MAG-OX) 400 MG tablet Take 400 mg by mouth 3 (three) times daily.      . ondansetron (ZOFRAN) 4 MG tablet Take 1 tablet (4 mg total) by mouth every 8 (eight) hours as needed for nausea or vomiting.  20 tablet  0  . pantoprazole (PROTONIX) 40 MG tablet Take 1 tablet 30 minutes before breakfast and supper  60 tablet  11  . ranitidine (ZANTAC) 300 MG capsule Take 300 mg by mouth every evening.      . thiamine 100 MG tablet Take 100 mg by mouth daily.        Marland Kitchen UNABLE TO FIND Take 1 tablet by mouth every morning. Slow Mag- Magnesium Chloride      . zolpidem (AMBIEN) 5 MG tablet Take 1 tablet (5 mg total) by mouth at bedtime as needed for sleep.  30 tablet  5  . calcium carbonate (TUMS - DOSED IN MG ELEMENTAL CALCIUM) 500 MG  chewable tablet Chew 1 tablet by mouth as needed for indigestion or heartburn.      . calcium gluconate 650 MG tablet Take 1 tablet (650 mg total) by mouth daily.  30 tablet  2  . potassium chloride SA (K-DUR,KLOR-CON) 20 MEQ tablet Take 1 tablet (20 mEq total) by mouth daily.  30 tablet  1  . spironolactone (ALDACTONE) 25 MG tablet Take 25 mg by mouth daily.       No current facility-administered medications for this visit.   Facility-Administered Medications Ordered in Other Visits  Medication Dose Route Frequency Provider Last Rate Last Dose  . acetaminophen (TYLENOL) tablet 1,000 mg  1,000 mg  Oral Once Amada Kingfisher, MD        SURGICAL HISTORY:  Past Surgical History  Procedure Laterality Date  . Vein ligation and stripping  1980's    Right leg  . Lithotripsy      "4 or 5 times"  . Bunionectomy  1970's    bilateral  . Colonoscopy  2012    multiple   . Esophagogastroduodenoscopy  2011    multiple   . Thyroid ultrasound  12/1994 and 12/1995  . Mastectomy w/ nodes partial  09/29/11    left  . Port a cath placement  09/29/11    right chest  . Breast surgery    . Cholecystectomy  1990's  . Abdominal hysterectomy  1970's    with BSO  . Dilation and curettage of uterus    . Colon surgery      "several surgeries for short bowel syndrome"  . Abdominal adhesion surgery  1980's thru 1990's    "several"  . Kidney stone surgery  1990's    "tried to go up & get it but pushed it further up"  . Portacath placement  09/29/2011    Procedure: INSERTION PORT-A-CATH;  Surgeon: Adin Hector, MD;  Location: [facility];  Service: General;  Laterality: N/A;  . Breast biopsy  08/13/11    left breast lower inner quadrant  . Breast lumpectomy w/ needle localization  09/29/11    left  breast=lymph node,excision benign/ ER/PR=neg, her 2 Positive  . Flexible sigmoidoscopy  2011    multiple   . Port-a-cath removal Right 12/19/2013    Procedure: MINOR REMOVAL PORT-A-CATH;  Surgeon: Adin Hector, MD;   Location: [facility];  Service: General;  Laterality: Right;    REVIEW OF SYSTEMS:   A 10 point review of systems was conducted and is otherwise negative except for what is noted above.    Health Maintenance  Mammogram:  10/03/2013 Colonoscopy: 2012 Bone Density Scan: missed recent appt, have re-ordered Pap Smear: TAH/BSO in [place] Exam: 2013 Vitamin D Level:  08/09/2013 Lipid Panel: 05/2013    PHYSICAL EXAMINATION:  BP 113/72  Pulse 63  Temp(Src) 97.8 F (36.6 C) (Oral)  Resp 20  Ht 5' 5"  (1.651 m)  Wt 191 lb (86.637 kg)  BMI 31.78 kg/m2 GENERAL: Patient is a well appearing female in no acute distress HEENT:  Sclerae anicteric.  Oropharynx clear and moist. No ulcerations or evidence of oropharyngeal candidiasis. Neck is supple.  NODES:  No cervical, supraclavicular, or axillary lymphadenopathy palpated.  BREAST EXAM:   left breast healing incision scar, with some tenderness, and radiation skin changes. Right breast no masses or nipple discharge. LUNGS:  Clear to auscultation bilaterally.  No wheezes or rhonchi. HEART:  Regular rate and rhythm. No murmur appreciated. ABDOMEN:  Soft, nontender.  Positive, normoactive bowel sounds. No organomegaly palpated. MSK:  No focal spinal tenderness to palpation. Full range of motion bilaterally in the upper extremities. EXTREMITIES:  No peripheral edema.   SKIN:  Clear with no obvious rashes or skin changes. No nail dyscrasia. NEURO:  Nonfocal. Well oriented.  Appropriate affect. ECOG PERFORMANCE STATUS: 1 - Symptomatic but completely ambulatory  LABORATORY DATA: Lab Results  Component Value Date   WBC 3.7* 02/13/2014   HGB 10.2* 02/13/2014   HCT 31.8* 02/13/2014   MCV 91.0 02/13/2014   PLT 206 02/13/2014      Chemistry      Component Value Date/Time   NA 143 02/13/2014 0902  NA 140 01/16/2014 1618   K 3.4* 02/13/2014 0902   K 3.3* 01/16/2014 1618   CL 104 01/16/2014 1618   CL 107 10/20/2012 1102   CO2 25 02/13/2014  0902   CO2 28 01/16/2014 1618   BUN 9.9 02/13/2014 0902   BUN 10 01/16/2014 1618   CREATININE 1.0 02/13/2014 0902   CREATININE 1.0 01/16/2014 1618   CREATININE 0.88 03/31/2011 1550      Component Value Date/Time   CALCIUM 9.1 02/13/2014 0902   CALCIUM 9.4 01/16/2014 1618   CALCIUM 9.5 05/15/2010 2153   ALKPHOS 69 02/13/2014 0902   ALKPHOS 73 01/08/2014 1150   AST 20 02/13/2014 0902   AST 21 01/08/2014 1150   ALT 17 02/13/2014 0902   ALT 14 01/08/2014 1150   BILITOT 0.78 02/13/2014 0902   BILITOT 0.9 01/08/2014 1150     ADDITIONAL INFORMATION: 1. PROGNOSTIC INDICATORS - ACIS Results IMMUNOHISTOCHEMICAL AND MORPHOMETRIC ANALYSIS BY THE AUTOMATED CELLULAR IMAGING SYSTEM (ACIS) Estrogen Receptor (Negative, <1%): 0%, NEGATIVE Progesterone Receptor (Negative, <1%): 0%, NEGATIVE COMMENT: The negative hormone receptor study(ies) in this case have an internal positive control. All controls stained appropriately Aldona Bar MD Pathologist, Electronic Signature ( Signed 10/07/2011) FINAL DIAGNOSIS 1 of 4 FINAL for [place], [place] ([id number]) Diagnosis 1. Breast, lumpectomy, Left - INVASIVE GRADE III, DUCTAL CARCINOMA, SPANNING 0.7 CM. - ASSOCIATED HIGH GRADED DUCTAL CARCINOMA IN SITU. - LYMPH/VASCULAR INVASION NOT IDENTIFIED. - MARGINS ARE NEGATIVE. - SEE ONCOLOGY TEMPLATE. 2. Lymph node, sentinel, biopsy, Left axillary#1 - ONE BENIGN LYMPH NODE WITH NO TUMOR (0/1). - BENIGN GLANDULAR EPITHELIAL INCLUSIONS PRESENT. - SEE COMMENT. 3. Breast, excision, Posterior margin - BENIGN BREAST PARENCHYMA. - NO ATYPIA, HYPERPLASIA, OR MALIGNANCY IDENTIFIED. 4. Lymph node, sentinel, biopsy, Left axillary #2 - ONE BENIGN LYMPH NODE WITH NO TUMOR SEEN (0/1). Microscopic Comment 1. BREAST, INVASIVE TUMOR, WITH LYMPH NODE SAMPLING Specimen, including laterality: Left breast with posterior margin and sentinel lymph nodes. Procedure: Left breast lumpectomy with posterior margin excision and  sentinel lymph node biopsies. Grade: III. Tubule formation: 3. Nuclear pleomorphism: 3. Mitotic:3. Tumor size (gross measurement and glass slide measurement): 0.7 cm. Margins: Invasive, distance to closest margin: At least 0.8 cm. In-situ, distance to closest margin: At least 0.8 cm. Lymphovascular invasion: Not identified. Ductal carcinoma in situ: Yes. Grade: High grade. Extensive intraductal component: No. Lobular neoplasia: No. Tumor focality: Unifocal. Treatment effect: N/A. Extent of tumor: Confined to breast parenchyma. Lymph nodes: # examined: 2. Lymph nodes with metastasis: 0. Breast prognostic profile: Performed on previous case ([id number]) Estrogen receptor: 0%, negative. Progesterone receptor: 0%, negative. Her 2 neu: 3.09, amplified. Ki-67: 53%. Non-neoplastic breast: Fat necrosis present. TNM: pT1b, pN0, MX. Comments: An estrogen receptor and progesterone receptor will be repeated on the current tumor and reported in an addendum. (RAH:gt, 10/01/11) 2. The left sentinel axillary lymph node #1 shows benign glandular epithelial inclusions. Some of the inclusions are ciliated. The nuclei are bland appearing and are not malignant. Smooth muscle myosin, p63 and calponin immunohistochemical stains are performed which do not show a myoepithelial layer. 2 of 4 FINAL for [place], CANDIACE WEST ([id number]) Microscopic Comment(continued) Although this is the case, the inclusions are benign and do not represent metastatic carcinoma. Both Dr. Donato Heinz and Dr. Lyndon Code have seen the left sentinel axillary lymph node in consultation with agreement that the inclusions are benign and do not represent metastatic carcinoma. Willeen Niece MD Pathologist, Electronic Signature (Case signed 10/02/2011) Specimen Gross and Clinical Information Specimen(s) Obtained: 1. Breast,  lumpectomy, Left 2. Lymph node, sentinel, biopsy, Left axillary#1 3. Breast, excision, Posterior margin 4. Lymph  node, sentinel, biopsy, Left axillary #2 Specimen Clinical  RADIOGRAPHIC STUDIES:  ASSESSMENT: 68 year old with   1. 0.7 cm high grade invasive ductal carcinoma that is ER-, PR- Her2Neu +, sentinel node negative (T1bN0) pathologic stage I. Because patient is HER-2 positive she received adjuvant chemotherapy and Herceptin. She underwent 4 cycles of TCH combination and then Herceptin alone to complete out 1 years worth of treatment.  2.  Patient completed radiation therapy from 02/08/12 through 03/25/12.  3.  Patient recently referred to [name] at [place].     PLAN:   Ayeshia is doing well from a breast cancer perspective.  She has no sign of recurrence.  Her health maintenance is up to date.  I did re-order her another bone density and recommended this be done. I recommended healthy diet, exercise, and monthly breast exams.   She does have a continued normocytic anemia and we added on a retic count, B12, folate, epo level, and iron studies.    Allana will continue to follow with surgery regarding her RLQ fluid collection.  A CT scan is due to be repeated in March, 2016.    Alianis will continue to follow with GI regarding her diarrhea issues.   We will see Petina back in 6 months for labs and an office visit.   All questions were answered. The patient knows to call the clinic with any problems, questions or concerns. We can certainly see the patient much sooner if necessary.  I spent 25 minutes counseling the patient face to face. The total time spent in the appointment was 30 minutes.  Minette Headland, NP Medical Oncology [facility] [phone]   Attending Note  I personally saw and examined [name]. The plan of care was discussed with her. I agree with the assessment and plan as documented above. Patient was concerned with the abdominal complex cystic lesion. She has appointment with surgery to follow up on it. I will leave it to  surgery to decide the appropriate management.  Signed Rulon Eisenmenger, MD

## 2014-02-14 ENCOUNTER — Ambulatory Visit (INDEPENDENT_AMBULATORY_CARE_PROVIDER_SITE_OTHER): Payer: Medicare Other | Admitting: Nurse Practitioner

## 2014-02-14 ENCOUNTER — Encounter: Payer: Self-pay | Admitting: Nurse Practitioner

## 2014-02-14 ENCOUNTER — Telehealth: Payer: Self-pay | Admitting: Hematology and Oncology

## 2014-02-14 VITALS — BP 108/70 | HR 68 | Ht 65.0 in | Wt 192.4 lb

## 2014-02-14 DIAGNOSIS — R197 Diarrhea, unspecified: Secondary | ICD-10-CM

## 2014-02-14 DIAGNOSIS — R109 Unspecified abdominal pain: Secondary | ICD-10-CM

## 2014-02-14 MED ORDER — DIPHENOXYLATE-ATROPINE 2.5-0.025 MG PO TABS: ORAL_TABLET | ORAL | Status: DC

## 2014-02-14 MED ORDER — OXYCODONE-ACETAMINOPHEN 7.5-325 MG PO TABS: ORAL_TABLET | ORAL | Status: DC

## 2014-02-14 NOTE — Telephone Encounter (Signed)
, °

## 2014-02-14 NOTE — Patient Instructions (Signed)
Stop the Imodium. We sent a prescription for a trial of lomotil tablets for diarrhea.  Take 1 tab 2-3 times daily as needed for diarrhea..  Stop the Hydrocodone. We have a prescription for Percocet. Take 1 tab every 4-6 hours as needed for pain.

## 2014-02-15 ENCOUNTER — Encounter: Payer: Self-pay | Admitting: Nurse Practitioner

## 2014-02-15 LAB — FOLATE RBC: RBC Folate: 850 ng/mL (ref >280)

## 2014-02-15 LAB — VITAMIN B12: Vitamin B-12: 603 pg/mL (ref 211–911)

## 2014-02-15 LAB — ERYTHROPOIETIN: ERYTHROPOIETIN: 16.2 m[IU]/mL (ref 2.6–18.5)

## 2014-02-15 LAB — FOLATE: FOLATE: 12 ng/mL

## 2014-02-15 NOTE — Progress Notes (Signed)
Reviewed and agree with management plan.  Kien Mirsky T. Tanyon Alipio, MD FACG 

## 2014-02-15 NOTE — Progress Notes (Signed)
     History of Present Illness:   Patient is a 68 year old female well known to [place] (Dr. Sharlett Iles) for history of GERD / peptic strictures, remote c-diff, and chronic abdominal pain and diarrhea. Patient is s/p multiple abdominal surgeries with resulting short bowel syndrome. She has had nearly a total colectomy for adhesions. Dr. Sharlett Iles had been managing her pain with hydrocodone. He has tried to treat her for small intestine bacterial overgrowth but she could not afford Xifaxan and had a reaction to flagyl.  Patient comes in today for evaluation of worsening diarrhea. Her normal bowel habits consist of approximately 3 loose stools daily but over the last month stool frequency has doubled and she is having significant lower abdominal pain as well.  No recent medication changes. No recent antibiotics. Patient was seen in the ED 01/08/14.  CT scan showed a large and complex lesion in the right lower quadrant. GI pathogen panel was negative. WBC 3.4. Patient was subsequently seen by Dr. Dalbert Batman at [facility].  Dr. Dalbert Batman felt the fluid collection, though larger compared to last year, was a benign process and that her risk of surgery or FNA outweighed the benefits. Plan is to repeat CT scan in 6 months and proceed with FNA if lesion  Continues to enlarge.  He recommended she follow up with [place] for chronic diarrhea.   Current Medications, Allergies, Past Medical History, Past Surgical History, Family History and Social History were reviewed in [employer] record.  Physical Exam: General: Pleasant, well developed , black female in no acute distress Head: Normocephalic and atraumatic Eyes:  sclerae anicteric, conjunctiva pink  Ears: Normal auditory acuity Lungs: Clear throughout to auscultation Heart: Regular rate and rhythm Abdomen: Soft, non distended, non-tender. No masses, no hepatomegaly. Normal bowel sounds Musculoskeletal: Symmetrical with no gross  deformities  Extremities: No edema  Neurological: Alert oriented x 4, grossly nonfocal Psychological:  Alert and cooperative. Normal mood and affect  Assessment and Recommendations:  95. 68 year old female with multiple medical problems. She is status post multiple abdominal surgeries including a near total colectomy for adhesions. Patient has chronic abdominal pain and chronic loose stools. Dr. Sharlett Iles has managed her symptoms with hydrocodone and anti-diarrheals.   2. Acute on chronic diarrhea. Stool pathogen panel negative. 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   I will see if my nurse can help the patient get xifaxan (?patient assistance program).  Trial of Lomotil. Patient is still taking Imodium though PCP had mentioned changing her to Lomotil.    I don't know that colonoscopy would add much as she had a complete colonoscopy May 2012 (for diarrhea) and it was unrevealing.   Will send copy of note to PCP and ask him about magnesium supplements. At some point in time patient went from one tablet daily to TID. This could certainly be contributing to her diarrhea.   3. Acute on chronic abdominal pain. He has been taking hydrocodone as prescribed by Dr. Sharlett Iles but it isn't much help lately.  I do not know if pain is related to pelvic fluid collection seen on CTscan but there were not other findings to explain her pain. I changed her to Percocet one tablet every 6 hours but should pain persist I think she needs referral to Pain Management

## 2014-02-16 ENCOUNTER — Ambulatory Visit (INDEPENDENT_AMBULATORY_CARE_PROVIDER_SITE_OTHER): Payer: Medicare Other

## 2014-02-16 DIAGNOSIS — E538 Deficiency of other specified B group vitamins: Secondary | ICD-10-CM

## 2014-02-16 MED ORDER — CYANOCOBALAMIN 1000 MCG/ML IJ SOLN
1000.0000 ug | Freq: Once | INTRAMUSCULAR | Status: AC
  Administered 2014-02-16: 1000 ug via INTRAMUSCULAR

## 2014-02-26 ENCOUNTER — Ambulatory Visit
Admission: RE | Admit: 2014-02-26 | Discharge: 2014-02-26 | Disposition: A | Discharge status: Home / Self Care | Payer: Medicare Other | Source: Ambulatory Visit | Attending: Adult Health | Admitting: Adult Health

## 2014-02-26 DIAGNOSIS — E2839 Other primary ovarian failure: Secondary | ICD-10-CM

## 2014-02-27 ENCOUNTER — Other Ambulatory Visit: Payer: Self-pay | Admitting: Endocrinology

## 2014-03-08 ENCOUNTER — Ambulatory Visit (INDEPENDENT_AMBULATORY_CARE_PROVIDER_SITE_OTHER): Payer: Medicare Other | Admitting: Endocrinology

## 2014-03-08 VITALS — BP 122/74 | HR 74 | Temp 98.0°F | Ht 65.0 in | Wt 190.0 lb

## 2014-03-08 DIAGNOSIS — E538 Deficiency of other specified B group vitamins: Secondary | ICD-10-CM

## 2014-03-08 MED ORDER — OXYCODONE-ACETAMINOPHEN 7.5-325 MG PO TABS: ORAL_TABLET | ORAL | Status: DC

## 2014-03-08 MED ORDER — CYANOCOBALAMIN 1000 MCG/ML IJ SOLN
1000.0000 ug | Freq: Once | INTRAMUSCULAR | Status: AC
  Administered 2014-03-08: 1000 ug via INTRAMUSCULAR

## 2014-03-08 MED ORDER — DESIPRAMINE HCL 10 MG PO TABS: 10.0000 mg | ORAL_TABLET | Freq: Every day | ORAL | Status: DC

## 2014-03-08 NOTE — Patient Instructions (Addendum)
i have sent a prescription to your pharmacy, for your symptoms. i hope you feel better soon.

## 2014-03-08 NOTE — Progress Notes (Signed)
Subjective:    Patient ID: [name], female    DOB: Nov 16, 1945, 68 y.o.   MRN: [id number]  HPI Pt states few mos of severe alternating "feeling hot and cold" throughout the body, worse in the context of sleep.   diarrhea is less now.   Past Medical History  Diagnosis Date  . Obesity   . PUD (peptic ulcer disease)   . Leukopenia   . Short bowel syndrome   . Internal hemorrhoids without mention of complication   . Stricture and stenosis of esophagus   . Osteoarthrosis, unspecified whether generalized or localized, unspecified site   . Thyrotoxicosis without mention of goiter or other cause, without mention of thyrotoxic crisis or storm   . Other and unspecified hyperlipidemia   . Gout, unspecified   . Diverticulosis of colon (without mention of hemorrhage)   . C. difficile colitis   . VITAMIN B12 DEFICIENCY 08/30/2009  . GOITER, MULTINODULAR 04/02/2009  . HYPOTHYROIDISM, POST-RADIATION 08/13/2009  . ASYMPTOMATIC POSTMENOPAUSAL STATUS 10/11/2008  . Esophageal reflux 06/12/2008  . PONV (postoperative nausea and vomiting)   . Varicose veins   . Blood transfusion   . UTI (urinary tract infection)   . Kidney stones     "several"  . Unspecified essential hypertension   . Angina   . Shortness of breath on exertion     "sometimes"  . ANEMIA, IRON DEFICIENCY 05/08/2009  . History of lower GI bleeding   . H/O hiatal hernia   . Migraines   . Breast cancer 09/29/11    invasive grade III ductal ca,assoc high grade dcis,ER/PR=neg  . History of radiation therapy 02/08/12-03/25/12    left breast,total 61gy  . Blood transfusion without reported diagnosis   . Hypomagnesemia   . Hypokalemia 05/11/2013  . Gastroparesis   . Type II or unspecified type diabetes mellitus without mention of complication, not stated as uncontrolled     no med in years diet controled    Past Surgical History  Procedure Laterality Date  . Vein ligation and stripping  1980's    Right leg  . Lithotripsy       "4 or 5 times"  . Bunionectomy  1970's    bilateral  . Colonoscopy  2012    multiple   . Esophagogastroduodenoscopy  2011    multiple   . Thyroid ultrasound  12/1994 and 12/1995  . Mastectomy w/ nodes partial  09/29/11    left  . Cholecystectomy  1990's  . Abdominal hysterectomy  1970's    with BSO  . Dilation and curettage of uterus    . Colon surgery      "several surgeries for short bowel syndrome"  . Abdominal adhesion surgery  1980's thru 1990's    "several"  . Kidney stone surgery  1990's    "tried to go up & get it but pushed it further up"  . Portacath placement  09/29/2011    Procedure: INSERTION PORT-A-CATH;  Surgeon: Adin Hector, MD;  Location: [place];  Service: General;  Laterality: N/A;  . Breast biopsy  08/13/11    left breast lower inner quadrant  . Breast lumpectomy w/ needle localization  09/29/11    left  breast=lymph node,excision benign/ ER/PR=neg, her 2 Positive  . Flexible sigmoidoscopy  2011    multiple   . Port-a-cath removal Right 12/19/2013    Procedure: MINOR REMOVAL PORT-A-CATH;  Surgeon: Adin Hector, MD;  Location: [place];  Service: General;  Laterality:  Right;    History   Social History  . Marital Status: Married    Spouse Name: N/A    Number of Children: 2  . Years of Education: N/A   Occupational History  . Not on file.   Social History Main Topics  . Smoking status: Former Smoker -- 1.00 packs/day for 10 years    Types: Cigarettes    Quit date: 09/22/1985  . Smokeless tobacco: Never Used  . Alcohol Use: No     Comment: 09/29/11 "used to drink socially years ago"  . Drug Use: No  . Sexual Activity: No     Comment: HRT x many yrs   Other Topics Concern  . Not on file   Social History Narrative   Pt gets regular exercise    Current Outpatient Prescriptions on File Prior to Visit  Medication Sig Dispense Refill  . calcium carbonate (TUMS - DOSED IN MG ELEMENTAL CALCIUM) 500 MG chewable tablet Chew 1  tablet by mouth as needed for indigestion or heartburn.      . Calcium Carbonate-Vitamin D (CALCIUM-VITAMIN D) 600-200 MG-UNIT CAPS Take 1 capsule by mouth daily.        . cyanocobalamin (,VITAMIN B-12,) 1000 MCG/ML injection Inject 1,000 mcg into the muscle every 30 (thirty) days. Next one due the 26th of this month      . diphenoxylate-atropine (LOMOTIL) 2.5-0.025 MG per tablet Take 2-3 times daily as needed for diarrhea.  30 tablet  0  . ergocalciferol (VITAMIN D2) 50000 UNITS capsule Take 1 capsule (50,000 Units total) by mouth once a week.  4 capsule  3  . ferrous sulfate 325 (65 FE) MG tablet Take 325 mg by mouth daily with breakfast.      . fluorometholone (FML) 0.1 % ophthalmic suspension Place 1 drop into both eyes 2 (two) times daily.      Marland Kitchen levothyroxine (SYNTHROID, LEVOTHROID) 150 MCG tablet Take 150 mcg by mouth daily before breakfast.      . loperamide (IMODIUM) 2 MG capsule Take 2 capsules (4 mg total) by mouth every 4 (four) hours as needed for diarrhea or loose stools.  30 capsule  0  . lovastatin (MEVACOR) 20 MG tablet TAKE 1 TABLET (20 MG TOTAL) BY MOUTH DAILY AT 6 PM.  30 tablet  2  . magnesium oxide (MAG-OX) 400 MG tablet Take 400 mg by mouth 3 (three) times daily.      . ondansetron (ZOFRAN) 4 MG tablet Take 1 tablet (4 mg total) by mouth every 8 (eight) hours as needed for nausea or vomiting.  20 tablet  0  . pantoprazole (PROTONIX) 40 MG tablet Take 1 tablet 30 minutes before breakfast and supper  60 tablet  11  . potassium chloride SA (K-DUR,KLOR-CON) 20 MEQ tablet Take 1 tablet (20 mEq total) by mouth daily.  30 tablet  1  . ranitidine (ZANTAC) 300 MG capsule Take 300 mg by mouth every evening.      Marland Kitchen spironolactone (ALDACTONE) 25 MG tablet Take 25 mg by mouth daily.      Marland Kitchen thiamine 100 MG tablet Take 100 mg by mouth daily.        Marland Kitchen UNABLE TO FIND Take 1 tablet by mouth every morning. Slow Mag- Magnesium Chloride      . zolpidem (AMBIEN) 5 MG tablet Take 1 tablet (5 mg  total) by mouth at bedtime as needed for sleep.  30 tablet  5  . calcium gluconate 650 MG tablet Take 1  tablet (650 mg total) by mouth daily.  30 tablet  2   Current Facility-Administered Medications on File Prior to Visit  Medication Dose Route Frequency Provider Last Rate Last Dose  . acetaminophen (TYLENOL) tablet 1,000 mg  1,000 mg Oral Once Amada Kingfisher, MD        Allergies  Allergen Reactions  . Aspirin Other (See Comments)    REACTION: Gi Intolerance/ Burning in stomach  . Codeine Itching  . Trazodone And Nefazodone     "sick"  . Flagyl [Metronidazole] Rash  . Iodine Itching    Allergic to IVP dye  . Morphine And Related Itching    Family History  Problem Relation Age of Onset  . Esophageal cancer Son     deceased  . Diabetes Mother   . Heart disease Mother   . Colon cancer Paternal Uncle   . Kidney disease Sister   . Diabetes Father   . Hypertension Father   . Kidney disease Brother     x 3    BP 122/74  Pulse 74  Temp(Src) 98 F (36.7 C) (Oral)  Ht 5\' 5"  (1.651 m)  Wt 190 lb (86.183 kg)  BMI 31.62 kg/m2  SpO2 99%  Review of Systems She also has excessive diaphoresis at night.  No fever.      Objective:   Physical Exam VITAL SIGNS:  See vs page.   GENERAL: no distress. Skin: not diaphoretic.   Neuro: no tremor.    Lab Results  Component Value Date   TSH 0.83 08/28/2013   T4TOTAL 8.1 07/29/2007   i have reviewed the following old records: Office notes    Assessment & Plan:  Heat and cold intolerance, new, uncertain etiology Diarrhea, uncertain etiology, improved.   Patient is advised the following: Patient Instructions  i have sent a prescription to your pharmacy, for your symptoms. i hope you feel better soon.

## 2014-03-10 ENCOUNTER — Telehealth: Payer: Self-pay

## 2014-03-10 NOTE — Telephone Encounter (Signed)
Bone density report received from [facility] 02/26/14.  Reviewed by Dr Lavonda Jumbo to scan.

## 2014-03-16 ENCOUNTER — Ambulatory Visit: Payer: Medicare Other | Admitting: Endocrinology

## 2014-03-19 ENCOUNTER — Ambulatory Visit: Payer: Medicare Other

## 2014-03-27 ENCOUNTER — Other Ambulatory Visit: Payer: Self-pay | Admitting: Endocrinology

## 2014-03-27 NOTE — Telephone Encounter (Signed)
Please advise if ok to refill. Rx is listed under historical provider.  Thanks!  

## 2014-04-03 ENCOUNTER — Ambulatory Visit: Payer: Medicare Other | Admitting: Endocrinology

## 2014-04-09 ENCOUNTER — Ambulatory Visit: Payer: Medicare Other

## 2014-04-13 ENCOUNTER — Other Ambulatory Visit: Payer: Self-pay | Admitting: Endocrinology

## 2014-04-20 ENCOUNTER — Other Ambulatory Visit: Payer: Self-pay | Admitting: Endocrinology

## 2014-04-20 ENCOUNTER — Ambulatory Visit (INDEPENDENT_AMBULATORY_CARE_PROVIDER_SITE_OTHER): Payer: Medicare Other | Admitting: Endocrinology

## 2014-04-20 ENCOUNTER — Encounter: Payer: Self-pay | Admitting: Endocrinology

## 2014-04-20 VITALS — BP 128/88 | HR 81 | Temp 97.9°F | Ht 65.0 in | Wt 194.0 lb

## 2014-04-20 DIAGNOSIS — D509 Iron deficiency anemia, unspecified: Secondary | ICD-10-CM | POA: Diagnosis not present

## 2014-04-20 DIAGNOSIS — M109 Gout, unspecified: Secondary | ICD-10-CM | POA: Diagnosis not present

## 2014-04-20 DIAGNOSIS — E119 Type 2 diabetes mellitus without complications: Secondary | ICD-10-CM

## 2014-04-20 DIAGNOSIS — E649 Sequelae of unspecified nutritional deficiency: Secondary | ICD-10-CM

## 2014-04-20 DIAGNOSIS — E559 Vitamin D deficiency, unspecified: Secondary | ICD-10-CM | POA: Diagnosis not present

## 2014-04-20 DIAGNOSIS — E876 Hypokalemia: Secondary | ICD-10-CM | POA: Diagnosis not present

## 2014-04-20 DIAGNOSIS — E890 Postprocedural hypothyroidism: Secondary | ICD-10-CM | POA: Diagnosis not present

## 2014-04-20 DIAGNOSIS — K649 Unspecified hemorrhoids: Secondary | ICD-10-CM | POA: Insufficient documentation

## 2014-04-20 DIAGNOSIS — E538 Deficiency of other specified B group vitamins: Secondary | ICD-10-CM

## 2014-04-20 MED ORDER — OXYCODONE-ACETAMINOPHEN 7.5-325 MG PO TABS: ORAL_TABLET | ORAL | Status: DC

## 2014-04-20 MED ORDER — CYANOCOBALAMIN 1000 MCG/ML IJ SOLN
1000.0000 ug | Freq: Once | INTRAMUSCULAR | Status: AC
  Administered 2014-04-20: 1000 ug via INTRAMUSCULAR

## 2014-04-20 NOTE — Patient Instructions (Addendum)
blood tests are being requested for you today.  We'll let you know about the results.  Please come back for a follow-up appointment in 3 months.  Please see a specialist.  you will receive a phone call, about a day and time for an appointment.     Hemorrhoids Hemorrhoids are swollen veins around the rectum or anus. There are two types of hemorrhoids:   Internal hemorrhoids. These occur in the veins just inside the rectum. They may poke through to the outside and become irritated and painful.  External hemorrhoids. These occur in the veins outside the anus and can be felt as a painful swelling or hard lump near the anus. CAUSES  Pregnancy.   Obesity.   Constipation or diarrhea.   Straining to have a bowel movement.   Sitting for long periods on the toilet.  Heavy lifting or other activity that caused you to strain.  Anal intercourse. SYMPTOMS   Pain.   Anal itching or irritation.   Rectal bleeding.   Fecal leakage.   Anal swelling.   One or more lumps around the anus.  DIAGNOSIS  Your caregiver may be able to diagnose hemorrhoids by visual examination. Other examinations or tests that may be performed include:   Examination of the rectal area with a gloved hand (digital rectal exam).   Examination of anal canal using a small tube (scope).   A blood test if you have lost a significant amount of blood.  A test to look inside the colon (sigmoidoscopy or colonoscopy). TREATMENT Most hemorrhoids can be treated at home. However, if symptoms do not seem to be getting better or if you have a lot of rectal bleeding, your caregiver may perform a procedure to help make the hemorrhoids get smaller or remove them completely. Possible treatments include:   Placing a rubber band at the base of the hemorrhoid to cut off the circulation (rubber band ligation).   Injecting a chemical to shrink the hemorrhoid (sclerotherapy).   Using a tool to burn the hemorrhoid  (infrared light therapy).   Surgically removing the hemorrhoid (hemorrhoidectomy).   Stapling the hemorrhoid to block blood flow to the tissue (hemorrhoid stapling).  HOME CARE INSTRUCTIONS   Eat foods with fiber, such as whole grains, beans, nuts, fruits, and vegetables. Ask your doctor about taking products with added fiber in them (fibersupplements).  Increase fluid intake. Drink enough water and fluids to keep your urine clear or pale yellow.   Exercise regularly.   Go to the bathroom when you have the urge to have a bowel movement. Do not wait.   Avoid straining to have bowel movements.   Keep the anal area dry and clean. Use wet toilet paper or moist towelettes after a bowel movement.   Medicated creams and suppositories may be used or applied as directed.   Only take over-the-counter or prescription medicines as directed by your caregiver.   Take warm sitz baths for 15-20 minutes, 3-4 times a day to ease pain and discomfort.   Place ice packs on the hemorrhoids if they are tender and swollen. Using ice packs between sitz baths may be helpful.   Put ice in a plastic bag.   Place a towel between your skin and the bag.   Leave the ice on for 15-20 minutes, 3-4 times a day.   Do not use a donut-shaped pillow or sit on the toilet for long periods. This increases blood pooling and pain.  SEEK MEDICAL CARE IF:  You have increasing pain and swelling that is not controlled by treatment or medicine.  You have uncontrolled bleeding.  You have difficulty or you are unable to have a bowel movement.  You have pain or inflammation outside the area of the hemorrhoids. MAKE SURE YOU:  Understand these instructions.  Will watch your condition.  Will get help right away if you are not doing well or get worse. Document Released: 05/15/2000 Document Revised: 05/04/2012 Document Reviewed: 03/22/2012 [facility] Patient Information 2015 [place], [place]. This  information is not intended to replace advice given to you by your health care provider. Make sure you discuss any questions you have with your health care provider.

## 2014-04-20 NOTE — Progress Notes (Signed)
Subjective:    Patient ID: [name], female    DOB: 02/25/1946, 68 y.o.   MRN: [id number]  HPI Pt states 1 week of slight bleeding from the rectum, and assoc pain.   Past Medical History  Diagnosis Date  . Obesity   . PUD (peptic ulcer disease)   . Leukopenia   . Short bowel syndrome   . Internal hemorrhoids without mention of complication   . Stricture and stenosis of esophagus   . Osteoarthrosis, unspecified whether generalized or localized, unspecified site   . Thyrotoxicosis without mention of goiter or other cause, without mention of thyrotoxic crisis or storm   . Other and unspecified hyperlipidemia   . Gout, unspecified   . Diverticulosis of colon (without mention of hemorrhage)   . C. difficile colitis   . VITAMIN B12 DEFICIENCY 08/30/2009  . GOITER, MULTINODULAR 04/02/2009  . HYPOTHYROIDISM, POST-RADIATION 08/13/2009  . ASYMPTOMATIC POSTMENOPAUSAL STATUS 10/11/2008  . Esophageal reflux 06/12/2008  . PONV (postoperative nausea and vomiting)   . Varicose veins   . Blood transfusion   . UTI (urinary tract infection)   . Kidney stones     "several"  . Unspecified essential hypertension   . Angina   . Shortness of breath on exertion     "sometimes"  . ANEMIA, IRON DEFICIENCY 05/08/2009  . History of lower GI bleeding   . H/O hiatal hernia   . Migraines   . Breast cancer 09/29/11    invasive grade III ductal ca,assoc high grade dcis,ER/PR=neg  . History of radiation therapy 02/08/12-03/25/12    left breast,total 61gy  . Blood transfusion without reported diagnosis   . Hypomagnesemia   . Hypokalemia 05/11/2013  . Gastroparesis   . Type II or unspecified type diabetes mellitus without mention of complication, not stated as uncontrolled     no med in years diet controled    Past Surgical History  Procedure Laterality Date  . Vein ligation and stripping  1980's    Right leg  . Lithotripsy      "4 or 5 times"  . Bunionectomy  1970's    bilateral  .  Colonoscopy  2012    multiple   . Esophagogastroduodenoscopy  2011    multiple   . Thyroid ultrasound  12/1994 and 12/1995  . Mastectomy w/ nodes partial  09/29/11    left  . Cholecystectomy  1990's  . Abdominal hysterectomy  1970's    with BSO  . Dilation and curettage of uterus    . Colon surgery      "several surgeries for short bowel syndrome"  . Abdominal adhesion surgery  1980's thru 1990's    "several"  . Kidney stone surgery  1990's    "tried to go up & get it but pushed it further up"  . Portacath placement  09/29/2011    Procedure: INSERTION PORT-A-CATH;  Surgeon: Adin Hector, MD;  Location: [place];  Service: General;  Laterality: N/A;  . Breast biopsy  08/13/11    left breast lower inner quadrant  . Breast lumpectomy w/ needle localization  09/29/11    left  breast=lymph node,excision benign/ ER/PR=neg, her 2 Positive  . Flexible sigmoidoscopy  2011    multiple   . Port-a-cath removal Right 12/19/2013    Procedure: MINOR REMOVAL PORT-A-CATH;  Surgeon: Adin Hector, MD;  Location: [place];  Service: General;  Laterality: Right;    History   Social History  . Marital Status:  Married    Spouse Name: N/A    Number of Children: 2  . Years of Education: N/A   Occupational History  . Not on file.   Social History Main Topics  . Smoking status: Former Smoker -- 1.00 packs/day for 10 years    Types: Cigarettes    Quit date: 09/22/1985  . Smokeless tobacco: Never Used  . Alcohol Use: No     Comment: 09/29/11 "used to drink socially years ago"  . Drug Use: No  . Sexual Activity: No     Comment: HRT x many yrs   Other Topics Concern  . Not on file   Social History Narrative   Pt gets regular exercise    Current Outpatient Prescriptions on File Prior to Visit  Medication Sig Dispense Refill  . calcium carbonate (TUMS - DOSED IN MG ELEMENTAL CALCIUM) 500 MG chewable tablet Chew 1 tablet by mouth as needed for indigestion or heartburn.    .  Calcium Carbonate-Vitamin D (CALCIUM-VITAMIN D) 600-200 MG-UNIT CAPS Take 1 capsule by mouth daily.      . cyanocobalamin (,VITAMIN B-12,) 1000 MCG/ML injection Inject 1,000 mcg into the muscle every 30 (thirty) days. Next one due the 26th of this month    . desipramine (NOPRAMIN) 10 MG tablet Take 1 tablet (10 mg total) by mouth at bedtime. 30 tablet 5  . diphenoxylate-atropine (LOMOTIL) 2.5-0.025 MG per tablet Take 2-3 times daily as needed for diarrhea. 30 tablet 0  . ergocalciferol (VITAMIN D2) 50000 UNITS capsule Take 1 capsule (50,000 Units total) by mouth once a week. 4 capsule 3  . ferrous sulfate 325 (65 FE) MG tablet Take 325 mg by mouth daily with breakfast.    . fluorometholone (FML) 0.1 % ophthalmic suspension Place 1 drop into both eyes 2 (two) times daily.    Marland Kitchen levothyroxine (SYNTHROID, LEVOTHROID) 150 MCG tablet Take 150 mcg by mouth daily before breakfast.    . levothyroxine (SYNTHROID, LEVOTHROID) 150 MCG tablet TAKE 1 TABLET BY MOUTH DAILY BEFORE BREAKFAST. 30 tablet 0  . levothyroxine (SYNTHROID, LEVOTHROID) 150 MCG tablet TAKE 1 TABLET BY MOUTH DAILY BEFORE BREAKFAST. 30 tablet 0  . loperamide (IMODIUM) 2 MG capsule Take 2 capsules (4 mg total) by mouth every 4 (four) hours as needed for diarrhea or loose stools. 30 capsule 0  . lovastatin (MEVACOR) 20 MG tablet TAKE 1 TABLET (20 MG TOTAL) BY MOUTH DAILY AT 6 PM. 30 tablet 2  . magnesium oxide (MAG-OX) 400 MG tablet Take 400 mg by mouth 3 (three) times daily.    . ondansetron (ZOFRAN) 4 MG tablet Take 1 tablet (4 mg total) by mouth every 8 (eight) hours as needed for nausea or vomiting. 20 tablet 0  . pantoprazole (PROTONIX) 40 MG tablet Take 1 tablet 30 minutes before breakfast and supper 60 tablet 11  . potassium chloride SA (K-DUR,KLOR-CON) 20 MEQ tablet Take 1 tablet (20 mEq total) by mouth daily. 30 tablet 1  . ranitidine (ZANTAC) 300 MG capsule Take 300 mg by mouth every evening.    Marland Kitchen spironolactone (ALDACTONE) 25 MG  tablet Take 25 mg by mouth daily.    Marland Kitchen thiamine 100 MG tablet Take 100 mg by mouth daily.      Marland Kitchen UNABLE TO FIND Take 1 tablet by mouth every morning. Slow Mag- Magnesium Chloride    . zolpidem (AMBIEN) 5 MG tablet TAKE 1 TABLET BY MOUTH AT BEDTIME AS NEEDED SLEEP 30 tablet 5  . calcium gluconate 650 MG  tablet Take 1 tablet (650 mg total) by mouth daily. 30 tablet 2   Current Facility-Administered Medications on File Prior to Visit  Medication Dose Route Frequency Provider Last Rate Last Dose  . acetaminophen (TYLENOL) tablet 1,000 mg  1,000 mg Oral Once Amada Kingfisher, MD   1,000 mg at 08/09/13 1658    Allergies  Allergen Reactions  . Aspirin Other (See Comments)    REACTION: Gi Intolerance/ Burning in stomach  . Codeine Itching  . Trazodone And Nefazodone     "sick"  . Flagyl [Metronidazole] Rash  . Iodine Itching    Allergic to IVP dye  . Morphine And Related Itching    Family History  Problem Relation Age of Onset  . Esophageal cancer Son     deceased  . Diabetes Mother   . Heart disease Mother   . Colon cancer Paternal Uncle   . Kidney disease Sister   . Diabetes Father   . Hypertension Father   . Kidney disease Brother     x 3    BP 128/88 mmHg  Pulse 81  Temp(Src) 97.9 F (36.6 C) (Oral)  Ht 5\' 5"  (1.651 m)  Wt 194 lb (87.998 kg)  BMI 32.28 kg/m2  SpO2 99%    Review of Systems Denies rectal itching.  She has chronic intermittent diarrhea, but no constiption    Objective:   Physical Exam VITAL SIGNS:  See vs page GENERAL: no distress Rectal: no blood seen.  Several large ext hemorrhoids.        Assessment & Plan:  Hemorrhoids, new.   Hypomagnesemia, uncertain etiology.  She was ref to nephrol, but the referral was given a low priority.  Please continue the same medications for this.    Patient is advised the following: Patient Instructions  blood tests are being requested for you today.  We'll let you know about the results.  Please come back for  a follow-up appointment in 3 months.  Please see a specialist.  you will receive a phone call, about a day and time for an appointment.     Hemorrhoids Hemorrhoids are swollen veins around the rectum or anus. There are two types of hemorrhoids:   Internal hemorrhoids. These occur in the veins just inside the rectum. They may poke through to the outside and become irritated and painful.  External hemorrhoids. These occur in the veins outside the anus and can be felt as a painful swelling or hard lump near the anus. CAUSES  Pregnancy.   Obesity.   Constipation or diarrhea.   Straining to have a bowel movement.   Sitting for long periods on the toilet.  Heavy lifting or other activity that caused you to strain.  Anal intercourse. SYMPTOMS   Pain.   Anal itching or irritation.   Rectal bleeding.   Fecal leakage.   Anal swelling.   One or more lumps around the anus.  DIAGNOSIS  Your caregiver may be able to diagnose hemorrhoids by visual examination. Other examinations or tests that may be performed include:   Examination of the rectal area with a gloved hand (digital rectal exam).   Examination of anal canal using a small tube (scope).   A blood test if you have lost a significant amount of blood.  A test to look inside the colon (sigmoidoscopy or colonoscopy). TREATMENT Most hemorrhoids can be treated at home. However, if symptoms do not seem to be getting better or if you have a lot of rectal bleeding,  your caregiver may perform a procedure to help make the hemorrhoids get smaller or remove them completely. Possible treatments include:   Placing a rubber band at the base of the hemorrhoid to cut off the circulation (rubber band ligation).   Injecting a chemical to shrink the hemorrhoid (sclerotherapy).   Using a tool to burn the hemorrhoid (infrared light therapy).   Surgically removing the hemorrhoid (hemorrhoidectomy).   Stapling the  hemorrhoid to block blood flow to the tissue (hemorrhoid stapling).  HOME CARE INSTRUCTIONS   Eat foods with fiber, such as whole grains, beans, nuts, fruits, and vegetables. Ask your doctor about taking products with added fiber in them (fibersupplements).  Increase fluid intake. Drink enough water and fluids to keep your urine clear or pale yellow.   Exercise regularly.   Go to the bathroom when you have the urge to have a bowel movement. Do not wait.   Avoid straining to have bowel movements.   Keep the anal area dry and clean. Use wet toilet paper or moist towelettes after a bowel movement.   Medicated creams and suppositories may be used or applied as directed.   Only take over-the-counter or prescription medicines as directed by your caregiver.   Take warm sitz baths for 15-20 minutes, 3-4 times a day to ease pain and discomfort.   Place ice packs on the hemorrhoids if they are tender and swollen. Using ice packs between sitz baths may be helpful.   Put ice in a plastic bag.   Place a towel between your skin and the bag.   Leave the ice on for 15-20 minutes, 3-4 times a day.   Do not use a donut-shaped pillow or sit on the toilet for long periods. This increases blood pooling and pain.  SEEK MEDICAL CARE IF:  You have increasing pain and swelling that is not controlled by treatment or medicine.  You have uncontrolled bleeding.  You have difficulty or you are unable to have a bowel movement.  You have pain or inflammation outside the area of the hemorrhoids. MAKE SURE YOU:  Understand these instructions.  Will watch your condition.  Will get help right away if you are not doing well or get worse. Document Released: 05/15/2000 Document Revised: 05/04/2012 Document Reviewed: 03/22/2012 [facility] Patient Information 2015 [place], [place]. This information is not intended to replace advice given to you by your health care provider. Make sure you discuss  any questions you have with your health care provider.

## 2014-04-23 ENCOUNTER — Telehealth: Payer: Self-pay

## 2014-04-23 ENCOUNTER — Other Ambulatory Visit: Payer: Self-pay

## 2014-04-23 LAB — CBC WITH DIFFERENTIAL/PLATELET
BASOS ABS: 0 10*3/uL (ref 0.0–0.1)
Basophils Relative: 0.5 % (ref 0.0–3.0)
EOS ABS: 0 10*3/uL (ref 0.0–0.7)
Eosinophils Relative: 1.6 % (ref 0.0–5.0)
HCT: 34.7 % — ABNORMAL LOW (ref 36.0–46.0)
HEMOGLOBIN: 11.3 g/dL — AB (ref 12.0–15.0)
LYMPHS PCT: 43.9 % (ref 12.0–46.0)
Lymphs Abs: 1.4 10*3/uL (ref 0.7–4.0)
MCHC: 32.5 g/dL (ref 30.0–36.0)
MCV: 91.1 fl (ref 78.0–100.0)
MONO ABS: 0.2 10*3/uL (ref 0.1–1.0)
Monocytes Relative: 6.8 % (ref 3.0–12.0)
Neutro Abs: 1.5 10*3/uL (ref 1.4–7.7)
Neutrophils Relative %: 47.2 % (ref 43.0–77.0)
Platelets: 198 10*3/uL (ref 150.0–400.0)
RBC: 3.81 Mil/uL — ABNORMAL LOW (ref 3.87–5.11)
RDW: 13.3 % (ref 11.5–15.5)
WBC: 3.1 10*3/uL — ABNORMAL LOW (ref 4.0–10.5)

## 2014-04-23 LAB — LIPID PANEL
CHOL/HDL RATIO: 2
CHOLESTEROL: 141 mg/dL (ref 0–200)
Cholesterol: 140 mg/dL (ref 0–200)
HDL: 67.4 mg/dL (ref >39.00)
HDL: 68.3 mg/dL (ref >39.00)
LDL CALC: 43 mg/dL (ref 0–99)
LDL Cholesterol: 41 mg/dL (ref 0–99)
NONHDL: 71.7
NonHDL: 73.6
TRIGLYCERIDES: 152 mg/dL — AB (ref 0.0–149.0)
Total CHOL/HDL Ratio: 2
Triglycerides: 152 mg/dL — ABNORMAL HIGH (ref 0.0–149.0)
VLDL: 30.4 mg/dL (ref 0.0–40.0)
VLDL: 30.4 mg/dL (ref 0.0–40.0)

## 2014-04-23 LAB — BASIC METABOLIC PANEL
BUN: 18 mg/dL (ref 6–23)
CO2: 26 meq/L (ref 19–32)
CREATININE: 0.9 mg/dL (ref 0.4–1.2)
Calcium: 9.8 mg/dL (ref 8.4–10.5)
Chloride: 101 mEq/L (ref 96–112)
GFR: 79.6 mL/min (ref >60.00)
Glucose, Bld: 84 mg/dL (ref 70–99)
Potassium: 4.2 mEq/L (ref 3.5–5.1)
SODIUM: 136 meq/L (ref 135–145)

## 2014-04-23 LAB — MAGNESIUM: MAGNESIUM: 1.5 mg/dL (ref 1.5–2.5)

## 2014-04-23 LAB — TSH: TSH: 0.41 u[IU]/mL (ref 0.35–4.50)

## 2014-04-23 LAB — HEMOGLOBIN A1C: HEMOGLOBIN A1C: 5.8 % (ref 4.6–6.5)

## 2014-04-23 LAB — IBC PANEL
Iron: 85 ug/dL (ref 42–145)
Saturation Ratios: 21 % (ref 20.0–50.0)
Transferrin: 288.5 mg/dL (ref 212.0–360.0)

## 2014-04-23 LAB — PHOSPHORUS: PHOSPHORUS: 3.6 mg/dL (ref 2.3–4.6)

## 2014-04-23 LAB — VITAMIN D 25 HYDROXY (VIT D DEFICIENCY, FRACTURES): VITD: 25.83 ng/mL — ABNORMAL LOW (ref 30.00–100.00)

## 2014-04-23 MED ORDER — LOVASTATIN 20 MG PO TABS: ORAL_TABLET | ORAL | Status: DC

## 2014-04-23 MED ORDER — LEVOTHYROXINE SODIUM 150 MCG PO TABS: ORAL_TABLET | ORAL | Status: DC

## 2014-04-23 MED ORDER — DESIPRAMINE HCL 10 MG PO TABS: 10.0000 mg | ORAL_TABLET | Freq: Every day | ORAL | Status: DC

## 2014-04-23 NOTE — Telephone Encounter (Signed)
Rx sent to pharmacy   

## 2014-04-23 NOTE — Telephone Encounter (Signed)
ok 

## 2014-04-23 NOTE — Telephone Encounter (Signed)
Received a refill request Desipramine 10 mg for a 90 day supply. Rx was sent 03/08/2014 for a 30 day supply. Ok to resend for a 90 day supply? Thanks!

## 2014-04-24 LAB — PTH, INTACT AND CALCIUM
Calcium: 9.6 mg/dL (ref 8.4–10.5)
PTH: 65 pg/mL — ABNORMAL HIGH (ref 14–64)

## 2014-04-27 ENCOUNTER — Encounter: Payer: Self-pay | Admitting: *Deleted

## 2014-04-27 NOTE — [facility] Oncology Navigator Note (Signed)
I called patient to check in.  Patient reports that she has been having some rectal bleeding.  She was seen by Dr. Loanne Drilling on 04/20/14 who told her that the bleeding was coming from hemorrhoids.  Patient reports that she had heavier bleeding yesterday and with laying around today, there has been much less today.  Patient has tried to call [facility] today for an appointment with [name] but they are closed.  I advised her to call and discuss with his office on Monday since the rectal bleeding is new since she was evaluated 02/15/14.  I advised her that if she has heavy bleeding to go to the emergency room.  I discussed with Charlestine Massed, NP.  Patient reports that she is doing well and feels fine except for her usual diarrhea and the rectal bleeding.  I encouraged her to call me for any needs or assistance.

## 2014-05-02 ENCOUNTER — Encounter: Payer: Self-pay | Admitting: Gastroenterology

## 2014-05-02 ENCOUNTER — Ambulatory Visit (INDEPENDENT_AMBULATORY_CARE_PROVIDER_SITE_OTHER): Payer: Medicare Other | Admitting: Gastroenterology

## 2014-05-02 VITALS — BP 108/78 | HR 68 | Ht 65.0 in | Wt 193.4 lb

## 2014-05-02 DIAGNOSIS — K644 Residual hemorrhoidal skin tags: Secondary | ICD-10-CM | POA: Insufficient documentation

## 2014-05-02 DIAGNOSIS — K648 Other hemorrhoids: Secondary | ICD-10-CM

## 2014-05-02 DIAGNOSIS — K625 Hemorrhage of anus and rectum: Secondary | ICD-10-CM | POA: Insufficient documentation

## 2014-05-02 MED ORDER — DILTIAZEM GEL 2 %: 1.0000 "application " | Freq: Two times a day (BID) | CUTANEOUS | Status: DC

## 2014-05-02 MED ORDER — PRAMOXINE-HC 1-1 % EX CREA: TOPICAL_CREAM | Freq: Two times a day (BID) | CUTANEOUS | Status: DC

## 2014-05-02 NOTE — Patient Instructions (Signed)
We have sent the following medications to [facility] for you to pick up at your convenience: Diltiazem Gel 2 %, apply twice daily   We have sent the following medications to [facility] for you to pick up at your convenience: Analpram Cream, apply twice daily   You will be due for an office visit in three weeks. We will send you a reminder in the mail when it gets closer to that time.

## 2014-05-02 NOTE — Progress Notes (Signed)
     05/02/2014 [name] [id number] Jun 30, 1945   History of Present Illness:  This is a 68 year old female who is previously known to Dr. Sharlett Iles.  Her last colonoscopy was in 09/2010 at which time she was found to have internal and external hemorrhoids.  She presents to our office today with complaints of rectal bleeding.  Says that it occurred in the past intermittently, but for the past month it has been more frequent, occuring a few times per week, but not daily.  Described as bright red blood, sometimes small amounts and sometimes more.  Saw a couple small clots as well.  Has been using preparation H OTC.  Hgb on 11/20 was 11.3 grams, which is actually the highest that it has been in a while.  Iron levels are normal, although she has been on iron in the form of ferrous sulfate 325 mg daily for a while.  Denies constipation.  Has issues with some diarrhea/loose stools with occasional incontinence, which has been addressed here in the past.  Takes Imodium or Lomotil prn.  TSH is normal recently.  Also complains of some discomfort with BM at times since this bleeding began.  Current Medications, Allergies, Past Medical History, Past Surgical History, Family History and Social History were reviewed in [employer] record.   Physical Exam: BP 108/78 mmHg  Pulse 68  Ht 5\' 5"  (1.651 m)  Wt 193 lb 6 oz (87.714 kg)  BMI 32.18 kg/m2 General: Well developed black female in no acute distress Head: Normocephalic and atraumatic Eyes:  Sclerae anicteric, conjunctiva pink  Ears: Normal auditory acuity Lungs: Clear throughout to auscultation Heart: Regular rate and rhythm Abdomen: Soft, non-distended.  Normal bowel sounds.  Multiple scars noted on abdomen.  Mild diffuse TTP. Rectal:  Large external hemorrhoids seen without acute bleeding/inflammation/thrombosis.  A lot of discomfort present with DRE.  No masses felt.  Light brown stool noted on exam glove, maybe trace  positive for blood.  Anoscopy not performed due to discomfort on DRE. Musculoskeletal: Symmetrical with no gross deformities  Extremities: No edema  Neurological: Alert oriented x 4, grossly non-focal Psychological:  Alert and cooperative. Normal mood and affect  Assessment and Recommendation: -Large external hemorrhoids, has history of internal hemorrhoids as well.  Suspect that she may also have an anal fissure although one not definitely seen on exam today.  Will treat with diltiazem gel 2% applied BID.  Will also give analpram cream to apply externally BID as well.  Rectal care instructions reviewed.  Follow-up in 2-3 weeks.

## 2014-05-02 NOTE — Progress Notes (Signed)
Reviewed and agree with management plan. If internal and external hemorrhoids remain symptomatic will recommend surgical referral for mgmt.   Pricilla Riffle. Fuller Plan, MD [facility]

## 2014-05-14 ENCOUNTER — Other Ambulatory Visit: Payer: Self-pay | Admitting: Endocrinology

## 2014-05-21 ENCOUNTER — Telehealth: Payer: Self-pay | Admitting: Endocrinology

## 2014-05-21 MED ORDER — OXYCODONE-ACETAMINOPHEN 7.5-325 MG PO TABS: ORAL_TABLET | ORAL | Status: DC

## 2014-05-21 NOTE — Telephone Encounter (Signed)
Pt is needing a refill on her Oxycodone. Rx was last refilled on 04/20/2014. Could you sign for a 30 day supply during Dr. Cordelia Pen absence? Thanks!

## 2014-05-21 NOTE — Telephone Encounter (Signed)
ok 

## 2014-05-21 NOTE — Telephone Encounter (Signed)
Pt needs refill on oxycodone. Please advise

## 2014-05-21 NOTE — Telephone Encounter (Signed)
Pt advised that rx is ready for pick up. Rx placed up front.  

## 2014-05-30 ENCOUNTER — Ambulatory Visit: Payer: Medicare Other

## 2014-05-31 ENCOUNTER — Other Ambulatory Visit (INDEPENDENT_AMBULATORY_CARE_PROVIDER_SITE_OTHER): Payer: Medicare Other | Admitting: *Deleted

## 2014-05-31 ENCOUNTER — Ambulatory Visit (INDEPENDENT_AMBULATORY_CARE_PROVIDER_SITE_OTHER): Payer: Medicare Other | Admitting: *Deleted

## 2014-05-31 DIAGNOSIS — E538 Deficiency of other specified B group vitamins: Secondary | ICD-10-CM

## 2014-05-31 MED ORDER — CYANOCOBALAMIN 1000 MCG/ML IJ SOLN
1000.0000 ug | Freq: Once | INTRAMUSCULAR | Status: AC
  Administered 2014-05-31: 1000 ug via INTRAMUSCULAR

## 2014-06-16 ENCOUNTER — Encounter: Payer: Self-pay | Admitting: Gastroenterology

## 2014-06-26 ENCOUNTER — Encounter: Payer: Self-pay | Admitting: Endocrinology

## 2014-06-26 ENCOUNTER — Ambulatory Visit (INDEPENDENT_AMBULATORY_CARE_PROVIDER_SITE_OTHER): Payer: Medicare Other | Admitting: Endocrinology

## 2014-06-26 VITALS — BP 126/86 | HR 90 | Temp 98.6°F | Ht 65.0 in | Wt 195.0 lb

## 2014-06-26 DIAGNOSIS — R252 Cramp and spasm: Secondary | ICD-10-CM | POA: Diagnosis not present

## 2014-06-26 DIAGNOSIS — E538 Deficiency of other specified B group vitamins: Secondary | ICD-10-CM

## 2014-06-26 DIAGNOSIS — E559 Vitamin D deficiency, unspecified: Secondary | ICD-10-CM

## 2014-06-26 LAB — VITAMIN D 25 HYDROXY (VIT D DEFICIENCY, FRACTURES): VITD: 20.59 ng/mL — ABNORMAL LOW (ref 30.00–100.00)

## 2014-06-26 LAB — BASIC METABOLIC PANEL
BUN: 21 mg/dL (ref 6–23)
CO2: 27 mEq/L (ref 19–32)
CREATININE: 0.89 mg/dL (ref 0.40–1.20)
Calcium: 9.3 mg/dL (ref 8.4–10.5)
Chloride: 105 mEq/L (ref 96–112)
GFR: 80.59 mL/min (ref >60.00)
GLUCOSE: 88 mg/dL (ref 70–99)
POTASSIUM: 4.2 meq/L (ref 3.5–5.1)
Sodium: 140 mEq/L (ref 135–145)

## 2014-06-26 LAB — MAGNESIUM: MAGNESIUM: 1.1 mg/dL — AB (ref 1.5–2.5)

## 2014-06-26 LAB — PHOSPHORUS: PHOSPHORUS: 3.2 mg/dL (ref 2.3–4.6)

## 2014-06-26 MED ORDER — OXYCODONE-ACETAMINOPHEN 7.5-325 MG PO TABS: ORAL_TABLET | ORAL | Status: DC

## 2014-06-26 MED ORDER — CYANOCOBALAMIN 1000 MCG/ML IJ SOLN
1000.0000 ug | Freq: Once | INTRAMUSCULAR | Status: AC
  Administered 2014-06-26: 1000 ug via INTRAMUSCULAR

## 2014-06-26 NOTE — Patient Instructions (Addendum)
blood tests are being requested for you today.  We'll let you know about the results.  Please see Dr Posey Pronto as scheduled next week, for the magnesium.  If I was you, I would go along with Dr Darrel Hoover advice about the surgery.

## 2014-06-26 NOTE — Progress Notes (Signed)
Subjective:    Patient ID: [name], female    DOB: 07/09/1945, 69 y.o.   MRN: [id number]  HPI Pt states 1 week of moderate cramping of the fingers and toes.  She has slight assoc numbness of the toes.  She was unable to get to last week's nephrol appt, due to snow.   Past Medical History  Diagnosis Date  . Obesity   . PUD (peptic ulcer disease)   . Leukopenia   . Short bowel syndrome   . Internal hemorrhoids without mention of complication   . Stricture and stenosis of esophagus   . Osteoarthrosis, unspecified whether generalized or localized, unspecified site   . Thyrotoxicosis without mention of goiter or other cause, without mention of thyrotoxic crisis or storm   . Other and unspecified hyperlipidemia   . Gout, unspecified   . Diverticulosis of colon (without mention of hemorrhage)   . C. difficile colitis   . VITAMIN B12 DEFICIENCY 08/30/2009  . GOITER, MULTINODULAR 04/02/2009  . HYPOTHYROIDISM, POST-RADIATION 08/13/2009  . ASYMPTOMATIC POSTMENOPAUSAL STATUS 10/11/2008  . Esophageal reflux 06/12/2008  . PONV (postoperative nausea and vomiting)   . Varicose veins   . Blood transfusion   . UTI (urinary tract infection)   . Kidney stones     "several"  . Unspecified essential hypertension   . Angina   . Shortness of breath on exertion     "sometimes"  . ANEMIA, IRON DEFICIENCY 05/08/2009  . History of lower GI bleeding   . H/O hiatal hernia   . Migraines   . Breast cancer 09/29/11    invasive grade III ductal ca,assoc high grade dcis,ER/PR=neg  . History of radiation therapy 02/08/12-03/25/12    left breast,total 61gy  . Blood transfusion without reported diagnosis   . Hypomagnesemia   . Hypokalemia 05/11/2013  . Gastroparesis   . Type II or unspecified type diabetes mellitus without mention of complication, not stated as uncontrolled     no med in years diet controled    Past Surgical History  Procedure Laterality Date  . Vein ligation and stripping  1980's     Right leg  . Lithotripsy      "4 or 5 times"  . Bunionectomy  1970's    bilateral  . Colonoscopy  2012    multiple   . Esophagogastroduodenoscopy  2011    multiple   . Thyroid ultrasound  12/1994 and 12/1995  . Mastectomy w/ nodes partial  09/29/11    left  . Cholecystectomy  1990's  . Abdominal hysterectomy  1970's    with BSO  . Dilation and curettage of uterus    . Colon surgery      "several surgeries for short bowel syndrome"  . Abdominal adhesion surgery  1980's thru 1990's    "several"  . Kidney stone surgery  1990's    "tried to go up & get it but pushed it further up"  . Portacath placement  09/29/2011    Procedure: INSERTION PORT-A-CATH;  Surgeon: Adin Hector, MD;  Location: [place];  Service: General;  Laterality: N/A;  . Breast biopsy  08/13/11    left breast lower inner quadrant  . Breast lumpectomy w/ needle localization  09/29/11    left  breast=lymph node,excision benign/ ER/PR=neg, her 2 Positive  . Flexible sigmoidoscopy  2011    multiple   . Port-a-cath removal Right 12/19/2013    Procedure: MINOR REMOVAL PORT-A-CATH;  Surgeon: Adin Hector, MD;  Location:  [place];  Service: General;  Laterality: Right;    History   Social History  . Marital Status: Married    Spouse Name: N/A    Number of Children: 2  . Years of Education: N/A   Occupational History  . Not on file.   Social History Main Topics  . Smoking status: Former Smoker -- 1.00 packs/day for 10 years    Types: Cigarettes    Quit date: 09/22/1985  . Smokeless tobacco: Never Used  . Alcohol Use: No     Comment: 09/29/11 "used to drink socially years ago"  . Drug Use: No  . Sexual Activity: No     Comment: HRT x many yrs   Other Topics Concern  . Not on file   Social History Narrative   Pt gets regular exercise    Current Outpatient Prescriptions on File Prior to Visit  Medication Sig Dispense Refill  . calcium carbonate (TUMS - DOSED IN MG ELEMENTAL CALCIUM)  500 MG chewable tablet Chew 1 tablet by mouth as needed for indigestion or heartburn.    . Calcium Carbonate-Vitamin D (CALCIUM-VITAMIN D) 600-200 MG-UNIT CAPS Take 1 capsule by mouth daily.      . cyanocobalamin (,VITAMIN B-12,) 1000 MCG/ML injection Inject 1,000 mcg into the muscle every 30 (thirty) days. Next one due the 26th of this month    . desipramine (NOPRAMIN) 10 MG tablet Take 1 tablet (10 mg total) by mouth at bedtime. 90 tablet 1  . diltiazem 2 % GEL Apply 1 application topically 2 (two) times daily. 30 g 0  . diphenoxylate-atropine (LOMOTIL) 2.5-0.025 MG per tablet Take 2-3 times daily as needed for diarrhea. 30 tablet 0  . ferrous sulfate 325 (65 FE) MG tablet Take 325 mg by mouth daily with breakfast.    . fluorometholone (FML) 0.1 % ophthalmic suspension Place 1 drop into both eyes 2 (two) times daily.    Marland Kitchen levothyroxine (SYNTHROID, LEVOTHROID) 150 MCG tablet Take 150 mcg by mouth daily before breakfast.    . levothyroxine (SYNTHROID, LEVOTHROID) 150 MCG tablet TAKE 1 TABLET BY MOUTH DAILY BEFORE BREAKFAST. 30 tablet 0  . levothyroxine (SYNTHROID, LEVOTHROID) 150 MCG tablet TAKE 1 TABLET BY MOUTH DAILY BEFORE BREAKFAST. 90 tablet 1  . levothyroxine (SYNTHROID, LEVOTHROID) 150 MCG tablet TAKE 1 TABLET BY MOUTH DAILY BEFORE BREAKFAST. 30 tablet 0  . loperamide (IMODIUM) 2 MG capsule Take 2 capsules (4 mg total) by mouth every 4 (four) hours as needed for diarrhea or loose stools. 30 capsule 0  . lovastatin (MEVACOR) 20 MG tablet TAKE 1 TABLET (20 MG TOTAL) BY MOUTH DAILY AT 6 PM. 90 tablet 1  . magnesium oxide (MAG-OX) 400 MG tablet Take 400 mg by mouth 3 (three) times daily.    . ondansetron (ZOFRAN) 4 MG tablet Take 1 tablet (4 mg total) by mouth every 8 (eight) hours as needed for nausea or vomiting. 20 tablet 0  . pantoprazole (PROTONIX) 40 MG tablet Take 1 tablet 30 minutes before breakfast and supper 60 tablet 11  . pramoxine-hydrocortisone (ANALPRAM HC) cream Apply topically 2  (two) times daily. 30 g 0  . ranitidine (ZANTAC) 300 MG capsule Take 300 mg by mouth every evening.    . thiamine 100 MG tablet Take 100 mg by mouth daily.      Marland Kitchen UNABLE TO FIND Take 1 tablet by mouth every morning. Slow Mag- Magnesium Chloride    . zolpidem (AMBIEN) 5 MG tablet TAKE 1 TABLET BY  MOUTH AT BEDTIME AS NEEDED SLEEP 30 tablet 5  . calcium gluconate 650 MG tablet Take 1 tablet (650 mg total) by mouth daily. 30 tablet 2   Current Facility-Administered Medications on File Prior to Visit  Medication Dose Route Frequency Provider Last Rate Last Dose  . acetaminophen (TYLENOL) tablet 1,000 mg  1,000 mg Oral Once Amada Kingfisher, MD   1,000 mg at 08/09/13 1658    Allergies  Allergen Reactions  . Aspirin Other (See Comments)    REACTION: Gi Intolerance/ Burning in stomach  . Codeine Itching  . Trazodone And Nefazodone     "sick"  . Flagyl [Metronidazole] Rash  . Iodine Itching    Allergic to IVP dye  . Morphine And Related Itching    Family History  Problem Relation Age of Onset  . Esophageal cancer Son     deceased  . Diabetes Mother   . Heart disease Mother   . Colon cancer Paternal Uncle   . Kidney disease Sister   . Diabetes Father   . Hypertension Father   . Kidney disease Brother     x 3    BP 126/86 mmHg  Pulse 90  Temp(Src) 98.6 F (37 C) (Oral)  Ht 5\' 5"  (1.651 m)  Wt 195 lb (88.451 kg)  BMI 32.45 kg/m2  SpO2 99%   Review of Systems Diarrhea is mild.  She denies edema    Objective:   Physical Exam VITAL SIGNS:  See vs page GENERAL: no distress MSK: normal strength of the hands Ext: no edema of the legs.     Vit-D=low  Lab Results  Component Value Date   CREATININE 0.89 06/26/2014   BUN 21 06/26/2014   NA 140 06/26/2014   K 4.2 06/26/2014   CL 105 06/26/2014   CO2 27 06/26/2014   Mg++=low    Assessment & Plan:  Vit-D deficiency, mild exacerbation Cramps of extremities, new, uncertain etiology. Hypomagnesemia, uncertain etiology,  persistent.    Patient is advised the following: Patient Instructions  blood tests are being requested for you today.  We'll let you know about the results.  Please see Dr Posey Pronto as scheduled next week, for the magnesium.  If I was you, I would go along with Dr Darrel Hoover advice about the surgery.

## 2014-06-27 ENCOUNTER — Other Ambulatory Visit: Payer: Self-pay | Admitting: Endocrinology

## 2014-06-27 DIAGNOSIS — E876 Hypokalemia: Secondary | ICD-10-CM

## 2014-06-27 DIAGNOSIS — E559 Vitamin D deficiency, unspecified: Secondary | ICD-10-CM

## 2014-06-27 DIAGNOSIS — C50919 Malignant neoplasm of unspecified site of unspecified female breast: Secondary | ICD-10-CM

## 2014-06-27 LAB — PTH, INTACT AND CALCIUM
CALCIUM: 9.6 mg/dL (ref 8.4–10.5)
PTH: 71 pg/mL — AB (ref 14–64)

## 2014-06-27 MED ORDER — ERGOCALCIFEROL 1.25 MG (50000 UT) PO CAPS: ORAL_CAPSULE | ORAL | Status: DC

## 2014-07-02 ENCOUNTER — Ambulatory Visit
Admission: RE | Admit: 2014-07-02 | Discharge: 2014-07-02 | Disposition: A | Discharge status: Home / Self Care | Payer: Medicare Other | Source: Ambulatory Visit | Attending: General Surgery | Admitting: General Surgery

## 2014-07-02 DIAGNOSIS — I700 Atherosclerosis of aorta: Secondary | ICD-10-CM | POA: Diagnosis not present

## 2014-07-02 DIAGNOSIS — N739 Female pelvic inflammatory disease, unspecified: Secondary | ICD-10-CM | POA: Diagnosis not present

## 2014-07-02 DIAGNOSIS — R197 Diarrhea, unspecified: Secondary | ICD-10-CM

## 2014-07-02 DIAGNOSIS — R1084 Generalized abdominal pain: Secondary | ICD-10-CM

## 2014-07-02 MED ORDER — IOHEXOL 300 MG/ML  SOLN
100.0000 mL | Freq: Once | INTRAMUSCULAR | Status: AC | PRN
  Administered 2014-07-02: 100 mL via INTRAVENOUS

## 2014-07-03 ENCOUNTER — Ambulatory Visit: Payer: Medicare Other

## 2014-07-03 DIAGNOSIS — I10 Essential (primary) hypertension: Secondary | ICD-10-CM | POA: Diagnosis not present

## 2014-07-03 DIAGNOSIS — K912 Postsurgical malabsorption, not elsewhere classified: Secondary | ICD-10-CM | POA: Diagnosis not present

## 2014-07-08 ENCOUNTER — Other Ambulatory Visit: Payer: Self-pay | Admitting: Endocrinology

## 2014-07-10 DIAGNOSIS — Z6832 Body mass index (BMI) 32.0-32.9, adult: Secondary | ICD-10-CM | POA: Diagnosis not present

## 2014-07-10 DIAGNOSIS — K529 Noninfective gastroenteritis and colitis, unspecified: Secondary | ICD-10-CM | POA: Diagnosis not present

## 2014-07-10 DIAGNOSIS — R188 Other ascites: Secondary | ICD-10-CM | POA: Diagnosis not present

## 2014-07-10 DIAGNOSIS — Z853 Personal history of malignant neoplasm of breast: Secondary | ICD-10-CM | POA: Diagnosis not present

## 2014-07-10 DIAGNOSIS — A047 Enterocolitis due to Clostridium difficile: Secondary | ICD-10-CM | POA: Diagnosis not present

## 2014-07-12 ENCOUNTER — Emergency Department (HOSPITAL_COMMUNITY)
Admission: EM | Admit: 2014-07-12 | Discharge: 2014-07-12 | Disposition: A | Discharge status: Home / Self Care | Payer: Medicare Other | Attending: Emergency Medicine | Admitting: Emergency Medicine

## 2014-07-12 ENCOUNTER — Emergency Department (HOSPITAL_COMMUNITY): Payer: Medicare Other

## 2014-07-12 ENCOUNTER — Encounter (HOSPITAL_COMMUNITY): Payer: Self-pay | Admitting: Emergency Medicine

## 2014-07-12 DIAGNOSIS — E876 Hypokalemia: Secondary | ICD-10-CM | POA: Insufficient documentation

## 2014-07-12 DIAGNOSIS — Z87442 Personal history of urinary calculi: Secondary | ICD-10-CM | POA: Insufficient documentation

## 2014-07-12 DIAGNOSIS — G43909 Migraine, unspecified, not intractable, without status migrainosus: Secondary | ICD-10-CM | POA: Insufficient documentation

## 2014-07-12 DIAGNOSIS — E0590 Thyrotoxicosis, unspecified without thyrotoxic crisis or storm: Secondary | ICD-10-CM | POA: Insufficient documentation

## 2014-07-12 DIAGNOSIS — E039 Hypothyroidism, unspecified: Secondary | ICD-10-CM | POA: Insufficient documentation

## 2014-07-12 DIAGNOSIS — E539 Vitamin B deficiency, unspecified: Secondary | ICD-10-CM | POA: Diagnosis not present

## 2014-07-12 DIAGNOSIS — M542 Cervicalgia: Secondary | ICD-10-CM | POA: Insufficient documentation

## 2014-07-12 DIAGNOSIS — E785 Hyperlipidemia, unspecified: Secondary | ICD-10-CM | POA: Diagnosis not present

## 2014-07-12 DIAGNOSIS — Z9049 Acquired absence of other specified parts of digestive tract: Secondary | ICD-10-CM | POA: Insufficient documentation

## 2014-07-12 DIAGNOSIS — K219 Gastro-esophageal reflux disease without esophagitis: Secondary | ICD-10-CM | POA: Insufficient documentation

## 2014-07-12 DIAGNOSIS — E119 Type 2 diabetes mellitus without complications: Secondary | ICD-10-CM | POA: Diagnosis not present

## 2014-07-12 DIAGNOSIS — Z87891 Personal history of nicotine dependence: Secondary | ICD-10-CM | POA: Diagnosis not present

## 2014-07-12 DIAGNOSIS — Z923 Personal history of irradiation: Secondary | ICD-10-CM | POA: Diagnosis not present

## 2014-07-12 DIAGNOSIS — Z87440 Personal history of urinary (tract) infections: Secondary | ICD-10-CM | POA: Diagnosis not present

## 2014-07-12 DIAGNOSIS — Z8619 Personal history of other infectious and parasitic diseases: Secondary | ICD-10-CM | POA: Insufficient documentation

## 2014-07-12 DIAGNOSIS — M1990 Unspecified osteoarthritis, unspecified site: Secondary | ICD-10-CM | POA: Insufficient documentation

## 2014-07-12 DIAGNOSIS — Z79899 Other long term (current) drug therapy: Secondary | ICD-10-CM | POA: Insufficient documentation

## 2014-07-12 DIAGNOSIS — Z853 Personal history of malignant neoplasm of breast: Secondary | ICD-10-CM | POA: Insufficient documentation

## 2014-07-12 DIAGNOSIS — Z7952 Long term (current) use of systemic steroids: Secondary | ICD-10-CM | POA: Diagnosis not present

## 2014-07-12 DIAGNOSIS — Z8711 Personal history of peptic ulcer disease: Secondary | ICD-10-CM | POA: Insufficient documentation

## 2014-07-12 DIAGNOSIS — E669 Obesity, unspecified: Secondary | ICD-10-CM | POA: Insufficient documentation

## 2014-07-12 DIAGNOSIS — M4802 Spinal stenosis, cervical region: Secondary | ICD-10-CM | POA: Diagnosis not present

## 2014-07-12 DIAGNOSIS — I209 Angina pectoris, unspecified: Secondary | ICD-10-CM | POA: Insufficient documentation

## 2014-07-12 DIAGNOSIS — Z90710 Acquired absence of both cervix and uterus: Secondary | ICD-10-CM | POA: Diagnosis not present

## 2014-07-12 DIAGNOSIS — M62838 Other muscle spasm: Secondary | ICD-10-CM | POA: Diagnosis not present

## 2014-07-12 DIAGNOSIS — D509 Iron deficiency anemia, unspecified: Secondary | ICD-10-CM | POA: Insufficient documentation

## 2014-07-12 DIAGNOSIS — I10 Essential (primary) hypertension: Secondary | ICD-10-CM | POA: Insufficient documentation

## 2014-07-12 MED ORDER — PREDNISONE 20 MG PO TABS: ORAL_TABLET | ORAL | Status: DC

## 2014-07-12 MED ORDER — OXYCODONE-ACETAMINOPHEN 5-325 MG PO TABS
2.0000 | ORAL_TABLET | Freq: Once | ORAL | Status: AC
Start: 2014-07-12 — End: 2014-07-12
  Administered 2014-07-12: 2 via ORAL
  Filled 2014-07-12: qty 2

## 2014-07-12 MED ORDER — METHOCARBAMOL 500 MG PO TABS
1000.0000 mg | ORAL_TABLET | Freq: Once | ORAL | Status: AC
  Administered 2014-07-12: 1000 mg via ORAL
  Filled 2014-07-12: qty 2

## 2014-07-12 MED ORDER — PREDNISONE (PAK) 10 MG PO TABS: ORAL_TABLET | Freq: Every day | ORAL | Status: DC

## 2014-07-12 MED ORDER — PREDNISONE 20 MG PO TABS
60.0000 mg | ORAL_TABLET | Freq: Once | ORAL | Status: AC
  Administered 2014-07-12: 60 mg via ORAL
  Filled 2014-07-12: qty 3

## 2014-07-12 MED ORDER — HYDROCODONE-ACETAMINOPHEN 5-325 MG PO TABS: 1.0000 | ORAL_TABLET | Freq: Four times a day (QID) | ORAL | Status: DC | PRN

## 2014-07-12 MED ORDER — ORPHENADRINE CITRATE ER 100 MG PO TB12: 100.0000 mg | ORAL_TABLET | Freq: Two times a day (BID) | ORAL | Status: DC | PRN

## 2014-07-12 NOTE — ED Notes (Signed)
Pt returned from xray

## 2014-07-12 NOTE — ED Notes (Signed)
Pt reports waking up Monday morning with rt sided neck pain that radiates to rt shoulder. Pt states she took some aleve, hydrocortisone, heating pads, and benedryl for sx with no relief. Pt rates pain 10/10. Pt states she is unable to move her neck. Describes pain as a pulled muscle.

## 2014-07-12 NOTE — ED Notes (Signed)
Dr. Pfiefer at bedside  

## 2014-07-12 NOTE — ED Provider Notes (Signed)
CSN: [id number]     Arrival date & time 07/12/14  0615 History   First MD Initiated Contact with Patient 07/12/14 0630     Chief Complaint  Patient presents with  . Neck Pain  . Shoulder Pain     (Consider location/radiation/quality/duration/timing/severity/associated sxs/prior Treatment) HPI   Patient presents with right sided neck pain that radiates into her right shoulder.  The pain is severe, constant, worse with movement of her neck.  It began when she woke up 4 days ago.  She has used topical medications such as biofreeze, and has taken oxycodone and benadryl without relief.  Denies fevers, chills, weakness or numbness in her right arm, chest pain, SOB.  She has had problems with muscle spasms in her legs recently due to hypomagnesemia, followed by Dr Posey Pronto - states her legs currently feel better.  She is taking magnesium pills at home.  Denies any recent falls or heavy lifting.    Past Medical History  Diagnosis Date  . Obesity   . PUD (peptic ulcer disease)   . Leukopenia   . Short bowel syndrome   . Internal hemorrhoids without mention of complication   . Stricture and stenosis of esophagus   . Osteoarthrosis, unspecified whether generalized or localized, unspecified site   . Thyrotoxicosis without mention of goiter or other cause, without mention of thyrotoxic crisis or storm   . Other and unspecified hyperlipidemia   . Gout, unspecified   . Diverticulosis of colon (without mention of hemorrhage)   . C. difficile colitis   . VITAMIN B12 DEFICIENCY 08/30/2009  . GOITER, MULTINODULAR 04/02/2009  . HYPOTHYROIDISM, POST-RADIATION 08/13/2009  . ASYMPTOMATIC POSTMENOPAUSAL STATUS 10/11/2008  . Esophageal reflux 06/12/2008  . PONV (postoperative nausea and vomiting)   . Varicose veins   . Blood transfusion   . UTI (urinary tract infection)   . Kidney stones     "several"  . Unspecified essential hypertension   . Angina   . Shortness of breath on exertion     "sometimes"  .  ANEMIA, IRON DEFICIENCY 05/08/2009  . History of lower GI bleeding   . H/O hiatal hernia   . Migraines   . Breast cancer 09/29/11    invasive grade III ductal ca,assoc high grade dcis,ER/PR=neg  . History of radiation therapy 02/08/12-03/25/12    left breast,total 61gy  . Blood transfusion without reported diagnosis   . Hypomagnesemia   . Hypokalemia 05/11/2013  . Gastroparesis   . Type II or unspecified type diabetes mellitus without mention of complication, not stated as uncontrolled     no med in years diet controled   Past Surgical History  Procedure Laterality Date  . Vein ligation and stripping  1980's    Right leg  . Lithotripsy      "4 or 5 times"  . Bunionectomy  1970's    bilateral  . Colonoscopy  2012    multiple   . Esophagogastroduodenoscopy  2011    multiple   . Thyroid ultrasound  12/1994 and 12/1995  . Mastectomy w/ nodes partial  09/29/11    left  . Cholecystectomy  1990's  . Abdominal hysterectomy  1970's    with BSO  . Dilation and curettage of uterus    . Colon surgery      "several surgeries for short bowel syndrome"  . Abdominal adhesion surgery  1980's thru 1990's    "several"  . Kidney stone surgery  1990's    "tried to go up &  get it but pushed it further up"  . Portacath placement  09/29/2011    Procedure: INSERTION PORT-A-CATH;  Surgeon: Adin Hector, MD;  Location: [place];  Service: General;  Laterality: N/A;  . Breast biopsy  08/13/11    left breast lower inner quadrant  . Breast lumpectomy w/ needle localization  09/29/11    left  breast=lymph node,excision benign/ ER/PR=neg, her 2 Positive  . Flexible sigmoidoscopy  2011    multiple   . Port-a-cath removal Right 12/19/2013    Procedure: MINOR REMOVAL PORT-A-CATH;  Surgeon: Adin Hector, MD;  Location: [place];  Service: General;  Laterality: Right;   Family History  Problem Relation Age of Onset  . Esophageal cancer Son     deceased  . Diabetes Mother   . Heart  disease Mother   . Colon cancer Paternal Uncle   . Kidney disease Sister   . Diabetes Father   . Hypertension Father   . Kidney disease Brother     x 3   History  Substance Use Topics  . Smoking status: Former Smoker -- 1.00 packs/day for 10 years    Types: Cigarettes    Quit date: 09/22/1985  . Smokeless tobacco: Never Used  . Alcohol Use: No     Comment: 09/29/11 "used to drink socially years ago"   OB History    No data available     Review of Systems  All other systems reviewed and are negative.     Allergies  Aspirin; Codeine; Trazodone and nefazodone; Flagyl; Iodine; and Morphine and related  Home Medications   Prior to Admission medications   Medication Sig Start Date End Date Taking? Authorizing Provider  aMILoride (MIDAMOR) 5 MG tablet  06/11/14   Historical Provider, MD  calcium carbonate (TUMS - DOSED IN MG ELEMENTAL CALCIUM) 500 MG chewable tablet Chew 1 tablet by mouth as needed for indigestion or heartburn.    Historical Provider, MD  Calcium Carbonate-Vitamin D (CALCIUM-VITAMIN D) 600-200 MG-UNIT CAPS Take 1 capsule by mouth daily.      Historical Provider, MD  calcium gluconate 650 MG tablet Take 1 tablet (650 mg total) by mouth daily. 01/14/12 01/13/13  Deatra Robinson, MD  cyanocobalamin (,VITAMIN B-12,) 1000 MCG/ML injection Inject 1,000 mcg into the muscle every 30 (thirty) days. Next one due the 26th of this month    Historical Provider, MD  desipramine (NOPRAMIN) 10 MG tablet Take 1 tablet (10 mg total) by mouth at bedtime. 04/23/14   Renato Shin, MD  diltiazem 2 % GEL Apply 1 application topically 2 (two) times daily. 05/02/14   Laban Emperor. Zehr, PA-C  diphenoxylate-atropine (LOMOTIL) 2.5-0.025 MG per tablet Take 2-3 times daily as needed for diarrhea. 02/14/14   Willia Craze, NP  ergocalciferol (VITAMIN D2) 50000 UNITS capsule 1 tab, twice a week 06/27/14   Renato Shin, MD  ferrous sulfate 325 (65 FE) MG tablet Take 325 mg by mouth daily with breakfast.     Historical Provider, MD  fluorometholone (FML) 0.1 % ophthalmic suspension Place 1 drop into both eyes 2 (two) times daily. 03/14/13   Historical Provider, MD  levothyroxine (SYNTHROID, LEVOTHROID) 150 MCG tablet Take 150 mcg by mouth daily before breakfast.    Historical Provider, MD  levothyroxine (SYNTHROID, LEVOTHROID) 150 MCG tablet TAKE 1 TABLET BY MOUTH DAILY BEFORE BREAKFAST. 04/13/14   Renato Shin, MD  levothyroxine (SYNTHROID, LEVOTHROID) 150 MCG tablet TAKE 1 TABLET BY MOUTH DAILY BEFORE BREAKFAST. 04/23/14  Renato Shin, MD  levothyroxine (SYNTHROID, LEVOTHROID) 150 MCG tablet TAKE 1 TABLET BY MOUTH DAILY BEFORE BREAKFAST. 07/09/14   Renato Shin, MD  loperamide (IMODIUM) 2 MG capsule Take 2 capsules (4 mg total) by mouth every 4 (four) hours as needed for diarrhea or loose stools. 01/16/14   Renato Shin, MD  lovastatin (MEVACOR) 20 MG tablet TAKE 1 TABLET (20 MG TOTAL) BY MOUTH DAILY AT 6 PM. 04/23/14   Renato Shin, MD  magnesium oxide (MAG-OX) 400 MG tablet Take 400 mg by mouth 3 (three) times daily.    Historical Provider, MD  ondansetron (ZOFRAN) 4 MG tablet Take 1 tablet (4 mg total) by mouth every 8 (eight) hours as needed for nausea or vomiting. 01/08/14   Kristen N Ward, DO  oxyCODONE-acetaminophen (PERCOCET) 7.5-325 MG per tablet Take 1 tab every 4-6 hours as needed for pain. 06/26/14   Renato Shin, MD  pantoprazole (PROTONIX) 40 MG tablet Take 1 tablet 30 minutes before breakfast and supper 09/05/13   Ladene Artist, MD  pramoxine-hydrocortisone [facility]) cream Apply topically 2 (two) times daily. 05/02/14   Laban Emperor. Zehr, PA-C  ranitidine (ZANTAC) 300 MG capsule Take 300 mg by mouth every evening.    Historical Provider, MD  thiamine 100 MG tablet Take 100 mg by mouth daily.      Historical Provider, MD  UNABLE TO FIND Take 1 tablet by mouth every morning. Slow Mag- Magnesium Chloride    Historical Provider, MD  zolpidem (AMBIEN) 5 MG tablet TAKE 1 TABLET BY MOUTH AT  BEDTIME AS NEEDED SLEEP 03/27/14   Renato Shin, MD   BP 115/82 mmHg  Pulse 77  Temp(Src) 97.5 F (36.4 C) (Oral)  Resp 14  Ht 5\' 5"  (1.651 m)  Wt 190 lb (86.183 kg)  BMI 31.62 kg/m2  SpO2 100% Physical Exam  Constitutional: She appears well-developed and well-nourished. No distress.  HENT:  Head: Normocephalic and atraumatic.  Neck: Neck supple.  Cardiovascular: Normal rate and regular rhythm.   Pulmonary/Chest: Effort normal and breath sounds normal. No respiratory distress. She has no wheezes. She has no rales. She exhibits no tenderness.  Musculoskeletal:       Cervical back: She exhibits decreased range of motion, pain and spasm. She exhibits no tenderness, no bony tenderness, no swelling, no edema, no deformity, no laceration and normal pulse.       Back:  Spine nontender, no crepitus, or stepoffs.  Upper extremities:  Strength 5/5, sensation intact, distal pulses intact.     Neurological: She is alert.  Skin: She is not diaphoretic.  Nursing note and vitals reviewed.   ED Course  Procedures (including critical care time) Labs Review Labs Reviewed - No data to display  Imaging Review Dg Cervical Spine Complete  07/12/2014   CLINICAL DATA:  Acute right-sided neck pain without injury. Initial encounter.  EXAM: CERVICAL SPINE  4+ VIEWS  COMPARISON:  None.  FINDINGS: No fracture or spondylolisthesis is noted. Mild degenerative disc disease is noted at C4-5, C5-6 and C6-7 with anterior osteophyte formation. Mild bilateral neural foraminal stenosis is noted at C4-5, C5-6 and C6-7 secondary to uncovertebral spurring.  IMPRESSION: Multilevel degenerative disc disease is noted as well as mild bilateral neural foraminal stenosis seen in lower cervical spine as described above. No acute abnormality is noted in the cervical spine.   Electronically Signed   By: Marijo Conception, M.D.   On: 07/12/2014 07:59     EKG Interpretation None  Dr Johnney Killian made aware of the patient.      MDM   Final diagnoses:  Neck pain on right side  Muscle spasm    Afebrile, nontoxic patient with right sided neck pain with radiation into her shoulder.  On exam, pt has spasm and tenderness in right trapezius.  No injury.  Right arm without symptoms, neurovascularly intact.  Xray of c-spine shows multilevel DDD and mild foraminal stenosis.  Possible cervical radiculopathy though more likely muscle spasm.  Discussed pt with Dr Johnney Killian who also saw and examined the patient, agrees with diagnosis and recommends prednisone, norflex, and percocet for home with PCP follow up.   She has discussed this with patient.   D/C home.   Discussed findings, treatment, and follow up  with patient.  Pt given return precautions.  Pt verbalizes understanding and agrees with plan.         Clayton Bibles, PA-C 07/12/14 0935  Charlesetta Shanks, MD 07/19/14 [phone]

## 2014-07-12 NOTE — Discharge Instructions (Signed)
Read the information below.  Use the prescribed medication as directed.  Please discuss all new medications with your pharmacist.  Do not take additional tylenol while taking the prescribed pain medication to avoid overdose.  You may return to the Emergency Department at any time for worsening condition or any new symptoms that concern you.   If you develop fevers, uncontrolled pain, loss of control of bowel or bladder, weakness or numbness in your arms or legs, or are unable to walk, return to the ER for a recheck.     Muscle Cramps and Spasms Muscle cramps and spasms occur when a muscle or muscles tighten and you have no control over this tightening (involuntary muscle contraction). They are a common problem and can develop in any muscle. The most common place is in the calf muscles of the leg. Both muscle cramps and muscle spasms are involuntary muscle contractions, but they also have differences:   Muscle cramps are sporadic and painful. They may last a few seconds to a quarter of an hour. Muscle cramps are often more forceful and last longer than muscle spasms.  Muscle spasms may or may not be painful. They may also last just a few seconds or much longer. CAUSES  It is uncommon for cramps or spasms to be due to a serious underlying problem. In many cases, the cause of cramps or spasms is unknown. Some common causes are:   Overexertion.   Overuse from repetitive motions (doing the same thing over and over).   Remaining in a certain position for a long period of time.   Improper preparation, form, or technique while performing a sport or activity.   Dehydration.   Injury.   Side effects of some medicines.   Abnormally low levels of the salts and ions in your blood (electrolytes), especially potassium and calcium. This could happen if you are taking water pills (diuretics) or you are pregnant.  Some underlying medical problems can make it more likely to develop cramps or spasms.  These include, but are not limited to:   Diabetes.   Parkinson disease.   Hormone disorders, such as thyroid problems.   Alcohol abuse.   Diseases specific to muscles, joints, and bones.   Blood vessel disease where not enough blood is getting to the muscles.  HOME CARE INSTRUCTIONS   Stay well hydrated. Drink enough water and fluids to keep your urine clear or pale yellow.  It may be helpful to massage, stretch, and relax the affected muscle.  For tight or tense muscles, use a warm towel, heating pad, or hot shower water directed to the affected area.  If you are sore or have pain after a cramp or spasm, applying ice to the affected area may relieve discomfort.  Put ice in a plastic bag.  Place a towel between your skin and the bag.  Leave the ice on for 15-20 minutes, 03-04 times a day.  Medicines used to treat a known cause of cramps or spasms may help reduce their frequency or severity. Only take over-the-counter or prescription medicines as directed by your caregiver. SEEK MEDICAL CARE IF:  Your cramps or spasms get more severe, more frequent, or do not improve over time.  MAKE SURE YOU:   Understand these instructions.  Will watch your condition.  Will get help right away if you are not doing well or get worse. Document Released: 11/07/2001 Document Revised: 09/12/2012 Document Reviewed: 05/04/2012 [facility] Patient Information 2015 [place], [place]. This information is not  intended to replace advice given to you by your health care provider. Make sure you discuss any questions you have with your health care provider. ° °

## 2014-07-12 NOTE — ED Notes (Signed)
Patient transported to X-ray 

## 2014-07-24 ENCOUNTER — Telehealth: Payer: Self-pay | Admitting: Endocrinology

## 2014-07-24 MED ORDER — OXYCODONE-ACETAMINOPHEN 7.5-325 MG PO TABS: ORAL_TABLET | ORAL | Status: DC

## 2014-07-24 NOTE — Telephone Encounter (Signed)
Pt advised that rx is ready for pick. Rx placed up front.

## 2014-07-24 NOTE — Telephone Encounter (Signed)
See below. Oxycodone was last printed on 06/26/2014 when pt was here for a ov.  Thanks!

## 2014-07-24 NOTE — Telephone Encounter (Signed)
i printed 

## 2014-07-24 NOTE — Telephone Encounter (Signed)
Pt needs pain med refill  °

## 2014-07-31 ENCOUNTER — Ambulatory Visit: Payer: Self-pay

## 2014-08-01 ENCOUNTER — Other Ambulatory Visit: Payer: Self-pay | Admitting: Endocrinology

## 2014-08-01 DIAGNOSIS — Z853 Personal history of malignant neoplasm of breast: Secondary | ICD-10-CM

## 2014-08-09 ENCOUNTER — Ambulatory Visit (INDEPENDENT_AMBULATORY_CARE_PROVIDER_SITE_OTHER): Payer: Medicare Other | Admitting: *Deleted

## 2014-08-09 ENCOUNTER — Other Ambulatory Visit (INDEPENDENT_AMBULATORY_CARE_PROVIDER_SITE_OTHER): Payer: Medicare Other | Admitting: *Deleted

## 2014-08-09 DIAGNOSIS — E538 Deficiency of other specified B group vitamins: Secondary | ICD-10-CM

## 2014-08-09 MED ORDER — CYANOCOBALAMIN 1000 MCG/ML IJ SOLN
1000.0000 ug | Freq: Once | INTRAMUSCULAR | Status: AC
  Administered 2014-08-09: 1000 ug via INTRAMUSCULAR

## 2014-08-11 ENCOUNTER — Encounter (HOSPITAL_COMMUNITY): Payer: Self-pay | Admitting: *Deleted

## 2014-08-11 ENCOUNTER — Emergency Department (HOSPITAL_COMMUNITY)
Admission: EM | Admit: 2014-08-11 | Discharge: 2014-08-11 | Disposition: A | Discharge status: Home / Self Care | Payer: Medicare Other | Attending: Emergency Medicine | Admitting: Emergency Medicine

## 2014-08-11 DIAGNOSIS — I10 Essential (primary) hypertension: Secondary | ICD-10-CM | POA: Insufficient documentation

## 2014-08-11 DIAGNOSIS — Z79899 Other long term (current) drug therapy: Secondary | ICD-10-CM | POA: Diagnosis not present

## 2014-08-11 DIAGNOSIS — E039 Hypothyroidism, unspecified: Secondary | ICD-10-CM | POA: Insufficient documentation

## 2014-08-11 DIAGNOSIS — D649 Anemia, unspecified: Secondary | ICD-10-CM | POA: Insufficient documentation

## 2014-08-11 DIAGNOSIS — Z8711 Personal history of peptic ulcer disease: Secondary | ICD-10-CM | POA: Diagnosis not present

## 2014-08-11 DIAGNOSIS — L988 Other specified disorders of the skin and subcutaneous tissue: Secondary | ICD-10-CM | POA: Insufficient documentation

## 2014-08-11 DIAGNOSIS — E669 Obesity, unspecified: Secondary | ICD-10-CM | POA: Insufficient documentation

## 2014-08-11 DIAGNOSIS — K219 Gastro-esophageal reflux disease without esophagitis: Secondary | ICD-10-CM | POA: Insufficient documentation

## 2014-08-11 DIAGNOSIS — S30821A Blister (nonthermal) of abdominal wall, initial encounter: Secondary | ICD-10-CM | POA: Diagnosis not present

## 2014-08-11 DIAGNOSIS — E785 Hyperlipidemia, unspecified: Secondary | ICD-10-CM | POA: Insufficient documentation

## 2014-08-11 DIAGNOSIS — Z853 Personal history of malignant neoplasm of breast: Secondary | ICD-10-CM | POA: Diagnosis not present

## 2014-08-11 DIAGNOSIS — Z8619 Personal history of other infectious and parasitic diseases: Secondary | ICD-10-CM | POA: Diagnosis not present

## 2014-08-11 DIAGNOSIS — Z87440 Personal history of urinary (tract) infections: Secondary | ICD-10-CM | POA: Diagnosis not present

## 2014-08-11 DIAGNOSIS — Z87891 Personal history of nicotine dependence: Secondary | ICD-10-CM | POA: Insufficient documentation

## 2014-08-11 DIAGNOSIS — Z8739 Personal history of other diseases of the musculoskeletal system and connective tissue: Secondary | ICD-10-CM | POA: Diagnosis not present

## 2014-08-11 DIAGNOSIS — E119 Type 2 diabetes mellitus without complications: Secondary | ICD-10-CM | POA: Diagnosis not present

## 2014-08-11 DIAGNOSIS — Z87442 Personal history of urinary calculi: Secondary | ICD-10-CM | POA: Diagnosis not present

## 2014-08-11 NOTE — Discharge Instructions (Signed)
Return to the emergency room with worsening of symptoms, new symptoms or with symptoms that are concerning , especially fevers, more blisters, redness, tenderness to blister, abdominal pain in one area, unable to keep down fluids, blood in stool or vomit, severe pain, you feel faint, lightheaded or pass out. Follow up with primary care provider as needed.    Blisters Blisters are fluid-filled sacs that form within the skin. Common causes of blistering are friction, burns, and exposure to irritating chemicals. The fluid in the blister protects the underlying damaged skin. Most of the time it is not recommended that you open blisters. When a blister is opened, there is an increased chance for infection. Usually, a blister will open on its own. They then dry up and peel off within 10 days. If the blister is tense and uncomfortable (painful) the fluid may be drained. If it is drained the roof of the blister should be left intact. The draining should only be done by a medical professional under aseptic conditions. Poorly fitting shoes and boots can cause blisters by being too tight or too loose. Wearing extra socks or using tape, bandages, or pads over the blister-prone area helps prevent the problem by reducing friction. Blisters heal more slowly if you have diabetes or if you have problems with your circulation. You need to be careful about medical follow-up to prevent infection. HOME CARE INSTRUCTIONS  Protect areas where blisters have formed until the skin is healed. Use a special bandage with a hole cut in the middle around the blister. This reduces pressure and friction. When the blister breaks, trim off the loose skin and keep the area clean by washing it with soap daily. Soaking the blister or broken-open blister with diluted vinegar twice daily for 15 minutes will dry it up and speed the healing. Use 3 tablespoons of white vinegar per quart of water (45 mL white vinegar per liter of water). An antibiotic  ointment and a bandage can be used to cover the area after soaking.  SEEK MEDICAL CARE IF:   You develop increased redness, pain, swelling, or drainage in the blistered area.  You develop a pus-like discharge from the blistered area, chills, or a fever. MAKE SURE YOU:   Understand these instructions.  Will watch your condition.  Will get help right away if you are not doing well or get worse. Document Released: 06/25/2004 Document Revised: 08/10/2011 Document Reviewed: 05/23/2008 [facility] Patient Information 2015 [place], [place]. This information is not intended to replace advice given to you by your health care provider. Make sure you discuss any questions you have with your health care provider.

## 2014-08-11 NOTE — ED Provider Notes (Signed)
69 year old female awoke this morning with a vesicular lesion just left of her umbilicus, just left of her overlying laparotomy scar. She has no other symptoms except for mild nausea which she has intermittently, there are no other lesions, on exam she has approximately a 1-1/2 cm in diameter vesicular lesion with no surrounding erythema, no tenderness, clear heart and lung sounds and no other lesions including in the oral cavity. She has been given the instructions for return including looking for other lesions, surrounding erythema or systemic symptoms, she is agreeable to follow-up. Benign-appearing, reassuring exam. No new medications, no signs of Stevens-Johnson or erythema multiforme.  Medical screening examination/treatment/procedure(s) were conducted as a shared visit with non-physician practitioner(s) and myself.  I personally evaluated the patient during the encounter.  Clinical Impression:   Final diagnoses:  Blister of abdominal wall without infection, initial encounter         Noemi Chapel, MD 08/11/14 1557

## 2014-08-11 NOTE — ED Provider Notes (Signed)
CSN: [id number]     Arrival date & time 08/11/14  0609 History   First MD Initiated Contact with Patient 08/11/14 [phone]     Chief Complaint  Patient presents with  . Blister     (Consider location/radiation/quality/duration/timing/severity/associated sxs/prior Treatment) HPI  [name] is a 69 y.o. female with PMH of PUD, short bowel syndrome, internal hemorrhoids, osteoarthritis, diverticulosis, gastroparesis, presenting with one-day history of blister to her abdomen. No redness, swelling, tenderness to the blister. She denies any fevers or chills. No cough congestion or shortness of breath chest pain. Patient denies any other blisters. She denies any new medications. No new changes in her diet. She endorses nausea but she states this is chronic and she has intermittently. She denies any vomiting or changes in her stool. She denies abdominal pain. No new take bites or insect bites. She denies using any new lotions or detergents. No new clothes.   Past Medical History  Diagnosis Date  . Obesity   . PUD (peptic ulcer disease)   . Leukopenia   . Short bowel syndrome   . Internal hemorrhoids without mention of complication   . Stricture and stenosis of esophagus   . Osteoarthrosis, unspecified whether generalized or localized, unspecified site   . Thyrotoxicosis without mention of goiter or other cause, without mention of thyrotoxic crisis or storm   . Other and unspecified hyperlipidemia   . Gout, unspecified   . Diverticulosis of colon (without mention of hemorrhage)   . C. difficile colitis   . VITAMIN B12 DEFICIENCY 08/30/2009  . GOITER, MULTINODULAR 04/02/2009  . HYPOTHYROIDISM, POST-RADIATION 08/13/2009  . ASYMPTOMATIC POSTMENOPAUSAL STATUS 10/11/2008  . Esophageal reflux 06/12/2008  . PONV (postoperative nausea and vomiting)   . Varicose veins   . Blood transfusion   . UTI (urinary tract infection)   . Kidney stones     "several"  . Unspecified essential hypertension    . Angina   . Shortness of breath on exertion     "sometimes"  . ANEMIA, IRON DEFICIENCY 05/08/2009  . History of lower GI bleeding   . H/O hiatal hernia   . Migraines   . Breast cancer 09/29/11    invasive grade III ductal ca,assoc high grade dcis,ER/PR=neg  . History of radiation therapy 02/08/12-03/25/12    left breast,total 61gy  . Blood transfusion without reported diagnosis   . Hypomagnesemia   . Hypokalemia 05/11/2013  . Gastroparesis   . Type II or unspecified type diabetes mellitus without mention of complication, not stated as uncontrolled     no med in years diet controled   Past Surgical History  Procedure Laterality Date  . Vein ligation and stripping  1980's    Right leg  . Lithotripsy      "4 or 5 times"  . Bunionectomy  1970's    bilateral  . Colonoscopy  2012    multiple   . Esophagogastroduodenoscopy  2011    multiple   . Thyroid ultrasound  12/1994 and 12/1995  . Mastectomy w/ nodes partial  09/29/11    left  . Cholecystectomy  1990's  . Abdominal hysterectomy  1970's    with BSO  . Dilation and curettage of uterus    . Colon surgery      "several surgeries for short bowel syndrome"  . Abdominal adhesion surgery  1980's thru 1990's    "several"  . Kidney stone surgery  1990's    "tried to go up & get it but pushed  it further up"  . Portacath placement  09/29/2011    Procedure: INSERTION PORT-A-CATH;  Surgeon: Adin Hector, MD;  Location: [facility];  Service: General;  Laterality: N/A;  . Breast biopsy  08/13/11    left breast lower inner quadrant  . Breast lumpectomy w/ needle localization  09/29/11    left  breast=lymph node,excision benign/ ER/PR=neg, her 2 Positive  . Flexible sigmoidoscopy  2011    multiple   . Port-a-cath removal Right 12/19/2013    Procedure: MINOR REMOVAL PORT-A-CATH;  Surgeon: Adin Hector, MD;  Location: [facility];  Service: General;  Laterality: Right;   Family History  Problem Relation Age of Onset  .  Esophageal cancer Son     deceased  . Diabetes Mother   . Heart disease Mother   . Colon cancer Paternal Uncle   . Kidney disease Sister   . Diabetes Father   . Hypertension Father   . Kidney disease Brother     x 3   History  Substance Use Topics  . Smoking status: Former Smoker -- 1.00 packs/day for 10 years    Types: Cigarettes    Quit date: 09/22/1985  . Smokeless tobacco: Never Used  . Alcohol Use: No     Comment: 09/29/11 "used to drink socially years ago"   OB History    No data available     Review of Systems  Constitutional: Negative for fever and chills.  HENT: Negative for congestion, rhinorrhea and sore throat.   Eyes: Negative for visual disturbance.  Respiratory: Negative for cough and shortness of breath.   Cardiovascular: Negative for chest pain.  Gastrointestinal: Positive for nausea. Negative for vomiting and diarrhea.  Skin:       Blister      Allergies  Aspirin; Codeine; Trazodone and nefazodone; Flagyl; Iodine; and Morphine and related  Home Medications   Prior to Admission medications   Medication Sig Start Date End Date Taking? Authorizing Provider  aMILoride (MIDAMOR) 5 MG tablet Take 5 mg by mouth 2 (two) times daily.  06/11/14  Yes Historical Provider, MD  calcium carbonate (TUMS - DOSED IN MG ELEMENTAL CALCIUM) 500 MG chewable tablet Chew 1 tablet by mouth as needed for indigestion or heartburn.   Yes Historical Provider, MD  Calcium Carbonate-Vitamin D (CALCIUM-VITAMIN D) 600-200 MG-UNIT CAPS Take 1 capsule by mouth daily.     Yes Historical Provider, MD  calcium gluconate 650 MG tablet Take 1 tablet (650 mg total) by mouth daily. 01/14/12 08/11/14 Yes Consuela Mimes, MD  cyanocobalamin (,VITAMIN B-12,) 1000 MCG/ML injection Inject 1,000 mcg into the muscle every 30 (thirty) days. Next one due the 26th of this month   Yes Historical Provider, MD  desipramine (NOPRAMIN) 10 MG tablet Take 1 tablet (10 mg total) by mouth at bedtime. 04/23/14  Yes  Renato Shin, MD  diltiazem 2 % GEL Apply 1 application topically 2 (two) times daily. 05/02/14  Yes Jessica D Zehr, PA-C  diphenoxylate-atropine (LOMOTIL) 2.5-0.025 MG per tablet Take 2-3 times daily as needed for diarrhea. 02/14/14  Yes Willia Craze, NP  ergocalciferol (VITAMIN D2) 50000 UNITS capsule 1 tab, twice a week Patient taking differently: Take 50,000 Units by mouth 2 (two) times a week. Sunday and Monday 06/27/14  Yes Renato Shin, MD  ferrous sulfate 325 (65 FE) MG tablet Take 325 mg by mouth daily with breakfast.   Yes Historical Provider, MD  fluorometholone (FML) 0.1 % ophthalmic suspension Place 1 drop into  both eyes 2 (two) times daily. 03/14/13  Yes Historical Provider, MD  levothyroxine (SYNTHROID, LEVOTHROID) 150 MCG tablet TAKE 1 TABLET BY MOUTH DAILY BEFORE BREAKFAST. 04/13/14  Yes Renato Shin, MD  loperamide (IMODIUM) 2 MG capsule Take 2 capsules (4 mg total) by mouth every 4 (four) hours as needed for diarrhea or loose stools. 01/16/14  Yes Renato Shin, MD  lovastatin (MEVACOR) 20 MG tablet TAKE 1 TABLET (20 MG TOTAL) BY MOUTH DAILY AT 6 PM. 04/23/14  Yes Renato Shin, MD  magnesium oxide (MAG-OX) 400 MG tablet Take 800 mg by mouth 3 (three) times daily.    Yes Historical Provider, MD  ondansetron (ZOFRAN) 4 MG tablet Take 1 tablet (4 mg total) by mouth every 8 (eight) hours as needed for nausea or vomiting. 01/08/14  Yes Kristen N Ward, DO  orphenadrine (NORFLEX) 100 MG tablet Take 1 tablet (100 mg total) by mouth 2 (two) times daily as needed for muscle spasms. 07/12/14  Yes Clayton Bibles, PA-C  oxyCODONE-acetaminophen (PERCOCET) 7.5-325 MG per tablet Take 1 tab every 4-6 hours as needed for pain. 07/24/14  Yes Renato Shin, MD  pantoprazole (PROTONIX) 40 MG tablet Take 1 tablet 30 minutes before breakfast and supper 09/05/13  Yes Ladene Artist, MD  pramoxine-hydrocortisone [facility]) cream Apply topically 2 (two) times daily. 05/02/14  Yes Jessica D Zehr, PA-C  ranitidine  (ZANTAC) 300 MG capsule Take 300 mg by mouth every evening.   Yes Historical Provider, MD  thiamine 100 MG tablet Take 100 mg by mouth daily.     Yes Historical Provider, MD  zolpidem (AMBIEN) 5 MG tablet TAKE 1 TABLET BY MOUTH AT BEDTIME AS NEEDED SLEEP 03/27/14  Yes Renato Shin, MD  levothyroxine (SYNTHROID, LEVOTHROID) 150 MCG tablet TAKE 1 TABLET BY MOUTH DAILY BEFORE BREAKFAST. Patient not taking: Reported on 07/12/2014 04/23/14   Renato Shin, MD  levothyroxine (SYNTHROID, LEVOTHROID) 150 MCG tablet TAKE 1 TABLET BY MOUTH DAILY BEFORE BREAKFAST. Patient not taking: Reported on 07/12/2014 07/09/14   Renato Shin, MD  predniSONE (DELTASONE) 20 MG tablet Take 2 pills PO x 5 days Patient not taking: Reported on 08/11/2014 07/12/14   Clayton Bibles, PA-C   BP 98/66 mmHg  Pulse 70  Temp(Src) 97.9 F (36.6 C) (Oral)  Resp 18  Ht 5\' 5"  (1.651 m)  Wt 190 lb (86.183 kg)  BMI 31.62 kg/m2  SpO2 100% Physical Exam  Constitutional: She appears well-developed and well-nourished. No distress.  HENT:  Head: Normocephalic and atraumatic.  Mouth/Throat: Oropharynx is clear and moist.  No oropharynx petechiae or lesions.  Eyes: Conjunctivae and EOM are normal. Right eye exhibits no discharge. Left eye exhibits no discharge.  Cardiovascular: Normal rate and regular rhythm.   Pulmonary/Chest: Effort normal and breath sounds normal. No respiratory distress. She has no wheezes.  Abdominal: Soft. Bowel sounds are normal. She exhibits no distension. There is no tenderness.  Neurological: She is alert. She exhibits normal muscle tone. Coordination normal.  Skin: Skin is warm and dry. She is not diaphoretic.  1 cm blister left and superior to umbilicus.  Nursing note and vitals reviewed.   ED Course  Procedures (including critical care time) Labs Review Labs Reviewed - No data to display  Imaging Review No results found.   EKG Interpretation None      MDM   Final diagnoses:  Blister of abdominal  wall without infection, initial encounter   Patient presenting with blister to abdomen without erythema, tenderness. She denies any new  medications and denies blisters elsewhere. No shortness of breath or chest pain. Patient with chronic nausea and "intestinal problems". She states she has no new symptoms and does not want to have this worked up today. VSS. One isolated blister to left abdomen but is not erythematous or tender. No oropharynx lesions. I doubt Stevens-Johnson syndrome or systemic reaction. We'll treat conservatively and patient to follow-up with PCP.  Discussed return precautions with patient. Discussed all results and patient verbalizes understanding and agrees with plan.  This is a shared patient. This patient was discussed with the physician who saw and evaluated the patient and agrees with the plan.   Al Corpus, PA-C 08/11/14 [place], MD 08/11/14 1556

## 2014-08-11 NOTE — ED Notes (Signed)
The pt has a blister on her abd.  She found it when she got up to go to the br this am.  She was havuing abd pain.  The blister  Is not red and it does not hurt

## 2014-08-16 ENCOUNTER — Other Ambulatory Visit: Payer: Self-pay | Admitting: *Deleted

## 2014-08-16 ENCOUNTER — Ambulatory Visit (HOSPITAL_BASED_OUTPATIENT_CLINIC_OR_DEPARTMENT_OTHER): Payer: Medicare Other | Admitting: Hematology and Oncology

## 2014-08-16 ENCOUNTER — Telehealth: Payer: Self-pay | Admitting: Hematology and Oncology

## 2014-08-16 VITALS — BP 127/79 | HR 72 | Temp 98.0°F | Resp 18 | Ht 65.0 in | Wt 199.7 lb

## 2014-08-16 DIAGNOSIS — C50319 Malignant neoplasm of lower-inner quadrant of unspecified female breast: Secondary | ICD-10-CM

## 2014-08-16 DIAGNOSIS — Z853 Personal history of malignant neoplasm of breast: Secondary | ICD-10-CM

## 2014-08-16 DIAGNOSIS — R591 Generalized enlarged lymph nodes: Secondary | ICD-10-CM | POA: Diagnosis not present

## 2014-08-16 DIAGNOSIS — C50312 Malignant neoplasm of lower-inner quadrant of left female breast: Secondary | ICD-10-CM

## 2014-08-16 MED ORDER — MAGIC MOUTHWASH W/LIDOCAINE: 5.0000 mL | Freq: Four times a day (QID) | ORAL | Status: DC

## 2014-08-16 MED ORDER — MAGIC MOUTHWASH W/LIDOCAINE
5.0000 mL | Freq: Four times a day (QID) | ORAL | Status: DC
Start: 2014-08-16 — End: 2014-11-01

## 2014-08-16 MED ORDER — ONDANSETRON HCL 4 MG PO TABS: 4.0000 mg | ORAL_TABLET | Freq: Three times a day (TID) | ORAL | Status: DC | PRN

## 2014-08-16 NOTE — Progress Notes (Signed)
Error in documentation above: 1. Patient does not have lymphedema in the pectoral area and the right arm. Please note this in the history as well as the physical examination as well as an assessment and plan. 2. We will not be getting home health for PT OT for her.

## 2014-08-16 NOTE — Progress Notes (Signed)
Patient Care Team: Renato Shin, MD as PCP - General Sable Feil, MD as Attending Physician (Gastroenterology) Consuela Mimes, MD (Hematology and Oncology) Fanny Skates, MD as Attending Physician (General Surgery) Jesusita Oka, RN as Registered Nurse Amada Kingfisher, MD as Consulting Physician (Hematology and Oncology)  DIAGNOSIS: Cancer of lower-inner quadrant of female breast   Staging form: Breast, AJCC 7th Edition     Clinical: Stage IA (T1c, N0, cM0) - Unsigned       Staging comments: Staged In Breast Conference      Pathologic: No stage assigned - Unsigned  PRIOR THERAPY: 1. 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 ER negative PR negative Her2 Neu positive with Ki -67 53%,   2. S/P porta cath placement for chemotherapy  #3 patient has begun her adjuvant chemotherapy consisting of Taxotere carboplatinum and Herceptin. Her treatment began in May 2013. A total of 4 cycles of Taxotere carboplatinum and Herceptin combination are planned. Once she completes this she will then proceed to radiation therapy with concomitant Herceptin to be given every 3 weeks to finish out a year of treatment.  #4 patient received 4 cycles of Taxotere carboplatinum and Herceptin between 10/30/2011 to 11/2011.  #5 Radiation therapy from 02/08/12 to 03/25/12.  #6 she then began Herceptin every 3 weeks starting on 01/20/2012. This finished on May 22.2014.   CURRENT THERAPY: Observation  CHIEF COMPLIANT: Follow up of breast cancer  INTERVAL HISTORY: [name] is a 69 yr old with H/O breast cancer diagnosed in 2013 and treated with mastectomy followed by adjuvant chemotherapy followed by Herceptin maintenance and radiation. She is currently in observation and is doing very well without any major problems or concerns. Her major complaint is the lymphedema that started in the right arm including the right pectoral area.  There is significant prominence of soft tissue swelling there. She would like to get physical therapy to assist her.  REVIEW OF SYSTEMS:   Constitutional: Denies fevers, chills or abnormal weight loss Eyes: Denies blurriness of vision Ears, nose, mouth, throat, and face: Denies mucositis or sore throat Respiratory: Denies cough, dyspnea or wheezes Cardiovascular: Denies palpitation, chest discomfort or lower extremity swelling Gastrointestinal:  Denies nausea, heartburn or change in bowel habits Skin: Denies abnormal skin rashes Lymphatics: Lymphedema in the right arm and pectoral area Neurological:Denies numbness, tingling or new weaknesses Behavioral/Psych: Mood is stable, no new changes  Breast:  denies any pain or lumps or nodules in either breasts All other systems were reviewed with the patient and are negative.  I have reviewed the past medical history, past surgical history, social history and family history with the patient and they are unchanged from previous note.  ALLERGIES:  is allergic to aspirin; codeine; trazodone and nefazodone; flagyl; iodine; and morphine and related.  MEDICATIONS:  Current Outpatient Prescriptions  Medication Sig Dispense Refill  . aMILoride (MIDAMOR) 5 MG tablet Take 5 mg by mouth 2 (two) times daily.   11  . calcium carbonate (TUMS - DOSED IN MG ELEMENTAL CALCIUM) 500 MG chewable tablet Chew 1 tablet by mouth as needed for indigestion or heartburn.    . Calcium Carbonate-Vitamin D (CALCIUM-VITAMIN D) 600-200 MG-UNIT CAPS Take 1 capsule by mouth daily.      . cyanocobalamin (,VITAMIN B-12,) 1000 MCG/ML injection Inject 1,000 mcg into the muscle every 30 (thirty) days. Next one due the 26th of this month    .  desipramine (NOPRAMIN) 10 MG tablet Take 1 tablet (10 mg total) by mouth at bedtime. 90 tablet 1  . diltiazem 2 % GEL Apply 1 application topically 2 (two) times daily. 30 g 0  . diphenhydrAMINE (BENADRYL) 50 MG capsule   0  .  diphenoxylate-atropine (LOMOTIL) 2.5-0.025 MG per tablet Take 2-3 times daily as needed for diarrhea. 30 tablet 0  . ergocalciferol (VITAMIN D2) 50000 UNITS capsule 1 tab, twice a week (Patient taking differently: Take 50,000 Units by mouth 2 (two) times a week. Sunday and Monday) 10 capsule 5  . ferrous sulfate 325 (65 FE) MG tablet Take 325 mg by mouth daily with breakfast.    . fluorometholone (FML) 0.1 % ophthalmic suspension Place 1 drop into both eyes 2 (two) times daily.    Marland Kitchen HYDROcodone-acetaminophen (NORCO/VICODIN) 5-325 MG per tablet   0  . levothyroxine (SYNTHROID, LEVOTHROID) 150 MCG tablet TAKE 1 TABLET BY MOUTH DAILY BEFORE BREAKFAST. 30 tablet 0  . levothyroxine (SYNTHROID, LEVOTHROID) 150 MCG tablet TAKE 1 TABLET BY MOUTH DAILY BEFORE BREAKFAST. 90 tablet 1  . levothyroxine (SYNTHROID, LEVOTHROID) 150 MCG tablet TAKE 1 TABLET BY MOUTH DAILY BEFORE BREAKFAST. 30 tablet 0  . loperamide (IMODIUM) 2 MG capsule Take 2 capsules (4 mg total) by mouth every 4 (four) hours as needed for diarrhea or loose stools. 30 capsule 0  . lovastatin (MEVACOR) 20 MG tablet TAKE 1 TABLET (20 MG TOTAL) BY MOUTH DAILY AT 6 PM. 90 tablet 1  . magnesium oxide (MAG-OX) 400 (241.3 MG) MG tablet   11  . magnesium oxide (MAG-OX) 400 MG tablet Take 800 mg by mouth 3 (three) times daily.     . Magnesium Oxide 400 (240 MG) MG TABS   11  . orphenadrine (NORFLEX) 100 MG tablet Take 1 tablet (100 mg total) by mouth 2 (two) times daily as needed for muscle spasms. 20 tablet 0  . oxyCODONE-acetaminophen (PERCOCET) 7.5-325 MG per tablet Take 1 tab every 4-6 hours as needed for pain. 50 tablet 0  . pantoprazole (PROTONIX) 40 MG tablet Take 1 tablet 30 minutes before breakfast and supper 60 tablet 11  . pramoxine-hydrocortisone (ANALPRAM HC) cream Apply topically 2 (two) times daily. 30 g 0  . predniSONE (DELTASONE) 20 MG tablet Take 2 pills PO x 5 days 10 tablet 0  . predniSONE (DELTASONE) 50 MG tablet   0  . ranitidine  (ZANTAC) 300 MG capsule Take 300 mg by mouth every evening.    . thiamine 100 MG tablet Take 100 mg by mouth daily.      Marland Kitchen zolpidem (AMBIEN) 5 MG tablet TAKE 1 TABLET BY MOUTH AT BEDTIME AS NEEDED SLEEP 30 tablet 5  . Alum & Mag Hydroxide-Simeth (MAGIC MOUTHWASH W/LIDOCAINE) SOLN Take 5 mLs by mouth 4 (four) times daily. 1 part viscous lidocaine 2%, 1 part Maalox, 1 part diphenhydramine 12.5 mg per 5 ml elixir  Swish,gargle and spit one to two teaspoonfuls every six hours as needed 240 mL 0  . calcium gluconate 650 MG tablet Take 1 tablet (650 mg total) by mouth daily. 30 tablet 2  . ondansetron (ZOFRAN) 4 MG tablet Take 1 tablet (4 mg total) by mouth every 8 (eight) hours as needed for nausea or vomiting. 20 tablet 0   No current facility-administered medications for this visit.   Facility-Administered Medications Ordered in Other Visits  Medication Dose Route Frequency Provider Last Rate Last Dose  . acetaminophen (TYLENOL) tablet 1,000 mg  1,000 mg Oral Once  Amada Kingfisher, MD   1,000 mg at 08/09/13 1658    PHYSICAL EXAMINATION: ECOG PERFORMANCE STATUS: 1 - Symptomatic but completely ambulatory  Filed Vitals:   08/16/14 0936  BP: 127/79  Pulse: 72  Temp: 98 F (36.7 C)  Resp: 18   Filed Weights   08/16/14 0936  Weight: 199 lb 11.2 oz (90.583 kg)    GENERAL:alert, no distress and comfortable SKIN: skin color, texture, turgor are normal, no rashes or significant lesions EYES: normal, Conjunctiva are pink and non-injected, sclera clear OROPHARYNX:no exudate, no erythema and lips, buccal mucosa, and tongue normal  NECK: supple, thyroid normal size, non-tender, without nodularity LYMPH:  no palpable lymphadenopathy in the cervical, axillary or inguinal LUNGS: clear to auscultation and percussion with normal breathing effort HEART: regular rate & rhythm and no murmurs and no lower extremity edema ABDOMEN:abdomen soft, non-tender and normal bowel sounds Musculoskeletal:no cyanosis of  digits and no clubbing  NEURO: alert & oriented x 3 with fluent speech, no focal motor/sensory deficits BREAST: No palpable masses or nodules in either right chest wall or left breasts. Prominent lymphedema and swelling in the upper part of the right arm. No palpable axillary supraclavicular or infraclavicular adenopathy no breast tenderness or nipple discharge. (exam performed in the presence of a chaperone)  LABORATORY DATA:  I have reviewed the data as listed   Chemistry      Component Value Date/Time   NA 140 06/26/2014 1135   NA 143 02/13/2014 0902   K 4.2 06/26/2014 1135   K 3.4* 02/13/2014 0902   CL 105 06/26/2014 1135   CL 107 10/20/2012 1102   CO2 27 06/26/2014 1135   CO2 25 02/13/2014 0902   BUN 21 06/26/2014 1135   BUN 9.9 02/13/2014 0902   CREATININE 0.89 06/26/2014 1135   CREATININE 1.0 02/13/2014 0902   CREATININE 0.88 03/31/2011 1550      Component Value Date/Time   CALCIUM 9.3 06/26/2014 1135   CALCIUM 9.6 06/26/2014 1135   CALCIUM 9.1 02/13/2014 0902   CALCIUM 9.5 05/15/2010 2153   ALKPHOS 69 02/13/2014 0902   ALKPHOS 73 01/08/2014 1150   AST 20 02/13/2014 0902   AST 21 01/08/2014 1150   ALT 17 02/13/2014 0902   ALT 14 01/08/2014 1150   BILITOT 0.78 02/13/2014 0902   BILITOT 0.9 01/08/2014 1150       Lab Results  Component Value Date   WBC 3.1* 04/20/2014   HGB 11.3* 04/20/2014   HCT 34.7* 04/20/2014   MCV 91.1 04/20/2014   PLT 198.0 04/20/2014   NEUTROABS 1.5 04/20/2014     RADIOGRAPHIC STUDIES: I have personally reviewed the radiology reports and agreed with their findings.   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 Currently on  observation  Breast cancer surveillance: 1. Breast and chest wall exam 08/16/2014 is normal 2. Mammogram 10/03/2013 is normal 3. Bone density test September 2015 showed a T score of -0.7 normal range  Severe lymphedema of the right arm and upper chest wall: We will consult physical therapy for home PT OT.  Return to clinic in 1 year for follow-up  No orders of the defined types were placed in this encounter.   The patient has a good understanding  of the overall plan. she agrees with it. She will call with any problems that may develop before her next visit here.   Rulon Eisenmenger, MD

## 2014-08-16 NOTE — Telephone Encounter (Signed)
Appointments made and avs printed for patient °

## 2014-08-27 ENCOUNTER — Telehealth: Payer: Self-pay

## 2014-08-27 ENCOUNTER — Telehealth: Payer: Self-pay | Admitting: Endocrinology

## 2014-08-27 MED ORDER — OXYCODONE-ACETAMINOPHEN 7.5-325 MG PO TABS: ORAL_TABLET | ORAL | Status: DC

## 2014-08-27 NOTE — Telephone Encounter (Signed)
Patient is calling to see if the prescription is ready

## 2014-08-27 NOTE — Telephone Encounter (Signed)
OK 

## 2014-08-27 NOTE — Telephone Encounter (Signed)
Pt called requesting a refill on her Oxycodone. Rx was last refilled on 07/24/2014 and pt was last seen on 06/26/2014.  Please advise if ok to refill medication during Dr. Cordelia Pen absence. Thanks!

## 2014-08-27 NOTE — Telephone Encounter (Signed)
Pt advised that rx is ready for pick up. Rx placed up front.  

## 2014-08-27 NOTE — Telephone Encounter (Signed)
error 

## 2014-09-03 DIAGNOSIS — K912 Postsurgical malabsorption, not elsewhere classified: Secondary | ICD-10-CM | POA: Diagnosis not present

## 2014-09-03 DIAGNOSIS — I10 Essential (primary) hypertension: Secondary | ICD-10-CM | POA: Diagnosis not present

## 2014-09-11 ENCOUNTER — Ambulatory Visit (INDEPENDENT_AMBULATORY_CARE_PROVIDER_SITE_OTHER): Payer: Medicare Other

## 2014-09-11 ENCOUNTER — Other Ambulatory Visit: Payer: Self-pay | Admitting: Endocrinology

## 2014-09-11 ENCOUNTER — Other Ambulatory Visit: Payer: Self-pay | Admitting: Gastroenterology

## 2014-09-11 DIAGNOSIS — E538 Deficiency of other specified B group vitamins: Secondary | ICD-10-CM | POA: Diagnosis not present

## 2014-09-11 MED ORDER — CYANOCOBALAMIN 1000 MCG/ML IJ SOLN
1000.0000 ug | Freq: Once | INTRAMUSCULAR | Status: AC
  Administered 2014-09-11: 1000 ug via INTRAMUSCULAR

## 2014-09-14 ENCOUNTER — Other Ambulatory Visit: Payer: Self-pay | Admitting: Endocrinology

## 2014-09-24 ENCOUNTER — Ambulatory Visit (INDEPENDENT_AMBULATORY_CARE_PROVIDER_SITE_OTHER): Payer: Medicare Other | Admitting: Endocrinology

## 2014-09-24 ENCOUNTER — Other Ambulatory Visit: Payer: Self-pay | Admitting: Gastroenterology

## 2014-09-24 ENCOUNTER — Encounter: Payer: Self-pay | Admitting: Endocrinology

## 2014-09-24 VITALS — HR 97 | Temp 97.7°F | Ht 65.0 in | Wt 200.0 lb

## 2014-09-24 DIAGNOSIS — M25519 Pain in unspecified shoulder: Secondary | ICD-10-CM | POA: Insufficient documentation

## 2014-09-24 DIAGNOSIS — M25511 Pain in right shoulder: Secondary | ICD-10-CM

## 2014-09-24 MED ORDER — OXYCODONE-ACETAMINOPHEN 7.5-325 MG PO TABS: ORAL_TABLET | ORAL | Status: DC

## 2014-09-24 NOTE — Progress Notes (Signed)
Subjective:    Patient ID: [name], female    DOB: 24-Dec-1945, 69 y.o.   MRN: [id number]  HPI Pt states few years of moderate pain at the right shoulder, but no assoc numbness.  No injury.   Past Medical History  Diagnosis Date  . Obesity   . PUD (peptic ulcer disease)   . Leukopenia   . Short bowel syndrome   . Internal hemorrhoids without mention of complication   . Stricture and stenosis of esophagus   . Osteoarthrosis, unspecified whether generalized or localized, unspecified site   . Thyrotoxicosis without mention of goiter or other cause, without mention of thyrotoxic crisis or storm   . Other and unspecified hyperlipidemia   . Gout, unspecified   . Diverticulosis of colon (without mention of hemorrhage)   . C. difficile colitis   . VITAMIN B12 DEFICIENCY 08/30/2009  . GOITER, MULTINODULAR 04/02/2009  . HYPOTHYROIDISM, POST-RADIATION 08/13/2009  . ASYMPTOMATIC POSTMENOPAUSAL STATUS 10/11/2008  . Esophageal reflux 06/12/2008  . PONV (postoperative nausea and vomiting)   . Varicose veins   . Blood transfusion   . UTI (urinary tract infection)   . Kidney stones     "several"  . Unspecified essential hypertension   . Angina   . Shortness of breath on exertion     "sometimes"  . ANEMIA, IRON DEFICIENCY 05/08/2009  . History of lower GI bleeding   . H/O hiatal hernia   . Migraines   . Breast cancer 09/29/11    invasive grade III ductal ca,assoc high grade dcis,ER/PR=neg  . History of radiation therapy 02/08/12-03/25/12    left breast,total 61gy  . Blood transfusion without reported diagnosis   . Hypomagnesemia   . Hypokalemia 05/11/2013  . Gastroparesis   . Type II or unspecified type diabetes mellitus without mention of complication, not stated as uncontrolled     no med in years diet controled    Past Surgical History  Procedure Laterality Date  . Vein ligation and stripping  1980's    Right leg  . Lithotripsy      "4 or 5 times"  . Bunionectomy  1970's      bilateral  . Colonoscopy  2012    multiple   . Esophagogastroduodenoscopy  2011    multiple   . Thyroid ultrasound  12/1994 and 12/1995  . Mastectomy w/ nodes partial  09/29/11    left  . Cholecystectomy  1990's  . Abdominal hysterectomy  1970's    with BSO  . Dilation and curettage of uterus    . Colon surgery      "several surgeries for short bowel syndrome"  . Abdominal adhesion surgery  1980's thru 1990's    "several"  . Kidney stone surgery  1990's    "tried to go up & get it but pushed it further up"  . Portacath placement  09/29/2011    Procedure: INSERTION PORT-A-CATH;  Surgeon: Adin Hector, MD;  Location: [facility];  Service: General;  Laterality: N/A;  . Breast biopsy  08/13/11    left breast lower inner quadrant  . Breast lumpectomy w/ needle localization  09/29/11    left  breast=lymph node,excision benign/ ER/PR=neg, her 2 Positive  . Flexible sigmoidoscopy  2011    multiple   . Port-a-cath removal Right 12/19/2013    Procedure: MINOR REMOVAL PORT-A-CATH;  Surgeon: Adin Hector, MD;  Location: [facility];  Service: General;  Laterality: Right;    History  Social History  . Marital Status: Married    Spouse Name: N/A  . Number of Children: 2  . Years of Education: N/A   Occupational History  . Not on file.   Social History Main Topics  . Smoking status: Former Smoker -- 1.00 packs/day for 10 years    Types: Cigarettes    Quit date: 09/22/1985  . Smokeless tobacco: Never Used  . Alcohol Use: No     Comment: 09/29/11 "used to drink socially years ago"  . Drug Use: No  . Sexual Activity: No     Comment: HRT x many yrs   Other Topics Concern  . Not on file   Social History Narrative   Pt gets regular exercise    Current Outpatient Prescriptions on File Prior to Visit  Medication Sig Dispense Refill  . Alum & Mag Hydroxide-Simeth (MAGIC MOUTHWASH W/LIDOCAINE) SOLN Take 5 mLs by mouth 4 (four) times daily. 1 part viscous lidocaine  2%, 1 part Maalox, 1 part diphenhydramine 12.5 mg per 5 ml elixir  Swish,gargle and spit one to two teaspoonfuls every six hours as needed 240 mL 0  . aMILoride (MIDAMOR) 5 MG tablet Take 5 mg by mouth 2 (two) times daily.   11  . calcium carbonate (TUMS - DOSED IN MG ELEMENTAL CALCIUM) 500 MG chewable tablet Chew 1 tablet by mouth as needed for indigestion or heartburn.    . Calcium Carbonate-Vitamin D (CALCIUM-VITAMIN D) 600-200 MG-UNIT CAPS Take 1 capsule by mouth daily.      . cyanocobalamin (,VITAMIN B-12,) 1000 MCG/ML injection Inject 1,000 mcg into the muscle every 30 (thirty) days. Next one due the 26th of this month    . desipramine (NOPRAMIN) 10 MG tablet Take 1 tablet (10 mg total) by mouth at bedtime. 90 tablet 1  . diltiazem 2 % GEL Apply 1 application topically 2 (two) times daily. 30 g 0  . diphenhydrAMINE (BENADRYL) 50 MG capsule   0  . diphenoxylate-atropine (LOMOTIL) 2.5-0.025 MG per tablet Take 2-3 times daily as needed for diarrhea. 30 tablet 0  . ergocalciferol (VITAMIN D2) 50000 UNITS capsule 1 tab, twice a week (Patient taking differently: Take 50,000 Units by mouth 2 (two) times a week. Sunday and Monday) 10 capsule 5  . ferrous sulfate 325 (65 FE) MG tablet Take 325 mg by mouth daily with breakfast.    . fluorometholone (FML) 0.1 % ophthalmic suspension Place 1 drop into both eyes 2 (two) times daily.    Marland Kitchen levothyroxine (SYNTHROID, LEVOTHROID) 150 MCG tablet TAKE 1 TABLET BY MOUTH DAILY BEFORE BREAKFAST. 30 tablet 0  . loperamide (IMODIUM) 2 MG capsule Take 2 capsules (4 mg total) by mouth every 4 (four) hours as needed for diarrhea or loose stools. 30 capsule 0  . lovastatin (MEVACOR) 20 MG tablet TAKE 1 TABLET (20 MG TOTAL) BY MOUTH DAILY AT 6 PM. 90 tablet 1  . magnesium oxide (MAG-OX) 400 (241.3 MG) MG tablet   11  . magnesium oxide (MAG-OX) 400 MG tablet Take 40 mg by mouth 3 (three) times daily.     . ondansetron (ZOFRAN) 4 MG tablet Take 1 tablet (4 mg total) by mouth  every 8 (eight) hours as needed for nausea or vomiting. 20 tablet 0  . orphenadrine (NORFLEX) 100 MG tablet Take 1 tablet (100 mg total) by mouth 2 (two) times daily as needed for muscle spasms. 20 tablet 0  . pantoprazole (PROTONIX) 40 MG tablet TAKE 1 TABLET 30 MINUTES BEFORE BREAKFAST  AND SUPPER 60 tablet 5  . pramoxine-hydrocortisone (ANALPRAM HC) cream Apply topically 2 (two) times daily. 30 g 0  . ranitidine (ZANTAC) 300 MG capsule Take 300 mg by mouth every evening.    . thiamine 100 MG tablet Take 100 mg by mouth daily.      Marland Kitchen zolpidem (AMBIEN) 5 MG tablet TAKE 1 TABLET BY MOUTH AT BEDTIME AS NEEDED SLEEP 30 tablet 5  . calcium gluconate 650 MG tablet Take 1 tablet (650 mg total) by mouth daily. 30 tablet 2   Current Facility-Administered Medications on File Prior to Visit  Medication Dose Route Frequency Provider Last Rate Last Dose  . acetaminophen (TYLENOL) tablet 1,000 mg  1,000 mg Oral Once Amada Kingfisher, MD   1,000 mg at 08/09/13 1658    Allergies  Allergen Reactions  . Aspirin Other (See Comments)    REACTION: Gi Intolerance/ Burning in stomach  . Codeine Itching  . Trazodone And Nefazodone     "sick"  . Flagyl [Metronidazole] Rash  . Iodine Itching    Allergic to IVP dye  . Morphine And Related Itching    Family History  Problem Relation Age of Onset  . Esophageal cancer Son     deceased  . Diabetes Mother   . Heart disease Mother   . Colon cancer Paternal Uncle   . Kidney disease Sister   . Diabetes Father   . Hypertension Father   . Kidney disease Brother     x 3    BP   Pulse 97  Temp(Src) 97.7 F (36.5 C) (Oral)  Ht 5\' 5"  (1.651 m)  Wt 200 lb (90.719 kg)  BMI 33.28 kg/m2  SpO2 98%  Review of Systems No rash or fever    Objective:   Physical Exam VITAL SIGNS:  See vs page GENERAL: no distress Right shoulder: nontender.  Full ROM, but ROM is painful.       Assessment & Plan:  Shoulder pain, new.   Patient is advised the  following: Patient Instructions  X-rays are requested for you today.  We'll let you know about the results.  Please see a specialist.  you will receive a phone call, about a day and time for an appointment.   Please come in soon for a regular physical.   Here is a refill of the pain medication

## 2014-09-24 NOTE — Patient Instructions (Addendum)
X-rays are requested for you today.  We'll let you know about the results.  Please see a specialist.  you will receive a phone call, about a day and time for an appointment.   Please come in soon for a regular physical.   Here is a refill of the pain medication

## 2014-10-02 ENCOUNTER — Ambulatory Visit (INDEPENDENT_AMBULATORY_CARE_PROVIDER_SITE_OTHER): Payer: Medicare Other | Admitting: Endocrinology

## 2014-10-02 ENCOUNTER — Encounter: Payer: Self-pay | Admitting: Endocrinology

## 2014-10-02 VITALS — BP 120/80 | HR 105 | Temp 98.4°F | Ht 65.0 in | Wt 202.0 lb

## 2014-10-02 DIAGNOSIS — Z0000 Encounter for general adult medical examination without abnormal findings: Secondary | ICD-10-CM

## 2014-10-02 DIAGNOSIS — E538 Deficiency of other specified B group vitamins: Secondary | ICD-10-CM

## 2014-10-02 MED ORDER — CYANOCOBALAMIN 1000 MCG/ML IJ SOLN
1000.0000 ug | Freq: Once | INTRAMUSCULAR | Status: AC
  Administered 2014-10-02: 1000 ug via INTRAMUSCULAR

## 2014-10-02 NOTE — Patient Instructions (Addendum)
please consider these measures for your health:  minimize alcohol.  do not use tobacco products.  have a colonoscopy at least every 10 years from age 71.  Women should have an annual mammogram from age 90.  keep firearms safely stored.  always use seat belts.  have working smoke alarms in your home.  see an eye doctor and dentist regularly.  never drive under the influence of alcohol or drugs (including prescription drugs).   it is critically important to prevent falling down (keep floor areas well-lit, dry, and free of loose objects.  If you have a cane, walker, or wheelchair, you should use it, even for short trips around the house.  Also, try not to rush). good diet and exercise significantly improve the control of your diabetes.  please let me know if you wish to be referred to a dietician.  high blood sugar is very risky to your health.  you should see an eye doctor and dentist every year.  It is very important to get all recommended vaccinations.  Please come back for a follow-up appointment in 6 months.

## 2014-10-02 NOTE — Progress Notes (Signed)
Subjective:    Patient ID: [name], female    DOB: [birth year], 69 y.o.   MRN: [id number]  HPI Pt is here for regular wellness examination, and is feeling pretty well in general, and says chronic med probs are stable, except as noted below Past Medical History  Diagnosis Date  . Obesity   . PUD (peptic ulcer disease)   . Leukopenia   . Short bowel syndrome   . Internal hemorrhoids without mention of complication   . Stricture and stenosis of esophagus   . Osteoarthrosis, unspecified whether generalized or localized, unspecified site   . Thyrotoxicosis without mention of goiter or other cause, without mention of thyrotoxic crisis or storm   . Other and unspecified hyperlipidemia   . Gout, unspecified   . Diverticulosis of colon (without mention of hemorrhage)   . C. difficile colitis   . VITAMIN B12 DEFICIENCY 08/30/2009  . GOITER, MULTINODULAR 04/02/2009  . HYPOTHYROIDISM, POST-RADIATION 08/13/2009  . ASYMPTOMATIC POSTMENOPAUSAL STATUS 10/11/2008  . Esophageal reflux 06/12/2008  . PONV (postoperative nausea and vomiting)   . Varicose veins   . Blood transfusion   . UTI (urinary tract infection)   . Kidney stones     "several"  . Unspecified essential hypertension   . Angina   . Shortness of breath on exertion     "sometimes"  . ANEMIA, IRON DEFICIENCY 05/08/2009  . History of lower GI bleeding   . H/O hiatal hernia   . Migraines   . Breast cancer 09/29/11    invasive grade III ductal ca,assoc high grade dcis,ER/PR=neg  . History of radiation therapy 02/08/12-03/25/12    left breast,total 61gy  . Blood transfusion without reported diagnosis   . Hypomagnesemia   . Hypokalemia 05/11/2013  . Gastroparesis   . Type II or unspecified type diabetes mellitus without mention of complication, not stated as uncontrolled     no med in years diet controled    Past Surgical History  Procedure Laterality Date  . Vein ligation and stripping  1980's    Right leg  .  Lithotripsy      "4 or 5 times"  . Bunionectomy  1970's    bilateral  . Colonoscopy  2012    multiple   . Esophagogastroduodenoscopy  2011    multiple   . Thyroid ultrasound  12/1994 and 12/1995  . Mastectomy w/ nodes partial  09/29/11    left  . Cholecystectomy  1990's  . Abdominal hysterectomy  1970's    with BSO  . Dilation and curettage of uterus    . Colon surgery      "several surgeries for short bowel syndrome"  . Abdominal adhesion surgery  1980's thru 1990's    "several"  . Kidney stone surgery  1990's    "tried to go up & get it but pushed it further up"  . Portacath placement  09/29/2011    Procedure: INSERTION PORT-A-CATH;  Surgeon: Adin Hector, MD;  Location: [facility];  Service: General;  Laterality: N/A;  . Breast biopsy  08/13/11    left breast lower inner quadrant  . Breast lumpectomy w/ needle localization  09/29/11    left  breast=lymph node,excision benign/ ER/PR=neg, her 2 Positive  . Flexible sigmoidoscopy  2011    multiple   . Port-a-cath removal Right 12/19/2013    Procedure: MINOR REMOVAL PORT-A-CATH;  Surgeon: Adin Hector, MD;  Location: [facility];  Service: General;  Laterality: Right;  History   Social History  . Marital Status: Married    Spouse Name: N/A  . Number of Children: 2  . Years of Education: N/A   Occupational History  . Not on file.   Social History Main Topics  . Smoking status: Former Smoker -- 1.00 packs/day for 10 years    Types: Cigarettes    Quit date: 09/22/1985  . Smokeless tobacco: Never Used  . Alcohol Use: No     Comment: 09/29/11 "used to drink socially years ago"  . Drug Use: No  . Sexual Activity: No     Comment: HRT x many yrs   Other Topics Concern  . Not on file   Social History Narrative   Pt gets regular exercise    Current Outpatient Prescriptions on File Prior to Visit  Medication Sig Dispense Refill  . Alum & Mag Hydroxide-Simeth (MAGIC MOUTHWASH W/LIDOCAINE) SOLN Take 5 mLs  by mouth 4 (four) times daily. 1 part viscous lidocaine 2%, 1 part Maalox, 1 part diphenhydramine 12.5 mg per 5 ml elixir  Swish,gargle and spit one to two teaspoonfuls every six hours as needed 240 mL 0  . aMILoride (MIDAMOR) 5 MG tablet Take 5 mg by mouth 2 (two) times daily.   11  . calcium carbonate (TUMS - DOSED IN MG ELEMENTAL CALCIUM) 500 MG chewable tablet Chew 1 tablet by mouth as needed for indigestion or heartburn.    . Calcium Carbonate-Vitamin D (CALCIUM-VITAMIN D) 600-200 MG-UNIT CAPS Take 1 capsule by mouth daily.      . cyanocobalamin (,VITAMIN B-12,) 1000 MCG/ML injection Inject 1,000 mcg into the muscle every 30 (thirty) days. Next one due the 26th of this month    . desipramine (NOPRAMIN) 10 MG tablet Take 1 tablet (10 mg total) by mouth at bedtime. 90 tablet 1  . diltiazem 2 % GEL Apply 1 application topically 2 (two) times daily. 30 g 0  . diphenhydrAMINE (BENADRYL) 50 MG capsule   0  . diphenoxylate-atropine (LOMOTIL) 2.5-0.025 MG per tablet Take 2-3 times daily as needed for diarrhea. 30 tablet 0  . ergocalciferol (VITAMIN D2) 50000 UNITS capsule 1 tab, twice a week (Patient taking differently: Take 50,000 Units by mouth 2 (two) times a week. Sunday and Monday) 10 capsule 5  . ferrous sulfate 325 (65 FE) MG tablet Take 325 mg by mouth daily with breakfast.    . fluorometholone (FML) 0.1 % ophthalmic suspension Place 1 drop into both eyes 2 (two) times daily.    Marland Kitchen levothyroxine (SYNTHROID, LEVOTHROID) 150 MCG tablet TAKE 1 TABLET BY MOUTH DAILY BEFORE BREAKFAST. 30 tablet 0  . loperamide (IMODIUM) 2 MG capsule Take 2 capsules (4 mg total) by mouth every 4 (four) hours as needed for diarrhea or loose stools. 30 capsule 0  . lovastatin (MEVACOR) 20 MG tablet TAKE 1 TABLET (20 MG TOTAL) BY MOUTH DAILY AT 6 PM. 90 tablet 1  . magnesium oxide (MAG-OX) 400 (241.3 MG) MG tablet   11  . magnesium oxide (MAG-OX) 400 MG tablet Take 40 mg by mouth 3 (three) times daily.     . ondansetron  (ZOFRAN) 4 MG tablet Take 1 tablet (4 mg total) by mouth every 8 (eight) hours as needed for nausea or vomiting. 20 tablet 0  . orphenadrine (NORFLEX) 100 MG tablet Take 1 tablet (100 mg total) by mouth 2 (two) times daily as needed for muscle spasms. 20 tablet 0  . oxyCODONE-acetaminophen (PERCOCET) 7.5-325 MG per tablet Take 1 tab  every 4-6 hours as needed for pain. 50 tablet 0  . pantoprazole (PROTONIX) 40 MG tablet TAKE 1 TABLET 30 MINUTES BEFORE BREAKFAST AND SUPPER 60 tablet 5  . pramoxine-hydrocortisone (ANALPRAM HC) cream Apply topically 2 (two) times daily. 30 g 0  . ranitidine (ZANTAC) 300 MG capsule Take 300 mg by mouth every evening.    . ranitidine (ZANTAC) 300 MG capsule TAKE ONE CAPSULE BY MOUTH AT BEDTIME 30 capsule 4  . thiamine 100 MG tablet Take 100 mg by mouth daily.      Marland Kitchen zolpidem (AMBIEN) 5 MG tablet TAKE 1 TABLET BY MOUTH AT BEDTIME AS NEEDED SLEEP 30 tablet 5  . calcium gluconate 650 MG tablet Take 1 tablet (650 mg total) by mouth daily. 30 tablet 2   Current Facility-Administered Medications on File Prior to Visit  Medication Dose Route Frequency Provider Last Rate Last Dose  . acetaminophen (TYLENOL) tablet 1,000 mg  1,000 mg Oral Once Amada Kingfisher, MD   1,000 mg at 08/09/13 1658    Allergies  Allergen Reactions  . Aspirin Other (See Comments)    REACTION: Gi Intolerance/ Burning in stomach  . Codeine Itching  . Trazodone And Nefazodone     "sick"  . Flagyl [Metronidazole] Rash  . Iodine Itching    Allergic to IVP dye  . Morphine And Related Itching    Family History  Problem Relation Age of Onset  . Esophageal cancer Son     deceased  . Diabetes Mother   . Heart disease Mother   . Colon cancer Paternal Uncle   . Kidney disease Sister   . Diabetes Father   . Hypertension Father   . Kidney disease Brother     x 3    BP 120/80 mmHg  Pulse 105  Temp(Src) 98.4 F (36.9 C) (Oral)  Ht 5\' 5"  (1.651 m)  Wt 202 lb (91.627 kg)  BMI 33.61 kg/m2  SpO2  98%     Review of Systems  Constitutional: Negative for fever and unexpected weight change.  HENT: Negative for ear pain.   Eyes: Negative for itching.  Respiratory: Negative for shortness of breath.   Cardiovascular: Negative for chest pain.  Gastrointestinal:       No change in chronic abd pain  Endocrine: Negative for polyuria.  Genitourinary: Negative for hematuria.  Musculoskeletal: Negative for gait problem.  Skin: Negative for rash.  Allergic/Immunologic: Positive for environmental allergies.  Neurological: Negative for dizziness and syncope.  Hematological: Does not bruise/bleed easily.  Psychiatric/Behavioral: Negative for dysphoric mood.       Objective:   Physical Exam VS: see vs page GEN: no distress HEAD: head: no deformity eyes: no periorbital swelling, no proptosis external nose and ears are normal mouth: no lesion seen NECK: supple, thyroid is not enlarged CHEST WALL: no deformity LUNGS:  Clear to auscultation CV: reg rate and rhythm, no murmur ABD: abdomen is soft, nontender.  no hepatosplenomegaly.  not distended.  no hernia.  Multiple old healed surgical scars MUSCULOSKELETAL: muscle bulk and strength are grossly normal.  no obvious joint swelling.  gait is normal and steady EXTEMITIES: no deformity.  no ulcer on the feet.  feet are of normal color and temp.  no edema PULSES: dorsalis pedis intact bilat.  no carotid bruit NEURO:  cn 2-12 grossly intact.   readily moves all 4's.  sensation is intact to touch on the feet SKIN:  Normal texture and temperature.  No rash or suspicious lesion is visible.  NODES:  None palpable at the neck PSYCH: alert, well-oriented.  Does not appear anxious nor depressed.        Assessment & Plan:  Wellness visit today, with problems stable, except as noted.   Subjective:   Patient here for Medicare annual wellness visit and management of other chronic and acute problems.     Risk factors: advanced age     49 of Physicians Providing Medical Care to Patient:  See "snapshot"   Activities of Daily Living: In your present state of health, do you have any difficulty performing the following activities?:  Preparing food and eating?: No  Bathing yourself: No  Getting dressed: No  Using the toilet:No  Moving around from place to place: No  In the past year have you fallen or had a near fall?:No    Home Safety: Has smoke detector and wears seat belts. Firearms are safely stored.  Diet and Exercise  Current exercise habits: pt says not very good Dietary issues discussed: pt reports a healthy diet   Depression Screen  Q1: Over the past two weeks, have you felt down, depressed or hopeless?no  Q2: Over the past two weeks, have you felt little interest or pleasure in doing things? no   The following portions of the patient's history were reviewed and updated as appropriate: allergies, current medications, past family history, past medical history, past social history, past surgical history and problem list.   Review of Systems  Denies hearing loss, and visual loss.  Objective:   Vision:  Sees opthalmologist Hearing: grossly normal Body mass index:  See vs page Msk: pt easily and quickly performs "get-up-and-go" from a sitting position Cognitive Impairment Assessment: cognition, memory and judgment appear normal.  remembers 3/3 at 5 minutes.  excellent recall.  can easily read and write a sentence.  alert and oriented x 3.     Assessment:   Medicare wellness utd on preventive parameters    Plan:   During the course of the visit the patient was educated and counseled about appropriate screening and preventive services including:       Fall prevention   Screening mammography  Bone densitometry screening  Diabetes screening  Nutrition counseling   Vaccines / LABS Zostavax / Pneumococcal Vaccine  today   Patient Instructions (the written plan) was given to the patient.

## 2014-10-02 NOTE — Progress Notes (Signed)
we discussed code status.  pt requests full code, but would not want to be started or maintained on artificial life-support measures if there was not a reasonable chance of recovery 

## 2014-10-03 DIAGNOSIS — Z0000 Encounter for general adult medical examination without abnormal findings: Secondary | ICD-10-CM | POA: Insufficient documentation

## 2014-10-08 ENCOUNTER — Encounter (INDEPENDENT_AMBULATORY_CARE_PROVIDER_SITE_OTHER): Payer: Self-pay

## 2014-10-08 ENCOUNTER — Ambulatory Visit
Admission: RE | Admit: 2014-10-08 | Discharge: 2014-10-08 | Disposition: A | Discharge status: Home / Self Care | Payer: Medicare Other | Source: Ambulatory Visit | Attending: Endocrinology | Admitting: Endocrinology

## 2014-10-08 DIAGNOSIS — Z853 Personal history of malignant neoplasm of breast: Secondary | ICD-10-CM | POA: Diagnosis not present

## 2014-10-11 ENCOUNTER — Encounter: Payer: Self-pay | Admitting: Gastroenterology

## 2014-10-11 ENCOUNTER — Ambulatory Visit: Payer: Medicare Other

## 2014-10-15 ENCOUNTER — Other Ambulatory Visit: Payer: Self-pay | Admitting: Endocrinology

## 2014-10-18 DIAGNOSIS — K648 Other hemorrhoids: Secondary | ICD-10-CM | POA: Diagnosis not present

## 2014-10-19 ENCOUNTER — Telehealth: Payer: Self-pay | Admitting: Endocrinology

## 2014-10-19 ENCOUNTER — Telehealth: Payer: Self-pay | Admitting: Gastroenterology

## 2014-10-19 MED ORDER — OXYCODONE-ACETAMINOPHEN 7.5-325 MG PO TABS: ORAL_TABLET | ORAL | Status: DC

## 2014-10-19 NOTE — Telephone Encounter (Signed)
Patient states she has nausea and vomited x 1 this week. She saw her PCP and was told to f/u with GI. Scheduled with Tye Savoy, NP on 10/30/14 at 2:00 PM.

## 2014-10-19 NOTE — Telephone Encounter (Signed)
I contacted the patient. She states that she has been hurting really bad and she would like a refill on her Oxycodone. Rx was last refilled on 09/24/2014.  Please advise, Thanks!

## 2014-10-19 NOTE — Telephone Encounter (Signed)
Patient ask if Dr Loanne Drilling will send some pain medication to her pharmacy, Hydrocortisone.

## 2014-10-19 NOTE — Telephone Encounter (Signed)
i printed 

## 2014-10-19 NOTE — Telephone Encounter (Signed)
Left voicemail advising rx for Oxycodone is ready for pick up. rx placed upfront.

## 2014-10-19 NOTE — Addendum Note (Signed)
Addended by: Renato Shin on: 10/19/2014 10:14 AM   Modules accepted: Orders

## 2014-10-30 ENCOUNTER — Ambulatory Visit: Payer: Medicare Other | Admitting: Nurse Practitioner

## 2014-11-01 ENCOUNTER — Ambulatory Visit (INDEPENDENT_AMBULATORY_CARE_PROVIDER_SITE_OTHER): Payer: Medicare Other | Admitting: Nurse Practitioner

## 2014-11-01 ENCOUNTER — Encounter: Payer: Self-pay | Admitting: Nurse Practitioner

## 2014-11-01 VITALS — BP 120/82 | Ht 65.0 in | Wt 203.2 lb

## 2014-11-01 DIAGNOSIS — K625 Hemorrhage of anus and rectum: Secondary | ICD-10-CM

## 2014-11-01 DIAGNOSIS — K644 Residual hemorrhoidal skin tags: Secondary | ICD-10-CM

## 2014-11-01 DIAGNOSIS — K648 Other hemorrhoids: Secondary | ICD-10-CM | POA: Diagnosis not present

## 2014-11-01 NOTE — Progress Notes (Signed)
     History of Present Illness:   Patient is a 69 year old female with multiple medical problems followed previously by Dr. Sharlett Iles but now [name].  She has history of GERD/peptic strictures, C. difficile, chronic abdominal pain, diverticular disease and chronic diarrhea.. She has a history of multiple abdominal surgeries including hysterectomy, bilateral salpingo-oophorectomy  and a cholecystectomy. She has had multiple small bowel obstructions and lysis of adhesions   Patient caledl the office to be worked in for nausea and vomiting. Today patient tells me she is here for hemorrhoidal bleeding. She saw Dr. Dalbert Batman at [facility] (history of breast surgery) and mentioned the rectal bleeding to him. Patient was given literature on hemorrhoidal surgery but Dr. Dalbert Batman wanted to know if patient needed a colonoscopy. Patient has chronic hemorrhoidal bleeding. She has used hemorrhoidal preparations intermittently for years. Patient is currently using a suppository as well as an external cream, both twice daily. The bleeding has improved over the last few days.    Current Medications, Allergies, Past Medical History, Past Surgical History, Family History and Social History were reviewed in [employer] record.   Physical Exam: General: Pleasant, black emale in no acute distress Head: Normocephalic and atraumatic Eyes:  sclerae anicteric, conjunctiva pink  Ears: Normal auditory acuity Lungs: Clear throughout to auscultation Heart: Regular rate and rhythm Abdomen: Soft, non distended, non-tender. No masses, no hepatomegaly. Normal bowel sounds Rectal: Large, inflamed external hemorrhoids Musculoskeletal: Symmetrical with no gross deformities  Extremities: No edema  Neurological: Alert oriented x 4, grossly nonfocal Psychological:  Alert and cooperative. Normal mood and affect  Assessment and Recommendations:  69 year old female with chronic intermittent painless rectal  bleeding. She has chronic hemorrhoidal bleeding.  Last colonoscopy was May 2012 with findings of internal / external hemorrhoids. No polyps. She has no family history of colon cancer. Next screening colonoscopy due May 2022.   Rectal bleeding improved with hemorrhoidal preparation, advised to complete treatment. Patient has discussed treatment options with surgery.

## 2014-11-01 NOTE — Patient Instructions (Signed)
Please follow up with Dr. Dalbert Batman.

## 2014-11-03 ENCOUNTER — Other Ambulatory Visit: Payer: Self-pay | Admitting: Endocrinology

## 2014-11-04 ENCOUNTER — Encounter: Payer: Self-pay | Admitting: Nurse Practitioner

## 2014-11-04 DIAGNOSIS — K644 Residual hemorrhoidal skin tags: Secondary | ICD-10-CM | POA: Insufficient documentation

## 2014-11-05 NOTE — Progress Notes (Signed)
Reviewed and agree with management plan.  Malcolm T. Stark, MD FACG 

## 2014-11-06 ENCOUNTER — Ambulatory Visit: Payer: Medicare Other

## 2014-11-13 ENCOUNTER — Encounter: Payer: Self-pay | Admitting: Internal Medicine

## 2014-11-13 ENCOUNTER — Ambulatory Visit (INDEPENDENT_AMBULATORY_CARE_PROVIDER_SITE_OTHER): Payer: Medicare Other | Admitting: Internal Medicine

## 2014-11-13 ENCOUNTER — Telehealth: Payer: Self-pay | Admitting: Endocrinology

## 2014-11-13 VITALS — BP 122/76 | HR 77 | Temp 97.6°F | Resp 16 | Ht 65.0 in | Wt 204.0 lb

## 2014-11-13 DIAGNOSIS — N3001 Acute cystitis with hematuria: Secondary | ICD-10-CM

## 2014-11-13 LAB — POCT URINALYSIS DIPSTICK
Bilirubin, UA: 1
GLUCOSE UA: NEGATIVE
Ketones, UA: NEGATIVE
Nitrite, UA: POSITIVE
PH UA: 6
Protein, UA: 15
UROBILINOGEN UA: 0.2

## 2014-11-13 MED ORDER — OXYCODONE-ACETAMINOPHEN 7.5-325 MG PO TABS: ORAL_TABLET | ORAL | Status: DC

## 2014-11-13 MED ORDER — CIPROFLOXACIN HCL 250 MG PO TABS: 250.0000 mg | ORAL_TABLET | Freq: Two times a day (BID) | ORAL | Status: DC

## 2014-11-13 NOTE — Progress Notes (Signed)
Pre visit review using our clinic review tool, if applicable. No additional management support is needed unless otherwise documented below in the visit note. 

## 2014-11-13 NOTE — Telephone Encounter (Signed)
Note:

## 2014-11-13 NOTE — Telephone Encounter (Signed)
Patient called to schedule an appointment with Dr. Loanne Drilling due to possible UTI  Mrs. Devos was referred to [facility]    Please advise   Thank you

## 2014-11-13 NOTE — Progress Notes (Signed)
Subjective:  Patient ID: [name], female    DOB: [birth year]  Age: 69 y.o. MRN: [id number]  CC: Urinary Tract Infection   HPI [name] presents for dysuria for 3-4 days with nocturia, urgency, frequency, and bladder pain.  Outpatient Prescriptions Prior to Visit  Medication Sig Dispense Refill  . aMILoride (MIDAMOR) 5 MG tablet Take 5 mg by mouth 2 (two) times daily.   11  . calcium carbonate (TUMS - DOSED IN MG ELEMENTAL CALCIUM) 500 MG chewable tablet Chew 1 tablet by mouth as needed for indigestion or heartburn.    . Calcium Carbonate-Vitamin D (CALCIUM-VITAMIN D) 600-200 MG-UNIT CAPS Take 1 capsule by mouth daily.      . calcium gluconate 650 MG tablet Take 1 tablet (650 mg total) by mouth daily. 30 tablet 2  . cyanocobalamin (,VITAMIN B-12,) 1000 MCG/ML injection Inject 1,000 mcg into the muscle every 30 (thirty) days. Next one due the 26th of this month    . desipramine (NOPRAMIN) 10 MG tablet Take 1 tablet (10 mg total) by mouth at bedtime. 90 tablet 1  . diltiazem 2 % GEL Apply 1 application topically 2 (two) times daily. 30 g 0  . diphenoxylate-atropine (LOMOTIL) 2.5-0.025 MG per tablet Take 2-3 times daily as needed for diarrhea. 30 tablet 0  . ergocalciferol (VITAMIN D2) 50000 UNITS capsule 1 tab, twice a week (Patient taking differently: Take 50,000 Units by mouth 2 (two) times a week. Sunday and Monday) 10 capsule 5  . ferrous sulfate 325 (65 FE) MG tablet Take 325 mg by mouth daily with breakfast.    . fluorometholone (FML) 0.1 % ophthalmic suspension Place 1 drop into both eyes 2 (two) times daily.    Marland Kitchen levothyroxine (SYNTHROID, LEVOTHROID) 150 MCG tablet TAKE 1 TABLET BY MOUTH DAILY BEFORE BREAKFAST. 30 tablet 0  . loperamide (IMODIUM) 2 MG capsule Take 2 capsules (4 mg total) by mouth every 4 (four) hours as needed for diarrhea or loose stools. 30 capsule 0  . lovastatin (MEVACOR) 20 MG tablet TAKE 1 TABLET (20 MG TOTAL) BY MOUTH DAILY AT 6 PM. 90  tablet 1  . lovastatin (MEVACOR) 20 MG tablet TAKE 1 TABLET (20 MG TOTAL) BY MOUTH DAILY AT 6 PM. 30 tablet 2  . magnesium oxide (MAG-OX) 400 (241.3 MG) MG tablet   11  . magnesium oxide (MAG-OX) 400 MG tablet Take 40 mg by mouth 3 (three) times daily.     . ondansetron (ZOFRAN) 4 MG tablet Take 1 tablet (4 mg total) by mouth every 8 (eight) hours as needed for nausea or vomiting. 20 tablet 0  . pantoprazole (PROTONIX) 40 MG tablet TAKE 1 TABLET 30 MINUTES BEFORE BREAKFAST AND SUPPER 60 tablet 5  . pramoxine-hydrocortisone (ANALPRAM HC) cream Apply topically 2 (two) times daily. 30 g 0  . ranitidine (ZANTAC) 300 MG capsule TAKE ONE CAPSULE BY MOUTH AT BEDTIME 30 capsule 4  . thiamine 100 MG tablet Take 100 mg by mouth daily.      Marland Kitchen zolpidem (AMBIEN) 5 MG tablet TAKE 1 TABLET BY MOUTH AT BEDTIME AS NEEDED FOR SLEEP 30 tablet 5  . oxyCODONE-acetaminophen (PERCOCET) 7.5-325 MG per tablet Take 1 tab every 4-6 hours as needed for pain. 50 tablet 0   Facility-Administered Medications Prior to Visit  Medication Dose Route Frequency Provider Last Rate Last Dose  . acetaminophen (TYLENOL) tablet 1,000 mg  1,000 mg Oral Once Amada Kingfisher, MD   1,000 mg at 08/09/13 1658  ROS Review of Systems  Constitutional: Negative.  Negative for fever, chills, diaphoresis, activity change, appetite change, fatigue and unexpected weight change.  HENT: Negative.   Eyes: Negative.   Respiratory: Negative.  Negative for cough, choking, chest tightness, shortness of breath and stridor.   Cardiovascular: Negative.  Negative for chest pain, palpitations and leg swelling.  Gastrointestinal: Negative.  Negative for nausea, vomiting, abdominal pain, diarrhea, constipation and blood in stool.  Endocrine: Negative.   Genitourinary: Positive for dysuria, urgency, frequency, hematuria and pelvic pain. Negative for flank pain, decreased urine volume and difficulty urinating.  Musculoskeletal: Negative.  Negative for myalgias,  back pain, joint swelling and arthralgias.  Skin: Negative.  Negative for rash.  Allergic/Immunologic: Negative.   Neurological: Negative.  Negative for dizziness, tremors, weakness, light-headedness and headaches.  Hematological: Negative.  Negative for adenopathy. Does not bruise/bleed easily.  Psychiatric/Behavioral: Negative.     Objective:  BP 122/76 mmHg  Pulse 77  Temp(Src) 97.6 F (36.4 C) (Oral)  Resp 16  Ht 5\' 5"  (1.651 m)  Wt 204 lb (92.534 kg)  BMI 33.95 kg/m2  SpO2 98%  BP Readings from Last 3 Encounters:  11/13/14 122/76  11/01/14 120/82  10/02/14 120/80    Wt Readings from Last 3 Encounters:  11/13/14 204 lb (92.534 kg)  11/01/14 203 lb 3 oz (92.165 kg)  10/02/14 202 lb (91.627 kg)    Physical Exam  Lab Results  Component Value Date   WBC 3.1* 04/20/2014   HGB 11.3* 04/20/2014   HCT 34.7* 04/20/2014   PLT 198.0 04/20/2014   GLUCOSE 88 06/26/2014   CHOL 140 04/20/2014   TRIG 152.0* 04/20/2014   HDL 68.30 04/20/2014   LDLDIRECT 52.1 06/24/2011   LDLCALC 41 04/20/2014   ALT 17 02/13/2014   AST 20 02/13/2014   NA 140 06/26/2014   K 4.2 06/26/2014   CL 105 06/26/2014   CREATININE 0.89 06/26/2014   BUN 21 06/26/2014   CO2 27 06/26/2014   TSH 0.41 04/20/2014   HGBA1C 5.8 04/20/2014   MICROALBUR 0.6 07/01/2012    Mm Diag Breast Tomo Bilateral  10/08/2014   CLINICAL DATA:  History of left breast cancer status post lumpectomy in 2013. The patient has no current complaints.  EXAM: DIGITAL DIAGNOSTIC BILATERAL MAMMOGRAM WITH 3D TOMOSYNTHESIS AND CAD  COMPARISON:  With priors.  ACR Breast Density Category a: The breast tissue is almost entirely fatty.  FINDINGS: Lumpectomy changes are seen in the left breast. There is no suspicious mass or malignant type microcalcifications in either breast.  Mammographic images were processed with CAD.  IMPRESSION: No evidence of malignancy in either breast.  RECOMMENDATION: Bilateral screening mammogram in 1 year is  recommended.  I have discussed the findings and recommendations with the patient. Results were also provided in writing at the conclusion of the visit. If applicable, a reminder letter will be sent to the patient regarding the next appointment.  BI-RADS CATEGORY  2: Benign.   Electronically Signed   By: Lillia Mountain M.D.   On: 10/08/2014 09:42    Assessment & Plan:   Taylour was seen today for urinary tract infection.  Diagnoses and all orders for this visit:  Acute cystitis with hematuria - her UA is abnormal, will empirically start cirpo, will check the urine clx and will change the antibiotic if indicated Orders: -     CULTURE, URINE COMPREHENSIVE; Future -     ciprofloxacin (CIPRO) 250 MG tablet; Take 1 tablet (250 mg total) by mouth  2 (two) times daily. -     oxyCODONE-acetaminophen (PERCOCET) 7.5-325 MG per tablet; Take 1 tab every 4-6 hours as needed for pain. -     POCT urinalysis dipstick -     Urine culture; Future   I am having [name] start on ciprofloxacin. I am also having her maintain her Calcium-Vitamin D, cyanocobalamin, thiamine, calcium gluconate, fluorometholone, calcium carbonate, loperamide, ferrous sulfate, magnesium oxide, diphenoxylate-atropine, levothyroxine, lovastatin, desipramine, pramoxine-hydrocortisone, diltiazem, aMILoride, ergocalciferol, magnesium oxide, ondansetron, pantoprazole, ranitidine, zolpidem, lovastatin, and oxyCODONE-acetaminophen.  Meds ordered this encounter  Medications  . ciprofloxacin (CIPRO) 250 MG tablet    Sig: Take 1 tablet (250 mg total) by mouth 2 (two) times daily.    Dispense:  10 tablet    Refill:  1  . oxyCODONE-acetaminophen (PERCOCET) 7.5-325 MG per tablet    Sig: Take 1 tab every 4-6 hours as needed for pain.    Dispense:  50 tablet    Refill:  0     Follow-up: Return in about 3 weeks (around 12/04/2014).  Scarlette Calico, MD

## 2014-11-13 NOTE — Patient Instructions (Signed)

## 2014-11-14 ENCOUNTER — Other Ambulatory Visit: Payer: Medicare Other

## 2014-11-14 ENCOUNTER — Encounter: Payer: Self-pay | Admitting: Internal Medicine

## 2014-11-14 DIAGNOSIS — N3001 Acute cystitis with hematuria: Secondary | ICD-10-CM | POA: Diagnosis not present

## 2014-11-17 LAB — URINE CULTURE

## 2014-11-18 MED ORDER — SULFAMETHOXAZOLE-TRIMETHOPRIM 800-160 MG PO TABS
1.0000 | ORAL_TABLET | Freq: Two times a day (BID) | ORAL | Status: DC
Start: 2014-11-18 — End: 2015-04-23

## 2014-11-18 NOTE — Addendum Note (Signed)
Addended by: Janith Lima on: 11/18/2014 07:01 PM   Modules accepted: Orders, Medications, SmartSet

## 2014-11-21 ENCOUNTER — Other Ambulatory Visit: Payer: Self-pay | Admitting: Endocrinology

## 2014-11-21 ENCOUNTER — Other Ambulatory Visit: Payer: Self-pay | Admitting: Gastroenterology

## 2014-11-22 ENCOUNTER — Encounter (HOSPITAL_COMMUNITY): Payer: Self-pay | Admitting: Emergency Medicine

## 2014-11-22 ENCOUNTER — Emergency Department (HOSPITAL_COMMUNITY): Payer: Medicare Other

## 2014-11-22 ENCOUNTER — Emergency Department (HOSPITAL_COMMUNITY)
Admission: EM | Admit: 2014-11-22 | Discharge: 2014-11-22 | Disposition: A | Discharge status: Home / Self Care | Payer: Medicare Other | Attending: Emergency Medicine | Admitting: Emergency Medicine

## 2014-11-22 DIAGNOSIS — Z87440 Personal history of urinary (tract) infections: Secondary | ICD-10-CM | POA: Insufficient documentation

## 2014-11-22 DIAGNOSIS — Z853 Personal history of malignant neoplasm of breast: Secondary | ICD-10-CM | POA: Insufficient documentation

## 2014-11-22 DIAGNOSIS — Z8711 Personal history of peptic ulcer disease: Secondary | ICD-10-CM | POA: Diagnosis not present

## 2014-11-22 DIAGNOSIS — E039 Hypothyroidism, unspecified: Secondary | ICD-10-CM | POA: Diagnosis not present

## 2014-11-22 DIAGNOSIS — I10 Essential (primary) hypertension: Secondary | ICD-10-CM | POA: Insufficient documentation

## 2014-11-22 DIAGNOSIS — E785 Hyperlipidemia, unspecified: Secondary | ICD-10-CM | POA: Insufficient documentation

## 2014-11-22 DIAGNOSIS — Z87891 Personal history of nicotine dependence: Secondary | ICD-10-CM | POA: Insufficient documentation

## 2014-11-22 DIAGNOSIS — R1030 Lower abdominal pain, unspecified: Secondary | ICD-10-CM | POA: Diagnosis not present

## 2014-11-22 DIAGNOSIS — Z79899 Other long term (current) drug therapy: Secondary | ICD-10-CM | POA: Insufficient documentation

## 2014-11-22 DIAGNOSIS — M1990 Unspecified osteoarthritis, unspecified site: Secondary | ICD-10-CM | POA: Insufficient documentation

## 2014-11-22 DIAGNOSIS — Z87442 Personal history of urinary calculi: Secondary | ICD-10-CM | POA: Diagnosis not present

## 2014-11-22 DIAGNOSIS — E119 Type 2 diabetes mellitus without complications: Secondary | ICD-10-CM | POA: Diagnosis not present

## 2014-11-22 DIAGNOSIS — D509 Iron deficiency anemia, unspecified: Secondary | ICD-10-CM | POA: Insufficient documentation

## 2014-11-22 DIAGNOSIS — K219 Gastro-esophageal reflux disease without esophagitis: Secondary | ICD-10-CM | POA: Insufficient documentation

## 2014-11-22 DIAGNOSIS — R310 Gross hematuria: Secondary | ICD-10-CM | POA: Diagnosis not present

## 2014-11-22 DIAGNOSIS — R109 Unspecified abdominal pain: Secondary | ICD-10-CM

## 2014-11-22 DIAGNOSIS — R102 Pelvic and perineal pain: Secondary | ICD-10-CM

## 2014-11-22 DIAGNOSIS — Z8619 Personal history of other infectious and parasitic diseases: Secondary | ICD-10-CM | POA: Diagnosis not present

## 2014-11-22 DIAGNOSIS — E669 Obesity, unspecified: Secondary | ICD-10-CM | POA: Insufficient documentation

## 2014-11-22 DIAGNOSIS — R110 Nausea: Secondary | ICD-10-CM | POA: Diagnosis not present

## 2014-11-22 LAB — COMPREHENSIVE METABOLIC PANEL
ALBUMIN: 3.6 g/dL (ref 3.5–5.0)
ALT: 14 U/L (ref 14–54)
AST: 22 U/L (ref 15–41)
Alkaline Phosphatase: 80 U/L (ref 38–126)
Anion gap: 10 (ref 5–15)
BILIRUBIN TOTAL: 0.6 mg/dL (ref 0.3–1.2)
BUN: 15 mg/dL (ref 6–20)
CALCIUM: 9.7 mg/dL (ref 8.9–10.3)
CHLORIDE: 109 mmol/L (ref 101–111)
CO2: 22 mmol/L (ref 22–32)
CREATININE: 1.14 mg/dL — AB (ref 0.44–1.00)
GFR calc Af Amer: 55 mL/min — ABNORMAL LOW (ref >60)
GFR calc non Af Amer: 48 mL/min — ABNORMAL LOW (ref >60)
Glucose, Bld: 90 mg/dL (ref 65–99)
Potassium: 3.8 mmol/L (ref 3.5–5.1)
Sodium: 141 mmol/L (ref 135–145)
Total Protein: 6.7 g/dL (ref 6.5–8.1)

## 2014-11-22 LAB — URINALYSIS, ROUTINE W REFLEX MICROSCOPIC
Bilirubin Urine: NEGATIVE
Glucose, UA: NEGATIVE mg/dL
HGB URINE DIPSTICK: NEGATIVE
Ketones, ur: NEGATIVE mg/dL
Leukocytes, UA: NEGATIVE
Nitrite: NEGATIVE
PH: 6 (ref 5.0–8.0)
Protein, ur: NEGATIVE mg/dL
SPECIFIC GRAVITY, URINE: 1.015 (ref 1.005–1.030)
Urobilinogen, UA: 0.2 mg/dL (ref 0.0–1.0)

## 2014-11-22 LAB — CBC WITH DIFFERENTIAL/PLATELET
Basophils Absolute: 0 10*3/uL (ref 0.0–0.1)
Basophils Relative: 0 % (ref 0–1)
EOS PCT: 1 % (ref 0–5)
Eosinophils Absolute: 0 10*3/uL (ref 0.0–0.7)
HCT: 28.4 % — ABNORMAL LOW (ref 36.0–46.0)
HEMOGLOBIN: 9.4 g/dL — AB (ref 12.0–15.0)
Lymphocytes Relative: 37 % (ref 12–46)
Lymphs Abs: 1.6 10*3/uL (ref 0.7–4.0)
MCH: 30.1 pg (ref 26.0–34.0)
MCHC: 33.1 g/dL (ref 30.0–36.0)
MCV: 91 fL (ref 78.0–100.0)
Monocytes Absolute: 0.4 10*3/uL (ref 0.1–1.0)
Monocytes Relative: 9 % (ref 3–12)
NEUTROS ABS: 2.3 10*3/uL (ref 1.7–7.7)
Neutrophils Relative %: 53 % (ref 43–77)
Platelets: 195 10*3/uL (ref 150–400)
RBC: 3.12 MIL/uL — ABNORMAL LOW (ref 3.87–5.11)
RDW: 13.8 % (ref 11.5–15.5)
WBC: 4.3 10*3/uL (ref 4.0–10.5)

## 2014-11-22 MED ORDER — FENTANYL CITRATE (PF) 100 MCG/2ML IJ SOLN
50.0000 ug | Freq: Once | INTRAMUSCULAR | Status: AC
  Administered 2014-11-22: 50 ug via INTRAVENOUS
  Filled 2014-11-22: qty 2

## 2014-11-22 MED ORDER — ONDANSETRON HCL 4 MG/2ML IJ SOLN
4.0000 mg | Freq: Once | INTRAMUSCULAR | Status: AC
  Administered 2014-11-22: 4 mg via INTRAVENOUS
  Filled 2014-11-22: qty 2

## 2014-11-22 MED ORDER — ONDANSETRON 4 MG PO TBDP: 4.0000 mg | ORAL_TABLET | Freq: Three times a day (TID) | ORAL | Status: DC | PRN

## 2014-11-22 MED ORDER — MORPHINE SULFATE 4 MG/ML IJ SOLN
4.0000 mg | Freq: Once | INTRAMUSCULAR | Status: AC
  Administered 2014-11-22: 4 mg via INTRAVENOUS
  Filled 2014-11-22: qty 1

## 2014-11-22 NOTE — ED Provider Notes (Signed)
CSN: [id number]     Arrival date & time 11/22/14  0458 History   First MD Initiated Contact with Patient 11/22/14 0602     Chief Complaint  Patient presents with  . Flank Pain     (Consider location/radiation/quality/duration/timing/severity/associated sxs/prior Treatment) Patient is a 69 y.o. female presenting with flank pain. The history is provided by the patient and medical records.  Flank Pain Associated symptoms include nausea.    This is a 69 year old female with hx of PUD, gout, hypothyroidism, GERD, kidney stones, anemia, migraines, presenting to the ED for abdominal pain.  Patient states about 1 week ago she began having urinary symptoms (dysuria, hematuria, frequency), and was started on abx for UTI.  She was initially started on ciprofloxacin, however urine culture showed resistance and she was switched to bactrim.  She states since this the dysuria has improved, however she has begun having right flank and suprapubic pain.  She also reports nausea without vomiting.  Denies fever, chills.  Patient does have hx of kidney stones, states unsure if this feels similar.  She states "something is not right inside".  She does have hx of multiple abdominal surgeries with known adhesions.  BMs have been normal, takes miralax daily.  VSS.  Past Medical History  Diagnosis Date  . Obesity   . PUD (peptic ulcer disease)   . Leukopenia   . Short bowel syndrome   . Internal hemorrhoids without mention of complication   . Stricture and stenosis of esophagus   . Osteoarthrosis, unspecified whether generalized or localized, unspecified site   . Thyrotoxicosis without mention of goiter or other cause, without mention of thyrotoxic crisis or storm   . Other and unspecified hyperlipidemia   . Gout, unspecified   . Diverticulosis of colon (without mention of hemorrhage)   . C. difficile colitis   . VITAMIN B12 DEFICIENCY 08/30/2009  . GOITER, MULTINODULAR 04/02/2009  . 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 ASYMPTOMATIC POSTMENOPAUSAL STATUS 10/11/2008  . Esophageal reflux 06/12/2008  . PONV (postoperative nausea and vomiting)   . Varicose veins   . Blood transfusion   . UTI (urinary tract infection)   . Kidney stones     "several"  . Unspecified essential hypertension   . Angina   . Shortness of breath on exertion     "sometimes"  . ANEMIA, IRON DEFICIENCY 05/08/2009  . History of lower GI bleeding   . H/O hiatal hernia   . Migraines   . Breast cancer 09/29/11    invasive grade III ductal ca,assoc high grade dcis,ER/PR=neg  . History of radiation therapy 02/08/12-03/25/12    left breast,total 61gy  . Blood transfusion without reported diagnosis   . Hypomagnesemia   . Hypokalemia 05/11/2013  . Gastroparesis   . Type II or unspecified type diabetes mellitus without mention of complication, not stated as uncontrolled     no med in years diet controled   Past Surgical History  Procedure Laterality Date  . Vein ligation and stripping  1980's    Right leg  . Lithotripsy      "4 or 5 times"  . Bunionectomy  1970's    bilateral  . Colonoscopy  2012    multiple   . Esophagogastroduodenoscopy  2011    multiple   . Thyroid ultrasound  12/1994 and 12/1995  . Mastectomy w/ nodes partial  09/29/11    left  . Cholecystectomy  1990's  . Abdominal hysterectomy  1970's    with BSO  .  Dilation and curettage of uterus    . Colon surgery      "several surgeries for short bowel syndrome"  . Abdominal adhesion surgery  1980's thru 1990's    "several"  . Kidney stone surgery  1990's    "tried to go up & get it but pushed it further up"  . Portacath placement  09/29/2011    Procedure: INSERTION PORT-A-CATH;  Surgeon: Adin Hector, MD;  Location: [facility];  Service: General;  Laterality: N/A;  . Breast biopsy  08/13/11    left breast lower inner quadrant  . Breast lumpectomy w/ needle localization  09/29/11    left  breast=lymph node,excision benign/ ER/PR=neg, her 2  Positive  . Flexible sigmoidoscopy  2011    multiple   . Port-a-cath removal Right 12/19/2013    Procedure: MINOR REMOVAL PORT-A-CATH;  Surgeon: Adin Hector, MD;  Location: [facility];  Service: General;  Laterality: Right;   Family History  Problem Relation Age of Onset  . Esophageal cancer Son     deceased  . Diabetes Mother   . Heart disease Mother   . Colon cancer Paternal Uncle   . Kidney disease Sister   . Diabetes Father   . Hypertension Father   . Kidney disease Brother     x 3   History  Substance Use Topics  . Smoking status: Former Smoker -- 1.00 packs/day for 10 years    Types: Cigarettes    Quit date: 09/22/1985  . Smokeless tobacco: Never Used  . Alcohol Use: No     Comment: 09/29/11 "used to drink socially years ago"   OB History    No data available     Review of Systems  Gastrointestinal: Positive for nausea.  Genitourinary: Positive for flank pain.  All other systems reviewed and are negative.     Allergies  Aspirin; Codeine; Trazodone and nefazodone; Flagyl; Iodine; and Morphine and related  Home Medications   Prior to Admission medications   Medication Sig Start Date End Date Taking? Authorizing Provider  aMILoride (MIDAMOR) 5 MG tablet Take 5 mg by mouth 2 (two) times daily.  06/11/14  Yes Historical Provider, MD  calcium carbonate (TUMS - DOSED IN MG ELEMENTAL CALCIUM) 500 MG chewable tablet Chew 1 tablet by mouth as needed for indigestion or heartburn.   Yes Historical Provider, MD  Calcium Carbonate-Vitamin D (CALCIUM-VITAMIN D) 600-200 MG-UNIT CAPS Take 1 capsule by mouth daily.     Yes Historical Provider, MD  calcium gluconate 650 MG tablet Take 1 tablet (650 mg total) by mouth daily. 01/14/12 11/22/14 Yes Consuela Mimes, MD  cyanocobalamin (,VITAMIN B-12,) 1000 MCG/ML injection Inject 1,000 mcg into the muscle every 30 (thirty) days.    Yes Historical Provider, MD  desipramine (NOPRAMIN) 10 MG tablet Take 1 tablet (10 mg  total) by mouth at bedtime. 04/23/14  Yes Renato Shin, MD  diltiazem 2 % GEL Apply 1 application topically 2 (two) times daily. 05/02/14  Yes Jessica D Zehr, PA-C  diphenoxylate-atropine (LOMOTIL) 2.5-0.025 MG per tablet Take 2-3 times daily as needed for diarrhea. 02/14/14  Yes Willia Craze, NP  ergocalciferol (VITAMIN D2) 50000 UNITS capsule 1 tab, twice a week Patient taking differently: Take 50,000 Units by mouth 2 (two) times a week. Sunday and Monday 06/27/14  Yes Renato Shin, MD  ferrous sulfate 325 (65 FE) MG tablet Take 325 mg by mouth daily with breakfast.   Yes Historical Provider, MD  fluorometholone (FML) 0.1 %  ophthalmic suspension Place 1 drop into both eyes 2 (two) times daily. 03/14/13  Yes Historical Provider, MD  levothyroxine (SYNTHROID, LEVOTHROID) 150 MCG tablet TAKE 1 TABLET BY MOUTH DAILY BEFORE BREAKFAST. 11/21/14  Yes Renato Shin, MD  loperamide (IMODIUM) 2 MG capsule Take 2 capsules (4 mg total) by mouth every 4 (four) hours as needed for diarrhea or loose stools. 01/16/14  Yes Renato Shin, MD  lovastatin (MEVACOR) 20 MG tablet TAKE 1 TABLET (20 MG TOTAL) BY MOUTH DAILY AT 6 PM. 11/05/14  Yes Renato Shin, MD  magnesium chloride (SLOW-MAG) 64 MG TBEC SR tablet Take 1 tablet by mouth 3 (three) times daily.   Yes Historical Provider, MD  magnesium oxide (MAG-OX) 400 (241.3 MG) MG tablet Take 400 mg by mouth 3 (three) times daily.  08/06/14  Yes Historical Provider, MD  ondansetron (ZOFRAN) 4 MG tablet Take 1 tablet (4 mg total) by mouth every 8 (eight) hours as needed for nausea or vomiting. 08/16/14  Yes Nicholas Lose, MD  oxyCODONE-acetaminophen (PERCOCET) 7.5-325 MG per tablet Take 1 tab every 4-6 hours as needed for pain. 11/13/14  Yes Janith Lima, MD  pantoprazole (PROTONIX) 40 MG tablet TAKE 1 TABLET 30 MINUTES BEFORE BREAKFAST AND SUPPER 11/21/14  Yes Ladene Artist, MD  pramoxine-hydrocortisone [facility]) cream Apply topically 2 (two) times daily. 05/02/14  Yes  Jessica D Zehr, PA-C  ranitidine (ZANTAC) 300 MG capsule TAKE ONE CAPSULE BY MOUTH AT BEDTIME 09/24/14  Yes Ladene Artist, MD  sulfamethoxazole-trimethoprim (BACTRIM DS,SEPTRA DS) 800-160 MG per tablet Take 1 tablet by mouth 2 (two) times daily. 11/18/14  Yes Janith Lima, MD  thiamine 100 MG tablet Take 100 mg by mouth daily.     Yes Historical Provider, MD  zolpidem (AMBIEN) 5 MG tablet TAKE 1 TABLET BY MOUTH AT BEDTIME AS NEEDED FOR SLEEP Patient taking differently: TAKE 1 TABLET BY MOUTH AT BEDTIME. 10/16/14  Yes Renato Shin, MD   BP 114/71 mmHg  Pulse 68  Temp(Src) 97.9 F (36.6 C) (Oral)  Resp 16  Ht 5' 5.5" (1.664 m)  Wt 190 lb (86.183 kg)  BMI 31.13 kg/m2  SpO2 91%   Physical Exam  Constitutional: She is oriented to person, place, and time. She appears well-developed and well-nourished.  HENT:  Head: Normocephalic and atraumatic.  Mouth/Throat: Oropharynx is clear and moist.  Eyes: Conjunctivae and EOM are normal. Pupils are equal, round, and reactive to light.  Neck: Normal range of motion.  Cardiovascular: Normal rate, regular rhythm and normal heart sounds.   Pulmonary/Chest: Effort normal and breath sounds normal. No respiratory distress. She has no wheezes.  Abdominal: Soft. Bowel sounds are normal. There is tenderness (mild) in the suprapubic area. There is CVA tenderness (right, mild). There is no guarding.  Musculoskeletal: Normal range of motion.  Neurological: She is alert and oriented to person, place, and time.  Skin: Skin is warm and dry.  Psychiatric: She has a normal mood and affect.  Nursing note and vitals reviewed.   ED Course  Procedures (including critical care time) Labs Review Labs Reviewed  CBC WITH DIFFERENTIAL/PLATELET - Abnormal; Notable for the following:    RBC 3.12 (*)    Hemoglobin 9.4 (*)    HCT 28.4 (*)    All other components within normal limits  COMPREHENSIVE METABOLIC PANEL - Abnormal; Notable for the following:    Creatinine,  Ser 1.14 (*)    GFR calc non Af Amer 48 (*)    GFR  calc Af Amer 55 (*)    All other components within normal limits  URINALYSIS, ROUTINE W REFLEX MICROSCOPIC (NOT AT [facility])    Imaging Review Ct Renal Stone Study  11/22/2014   CLINICAL DATA:  Progressive periumbilical and right-sided flank and inguinal region pain. Gross hematuria.  EXAM: CT ABDOMEN AND PELVIS WITHOUT CONTRAST  TECHNIQUE: Multidetector CT imaging of the abdomen and pelvis was performed following the standard protocol without oral or intravenous contrast material administration.  COMPARISON:  July 02, 2014  FINDINGS: There is patchy bibasilar atelectatic change. There are scattered foci of coronary artery calcification. There is a small hiatal hernia.  No focal liver lesions are identified on this noncontrast enhanced study. Gallbladder is absent. There is no appreciable biliary duct dilatation.  Spleen, pancreas, and adrenals appear normal.  Kidneys bilaterally show no evidence of mass or hydronephrosis on either side. There is no renal or ureteral calculus on either side.  In the pelvis, urinary bladder is midline with normal wall thickness. Uterus is absent. There are multiple surgical clips throughout the retroperitoneum and pelvis. The previously noted fluid collection in the right pelvis has resolved. Currently there is no pelvic mass or pelvic fluid collection. Appendix region appears unremarkable.  There is diffuse stool throughout the colon. Colon is mildly distended with stool. There is no bowel obstruction. No free air or portal venous air.  There is stable scarring in the periumbilical region an along portions of the anterior abdominal wall. There is rectus muscle atrophy anteriorly in the upper pelvic region, stable.  There is no appreciable ascites, adenopathy, or abscess in the abdomen or pelvis. There is atherosclerotic change in the aorta, but no apparent abdominal aortic aneurysm. There is degenerative change in the lumbar  spine. There are no blastic or lytic bone lesions.  IMPRESSION: Diffuse stool throughout the colon. Suspect a degree of chronic constipation.  Postoperative scarring in the periumbilical region and anterior abdominal wall area.  No bowel obstruction. No abscess. Interval resolution of right pelvic fluid collection since prior study.  There is no demonstrable renal or ureteral calculus. No hydronephrosis. No urinary bladder wall thickening. A cause for patient's symptoms has not been established with this study.  Uterus and gallbladder absent. There is a small hiatal hernia. There are foci of coronary artery calcification.   Electronically Signed   By: Lowella Grip III M.D.   On: 11/22/2014 07:12     EKG Interpretation None      MDM   Final diagnoses:  Suprapubic pain  Right flank pain   69 y.o. F here with right flank pain and suprapubic pain after treatment for UTI with bactrim.  No fever, chills, sweats.  BMs normal.  Patient afebrile, non-toxic.  Mild tenderness of right CVA as well as suprapubic region without peritoneal signs.  + nausea without vomiting, resolved after zofran.  Lab work reassuring.  U/a without signs of infection today.  Given hx, CT renal study was obtained revealing some mild constipation-- patient is on chronic opiates for other pain issues.  Encouraged to increase miralax to twice daily for the next 2 days to help relieve constipation.  Rx zofran PRN.  FU with PCP.  Discussed plan with patient, he/she acknowledged understanding and agreed with plan of care.  Return precautions given for new or worsening symptoms.  Of note, BP somewhat soft after morphine however patient asx of this and remains stable.  Suspect due to meds.  Larene Pickett, PA-C 11/22/14 [phone]  Larene Pickett, PA-C 11/22/14 8830  Julianne Rice, MD 11/27/14 [phone]

## 2014-11-22 NOTE — Discharge Instructions (Signed)
Finish the last 2 doses of your antibiotics. Increase miralax to twice daily for the next day or so to help relieve constipation. Follow-up with your primary care physician. Return here for new concerns.

## 2014-11-22 NOTE — ED Notes (Signed)
PA at bedside.

## 2014-11-22 NOTE — ED Notes (Addendum)
Pt BP low when doing discharge vitals, BP rechecked manually. Water given.

## 2014-11-22 NOTE — ED Notes (Signed)
Pt comes from home c/o flank pain and urinary symptoms x 1 week.  Pt states she went to PCP a couple weeks ago and was diagnosed with UTI, given antibiotics.  PCP called pt Sunday and told her she had E. Coli.  Pt states it used to burn when she urinated, but doesn't anymore, still has foul odor. Pt is nauseated and has L flank pain.

## 2014-11-26 DIAGNOSIS — K912 Postsurgical malabsorption, not elsewhere classified: Secondary | ICD-10-CM | POA: Diagnosis not present

## 2014-11-26 DIAGNOSIS — I10 Essential (primary) hypertension: Secondary | ICD-10-CM | POA: Diagnosis not present

## 2014-11-26 DIAGNOSIS — E119 Type 2 diabetes mellitus without complications: Secondary | ICD-10-CM | POA: Diagnosis not present

## 2014-11-27 ENCOUNTER — Ambulatory Visit (INDEPENDENT_AMBULATORY_CARE_PROVIDER_SITE_OTHER): Payer: Medicare Other | Admitting: Endocrinology

## 2014-11-27 ENCOUNTER — Encounter: Payer: Self-pay | Admitting: Endocrinology

## 2014-11-27 VITALS — BP 126/87 | HR 82 | Temp 97.8°F | Ht 65.0 in | Wt 205.0 lb

## 2014-11-27 DIAGNOSIS — N390 Urinary tract infection, site not specified: Secondary | ICD-10-CM | POA: Insufficient documentation

## 2014-11-27 LAB — URINALYSIS, ROUTINE W REFLEX MICROSCOPIC
BILIRUBIN URINE: NEGATIVE
Ketones, ur: NEGATIVE
LEUKOCYTES UA: NEGATIVE
NITRITE: NEGATIVE
SPECIFIC GRAVITY, URINE: 1.02 (ref 1.000–1.030)
TOTAL PROTEIN, URINE-UPE24: NEGATIVE
Urine Glucose: NEGATIVE
Urobilinogen, UA: 0.2 (ref 0.0–1.0)
pH: 6 (ref 5.0–8.0)

## 2014-11-27 NOTE — Progress Notes (Signed)
   Subjective:    Patient ID: [name], female    DOB: [birth year], 68 y.o.   MRN: [id number]  HPI Pt was recently seen in ER for constipation and UTI.  Right flank pain is resolved.    Review of Systems Denies fever    Objective:   Physical Exam VITAL SIGNS:  See vs page GENERAL: no distress Right flank: nontender   UA: neg    Assessment & Plan:  UIT, resolved Constipation, chronic  Patient is advised the following: Patient Instructions  Let's recheck the urine test.  We'll let you know about the results.   There is another medication for the bowels called "lactulose."  Please let me know if you want to try this. Please come back for a follow-up appointment in 4 months.

## 2014-11-27 NOTE — Patient Instructions (Addendum)
Let's recheck the urine test.  We'll let you know about the results.   There is another medication for the bowels called "lactulose."  Please let me know if you want to try this. Please come back for a follow-up appointment in 4 months.

## 2014-11-30 ENCOUNTER — Telehealth: Payer: Self-pay | Admitting: Endocrinology

## 2014-11-30 DIAGNOSIS — N3001 Acute cystitis with hematuria: Secondary | ICD-10-CM

## 2014-11-30 NOTE — Telephone Encounter (Signed)
Pt requesting pain med refill please she only has 2 pills left

## 2014-11-30 NOTE — Telephone Encounter (Signed)
Pt requesting pain med refill. Please advise.

## 2014-11-30 NOTE — Telephone Encounter (Signed)
Dr Ronnald Ramp refilled her medication last time. And I just checked and he is on vacation as well. I am unsure about her dx for the pain medication. I unfamiliar with her problems as well.

## 2014-11-30 NOTE — Telephone Encounter (Signed)
Patient says that she takes pain medication for intestinal problems. Oxycodone refused, patient to discuss with PCP

## 2014-11-30 NOTE — Telephone Encounter (Signed)
I do not see a diagnosis for the pain medication indication, how many that she take daily?

## 2014-11-30 NOTE — Telephone Encounter (Signed)
Dr Ronnald Ramp can you please review pt's request for pain medication refill in Dr Cordelia Pen absence. Dr Dwyane Dee is not familiar enough with this pt. Thank you for your help!

## 2014-12-04 MED ORDER — OXYCODONE-ACETAMINOPHEN 7.5-325 MG PO TABS: ORAL_TABLET | ORAL | Status: DC

## 2014-12-04 NOTE — Addendum Note (Signed)
Addended by: Renato Shin on: 12/04/2014 07:34 AM   Modules accepted: Orders

## 2014-12-04 NOTE — Telephone Encounter (Signed)
i printed 

## 2014-12-04 NOTE — Telephone Encounter (Signed)
Pt advised rx has been printed and is ready for pick up.Rx placed up front.

## 2014-12-28 ENCOUNTER — Other Ambulatory Visit (INDEPENDENT_AMBULATORY_CARE_PROVIDER_SITE_OTHER): Payer: Medicare Other | Admitting: *Deleted

## 2014-12-28 ENCOUNTER — Ambulatory Visit (INDEPENDENT_AMBULATORY_CARE_PROVIDER_SITE_OTHER): Payer: Medicare Other | Admitting: *Deleted

## 2014-12-28 DIAGNOSIS — E538 Deficiency of other specified B group vitamins: Secondary | ICD-10-CM | POA: Diagnosis not present

## 2014-12-28 MED ORDER — CYANOCOBALAMIN 1000 MCG/ML IJ SOLN
1000.0000 ug | Freq: Once | INTRAMUSCULAR | Status: AC
  Administered 2014-12-28: 1000 ug via INTRAMUSCULAR

## 2015-01-06 ENCOUNTER — Encounter: Payer: Self-pay | Admitting: Endocrinology

## 2015-01-08 ENCOUNTER — Other Ambulatory Visit: Payer: Self-pay | Admitting: Endocrinology

## 2015-01-09 ENCOUNTER — Encounter: Payer: Self-pay | Admitting: Endocrinology

## 2015-01-09 ENCOUNTER — Ambulatory Visit (INDEPENDENT_AMBULATORY_CARE_PROVIDER_SITE_OTHER): Payer: Medicare Other | Admitting: Endocrinology

## 2015-01-09 VITALS — BP 124/80 | HR 75 | Temp 98.0°F | Ht 65.0 in | Wt 207.0 lb

## 2015-01-09 DIAGNOSIS — N3001 Acute cystitis with hematuria: Secondary | ICD-10-CM | POA: Diagnosis not present

## 2015-01-09 DIAGNOSIS — E042 Nontoxic multinodular goiter: Secondary | ICD-10-CM | POA: Diagnosis not present

## 2015-01-09 MED ORDER — OXYCODONE-ACETAMINOPHEN 7.5-325 MG PO TABS: ORAL_TABLET | ORAL | Status: DC

## 2015-01-09 NOTE — Patient Instructions (Signed)
Let's recheck the ultrasound.  We'll see if it is any different.

## 2015-01-09 NOTE — Progress Notes (Signed)
Subjective:    Patient ID: [name], female    DOB: 1945/11/21, 69 y.o.   MRN: [id number]  HPI Pt returns for f/u of multinodular goiter (dx'ed 2004; bx in 2008 showed non-neoplastic goiter; most recent f/u US in 2015 was unchanged; she has been euthyroid)  states few weeks of slight swelling at the anterior neck, and assoc solid-only dysphagia.  Past Medical History  Diagnosis Date  . Obesity   . PUD (peptic ulcer disease)   . Leukopenia   . Short bowel syndrome   . Internal hemorrhoids without mention of complication   . Stricture and stenosis of esophagus   . Osteoarthrosis, unspecified whether generalized or localized, unspecified site   . Thyrotoxicosis without mention of goiter or other cause, without mention of thyrotoxic crisis or storm   . Other and unspecified hyperlipidemia   . Gout, unspecified   . Diverticulosis of colon (without mention of hemorrhage)   . C. difficile colitis   . VITAMIN B12 DEFICIENCY 08/30/2009  . GOITER, MULTINODULAR 04/02/2009  . HYPOTHYROIDISM, POST-RADIATION 08/13/2009  . ASYMPTOMATIC POSTMENOPAUSAL STATUS 10/11/2008  . Esophageal reflux 06/12/2008  . PONV (postoperative nausea and vomiting)   . Varicose veins   . Blood transfusion   . UTI (urinary tract infection)   . Kidney stones     "several"  . Unspecified essential hypertension   . Angina   . Shortness of breath on exertion     "sometimes"  . ANEMIA, IRON DEFICIENCY 05/08/2009  . History of lower GI bleeding   . H/O hiatal hernia   . Migraines   . Breast cancer 09/29/11    invasive grade III ductal ca,assoc high grade dcis,ER/PR=neg  . History of radiation therapy 02/08/12-03/25/12    left breast,total 61gy  . Blood transfusion without reported diagnosis   . Hypomagnesemia   . Hypokalemia 05/11/2013  . Gastroparesis   . Type II or unspecified type diabetes mellitus without mention of complication, not stated as uncontrolled     no med in years diet controled    Past  Surgical History  Procedure Laterality Date  . Vein ligation and stripping  1980's    Right leg  . Lithotripsy      "4 or 5 times"  . Bunionectomy  1970's    bilateral  . Colonoscopy  2012    multiple   . Esophagogastroduodenoscopy  2011    multiple   . Thyroid ultrasound  12/1994 and 12/1995  . Mastectomy w/ nodes partial  09/29/11    left  . Cholecystectomy  1990's  . Abdominal hysterectomy  1970's    with BSO  . Dilation and curettage of uterus    . Colon surgery      "several surgeries for short bowel syndrome"  . Abdominal adhesion surgery  1980's thru 1990's    "several"  . Kidney stone surgery  1990's    "tried to go up & get it but pushed it further up"  . Portacath placement  09/29/2011    Procedure: INSERTION PORT-A-CATH;  Surgeon: Adin Hector, MD;  Location: [facility];  Service: General;  Laterality: N/A;  . Breast biopsy  08/13/11    left breast lower inner quadrant  . Breast lumpectomy w/ needle localization  09/29/11    left  breast=lymph node,excision benign/ ER/PR=neg, her 2 Positive  . Flexible sigmoidoscopy  2011    multiple   . Port-a-cath removal Right 12/19/2013    Procedure: MINOR REMOVAL PORT-A-CATH;  Surgeon:  Adin Hector, MD;  Location: [facility];  Service: General;  Laterality: Right;    Social History   Social History  . Marital Status: Married    Spouse Name: N/A  . Number of Children: 2  . Years of Education: N/A   Occupational History  . RETIRED    Social History Main Topics  . Smoking status: Former Smoker -- 1.00 packs/day for 10 years    Types: Cigarettes    Quit date: 09/22/1985  . Smokeless tobacco: Never Used  . Alcohol Use: No     Comment: 09/29/11 "used to drink socially years ago"  . Drug Use: No  . Sexual Activity: No     Comment: HRT x many yrs   Other Topics Concern  . Not on file   Social History Narrative   Pt gets regular exercise    Current Outpatient Prescriptions on File Prior to Visit    Medication Sig Dispense Refill  . aMILoride (MIDAMOR) 5 MG tablet Take 5 mg by mouth 2 (two) times daily.   11  . calcium carbonate (TUMS - DOSED IN MG ELEMENTAL CALCIUM) 500 MG chewable tablet Chew 1 tablet by mouth as needed for indigestion or heartburn.    . Calcium Carbonate-Vitamin D (CALCIUM-VITAMIN D) 600-200 MG-UNIT CAPS Take 1 capsule by mouth daily.      . cyanocobalamin (,VITAMIN B-12,) 1000 MCG/ML injection Inject 1,000 mcg into the muscle every 30 (thirty) days.     Marland Kitchen desipramine (NOPRAMIN) 10 MG tablet Take 1 tablet (10 mg total) by mouth at bedtime. 90 tablet 1  . diltiazem 2 % GEL Apply 1 application topically 2 (two) times daily. 30 g 0  . diphenoxylate-atropine (LOMOTIL) 2.5-0.025 MG per tablet Take 2-3 times daily as needed for diarrhea. 30 tablet 0  . ergocalciferol (VITAMIN D2) 50000 UNITS capsule 1 tab, twice a week (Patient taking differently: Take 50,000 Units by mouth 2 (two) times a week. Sunday and Monday) 10 capsule 5  . ferrous sulfate 325 (65 FE) MG tablet Take 325 mg by mouth daily with breakfast.    . fluorometholone (FML) 0.1 % ophthalmic suspension Place 1 drop into both eyes 2 (two) times daily.    Marland Kitchen levothyroxine (SYNTHROID, LEVOTHROID) 150 MCG tablet TAKE 1 TABLET BY MOUTH DAILY BEFORE BREAKFAST. 30 tablet 1  . loperamide (IMODIUM) 2 MG capsule Take 2 capsules (4 mg total) by mouth every 4 (four) hours as needed for diarrhea or loose stools. 30 capsule 0  . lovastatin (MEVACOR) 20 MG tablet TAKE 1 TABLET (20 MG TOTAL) BY MOUTH DAILY AT 6 PM. 30 tablet 2  . magnesium chloride (SLOW-MAG) 64 MG TBEC SR tablet Take 1 tablet by mouth 3 (three) times daily.    . magnesium oxide (MAG-OX) 400 (241.3 MG) MG tablet Take 400 mg by mouth 3 (three) times daily.   11  . ondansetron (ZOFRAN ODT) 4 MG disintegrating tablet Take 1 tablet (4 mg total) by mouth every 8 (eight) hours as needed for nausea. 10 tablet 0  . pantoprazole (PROTONIX) 40 MG tablet TAKE 1 TABLET 30 MINUTES  BEFORE BREAKFAST AND SUPPER 60 tablet 5  . pramoxine-hydrocortisone (ANALPRAM HC) cream Apply topically 2 (two) times daily. 30 g 0  . ranitidine (ZANTAC) 300 MG capsule TAKE ONE CAPSULE BY MOUTH AT BEDTIME 30 capsule 4  . sulfamethoxazole-trimethoprim (BACTRIM DS,SEPTRA DS) 800-160 MG per tablet Take 1 tablet by mouth 2 (two) times daily. 10 tablet 1  . thiamine 100  MG tablet Take 100 mg by mouth daily.      Marland Kitchen zolpidem (AMBIEN) 5 MG tablet TAKE 1 TABLET BY MOUTH AT BEDTIME AS NEEDED FOR SLEEP (Patient taking differently: TAKE 1 TABLET BY MOUTH AT BEDTIME.) 30 tablet 5  . calcium gluconate 650 MG tablet Take 1 tablet (650 mg total) by mouth daily. 30 tablet 2   Current Facility-Administered Medications on File Prior to Visit  Medication Dose Route Frequency Provider Last Rate Last Dose  . acetaminophen (TYLENOL) tablet 1,000 mg  1,000 mg Oral Once Amada Kingfisher, MD   1,000 mg at 08/09/13 1658    Allergies  Allergen Reactions  . Aspirin Other (See Comments)    REACTION: Gi Intolerance/ Burning in stomach  . Codeine Itching  . Trazodone And Nefazodone     "sick"  . Flagyl [Metronidazole] Rash  . Iodine Itching    Allergic to IVP dye  . Morphine And Related Itching    Family History  Problem Relation Age of Onset  . Esophageal cancer Son     deceased  . Diabetes Mother   . Heart disease Mother   . Colon cancer Paternal Uncle   . Kidney disease Sister   . Diabetes Father   . Hypertension Father   . Kidney disease Brother     x 3    BP 124/80 mmHg  Pulse 75  Temp(Src) 98 F (36.7 C) (Oral)  Ht 5\' 5"  (1.651 m)  Wt 207 lb (93.895 kg)  BMI 34.45 kg/m2  SpO2 99%  Review of Systems Denies sob    Objective:   Physical Exam VITAL SIGNS:  See vs page. GENERAL: no distress. Neck: small multinodular goiter.       Assessment & Plan:  Neck swelling, new, uncertain etiology  Patient is advised the following: Patient Instructions  Let's recheck the ultrasound.  We'll see  if it is any different.

## 2015-01-11 ENCOUNTER — Ambulatory Visit
Admission: RE | Admit: 2015-01-11 | Discharge: 2015-01-11 | Disposition: A | Discharge status: Home / Self Care | Payer: Medicare Other | Source: Ambulatory Visit | Attending: Endocrinology | Admitting: Endocrinology

## 2015-01-11 DIAGNOSIS — E042 Nontoxic multinodular goiter: Secondary | ICD-10-CM | POA: Diagnosis not present

## 2015-01-14 ENCOUNTER — Telehealth: Payer: Self-pay | Admitting: Endocrinology

## 2015-01-14 NOTE — Telephone Encounter (Signed)
error 

## 2015-01-24 ENCOUNTER — Other Ambulatory Visit: Payer: Self-pay

## 2015-01-24 MED ORDER — LEVOTHYROXINE SODIUM 150 MCG PO TABS: ORAL_TABLET | ORAL | Status: DC

## 2015-01-25 ENCOUNTER — Other Ambulatory Visit: Payer: Self-pay

## 2015-01-25 MED ORDER — PANTOPRAZOLE SODIUM 40 MG PO TBEC: DELAYED_RELEASE_TABLET | ORAL | Status: DC

## 2015-01-25 MED ORDER — LOVASTATIN 20 MG PO TABS: ORAL_TABLET | ORAL | Status: DC

## 2015-01-28 ENCOUNTER — Other Ambulatory Visit (INDEPENDENT_AMBULATORY_CARE_PROVIDER_SITE_OTHER): Payer: Medicare Other

## 2015-01-28 ENCOUNTER — Other Ambulatory Visit: Payer: Self-pay

## 2015-01-28 ENCOUNTER — Ambulatory Visit (INDEPENDENT_AMBULATORY_CARE_PROVIDER_SITE_OTHER): Payer: Medicare Other

## 2015-01-28 DIAGNOSIS — E119 Type 2 diabetes mellitus without complications: Secondary | ICD-10-CM

## 2015-01-28 DIAGNOSIS — E538 Deficiency of other specified B group vitamins: Secondary | ICD-10-CM | POA: Diagnosis not present

## 2015-01-28 LAB — HEMOGLOBIN A1C: HEMOGLOBIN A1C: 5.8 % (ref 4.6–6.5)

## 2015-01-28 MED ORDER — CYANOCOBALAMIN 1000 MCG/ML IJ SOLN
1000.0000 ug | Freq: Once | INTRAMUSCULAR | Status: AC
  Administered 2015-01-28: 1000 ug via INTRAMUSCULAR

## 2015-02-11 ENCOUNTER — Other Ambulatory Visit: Payer: Self-pay | Admitting: Endocrinology

## 2015-02-14 ENCOUNTER — Telehealth: Payer: Self-pay | Admitting: Endocrinology

## 2015-02-14 DIAGNOSIS — N3001 Acute cystitis with hematuria: Secondary | ICD-10-CM

## 2015-02-14 MED ORDER — OXYCODONE-ACETAMINOPHEN 7.5-325 MG PO TABS: ORAL_TABLET | ORAL | Status: DC

## 2015-02-14 NOTE — Telephone Encounter (Signed)
Pt advised rx is ready for pick up. Rx placed upfront.  

## 2015-02-14 NOTE — Telephone Encounter (Signed)
See note below

## 2015-02-14 NOTE — Telephone Encounter (Signed)
i printed 

## 2015-02-14 NOTE — Telephone Encounter (Signed)
Patient need refill of oxyCODONE-acetaminophen (PERCOCET) 7.5-325 MG per tablet

## 2015-02-27 ENCOUNTER — Ambulatory Visit (INDEPENDENT_AMBULATORY_CARE_PROVIDER_SITE_OTHER): Payer: Medicare Other

## 2015-02-27 DIAGNOSIS — E538 Deficiency of other specified B group vitamins: Secondary | ICD-10-CM

## 2015-02-27 MED ORDER — CYANOCOBALAMIN 1000 MCG/ML IJ SOLN
1000.0000 ug | Freq: Once | INTRAMUSCULAR | Status: AC
  Administered 2015-02-27: 1000 ug via INTRAMUSCULAR

## 2015-03-07 DIAGNOSIS — M170 Bilateral primary osteoarthritis of knee: Secondary | ICD-10-CM | POA: Diagnosis not present

## 2015-03-07 IMAGING — CT CT ABD-PELV W/ CM
2 of 5 series · 16 of 46 positions shown, 18 images · IV contrast (OMNIPAQUE)
Comparison: 07/13/2011 and 01/31/2010

CLINICAL DATA: Mid to left side abdominal pain.  History breast
cancer.  Chemotherapy completed 2 months ago.  Left lumpectomy.
Colon resection due to small bowel obstruction.  Constipation.

CT ABDOMEN AND PELVIS WITH CONTRAST
TECHNIQUE: Multidetector CT imaging of the abdomen and pelvis was
performed following the standard protocol during bolus
administration of intravenous contrast.
Contrast: 100mL OMNIPAQUE IOHEXOL 300 MG/ML  SOLN

[Series 2: rtn a/p with · axial · 0.79mm/px · z∈[-434,-34]mm · 13 of 90 slices shown, 15 images]
[im 5/90  soft-tissue]
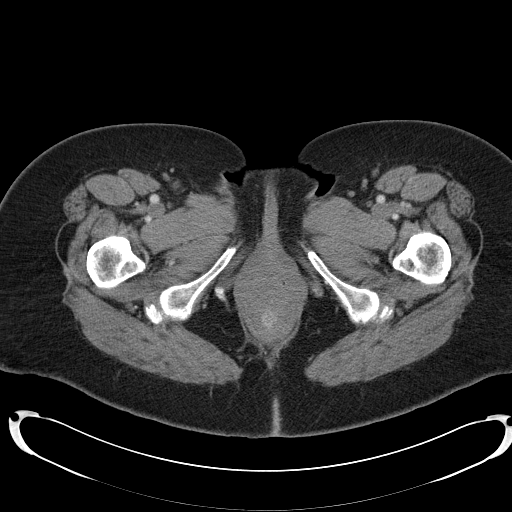
[im 5/90  bone]
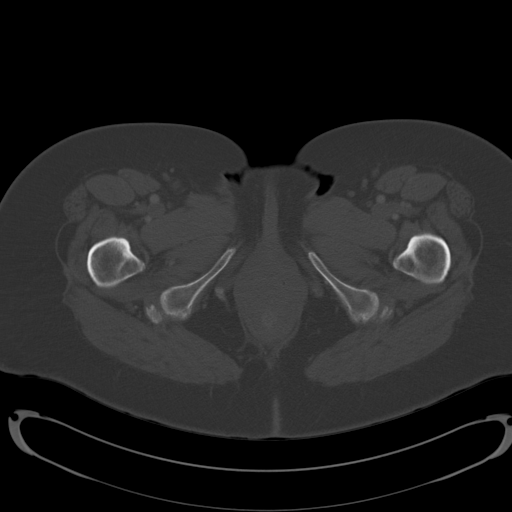
[im 13/90  soft-tissue]
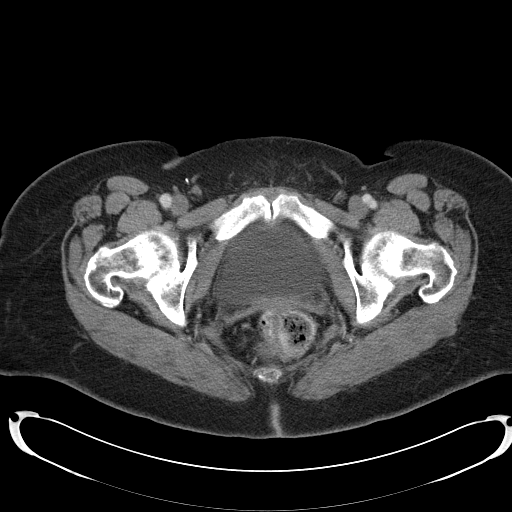
[im 17/90  soft-tissue]
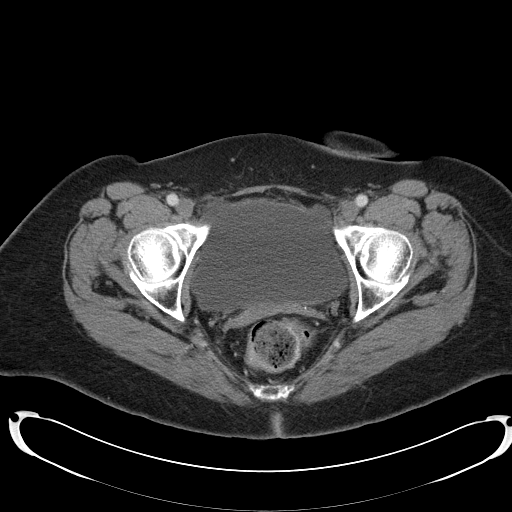
[im 26/90  soft-tissue]
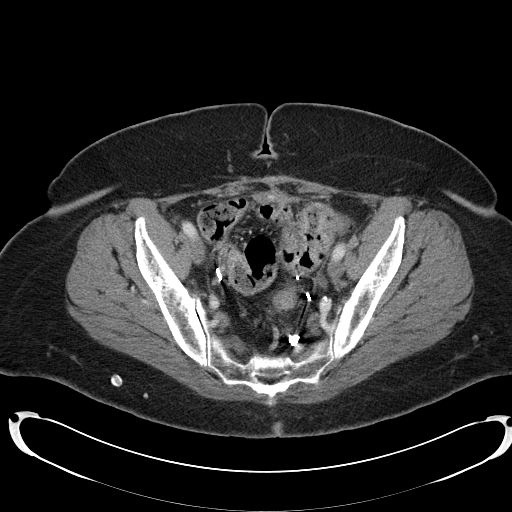
[im 30/90  soft-tissue]
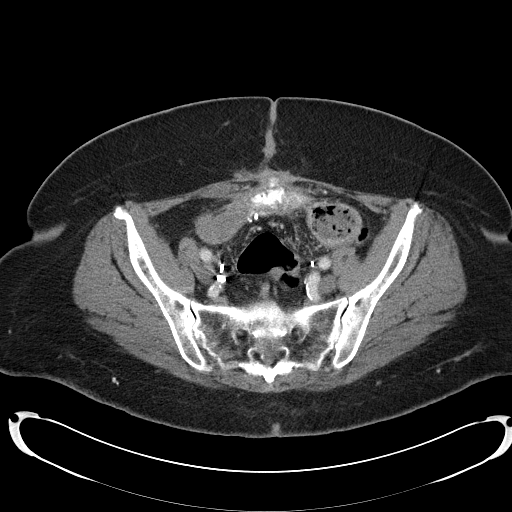
[im 39/90  soft-tissue]
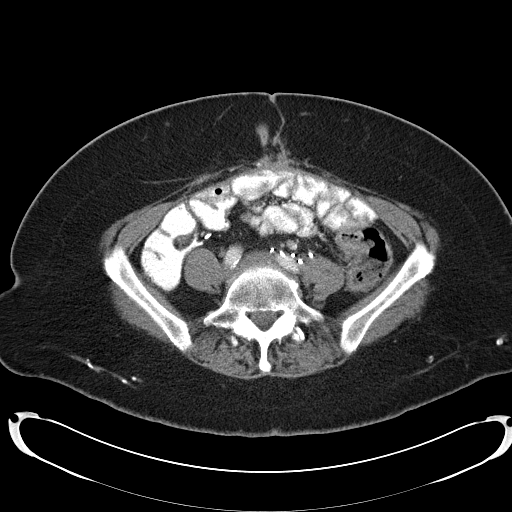
[im 47/90  soft-tissue]
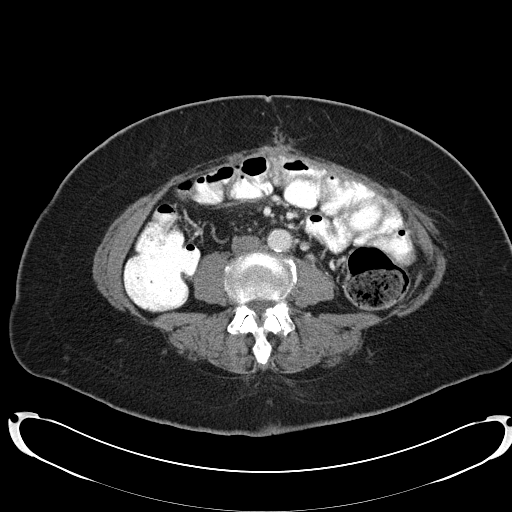
[im 51/90  soft-tissue]
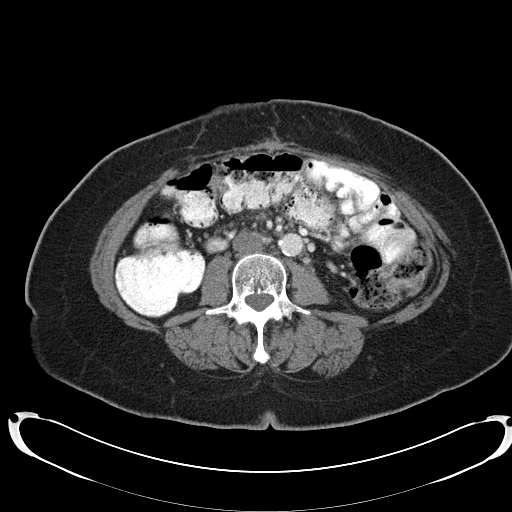
[im 60/90  soft-tissue]
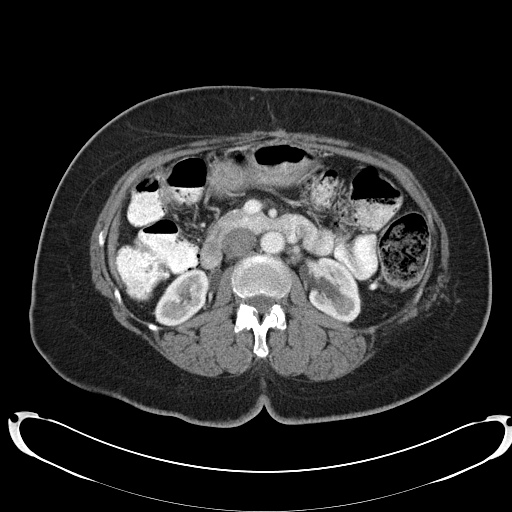
[im 60/90  bone]
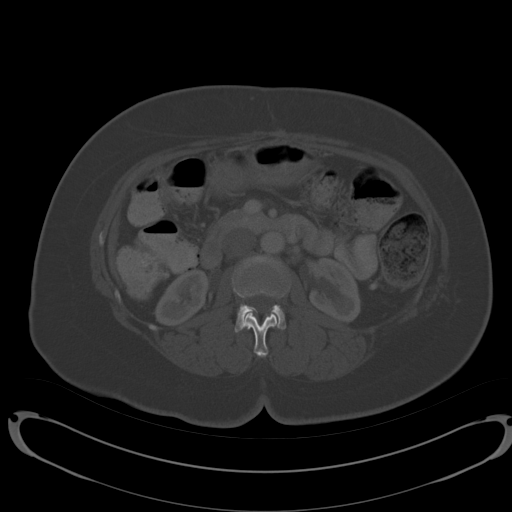
[im 64/90  soft-tissue]
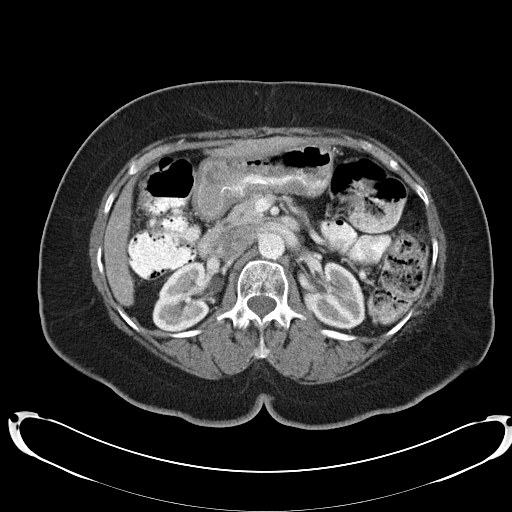
[im 73/90  soft-tissue]
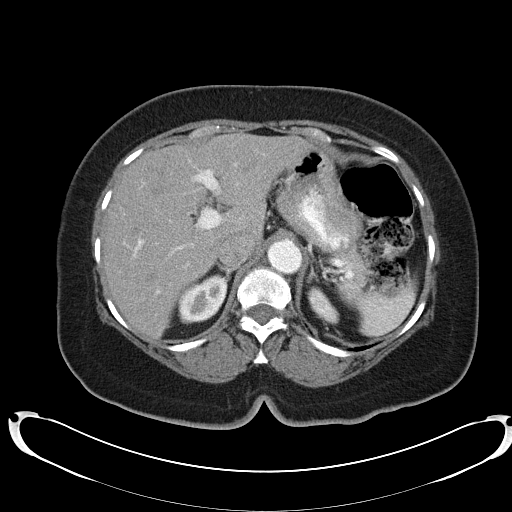
[im 77/90  soft-tissue]
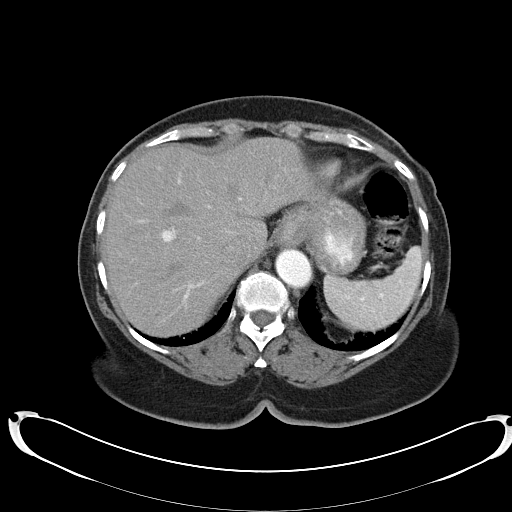
[im 85/90  soft-tissue]
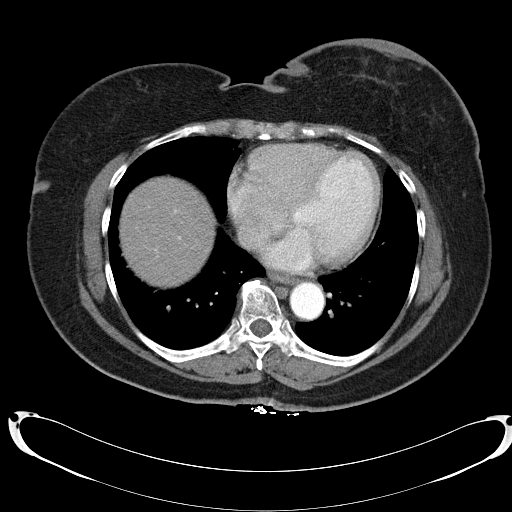

[Series 602: <mpr thick range> · coronal · 0.88mm/px · 3 of 74 slices shown]
[im 25/74  soft-tissue]
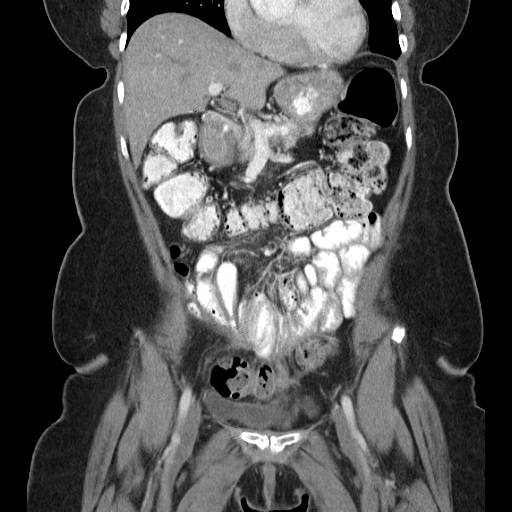
[im 33/74  soft-tissue]
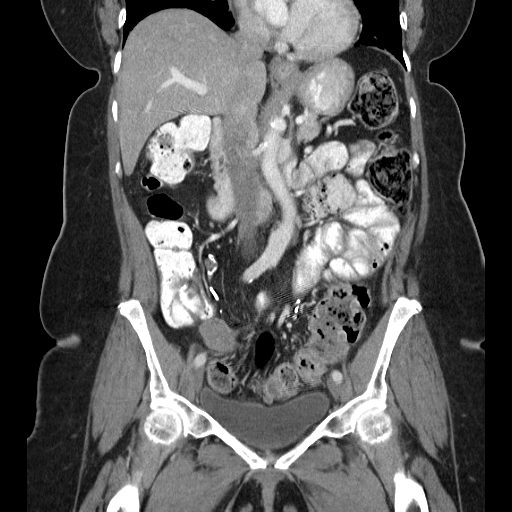
[im 41/74  soft-tissue]
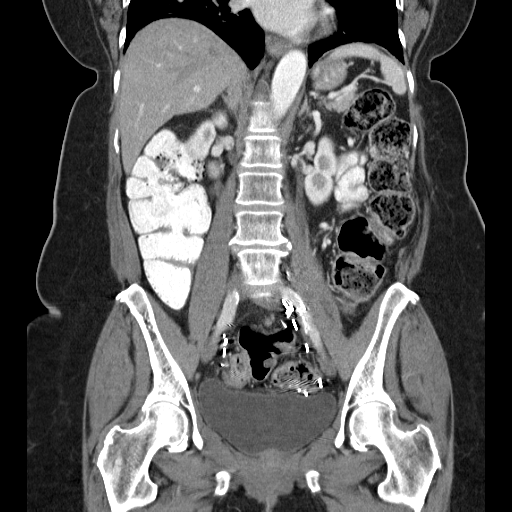

[16 of 46 positions shown; findings below may reference images not displayed]

FINDINGS: Lung bases:  Clear lung bases.  Mild cardiomegaly,
without pericardial or pleural effusion.  Left breast skin
thickening which could be treatment related.

Abdomen/pelvis:  Normal liver, spleen.  A small hiatal hernia.
Mild pancreatic atrophy. Cholecystectomy without biliary ductal
dilatation.  Normal adrenal glands.  Normal right kidney.  There is
minimal caliectasis on the left, which is felt to be similar to the
07/04/2008 exam.  No cause identified (although there are extensive
surgical clips along the course of the left ureter).

No retroperitoneal or retrocrural adenopathy.

Majority of the colon is present.  There is a moderate amount of
left-sided colonic stool.  Small bowel loops are again positioned
far anteriorly.  There is no evidence of obstruction.  No ascites
or evidence of peritoneal disease.  Fluid density structure in the
right pelvis on image 60/series 2 is favored to represent
unopacified bowel loop, possibly the site of an end-to-side small
bowel an anastomosis.  This was likely present and slightly less
conspicuous on the 07/04/2008 exam (image 54).  No evidence of
surrounding inflammation to suggest extraluminal fluid collection.
Extensive adjacent surgical changes in the ileum and adjacent
mesentery.

No pelvic adenopathy.    Normal urinary bladder.  Hysterectomy.  No
adnexal mass.  No adnexal mass or significant free fluid.

Bones/Musculoskeletal:  No acute osseous abnormality.
IMPRESSION: 1.  Redemonstration of anterior position of small bowel loops,
suggesting underlying adhesions.  No evidence of bowel obstruction
or other acute complication.
2. Possible constipation.
3.  Minimal left-sided caliectasis, chronic and of doubtful
clinical significance.

## 2015-03-08 DIAGNOSIS — H1013 Acute atopic conjunctivitis, bilateral: Secondary | ICD-10-CM | POA: Diagnosis not present

## 2015-03-12 DIAGNOSIS — M170 Bilateral primary osteoarthritis of knee: Secondary | ICD-10-CM | POA: Diagnosis not present

## 2015-03-18 ENCOUNTER — Telehealth: Payer: Self-pay | Admitting: Endocrinology

## 2015-03-18 DIAGNOSIS — N3001 Acute cystitis with hematuria: Secondary | ICD-10-CM

## 2015-03-18 MED ORDER — OXYCODONE-ACETAMINOPHEN 7.5-325 MG PO TABS: ORAL_TABLET | ORAL | Status: DC

## 2015-03-18 NOTE — Telephone Encounter (Signed)
i printed 

## 2015-03-18 NOTE — Telephone Encounter (Signed)
Pt is having stomach cramps and the pt stated that this has been occuring throughout this weekend.Please advise below

## 2015-03-18 NOTE — Telephone Encounter (Signed)
Patient stated that she was having problems with her stomach, she need a prescription for pain medication.

## 2015-03-18 NOTE — Telephone Encounter (Signed)
Pt has agreed to come pick up medication in office

## 2015-03-29 ENCOUNTER — Ambulatory Visit (INDEPENDENT_AMBULATORY_CARE_PROVIDER_SITE_OTHER): Payer: Medicare Other | Admitting: Endocrinology

## 2015-03-29 VITALS — BP 128/86 | HR 80 | Temp 97.7°F | Ht 65.0 in | Wt 209.0 lb

## 2015-03-29 DIAGNOSIS — Z0000 Encounter for general adult medical examination without abnormal findings: Secondary | ICD-10-CM | POA: Diagnosis not present

## 2015-03-29 DIAGNOSIS — E538 Deficiency of other specified B group vitamins: Secondary | ICD-10-CM

## 2015-03-29 DIAGNOSIS — Z23 Encounter for immunization: Secondary | ICD-10-CM | POA: Diagnosis not present

## 2015-03-29 MED ORDER — CYANOCOBALAMIN 1000 MCG/ML IJ SOLN
1000.0000 ug | Freq: Once | INTRAMUSCULAR | Status: AC
  Administered 2015-03-29: 1000 ug via INTRAMUSCULAR

## 2015-03-29 NOTE — Patient Instructions (Addendum)
your surgical risk is low and outweighed by the potential benefit of the surgery.  You are therefore medically cleared. Please continue the same medications.

## 2015-03-29 NOTE — Progress Notes (Signed)
Subjective:    Patient ID: [name], female    DOB: 08-13-1945, 69 y.o.   MRN: [id number]  HPI Pt will have right TKR on 06/05/15.   pt states she feels well in general.  She takes meds as rx'ed. Past Medical History  Diagnosis Date  . Obesity   . PUD (peptic ulcer disease)   . Leukopenia   . Short bowel syndrome   . Internal hemorrhoids without mention of complication   . Stricture and stenosis of esophagus   . Osteoarthrosis, unspecified whether generalized or localized, unspecified site   . Thyrotoxicosis without mention of goiter or other cause, without mention of thyrotoxic crisis or storm   . Other and unspecified hyperlipidemia   . Gout, unspecified   . Diverticulosis of colon (without mention of hemorrhage)   . C. difficile colitis   . VITAMIN B12 DEFICIENCY 08/30/2009  . GOITER, MULTINODULAR 04/02/2009  . HYPOTHYROIDISM, POST-RADIATION 08/13/2009  . ASYMPTOMATIC POSTMENOPAUSAL STATUS 10/11/2008  . Esophageal reflux 06/12/2008  . PONV (postoperative nausea and vomiting)   . Varicose veins   . Blood transfusion   . UTI (urinary tract infection)   . Kidney stones     "several"  . Unspecified essential hypertension   . Angina   . Shortness of breath on exertion     "sometimes"  . ANEMIA, IRON DEFICIENCY 05/08/2009  . History of lower GI bleeding   . H/O hiatal hernia   . Migraines   . Breast cancer 09/29/11    invasive grade III ductal ca,assoc high grade dcis,ER/PR=neg  . History of radiation therapy 02/08/12-03/25/12    left breast,total 61gy  . Blood transfusion without reported diagnosis   . Hypomagnesemia   . Hypokalemia 05/11/2013  . Gastroparesis   . Type II or unspecified type diabetes mellitus without mention of complication, not stated as uncontrolled     no med in years diet controled    Past Surgical History  Procedure Laterality Date  . Vein ligation and stripping  1980's    Right leg  . Lithotripsy      "4 or 5 times"  . Bunionectomy   1970's    bilateral  . Colonoscopy  2012    multiple   . Esophagogastroduodenoscopy  2011    multiple   . Thyroid ultrasound  12/1994 and 12/1995  . Mastectomy w/ nodes partial  09/29/11    left  . Cholecystectomy  1990's  . Abdominal hysterectomy  1970's    with BSO  . Dilation and curettage of uterus    . Colon surgery      "several surgeries for short bowel syndrome"  . Abdominal adhesion surgery  1980's thru 1990's    "several"  . Kidney stone surgery  1990's    "tried to go up & get it but pushed it further up"  . Portacath placement  09/29/2011    Procedure: INSERTION PORT-A-CATH;  Surgeon: Adin Hector, MD;  Location: [facility];  Service: General;  Laterality: N/A;  . Breast biopsy  08/13/11    left breast lower inner quadrant  . Breast lumpectomy w/ needle localization  09/29/11    left  breast=lymph node,excision benign/ ER/PR=neg, her 2 Positive  . Flexible sigmoidoscopy  2011    multiple   . Port-a-cath removal Right 12/19/2013    Procedure: MINOR REMOVAL PORT-A-CATH;  Surgeon: Adin Hector, MD;  Location: [facility];  Service: General;  Laterality: Right;    Social History  Social History  . Marital Status: Married    Spouse Name: N/A  . Number of Children: 2  . Years of Education: N/A   Occupational History  . RETIRED    Social History Main Topics  . Smoking status: Former Smoker -- 1.00 packs/day for 10 years    Types: Cigarettes    Quit date: 09/22/1985  . Smokeless tobacco: Never Used  . Alcohol Use: No     Comment: 09/29/11 "used to drink socially years ago"  . Drug Use: No  . Sexual Activity: No     Comment: HRT x many yrs   Other Topics Concern  . Not on file   Social History Narrative   Pt gets regular exercise    Current Outpatient Prescriptions on File Prior to Visit  Medication Sig Dispense Refill  . aMILoride (MIDAMOR) 5 MG tablet Take 5 mg by mouth 2 (two) times daily.   11  . calcium carbonate (TUMS - DOSED IN MG  ELEMENTAL CALCIUM) 500 MG chewable tablet Chew 1 tablet by mouth as needed for indigestion or heartburn.    . Calcium Carbonate-Vitamin D (CALCIUM-VITAMIN D) 600-200 MG-UNIT CAPS Take 1 capsule by mouth daily.      . cyanocobalamin (,VITAMIN B-12,) 1000 MCG/ML injection Inject 1,000 mcg into the muscle every 30 (thirty) days.     Marland Kitchen desipramine (NOPRAMIN) 10 MG tablet Take 1 tablet (10 mg total) by mouth at bedtime. 90 tablet 1  . diltiazem 2 % GEL Apply 1 application topically 2 (two) times daily. 30 g 0  . diphenoxylate-atropine (LOMOTIL) 2.5-0.025 MG per tablet Take 2-3 times daily as needed for diarrhea. 30 tablet 0  . ferrous sulfate 325 (65 FE) MG tablet Take 325 mg by mouth daily with breakfast.    . fluorometholone (FML) 0.1 % ophthalmic suspension Place 1 drop into both eyes 2 (two) times daily.    Marland Kitchen levothyroxine (SYNTHROID, LEVOTHROID) 150 MCG tablet TAKE 1 TABLET BY MOUTH DAILY BEFORE BREAKFAST. 90 tablet 1  . loperamide (IMODIUM) 2 MG capsule Take 2 capsules (4 mg total) by mouth every 4 (four) hours as needed for diarrhea or loose stools. 30 capsule 0  . lovastatin (MEVACOR) 20 MG tablet TAKE 1 TABLET (20 MG TOTAL) BY MOUTH DAILY AT 6 PM. 90 tablet 1  . magnesium chloride (SLOW-MAG) 64 MG TBEC SR tablet Take 1 tablet by mouth 3 (three) times daily.    . magnesium oxide (MAG-OX) 400 (241.3 MG) MG tablet Take 400 mg by mouth 3 (three) times daily.   11  . ondansetron (ZOFRAN ODT) 4 MG disintegrating tablet Take 1 tablet (4 mg total) by mouth every 8 (eight) hours as needed for nausea. 10 tablet 0  . oxyCODONE-acetaminophen (PERCOCET) 7.5-325 MG tablet Take 1 tab every 4-6 hours as needed for pain. 100 tablet 0  . pantoprazole (PROTONIX) 40 MG tablet TAKE 1 TABLET 30 MINUTES BEFORE BREAKFAST AND SUPPER 180 tablet 1  . pramoxine-hydrocortisone (ANALPRAM HC) cream Apply topically 2 (two) times daily. 30 g 0  . ranitidine (ZANTAC) 300 MG capsule TAKE ONE CAPSULE BY MOUTH AT BEDTIME 30 capsule  4  . sulfamethoxazole-trimethoprim (BACTRIM DS,SEPTRA DS) 800-160 MG per tablet Take 1 tablet by mouth 2 (two) times daily. 10 tablet 1  . thiamine 100 MG tablet Take 100 mg by mouth daily.      . Vitamin D, Ergocalciferol, (DRISDOL) 50000 UNITS CAPS capsule TAKE 1 TABLET TWICE A WEEK 10 capsule 5  . zolpidem (AMBIEN) 5  MG tablet TAKE 1 TABLET BY MOUTH AT BEDTIME AS NEEDED FOR SLEEP (Patient taking differently: TAKE 1 TABLET BY MOUTH AT BEDTIME.) 30 tablet 5  . calcium gluconate 650 MG tablet Take 1 tablet (650 mg total) by mouth daily. 30 tablet 2   Current Facility-Administered Medications on File Prior to Visit  Medication Dose Route Frequency Provider Last Rate Last Dose  . acetaminophen (TYLENOL) tablet 1,000 mg  1,000 mg Oral Once Amada Kingfisher, MD   1,000 mg at 08/09/13 1658    Allergies  Allergen Reactions  . Aspirin Other (See Comments)    REACTION: Gi Intolerance/ Burning in stomach  . Codeine Itching  . Trazodone And Nefazodone     "sick"  . Flagyl [Metronidazole] Rash  . Iodine Itching    Allergic to IVP dye  . Morphine And Related Itching    Family History  Problem Relation Age of Onset  . Esophageal cancer Son     deceased  . Diabetes Mother   . Heart disease Mother   . Colon cancer Paternal Uncle   . Kidney disease Sister   . Diabetes Father   . Hypertension Father   . Kidney disease Brother     x 3    BP 128/86 mmHg  Pulse 80  Temp(Src) 97.7 F (36.5 C) (Oral)  Ht 5\' 5"  (1.651 m)  Wt 209 lb (94.802 kg)  BMI 34.78 kg/m2  SpO2 99%  Review of Systems Denies chest pain and sob    Objective:   Physical Exam VITAL SIGNS:  See vs page GENERAL: no distress LUNGS:  Clear to auscultation HEART:  Regular rate and rhythm without murmurs noted. Normal S1,S2.     i personally reviewed electrocardiogram tracing (today): Indication: preop Impression: normal    Assessment & Plan:  HTN: well-controlled.   Patient is advised the following: Patient  Instructions  your surgical risk is low and outweighed by the potential benefit of the surgery.  You are therefore medically cleared. Please continue the same medications.

## 2015-04-01 ENCOUNTER — Telehealth: Payer: Self-pay | Admitting: Endocrinology

## 2015-04-01 NOTE — Telephone Encounter (Signed)
Patient stated that she was itching a lot and can you call her something for nerves, please advise

## 2015-04-01 NOTE — Telephone Encounter (Signed)
See note below and please advise, Thanks! 

## 2015-04-01 NOTE — Telephone Encounter (Signed)
please call patient: i have reviewed your med list. The medication by mouth for itching interferes with your other meds. Try applying hydrocortisone cream as needed.

## 2015-04-02 NOTE — Telephone Encounter (Signed)
Patient advised of note below and voiced understanding.  

## 2015-04-04 ENCOUNTER — Ambulatory Visit: Payer: Medicare Other | Admitting: Endocrinology

## 2015-04-17 DIAGNOSIS — M79602 Pain in left arm: Secondary | ICD-10-CM | POA: Diagnosis not present

## 2015-04-21 ENCOUNTER — Emergency Department (HOSPITAL_COMMUNITY): Payer: Medicare Other

## 2015-04-21 ENCOUNTER — Emergency Department (HOSPITAL_COMMUNITY)
Admission: EM | Admit: 2015-04-21 | Discharge: 2015-04-21 | Disposition: A | Discharge status: Home / Self Care | Payer: Medicare Other | Attending: Emergency Medicine | Admitting: Emergency Medicine

## 2015-04-21 ENCOUNTER — Encounter (HOSPITAL_COMMUNITY): Payer: Self-pay | Admitting: Emergency Medicine

## 2015-04-21 DIAGNOSIS — Z79899 Other long term (current) drug therapy: Secondary | ICD-10-CM | POA: Diagnosis not present

## 2015-04-21 DIAGNOSIS — R1013 Epigastric pain: Secondary | ICD-10-CM | POA: Insufficient documentation

## 2015-04-21 DIAGNOSIS — R110 Nausea: Secondary | ICD-10-CM | POA: Diagnosis not present

## 2015-04-21 DIAGNOSIS — Z87891 Personal history of nicotine dependence: Secondary | ICD-10-CM | POA: Insufficient documentation

## 2015-04-21 DIAGNOSIS — I10 Essential (primary) hypertension: Secondary | ICD-10-CM | POA: Diagnosis not present

## 2015-04-21 DIAGNOSIS — Z8619 Personal history of other infectious and parasitic diseases: Secondary | ICD-10-CM | POA: Insufficient documentation

## 2015-04-21 DIAGNOSIS — Z87442 Personal history of urinary calculi: Secondary | ICD-10-CM | POA: Diagnosis not present

## 2015-04-21 DIAGNOSIS — E0590 Thyrotoxicosis, unspecified without thyrotoxic crisis or storm: Secondary | ICD-10-CM | POA: Insufficient documentation

## 2015-04-21 DIAGNOSIS — Z87440 Personal history of urinary (tract) infections: Secondary | ICD-10-CM | POA: Diagnosis not present

## 2015-04-21 DIAGNOSIS — R1032 Left lower quadrant pain: Secondary | ICD-10-CM

## 2015-04-21 DIAGNOSIS — D649 Anemia, unspecified: Secondary | ICD-10-CM | POA: Diagnosis not present

## 2015-04-21 DIAGNOSIS — E039 Hypothyroidism, unspecified: Secondary | ICD-10-CM | POA: Insufficient documentation

## 2015-04-21 DIAGNOSIS — K219 Gastro-esophageal reflux disease without esophagitis: Secondary | ICD-10-CM | POA: Diagnosis not present

## 2015-04-21 DIAGNOSIS — E119 Type 2 diabetes mellitus without complications: Secondary | ICD-10-CM | POA: Insufficient documentation

## 2015-04-21 DIAGNOSIS — Z853 Personal history of malignant neoplasm of breast: Secondary | ICD-10-CM | POA: Diagnosis not present

## 2015-04-21 DIAGNOSIS — E669 Obesity, unspecified: Secondary | ICD-10-CM | POA: Insufficient documentation

## 2015-04-21 DIAGNOSIS — Z8679 Personal history of other diseases of the circulatory system: Secondary | ICD-10-CM | POA: Insufficient documentation

## 2015-04-21 DIAGNOSIS — N3001 Acute cystitis with hematuria: Secondary | ICD-10-CM

## 2015-04-21 DIAGNOSIS — Z7952 Long term (current) use of systemic steroids: Secondary | ICD-10-CM | POA: Insufficient documentation

## 2015-04-21 DIAGNOSIS — E876 Hypokalemia: Secondary | ICD-10-CM | POA: Diagnosis not present

## 2015-04-21 DIAGNOSIS — Z8739 Personal history of other diseases of the musculoskeletal system and connective tissue: Secondary | ICD-10-CM | POA: Insufficient documentation

## 2015-04-21 DIAGNOSIS — Z792 Long term (current) use of antibiotics: Secondary | ICD-10-CM | POA: Diagnosis not present

## 2015-04-21 DIAGNOSIS — E785 Hyperlipidemia, unspecified: Secondary | ICD-10-CM | POA: Diagnosis not present

## 2015-04-21 DIAGNOSIS — R109 Unspecified abdominal pain: Secondary | ICD-10-CM | POA: Diagnosis present

## 2015-04-21 DIAGNOSIS — K4090 Unilateral inguinal hernia, without obstruction or gangrene, not specified as recurrent: Secondary | ICD-10-CM | POA: Diagnosis not present

## 2015-04-21 LAB — COMPREHENSIVE METABOLIC PANEL
ALBUMIN: 3.5 g/dL (ref 3.5–5.0)
ALT: 24 U/L (ref 14–54)
AST: 33 U/L (ref 15–41)
Alkaline Phosphatase: 77 U/L (ref 38–126)
Anion gap: 7 (ref 5–15)
BUN: 14 mg/dL (ref 6–20)
CO2: 24 mmol/L (ref 22–32)
CREATININE: 0.98 mg/dL (ref 0.44–1.00)
Calcium: 9.2 mg/dL (ref 8.9–10.3)
Chloride: 105 mmol/L (ref 101–111)
GFR calc Af Amer: >60 mL/min (ref >60)
GFR calc non Af Amer: 57 mL/min — ABNORMAL LOW (ref >60)
Glucose, Bld: 112 mg/dL — ABNORMAL HIGH (ref 65–99)
POTASSIUM: 4.2 mmol/L (ref 3.5–5.1)
SODIUM: 136 mmol/L (ref 135–145)
TOTAL PROTEIN: 6.3 g/dL — AB (ref 6.5–8.1)
Total Bilirubin: 1.1 mg/dL (ref 0.3–1.2)

## 2015-04-21 LAB — URINE MICROSCOPIC-ADD ON

## 2015-04-21 LAB — CBC WITH DIFFERENTIAL/PLATELET
Basophils Absolute: 0 10*3/uL (ref 0.0–0.1)
Basophils Relative: 0 %
EOS PCT: 1 %
Eosinophils Absolute: 0 10*3/uL (ref 0.0–0.7)
HCT: 32.7 % — ABNORMAL LOW (ref 36.0–46.0)
HEMOGLOBIN: 10.4 g/dL — AB (ref 12.0–15.0)
LYMPHS ABS: 1.6 10*3/uL (ref 0.7–4.0)
LYMPHS PCT: 44 %
MCH: 29.9 pg (ref 26.0–34.0)
MCHC: 31.8 g/dL (ref 30.0–36.0)
MCV: 94 fL (ref 78.0–100.0)
MONOS PCT: 7 %
Monocytes Absolute: 0.3 10*3/uL (ref 0.1–1.0)
NEUTROS PCT: 48 %
Neutro Abs: 1.8 10*3/uL (ref 1.7–7.7)
Platelets: 193 10*3/uL (ref 150–400)
RBC: 3.48 MIL/uL — AB (ref 3.87–5.11)
RDW: 14.1 % (ref 11.5–15.5)
WBC: 3.7 10*3/uL — AB (ref 4.0–10.5)

## 2015-04-21 LAB — LIPASE, BLOOD: LIPASE: 29 U/L (ref 11–51)

## 2015-04-21 LAB — URINALYSIS, ROUTINE W REFLEX MICROSCOPIC
BILIRUBIN URINE: NEGATIVE
Glucose, UA: NEGATIVE mg/dL
Ketones, ur: NEGATIVE mg/dL
Leukocytes, UA: NEGATIVE
Nitrite: NEGATIVE
PROTEIN: NEGATIVE mg/dL
Specific Gravity, Urine: 1.03 (ref 1.005–1.030)
pH: 5 (ref 5.0–8.0)

## 2015-04-21 MED ORDER — OXYCODONE-ACETAMINOPHEN 7.5-325 MG PO TABS: ORAL_TABLET | ORAL | Status: DC

## 2015-04-21 MED ORDER — MORPHINE SULFATE (PF) 2 MG/ML IV SOLN
2.0000 mg | Freq: Once | INTRAVENOUS | Status: AC
  Administered 2015-04-21: 2 mg via INTRAVENOUS
  Filled 2015-04-21: qty 1

## 2015-04-21 MED ORDER — SODIUM CHLORIDE 0.9 % IV BOLUS (SEPSIS): 1000.0000 mL | Freq: Once | INTRAVENOUS | Status: DC

## 2015-04-21 MED ORDER — ONDANSETRON 4 MG PO TBDP: 4.0000 mg | ORAL_TABLET | Freq: Three times a day (TID) | ORAL | Status: DC | PRN

## 2015-04-21 MED ORDER — BARIUM SULFATE 2.1 % PO SUSP
ORAL | Status: AC
  Filled 2015-04-21: qty 2

## 2015-04-21 MED ORDER — OXYCODONE-ACETAMINOPHEN 5-325 MG PO TABS
2.0000 | ORAL_TABLET | Freq: Once | ORAL | Status: AC
  Administered 2015-04-21: 2 via ORAL
  Filled 2015-04-21: qty 2

## 2015-04-21 MED ORDER — ONDANSETRON 4 MG PO TBDP
4.0000 mg | ORAL_TABLET | Freq: Once | ORAL | Status: AC
  Administered 2015-04-21: 4 mg via ORAL
  Filled 2015-04-21: qty 1

## 2015-04-21 NOTE — ED Provider Notes (Signed)
CSN: [id number]     Arrival date & time 04/21/15  1139 History   First MD Initiated Contact with Patient 04/21/15 1156     Chief Complaint  Patient presents with  . Abdominal Pain    left side     (Consider location/radiation/quality/duration/timing/severity/associated sxs/prior Treatment) Patient is a 69 y.o. female presenting with abdominal pain. The history is provided by the patient and medical records. No language interpreter was used.  Abdominal Pain Associated symptoms: nausea   Associated symptoms: no constipation, no cough, no diarrhea, no shortness of breath, no sore throat and no vomiting    [name] is a 69 y.o. female  with a PMH of short bowel, diverticulosis, PUD who presents to the Emergency Department complaining of worsening, crampy LLQ pain with radiation to epigastrium x 2 days. Pt. Has tried Tylenol with no relief of pain. Has daily bowel movements of normal consistency, but states that Friday she had a larger stool than usual - No blood noticed; passing gas; + nausea, no vomiting. No aggravating symptoms.   Past Medical History  Diagnosis Date  . Obesity   . PUD (peptic ulcer disease)   . Leukopenia   . Short bowel syndrome   . Internal hemorrhoids without mention of complication   . Stricture and stenosis of esophagus   . Osteoarthrosis, unspecified whether generalized or localized, unspecified site   . Thyrotoxicosis without mention of goiter or other cause, without mention of thyrotoxic crisis or storm   . Other and unspecified hyperlipidemia   . Gout, unspecified   . Diverticulosis of colon (without mention of hemorrhage)   . C. difficile colitis   . VITAMIN B12 DEFICIENCY 08/30/2009  . GOITER, MULTINODULAR 04/02/2009  . HYPOTHYROIDISM, POST-RADIATION 08/13/2009  . ASYMPTOMATIC POSTMENOPAUSAL STATUS 10/11/2008  . Esophageal reflux 06/12/2008  . PONV (postoperative nausea and vomiting)   . Varicose veins   . Blood transfusion   . UTI (urinary  tract infection)   . Kidney stones     "several"  . Unspecified essential hypertension   . Angina   . Shortness of breath on exertion     "sometimes"  . ANEMIA, IRON DEFICIENCY 05/08/2009  . History of lower GI bleeding   . H/O hiatal hernia   . Migraines   . Breast cancer ([place]) 09/29/11    invasive grade III ductal ca,assoc high grade dcis,ER/PR=neg  . History of radiation therapy 02/08/12-03/25/12    left breast,total 61gy  . Blood transfusion without reported diagnosis   . Hypomagnesemia   . Hypokalemia 05/11/2013  . Gastroparesis   . Type II or unspecified type diabetes mellitus without mention of complication, not stated as uncontrolled     no med in years diet controled   Past Surgical History  Procedure Laterality Date  . Vein ligation and stripping  1980's    Right leg  . Lithotripsy      "4 or 5 times"  . Bunionectomy  1970's    bilateral  . Colonoscopy  2012    multiple   . Esophagogastroduodenoscopy  2011    multiple   . Thyroid ultrasound  12/1994 and 12/1995  . Mastectomy w/ nodes partial  09/29/11    left  . Cholecystectomy  1990's  . Abdominal hysterectomy  1970's    with BSO  . Dilation and curettage of uterus    . Colon surgery      "several surgeries for short bowel syndrome"  . Abdominal adhesion surgery  1980's thru  1990's    "several"  . Kidney stone surgery  1990's    "tried to go up & get it but pushed it further up"  . Portacath placement  09/29/2011    Procedure: INSERTION PORT-A-CATH;  Surgeon: Adin Hector, MD;  Location: [facility];  Service: General;  Laterality: N/A;  . Breast biopsy  08/13/11    left breast lower inner quadrant  . Breast lumpectomy w/ needle localization  09/29/11    left  breast=lymph node,excision benign/ ER/PR=neg, her 2 Positive  . Flexible sigmoidoscopy  2011    multiple   . Port-a-cath removal Right 12/19/2013    Procedure: MINOR REMOVAL PORT-A-CATH;  Surgeon: Adin Hector, MD;  Location: [facility];   Service: General;  Laterality: Right;   Family History  Problem Relation Age of Onset  . Esophageal cancer Son     deceased  . Diabetes Mother   . Heart disease Mother   . Colon cancer Paternal Uncle   . Kidney disease Sister   . Diabetes Father   . Hypertension Father   . Kidney disease Brother     x 3   Social History  Substance Use Topics  . Smoking status: Former Smoker -- 1.00 packs/day for 10 years    Types: Cigarettes    Quit date: 09/22/1985  . Smokeless tobacco: Never Used  . Alcohol Use: No     Comment: 09/29/11 "used to drink socially years ago"   OB History    No data available     Review of Systems  Constitutional: Negative.   HENT: Negative for congestion, rhinorrhea and sore throat.   Eyes: Negative for visual disturbance.  Respiratory: Negative for cough, shortness of breath and wheezing.   Cardiovascular: Negative.   Gastrointestinal: Positive for nausea and abdominal pain. Negative for vomiting, diarrhea, constipation and blood in stool.  Endocrine: Negative for polydipsia and polyuria.  Musculoskeletal: Negative for myalgias, back pain and arthralgias.  Skin: Negative for rash.  Neurological: Negative for dizziness, weakness and headaches.      Allergies  Aspirin; Codeine; Trazodone and nefazodone; Flagyl; Iodine; and Morphine and related  Home Medications   Prior to Admission medications   Medication Sig Start Date End Date Taking? Authorizing Provider  aMILoride (MIDAMOR) 5 MG tablet Take 5 mg by mouth 2 (two) times daily.  06/11/14   Historical Provider, MD  calcium carbonate (TUMS - DOSED IN MG ELEMENTAL CALCIUM) 500 MG chewable tablet Chew 1 tablet by mouth as needed for indigestion or heartburn.    Historical Provider, MD  Calcium Carbonate-Vitamin D (CALCIUM-VITAMIN D) 600-200 MG-UNIT CAPS Take 1 capsule by mouth daily.      Historical Provider, MD  calcium gluconate 650 MG tablet Take 1 tablet (650 mg total) by mouth daily. 01/14/12 11/22/14   Consuela Mimes, MD  cyanocobalamin (,VITAMIN B-12,) 1000 MCG/ML injection Inject 1,000 mcg into the muscle every 30 (thirty) days.     Historical Provider, MD  desipramine (NOPRAMIN) 10 MG tablet Take 1 tablet (10 mg total) by mouth at bedtime. 04/23/14   Renato Shin, MD  diltiazem 2 % GEL Apply 1 application topically 2 (two) times daily. 05/02/14   Laban Emperor Zehr, PA-C  diphenoxylate-atropine (LOMOTIL) 2.5-0.025 MG per tablet Take 2-3 times daily as needed for diarrhea. 02/14/14   Willia Craze, NP  ferrous sulfate 325 (65 FE) MG tablet Take 325 mg by mouth daily with breakfast.    Historical Provider, MD  fluorometholone (FML) 0.1 %  ophthalmic suspension Place 1 drop into both eyes 2 (two) times daily. 03/14/13   Historical Provider, MD  levothyroxine (SYNTHROID, LEVOTHROID) 150 MCG tablet TAKE 1 TABLET BY MOUTH DAILY BEFORE BREAKFAST. 01/24/15   Renato Shin, MD  loperamide (IMODIUM) 2 MG capsule Take 2 capsules (4 mg total) by mouth every 4 (four) hours as needed for diarrhea or loose stools. 01/16/14   Renato Shin, MD  lovastatin (MEVACOR) 20 MG tablet TAKE 1 TABLET (20 MG TOTAL) BY MOUTH DAILY AT 6 PM. 01/25/15   Renato Shin, MD  magnesium chloride (SLOW-MAG) 64 MG TBEC SR tablet Take 1 tablet by mouth 3 (three) times daily.    Historical Provider, MD  magnesium oxide (MAG-OX) 400 (241.3 MG) MG tablet Take 400 mg by mouth 3 (three) times daily.  08/06/14   Historical Provider, MD  ondansetron (ZOFRAN ODT) 4 MG disintegrating tablet Take 1 tablet (4 mg total) by mouth every 8 (eight) hours as needed for nausea. 11/22/14   Larene Pickett, PA-C  oxyCODONE-acetaminophen (PERCOCET) 7.5-325 MG tablet Take 1 tab every 4-6 hours as needed for pain. 03/18/15   Renato Shin, MD  pantoprazole (PROTONIX) 40 MG tablet TAKE 1 TABLET 30 MINUTES BEFORE BREAKFAST AND SUPPER 01/25/15   Ladene Artist, MD  pramoxine-hydrocortisone [facility]) cream Apply topically 2 (two) times daily. 05/02/14   Laban Emperor Zehr,  PA-C  ranitidine (ZANTAC) 300 MG capsule TAKE ONE CAPSULE BY MOUTH AT BEDTIME 09/24/14   Ladene Artist, MD  sulfamethoxazole-trimethoprim (BACTRIM DS,SEPTRA DS) 800-160 MG per tablet Take 1 tablet by mouth 2 (two) times daily. 11/18/14   Janith Lima, MD  thiamine 100 MG tablet Take 100 mg by mouth daily.      Historical Provider, MD  Vitamin D, Ergocalciferol, (DRISDOL) 50000 UNITS CAPS capsule TAKE 1 TABLET TWICE A WEEK 02/11/15   Renato Shin, MD  zolpidem (AMBIEN) 5 MG tablet TAKE 1 TABLET BY MOUTH AT BEDTIME AS NEEDED FOR SLEEP Patient taking differently: TAKE 1 TABLET BY MOUTH AT BEDTIME. 10/16/14   Renato Shin, MD   BP 98/53 mmHg  Pulse 62  Temp(Src) 97.6 F (36.4 C)  Resp 16  Ht 5' 5.5" (1.664 m)  Wt 208 lb 3 oz (94.433 kg)  BMI 34.10 kg/m2  SpO2 100% Physical Exam  Constitutional: She is oriented to person, place, and time. She appears well-developed and well-nourished.  Alert and in no acute distress  HENT:  Head: Normocephalic and atraumatic.  Cardiovascular: Normal rate, regular rhythm, normal heart sounds and intact distal pulses.  Exam reveals no gallop and no friction rub.   No murmur heard. Pulmonary/Chest: Effort normal and breath sounds normal. No respiratory distress. She has no wheezes. She has no rales. She exhibits no tenderness.  Abdominal: She exhibits no mass. There is no rebound and no guarding.  Abdomen soft, non-distended Tenderness to palpation of LLQ (worst) and mild TTP of epigastrium.  Bowel sounds positive in all four quadrants  Musculoskeletal: She exhibits no edema.  Neurological: She is alert and oriented to person, place, and time.  Skin: Skin is warm and dry. No rash noted.  Psychiatric: She has a normal mood and affect. Her behavior is normal. Judgment and thought content normal.  Nursing note and vitals reviewed.   ED Course  Procedures (including critical care time) Labs Review Labs Reviewed  COMPREHENSIVE METABOLIC PANEL - Abnormal;  Notable for the following:    Glucose, Bld 112 (*)    Total Protein 6.3 (*)  GFR calc non Af Amer 57 (*)    All other components within normal limits  CBC WITH DIFFERENTIAL/PLATELET - Abnormal; Notable for the following:    WBC 3.7 (*)    RBC 3.48 (*)    Hemoglobin 10.4 (*)    HCT 32.7 (*)    All other components within normal limits  LIPASE, BLOOD  CBC WITH DIFFERENTIAL/PLATELET  URINALYSIS, ROUTINE W REFLEX MICROSCOPIC (NOT AT [facility])    Imaging Review Ct Abdomen Pelvis Wo Contrast  04/21/2015  CLINICAL DATA:  Left-sided abdominal pain for 2 days. Nausea. Past medical history of bowel obstruction. EXAM: CT ABDOMEN AND PELVIS WITHOUT CONTRAST TECHNIQUE: Multidetector CT imaging of the abdomen and pelvis was performed following the standard protocol without IV contrast.Oral contrast was given. Intravenous contrast was not administered due to iodine allergy. COMPARISON:  11/22/2014 FINDINGS: Lower chest: The lung bases are clear. There is a small hiatal hernia. Hepatobiliary: No mass visualized on this un-enhanced exam. There has been a prior cholecystectomy. Pancreas: No mass or inflammatory process identified on this un-enhanced exam. Spleen: Within normal limits in size. Adrenals/Urinary Tract: No evidence of urolithiasis or hydronephrosis. No definite mass visualized on this un-enhanced exam. Stomach/Bowel: No evidence of bowel obstruction. Postsurgical changes from prior abdominal surgery are seen with thickening of the anterior peritoneum and adhesion of several small and large bowel loops to the anterior abdominal wall in the infraumbilical abdomen. Multiple postsurgical clips are seen in the pelvis. There is a 4.3 by 1.6 cm mesenteric soft tissue thickening adjacent to the areas of scarring, at the site of previous focal fluid collection. Vascular/Lymphatic: No pathologically enlarged lymph nodes. No evidence of abdominal aortic aneurysm. Reproductive: Status post hysterectomy. Other: None.  Musculoskeletal:  No suspicious bone lesions identified. IMPRESSION: Largely stable postsurgical findings in the lower abdomen/ pelvis with thickening of the anterior peritoneum and adhesion of small and large bowel loops, without evidence of obstruction. Residual soft tissue thickening in the right lower abdomen, at the site of previously demonstrated fluid collection. No evidence of abnormalities within the solid organs of the abdomen, accounting for lack of IV contrast. Small hiatal hernia. Electronically Signed   By: Fidela Salisbury M.D.   On: 04/21/2015 15:41   I have personally reviewed and evaluated these images and lab results as part of my medical decision-making.   EKG Interpretation None      MDM   Final diagnoses:  LLQ pain   [name] presents with LLQ pain; history of short bowel, diverticulosis, gerd Concern for diverticulitis, colitis; obstruction unlikely - no vomiting and having daily formed BM's.   Labs: CBC - 3.7>10.4/32.7<193, CMP reassuring, lipase wdl CT abd shows no evidence of abdominal abnormalities.  Urine pending at shift change; Care assumed by Dr. Darl Householder  Who saw patient. Case discussed, plan agreed upon.    Rachel Almond Ryleeann Urquiza, PA-C 04/21/15 1612  Wandra Arthurs, MD 04/21/15 [phone]

## 2015-04-21 NOTE — ED Notes (Signed)
Patient transported to CT 

## 2015-04-21 NOTE — ED Notes (Signed)
Pt. Stated, I've had left side pain that started on Friday.  Just Pain.  I have bowel problems anyway.

## 2015-04-21 NOTE — ED Notes (Signed)
Lab called stating first CBC that was drawn was hemolyzed.

## 2015-04-21 NOTE — Discharge Instructions (Signed)
Stay hydrated.   Take zofran for nausea.  Take percocet for pain.   See your doctor.   Return to ER if you have severe abdominal pain, vomiting, fevers, not passing gas.

## 2015-04-23 ENCOUNTER — Ambulatory Visit (INDEPENDENT_AMBULATORY_CARE_PROVIDER_SITE_OTHER): Payer: Medicare Other | Admitting: Endocrinology

## 2015-04-23 ENCOUNTER — Encounter: Payer: Self-pay | Admitting: Endocrinology

## 2015-04-23 VITALS — BP 122/84 | Temp 97.8°F | Ht 65.0 in | Wt 209.0 lb

## 2015-04-23 DIAGNOSIS — E538 Deficiency of other specified B group vitamins: Secondary | ICD-10-CM

## 2015-04-23 DIAGNOSIS — N3001 Acute cystitis with hematuria: Secondary | ICD-10-CM

## 2015-04-23 MED ORDER — CYANOCOBALAMIN 1000 MCG/ML IJ SOLN
1000.0000 ug | Freq: Once | INTRAMUSCULAR | Status: AC
  Administered 2015-04-23: 1000 ug via INTRAMUSCULAR

## 2015-04-23 MED ORDER — OXYCODONE-ACETAMINOPHEN 7.5-325 MG PO TABS: ORAL_TABLET | ORAL | Status: DC

## 2015-04-23 MED ORDER — GABAPENTIN 300 MG PO CAPS: 300.0000 mg | ORAL_CAPSULE | Freq: Three times a day (TID) | ORAL | Status: DC

## 2015-04-23 NOTE — Progress Notes (Signed)
Subjective:    Patient ID: [name], female    DOB: 1945/06/07, 69 y.o.   MRN: [id number]  HPI Pt has many years of intermittent moderate pain throughout the abdomen.  She was recently seen in ER for exacerbation of LLQ pain.  No assoc BRBPR.  She has intermittent flatulence.   Past Medical History  Diagnosis Date  . Obesity   . PUD (peptic ulcer disease)   . Leukopenia   . Short bowel syndrome   . Internal hemorrhoids without mention of complication   . Stricture and stenosis of esophagus   . Osteoarthrosis, unspecified whether generalized or localized, unspecified site   . Thyrotoxicosis without mention of goiter or other cause, without mention of thyrotoxic crisis or storm   . Other and unspecified hyperlipidemia   . Gout, unspecified   . Diverticulosis of colon (without mention of hemorrhage)   . C. difficile colitis   . VITAMIN B12 DEFICIENCY 08/30/2009  . GOITER, MULTINODULAR 04/02/2009  . HYPOTHYROIDISM, POST-RADIATION 08/13/2009  . ASYMPTOMATIC POSTMENOPAUSAL STATUS 10/11/2008  . Esophageal reflux 06/12/2008  . PONV (postoperative nausea and vomiting)   . Varicose veins   . Blood transfusion   . UTI (urinary tract infection)   . Kidney stones     "several"  . Unspecified essential hypertension   . Angina   . Shortness of breath on exertion     "sometimes"  . ANEMIA, IRON DEFICIENCY 05/08/2009  . History of lower GI bleeding   . H/O hiatal hernia   . Migraines   . Breast cancer ([place]) 09/29/11    invasive grade III ductal ca,assoc high grade dcis,ER/PR=neg  . History of radiation therapy 02/08/12-03/25/12    left breast,total 61gy  . Blood transfusion without reported diagnosis   . Hypomagnesemia   . Hypokalemia 05/11/2013  . Gastroparesis   . Type II or unspecified type diabetes mellitus without mention of complication, not stated as uncontrolled     no med in years diet controled    Past Surgical History  Procedure Laterality Date  . Vein ligation and  stripping  1980's    Right leg  . Lithotripsy      "4 or 5 times"  . Bunionectomy  1970's    bilateral  . Colonoscopy  2012    multiple   . Esophagogastroduodenoscopy  2011    multiple   . Thyroid ultrasound  12/1994 and 12/1995  . Mastectomy w/ nodes partial  09/29/11    left  . Cholecystectomy  1990's  . Abdominal hysterectomy  1970's    with BSO  . Dilation and curettage of uterus    . Colon surgery      "several surgeries for short bowel syndrome"  . Abdominal adhesion surgery  1980's thru 1990's    "several"  . Kidney stone surgery  1990's    "tried to go up & get it but pushed it further up"  . Portacath placement  09/29/2011    Procedure: INSERTION PORT-A-CATH;  Surgeon: Adin Hector, MD;  Location: [place];  Service: General;  Laterality: N/A;  . Breast biopsy  08/13/11    left breast lower inner quadrant  . Breast lumpectomy w/ needle localization  09/29/11    left  breast=lymph node,excision benign/ ER/PR=neg, her 2 Positive  . Flexible sigmoidoscopy  2011    multiple   . Port-a-cath removal Right 12/19/2013    Procedure: MINOR REMOVAL PORT-A-CATH;  Surgeon: Adin Hector, MD;  Location: [facility];  Service: General;  Laterality: Right;    Social History   Social History  . Marital Status: Married    Spouse Name: N/A  . Number of Children: 2  . Years of Education: N/A   Occupational History  . RETIRED    Social History Main Topics  . Smoking status: Former Smoker -- 1.00 packs/day for 10 years    Types: Cigarettes    Quit date: 09/22/1985  . Smokeless tobacco: Never Used  . Alcohol Use: No     Comment: 09/29/11 "used to drink socially years ago"  . Drug Use: No  . Sexual Activity: No     Comment: HRT x many yrs   Other Topics Concern  . Not on file   Social History Narrative   Pt gets regular exercise    Current Outpatient Prescriptions on File Prior to Visit  Medication Sig Dispense Refill  . aMILoride (MIDAMOR) 5 MG tablet  Take 5 mg by mouth 2 (two) times daily.   11  . calcium carbonate (TUMS - DOSED IN MG ELEMENTAL CALCIUM) 500 MG chewable tablet Chew 1 tablet by mouth as needed for indigestion or heartburn.    . Calcium Carbonate-Vitamin D (CALCIUM-VITAMIN D) 600-200 MG-UNIT CAPS Take 1 capsule by mouth daily.      . cyanocobalamin (,VITAMIN B-12,) 1000 MCG/ML injection Inject 1,000 mcg into the muscle every 30 (thirty) days.     Marland Kitchen diltiazem 2 % GEL Apply 1 application topically 2 (two) times daily. 30 g 0  . ferrous sulfate 325 (65 FE) MG tablet Take 325 mg by mouth daily with breakfast.    . fluorometholone (FML) 0.1 % ophthalmic suspension Place 1 drop into both eyes 2 (two) times daily.    Marland Kitchen levothyroxine (SYNTHROID, LEVOTHROID) 150 MCG tablet TAKE 1 TABLET BY MOUTH DAILY BEFORE BREAKFAST. 90 tablet 1  . loperamide (IMODIUM) 2 MG capsule Take 2 capsules (4 mg total) by mouth every 4 (four) hours as needed for diarrhea or loose stools. 30 capsule 0  . lovastatin (MEVACOR) 20 MG tablet TAKE 1 TABLET (20 MG TOTAL) BY MOUTH DAILY AT 6 PM. 90 tablet 1  . magnesium chloride (SLOW-MAG) 64 MG TBEC SR tablet Take 1 tablet by mouth 3 (three) times daily.    . magnesium oxide (MAG-OX) 400 (241.3 MG) MG tablet Take 400 mg by mouth 3 (three) times daily.   11  . ondansetron (ZOFRAN ODT) 4 MG disintegrating tablet Take 1 tablet (4 mg total) by mouth every 8 (eight) hours as needed for nausea. 10 tablet 0  . pantoprazole (PROTONIX) 40 MG tablet TAKE 1 TABLET 30 MINUTES BEFORE BREAKFAST AND SUPPER 180 tablet 1  . pramoxine-hydrocortisone (ANALPRAM HC) cream Apply topically 2 (two) times daily. 30 g 0  . ranitidine (ZANTAC) 300 MG capsule TAKE ONE CAPSULE BY MOUTH AT BEDTIME 30 capsule 4  . thiamine 100 MG tablet Take 100 mg by mouth daily.      . Vitamin D, Ergocalciferol, (DRISDOL) 50000 UNITS CAPS capsule TAKE 1 TABLET TWICE A WEEK 10 capsule 5  . zolpidem (AMBIEN) 5 MG tablet TAKE 1 TABLET BY MOUTH AT BEDTIME AS NEEDED FOR  SLEEP (Patient taking differently: TAKE 1 TABLET BY MOUTH AT BEDTIME.) 30 tablet 5  . calcium gluconate 650 MG tablet Take 1 tablet (650 mg total) by mouth daily. 30 tablet 2   Current Facility-Administered Medications on File Prior to Visit  Medication Dose Route Frequency Provider Last Rate Last Dose  .  acetaminophen (TYLENOL) tablet 1,000 mg  1,000 mg Oral Once Amada Kingfisher, MD   1,000 mg at 08/09/13 1658    Allergies  Allergen Reactions  . Aspirin Other (See Comments)    REACTION: Gi Intolerance/ Burning in stomach  . Codeine Itching  . Trazodone And Nefazodone     "sick"  . Flagyl [Metronidazole] Rash  . Iodine Itching    Allergic to IVP dye  . Morphine And Related Itching    Family History  Problem Relation Age of Onset  . Esophageal cancer Son     deceased  . Diabetes Mother   . Heart disease Mother   . Colon cancer Paternal Uncle   . Kidney disease Sister   . Diabetes Father   . Hypertension Father   . Kidney disease Brother     x 3    BP 122/84 mmHg  Temp(Src) 97.8 F (36.6 C) (Oral)  Ht 5\' 5"  (1.651 m)  Wt 209 lb (94.802 kg)  BMI 34.78 kg/m2  SpO2 99%  Review of Systems Denies weight change and hematuria.    Objective:   Physical Exam VITAL SIGNS:  See vs page GENERAL: no distress ABDOMEN: abdomen is soft, nontender.  no hepatosplenomegaly.  not distended.  no hernia.     Lab Results  Component Value Date   WBC 3.7* 04/21/2015   HGB 10.4* 04/21/2015   HCT 32.7* 04/21/2015   MCV 94.0 04/21/2015   PLT 193 04/21/2015   Lab Results  Component Value Date   ALT 24 04/21/2015   AST 33 04/21/2015   ALKPHOS 77 04/21/2015   BILITOT 1.1 04/21/2015       Assessment & Plan:  Chronic abd pain, worse.  She has had many surgeries.  I discussed with patient.  We agree that she should undergo further surgery only if absolutely necessary.    Patient is advised the following: Patient Instructions  i have sent a prescription to your pharmacy, to add  gabapentin.  This will help the oxycodone work better. We have to increase this with time, so please call next week, if you have the pain. Here is a refill of the oxycodone.

## 2015-04-23 NOTE — Patient Instructions (Addendum)
i have sent a prescription to your pharmacy, to add gabapentin.  This will help the oxycodone work better. We have to increase this with time, so please call next week, if you have the pain. Here is a refill of the oxycodone.

## 2015-04-29 ENCOUNTER — Ambulatory Visit: Payer: Medicare Other

## 2015-04-29 ENCOUNTER — Ambulatory Visit: Payer: Medicare Other | Admitting: Endocrinology

## 2015-05-06 ENCOUNTER — Encounter: Payer: Self-pay | Admitting: Endocrinology

## 2015-05-06 ENCOUNTER — Ambulatory Visit (INDEPENDENT_AMBULATORY_CARE_PROVIDER_SITE_OTHER): Payer: Medicare Other | Admitting: Endocrinology

## 2015-05-06 ENCOUNTER — Telehealth: Payer: Self-pay | Admitting: Gastroenterology

## 2015-05-06 VITALS — BP 132/87 | HR 85 | Temp 98.0°F | Ht 65.0 in | Wt 217.0 lb

## 2015-05-06 DIAGNOSIS — R1011 Right upper quadrant pain: Secondary | ICD-10-CM | POA: Diagnosis not present

## 2015-05-06 DIAGNOSIS — E559 Vitamin D deficiency, unspecified: Secondary | ICD-10-CM

## 2015-05-06 MED ORDER — ONDANSETRON 4 MG PO TBDP
4.0000 mg | ORAL_TABLET | Freq: Three times a day (TID) | ORAL | Status: DC | PRN
Start: 2015-05-06 — End: 2015-09-24

## 2015-05-06 NOTE — Telephone Encounter (Signed)
Patient reports diarrhea, nausea and vomiting that started last night.  She is advised that she should push fluids, bland diet, and use imodium for diarrhea.  She is given an appt for Thursday.  She will call back and cancel if her symptoms improve by then.

## 2015-05-06 NOTE — Progress Notes (Signed)
Subjective:    Patient ID: [name], female    DOB: 05-13-1946, 69 y.o.   MRN: [id number]  HPI Pt states 1 week of moderate pain at the left flank, and assoc watery diarrhea.  No recent antibiotics.  Yesterday, she had a few episodes of vomiting.  Past Medical History  Diagnosis Date  . Obesity   . PUD (peptic ulcer disease)   . Leukopenia   . Short bowel syndrome   . Internal hemorrhoids without mention of complication   . Stricture and stenosis of esophagus   . Osteoarthrosis, unspecified whether generalized or localized, unspecified site   . Thyrotoxicosis without mention of goiter or other cause, without mention of thyrotoxic crisis or storm   . Other and unspecified hyperlipidemia   . Gout, unspecified   . Diverticulosis of colon (without mention of hemorrhage)   . C. difficile colitis   . VITAMIN B12 DEFICIENCY 08/30/2009  . GOITER, MULTINODULAR 04/02/2009  . HYPOTHYROIDISM, POST-RADIATION 08/13/2009  . ASYMPTOMATIC POSTMENOPAUSAL STATUS 10/11/2008  . Esophageal reflux 06/12/2008  . PONV (postoperative nausea and vomiting)   . Varicose veins   . Blood transfusion   . UTI (urinary tract infection)   . Kidney stones     "several"  . Unspecified essential hypertension   . Angina   . Shortness of breath on exertion     "sometimes"  . ANEMIA, IRON DEFICIENCY 05/08/2009  . History of lower GI bleeding   . H/O hiatal hernia   . Migraines   . Breast cancer ([place]) 09/29/11    invasive grade III ductal ca,assoc high grade dcis,ER/PR=neg  . History of radiation therapy 02/08/12-03/25/12    left breast,total 61gy  . Blood transfusion without reported diagnosis   . Hypomagnesemia   . Hypokalemia 05/11/2013  . Gastroparesis   . Type II or unspecified type diabetes mellitus without mention of complication, not stated as uncontrolled     no med in years diet controled    Past Surgical History  Procedure Laterality Date  . Vein ligation and stripping  1980's    Right leg    . Lithotripsy      "4 or 5 times"  . Bunionectomy  1970's    bilateral  . Colonoscopy  2012    multiple   . Esophagogastroduodenoscopy  2011    multiple   . Thyroid ultrasound  12/1994 and 12/1995  . Mastectomy w/ nodes partial  09/29/11    left  . Cholecystectomy  1990's  . Abdominal hysterectomy  1970's    with BSO  . Dilation and curettage of uterus    . Colon surgery      "several surgeries for short bowel syndrome"  . Abdominal adhesion surgery  1980's thru 1990's    "several"  . Kidney stone surgery  1990's    "tried to go up & get it but pushed it further up"  . Portacath placement  09/29/2011    Procedure: INSERTION PORT-A-CATH;  Surgeon: Adin Hector, MD;  Location: [facility];  Service: General;  Laterality: N/A;  . Breast biopsy  08/13/11    left breast lower inner quadrant  . Breast lumpectomy w/ needle localization  09/29/11    left  breast=lymph node,excision benign/ ER/PR=neg, her 2 Positive  . Flexible sigmoidoscopy  2011    multiple   . Port-a-cath removal Right 12/19/2013    Procedure: MINOR REMOVAL PORT-A-CATH;  Surgeon: Adin Hector, MD;  Location: [facility];  Service:  General;  Laterality: Right;    Social History   Social History  . Marital Status: Married    Spouse Name: N/A  . Number of Children: 2  . Years of Education: N/A   Occupational History  . RETIRED    Social History Main Topics  . Smoking status: Former Smoker -- 1.00 packs/day for 10 years    Types: Cigarettes    Quit date: 09/22/1985  . Smokeless tobacco: Never Used  . Alcohol Use: No     Comment: 09/29/11 "used to drink socially years ago"  . Drug Use: No  . Sexual Activity: No     Comment: HRT x many yrs   Other Topics Concern  . Not on file   Social History Narrative   Pt gets regular exercise    Current Outpatient Prescriptions on File Prior to Visit  Medication Sig Dispense Refill  . aMILoride (MIDAMOR) 5 MG tablet Take 5 mg by mouth 2 (two) times  daily.   11  . calcium carbonate (TUMS - DOSED IN MG ELEMENTAL CALCIUM) 500 MG chewable tablet Chew 1 tablet by mouth as needed for indigestion or heartburn.    . Calcium Carbonate-Vitamin D (CALCIUM-VITAMIN D) 600-200 MG-UNIT CAPS Take 1 capsule by mouth daily.      . cyanocobalamin (,VITAMIN B-12,) 1000 MCG/ML injection Inject 1,000 mcg into the muscle every 30 (thirty) days.     Marland Kitchen diltiazem 2 % GEL Apply 1 application topically 2 (two) times daily. 30 g 0  . ferrous sulfate 325 (65 FE) MG tablet Take 325 mg by mouth daily with breakfast.    . fluorometholone (FML) 0.1 % ophthalmic suspension Place 1 drop into both eyes 2 (two) times daily.    Marland Kitchen gabapentin (NEURONTIN) 300 MG capsule Take 1 capsule (300 mg total) by mouth 3 (three) times daily. 90 capsule 3  . levothyroxine (SYNTHROID, LEVOTHROID) 150 MCG tablet TAKE 1 TABLET BY MOUTH DAILY BEFORE BREAKFAST. 90 tablet 1  . loperamide (IMODIUM) 2 MG capsule Take 2 capsules (4 mg total) by mouth every 4 (four) hours as needed for diarrhea or loose stools. 30 capsule 0  . lovastatin (MEVACOR) 20 MG tablet TAKE 1 TABLET (20 MG TOTAL) BY MOUTH DAILY AT 6 PM. 90 tablet 1  . magnesium chloride (SLOW-MAG) 64 MG TBEC SR tablet Take 1 tablet by mouth 3 (three) times daily.    . magnesium oxide (MAG-OX) 400 (241.3 MG) MG tablet Take 400 mg by mouth 3 (three) times daily.   11  . oxyCODONE-acetaminophen (PERCOCET) 7.5-325 MG tablet Take 1 tab every 4-6 hours as needed for pain. 100 tablet 0  . pantoprazole (PROTONIX) 40 MG tablet TAKE 1 TABLET 30 MINUTES BEFORE BREAKFAST AND SUPPER 180 tablet 1  . pramoxine-hydrocortisone (ANALPRAM HC) cream Apply topically 2 (two) times daily. 30 g 0  . ranitidine (ZANTAC) 300 MG capsule TAKE ONE CAPSULE BY MOUTH AT BEDTIME 30 capsule 4  . thiamine 100 MG tablet Take 100 mg by mouth daily.      . Vitamin D, Ergocalciferol, (DRISDOL) 50000 UNITS CAPS capsule TAKE 1 TABLET TWICE A WEEK 10 capsule 5  . zolpidem (AMBIEN) 5 MG  tablet TAKE 1 TABLET BY MOUTH AT BEDTIME AS NEEDED FOR SLEEP (Patient taking differently: TAKE 1 TABLET BY MOUTH AT BEDTIME.) 30 tablet 5  . calcium gluconate 650 MG tablet Take 1 tablet (650 mg total) by mouth daily. 30 tablet 2   Current Facility-Administered Medications on File Prior to Visit  Medication Dose Route Frequency Provider Last Rate Last Dose  . acetaminophen (TYLENOL) tablet 1,000 mg  1,000 mg Oral Once Amada Kingfisher, MD   1,000 mg at 08/09/13 1658    Allergies  Allergen Reactions  . Aspirin Other (See Comments)    REACTION: Gi Intolerance/ Burning in stomach  . Codeine Itching  . Trazodone And Nefazodone     "sick"  . Flagyl [Metronidazole] Rash  . Iodine Itching    Allergic to IVP dye  . Morphine And Related Itching    Family History  Problem Relation Age of Onset  . Esophageal cancer Son     deceased  . Diabetes Mother   . Heart disease Mother   . Colon cancer Paternal Uncle   . Kidney disease Sister   . Diabetes Father   . Hypertension Father   . Kidney disease Brother     x 3    BP 132/87 mmHg  Pulse 85  Temp(Src) 98 F (36.7 C) (Oral)  Ht 5\' 5"  (1.651 m)  Wt 217 lb (98.431 kg)  BMI 36.11 kg/m2  SpO2 98%  Review of Systems She has intermittent fecal incontinence.  She has dysuria, but no hematuria.      Objective:   Physical Exam VITAL SIGNS:  See vs page GENERAL: no distress ABDOMEN: abdomen is soft, nontender.  no hepatosplenomegaly.  not distended.  no hernia.    UA is pos for UTI Lab Results  Component Value Date   CREATININE 1.02 05/06/2015   BUN 14 05/06/2015   NA 136 05/06/2015   K 4.5 05/06/2015   CL 102 05/06/2015   CO2 26 05/06/2015   Lab Results  Component Value Date   WBC 4.5 05/06/2015   HGB 10.7* 05/06/2015   HCT 33.1* 05/06/2015   MCV 94.7 05/06/2015   PLT 265.0 05/06/2015       Assessment & Plan:  UTI: new Gastroenteritis, chronic exacerbation.  Anemia: stable.   Patient is advised the following: Patient  Instructions  blood and urine tests are requested for you today.  We'll let you know about the results.   i have sent a prescription to your pharmacy, to refill the nausea pill.   I hope you feel better soon.  If you don't feel better by next week, please call back.  Please call sooner if you get worse.  Please stop taking the magnesium pill for a few days, as stopping this can help the diarrhea.   drink plenty of fluids.   Addendum: i have sent a prescription to your pharmacy, for ceftin.

## 2015-05-06 NOTE — Patient Instructions (Addendum)
blood and urine tests are requested for you today.  We'll let you know about the results.   i have sent a prescription to your pharmacy, to refill the nausea pill.   I hope you feel better soon.  If you don't feel better by next week, please call back.  Please call sooner if you get worse.  Please stop taking the magnesium pill for a few days, as stopping this can help the diarrhea.   drink plenty of fluids.

## 2015-05-07 ENCOUNTER — Telehealth: Payer: Self-pay | Admitting: Endocrinology

## 2015-05-07 LAB — URINALYSIS, ROUTINE W REFLEX MICROSCOPIC
Bilirubin Urine: NEGATIVE
Ketones, ur: NEGATIVE
Nitrite: NEGATIVE
SPECIFIC GRAVITY, URINE: 1.02 (ref 1.000–1.030)
Total Protein, Urine: NEGATIVE
UROBILINOGEN UA: 0.2 (ref 0.0–1.0)
Urine Glucose: NEGATIVE
pH: 6 (ref 5.0–8.0)

## 2015-05-07 LAB — BASIC METABOLIC PANEL
BUN: 14 mg/dL (ref 6–23)
CHLORIDE: 102 meq/L (ref 96–112)
CO2: 26 meq/L (ref 19–32)
CREATININE: 1.02 mg/dL (ref 0.40–1.20)
Calcium: 9.2 mg/dL (ref 8.4–10.5)
GFR: 68.69 mL/min (ref >60.00)
Glucose, Bld: 86 mg/dL (ref 70–99)
Potassium: 4.5 mEq/L (ref 3.5–5.1)
Sodium: 136 mEq/L (ref 135–145)

## 2015-05-07 LAB — CBC WITH DIFFERENTIAL/PLATELET
BASOS PCT: 0.3 % (ref 0.0–3.0)
Basophils Absolute: 0 10*3/uL (ref 0.0–0.1)
EOS ABS: 0.1 10*3/uL (ref 0.0–0.7)
Eosinophils Relative: 1.4 % (ref 0.0–5.0)
HEMATOCRIT: 33.1 % — AB (ref 36.0–46.0)
Hemoglobin: 10.7 g/dL — ABNORMAL LOW (ref 12.0–15.0)
LYMPHS PCT: 23.2 % (ref 12.0–46.0)
Lymphs Abs: 1 10*3/uL (ref 0.7–4.0)
MCHC: 32.3 g/dL (ref 30.0–36.0)
MCV: 94.7 fl (ref 78.0–100.0)
Monocytes Absolute: 0.3 10*3/uL (ref 0.1–1.0)
Monocytes Relative: 5.9 % (ref 3.0–12.0)
NEUTROS ABS: 3.1 10*3/uL (ref 1.4–7.7)
Neutrophils Relative %: 69.2 % (ref 43.0–77.0)
PLATELETS: 265 10*3/uL (ref 150.0–400.0)
RBC: 3.49 Mil/uL — ABNORMAL LOW (ref 3.87–5.11)
RDW: 14.2 % (ref 11.5–15.5)
WBC: 4.5 10*3/uL (ref 4.0–10.5)

## 2015-05-07 LAB — MAGNESIUM: MAGNESIUM: 1.4 mg/dL — AB (ref 1.5–2.5)

## 2015-05-07 LAB — VITAMIN D 25 HYDROXY (VIT D DEFICIENCY, FRACTURES): VITD: 27.68 ng/mL — AB (ref 30.00–100.00)

## 2015-05-07 MED ORDER — CEFUROXIME AXETIL 500 MG PO TABS: 500.0000 mg | ORAL_TABLET | Freq: Two times a day (BID) | ORAL | Status: DC

## 2015-05-07 NOTE — Telephone Encounter (Signed)
I contacted the pt and advised her results have not been released and reviewed yet. Pt will be contacted once the result is completed.

## 2015-05-07 NOTE — Telephone Encounter (Signed)
Patient is calling for her results. 

## 2015-05-08 ENCOUNTER — Telehealth: Payer: Self-pay | Admitting: Endocrinology

## 2015-05-08 NOTE — Telephone Encounter (Signed)
please call patient: Please take the antibiotic, then come back for ov next week, as you need this for clearance for surgery

## 2015-05-08 NOTE — Telephone Encounter (Signed)
I contacted the pt and left a voicemail advising of not below. Requested a call back if the pt would like to discuss.

## 2015-05-09 ENCOUNTER — Encounter: Payer: Self-pay | Admitting: Physician Assistant

## 2015-05-09 ENCOUNTER — Other Ambulatory Visit: Payer: Medicare Other

## 2015-05-09 ENCOUNTER — Ambulatory Visit (INDEPENDENT_AMBULATORY_CARE_PROVIDER_SITE_OTHER): Payer: Medicare Other | Admitting: Physician Assistant

## 2015-05-09 VITALS — BP 110/68 | HR 68 | Ht 65.0 in | Wt 211.1 lb

## 2015-05-09 DIAGNOSIS — R1084 Generalized abdominal pain: Secondary | ICD-10-CM | POA: Diagnosis not present

## 2015-05-09 DIAGNOSIS — R109 Unspecified abdominal pain: Secondary | ICD-10-CM | POA: Diagnosis not present

## 2015-05-09 DIAGNOSIS — R197 Diarrhea, unspecified: Secondary | ICD-10-CM | POA: Diagnosis not present

## 2015-05-09 MED ORDER — SACCHAROMYCES BOULARDII 250 MG PO CAPS: 250.0000 mg | ORAL_CAPSULE | Freq: Two times a day (BID) | ORAL | Status: DC

## 2015-05-09 NOTE — Patient Instructions (Signed)
We have given you information on low residue, low fat, and dairy free diet.  We have sent the following medications to your pharmacy for you to pick up at your convenience: [name] has requested that you go to the basement for the following lab work before leaving today: C.Diff  We will cal you with the results of the C.Diff

## 2015-05-10 ENCOUNTER — Telehealth: Payer: Self-pay | Admitting: Physician Assistant

## 2015-05-10 ENCOUNTER — Other Ambulatory Visit: Payer: Medicare Other

## 2015-05-10 DIAGNOSIS — R1084 Generalized abdominal pain: Secondary | ICD-10-CM | POA: Diagnosis not present

## 2015-05-10 DIAGNOSIS — R197 Diarrhea, unspecified: Secondary | ICD-10-CM | POA: Diagnosis not present

## 2015-05-10 DIAGNOSIS — R109 Unspecified abdominal pain: Secondary | ICD-10-CM

## 2015-05-10 NOTE — Telephone Encounter (Signed)
Pt contacted, she was confused about what her Florastor was. Pt was told its NOT a antibiotic but a probiotic. Pt was advised to start the Florastor as direct and that this is an over the counter supplement. Pt now states understanding.

## 2015-05-11 ENCOUNTER — Encounter: Payer: Self-pay | Admitting: Physician Assistant

## 2015-05-11 LAB — CLOSTRIDIUM DIFFICILE BY PCR: CDIFFPCR: NEGATIVE

## 2015-05-11 NOTE — Progress Notes (Signed)
Patient ID: [name], female   DOB: [birth year], 69 y.o.   MRN: [id number]     History of Present Illness: [name] is pleasant 69 year old female who is currently followed by [name] and was previously followed by Dr. Sharlett Iles. She has a history of GERD/peptic strictures, C. difficile, chronic abdominal pain, diverticular disease, and chronic diarrhea. She has had multiple abdominal surgeries including hysterectomy, bilateral salpingo-oophorectomy and cholecystectomy. She has had multiple small bowel obstructions and lysis of adhesions.  She is recently seen in the emergency room on November 20 and diagnosed with a UTI. She was treated with anti-biotic started with diarrhea on Thanksgiving. Since then she is having 10-15 bowel movements daily that are watery. Since 3 days ago and was again diagnosed with a UTI and placed on Ceftin. She has intermittent crampy right lower quadrant abdominal pain. She is nauseous and is afraid to eat because every time she eats she has diarrhea. She has had no fever, chills, or night sweats.   Past Medical History  Diagnosis Date  . Obesity   . PUD (peptic ulcer disease)   . Leukopenia   . Short bowel syndrome   . Internal hemorrhoids without mention of complication   . Stricture and stenosis of esophagus   . Osteoarthrosis, unspecified whether generalized or localized, unspecified site   . Thyrotoxicosis without mention of goiter or other cause, without mention of thyrotoxic crisis or storm   . Other and unspecified hyperlipidemia   . Gout, unspecified   . Diverticulosis of colon (without mention of hemorrhage)   . C. difficile colitis   . VITAMIN B12 DEFICIENCY 08/30/2009  . GOITER, MULTINODULAR 04/02/2009  . HYPOTHYROIDISM, POST-RADIATION 08/13/2009  . ASYMPTOMATIC POSTMENOPAUSAL STATUS 10/11/2008  . Esophageal reflux 06/12/2008  . PONV (postoperative nausea and vomiting)   . Varicose veins   . Blood transfusion   . UTI (urinary  tract infection)   . Kidney stones     "several"  . Unspecified essential hypertension   . Angina   . Shortness of breath on exertion     "sometimes"  . ANEMIA, IRON DEFICIENCY 05/08/2009  . History of lower GI bleeding   . H/O hiatal hernia   . Migraines   . Breast cancer ([place]) 09/29/11    invasive grade III ductal ca,assoc high grade dcis,ER/PR=neg  . History of radiation therapy 02/08/12-03/25/12    left breast,total 61gy  . Blood transfusion without reported diagnosis   . Hypomagnesemia   . Hypokalemia 05/11/2013  . Gastroparesis   . Type II or unspecified type diabetes mellitus without mention of complication, not stated as uncontrolled     no med in years diet controled    Past Surgical History  Procedure Laterality Date  . Vein ligation and stripping  1980's    Right leg  . Lithotripsy      "4 or 5 times"  . Bunionectomy  1970's    bilateral  . Colonoscopy  2012    multiple   . Esophagogastroduodenoscopy  2011    multiple   . Thyroid ultrasound  12/1994 and 12/1995  . Mastectomy w/ nodes partial  09/29/11    left  . Cholecystectomy  1990's  . Abdominal hysterectomy  1970's    with BSO  . Dilation and curettage of uterus    . Colon surgery      "several surgeries for short bowel syndrome"  . Abdominal adhesion surgery  1980's thru 1990's    "several"  .  Kidney stone surgery  1990's    "tried to go up & get it but pushed it further up"  . Portacath placement  09/29/2011    Procedure: INSERTION PORT-A-CATH;  Surgeon: Adin Hector, MD;  Location: [facility];  Service: General;  Laterality: N/A;  . Breast biopsy  08/13/11    left breast lower inner quadrant  . Breast lumpectomy w/ needle localization  09/29/11    left  breast=lymph node,excision benign/ ER/PR=neg, her 2 Positive  . Flexible sigmoidoscopy  2011    multiple   . Port-a-cath removal Right 12/19/2013    Procedure: MINOR REMOVAL PORT-A-CATH;  Surgeon: Adin Hector, MD;  Location: [facility];  Service: General;  Laterality: Right;   Family History  Problem Relation Age of Onset  . Esophageal cancer Son     deceased  . Diabetes Mother   . Heart disease Mother   . Colon cancer Paternal Uncle   . Kidney disease Sister   . Diabetes Father   . Hypertension Father   . Kidney disease Brother     x 3   Social History  Substance Use Topics  . Smoking status: Former Smoker -- 1.00 packs/day for 10 years    Types: Cigarettes    Quit date: 09/22/1985  . Smokeless tobacco: Never Used  . Alcohol Use: No     Comment: 09/29/11 "used to drink socially years ago"   Current Outpatient Prescriptions  Medication Sig Dispense Refill  . aMILoride (MIDAMOR) 5 MG tablet Take 5 mg by mouth 2 (two) times daily.   11  . calcium carbonate (TUMS - DOSED IN MG ELEMENTAL CALCIUM) 500 MG chewable tablet Chew 1 tablet by mouth as needed for indigestion or heartburn.    . Calcium Carbonate-Vitamin D (CALCIUM-VITAMIN D) 600-200 MG-UNIT CAPS Take 1 capsule by mouth daily.      . cefUROXime (CEFTIN) 500 MG tablet Take 1 tablet (500 mg total) by mouth 2 (two) times daily with a meal. 14 tablet 0  . cyanocobalamin (,VITAMIN B-12,) 1000 MCG/ML injection Inject 1,000 mcg into the muscle every 30 (thirty) days.     Marland Kitchen diltiazem 2 % GEL Apply 1 application topically 2 (two) times daily. 30 g 0  . ferrous sulfate 325 (65 FE) MG tablet Take 325 mg by mouth daily with breakfast.    . fluorometholone (FML) 0.1 % ophthalmic suspension Place 1 drop into both eyes 2 (two) times daily.    Marland Kitchen gabapentin (NEURONTIN) 300 MG capsule Take 1 capsule (300 mg total) by mouth 3 (three) times daily. 90 capsule 3  . levothyroxine (SYNTHROID, LEVOTHROID) 150 MCG tablet TAKE 1 TABLET BY MOUTH DAILY BEFORE BREAKFAST. 90 tablet 1  . loperamide (IMODIUM) 2 MG capsule Take 2 capsules (4 mg total) by mouth every 4 (four) hours as needed for diarrhea or loose stools. 30 capsule 0  . lovastatin (MEVACOR) 20 MG tablet TAKE 1 TABLET  (20 MG TOTAL) BY MOUTH DAILY AT 6 PM. 90 tablet 1  . magnesium chloride (SLOW-MAG) 64 MG TBEC SR tablet Take 1 tablet by mouth 3 (three) times daily.    . magnesium oxide (MAG-OX) 400 (241.3 MG) MG tablet Take 400 mg by mouth 3 (three) times daily.   11  . ondansetron (ZOFRAN ODT) 4 MG disintegrating tablet Take 1 tablet (4 mg total) by mouth every 8 (eight) hours as needed for nausea. 30 tablet 2  . oxyCODONE-acetaminophen (PERCOCET) 7.5-325 MG tablet Take 1 tab every 4-6  hours as needed for pain. 100 tablet 0  . pantoprazole (PROTONIX) 40 MG tablet TAKE 1 TABLET 30 MINUTES BEFORE BREAKFAST AND SUPPER 180 tablet 1  . pramoxine-hydrocortisone (ANALPRAM HC) cream Apply topically 2 (two) times daily. 30 g 0  . thiamine 100 MG tablet Take 100 mg by mouth daily.      . Vitamin D, Ergocalciferol, (DRISDOL) 50000 UNITS CAPS capsule TAKE 1 TABLET TWICE A WEEK 10 capsule 5  . calcium gluconate 650 MG tablet Take 1 tablet (650 mg total) by mouth daily. 30 tablet 2  . saccharomyces boulardii (FLORASTOR) 250 MG capsule Take 1 capsule (250 mg total) by mouth 2 (two) times daily. 60 capsule 1   No current facility-administered medications for this visit.   Facility-Administered Medications Ordered in Other Visits  Medication Dose Route Frequency Provider Last Rate Last Dose  . acetaminophen (TYLENOL) tablet 1,000 mg  1,000 mg Oral Once Amada Kingfisher, MD   1,000 mg at 08/09/13 1658   Allergies  Allergen Reactions  . Aspirin Other (See Comments)    REACTION: Gi Intolerance/ Burning in stomach  . Codeine Itching  . Trazodone And Nefazodone     "sick"  . Flagyl [Metronidazole] Rash  . Iodine Itching    Allergic to IVP dye  . Morphine And Related Itching     Review of Systems: Gen: Denies any fever, chills, sweats, anorexia, fatigue, weakness, malaise, weight loss, and sleep disorder CV: Denies chest pain, angina, palpitations, syncope, orthopnea, PND, peripheral edema, and claudication. Resp: Denies  dyspnea at rest, dyspnea with exercise, cough, sputum, wheezing, coughing up blood, and pleurisy. GI: Denies vomiting blood, jaundice, and fecal incontinence.   Denies dysphagia or odynophagia. GU : Denies urinary burning, blood in urine, urinary frequency, urinary hesitancy, nocturnal urination, and urinary incontinence. MS: Denies joint pain, limitation of movement, and swelling, stiffness, low back pain, extremity pain. Denies muscle weakness, cramps, atrophy.  Derm: Denies rash, itching, dry skin, hives, moles, warts, or unhealing ulcers.  Psych: Denies depression, anxiety, memory loss, suicidal ideation, hallucinations, paranoia, and confusion. Heme: Denies bruising, bleeding, and enlarged lymph nodes. Neuro:  Denies any headaches, dizziness, paresthesia Endo:  Denies any problems with DM, thyroid, adrenal     Studies:   Ct Abdomen Pelvis Wo Contrast  04/21/2015  CLINICAL DATA:  Left-sided abdominal pain for 2 days. Nausea. Past medical history of bowel obstruction. EXAM: CT ABDOMEN AND PELVIS WITHOUT CONTRAST TECHNIQUE: Multidetector CT imaging of the abdomen and pelvis was performed following the standard protocol without IV contrast.Oral contrast was given. Intravenous contrast was not administered due to iodine allergy. COMPARISON:  11/22/2014 FINDINGS: Lower chest: The lung bases are clear. There is a small hiatal hernia. Hepatobiliary: No mass visualized on this un-enhanced exam. There has been a prior cholecystectomy. Pancreas: No mass or inflammatory process identified on this un-enhanced exam. Spleen: Within normal limits in size. Adrenals/Urinary Tract: No evidence of urolithiasis or hydronephrosis. No definite mass visualized on this un-enhanced exam. Stomach/Bowel: No evidence of bowel obstruction. Postsurgical changes from prior abdominal surgery are seen with thickening of the anterior peritoneum and adhesion of several small and large bowel loops to the anterior abdominal wall in  the infraumbilical abdomen. Multiple postsurgical clips are seen in the pelvis. There is a 4.3 by 1.6 cm mesenteric soft tissue thickening adjacent to the areas of scarring, at the site of previous focal fluid collection. Vascular/Lymphatic: No pathologically enlarged lymph nodes. No evidence of abdominal aortic aneurysm. Reproductive: Status post hysterectomy. Other: None.  Musculoskeletal:  No suspicious bone lesions identified. IMPRESSION: Largely stable postsurgical findings in the lower abdomen/ pelvis with thickening of the anterior peritoneum and adhesion of small and large bowel loops, without evidence of obstruction. Residual soft tissue thickening in the right lower abdomen, at the site of previously demonstrated fluid collection. No evidence of abnormalities within the solid organs of the abdomen, accounting for lack of IV contrast. Small hiatal hernia. Electronically Signed   By: Fidela Salisbury M.D.   On: 04/21/2015 15:41     Physical Exam: BP 110/68 mmHg  Pulse 68  Ht 5\' 5"  (1.651 m)  Wt 211 lb 0.8 oz (95.732 kg)  BMI 35.12 kg/m2 General: Pleasant, well developed , female in no acute distress Head: Normocephalic and atraumatic Eyes:  sclerae anicteric, conjunctiva pink  Ears: Normal auditory acuity Lungs: Clear throughout to auscultation Heart: Regular rate and rhythm Abdomen: Soft, non distended, non-tender. No masses, no hepatomegaly. Normal bowel sounds Musculoskeletal: Symmetrical with no gross deformities  Extremities: No edema  Neurological: Alert oriented x 4, grossly nonfocal Psychological:  Alert and cooperative. Normal mood and affect  Assessment and Recommendations: 69 year old female with history of diverticular disease and C. difficile, status post and [place] 2 for UTI, now with diarrhea. 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 Patient will be stent for re-C. difficile PCR. She's been instructed to adhere to a low residue, low-fat, dairy free diet. She  will start [place] [employer] 250 mg 1 by mouth twice daily for 4 weeks. If she is found to be C. difficile positive, she will be treated with a course of vancomycin. If she is C. difficile negative, her diarrhea may possibly be due to an ant biotic associated diarrhea with alteration of the gut flora, and she will continue a probiotic and we will try antidiarrheal medications.        Akhilesh Sassone, Vita Barley PA-C 05/11/2015,

## 2015-05-12 NOTE — Progress Notes (Signed)
Reviewed and agree with management plan.  Amaar Oshita T. Aryav Wimberly, MD FACG 

## 2015-05-14 ENCOUNTER — Encounter: Payer: Self-pay | Admitting: Endocrinology

## 2015-05-14 ENCOUNTER — Ambulatory Visit (INDEPENDENT_AMBULATORY_CARE_PROVIDER_SITE_OTHER): Payer: Medicare Other | Admitting: Endocrinology

## 2015-05-14 ENCOUNTER — Ambulatory Visit: Payer: Medicare Other

## 2015-05-14 VITALS — BP 126/87 | HR 83 | Temp 97.8°F | Ht 65.0 in | Wt 215.0 lb

## 2015-05-14 DIAGNOSIS — N390 Urinary tract infection, site not specified: Secondary | ICD-10-CM

## 2015-05-14 DIAGNOSIS — E538 Deficiency of other specified B group vitamins: Secondary | ICD-10-CM | POA: Diagnosis not present

## 2015-05-14 DIAGNOSIS — N3001 Acute cystitis with hematuria: Secondary | ICD-10-CM | POA: Diagnosis not present

## 2015-05-14 LAB — URINALYSIS, ROUTINE W REFLEX MICROSCOPIC
BILIRUBIN URINE: NEGATIVE
HGB URINE DIPSTICK: NEGATIVE
KETONES UR: NEGATIVE
LEUKOCYTES UA: NEGATIVE
NITRITE: NEGATIVE
Specific Gravity, Urine: 1.015 (ref 1.000–1.030)
TOTAL PROTEIN, URINE-UPE24: NEGATIVE
URINE GLUCOSE: NEGATIVE
Urobilinogen, UA: 0.2 (ref 0.0–1.0)
pH: 7 (ref 5.0–8.0)

## 2015-05-14 MED ORDER — OXYCODONE-ACETAMINOPHEN 7.5-325 MG PO TABS: ORAL_TABLET | ORAL | Status: DC

## 2015-05-14 MED ORDER — CYANOCOBALAMIN 1000 MCG/ML IJ SOLN
1000.0000 ug | Freq: Once | INTRAMUSCULAR | Status: AC
  Administered 2015-05-14: 1000 ug via INTRAMUSCULAR

## 2015-05-14 NOTE — Patient Instructions (Signed)
blood tests are requested for you today.  We'll let you know about the results.   If the result is ok, you are good to go for your surgery.

## 2015-05-14 NOTE — Progress Notes (Signed)
Subjective:    Patient ID: [name], female    DOB: 11-29-1945, 69 y.o.   MRN: [id number]  HPI  The state of at least three ongoing medical problems is addressed today, with interval history of each noted here:  Pt returns for f/u of UTI.  She is almost finished with her abx.  She denies dysuria No change in chronic intermittent diarrhea (saw GI last week) HTN: she denies cp/sob Past Medical History  Diagnosis Date  . Obesity   . PUD (peptic ulcer disease)   . Leukopenia   . Short bowel syndrome   . Internal hemorrhoids without mention of complication   . Stricture and stenosis of esophagus   . Osteoarthrosis, unspecified whether generalized or localized, unspecified site   . Thyrotoxicosis without mention of goiter or other cause, without mention of thyrotoxic crisis or storm   . Other and unspecified hyperlipidemia   . Gout, unspecified   . Diverticulosis of colon (without mention of hemorrhage)   . C. difficile colitis   . VITAMIN B12 DEFICIENCY 08/30/2009  . GOITER, MULTINODULAR 04/02/2009  . HYPOTHYROIDISM, POST-RADIATION 08/13/2009  . ASYMPTOMATIC POSTMENOPAUSAL STATUS 10/11/2008  . Esophageal reflux 06/12/2008  . PONV (postoperative nausea and vomiting)   . Varicose veins   . Blood transfusion   . UTI (urinary tract infection)   . Kidney stones     "several"  . Unspecified essential hypertension   . Angina   . Shortness of breath on exertion     "sometimes"  . ANEMIA, IRON DEFICIENCY 05/08/2009  . History of lower GI bleeding   . H/O hiatal hernia   . Migraines   . Breast cancer ([place]) 09/29/11    invasive grade III ductal ca,assoc high grade dcis,ER/PR=neg  . History of radiation therapy 02/08/12-03/25/12    left breast,total 61gy  . Blood transfusion without reported diagnosis   . Hypomagnesemia   . Hypokalemia 05/11/2013  . Gastroparesis   . Type II or unspecified type diabetes mellitus without mention of complication, not stated as uncontrolled     no  med in years diet controled    Past Surgical History  Procedure Laterality Date  . Vein ligation and stripping  1980's    Right leg  . Lithotripsy      "4 or 5 times"  . Bunionectomy  1970's    bilateral  . Colonoscopy  2012    multiple   . Esophagogastroduodenoscopy  2011    multiple   . Thyroid ultrasound  12/1994 and 12/1995  . Mastectomy w/ nodes partial  09/29/11    left  . Cholecystectomy  1990's  . Abdominal hysterectomy  1970's    with BSO  . Dilation and curettage of uterus    . Colon surgery      "several surgeries for short bowel syndrome"  . Abdominal adhesion surgery  1980's thru 1990's    "several"  . Kidney stone surgery  1990's    "tried to go up & get it but pushed it further up"  . Portacath placement  09/29/2011    Procedure: INSERTION PORT-A-CATH;  Surgeon: Adin Hector, MD;  Location: [facility];  Service: General;  Laterality: N/A;  . Breast biopsy  08/13/11    left breast lower inner quadrant  . Breast lumpectomy w/ needle localization  09/29/11    left  breast=lymph node,excision benign/ ER/PR=neg, her 2 Positive  . Flexible sigmoidoscopy  2011    multiple   . Port-a-cath removal  Right 12/19/2013    Procedure: MINOR REMOVAL PORT-A-CATH;  Surgeon: Adin Hector, MD;  Location: [facility];  Service: General;  Laterality: Right;    Social History   Social History  . Marital Status: Married    Spouse Name: N/A  . Number of Children: 2  . Years of Education: N/A   Occupational History  . RETIRED    Social History Main Topics  . Smoking status: Former Smoker -- 1.00 packs/day for 10 years    Types: Cigarettes    Quit date: 09/22/1985  . Smokeless tobacco: Never Used  . Alcohol Use: No     Comment: 09/29/11 "used to drink socially years ago"  . Drug Use: No  . Sexual Activity: No     Comment: HRT x many yrs   Other Topics Concern  . Not on file   Social History Narrative   Pt gets regular exercise    Current Outpatient  Prescriptions on File Prior to Visit  Medication Sig Dispense Refill  . aMILoride (MIDAMOR) 5 MG tablet Take 5 mg by mouth 2 (two) times daily.   11  . calcium carbonate (TUMS - DOSED IN MG ELEMENTAL CALCIUM) 500 MG chewable tablet Chew 1 tablet by mouth as needed for indigestion or heartburn.    . Calcium Carbonate-Vitamin D (CALCIUM-VITAMIN D) 600-200 MG-UNIT CAPS Take 1 capsule by mouth daily.      . cefUROXime (CEFTIN) 500 MG tablet Take 1 tablet (500 mg total) by mouth 2 (two) times daily with a meal. 14 tablet 0  . cyanocobalamin (,VITAMIN B-12,) 1000 MCG/ML injection Inject 1,000 mcg into the muscle every 30 (thirty) days.     Marland Kitchen diltiazem 2 % GEL Apply 1 application topically 2 (two) times daily. 30 g 0  . ferrous sulfate 325 (65 FE) MG tablet Take 325 mg by mouth daily with breakfast.    . fluorometholone (FML) 0.1 % ophthalmic suspension Place 1 drop into both eyes 2 (two) times daily.    Marland Kitchen gabapentin (NEURONTIN) 300 MG capsule Take 1 capsule (300 mg total) by mouth 3 (three) times daily. 90 capsule 3  . levothyroxine (SYNTHROID, LEVOTHROID) 150 MCG tablet TAKE 1 TABLET BY MOUTH DAILY BEFORE BREAKFAST. 90 tablet 1  . loperamide (IMODIUM) 2 MG capsule Take 2 capsules (4 mg total) by mouth every 4 (four) hours as needed for diarrhea or loose stools. 30 capsule 0  . lovastatin (MEVACOR) 20 MG tablet TAKE 1 TABLET (20 MG TOTAL) BY MOUTH DAILY AT 6 PM. 90 tablet 1  . magnesium chloride (SLOW-MAG) 64 MG TBEC SR tablet Take 1 tablet by mouth 3 (three) times daily.    . magnesium oxide (MAG-OX) 400 (241.3 MG) MG tablet Take 400 mg by mouth 3 (three) times daily.   11  . ondansetron (ZOFRAN ODT) 4 MG disintegrating tablet Take 1 tablet (4 mg total) by mouth every 8 (eight) hours as needed for nausea. 30 tablet 2  . pantoprazole (PROTONIX) 40 MG tablet TAKE 1 TABLET 30 MINUTES BEFORE BREAKFAST AND SUPPER 180 tablet 1  . pramoxine-hydrocortisone (ANALPRAM HC) cream Apply topically 2 (two) times  daily. 30 g 0  . saccharomyces boulardii (FLORASTOR) 250 MG capsule Take 1 capsule (250 mg total) by mouth 2 (two) times daily. 60 capsule 1  . thiamine 100 MG tablet Take 100 mg by mouth daily.      . Vitamin D, Ergocalciferol, (DRISDOL) 50000 UNITS CAPS capsule TAKE 1 TABLET TWICE A WEEK 10 capsule  5  . calcium gluconate 650 MG tablet Take 1 tablet (650 mg total) by mouth daily. 30 tablet 2   Current Facility-Administered Medications on File Prior to Visit  Medication Dose Route Frequency Provider Last Rate Last Dose  . acetaminophen (TYLENOL) tablet 1,000 mg  1,000 mg Oral Once Amada Kingfisher, MD   1,000 mg at 08/09/13 1658    Allergies  Allergen Reactions  . Aspirin Other (See Comments)    REACTION: Gi Intolerance/ Burning in stomach  . Codeine Itching  . Trazodone And Nefazodone     "sick"  . Flagyl [Metronidazole] Rash  . Iodine Itching    Allergic to IVP dye  . Morphine And Related Itching    Family History  Problem Relation Age of Onset  . Esophageal cancer Son     deceased  . Diabetes Mother   . Heart disease Mother   . Colon cancer Paternal Uncle   . Kidney disease Sister   . Diabetes Father   . Hypertension Father   . Kidney disease Brother     x 3    BP 126/87 mmHg  Pulse 83  Temp(Src) 97.8 F (36.6 C) (Oral)  Ht 5\' 5"  (1.651 m)  Wt 215 lb (97.523 kg)  BMI 35.78 kg/m2  SpO2 97%    Review of Systems Denies fever/n/v//dizziness/LOC    Objective:   Physical Exam VITAL SIGNS:  See vs page GENERAL: no distress LUNGS:  Clear to auscultation HEART:  Regular rate and rhythm without murmurs noted. Normal S1,S2.   Ext: trace bilat leg edema   UA: neg    Assessment & Plan:  UTI: resolved Diarrhea, chronic intermittent, noninfectious. HTN: well-controlled   Patient is advised the following: Patient Instructions  blood tests are requested for you today.  We'll let you know about the results.   If the result is ok, you are good to go for your  surgery.    addendum: her surgical risk is low and outweighed by the potential benefit of the surgery.  she is therefore medically cleared

## 2015-05-21 ENCOUNTER — Other Ambulatory Visit: Payer: Self-pay | Admitting: Physician Assistant

## 2015-05-21 DIAGNOSIS — M170 Bilateral primary osteoarthritis of knee: Secondary | ICD-10-CM | POA: Diagnosis not present

## 2015-05-21 NOTE — H&P (Signed)
TOTAL KNEE ADMISSION H&P  Patient is being admitted for right total knee arthroplasty.  Subjective:  Chief Complaint:right knee pain.  HPI: [name], 69 y.o. female, has a history of pain and functional disability in the right knee due to arthritis and has failed non-surgical conservative treatments for greater than 12 weeks to includeNSAID's and/or analgesics and corticosteriod injections.  Onset of symptoms was gradual, starting 5 years ago with gradually worsening course since that time. The patient noted no past surgery on the right knee(s).  Patient currently rates pain in the right knee(s) at 10 out of 10 with activity. Patient has night pain, worsening of pain with activity and weight bearing, pain that interferes with activities of daily living, pain with passive range of motion, crepitus and joint swelling.  Patient has evidence of subchondral sclerosis and joint space narrowing by imaging studies. There is no active infection.  Patient Active Problem List   Diagnosis Date Noted  . UTI (urinary tract infection) 11/27/2014  . UTI (lower urinary tract infection) 11/27/2014  . Acute cystitis with hematuria 11/13/2014  . Hemorrhoids, external 11/04/2014  . Wellness examination 10/03/2014  . Pain in joint, shoulder region 09/24/2014  . Cramp in limb 06/26/2014  . Rectal bleeding 05/02/2014  . External hemorrhoids 05/02/2014  . Hemorrhoids without complication [id number]  . Pain in joint, lower leg 02/01/2014  . Vitamin D deficiency 01/16/2014  . Gastroparesis 09/14/2013  . Other dysphagia 09/04/2013  . Nausea alone 09/04/2013  . Iron deficiency anemia, unspecified 08/28/2013  . Hypomagnesemia 05/11/2013  . Hypokalemia 05/11/2013  . Generalized weakness 05/11/2013  . Dehydration 11/25/2011  . Fatigue 11/16/2011  . Breast cancer ([place]) 09/29/2011  . Family history of breast cancer 08/19/2011  . Primary cancer of lower-inner quadrant of left female breast ([place])  08/17/2011  . Neck pain on left side 07/29/2011  . Routine general medical examination at a health care facility 06/28/2011  . Edema 11/26/2010  . CLOSTRIDIUM DIFFICILE COLITIS 02/07/2010  . Blind loop syndrome 10/01/2009  . Abdominal pain 10/01/2009  . Vitamin B 12 deficiency 08/30/2009  . 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 GOITER, MULTINODULAR 04/02/2009  . CONTACT DERMATITIS&OTHER ECZEMA DUE UNSPEC CAUSE 02/20/2009  . URINARY CALCULUS 10/11/2008  . ASYMPTOMATIC POSTMENOPAUSAL STATUS 10/11/2008  . BACK PAIN, CHRONIC 07/03/2008  . Esophageal reflux 06/12/2008  . Diarrhea 06/12/2008  . ABDOMINAL PAIN-RUQ 05/17/2008  . Diabetes ([place]) 12/10/2006  . HYPERLIPIDEMIA 12/10/2006  . Gout 12/10/2006  . HYPERTENSION 12/10/2006  . DIVERTICULOSIS, COLON 12/10/2006  . OSTEOARTHRITIS 12/10/2006  . ESOPHAGEAL STRICTURE 08/23/2002  . HIATAL HERNIA 08/23/2002  . INTERNAL HEMORRHOIDS 02/02/2001   Past Medical History  Diagnosis Date  . Obesity   . PUD (peptic ulcer disease)   . Leukopenia   . Short bowel syndrome   . Internal hemorrhoids without mention of complication   . Stricture and stenosis of esophagus   . Osteoarthrosis, unspecified whether generalized or localized, unspecified site   . Thyrotoxicosis without mention of goiter or other cause, without mention of thyrotoxic crisis or storm   . Other and unspecified hyperlipidemia   . Gout, unspecified   . Diverticulosis of colon (without mention of hemorrhage)   . C. difficile colitis   . VITAMIN B12 DEFICIENCY 08/30/2009  . GOITER, MULTINODULAR 04/02/2009  . HYPOTHYROIDISM, POST-RADIATION 08/13/2009  . ASYMPTOMATIC POSTMENOPAUSAL STATUS 10/11/2008  . Esophageal reflux 06/12/2008  . PONV (postoperative nausea and vomiting)   . Varicose veins   . Blood transfusion   .  UTI (urinary tract infection)   . Kidney stones     "several"  . Unspecified essential hypertension   . Angina   . Shortness of breath on  exertion     "sometimes"  . ANEMIA, IRON DEFICIENCY 05/08/2009  . History of lower GI bleeding   . H/O hiatal hernia   . Migraines   . Breast cancer ([place]) 09/29/11    invasive grade III ductal ca,assoc high grade dcis,ER/PR=neg  . History of radiation therapy 02/08/12-03/25/12    left breast,total 61gy  . Blood transfusion without reported diagnosis   . Hypomagnesemia   . Hypokalemia 05/11/2013  . Gastroparesis   . Type II or unspecified type diabetes mellitus without mention of complication, not stated as uncontrolled     no med in years diet controled    Past Surgical History  Procedure Laterality Date  . Vein ligation and stripping  1980's    Right leg  . Lithotripsy      "4 or 5 times"  . Bunionectomy  1970's    bilateral  . Colonoscopy  2012    multiple   . Esophagogastroduodenoscopy  2011    multiple   . Thyroid ultrasound  12/1994 and 12/1995  . Mastectomy w/ nodes partial  09/29/11    left  . Cholecystectomy  1990's  . Abdominal hysterectomy  1970's    with BSO  . Dilation and curettage of uterus    . Colon surgery      "several surgeries for short bowel syndrome"  . Abdominal adhesion surgery  1980's thru 1990's    "several"  . Kidney stone surgery  1990's    "tried to go up & get it but pushed it further up"  . Portacath placement  09/29/2011    Procedure: INSERTION PORT-A-CATH;  Surgeon: Adin Hector, MD;  Location: [facility];  Service: General;  Laterality: N/A;  . Breast biopsy  08/13/11    left breast lower inner quadrant  . Breast lumpectomy w/ needle localization  09/29/11    left  breast=lymph node,excision benign/ ER/PR=neg, her 2 Positive  . Flexible sigmoidoscopy  2011    multiple   . Port-a-cath removal Right 12/19/2013    Procedure: MINOR REMOVAL PORT-A-CATH;  Surgeon: Adin Hector, MD;  Location: [facility];  Service: General;  Laterality: Right;     (Not in a hospital admission) Allergies  Allergen Reactions  . Aspirin Other  (See Comments)    REACTION: Gi Intolerance/ Burning in stomach  . Codeine Itching  . Trazodone And Nefazodone     "sick"  . Flagyl [Metronidazole] Rash  . Iodine Itching    Allergic to IVP dye  . Morphine And Related Itching    Social History  Substance Use Topics  . Smoking status: Former Smoker -- 1.00 packs/day for 10 years    Types: Cigarettes    Quit date: 09/22/1985  . Smokeless tobacco: Never Used  . Alcohol Use: No     Comment: 09/29/11 "used to drink socially years ago"    Family History  Problem Relation Age of Onset  . Esophageal cancer Son     deceased  . Diabetes Mother   . Heart disease Mother   . Colon cancer Paternal Uncle   . Kidney disease Sister   . Diabetes Father   . Hypertension Father   . Kidney disease Brother     x 3     Review of Systems  Constitutional: Negative.  HENT: Negative.   Eyes: Negative.   Respiratory: Negative.   Cardiovascular: Negative.   Gastrointestinal: Negative.   Genitourinary: Negative.   Musculoskeletal: Positive for joint pain.  Skin: Negative.   Neurological: Negative.   Endo/Heme/Allergies: Negative.   Psychiatric/Behavioral: Negative.     Objective:  Physical Exam  Constitutional: She is oriented to person, place, and time. She appears well-developed and well-nourished.  HENT:  Head: Normocephalic and atraumatic.  Eyes: EOM are normal. Pupils are equal, round, and reactive to light.  Neck: Normal range of motion. Neck supple.  Cardiovascular: Normal rate and regular rhythm.   Respiratory: Effort normal and breath sounds normal.  GI: Soft. Bowel sounds are normal.  Musculoskeletal:  Range of motion 0-125 degrees.  Valgus thrust, both sides.  Positive straight leg raise.  Negative log roll.  Medial greater than lateral joint line tenderness.    Neurological: She is alert and oriented to person, place, and time.  Skin: Skin is warm and dry.  Psychiatric: She has a normal mood and affect. Her behavior is  normal. Judgment and thought content normal.    Vital signs in last 24 hours: @VSRANGES @  Labs:   Estimated body mass index is 35.78 kg/(m^2) as calculated from the following:   Height as of 05/14/15: 5\' 5"  (1.651 m).   Weight as of 05/14/15: 97.523 kg (215 lb).   Imaging Review Plain radiographs demonstrate severe degenerative joint disease of the right knee(s). The overall alignment ismild valgus. The bone quality appears to be fair for age and reported activity level.  Assessment/Plan:  End stage arthritis, right knee   The patient history, physical examination, clinical judgment of the provider and imaging studies are consistent with end stage degenerative joint disease of the right knee(s) and total knee arthroplasty is deemed medically necessary. The treatment options including medical management, injection therapy arthroscopy and arthroplasty were discussed at length. The risks and benefits of total knee arthroplasty were presented and reviewed. The risks due to aseptic loosening, infection, stiffness, patella tracking problems, thromboembolic complications and other imponderables were discussed. The patient acknowledged the explanation, agreed to proceed with the plan and consent was signed. Patient is being admitted for inpatient treatment for surgery, pain control, PT, OT, prophylactic antibiotics, VTE prophylaxis, progressive ambulation and ADL's and discharge planning. The patient is planning to be discharged home with home health services

## 2015-05-23 ENCOUNTER — Ambulatory Visit: Payer: Medicare Other | Admitting: Endocrinology

## 2015-05-24 ENCOUNTER — Encounter (HOSPITAL_COMMUNITY): Payer: Self-pay

## 2015-05-24 ENCOUNTER — Encounter (HOSPITAL_COMMUNITY)
Admission: RE | Admit: 2015-05-24 | Discharge: 2015-05-24 | Disposition: A | Discharge status: Home / Self Care | Payer: Medicare Other | Source: Ambulatory Visit | Attending: Orthopedic Surgery | Admitting: Orthopedic Surgery

## 2015-05-24 DIAGNOSIS — M179 Osteoarthritis of knee, unspecified: Secondary | ICD-10-CM | POA: Diagnosis not present

## 2015-05-24 DIAGNOSIS — Z0183 Encounter for blood typing: Secondary | ICD-10-CM | POA: Insufficient documentation

## 2015-05-24 DIAGNOSIS — Z01812 Encounter for preprocedural laboratory examination: Secondary | ICD-10-CM | POA: Diagnosis not present

## 2015-05-24 LAB — COMPREHENSIVE METABOLIC PANEL
ALK PHOS: 78 U/L (ref 38–126)
ALT: 16 U/L (ref 14–54)
ANION GAP: 7 (ref 5–15)
AST: 23 U/L (ref 15–41)
Albumin: 3.9 g/dL (ref 3.5–5.0)
BUN: 12 mg/dL (ref 6–20)
CALCIUM: 10 mg/dL (ref 8.9–10.3)
CO2: 28 mmol/L (ref 22–32)
Chloride: 106 mmol/L (ref 101–111)
Creatinine, Ser: 1.05 mg/dL — ABNORMAL HIGH (ref 0.44–1.00)
GFR calc non Af Amer: 52 mL/min — ABNORMAL LOW (ref >60)
Glucose, Bld: 86 mg/dL (ref 65–99)
Potassium: 4.5 mmol/L (ref 3.5–5.1)
SODIUM: 141 mmol/L (ref 135–145)
TOTAL PROTEIN: 7.1 g/dL (ref 6.5–8.1)
Total Bilirubin: 0.4 mg/dL (ref 0.3–1.2)

## 2015-05-24 LAB — CBC WITH DIFFERENTIAL/PLATELET
Basophils Absolute: 0 10*3/uL (ref 0.0–0.1)
Basophils Relative: 0 %
EOS ABS: 0.1 10*3/uL (ref 0.0–0.7)
EOS PCT: 2 %
HCT: 34.1 % — ABNORMAL LOW (ref 36.0–46.0)
HEMOGLOBIN: 10.9 g/dL — AB (ref 12.0–15.0)
LYMPHS ABS: 1.2 10*3/uL (ref 0.7–4.0)
Lymphocytes Relative: 38 %
MCH: 30.4 pg (ref 26.0–34.0)
MCHC: 32 g/dL (ref 30.0–36.0)
MCV: 95.3 fL (ref 78.0–100.0)
MONO ABS: 0.3 10*3/uL (ref 0.1–1.0)
MONOS PCT: 8 %
NEUTROS PCT: 52 %
Neutro Abs: 1.7 10*3/uL (ref 1.7–7.7)
Platelets: 224 10*3/uL (ref 150–400)
RBC: 3.58 MIL/uL — ABNORMAL LOW (ref 3.87–5.11)
RDW: 13.3 % (ref 11.5–15.5)
WBC: 3.2 10*3/uL — ABNORMAL LOW (ref 4.0–10.5)

## 2015-05-24 LAB — SURGICAL PCR SCREEN
MRSA, PCR: NEGATIVE
STAPHYLOCOCCUS AUREUS: NEGATIVE

## 2015-05-24 LAB — PROTIME-INR
INR: 0.95 (ref 0.00–1.49)
Prothrombin Time: 12.9 seconds (ref 11.6–15.2)

## 2015-05-24 LAB — APTT: aPTT: 29 seconds (ref 24–37)

## 2015-05-24 NOTE — Progress Notes (Addendum)
Was diagnosed with left breast cancer in 2013.  When she was informed of this dx, starting having some chest discomfort.  She saw Dr. Carter Kitten in 2013 and they did ran cardiac tests on her prior to her receiving chemo & radiation, but chest pains were thought to be "nerves" Denies any chest pains or cardiac issues now.  Had echo, lexiscan done in past (in [place]) Ekg was done last week at Dr. Chauncey Cruel. [place]'s office.  She also stated that 'long time ago' she was dx with diabetes.  Was on meds, but with weight loss and better eating habits, she doesn't "have" it anymore.  Takes no meds.  Last HGb A1c was in 12/2014 - 5.8 Left VM for Vira Agar to have MD look at labs.

## 2015-05-24 NOTE — Pre-Procedure Instructions (Signed)
[name]  05/24/2015      [id number] - [place], [name] [address] Phone: [phone] Fax: [phone]  [place], [place] [address] Phone: [phone] Fax: [phone]    Your procedure is scheduled on Wednesday, Jan. 4th   Report to [name] Admitting at 8:00 AM  Call this number if you have problems the morning of surgery:  [phone]   Remember:  Do not eat food or drink liquids after midnight Tuesday.   Take these medicines the morning of surgery with A SIP OF WATER : Neurontin, Levothyroxine, Oxycodone             STOP taking any herbal supplements, vitamins, anti-inflammatories 4-5 days prior to surgery.   Do not wear jewelry, make-up or nail polish.  Do not wear lotions, powders, or perfumes.  You may NOT wear deodorant the day of surgery.  Do not shave 48 hours prior to surgery.     Do not bring valuables to the hospital.  [name] is not responsible for any belongings or valuables.  Contacts, dentures or bridgework may not be worn into surgery.  Leave your suitcase in the car.  After surgery it may be brought to your room.  For patients admitted to the hospital, discharge time will be determined by your treatment team.  Name and phone number of your driver:     Please read over the following fact sheets that you were given. Pain Booklet, Coughing and Deep Breathing, Blood Transfusion Information, MRSA Information and Surgical Site Infection Prevention

## 2015-05-25 LAB — HEMOGLOBIN A1C
HEMOGLOBIN A1C: 5.8 % — AB (ref 4.8–5.6)
Mean Plasma Glucose: 120 mg/dL

## 2015-05-26 LAB — URINE CULTURE: Culture: 30000

## 2015-06-04 MED ORDER — CEFAZOLIN SODIUM-DEXTROSE 2-3 GM-% IV SOLR
2.0000 g | INTRAVENOUS | Status: AC
  Administered 2015-06-05: 2 g via INTRAVENOUS
  Filled 2015-06-04: qty 50

## 2015-06-04 MED ORDER — CHLORHEXIDINE GLUCONATE 4 % EX LIQD: 60.0000 mL | Freq: Once | CUTANEOUS | Status: DC

## 2015-06-04 MED ORDER — LACTATED RINGERS IV SOLN
INTRAVENOUS | Status: DC
  Administered 2015-06-05 (×3): via INTRAVENOUS

## 2015-06-05 ENCOUNTER — Encounter (HOSPITAL_COMMUNITY): Payer: Self-pay | Admitting: Certified Registered Nurse Anesthetist

## 2015-06-05 ENCOUNTER — Inpatient Hospital Stay (HOSPITAL_COMMUNITY): Payer: Medicare Other | Admitting: Certified Registered Nurse Anesthetist

## 2015-06-05 ENCOUNTER — Inpatient Hospital Stay (HOSPITAL_COMMUNITY): Payer: Medicare Other

## 2015-06-05 ENCOUNTER — Inpatient Hospital Stay (HOSPITAL_COMMUNITY)
Admission: RE | Admit: 2015-06-05 | Discharge: 2015-06-08 | DRG: 470 | Disposition: A | Discharge status: Home Health | Payer: Medicare Other | Source: Ambulatory Visit | Attending: Orthopedic Surgery | Admitting: Orthopedic Surgery

## 2015-06-05 ENCOUNTER — Encounter (HOSPITAL_COMMUNITY)
Admission: RE | Disposition: A | Discharge status: Home Health | Payer: Self-pay | Source: Ambulatory Visit | Attending: Orthopedic Surgery

## 2015-06-05 DIAGNOSIS — Z96641 Presence of right artificial hip joint: Secondary | ICD-10-CM | POA: Diagnosis not present

## 2015-06-05 DIAGNOSIS — Z853 Personal history of malignant neoplasm of breast: Secondary | ICD-10-CM

## 2015-06-05 DIAGNOSIS — Z8249 Family history of ischemic heart disease and other diseases of the circulatory system: Secondary | ICD-10-CM

## 2015-06-05 DIAGNOSIS — K449 Diaphragmatic hernia without obstruction or gangrene: Secondary | ICD-10-CM | POA: Diagnosis present

## 2015-06-05 DIAGNOSIS — K222 Esophageal obstruction: Secondary | ICD-10-CM | POA: Diagnosis not present

## 2015-06-05 DIAGNOSIS — M109 Gout, unspecified: Secondary | ICD-10-CM | POA: Diagnosis present

## 2015-06-05 DIAGNOSIS — Z87891 Personal history of nicotine dependence: Secondary | ICD-10-CM

## 2015-06-05 DIAGNOSIS — Z886 Allergy status to analgesic agent status: Secondary | ICD-10-CM | POA: Diagnosis not present

## 2015-06-05 DIAGNOSIS — E039 Hypothyroidism, unspecified: Secondary | ICD-10-CM | POA: Diagnosis not present

## 2015-06-05 DIAGNOSIS — Z885 Allergy status to narcotic agent status: Secondary | ICD-10-CM

## 2015-06-05 DIAGNOSIS — K219 Gastro-esophageal reflux disease without esophagitis: Secondary | ICD-10-CM | POA: Diagnosis not present

## 2015-06-05 DIAGNOSIS — E785 Hyperlipidemia, unspecified: Secondary | ICD-10-CM | POA: Diagnosis present

## 2015-06-05 DIAGNOSIS — Z923 Personal history of irradiation: Secondary | ICD-10-CM

## 2015-06-05 DIAGNOSIS — E1143 Type 2 diabetes mellitus with diabetic autonomic (poly)neuropathy: Secondary | ICD-10-CM | POA: Diagnosis present

## 2015-06-05 DIAGNOSIS — Z96659 Presence of unspecified artificial knee joint: Secondary | ICD-10-CM

## 2015-06-05 DIAGNOSIS — Z888 Allergy status to other drugs, medicaments and biological substances status: Secondary | ICD-10-CM

## 2015-06-05 DIAGNOSIS — M1710 Unilateral primary osteoarthritis, unspecified knee: Secondary | ICD-10-CM | POA: Diagnosis present

## 2015-06-05 DIAGNOSIS — Z471 Aftercare following joint replacement surgery: Secondary | ICD-10-CM | POA: Diagnosis not present

## 2015-06-05 DIAGNOSIS — Z800 Family history of malignant neoplasm of digestive organs: Secondary | ICD-10-CM | POA: Diagnosis not present

## 2015-06-05 DIAGNOSIS — K3184 Gastroparesis: Secondary | ICD-10-CM | POA: Diagnosis not present

## 2015-06-05 DIAGNOSIS — I10 Essential (primary) hypertension: Secondary | ICD-10-CM | POA: Diagnosis present

## 2015-06-05 DIAGNOSIS — M1711 Unilateral primary osteoarthritis, right knee: Principal | ICD-10-CM | POA: Diagnosis present

## 2015-06-05 DIAGNOSIS — M25561 Pain in right knee: Secondary | ICD-10-CM | POA: Diagnosis present

## 2015-06-05 DIAGNOSIS — M179 Osteoarthritis of knee, unspecified: Secondary | ICD-10-CM | POA: Diagnosis not present

## 2015-06-05 HISTORY — PX: TOTAL KNEE ARTHROPLASTY: SHX125

## 2015-06-05 LAB — GLUCOSE, CAPILLARY
GLUCOSE-CAPILLARY: 89 mg/dL (ref 65–99)
Glucose-Capillary: 66 mg/dL (ref 65–99)
Glucose-Capillary: 75 mg/dL (ref 65–99)

## 2015-06-05 SURGERY — ARTHROPLASTY, KNEE, TOTAL
Anesthesia: General | Site: Knee | Laterality: Right

## 2015-06-05 MED ORDER — ZOLPIDEM TARTRATE 5 MG PO TABS
5.0000 mg | ORAL_TABLET | Freq: Every evening | ORAL | Status: DC | PRN
  Administered 2015-06-06: 5 mg via ORAL
  Filled 2015-06-05: qty 1

## 2015-06-05 MED ORDER — GLYCOPYRROLATE 0.2 MG/ML IJ SOLN
INTRAMUSCULAR | Status: AC
  Filled 2015-06-05: qty 3

## 2015-06-05 MED ORDER — LEVOTHYROXINE SODIUM 75 MCG PO TABS
150.0000 ug | ORAL_TABLET | Freq: Every day | ORAL | Status: DC
  Administered 2015-06-06 – 2015-06-08 (×3): 150 ug via ORAL
  Filled 2015-06-05 (×3): qty 2

## 2015-06-05 MED ORDER — PROPOFOL 500 MG/50ML IV EMUL
INTRAVENOUS | Status: DC | PRN
  Administered 2015-06-05: 25 ug/kg/min via INTRAVENOUS

## 2015-06-05 MED ORDER — PHENYLEPHRINE HCL 10 MG/ML IJ SOLN
INTRAMUSCULAR | Status: DC | PRN
  Administered 2015-06-05: 120 ug via INTRAVENOUS
  Administered 2015-06-05: 80 ug via INTRAVENOUS
  Administered 2015-06-05: 120 ug via INTRAVENOUS
  Administered 2015-06-05: 80 ug via INTRAVENOUS

## 2015-06-05 MED ORDER — DIAZEPAM 2 MG PO TABS
2.0000 mg | ORAL_TABLET | Freq: Three times a day (TID) | ORAL | Status: DC | PRN
  Administered 2015-06-05 – 2015-06-06 (×3): 2 mg via ORAL
  Filled 2015-06-05 (×3): qty 1

## 2015-06-05 MED ORDER — GLYCOPYRROLATE 0.2 MG/ML IJ SOLN
INTRAMUSCULAR | Status: DC | PRN
  Administered 2015-06-05: 0.1 mg via INTRAVENOUS
  Administered 2015-06-05: .7 mg via INTRAVENOUS

## 2015-06-05 MED ORDER — NEOSTIGMINE METHYLSULFATE 10 MG/10ML IV SOLN
INTRAVENOUS | Status: DC | PRN
  Administered 2015-06-05: 5 mg via INTRAVENOUS

## 2015-06-05 MED ORDER — ARTIFICIAL TEARS OP OINT
TOPICAL_OINTMENT | OPHTHALMIC | Status: DC | PRN
  Administered 2015-06-05: 1 via OPHTHALMIC

## 2015-06-05 MED ORDER — DIAZEPAM 2 MG PO TABS: 2.0000 mg | ORAL_TABLET | Freq: Three times a day (TID) | ORAL | Status: DC | PRN

## 2015-06-05 MED ORDER — SACCHAROMYCES BOULARDII 250 MG PO CAPS
250.0000 mg | ORAL_CAPSULE | Freq: Two times a day (BID) | ORAL | Status: DC
  Administered 2015-06-05 – 2015-06-08 (×6): 250 mg via ORAL
  Filled 2015-06-05 (×6): qty 1

## 2015-06-05 MED ORDER — MAGNESIUM CHLORIDE 64 MG PO TBEC
1.0000 | DELAYED_RELEASE_TABLET | Freq: Three times a day (TID) | ORAL | Status: DC
  Administered 2015-06-06 – 2015-06-08 (×7): 64 mg via ORAL
  Filled 2015-06-05 (×11): qty 1

## 2015-06-05 MED ORDER — BUPIVACAINE LIPOSOME 1.3 % IJ SUSP
20.0000 mL | INTRAMUSCULAR | Status: AC
  Administered 2015-06-05: 20 mL
  Filled 2015-06-05: qty 20

## 2015-06-05 MED ORDER — HYDROMORPHONE HCL 1 MG/ML IJ SOLN
0.5000 mg | INTRAMUSCULAR | Status: DC | PRN
  Administered 2015-06-05: 0.5 mg via INTRAVENOUS
  Administered 2015-06-05: 1 mg via INTRAVENOUS
  Administered 2015-06-05: 0.5 mg via INTRAVENOUS
  Administered 2015-06-05 – 2015-06-07 (×12): 1 mg via INTRAVENOUS
  Filled 2015-06-05 (×12): qty 1

## 2015-06-05 MED ORDER — FLUOROMETHOLONE 0.1 % OP SUSP
1.0000 [drp] | Freq: Two times a day (BID) | OPHTHALMIC | Status: DC | PRN
  Filled 2015-06-05: qty 5

## 2015-06-05 MED ORDER — DOCUSATE SODIUM 100 MG PO CAPS
100.0000 mg | ORAL_CAPSULE | Freq: Two times a day (BID) | ORAL | Status: DC
  Administered 2015-06-06 – 2015-06-08 (×2): 100 mg via ORAL
  Filled 2015-06-05 (×6): qty 1

## 2015-06-05 MED ORDER — MAGNESIUM OXIDE 400 (241.3 MG) MG PO TABS
400.0000 mg | ORAL_TABLET | Freq: Three times a day (TID) | ORAL | Status: DC
  Administered 2015-06-05 – 2015-06-08 (×9): 400 mg via ORAL
  Filled 2015-06-05 (×8): qty 1

## 2015-06-05 MED ORDER — MAGNESIUM CITRATE PO SOLN
1.0000 | Freq: Once | ORAL | Status: DC | PRN
Start: 2015-06-05 — End: 2015-06-08

## 2015-06-05 MED ORDER — GLYCOPYRROLATE 0.2 MG/ML IJ SOLN
INTRAMUSCULAR | Status: AC
  Filled 2015-06-05: qty 1

## 2015-06-05 MED ORDER — LIDOCAINE HCL (CARDIAC) 20 MG/ML IV SOLN
INTRAVENOUS | Status: AC
  Filled 2015-06-05: qty 5

## 2015-06-05 MED ORDER — ONDANSETRON HCL 4 MG/2ML IJ SOLN
INTRAMUSCULAR | Status: DC | PRN
  Administered 2015-06-05: 4 mg via INTRAVENOUS

## 2015-06-05 MED ORDER — APIXABAN 2.5 MG PO TABS
2.5000 mg | ORAL_TABLET | Freq: Two times a day (BID) | ORAL | Status: DC
  Administered 2015-06-06 – 2015-06-08 (×5): 2.5 mg via ORAL
  Filled 2015-06-05 (×5): qty 1

## 2015-06-05 MED ORDER — DEXAMETHASONE SODIUM PHOSPHATE 10 MG/ML IJ SOLN
INTRAMUSCULAR | Status: AC
  Filled 2015-06-05: qty 1

## 2015-06-05 MED ORDER — HYDROMORPHONE HCL 1 MG/ML IJ SOLN
INTRAMUSCULAR | Status: AC
  Administered 2015-06-05: 0.5 mg via INTRAVENOUS
  Filled 2015-06-05: qty 1

## 2015-06-05 MED ORDER — PANTOPRAZOLE SODIUM 40 MG PO TBEC
40.0000 mg | DELAYED_RELEASE_TABLET | Freq: Two times a day (BID) | ORAL | Status: DC
  Administered 2015-06-05 – 2015-06-08 (×6): 40 mg via ORAL
  Filled 2015-06-05 (×6): qty 1

## 2015-06-05 MED ORDER — ROCURONIUM BROMIDE 50 MG/5ML IV SOLN
INTRAVENOUS | Status: AC
  Filled 2015-06-05: qty 1

## 2015-06-05 MED ORDER — NEOSTIGMINE METHYLSULFATE 10 MG/10ML IV SOLN
INTRAVENOUS | Status: AC
  Filled 2015-06-05: qty 1

## 2015-06-05 MED ORDER — DEXTROSE 50 % IV SOLN
INTRAVENOUS | Status: AC
  Filled 2015-06-05: qty 50

## 2015-06-05 MED ORDER — BISACODYL 10 MG RE SUPP: 10.0000 mg | Freq: Every day | RECTAL | Status: DC | PRN

## 2015-06-05 MED ORDER — DIPHENHYDRAMINE HCL 12.5 MG/5ML PO ELIX: 12.5000 mg | ORAL_SOLUTION | ORAL | Status: DC | PRN

## 2015-06-05 MED ORDER — ONDANSETRON HCL 4 MG/2ML IJ SOLN
4.0000 mg | Freq: Four times a day (QID) | INTRAMUSCULAR | Status: DC | PRN
  Filled 2015-06-05: qty 2

## 2015-06-05 MED ORDER — DEXAMETHASONE SODIUM PHOSPHATE 10 MG/ML IJ SOLN
10.0000 mg | Freq: Once | INTRAMUSCULAR | Status: AC
  Administered 2015-06-06: 10 mg via INTRAVENOUS
  Filled 2015-06-05: qty 1

## 2015-06-05 MED ORDER — POLYETHYLENE GLYCOL 3350 17 G PO PACK
17.0000 g | PACK | Freq: Every day | ORAL | Status: DC | PRN
Start: 2015-06-05 — End: 2015-06-08

## 2015-06-05 MED ORDER — PROMETHAZINE HCL 25 MG/ML IJ SOLN: 6.2500 mg | INTRAMUSCULAR | Status: DC | PRN

## 2015-06-05 MED ORDER — PHENYLEPHRINE HCL 10 MG/ML IJ SOLN
10.0000 mg | INTRAVENOUS | Status: DC | PRN
  Administered 2015-06-05: 50 ug/min via INTRAVENOUS

## 2015-06-05 MED ORDER — DEXTROSE 50 % IV SOLN: 25.0000 mL | Freq: Once | INTRAVENOUS | Status: DC

## 2015-06-05 MED ORDER — LACTATED RINGERS IV SOLN
INTRAVENOUS | Status: DC
  Administered 2015-06-05: 09:00:00 via INTRAVENOUS

## 2015-06-05 MED ORDER — CEFAZOLIN SODIUM-DEXTROSE 2-3 GM-% IV SOLR
2.0000 g | Freq: Four times a day (QID) | INTRAVENOUS | Status: AC
  Administered 2015-06-05 (×2): 2 g via INTRAVENOUS
  Filled 2015-06-05 (×2): qty 50

## 2015-06-05 MED ORDER — 0.9 % SODIUM CHLORIDE (POUR BTL) OPTIME
TOPICAL | Status: DC | PRN
  Administered 2015-06-05: 1000 mL

## 2015-06-05 MED ORDER — ARTIFICIAL TEARS OP OINT
TOPICAL_OINTMENT | OPHTHALMIC | Status: AC
  Filled 2015-06-05: qty 3.5

## 2015-06-05 MED ORDER — MIDAZOLAM HCL 2 MG/2ML IJ SOLN
INTRAMUSCULAR | Status: DC | PRN
  Administered 2015-06-05 (×2): 1 mg via INTRAVENOUS

## 2015-06-05 MED ORDER — POTASSIUM CHLORIDE IN NACL 20-0.9 MEQ/L-% IV SOLN
INTRAVENOUS | Status: DC
  Administered 2015-06-05 – 2015-06-06 (×2): via INTRAVENOUS
  Administered 2015-06-06: 100 mL/h via INTRAVENOUS
  Filled 2015-06-05 (×3): qty 1000

## 2015-06-05 MED ORDER — HYDROMORPHONE HCL 1 MG/ML IJ SOLN
INTRAMUSCULAR | Status: AC
  Filled 2015-06-05: qty 1

## 2015-06-05 MED ORDER — PRAVASTATIN SODIUM 20 MG PO TABS
20.0000 mg | ORAL_TABLET | Freq: Every day | ORAL | Status: DC
  Administered 2015-06-05 – 2015-06-07 (×3): 20 mg via ORAL
  Filled 2015-06-05 (×3): qty 1

## 2015-06-05 MED ORDER — PROPOFOL 10 MG/ML IV BOLUS
INTRAVENOUS | Status: AC
  Filled 2015-06-05: qty 20

## 2015-06-05 MED ORDER — HYDROCODONE-ACETAMINOPHEN 7.5-325 MG PO TABS
1.0000 | ORAL_TABLET | ORAL | Status: DC | PRN
  Administered 2015-06-05 – 2015-06-06 (×3): 2 via ORAL
  Filled 2015-06-05 (×3): qty 2

## 2015-06-05 MED ORDER — VITAMIN B-1 100 MG PO TABS
100.0000 mg | ORAL_TABLET | Freq: Every day | ORAL | Status: DC
  Administered 2015-06-06 – 2015-06-08 (×3): 100 mg via ORAL
  Filled 2015-06-05 (×3): qty 1

## 2015-06-05 MED ORDER — PROPOFOL 10 MG/ML IV BOLUS
INTRAVENOUS | Status: DC | PRN
  Administered 2015-06-05: 170 mg via INTRAVENOUS
  Administered 2015-06-05: 30 mg via INTRAVENOUS

## 2015-06-05 MED ORDER — LIDOCAINE HCL (CARDIAC) 20 MG/ML IV SOLN
INTRAVENOUS | Status: DC | PRN
  Administered 2015-06-05: 80 mg via INTRATRACHEAL

## 2015-06-05 MED ORDER — SODIUM CHLORIDE 0.9 % IV SOLN
1000.0000 mg | INTRAVENOUS | Status: AC
  Administered 2015-06-05: 1000 mg via INTRAVENOUS
  Filled 2015-06-05: qty 10

## 2015-06-05 MED ORDER — FERROUS SULFATE 325 (65 FE) MG PO TABS
325.0000 mg | ORAL_TABLET | Freq: Every day | ORAL | Status: DC
  Administered 2015-06-06 – 2015-06-08 (×3): 325 mg via ORAL
  Filled 2015-06-05 (×3): qty 1

## 2015-06-05 MED ORDER — ROCURONIUM BROMIDE 100 MG/10ML IV SOLN
INTRAVENOUS | Status: DC | PRN
  Administered 2015-06-05: 40 mg via INTRAVENOUS

## 2015-06-05 MED ORDER — METOCLOPRAMIDE HCL 5 MG/ML IJ SOLN: 5.0000 mg | Freq: Three times a day (TID) | INTRAMUSCULAR | Status: DC | PRN

## 2015-06-05 MED ORDER — MENTHOL 3 MG MT LOZG
1.0000 | LOZENGE | OROMUCOSAL | Status: DC | PRN
  Filled 2015-06-05: qty 9

## 2015-06-05 MED ORDER — FENTANYL CITRATE (PF) 250 MCG/5ML IJ SOLN
INTRAMUSCULAR | Status: DC | PRN
  Administered 2015-06-05: 25 ug via INTRAVENOUS
  Administered 2015-06-05: 50 ug via INTRAVENOUS
  Administered 2015-06-05: 100 ug via INTRAVENOUS
  Administered 2015-06-05: 25 ug via INTRAVENOUS
  Administered 2015-06-05: 50 ug via INTRAVENOUS

## 2015-06-05 MED ORDER — CALCIUM CARBONATE ANTACID 500 MG PO CHEW: 1.0000 | CHEWABLE_TABLET | ORAL | Status: DC | PRN

## 2015-06-05 MED ORDER — DEXAMETHASONE SODIUM PHOSPHATE 10 MG/ML IJ SOLN
INTRAMUSCULAR | Status: DC | PRN
  Administered 2015-06-05: 10 mg via INTRAVENOUS

## 2015-06-05 MED ORDER — HYDROMORPHONE HCL 1 MG/ML IJ SOLN
0.2500 mg | INTRAMUSCULAR | Status: DC | PRN
  Administered 2015-06-05 (×4): 0.5 mg via INTRAVENOUS

## 2015-06-05 MED ORDER — MIDAZOLAM HCL 2 MG/2ML IJ SOLN
INTRAMUSCULAR | Status: AC
  Filled 2015-06-05: qty 2

## 2015-06-05 MED ORDER — METOCLOPRAMIDE HCL 5 MG PO TABS: 5.0000 mg | ORAL_TABLET | Freq: Three times a day (TID) | ORAL | Status: DC | PRN

## 2015-06-05 MED ORDER — ONDANSETRON HCL 4 MG/2ML IJ SOLN
INTRAMUSCULAR | Status: AC
  Filled 2015-06-05: qty 2

## 2015-06-05 MED ORDER — AMILORIDE HCL 5 MG PO TABS
5.0000 mg | ORAL_TABLET | Freq: Two times a day (BID) | ORAL | Status: DC
  Administered 2015-06-06 – 2015-06-08 (×5): 5 mg via ORAL
  Filled 2015-06-05 (×8): qty 1

## 2015-06-05 MED ORDER — BUPIVACAINE HCL (PF) 0.5 % IJ SOLN
INTRAMUSCULAR | Status: AC
  Filled 2015-06-05: qty 30

## 2015-06-05 MED ORDER — FENTANYL CITRATE (PF) 250 MCG/5ML IJ SOLN
INTRAMUSCULAR | Status: AC
  Filled 2015-06-05: qty 5

## 2015-06-05 MED ORDER — PHENOL 1.4 % MT LIQD: 1.0000 | OROMUCOSAL | Status: DC | PRN

## 2015-06-05 MED ORDER — BUPIVACAINE HCL 0.5 % IJ SOLN
INTRAMUSCULAR | Status: DC | PRN
  Administered 2015-06-05: 10 mL

## 2015-06-05 MED ORDER — ONDANSETRON HCL 4 MG PO TABS: 4.0000 mg | ORAL_TABLET | Freq: Four times a day (QID) | ORAL | Status: DC | PRN

## 2015-06-05 MED ORDER — HYDROMORPHONE HCL 1 MG/ML IJ SOLN
INTRAMUSCULAR | Status: AC
Start: 2015-06-05 — End: 2015-06-06
  Filled 2015-06-05: qty 1

## 2015-06-05 MED ORDER — SODIUM CHLORIDE 0.9 % IR SOLN
Status: DC | PRN
  Administered 2015-06-05 (×2): 1000 mL

## 2015-06-05 MED ORDER — HYDROCODONE-ACETAMINOPHEN 7.5-325 MG PO TABS: 1.0000 | ORAL_TABLET | ORAL | Status: DC | PRN

## 2015-06-05 MED ORDER — APIXABAN 2.5 MG PO TABS: ORAL_TABLET | ORAL | Status: DC

## 2015-06-05 MED ORDER — GABAPENTIN 300 MG PO CAPS
300.0000 mg | ORAL_CAPSULE | Freq: Three times a day (TID) | ORAL | Status: DC
  Administered 2015-06-05 – 2015-06-08 (×9): 300 mg via ORAL
  Filled 2015-06-05 (×9): qty 1

## 2015-06-05 SURGICAL SUPPLY — 71 items
APL SKNCLS STERI-STRIP NONHPOA (GAUZE/BANDAGES/DRESSINGS) ×1
BANDAGE ACE 4X5 VEL STRL LF (GAUZE/BANDAGES/DRESSINGS) ×3 IMPLANT
BANDAGE ACE 6X5 VEL STRL LF (GAUZE/BANDAGES/DRESSINGS) ×2 IMPLANT
BANDAGE ELASTIC 4 VELCRO ST LF (GAUZE/BANDAGES/DRESSINGS) ×3 IMPLANT
BANDAGE ELASTIC 6 VELCRO ST LF (GAUZE/BANDAGES/DRESSINGS) ×3 IMPLANT
BANDAGE ESMARK 6X9 LF (GAUZE/BANDAGES/DRESSINGS) ×1 IMPLANT
BENZOIN TINCTURE PRP APPL 2/3 (GAUZE/BANDAGES/DRESSINGS) ×3 IMPLANT
BLADE SAG 18X100X1.27 (BLADE) ×6 IMPLANT
BNDG CMPR 9X6 STRL LF SNTH (GAUZE/BANDAGES/DRESSINGS) ×1
BNDG ESMARK 6X9 LF (GAUZE/BANDAGES/DRESSINGS) ×3
BOWL SMART MIX CTS (DISPOSABLE) ×3 IMPLANT
CAPT KNEE TOTAL 3 ×2 IMPLANT
CEMENT BONE SIMPLEX SPEEDSET (Cement) ×6 IMPLANT
CLOSURE WOUND 1/2 X4 (GAUZE/BANDAGES/DRESSINGS) ×1
COVER SURGICAL LIGHT HANDLE (MISCELLANEOUS) ×3 IMPLANT
CUFF TOURNIQUET SINGLE 34IN LL (TOURNIQUET CUFF) ×3 IMPLANT
DRAPE EXTREMITY T 121X128X90 (DRAPE) ×3 IMPLANT
DRAPE IMP U-DRAPE 54X76 (DRAPES) ×3 IMPLANT
DRAPE PROXIMA HALF (DRAPES) ×3 IMPLANT
DRAPE U-SHAPE 47X51 STRL (DRAPES) ×3 IMPLANT
DRSG ADAPTIC 3X8 NADH LF (GAUZE/BANDAGES/DRESSINGS) ×2 IMPLANT
DRSG PAD ABDOMINAL 8X10 ST (GAUZE/BANDAGES/DRESSINGS) ×3 IMPLANT
DURAPREP 26ML APPLICATOR (WOUND CARE) ×4 IMPLANT
ELECT CAUTERY BLADE 6.4 (BLADE) ×3 IMPLANT
ELECT REM PT RETURN 9FT ADLT (ELECTROSURGICAL) ×3
ELECTRODE REM PT RTRN 9FT ADLT (ELECTROSURGICAL) ×1 IMPLANT
EVACUATOR 1/8 PVC DRAIN (DRAIN) ×3 IMPLANT
FACESHIELD WRAPAROUND (MASK) ×6 IMPLANT
FACESHIELD WRAPAROUND OR TEAM (MASK) ×2 IMPLANT
GAUZE SPONGE 4X4 12PLY STRL (GAUZE/BANDAGES/DRESSINGS) ×3 IMPLANT
GLOVE BIOGEL PI IND STRL 7.0 (GLOVE) ×1 IMPLANT
GLOVE BIOGEL PI INDICATOR 7.0 (GLOVE) ×2
GLOVE ORTHO TXT STRL SZ7.5 (GLOVE) ×3 IMPLANT
GLOVE SURG ORTHO 7.0 STRL STRW (GLOVE) ×3 IMPLANT
GOWN STRL REUS W/ TWL LRG LVL3 (GOWN DISPOSABLE) ×2 IMPLANT
GOWN STRL REUS W/ TWL XL LVL3 (GOWN DISPOSABLE) ×2 IMPLANT
GOWN STRL REUS W/TWL LRG LVL3 (GOWN DISPOSABLE) ×6
GOWN STRL REUS W/TWL XL LVL3 (GOWN DISPOSABLE) ×6
HANDPIECE INTERPULSE COAX TIP (DISPOSABLE) ×3
IMMOBILIZER KNEE 22 UNIV (SOFTGOODS) ×3 IMPLANT
IMMOBILIZER KNEE 24 THIGH 36 (MISCELLANEOUS) IMPLANT
IMMOBILIZER KNEE 24 UNIV (MISCELLANEOUS)
KIT BASIN OR (CUSTOM PROCEDURE TRAY) ×3 IMPLANT
KIT ROOM TURNOVER OR (KITS) ×3 IMPLANT
MANIFOLD NEPTUNE II (INSTRUMENTS) ×3 IMPLANT
NDL 18GX1X1/2 (RX/OR ONLY) (NEEDLE) ×1 IMPLANT
NDL HYPO 25GX1X1/2 BEV (NEEDLE) ×1 IMPLANT
NEEDLE 18GX1X1/2 (RX/OR ONLY) (NEEDLE) ×3 IMPLANT
NEEDLE HYPO 25GX1X1/2 BEV (NEEDLE) ×3 IMPLANT
NS IRRIG 1000ML POUR BTL (IV SOLUTION) ×3 IMPLANT
PACK TOTAL JOINT (CUSTOM PROCEDURE TRAY) ×3 IMPLANT
PACK UNIVERSAL I (CUSTOM PROCEDURE TRAY) ×3 IMPLANT
PAD ARMBOARD 7.5X6 YLW CONV (MISCELLANEOUS) ×6 IMPLANT
PAD CAST 4YDX4 CTTN HI CHSV (CAST SUPPLIES) ×1 IMPLANT
PADDING CAST COTTON 4X4 STRL (CAST SUPPLIES) ×3
PADDING CAST COTTON 6X4 STRL (CAST SUPPLIES) ×2 IMPLANT
SET HNDPC FAN SPRY TIP SCT (DISPOSABLE) ×1 IMPLANT
STRIP CLOSURE SKIN 1/2X4 (GAUZE/BANDAGES/DRESSINGS) ×3 IMPLANT
SUCTION FRAZIER TIP 10 FR DISP (SUCTIONS) ×3 IMPLANT
SUT MNCRL AB 4-0 PS2 18 (SUTURE) ×3 IMPLANT
SUT VIC AB 0 CT1 27 (SUTURE) ×6
SUT VIC AB 0 CT1 27XBRD ANBCTR (SUTURE) IMPLANT
SUT VIC AB 1 CTX 36 (SUTURE) ×6
SUT VIC AB 1 CTX36XBRD ANBCTR (SUTURE) ×1 IMPLANT
SUT VIC AB 2-0 CT1 27 (SUTURE) ×6
SUT VIC AB 2-0 CT1 TAPERPNT 27 (SUTURE) ×2 IMPLANT
SYR 50ML LL SCALE MARK (SYRINGE) ×3 IMPLANT
SYR CONTROL 10ML LL (SYRINGE) ×3 IMPLANT
TOWEL OR 17X24 6PK STRL BLUE (TOWEL DISPOSABLE) ×3 IMPLANT
TOWEL OR 17X26 10 PK STRL BLUE (TOWEL DISPOSABLE) ×3 IMPLANT
TRAY CATH 16FR W/PLASTIC CATH (SET/KITS/TRAYS/PACK) ×3 IMPLANT

## 2015-06-05 NOTE — Op Note (Signed)
NAMEIDALIS, STCYR NO.:  [id number]  MEDICAL RECORD NO.:  [id number]  LOCATION:  [facility]                         FACILITY:  [facility]  PHYSICIAN:  Ninetta Lights, M.D. DATE OF BIRTH:  [birth year]  DATE OF PROCEDURE:  06/05/2015 DATE OF DISCHARGE:                              OPERATIVE REPORT   PREOPERATIVE DIAGNOSIS:  Left knee end-stage arthritis, primary localized.  POSTOPERATIVE DIAGNOSIS:  Left knee end-stage arthritis, primary localized.  PROCEDURE:  Left knee modified minimally invasive total knee replacement Stryker Triathlon prosthesis.  A cemented pegged cruciate retaining #4 femoral component.  Cemented #5 tibial component and 9 mm CS insert. Cemented resurfacing 35-mm patellar component.  SURGEON:  Ninetta Lights, MD  ASSISTANT:  Elmyra Ricks, PA, present throughout the entire case and necessary for timely completion of procedure.  ANESTHESIA:  General.  BLOOD LOSS:  Minimal.  SPECIMENS:  None.  COUNTS:  None.  COMPLICATIONS:  None.  DRESSINGS:  Soft compressive knee immobilizer.  TOURNIQUET TIME:  45 minutes.  PROCEDURE IN DETAIL:  Patient was brought to the operating room, placed on the operating table in supine position.  After adequate anesthesia had been obtained, tourniquet applied, prepped and draped in usual sterile fashion.  Exsanguinated with elevation of Esmarch.  Tourniquet inflated to 350 mmHg.  Longitudinal incision above the patella down to tibial tubercle.  Skin and subcutaneous tissue divided.  Medial arthrotomy, vastus splitting.  Intramedullary guide, flexible rod distal femur.  6 degrees of valgus.  8 mm resection.  Using epicondylar axis, the femur was sized, cut, and fitted for a pegged cruciate retaining #4 component.  Extramedullary guide, proximal tibia.  A 3-degree posterior slope cut.  Size #5 component.  Patella exposed.  Posterior 10 mm removed.  Drilled, sized, and fitted for a 35 mm patella.  Trial  was put in place.  The 9 mm CS insert on the tibia.  With this construct, I was pleased with biomechanical axis.  Good stability.  Good tracking.  Good biomechanical axis.  Tibia was marked for rotation and then hand reamed. All trials removed.  Copious irrigation with pulse irrigating device. Cement prepared, placed on all components, firmly seated.  Polyethylene attached to tibia, knee reduced.  Patella held with clamp.  Once the cement was hardened, the knee was irrigated again.  Soft tissue was injected with Exparel. Arthrotomy was closed with #1 Vicryl.  Skin and subcutaneous closure. Margins were injected with Marcaine.  Sterile compressive dressing applied.  Knee immobilizer applied.  Tourniquet deflated and removed. Anesthesia reversed.  Brought to the recovery room.  Tolerated the surgery well.  No complications.     Ninetta Lights, M.D.     DFM/MEDQ  D:  06/05/2015  T:  06/05/2015  Job:  [id number]

## 2015-06-05 NOTE — Anesthesia Postprocedure Evaluation (Signed)
Anesthesia Post Note  Patient: [name]  Procedure(s) Performed: Procedure(s) (LRB): RIGHT TOTAL KNEE ARTHROPLASTY (Right)  Patient location during evaluation: PACU Anesthesia Type: General Level of consciousness: awake and alert Pain management: satisfactory to patient Vital Signs Assessment: post-procedure vital signs reviewed and stable Respiratory status: spontaneous breathing, nonlabored ventilation, respiratory function stable and patient connected to nasal cannula oxygen Cardiovascular status: blood pressure returned to baseline and stable Postop Assessment: no signs of nausea or vomiting Anesthetic complications: no    Last Vitals:  Filed Vitals:   06/05/15 1215 06/05/15 1230  BP:  125/80  Pulse: 66 64  Temp:    Resp: 13 12    Last Pain:  Filed Vitals:   06/05/15 1237  PainSc: Asleep                 Darrien Belter,JAMES TERRILL

## 2015-06-05 NOTE — Progress Notes (Signed)
Orthopedic Tech Progress Note Patient Details:  [name] 03/26/1946 [id number] On cpm at 1900 Patient ID: [name], female   DOB: 03-01-1946, 70 y.o.   MRN: [id number]   Braulio Bosch 06/05/2015, 6:55 PM

## 2015-06-05 NOTE — Evaluation (Signed)
Physical Therapy Evaluation Patient Details Name: [name] MRN: [id number] DOB: [birth year] Today's Date: 06/05/2015   History of Present Illness  Patient is a 70 y/o female admitted with OA R knee now s/p TKA.  PMH positive for OA, obesity, DM,  diverticulitis, c-diff, breast CA with L mastectomy, gout, UTI, GI bleed.  Clinical Impression  Patient presents currently mod A for mobility due to pain and limited activity tolerance and will benefit from skilled PT in the acute setting to allow return home with family assist.  Pt was independent prior to admission.    Follow Up Recommendations Home health PT;Supervision for mobility/OOB    Equipment Recommendations  None recommended by PT    Recommendations for Other Services       Precautions / Restrictions Precautions Precautions: Fall;Knee Required Braces or Orthoses: Knee Immobilizer - Right Restrictions Weight Bearing Restrictions: Yes RLE Weight Bearing: Weight bearing as tolerated      Mobility  Bed Mobility Overal bed mobility: Needs Assistance Bed Mobility: Sit to Supine       Sit to supine: Mod assist   General bed mobility comments: assist for legs into bed, pt able to scoot up to [facility] with rails  Transfers Overall transfer level: Needs assistance Equipment used: Rolling walker (2 wheeled) Transfers: Sit to/from [facility] Sit to Stand: Mod assist Stand pivot transfers: Mod assist       General transfer comment: up from [facility], pivotal steps to bed with cues for walker use, WBAT and lifting help  Ambulation/Gait                Stairs            Wheelchair Mobility    Modified Rankin (Stroke Patients Only)       Balance Overall balance assessment: Needs assistance   Sitting balance-Leahy Scale: Fair     Standing balance support: Bilateral upper extremity supported Standing balance-Leahy Scale: Poor Standing balance comment: flexed posture and heavy UE support  on walker                             Pertinent Vitals/Pain Pain Assessment: Faces Faces Pain Scale: Hurts whole lot Pain Location: R knee Pain Descriptors / Indicators: Sore;Aching Pain Intervention(s): Patient requesting pain meds-RN notified;Ice applied    Home Living Family/patient expects to be discharged to:: Private residence Living Arrangements: Spouse/significant other Available Help at Discharge: Family Type of Home: House Home Access: Stairs to enter Entrance Stairs-Rails: Psychiatric nurse of Steps: 2 Home Layout: One level Home Equipment: Environmental consultant - 2 wheels;Shower seat;Bedside commode      Prior Function Level of Independence: Independent               Hand Dominance        Extremity/Trunk Assessment               Lower Extremity Assessment: RLE deficits/detail RLE Deficits / Details: limited by pain, wearing knee immobilizer and up on BSC initially, too painful to test after back to bed       Communication   Communication: No difficulties  Cognition Arousal/Alertness: Awake/alert Behavior During Therapy: WFL for tasks assessed/performed Overall Cognitive Status: Within Functional Limits for tasks assessed                      General Comments      Exercises        Assessment/Plan  PT Assessment Patient needs continued PT services  PT Diagnosis Difficulty walking;Acute pain   PT Problem List Decreased strength;Decreased range of motion;Decreased activity tolerance;Decreased balance;Decreased mobility;Decreased knowledge of precautions;Decreased knowledge of use of DME  PT Treatment Interventions DME instruction;Gait training;Stair training;Functional mobility training;Patient/family education;Therapeutic activities;Therapeutic exercise;Modalities   PT Goals (Current goals can be found in the Care Plan section) Acute Rehab PT Goals Patient Stated Goal: decrease pain PT Goal Formulation: With  patient Time For Goal Achievement: 06/12/15 Potential to Achieve Goals: Good    Frequency 7X/week   Barriers to discharge        Co-evaluation               End of Session   Activity Tolerance: Patient limited by pain Patient left: in bed;with call bell/phone within reach;with family/visitor present           Time: [id number] PT Time Calculation (min) (ACUTE ONLY): 20 min   Charges:   PT Evaluation $PT Eval Moderate Complexity: 1 Procedure     PT G CodesReginia Naas 2015-06-26, 4:29 PM  Magda Kiel, [place] 06/26/2015

## 2015-06-05 NOTE — H&P (View-Only) (Signed)
TOTAL KNEE ADMISSION H&P  Patient is being admitted for right total knee arthroplasty.  Subjective:  Chief Complaint:right knee pain.  HPI: [name], 70 y.o. female, has a history of pain and functional disability in the right knee due to arthritis and has failed non-surgical conservative treatments for greater than 12 weeks to includeNSAID's and/or analgesics and corticosteriod injections.  Onset of symptoms was gradual, starting 5 years ago with gradually worsening course since that time. The patient noted no past surgery on the right knee(s).  Patient currently rates pain in the right knee(s) at 10 out of 10 with activity. Patient has night pain, worsening of pain with activity and weight bearing, pain that interferes with activities of daily living, pain with passive range of motion, crepitus and joint swelling.  Patient has evidence of subchondral sclerosis and joint space narrowing by imaging studies. There is no active infection.  Patient Active Problem List   Diagnosis Date Noted  . UTI (urinary tract infection) 11/27/2014  . UTI (lower urinary tract infection) 11/27/2014  . Acute cystitis with hematuria 11/13/2014  . Hemorrhoids, external 11/04/2014  . Wellness examination 10/03/2014  . Pain in joint, shoulder region 09/24/2014  . Cramp in limb 06/26/2014  . Rectal bleeding 05/02/2014  . External hemorrhoids 05/02/2014  . Hemorrhoids without complication [id number]  . Pain in joint, lower leg 02/01/2014  . Vitamin D deficiency 01/16/2014  . Gastroparesis 09/14/2013  . Other dysphagia 09/04/2013  . Nausea alone 09/04/2013  . Iron deficiency anemia, unspecified 08/28/2013  . Hypomagnesemia 05/11/2013  . Hypokalemia 05/11/2013  . Generalized weakness 05/11/2013  . Dehydration 11/25/2011  . Fatigue 11/16/2011  . Breast cancer ([place]) 09/29/2011  . Family history of breast cancer 08/19/2011  . Primary cancer of lower-inner quadrant of left female breast ([place])  08/17/2011  . Neck pain on left side 07/29/2011  . Routine general medical examination at a health care facility 06/28/2011  . Edema 11/26/2010  . CLOSTRIDIUM DIFFICILE COLITIS 02/07/2010  . Blind loop syndrome 10/01/2009  . Abdominal pain 10/01/2009  . Vitamin B 12 deficiency 08/30/2009  . 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 GOITER, MULTINODULAR 04/02/2009  . CONTACT DERMATITIS&OTHER ECZEMA DUE UNSPEC CAUSE 02/20/2009  . URINARY CALCULUS 10/11/2008  . ASYMPTOMATIC POSTMENOPAUSAL STATUS 10/11/2008  . BACK PAIN, CHRONIC 07/03/2008  . Esophageal reflux 06/12/2008  . Diarrhea 06/12/2008  . ABDOMINAL PAIN-RUQ 05/17/2008  . Diabetes ([place]) 12/10/2006  . HYPERLIPIDEMIA 12/10/2006  . Gout 12/10/2006  . HYPERTENSION 12/10/2006  . DIVERTICULOSIS, COLON 12/10/2006  . OSTEOARTHRITIS 12/10/2006  . ESOPHAGEAL STRICTURE 08/23/2002  . HIATAL HERNIA 08/23/2002  . INTERNAL HEMORRHOIDS 02/02/2001   Past Medical History  Diagnosis Date  . Obesity   . PUD (peptic ulcer disease)   . Leukopenia   . Short bowel syndrome   . Internal hemorrhoids without mention of complication   . Stricture and stenosis of esophagus   . Osteoarthrosis, unspecified whether generalized or localized, unspecified site   . Thyrotoxicosis without mention of goiter or other cause, without mention of thyrotoxic crisis or storm   . Other and unspecified hyperlipidemia   . Gout, unspecified   . Diverticulosis of colon (without mention of hemorrhage)   . C. difficile colitis   . VITAMIN B12 DEFICIENCY 08/30/2009  . GOITER, MULTINODULAR 04/02/2009  . HYPOTHYROIDISM, POST-RADIATION 08/13/2009  . ASYMPTOMATIC POSTMENOPAUSAL STATUS 10/11/2008  . Esophageal reflux 06/12/2008  . PONV (postoperative nausea and vomiting)   . Varicose veins   . Blood transfusion   .  UTI (urinary tract infection)   . Kidney stones     "several"  . Unspecified essential hypertension   . Angina   . Shortness of breath on  exertion     "sometimes"  . ANEMIA, IRON DEFICIENCY 05/08/2009  . History of lower GI bleeding   . H/O hiatal hernia   . Migraines   . Breast cancer ([place]) 09/29/11    invasive grade III ductal ca,assoc high grade dcis,ER/PR=neg  . History of radiation therapy 02/08/12-03/25/12    left breast,total 61gy  . Blood transfusion without reported diagnosis   . Hypomagnesemia   . Hypokalemia 05/11/2013  . Gastroparesis   . Type II or unspecified type diabetes mellitus without mention of complication, not stated as uncontrolled     no med in years diet controled    Past Surgical History  Procedure Laterality Date  . Vein ligation and stripping  1980's    Right leg  . Lithotripsy      "4 or 5 times"  . Bunionectomy  1970's    bilateral  . Colonoscopy  2012    multiple   . Esophagogastroduodenoscopy  2011    multiple   . Thyroid ultrasound  12/1994 and 12/1995  . Mastectomy w/ nodes partial  09/29/11    left  . Cholecystectomy  1990's  . Abdominal hysterectomy  1970's    with BSO  . Dilation and curettage of uterus    . Colon surgery      "several surgeries for short bowel syndrome"  . Abdominal adhesion surgery  1980's thru 1990's    "several"  . Kidney stone surgery  1990's    "tried to go up & get it but pushed it further up"  . Portacath placement  09/29/2011    Procedure: INSERTION PORT-A-CATH;  Surgeon: Adin Hector, MD;  Location: [facility];  Service: General;  Laterality: N/A;  . Breast biopsy  08/13/11    left breast lower inner quadrant  . Breast lumpectomy w/ needle localization  09/29/11    left  breast=lymph node,excision benign/ ER/PR=neg, her 2 Positive  . Flexible sigmoidoscopy  2011    multiple   . Port-a-cath removal Right 12/19/2013    Procedure: MINOR REMOVAL PORT-A-CATH;  Surgeon: Adin Hector, MD;  Location: [place];  Service: General;  Laterality: Right;     (Not in a hospital admission) Allergies  Allergen Reactions  . Aspirin Other  (See Comments)    REACTION: Gi Intolerance/ Burning in stomach  . Codeine Itching  . Trazodone And Nefazodone     "sick"  . Flagyl [Metronidazole] Rash  . Iodine Itching    Allergic to IVP dye  . Morphine And Related Itching    Social History  Substance Use Topics  . Smoking status: Former Smoker -- 1.00 packs/day for 10 years    Types: Cigarettes    Quit date: 09/22/1985  . Smokeless tobacco: Never Used  . Alcohol Use: No     Comment: 09/29/11 "used to drink socially years ago"    Family History  Problem Relation Age of Onset  . Esophageal cancer Son     deceased  . Diabetes Mother   . Heart disease Mother   . Colon cancer Paternal Uncle   . Kidney disease Sister   . Diabetes Father   . Hypertension Father   . Kidney disease Brother     x 3     Review of Systems  Constitutional: Negative.  HENT: Negative.   Eyes: Negative.   Respiratory: Negative.   Cardiovascular: Negative.   Gastrointestinal: Negative.   Genitourinary: Negative.   Musculoskeletal: Positive for joint pain.  Skin: Negative.   Neurological: Negative.   Endo/Heme/Allergies: Negative.   Psychiatric/Behavioral: Negative.     Objective:  Physical Exam  Constitutional: She is oriented to person, place, and time. She appears well-developed and well-nourished.  HENT:  Head: Normocephalic and atraumatic.  Eyes: EOM are normal. Pupils are equal, round, and reactive to light.  Neck: Normal range of motion. Neck supple.  Cardiovascular: Normal rate and regular rhythm.   Respiratory: Effort normal and breath sounds normal.  GI: Soft. Bowel sounds are normal.  Musculoskeletal:  Range of motion 0-125 degrees.  Valgus thrust, both sides.  Positive straight leg raise.  Negative log roll.  Medial greater than lateral joint line tenderness.    Neurological: She is alert and oriented to person, place, and time.  Skin: Skin is warm and dry.  Psychiatric: She has a normal mood and affect. Her behavior is  normal. Judgment and thought content normal.    Vital signs in last 24 hours: @VSRANGES @  Labs:   Estimated body mass index is 35.78 kg/(m^2) as calculated from the following:   Height as of 05/14/15: 5\' 5"  (1.651 m).   Weight as of 05/14/15: 97.523 kg (215 lb).   Imaging Review Plain radiographs demonstrate severe degenerative joint disease of the right knee(s). The overall alignment ismild valgus. The bone quality appears to be fair for age and reported activity level.  Assessment/Plan:  End stage arthritis, right knee   The patient history, physical examination, clinical judgment of the provider and imaging studies are consistent with end stage degenerative joint disease of the right knee(s) and total knee arthroplasty is deemed medically necessary. The treatment options including medical management, injection therapy arthroscopy and arthroplasty were discussed at length. The risks and benefits of total knee arthroplasty were presented and reviewed. The risks due to aseptic loosening, infection, stiffness, patella tracking problems, thromboembolic complications and other imponderables were discussed. The patient acknowledged the explanation, agreed to proceed with the plan and consent was signed. Patient is being admitted for inpatient treatment for surgery, pain control, PT, OT, prophylactic antibiotics, VTE prophylaxis, progressive ambulation and ADL's and discharge planning. The patient is planning to be discharged home with home health services

## 2015-06-05 NOTE — Anesthesia Preprocedure Evaluation (Addendum)
Anesthesia Evaluation  Patient identified by MRN, date of birth, ID band Patient awake    History of Anesthesia Complications (+) PONV  Airway Mallampati: II  TM Distance: >3 FB Neck ROM: Full    Dental  (+) Teeth Intact   Pulmonary shortness of breath, former smoker,    breath sounds clear to auscultation       Cardiovascular hypertension,  Rhythm:Regular Rate:Normal     Neuro/Psych  Neuromuscular disease    GI/Hepatic PUD, GERD  ,gastroparesis noted on chart   Endo/Other  diabetes, Well ControlledHypothyroidism Hyperthyroidism Morbid obesity  Renal/GU      Musculoskeletal  (+) Arthritis ,   Abdominal   Peds  Hematology  (+) anemia ,   Anesthesia Other Findings   Reproductive/Obstetrics                            Anesthesia Physical Anesthesia Plan  ASA: III  Anesthesia Plan: General   Post-op Pain Management:    Induction: Intravenous  Airway Management Planned: Oral ETT  Additional Equipment:   Intra-op Plan:   Post-operative Plan: Extubation in OR  Informed Consent: I have reviewed the patients History and Physical, chart, labs and discussed the procedure including the risks, benefits and alternatives for the proposed anesthesia with the patient or authorized representative who has indicated his/her understanding and acceptance.   Dental advisory given  Plan Discussed with: CRNA and Surgeon  Anesthesia Plan Comments:         Anesthesia Quick Evaluation

## 2015-06-05 NOTE — Progress Notes (Signed)
Blood sugar 66.  Patient is asymptomatic and states that she does not check her blood sugar at home and does not take anything for diabetes.  Dr. Orene Desanctis made aware and will recheck blood sugar at 0915.  Will continue to monitor patient.

## 2015-06-05 NOTE — Anesthesia Procedure Notes (Signed)
Procedure Name: Intubation Date/Time: 06/05/2015 9:55 AM Performed by: Collier Bullock Pre-anesthesia Checklist: Patient identified, Emergency Drugs available, Suction available and Patient being monitored Patient Re-evaluated:Patient Re-evaluated prior to inductionOxygen Delivery Method: Circle system utilized Preoxygenation: Pre-oxygenation with 100% oxygen Intubation Type: IV induction Ventilation: Oral airway inserted - appropriate to patient size Laryngoscope Size: Mac and 3 Grade View: Grade I Tube type: Oral Tube size: 7.0 mm Number of attempts: 1 Airway Equipment and Method: Stylet Placement Confirmation: ETT inserted through vocal cords under direct vision,  positive ETCO2 and breath sounds checked- equal and bilateral Secured at: 24 cm Tube secured with: Tape Dental Injury: Teeth and Oropharynx as per pre-operative assessment

## 2015-06-05 NOTE — Interval H&P Note (Signed)
History and Physical Interval Note:  06/05/2015 8:24 AM  [name]  has presented today for surgery, with the diagnosis of DJD RIGHT KNEE  The various methods of treatment have been discussed with the patient and family. After consideration of risks, benefits and other options for treatment, the patient has consented to  Procedure(s): RIGHT TOTAL KNEE ARTHROPLASTY (Right) as a surgical intervention .  The patient's history has been reviewed, patient examined, no change in status, stable for surgery.  I have reviewed the patient's chart and labs.  Questions were answered to the patient's satisfaction.     [name]

## 2015-06-05 NOTE — Progress Notes (Signed)
Orthopedic Tech Progress Note Patient Details:  JENNAH HILLIKER 02/01/1946 [id number]  CPM Right Knee CPM Right Knee: On Right Knee Flexion (Degrees): 90 Right Knee Extension (Degrees): 0  Ortho will provide ohf when one becomes available; RN notified Hildred Priest 06/05/2015, 12:13 PM

## 2015-06-05 NOTE — Discharge Summary (Addendum)
Patient ID: [name] MRN: [id number] DOB/AGE: 1945-12-06 70 y.o.  Admit date: 06/05/2015 Discharge date: 06/07/2015  Admission Diagnoses:  Active Problems:   DJD (degenerative joint disease) of knee   Discharge Diagnoses:  Same  Past Medical History  Diagnosis Date  . Obesity   . PUD (peptic ulcer disease)   . Leukopenia   . Short bowel syndrome   . Internal hemorrhoids without mention of complication   . Stricture and stenosis of esophagus   . Osteoarthrosis, unspecified whether generalized or localized, unspecified site   . Thyrotoxicosis without mention of goiter or other cause, without mention of thyrotoxic crisis or storm   . Other and unspecified hyperlipidemia   . Gout, unspecified   . Diverticulosis of colon (without mention of hemorrhage)   . C. difficile colitis   . VITAMIN B12 DEFICIENCY 08/30/2009  . GOITER, MULTINODULAR 04/02/2009  . HYPOTHYROIDISM, POST-RADIATION 08/13/2009  . ASYMPTOMATIC POSTMENOPAUSAL STATUS 10/11/2008  . Esophageal reflux 06/12/2008  . PONV (postoperative nausea and vomiting)   . Varicose veins   . Blood transfusion   . UTI (urinary tract infection)   . Kidney stones     "several"  . Unspecified essential hypertension   . Angina   . Shortness of breath on exertion     "sometimes"  . ANEMIA, IRON DEFICIENCY 05/08/2009  . History of lower GI bleeding   . H/O hiatal hernia   . Migraines   . Breast cancer ([place]) 09/29/11    invasive grade III ductal ca,assoc high grade dcis,ER/PR=neg  . History of radiation therapy 02/08/12-03/25/12    left breast,total 59gy  . Blood transfusion without reported diagnosis   . Hypomagnesemia   . Hypokalemia 05/11/2013  . Gastroparesis   . Type II or unspecified type diabetes mellitus without mention of complication, not stated as uncontrolled     no med in years diet controled    Surgeries: Procedure(s): RIGHT TOTAL KNEE ARTHROPLASTY on 06/05/2015   Consultants:    Discharged Condition:  Improved  Hospital Course: [name] is an 70 y.o. female who was admitted 06/05/2015 for operative treatment of primary localized osteoarthritis right knee. Patient has severe unremitting pain that affects sleep, daily activities, and work/hobbies. After pre-op clearance the patient was taken to the operating room on 06/05/2015 and underwent  Procedure(s): RIGHT TOTAL KNEE ARTHROPLASTY.  Patient with a pre-op Hb of 10.9 developed ABLA on pod#1 with a Hb of 10.7.  She is currently asymptomatic but we will continue to follow.  Patient was given perioperative antibiotics:      Anti-infectives    Start     Dose/Rate Route Frequency Ordered Stop   06/05/15 1545  ceFAZolin (ANCEF) IVPB 2 g/50 mL premix     2 g 100 mL/hr over 30 Minutes Intravenous Every 6 hours 06/05/15 1404 06/05/15 2210   06/05/15 0930  ceFAZolin (ANCEF) IVPB 2 g/50 mL premix     2 g 100 mL/hr over 30 Minutes Intravenous To ShortStay Surgical 06/04/15 0923 06/05/15 0954       Patient was given sequential compression devices, early ambulation, and chemoprophylaxis to prevent DVT.  Patient benefited maximally from hospital stay and there were no complications.    Recent vital signs:  Patient Vitals for the past 24 hrs:  BP Temp Temp src Pulse Resp SpO2  06/07/15 0551 133/77 mmHg 99.1 F (37.3 C) Oral (!) 102 18 98 %  06/06/15 2052 (!) 144/83 mmHg 99 F (37.2 C) Oral 94 18 97 %  06/06/15 1300 103/72 mmHg 98.5 F (36.9 C) - 81 18 100 %     Recent laboratory studies:   Recent Labs  06/06/15 0445  WBC 6.2  HGB 10.7*  HCT 32.1*  PLT PLATELET CLUMPS NOTED ON SMEAR, COUNT APPEARS DECREASED  NA 137  K 4.8  CL 104  CO2 23  BUN 12  CREATININE 0.95  GLUCOSE 125*  CALCIUM 8.6*     Discharge Medications:     Medication List    STOP taking these medications        oxyCODONE-acetaminophen 7.5-325 MG tablet  Commonly known as:  PERCOCET  Replaced by:  oxyCODONE-acetaminophen 5-325 MG tablet      TAKE  these medications        aMILoride 5 MG tablet  Commonly known as:  MIDAMOR  Take 5 mg by mouth 2 (two) times daily.     apixaban 2.5 MG Tabs tablet  Commonly known as:  ELIQUIS  Take 1 tab po q12 hours x 21 days following surgery to prevent blood clots.     calcium carbonate 500 MG chewable tablet  Commonly known as:  TUMS - dosed in mg elemental calcium  Chew 1 tablet by mouth as needed for indigestion or heartburn.     calcium gluconate 650 MG tablet  Take 1 tablet (650 mg total) by mouth daily.     Calcium-Vitamin D 600-200 MG-UNIT Caps  Take 1 capsule by mouth daily.     cyanocobalamin 1000 MCG/ML injection  Commonly known as:  (VITAMIN B-12)  Inject 1,000 mcg into the muscle every 30 (thirty) days.     diazepam 2 MG tablet  Commonly known as:  VALIUM  Take 1 tablet (2 mg total) by mouth every 8 (eight) hours as needed for muscle spasms.     ferrous sulfate 325 (65 FE) MG tablet  Take 325 mg by mouth daily with breakfast.     fluorometholone 0.1 % ophthalmic suspension  Commonly known as:  FML  Place 1 drop into both eyes 2 (two) times daily as needed (Dry Eyes).     gabapentin 300 MG capsule  Commonly known as:  NEURONTIN  Take 1 capsule (300 mg total) by mouth 3 (three) times daily.     levothyroxine 150 MCG tablet  Commonly known as:  SYNTHROID, LEVOTHROID  TAKE 1 TABLET BY MOUTH DAILY BEFORE BREAKFAST.     loperamide 2 MG capsule  Commonly known as:  IMODIUM  Take 2 capsules (4 mg total) by mouth every 4 (four) hours as needed for diarrhea or loose stools.     lovastatin 20 MG tablet  Commonly known as:  MEVACOR  TAKE 1 TABLET (20 MG TOTAL) BY MOUTH DAILY AT 6 PM.     magnesium oxide 400 (241.3 Mg) MG tablet  Commonly known as:  MAG-OX  Take 400 mg by mouth 3 (three) times daily.     ondansetron 4 MG disintegrating tablet  Commonly known as:  ZOFRAN ODT  Take 1 tablet (4 mg total) by mouth every 8 (eight) hours as needed for nausea.      oxyCODONE-acetaminophen 5-325 MG tablet  Commonly known as:  PERCOCET  Take 1-2 tablets by mouth every 4 (four) hours as needed for severe pain.     pantoprazole 40 MG tablet  Commonly known as:  PROTONIX  TAKE 1 TABLET 30 MINUTES BEFORE BREAKFAST AND SUPPER     pramoxine-hydrocortisone cream  Commonly known as:  ANALPRAM HC  Apply topically  2 (two) times daily.     saccharomyces boulardii 250 MG capsule  Commonly known as:  FLORASTOR  Take 1 capsule (250 mg total) by mouth 2 (two) times daily.     SLOW-MAG 64 MG Tbec SR tablet  Generic drug:  magnesium chloride  Take 1 tablet by mouth 3 (three) times daily.     thiamine 100 MG tablet  Take 100 mg by mouth daily.     Vitamin D (Ergocalciferol) 50000 units Caps capsule  Commonly known as:  DRISDOL  TAKE 1 TABLET TWICE A WEEK        Diagnostic Studies: Dg Knee Right Port  06/05/2015  CLINICAL DATA:  Status post right knee replacement EXAM: PORTABLE RIGHT KNEE - 1-2 VIEW COMPARISON:  06/05/2011 FINDINGS: Two views of the right knee submitted. There is right knee prosthesis with anatomic alignment. Postsurgical changes are noted with a small amount of periarticular soft tissue air. IMPRESSION: Right knee prosthesis with anatomic alignment. Electronically Signed   By: Lahoma Crocker M.D.   On: 06/05/2015 12:15    Disposition: 01-Home or Self Care    Follow-up Information    Follow up with Ninetta Lights, MD. Schedule an appointment as soon as possible for a visit in 2 weeks.   Specialty:  Orthopedic Surgery   Contact information:   [place] [address] [phone]       Follow up with [facility].   Why:  They will contact you to schedule home therapy visits.   Contact information:   [address] [place] [address] [phone]        Signed: Fannie Knee 06/07/2015, 6:05 AM

## 2015-06-05 NOTE — Transfer of Care (Signed)
Immediate Anesthesia Transfer of Care Note  Patient: [name]  Procedure(s) Performed: Procedure(s): RIGHT TOTAL KNEE ARTHROPLASTY (Right)  Patient Location: PACU  Anesthesia Type:General  Level of Consciousness: awake, oriented and patient cooperative  Airway & Oxygen Therapy: Patient Spontanous Breathing and Patient connected to face mask oxygen  Post-op Assessment: Report given to RN, Post -op Vital signs reviewed and stable and Patient moving all extremities X 4  Post vital signs: Reviewed and stable  Last Vitals:  Filed Vitals:   06/05/15 0818  BP: 110/78  Pulse: 72  Temp: 36.2 C  Resp: 16    Complications: No apparent anesthesia complications

## 2015-06-05 NOTE — Progress Notes (Signed)
Report given to  Drucie Opitz as caregiver

## 2015-06-05 NOTE — Progress Notes (Signed)
Utilization review completed.  

## 2015-06-06 ENCOUNTER — Encounter (HOSPITAL_COMMUNITY): Payer: Self-pay | Admitting: Orthopedic Surgery

## 2015-06-06 LAB — BASIC METABOLIC PANEL
ANION GAP: 10 (ref 5–15)
BUN: 12 mg/dL (ref 6–20)
CO2: 23 mmol/L (ref 22–32)
Calcium: 8.6 mg/dL — ABNORMAL LOW (ref 8.9–10.3)
Chloride: 104 mmol/L (ref 101–111)
Creatinine, Ser: 0.95 mg/dL (ref 0.44–1.00)
GFR calc Af Amer: >60 mL/min (ref >60)
GFR, EST NON AFRICAN AMERICAN: 59 mL/min — AB (ref >60)
Glucose, Bld: 125 mg/dL — ABNORMAL HIGH (ref 65–99)
POTASSIUM: 4.8 mmol/L (ref 3.5–5.1)
SODIUM: 137 mmol/L (ref 135–145)

## 2015-06-06 LAB — CBC
HCT: 32.1 % — ABNORMAL LOW (ref 36.0–46.0)
HEMOGLOBIN: 10.7 g/dL — AB (ref 12.0–15.0)
MCH: 30.7 pg (ref 26.0–34.0)
MCHC: 33.3 g/dL (ref 30.0–36.0)
MCV: 92.2 fL (ref 78.0–100.0)
PLATELETS: DECREASED 10*3/uL (ref 150–400)
RBC: 3.48 MIL/uL — AB (ref 3.87–5.11)
RDW: 13.6 % (ref 11.5–15.5)
WBC: 6.2 10*3/uL (ref 4.0–10.5)

## 2015-06-06 MED ORDER — OXYCODONE HCL 5 MG PO TABS
5.0000 mg | ORAL_TABLET | ORAL | Status: DC | PRN
  Administered 2015-06-06 – 2015-06-07 (×8): 10 mg via ORAL
  Filled 2015-06-06 (×7): qty 2

## 2015-06-06 MED ORDER — OXYCODONE-ACETAMINOPHEN 5-325 MG PO TABS: 1.0000 | ORAL_TABLET | ORAL | Status: DC | PRN

## 2015-06-06 NOTE — Progress Notes (Signed)
Subjective: 1 Day Post-Op Procedure(s) (LRB): RIGHT TOTAL [name] ARTHROPLASTY (Right) Patient reports pain as severe.  No nausea/vomiting, chest pain/sob, lightheadedness/dizziness.  Objective: Vital signs in last 24 hours: Temp:  [97.1 F (36.2 C)-98.2 F (36.8 C)] 98.1 F (36.7 C) (01/05 0518) Pulse Rate:  [63-76] 66 (01/05 0518) Resp:  [11-18] 18 (01/05 0518) BP: (110-136)/(73-93) 126/73 mmHg (01/05 0518) SpO2:  [100 %] 100 % (01/05 0518) Weight:  [99.247 kg (218 lb 12.8 oz)] 99.247 kg (218 lb 12.8 oz) (01/04 0818)  Intake/Output from previous day: 01/04 0701 - 01/05 0700 In: 2700 [I.V.:2700] Out: 1460 [Urine:1450; Blood:10] Intake/Output this shift: Total I/O In: -  Out: 1050 [Urine:1050]  No results for input(s): HGB in the last 72 hours. No results for input(s): WBC, RBC, HCT, PLT in the last 72 hours.  Recent Labs  06/06/15 0445  NA 137  K 4.8  CL 104  CO2 23  BUN 12  CREATININE 0.95  GLUCOSE 125*  CALCIUM 8.6*   No results for input(s): LABPT, INR in the last 72 hours.  Neurologically intact Neurovascular intact Sensation intact distally Intact pulses distally Dorsiflexion/Plantar flexion intact Compartment soft  Assessment/Plan: 1 Day Post-Op Procedure(s) (LRB): RIGHT TOTAL [name] ARTHROPLASTY (Right) Advance diet Up with therapy Plan for discharge tomorrow home with hhpt Will increase pain meds WBAT RLE Dry dressing change prn  [name] 06/06/2015, 6:25 AM

## 2015-06-06 NOTE — Progress Notes (Signed)
Dressing changed per Dr Lorin Mercy no drainage noted dressing dry intact and clean

## 2015-06-06 NOTE — Care Management Note (Addendum)
Case Management Note  Patient Details  Name: [name] MRN: [id number] Date of Birth: 1946/02/07  Subjective/Objective:             S/p right total knee arthroplasty       Action/Plan: Set up with [facility] for HHPT by MD office. Based on therapy evaluations contacted [name] with [facility] and added [facility] and aide visits. Spoke with patient, no change in discharge plan. Patient stated that she has a rolling walker and 3N1 at home and her husband will be available to assist after discharge. [facility] [employer] [place] will delivered CPM to home. Will continue to follow for d/c needs.  PT recommending SNF based on pm session, made referral to [place] but will continue to follow.  06/07/15 PT continuing to recommend SNF. Spoke with patient and her husband about PT recommendation explained safety concern and difference between inpatient rehab at SNF vs home PT . Patient's husband stated that he will not be able to assist her due to history of 2 hip replacements. Patient stated that she does not want to go to SNF but is agreeable to speaking with CSW and considering SNF. Updated CSW.    Expected Discharge Date:                  Expected Discharge Plan:  [place]  In-House Referral:  NA  Discharge planning Services  CM Consult  Post Acute Care Choice:  Durable Medical Equipment, Home Health Choice offered to:  Patient  DME Arranged:  CPM DME Agency:  [employer]  HH Arranged:  PT, OT, Nurse's Aide [place] Agency:  [place]  Status of Service:  Completed, signed off  Medicare Important Message Given:    Date Medicare IM Given:    Medicare IM give by:    Date Additional Medicare IM Given:    Additional Medicare Important Message give by:     If discussed at [place] of Stay Meetings, dates discussed:    Additional Comments:  [name], RN 06/06/2015, 1:47 PM

## 2015-06-06 NOTE — Progress Notes (Signed)
Physical Therapy Treatment Patient Details Name: [name] MRN: [id number] DOB: [birth year] Today's Date: 06/06/2015    History of Present Illness Patient is a 70 y/o female admitted with OA R knee now s/p TKA.  PMH positive for OA, obesity, DM,  diverticulitis, c-diff, breast CA with L mastectomy, gout, UTI, GI bleed.    PT Comments    Patient is making slow progress with PT. Mod A for bed mobility and transfers this session. Pt limited by pain with ability to ambulate 16 ft this session. Continue to progress as tolerated. Patient needs to practice stairs next session.     Follow Up Recommendations  Home health PT;Supervision for mobility/OOB     Equipment Recommendations  None recommended by PT    Recommendations for Other Services       Precautions / Restrictions Precautions Precautions: Fall;Knee Restrictions Weight Bearing Restrictions: Yes RLE Weight Bearing: Weight bearing as tolerated    Mobility  Bed Mobility Overal bed mobility: Needs Assistance Bed Mobility: Supine to Sit     Supine to sit: Mod assist     General bed mobility comments: assistance to bring R LE to EOB and mod A to elevate trunk into sitting; HOB flat with use of bed rails  Transfers Overall transfer level: Needs assistance Equipment used: Rolling walker (2 wheeled) Transfers: Sit to/from Stand Sit to Stand: Mod assist         General transfer comment: mod A to power up into standing and increased time to achieve upright posture; vc for hand placement and technique.  Ambulation/Gait Ambulation/Gait assistance: Min guard Ambulation Distance (Feet): 16 Feet Assistive device: Rolling walker (2 wheeled) Gait Pattern/deviations: Step-to pattern;Decreased step length - left;Decreased stance time - right;Antalgic;Trunk flexed     General Gait Details: vc for maintaining upright posture, sequencing, and position of RW; pt became fatigued and began resting bilat forearms on RW; vc  given to maintain safe hand placement on RW  ;    Stairs            Wheelchair Mobility    Modified Rankin (Stroke Patients Only)       Balance Overall balance assessment: Needs assistance Sitting-balance support: Feet supported Sitting balance-Leahy Scale: Good     Standing balance support: Bilateral upper extremity supported Standing balance-Leahy Scale: Poor                      Cognition Arousal/Alertness: Awake/alert Behavior During Therapy: Flat affect Overall Cognitive Status: Within Functional Limits for tasks assessed                      Exercises Total Joint Exercises Quad Sets: AROM;Right;10 reps;Supine Heel Slides: AAROM;Right;10 reps;Supine Goniometric ROM: 0-25    General Comments        Pertinent Vitals/Pain Pain Assessment: 0-10 Pain Score: 8  Pain Location: R knee Pain Descriptors / Indicators: Sore Pain Intervention(s): Limited activity within patient's tolerance;Monitored during session;Premedicated before session;Repositioned    Home Living                      Prior Function            PT Goals (current goals can now be found in the care plan section) Acute Rehab PT Goals Patient Stated Goal: decrease pain Progress towards PT goals: Progressing toward goals    Frequency  7X/week    PT Plan Current plan remains appropriate    Co-evaluation  End of Session Equipment Utilized During Treatment: Gait belt Activity Tolerance: Patient limited by pain Patient left: in bed;with call bell/phone within reach;with family/visitor present     Time: [id number] PT Time Calculation (min) (ACUTE ONLY): 31 min  Charges:  $Gait Training: 8-22 mins $Therapeutic Exercise: 8-22 mins                    G Codes:      Salina April, PTA Pager: [phone]   06/06/2015, 1:39 PM

## 2015-06-06 NOTE — Progress Notes (Signed)
Occupational Therapy Evaluation Patient Details Name: [name] MRN: [id number] DOB: 28-Dec-1945 Today's Date: 06/06/2015    History of Present Illness Patient is a 70 y/o female admitted with OA R knee now s/p TKA.  PMH positive for OA, obesity, DM,  diverticulitis, c-diff, breast CA with L mastectomy, gout, UTI, GI bleed.   Clinical Impression   PTA, pt was independent with ADLs and functional mobility. Pt currently presents with acute R knee pain and generalized weakness and required mod assist for all functional transfers and ambulation. Pt ambulated 15' and was unable to make it to the bathroom to attempt toilet transfer - simulated stand-pivot transfer to chair instead. Pt also unable to reach LB for dressing and bathing and pt reports that her husband will not be able to provide that level of physical assist due to his own medical issues. Pt plans to d/c home with husband who can only provide supervision level assist - pt will need to be at supervision level in order to safely d/c home. Currently recommending SNF for post-acute rehab stay to increase pt's independence and safety with ADLs and mobility.    Follow Up Recommendations  SNF;Supervision/Assistance - 24 hour    Equipment Recommendations  None recommended by OT    Recommendations for Other Services       Precautions / Restrictions Precautions Precautions: Fall;Knee Restrictions Weight Bearing Restrictions: Yes RLE Weight Bearing: Weight bearing as tolerated      Mobility Bed Mobility Overal bed mobility: Needs Assistance Bed Mobility: Supine to Sit;Sit to Supine     Supine to sit: Mod assist;HOB elevated Sit to supine: Mod assist   General bed mobility comments: Assist to progress RLE on/off bed. Mod assist to support trunk to come to sitting position. HOB elevated for sit-supine and use of bedrails.  Transfers Overall transfer level: Needs assistance Equipment used: Rolling walker (2  wheeled) Transfers: Sit to/from [facility] Sit to Stand: Mod assist Stand pivot transfers: Mod assist       General transfer comment: Mod assist for boost to stand and to maintain balance upon standing. Verbal cues for safe hand placement and RW use.     Balance Overall balance assessment: Needs assistance Sitting-balance support: No upper extremity supported;Feet supported Sitting balance-Leahy Scale: Fair     Standing balance support: Bilateral upper extremity supported;During functional activity Standing balance-Leahy Scale: Poor Standing balance comment: flexed posture and heavy UE support on RW                            ADL Overall ADL's : Needs assistance/impaired                     Lower Body Dressing: Moderate assistance;Cueing for safety;Sitting/lateral leans Lower Body Dressing Details (indicate cue type and reason): to don/doff socks and shoes Toilet Transfer: Moderate assistance;Cueing for safety;Stand-pivot;Ambulation;BSC;RW Toilet Transfer Details (indicate cue type and reason): Simulated stand-pivot - pt unable to ambulate to bathroom due to pain and fatigue. Cues for hand placement Toileting- Clothing Manipulation and Hygiene: Moderate assistance;Cueing for safety;Sit to/from stand       Functional mobility during ADLs: Moderate assistance;Rolling walker;Cueing for safety General ADL Comments: Mod assist for all mobility during ADLs. Verbal cues throughout for safe hand placement and body positioning before sitting. Pt unable to reach LB for dressing and bathing tasks and does not believe her husband will be able to assist. Pt unable to ambulate  to bathroom to attempt toilet transfer - simulated stand-pivot from bed to chair.     Vision Vision Assessment?: No apparent visual deficits   Perception     Praxis      Pertinent Vitals/Pain Pain Assessment: 0-10 Pain Score: 10-Worst pain ever Pain Location: R knee Pain  Descriptors / Indicators: Aching;Sore Pain Intervention(s): Limited activity within patient's tolerance;Monitored during session;Repositioned;RN gave pain meds during session;Ice applied     Hand Dominance Right   Extremity/Trunk Assessment Upper Extremity Assessment Upper Extremity Assessment: Generalized weakness   Lower Extremity Assessment Lower Extremity Assessment: RLE deficits/detail   Cervical / Trunk Assessment Cervical / Trunk Assessment: Kyphotic   Communication Communication Communication: No difficulties   Cognition Arousal/Alertness: Awake/alert Behavior During Therapy: Flat affect Overall Cognitive Status: Within Functional Limits for tasks assessed                     General Comments       Exercises       Shoulder Instructions      Home Living Family/patient expects to be discharged to:: Private residence Living Arrangements: Spouse/significant other Available Help at Discharge: Family;Available 24 hours/day Type of Home: House Home Access: Stairs to enter [employer] of Steps: 2 Entrance Stairs-Rails: Right;Left Home Layout: One level     Bathroom Shower/Tub: Tub/shower unit;Curtain Shower/tub characteristics: Architectural technologist: Handicapped height     Home Equipment: Environmental consultant - 2 wheels;Bedside commode;Tub bench;Hand held shower head   Additional Comments: Pt's husband is 88 and has had 2 hip replacements and can only provide supervision level assist.      Prior Functioning/Environment Level of Independence: Independent             OT Diagnosis: Generalized weakness;Acute pain   OT Problem List: Decreased range of motion;Decreased strength;Decreased activity tolerance;Impaired balance (sitting and/or standing);Decreased coordination;Decreased safety awareness;Decreased knowledge of use of DME or AE;Decreased knowledge of precautions;Obesity;Pain   OT Treatment/Interventions: Self-care/ADL training;Therapeutic  exercise;Energy conservation;DME and/or AE instruction;Therapeutic activities;Patient/family education;Balance training    OT Goals(Current goals can be found in the care plan section) Acute Rehab OT Goals Patient Stated Goal: decrease pain OT Goal Formulation: With patient Time For Goal Achievement: 06/20/15 Potential to Achieve Goals: Good ADL Goals Pt Will Perform Grooming: with min guard assist;standing Pt Will Perform Lower Body Bathing: with min guard assist;sitting/lateral leans;sit to/from stand Pt Will Perform Lower Body Dressing: with min guard assist;sitting/lateral leans;sit to/from stand Pt Will Transfer to Toilet: with min guard assist;ambulating;bedside commode Pt Will Perform Toileting - Clothing Manipulation and hygiene: with min guard assist;sitting/lateral leans;sit to/from stand Pt Will Perform Tub/Shower Transfer: Tub transfer;with min guard assist;ambulating;tub bench;rolling walker  OT Frequency: Min 2X/week   Barriers to D/C: Decreased caregiver support  Pt reports that her husband can provide limited support - supervision level at most       Co-evaluation              End of Session Equipment Utilized During Treatment: Gait belt;Rolling walker CPM Right Knee CPM Right Knee: Off Additional Comments: Zero degree bone foam applied Nurse Communication: Mobility status;Precautions  Activity Tolerance: Patient limited by pain Patient left: in bed;with call bell/phone within reach;with family/visitor present   Time: [id number] OT Time Calculation (min): 29 min Charges:  OT General Charges $OT Visit: 1 Procedure OT Evaluation $OT Eval Moderate Complexity: 1 Procedure OT Treatments $Self Care/Home Management : 8-22 mins G-Codes:    Redmond Baseman, OTR/L Pager: [phone] 06/06/2015, 9:38 AM

## 2015-06-06 NOTE — Progress Notes (Signed)
Physical Therapy Treatment Patient Details Name: [name] MRN: [id number] DOB: 03/20/1946 Today's Date: 06/06/2015    History of Present Illness Patient is a 70 y/o female admitted with OA R knee now s/p TKA.  PMH positive for OA, obesity, DM,  diverticulitis, c-diff, breast CA with L mastectomy, gout, UTI, GI bleed.    PT Comments    Patient with increased difficulty performing transfers and bed mobility this session requiring max A/max A +2 for sit to stand from commode. Pt unable to attempt stairs at this time and has two steps to enter home. Pt reported that husband cannot provide any physical assistance. Due to pt's current mobility level and limited assistance at home recommending SNF for further skilled PT services to maximize independence and safety with functional mobility.  Follow Up Recommendations  SNF     Equipment Recommendations  None recommended by PT    Recommendations for Other Services       Precautions / Restrictions Precautions Precautions: Fall;Knee Restrictions Weight Bearing Restrictions: Yes RLE Weight Bearing: Weight bearing as tolerated    Mobility  Bed Mobility Overal bed mobility: Needs Assistance Bed Mobility: Supine to Sit;Sit to Supine     Supine to sit: Mod assist Sit to supine: Max assist   General bed mobility comments: mod A for bringing bilat LE to EOB and elevating trunk with pt using bedrail and vc for technique; max A for elevating bilat LE into bed with vc for sequencing  Transfers Overall transfer level: Needs assistance Equipment used: Rolling walker (2 wheeled) Transfers: Sit to/from Stand Sit to Stand: Mod assist;Max assist;+2 physical assistance         General transfer comment: mod A with 2 trials before achieving stand from EOB and max A +2 for physical assist from commode with use of grab bar and RW; vc for hand placement for each trial  Ambulation/Gait Ambulation/Gait assistance: Min guard;Min  assist Ambulation Distance (Feet): 25 Feet (20 ft, 28ft) Assistive device: Rolling walker (2 wheeled) Gait Pattern/deviations: Step-to pattern;Trunk flexed;Decreased step length - left;Decreased stance time - right     General Gait Details: vc for sequencing and maintaining upright posture with pt leaning on forearms when fatigued; pt with tendency to scoot L foot forward with decreased ability to WS onto R LE   Stairs            Wheelchair Mobility    Modified Rankin (Stroke Patients Only)       Balance Overall balance assessment: Needs assistance Sitting-balance support: Feet supported Sitting balance-Leahy Scale: Good     Standing balance support: Bilateral upper extremity supported Standing balance-Leahy Scale: Poor                      Cognition Arousal/Alertness: Awake/alert Behavior During Therapy: Flat affect Overall Cognitive Status: Within Functional Limits for tasks assessed                      Exercises Total Joint Exercises Quad Sets: AROM;Right;10 reps;Supine Heel Slides: AAROM;Right;10 reps;Supine Goniometric ROM: 0-25    General Comments General comments (skin integrity, edema, etc.): pt unable to attempt stairs at this time but does have two to enter home and husband cannot provide physical assistance per pt      Pertinent Vitals/Pain Pain Assessment: Faces Pain Score: 8  Faces Pain Scale: Hurts even more Pain Location: R knee Pain Descriptors / Indicators: Sore Pain Intervention(s): Limited activity within patient's tolerance;Monitored during session;Patient  requesting pain meds-RN notified;Repositioned    Home Living                      Prior Function            PT Goals (current goals can now be found in the care plan section) Acute Rehab PT Goals Patient Stated Goal: decrease pain Progress towards PT goals: Not progressing toward goals - comment    Frequency  7X/week    PT Plan Current plan remains  appropriate    Co-evaluation             End of Session Equipment Utilized During Treatment: Gait belt Activity Tolerance: Patient limited by pain Patient left: in bed;with call bell/phone within reach;with nursing/sitter in room (RN giving pain meds)     Time: [id number] PT Time Calculation (min) (ACUTE ONLY): 35 min  Charges:  $Gait Training: 8-22 mins  $Therapeutic Activity: 8-22 mins                    G Codes:      Salina April, PTA Pager: [phone]   06/06/2015, 4:35 PM

## 2015-06-07 LAB — CBC
HCT: 28.3 % — ABNORMAL LOW (ref 36.0–46.0)
HEMOGLOBIN: 9.1 g/dL — AB (ref 12.0–15.0)
MCH: 30 pg (ref 26.0–34.0)
MCHC: 32.2 g/dL (ref 30.0–36.0)
MCV: 93.4 fL (ref 78.0–100.0)
Platelets: 193 10*3/uL (ref 150–400)
RBC: 3.03 MIL/uL — AB (ref 3.87–5.11)
RDW: 13.5 % (ref 11.5–15.5)
WBC: 5.3 10*3/uL (ref 4.0–10.5)

## 2015-06-07 LAB — BASIC METABOLIC PANEL
Anion gap: 8 (ref 5–15)
BUN: 11 mg/dL (ref 6–20)
CHLORIDE: 102 mmol/L (ref 101–111)
CO2: 26 mmol/L (ref 22–32)
CREATININE: 1.02 mg/dL — AB (ref 0.44–1.00)
Calcium: 8.5 mg/dL — ABNORMAL LOW (ref 8.9–10.3)
GFR calc Af Amer: >60 mL/min (ref >60)
GFR calc non Af Amer: 54 mL/min — ABNORMAL LOW (ref >60)
GLUCOSE: 118 mg/dL — AB (ref 65–99)
POTASSIUM: 4 mmol/L (ref 3.5–5.1)
Sodium: 136 mmol/L (ref 135–145)

## 2015-06-07 MED ORDER — DEXAMETHASONE 4 MG PO TABS
10.0000 mg | ORAL_TABLET | Freq: Three times a day (TID) | ORAL | Status: DC
  Administered 2015-06-07 – 2015-06-08 (×3): 10 mg via ORAL
  Filled 2015-06-07 (×3): qty 3

## 2015-06-07 MED ORDER — OXYCODONE HCL 5 MG PO TABS
5.0000 mg | ORAL_TABLET | ORAL | Status: DC | PRN
  Administered 2015-06-07 – 2015-06-08 (×6): 15 mg via ORAL
  Filled 2015-06-07 (×7): qty 3

## 2015-06-07 NOTE — Discharge Instructions (Signed)
INSTRUCTIONS AFTER JOINT REPLACEMENT   o Remove items at home which could result in a fall. This includes throw rugs or furniture in walking pathways o ICE to the affected joint every three hours while awake for 30 minutes at a time, for at least the first 3-5 days, and then as needed for pain and swelling.  Continue to use ice for pain and swelling. You may notice swelling that will progress down to the foot and ankle.  This is normal after surgery.  Elevate your leg when you are not up walking on it.   o Continue to use the breathing machine you got in the hospital (incentive spirometer) which will help keep your temperature down.  It is common for your temperature to cycle up and down following surgery, especially at night when you are not up moving around and exerting yourself.  The breathing machine keeps your lungs expanded and your temperature down.   DIET:  As you were doing prior to hospitalization, we recommend a well-balanced diet.  DRESSING / WOUND CARE / SHOWERING  You may change your dressing 3-5 days after surgery.  Then change the dressing every day with sterile gauze.  Please use good hand washing techniques before changing the dressing.  Do not use any lotions or creams on the incision until instructed by your surgeon. and You may shower 3 days after surgery, but keep the wounds dry during showering.  You may use an occlusive plastic wrap (Press'n Seal for example), NO SOAKING/SUBMERGING IN THE BATHTUB.  If the bandage gets wet, change with a clean dry gauze.  If the incision gets wet, pat the wound dry with a clean towel.  ACTIVITY  o Increase activity slowly as tolerated, but follow the weight bearing instructions below.   o No driving for 6 weeks or until further direction given by your physician.  You cannot drive while taking narcotics.  o No lifting or carrying greater than 10 lbs. until further directed by your surgeon. o Avoid periods of inactivity such as sitting longer  than an hour when not asleep. This helps prevent blood clots.  o You may return to work once you are authorized by your doctor.     WEIGHT BEARING   Weight bearing as tolerated with assist device (walker, cane, etc) as directed, use it as long as suggested by your surgeon or therapist, typically at least 4-6 weeks.   EXERCISES  Results after joint replacement surgery are often greatly improved when you follow the exercise, range of motion and muscle strengthening exercises prescribed by your doctor. Safety measures are also important to protect the joint from further injury. Any time any of these exercises cause you to have increased pain or swelling, decrease what you are doing until you are comfortable again and then slowly increase them. If you have problems or questions, call your caregiver or physical therapist for advice.   Rehabilitation is important following a joint replacement. After just a few days of immobilization, the muscles of the leg can become weakened and shrink (atrophy).  These exercises are designed to build up the tone and strength of the thigh and leg muscles and to improve motion. Often times heat used for twenty to thirty minutes before working out will loosen up your tissues and help with improving the range of motion but do not use heat for the first two weeks following surgery (sometimes heat can increase post-operative swelling).   These exercises can be done on a training (  exercise) mat, on the floor, on a table or on a bed. Use whatever works the best and is most comfortable for you.    Use music or television while you are exercising so that the exercises are a pleasant break in your day. This will make your life better with the exercises acting as a break in your routine that you can look forward to.   Perform all exercises about fifteen times, three times per day or as directed.  You should exercise both the operative leg and the other leg as well.  Exercises  include:    Quad Sets - Tighten up the muscle on the front of the thigh (Quad) and hold for 5-10 seconds.    Straight Leg Raises - With your knee straight (if you were given a brace, keep it on), lift the leg to 60 degrees, hold for 3 seconds, and slowly lower the leg.  Perform this exercise against resistance later as your leg gets stronger.   Leg Slides: Lying on your back, slowly slide your foot toward your buttocks, bending your knee up off the floor (only go as far as is comfortable). Then slowly slide your foot back down until your leg is flat on the floor again.   Angel Wings: Lying on your back spread your legs to the side as far apart as you can without causing discomfort.   Hamstring Strength:  Lying on your back, push your heel against the floor with your leg straight by tightening up the muscles of your buttocks.  Repeat, but this time bend your knee to a comfortable angle, and push your heel against the floor.  You may put a pillow under the heel to make it more comfortable if necessary.   A rehabilitation program following joint replacement surgery can speed recovery and prevent re-injury in the future due to weakened muscles. Contact your doctor or a physical therapist for more information on knee rehabilitation.    CONSTIPATION  Constipation is defined medically as fewer than three stools per week and severe constipation as less than one stool per week.  Even if you have a regular bowel pattern at home, your normal regimen is likely to be disrupted due to multiple reasons following surgery.  Combination of anesthesia, postoperative narcotics, change in appetite and fluid intake all can affect your bowels.   YOU MUST use at least one of the following options; they are listed in order of increasing strength to get the job done.  They are all available over the counter, and you may need to use some, POSSIBLY even all of these options:    Drink plenty of fluids (prune juice may be  helpful) and high fiber foods Colace 100 mg by mouth twice a day  Senokot for constipation as directed and as needed Dulcolax (bisacodyl), take with full glass of water  Miralax (polyethylene glycol) once or twice a day as needed.  If you have tried all these things and are unable to have a bowel movement in the first 3-4 days after surgery call either your surgeon or your primary doctor.    If you experience loose stools or diarrhea, hold the medications until you stool forms back up.  If your symptoms do not get better within 1 week or if they get worse, check with your doctor.  If you experience "the worst abdominal pain ever" or develop nausea or vomiting, please contact the office immediately for further recommendations for treatment.   ITCHING:  If  you experience itching with your medications, try taking only a single pain pill, or even half a pain pill at a time.  You can also use Benadryl over the counter for itching or also to help with sleep.   TED HOSE STOCKINGS:  Use stockings on both legs until for at least 2 weeks or as directed by physician office. They may be removed at night for sleeping.  MEDICATIONS:  See your medication summary on the After Visit Summary that nursing will review with you.  You may have some home medications which will be placed on hold until you complete the course of blood thinner medication.  It is important for you to complete the blood thinner medication as prescribed.  PRECAUTIONS:  If you experience chest pain or shortness of breath - call 911 immediately for transfer to the hospital emergency department.   If you develop a fever greater that 101 F, purulent drainage from wound, increased redness or drainage from wound, foul odor from the wound/dressing, or calf pain - CONTACT YOUR SURGEON.                                                   FOLLOW-UP APPOINTMENTS:  If you do not already have a post-op appointment, please call the office for an  appointment to be seen by your surgeon.  Guidelines for how soon to be seen are listed in your After Visit Summary, but are typically between 1-4 weeks after surgery.  OTHER INSTRUCTIONS:   Knee Replacement:  Do not place pillow under knee, focus on keeping the knee straight while resting. CPM instructions: 0-90 degrees, 2 hours in the morning, 2 hours in the afternoon, and 2 hours in the evening. Place foam block, curve side up under heel at all times except when in CPM or when walking.  DO NOT modify, tear, cut, or change the foam block in any way.  MAKE SURE YOU:   Understand these instructions.   Get help right away if you are not doing well or get worse.    Thank you for letting us be a part of your medical care team.  It is a privilege we respect greatly.  We hope these instructions will help you stay on track for a fast and full recovery!   Information on my medicine - ELIQUIS (apixaban)  This medication education was reviewed with me or my healthcare representative as part of my discharge preparation.    Why was Eliquis prescribed for you? Eliquis was prescribed for you to reduce the risk of blood clots forming after orthopedic surgery.    What do You need to know about Eliquis? Take your Eliquis TWICE DAILY - one tablet in the morning and one tablet in the evening with or without food.  It would be best to take the dose about the same time each day.  If you have difficulty swallowing the tablet whole please discuss with your pharmacist how to take the medication safely.  Take Eliquis exactly as prescribed by your doctor and DO NOT stop taking Eliquis without talking to the doctor who prescribed the medication.  Stopping without other medication to take the place of Eliquis may increase your risk of developing a clot.  After discharge, you should have regular check-up appointments with your healthcare provider that is prescribing your Eliquis.  What  do you do if you  miss a dose? If a dose of ELIQUIS is not taken at the scheduled time, take it as soon as possible on the same day and twice-daily administration should be resumed.  The dose should not be doubled to make up for a missed dose.  Do not take more than one tablet of ELIQUIS at the same time.  Important Safety Information A possible side effect of Eliquis is bleeding. You should call your healthcare provider right away if you experience any of the following: ? Bleeding from an injury or your nose that does not stop. ? Unusual colored urine (red or dark brown) or unusual colored stools (red or black). ? Unusual bruising for unknown reasons. ? A serious fall or if you hit your head (even if there is no bleeding).  Some medicines may interact with Eliquis and might increase your risk of bleeding or clotting while on Eliquis. To help avoid this, consult your healthcare provider or pharmacist prior to using any new prescription or non-prescription medications, including herbals, vitamins, non-steroidal anti-inflammatory drugs (NSAIDs) and supplements.  This website has more information on Eliquis (apixaban): http://www.eliquis.com/eliquis/home

## 2015-06-07 NOTE — Clinical Social Work Note (Signed)
Clinical Social Work Assessment  Patient Details  Name: [name] MRN: [id number] Date of Birth: 1945-07-16  Date of referral:  06/07/15               Reason for consult:  Facility Placement, Discharge Planning                Permission sought to share information with:  Chartered certified accountant granted to share information::  Yes, Verbal Permission Granted  Name::        Agency::  [facility]  Relationship::     Contact Information:     Housing/Transportation Living arrangements for the past 2 months:  [place] of Information:  Patient Patient Interpreter Needed:  None Criminal Activity/Legal Involvement Pertinent to Current Situation/Hospitalization:  No - Comment as needed Significant Relationships:  Spouse Lives with:  Spouse Do you feel safe going back to the place where you live?  Yes Need for family participation in patient care:  Yes (Comment) (Patient's husband active in patient's care.)  Care giving concerns:  Patient informed CSW that patient does not want to be placed in SNF, but agreeable to reviewing SNF bed options.   Social Worker assessment / plan:  CSW received referral for possible SNF placement at time of discharge. CSW spoke with patient who informed CSW that patient does NOT want to be placed in SNF, but agreeable to reviewing SNF bed options. Per RNCM, patient's husband states patient's husband is not able to care for patient at home. CSW to continue to follow and assist with discharge planning needs.  Employment status:  Retired Science writer) PT Recommendations:  [place] / Referral to community resources:  [place]  Patient/Family's Response to care:  Patient understanding and agreeable to CSW plan of care for CSW to complete SNF search.  Patient/Family's Understanding of and Emotional Response to Diagnosis, Current  Treatment, and Prognosis:  Patient understanding and agreeable to CSW plan of care for CSW to complete SNF search.  Emotional Assessment Appearance:  Appears stated age Attitude/Demeanor/Rapport:  Other (Appropriate) Affect (typically observed):  Accepting, Appropriate, Pleasant Orientation:  Oriented to Self, Oriented to Place, Oriented to  Time, Oriented to Situation Alcohol / Substance use:  Not Applicable Psych involvement (Current and /or in the community):  No (Comment) (Not appropriate on this admission.)  Discharge Needs  Concerns to be addressed:  No discharge needs identified Readmission within the last 30 days:  No Current discharge risk:  None Barriers to Discharge:  No Barriers Identified   Caroline Sauger, LCSW 06/07/2015, 2:18 PM [phone]

## 2015-06-07 NOTE — Progress Notes (Signed)
OT Cancellation Note  Patient Details Name: [name] MRN: [id number] DOB: 01/23/1946   Cancelled Treatment:    Reason Eval/Treat Not Completed: Fatigue/lethargy limiting ability to participate. Patient had finished working with PT and states she wants to rest.  Darrol Jump OTR/L 06/07/2015, 4:20 PM

## 2015-06-07 NOTE — Progress Notes (Signed)
Physical Therapy Treatment Patient Details Name: [name] MRN: [id number] DOB: 1945/06/30 Today's Date: 06/07/2015    History of Present Illness Patient is a 70 y/o female admitted with OA R knee now s/p TKA.  PMH positive for OA, obesity, DM,  diverticulitis, c-diff, breast CA with L mastectomy, gout, UTI, GI bleed.    PT Comments    Patient with ability to ambulate 27ft before fatigued and leaning on RW with forearms. Mod/max A for bed mobility and transfers with +2 for transfers at times for physical assist. Pt with flat affect and not responding to therapist's questions 50% time. Continue to recommend SNF for further skilled PT services. Pt needs to attempt stairs when able.   Follow Up Recommendations  SNF     Equipment Recommendations  None recommended by PT    Recommendations for Other Services       Precautions / Restrictions Precautions Precautions: Fall;Knee Restrictions Weight Bearing Restrictions: Yes RLE Weight Bearing: Weight bearing as tolerated    Mobility  Bed Mobility Overal bed mobility: Needs Assistance Bed Mobility: Supine to Sit     Supine to sit: Mod assist     General bed mobility comments: HOB flat; use of bedrails and mod A to elevate trunk and bring R LE to EOB with vc for technique  Transfers Overall transfer level: Needs assistance Equipment used: Rolling walker (2 wheeled) Transfers: Sit to/from Stand Sit to Stand: Mod assist;Max assist;+2 physical assistance (from EOB and BSC)         General transfer comment: max A +2 to power up into standing from EOB with vc for hand placement; mod A + 2 with two trials before achieving upright posture  Ambulation/Gait Ambulation/Gait assistance: Min guard Ambulation Distance (Feet): 15 Feet (76ft to BSC, 60ft) Assistive device: Rolling walker (2 wheeled) Gait Pattern/deviations: Step-to pattern;Antalgic;Trunk flexed     General Gait Details: vc for sequencing and maintaining upright  posture and position of RW; very slow steps with heavy use of bilat UE and tendency to lean on RW with forearms; became fatigued quickly with request for seated rest break after ambulating 10 ft   Stairs            Wheelchair Mobility    Modified Rankin (Stroke Patients Only)       Balance Overall balance assessment: Needs assistance Sitting-balance support: Feet supported Sitting balance-Leahy Scale: Good     Standing balance support: Bilateral upper extremity supported Standing balance-Leahy Scale: Poor                      Cognition Arousal/Alertness: Awake/alert Behavior During Therapy: Flat affect Overall Cognitive Status: Within Functional Limits for tasks assessed                      Exercises      General Comments        Pertinent Vitals/Pain Pain Assessment: Faces Faces Pain Scale: Hurts little more Pain Location: R knee Pain Descriptors / Indicators: Sore Pain Intervention(s): Limited activity within patient's tolerance;Monitored during session;Repositioned    Home Living                      Prior Function            PT Goals (current goals can now be found in the care plan section) Acute Rehab PT Goals Patient Stated Goal: decrease pain Progress towards PT goals: Not progressing toward goals - comment  Frequency  7X/week    PT Plan Current plan remains appropriate    Co-evaluation             End of Session Equipment Utilized During Treatment: Gait belt Activity Tolerance: Patient limited by pain Patient left: with call bell/phone within reach;in chair;with family/visitor present (RN giving pain meds)     Time: [id number] PT Time Calculation (min) (ACUTE ONLY): 19 min  Charges:  $Gait Training: 8-22 mins                    G Codes:      Sal[place] April, PTA Pager: [phone]   06/07/2015, 3:35 PM

## 2015-06-07 NOTE — NC FL2 (Signed)
[place] LEVEL OF CARE SCREENING TOOL     IDENTIFICATION  Patient Name: [name] Birthdate: July 12, 1945 Sex: female Admission Date (Current Location): 06/05/2015  [facility] and [place] Number:  Herbalist and Address:  [facility] [place], [place] [address], [place], [place] [address]      Provider Number: [id number]  Attending Physician Name and Address:  Ninetta Lights, MD  Relative Name and Phone Number:       Current Level of Care: Hospital Recommended Level of Care: [place] Prior Approval Number:    Date Approved/Denied:   PASRR Number: [id number]  Discharge Plan: SNF    Current Diagnoses: Patient Active Problem List   Diagnosis Date Noted  . 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 UTI (urinary tract infection) 11/27/2014  . UTI (lower urinary tract infection) 11/27/2014  . Acute cystitis with hematuria 11/13/2014  . Hemorrhoids, external 11/04/2014  . Wellness examination 10/03/2014  . Pain in joint, shoulder region 09/24/2014  . Cramp in limb 06/26/2014  . Rectal bleeding 05/02/2014  . External hemorrhoids 05/02/2014  . Hemorrhoids without complication [id number]  . Pain in joint, lower leg 02/01/2014  . Vitamin D deficiency 01/16/2014  . Gastroparesis 09/14/2013  . Other dysphagia 09/04/2013  . Nausea alone 09/04/2013  . Iron deficiency anemia, unspecified 08/28/2013  . Hypomagnesemia 05/11/2013  . Hypokalemia 05/11/2013  . Generalized weakness 05/11/2013  . Dehydration 11/25/2011  . Fatigue 11/16/2011  . Breast cancer ([place]) 09/29/2011  . Family history of breast cancer 08/19/2011  . Primary cancer of lower-inner quadrant of left female breast ([place]) 08/17/2011  . Neck pain on left side 07/29/2011  . Routine general medical examination at a health care facility 06/28/2011  . Edema 11/26/2010  . CLOSTRIDIUM DIFFICILE COLITIS 02/07/2010  . Blind loop syndrome 10/01/2009  .  Abdominal pain 10/01/2009  . Vitamin B 12 deficiency 08/30/2009  . 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 GOITER, MULTINODULAR 04/02/2009  . CONTACT DERMATITIS&OTHER ECZEMA DUE UNSPEC CAUSE 02/20/2009  . URINARY CALCULUS 10/11/2008  . ASYMPTOMATIC POSTMENOPAUSAL STATUS 10/11/2008  . BACK PAIN, CHRONIC 07/03/2008  . Esophageal reflux 06/12/2008  . Diarrhea 06/12/2008  . ABDOMINAL PAIN-RUQ 05/17/2008  . Diabetes ([employer]) 12/10/2006  . HYPERLIPIDEMIA 12/10/2006  . Gout 12/10/2006  . HYPERTENSION 12/10/2006  . DIVERTICULOSIS, COLON 12/10/2006  . OSTEOARTHRITIS 12/10/2006  . ESOPHAGEAL STRICTURE 08/23/2002  . HIATAL HERNIA 08/23/2002  . INTERNAL HEMORRHOIDS 02/02/2001    Orientation RESPIRATION BLADDER Height & Weight    Self, Time, Situation, Place  Normal Continent 5' 5.5" (166.4 cm) 218 lbs.  BEHAVIORAL SYMPTOMS/MOOD NEUROLOGICAL BOWEL NUTRITION STATUS  Other (Comment) (n/a)  (n/a) Continent Diet (Please see discharge summary.)  AMBULATORY STATUS COMMUNICATION OF NEEDS Skin   Limited Assist Verbally Surgical wounds                       Personal Care Assistance Level of Assistance  Bathing, Feeding, Dressing Bathing Assistance: Maximum assistance Feeding assistance: Independent Dressing Assistance: Maximum assistance     Functional Limitations Info   (n/a)          SPECIAL CARE FACTORS FREQUENCY  PT (By licensed PT), OT (By licensed OT)     PT Frequency: 5 OT Frequency: 5            Contractures      Additional Factors Info  Code Status, Allergies Code Status Info: FULL Allergies Info: Aspirin, Codeine,  Trazodone And Nefazodone, Flagyl, Iodine, Morphine And Related           Current Medications (06/07/2015):  This is the current hospital active medication list Current Facility-Administered Medications  Medication Dose Route Frequency Provider Last Rate Last Dose  . 0.9 % NaCl with KCl 20 mEq/ L  infusion   Intravenous Continuous  Aundra Dubin, PA-C 50 mL/hr at 06/06/15 2108    . aMILoride (MIDAMOR) tablet 5 mg  5 mg Oral BID Aundra Dubin, PA-C   5 mg at 06/07/15 [id number]  . apixaban (ELIQUIS) tablet 2.5 mg  2.5 mg Oral Q12H Aundra Dubin, PA-C   2.5 mg at 06/07/15 0954  . bisacodyl (DULCOLAX) suppository 10 mg  10 mg Rectal Daily PRN Aundra Dubin, PA-C      . dexamethasone (DECADRON) tablet 10 mg  10 mg Oral 3 times per day Ninetta Lights, MD   10 mg at 06/07/15 1356  . diazepam (VALIUM) tablet 2 mg  2 mg Oral Q8H PRN Aundra Dubin, PA-C   2 mg at 06/06/15 1557  . diphenhydrAMINE (BENADRYL) 12.5 MG/5ML elixir 12.5-25 mg  12.5-25 mg Oral Q4H PRN Aundra Dubin, PA-C      . docusate sodium (COLACE) capsule 100 mg  100 mg Oral BID Aundra Dubin, PA-C   100 mg at 06/06/15 1050  . ferrous sulfate tablet 325 mg  325 mg Oral Q breakfast Aundra Dubin, PA-C   325 mg at 06/07/15 [id number]  . fluorometholone (FML) 0.1 % ophthalmic suspension 1 drop  1 drop Both Eyes BID PRN Aundra Dubin, PA-C      . gabapentin (NEURONTIN) capsule 300 mg  300 mg Oral TID Aundra Dubin, PA-C   300 mg at 06/07/15 [id number]  . HYDROmorphone (DILAUDID) injection 0.5-1 mg  0.5-1 mg Intravenous Q2H PRN Aundra Dubin, PA-C   1 mg at 06/07/15 0320  . levothyroxine (SYNTHROID, LEVOTHROID) tablet 150 mcg  150 mcg Oral QAC breakfast Aundra Dubin, PA-C   150 mcg at 06/07/15 [id number]  . magnesium chloride (SLOW-MAG) 64 MG SR tablet 64 mg  1 tablet Oral TID Aundra Dubin, PA-C   64 mg at 06/07/15 0955  . magnesium citrate solution 1 Bottle  1 Bottle Oral Once PRN Aundra Dubin, PA-C      . magnesium oxide (MAG-OX) tablet 400 mg  400 mg Oral TID Aundra Dubin, PA-C   400 mg at 06/07/15 0955  . menthol-cetylpyridinium (CEPACOL) lozenge 3 mg  1 lozenge Oral PRN Aundra Dubin, PA-C       Or  . phenol (CHLORASEPTIC) mouth spray 1 spray  1 spray Mouth/Throat PRN Aundra Dubin, PA-C      . metoCLOPramide (REGLAN) tablet 5-10 mg  5-10 mg Oral Q8H PRN  Aundra Dubin, PA-C       Or  . metoCLOPramide (REGLAN) injection 5-10 mg  5-10 mg Intravenous Q8H PRN Aundra Dubin, PA-C      . ondansetron [facility]) tablet 4 mg  4 mg Oral Q6H PRN Aundra Dubin, PA-C       Or  . ondansetron [facility]) injection 4 mg  4 mg Intravenous Q6H PRN Aundra Dubin, PA-C      . oxyCODONE (Oxy IR/ROXICODONE) immediate release tablet 5-15 mg  5-15 mg Oral Q3H PRN Aundra Dubin, PA-C   15 mg at 06/07/15 1357  . pantoprazole (PROTONIX) EC tablet 40 mg  40  mg Oral BID Aundra Dubin, PA-C   40 mg at 06/07/15 [id number]  . polyethylene glycol (MIRALAX / GLYCOLAX) packet 17 g  17 g Oral Daily PRN Aundra Dubin, PA-C      . pravastatin (PRAVACHOL) tablet 20 mg  20 mg Oral q1800 Aundra Dubin, PA-C   20 mg at 06/06/15 1829  . saccharomyces boulardii (FLORASTOR) capsule 250 mg  250 mg Oral BID Aundra Dubin, PA-C   250 mg at 06/07/15 [id number]  . thiamine (VITAMIN B-1) tablet 100 mg  100 mg Oral Daily Aundra Dubin, PA-C   100 mg at 06/07/15 [id number]  . zolpidem (AMBIEN) tablet 5 mg  5 mg Oral QHS PRN Aundra Dubin, PA-C   5 mg at 06/06/15 0007   Facility-Administered Medications Ordered in Other Encounters  Medication Dose Route Frequency Provider Last Rate Last Dose  . acetaminophen (TYLENOL) tablet 1,000 mg  1,000 mg Oral Once Amada Kingfisher, MD   1,000 mg at 08/09/13 1658     Discharge Medications: Please see discharge summary for a list of discharge medications.  Relevant Imaging Results:  Relevant Lab Results:   Additional Information SSN: [id number]  Luna Kitchens [phone]

## 2015-06-07 NOTE — Clinical Social Work Placement (Signed)
   CLINICAL SOCIAL WORK PLACEMENT  NOTE  Date:  06/07/2015  Patient Details  Name: [name] MRN: [id number] Date of Birth: [birth year]  Clinical Social Work is seeking post-discharge placement for this patient at the [place] level of care (*CSW will initial, date and re-position this form in  chart as items are completed):  Yes   Patient/family provided with [facility] Work Department's list of facilities offering this level of care within the geographic area requested by the patient (or if unable, by the patient's family).  Yes   Patient/family informed of their freedom to choose among providers that offer the needed level of care, that participate in Medicare, Medicaid or managed care program needed by the patient, have an available bed and are willing to accept the patient.  Yes   Patient/family informed of [facility]'s ownership interest in [facility] and [facility], as well as of the fact that they are under no obligation to receive care at these facilities.  PASRR submitted to EDS on 06/07/15     PASRR number received on 06/07/15     Existing PASRR number confirmed on       FL2 transmitted to all facilities in geographic area requested by pt/family on 06/07/15     FL2 transmitted to all facilities within larger geographic area on       Patient informed that his/her managed care company has contracts with or will negotiate with certain facilities, including the following:            Patient/family informed of bed offers received.  Patient chooses bed at       Physician recommends and patient chooses bed at      Patient to be transferred to   on  .  Patient to be transferred to facility by       Patient family notified on   of transfer.  Name of family member notified:        PHYSICIAN Please sign FL2     Additional Comment:    _______________________________________________ Caroline Sauger, LCSW 06/07/2015,  2:25 PM

## 2015-06-07 NOTE — Progress Notes (Signed)
Subjective: 2 Days Post-Op Procedure(s) (LRB): RIGHT TOTAL KNEE ARTHROPLASTY (Right) Patient reports pain as severe.  No nausea/vomiting, lightheadedness/dizziness, chest pain/sob.    Objective: Vital signs in last 24 hours: Temp:  [98.5 F (36.9 C)-99.1 F (37.3 C)] 99.1 F (37.3 C) (01/06 0551) Pulse Rate:  [81-102] 102 (01/06 0551) Resp:  [18] 18 (01/06 0551) BP: (103-144)/(72-83) 133/77 mmHg (01/06 0551) SpO2:  [97 %-100 %] 98 % (01/06 0551)  Intake/Output from previous day: 01/05 0701 - 01/06 0700 In: -  Out: 300 [Urine:300] Intake/Output this shift: Total I/O In: -  Out: 300 [Urine:300]   Recent Labs  06/06/15 0445  HGB 10.7*    Recent Labs  06/06/15 0445  WBC 6.2  RBC 3.48*  HCT 32.1*  PLT PLATELET CLUMPS NOTED ON SMEAR, COUNT APPEARS DECREASED    Recent Labs  06/06/15 0445  NA 137  K 4.8  CL 104  CO2 23  BUN 12  CREATININE 0.95  GLUCOSE 125*  CALCIUM 8.6*   No results for input(s): LABPT, INR in the last 72 hours.  Neurologically intact Neurovascular intact Sensation intact distally Intact pulses distally Dorsiflexion/Plantar flexion intact Incision: no drainage No cellulitis present Compartment soft  Dry dressing change prn  Assessment/Plan: 2 Days Post-Op Procedure(s) (LRB): RIGHT TOTAL KNEE ARTHROPLASTY (Right) Advance diet Up with therapy Discharge to SNF once approved by insurance WBAT LLE ABLA-mild and stable Dry dressing change prn   Fannie Knee 06/07/2015, 6:41 AM

## 2015-06-07 NOTE — Care Management Important Message (Signed)
Important Message  Patient Details  Name: GENARA TURCOTTE MRN: [id number] Date of Birth: August 01, 1945   Medicare Important Message Given:  Yes    Barb Merino Trevelle Mcgurn 06/07/2015, 2:33 PM

## 2015-06-08 LAB — BASIC METABOLIC PANEL
ANION GAP: 10 (ref 5–15)
BUN: 22 mg/dL — ABNORMAL HIGH (ref 6–20)
CALCIUM: 8.8 mg/dL — AB (ref 8.9–10.3)
CO2: 23 mmol/L (ref 22–32)
CREATININE: 1.27 mg/dL — AB (ref 0.44–1.00)
Chloride: 103 mmol/L (ref 101–111)
GFR, EST AFRICAN AMERICAN: 48 mL/min — AB (ref >60)
GFR, EST NON AFRICAN AMERICAN: 41 mL/min — AB (ref >60)
GLUCOSE: 170 mg/dL — AB (ref 65–99)
Potassium: 4.9 mmol/L (ref 3.5–5.1)
Sodium: 136 mmol/L (ref 135–145)

## 2015-06-08 LAB — CBC
HCT: 26.8 % — ABNORMAL LOW (ref 36.0–46.0)
HEMOGLOBIN: 8.7 g/dL — AB (ref 12.0–15.0)
MCH: 30.4 pg (ref 26.0–34.0)
MCHC: 32.5 g/dL (ref 30.0–36.0)
MCV: 93.7 fL (ref 78.0–100.0)
PLATELETS: 179 10*3/uL (ref 150–400)
RBC: 2.86 MIL/uL — ABNORMAL LOW (ref 3.87–5.11)
RDW: 13.5 % (ref 11.5–15.5)
WBC: 5.4 10*3/uL (ref 4.0–10.5)

## 2015-06-08 LAB — TYPE AND SCREEN
ABO/RH(D): O NEG
ANTIBODY SCREEN: POSITIVE
DAT, IgG: NEGATIVE
Donor AG Type: NEGATIVE
Donor AG Type: NEGATIVE
UNIT DIVISION: 0
UNIT DIVISION: 0

## 2015-06-08 NOTE — Progress Notes (Signed)
Physical Therapy Treatment Patient Details Name: [name] MRN: [id number] DOB: [birth year] Today's Date: 06/08/2015    History of Present Illness Patient is a 70 y/o female admitted with OA R knee now s/p TKA.  PMH positive for OA, obesity, DM,  diverticulitis, c-diff, breast CA with L mastectomy, gout, UTI, GI bleed.    PT Comments    Pt is progressing well with her mobility. HEP handout given and reviewed.  Pt demonstrated safe ability to ascend and descend stairs simulating home entry.  Pt is due to d/c home this PM.  PT will follow acutely until d/c confirmed.   Follow Up Recommendations  SNF     Equipment Recommendations  None recommended by PT    Recommendations for Other Services   NA     Precautions / Restrictions Precautions Precautions: Fall;Knee Precaution Booklet Issued: Yes (comment) Precaution Comments: knee handout given, exercises and precautions reviewed. Required Braces or Orthoses:  (d/c) Restrictions RLE Weight Bearing: Weight bearing as tolerated    Mobility  Bed Mobility               General bed mobility comments: Pt already up on her feet  Transfers Overall transfer level: Needs assistance Equipment used: Rolling walker (2 wheeled) Transfers: Sit to/from Stand Sit to Stand: Min assist         General transfer comment: Min assist to help ensure slow, controlled descent to sit down in recliner chair.   Ambulation/Gait Ambulation/Gait assistance: Supervision;Min guard Ambulation Distance (Feet): 120 Feet Assistive device: Rolling walker (2 wheeled) Gait Pattern/deviations: Step-through pattern;Antalgic     General Gait Details: mildly antalgic gait pattern with good posture and safe use of RW.     Stairs Stairs: Yes Stairs assistance: Min guard Stair Management: Two rails;Step to pattern;One rail Right;One rail Left;Forwards;Sideways Number of Stairs: 10 (5 x 2) General stair comments: Reviewed both forward and sideways  stair education so that pt would have two options when approaching her stairs at home.  She thinks she can reach both handrails at one time, but was not positive. Assist for safety and balance, verbal cues for safe LE sequencing.          Balance Overall balance assessment: Needs assistance Sitting-balance support: Feet supported;No upper extremity supported Sitting balance-Leahy Scale: Good     Standing balance support: Bilateral upper extremity supported;Single extremity supported Standing balance-Leahy Scale: Fair                      Cognition Arousal/Alertness: Awake/alert Behavior During Therapy: Flat affect Overall Cognitive Status: Within Functional Limits for tasks assessed                      Exercises Total Joint Exercises Ankle Circles/Pumps: AROM;Both;20 reps Quad Sets: AROM;Right;10 reps Towel Squeeze: AROM;Both;10 reps Short Arc Quad: AROM;Right;10 reps Heel Slides: AAROM;Right;10 reps Hip ABduction/ADduction: AROM;Right;10 reps Straight Leg Raises: AAROM;Right;10 reps Long Arc Quad: AROM;Right;10 reps Knee Flexion: AROM;AAROM;Right;10 reps;Seated Goniometric ROM: 1-75        Pertinent Vitals/Pain Pain Assessment: 0-10 Pain Score: 7  Pain Location: right knee Pain Descriptors / Indicators: Aching;Burning Pain Intervention(s): Limited activity within patient's tolerance;Monitored during session;Repositioned           PT Goals (current goals can now be found in the care plan section) Acute Rehab PT Goals Patient Stated Goal: decrease pain Progress towards PT goals: Progressing toward goals    Frequency  7X/week    PT  Plan Current plan remains appropriate       End of Session   Activity Tolerance: Patient limited by pain Patient left: in chair;with call bell/phone within reach;with family/visitor present     Time: 1120-1140 PT Time Calculation (min) (ACUTE ONLY): 20 min  Charges:  $Gait Training: 8-22 mins                      Treasa Bradshaw B. [place]rt[place], [place], DPT [phone]   06/08/2015, 2:40 PM

## 2015-06-10 DIAGNOSIS — Z96651 Presence of right artificial knee joint: Secondary | ICD-10-CM | POA: Diagnosis not present

## 2015-06-11 DIAGNOSIS — Z471 Aftercare following joint replacement surgery: Secondary | ICD-10-CM | POA: Diagnosis not present

## 2015-06-11 DIAGNOSIS — M1990 Unspecified osteoarthritis, unspecified site: Secondary | ICD-10-CM | POA: Diagnosis not present

## 2015-06-11 DIAGNOSIS — E669 Obesity, unspecified: Secondary | ICD-10-CM | POA: Diagnosis not present

## 2015-06-11 DIAGNOSIS — Z96651 Presence of right artificial knee joint: Secondary | ICD-10-CM | POA: Diagnosis not present

## 2015-06-12 DIAGNOSIS — Z471 Aftercare following joint replacement surgery: Secondary | ICD-10-CM | POA: Diagnosis not present

## 2015-06-12 DIAGNOSIS — E669 Obesity, unspecified: Secondary | ICD-10-CM | POA: Diagnosis not present

## 2015-06-12 DIAGNOSIS — M1990 Unspecified osteoarthritis, unspecified site: Secondary | ICD-10-CM | POA: Diagnosis not present

## 2015-06-12 DIAGNOSIS — Z96651 Presence of right artificial knee joint: Secondary | ICD-10-CM | POA: Diagnosis not present

## 2015-06-13 DIAGNOSIS — E669 Obesity, unspecified: Secondary | ICD-10-CM | POA: Diagnosis not present

## 2015-06-13 DIAGNOSIS — Z471 Aftercare following joint replacement surgery: Secondary | ICD-10-CM | POA: Diagnosis not present

## 2015-06-13 DIAGNOSIS — M1990 Unspecified osteoarthritis, unspecified site: Secondary | ICD-10-CM | POA: Diagnosis not present

## 2015-06-13 DIAGNOSIS — Z96651 Presence of right artificial knee joint: Secondary | ICD-10-CM | POA: Diagnosis not present

## 2015-06-14 DIAGNOSIS — M1990 Unspecified osteoarthritis, unspecified site: Secondary | ICD-10-CM | POA: Diagnosis not present

## 2015-06-14 DIAGNOSIS — Z96651 Presence of right artificial knee joint: Secondary | ICD-10-CM | POA: Diagnosis not present

## 2015-06-14 DIAGNOSIS — Z471 Aftercare following joint replacement surgery: Secondary | ICD-10-CM | POA: Diagnosis not present

## 2015-06-14 DIAGNOSIS — E669 Obesity, unspecified: Secondary | ICD-10-CM | POA: Diagnosis not present

## 2015-06-17 DIAGNOSIS — E669 Obesity, unspecified: Secondary | ICD-10-CM | POA: Diagnosis not present

## 2015-06-17 DIAGNOSIS — Z471 Aftercare following joint replacement surgery: Secondary | ICD-10-CM | POA: Diagnosis not present

## 2015-06-17 DIAGNOSIS — M1990 Unspecified osteoarthritis, unspecified site: Secondary | ICD-10-CM | POA: Diagnosis not present

## 2015-06-17 DIAGNOSIS — Z96651 Presence of right artificial knee joint: Secondary | ICD-10-CM | POA: Diagnosis not present

## 2015-06-18 ENCOUNTER — Ambulatory Visit (HOSPITAL_COMMUNITY)
Admission: RE | Admit: 2015-06-18 | Discharge: 2015-06-18 | Disposition: A | Discharge status: Home / Self Care | Payer: Medicare Other | Source: Ambulatory Visit | Attending: Endocrinology | Admitting: Endocrinology

## 2015-06-18 ENCOUNTER — Other Ambulatory Visit (HOSPITAL_COMMUNITY): Payer: Self-pay | Admitting: Orthopedic Surgery

## 2015-06-18 DIAGNOSIS — M79661 Pain in right lower leg: Secondary | ICD-10-CM | POA: Diagnosis not present

## 2015-06-18 DIAGNOSIS — M7989 Other specified soft tissue disorders: Secondary | ICD-10-CM | POA: Insufficient documentation

## 2015-06-18 DIAGNOSIS — R52 Pain, unspecified: Secondary | ICD-10-CM

## 2015-06-18 DIAGNOSIS — Z96651 Presence of right artificial knee joint: Secondary | ICD-10-CM | POA: Diagnosis not present

## 2015-06-18 NOTE — Progress Notes (Signed)
Preliminary results by tech - Right Lower Ext. Venous Duplex Completed. Negative for deep and superficial vein thrombosis in the right leg.  Orvis Stann, BS, RDMS, RVT  

## 2015-06-19 DIAGNOSIS — Z471 Aftercare following joint replacement surgery: Secondary | ICD-10-CM | POA: Diagnosis not present

## 2015-06-19 DIAGNOSIS — M1990 Unspecified osteoarthritis, unspecified site: Secondary | ICD-10-CM | POA: Diagnosis not present

## 2015-06-19 DIAGNOSIS — Z96651 Presence of right artificial knee joint: Secondary | ICD-10-CM | POA: Diagnosis not present

## 2015-06-19 DIAGNOSIS — E669 Obesity, unspecified: Secondary | ICD-10-CM | POA: Diagnosis not present

## 2015-06-21 DIAGNOSIS — Z471 Aftercare following joint replacement surgery: Secondary | ICD-10-CM | POA: Diagnosis not present

## 2015-06-21 DIAGNOSIS — E669 Obesity, unspecified: Secondary | ICD-10-CM | POA: Diagnosis not present

## 2015-06-21 DIAGNOSIS — Z96651 Presence of right artificial knee joint: Secondary | ICD-10-CM | POA: Diagnosis not present

## 2015-06-21 DIAGNOSIS — M1990 Unspecified osteoarthritis, unspecified site: Secondary | ICD-10-CM | POA: Diagnosis not present

## 2015-06-24 DIAGNOSIS — M6281 Muscle weakness (generalized): Secondary | ICD-10-CM | POA: Diagnosis not present

## 2015-06-24 DIAGNOSIS — Z471 Aftercare following joint replacement surgery: Secondary | ICD-10-CM | POA: Diagnosis not present

## 2015-06-24 DIAGNOSIS — R262 Difficulty in walking, not elsewhere classified: Secondary | ICD-10-CM | POA: Diagnosis not present

## 2015-06-24 DIAGNOSIS — M1711 Unilateral primary osteoarthritis, right knee: Secondary | ICD-10-CM | POA: Diagnosis not present

## 2015-06-26 DIAGNOSIS — M1711 Unilateral primary osteoarthritis, right knee: Secondary | ICD-10-CM | POA: Diagnosis not present

## 2015-06-26 DIAGNOSIS — M6281 Muscle weakness (generalized): Secondary | ICD-10-CM | POA: Diagnosis not present

## 2015-06-26 DIAGNOSIS — R262 Difficulty in walking, not elsewhere classified: Secondary | ICD-10-CM | POA: Diagnosis not present

## 2015-06-26 DIAGNOSIS — Z471 Aftercare following joint replacement surgery: Secondary | ICD-10-CM | POA: Diagnosis not present

## 2015-07-01 DIAGNOSIS — M1711 Unilateral primary osteoarthritis, right knee: Secondary | ICD-10-CM | POA: Diagnosis not present

## 2015-07-01 DIAGNOSIS — M6281 Muscle weakness (generalized): Secondary | ICD-10-CM | POA: Diagnosis not present

## 2015-07-01 DIAGNOSIS — R262 Difficulty in walking, not elsewhere classified: Secondary | ICD-10-CM | POA: Diagnosis not present

## 2015-07-01 DIAGNOSIS — Z471 Aftercare following joint replacement surgery: Secondary | ICD-10-CM | POA: Diagnosis not present

## 2015-07-02 ENCOUNTER — Telehealth: Payer: Self-pay | Admitting: Endocrinology

## 2015-07-02 MED ORDER — ZOLPIDEM TARTRATE 5 MG PO TABS: 5.0000 mg | ORAL_TABLET | Freq: Every evening | ORAL | Status: DC | PRN

## 2015-07-02 NOTE — Telephone Encounter (Signed)
Pt needs refill on ambien and lamoda called into [facility]

## 2015-07-02 NOTE — Telephone Encounter (Signed)
i printed rx for ambien i am not the prescriber of lomotil

## 2015-07-02 NOTE — Addendum Note (Signed)
Addended by: Renato Shin on: 07/02/2015 04:57 PM   Modules accepted: Orders, Medications

## 2015-07-02 NOTE — Telephone Encounter (Signed)
Pt is requesting a refill on her ambien and lomotil. I could not locate the medication on the current list.  Please advise if ok to refill.

## 2015-07-03 NOTE — Telephone Encounter (Signed)
Pt notified rx for Ambien has been sent and to contact [facility] for the refill on lomotil. Pt verbalized understanding.

## 2015-07-14 ENCOUNTER — Other Ambulatory Visit: Payer: Self-pay | Admitting: Gastroenterology

## 2015-07-16 DIAGNOSIS — Z96641 Presence of right artificial hip joint: Secondary | ICD-10-CM | POA: Diagnosis not present

## 2015-08-15 ENCOUNTER — Other Ambulatory Visit: Payer: Self-pay

## 2015-08-15 DIAGNOSIS — C50912 Malignant neoplasm of unspecified site of left female breast: Secondary | ICD-10-CM

## 2015-08-15 DIAGNOSIS — C50312 Malignant neoplasm of lower-inner quadrant of left female breast: Secondary | ICD-10-CM

## 2015-08-18 NOTE — Assessment & Plan Note (Signed)
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 Currently on observation  Breast cancer surveillance: 1. Breast and chest wall exam 08/19/2015 is normal 2. Mammogram 10/08/2014 is normal 3. Bone density test September 2015 showed a T score of -0.7 normal range  Return to clinic in 1 year for follow-up

## 2015-08-19 ENCOUNTER — Other Ambulatory Visit (HOSPITAL_BASED_OUTPATIENT_CLINIC_OR_DEPARTMENT_OTHER): Payer: Medicare Other

## 2015-08-19 ENCOUNTER — Encounter: Payer: Self-pay | Admitting: Hematology and Oncology

## 2015-08-19 ENCOUNTER — Ambulatory Visit (HOSPITAL_BASED_OUTPATIENT_CLINIC_OR_DEPARTMENT_OTHER): Payer: Medicare Other | Admitting: Hematology and Oncology

## 2015-08-19 ENCOUNTER — Telehealth: Payer: Self-pay | Admitting: Hematology and Oncology

## 2015-08-19 VITALS — BP 117/85 | HR 85 | Temp 97.5°F | Resp 19 | Wt 212.9 lb

## 2015-08-19 DIAGNOSIS — C50912 Malignant neoplasm of unspecified site of left female breast: Secondary | ICD-10-CM

## 2015-08-19 DIAGNOSIS — R944 Abnormal results of kidney function studies: Secondary | ICD-10-CM | POA: Diagnosis not present

## 2015-08-19 DIAGNOSIS — Z853 Personal history of malignant neoplasm of breast: Secondary | ICD-10-CM

## 2015-08-19 DIAGNOSIS — C50312 Malignant neoplasm of lower-inner quadrant of left female breast: Secondary | ICD-10-CM

## 2015-08-19 DIAGNOSIS — Z171 Estrogen receptor negative status [ER-]: Secondary | ICD-10-CM

## 2015-08-19 LAB — CBC WITH DIFFERENTIAL/PLATELET
BASO%: 0.3 % (ref 0.0–2.0)
Basophils Absolute: 0 10*3/uL (ref 0.0–0.1)
EOS ABS: 0 10*3/uL (ref 0.0–0.5)
EOS%: 1.2 % (ref 0.0–7.0)
HCT: 33.7 % — ABNORMAL LOW (ref 34.8–46.6)
HGB: 11.1 g/dL — ABNORMAL LOW (ref 11.6–15.9)
LYMPH%: 40.8 % (ref 14.0–49.7)
MCH: 30.1 pg (ref 25.1–34.0)
MCHC: 32.9 g/dL (ref 31.5–36.0)
MCV: 91.3 fL (ref 79.5–101.0)
MONO#: 0.4 10*3/uL (ref 0.1–0.9)
MONO%: 10.4 % (ref 0.0–14.0)
NEUT%: 47.3 % (ref 38.4–76.8)
NEUTROS ABS: 1.6 10*3/uL (ref 1.5–6.5)
PLATELETS: 209 10*3/uL (ref 145–400)
RBC: 3.69 10*6/uL — AB (ref 3.70–5.45)
RDW: 12.9 % (ref 11.2–14.5)
WBC: 3.5 10*3/uL — AB (ref 3.9–10.3)
lymph#: 1.4 10*3/uL (ref 0.9–3.3)

## 2015-08-19 LAB — COMPREHENSIVE METABOLIC PANEL
ALT: 15 U/L (ref 0–55)
ANION GAP: 7 meq/L (ref 3–11)
AST: 21 U/L (ref 5–34)
Albumin: 3.8 g/dL (ref 3.5–5.0)
Alkaline Phosphatase: 85 U/L (ref 40–150)
BILIRUBIN TOTAL: 0.69 mg/dL (ref 0.20–1.20)
BUN: 16.2 mg/dL (ref 7.0–26.0)
CHLORIDE: 107 meq/L (ref 98–109)
CO2: 26 meq/L (ref 22–29)
Calcium: 9.5 mg/dL (ref 8.4–10.4)
Creatinine: 1.2 mg/dL — ABNORMAL HIGH (ref 0.6–1.1)
EGFR: 53 mL/min/{1.73_m2} — AB (ref >90)
GLUCOSE: 91 mg/dL (ref 70–140)
POTASSIUM: 4.4 meq/L (ref 3.5–5.1)
SODIUM: 139 meq/L (ref 136–145)
TOTAL PROTEIN: 7.5 g/dL (ref 6.4–8.3)

## 2015-08-19 NOTE — Telephone Encounter (Signed)
appt made per 3/20 pof and avs printed

## 2015-08-19 NOTE — Progress Notes (Signed)
Patient Care Team: Renato Shin, MD as PCP - General Sable Feil, MD as Attending Physician (Gastroenterology) Consuela Mimes, MD (Hematology and Oncology) Fanny Skates, MD as Attending Physician (General Surgery) Jesusita Oka, RN as Registered Nurse Amada Kingfisher, MD as Consulting Physician (Hematology and Oncology) Sharyne Peach, MD as Consulting Physician (Ophthalmology)  DIAGNOSIS: Primary cancer of lower-inner quadrant of left female breast [facility])   Staging form: Breast, AJCC 7th Edition     Clinical: Stage IA (T1c, N0, cM0) - Unsigned       Staging comments: Staged In Breast Conference      Pathologic: No stage assigned - Unsigned  CHIEF COMPLIANT:  Follow-up of left breast cancer  INTERVAL HISTORY: [name] is a 70 year old with above-mentioned history of left breast cancer currently on surveillance observation. She denies any lumps or nodules in the breasts. She has had a right knee replacement surgery and is recovering very well from it. Denies any lumps or nodules in the breasts.  REVIEW OF SYSTEMS:   Constitutional: Denies fevers, chills or abnormal weight loss Eyes: Denies blurriness of vision Ears, nose, mouth, throat, and face: Denies mucositis or sore throat Respiratory: Denies cough, dyspnea or wheezes Cardiovascular: Denies palpitation, chest discomfort Gastrointestinal:  Denies nausea, heartburn or change in bowel habits Skin: Denies abnormal skin rashes Lymphatics: Denies new lymphadenopathy or easy bruising Neurological:Denies numbness, tingling or new weaknesses Behavioral/Psych: Mood is stable, no new changes  Extremities: No lower extremity edema Breast:  denies any pain or lumps or nodules in either breasts All other systems were reviewed with the patient and are negative.  I have reviewed the past medical history, past surgical history, social history and family history with the patient and they are unchanged from previous  note.  ALLERGIES:  is allergic to aspirin; codeine; trazodone and nefazodone; flagyl; iodine; and morphine and related.  MEDICATIONS:  Current Outpatient Prescriptions  Medication Sig Dispense Refill  . aMILoride (MIDAMOR) 5 MG tablet Take 5 mg by mouth 2 (two) times daily.   11  . apixaban (ELIQUIS) 2.5 MG TABS tablet Take 1 tab po q12 hours x 21 days following surgery to prevent blood clots. 42 tablet 0  . calcium carbonate (TUMS - DOSED IN MG ELEMENTAL CALCIUM) 500 MG chewable tablet Chew 1 tablet by mouth as needed for indigestion or heartburn.    . Calcium Carbonate-Vitamin D (CALCIUM-VITAMIN D) 600-200 MG-UNIT CAPS Take 1 capsule by mouth daily.      . calcium gluconate 650 MG tablet Take 1 tablet (650 mg total) by mouth daily. 30 tablet 2  . cyanocobalamin (,VITAMIN B-12,) 1000 MCG/ML injection Inject 1,000 mcg into the muscle every 30 (thirty) days.     . diazepam (VALIUM) 2 MG tablet Take 1 tablet (2 mg total) by mouth every 8 (eight) hours as needed for muscle spasms. 20 tablet 0  . ferrous sulfate 325 (65 FE) MG tablet Take 325 mg by mouth daily with breakfast.    . fluorometholone (FML) 0.1 % ophthalmic suspension Place 1 drop into both eyes 2 (two) times daily as needed (Dry Eyes).     . gabapentin (NEURONTIN) 300 MG capsule Take 1 capsule (300 mg total) by mouth 3 (three) times daily. 90 capsule 3  . levothyroxine (SYNTHROID, LEVOTHROID) 150 MCG tablet TAKE 1 TABLET BY MOUTH DAILY BEFORE BREAKFAST. 90 tablet 1  . loperamide (IMODIUM) 2 MG capsule Take 2 capsules (4 mg total) by mouth every 4 (four) hours as needed for diarrhea  or loose stools. 30 capsule 0  . lovastatin (MEVACOR) 20 MG tablet TAKE 1 TABLET (20 MG TOTAL) BY MOUTH DAILY AT 6 PM. 90 tablet 1  . magnesium chloride (SLOW-MAG) 64 MG TBEC SR tablet Take 1 tablet by mouth 3 (three) times daily.    . magnesium oxide (MAG-OX) 400 (241.3 MG) MG tablet Take 400 mg by mouth 3 (three) times daily.   11  . ondansetron (ZOFRAN  ODT) 4 MG disintegrating tablet Take 1 tablet (4 mg total) by mouth every 8 (eight) hours as needed for nausea. 30 tablet 2  . oxyCODONE-acetaminophen (PERCOCET) 5-325 MG tablet Take 1-2 tablets by mouth every 4 (four) hours as needed for severe pain. 60 tablet 0  . pantoprazole (PROTONIX) 40 MG tablet TAKE 1 TABLET 30 MINUTES BEFORE BREAKFAST AND SUPPER 180 tablet 1  . pramoxine-hydrocortisone (ANALPRAM HC) cream Apply topically 2 (two) times daily. (Patient taking differently: Apply 1 application topically 2 (two) times daily as needed. ) 30 g 0  . ranitidine (ZANTAC) 300 MG capsule TAKE ONE CAPSULE BY MOUTH AT BEDTIME 30 capsule 4  . saccharomyces boulardii (FLORASTOR) 250 MG capsule Take 1 capsule (250 mg total) by mouth 2 (two) times daily. 60 capsule 1  . thiamine 100 MG tablet Take 100 mg by mouth daily.      . Vitamin D, Ergocalciferol, (DRISDOL) 50000 UNITS CAPS capsule TAKE 1 TABLET TWICE A WEEK 10 capsule 5  . zolpidem (AMBIEN) 5 MG tablet Take 1 tablet (5 mg total) by mouth at bedtime as needed. for sleep 30 tablet 5   No current facility-administered medications for this visit.   Facility-Administered Medications Ordered in Other Visits  Medication Dose Route Frequency Provider Last Rate Last Dose  . acetaminophen (TYLENOL) tablet 1,000 mg  1,000 mg Oral Once Amada Kingfisher, MD   1,000 mg at 08/09/13 1658    PHYSICAL EXAMINATION: ECOG PERFORMANCE STATUS: 1 - Symptomatic but completely ambulatory  Filed Vitals:   08/19/15 1037  BP: 117/85  Pulse: 85  Temp: 97.5 F (36.4 C)  Resp: 19   Filed Weights   08/19/15 1037  Weight: 212 lb 14.4 oz (96.571 kg)    GENERAL:alert, no distress and comfortable SKIN: skin color, texture, turgor are normal, no rashes or significant lesions EYES: normal, Conjunctiva are pink and non-injected, sclera clear OROPHARYNX:no exudate, no erythema and lips, buccal mucosa, and tongue normal  NECK: supple, thyroid normal size, non-tender, without  nodularity LYMPH:  no palpable lymphadenopathy in the cervical, axillary or inguinal LUNGS: clear to auscultation and percussion with normal breathing effort HEART: regular rate & rhythm and no murmurs and no lower extremity edema ABDOMEN:abdomen soft, non-tender and normal bowel sounds MUSCULOSKELETAL:no cyanosis of digits and no clubbing  NEURO: alert & oriented x 3 with fluent speech, no focal motor/sensory deficits EXTREMITIES: No lower extremity edema BREAST: No palpable masses or nodules in either right or left breasts. No palpable axillary supraclavicular or infraclavicular adenopathy no breast tenderness or nipple discharge. (exam performed in the presence of a chaperone)  LABORATORY DATA:  I have reviewed the data as listed   Chemistry      Component Value Date/Time   NA 139 08/19/2015 1026   NA 136 06/08/2015 0430   K 4.4 08/19/2015 1026   K 4.9 06/08/2015 0430   CL 103 06/08/2015 0430   CL 107 10/20/2012 1102   CO2 26 08/19/2015 1026   CO2 23 06/08/2015 0430   BUN 16.2 08/19/2015 1026  BUN 22* 06/08/2015 0430   CREATININE 1.2* 08/19/2015 1026   CREATININE 1.27* 06/08/2015 0430   CREATININE 0.88 03/31/2011 1550      Component Value Date/Time   CALCIUM 9.5 08/19/2015 1026   CALCIUM 8.8* 06/08/2015 0430   CALCIUM 9.5 05/15/2010 2153   ALKPHOS 85 08/19/2015 1026   ALKPHOS 78 05/24/2015 0835   AST 21 08/19/2015 1026   AST 23 05/24/2015 0835   ALT 15 08/19/2015 1026   ALT 16 05/24/2015 0835   BILITOT 0.69 08/19/2015 1026   BILITOT 0.4 05/24/2015 0835       Lab Results  Component Value Date   WBC 3.5* 08/19/2015   HGB 11.1* 08/19/2015   HCT 33.7* 08/19/2015   MCV 91.3 08/19/2015   PLT 209 08/19/2015   NEUTROABS 1.6 08/19/2015     ASSESSMENT & PLAN:  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 Currently on observation  Breast cancer surveillance: 1. Breast and chest wall exam 08/19/2015 is normal 2. Mammogram 10/08/2014 is normal 3. Bone density test September 2015 showed a T score of -0.7 normal range  Patient had a right knee replacement surgery in January 2017 and became anemic postoperatively but today her hemoglobin is up to 11.1. Elevated creatinine: I discussed with her about drinking more water and hydration.  Return to clinic in 1 year for follow-up And after that we will have her follow with survivorship clinic.   No orders of the defined types were placed in this encounter.   The patient has a good understanding of the overall plan. she agrees with it. she will call with any problems that may develop before the next visit here.   Rulon Eisenmenger, MD 08/19/2015

## 2015-08-21 ENCOUNTER — Other Ambulatory Visit: Payer: Self-pay | Admitting: Endocrinology

## 2015-08-27 DIAGNOSIS — Z96651 Presence of right artificial knee joint: Secondary | ICD-10-CM | POA: Diagnosis not present

## 2015-09-03 ENCOUNTER — Other Ambulatory Visit: Payer: Self-pay | Admitting: Endocrinology

## 2015-09-03 DIAGNOSIS — Z853 Personal history of malignant neoplasm of breast: Secondary | ICD-10-CM

## 2015-09-24 ENCOUNTER — Other Ambulatory Visit: Payer: Self-pay | Admitting: Endocrinology

## 2015-09-24 NOTE — Telephone Encounter (Signed)
Please advise if ok to refill med. Thanks!

## 2015-09-27 ENCOUNTER — Telehealth: Payer: Self-pay | Admitting: Endocrinology

## 2015-09-27 MED ORDER — OXYCODONE-ACETAMINOPHEN 5-325 MG PO TABS: 1.0000 | ORAL_TABLET | ORAL | Status: DC | PRN

## 2015-09-27 NOTE — Telephone Encounter (Signed)
Patient is having stomach cramps and need her medication Hydrocodone.

## 2015-09-27 NOTE — Telephone Encounter (Signed)
Unable to reach pt. Will try again at a later time.  

## 2015-09-27 NOTE — Telephone Encounter (Signed)
Pt advised Rx is ready for pick up. Rx placed upfront. 

## 2015-09-27 NOTE — Telephone Encounter (Signed)
i printed 

## 2015-09-27 NOTE — Telephone Encounter (Signed)
See noted below and please advise, Thanks!

## 2015-10-10 ENCOUNTER — Ambulatory Visit
Admission: RE | Admit: 2015-10-10 | Discharge: 2015-10-10 | Disposition: A | Discharge status: Home / Self Care | Payer: Medicare Other | Source: Ambulatory Visit | Attending: Endocrinology | Admitting: Endocrinology

## 2015-10-10 DIAGNOSIS — R928 Other abnormal and inconclusive findings on diagnostic imaging of breast: Secondary | ICD-10-CM | POA: Diagnosis not present

## 2015-10-10 DIAGNOSIS — Z853 Personal history of malignant neoplasm of breast: Secondary | ICD-10-CM

## 2015-10-16 ENCOUNTER — Ambulatory Visit (INDEPENDENT_AMBULATORY_CARE_PROVIDER_SITE_OTHER): Payer: Medicare Other | Admitting: Gastroenterology

## 2015-10-16 ENCOUNTER — Encounter: Payer: Self-pay | Admitting: Gastroenterology

## 2015-10-16 VITALS — BP 110/76 | HR 72 | Ht 65.0 in | Wt 213.6 lb

## 2015-10-16 DIAGNOSIS — R1030 Lower abdominal pain, unspecified: Secondary | ICD-10-CM

## 2015-10-16 DIAGNOSIS — R197 Diarrhea, unspecified: Secondary | ICD-10-CM | POA: Diagnosis not present

## 2015-10-16 MED ORDER — DICYCLOMINE HCL 10 MG PO CAPS: 10.0000 mg | ORAL_CAPSULE | Freq: Three times a day (TID) | ORAL | Status: DC

## 2015-10-16 MED ORDER — CIPROFLOXACIN HCL 500 MG PO TABS: 500.0000 mg | ORAL_TABLET | Freq: Two times a day (BID) | ORAL | Status: DC

## 2015-10-16 NOTE — Patient Instructions (Addendum)
Your physician has requested that you go to the basement for the following lab work before leaving today:GI pathogen panel.  Stop your magnesium oxide and magnesium chloride x 5 days. If this does not help your diarrhea then pick up the prescription for Cipro from your pharmacy.   We have sent the following medications to your pharmacy for you to pick up at your convenience:bentyl.  Call back in 6 weeks if your symptoms have not improved.   Thank you for choosing me and [facility].  Pricilla Riffle. Dagoberto Ligas., MD., Marval Regal

## 2015-10-16 NOTE — Progress Notes (Signed)
    History of Present Illness: This is a 70 year old female who was previously followed by Dr. Sharlett Iles. She is accompanied by her husband. She has a history of GERD/peptic strictures, C. difficile, chronic abdominal pain, diverticular disease and chronic diarrhea. She has had multiple abdominal surgeries including hysterectomy, bilateral salpingo-oophorectomy and cholecystectomy. She has had multiple small bowel obstructions and lysis of adhesions. She has been felt to have small bowel bacterial overgrowth and possibly bile salt diarrhea past. She states her chronic diarrhea has worsened over the past few months now having 10-12 loose stools per day without bleeding with occasional fecal incontinence. She denies any recent medication changes, diet changes or antibiotic usage. She has chronic left flank pain and mild lower abdominal pain. Colonoscopy performed by Dr. Sharlett Iles in May 2012 showed sigmoid colon anastomosis as well as internal and external hemorrhoids.   Current Medications, Allergies, Past Medical History, Past Surgical History, Family History and Social History were reviewed in [employer] record.  Physical Exam: General: Well developed, well nourished, no acute distress Head: Normocephalic and atraumatic Eyes:  sclerae anicteric, EOMI Ears: Normal auditory acuity Mouth: No deformity or lesions Lungs: Clear throughout to auscultation Heart: Regular rate and rhythm; no murmurs, rubs or bruits Abdomen: Soft, mild lower abdominal tenderness and non distended. No masses, hepatosplenomegaly or hernias noted. Normal Bowel sounds Musculoskeletal: Symmetrical with no gross deformities  Pulses:  Normal pulses noted Extremities: No clubbing, cyanosis, edema or deformities noted Neurological: Alert oriented x 4, grossly nonfocal Psychological:  Alert and cooperative. Normal mood and affect  Assessment and Recommendations:  1. Exacerbation of chronic diarrhea and  chronic lower abdominal pain. Presumed SIBO. Possible bile salt diarrhea. Rule out infectious diarrhea. Rule out side effect of magnesium. At least one component of her abdominal pain is related to adhesions. Obtain GI pathogen panel. I asked her to discontinue all magnesium products for 5 days and assess response of her diarrhea. If it completely resolves no further treatment planned. If her diarrhea does not resolve begin a 14 day course of Cipro 500 mg twice daily. Consider the addition of cholestyramine or WelChol symptoms persist. Begin dicyclomine 10 mg 3 times a day. REV in 4-6 weeks.

## 2015-10-17 ENCOUNTER — Telehealth: Payer: Self-pay | Admitting: Endocrinology

## 2015-10-17 ENCOUNTER — Other Ambulatory Visit: Payer: Medicare Other

## 2015-10-17 DIAGNOSIS — R197 Diarrhea, unspecified: Secondary | ICD-10-CM

## 2015-10-17 NOTE — Telephone Encounter (Signed)
I contacted the pt and advised of note below. Pt voiced understanding.  

## 2015-10-17 NOTE — Telephone Encounter (Signed)
We rx here, but refill is not yet due

## 2015-10-17 NOTE — Telephone Encounter (Signed)
Patient stated she went to her GI doctor yesterday for pain in stomach, Dr Silvio Pate ask if Dr Loanne Drilling will prescribed her oxycodone. Please advise

## 2015-10-17 NOTE — Telephone Encounter (Signed)
See note below and please advise, Thanks! 

## 2015-10-18 LAB — GASTROINTESTINAL PATHOGEN PANEL PCR
C. DIFFICILE TOX A/B, PCR: NOT DETECTED
CAMPYLOBACTER, PCR: NOT DETECTED
Cryptosporidium, PCR: NOT DETECTED
E COLI (STEC) STX1/STX2, PCR: NOT DETECTED
E coli (ETEC) LT/ST PCR: NOT DETECTED
E coli 0157, PCR: NOT DETECTED
Giardia lamblia, PCR: NOT DETECTED
NOROVIRUS, PCR: NOT DETECTED
ROTAVIRUS, PCR: NOT DETECTED
SALMONELLA, PCR: NOT DETECTED
SHIGELLA, PCR: NOT DETECTED

## 2015-10-21 ENCOUNTER — Other Ambulatory Visit: Payer: Self-pay | Admitting: Endocrinology

## 2015-10-21 NOTE — Telephone Encounter (Signed)
please call patient: Ov is due 

## 2015-10-21 NOTE — Telephone Encounter (Signed)
Please advise if ok to refill. Thanks 

## 2015-10-21 NOTE — Telephone Encounter (Signed)
I contacted the pt and requested a call back from her to schedule her appointment.

## 2015-10-29 ENCOUNTER — Encounter: Payer: Self-pay | Admitting: Endocrinology

## 2015-10-29 ENCOUNTER — Ambulatory Visit (INDEPENDENT_AMBULATORY_CARE_PROVIDER_SITE_OTHER): Payer: Medicare Other | Admitting: Endocrinology

## 2015-10-29 DIAGNOSIS — D509 Iron deficiency anemia, unspecified: Secondary | ICD-10-CM | POA: Diagnosis not present

## 2015-10-29 DIAGNOSIS — E119 Type 2 diabetes mellitus without complications: Secondary | ICD-10-CM

## 2015-10-29 DIAGNOSIS — E785 Hyperlipidemia, unspecified: Secondary | ICD-10-CM

## 2015-10-29 DIAGNOSIS — M109 Gout, unspecified: Secondary | ICD-10-CM | POA: Diagnosis not present

## 2015-10-29 DIAGNOSIS — E559 Vitamin D deficiency, unspecified: Secondary | ICD-10-CM | POA: Diagnosis not present

## 2015-10-29 LAB — BASIC METABOLIC PANEL
BUN: 11 mg/dL (ref 6–23)
CALCIUM: 9.2 mg/dL (ref 8.4–10.5)
CO2: 27 mEq/L (ref 19–32)
Chloride: 107 mEq/L (ref 96–112)
Creatinine, Ser: 1.02 mg/dL (ref 0.40–1.20)
GFR: 68.59 mL/min (ref >60.00)
Glucose, Bld: 98 mg/dL (ref 70–99)
Potassium: 3.6 mEq/L (ref 3.5–5.1)
SODIUM: 141 meq/L (ref 135–145)

## 2015-10-29 LAB — CBC WITH DIFFERENTIAL/PLATELET
BASOS ABS: 0 10*3/uL (ref 0.0–0.1)
Basophils Relative: 0.3 % (ref 0.0–3.0)
EOS PCT: 1.5 % (ref 0.0–5.0)
Eosinophils Absolute: 0.1 10*3/uL (ref 0.0–0.7)
HCT: 32.3 % — ABNORMAL LOW (ref 36.0–46.0)
Hemoglobin: 10.7 g/dL — ABNORMAL LOW (ref 12.0–15.0)
LYMPHS ABS: 1.3 10*3/uL (ref 0.7–4.0)
Lymphocytes Relative: 27.9 % (ref 12.0–46.0)
MCHC: 33 g/dL (ref 30.0–36.0)
MCV: 89.9 fl (ref 78.0–100.0)
MONO ABS: 0.3 10*3/uL (ref 0.1–1.0)
MONOS PCT: 7.2 % (ref 3.0–12.0)
NEUTROS ABS: 2.9 10*3/uL (ref 1.4–7.7)
NEUTROS PCT: 63.1 % (ref 43.0–77.0)
PLATELETS: 262 10*3/uL (ref 150.0–400.0)
RBC: 3.59 Mil/uL — ABNORMAL LOW (ref 3.87–5.11)
RDW: 14.6 % (ref 11.5–15.5)
WBC: 4.7 10*3/uL (ref 4.0–10.5)

## 2015-10-29 LAB — LIPID PANEL
CHOL/HDL RATIO: 3
Cholesterol: 161 mg/dL (ref 0–200)
HDL: 55.5 mg/dL (ref >39.00)
NONHDL: 105.01
Triglycerides: 280 mg/dL — ABNORMAL HIGH (ref 0.0–149.0)
VLDL: 56 mg/dL — ABNORMAL HIGH (ref 0.0–40.0)

## 2015-10-29 LAB — MAGNESIUM: Magnesium: 1.2 mg/dL — ABNORMAL LOW (ref 1.5–2.5)

## 2015-10-29 LAB — IBC PANEL
IRON: 108 ug/dL (ref 42–145)
Saturation Ratios: 27.3 % (ref 20.0–50.0)
Transferrin: 283 mg/dL (ref 212.0–360.0)

## 2015-10-29 LAB — URIC ACID: Uric Acid, Serum: 7.1 mg/dL — ABNORMAL HIGH (ref 2.4–7.0)

## 2015-10-29 LAB — HEMOGLOBIN A1C: HEMOGLOBIN A1C: 6 % (ref 4.6–6.5)

## 2015-10-29 LAB — LDL CHOLESTEROL, DIRECT: Direct LDL: 53 mg/dL

## 2015-10-29 LAB — TSH: TSH: 7.57 u[IU]/mL — AB (ref 0.35–4.50)

## 2015-10-29 LAB — VITAMIN D 25 HYDROXY (VIT D DEFICIENCY, FRACTURES): VITD: 25.57 ng/mL — AB (ref 30.00–100.00)

## 2015-10-29 MED ORDER — OXYCODONE-ACETAMINOPHEN 5-325 MG PO TABS: 1.0000 | ORAL_TABLET | ORAL | Status: DC | PRN

## 2015-10-29 MED ORDER — LEVOTHYROXINE SODIUM 175 MCG PO TABS: 175.0000 ug | ORAL_TABLET | Freq: Every day | ORAL | Status: DC

## 2015-10-29 MED ORDER — ALLOPURINOL 100 MG PO TABS: 100.0000 mg | ORAL_TABLET | Freq: Every day | ORAL | Status: DC

## 2015-10-29 NOTE — Progress Notes (Signed)
Subjective:    Patient ID: [name], female    DOB: 1945-10-26, 70 y.o.   MRN: [id number]  HPI  The state of at least three ongoing medical problems is addressed today, with interval history of each noted here: Hypomagnesemia: she says rx is being limited by diarrhea.  She has stopped going to nephrol for this.  She takes both slow-mag and mag-ox, each tid. Chronic abd pain.  She chronically takes percocet.   Anemia: she denies hematuria.  She takes fe 1/day. Past Medical History  Diagnosis Date  . Obesity   . PUD (peptic ulcer disease)   . Leukopenia   . Short bowel syndrome   . Internal hemorrhoids without mention of complication   . Stricture and stenosis of esophagus   . Osteoarthrosis, unspecified whether generalized or localized, unspecified site   . Thyrotoxicosis without mention of goiter or other cause, without mention of thyrotoxic crisis or storm   . Other and unspecified hyperlipidemia   . Gout, unspecified   . Diverticulosis of colon (without mention of hemorrhage)   . C. difficile colitis   . VITAMIN B12 DEFICIENCY 08/30/2009  . GOITER, MULTINODULAR 04/02/2009  . HYPOTHYROIDISM, POST-RADIATION 08/13/2009  . ASYMPTOMATIC POSTMENOPAUSAL STATUS 10/11/2008  . Esophageal reflux 06/12/2008  . PONV (postoperative nausea and vomiting)   . Varicose veins   . Blood transfusion   . UTI (urinary tract infection)   . Kidney stones     "several"  . Unspecified essential hypertension   . Angina   . Shortness of breath on exertion     "sometimes"  . ANEMIA, IRON DEFICIENCY 05/08/2009  . History of lower GI bleeding   . H/O hiatal hernia   . Migraines   . Breast cancer ([place]) 09/29/11    invasive grade III ductal ca,assoc high grade dcis,ER/PR=neg  . History of radiation therapy 02/08/12-03/25/12    left breast,total 61gy  . Blood transfusion without reported diagnosis   . Hypomagnesemia   . Hypokalemia 05/11/2013  . Gastroparesis   . Type II or unspecified type  diabetes mellitus without mention of complication, not stated as uncontrolled     no med in years diet controled    Past Surgical History  Procedure Laterality Date  . Vein ligation and stripping  1980's    Right leg  . Lithotripsy      "4 or 5 times"  . Bunionectomy  1970's    bilateral  . Colonoscopy  2012    multiple   . Esophagogastroduodenoscopy  2011    multiple   . Thyroid ultrasound  12/1994 and 12/1995  . Mastectomy w/ nodes partial  09/29/11    left  . Cholecystectomy  1990's  . Abdominal hysterectomy  1970's    with BSO  . Dilation and curettage of uterus    . Colon surgery      "several surgeries for short bowel syndrome"  . Abdominal adhesion surgery  1980's thru 1990's    "several"  . Kidney stone surgery  1990's    "tried to go up & get it but pushed it further up"  . Portacath placement  09/29/2011    Procedure: INSERTION PORT-A-CATH;  Surgeon: Adin Hector, MD;  Location: [place];  Service: General;  Laterality: N/A;  . Breast biopsy  08/13/11    left breast lower inner quadrant  . Breast lumpectomy w/ needle localization  09/29/11    left  breast=lymph node,excision benign/ ER/PR=neg, her 2 Positive  .  Flexible sigmoidoscopy  2011    multiple   . Port-a-cath removal Right 12/19/2013    Procedure: MINOR REMOVAL PORT-A-CATH;  Surgeon: Adin Hector, MD;  Location: [place];  Service: General;  Laterality: Right;  . Eye surgery      "long time ago"  . Total knee arthroplasty Right 06/05/2015    Procedure: RIGHT TOTAL KNEE ARTHROPLASTY;  Surgeon: Ninetta Lights, MD;  Location: [place];  Service: Orthopedics;  Laterality: Right;    Social History   Social History  . Marital Status: Married    Spouse Name: N/A  . Number of Children: 2  . Years of Education: N/A   Occupational History  . RETIRED    Social History Main Topics  . Smoking status: Former Smoker -- 1.00 packs/day for 10 years    Types: Cigarettes    Quit date: 09/22/1985    . Smokeless tobacco: Never Used  . Alcohol Use: No     Comment: 09/29/11 "used to drink socially years ago"  . Drug Use: No  . Sexual Activity: No     Comment: HRT x many yrs   Other Topics Concern  . Not on file   Social History Narrative   Pt gets regular exercise    Current Outpatient Prescriptions on File Prior to Visit  Medication Sig Dispense Refill  . aMILoride (MIDAMOR) 5 MG tablet Take 5 mg by mouth 2 (two) times daily.   11  . calcium carbonate (TUMS - DOSED IN MG ELEMENTAL CALCIUM) 500 MG chewable tablet Chew 1 tablet by mouth as needed for indigestion or heartburn.    . Calcium Carbonate-Vitamin D (CALCIUM-VITAMIN D) 600-200 MG-UNIT CAPS Take 1 capsule by mouth daily.      . ciprofloxacin (CIPRO) 500 MG tablet Take 1 tablet (500 mg total) by mouth 2 (two) times daily. 28 tablet 0  . cyanocobalamin (,VITAMIN B-12,) 1000 MCG/ML injection Inject 1,000 mcg into the muscle every 30 (thirty) days.     . diazepam (VALIUM) 2 MG tablet Take 1 tablet (2 mg total) by mouth every 8 (eight) hours as needed for muscle spasms. 20 tablet 0  . dicyclomine (BENTYL) 10 MG capsule Take 1 capsule (10 mg total) by mouth 3 (three) times daily before meals. 90 capsule 11  . ferrous sulfate 325 (65 FE) MG tablet Take 325 mg by mouth daily with breakfast.    . fluorometholone (FML) 0.1 % ophthalmic suspension Place 1 drop into both eyes 2 (two) times daily as needed (Dry Eyes).     . gabapentin (NEURONTIN) 300 MG capsule Take 1 capsule (300 mg total) by mouth 3 (three) times daily. 90 capsule 3  . loperamide (IMODIUM) 2 MG capsule Take 2 capsules (4 mg total) by mouth every 4 (four) hours as needed for diarrhea or loose stools. 30 capsule 0  . lovastatin (MEVACOR) 20 MG tablet TAKE 1 TABLET BY MOUTH DAILY AT 6 PM. 90 tablet 1  . magnesium oxide (MAG-OX) 400 (241.3 MG) MG tablet Take 400 mg by mouth 3 (three) times daily.   11  . ondansetron (ZOFRAN-ODT) 4 MG disintegrating tablet TAKE 1 TABLET BY  MOUTH EVERY 8 HOURS AS NEEDED FOR NAUSEA 30 tablet 2  . pantoprazole (PROTONIX) 40 MG tablet TAKE 1 TABLET 30 MINUTES BEFORE BREAKFAST AND SUPPER 180 tablet 1  . pramoxine-hydrocortisone (ANALPRAM HC) cream Apply topically 2 (two) times daily. (Patient taking differently: Apply 1 application topically 2 (two) times daily as needed. ) 30  g 0  . ranitidine (ZANTAC) 300 MG capsule TAKE ONE CAPSULE BY MOUTH AT BEDTIME 30 capsule 4  . saccharomyces boulardii (FLORASTOR) 250 MG capsule Take 1 capsule (250 mg total) by mouth 2 (two) times daily. 60 capsule 1  . thiamine 100 MG tablet Take 100 mg by mouth daily.      Marland Kitchen zolpidem (AMBIEN) 5 MG tablet Take 1 tablet (5 mg total) by mouth at bedtime as needed. for sleep 30 tablet 5  . calcium gluconate 650 MG tablet Take 1 tablet (650 mg total) by mouth daily. 30 tablet 2   Current Facility-Administered Medications on File Prior to Visit  Medication Dose Route Frequency Provider Last Rate Last Dose  . acetaminophen (TYLENOL) tablet 1,000 mg  1,000 mg Oral Once Amada Kingfisher, MD   1,000 mg at 08/09/13 1658    Allergies  Allergen Reactions  . Aspirin Other (See Comments)    REACTION: Gi Intolerance/ Burning in stomach  . Codeine Itching  . Trazodone And Nefazodone     "sick"  . Flagyl [Metronidazole] Rash  . Iodine Itching    Allergic to IVP dye  . Morphine And Related Itching    Family History  Problem Relation Age of Onset  . Esophageal cancer Son     deceased  . Diabetes Mother   . Heart disease Mother   . Colon cancer Paternal Uncle   . Kidney disease Sister   . Diabetes Father   . Hypertension Father   . Kidney disease Brother     x 3    BP 128/80 mmHg  Pulse 83  Temp(Src) 98 F (36.7 C) (Oral)  Ht 5\' 5"  (1.651 m)  Wt 211 lb (95.709 kg)  BMI 35.11 kg/m2  SpO2 97%  Review of Systems She has intermittent BRBPR and abd cramps.     Objective:   Physical Exam VITAL SIGNS:  See vs page GENERAL: no distress ABDOMEN: abdomen is  soft and nontender, but there is diffuse tenderness and guarding.  no hepatosplenomegaly.  not distended.  no hernia.    Mg++=low TSH=high Vit-D=low Uric A=high Lab Results  Component Value Date   WBC 4.7 10/29/2015   HGB 10.7* 10/29/2015   HCT 32.3* 10/29/2015   MCV 89.9 10/29/2015   PLT 262.0 10/29/2015      Assessment & Plan:  Chronic pain syndrome, persistent Hypomagnesemia: unresponsive to supplementation Probable intermittent SBO: in this context, she should minimize narcotic dosage, and preferably taper off.  Hypothyroidism: she needs increased rx Vit-D deficiency: mild now Hyperuricemia: new Anemia: persistent: Please continue the same fe tabs.  We'll follow Diarrhea, prob due to slow-mag  Patient is advised the following: Patient Instructions  blood tests are requested for you today.  We'll let you know about the results.  Please stop taking the slow-mag.   Please reduce the percocet to 1 pill per day (or you can take 1/2 twice a day, as needed), as this may be contributing to your intestinal problems.  Here is a refill prescription.   blood tests are requested for you today.  We'll let you know about the results. Please come back for a follow-up appointment in 4 months  addendum: take is non-prescription vitamin-D, 2000 units per day. We need to slightly increase the thyroid pill. i have sent a prescription to your pharmacy Also, you should take a pill for the gout. i have sent a prescription to your pharmacy, Renato Shin, MD

## 2015-10-29 NOTE — Patient Instructions (Addendum)
blood tests are requested for you today.  We'll let you know about the results.  Please stop taking the slow-mag.   Please reduce the percocet to 1 pill per day (or you can take 1/2 twice a day, as needed), as this may be contributing to your intestinal problems.  Here is a refill prescription.   blood tests are requested for you today.  We'll let you know about the results. Please come back for a follow-up appointment in 4 months

## 2015-10-30 ENCOUNTER — Telehealth: Payer: Self-pay | Admitting: Endocrinology

## 2015-10-30 NOTE — Telephone Encounter (Signed)
PT requests you call her back she has questions about her medications.

## 2015-10-31 ENCOUNTER — Ambulatory Visit (INDEPENDENT_AMBULATORY_CARE_PROVIDER_SITE_OTHER): Payer: Medicare Other

## 2015-10-31 DIAGNOSIS — E538 Deficiency of other specified B group vitamins: Secondary | ICD-10-CM

## 2015-10-31 MED ORDER — CYANOCOBALAMIN 1000 MCG/ML IJ SOLN
1000.0000 ug | Freq: Once | INTRAMUSCULAR | Status: AC
  Administered 2015-10-31: 1000 ug via INTRAMUSCULAR

## 2015-10-31 NOTE — Telephone Encounter (Signed)
Pt advised of note below and voiced understanding.  

## 2015-10-31 NOTE — Telephone Encounter (Signed)
I contacted the pt. Pt wanted to verify if she should continue to take the vit b 12 injections? Please advise, Thanks!

## 2015-10-31 NOTE — Telephone Encounter (Signed)
Yes, please continue.

## 2015-11-13 ENCOUNTER — Other Ambulatory Visit: Payer: Self-pay | Admitting: *Deleted

## 2015-11-13 NOTE — Patient Outreach (Signed)
[facility]) Care Management  11/13/2015  ZARETH REZNICEK 01-Jan-1946 [id number]   Return telephone call to patient; left message on voice mail requesting call back.  Plan:  Will follow up.   Sherrin Daisy, RN BSN [place] Management Coordinator [facility] Care Management  [phone]

## 2015-11-14 ENCOUNTER — Other Ambulatory Visit: Payer: Self-pay | Admitting: *Deleted

## 2015-11-14 NOTE — Patient Outreach (Signed)
[facility]) Care Management  11/14/2015  [name] 03-30-1946 [id number]   Received incoming call from patient responding to RN CM voice mail.   HIPPA verification received from patient. Patient advised of [facility] care management services. Patient voices that she is currently seeing primary care provider and specialists at regularly scheduled intervals. States she is able to obtain appointments if she is having issues with her health concerns. Voices that diabetes is diet controlled. Last A1c level was 6.0 in May 2017.  States she has no problems obtaining medications and is taking consistently as prescribed by her MD. States no transportation problems.   Patient has good working knowledge of [facility] of her chronic health condition,  Patient voices that she has no case/disease management needs at this time and is declining [facility] services.  Thanked RN CN for call & will accept contact information brochure.  Plan: Close out-send to care management assistant. Send contact information to patient.  Send MD closure letter.   Sherrin Daisy, RN BSN [place] Management Coordinator [facility] Care Management  [phone]

## 2015-11-15 ENCOUNTER — Ambulatory Visit: Payer: Self-pay | Admitting: *Deleted

## 2015-11-15 ENCOUNTER — Encounter: Payer: Self-pay | Admitting: *Deleted

## 2015-11-21 ENCOUNTER — Telehealth: Payer: Self-pay | Admitting: Gastroenterology

## 2015-11-21 ENCOUNTER — Telehealth: Payer: Self-pay | Admitting: Endocrinology

## 2015-11-21 NOTE — Telephone Encounter (Signed)
PT called and said she is in terrible pain and needed to talk to you about what to do.

## 2015-11-21 NOTE — Telephone Encounter (Signed)
Patient c/o severe abdominal cramping onset yesterday night. No loose stools but has had several formed bowel movements today. Tried to take Bentyl with no relief of symptoms. Please advise if their is something else she can take.

## 2015-11-21 NOTE — Telephone Encounter (Signed)
Increase Bentyl to 20 mg po tid prn Clear liquid diet for today, advance as tolerated

## 2015-11-21 NOTE — Telephone Encounter (Signed)
I contacted the pt. Pt stated she has been having severe stomach cramping today. Pt stated she has not been running a fever and she has had a BM. Pt was advised Dr. Loanne Drilling is out of town and will not return until 12/02/2015. Pt was advised to seek care at an urgent care or at the [facility] primary care on [address]. Pt voiced understanding.

## 2015-11-21 NOTE — Telephone Encounter (Signed)
Patient informed of treatment plan and she verbalized understanding. She states she has plenty of Bentyl and should not need any refill at this time. Will call back if not feeling better.

## 2015-11-25 DIAGNOSIS — M47812 Spondylosis without myelopathy or radiculopathy, cervical region: Secondary | ICD-10-CM | POA: Diagnosis not present

## 2015-11-25 DIAGNOSIS — M7581 Other shoulder lesions, right shoulder: Secondary | ICD-10-CM | POA: Diagnosis not present

## 2015-12-01 ENCOUNTER — Telehealth: Payer: Self-pay | Admitting: Endocrinology

## 2015-12-01 NOTE — Telephone Encounter (Signed)
please call patient: i have received form about you being housebound. What condition causes you to be housebound, and how so?

## 2015-12-02 NOTE — Telephone Encounter (Signed)
Attempted to reach the pt. Was unavailable. Will try again at a later time.

## 2015-12-04 ENCOUNTER — Ambulatory Visit (INDEPENDENT_AMBULATORY_CARE_PROVIDER_SITE_OTHER): Payer: Medicare Other

## 2015-12-04 DIAGNOSIS — E538 Deficiency of other specified B group vitamins: Secondary | ICD-10-CM | POA: Diagnosis not present

## 2015-12-04 MED ORDER — CYANOCOBALAMIN 1000 MCG/ML IJ SOLN
1000.0000 ug | Freq: Once | INTRAMUSCULAR | Status: AC
  Administered 2015-12-04: 1000 ug via INTRAMUSCULAR

## 2015-12-04 NOTE — Telephone Encounter (Signed)
Medicare won't accept this as a reason. If there is any other reason, please let me know

## 2015-12-04 NOTE — Telephone Encounter (Signed)
Pt came in for a B12 injection today and advised the condition is the chronic diarrhea she has been experiencing.

## 2015-12-05 NOTE — Telephone Encounter (Signed)
I contacted the pt and advised Medicare would not accept chronic diarrhea as a reason on the forms to be completed. Pt stated the forms were not for medicare but would be submitted to the [place]. Please advise, Thanks!

## 2015-12-05 NOTE — Telephone Encounter (Signed)
Thank you.  However, it still would not be accepted.

## 2015-12-05 NOTE — Telephone Encounter (Signed)
Requested a call back from the pt.  

## 2015-12-06 DIAGNOSIS — M25561 Pain in right knee: Secondary | ICD-10-CM | POA: Diagnosis not present

## 2015-12-06 NOTE — Telephone Encounter (Signed)
I contacted the pt and advised of Md note below. Pt voiced understanding,

## 2015-12-06 NOTE — Telephone Encounter (Signed)
i have checked, and I can't find anything

## 2015-12-06 NOTE — Telephone Encounter (Signed)
I contacted the pt and advised of note below. Pt stated she could not think of any other diagnosis. Pt did ask if you could review her chart and see if you could locate a diagnosis that could be put on form? Please advise, Thanks!

## 2015-12-11 ENCOUNTER — Telehealth: Payer: Self-pay | Admitting: Endocrinology

## 2015-12-11 ENCOUNTER — Ambulatory Visit (INDEPENDENT_AMBULATORY_CARE_PROVIDER_SITE_OTHER): Payer: Medicare Other | Admitting: Endocrinology

## 2015-12-11 ENCOUNTER — Telehealth: Payer: Self-pay

## 2015-12-11 VITALS — BP 112/70 | HR 89 | Wt 219.0 lb

## 2015-12-11 DIAGNOSIS — M109 Gout, unspecified: Secondary | ICD-10-CM | POA: Diagnosis not present

## 2015-12-11 DIAGNOSIS — E890 Postprocedural hypothyroidism: Secondary | ICD-10-CM | POA: Diagnosis not present

## 2015-12-11 LAB — URIC ACID: Uric Acid, Serum: 6.5 mg/dL (ref 2.4–7.0)

## 2015-12-11 LAB — TSH: TSH: 3.56 u[IU]/mL (ref 0.35–4.50)

## 2015-12-11 MED ORDER — PRAMOXINE-HC 1-1 % EX CREA: 1.0000 "application " | TOPICAL_CREAM | Freq: Two times a day (BID) | CUTANEOUS | Status: DC | PRN

## 2015-12-11 NOTE — Patient Instructions (Signed)
blood tests are requested for you today.  We'll let you know about the results. I'll see you next time.

## 2015-12-11 NOTE — Telephone Encounter (Signed)
Forms placed by on your desk.

## 2015-12-11 NOTE — Telephone Encounter (Signed)
Called to notify patient of normal lab results. Patient had no questions or concerns.

## 2015-12-11 NOTE — Telephone Encounter (Signed)
i need homebound form i put in scanning pile, thanks.

## 2015-12-11 NOTE — Telephone Encounter (Signed)
This is a progress note from orthopedics. What I need is the form for homebound status I had put in scanning pile.  Thank you.

## 2015-12-11 NOTE — Progress Notes (Signed)
Subjective:    Patient ID: [name], female    DOB: 28-Oct-1945, 70 y.o.   MRN: [id number]  HPI Pt states many years of intermittent abd pain, and assoc intermittent diarrhea.   Past Medical History  Diagnosis Date  . Obesity   . PUD (peptic ulcer disease)   . Leukopenia   . Short bowel syndrome   . Internal hemorrhoids without mention of complication   . Stricture and stenosis of esophagus   . Osteoarthrosis, unspecified whether generalized or localized, unspecified site   . Thyrotoxicosis without mention of goiter or other cause, without mention of thyrotoxic crisis or storm   . Other and unspecified hyperlipidemia   . Gout, unspecified   . Diverticulosis of colon (without mention of hemorrhage)   . C. difficile colitis   . VITAMIN B12 DEFICIENCY 08/30/2009  . GOITER, MULTINODULAR 04/02/2009  . HYPOTHYROIDISM, POST-RADIATION 08/13/2009  . ASYMPTOMATIC POSTMENOPAUSAL STATUS 10/11/2008  . Esophageal reflux 06/12/2008  . PONV (postoperative nausea and vomiting)   . Varicose veins   . Blood transfusion   . UTI (urinary tract infection)   . Kidney stones     "several"  . Unspecified essential hypertension   . Angina   . Shortness of breath on exertion     "sometimes"  . ANEMIA, IRON DEFICIENCY 05/08/2009  . History of lower GI bleeding   . H/O hiatal hernia   . Migraines   . Breast cancer ([place]) 09/29/11    invasive grade III ductal ca,assoc high grade dcis,ER/PR=neg  . History of radiation therapy 02/08/12-03/25/12    left breast,total 61gy  . Blood transfusion without reported diagnosis   . Hypomagnesemia   . Hypokalemia 05/11/2013  . Gastroparesis   . Type II or unspecified type diabetes mellitus without mention of complication, not stated as uncontrolled     no med in years diet controled    Past Surgical History  Procedure Laterality Date  . Vein ligation and stripping  1980's    Right leg  . Lithotripsy      "4 or 5 times"  . Bunionectomy  1970's   bilateral  . Colonoscopy  2012    multiple   . Esophagogastroduodenoscopy  2011    multiple   . Thyroid ultrasound  12/1994 and 12/1995  . Mastectomy w/ nodes partial  09/29/11    left  . Cholecystectomy  1990's  . Abdominal hysterectomy  1970's    with BSO  . Dilation and curettage of uterus    . Colon surgery      "several surgeries for short bowel syndrome"  . Abdominal adhesion surgery  1980's thru 1990's    "several"  . Kidney stone surgery  1990's    "tried to go up & get it but pushed it further up"  . Portacath placement  09/29/2011    Procedure: INSERTION PORT-A-CATH;  Surgeon: Adin Hector, MD;  Location: [place];  Service: General;  Laterality: N/A;  . Breast biopsy  08/13/11    left breast lower inner quadrant  . Breast lumpectomy w/ needle localization  09/29/11    left  breast=lymph node,excision benign/ ER/PR=neg, her 2 Positive  . Flexible sigmoidoscopy  2011    multiple   . Port-a-cath removal Right 12/19/2013    Procedure: MINOR REMOVAL PORT-A-CATH;  Surgeon: Adin Hector, MD;  Location: [place];  Service: General;  Laterality: Right;  . Eye surgery      "long time ago"  .  Total knee arthroplasty Right 06/05/2015    Procedure: RIGHT TOTAL KNEE ARTHROPLASTY;  Surgeon: Ninetta Lights, MD;  Location: [place];  Service: Orthopedics;  Laterality: Right;    Social History   Social History  . Marital Status: Married    Spouse Name: N/A  . Number of Children: 2  . Years of Education: N/A   Occupational History  . RETIRED    Social History Main Topics  . Smoking status: Former Smoker -- 1.00 packs/day for 10 years    Types: Cigarettes    Quit date: 09/22/1985  . Smokeless tobacco: Never Used  . Alcohol Use: No     Comment: 09/29/11 "used to drink socially years ago"  . Drug Use: No  . Sexual Activity: No     Comment: HRT x many yrs   Other Topics Concern  . Not on file   Social History Narrative   Pt gets regular exercise    Current  Outpatient Prescriptions on File Prior to Visit  Medication Sig Dispense Refill  . allopurinol (ZYLOPRIM) 100 MG tablet Take 1 tablet (100 mg total) by mouth daily. 30 tablet 11  . aMILoride (MIDAMOR) 5 MG tablet Take 5 mg by mouth 2 (two) times daily.   11  . calcium carbonate (TUMS - DOSED IN MG ELEMENTAL CALCIUM) 500 MG chewable tablet Chew 1 tablet by mouth as needed for indigestion or heartburn.    . Calcium Carbonate-Vitamin D (CALCIUM-VITAMIN D) 600-200 MG-UNIT CAPS Take 1 capsule by mouth daily.      . cyanocobalamin (,VITAMIN B-12,) 1000 MCG/ML injection Inject 1,000 mcg into the muscle every 30 (thirty) days.     . diazepam (VALIUM) 2 MG tablet Take 1 tablet (2 mg total) by mouth every 8 (eight) hours as needed for muscle spasms. 20 tablet 0  . dicyclomine (BENTYL) 10 MG capsule Take 1 capsule (10 mg total) by mouth 3 (three) times daily before meals. 90 capsule 11  . ferrous sulfate 325 (65 FE) MG tablet Take 325 mg by mouth daily with breakfast.    . fluorometholone (FML) 0.1 % ophthalmic suspension Place 1 drop into both eyes 2 (two) times daily as needed (Dry Eyes).     . gabapentin (NEURONTIN) 300 MG capsule Take 1 capsule (300 mg total) by mouth 3 (three) times daily. 90 capsule 3  . levothyroxine (SYNTHROID, LEVOTHROID) 175 MCG tablet Take 1 tablet (175 mcg total) by mouth daily before breakfast. 30 tablet 11  . loperamide (IMODIUM) 2 MG capsule Take 2 capsules (4 mg total) by mouth every 4 (four) hours as needed for diarrhea or loose stools. 30 capsule 0  . lovastatin (MEVACOR) 20 MG tablet TAKE 1 TABLET BY MOUTH DAILY AT 6 PM. 90 tablet 1  . magnesium oxide (MAG-OX) 400 (241.3 MG) MG tablet Take 400 mg by mouth 3 (three) times daily.   11  . ondansetron (ZOFRAN-ODT) 4 MG disintegrating tablet TAKE 1 TABLET BY MOUTH EVERY 8 HOURS AS NEEDED FOR NAUSEA 30 tablet 2  . oxyCODONE-acetaminophen (PERCOCET) 5-325 MG tablet Take 1-2 tablets by mouth every 4 (four) hours as needed for severe  pain. 30 tablet 0  . pantoprazole (PROTONIX) 40 MG tablet TAKE 1 TABLET 30 MINUTES BEFORE BREAKFAST AND SUPPER 180 tablet 1  . ranitidine (ZANTAC) 300 MG capsule TAKE ONE CAPSULE BY MOUTH AT BEDTIME 30 capsule 4  . saccharomyces boulardii (FLORASTOR) 250 MG capsule Take 1 capsule (250 mg total) by mouth 2 (two) times daily. 60 capsule  1  . thiamine 100 MG tablet Take 100 mg by mouth daily.      Marland Kitchen zolpidem (AMBIEN) 5 MG tablet Take 1 tablet (5 mg total) by mouth at bedtime as needed. for sleep 30 tablet 5  . calcium gluconate 650 MG tablet Take 1 tablet (650 mg total) by mouth daily. 30 tablet 2   Current Facility-Administered Medications on File Prior to Visit  Medication Dose Route Frequency Provider Last Rate Last Dose  . acetaminophen (TYLENOL) tablet 1,000 mg  1,000 mg Oral Once Amada Kingfisher, MD   1,000 mg at 08/09/13 1658    Allergies  Allergen Reactions  . Aspirin Other (See Comments)    REACTION: Gi Intolerance/ Burning in stomach  . Codeine Itching  . Trazodone And Nefazodone     "sick"  . Flagyl [Metronidazole] Rash  . Iodine Itching    Allergic to IVP dye  . Morphine And Related Itching    Family History  Problem Relation Age of Onset  . Esophageal cancer Son     deceased  . Diabetes Mother   . Heart disease Mother   . Colon cancer Paternal Uncle   . Kidney disease Sister   . Diabetes Father   . Hypertension Father   . Kidney disease Brother     x 3    BP 112/70 mmHg  Pulse 89  Wt 219 lb (99.338 kg)  SpO2 95%  Review of Systems She also has intermittent nausea.  No recent gout sxs.     Objective:   Physical Exam VITAL SIGNS:  See vs page GENERAL: no distress NECK: There is no palpable thyroid enlargement.  No thyroid nodule is palpable.  No palpable lymphadenopathy at the anterior neck.  Lab Results  Component Value Date   TSH 3.56 12/11/2015   T4TOTAL 8.1 07/29/2007   Lab Results  Component Value Date   LABURIC 6.5 12/11/2015      Assessment &  Plan:  Hypothyroidism: well-replaced.  Please continue the same medication Gout: well-controlled.  Please continue the same medication IBS, chronic stable.  I told pt this does not cause her to be disabled or homebound.    Patient is advised the following: Patient Instructions  blood tests are requested for you today.  We'll let you know about the results. I'll see you next time.    Renato Shin, MD

## 2015-12-12 NOTE — Telephone Encounter (Signed)
[name] and myself were unable to find the form. Form has already been sent to scanning. Please advise on how to proceed.  Thanks!

## 2015-12-12 NOTE — Telephone Encounter (Signed)
Please ask pt if she can obtain another form

## 2015-12-19 ENCOUNTER — Other Ambulatory Visit: Payer: Self-pay

## 2015-12-19 DIAGNOSIS — D509 Iron deficiency anemia, unspecified: Secondary | ICD-10-CM

## 2015-12-19 DIAGNOSIS — E559 Vitamin D deficiency, unspecified: Secondary | ICD-10-CM

## 2015-12-19 DIAGNOSIS — E119 Type 2 diabetes mellitus without complications: Secondary | ICD-10-CM

## 2015-12-19 DIAGNOSIS — M109 Gout, unspecified: Secondary | ICD-10-CM

## 2015-12-19 DIAGNOSIS — E785 Hyperlipidemia, unspecified: Secondary | ICD-10-CM

## 2015-12-19 MED ORDER — GABAPENTIN 300 MG PO CAPS: 300.0000 mg | ORAL_CAPSULE | Freq: Three times a day (TID) | ORAL | Status: DC

## 2015-12-19 MED ORDER — LEVOTHYROXINE SODIUM 175 MCG PO TABS: 175.0000 ug | ORAL_TABLET | Freq: Every day | ORAL | Status: DC

## 2015-12-19 MED ORDER — ALLOPURINOL 100 MG PO TABS
100.0000 mg | ORAL_TABLET | Freq: Every day | ORAL | Status: DC
Start: 2015-12-19 — End: 2016-05-06

## 2015-12-20 NOTE — Telephone Encounter (Signed)
Pt's advised per Mardene Celeste to please obtain a new form. Pt stated she would and bring it to the office.

## 2015-12-23 ENCOUNTER — Other Ambulatory Visit: Payer: Self-pay

## 2015-12-23 ENCOUNTER — Telehealth: Payer: Self-pay | Admitting: Endocrinology

## 2015-12-23 NOTE — Telephone Encounter (Signed)
PT stated she was returning phone call about some lab tests? Was not sure what it was about.

## 2015-12-25 NOTE — Telephone Encounter (Signed)
SPOKE WITH PT.

## 2015-12-25 NOTE — Telephone Encounter (Signed)
lmtrc

## 2015-12-25 NOTE — Telephone Encounter (Signed)
Patient is retuning your call 

## 2016-01-01 ENCOUNTER — Other Ambulatory Visit: Payer: Self-pay

## 2016-01-01 MED ORDER — PRAMOXINE-HC 1-1 % EX CREA: 1.0000 "application " | TOPICAL_CREAM | Freq: Two times a day (BID) | CUTANEOUS | 2 refills | Status: DC | PRN

## 2016-01-03 ENCOUNTER — Telehealth: Payer: Self-pay | Admitting: Endocrinology

## 2016-01-03 NOTE — Telephone Encounter (Signed)
PT needs refill on [facility] also requests something for pain.

## 2016-01-03 NOTE — Telephone Encounter (Signed)
Called patient back to inform her that we had no doctors in the office this afternoon to sign off for her medications for suppositories or to sign for oxycodone. I advised patient we could try and get them done on Monday when we have doctors in the office, but as of today there was nothing we could do at this time to sign for the pain medication. Patient was not happy, I apologized multiple times and stated we could try and get them completed on Monday. No questions or concerns patient just stated she was not happy.

## 2016-01-05 ENCOUNTER — Other Ambulatory Visit: Payer: Self-pay | Admitting: Endocrinology

## 2016-01-05 NOTE — Telephone Encounter (Signed)
Please ask another dr to refill x 1 in my absence, thank you

## 2016-01-06 ENCOUNTER — Other Ambulatory Visit: Payer: Self-pay

## 2016-01-06 ENCOUNTER — Telehealth: Payer: Self-pay

## 2016-01-06 DIAGNOSIS — E559 Vitamin D deficiency, unspecified: Secondary | ICD-10-CM

## 2016-01-06 DIAGNOSIS — M109 Gout, unspecified: Secondary | ICD-10-CM

## 2016-01-06 DIAGNOSIS — D509 Iron deficiency anemia, unspecified: Secondary | ICD-10-CM

## 2016-01-06 DIAGNOSIS — E785 Hyperlipidemia, unspecified: Secondary | ICD-10-CM

## 2016-01-06 DIAGNOSIS — E119 Type 2 diabetes mellitus without complications: Secondary | ICD-10-CM

## 2016-01-06 MED ORDER — LOVASTATIN 20 MG PO TABS: ORAL_TABLET | ORAL | 1 refills | Status: DC

## 2016-01-06 MED ORDER — OXYCODONE-ACETAMINOPHEN 5-325 MG PO TABS: 1.0000 | ORAL_TABLET | ORAL | 0 refills | Status: DC | PRN

## 2016-01-06 NOTE — Telephone Encounter (Signed)
Patient pain medication printed to be signed by Dr.Gherghe and then to be picked up by patient.

## 2016-01-06 NOTE — Telephone Encounter (Signed)
Ok

## 2016-01-06 NOTE — Telephone Encounter (Signed)
See below. Would you be willing to refill the Oxycodone during Dr. Cordelia Pen absence? Thanks!

## 2016-01-06 NOTE — Telephone Encounter (Signed)
I contacted the pt and advised we have her Oxycodone is ready for pick up. Rx placed upfront and pt had no other questions at this time.

## 2016-01-07 ENCOUNTER — Ambulatory Visit: Payer: Medicare Other

## 2016-01-10 ENCOUNTER — Other Ambulatory Visit: Payer: Self-pay | Admitting: Gastroenterology

## 2016-01-16 ENCOUNTER — Telehealth: Payer: Self-pay | Admitting: Gastroenterology

## 2016-01-16 ENCOUNTER — Ambulatory Visit (INDEPENDENT_AMBULATORY_CARE_PROVIDER_SITE_OTHER): Payer: Medicare Other | Admitting: Gastroenterology

## 2016-01-16 ENCOUNTER — Encounter: Payer: Self-pay | Admitting: Gastroenterology

## 2016-01-16 ENCOUNTER — Encounter (INDEPENDENT_AMBULATORY_CARE_PROVIDER_SITE_OTHER): Payer: Self-pay

## 2016-01-16 VITALS — BP 110/78 | HR 72 | Ht 65.0 in | Wt 216.0 lb

## 2016-01-16 DIAGNOSIS — K2100 Gastro-esophageal reflux disease with esophagitis, without bleeding: Secondary | ICD-10-CM

## 2016-01-16 DIAGNOSIS — K3184 Gastroparesis: Secondary | ICD-10-CM

## 2016-01-16 MED ORDER — HYDROCORTISONE ACETATE 25 MG RE SUPP: 25.0000 mg | Freq: Two times a day (BID) | RECTAL | 1 refills | Status: DC

## 2016-01-16 MED ORDER — METOCLOPRAMIDE HCL 5 MG PO TABS: 5.0000 mg | ORAL_TABLET | Freq: Three times a day (TID) | ORAL | 2 refills | Status: DC

## 2016-01-16 MED ORDER — PRAMOXINE-HC 1-1 % EX CREA: 1.0000 "application " | TOPICAL_CREAM | Freq: Two times a day (BID) | CUTANEOUS | 2 refills | Status: DC | PRN

## 2016-01-16 NOTE — Progress Notes (Signed)
    History of Present Illness: This is a 70 year old female who was previously followed by Dr. Sharlett Iles. She has a history of GERD/peptic strictures, gastroparesis, C. difficile, chronic abdominal pain, diverticular disease and chronic diarrhea. She has had multiple abdominal surgeries including hysterectomy, bilateral salpingo-oophorectomy and cholecystectomy. She has had multiple small bowel obstructions and lysis of adhesions. She has been felt to have small bowel bacterial overgrowth and possibly bile salt diarrhea in the past. She has chronic left flank pain and mild lower abdominal pain. EGD performed in April 2015 showed LA Class Grade A esophagitis and a small hiatal hernia. Dilation was performed for dysphagia symptoms without a stricture noted. Gastric emptying scan in April 2015 revealed 71% retention at 120 minutes. She relates worsening reflux symptoms primarily with regurgitation in the evenings and at night time for about 2 weeks. She has not had any medication changes or changes in diet.   Current Medications, Allergies, Past Medical History, Past Surgical History, Family History and Social History were reviewed in [employer] record.  Physical Exam: General: Well developed, well nourished, no acute distress Head: Normocephalic and atraumatic Eyes:  sclerae anicteric, EOMI Ears: Normal auditory acuity Mouth: No deformity or lesions Lungs: Clear throughout to auscultation Heart: Regular rate and rhythm; no murmurs, rubs or bruits Abdomen: Soft, non tender and non distended. No masses, hepatosplenomegaly or hernias noted. Normal Bowel sounds Musculoskeletal: Symmetrical with no gross deformities  Pulses:  Normal pulses noted Extremities: No clubbing, cyanosis, edema or deformities noted Neurological: Alert oriented x 4, grossly nonfocal Psychological:  Alert and cooperative. Normal mood and affect  Assessment and Recommendations:  1. GERD and  gastroparesis. Intensify all antireflux measures and strictly follow a gastroparesis diet. Begin metoclopramide 5 mg before meals and at bedtime. Continue pantoprazole 40 mg twice a day before breakfast and dinner and ranitidine 300 mg at bedtime. REV in 2 months.

## 2016-01-16 NOTE — Patient Instructions (Signed)
We have sent the following medications to your pharmacy for you to pick up at your convenience:reglan and analpram.   You have been given a gastroparesis diet.   Patient advised to avoid spicy, acidic, citrus, chocolate, mints, fruit and fruit juices.  Limit the intake of caffeine, alcohol and Soda.  Don't exercise too soon after eating.  Don't lie down within 3-4 hours of eating.  Elevate the head of your bed.  Normal BMI (Body Mass Index- based on height and weight) is between 23 and 30. Your BMI today is Body mass index is 35.94 kg/m. Marland Kitchen Please consider follow up  regarding your BMI with your Primary Care Provider.  Thank you for choosing me and [facility].  Pricilla Riffle. Dagoberto Ligas., MD., Marval Regal

## 2016-01-16 NOTE — Telephone Encounter (Signed)
Patient states the medication she wanted called into the pharmacy was hydrocortisone suppository she has been prescribed in the past. Informed patient that [facility] Patient verbalized understanding.

## 2016-01-29 ENCOUNTER — Telehealth: Payer: Self-pay | Admitting: Endocrinology

## 2016-01-29 ENCOUNTER — Other Ambulatory Visit: Payer: Self-pay | Admitting: Endocrinology

## 2016-01-29 MED ORDER — ZOLPIDEM TARTRATE 5 MG PO TABS: 5.0000 mg | ORAL_TABLET | Freq: Every evening | ORAL | 5 refills | Status: DC | PRN

## 2016-01-29 NOTE — Telephone Encounter (Signed)
I printed  

## 2016-01-29 NOTE — Telephone Encounter (Signed)
See message.

## 2016-01-29 NOTE — Telephone Encounter (Signed)
I contacted the patient and advised we have submitted a prescription for Ambien. Patient voiced understanding.

## 2016-01-29 NOTE — Telephone Encounter (Signed)
Patient Husband passed a few days ago of a massive heart attack. His funeral is Saturday.  Patient asked if she could get something to help her sleep. please

## 2016-02-10 ENCOUNTER — Encounter: Payer: Self-pay | Admitting: Endocrinology

## 2016-02-10 ENCOUNTER — Ambulatory Visit (INDEPENDENT_AMBULATORY_CARE_PROVIDER_SITE_OTHER): Payer: Medicare Other | Admitting: Endocrinology

## 2016-02-10 VITALS — BP 132/86 | HR 82 | Ht 65.0 in | Wt 213.0 lb

## 2016-02-10 DIAGNOSIS — E538 Deficiency of other specified B group vitamins: Secondary | ICD-10-CM | POA: Diagnosis not present

## 2016-02-10 MED ORDER — CYANOCOBALAMIN 1000 MCG/ML IJ SOLN
1000.0000 ug | Freq: Once | INTRAMUSCULAR | Status: AC
  Administered 2016-02-10: 1000 ug via INTRAMUSCULAR

## 2016-02-10 MED ORDER — GABAPENTIN 300 MG PO CAPS: 600.0000 mg | ORAL_CAPSULE | Freq: Two times a day (BID) | ORAL | 11 refills | Status: DC

## 2016-02-10 NOTE — Patient Instructions (Addendum)
I have sent a prescription to your pharmacy, to increase the gabapentin.  Please come back for a regular physical appointment in 2 months.   Please let me know if you decide to see a specialist for your right hand.

## 2016-02-10 NOTE — Progress Notes (Signed)
Subjective:    Patient ID: [name], female    DOB: Apr 11, 1946, 70 y.o.   MRN: [id number]  HPI Pt states 1 week of moderate pain at the right hand, and slight assoc swelling.  Past Medical History:  Diagnosis Date  . ANEMIA, IRON DEFICIENCY 05/08/2009  . Angina   . ASYMPTOMATIC POSTMENOPAUSAL STATUS 10/11/2008  . Blood transfusion   . Blood transfusion without reported diagnosis   . Breast cancer ([place]) 09/29/11   invasive grade III ductal ca,assoc high grade dcis,ER/PR=neg  . C. difficile colitis   . Diverticulosis of colon (without mention of hemorrhage)   . Esophageal reflux 06/12/2008  . Gastroparesis   . GOITER, MULTINODULAR 04/02/2009  . Gout, unspecified   . H/O hiatal hernia   . History of lower GI bleeding   . History of radiation therapy 02/08/12-03/25/12   left breast,total 61gy  . Hypokalemia 05/11/2013  . Hypomagnesemia   . HYPOTHYROIDISM, POST-RADIATION 08/13/2009  . Internal hemorrhoids without mention of complication   . Kidney stones    "several"  . Leukopenia   . Migraines   . Obesity   . Osteoarthrosis, unspecified whether generalized or localized, unspecified site   . Other and unspecified hyperlipidemia   . PONV (postoperative nausea and vomiting)   . PUD (peptic ulcer disease)   . Short bowel syndrome   . Shortness of breath on exertion    "sometimes"  . Stricture and stenosis of esophagus   . Thyrotoxicosis without mention of goiter or other cause, without mention of thyrotoxic crisis or storm   . Type II or unspecified type diabetes mellitus without mention of complication, not stated as uncontrolled    no med in years diet controled  . Unspecified essential hypertension   . UTI (urinary tract infection)   . Varicose veins   . VITAMIN B12 DEFICIENCY 08/30/2009    Past Surgical History:  Procedure Laterality Date  . ABDOMINAL ADHESION SURGERY  1980's thru 1990's   "several"  . ABDOMINAL HYSTERECTOMY  1970's   with BSO  . BREAST BIOPSY   08/13/11   left breast lower inner quadrant  . BREAST LUMPECTOMY W/ NEEDLE LOCALIZATION  09/29/11   left  breast=lymph node,excision benign/ ER/PR=neg, her 2 Positive  . BUNIONECTOMY  1970's   bilateral  . CHOLECYSTECTOMY  1990's  . COLON SURGERY     "several surgeries for short bowel syndrome"  . COLONOSCOPY  2012   multiple   . DILATION AND CURETTAGE OF UTERUS    . ESOPHAGOGASTRODUODENOSCOPY  2011   multiple   . EYE SURGERY     "long time ago"  . FLEXIBLE SIGMOIDOSCOPY  2011   multiple   . KIDNEY STONE SURGERY  1990's   "tried to go up & get it but pushed it further up"  . LITHOTRIPSY     "4 or 5 times"  . MASTECTOMY W/ NODES PARTIAL  09/29/11   left  . PORT-A-CATH REMOVAL Right 12/19/2013   Procedure: MINOR REMOVAL PORT-A-CATH;  Surgeon: Adin Hector, MD;  Location: [facility];  Service: General;  Laterality: Right;  . PORTACATH PLACEMENT  09/29/2011   Procedure: INSERTION PORT-A-CATH;  Surgeon: Adin Hector, MD;  Location: [place];  Service: General;  Laterality: N/A;  . Thyroid Ultrasound  12/1994 and 12/1995  . TOTAL KNEE ARTHROPLASTY Right 06/05/2015   Procedure: RIGHT TOTAL KNEE ARTHROPLASTY;  Surgeon: Ninetta Lights, MD;  Location: [facility];  Service: Orthopedics;  Laterality: Right;  .  VEIN LIGATION AND STRIPPING  1980's   Right leg    Social History   Social History  . Marital status: Married    Spouse name: N/A  . Number of children: 2  . Years of education: N/A   Occupational History  . RETIRED    Social History Main Topics  . Smoking status: Former Smoker    Packs/day: 1.00    Years: 10.00    Types: Cigarettes    Quit date: 09/22/1985  . Smokeless tobacco: Never Used  . Alcohol use No     Comment: 09/29/11 "used to drink socially years ago"  . Drug use: No  . Sexual activity: No     Comment: HRT x many yrs   Other Topics Concern  . Not on file   Social History Narrative   Pt gets regular exercise    Current Outpatient  Prescriptions on File Prior to Visit  Medication Sig Dispense Refill  . allopurinol (ZYLOPRIM) 100 MG tablet Take 1 tablet (100 mg total) by mouth daily. 90 tablet 1  . aMILoride (MIDAMOR) 5 MG tablet Take 5 mg by mouth 2 (two) times daily.   11  . calcium carbonate (TUMS - DOSED IN MG ELEMENTAL CALCIUM) 500 MG chewable tablet Chew 1 tablet by mouth as needed for indigestion or heartburn.    . Calcium Carbonate-Vitamin D (CALCIUM-VITAMIN D) 600-200 MG-UNIT CAPS Take 1 capsule by mouth daily.      . cyanocobalamin (,VITAMIN B-12,) 1000 MCG/ML injection Inject 1,000 mcg into the muscle every 30 (thirty) days.     Marland Kitchen dicyclomine (BENTYL) 10 MG capsule Take 1 capsule (10 mg total) by mouth 3 (three) times daily before meals. 90 capsule 11  . ferrous sulfate 325 (65 FE) MG tablet Take 325 mg by mouth daily with breakfast.    . fluorometholone (FML) 0.1 % ophthalmic suspension Place 1 drop into both eyes 2 (two) times daily as needed (Dry Eyes).     . hydrocortisone (ANUSOL-HC) 25 MG suppository Place 1 suppository (25 mg total) rectally every 12 (twelve) hours. 12 suppository 1  . levothyroxine (SYNTHROID, LEVOTHROID) 175 MCG tablet Take 1 tablet (175 mcg total) by mouth daily before breakfast. 90 tablet 1  . loperamide (IMODIUM) 2 MG capsule Take 2 capsules (4 mg total) by mouth every 4 (four) hours as needed for diarrhea or loose stools. 30 capsule 0  . lovastatin (MEVACOR) 20 MG tablet TAKE 1 TABLET BY MOUTH DAILY AT 6 PM. 90 tablet 1  . ondansetron (ZOFRAN-ODT) 4 MG disintegrating tablet TAKE 1 TABLET BY MOUTH EVERY 8 HOURS AS NEEDED FOR NAUSEA 30 tablet 2  . pantoprazole (PROTONIX) 40 MG tablet TAKE 1 TABLET 30 MINUTES BEFORE BREAKFAST AND SUPPER 180 tablet 1  . pramoxine-hydrocortisone (ANALPRAM HC) cream Apply 1 application topically 2 (two) times daily as needed. 90 g 2  . ranitidine (ZANTAC) 300 MG tablet TAKE 1 TABLET BY MOUTH AT BEDTIME 30 tablet 2  . saccharomyces boulardii (FLORASTOR) 250 MG  capsule Take 1 capsule (250 mg total) by mouth 2 (two) times daily. 60 capsule 1  . thiamine 100 MG tablet Take 100 mg by mouth daily.      . calcium gluconate 650 MG tablet Take 1 tablet (650 mg total) by mouth daily. 30 tablet 2   Current Facility-Administered Medications on File Prior to Visit  Medication Dose Route Frequency Provider Last Rate Last Dose  . acetaminophen (TYLENOL) tablet 1,000 mg  1,000 mg Oral Once Amada Kingfisher,  MD        Allergies  Allergen Reactions  . Aspirin Other (See Comments)    REACTION: Gi Intolerance/ Burning in stomach  . Codeine Itching  . Trazodone And Nefazodone     "sick"  . Flagyl [Metronidazole] Rash  . Iodine Itching    Allergic to IVP dye  . Morphine And Related Itching    Family History  Problem Relation Age of Onset  . Esophageal cancer Son     deceased  . Diabetes Mother   . Heart disease Mother   . Kidney disease Sister   . Diabetes Father   . Hypertension Father   . Kidney disease Brother     x 3  . Colon cancer Paternal Uncle     BP 132/86   Pulse 82   Ht 5\' 5"  (1.651 m)   Wt 213 lb (96.6 kg)   SpO2 99%   BMI 35.45 kg/m    Review of Systems Denies hand numbness.  Diarrhea and abd pain intermittently persist.     Objective:   Physical Exam VITAL SIGNS:  See vs page GENERAL: no distress Pulses: right radial is intact.   MSK: no deformity of the right hand.  No swell/warmth/erythema Skin:  normal color and temp on the right hand.  Old healed surgical scar on the flexor aspect of the right wrist. Neuro: sensation is intact to touch on the right hand.         Assessment & Plan:  Diarrhea, due to short bowel syndrome: I told her this does not kame her homebound.  She has a Optician, dispensing.  chronic abdominal pain syndrome: we discussed the need to transition from narcotics to gabapentin.   Hand pain, new, uncertain etiology.

## 2016-02-19 ENCOUNTER — Other Ambulatory Visit: Payer: Self-pay | Admitting: Endocrinology

## 2016-02-26 DIAGNOSIS — E119 Type 2 diabetes mellitus without complications: Secondary | ICD-10-CM | POA: Diagnosis not present

## 2016-02-26 LAB — HM DIABETES EYE EXAM

## 2016-03-02 ENCOUNTER — Encounter: Payer: Self-pay | Admitting: Endocrinology

## 2016-03-09 ENCOUNTER — Ambulatory Visit: Payer: Medicare Other | Admitting: Gastroenterology

## 2016-03-23 ENCOUNTER — Other Ambulatory Visit: Payer: Self-pay

## 2016-03-23 ENCOUNTER — Ambulatory Visit: Payer: Medicare Other | Admitting: Gastroenterology

## 2016-03-23 MED ORDER — RANITIDINE HCL 300 MG PO TABS: 300.0000 mg | ORAL_TABLET | Freq: Every day | ORAL | 2 refills | Status: DC

## 2016-03-31 ENCOUNTER — Ambulatory Visit (INDEPENDENT_AMBULATORY_CARE_PROVIDER_SITE_OTHER): Payer: Medicare Other

## 2016-03-31 DIAGNOSIS — Z23 Encounter for immunization: Secondary | ICD-10-CM

## 2016-03-31 DIAGNOSIS — E538 Deficiency of other specified B group vitamins: Secondary | ICD-10-CM | POA: Diagnosis not present

## 2016-03-31 MED ORDER — CYANOCOBALAMIN 1000 MCG/ML IJ SOLN
1000.0000 ug | Freq: Once | INTRAMUSCULAR | Status: AC
  Administered 2016-03-31: 1000 ug via INTRAMUSCULAR

## 2016-04-10 ENCOUNTER — Ambulatory Visit: Payer: Medicare Other | Admitting: Endocrinology

## 2016-04-15 DIAGNOSIS — M7581 Other shoulder lesions, right shoulder: Secondary | ICD-10-CM | POA: Diagnosis not present

## 2016-04-17 ENCOUNTER — Ambulatory Visit (HOSPITAL_COMMUNITY)
Admission: EM | Admit: 2016-04-17 | Discharge: 2016-04-17 | Disposition: A | Discharge status: Home / Self Care | Payer: Medicare Other | Attending: Family Medicine | Admitting: Family Medicine

## 2016-04-17 ENCOUNTER — Ambulatory Visit (INDEPENDENT_AMBULATORY_CARE_PROVIDER_SITE_OTHER): Payer: Medicare Other

## 2016-04-17 ENCOUNTER — Encounter (HOSPITAL_COMMUNITY): Payer: Self-pay | Admitting: *Deleted

## 2016-04-17 DIAGNOSIS — M47812 Spondylosis without myelopathy or radiculopathy, cervical region: Secondary | ICD-10-CM | POA: Diagnosis not present

## 2016-04-17 MED ORDER — MELOXICAM 7.5 MG PO TABS: 7.5000 mg | ORAL_TABLET | Freq: Every day | ORAL | 0 refills | Status: DC

## 2016-04-17 NOTE — ED Provider Notes (Signed)
[place]    CSN: [id number] Arrival date & time: 04/17/16  1232     History   Chief Complaint Chief Complaint  Patient presents with  . Neck Pain    HPI [name] is a 70 y.o. female.   The history is provided by the patient.  Neck Pain  Pain location:  L side Quality:  Stiffness and shooting Pain radiates to:  Does not radiate Pain severity:  Mild Onset quality:  Sudden Duration:  5 hours Progression:  Unchanged Chronicity:  New Context: not recent injury   Context comment:  Awoke with left neck pain this am, NKI Associated symptoms: no numbness, no paresis, no tingling and no weakness     Past Medical History:  Diagnosis Date  . ANEMIA, IRON DEFICIENCY 05/08/2009  . Angina   . ASYMPTOMATIC POSTMENOPAUSAL STATUS 10/11/2008  . Blood transfusion   . Blood transfusion without reported diagnosis   . Breast cancer ([place]) 09/29/11   invasive grade III ductal ca,assoc high grade dcis,ER/PR=neg  . C. difficile colitis   . Diverticulosis of colon (without mention of hemorrhage)   . Esophageal reflux 06/12/2008  . Gastroparesis   . GOITER, MULTINODULAR 04/02/2009  . Gout, unspecified   . H/O hiatal hernia   . History of lower GI bleeding   . History of radiation therapy 02/08/12-03/25/12   left breast,total 61gy  . Hypokalemia 05/11/2013  . Hypomagnesemia   . HYPOTHYROIDISM, POST-RADIATION 08/13/2009  . Internal hemorrhoids without mention of complication   . Kidney stones    "several"  . Leukopenia   . Migraines   . Obesity   . Osteoarthrosis, unspecified whether generalized or localized, unspecified site   . Other and unspecified hyperlipidemia   . PONV (postoperative nausea and vomiting)   . PUD (peptic ulcer disease)   . Short bowel syndrome   . Shortness of breath on exertion    "sometimes"  . Stricture and stenosis of esophagus   . Thyrotoxicosis without mention of goiter or other cause, without mention of thyrotoxic crisis or storm     . Type II or unspecified type diabetes mellitus without mention of complication, not stated as uncontrolled    no med in years diet controled  . Unspecified essential hypertension   . UTI (urinary tract infection)   . Varicose veins   . VITAMIN B12 DEFICIENCY 08/30/2009    Patient Active Problem List   Diagnosis Date Noted  . 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 UTI (urinary tract infection) 11/27/2014  . UTI (lower urinary tract infection) 11/27/2014  . Acute cystitis with hematuria 11/13/2014  . Hemorrhoids, external 11/04/2014  . Wellness examination 10/03/2014  . Pain in joint, shoulder region 09/24/2014  . Cramp in limb 06/26/2014  . Rectal bleeding 05/02/2014  . External hemorrhoids 05/02/2014  . Hemorrhoids without complication [id number]  . Pain in joint, lower leg 02/01/2014  . Vitamin D deficiency 01/16/2014  . Gastroparesis 09/14/2013  . Other dysphagia 09/04/2013  . Nausea alone 09/04/2013  . Iron deficiency anemia, unspecified 08/28/2013  . Hypomagnesemia 05/11/2013  . Hypokalemia 05/11/2013  . Generalized weakness 05/11/2013  . Dehydration 11/25/2011  . Fatigue 11/16/2011  . Breast cancer ([place]) 09/29/2011  . Family history of breast cancer 08/19/2011  . Primary cancer of lower-inner quadrant of left female breast ([place]) 08/17/2011  . Neck pain on left side 07/29/2011  . Routine general medical examination at a health care facility 06/28/2011  . Edema 11/26/2010  .  CLOSTRIDIUM DIFFICILE COLITIS 02/07/2010  . Blind loop syndrome 10/01/2009  . Abdominal pain 10/01/2009  . Vitamin B 12 deficiency 08/30/2009  . 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 GOITER, MULTINODULAR 04/02/2009  . CONTACT DERMATITIS&OTHER ECZEMA DUE UNSPEC CAUSE 02/20/2009  . URINARY CALCULUS 10/11/2008  . ASYMPTOMATIC POSTMENOPAUSAL STATUS 10/11/2008  . BACK PAIN, CHRONIC 07/03/2008  . Esophageal reflux 06/12/2008  . Diarrhea 06/12/2008  . ABDOMINAL  PAIN-RUQ 05/17/2008  . Diabetes ([place]) 12/10/2006  . Dyslipidemia 12/10/2006  . Gout 12/10/2006  . HYPERTENSION 12/10/2006  . DIVERTICULOSIS, COLON 12/10/2006  . OSTEOARTHRITIS 12/10/2006  . ESOPHAGEAL STRICTURE 08/23/2002  . HIATAL HERNIA 08/23/2002  . INTERNAL HEMORRHOIDS 02/02/2001    Past Surgical History:  Procedure Laterality Date  . ABDOMINAL ADHESION SURGERY  1980's thru 1990's   "several"  . ABDOMINAL HYSTERECTOMY  1970's   with BSO  . BREAST BIOPSY  08/13/11   left breast lower inner quadrant  . BREAST LUMPECTOMY W/ NEEDLE LOCALIZATION  09/29/11   left  breast=lymph node,excision benign/ ER/PR=neg, her 2 Positive  . BUNIONECTOMY  1970's   bilateral  . CHOLECYSTECTOMY  1990's  . COLON SURGERY     "several surgeries for short bowel syndrome"  . COLONOSCOPY  2012   multiple   . DILATION AND CURETTAGE OF UTERUS    . ESOPHAGOGASTRODUODENOSCOPY  2011   multiple   . EYE SURGERY     "long time ago"  . FLEXIBLE SIGMOIDOSCOPY  2011   multiple   . KIDNEY STONE SURGERY  1990's   "tried to go up & get it but pushed it further up"  . LITHOTRIPSY     "4 or 5 times"  . MASTECTOMY W/ NODES PARTIAL  09/29/11   left  . PORT-A-CATH REMOVAL Right 12/19/2013   Procedure: MINOR REMOVAL PORT-A-CATH;  Surgeon: Adin Hector, MD;  Location: [facility];  Service: General;  Laterality: Right;  . PORTACATH PLACEMENT  09/29/2011   Procedure: INSERTION PORT-A-CATH;  Surgeon: Adin Hector, MD;  Location: [facility];  Service: General;  Laterality: N/A;  . Thyroid Ultrasound  12/1994 and 12/1995  . TOTAL KNEE ARTHROPLASTY Right 06/05/2015   Procedure: RIGHT TOTAL KNEE ARTHROPLASTY;  Surgeon: Ninetta Lights, MD;  Location: [facility];  Service: Orthopedics;  Laterality: Right;  . VEIN LIGATION AND STRIPPING  1980's   Right leg    OB History    No data available       Home Medications    Prior to Admission medications   Medication Sig Start Date End Date Taking? Authorizing  Provider  allopurinol (ZYLOPRIM) 100 MG tablet Take 1 tablet (100 mg total) by mouth daily. 12/19/15   Renato Shin, MD  aMILoride (MIDAMOR) 5 MG tablet Take 5 mg by mouth 2 (two) times daily.  06/11/14   Historical Provider, MD  calcium carbonate (TUMS - DOSED IN MG ELEMENTAL CALCIUM) 500 MG chewable tablet Chew 1 tablet by mouth as needed for indigestion or heartburn.    Historical Provider, MD  Calcium Carbonate-Vitamin D (CALCIUM-VITAMIN D) 600-200 MG-UNIT CAPS Take 1 capsule by mouth daily.      Historical Provider, MD  calcium gluconate 650 MG tablet Take 1 tablet (650 mg total) by mouth daily. 01/14/12 05/22/15  Marcy Panning, MD  cyanocobalamin (,VITAMIN B-12,) 1000 MCG/ML injection Inject 1,000 mcg into the muscle every 30 (thirty) days.     Historical Provider, MD  dicyclomine (BENTYL) 10 MG capsule Take 1 capsule (10 mg total) by mouth  3 (three) times daily before meals. 10/16/15   Ladene Artist, MD  ferrous sulfate 325 (65 FE) MG tablet Take 325 mg by mouth daily with breakfast.    Historical Provider, MD  fluorometholone (FML) 0.1 % ophthalmic suspension Place 1 drop into both eyes 2 (two) times daily as needed (Dry Eyes).  03/14/13   Historical Provider, MD  gabapentin (NEURONTIN) 300 MG capsule Take 2 capsules (600 mg total) by mouth 2 (two) times daily. 02/10/16   Renato Shin, MD  gabapentin (NEURONTIN) 300 MG capsule Take 1 capsule by mouth 3  times daily 02/19/16   Renato Shin, MD  hydrocortisone (ANUSOL-HC) 25 MG suppository Place 1 suppository (25 mg total) rectally every 12 (twelve) hours. 01/16/16   Ladene Artist, MD  levothyroxine (SYNTHROID, LEVOTHROID) 175 MCG tablet Take 1 tablet (175 mcg total) by mouth daily before breakfast. 12/19/15   Renato Shin, MD  loperamide (IMODIUM) 2 MG capsule Take 2 capsules (4 mg total) by mouth every 4 (four) hours as needed for diarrhea or loose stools. 01/16/14   Renato Shin, MD  lovastatin (MEVACOR) 20 MG tablet TAKE 1 TABLET BY MOUTH DAILY AT  6 PM. 01/06/16   Renato Shin, MD  ondansetron (ZOFRAN-ODT) 4 MG disintegrating tablet TAKE 1 TABLET BY MOUTH EVERY 8 HOURS AS NEEDED FOR NAUSEA 09/24/15   Renato Shin, MD  pantoprazole (PROTONIX) 40 MG tablet TAKE 1 TABLET 30 MINUTES BEFORE BREAKFAST AND SUPPER 01/25/15   Ladene Artist, MD  pramoxine-hydrocortisone [facility]) cream Apply 1 application topically 2 (two) times daily as needed. 01/16/16   Ladene Artist, MD  ranitidine (ZANTAC) 300 MG tablet Take 1 tablet (300 mg total) by mouth at bedtime. 03/23/16   Ladene Artist, MD  saccharomyces boulardii (FLORASTOR) 250 MG capsule Take 1 capsule (250 mg total) by mouth 2 (two) times daily. 05/09/15   Lori P Hvozdovic, PA-C  thiamine 100 MG tablet Take 100 mg by mouth daily.      Historical Provider, MD    Family History Family History  Problem Relation Age of Onset  . Esophageal cancer Son     deceased  . Diabetes Mother   . Heart disease Mother   . Kidney disease Sister   . Diabetes Father   . Hypertension Father   . Kidney disease Brother     x 3  . Colon cancer Paternal Uncle     Social History Social History  Substance Use Topics  . Smoking status: Former Smoker    Packs/day: 1.00    Years: 10.00    Types: Cigarettes    Quit date: 09/22/1985  . Smokeless tobacco: Never Used  . Alcohol use No     Comment: 09/29/11 "used to drink socially years ago"     Allergies   Aspirin; Codeine; Trazodone and nefazodone; Flagyl [metronidazole]; Iodine; and Morphine and related   Review of Systems Review of Systems  HENT: Negative.   Musculoskeletal: Positive for neck pain and neck stiffness.  Neurological: Negative for tingling, weakness and numbness.  All other systems reviewed and are negative.    Physical Exam Triage Vital Signs ED Triage Vitals  Enc Vitals Group     BP 04/17/16 1247 125/83     Pulse Rate 04/17/16 1247 90     Resp 04/17/16 1247 16     Temp 04/17/16 1247 97.3 F (36.3 C)     Temp Source  04/17/16 1247 Oral     SpO2 04/17/16 1247 99 %  Weight --      Height --      Head Circumference --      Peak Flow --      Pain Score 04/17/16 1257 10     Pain Loc --      Pain Edu? --      Excl. in [place]? --    No data found.   Updated Vital Signs BP 125/83 (BP Location: Left Arm)   Pulse 90   Temp 97.3 F (36.3 C) (Oral)   Resp 16   SpO2 99%   Visual Acuity Right Eye Distance:   Left Eye Distance:   Bilateral Distance:    Right Eye Near:   Left Eye Near:    Bilateral Near:     Physical Exam  Constitutional: She appears well-developed and well-nourished. No distress.  Neck: Trachea normal. Muscular tenderness present. No spinous process tenderness present. No neck rigidity. Decreased range of motion present. No edema present. No Brudzinski's sign and no Kernig's sign noted. No thyromegaly present.    Lymphadenopathy:    She has no cervical adenopathy.  Nursing note and vitals reviewed.    UC Treatments / Results  Labs (all labs ordered are listed, but only abnormal results are displayed) Labs Reviewed - No data to display  EKG  EKG Interpretation None       Radiology No results found. X-rays reviewed and report per radiologist.  Procedures Procedures (including critical care time)  Medications Ordered in UC Medications - No data to display   Initial Impression / Assessment and Plan / UC Course  I have reviewed the triage vital signs and the nursing notes.  Pertinent labs & imaging results that were available during my care of the patient were reviewed by me and considered in my medical decision making (see chart for details).  Clinical Course       Final Clinical Impressions(s) / UC Diagnoses   Final diagnoses:  None    New Prescriptions New Prescriptions   No medications on file     Billy Fischer, MD 04/17/16 1417

## 2016-04-17 NOTE — Discharge Instructions (Signed)
Heat and medicine as needed

## 2016-04-17 NOTE — ED Triage Notes (Signed)
Pt  Woke  Up  With  Pain l  Side  Of  Neck    Denies  Any  Injury          Pain is  Worse  On  Movement     Speech  Is  Intact  Awake  And   Alert    denys  Any numbness  Or  Tingling in  Fingers

## 2016-05-01 ENCOUNTER — Ambulatory Visit (INDEPENDENT_AMBULATORY_CARE_PROVIDER_SITE_OTHER): Payer: Medicare Other | Admitting: Endocrinology

## 2016-05-01 ENCOUNTER — Encounter: Payer: Self-pay | Admitting: Endocrinology

## 2016-05-01 VITALS — BP 134/88 | HR 82 | Wt 213.0 lb

## 2016-05-01 DIAGNOSIS — E042 Nontoxic multinodular goiter: Secondary | ICD-10-CM

## 2016-05-01 DIAGNOSIS — Z0001 Encounter for general adult medical examination with abnormal findings: Secondary | ICD-10-CM | POA: Diagnosis not present

## 2016-05-01 DIAGNOSIS — Z23 Encounter for immunization: Secondary | ICD-10-CM

## 2016-05-01 DIAGNOSIS — E118 Type 2 diabetes mellitus with unspecified complications: Secondary | ICD-10-CM | POA: Diagnosis not present

## 2016-05-01 DIAGNOSIS — I10 Essential (primary) hypertension: Secondary | ICD-10-CM | POA: Diagnosis not present

## 2016-05-01 DIAGNOSIS — M1A9XX0 Chronic gout, unspecified, without tophus (tophi): Secondary | ICD-10-CM | POA: Diagnosis not present

## 2016-05-01 DIAGNOSIS — M542 Cervicalgia: Secondary | ICD-10-CM

## 2016-05-01 LAB — MICROALBUMIN / CREATININE URINE RATIO
CREATININE, U: 153.5 mg/dL
MICROALB/CREAT RATIO: 0.5 mg/g (ref 0.0–30.0)
Microalb, Ur: <0.7 mg/dL (ref 0.0–1.9)

## 2016-05-01 LAB — URIC ACID: Uric Acid, Serum: 4.6 mg/dL (ref 2.4–7.0)

## 2016-05-01 LAB — POCT GLYCOSYLATED HEMOGLOBIN (HGB A1C): Hemoglobin A1C: 5.5

## 2016-05-01 LAB — TSH: TSH: 1.13 u[IU]/mL (ref 0.35–4.50)

## 2016-05-01 MED ORDER — OXYCODONE HCL 15 MG PO TABS: 15.0000 mg | ORAL_TABLET | ORAL | 0 refills | Status: AC | PRN

## 2016-05-01 MED ORDER — PNEUMOCOCCAL 13-VAL CONJ VACC IM SUSP
0.5000 mL | INTRAMUSCULAR | Status: AC
  Administered 2016-05-01: 0.5 mL via INTRAMUSCULAR

## 2016-05-01 NOTE — Patient Instructions (Addendum)
Please consider these measures for your health:  minimize alcohol.  Do not use tobacco products.  Have a colonoscopy at least every 10 years from age 72.  Women should have an annual mammogram from age 68.  Keep firearms safely stored.  Always use seat belts.  have working smoke alarms in your home.  See an eye doctor and dentist regularly.  Never drive under the influence of alcohol or drugs (including prescription drugs).   please let me know what your wishes would be, if artificial life support measures should become necessary.  It is critically important to prevent falling down (keep floor areas well-lit, dry, and free of loose objects.  If you have a cane, walker, or wheelchair, you should use it, even for short trips around the house.  Wear flat-soled shoes.  Also, try not to rush).   Please see a specialist for you neck pain, at the old office.  you will receive a phone call, about a day and time for an appointment.  Please come back for a follow-up appointment in 3 months.

## 2016-05-01 NOTE — Progress Notes (Signed)
Subjective:    Patient ID: [name], female    DOB: [birth year], 70 y.o.   MRN: [id number]  HPI Pt is here for regular wellness examination, and is feeling pretty well in general, and says chronic med probs are stable, except as noted below Past Medical History:  Diagnosis Date  . ANEMIA, IRON DEFICIENCY 05/08/2009  . Angina   . ASYMPTOMATIC POSTMENOPAUSAL STATUS 10/11/2008  . Blood transfusion   . Blood transfusion without reported diagnosis   . Breast cancer ([place]) 09/29/11   invasive grade III ductal ca,assoc high grade dcis,ER/PR=neg  . C. difficile colitis   . Diverticulosis of colon (without mention of hemorrhage)   . Esophageal reflux 06/12/2008  . Gastroparesis   . GOITER, MULTINODULAR 04/02/2009  . Gout, unspecified   . H/O hiatal hernia   . History of lower GI bleeding   . History of radiation therapy 02/08/12-03/25/12   left breast,total 61gy  . Hypokalemia 05/11/2013  . Hypomagnesemia   . HYPOTHYROIDISM, POST-RADIATION 08/13/2009  . Internal hemorrhoids without mention of complication   . Kidney stones    "several"  . Leukopenia   . Migraines   . Obesity   . Osteoarthrosis, unspecified whether generalized or localized, unspecified site   . Other and unspecified hyperlipidemia   . PONV (postoperative nausea and vomiting)   . PUD (peptic ulcer disease)   . Short bowel syndrome   . Shortness of breath on exertion    "sometimes"  . Stricture and stenosis of esophagus   . Thyrotoxicosis without mention of goiter or other cause, without mention of thyrotoxic crisis or storm   . Type II or unspecified type diabetes mellitus without mention of complication, not stated as uncontrolled    no med in years diet controled  . Unspecified essential hypertension   . UTI (urinary tract infection)   . Varicose veins   . VITAMIN B12 DEFICIENCY 08/30/2009    Past Surgical History:  Procedure Laterality Date  . ABDOMINAL ADHESION SURGERY  1980's thru 1990's   "several"    . ABDOMINAL HYSTERECTOMY  1970's   with BSO  . BREAST BIOPSY  08/13/11   left breast lower inner quadrant  . BREAST LUMPECTOMY W/ NEEDLE LOCALIZATION  09/29/11   left  breast=lymph node,excision benign/ ER/PR=neg, her 2 Positive  . BUNIONECTOMY  1970's   bilateral  . CHOLECYSTECTOMY  1990's  . COLON SURGERY     "several surgeries for short bowel syndrome"  . COLONOSCOPY  2012   multiple   . DILATION AND CURETTAGE OF UTERUS    . ESOPHAGOGASTRODUODENOSCOPY  2011   multiple   . EYE SURGERY     "long time ago"  . FLEXIBLE SIGMOIDOSCOPY  2011   multiple   . KIDNEY STONE SURGERY  1990's   "tried to go up & get it but pushed it further up"  . LITHOTRIPSY     "4 or 5 times"  . MASTECTOMY W/ NODES PARTIAL  09/29/11   left  . PORT-A-CATH REMOVAL Right 12/19/2013   Procedure: MINOR REMOVAL PORT-A-CATH;  Surgeon: Adin Hector, MD;  Location: [place];  Service: General;  Laterality: Right;  . PORTACATH PLACEMENT  09/29/2011   Procedure: INSERTION PORT-A-CATH;  Surgeon: Adin Hector, MD;  Location: [place];  Service: General;  Laterality: N/A;  . Thyroid Ultrasound  12/1994 and 12/1995  . TOTAL KNEE ARTHROPLASTY Right 06/05/2015   Procedure: RIGHT TOTAL KNEE ARTHROPLASTY;  Surgeon: Ninetta Lights, MD;  Location: [place];  Service: Orthopedics;  Laterality: Right;  . VEIN LIGATION AND STRIPPING  1980's   Right leg    Social History   Social History  . Marital status: Married    Spouse name: N/A  . Number of children: 2  . Years of education: N/A   Occupational History  . RETIRED    Social History Main Topics  . Smoking status: Former Smoker    Packs/day: 1.00    Years: 10.00    Types: Cigarettes    Quit date: 09/22/1985  . Smokeless tobacco: Never Used  . Alcohol use No     Comment: 09/29/11 "used to drink socially years ago"  . Drug use: No  . Sexual activity: No     Comment: HRT x many yrs   Other Topics Concern  . Not on file   Social History  Narrative   Pt gets regular exercise    Current Outpatient Prescriptions on File Prior to Visit  Medication Sig Dispense Refill  . allopurinol (ZYLOPRIM) 100 MG tablet Take 1 tablet (100 mg total) by mouth daily. 90 tablet 1  . aMILoride (MIDAMOR) 5 MG tablet Take 5 mg by mouth 2 (two) times daily.   11  . calcium carbonate (TUMS - DOSED IN MG ELEMENTAL CALCIUM) 500 MG chewable tablet Chew 1 tablet by mouth as needed for indigestion or heartburn.    . Calcium Carbonate-Vitamin D (CALCIUM-VITAMIN D) 600-200 MG-UNIT CAPS Take 1 capsule by mouth daily.      . cyanocobalamin (,VITAMIN B-12,) 1000 MCG/ML injection Inject 1,000 mcg into the muscle every 30 (thirty) days.     Marland Kitchen dicyclomine (BENTYL) 10 MG capsule Take 1 capsule (10 mg total) by mouth 3 (three) times daily before meals. 90 capsule 11  . ferrous sulfate 325 (65 FE) MG tablet Take 325 mg by mouth daily with breakfast.    . fluorometholone (FML) 0.1 % ophthalmic suspension Place 1 drop into both eyes 2 (two) times daily as needed (Dry Eyes).     . gabapentin (NEURONTIN) 300 MG capsule Take 2 capsules (600 mg total) by mouth 2 (two) times daily. 120 capsule 11  . hydrocortisone (ANUSOL-HC) 25 MG suppository Place 1 suppository (25 mg total) rectally every 12 (twelve) hours. 12 suppository 1  . levothyroxine (SYNTHROID, LEVOTHROID) 175 MCG tablet Take 1 tablet (175 mcg total) by mouth daily before breakfast. 90 tablet 1  . loperamide (IMODIUM) 2 MG capsule Take 2 capsules (4 mg total) by mouth every 4 (four) hours as needed for diarrhea or loose stools. 30 capsule 0  . lovastatin (MEVACOR) 20 MG tablet TAKE 1 TABLET BY MOUTH DAILY AT 6 PM. 90 tablet 1  . meloxicam (MOBIC) 7.5 MG tablet Take 1 tablet (7.5 mg total) by mouth daily. 30 tablet 0  . pantoprazole (PROTONIX) 40 MG tablet TAKE 1 TABLET 30 MINUTES BEFORE BREAKFAST AND SUPPER 180 tablet 1  . pramoxine-hydrocortisone (ANALPRAM HC) cream Apply 1 application topically 2 (two) times daily  as needed. 90 g 2  . ranitidine (ZANTAC) 300 MG tablet Take 1 tablet (300 mg total) by mouth at bedtime. 90 tablet 2  . saccharomyces boulardii (FLORASTOR) 250 MG capsule Take 1 capsule (250 mg total) by mouth 2 (two) times daily. 60 capsule 1  . thiamine 100 MG tablet Take 100 mg by mouth daily.      . calcium gluconate 650 MG tablet Take 1 tablet (650 mg total) by mouth daily. 30 tablet 2  Current Facility-Administered Medications on File Prior to Visit  Medication Dose Route Frequency Provider Last Rate Last Dose  . acetaminophen (TYLENOL) tablet 1,000 mg  1,000 mg Oral Once Amada Kingfisher, MD        Allergies  Allergen Reactions  . Aspirin Other (See Comments)    REACTION: Gi Intolerance/ Burning in stomach  . Codeine Itching  . Trazodone And Nefazodone     "sick"  . Flagyl [Metronidazole] Rash  . Iodine Itching    Allergic to IVP dye  . Morphine And Related Itching    Family History  Problem Relation Age of Onset  . Esophageal cancer Son     deceased  . Diabetes Mother   . Heart disease Mother   . Kidney disease Sister   . Diabetes Father   . Hypertension Father   . Kidney disease Brother     x 3  . Colon cancer Paternal Uncle     BP 134/88   Pulse 82   Wt 213 lb (96.6 kg)   SpO2 98%   BMI 35.45 kg/m     Review of Systems  Constitutional: Negative for unexpected weight change.  HENT: Negative for hearing loss.   Eyes: Negative for visual disturbance.  Respiratory: Negative for shortness of breath.   Cardiovascular: Negative for chest pain.  Gastrointestinal:       No change in multiple chronic GI sxs  Endocrine: Negative for cold intolerance.  Genitourinary: Negative for hematuria.  Musculoskeletal: Negative for gait problem.  Skin: Negative for rash.  Allergic/Immunologic: Negative for environmental allergies.  Neurological: Negative for syncope.  Hematological: Does not bruise/bleed easily.  Psychiatric/Behavioral: Negative for dysphoric mood.        Objective:   Physical Exam VS: see vs page GEN: no distress HEAD: head: no deformity eyes: no periorbital swelling, no proptosis external nose and ears are normal mouth: no lesion seen CHEST WALL: no deformity LUNGS: clear to auscultation CV: reg rate and rhythm, no murmur ABD: abdomen is soft, nontender.  no hepatosplenomegaly.  not distended.  no hernia MUSCULOSKELETAL: muscle bulk and strength are grossly normal.  no obvious joint swelling.  gait is normal and steady EXTEMITIES: no deformity.  no ulcer on the feet.  feet are of normal color and temp.  no edema PULSES: dorsalis pedis intact bilat.  no carotid bruit NEURO:  cn 2-12 grossly intact.   readily moves all 4's.  sensation is intact to touch on the feet SKIN:  Normal texture and temperature.  No rash or suspicious lesion is visible.   PSYCH: alert, well-oriented.  Does not appear anxious nor depressed.    i personally reviewed electrocardiogram tracing (today): Indication: HTN Impression: normal     Assessment & Plan:  Wellness visit today, with problems stable, except as noted.   SEPARATE EVALUATION FOLLOWS--EACH PROBLEM HERE IS NEW, NOT RESPONDING TO TREATMENT, OR POSES SIGNIFICANT RISK TO THE PATIENT'S HEALTH: HISTORY OF THE PRESENT ILLNESS: Pt states few weeks of slight swelling at the left ant neck, and assoc pain.  No help with tramadol.   PAST MEDICAL HISTORY Past Medical History:  Diagnosis Date  . ANEMIA, IRON DEFICIENCY 05/08/2009  . Angina   . ASYMPTOMATIC POSTMENOPAUSAL STATUS 10/11/2008  . Blood transfusion   . Blood transfusion without reported diagnosis   . Breast cancer ([place]) 09/29/11   invasive grade III ductal ca,assoc high grade dcis,ER/PR=neg  . C. difficile colitis   . Diverticulosis of colon (without mention of hemorrhage)   .  Esophageal reflux 06/12/2008  . Gastroparesis   . GOITER, MULTINODULAR 04/02/2009  . Gout, unspecified   . H/O hiatal hernia   . History of lower GI bleeding   .  History of radiation therapy 02/08/12-03/25/12   left breast,total 61gy  . Hypokalemia 05/11/2013  . Hypomagnesemia   . HYPOTHYROIDISM, POST-RADIATION 08/13/2009  . Internal hemorrhoids without mention of complication   . Kidney stones    "several"  . Leukopenia   . Migraines   . Obesity   . Osteoarthrosis, unspecified whether generalized or localized, unspecified site   . Other and unspecified hyperlipidemia   . PONV (postoperative nausea and vomiting)   . PUD (peptic ulcer disease)   . Short bowel syndrome   . Shortness of breath on exertion    "sometimes"  . Stricture and stenosis of esophagus   . Thyrotoxicosis without mention of goiter or other cause, without mention of thyrotoxic crisis or storm   . Type II or unspecified type diabetes mellitus without mention of complication, not stated as uncontrolled    no med in years diet controled  . Unspecified essential hypertension   . UTI (urinary tract infection)   . Varicose veins   . VITAMIN B12 DEFICIENCY 08/30/2009    Past Surgical History:  Procedure Laterality Date  . ABDOMINAL ADHESION SURGERY  1980's thru 1990's   "several"  . ABDOMINAL HYSTERECTOMY  1970's   with BSO  . BREAST BIOPSY  08/13/11   left breast lower inner quadrant  . BREAST LUMPECTOMY W/ NEEDLE LOCALIZATION  09/29/11   left  breast=lymph node,excision benign/ ER/PR=neg, her 2 Positive  . BUNIONECTOMY  1970's   bilateral  . CHOLECYSTECTOMY  1990's  . COLON SURGERY     "several surgeries for short bowel syndrome"  . COLONOSCOPY  2012   multiple   . DILATION AND CURETTAGE OF UTERUS    . ESOPHAGOGASTRODUODENOSCOPY  2011   multiple   . EYE SURGERY     "long time ago"  . FLEXIBLE SIGMOIDOSCOPY  2011   multiple   . KIDNEY STONE SURGERY  1990's   "tried to go up & get it but pushed it further up"  . LITHOTRIPSY     "4 or 5 times"  . MASTECTOMY W/ NODES PARTIAL  09/29/11   left  . PORT-A-CATH REMOVAL Right 12/19/2013   Procedure: MINOR REMOVAL  PORT-A-CATH;  Surgeon: Adin Hector, MD;  Location: [place];  Service: General;  Laterality: Right;  . PORTACATH PLACEMENT  09/29/2011   Procedure: INSERTION PORT-A-CATH;  Surgeon: Adin Hector, MD;  Location: [place];  Service: General;  Laterality: N/A;  . Thyroid Ultrasound  12/1994 and 12/1995  . TOTAL KNEE ARTHROPLASTY Right 06/05/2015   Procedure: RIGHT TOTAL KNEE ARTHROPLASTY;  Surgeon: Ninetta Lights, MD;  Location: [place];  Service: Orthopedics;  Laterality: Right;  . VEIN LIGATION AND STRIPPING  1980's   Right leg    Social History   Social History  . Marital status: Married    Spouse name: N/A  . Number of children: 2  . Years of education: N/A   Occupational History  . RETIRED    Social History Main Topics  . Smoking status: Former Smoker    Packs/day: 1.00    Years: 10.00    Types: Cigarettes    Quit date: 09/22/1985  . Smokeless tobacco: Never Used  . Alcohol use No     Comment: 09/29/11 "used to drink socially years ago"  .  Drug use: No  . Sexual activity: No     Comment: HRT x many yrs   Other Topics Concern  . Not on file   Social History Narrative   Pt gets regular exercise    Current Outpatient Prescriptions on File Prior to Visit  Medication Sig Dispense Refill  . allopurinol (ZYLOPRIM) 100 MG tablet Take 1 tablet (100 mg total) by mouth daily. 90 tablet 1  . aMILoride (MIDAMOR) 5 MG tablet Take 5 mg by mouth 2 (two) times daily.   11  . calcium carbonate (TUMS - DOSED IN MG ELEMENTAL CALCIUM) 500 MG chewable tablet Chew 1 tablet by mouth as needed for indigestion or heartburn.    . Calcium Carbonate-Vitamin D (CALCIUM-VITAMIN D) 600-200 MG-UNIT CAPS Take 1 capsule by mouth daily.      . cyanocobalamin (,VITAMIN B-12,) 1000 MCG/ML injection Inject 1,000 mcg into the muscle every 30 (thirty) days.     Marland Kitchen dicyclomine (BENTYL) 10 MG capsule Take 1 capsule (10 mg total) by mouth 3 (three) times daily before meals. 90 capsule 11  .  ferrous sulfate 325 (65 FE) MG tablet Take 325 mg by mouth daily with breakfast.    . fluorometholone (FML) 0.1 % ophthalmic suspension Place 1 drop into both eyes 2 (two) times daily as needed (Dry Eyes).     . gabapentin (NEURONTIN) 300 MG capsule Take 2 capsules (600 mg total) by mouth 2 (two) times daily. 120 capsule 11  . hydrocortisone (ANUSOL-HC) 25 MG suppository Place 1 suppository (25 mg total) rectally every 12 (twelve) hours. 12 suppository 1  . levothyroxine (SYNTHROID, LEVOTHROID) 175 MCG tablet Take 1 tablet (175 mcg total) by mouth daily before breakfast. 90 tablet 1  . loperamide (IMODIUM) 2 MG capsule Take 2 capsules (4 mg total) by mouth every 4 (four) hours as needed for diarrhea or loose stools. 30 capsule 0  . lovastatin (MEVACOR) 20 MG tablet TAKE 1 TABLET BY MOUTH DAILY AT 6 PM. 90 tablet 1  . meloxicam (MOBIC) 7.5 MG tablet Take 1 tablet (7.5 mg total) by mouth daily. 30 tablet 0  . pantoprazole (PROTONIX) 40 MG tablet TAKE 1 TABLET 30 MINUTES BEFORE BREAKFAST AND SUPPER 180 tablet 1  . pramoxine-hydrocortisone (ANALPRAM HC) cream Apply 1 application topically 2 (two) times daily as needed. 90 g 2  . ranitidine (ZANTAC) 300 MG tablet Take 1 tablet (300 mg total) by mouth at bedtime. 90 tablet 2  . saccharomyces boulardii (FLORASTOR) 250 MG capsule Take 1 capsule (250 mg total) by mouth 2 (two) times daily. 60 capsule 1  . thiamine 100 MG tablet Take 100 mg by mouth daily.      . calcium gluconate 650 MG tablet Take 1 tablet (650 mg total) by mouth daily. 30 tablet 2   Current Facility-Administered Medications on File Prior to Visit  Medication Dose Route Frequency Provider Last Rate Last Dose  . acetaminophen (TYLENOL) tablet 1,000 mg  1,000 mg Oral Once Amada Kingfisher, MD        Allergies  Allergen Reactions  . Aspirin Other (See Comments)    REACTION: Gi Intolerance/ Burning in stomach  . Codeine Itching  . Trazodone And Nefazodone     "sick"  . Flagyl  [Metronidazole] Rash  . Iodine Itching    Allergic to IVP dye  . Morphine And Related Itching    Family History  Problem Relation Age of Onset  . Esophageal cancer Son     deceased  . Diabetes  Mother   . Heart disease Mother   . Kidney disease Sister   . Diabetes Father   . Hypertension Father   . Kidney disease Brother     x 3  . Colon cancer Paternal Uncle     BP 134/88   Pulse 82   Wt 213 lb (96.6 kg)   SpO2 98%   BMI 35.45 kg/m  REVIEW OF SYSTEMS: Denies fever and numbness PHYSICAL EXAMINATION: VITAL SIGNS:  See vs page GENERAL: no distress NECK: There is no palpable thyroid enlargement.  No thyroid nodule is palpable.  No palpable lymphadenopathy at the anterior neck. NODES:  None palpable at the neck LAB/XRAY RESULTS: Lab Results  Component Value Date   HGBA1C 5.5 05/01/2016  X-rays: OA/disc dz Lab Results  Component Value Date   TSH 1.13 05/01/2016   T4TOTAL 8.1 07/29/2007   Lab Results  Component Value Date   LABURIC 4.6 05/01/2016  IMPRESSION: Neck pain, persistent DM: well-controlled Gout: well-controlled Hypothyroidism: well-replaced PLAN:  Please continue the same synthroid and allopurinol. Please see a specialist for you neck pain I prescribed oxy-IR.  We'll follow a1c.

## 2016-05-06 ENCOUNTER — Other Ambulatory Visit: Payer: Self-pay | Admitting: Endocrinology

## 2016-05-06 DIAGNOSIS — E119 Type 2 diabetes mellitus without complications: Secondary | ICD-10-CM

## 2016-05-06 DIAGNOSIS — E785 Hyperlipidemia, unspecified: Secondary | ICD-10-CM

## 2016-05-06 DIAGNOSIS — M109 Gout, unspecified: Secondary | ICD-10-CM

## 2016-05-06 DIAGNOSIS — E559 Vitamin D deficiency, unspecified: Secondary | ICD-10-CM

## 2016-05-06 DIAGNOSIS — D509 Iron deficiency anemia, unspecified: Secondary | ICD-10-CM

## 2016-05-19 DIAGNOSIS — M25561 Pain in right knee: Secondary | ICD-10-CM | POA: Diagnosis not present

## 2016-05-19 DIAGNOSIS — M545 Low back pain: Secondary | ICD-10-CM | POA: Diagnosis not present

## 2016-05-20 ENCOUNTER — Telehealth: Payer: Self-pay | Admitting: Endocrinology

## 2016-05-20 NOTE — Telephone Encounter (Addendum)
Forms have been received and MD will review once he returns from vacation on 06/04/2016.

## 2016-05-20 NOTE — Telephone Encounter (Signed)
[name] calling on the status of  Of patient ankle and knee supply's. Please advise [phone]

## 2016-05-28 ENCOUNTER — Other Ambulatory Visit: Payer: Self-pay | Admitting: Nurse Practitioner

## 2016-06-07 NOTE — Progress Notes (Signed)
Subjective:    Patient ID: [name], female    DOB: 1945/07/20, 71 y.o.   MRN: [id number]  HPI Pt states 1 month of slight pain at the ant abdominal skin, and assoc rash.  She had a blister there in 2016.  She now takes B-12 orally.  Past Medical History:  Diagnosis Date  . ANEMIA, IRON DEFICIENCY 05/08/2009  . Angina   . ASYMPTOMATIC POSTMENOPAUSAL STATUS 10/11/2008  . Blood transfusion   . Blood transfusion without reported diagnosis   . Breast cancer ([place]) 09/29/11   invasive grade III ductal ca,assoc high grade dcis,ER/PR=neg  . C. difficile colitis   . Diverticulosis of colon (without mention of hemorrhage)   . Esophageal reflux 06/12/2008  . Gastroparesis   . GOITER, MULTINODULAR 04/02/2009  . Gout, unspecified   . H/O hiatal hernia   . History of lower GI bleeding   . History of radiation therapy 02/08/12-03/25/12   left breast,total 61gy  . Hypokalemia 05/11/2013  . Hypomagnesemia   . HYPOTHYROIDISM, POST-RADIATION 08/13/2009  . Internal hemorrhoids without mention of complication   . Kidney stones    "several"  . Leukopenia   . Migraines   . Obesity   . Osteoarthrosis, unspecified whether generalized or localized, unspecified site   . Other and unspecified hyperlipidemia   . PONV (postoperative nausea and vomiting)   . PUD (peptic ulcer disease)   . Short bowel syndrome   . Shortness of breath on exertion    "sometimes"  . Stricture and stenosis of esophagus   . Thyrotoxicosis without mention of goiter or other cause, without mention of thyrotoxic crisis or storm   . Type II or unspecified type diabetes mellitus without mention of complication, not stated as uncontrolled    no med in years diet controled  . Unspecified essential hypertension   . UTI (urinary tract infection)   . Varicose veins   . VITAMIN B12 DEFICIENCY 08/30/2009    Past Surgical History:  Procedure Laterality Date  . ABDOMINAL ADHESION SURGERY  1980's thru 1990's   "several"  .  ABDOMINAL HYSTERECTOMY  1970's   with BSO  . BREAST BIOPSY  08/13/11   left breast lower inner quadrant  . BREAST LUMPECTOMY W/ NEEDLE LOCALIZATION  09/29/11   left  breast=lymph node,excision benign/ ER/PR=neg, her 2 Positive  . BUNIONECTOMY  1970's   bilateral  . CHOLECYSTECTOMY  1990's  . COLON SURGERY     "several surgeries for short bowel syndrome"  . COLONOSCOPY  2012   multiple   . DILATION AND CURETTAGE OF UTERUS    . ESOPHAGOGASTRODUODENOSCOPY  2011   multiple   . EYE SURGERY     "long time ago"  . FLEXIBLE SIGMOIDOSCOPY  2011   multiple   . KIDNEY STONE SURGERY  1990's   "tried to go up & get it but pushed it further up"  . LITHOTRIPSY     "4 or 5 times"  . MASTECTOMY W/ NODES PARTIAL  09/29/11   left  . PORT-A-CATH REMOVAL Right 12/19/2013   Procedure: MINOR REMOVAL PORT-A-CATH;  Surgeon: Adin Hector, MD;  Location: [facility];  Service: General;  Laterality: Right;  . PORTACATH PLACEMENT  09/29/2011   Procedure: INSERTION PORT-A-CATH;  Surgeon: Adin Hector, MD;  Location: [facility];  Service: General;  Laterality: N/A;  . Thyroid Ultrasound  12/1994 and 12/1995  . TOTAL KNEE ARTHROPLASTY Right 06/05/2015   Procedure: RIGHT TOTAL KNEE ARTHROPLASTY;  Surgeon: Quillian Quince  Dennie Bible, MD;  Location: [facility];  Service: Orthopedics;  Laterality: Right;  . VEIN LIGATION AND STRIPPING  1980's   Right leg    Social History   Social History  . Marital status: Married    Spouse name: N/A  . Number of children: 2  . Years of education: N/A   Occupational History  . RETIRED    Social History Main Topics  . Smoking status: Former Smoker    Packs/day: 1.00    Years: 10.00    Types: Cigarettes    Quit date: 09/22/1985  . Smokeless tobacco: Never Used  . Alcohol use No     Comment: 09/29/11 "used to drink socially years ago"  . Drug use: No  . Sexual activity: No     Comment: HRT x many yrs   Other Topics Concern  . Not on file   Social History Narrative    Pt gets regular exercise    Current Outpatient Prescriptions on File Prior to Visit  Medication Sig Dispense Refill  . allopurinol (ZYLOPRIM) 100 MG tablet TAKE 1 TABLET BY MOUTH  DAILY 90 tablet 3  . aMILoride (MIDAMOR) 5 MG tablet Take 5 mg by mouth 2 (two) times daily.   11  . calcium carbonate (TUMS - DOSED IN MG ELEMENTAL CALCIUM) 500 MG chewable tablet Chew 1 tablet by mouth as needed for indigestion or heartburn.    . Calcium Carbonate-Vitamin D (CALCIUM-VITAMIN D) 600-200 MG-UNIT CAPS Take 1 capsule by mouth daily.      . cyanocobalamin (,VITAMIN B-12,) 1000 MCG/ML injection Inject 1,000 mcg into the muscle every 30 (thirty) days.     Marland Kitchen dicyclomine (BENTYL) 10 MG capsule Take 1 capsule (10 mg total) by mouth 3 (three) times daily before meals. 90 capsule 11  . ferrous sulfate 325 (65 FE) MG tablet Take 325 mg by mouth daily with breakfast.    . fluorometholone (FML) 0.1 % ophthalmic suspension Place 1 drop into both eyes 2 (two) times daily as needed (Dry Eyes).     . gabapentin (NEURONTIN) 300 MG capsule Take 2 capsules (600 mg total) by mouth 2 (two) times daily. 120 capsule 11  . hydrocortisone (ANUSOL-HC) 25 MG suppository Place 1 suppository (25 mg total) rectally every 12 (twelve) hours. 12 suppository 1  . levothyroxine (SYNTHROID, LEVOTHROID) 175 MCG tablet TAKE 1 TABLET BY MOUTH  DAILY BEFORE BREAKFAST 90 tablet 3  . loperamide (IMODIUM) 2 MG capsule Take 2 capsules (4 mg total) by mouth every 4 (four) hours as needed for diarrhea or loose stools. 30 capsule 0  . lovastatin (MEVACOR) 20 MG tablet TAKE 1 TABLET BY MOUTH DAILY AT 6 PM. 90 tablet 1  . meloxicam (MOBIC) 7.5 MG tablet Take 1 tablet (7.5 mg total) by mouth daily. 30 tablet 0  . pantoprazole (PROTONIX) 40 MG tablet TAKE 1 TABLET 30 MINUTES BEFORE BREAKFAST AND SUPPER 180 tablet 1  . pramoxine-hydrocortisone (ANALPRAM HC) cream Apply 1 application topically 2 (two) times daily as needed. 90 g 2  . ranitidine (ZANTAC)  300 MG tablet Take 1 tablet (300 mg total) by mouth at bedtime. 90 tablet 2  . saccharomyces boulardii (FLORASTOR) 250 MG capsule Take 1 capsule (250 mg total) by mouth 2 (two) times daily. 60 capsule 1  . thiamine 100 MG tablet Take 100 mg by mouth daily.      . calcium gluconate 650 MG tablet Take 1 tablet (650 mg total) by mouth daily. 30 tablet 2  Current Facility-Administered Medications on File Prior to Visit  Medication Dose Route Frequency Provider Last Rate Last Dose  . acetaminophen (TYLENOL) tablet 1,000 mg  1,000 mg Oral Once Amada Kingfisher, MD        Allergies  Allergen Reactions  . Aspirin Other (See Comments)    REACTION: Gi Intolerance/ Burning in stomach  . Codeine Itching  . Trazodone And Nefazodone     "sick"  . Flagyl [Metronidazole] Rash  . Iodine Itching    Allergic to IVP dye  . Morphine And Related Itching    Family History  Problem Relation Age of Onset  . Esophageal cancer Son     deceased  . Diabetes Mother   . Heart disease Mother   . Kidney disease Sister   . Diabetes Father   . Hypertension Father   . Kidney disease Brother     x 3  . Colon cancer Paternal Uncle     BP 122/80   Pulse 92   Ht 5\' 5"  (1.651 m)   Wt 210 lb (95.3 kg)   SpO2 99%   BMI 34.95 kg/m    Review of Systems Denies fever and drainage.     Objective:   Physical Exam VITAL SIGNS:  See vs page.   GENERAL: no distress.  Ant abd: 4 cm area of thickened and hyperpigmented skin, with rim of erythema.  No ulcer.      Assessment & Plan:  Cellulitis, new. I have sent a prescription to your pharmacy, for an antibiotic pill. B-12 deficiency: well-controlled on oral rx.  Please continue the same medication   Subjective:   Patient here for Medicare annual wellness visit and management of other chronic and acute problems.     Risk factors: advanced age.    Roster of Physicians Providing Medical Care to Patient:  See "snapshot."    Activities of Daily Living: In your  present state of health, do you have any difficulty performing the following activities (lives alone--widowed)?:  Preparing food and eating?: No  Bathing yourself: No  Getting dressed: No  Using the toilet: No  Moving around from place to place: No  In the past year have you fallen or had a near fall?: No    Home Safety: Has smoke detector and wears seat belts. No firearms. No excess sun exposure.    Diet and Exercise  Current exercise habits: pt says good Dietary issues discussed: pt reports a healthy diet   Depression Screen  Q1: Over the past two weeks, have you felt down, depressed or hopeless? She has depression since widowed in 2017.  Denies SI.  Q2: Over the past two weeks, have you felt little interest or pleasure in doing things? no   The following portions of the patient's history were reviewed and updated as appropriate: allergies, current medications, past family history, past medical history, past social history, past surgical history and problem list.   Review of Systems  Denies hearing loss, and visual loss Objective:   Vision:  Advertising account executive, so she declines VA today.  Hearing: grossly normal Body mass index:  See vs page Msk: pt easily and quickly performs "get-up-and-go" from a sitting position.  Cognitive Impairment Assessment: cognition, memory and judgment appear normal.  remembers 3/3 at 5 minutes.  excellent recall.  can easily read and write a sentence.  alert and oriented x 3.     Assessment:   Medicare wellness utd on preventive parameters    Plan:  During the course of the visit the patient was educated and counseled about appropriate screening and preventive services including:       Fall prevention   Screening mammography  Bone densitometry screening  Diabetes screening  Nutrition counseling   Vaccines / LABS Zostavax / Pneumococcal Vaccine  today   Patient Instructions (the written plan) was given to the patient.

## 2016-06-08 ENCOUNTER — Other Ambulatory Visit: Payer: Self-pay | Admitting: Gastroenterology

## 2016-06-10 ENCOUNTER — Ambulatory Visit (INDEPENDENT_AMBULATORY_CARE_PROVIDER_SITE_OTHER): Payer: Medicare Other | Admitting: Endocrinology

## 2016-06-10 ENCOUNTER — Encounter: Payer: Self-pay | Admitting: Endocrinology

## 2016-06-10 VITALS — BP 122/80 | HR 92 | Ht 65.0 in | Wt 210.0 lb

## 2016-06-10 DIAGNOSIS — E538 Deficiency of other specified B group vitamins: Secondary | ICD-10-CM

## 2016-06-10 DIAGNOSIS — Z780 Asymptomatic menopausal state: Secondary | ICD-10-CM

## 2016-06-10 LAB — VITAMIN B12: VITAMIN B 12: 516 pg/mL (ref 211–911)

## 2016-06-10 MED ORDER — DOXYCYCLINE HYCLATE 100 MG PO TABS: 100.0000 mg | ORAL_TABLET | Freq: Two times a day (BID) | ORAL | 0 refills | Status: DC

## 2016-06-10 NOTE — Patient Instructions (Addendum)
Please come back for a follow-up appointment in 4 months.   I have sent a prescription to your pharmacy, for an antibiotic pill. blood tests are requested for you today.  We'll let you know about the results. Please consider these measures for your health:  minimize alcohol.  Do not use tobacco products.  Have a colonoscopy at least every 10 years from age 73.  Women should have an annual mammogram from age 60.  Keep firearms safely stored.  Always use seat belts.  have working smoke alarms in your home.  See an eye doctor and dentist regularly.  Never drive under the influence of alcohol or drugs (including prescription drugs).   It is critically important to prevent falling down (keep floor areas well-lit, dry, and free of loose objects.  If you have a cane, walker, or wheelchair, you should use it, even for short trips around the house.  Wear flat-soled shoes.  Also, try not to rush).

## 2016-06-10 NOTE — Progress Notes (Signed)
we discussed code status.  pt requests full code, but would not want to be started or maintained on artificial life-support measures if there was not a reasonable chance of recovery 

## 2016-06-22 ENCOUNTER — Encounter: Payer: Self-pay | Admitting: Endocrinology

## 2016-06-22 ENCOUNTER — Telehealth: Payer: Self-pay | Admitting: Endocrinology

## 2016-06-22 ENCOUNTER — Ambulatory Visit (INDEPENDENT_AMBULATORY_CARE_PROVIDER_SITE_OTHER): Payer: Medicare Other | Admitting: Endocrinology

## 2016-06-22 VITALS — BP 122/84 | HR 94 | Ht 65.0 in | Wt 212.0 lb

## 2016-06-22 DIAGNOSIS — R21 Rash and other nonspecific skin eruption: Secondary | ICD-10-CM

## 2016-06-22 MED ORDER — HYDROCODONE-ACETAMINOPHEN 5-325 MG PO TABS: 1.0000 | ORAL_TABLET | Freq: Four times a day (QID) | ORAL | 0 refills | Status: AC | PRN

## 2016-06-22 MED ORDER — VALACYCLOVIR HCL 1 G PO TABS: 1000.0000 mg | ORAL_TABLET | Freq: Three times a day (TID) | ORAL | 0 refills | Status: DC

## 2016-06-22 NOTE — Progress Notes (Signed)
Subjective:    Patient ID: [name], female    DOB: 04/06/1946, 71 y.o.   MRN: [id number]  HPI Pt states few weeks of moderate rash, and assoc pain.  I had rx'ed antibiotic, but it did not help.  She says the pain is out of proportion to the rash.  Past Medical History:  Diagnosis Date  . ANEMIA, IRON DEFICIENCY 05/08/2009  . Angina   . ASYMPTOMATIC POSTMENOPAUSAL STATUS 10/11/2008  . Blood transfusion   . Blood transfusion without reported diagnosis   . Breast cancer ([facility]) 09/29/11   invasive grade III ductal ca,assoc high grade dcis,ER/PR=neg  . C. difficile colitis   . Diverticulosis of colon (without mention of hemorrhage)   . Esophageal reflux 06/12/2008  . Gastroparesis   . GOITER, MULTINODULAR 04/02/2009  . Gout, unspecified   . H/O hiatal hernia   . History of lower GI bleeding   . History of radiation therapy 02/08/12-03/25/12   left breast,total 59gy  . Hypokalemia 05/11/2013  . Hypomagnesemia   . HYPOTHYROIDISM, POST-RADIATION 08/13/2009  . Internal hemorrhoids without mention of complication   . Kidney stones    "several"  . Leukopenia   . Migraines   . Obesity   . Osteoarthrosis, unspecified whether generalized or localized, unspecified site   . Other and unspecified hyperlipidemia   . PONV (postoperative nausea and vomiting)   . PUD (peptic ulcer disease)   . Short bowel syndrome   . Shortness of breath on exertion    "sometimes"  . Stricture and stenosis of esophagus   . Thyrotoxicosis without mention of goiter or other cause, without mention of thyrotoxic crisis or storm   . Type II or unspecified type diabetes mellitus without mention of complication, not stated as uncontrolled    no med in years diet controled  . Unspecified essential hypertension   . UTI (urinary tract infection)   . Varicose veins   . VITAMIN B12 DEFICIENCY 08/30/2009    Past Surgical History:  Procedure Laterality Date  . ABDOMINAL ADHESION SURGERY  1980's thru 1990's   "several"  . ABDOMINAL HYSTERECTOMY  1970's   with BSO  . BREAST BIOPSY  08/13/11   left breast lower inner quadrant  . BREAST LUMPECTOMY W/ NEEDLE LOCALIZATION  09/29/11   left  breast=lymph node,excision benign/ ER/PR=neg, her 2 Positive  . BUNIONECTOMY  1970's   bilateral  . CHOLECYSTECTOMY  1990's  . COLON SURGERY     "several surgeries for short bowel syndrome"  . COLONOSCOPY  2012   multiple   . DILATION AND CURETTAGE OF UTERUS    . ESOPHAGOGASTRODUODENOSCOPY  2011   multiple   . EYE SURGERY     "long time ago"  . FLEXIBLE SIGMOIDOSCOPY  2011   multiple   . KIDNEY STONE SURGERY  1990's   "tried to go up & get it but pushed it further up"  . LITHOTRIPSY     "4 or 5 times"  . MASTECTOMY W/ NODES PARTIAL  09/29/11   left  . PORT-A-CATH REMOVAL Right 12/19/2013   Procedure: MINOR REMOVAL PORT-A-CATH;  Surgeon: Adin Hector, MD;  Location: [facility];  Service: General;  Laterality: Right;  . PORTACATH PLACEMENT  09/29/2011   Procedure: INSERTION PORT-A-CATH;  Surgeon: Adin Hector, MD;  Location: [place];  Service: General;  Laterality: N/A;  . Thyroid Ultrasound  12/1994 and 12/1995  . TOTAL KNEE ARTHROPLASTY Right 06/05/2015   Procedure: RIGHT TOTAL KNEE ARTHROPLASTY;  Surgeon: Ninetta Lights, MD;  Location: [facility];  Service: Orthopedics;  Laterality: Right;  . VEIN LIGATION AND STRIPPING  1980's   Right leg    Social History   Social History  . Marital status: Married    Spouse name: N/A  . Number of children: 2  . Years of education: N/A   Occupational History  . RETIRED    Social History Main Topics  . Smoking status: Former Smoker    Packs/day: 1.00    Years: 10.00    Types: Cigarettes    Quit date: 09/22/1985  . Smokeless tobacco: Never Used  . Alcohol use No     Comment: 09/29/11 "used to drink socially years ago"  . Drug use: No  . Sexual activity: No     Comment: HRT x many yrs   Other Topics Concern  . Not on file   Social  History Narrative   Pt gets regular exercise    Current Outpatient Prescriptions on File Prior to Visit  Medication Sig Dispense Refill  . allopurinol (ZYLOPRIM) 100 MG tablet TAKE 1 TABLET BY MOUTH  DAILY 90 tablet 3  . aMILoride (MIDAMOR) 5 MG tablet Take 5 mg by mouth 2 (two) times daily.   11  . calcium carbonate (TUMS - DOSED IN MG ELEMENTAL CALCIUM) 500 MG chewable tablet Chew 1 tablet by mouth as needed for indigestion or heartburn.    . Calcium Carbonate-Vitamin D (CALCIUM-VITAMIN D) 600-200 MG-UNIT CAPS Take 1 capsule by mouth daily.      . cyanocobalamin (,VITAMIN B-12,) 1000 MCG/ML injection Inject 1,000 mcg into the muscle every 30 (thirty) days.     Marland Kitchen dicyclomine (BENTYL) 10 MG capsule Take 1 capsule (10 mg total) by mouth 3 (three) times daily before meals. 90 capsule 11  . ferrous sulfate 325 (65 FE) MG tablet Take 325 mg by mouth daily with breakfast.    . fluorometholone (FML) 0.1 % ophthalmic suspension Place 1 drop into both eyes 2 (two) times daily as needed (Dry Eyes).     . gabapentin (NEURONTIN) 300 MG capsule Take 2 capsules (600 mg total) by mouth 2 (two) times daily. 120 capsule 11  . hydrocortisone (ANUSOL-HC) 25 MG suppository Place 1 suppository (25 mg total) rectally every 12 (twelve) hours. 12 suppository 1  . levothyroxine (SYNTHROID, LEVOTHROID) 175 MCG tablet TAKE 1 TABLET BY MOUTH  DAILY BEFORE BREAKFAST 90 tablet 3  . loperamide (IMODIUM) 2 MG capsule Take 2 capsules (4 mg total) by mouth every 4 (four) hours as needed for diarrhea or loose stools. 30 capsule 0  . lovastatin (MEVACOR) 20 MG tablet TAKE 1 TABLET BY MOUTH DAILY AT 6 PM. 90 tablet 1  . meloxicam (MOBIC) 7.5 MG tablet Take 1 tablet (7.5 mg total) by mouth daily. 30 tablet 0  . pantoprazole (PROTONIX) 40 MG tablet TAKE 1 TABLET 30 MINUTES BEFORE BREAKFAST AND SUPPER 180 tablet 1  . pramoxine-hydrocortisone (ANALPRAM HC) cream Apply 1 application topically 2 (two) times daily as needed. 90 g 2  .  ranitidine (ZANTAC) 300 MG tablet Take 1 tablet (300 mg total) by mouth at bedtime. 90 tablet 2  . saccharomyces boulardii (FLORASTOR) 250 MG capsule Take 1 capsule (250 mg total) by mouth 2 (two) times daily. 60 capsule 1  . thiamine 100 MG tablet Take 100 mg by mouth daily.      . calcium gluconate 650 MG tablet Take 1 tablet (650 mg total) by mouth daily. 30 tablet 2  Current Facility-Administered Medications on File Prior to Visit  Medication Dose Route Frequency Provider Last Rate Last Dose  . acetaminophen (TYLENOL) tablet 1,000 mg  1,000 mg Oral Once Amada Kingfisher, MD        Allergies  Allergen Reactions  . Aspirin Other (See Comments)    REACTION: Gi Intolerance/ Burning in stomach  . Codeine Itching  . Trazodone And Nefazodone     "sick"  . Flagyl [Metronidazole] Rash  . Iodine Itching    Allergic to IVP dye  . Morphine And Related Itching    Family History  Problem Relation Age of Onset  . Esophageal cancer Son     deceased  . Diabetes Mother   . Heart disease Mother   . Kidney disease Sister   . Diabetes Father   . Hypertension Father   . Kidney disease Brother     x 3  . Colon cancer Paternal Uncle     BP 122/84   Pulse 94   Ht 5\' 5"  (1.651 m)   Wt 212 lb (96.2 kg)   SpO2 99%   BMI 35.28 kg/m    Review of Systems Denies fever.      Objective:   Physical Exam VITAL SIGNS:  See vs page.   GENERAL: no distress.  Ant abd: a total of 4 vesicles, with no drainage.  Erythema is resolved.        Assessment & Plan:  Rash: persistent--? Zoster despite vaccination hx Patient is advised the following: Patient Instructions  I have sent a prescription to your pharmacy, for a medication against shingles.   I hope you feel better soon.  If you don't feel better by next week, please call back.  Please call sooner if you get worse.

## 2016-06-22 NOTE — Telephone Encounter (Signed)
I need to see the rash again.  Ov today at 4:15

## 2016-06-22 NOTE — Telephone Encounter (Signed)
Patient notified and voiced ok with coming at 4:15. Patient added.

## 2016-06-22 NOTE — Telephone Encounter (Signed)
Blisters are not cleared up and she is hurting on her right side. Antibiotics are complete. Please advise

## 2016-06-22 NOTE — Patient Instructions (Signed)
I have sent a prescription to your pharmacy, for a medication against shingles.   I hope you feel better soon.  If you don't feel better by next week, please call back.  Please call sooner if you get worse.

## 2016-06-22 NOTE — Telephone Encounter (Signed)
See message and please advise, Thanks!  

## 2016-06-24 ENCOUNTER — Other Ambulatory Visit: Payer: Self-pay

## 2016-06-24 MED ORDER — LOVASTATIN 20 MG PO TABS: ORAL_TABLET | ORAL | 1 refills | Status: DC

## 2016-07-13 ENCOUNTER — Encounter: Payer: Self-pay | Admitting: Endocrinology

## 2016-07-13 DIAGNOSIS — K912 Postsurgical malabsorption, not elsewhere classified: Secondary | ICD-10-CM | POA: Diagnosis not present

## 2016-07-13 DIAGNOSIS — I10 Essential (primary) hypertension: Secondary | ICD-10-CM | POA: Diagnosis not present

## 2016-07-13 DIAGNOSIS — E876 Hypokalemia: Secondary | ICD-10-CM | POA: Diagnosis not present

## 2016-07-20 DIAGNOSIS — M1712 Unilateral primary osteoarthritis, left knee: Secondary | ICD-10-CM | POA: Diagnosis not present

## 2016-08-08 ENCOUNTER — Other Ambulatory Visit: Payer: Self-pay | Admitting: Gastroenterology

## 2016-08-14 ENCOUNTER — Other Ambulatory Visit: Payer: Self-pay | Admitting: Endocrinology

## 2016-08-18 ENCOUNTER — Other Ambulatory Visit (HOSPITAL_BASED_OUTPATIENT_CLINIC_OR_DEPARTMENT_OTHER): Payer: Medicare Other

## 2016-08-18 ENCOUNTER — Encounter: Payer: Self-pay | Admitting: Hematology and Oncology

## 2016-08-18 ENCOUNTER — Ambulatory Visit (HOSPITAL_BASED_OUTPATIENT_CLINIC_OR_DEPARTMENT_OTHER): Payer: Medicare Other | Admitting: Hematology and Oncology

## 2016-08-18 ENCOUNTER — Other Ambulatory Visit: Payer: Self-pay | Admitting: Endocrinology

## 2016-08-18 DIAGNOSIS — S301XXA Contusion of abdominal wall, initial encounter: Secondary | ICD-10-CM

## 2016-08-18 DIAGNOSIS — C50312 Malignant neoplasm of lower-inner quadrant of left female breast: Secondary | ICD-10-CM

## 2016-08-18 DIAGNOSIS — Z853 Personal history of malignant neoplasm of breast: Secondary | ICD-10-CM

## 2016-08-18 LAB — COMPREHENSIVE METABOLIC PANEL
ALT: 30 U/L (ref 0–55)
ANION GAP: 8 meq/L (ref 3–11)
AST: 32 U/L (ref 5–34)
Albumin: 3.8 g/dL (ref 3.5–5.0)
Alkaline Phosphatase: 78 U/L (ref 40–150)
BILIRUBIN TOTAL: 0.66 mg/dL (ref 0.20–1.20)
BUN: 22.5 mg/dL (ref 7.0–26.0)
CALCIUM: 9.8 mg/dL (ref 8.4–10.4)
CHLORIDE: 108 meq/L (ref 98–109)
CO2: 26 meq/L (ref 22–29)
CREATININE: 1.1 mg/dL (ref 0.6–1.1)
EGFR: 59 mL/min/{1.73_m2} — ABNORMAL LOW (ref >90)
Glucose: 86 mg/dl (ref 70–140)
Potassium: 4.6 mEq/L (ref 3.5–5.1)
Sodium: 142 mEq/L (ref 136–145)
TOTAL PROTEIN: 7.4 g/dL (ref 6.4–8.3)

## 2016-08-18 LAB — CBC WITH DIFFERENTIAL/PLATELET
BASO%: 0.3 % (ref 0.0–2.0)
Basophils Absolute: 0 10*3/uL (ref 0.0–0.1)
EOS%: 1.8 % (ref 0.0–7.0)
Eosinophils Absolute: 0.1 10*3/uL (ref 0.0–0.5)
HEMATOCRIT: 33.8 % — AB (ref 34.8–46.6)
HGB: 11.2 g/dL — ABNORMAL LOW (ref 11.6–15.9)
LYMPH#: 1.4 10*3/uL (ref 0.9–3.3)
LYMPH%: 40.8 % (ref 14.0–49.7)
MCH: 31 pg (ref 25.1–34.0)
MCHC: 33.1 g/dL (ref 31.5–36.0)
MCV: 93.8 fL (ref 79.5–101.0)
MONO#: 0.4 10*3/uL (ref 0.1–0.9)
MONO%: 10.3 % (ref 0.0–14.0)
NEUT%: 46.8 % (ref 38.4–76.8)
NEUTROS ABS: 1.6 10*3/uL (ref 1.5–6.5)
PLATELETS: 242 10*3/uL (ref 145–400)
RBC: 3.6 10*6/uL — ABNORMAL LOW (ref 3.70–5.45)
RDW: 13.7 % (ref 11.2–14.5)
WBC: 3.4 10*3/uL — AB (ref 3.9–10.3)

## 2016-08-18 MED ORDER — CLOTRIMAZOLE-BETAMETHASONE 1-0.05 % EX CREA: 1.0000 "application " | TOPICAL_CREAM | Freq: Two times a day (BID) | CUTANEOUS | 0 refills | Status: DC

## 2016-08-18 NOTE — Assessment & Plan Note (Signed)
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 Currently on observation  Breast cancer surveillance: 1. Breast and chest wall exam 08/18/2016 is normal 2. Mammogram 10/08/2015 is normal 3. Bone density test September 2015 showed a T score of -0.7 normal range  Elevated creatinine: I discussed with her about drinking more water and hydration.  Return to clinic in 1 year for follow-up with survivorship clinic.

## 2016-08-18 NOTE — Progress Notes (Signed)
Patient Care Team: Renato Shin, MD as PCP - General Sable Feil, MD as Attending Physician (Gastroenterology) Marcy Panning, MD (Hematology and Oncology) Fanny Skates, MD as Attending Physician (General Surgery) Jesusita Oka, RN as Registered Nurse Amada Kingfisher, MD (Inactive) as Consulting Physician (Hematology and Oncology) Sharyne Peach, MD as Consulting Physician (Ophthalmology)  DIAGNOSIS:  Encounter Diagnosis  Name Primary?  . Primary cancer of lower-inner quadrant of left female breast ([place])    CHIEF COMPLIANT: Surveillance of triple negative breast cancer  INTERVAL HISTORY: [name] is a 71 year old with above-mentioned history of left breast cancer who is currently on surveillance. Her husband passed away in Feb 08, 2023 and she is had a rough year. Her health has been fairly good. She denies any pain or discomfort. She had a skin lesion on the abdominal wall which started off as a blood blister but then once it burst it causes bruising and this bruising appears to be steadily increasing. It has a raised edge.  REVIEW OF SYSTEMS:   Constitutional: Denies fevers, chills or abnormal weight loss Eyes: Denies blurriness of vision Ears, nose, mouth, throat, and face: Denies mucositis or sore throat Respiratory: Denies cough, dyspnea or wheezes Cardiovascular: Denies palpitation, chest discomfort Gastrointestinal:  Scars from prior surgeries Skin: Bruise on the abdominal wall Lymphatics: Denies new lymphadenopathy or easy bruising Neurological:Denies numbness, tingling or new weaknesses Behavioral/Psych: Mood is stable, no new changes  Extremities: No lower extremity edema Breast:  denies any pain or lumps or nodules in either breasts All other systems were reviewed with the patient and are negative.  I have reviewed the past medical history, past surgical history, social history and family history with the patient and they are unchanged from previous  note.  ALLERGIES:  is allergic to aspirin; codeine; trazodone and nefazodone; flagyl [metronidazole]; iodine; and morphine and related.  MEDICATIONS:  Current Outpatient Prescriptions  Medication Sig Dispense Refill  . allopurinol (ZYLOPRIM) 100 MG tablet TAKE 1 TABLET BY MOUTH  DAILY 90 tablet 3  . aMILoride (MIDAMOR) 5 MG tablet Take 5 mg by mouth 2 (two) times daily.   11  . calcium carbonate (TUMS - DOSED IN MG ELEMENTAL CALCIUM) 500 MG chewable tablet Chew 1 tablet by mouth as needed for indigestion or heartburn.    . Calcium Carbonate-Vitamin D (CALCIUM-VITAMIN D) 600-200 MG-UNIT CAPS Take 1 capsule by mouth daily.      . calcium gluconate 650 MG tablet Take 1 tablet (650 mg total) by mouth daily. 30 tablet 2  . cyanocobalamin (,VITAMIN B-12,) 1000 MCG/ML injection Inject 1,000 mcg into the muscle every 30 (thirty) days.     Marland Kitchen dicyclomine (BENTYL) 10 MG capsule Take 1 capsule (10 mg total) by mouth 3 (three) times daily before meals. 90 capsule 11  . ferrous sulfate 325 (65 FE) MG tablet Take 325 mg by mouth daily with breakfast.    . fluorometholone (FML) 0.1 % ophthalmic suspension Place 1 drop into both eyes 2 (two) times daily as needed (Dry Eyes).     . gabapentin (NEURONTIN) 300 MG capsule Take 2 capsules (600 mg total) by mouth 2 (two) times daily. 120 capsule 11  . hydrocortisone (ANUSOL-HC) 25 MG suppository Place 1 suppository (25 mg total) rectally every 12 (twelve) hours. 12 suppository 1  . levothyroxine (SYNTHROID, LEVOTHROID) 175 MCG tablet TAKE 1 TABLET BY MOUTH  DAILY BEFORE BREAKFAST 90 tablet 3  . loperamide (IMODIUM) 2 MG capsule Take 2 capsules (4 mg total) by mouth every  4 (four) hours as needed for diarrhea or loose stools. 30 capsule 0  . lovastatin (MEVACOR) 20 MG tablet TAKE 1 TABLET BY MOUTH DAILY AT 6 PM. 90 tablet 1  . meloxicam (MOBIC) 7.5 MG tablet Take 1 tablet (7.5 mg total) by mouth daily. 30 tablet 0  . Menthol-Zinc Oxide (CALMOSEPTINE EX) Apply  topically.    . pantoprazole (PROTONIX) 40 MG tablet TAKE 1 TABLET BY MOUTH 30 MINUTES PRIOR TO BREAKFAST AND SUPPER 180 tablet 0  . pramoxine-hydrocortisone (ANALPRAM HC) cream Apply 1 application topically 2 (two) times daily as needed. 90 g 2  . ranitidine (ZANTAC) 300 MG tablet Take 1 tablet (300 mg total) by mouth at bedtime. 90 tablet 2  . saccharomyces boulardii (FLORASTOR) 250 MG capsule Take 1 capsule (250 mg total) by mouth 2 (two) times daily. 60 capsule 1  . thiamine 100 MG tablet Take 100 mg by mouth daily.      . valACYclovir (VALTREX) 1000 MG tablet Take 1 tablet (1,000 mg total) by mouth 3 (three) times daily. 21 tablet 0  . zolpidem (AMBIEN) 5 MG tablet TAKE 1 TABLET BY MOUTH AT BEDTIME AS NEEDED FOR SLEEP 30 tablet 0   No current facility-administered medications for this visit.    Facility-Administered Medications Ordered in Other Visits  Medication Dose Route Frequency Provider Last Rate Last Dose  . acetaminophen (TYLENOL) tablet 1,000 mg  1,000 mg Oral Once Amada Kingfisher, MD        PHYSICAL EXAMINATION: ECOG PERFORMANCE STATUS: 1 - Symptomatic but completely ambulatory  Vitals:   08/18/16 1044  BP: 120/88  Pulse: 78  Resp: 18  Temp: 97.4 F (36.3 C)   Filed Weights   08/18/16 1044  Weight: 212 lb 14.4 oz (96.6 kg)    GENERAL:alert, no distress and comfortable SKIN: skin color, texture, turgor are normal, rule out abdominal wall which is circular and it is at least 4 inches in diameter with a raised edge EYES: normal, Conjunctiva are pink and non-injected, sclera clear OROPHARYNX:no exudate, no erythema and lips, buccal mucosa, and tongue normal  NECK: supple, thyroid normal size, non-tender, without nodularity LYMPH:  no palpable lymphadenopathy in the cervical, axillary or inguinal LUNGS: clear to auscultation and percussion with normal breathing effort HEART: regular rate & rhythm and no murmurs and no lower extremity edema ABDOMEN:Scars from prior  surgeries MUSCULOSKELETAL:no cyanosis of digits and no clubbing  NEURO: alert & oriented x 3 with fluent speech, no focal motor/sensory deficits EXTREMITIES: No lower extremity edema BREAST: No palpable masses or nodules in either right or left breasts. No palpable axillary supraclavicular or infraclavicular adenopathy no breast tenderness or nipple discharge. (exam performed in the presence of a chaperone)  LABORATORY DATA:  I have reviewed the data as listed   Chemistry      Component Value Date/Time   NA 141 10/29/2015 1338   NA 139 08/19/2015 1026   K 3.6 10/29/2015 1338   K 4.4 08/19/2015 1026   CL 107 10/29/2015 1338   CL 107 10/20/2012 1102   CO2 27 10/29/2015 1338   CO2 26 08/19/2015 1026   BUN 11 10/29/2015 1338   BUN 16.2 08/19/2015 1026   CREATININE 1.02 10/29/2015 1338   CREATININE 1.2 (H) 08/19/2015 1026      Component Value Date/Time   CALCIUM 9.2 10/29/2015 1338   CALCIUM 9.5 08/19/2015 1026   ALKPHOS 85 08/19/2015 1026   AST 21 08/19/2015 1026   ALT 15 08/19/2015 1026  BILITOT 0.69 08/19/2015 1026       Lab Results  Component Value Date   WBC 3.4 (L) 08/18/2016   HGB 11.2 (L) 08/18/2016   HCT 33.8 (L) 08/18/2016   MCV 93.8 08/18/2016   PLT 242 08/18/2016   NEUTROABS 1.6 08/18/2016    ASSESSMENT & PLAN:  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 Currently on observation  Breast cancer surveillance: 1. Breast and chest wall exam 08/18/2016 is normal 2. Mammogram 10/08/2015 is normal 3. Bone density test September 2015 showed a T score of -0.7 normal range  Patient's husband passed way in August  2017. Abdominal wall bruise with a raised edge: I am suspecting that this could be a fungal infection and prescribed her Chlortrimazole ointment. Return to clinic in 1 year for follow-up with survivorship clinic.  I spent 25 minutes talking to the patient of which more than half was spent in counseling and coordination of care.  No orders of the defined types were placed in this encounter.  The patient has a good understanding of the overall plan. she agrees with it. she will call with any problems that may develop before the next visit here.   Rulon Eisenmenger, MD 08/18/16

## 2016-08-20 ENCOUNTER — Ambulatory Visit (INDEPENDENT_AMBULATORY_CARE_PROVIDER_SITE_OTHER): Payer: Medicare Other | Admitting: Gastroenterology

## 2016-08-20 ENCOUNTER — Encounter: Payer: Self-pay | Admitting: Gastroenterology

## 2016-08-20 VITALS — BP 122/86 | HR 88 | Ht 64.5 in | Wt 214.5 lb

## 2016-08-20 DIAGNOSIS — R1310 Dysphagia, unspecified: Secondary | ICD-10-CM | POA: Diagnosis not present

## 2016-08-20 DIAGNOSIS — K219 Gastro-esophageal reflux disease without esophagitis: Secondary | ICD-10-CM

## 2016-08-20 DIAGNOSIS — K3184 Gastroparesis: Secondary | ICD-10-CM | POA: Diagnosis not present

## 2016-08-20 DIAGNOSIS — R109 Unspecified abdominal pain: Secondary | ICD-10-CM

## 2016-08-20 NOTE — Patient Instructions (Signed)
You have been scheduled for a Barium Esophogram at [facility] (1st floor of the hospital) on 09/04/16 at 10:30am. Please arrive 15 minutes prior to your appointment for registration. Make certain not to have anything to eat or drink 6 hours prior to your test. If you need to reschedule for any reason, please contact radiology at [phone] to do so. __________________________________________________________________ A barium swallow is an examination that concentrates on views of the esophagus. This tends to be a double contrast exam (barium and two liquids which, when combined, create a gas to distend the wall of the oesophagus) or single contrast (non-ionic iodine based). The study is usually tailored to your symptoms so a good history is essential. Attention is paid during the study to the form, structure and configuration of the esophagus, looking for functional disorders (such as aspiration, dysphagia, achalasia, motility and reflux) EXAMINATION You may be asked to change into a gown, depending on the type of swallow being performed. A radiologist and radiographer will perform the procedure. The radiologist will advise you of the type of contrast selected for your procedure and direct you during the exam. You will be asked to stand, sit or lie in several different positions and to hold a small amount of fluid in your mouth before being asked to swallow while the imaging is performed .In some instances you may be asked to swallow barium coated marshmallows to assess the motility of a solid food bolus. The exam can be recorded as a digital or video fluoroscopy procedure. POST PROCEDURE It will take 1-2 days for the barium to pass through your system. To facilitate this, it is important, unless otherwise directed, to increase your fluids for the next 24-48hrs and to resume your normal diet.  This test typically takes about 30 minutes to  perform. _________________________________________________________________________  Normal BMI (Body Mass Index- based on height and weight) is between 23 and 30. Your BMI today is Body mass index is 36.25 kg/m. Marland Kitchen Please consider follow up  regarding your BMI with your Primary Care Provider.  Thank you for choosing me and [facility].  Pricilla Riffle. Dagoberto Ligas., MD., [facility]

## 2016-08-20 NOTE — Progress Notes (Signed)
    History of Present Illness: This is a 71 year old female with right sided abdominal pain for 2-3 weeks that responded to Advil and Tylenol and has now resolved. Has chronic GERD, gastroparesis complaints-unchanged. Notes intermittent dysphagia. Last EGD in 2015 did not show a stricture. Chronic diarrhea is stable.   Current Medications, Allergies, Past Medical History, Past Surgical History, Family History and Social History were reviewed in [employer] record.  Physical Exam: General: Well developed, well nourished, no acute distress Head: Normocephalic and atraumatic Eyes:  sclerae anicteric, EOMI Ears: Normal auditory acuity Mouth: No deformity or lesions Lungs: Clear throughout to auscultation Heart: Regular rate and rhythm; no murmurs, rubs or bruits Abdomen: Soft, non tender and non distended. No masses, hepatosplenomegaly or hernias noted. Normal Bowel sounds Musculoskeletal: Symmetrical with no gross deformities  Pulses:  Normal pulses noted Extremities: No clubbing, cyanosis, edema or deformities noted Neurological: Alert oriented x 4, grossly nonfocal Psychological:  Alert and cooperative. Normal mood and affect  Assessment and Recommendations:  1. GERD. Continue pantoprazole 40 mg po bid and ranitidine 300 mg hs. Antireflux measures and bedblocks. REV in 1 year.   2. Gastroparesis. Follow diet.   3. Right sided abdominal pain-resolved. Etiology unclear, musculoskeletal likely. No further evaluation at this time.   4. Dysphagia. R/O stricture. Schedule Barium esophagram.   5. Bile salt diarrhea. History of presumed SIBO. Possible IBS. Diarrhea is stable. Bentyl 10 mg tid ac. Florastor bid. Imodium prn.

## 2016-08-31 ENCOUNTER — Other Ambulatory Visit: Payer: Self-pay | Admitting: Hematology and Oncology

## 2016-08-31 DIAGNOSIS — Z853 Personal history of malignant neoplasm of breast: Secondary | ICD-10-CM

## 2016-09-04 ENCOUNTER — Ambulatory Visit (HOSPITAL_COMMUNITY)
Admission: RE | Admit: 2016-09-04 | Discharge: 2016-09-04 | Disposition: A | Discharge status: Home / Self Care | Payer: Medicare Other | Source: Ambulatory Visit | Attending: Gastroenterology | Admitting: Gastroenterology

## 2016-09-04 DIAGNOSIS — K219 Gastro-esophageal reflux disease without esophagitis: Secondary | ICD-10-CM | POA: Diagnosis not present

## 2016-09-04 DIAGNOSIS — K449 Diaphragmatic hernia without obstruction or gangrene: Secondary | ICD-10-CM | POA: Insufficient documentation

## 2016-09-04 DIAGNOSIS — R1310 Dysphagia, unspecified: Secondary | ICD-10-CM | POA: Insufficient documentation

## 2016-09-07 ENCOUNTER — Encounter: Payer: Self-pay | Admitting: Endocrinology

## 2016-09-07 ENCOUNTER — Ambulatory Visit (INDEPENDENT_AMBULATORY_CARE_PROVIDER_SITE_OTHER): Payer: Medicare Other | Admitting: Endocrinology

## 2016-09-07 DIAGNOSIS — E876 Hypokalemia: Secondary | ICD-10-CM | POA: Diagnosis not present

## 2016-09-07 DIAGNOSIS — D509 Iron deficiency anemia, unspecified: Secondary | ICD-10-CM | POA: Diagnosis not present

## 2016-09-07 DIAGNOSIS — E538 Deficiency of other specified B group vitamins: Secondary | ICD-10-CM

## 2016-09-07 LAB — CBC WITH DIFFERENTIAL/PLATELET
BASOS ABS: 0 10*3/uL (ref 0.0–0.1)
Basophils Relative: 0.2 % (ref 0.0–3.0)
EOS ABS: 0 10*3/uL (ref 0.0–0.7)
Eosinophils Relative: 0.5 % (ref 0.0–5.0)
HCT: 34 % — ABNORMAL LOW (ref 36.0–46.0)
Hemoglobin: 11.3 g/dL — ABNORMAL LOW (ref 12.0–15.0)
LYMPHS ABS: 1.3 10*3/uL (ref 0.7–4.0)
LYMPHS PCT: 31.1 % (ref 12.0–46.0)
MCHC: 33.1 g/dL (ref 30.0–36.0)
MCV: 92.5 fl (ref 78.0–100.0)
MONOS PCT: 8.1 % (ref 3.0–12.0)
Monocytes Absolute: 0.3 10*3/uL (ref 0.1–1.0)
NEUTROS ABS: 2.4 10*3/uL (ref 1.4–7.7)
NEUTROS PCT: 60.1 % (ref 43.0–77.0)
PLATELETS: 259 10*3/uL (ref 150.0–400.0)
RBC: 3.67 Mil/uL — AB (ref 3.87–5.11)
RDW: 13.4 % (ref 11.5–15.5)
WBC: 4 10*3/uL (ref 4.0–10.5)

## 2016-09-07 LAB — BASIC METABOLIC PANEL
BUN: 23 mg/dL (ref 6–23)
CALCIUM: 9.6 mg/dL (ref 8.4–10.5)
CO2: 27 meq/L (ref 19–32)
CREATININE: 1.05 mg/dL (ref 0.40–1.20)
Chloride: 107 mEq/L (ref 96–112)
GFR: 66.17 mL/min (ref >60.00)
Glucose, Bld: 90 mg/dL (ref 70–99)
Potassium: 4.5 mEq/L (ref 3.5–5.1)
SODIUM: 140 meq/L (ref 135–145)

## 2016-09-07 LAB — VITAMIN D 25 HYDROXY (VIT D DEFICIENCY, FRACTURES): VITD: 33.46 ng/mL (ref 30.00–100.00)

## 2016-09-07 LAB — PHOSPHORUS: Phosphorus: 3.3 mg/dL (ref 2.3–4.6)

## 2016-09-07 LAB — IBC PANEL
IRON: 83 ug/dL (ref 42–145)
SATURATION RATIOS: 18.8 % — AB (ref 20.0–50.0)
TRANSFERRIN: 315 mg/dL (ref 212.0–360.0)

## 2016-09-07 LAB — MAGNESIUM: MAGNESIUM: 1.4 mg/dL — AB (ref 1.5–2.5)

## 2016-09-07 LAB — VITAMIN B12: VITAMIN B 12: 499 pg/mL (ref 211–911)

## 2016-09-07 NOTE — Patient Instructions (Signed)
blood tests are requested for you today.  We'll let you know about the results.   Please come back for a follow-up appointment in 6 months.  

## 2016-09-07 NOTE — Progress Notes (Signed)
Subjective:    Patient ID: [name], female    DOB: [birth year], 71 y.o.   MRN: [id number]  HPI Pt states 1 week of moderate cramps in the legs, and assoc pain.   She takes B-12 tabs as rx'ed. She takes Mg++ as rx'ed, but has not recently seen nephrol.  She takes fe, 1/day, for anemia She takes vit-D, 600 units/d, for vit-D deficiency Past Medical History:  Diagnosis Date  . ANEMIA, IRON DEFICIENCY 05/08/2009  . Angina   . ASYMPTOMATIC POSTMENOPAUSAL STATUS 10/11/2008  . Blood transfusion   . Blood transfusion without reported diagnosis   . Breast cancer ([place]) 09/29/11   invasive grade III ductal ca,assoc high grade dcis,ER/PR=neg  . C. difficile colitis   . Diverticulosis of colon (without mention of hemorrhage)   . Esophageal reflux 06/12/2008  . Gastroparesis   . GOITER, MULTINODULAR 04/02/2009  . Gout, unspecified   . H/O hiatal hernia   . History of lower GI bleeding   . History of radiation therapy 02/08/12-03/25/12   left breast,total 61gy  . Hypokalemia 05/11/2013  . Hypomagnesemia   . HYPOTHYROIDISM, POST-RADIATION 08/13/2009  . Internal hemorrhoids without mention of complication   . Kidney stones    "several"  . Leukopenia   . Migraines   . Obesity   . Osteoarthrosis, unspecified whether generalized or localized, unspecified site   . Other and unspecified hyperlipidemia   . PONV (postoperative nausea and vomiting)   . PUD (peptic ulcer disease)   . Short bowel syndrome   . Shortness of breath on exertion    "sometimes"  . Stricture and stenosis of esophagus   . Thyrotoxicosis without mention of goiter or other cause, without mention of thyrotoxic crisis or storm   . Type II or unspecified type diabetes mellitus without mention of complication, not stated as uncontrolled    no med in years diet controled  . Unspecified essential hypertension   . UTI (urinary tract infection)   . Varicose veins   . VITAMIN B12 DEFICIENCY 08/30/2009    Past Surgical  History:  Procedure Laterality Date  . ABDOMINAL ADHESION SURGERY  1980's thru 1990's   "several"  . ABDOMINAL HYSTERECTOMY  1970's   with BSO  . BREAST BIOPSY  08/13/11   left breast lower inner quadrant  . BREAST LUMPECTOMY W/ NEEDLE LOCALIZATION  09/29/11   left  breast=lymph node,excision benign/ ER/PR=neg, her 2 Positive  . BUNIONECTOMY  1970's   bilateral  . CHOLECYSTECTOMY  1990's  . COLON SURGERY     "several surgeries for short bowel syndrome"  . COLONOSCOPY  2012   multiple   . DILATION AND CURETTAGE OF UTERUS    . ESOPHAGOGASTRODUODENOSCOPY  2011   multiple   . EYE SURGERY     "long time ago"  . FLEXIBLE SIGMOIDOSCOPY  2011   multiple   . KIDNEY STONE SURGERY  1990's   "tried to go up & get it but pushed it further up"  . LITHOTRIPSY     "4 or 5 times"  . MASTECTOMY W/ NODES PARTIAL  09/29/11   left  . PORT-A-CATH REMOVAL Right 12/19/2013   Procedure: MINOR REMOVAL PORT-A-CATH;  Surgeon: Adin Hector, MD;  Location: [facility];  Service: General;  Laterality: Right;  . PORTACATH PLACEMENT  09/29/2011   Procedure: INSERTION PORT-A-CATH;  Surgeon: Adin Hector, MD;  Location: [place] [facility];  Service: General;  Laterality: N/A;  . Thyroid Ultrasound  12/1994 and 12/1995  .  TOTAL KNEE ARTHROPLASTY Right 06/05/2015   Procedure: RIGHT TOTAL KNEE ARTHROPLASTY;  Surgeon: Ninetta Lights, MD;  Location: [facility];  Service: Orthopedics;  Laterality: Right;  . VEIN LIGATION AND STRIPPING  1980's   Right leg    Social History   Social History  . Marital status: Married    Spouse name: N/A  . Number of children: 2  . Years of education: N/A   Occupational History  . RETIRED    Social History Main Topics  . Smoking status: Former Smoker    Packs/day: 1.00    Years: 10.00    Types: Cigarettes    Quit date: 09/22/1985  . Smokeless tobacco: Never Used  . Alcohol use No     Comment: 09/29/11 "used to drink socially years ago"  . Drug use: No  . Sexual activity:  No     Comment: HRT x many yrs   Other Topics Concern  . Not on file   Social History Narrative   Pt gets regular exercise    Current Outpatient Prescriptions on File Prior to Visit  Medication Sig Dispense Refill  . allopurinol (ZYLOPRIM) 100 MG tablet TAKE 1 TABLET BY MOUTH  DAILY 90 tablet 3  . aMILoride (MIDAMOR) 5 MG tablet Take 5 mg by mouth 2 (two) times daily.   11  . calcium carbonate (TUMS - DOSED IN MG ELEMENTAL CALCIUM) 500 MG chewable tablet Chew 1 tablet by mouth as needed for indigestion or heartburn.    . Calcium Carbonate-Vitamin D (CALCIUM-VITAMIN D) 600-200 MG-UNIT CAPS Take 1 capsule by mouth daily.      Marland Kitchen dicyclomine (BENTYL) 10 MG capsule Take 1 capsule (10 mg total) by mouth 3 (three) times daily before meals. 90 capsule 11  . ferrous sulfate 325 (65 FE) MG tablet Take 325 mg by mouth daily with breakfast.    . fluorometholone (FML) 0.1 % ophthalmic suspension Place 1 drop into both eyes 2 (two) times daily as needed (Dry Eyes).     . gabapentin (NEURONTIN) 300 MG capsule Take 2 capsules (600 mg total) by mouth 2 (two) times daily. 120 capsule 11  . hydrocortisone (ANUSOL-HC) 25 MG suppository Place 1 suppository (25 mg total) rectally every 12 (twelve) hours. 12 suppository 1  . levothyroxine (SYNTHROID, LEVOTHROID) 175 MCG tablet TAKE 1 TABLET BY MOUTH  DAILY BEFORE BREAKFAST 90 tablet 3  . loperamide (IMODIUM) 2 MG capsule Take 2 capsules (4 mg total) by mouth every 4 (four) hours as needed for diarrhea or loose stools. 30 capsule 0  . lovastatin (MEVACOR) 20 MG tablet TAKE 1 TABLET BY MOUTH DAILY AT 6 PM. 90 tablet 1  . meloxicam (MOBIC) 7.5 MG tablet Take 1 tablet (7.5 mg total) by mouth daily. 30 tablet 0  . Menthol-Zinc Oxide (CALMOSEPTINE EX) Apply topically.    . pantoprazole (PROTONIX) 40 MG tablet TAKE 1 TABLET BY MOUTH 30 MINUTES PRIOR TO BREAKFAST AND SUPPER 180 tablet 0  . pramoxine-hydrocortisone (ANALPRAM HC) cream Apply 1 application topically 2  (two) times daily as needed. 90 g 2  . ranitidine (ZANTAC) 300 MG tablet Take 1 tablet (300 mg total) by mouth at bedtime. 90 tablet 2  . saccharomyces boulardii (FLORASTOR) 250 MG capsule Take 1 capsule (250 mg total) by mouth 2 (two) times daily. 60 capsule 1  . thiamine 100 MG tablet Take 100 mg by mouth daily.      . valACYclovir (VALTREX) 1000 MG tablet Take 1 tablet (1,000 mg total) by  mouth 3 (three) times daily. 21 tablet 0  . cyanocobalamin (,VITAMIN B-12,) 1000 MCG/ML injection Inject 1,000 mcg into the muscle every 30 (thirty) days.      Current Facility-Administered Medications on File Prior to Visit  Medication Dose Route Frequency Provider Last Rate Last Dose  . acetaminophen (TYLENOL) tablet 1,000 mg  1,000 mg Oral Once Amada Kingfisher, MD        Allergies  Allergen Reactions  . Aspirin Other (See Comments)    REACTION: Gi Intolerance/ Burning in stomach  . Codeine Itching  . Trazodone And Nefazodone     "sick"  . Flagyl [Metronidazole] Rash  . Iodine Itching    Allergic to IVP dye  . Morphine And Related Itching    Family History  Problem Relation Age of Onset  . Esophageal cancer Son     deceased  . Diabetes Mother   . Heart disease Mother   . Kidney disease Sister   . Diabetes Father   . Hypertension Father   . Kidney disease Brother     x 3  . Colon cancer Paternal Uncle     BP 132/82   Pulse 83   Ht 5' 4.5" (1.638 m)   Wt 212 lb (96.2 kg)   SpO2 97%   BMI 35.83 kg/m   Review of Systems No change in chronic bilat foot numbness. Denies BRBPR    Objective:   Physical Exam VITAL SIGNS:  See vs page GENERAL: no distress Pulses: dorsalis pedis intact bilat.   MSK: no deformity of the feet CV: no leg edema Skin:  no ulcer on the feet.  normal color and temp on the feet. Neuro: sensation is intact to touch on the feet, but decreased from normal.     B-12=normal Lab Results  Component Value Date   CREATININE 1.05 09/07/2016   BUN 23 09/07/2016     NA 140 09/07/2016   K 4.5 09/07/2016   CL 107 09/07/2016   CO2 27 09/07/2016   Lab Results  Component Value Date   PTH 73 (H) 09/07/2016   CALCIUM 9.4 09/07/2016   CALCIUM 9.6 09/07/2016   CAION 1.16 05/11/2013   PHOS 3.3 09/07/2016   25-OH vit-D=33 Mg++=1.4 Lab Results  Component Value Date   WBC 4.0 09/07/2016   HGB 11.3 (L) 09/07/2016   HCT 34.0 (L) 09/07/2016   MCV 92.5 09/07/2016   PLT 259.0 09/07/2016      Assessment & Plan:  B-12 deficiency: well-replaced on oral rx.   Hypomagnesemia, apparently renal: persistent. Vit-D deficiency: well-replaced. fe-deficiency anemia: persistent: increase iron to 1 pill, twice a day Please continue the same other medications.

## 2016-09-08 LAB — PTH, INTACT AND CALCIUM
CALCIUM: 9.4 mg/dL (ref 8.6–10.4)
PTH: 73 pg/mL — ABNORMAL HIGH (ref 14–64)

## 2016-09-11 ENCOUNTER — Other Ambulatory Visit: Payer: Self-pay | Admitting: Gastroenterology

## 2016-09-12 ENCOUNTER — Encounter (HOSPITAL_COMMUNITY): Payer: Self-pay

## 2016-09-12 ENCOUNTER — Emergency Department (HOSPITAL_COMMUNITY)
Admission: EM | Admit: 2016-09-12 | Discharge: 2016-09-12 | Disposition: A | Discharge status: Home / Self Care | Payer: Medicare Other | Attending: Emergency Medicine | Admitting: Emergency Medicine

## 2016-09-12 DIAGNOSIS — Z87891 Personal history of nicotine dependence: Secondary | ICD-10-CM | POA: Diagnosis not present

## 2016-09-12 DIAGNOSIS — K644 Residual hemorrhoidal skin tags: Secondary | ICD-10-CM | POA: Diagnosis not present

## 2016-09-12 DIAGNOSIS — I10 Essential (primary) hypertension: Secondary | ICD-10-CM | POA: Insufficient documentation

## 2016-09-12 DIAGNOSIS — E039 Hypothyroidism, unspecified: Secondary | ICD-10-CM | POA: Diagnosis not present

## 2016-09-12 DIAGNOSIS — Z853 Personal history of malignant neoplasm of breast: Secondary | ICD-10-CM | POA: Diagnosis not present

## 2016-09-12 DIAGNOSIS — K649 Unspecified hemorrhoids: Secondary | ICD-10-CM

## 2016-09-12 DIAGNOSIS — K6289 Other specified diseases of anus and rectum: Secondary | ICD-10-CM | POA: Diagnosis not present

## 2016-09-12 DIAGNOSIS — E119 Type 2 diabetes mellitus without complications: Secondary | ICD-10-CM | POA: Insufficient documentation

## 2016-09-12 MED ORDER — LIDOCAINE (ANORECTAL) 5 % EX GEL: CUTANEOUS | 0 refills | Status: DC

## 2016-09-12 NOTE — ED Triage Notes (Signed)
She c/o non-bleeding, recurrent hemorrhoids. She has been treated medically for same in the past. She denies fever/n/v/d.

## 2016-09-12 NOTE — Discharge Instructions (Signed)
Please use the lidocaine gel as prescribed. Please continue your medication you have at home for your hemorrhoid. Please call the surgery center on Monday for follow. Use sitz baths and apply slight pressure to the hemorrhoid to see if you can put it back into place. Liquid diet. Motrin and tylenol for pain.

## 2016-09-12 NOTE — ED Provider Notes (Signed)
[facility] DEPT Provider Note   CSN: [id number] Arrival date & time: 09/12/16  1100     History   Chief Complaint Chief Complaint  Patient presents with  . Hemorrhoids    HPI [name] is a 71 y.o. female.  HPI 71 year old African American female past medical history significant for chronic hemorrhoids presents to the ED today with complaints of hemorrhoid pain. Patient states that they have worsened over the past 2 days. She has been using her suppositories and creams that her primary care doctor gave her little relief. She is doing sitz bath at home with little relief. She has never seen a Psychologist, sport and exercise before. She denies any bleeding with the hemorrhoids. Denies any history of constipation. She denies any fever, chills, abdominal pain, nausea, vomiting, diarrhea, urinary symptoms, vaginal symptoms. Past Medical History:  Diagnosis Date  . ANEMIA, IRON DEFICIENCY 05/08/2009  . Angina   . ASYMPTOMATIC POSTMENOPAUSAL STATUS 10/11/2008  . Blood transfusion   . Blood transfusion without reported diagnosis   . Breast cancer ([place]) 09/29/11   invasive grade III ductal ca,assoc high grade dcis,ER/PR=neg  . C. difficile colitis   . Diverticulosis of colon (without mention of hemorrhage)   . Esophageal reflux 06/12/2008  . Gastroparesis   . GOITER, MULTINODULAR 04/02/2009  . Gout, unspecified   . H/O hiatal hernia   . History of lower GI bleeding   . History of radiation therapy 02/08/12-03/25/12   left breast,total 61gy  . Hypokalemia 05/11/2013  . Hypomagnesemia   . HYPOTHYROIDISM, POST-RADIATION 08/13/2009  . Internal hemorrhoids without mention of complication   . Kidney stones    "several"  . Leukopenia   . Migraines   . Obesity   . Osteoarthrosis, unspecified whether generalized or localized, unspecified site   . Other and unspecified hyperlipidemia   . PONV (postoperative nausea and vomiting)   . PUD (peptic ulcer disease)   . Short bowel syndrome   . Shortness of  breath on exertion    "sometimes"  . Stricture and stenosis of esophagus   . Thyrotoxicosis without mention of goiter or other cause, without mention of thyrotoxic crisis or storm   . Type II or unspecified type diabetes mellitus without mention of complication, not stated as uncontrolled    no med in years diet controled  . Unspecified essential hypertension   . UTI (urinary tract infection)   . Varicose veins   . VITAMIN B12 DEFICIENCY 08/30/2009    Patient Active Problem List   Diagnosis Date Noted  . 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 UTI (urinary tract infection) 11/27/2014  . UTI (lower urinary tract infection) 11/27/2014  . Acute cystitis with hematuria 11/13/2014  . Hemorrhoids, external 11/04/2014  . Wellness examination 10/03/2014  . Pain in joint, shoulder region 09/24/2014  . Cramp in limb 06/26/2014  . Rectal bleeding 05/02/2014  . External hemorrhoids 05/02/2014  . Hemorrhoids without complication 40/98/1191  . Pain in joint, lower leg 02/01/2014  . Vitamin D deficiency 01/16/2014  . Gastroparesis 09/14/2013  . Other dysphagia 09/04/2013  . Nausea alone 09/04/2013  . Iron deficiency anemia 08/28/2013  . Hypomagnesemia 05/11/2013  . Hypokalemia 05/11/2013  . Generalized weakness 05/11/2013  . Dehydration 11/25/2011  . Fatigue 11/16/2011  . Breast cancer ([place]) 09/29/2011  . Family history of breast cancer 08/19/2011  . Primary cancer of lower-inner quadrant of left female breast ([place]) 08/17/2011  . Neck pain 07/29/2011  . Routine general medical examination at a health care  facility 06/28/2011  . Edema 11/26/2010  . CLOSTRIDIUM DIFFICILE COLITIS 02/07/2010  . Blind loop syndrome 10/01/2009  . Abdominal pain 10/01/2009  . Vitamin B 12 deficiency 08/30/2009  . 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 Rash and nonspecific skin eruption 08/13/2009  . GOITER, MULTINODULAR 04/02/2009  . CONTACT DERMATITIS&OTHER ECZEMA DUE  UNSPEC CAUSE 02/20/2009  . URINARY CALCULUS 10/11/2008  . Asymptomatic menopausal state 10/11/2008  . BACK PAIN, CHRONIC 07/03/2008  . Esophageal reflux 06/12/2008  . Diarrhea 06/12/2008  . ABDOMINAL PAIN-RUQ 05/17/2008  . Diabetes ([place]) 12/10/2006  . Dyslipidemia 12/10/2006  . Gout 12/10/2006  . HYPERTENSION 12/10/2006  . DIVERTICULOSIS, COLON 12/10/2006  . OSTEOARTHRITIS 12/10/2006  . ESOPHAGEAL STRICTURE 08/23/2002  . HIATAL HERNIA 08/23/2002  . INTERNAL HEMORRHOIDS 02/02/2001    Past Surgical History:  Procedure Laterality Date  . ABDOMINAL ADHESION SURGERY  1980's thru 1990's   "several"  . ABDOMINAL HYSTERECTOMY  1970's   with BSO  . BREAST BIOPSY  08/13/11   left breast lower inner quadrant  . BREAST LUMPECTOMY W/ NEEDLE LOCALIZATION  09/29/11   left  breast=lymph node,excision benign/ ER/PR=neg, her 2 Positive  . BUNIONECTOMY  1970's   bilateral  . CHOLECYSTECTOMY  1990's  . COLON SURGERY     "several surgeries for short bowel syndrome"  . COLONOSCOPY  2012   multiple   . DILATION AND CURETTAGE OF UTERUS    . ESOPHAGOGASTRODUODENOSCOPY  2011   multiple   . EYE SURGERY     "long time ago"  . FLEXIBLE SIGMOIDOSCOPY  2011   multiple   . KIDNEY STONE SURGERY  1990's   "tried to go up & get it but pushed it further up"  . LITHOTRIPSY     "4 or 5 times"  . MASTECTOMY W/ NODES PARTIAL  09/29/11   left  . PORT-A-CATH REMOVAL Right 12/19/2013   Procedure: MINOR REMOVAL PORT-A-CATH;  Surgeon: Adin Hector, MD;  Location: [facility];  Service: General;  Laterality: Right;  . PORTACATH PLACEMENT  09/29/2011   Procedure: INSERTION PORT-A-CATH;  Surgeon: Adin Hector, MD;  Location: [facility];  Service: General;  Laterality: N/A;  . Thyroid Ultrasound  12/1994 and 12/1995  . TOTAL KNEE ARTHROPLASTY Right 06/05/2015   Procedure: RIGHT TOTAL KNEE ARTHROPLASTY;  Surgeon: Ninetta Lights, MD;  Location: [facility];  Service: Orthopedics;  Laterality: Right;  . VEIN  LIGATION AND STRIPPING  1980's   Right leg    OB History    No data available       Home Medications    Prior to Admission medications   Medication Sig Start Date End Date Taking? Authorizing Provider  allopurinol (ZYLOPRIM) 100 MG tablet TAKE 1 TABLET BY MOUTH  DAILY 05/06/16   Renato Shin, MD  aMILoride (MIDAMOR) 5 MG tablet Take 5 mg by mouth 2 (two) times daily.  06/11/14   Historical Provider, MD  calcium carbonate (TUMS - DOSED IN MG ELEMENTAL CALCIUM) 500 MG chewable tablet Chew 1 tablet by mouth as needed for indigestion or heartburn.    Historical Provider, MD  Calcium Carbonate-Vitamin D (CALCIUM-VITAMIN D) 600-200 MG-UNIT CAPS Take 1 capsule by mouth daily.      Historical Provider, MD  cyanocobalamin (,VITAMIN B-12,) 1000 MCG/ML injection Inject 1,000 mcg into the muscle every 30 (thirty) days.     Historical Provider, MD  Cyanocobalamin (VITAMIN B 12 PO) Take by mouth.    Historical Provider, MD  dicyclomine (BENTYL) 10 MG capsule  Take 1 capsule (10 mg total) by mouth 3 (three) times daily before meals. 10/16/15   Ladene Artist, MD  ferrous sulfate 325 (65 FE) MG tablet Take 325 mg by mouth daily with breakfast.    Historical Provider, MD  fluorometholone (FML) 0.1 % ophthalmic suspension Place 1 drop into both eyes 2 (two) times daily as needed ([place]).  03/14/13   Historical Provider, MD  gabapentin (NEURONTIN) 300 MG capsule Take 2 capsules (600 mg total) by mouth 2 (two) times daily. 02/10/16   Renato Shin, MD  hydrocortisone (ANUSOL-HC) 25 MG suppository Place 1 suppository (25 mg total) rectally every 12 (twelve) hours. 01/16/16   Ladene Artist, MD  levothyroxine (SYNTHROID, LEVOTHROID) 175 MCG tablet TAKE 1 TABLET BY MOUTH  DAILY BEFORE BREAKFAST 05/06/16   Renato Shin, MD  Lidocaine, Anorectal, 5 % GEL Apply a pea-sized amount to the affected area 3 times daily. 09/12/16   Doristine Devoid, PA-C  loperamide (IMODIUM) 2 MG capsule Take 2 capsules (4 mg total) by  mouth every 4 (four) hours as needed for diarrhea or loose stools. 01/16/14   Renato Shin, MD  lovastatin (MEVACOR) 20 MG tablet TAKE 1 TABLET BY MOUTH DAILY AT 6 PM. 06/24/16   Renato Shin, MD  meloxicam (MOBIC) 7.5 MG tablet Take 1 tablet (7.5 mg total) by mouth daily. 04/17/16   Billy Fischer, MD  Menthol-Zinc Oxide [facility] EX) Apply topically.    Historical Provider, MD  pantoprazole (PROTONIX) 40 MG tablet TAKE 1 TABLET BY MOUTH 30 MINUTES PRIOR TO BREAKFAST AND SUPPER 08/10/16   Ladene Artist, MD  pramoxine-hydrocortisone [facility]) cream Apply 1 application topically 2 (two) times daily as needed. 01/16/16   Ladene Artist, MD  ranitidine (ZANTAC) 300 MG tablet Take 1 tablet (300 mg total) by mouth at bedtime. 03/23/16   Ladene Artist, MD  ranitidine (ZANTAC) 300 MG tablet TAKE 1 TABLET BY MOUTH AT BEDTIME 09/11/16   Ladene Artist, MD  saccharomyces boulardii (FLORASTOR) 250 MG capsule Take 1 capsule (250 mg total) by mouth 2 (two) times daily. 05/09/15   Lori P Hvozdovic, PA-C  thiamine 100 MG tablet Take 100 mg by mouth daily.      Historical Provider, MD  valACYclovir (VALTREX) 1000 MG tablet Take 1 tablet (1,000 mg total) by mouth 3 (three) times daily. 06/22/16   Renato Shin, MD    Family History Family History  Problem Relation Age of Onset  . Esophageal cancer Son     deceased  . Diabetes Mother   . Heart disease Mother   . Kidney disease Sister   . Diabetes Father   . Hypertension Father   . Kidney disease Brother     x 3  . Colon cancer Paternal Uncle     Social History Social History  Substance Use Topics  . Smoking status: Former Smoker    Packs/day: 1.00    Years: 10.00    Types: Cigarettes    Quit date: 09/22/1985  . Smokeless tobacco: Never Used  . Alcohol use No     Comment: 09/29/11 "used to drink socially years ago"     Allergies   Aspirin; Codeine; Trazodone and nefazodone; Flagyl [metronidazole]; Iodine; and Morphine and  related   Review of Systems Review of Systems  Constitutional: Negative for chills and fever.  Gastrointestinal: Positive for rectal pain. Negative for abdominal pain, blood in stool, constipation, diarrhea, nausea and vomiting.  Genitourinary: Negative for dysuria, flank  pain, frequency, hematuria, urgency, vaginal bleeding and vaginal discharge.  Neurological: Negative for headaches.     Physical Exam Updated Vital Signs BP (!) 137/96 (BP Location: Right Arm)   Pulse 76   Temp 97.8 F (36.6 C) (Oral)   Resp 16   Ht 5\' 5"  (1.651 m)   Wt 96.7 kg   SpO2 99%   BMI 35.48 kg/m   Physical Exam  Constitutional: She is oriented to person, place, and time. She appears well-developed and well-nourished. No distress.  Eyes: Right eye exhibits no discharge. Left eye exhibits no discharge. No scleral icterus.  Pulmonary/Chest: No respiratory distress.  Abdominal: Soft. Bowel sounds are normal. There is no tenderness. There is no rebound and no guarding.  Genitourinary:  Genitourinary Comments: Chaperone present for exam. Patient tolerated without difficulties. External hemorrhoid noted that his skin color and tender to palpation. No erythema. Hemorrhoid does not appear thrombosed. Patient's belly tender to rectal exam with internal hemorrhoids noted. No bleeding noted. No signs of abscess. Hemorrhoid does not appear stringy related or infected.  Musculoskeletal: Normal range of motion.  Neurological: She is alert and oriented to person, place, and time.  Skin: Capillary refill takes less than 2 seconds. No pallor.  Nursing note and vitals reviewed.    ED Treatments / Results  Labs (all labs ordered are listed, but only abnormal results are displayed) Labs Reviewed - No data to display  EKG  EKG Interpretation None       Radiology No results found.  Procedures Procedures (including critical care time)  Medications Ordered in ED Medications - No data to  display   Initial Impression / Assessment and Plan / ED Course  I have reviewed the triage vital signs and the nursing notes.  Pertinent labs & imaging results that were available during my care of the patient were reviewed by me and considered in my medical decision making (see chart for details).     Hemorrhoids  Pt to ER for external hemorrhoids. History of chronic hemorrhoids. Hemorrhoid is not thrombosed or infected. No signs of rectal abscess. Did not feel that incision and drainage indicated at this time. Have given lidocaine jelly. Home care discussed including sitz baths 15 min TID. Dc w lidocaine ointment and recommendation for surgery f-u. Pt w normal VS and in NAD prior to dc. Pt seen and examined by Dr. Ralene Bathe who is agreeable to the above plan.    Final Clinical Impressions(s) / ED Diagnoses   Final diagnoses:  Hemorrhoids, unspecified hemorrhoid type    New Prescriptions Discharge Medication List as of 09/12/2016  2:14 PM    START taking these medications   Details  Lidocaine, Anorectal, 5 % GEL Apply a pea-sized amount to the affected area 3 times daily., Print         Doristine Devoid, PA-C 09/12/16 [place], MD 09/14/16 [phone]

## 2016-09-15 DIAGNOSIS — K644 Residual hemorrhoidal skin tags: Secondary | ICD-10-CM | POA: Diagnosis not present

## 2016-09-21 ENCOUNTER — Telehealth: Payer: Self-pay

## 2016-09-21 NOTE — Telephone Encounter (Signed)
Patient states she has been taking the medication and is not sure why it's not on her list. I informed patient of the potential side effects reglan can give such as tardive dyskinesia and described the symptoms to her. Patient states she is worried she will not be able to tolerate her reflux symptoms coming off the medication but currently does not have any symptoms. I told patient to try to stop the medication for a couple of weeks but remain on her Protonix daily and ranitidine at bedtime. Informed patient to call in a couple of weeks if her symptoms get worse.

## 2016-09-21 NOTE — Telephone Encounter (Signed)
We received refill request from patient's pharmacy for reglan 5 mg 1 tablet by mouth four times a day. I did not see at the last office note that you wanted patient to continue this medication for gastroparesis. Can I refill?

## 2016-09-21 NOTE — Telephone Encounter (Signed)
I don't see metoclopramide on her current med list.  It is preferable to remain off metoclopramide to avoid potential side effect of tardive dyskinesia. Please call her to see what symptoms are concerning her.

## 2016-09-21 NOTE — Telephone Encounter (Signed)
I agree and if in a couple week she metoclopramide was definitely helping her symptoms we can resume it.

## 2016-10-08 DIAGNOSIS — K644 Residual hemorrhoidal skin tags: Secondary | ICD-10-CM | POA: Diagnosis not present

## 2016-10-12 ENCOUNTER — Ambulatory Visit
Admission: RE | Admit: 2016-10-12 | Discharge: 2016-10-12 | Disposition: A | Discharge status: Home / Self Care | Payer: Medicare Other | Source: Ambulatory Visit | Attending: Hematology and Oncology | Admitting: Hematology and Oncology

## 2016-10-12 ENCOUNTER — Other Ambulatory Visit: Payer: Self-pay | Admitting: Endocrinology

## 2016-10-12 DIAGNOSIS — Z853 Personal history of malignant neoplasm of breast: Secondary | ICD-10-CM

## 2016-10-12 DIAGNOSIS — R928 Other abnormal and inconclusive findings on diagnostic imaging of breast: Secondary | ICD-10-CM | POA: Diagnosis not present

## 2016-10-12 HISTORY — DX: Personal history of antineoplastic chemotherapy: Z92.21

## 2016-10-12 HISTORY — DX: Personal history of irradiation: Z92.3

## 2016-10-20 ENCOUNTER — Other Ambulatory Visit: Payer: Self-pay | Admitting: Endocrinology

## 2016-11-10 ENCOUNTER — Other Ambulatory Visit: Payer: Self-pay | Admitting: General Surgery

## 2016-11-10 DIAGNOSIS — K643 Fourth degree hemorrhoids: Secondary | ICD-10-CM | POA: Diagnosis not present

## 2016-11-10 DIAGNOSIS — K649 Unspecified hemorrhoids: Secondary | ICD-10-CM | POA: Diagnosis not present

## 2016-11-10 HISTORY — PX: EXCISIONAL HEMORRHOIDECTOMY: SHX1541

## 2016-12-16 ENCOUNTER — Other Ambulatory Visit: Payer: Self-pay | Admitting: Endocrinology

## 2016-12-19 ENCOUNTER — Emergency Department (HOSPITAL_COMMUNITY)
Admission: EM | Admit: 2016-12-19 | Discharge: 2016-12-19 | Disposition: A | Discharge status: Home / Self Care | Payer: Medicare Other | Attending: Emergency Medicine | Admitting: Emergency Medicine

## 2016-12-19 ENCOUNTER — Encounter (HOSPITAL_COMMUNITY): Payer: Self-pay

## 2016-12-19 ENCOUNTER — Telehealth: Payer: Self-pay | Admitting: Gastroenterology

## 2016-12-19 DIAGNOSIS — Z87891 Personal history of nicotine dependence: Secondary | ICD-10-CM | POA: Insufficient documentation

## 2016-12-19 DIAGNOSIS — I10 Essential (primary) hypertension: Secondary | ICD-10-CM | POA: Insufficient documentation

## 2016-12-19 DIAGNOSIS — Z96651 Presence of right artificial knee joint: Secondary | ICD-10-CM | POA: Diagnosis not present

## 2016-12-19 DIAGNOSIS — E119 Type 2 diabetes mellitus without complications: Secondary | ICD-10-CM | POA: Diagnosis not present

## 2016-12-19 DIAGNOSIS — R197 Diarrhea, unspecified: Secondary | ICD-10-CM | POA: Insufficient documentation

## 2016-12-19 DIAGNOSIS — E039 Hypothyroidism, unspecified: Secondary | ICD-10-CM | POA: Insufficient documentation

## 2016-12-19 DIAGNOSIS — Z79899 Other long term (current) drug therapy: Secondary | ICD-10-CM | POA: Insufficient documentation

## 2016-12-19 LAB — URINALYSIS, ROUTINE W REFLEX MICROSCOPIC
Glucose, UA: NEGATIVE mg/dL
Ketones, ur: 5 mg/dL — AB
NITRITE: NEGATIVE
PH: 5 (ref 5.0–8.0)
Protein, ur: 30 mg/dL — AB
SPECIFIC GRAVITY, URINE: 1.027 (ref 1.005–1.030)

## 2016-12-19 LAB — COMPREHENSIVE METABOLIC PANEL
ALBUMIN: 3.7 g/dL (ref 3.5–5.0)
ALK PHOS: 73 U/L (ref 38–126)
ALT: 23 U/L (ref 14–54)
ANION GAP: 9 (ref 5–15)
AST: 27 U/L (ref 15–41)
BILIRUBIN TOTAL: 0.6 mg/dL (ref 0.3–1.2)
BUN: 17 mg/dL (ref 6–20)
CALCIUM: 9.5 mg/dL (ref 8.9–10.3)
CO2: 24 mmol/L (ref 22–32)
CREATININE: 1.22 mg/dL — AB (ref 0.44–1.00)
Chloride: 107 mmol/L (ref 101–111)
GFR calc Af Amer: 50 mL/min — ABNORMAL LOW (ref >60)
GFR calc non Af Amer: 43 mL/min — ABNORMAL LOW (ref >60)
GLUCOSE: 99 mg/dL (ref 65–99)
Potassium: 3.8 mmol/L (ref 3.5–5.1)
SODIUM: 140 mmol/L (ref 135–145)
TOTAL PROTEIN: 7.3 g/dL (ref 6.5–8.1)

## 2016-12-19 LAB — CBC
HCT: 32.9 % — ABNORMAL LOW (ref 36.0–46.0)
Hemoglobin: 10.6 g/dL — ABNORMAL LOW (ref 12.0–15.0)
MCH: 29.4 pg (ref 26.0–34.0)
MCHC: 32.2 g/dL (ref 30.0–36.0)
MCV: 91.4 fL (ref 78.0–100.0)
PLATELETS: 258 10*3/uL (ref 150–400)
RBC: 3.6 MIL/uL — ABNORMAL LOW (ref 3.87–5.11)
RDW: 13.7 % (ref 11.5–15.5)
WBC: 4.4 10*3/uL (ref 4.0–10.5)

## 2016-12-19 LAB — LIPASE, BLOOD: Lipase: 15 U/L (ref 11–51)

## 2016-12-19 MED ORDER — DIPHENOXYLATE-ATROPINE 2.5-0.025 MG PO TABS
1.0000 | ORAL_TABLET | Freq: Four times a day (QID) | ORAL | 0 refills | Status: DC | PRN
Start: 2016-12-19 — End: 2016-12-22

## 2016-12-19 MED ORDER — SODIUM CHLORIDE 0.9 % IV BOLUS (SEPSIS)
500.0000 mL | Freq: Once | INTRAVENOUS | Status: AC
  Administered 2016-12-19: 500 mL via INTRAVENOUS

## 2016-12-19 MED ORDER — SODIUM CHLORIDE 0.9 % IV SOLN
INTRAVENOUS | Status: DC
  Administered 2016-12-19: 20:00:00 via INTRAVENOUS

## 2016-12-19 MED ORDER — DIPHENOXYLATE-ATROPINE 2.5-0.025 MG PO TABS
1.0000 | ORAL_TABLET | Freq: Once | ORAL | Status: AC
  Administered 2016-12-19: 1 via ORAL
  Filled 2016-12-19: qty 1

## 2016-12-19 NOTE — Discharge Instructions (Signed)
Drink plenty of fluids.  Return here, if needed, for problems.

## 2016-12-19 NOTE — ED Notes (Signed)
Pt given PO fluids.

## 2016-12-19 NOTE — ED Notes (Signed)
MADE FIRST REQUEST FOR URINE SAMPLE 

## 2016-12-19 NOTE — Telephone Encounter (Signed)
Patient called - she reports high frequency diarrhea. She has a history of C diff remotely. She has been taking immodium with no relief. Tolerating liquids but not eating much. Concerned she is getting dehydrated. Recommend she come to the ER for labs and stool studies, needs to be ruled out for C diff. She agreed.

## 2016-12-19 NOTE — ED Notes (Signed)
ED Provider at bedside. 

## 2016-12-19 NOTE — ED Triage Notes (Signed)
Pt with diarrhea and abd pain since yesterday.

## 2016-12-19 NOTE — ED Provider Notes (Signed)
[facility] DEPT Provider Note   CSN: [id number] Arrival date & time: 12/19/16  1550     History   Chief Complaint Chief Complaint  Patient presents with  . Diarrhea  . Abdominal Pain    HPI [name] is a 71 y.o. female.  The patient presents for evaluation of diarrhea for 1-1/2 days.  She has had numerous episodes of green colored diarrhea, and also noticed some blood when wiping.  She had a hemorrhoidectomy, 1 month ago and reports that she did well postoperatively.  She has not been on antibiotics recently.  She has had some nausea without vomiting.  She denies fever, chills, cough, shortness of breath, weakness or dizziness.  No chronic gastrointestinal disorders.  There are no other known modifying factors.  HPI  Past Medical History:  Diagnosis Date  . ANEMIA, IRON DEFICIENCY 05/08/2009  . Angina   . ASYMPTOMATIC POSTMENOPAUSAL STATUS 10/11/2008  . Blood transfusion   . Blood transfusion without reported diagnosis   . Breast cancer ([place]) 09/29/11   invasive grade III ductal ca,assoc high grade dcis,ER/PR=neg  . C. difficile colitis   . Diverticulosis of colon (without mention of hemorrhage)   . Esophageal reflux 06/12/2008  . Gastroparesis   . GOITER, MULTINODULAR 04/02/2009  . Gout, unspecified   . H/O hiatal hernia   . History of lower GI bleeding   . History of radiation therapy 02/08/12-03/25/12   left breast,total 61gy  . Hypokalemia 05/11/2013  . Hypomagnesemia   . HYPOTHYROIDISM, POST-RADIATION 08/13/2009  . Internal hemorrhoids without mention of complication   . Kidney stones    "several"  . Leukopenia   . Migraines   . Obesity   . Osteoarthrosis, unspecified whether generalized or localized, unspecified site   . Other and unspecified hyperlipidemia   . Personal history of chemotherapy 2013  . Personal history of radiation therapy 2013   left  . PONV (postoperative nausea and vomiting)   . PUD (peptic ulcer disease)   . Short bowel  syndrome   . Shortness of breath on exertion    "sometimes"  . Stricture and stenosis of esophagus   . Thyrotoxicosis without mention of goiter or other cause, without mention of thyrotoxic crisis or storm   . Type II or unspecified type diabetes mellitus without mention of complication, not stated as uncontrolled    no med in years diet controled  . Unspecified essential hypertension   . UTI (urinary tract infection)   . Varicose veins   . VITAMIN B12 DEFICIENCY 08/30/2009    Patient Active Problem List   Diagnosis Date Noted  . 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 UTI (urinary tract infection) 11/27/2014  . UTI (lower urinary tract infection) 11/27/2014  . Acute cystitis with hematuria 11/13/2014  . Hemorrhoids, external 11/04/2014  . Wellness examination 10/03/2014  . Pain in joint, shoulder region 09/24/2014  . Cramp in limb 06/26/2014  . Rectal bleeding 05/02/2014  . External hemorrhoids 05/02/2014  . Hemorrhoids without complication 40/98/1191  . Pain in joint, lower leg 02/01/2014  . Vitamin D deficiency 01/16/2014  . Gastroparesis 09/14/2013  . Other dysphagia 09/04/2013  . Nausea alone 09/04/2013  . Iron deficiency anemia 08/28/2013  . Hypomagnesemia 05/11/2013  . Hypokalemia 05/11/2013  . Generalized weakness 05/11/2013  . Dehydration 11/25/2011  . Fatigue 11/16/2011  . Breast cancer ([place]) 09/29/2011  . Family history of breast cancer 08/19/2011  . Primary cancer of lower-inner quadrant of left female breast ([place]) 08/17/2011  .  Neck pain 07/29/2011  . Routine general medical examination at a health care facility 06/28/2011  . Edema 11/26/2010  . CLOSTRIDIUM DIFFICILE COLITIS 02/07/2010  . Blind loop syndrome 10/01/2009  . Abdominal pain 10/01/2009  . Vitamin B 12 deficiency 08/30/2009  . 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 Rash and nonspecific skin eruption 08/13/2009  . GOITER, MULTINODULAR 04/02/2009  . CONTACT  DERMATITIS&OTHER ECZEMA DUE UNSPEC CAUSE 02/20/2009  . URINARY CALCULUS 10/11/2008  . Asymptomatic menopausal state 10/11/2008  . BACK PAIN, CHRONIC 07/03/2008  . Esophageal reflux 06/12/2008  . Diarrhea 06/12/2008  . ABDOMINAL PAIN-RUQ 05/17/2008  . Diabetes ([place]) 12/10/2006  . Dyslipidemia 12/10/2006  . Gout 12/10/2006  . HYPERTENSION 12/10/2006  . DIVERTICULOSIS, COLON 12/10/2006  . OSTEOARTHRITIS 12/10/2006  . ESOPHAGEAL STRICTURE 08/23/2002  . HIATAL HERNIA 08/23/2002  . INTERNAL HEMORRHOIDS 02/02/2001    Past Surgical History:  Procedure Laterality Date  . ABDOMINAL ADHESION SURGERY  1980's thru 1990's   "several"  . ABDOMINAL HYSTERECTOMY  1970's   with BSO  . BREAST BIOPSY Left 08/13/11   left breast lower inner quadrant  . BREAST BIOPSY Right 1985   Rt exc bx, benign  . BREAST LUMPECTOMY W/ NEEDLE LOCALIZATION  09/29/11   left  breast=lymph node,excision benign/ ER/PR=neg, her 2 Positive  . BUNIONECTOMY  1970's   bilateral  . CHOLECYSTECTOMY  1990's  . COLON SURGERY     "several surgeries for short bowel syndrome"  . COLONOSCOPY  2012   multiple   . DILATION AND CURETTAGE OF UTERUS    . ESOPHAGOGASTRODUODENOSCOPY  2011   multiple   . EYE SURGERY     "long time ago"  . FLEXIBLE SIGMOIDOSCOPY  2011   multiple   . KIDNEY STONE SURGERY  1990's   "tried to go up & get it but pushed it further up"  . LITHOTRIPSY     "4 or 5 times"  . MASTECTOMY W/ NODES PARTIAL  09/29/11   left  . PORT-A-CATH REMOVAL Right 12/19/2013   Procedure: MINOR REMOVAL PORT-A-CATH;  Surgeon: Adin Hector, MD;  Location: [place];  Service: General;  Laterality: Right;  . PORTACATH PLACEMENT  09/29/2011   Procedure: INSERTION PORT-A-CATH;  Surgeon: Adin Hector, MD;  Location: [place];  Service: General;  Laterality: N/A;  . Thyroid Ultrasound  12/1994 and 12/1995  . TOTAL KNEE ARTHROPLASTY Right 06/05/2015   Procedure: RIGHT TOTAL KNEE ARTHROPLASTY;  Surgeon: Ninetta Lights, MD;  Location: [place];  Service: Orthopedics;  Laterality: Right;  . VEIN LIGATION AND STRIPPING  1980's   Right leg    OB History    No data available       Home Medications    Prior to Admission medications   Medication Sig Start Date End Date Taking? Authorizing Provider  allopurinol (ZYLOPRIM) 100 MG tablet TAKE 1 TABLET BY MOUTH  DAILY 05/06/16  Yes Renato Shin, MD  aMILoride (MIDAMOR) 5 MG tablet Take 5 mg by mouth 2 (two) times daily.  06/11/14  Yes [provider]  calcium carbonate (TUMS - DOSED IN MG ELEMENTAL CALCIUM) 500 MG chewable tablet Chew 1 tablet by mouth as needed for indigestion or heartburn.   Yes [provider]  Calcium Carbonate-Vitamin D (CALCIUM-VITAMIN D) 600-200 MG-UNIT CAPS Take 1 capsule by mouth daily.     Yes [provider]  Cyanocobalamin (VITAMIN B 12 PO) Take 1 tablet by mouth 2 (two) times daily.    Yes [provider]  dicyclomine (BENTYL) 10 MG capsule Take 1 capsule (10 mg total) by mouth 3 (three) times daily before meals. 10/16/15  Yes Ladene Artist, MD  ferrous sulfate 325 (65 FE) MG tablet Take 325 mg by mouth daily with breakfast.   Yes [provider]  gabapentin (NEURONTIN) 300 MG capsule Take 2 capsules (600 mg total) by mouth 2 (two) times daily. 02/10/16  Yes Renato Shin, MD  levothyroxine (SYNTHROID, LEVOTHROID) 175 MCG tablet TAKE 1 TABLET BY MOUTH  DAILY BEFORE BREAKFAST 05/06/16  Yes Renato Shin, MD  loperamide (IMODIUM) 2 MG capsule Take 2 capsules (4 mg total) by mouth every 4 (four) hours as needed for diarrhea or loose stools. 01/16/14  Yes Renato Shin, MD  lovastatin (MEVACOR) 20 MG tablet TAKE 1 TABLET BY MOUTH  DAILY AT 6 PM. 10/20/16  Yes Renato Shin, MD  Magnesium Chloride 64 MG TBEC Take 64 mg by mouth daily.   Yes [provider]  meloxicam (MOBIC) 7.5 MG tablet Take 1 tablet (7.5 mg total) by mouth daily. 04/17/16  Yes Kindl, Nelda Severe, MD  metoCLOPramide  (REGLAN) 5 MG tablet Take 5 mg by mouth daily. 09/19/16  Yes [provider]  oxyCODONE-acetaminophen (PERCOCET/ROXICET) 5-325 MG tablet Take 1 tablet by mouth daily as needed. 11/16/16  Yes [provider]  pantoprazole (PROTONIX) 40 MG tablet TAKE 1 TABLET BY MOUTH 30 MINUTES PRIOR TO BREAKFAST AND SUPPER 08/10/16  Yes Ladene Artist, MD  ranitidine (ZANTAC) 300 MG tablet Take 1 tablet (300 mg total) by mouth at bedtime. 03/23/16  Yes Ladene Artist, MD  saccharomyces boulardii (FLORASTOR) 250 MG capsule Take 1 capsule (250 mg total) by mouth 2 (two) times daily. 05/09/15  Yes Hvozdovic, Lori P, PA-C  thiamine 100 MG tablet Take 100 mg by mouth daily.     Yes [provider]  zolpidem (AMBIEN) 5 MG tablet TAKE 1 TABLET BY MOUTH AT BEDTIME AS NEEDED FOR SLEEP 12/16/16  Yes Renato Shin, MD  diphenoxylate-atropine (LOMOTIL) 2.5-0.025 MG tablet Take 1 tablet by mouth 4 (four) times daily as needed for diarrhea or loose stools. 12/19/16   Daleen Bo, MD  hydrocortisone (ANUSOL-HC) 25 MG suppository Place 1 suppository (25 mg total) rectally every 12 (twelve) hours. Patient not taking: Reported on 12/19/2016 01/16/16   Ladene Artist, MD  Lidocaine, Anorectal, 5 % GEL Apply a pea-sized amount to the affected area 3 times daily. Patient not taking: Reported on 12/19/2016 09/12/16   Ocie Cornfield T, PA-C  pramoxine-hydrocortisone [facility]) cream Apply 1 application topically 2 (two) times daily as needed. Patient not taking: Reported on 12/19/2016 01/16/16   Ladene Artist, MD  ranitidine (ZANTAC) 300 MG tablet TAKE 1 TABLET BY MOUTH AT BEDTIME Patient not taking: Reported on 12/19/2016 09/11/16   Ladene Artist, MD  valACYclovir (VALTREX) 1000 MG tablet Take 1 tablet (1,000 mg total) by mouth 3 (three) times daily. Patient not taking: Reported on 12/19/2016 06/22/16   Renato Shin, MD    Family History Family History  Problem Relation Age of Onset  . Esophageal  cancer Son        deceased  . Diabetes Mother   . Heart disease Mother   . Kidney disease Sister   . Diabetes Father   . Hypertension Father   . Kidney disease Brother        x 3  . Colon cancer Paternal Uncle   . Breast cancer Maternal Aunt   .  Breast cancer Paternal Aunt   . Breast cancer Cousin        Pt states she has 15+ cousins w/ Breast CA    Social History Social History  Substance Use Topics  . Smoking status: Former Smoker    Packs/day: 1.00    Years: 10.00    Types: Cigarettes    Quit date: 09/22/1985  . Smokeless tobacco: Never Used  . Alcohol use No     Comment: 09/29/11 "used to drink socially years ago"     Allergies   Aspirin; Trazodone and nefazodone; Flagyl [metronidazole]; Iodine; and Morphine and related   Review of Systems Review of Systems  All other systems reviewed and are negative.    Physical Exam Updated Vital Signs BP 131/90 (BP Location: Right Arm)   Pulse 67   Temp 98.1 F (36.7 C) (Oral)   Resp 18   SpO2 99%   Physical Exam  Constitutional: She is oriented to person, place, and time. She appears well-developed.  Elderly, frail  HENT:  Head: Normocephalic and atraumatic.  Eyes: Pupils are equal, round, and reactive to light. Conjunctivae and EOM are normal.  Neck: Normal range of motion and phonation normal. Neck supple.  Cardiovascular: Normal rate and regular rhythm.   Pulmonary/Chest: Effort normal and breath sounds normal. She exhibits no tenderness.  Abdominal: Soft. She exhibits no distension. There is no tenderness. There is no guarding.  Musculoskeletal: Normal range of motion.  Neurological: She is alert and oriented to person, place, and time. She exhibits normal muscle tone.  Skin: Skin is warm and dry.  Psychiatric: She has a normal mood and affect. Her behavior is normal. Judgment and thought content normal.  Nursing note and vitals reviewed.    ED Treatments / Results  Labs (all labs ordered are listed, but  only abnormal results are displayed) Labs Reviewed  COMPREHENSIVE METABOLIC PANEL - Abnormal; Notable for the following:       Result Value   Creatinine, Ser 1.22 (*)    GFR calc non Af Amer 43 (*)    GFR calc Af Amer 50 (*)    All other components within normal limits  CBC - Abnormal; Notable for the following:    RBC 3.60 (*)    Hemoglobin 10.6 (*)    HCT 32.9 (*)    All other components within normal limits  URINALYSIS, ROUTINE W REFLEX MICROSCOPIC - Abnormal; Notable for the following:    Color, Urine AMBER (*)    APPearance CLOUDY (*)    Hgb urine dipstick SMALL (*)    Bilirubin Urine SMALL (*)    Ketones, ur 5 (*)    Protein, ur 30 (*)    Leukocytes, UA MODERATE (*)    Bacteria, UA MANY (*)    Squamous Epithelial / LPF 6-30 (*)    Non Squamous Epithelial 0-5 (*)    All other components within normal limits  STOOL CULTURE  LIPASE, BLOOD    EKG  EKG Interpretation None       Radiology No results found.  Procedures Procedures (including critical care time)  Medications Ordered in ED Medications  0.9 %  sodium chloride infusion ( Intravenous Stopped 12/19/16 2046)  sodium chloride 0.9 % bolus 500 mL (0 mLs Intravenous Stopped 12/19/16 1944)  diphenoxylate-atropine (LOMOTIL) 2.5-0.025 MG per tablet 1 tablet (1 tablet Oral Given 12/19/16 1757)     Initial Impression / Assessment and Plan / ED Course  I have reviewed the triage vital signs  and the nursing notes.  Pertinent labs & imaging results that were available during my care of the patient were reviewed by me and considered in my medical decision making (see chart for details).      Patient Vitals for the past 24 hrs:  BP Temp Temp src Pulse Resp SpO2  12/19/16 1954 131/90 - - 67 18 99 %  12/19/16 1555 127/83 98.1 F (36.7 C) Oral 78 18 100 %    At discharge- reevaluation with update and discussion. After initial assessment and treatment, an updated evaluation reveals she feels better after single  dose of Lomotil.  She has not been able to produce a stool sample yet.  She feels good enough to go home at this time.  Findings discussed with patient and family member, all questions were answered. Cassidy Tabet L    Final Clinical Impressions(s) / ED Diagnoses   Final diagnoses:  Diarrhea, unspecified type   Nonspecific diarrhea, with reassuring evaluation and treatment course.  Doubt serious bacterial infection metabolic instability or impending vascular collapse.  Nursing Notes Reviewed/ Care Coordinated Applicable Imaging Reviewed Interpretation of Laboratory Data incorporated into ED treatment  The patient appears reasonably screened and/or stabilized for discharge and I doubt any other medical condition or other [facility] requiring further screening, evaluation, or treatment in the ED at this time prior to discharge.  Plan: Home Medications-continue usual medications; Home Treatments-push oral fluids and gradually advance diet from low fiber; return here if the recommended treatment, does not improve the symptoms; Recommended follow up-PCP checkup as needed   New Prescriptions Discharge Medication List as of 12/19/2016  8:35 PM    START taking these medications   Details  diphenoxylate-atropine (LOMOTIL) 2.5-0.025 MG tablet Take 1 tablet by mouth 4 (four) times daily as needed for diarrhea or loose stools., Starting Sat 12/19/2016, Print         Daleen Bo, MD 12/19/16 2106

## 2016-12-19 NOTE — ED Notes (Signed)
Pt requested to see physician. MD made aware

## 2016-12-21 ENCOUNTER — Telehealth: Payer: Self-pay | Admitting: Endocrinology

## 2016-12-21 NOTE — Telephone Encounter (Signed)
Pt added to schedule

## 2016-12-21 NOTE — Telephone Encounter (Signed)
Pt discharged from [facility] Saturday July 21st. Has diarrhea still and feeling sick. No appt avail with Dr. Loanne Drilling. Possible to work in?  Please advise.  Thank you,  -LL

## 2016-12-21 NOTE — Telephone Encounter (Signed)
2 PM tomorrow

## 2016-12-22 ENCOUNTER — Ambulatory Visit (INDEPENDENT_AMBULATORY_CARE_PROVIDER_SITE_OTHER): Payer: Medicare Other | Admitting: Endocrinology

## 2016-12-22 ENCOUNTER — Encounter: Payer: Self-pay | Admitting: Endocrinology

## 2016-12-22 VITALS — BP 122/82 | HR 71 | Wt 208.6 lb

## 2016-12-22 DIAGNOSIS — R197 Diarrhea, unspecified: Secondary | ICD-10-CM | POA: Diagnosis not present

## 2016-12-22 MED ORDER — DIPHENOXYLATE-ATROPINE 2.5-0.025 MG PO TABS: 1.0000 | ORAL_TABLET | Freq: Four times a day (QID) | ORAL | 1 refills | Status: DC | PRN

## 2016-12-22 MED ORDER — BUSPIRONE HCL 15 MG PO TABS: 15.0000 mg | ORAL_TABLET | Freq: Two times a day (BID) | ORAL | 11 refills | Status: DC

## 2016-12-22 NOTE — Progress Notes (Signed)
Subjective:    Patient ID: [name], female    DOB: 06/17/1945, 71 y.o.   MRN: [id number]  HPI Pt reports 5 days of slight cramps throughout the abd, and assoc diarrhea. She was seen in ER. Past Medical History:  Diagnosis Date  . ANEMIA, IRON DEFICIENCY 05/08/2009  . Angina   . ASYMPTOMATIC POSTMENOPAUSAL STATUS 10/11/2008  . Blood transfusion   . Blood transfusion without reported diagnosis   . Breast cancer ([place]) 09/29/11   invasive grade III ductal ca,assoc high grade dcis,ER/PR=neg  . C. difficile colitis   . Diverticulosis of colon (without mention of hemorrhage)   . Esophageal reflux 06/12/2008  . Gastroparesis   . GOITER, MULTINODULAR 04/02/2009  . Gout, unspecified   . H/O hiatal hernia   . History of lower GI bleeding   . History of radiation therapy 02/08/12-03/25/12   left breast,total 61gy  . Hypokalemia 05/11/2013  . Hypomagnesemia   . HYPOTHYROIDISM, POST-RADIATION 08/13/2009  . Internal hemorrhoids without mention of complication   . Kidney stones    "several"  . Leukopenia   . Migraines   . Obesity   . Osteoarthrosis, unspecified whether generalized or localized, unspecified site   . Other and unspecified hyperlipidemia   . Personal history of chemotherapy 2013  . Personal history of radiation therapy 2013   left  . PONV (postoperative nausea and vomiting)   . PUD (peptic ulcer disease)   . Short bowel syndrome   . Shortness of breath on exertion    "sometimes"  . Stricture and stenosis of esophagus   . Thyrotoxicosis without mention of goiter or other cause, without mention of thyrotoxic crisis or storm   . Type II or unspecified type diabetes mellitus without mention of complication, not stated as uncontrolled    no med in years diet controled  . Unspecified essential hypertension   . UTI (urinary tract infection)   . Varicose veins   . VITAMIN B12 DEFICIENCY 08/30/2009    Past Surgical History:  Procedure Laterality Date  . ABDOMINAL  ADHESION SURGERY  1980's thru 1990's   "several"  . ABDOMINAL HYSTERECTOMY  1970's   with BSO  . BREAST BIOPSY Left 08/13/11   left breast lower inner quadrant  . BREAST BIOPSY Right 1985   Rt exc bx, benign  . BREAST LUMPECTOMY W/ NEEDLE LOCALIZATION  09/29/11   left  breast=lymph node,excision benign/ ER/PR=neg, her 2 Positive  . BUNIONECTOMY  1970's   bilateral  . CHOLECYSTECTOMY  1990's  . COLON SURGERY     "several surgeries for short bowel syndrome"  . COLONOSCOPY  2012   multiple   . DILATION AND CURETTAGE OF UTERUS    . ESOPHAGOGASTRODUODENOSCOPY  2011   multiple   . EYE SURGERY     "long time ago"  . FLEXIBLE SIGMOIDOSCOPY  2011   multiple   . KIDNEY STONE SURGERY  1990's   "tried to go up & get it but pushed it further up"  . LITHOTRIPSY     "4 or 5 times"  . MASTECTOMY W/ NODES PARTIAL  09/29/11   left  . PORT-A-CATH REMOVAL Right 12/19/2013   Procedure: MINOR REMOVAL PORT-A-CATH;  Surgeon: Adin Hector, MD;  Location: [place];  Service: General;  Laterality: Right;  . PORTACATH PLACEMENT  09/29/2011   Procedure: INSERTION PORT-A-CATH;  Surgeon: Adin Hector, MD;  Location: [place];  Service: General;  Laterality: N/A;  . Thyroid Ultrasound  12/1994 and  12/1995  . TOTAL KNEE ARTHROPLASTY Right 06/05/2015   Procedure: RIGHT TOTAL KNEE ARTHROPLASTY;  Surgeon: Ninetta Lights, MD;  Location: [place];  Service: Orthopedics;  Laterality: Right;  . VEIN LIGATION AND STRIPPING  1980's   Right leg    Social History   Social History  . Marital status: Married    Spouse name: N/A  . Number of children: 2  . Years of education: N/A   Occupational History  . RETIRED    Social History Main Topics  . Smoking status: Former Smoker    Packs/day: 1.00    Years: 10.00    Types: Cigarettes    Quit date: 09/22/1985  . Smokeless tobacco: Never Used  . Alcohol use No     Comment: 09/29/11 "used to drink socially years ago"  . Drug use: No  . Sexual  activity: No     Comment: HRT x many yrs   Other Topics Concern  . Not on file   Social History Narrative   Pt gets regular exercise    Current Outpatient Prescriptions on File Prior to Visit  Medication Sig Dispense Refill  . allopurinol (ZYLOPRIM) 100 MG tablet TAKE 1 TABLET BY MOUTH  DAILY 90 tablet 3  . aMILoride (MIDAMOR) 5 MG tablet Take 5 mg by mouth 2 (two) times daily.   11  . calcium carbonate (TUMS - DOSED IN MG ELEMENTAL CALCIUM) 500 MG chewable tablet Chew 1 tablet by mouth as needed for indigestion or heartburn.    . Calcium Carbonate-Vitamin D (CALCIUM-VITAMIN D) 600-200 MG-UNIT CAPS Take 1 capsule by mouth daily.      . Cyanocobalamin (VITAMIN B 12 PO) Take 1 tablet by mouth 2 (two) times daily.     Marland Kitchen dicyclomine (BENTYL) 10 MG capsule Take 1 capsule (10 mg total) by mouth 3 (three) times daily before meals. 90 capsule 11  . ferrous sulfate 325 (65 FE) MG tablet Take 325 mg by mouth daily with breakfast.    . gabapentin (NEURONTIN) 300 MG capsule Take 2 capsules (600 mg total) by mouth 2 (two) times daily. 120 capsule 11  . hydrocortisone (ANUSOL-HC) 25 MG suppository Place 1 suppository (25 mg total) rectally every 12 (twelve) hours. 12 suppository 1  . levothyroxine (SYNTHROID, LEVOTHROID) 175 MCG tablet TAKE 1 TABLET BY MOUTH  DAILY BEFORE BREAKFAST 90 tablet 3  . Lidocaine, Anorectal, 5 % GEL Apply a pea-sized amount to the affected area 3 times daily. 30 g 0  . loperamide (IMODIUM) 2 MG capsule Take 2 capsules (4 mg total) by mouth every 4 (four) hours as needed for diarrhea or loose stools. 30 capsule 0  . lovastatin (MEVACOR) 20 MG tablet TAKE 1 TABLET BY MOUTH  DAILY AT 6 PM. 90 tablet 2  . Magnesium Chloride 64 MG TBEC Take 64 mg by mouth daily.    . metoCLOPramide (REGLAN) 5 MG tablet Take 5 mg by mouth daily.  2  . pantoprazole (PROTONIX) 40 MG tablet TAKE 1 TABLET BY MOUTH 30 MINUTES PRIOR TO BREAKFAST AND SUPPER 180 tablet 0  . pramoxine-hydrocortisone  (ANALPRAM HC) cream Apply 1 application topically 2 (two) times daily as needed. 90 g 2  . ranitidine (ZANTAC) 300 MG tablet Take 1 tablet (300 mg total) by mouth at bedtime. 90 tablet 2  . saccharomyces boulardii (FLORASTOR) 250 MG capsule Take 1 capsule (250 mg total) by mouth 2 (two) times daily. 60 capsule 1  . thiamine 100 MG tablet Take 100 mg by  mouth daily.      . valACYclovir (VALTREX) 1000 MG tablet Take 1 tablet (1,000 mg total) by mouth 3 (three) times daily. 21 tablet 0  . zolpidem (AMBIEN) 5 MG tablet TAKE 1 TABLET BY MOUTH AT BEDTIME AS NEEDED FOR SLEEP 30 tablet 0   Current Facility-Administered Medications on File Prior to Visit  Medication Dose Route Frequency Provider Last Rate Last Dose  . acetaminophen (TYLENOL) tablet 1,000 mg  1,000 mg Oral Once Amada Kingfisher, MD        Allergies  Allergen Reactions  . Aspirin Other (See Comments)    REACTION: Gi Intolerance/ Burning in stomach  . Trazodone And Nefazodone     "sick"  . Flagyl [Metronidazole] Rash  . Iodine Itching    Allergic to IVP dye  . Morphine And Related Itching    Family History  Problem Relation Age of Onset  . Esophageal cancer Son        deceased  . Diabetes Mother   . Heart disease Mother   . Kidney disease Sister   . Diabetes Father   . Hypertension Father   . Kidney disease Brother        x 3  . Colon cancer Paternal Uncle   . Breast cancer Maternal Aunt   . Breast cancer Paternal Aunt   . Breast cancer Cousin        Pt states she has 15+ cousins w/ Breast CA    BP 122/82   Pulse 71   Wt 208 lb 9.6 oz (94.6 kg)   SpO2 97%   BMI 34.71 kg/m      Review of Systems She suffered the death of a friend recently.  She feels this may be causing or worsening sxs.  She has slight BRBPR, but feels this may be due to anal irritation.      Objective:   Physical Exam VITAL SIGNS:  See vs page GENERAL: no distress ABDOMEN: abdomen is soft, nontender.  no hepatosplenomegaly.  not distended.   no hernia.       Assessment & Plan:  Diarrhea, chronic intermittent.  Anxiety: persistent.  this may contribute to diarrhea.   Patient Instructions  Please continue the same medications for the diarrhea.  Here is a refill of the lomotil, to take as needed.   Also, I have sent a prescription to your pharmacy, for the nerves--this one you take twice a day, rather than as needed.  Please continue to avoid the magnesium until the diarrhea improves.  Drink plenty of fluids.

## 2016-12-22 NOTE — Patient Instructions (Addendum)
Please continue the same medications for the diarrhea.  Here is a refill of the lomotil, to take as needed.   Also, I have sent a prescription to your pharmacy, for the nerves--this one you take twice a day, rather than as needed.  Please continue to avoid the magnesium until the diarrhea improves.  Drink plenty of fluids.

## 2016-12-28 ENCOUNTER — Other Ambulatory Visit: Payer: Self-pay

## 2016-12-28 MED ORDER — RANITIDINE HCL 300 MG PO TABS: 300.0000 mg | ORAL_TABLET | Freq: Every day | ORAL | 2 refills | Status: DC

## 2016-12-31 ENCOUNTER — Ambulatory Visit (INDEPENDENT_AMBULATORY_CARE_PROVIDER_SITE_OTHER): Payer: Medicare Other | Admitting: Physician Assistant

## 2016-12-31 ENCOUNTER — Encounter: Payer: Self-pay | Admitting: Physician Assistant

## 2016-12-31 ENCOUNTER — Other Ambulatory Visit (INDEPENDENT_AMBULATORY_CARE_PROVIDER_SITE_OTHER): Payer: Medicare Other

## 2016-12-31 VITALS — BP 128/84 | HR 76 | Ht 64.5 in | Wt 208.8 lb

## 2016-12-31 DIAGNOSIS — R1084 Generalized abdominal pain: Secondary | ICD-10-CM

## 2016-12-31 DIAGNOSIS — K219 Gastro-esophageal reflux disease without esophagitis: Secondary | ICD-10-CM

## 2016-12-31 DIAGNOSIS — R197 Diarrhea, unspecified: Secondary | ICD-10-CM | POA: Diagnosis not present

## 2016-12-31 DIAGNOSIS — R630 Anorexia: Secondary | ICD-10-CM

## 2016-12-31 LAB — CBC WITH DIFFERENTIAL/PLATELET
Basophils Absolute: 0 10*3/uL (ref 0.0–0.1)
Basophils Relative: 0.3 % (ref 0.0–3.0)
EOS PCT: 0.8 % (ref 0.0–5.0)
Eosinophils Absolute: 0 10*3/uL (ref 0.0–0.7)
HCT: 32.9 % — ABNORMAL LOW (ref 36.0–46.0)
Hemoglobin: 10.7 g/dL — ABNORMAL LOW (ref 12.0–15.0)
LYMPHS ABS: 1.5 10*3/uL (ref 0.7–4.0)
Lymphocytes Relative: 35.5 % (ref 12.0–46.0)
MCHC: 32.4 g/dL (ref 30.0–36.0)
MCV: 91.8 fl (ref 78.0–100.0)
MONO ABS: 0.3 10*3/uL (ref 0.1–1.0)
Monocytes Relative: 7.1 % (ref 3.0–12.0)
NEUTROS ABS: 2.4 10*3/uL (ref 1.4–7.7)
NEUTROS PCT: 56.3 % (ref 43.0–77.0)
PLATELETS: 282 10*3/uL (ref 150.0–400.0)
RBC: 3.59 Mil/uL — AB (ref 3.87–5.11)
RDW: 14 % (ref 11.5–15.5)
WBC: 4.2 10*3/uL (ref 4.0–10.5)

## 2016-12-31 LAB — BASIC METABOLIC PANEL
BUN: 17 mg/dL (ref 6–23)
CALCIUM: 9.6 mg/dL (ref 8.4–10.5)
CO2: 28 meq/L (ref 19–32)
CREATININE: 0.99 mg/dL (ref 0.40–1.20)
Chloride: 102 mEq/L (ref 96–112)
GFR: 70.76 mL/min (ref >60.00)
Glucose, Bld: 97 mg/dL (ref 70–99)
Potassium: 3.8 mEq/L (ref 3.5–5.1)
SODIUM: 137 meq/L (ref 135–145)

## 2016-12-31 MED ORDER — DICYCLOMINE HCL 10 MG PO CAPS: 10.0000 mg | ORAL_CAPSULE | Freq: Four times a day (QID) | ORAL | 6 refills | Status: DC

## 2016-12-31 MED ORDER — DIPHENOXYLATE-ATROPINE 2.5-0.025 MG PO TABS: 1.0000 | ORAL_TABLET | Freq: Four times a day (QID) | ORAL | 4 refills | Status: DC | PRN

## 2016-12-31 MED ORDER — PANTOPRAZOLE SODIUM 40 MG PO TBEC: DELAYED_RELEASE_TABLET | ORAL | 11 refills | Status: DC

## 2016-12-31 MED ORDER — DIPHENOXYLATE-ATROPINE 2.5-0.025 MG PO TABS: 1.0000 | ORAL_TABLET | Freq: Four times a day (QID) | ORAL | 1 refills | Status: DC | PRN

## 2016-12-31 MED ORDER — HYDROCODONE-ACETAMINOPHEN 5-325 MG PO TABS: 1.0000 | ORAL_TABLET | Freq: Four times a day (QID) | ORAL | 0 refills | Status: DC | PRN

## 2016-12-31 NOTE — Patient Instructions (Addendum)
We have sent the following medications to your pharmacy for you to pick up at your convenience: Protonix Lomotil Bentyl   We have given you a prescription for Vicodin to take to your pharmacy.  Your physician has requested that you go to the basement for lab work before leaving today.   If you are age 73 or older, your body mass index should be between 23-30. Your Body mass index is 35.29 kg/m. If this is out of the aforementioned range listed, please consider follow up with your Primary Care Provider.  If you are age 27 or younger, your body mass index should be between 19-25. Your Body mass index is 35.29 kg/m. If this is out of the aformentioned range listed, please consider follow up with your Primary Care Provider.

## 2016-12-31 NOTE — Progress Notes (Signed)
Reviewed and agree with initial management plan.  Nakina Spatz T. Jedadiah Abdallah, MD FACG 

## 2016-12-31 NOTE — Progress Notes (Signed)
Subjective:    Patient ID: [name], female    DOB: [birth year], 71 y.o.   MRN: [id number]  HPI  [name] is a pleasant 71 year old African-American female, former patient of Dr. Sharlett Iles, now established with [name] with a long GI history. She comes in today with primary complaint of severe diarrhea over the past month.  She has history of hypertension, previous breast cancer, osteoarthritis, gastroparesis, GERD, chronic abdominal pain and is status post multiple abdominal surgeries including hysterectomy, BSO, and cholecystectomy. She also had multiple prior small bowel obstructions and lysis of adhesions and is felt to have a component of short bowel and probable bacterial overgrowth. She is known to have diverticular disease and has had prior resection of her bowel for diverticulitis. She is also had previous C. difficile. Last EGD in 2015 showed grade a esophagitis and a small hiatal hernia. She had dilation done for symptoms of dysphagia without definite stricture noted. Gastric emptying scan in 2015 showed 71% retention at 2 hours Last colonoscopy May 2012 with anastomosis in the sigmoid colon. Random biopsies were done to rule out microscopic colitis and these were negative. Patient has used Lomotil on a when necessary basis for several years and generally this is effective for bouts of diarrhea. He has a prescription for dicyclomine which she uses as needed. She says that she has been taking Lomotil 4 times daily over the past several weeks with absolutely no benefit. Patient had hemorrhoidectomy done in June 2018 and says a week or 2 after that she started having diarrhea which has persisted since. She says this is much worse than any of her chronic symptoms with up to 10 bowel movements during the day and says she is usually up 3-4 times at night with diarrhea as well stools are loose to watery. No particular odor. She's not noticed any blood. She has had abdominal cramping and  pain. No fever chills or sweats. Appetite has been poor and she has had some associated nausea. No recent antibiotics that she can remember She had ER visit on 12/19/2016 because of the diarrhea and had baseline labs done at that time to rule out volume depletion but did not have any stool studies ordered.   Review of Systems Pertinent positive and negative review of systems were noted in the above HPI section.  All other review of systems was otherwise negative.  Outpatient Encounter Prescriptions as of 12/31/2016  Medication Sig  . allopurinol (ZYLOPRIM) 100 MG tablet TAKE 1 TABLET BY MOUTH  DAILY  . aMILoride (MIDAMOR) 5 MG tablet Take 5 mg by mouth 2 (two) times daily.   . busPIRone (BUSPAR) 15 MG tablet Take 1 tablet (15 mg total) by mouth 2 (two) times daily.  . calcium carbonate (TUMS - DOSED IN MG ELEMENTAL CALCIUM) 500 MG chewable tablet Chew 1 tablet by mouth as needed for indigestion or heartburn.  . Calcium Carbonate-Vitamin D (CALCIUM-VITAMIN D) 600-200 MG-UNIT CAPS Take 1 capsule by mouth daily.    . Cyanocobalamin (VITAMIN B 12 PO) Take 1 tablet by mouth 2 (two) times daily.   Marland Kitchen dicyclomine (BENTYL) 10 MG capsule Take 1 capsule (10 mg total) by mouth 4 (four) times daily.  . ferrous sulfate 325 (65 FE) MG tablet Take 325 mg by mouth daily with breakfast.  . gabapentin (NEURONTIN) 300 MG capsule Take 2 capsules (600 mg total) by mouth 2 (two) times daily.  . hydrocortisone (ANUSOL-HC) 25 MG suppository Place 1 suppository (25 mg total) rectally  every 12 (twelve) hours.  Marland Kitchen levothyroxine (SYNTHROID, LEVOTHROID) 175 MCG tablet TAKE 1 TABLET BY MOUTH  DAILY BEFORE BREAKFAST  . Lidocaine, Anorectal, 5 % GEL Apply a pea-sized amount to the affected area 3 times daily.  Marland Kitchen loperamide (IMODIUM) 2 MG capsule Take 2 capsules (4 mg total) by mouth every 4 (four) hours as needed for diarrhea or loose stools.  . lovastatin (MEVACOR) 20 MG tablet TAKE 1 TABLET BY MOUTH  DAILY AT 6 PM.  .  Magnesium Chloride 64 MG TBEC Take 64 mg by mouth daily.  . metoCLOPramide (REGLAN) 5 MG tablet Take 5 mg by mouth daily. As needed  . pantoprazole (PROTONIX) 40 MG tablet TAKE 1 TABLET BY MOUTH 30 MINUTES PRIOR TO BREAKFAST AND SUPPER  . pramoxine-hydrocortisone (ANALPRAM HC) cream Apply 1 application topically 2 (two) times daily as needed.  . ranitidine (ZANTAC) 300 MG tablet Take 1 tablet (300 mg total) by mouth at bedtime.  . saccharomyces boulardii (FLORASTOR) 250 MG capsule Take 1 capsule (250 mg total) by mouth 2 (two) times daily.  Marland Kitchen thiamine 100 MG tablet Take 100 mg by mouth daily.    Marland Kitchen zolpidem (AMBIEN) 5 MG tablet TAKE 1 TABLET BY MOUTH AT BEDTIME AS NEEDED FOR SLEEP  . [DISCONTINUED] dicyclomine (BENTYL) 10 MG capsule Take 1 capsule (10 mg total) by mouth 3 (three) times daily before meals.  . [DISCONTINUED] diphenoxylate-atropine (LOMOTIL) 2.5-0.025 MG tablet Take 1 tablet by mouth 4 (four) times daily as needed for diarrhea or loose stools.  . [DISCONTINUED] diphenoxylate-atropine (LOMOTIL) 2.5-0.025 MG tablet Take 1 tablet by mouth 4 (four) times daily as needed for diarrhea or loose stools.  . [DISCONTINUED] pantoprazole (PROTONIX) 40 MG tablet TAKE 1 TABLET BY MOUTH 30 MINUTES PRIOR TO BREAKFAST AND SUPPER  . diphenoxylate-atropine (LOMOTIL) 2.5-0.025 MG tablet Take 1 tablet by mouth every 6 (six) hours as needed for diarrhea or loose stools.  Marland Kitchen HYDROcodone-acetaminophen (NORCO/VICODIN) 5-325 MG tablet Take 1 tablet by mouth every 6 (six) hours as needed for moderate pain.  . [DISCONTINUED] diphenoxylate-atropine (LOMOTIL) 2.5-0.025 MG tablet Take 1 tablet by mouth every 6 (six) hours as needed for diarrhea or loose stools.  . [DISCONTINUED] HYDROcodone-acetaminophen (NORCO/VICODIN) 5-325 MG tablet Take 1 tablet by mouth every 6 (six) hours as needed for moderate pain.  . [DISCONTINUED] valACYclovir (VALTREX) 1000 MG tablet Take 1 tablet (1,000 mg total) by mouth 3 (three) times  daily. (Patient not taking: Reported on 12/31/2016)   Facility-Administered Encounter Medications as of 12/31/2016  Medication  . acetaminophen (TYLENOL) tablet 1,000 mg   Allergies  Allergen Reactions  . Aspirin Other (See Comments)    REACTION: Gi Intolerance/ Burning in stomach  . Trazodone And Nefazodone     "sick"  . Flagyl [Metronidazole] Rash  . Iodine Itching    Allergic to IVP dye  . Morphine And Related Itching   Patient Active Problem List   Diagnosis Date Noted  . 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 UTI (urinary tract infection) 11/27/2014  . UTI (lower urinary tract infection) 11/27/2014  . Acute cystitis with hematuria 11/13/2014  . Hemorrhoids, external 11/04/2014  . Wellness examination 10/03/2014  . Pain in joint, shoulder region 09/24/2014  . Cramp in limb 06/26/2014  . Rectal bleeding 05/02/2014  . External hemorrhoids 05/02/2014  . Hemorrhoids without complication 19/14/7829  . Pain in joint, lower leg 02/01/2014  . Vitamin D deficiency 01/16/2014  . Gastroparesis 09/14/2013  . Other dysphagia 09/04/2013  . Nausea  alone 09/04/2013  . Iron deficiency anemia 08/28/2013  . Hypomagnesemia 05/11/2013  . Hypokalemia 05/11/2013  . Generalized weakness 05/11/2013  . Dehydration 11/25/2011  . Fatigue 11/16/2011  . Breast cancer ([place]) 09/29/2011  . Family history of breast cancer 08/19/2011  . Primary cancer of lower-inner quadrant of left female breast ([place]) 08/17/2011  . Neck pain 07/29/2011  . Routine general medical examination at a health care facility 06/28/2011  . Edema 11/26/2010  . CLOSTRIDIUM DIFFICILE COLITIS 02/07/2010  . Blind loop syndrome 10/01/2009  . Abdominal pain 10/01/2009  . Vitamin B 12 deficiency 08/30/2009  . 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 Rash and nonspecific skin eruption 08/13/2009  . GOITER, MULTINODULAR 04/02/2009  . CONTACT DERMATITIS&OTHER ECZEMA DUE UNSPEC CAUSE 02/20/2009  .  URINARY CALCULUS 10/11/2008  . Asymptomatic menopausal state 10/11/2008  . BACK PAIN, CHRONIC 07/03/2008  . Esophageal reflux 06/12/2008  . Diarrhea 06/12/2008  . ABDOMINAL PAIN-RUQ 05/17/2008  . Diabetes ([place]) 12/10/2006  . Dyslipidemia 12/10/2006  . Gout 12/10/2006  . HYPERTENSION 12/10/2006  . DIVERTICULOSIS, COLON 12/10/2006  . OSTEOARTHRITIS 12/10/2006  . ESOPHAGEAL STRICTURE 08/23/2002  . HIATAL HERNIA 08/23/2002  . INTERNAL HEMORRHOIDS 02/02/2001   Social History   Social History  . Marital status: Married    Spouse name: N/A  . Number of children: 2  . Years of education: N/A   Occupational History  . RETIRED    Social History Main Topics  . Smoking status: Former Smoker    Packs/day: 1.00    Years: 10.00    Types: Cigarettes    Quit date: 09/22/1985  . Smokeless tobacco: Never Used  . Alcohol use No     Comment: 09/29/11 "used to drink socially years ago"  . Drug use: No  . Sexual activity: No     Comment: HRT x many yrs   Other Topics Concern  . Not on file   Social History Narrative   Pt gets regular exercise    Ms. Leitz's family history includes Breast cancer in her cousin, maternal aunt, and paternal aunt; Colon cancer in her paternal uncle; Diabetes in her father and mother; Esophageal cancer in her son; Heart disease in her mother; Hypertension in her father; Kidney disease in her brother and sister.      Objective:    Vitals:   12/31/16 1330  BP: 128/84  Pulse: 76    Physical Exam  well-developed older African-American female in no acute distress, accompanied by her sister. Blood pressure 128/84, pulse 76, BMI 35.2. HEENT; nontraumatic normocephalic EOMI PERRLA sclerae anicteric, Cardiovascular; regular rate and rhythm with S1-S2 no murmur rub or gallop, Pulmonary; clear bilaterally, Abdomen ;soft, bowel sounds are present she has generalized tenderness more notable in the left mid and left lower quadrant and suprapubic area no guarding or  rebound, no palpable mass or hepatosplenomegaly, multiple incisional scars Rectal ;exam not done, 70s to 1+ edema bilateral ankles Neuropsych; mood and affect appropriate       Assessment & Plan:   #34 71 year old African-American female with history of intermittent chronic diarrhea, multifactorial and suspected short bowel data's post multiple prior abdominal surgeries and bowel resections. There may also be a component of bacterial overgrowth and bile salt enteropathy. Patient presents now with acute diarrheal illness present over the past 4-6 weeks. Much worse than her usual intermittent chronic symptoms and more concerning for infectious etiology. Patient has had prior C. 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 #2 chronic GERD #  3 gastroparesis-stable use metoclopramide as needed #4 diverticulosis status post prior bowel resection for diverticulitis   #5 status post hysterectomy, BSO, cholecystectomy, and multiple exploratory laps for lysis of adhesions secondary to obstruction. #6 history of breast cancer #7 status post hemorrhoidectomy June 2018 #8 hypertension  Plan; Check CBC with differential, be met, stool for C. difficile by PCR, GI pathogen panel Refill Protonix 40 mg 1 by mouth twice a day Refill dicyclomine 10 mg and have asked her to use on a regular basis until acute diarrheal episode resolves 1 by mouth every 6 hours. 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  Melvinia Ashby S Jermal Dismuke PA-C 12/31/2016   Cc: Renato Shin, MD

## 2017-01-01 ENCOUNTER — Other Ambulatory Visit: Payer: Medicare Other

## 2017-01-01 DIAGNOSIS — R630 Anorexia: Secondary | ICD-10-CM

## 2017-01-01 DIAGNOSIS — R197 Diarrhea, unspecified: Secondary | ICD-10-CM

## 2017-01-02 LAB — CLOSTRIDIUM DIFFICILE BY PCR: Toxigenic C. Difficile by PCR: NOT DETECTED

## 2017-01-04 LAB — GASTROINTESTINAL PATHOGEN PANEL PCR
C. DIFFICILE TOX A/B, PCR: NOT DETECTED
Campylobacter, PCR: NOT DETECTED
Cryptosporidium, PCR: NOT DETECTED
E COLI (ETEC) LT/ST, PCR: NOT DETECTED
E COLI (STEC) STX1/STX2, PCR: NOT DETECTED
E COLI 0157, PCR: NOT DETECTED
Giardia lamblia, PCR: NOT DETECTED
Norovirus, PCR: NOT DETECTED
Rotavirus A, PCR: NOT DETECTED
SALMONELLA, PCR: NOT DETECTED
Shigella, PCR: NOT DETECTED

## 2017-01-05 ENCOUNTER — Other Ambulatory Visit: Payer: Self-pay

## 2017-01-05 MED ORDER — METOCLOPRAMIDE HCL 5 MG PO TABS: 5.0000 mg | ORAL_TABLET | Freq: Three times a day (TID) | ORAL | 1 refills | Status: DC

## 2017-01-06 ENCOUNTER — Other Ambulatory Visit: Payer: Self-pay

## 2017-01-06 ENCOUNTER — Telehealth: Payer: Self-pay | Admitting: Physician Assistant

## 2017-01-06 MED ORDER — RIFAXIMIN 550 MG PO TABS: 550.0000 mg | ORAL_TABLET | Freq: Three times a day (TID) | ORAL | 0 refills | Status: DC

## 2017-01-06 NOTE — Telephone Encounter (Signed)
Xifaxan prescription re-routed to [facility]. The patient is aware. Spoke with Marisue Humble CMA . She is aware of this and will assist as able.

## 2017-01-06 NOTE — Progress Notes (Signed)
xifaxan 

## 2017-01-07 NOTE — Telephone Encounter (Signed)
Gabby from [facility] pharmacy is needing chart notes regarding xifixan. Best # [phone] F: [phone]

## 2017-01-07 NOTE — Telephone Encounter (Signed)
Records faxed per request.

## 2017-01-13 ENCOUNTER — Telehealth: Payer: Self-pay | Admitting: Physician Assistant

## 2017-01-13 NOTE — Telephone Encounter (Signed)
The patient is waiting for approval on the Xifaxan prescription. [facility] is working on this. There are no samples to offer her. She reports her symptoms are unchanged. She is out of pain medication. She continues to have off and on diarrhea. She has stomach cramping and pain.

## 2017-01-14 ENCOUNTER — Telehealth: Payer: Self-pay | Admitting: Physician Assistant

## 2017-01-14 NOTE — Telephone Encounter (Signed)
Ok .. Have some samples and asked leslie to try to get 7 more days worth - hopefully will have by Monday

## 2017-01-14 NOTE — Telephone Encounter (Signed)
[name] just got letter that it has been approved- she will check cost - I have a few days worth of samples and can get more samples if cost too  high - I do not want to give her more narcotics

## 2017-01-14 NOTE — Telephone Encounter (Signed)
Duplicated encounter. See other ongoing phone notes.

## 2017-01-14 NOTE — Telephone Encounter (Signed)
Patient says the co-pay is $500.00

## 2017-01-17 ENCOUNTER — Other Ambulatory Visit: Payer: Self-pay | Admitting: Endocrinology

## 2017-01-18 ENCOUNTER — Other Ambulatory Visit: Payer: Self-pay

## 2017-01-18 ENCOUNTER — Telehealth: Payer: Self-pay | Admitting: Physician Assistant

## 2017-01-18 MED ORDER — RIFAXIMIN 550 MG PO TABS: 550.0000 mg | ORAL_TABLET | Freq: Three times a day (TID) | ORAL | 0 refills | Status: DC

## 2017-01-18 NOTE — Telephone Encounter (Signed)
The patient has been told by the [facility] the prescription for Xifaxan can be sent to them and she will not have a co-pay.  Rx transmitted to the [facility].

## 2017-01-18 NOTE — Progress Notes (Signed)
Rx to [facility] for mail order

## 2017-01-18 NOTE — Telephone Encounter (Signed)
Patient will use her VA benefits. Rx sent to [facility] through the [place]

## 2017-01-21 ENCOUNTER — Other Ambulatory Visit: Payer: Self-pay

## 2017-01-21 ENCOUNTER — Telehealth: Payer: Self-pay | Admitting: Physician Assistant

## 2017-01-21 MED ORDER — RIFAXIMIN 550 MG PO TABS: 550.0000 mg | ORAL_TABLET | Freq: Three times a day (TID) | ORAL | 0 refills | Status: AC

## 2017-01-21 NOTE — Telephone Encounter (Signed)
Patient calling back for [name] best call back # is [phone].

## 2017-01-21 NOTE — Telephone Encounter (Signed)
Patient called the [place]. She is calling me today because the [place] cannot verify the prescription was received. She has a fax number for me. Confirmed with the patient that it is the correct pharamcy the Rx was sent to. I told the patient it was sent electronically originally, but I will print it and fax it. She will stay in touch with me regarding this.

## 2017-01-26 ENCOUNTER — Telehealth: Payer: Self-pay | Admitting: Physician Assistant

## 2017-01-26 ENCOUNTER — Other Ambulatory Visit: Payer: Self-pay

## 2017-01-26 NOTE — Telephone Encounter (Signed)
Asked for the PA appeal to be deleted

## 2017-01-26 NOTE — Telephone Encounter (Signed)
Left a message to call back.

## 2017-01-26 NOTE — Telephone Encounter (Signed)
I called the [facility]. The tech is unable to see the patient's account because there is a "block" on it. The patient will have to call the home office in [place] at [phone] to have this corrected. Able to obtain samples of Xifaxan 550 mg to take TID for 14 days. I left this information on her voicemail. The medication is at the front desk for patient to pick up

## 2017-02-02 ENCOUNTER — Telehealth: Payer: Self-pay | Admitting: Physician Assistant

## 2017-02-02 NOTE — Telephone Encounter (Signed)
Pharmacy called to state they need patient allergy list to fill medication request. Pharmacy states a call back to discuss would be fastest way of communication.

## 2017-02-02 NOTE — Telephone Encounter (Signed)
Called and spoke to representative from [place], [phone]. They were trying to process the prescription for Xifaxan 550 mg.  I gave them the medications the patient is allergic to and what reactions she has to these medications.

## 2017-02-08 ENCOUNTER — Telehealth: Payer: Self-pay | Admitting: Endocrinology

## 2017-02-08 NOTE — Telephone Encounter (Signed)
Patient called in reference to needing all prescriptions sent to  [facility] [place], [place] - [address] [phone] (Phone) [phone] (Fax)   Please call patient and advise with any questions.

## 2017-02-09 ENCOUNTER — Other Ambulatory Visit: Payer: Self-pay

## 2017-02-09 NOTE — Telephone Encounter (Signed)
Called patient and left message to call back to clarify since prescriptions being transferred are usually done with the pharmacy.

## 2017-02-10 NOTE — Telephone Encounter (Signed)
Patient called in reference to note below and stated that pharmacy stated the doctor has to send in the Rx. Patient stated she is running out of medication. Please call patient and advise.

## 2017-02-16 ENCOUNTER — Other Ambulatory Visit: Payer: Self-pay

## 2017-02-16 ENCOUNTER — Telehealth: Payer: Self-pay

## 2017-02-16 DIAGNOSIS — E119 Type 2 diabetes mellitus without complications: Secondary | ICD-10-CM | POA: Diagnosis not present

## 2017-02-16 DIAGNOSIS — E785 Hyperlipidemia, unspecified: Secondary | ICD-10-CM

## 2017-02-16 DIAGNOSIS — E559 Vitamin D deficiency, unspecified: Secondary | ICD-10-CM

## 2017-02-16 DIAGNOSIS — M109 Gout, unspecified: Secondary | ICD-10-CM

## 2017-02-16 LAB — HM DIABETES EYE EXAM

## 2017-02-16 MED ORDER — LOVASTATIN 20 MG PO TABS: ORAL_TABLET | ORAL | 2 refills | Status: DC

## 2017-02-16 MED ORDER — DIPHENOXYLATE-ATROPINE 2.5-0.025 MG PO TABS: 1.0000 | ORAL_TABLET | Freq: Four times a day (QID) | ORAL | 4 refills | Status: DC | PRN

## 2017-02-16 MED ORDER — GABAPENTIN 300 MG PO CAPS: 600.0000 mg | ORAL_CAPSULE | Freq: Two times a day (BID) | ORAL | 11 refills | Status: DC

## 2017-02-16 MED ORDER — ALLOPURINOL 100 MG PO TABS: 100.0000 mg | ORAL_TABLET | Freq: Every day | ORAL | 3 refills | Status: DC

## 2017-02-16 MED ORDER — PANTOPRAZOLE SODIUM 40 MG PO TBEC: DELAYED_RELEASE_TABLET | ORAL | 11 refills | Status: DC

## 2017-02-16 MED ORDER — RANITIDINE HCL 300 MG PO TABS: 300.0000 mg | ORAL_TABLET | Freq: Every day | ORAL | 2 refills | Status: DC

## 2017-02-16 MED ORDER — AMILORIDE HCL 5 MG PO TABS: 5.0000 mg | ORAL_TABLET | Freq: Two times a day (BID) | ORAL | 6 refills | Status: DC

## 2017-02-16 MED ORDER — LEVOTHYROXINE SODIUM 175 MCG PO TABS: ORAL_TABLET | ORAL | 3 refills | Status: DC

## 2017-02-16 NOTE — Telephone Encounter (Signed)
Error

## 2017-03-01 ENCOUNTER — Other Ambulatory Visit: Payer: Self-pay | Admitting: Endocrinology

## 2017-03-09 ENCOUNTER — Ambulatory Visit: Payer: Medicare Other

## 2017-03-09 ENCOUNTER — Ambulatory Visit: Payer: Medicare Other | Admitting: Endocrinology

## 2017-03-09 DIAGNOSIS — I10 Essential (primary) hypertension: Secondary | ICD-10-CM | POA: Diagnosis not present

## 2017-03-09 DIAGNOSIS — N182 Chronic kidney disease, stage 2 (mild): Secondary | ICD-10-CM | POA: Diagnosis not present

## 2017-03-09 DIAGNOSIS — Z23 Encounter for immunization: Secondary | ICD-10-CM | POA: Diagnosis not present

## 2017-03-16 ENCOUNTER — Other Ambulatory Visit: Payer: Self-pay

## 2017-03-16 NOTE — Patient Outreach (Signed)
[facility]) Care Management  03/16/2017  KWYNN SCHLOTTER 02/28/1946 [id number]    Medication Adherence call to Mrs. Ewell Poe the reason for this call is because Mrs. Forgey is showing past due under [place] [employer] care Ins. on her Lovastatin 20 mg spoke to patient she said she is now getting her medication through  the [place] and does not have [place] any longer.  [place] Management Direct Dial [phone]  Fax [phone] [email or web address]

## 2017-03-28 ENCOUNTER — Other Ambulatory Visit: Payer: Self-pay | Admitting: Gastroenterology

## 2017-03-30 ENCOUNTER — Telehealth: Payer: Self-pay | Admitting: Endocrinology

## 2017-03-30 ENCOUNTER — Other Ambulatory Visit: Payer: Self-pay

## 2017-03-30 NOTE — Telephone Encounter (Signed)
Caller Name: Adanna Zuckerman    Relationship to Patient: self     Best Number: [phone]    Pharmacy: [facility] [address]  Reason for call: She can no longer find the 1500 mg of vitamin B-12  tablets that she previously was taking.  She also checked with [facility].  Please advise

## 2017-03-30 NOTE — Telephone Encounter (Signed)
I have tried to call patient on both numbers given but one line was busy & the other VM not set up.

## 2017-04-15 ENCOUNTER — Other Ambulatory Visit: Payer: Self-pay | Admitting: Endocrinology

## 2017-06-02 ENCOUNTER — Other Ambulatory Visit: Payer: Self-pay | Admitting: Endocrinology

## 2017-06-28 ENCOUNTER — Encounter: Payer: Self-pay | Admitting: Endocrinology

## 2017-06-28 ENCOUNTER — Ambulatory Visit (INDEPENDENT_AMBULATORY_CARE_PROVIDER_SITE_OTHER): Payer: Medicare Other | Admitting: Endocrinology

## 2017-06-28 VITALS — BP 112/72 | HR 75 | Wt 200.8 lb

## 2017-06-28 DIAGNOSIS — E119 Type 2 diabetes mellitus without complications: Secondary | ICD-10-CM | POA: Diagnosis not present

## 2017-06-28 DIAGNOSIS — M1A9XX0 Chronic gout, unspecified, without tophus (tophi): Secondary | ICD-10-CM | POA: Diagnosis not present

## 2017-06-28 DIAGNOSIS — Z23 Encounter for immunization: Secondary | ICD-10-CM

## 2017-06-28 DIAGNOSIS — E538 Deficiency of other specified B group vitamins: Secondary | ICD-10-CM | POA: Diagnosis not present

## 2017-06-28 DIAGNOSIS — E559 Vitamin D deficiency, unspecified: Secondary | ICD-10-CM | POA: Diagnosis not present

## 2017-06-28 LAB — BASIC METABOLIC PANEL
BUN: 25 mg/dL — ABNORMAL HIGH (ref 6–23)
CHLORIDE: 104 meq/L (ref 96–112)
CO2: 28 meq/L (ref 19–32)
CREATININE: 1.06 mg/dL (ref 0.40–1.20)
Calcium: 9.8 mg/dL (ref 8.4–10.5)
GFR: 65.31 mL/min (ref >60.00)
GLUCOSE: 97 mg/dL (ref 70–99)
Potassium: 4.7 mEq/L (ref 3.5–5.1)
Sodium: 139 mEq/L (ref 135–145)

## 2017-06-28 LAB — VITAMIN D 25 HYDROXY (VIT D DEFICIENCY, FRACTURES): VITD: 28.46 ng/mL — ABNORMAL LOW (ref 30.00–100.00)

## 2017-06-28 LAB — MAGNESIUM: Magnesium: 1.5 mg/dL (ref 1.5–2.5)

## 2017-06-28 LAB — URIC ACID: Uric Acid, Serum: 4.4 mg/dL (ref 2.4–7.0)

## 2017-06-28 LAB — MICROALBUMIN / CREATININE URINE RATIO
CREATININE, U: 153.7 mg/dL
MICROALB UR: 3.7 mg/dL — AB (ref 0.0–1.9)
MICROALB/CREAT RATIO: 2.4 mg/g (ref 0.0–30.0)

## 2017-06-28 LAB — HEMOGLOBIN A1C: HEMOGLOBIN A1C: 5.7 % (ref 4.6–6.5)

## 2017-06-28 LAB — TSH: TSH: 2.37 u[IU]/mL (ref 0.35–4.50)

## 2017-06-28 LAB — VITAMIN B12: Vitamin B-12: 404 pg/mL (ref 211–911)

## 2017-06-28 MED ORDER — METHOCARBAMOL 500 MG PO TABS: 500.0000 mg | ORAL_TABLET | Freq: Two times a day (BID) | ORAL | 11 refills | Status: DC | PRN

## 2017-06-28 NOTE — Progress Notes (Signed)
Subjective:    Patient ID: [name], female    DOB: 09/21/1945, 72 y.o.   MRN: [id number]  HPI Pt states 1 week of moderate cramps in the legs, and assoc generalized body pain.   She takes PO B-12, 1 mg qd Past Medical History:  Diagnosis Date  . ANEMIA, IRON DEFICIENCY 05/08/2009  . Angina   . ASYMPTOMATIC POSTMENOPAUSAL STATUS 10/11/2008  . Blood transfusion   . Blood transfusion without reported diagnosis   . Breast cancer ([place]) 09/29/11   invasive grade III ductal ca,assoc high grade dcis,ER/PR=neg  . C. difficile colitis   . Diverticulosis of colon (without mention of hemorrhage)   . Esophageal reflux 06/12/2008  . Gastroparesis   . GOITER, MULTINODULAR 04/02/2009  . Gout, unspecified   . H/O hiatal hernia   . History of lower GI bleeding   . History of radiation therapy 02/08/12-03/25/12   left breast,total 61gy  . Hypokalemia 05/11/2013  . Hypomagnesemia   . HYPOTHYROIDISM, POST-RADIATION 08/13/2009  . Internal hemorrhoids without mention of complication   . Kidney stones    "several"  . Leukopenia   . Migraines   . Obesity   . Osteoarthrosis, unspecified whether generalized or localized, unspecified site   . Other and unspecified hyperlipidemia   . Personal history of chemotherapy 2013  . Personal history of radiation therapy 2013   left  . PONV (postoperative nausea and vomiting)   . PUD (peptic ulcer disease)   . Short bowel syndrome   . Shortness of breath on exertion    "sometimes"  . Stricture and stenosis of esophagus   . Thyrotoxicosis without mention of goiter or other cause, without mention of thyrotoxic crisis or storm   . Type II or unspecified type diabetes mellitus without mention of complication, not stated as uncontrolled    no med in years diet controled  . Unspecified essential hypertension   . UTI (urinary tract infection)   . Varicose veins   . VITAMIN B12 DEFICIENCY 08/30/2009    Past Surgical History:  Procedure Laterality Date    . ABDOMINAL ADHESION SURGERY  1980's thru 1990's   "several"  . ABDOMINAL HYSTERECTOMY  1970's   with BSO  . BREAST BIOPSY Left 08/13/11   left breast lower inner quadrant  . BREAST BIOPSY Right 1985   Rt exc bx, benign  . BREAST LUMPECTOMY W/ NEEDLE LOCALIZATION  09/29/11   left  breast=lymph node,excision benign/ ER/PR=neg, her 2 Positive  . BUNIONECTOMY  1970's   bilateral  . CHOLECYSTECTOMY  1990's  . COLON SURGERY     "several surgeries for short bowel syndrome"  . COLONOSCOPY  2012   multiple   . DILATION AND CURETTAGE OF UTERUS    . ESOPHAGOGASTRODUODENOSCOPY  2011   multiple   . EXCISIONAL HEMORRHOIDECTOMY  11/10/2016  . EYE SURGERY     "long time ago"  . FLEXIBLE SIGMOIDOSCOPY  2011   multiple   . KIDNEY STONE SURGERY  1990's   "tried to go up & get it but pushed it further up"  . LITHOTRIPSY     "4 or 5 times"  . MASTECTOMY W/ NODES PARTIAL  09/29/11   left  . PORT-A-CATH REMOVAL Right 12/19/2013   Procedure: MINOR REMOVAL PORT-A-CATH;  Surgeon: Adin Hector, MD;  Location: [place];  Service: General;  Laterality: Right;  . PORTACATH PLACEMENT  09/29/2011   Procedure: INSERTION PORT-A-CATH;  Surgeon: Adin Hector, MD;  Location: [place];  Service: General;  Laterality: N/A;  . Thyroid Ultrasound  12/1994 and 12/1995  . TOTAL KNEE ARTHROPLASTY Right 06/05/2015   Procedure: RIGHT TOTAL KNEE ARTHROPLASTY;  Surgeon: Ninetta Lights, MD;  Location: [place];  Service: Orthopedics;  Laterality: Right;  . VEIN LIGATION AND STRIPPING  1980's   Right leg    Social History   Socioeconomic History  . Marital status: Married    Spouse name: Not on file  . Number of children: 2  . Years of education: Not on file  . Highest education level: Not on file  Social Needs  . Financial resource strain: Not on file  . Food insecurity - worry: Not on file  . Food insecurity - inability: Not on file  . Transportation needs - medical: Not on file  .  Transportation needs - non-medical: Not on file  Occupational History  . Occupation: RETIRED  Tobacco Use  . Smoking status: Former Smoker    Packs/day: 1.00    Years: 10.00    Pack years: 10.00    Types: Cigarettes    Last attempt to quit: 09/22/1985    Years since quitting: 31.7  . Smokeless tobacco: Never Used  Substance and Sexual Activity  . Alcohol use: No    Comment: 09/29/11 "used to drink socially years ago"  . Drug use: No  . Sexual activity: No    Comment: HRT x many yrs  Other Topics Concern  . Not on file  Social History Narrative   Pt gets regular exercise    Current Outpatient Medications on File Prior to Visit  Medication Sig Dispense Refill  . allopurinol (ZYLOPRIM) 100 MG tablet Take 1 tablet (100 mg total) by mouth daily. 90 tablet 3  . aMILoride (MIDAMOR) 5 MG tablet Take 1 tablet (5 mg total) by mouth 2 (two) times daily. 90 tablet 6  . busPIRone (BUSPAR) 15 MG tablet Take 1 tablet (15 mg total) by mouth 2 (two) times daily. 30 tablet 11  . calcium carbonate (TUMS - DOSED IN MG ELEMENTAL CALCIUM) 500 MG chewable tablet Chew 1 tablet by mouth as needed for indigestion or heartburn.    . Calcium Carbonate-Vitamin D (CALCIUM-VITAMIN D) 600-200 MG-UNIT CAPS Take 1 capsule by mouth daily.      . Cyanocobalamin (VITAMIN B 12 PO) Take 1 tablet by mouth 2 (two) times daily.     Marland Kitchen dicyclomine (BENTYL) 10 MG capsule Take 1 capsule (10 mg total) by mouth 4 (four) times daily. 120 capsule 6  . diphenoxylate-atropine (LOMOTIL) 2.5-0.025 MG tablet Take 1 tablet by mouth every 6 (six) hours as needed for diarrhea or loose stools. 50 tablet 4  . ferrous sulfate 325 (65 FE) MG tablet Take 325 mg by mouth daily with breakfast.    . gabapentin (NEURONTIN) 300 MG capsule Take 2 capsules (600 mg total) by mouth 2 (two) times daily. 120 capsule 11  . HYDROcodone-acetaminophen (NORCO/VICODIN) 5-325 MG tablet Take 1 tablet by mouth every 6 (six) hours as needed for moderate pain. 30  tablet 0  . hydrocortisone (ANUSOL-HC) 25 MG suppository Place 1 suppository (25 mg total) rectally every 12 (twelve) hours. 12 suppository 1  . levothyroxine (SYNTHROID, LEVOTHROID) 175 MCG tablet TAKE 1 TABLET BY MOUTH  DAILY BEFORE BREAKFAST 90 tablet 3  . Lidocaine, Anorectal, 5 % GEL Apply a pea-sized amount to the affected area 3 times daily. 30 g 0  . loperamide (IMODIUM) 2 MG capsule Take 2 capsules (4 mg total) by mouth  every 4 (four) hours as needed for diarrhea or loose stools. 30 capsule 0  . lovastatin (MEVACOR) 20 MG tablet TAKE 1 TABLET BY MOUTH  DAILY AT 6 PM. 90 tablet 2  . Magnesium Chloride 64 MG TBEC Take 64 mg by mouth daily.    . metoCLOPramide (REGLAN) 5 MG tablet Take 1 tablet (5 mg total) by mouth 4 (four) times daily -  before meals and at bedtime. As needed 360 tablet 1  . ondansetron (ZOFRAN-ODT) 4 MG disintegrating tablet TAKE 1 TABLET BY MOUTH EVERY 8 HOURS AS NEEDED FOR NAUSEA 30 tablet 2  . pantoprazole (PROTONIX) 40 MG tablet TAKE 1 TABLET BY MOUTH 30 MINUTES PRIOR TO BREAKFAST AND SUPPER 60 tablet 11  . pramoxine-hydrocortisone (ANALPRAM HC) cream Apply 1 application topically 2 (two) times daily as needed. 90 g 2  . ranitidine (ZANTAC) 300 MG tablet Take 1 tablet (300 mg total) by mouth at bedtime. 90 tablet 2  . saccharomyces boulardii (FLORASTOR) 250 MG capsule Take 1 capsule (250 mg total) by mouth 2 (two) times daily. 60 capsule 1  . thiamine 100 MG tablet Take 100 mg by mouth daily.      Marland Kitchen zolpidem (AMBIEN) 5 MG tablet TAKE 1 TABLET AT BEDTIME AS NEEDED FOR SLEEP 30 tablet 0   Current Facility-Administered Medications on File Prior to Visit  Medication Dose Route Frequency Provider Last Rate Last Dose  . acetaminophen (TYLENOL) tablet 1,000 mg  1,000 mg Oral Once Amada Kingfisher, MD        Allergies  Allergen Reactions  . Aspirin Other (See Comments)    REACTION: Gi Intolerance/ Burning in stomach  . Trazodone And Nefazodone     "sick"  . Flagyl  [Metronidazole] Rash  . Iodine Itching    Allergic to IVP dye  . Morphine And Related Itching    Family History  Problem Relation Age of Onset  . Esophageal cancer Son        deceased  . Diabetes Mother   . Heart disease Mother   . Kidney disease Sister   . Diabetes Father   . Hypertension Father   . Kidney disease Brother        x 3  . Colon cancer Paternal Uncle   . Breast cancer Maternal Aunt   . Breast cancer Paternal Aunt   . Breast cancer Cousin        Pt states she has 15+ cousins w/ Breast CA    BP 112/72 (BP Location: Right Arm, Patient Position: Sitting, Cuff Size: Normal)   Pulse 75   Wt 200 lb 12.8 oz (91.1 kg)   SpO2 97%   BMI 33.93 kg/m   Review of Systems Denies fever and weight loss.      Objective:   Physical Exam VITAL SIGNS:  See vs page GENERAL: no distress Legs: no edema.  No tenderness.   Gait: steady, with a cane.       Assessment & Plan:  Leg cramps, new, uncertain etiology. B-12 def, now on oral rx. Gout: due for recheck.   Patient Instructions  blood tests are requested for you today.  We'll let you know about the results. I have sent a prescription to your pharmacy, for the cramps.

## 2017-06-28 NOTE — Patient Instructions (Addendum)
blood tests are requested for you today.  We'll let you know about the results. I have sent a prescription to your pharmacy, for the cramps.

## 2017-06-29 LAB — PTH, INTACT AND CALCIUM
CALCIUM: 9.6 mg/dL (ref 8.6–10.4)
PTH: 62 pg/mL (ref 14–64)

## 2017-07-02 DIAGNOSIS — M1712 Unilateral primary osteoarthritis, left knee: Secondary | ICD-10-CM | POA: Diagnosis not present

## 2017-07-13 ENCOUNTER — Other Ambulatory Visit: Payer: Self-pay | Admitting: Endocrinology

## 2017-07-30 ENCOUNTER — Inpatient Hospital Stay: Payer: Medicare Other | Attending: Adult Health | Admitting: Adult Health

## 2017-07-30 ENCOUNTER — Ambulatory Visit: Payer: Medicare Other

## 2017-07-30 ENCOUNTER — Ambulatory Visit
Admission: RE | Admit: 2017-07-30 | Discharge: 2017-07-30 | Disposition: A | Discharge status: Home / Self Care | Payer: Medicare Other | Source: Ambulatory Visit | Attending: Adult Health | Admitting: Adult Health

## 2017-07-30 ENCOUNTER — Encounter: Payer: Self-pay | Admitting: Adult Health

## 2017-07-30 ENCOUNTER — Ambulatory Visit (HOSPITAL_COMMUNITY)
Admission: RE | Admit: 2017-07-30 | Discharge: 2017-07-30 | Disposition: A | Discharge status: Home / Self Care | Payer: Medicare Other | Source: Ambulatory Visit | Attending: Adult Health | Admitting: Adult Health

## 2017-07-30 ENCOUNTER — Telehealth: Payer: Self-pay

## 2017-07-30 VITALS — BP 128/82 | HR 76 | Temp 97.5°F | Resp 18 | Ht 64.5 in | Wt 201.6 lb

## 2017-07-30 DIAGNOSIS — R928 Other abnormal and inconclusive findings on diagnostic imaging of breast: Secondary | ICD-10-CM | POA: Diagnosis not present

## 2017-07-30 DIAGNOSIS — R0789 Other chest pain: Secondary | ICD-10-CM | POA: Diagnosis not present

## 2017-07-30 DIAGNOSIS — Z853 Personal history of malignant neoplasm of breast: Secondary | ICD-10-CM | POA: Diagnosis not present

## 2017-07-30 DIAGNOSIS — Z9012 Acquired absence of left breast and nipple: Secondary | ICD-10-CM | POA: Diagnosis not present

## 2017-07-30 DIAGNOSIS — M25512 Pain in left shoulder: Secondary | ICD-10-CM | POA: Insufficient documentation

## 2017-07-30 DIAGNOSIS — C50312 Malignant neoplasm of lower-inner quadrant of left female breast: Secondary | ICD-10-CM

## 2017-07-30 DIAGNOSIS — N644 Mastodynia: Secondary | ICD-10-CM | POA: Diagnosis not present

## 2017-07-30 DIAGNOSIS — Z86000 Personal history of in-situ neoplasm of breast: Secondary | ICD-10-CM | POA: Insufficient documentation

## 2017-07-30 DIAGNOSIS — R079 Chest pain, unspecified: Secondary | ICD-10-CM | POA: Diagnosis not present

## 2017-07-30 MED ORDER — TRAMADOL HCL 50 MG PO TABS: 25.0000 mg | ORAL_TABLET | Freq: Four times a day (QID) | ORAL | 0 refills | Status: DC | PRN

## 2017-07-30 NOTE — Assessment & Plan Note (Addendum)
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 Currently on observation  Breast cancer surveillance: 1. Breast and chest wall exam 08/18/2016 is normal 2. Mammogram 10/08/2015 is normal 3. Bone density test September 2015 showed a T score of -0.7 normal range  For her pain I ordered a diagnostic mammogram and ultrasound today.  I also ordered a chest xray to be done.  We will discuss these results and then decide if any other imaging is needed.  Due to her pain, and Tylenol/Advil not alleviating it, I prescribed a short course of Tramadol, Q12H PRN #20--side effects reviewed with her in detail.  [name] verbalizes understanding as she has taken Tramadol previously.  [place] CSRS reviewed, she has not received another controlled substance recently.  I reviewed the prescription with Dr. Jana Hakim in detail who agrees with its appropriateness.

## 2017-07-30 NOTE — Telephone Encounter (Signed)
Received VM from patient regarding left breast pain and would like to be seen earlier than scheduled appt 3/20 and wonders if needs mammogram.  Attempted to call pt but line busy then scheduler came to my desk saying pt is on their line now.  Per Wilber Bihari NP, pt can come today for 1:30 pm appt today.  Pt agreeable and appt made,  notified Mendel Ryder NP.

## 2017-07-30 NOTE — Progress Notes (Signed)
[facility] Follow up:    Renato Shin, MD [address] [place] [address]   DIAGNOSIS: Cancer Staging Primary cancer of lower-inner quadrant of left female breast ([place]) Staging form: Breast, AJCC 7th Edition - Clinical: Stage IA (T1c, N0, cM0) - Unsigned Staging comments: Staged In Breast Conference  - Pathologic: No stage assigned - Unsigned   SUMMARY OF ONCOLOGIC HISTORY: Per Dr. 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 Currently on observation  CURRENT THERAPY: Observation  INTERVAL HISTORY: [name] 72 y.o. female returns for urgent evaluation of left breast pain.  She is nervous because she has never experienced this type of breast pain previously. The pain is at her previous lumpectomy site and at 5 o'clock in her breast.  She denies erythema, swelling, warmth, fever/chills to the breast/generalized.  She says the pain can be so severe it will radiate posteriorly to her back.  She says it is constant, worse at night.  She is due for her mammogram soon, but feels like she needs one expedited. She is taking Tylenol/advil for the pain without relief.    Patient Active Problem List   Diagnosis Date Noted  . Breast cancer ([facility]) 09/29/2011    Priority: High  . 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 UTI (urinary tract infection) 11/27/2014  . UTI (lower urinary tract infection) 11/27/2014  . Acute cystitis with hematuria 11/13/2014  . Hemorrhoids, external 11/04/2014  . Wellness examination 10/03/2014  . Pain in joint, shoulder region 09/24/2014  . Cramp in limb 06/26/2014  . Rectal  bleeding 05/02/2014  . External hemorrhoids 05/02/2014  . Hemorrhoids without complication 80/16/5537  . Pain in joint, lower leg 02/01/2014  . Vitamin D deficiency 01/16/2014  . Gastroparesis 09/14/2013  . Other dysphagia 09/04/2013  . Nausea alone 09/04/2013  . Iron deficiency anemia 08/28/2013  . Hypomagnesemia 05/11/2013  . Hypokalemia 05/11/2013  . Generalized weakness 05/11/2013  . Dehydration 11/25/2011  . Fatigue 11/16/2011  . Family history of breast cancer 08/19/2011  . Primary cancer of lower-inner quadrant of left female breast ([facility]) 08/17/2011  . Neck pain 07/29/2011  . Routine general medical examination at a health care facility 06/28/2011  . Edema 11/26/2010  . CLOSTRIDIUM DIFFICILE COLITIS 02/07/2010  . Blind loop syndrome 10/01/2009  . Abdominal pain 10/01/2009  . Vitamin B 12 deficiency 08/30/2009  . 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 Rash and nonspecific skin eruption 08/13/2009  . GOITER, MULTINODULAR 04/02/2009  . CONTACT DERMATITIS&OTHER ECZEMA DUE UNSPEC CAUSE 02/20/2009  . URINARY CALCULUS 10/11/2008  . Asymptomatic menopausal state 10/11/2008  . BACK PAIN, CHRONIC 07/03/2008  . Esophageal reflux 06/12/2008  . Diarrhea 06/12/2008  . ABDOMINAL PAIN-RUQ 05/17/2008  . Diabetes ([facility]) 12/10/2006  . Dyslipidemia 12/10/2006  . Gout 12/10/2006  . HYPERTENSION 12/10/2006  . DIVERTICULOSIS, COLON 12/10/2006  . OSTEOARTHRITIS 12/10/2006  . ESOPHAGEAL STRICTURE 08/23/2002  . HIATAL HERNIA 08/23/2002  . INTERNAL HEMORRHOIDS 02/02/2001    is allergic to aspirin; trazodone and nefazodone; flagyl [metronidazole]; iodine; and morphine and related.  MEDICAL HISTORY: Past Medical History:  Diagnosis Date  . ANEMIA, IRON DEFICIENCY 05/08/2009  . Angina   . ASYMPTOMATIC POSTMENOPAUSAL STATUS 10/11/2008  .  Blood transfusion   . Blood transfusion without reported diagnosis   . Breast cancer ([facility]) 09/29/11   invasive grade III ductal  ca,assoc high grade dcis,ER/PR=neg  . C. difficile colitis   . Diverticulosis of colon (without mention of hemorrhage)   . Esophageal reflux 06/12/2008  . Gastroparesis   . GOITER, MULTINODULAR 04/02/2009  . Gout, unspecified   . H/O hiatal hernia   . History of lower GI bleeding   . History of radiation therapy 02/08/12-03/25/12   left breast,total 61gy  . Hypokalemia 05/11/2013  . Hypomagnesemia   . HYPOTHYROIDISM, POST-RADIATION 08/13/2009  . Internal hemorrhoids without mention of complication   . Kidney stones    "several"  . Leukopenia   . Migraines   . Obesity   . Osteoarthrosis, unspecified whether generalized or localized, unspecified site   . Other and unspecified hyperlipidemia   . Personal history of chemotherapy 2013  . Personal history of radiation therapy 2013   left  . PONV (postoperative nausea and vomiting)   . PUD (peptic ulcer disease)   . Short bowel syndrome   . Shortness of breath on exertion    "sometimes"  . Stricture and stenosis of esophagus   . Thyrotoxicosis without mention of goiter or other cause, without mention of thyrotoxic crisis or storm   . Type II or unspecified type diabetes mellitus without mention of complication, not stated as uncontrolled    no med in years diet controled  . Unspecified essential hypertension   . UTI (urinary tract infection)   . Varicose veins   . VITAMIN B12 DEFICIENCY 08/30/2009    SURGICAL HISTORY: Past Surgical History:  Procedure Laterality Date  . ABDOMINAL ADHESION SURGERY  1980's thru 1990's   "several"  . ABDOMINAL HYSTERECTOMY  1970's   with BSO  . BREAST BIOPSY Left 08/13/11   left breast lower inner quadrant  . BREAST BIOPSY Right 1985   Rt exc bx, benign  . BREAST LUMPECTOMY W/ NEEDLE LOCALIZATION  09/29/11   left  breast=lymph node,excision benign/ ER/PR=neg, her 2 Positive  . BUNIONECTOMY  1970's   bilateral  . CHOLECYSTECTOMY  1990's  . COLON SURGERY     "several surgeries for short bowel  syndrome"  . COLONOSCOPY  2012   multiple   . DILATION AND CURETTAGE OF UTERUS    . ESOPHAGOGASTRODUODENOSCOPY  2011   multiple   . EXCISIONAL HEMORRHOIDECTOMY  11/10/2016  . EYE SURGERY     "long time ago"  . FLEXIBLE SIGMOIDOSCOPY  2011   multiple   . KIDNEY STONE SURGERY  1990's   "tried to go up & get it but pushed it further up"  . LITHOTRIPSY     "4 or 5 times"  . MASTECTOMY W/ NODES PARTIAL  09/29/11   left  . PORT-A-CATH REMOVAL Right 12/19/2013   Procedure: MINOR REMOVAL PORT-A-CATH;  Surgeon: Adin Hector, MD;  Location: [facility];  Service: General;  Laterality: Right;  . PORTACATH PLACEMENT  09/29/2011   Procedure: INSERTION PORT-A-CATH;  Surgeon: Adin Hector, MD;  Location: [facility];  Service: General;  Laterality: N/A;  . Thyroid Ultrasound  12/1994 and 12/1995  . TOTAL KNEE ARTHROPLASTY Right 06/05/2015   Procedure: RIGHT TOTAL KNEE ARTHROPLASTY;  Surgeon: Ninetta Lights, MD;  Location: [facility];  Service: Orthopedics;  Laterality: Right;  . VEIN LIGATION AND STRIPPING  1980's   Right leg    SOCIAL HISTORY: Social History   Socioeconomic History  . Marital  status: Married    Spouse name: Not on file  . Number of children: 2  . Years of education: Not on file  . Highest education level: Not on file  Social Needs  . Financial resource strain: Not on file  . Food insecurity - worry: Not on file  . Food insecurity - inability: Not on file  . Transportation needs - medical: Not on file  . Transportation needs - non-medical: Not on file  Occupational History  . Occupation: RETIRED  Tobacco Use  . Smoking status: Former Smoker    Packs/day: 1.00    Years: 10.00    Pack years: 10.00    Types: Cigarettes    Last attempt to quit: 09/22/1985    Years since quitting: 31.8  . Smokeless tobacco: Never Used  Substance and Sexual Activity  . Alcohol use: No    Comment: 09/29/11 "used to drink socially years ago"  . Drug use: No  . Sexual activity:  No    Comment: HRT x many yrs  Other Topics Concern  . Not on file  Social History Narrative   Pt gets regular exercise    FAMILY HISTORY: Family History  Problem Relation Age of Onset  . Esophageal cancer Son        deceased  . Diabetes Mother   . Heart disease Mother   . Kidney disease Sister   . Diabetes Father   . Hypertension Father   . Kidney disease Brother        x 3  . Colon cancer Paternal Uncle   . Breast cancer Maternal Aunt   . Breast cancer Paternal Aunt   . Breast cancer Cousin        Pt states she has 15+ cousins w/ Breast CA    Review of Systems  Constitutional: Negative for appetite change, chills, fatigue, fever and unexpected weight change.  HENT:   Negative for hearing loss and lump/mass.   Eyes: Negative for eye problems and icterus.  Respiratory: Negative for chest tightness, cough and shortness of breath.   Cardiovascular: Negative for chest pain, leg swelling and palpitations.  Gastrointestinal: Negative for abdominal distention, abdominal pain, constipation, diarrhea, nausea and vomiting.  Endocrine: Negative for hot flashes.  Skin: Negative for itching and rash.  Hematological: Negative for adenopathy. Does not bruise/bleed easily.  Psychiatric/Behavioral: Negative for depression. The patient is not nervous/anxious.       PHYSICAL EXAMINATION  ECOG PERFORMANCE STATUS: 1 - Symptomatic but completely ambulatory  Vitals:   07/30/17 1305  BP: 128/82  Pulse: 76  Resp: 18  Temp: (!) 97.5 F (36.4 C)  SpO2: 98%    Physical Exam  Constitutional: She is oriented to person, place, and time and well-developed, well-nourished, and in no distress.  HENT:  Head: Normocephalic and atraumatic.  Mouth/Throat: Oropharynx is clear and moist. No oropharyngeal exudate.  Eyes: Pupils are equal, round, and reactive to light. No scleral icterus.  Neck: Neck supple.  Cardiovascular: Normal rate, regular rhythm and normal heart sounds.  Pulmonary/Chest:  Effort normal and breath sounds normal.  Right breast without nodules, masses, skin or nipple changes.  Left breast lumpectomy site + tenderness to palpation, no nodularity noted, also, some tenderness noted at 5 o'clock in the breast, again no nodularity noted, otherwise no nodules masses tenderness in the breast  Abdominal: Soft. Bowel sounds are normal. She exhibits no distension. There is no tenderness.  Musculoskeletal: She exhibits no edema.  Lymphadenopathy:    She has  no cervical adenopathy.  Neurological: She is alert and oriented to person, place, and time.  Skin: Skin is warm and dry. No rash noted. No erythema.    LABORATORY DATA:  CBC    Component Value Date/Time   WBC 4.2 12/31/2016 1427   RBC 3.59 (L) 12/31/2016 1427   HGB 10.7 (L) 12/31/2016 1427   HGB 11.2 (L) 08/18/2016 1032   HCT 32.9 (L) 12/31/2016 1427   HCT 33.8 (L) 08/18/2016 1032   PLT 282.0 12/31/2016 1427   PLT 242 08/18/2016 1032   MCV 91.8 12/31/2016 1427   MCV 93.8 08/18/2016 1032   MCH 29.4 12/19/2016 1613   MCHC 32.4 12/31/2016 1427   RDW 14.0 12/31/2016 1427   RDW 13.7 08/18/2016 1032   LYMPHSABS 1.5 12/31/2016 1427   LYMPHSABS 1.4 08/18/2016 1032   MONOABS 0.3 12/31/2016 1427   MONOABS 0.4 08/18/2016 1032   EOSABS 0.0 12/31/2016 1427   EOSABS 0.1 08/18/2016 1032   BASOSABS 0.0 12/31/2016 1427   BASOSABS 0.0 08/18/2016 1032    CMP     Component Value Date/Time   NA 139 06/28/2017 0959   NA 142 08/18/2016 1032   K 4.7 06/28/2017 0959   K 4.6 08/18/2016 1032   CL 104 06/28/2017 0959   CL 107 10/20/2012 1102   CO2 28 06/28/2017 0959   CO2 26 08/18/2016 1032   GLUCOSE 97 06/28/2017 0959   GLUCOSE 86 08/18/2016 1032   GLUCOSE 85 10/20/2012 1102   BUN 25 (H) 06/28/2017 0959   BUN 22.5 08/18/2016 1032   CREATININE 1.06 06/28/2017 0959   CREATININE 1.1 08/18/2016 1032   CALCIUM 9.6 06/28/2017 0959   CALCIUM 9.8 06/28/2017 0959   CALCIUM 9.8 08/18/2016 1032   PROT 7.3 12/19/2016 1613    PROT 7.4 08/18/2016 1032   ALBUMIN 3.7 12/19/2016 1613   ALBUMIN 3.8 08/18/2016 1032   AST 27 12/19/2016 1613   AST 32 08/18/2016 1032   ALT 23 12/19/2016 1613   ALT 30 08/18/2016 1032   ALKPHOS 73 12/19/2016 1613   ALKPHOS 78 08/18/2016 1032   BILITOT 0.6 12/19/2016 1613   BILITOT 0.66 08/18/2016 1032   GFRNONAA 43 (L) 12/19/2016 1613   GFRAA 50 (L) 12/19/2016 1613          ASSESSMENT and PLAN:   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 Currently on observation  Breast cancer surveillance: 1. Breast and chest wall exam 08/18/2016 is normal 2. Mammogram 10/08/2015 is normal 3. Bone density test September 2015 showed a T score of -0.7 normal range  For her pain I ordered a diagnostic mammogram and ultrasound today.  I also ordered a chest xray to be done.  We will discuss these results and then decide if any other imaging is needed.  Due to her pain, and Tylenol/Advil not alleviating it, I prescribed a short course of Tramadol, Q12H PRN #20--side effects reviewed with her in detail.  [name] verbalizes understanding as she has taken Tramadol previously.  [place] CSRS reviewed, she has not received another controlled substance recently.  I reviewed the prescription with Dr. Jana Hakim in detail who agrees with its appropriateness.     Orders Placed This Encounter  Procedures  .  MM DIAG BREAST TOMO UNI LEFT    [facility]//EPIC ORDER PF @ [facility] 10/12/16//breast cancer, breast pain at 5 o'clock, and lumpectomy site/NO NEEDS/SB W/LORI @ OFFICE     Standing Status:   Future    Standing Expiration Date:   09/30/2018    Order Specific  Question:   Reason for Exam (SYMPTOM  OR DIAGNOSIS REQUIRED)    Answer:   breast cancer, breast pain at 5 o'clock, and lumpectomy site    Order Specific Question:   Preferred imaging location?    Answer:   [facility]  . US BREAST LTD UNI LEFT INC AXILLA    [facility]//EPIC ORDER PF @ [facility] 10/12/16//breast cancer, breast pain at 5 o'clock, and lumpectomy site/NO NEEDS/SB W/LORI @ OFFICE    Standing Status:   Future    Standing Expiration Date:   09/30/2018    Order Specific Question:   Reason for Exam (SYMPTOM  OR DIAGNOSIS REQUIRED)    Answer:   breast cancer, breast pain at 5 o'clock, and lumpectomy site    Order Specific Question:   Preferred imaging location?    Answer:   [facility]  . DG Chest 2 View    Standing Status:   Future    Number of Occurrences:   1    Standing Expiration Date:   07/30/2018    Order Specific Question:   Reason for Exam (SYMPTOM  OR DIAGNOSIS REQUIRED)    Answer:   chest discomfort, breast cancer    Order Specific Question:   Preferred imaging location?    Answer:   [facility]    Order Specific Question:   Radiology Contrast Protocol - do NOT remove file path    Answer:   [email or web address]    All questions were answered. The patient knows to call the clinic with any problems, questions or concerns. We can certainly see the patient much sooner if necessary.  A total of (30) minutes of face-to-face time was spent with this patient with greater than 50% of that time in counseling and care-coordination.  This note was electronically signed. Scot Dock, NP 07/30/2017

## 2017-08-02 ENCOUNTER — Telehealth: Payer: Self-pay | Admitting: Adult Health

## 2017-08-02 NOTE — Telephone Encounter (Signed)
Per 3/1 los cancel LC appoinment

## 2017-08-09 ENCOUNTER — Encounter: Payer: Self-pay | Admitting: Genetics

## 2017-08-18 ENCOUNTER — Encounter: Payer: Medicare Other | Admitting: Adult Health

## 2017-08-18 ENCOUNTER — Ambulatory Visit (HOSPITAL_COMMUNITY)
Admission: RE | Admit: 2017-08-18 | Discharge: 2017-08-18 | Disposition: A | Discharge status: Home / Self Care | Payer: Medicare Other | Source: Ambulatory Visit | Attending: Adult Health | Admitting: Adult Health

## 2017-08-18 ENCOUNTER — Encounter: Payer: Self-pay | Admitting: Adult Health

## 2017-08-18 ENCOUNTER — Inpatient Hospital Stay: Payer: Medicare Other

## 2017-08-18 ENCOUNTER — Inpatient Hospital Stay (HOSPITAL_BASED_OUTPATIENT_CLINIC_OR_DEPARTMENT_OTHER): Payer: Medicare Other | Admitting: Adult Health

## 2017-08-18 VITALS — BP 113/89 | HR 71 | Temp 97.5°F | Resp 18 | Ht 64.5 in | Wt 204.0 lb

## 2017-08-18 DIAGNOSIS — Z86000 Personal history of in-situ neoplasm of breast: Secondary | ICD-10-CM

## 2017-08-18 DIAGNOSIS — M25512 Pain in left shoulder: Secondary | ICD-10-CM

## 2017-08-18 DIAGNOSIS — M47892 Other spondylosis, cervical region: Secondary | ICD-10-CM | POA: Insufficient documentation

## 2017-08-18 DIAGNOSIS — M542 Cervicalgia: Secondary | ICD-10-CM

## 2017-08-18 DIAGNOSIS — M11212 Other chondrocalcinosis, left shoulder: Secondary | ICD-10-CM | POA: Diagnosis not present

## 2017-08-18 DIAGNOSIS — M779 Enthesopathy, unspecified: Secondary | ICD-10-CM | POA: Diagnosis not present

## 2017-08-18 DIAGNOSIS — M4802 Spinal stenosis, cervical region: Secondary | ICD-10-CM | POA: Diagnosis not present

## 2017-08-18 DIAGNOSIS — M19012 Primary osteoarthritis, left shoulder: Secondary | ICD-10-CM | POA: Diagnosis not present

## 2017-08-18 DIAGNOSIS — C50312 Malignant neoplasm of lower-inner quadrant of left female breast: Secondary | ICD-10-CM

## 2017-08-18 DIAGNOSIS — Z9012 Acquired absence of left breast and nipple: Secondary | ICD-10-CM | POA: Diagnosis not present

## 2017-08-18 DIAGNOSIS — N644 Mastodynia: Secondary | ICD-10-CM | POA: Diagnosis not present

## 2017-08-18 LAB — COMPREHENSIVE METABOLIC PANEL
ALT: 17 U/L (ref 0–55)
AST: 23 U/L (ref 5–34)
Albumin: 3.6 g/dL (ref 3.5–5.0)
Alkaline Phosphatase: 75 U/L (ref 40–150)
Anion gap: 7 (ref 3–11)
BUN: 18 mg/dL (ref 7–26)
CHLORIDE: 109 mmol/L (ref 98–109)
CO2: 26 mmol/L (ref 22–29)
CREATININE: 1.08 mg/dL (ref 0.60–1.10)
Calcium: 9.7 mg/dL (ref 8.4–10.4)
GFR calc non Af Amer: 50 mL/min — ABNORMAL LOW (ref >60)
GFR, EST AFRICAN AMERICAN: 58 mL/min — AB (ref >60)
Glucose, Bld: 75 mg/dL (ref 70–140)
Potassium: 4.1 mmol/L (ref 3.5–5.1)
SODIUM: 142 mmol/L (ref 136–145)
Total Bilirubin: 0.7 mg/dL (ref 0.2–1.2)
Total Protein: 7.1 g/dL (ref 6.4–8.3)

## 2017-08-18 LAB — CBC WITH DIFFERENTIAL/PLATELET
Basophils Absolute: 0 10*3/uL (ref 0.0–0.1)
Basophils Relative: 0 %
EOS ABS: 0 10*3/uL (ref 0.0–0.5)
Eosinophils Relative: 2 %
HEMATOCRIT: 32.6 % — AB (ref 34.8–46.6)
HEMOGLOBIN: 10.7 g/dL — AB (ref 11.6–15.9)
LYMPHS ABS: 1.3 10*3/uL (ref 0.9–3.3)
LYMPHS PCT: 40 %
MCH: 30 pg (ref 25.1–34.0)
MCHC: 32.7 g/dL (ref 31.5–36.0)
MCV: 91.8 fL (ref 79.5–101.0)
MONOS PCT: 12 %
Monocytes Absolute: 0.4 10*3/uL (ref 0.1–0.9)
NEUTROS ABS: 1.4 10*3/uL — AB (ref 1.5–6.5)
NEUTROS PCT: 46 %
Platelets: 237 10*3/uL (ref 145–400)
RBC: 3.55 MIL/uL — ABNORMAL LOW (ref 3.70–5.45)
RDW: 13.5 % (ref 11.2–14.5)
WBC: 3.1 10*3/uL — ABNORMAL LOW (ref 3.9–10.3)

## 2017-08-18 MED ORDER — CELECOXIB 200 MG PO CAPS
200.0000 mg | ORAL_CAPSULE | Freq: Every day | ORAL | 0 refills | Status: DC
Start: 2017-08-18 — End: 2017-12-22

## 2017-08-18 MED ORDER — OXYCODONE-ACETAMINOPHEN 5-325 MG PO TABS: 1.0000 | ORAL_TABLET | ORAL | 0 refills | Status: DC | PRN

## 2017-08-18 NOTE — Assessment & Plan Note (Signed)
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 Currently on observation  Breast cancer surveillance: 1. Breast and chest wall exam 08/18/2016 is normal 2. Left breast diagnostic mammogram 07/30/17 is normal 3. Bone density test September 2015 showed a T score of -0.7 normal range  [name] is in increased pain due to her left shoulder.  See xray results above, concerning for a subacromial spur.  [name].  I put her on low dose celebrex daily with food x 1 week (lower dose due to h/o GI issues), Percocet #20 since Tramadol isn't working to relieve the pain (reviewed with Dr. Lindi Adie in detail, and [place] CSRS reviewed in detail showing no concerns for abuse), and also placed a referral for an urgent ortho evaluation.

## 2017-08-18 NOTE — Progress Notes (Signed)
[facility] Follow up:    Renato Shin, MD 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 Currently on observation  CURRENT THERAPY: observation  INTERVAL HISTORY: [name] 72 y.o. female returns for urgent evaluation of her shoulder.  She was here getting lab work when she requested to see me.  She had some breast pain in her left breast at her previous lumpectomy site and that has since resolved.  She did undergo diagnostic mammogram for that breast pain that was negative.  She also underwent chest xray that was normal.  She has a different pain, that she says may be slightly similar, but states it has migrated in her shoulder.  This has been going on x 4 days.  This pain is constant, and she cannot recall a precipitating event or trigger.  She notes that Tramadol is not helping to alleviate the pain.  She is able to move her arm somewhat, but not without increasing discomfort and pain.  She is wanting to get to the bottom of this as soon as possible.     Patient Active Problem List   Diagnosis Date Noted  . Breast cancer ([place]) 09/29/2011    Priority: High  . 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 UTI (urinary tract infection) 11/27/2014  . UTI (lower urinary tract infection) 11/27/2014  . Acute  cystitis with hematuria 11/13/2014  . Hemorrhoids, external 11/04/2014  . Wellness examination 10/03/2014  . Pain in joint, shoulder region 09/24/2014  . Cramp in limb 06/26/2014  . Rectal bleeding 05/02/2014  . External hemorrhoids 05/02/2014  . Hemorrhoids without complication 80/88/1103  . Pain in joint, lower leg 02/01/2014  . Vitamin D deficiency 01/16/2014  . Gastroparesis 09/14/2013  . Other dysphagia 09/04/2013  . Nausea alone 09/04/2013  . Iron deficiency anemia 08/28/2013  . Hypomagnesemia 05/11/2013  . Hypokalemia 05/11/2013  . Generalized weakness 05/11/2013  . Dehydration 11/25/2011  . Fatigue 11/16/2011  . Family history of breast cancer 08/19/2011  . Primary cancer of lower-inner quadrant of left female breast ([place]) 08/17/2011  . Neck pain 07/29/2011  . Routine general medical examination at a health care facility 06/28/2011  . Edema 11/26/2010  . CLOSTRIDIUM DIFFICILE COLITIS 02/07/2010  . Blind loop syndrome 10/01/2009  . Abdominal pain 10/01/2009  . Vitamin B 12 deficiency 08/30/2009  . 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 Rash and nonspecific skin eruption 08/13/2009  . GOITER, MULTINODULAR 04/02/2009  . CONTACT DERMATITIS&OTHER ECZEMA DUE UNSPEC CAUSE 02/20/2009  . URINARY CALCULUS 10/11/2008  . Asymptomatic menopausal state 10/11/2008  . BACK PAIN, CHRONIC 07/03/2008  . Esophageal reflux 06/12/2008  . Diarrhea 06/12/2008  . ABDOMINAL PAIN-RUQ 05/17/2008  . Diabetes ([place]) 12/10/2006  . Dyslipidemia 12/10/2006  . Gout 12/10/2006  . HYPERTENSION 12/10/2006  . DIVERTICULOSIS, COLON 12/10/2006  . OSTEOARTHRITIS 12/10/2006  . ESOPHAGEAL STRICTURE 08/23/2002  . HIATAL HERNIA 08/23/2002  .  INTERNAL HEMORRHOIDS 02/02/2001    is allergic to aspirin; trazodone and nefazodone; flagyl [metronidazole]; iodine; and morphine and related.  MEDICAL HISTORY: Past Medical History:  Diagnosis Date  . ANEMIA, IRON DEFICIENCY 05/08/2009  .  Angina   . ASYMPTOMATIC POSTMENOPAUSAL STATUS 10/11/2008  . Blood transfusion   . Blood transfusion without reported diagnosis   . Breast cancer ([place]) 09/29/11   invasive grade III ductal ca,assoc high grade dcis,ER/PR=neg  . C. difficile colitis   . Diverticulosis of colon (without mention of hemorrhage)   . Esophageal reflux 06/12/2008  . Gastroparesis   . GOITER, MULTINODULAR 04/02/2009  . Gout, unspecified   . H/O hiatal hernia   . History of lower GI bleeding   . History of radiation therapy 02/08/12-03/25/12   left breast,total 61gy  . Hypokalemia 05/11/2013  . Hypomagnesemia   . HYPOTHYROIDISM, POST-RADIATION 08/13/2009  . Internal hemorrhoids without mention of complication   . Kidney stones    "several"  . Leukopenia   . Migraines   . Obesity   . Osteoarthrosis, unspecified whether generalized or localized, unspecified site   . Other and unspecified hyperlipidemia   . Personal history of chemotherapy 2013  . Personal history of radiation therapy 2013   left  . PONV (postoperative nausea and vomiting)   . PUD (peptic ulcer disease)   . Short bowel syndrome   . Shortness of breath on exertion    "sometimes"  . Stricture and stenosis of esophagus   . Thyrotoxicosis without mention of goiter or other cause, without mention of thyrotoxic crisis or storm   . Type II or unspecified type diabetes mellitus without mention of complication, not stated as uncontrolled    no med in years diet controled  . Unspecified essential hypertension   . UTI (urinary tract infection)   . Varicose veins   . VITAMIN B12 DEFICIENCY 08/30/2009    SURGICAL HISTORY: Past Surgical History:  Procedure Laterality Date  . ABDOMINAL ADHESION SURGERY  1980's thru 1990's   "several"  . ABDOMINAL HYSTERECTOMY  1970's   with BSO  . BREAST BIOPSY Left 08/13/11   left breast lower inner quadrant  . BREAST BIOPSY Right 1985   Rt exc bx, benign  . BREAST LUMPECTOMY Left 08/2011  . BREAST LUMPECTOMY W/  NEEDLE LOCALIZATION  09/29/11   left  breast=lymph node,excision benign/ ER/PR=neg, her 2 Positive  . BUNIONECTOMY  1970's   bilateral  . CHOLECYSTECTOMY  1990's  . COLON SURGERY     "several surgeries for short bowel syndrome"  . COLONOSCOPY  2012   multiple   . DILATION AND CURETTAGE OF UTERUS    . ESOPHAGOGASTRODUODENOSCOPY  2011   multiple   . EXCISIONAL HEMORRHOIDECTOMY  11/10/2016  . EYE SURGERY     "long time ago"  . FLEXIBLE SIGMOIDOSCOPY  2011   multiple   . KIDNEY STONE SURGERY  1990's   "tried to go up & get it but pushed it further up"  . LITHOTRIPSY     "4 or 5 times"  . MASTECTOMY W/ NODES PARTIAL  09/29/11   left  . PORT-A-CATH REMOVAL Right 12/19/2013   Procedure: MINOR REMOVAL PORT-A-CATH;  Surgeon: Adin Hector, MD;  Location: [facility];  Service: General;  Laterality: Right;  . PORTACATH PLACEMENT  09/29/2011   Procedure: INSERTION PORT-A-CATH;  Surgeon: Adin Hector, MD;  Location: [facility];  Service: General;  Laterality: N/A;  . Thyroid Ultrasound  12/1994 and 12/1995  . TOTAL  KNEE ARTHROPLASTY Right 06/05/2015   Procedure: RIGHT TOTAL KNEE ARTHROPLASTY;  Surgeon: Ninetta Lights, MD;  Location: [facility];  Service: Orthopedics;  Laterality: Right;  . VEIN LIGATION AND STRIPPING  1980's   Right leg    SOCIAL HISTORY: Social History   Socioeconomic History  . Marital status: Married    Spouse name: Not on file  . Number of children: 2  . Years of education: Not on file  . Highest education level: Not on file  Social Needs  . Financial resource strain: Not on file  . Food insecurity - worry: Not on file  . Food insecurity - inability: Not on file  . Transportation needs - medical: Not on file  . Transportation needs - non-medical: Not on file  Occupational History  . Occupation: RETIRED  Tobacco Use  . Smoking status: Former Smoker    Packs/day: 1.00    Years: 10.00    Pack years: 10.00    Types: Cigarettes    Last attempt to quit:  09/22/1985    Years since quitting: 31.9  . Smokeless tobacco: Never Used  Substance and Sexual Activity  . Alcohol use: No    Comment: 09/29/11 "used to drink socially years ago"  . Drug use: No  . Sexual activity: No    Comment: HRT x many yrs  Other Topics Concern  . Not on file  Social History Narrative   Pt gets regular exercise    FAMILY HISTORY: Family History  Problem Relation Age of Onset  . Esophageal cancer Son        deceased  . Diabetes Mother   . Heart disease Mother   . Kidney disease Sister   . Diabetes Father   . Hypertension Father   . Kidney disease Brother        x 3  . Colon cancer Paternal Uncle   . Breast cancer Maternal Aunt   . Breast cancer Paternal Aunt   . Breast cancer Cousin        Pt states she has 15+ cousins w/ Breast CA    Review of Systems  Constitutional: Negative for appetite change, chills, fatigue, fever and unexpected weight change.  HENT:   Negative for hearing loss.   Eyes: Negative for eye problems and icterus.  Respiratory: Negative for chest tightness, cough and shortness of breath.   Cardiovascular: Negative for chest pain and leg swelling.  Gastrointestinal: Negative for abdominal distention and abdominal pain.  Musculoskeletal: Positive for arthralgias. Negative for gait problem.  Neurological: Negative for dizziness, extremity weakness, gait problem, headaches and numbness.  Hematological: Negative for adenopathy. Does not bruise/bleed easily.  Psychiatric/Behavioral: Negative for depression. The patient is not nervous/anxious.       PHYSICAL EXAMINATION  ECOG PERFORMANCE STATUS: 1 - Symptomatic but completely ambulatory  Vitals:   08/18/17 1030  BP: 113/89  Pulse: 71  Resp: 18  Temp: (!) 97.5 F (36.4 C)  SpO2: 100%    Physical Exam  Constitutional: She is oriented to person, place, and time and well-developed, well-nourished, and in no distress.  HENT:  Head: Normocephalic and atraumatic.  Mouth/Throat:  Oropharynx is clear and moist. No oropharyngeal exudate.  Eyes: Pupils are equal, round, and reactive to light. No scleral icterus.  Neck: Neck supple.  Cardiovascular: Normal rate, regular rhythm and normal heart sounds.  Pulmonary/Chest: Effort normal and breath sounds normal. No respiratory distress. She has no wheezes.  Abdominal: Soft. Bowel sounds are normal. She exhibits no distension.  There is no tenderness.  Musculoskeletal:  Left shoulder with difficulty in raising arm past 90 degrees, tenderness throughout shoulder musculature, including trapezius, rotator cuff, and acromion, no focal spinal tenderness in cervical or thoracic spine  Lymphadenopathy:    She has no cervical adenopathy.  Neurological: She is alert and oriented to person, place, and time.  Skin: Skin is warm and dry. No rash noted. No erythema.  Psychiatric: Affect normal.    LABORATORY DATA:  CBC    Component Value Date/Time   WBC 3.1 (L) 08/18/2017 1002   RBC 3.55 (L) 08/18/2017 1002   HGB 10.7 (L) 08/18/2017 1002   HGB 11.2 (L) 08/18/2016 1032   HCT 32.6 (L) 08/18/2017 1002   HCT 33.8 (L) 08/18/2016 1032   PLT 237 08/18/2017 1002   PLT 242 08/18/2016 1032   MCV 91.8 08/18/2017 1002   MCV 93.8 08/18/2016 1032   MCH 30.0 08/18/2017 1002   MCHC 32.7 08/18/2017 1002   RDW 13.5 08/18/2017 1002   RDW 13.7 08/18/2016 1032   LYMPHSABS 1.3 08/18/2017 1002   LYMPHSABS 1.4 08/18/2016 1032   MONOABS 0.4 08/18/2017 1002   MONOABS 0.4 08/18/2016 1032   EOSABS 0.0 08/18/2017 1002   EOSABS 0.1 08/18/2016 1032   BASOSABS 0.0 08/18/2017 1002   BASOSABS 0.0 08/18/2016 1032    CMP     Component Value Date/Time   NA 142 08/18/2017 1002   NA 142 08/18/2016 1032   K 4.1 08/18/2017 1002   K 4.6 08/18/2016 1032   CL 109 08/18/2017 1002   CL 107 10/20/2012 1102   CO2 26 08/18/2017 1002   CO2 26 08/18/2016 1032   GLUCOSE 75 08/18/2017 1002   GLUCOSE 86 08/18/2016 1032   GLUCOSE 85 10/20/2012 1102   BUN 18  08/18/2017 1002   BUN 22.5 08/18/2016 1032   CREATININE 1.08 08/18/2017 1002   CREATININE 1.1 08/18/2016 1032   CALCIUM 9.7 08/18/2017 1002   CALCIUM 9.8 08/18/2016 1032   PROT 7.1 08/18/2017 1002   PROT 7.4 08/18/2016 1032   ALBUMIN 3.6 08/18/2017 1002   ALBUMIN 3.8 08/18/2016 1032   AST 23 08/18/2017 1002   AST 32 08/18/2016 1032   ALT 17 08/18/2017 1002   ALT 30 08/18/2016 1032   ALKPHOS 75 08/18/2017 1002   ALKPHOS 78 08/18/2016 1032   BILITOT 0.7 08/18/2017 1002   BILITOT 0.66 08/18/2016 1032   GFRNONAA 50 (L) 08/18/2017 1002   GFRAA 58 (L) 08/18/2017 1002    RADIOGRAPHIC STUDIES:  Dg Cervical Spine 2-3 Views  Result Date: 08/18/2017 CLINICAL DATA:  Left-sided shoulder and neck pain worsening over the last 4 days. EXAM: CERVICAL SPINE - 2-3 VIEW COMPARISON:  04/17/2016 FINDINGS: No evidence of fracture or malalignment. Ordinary spondylosis with disc space narrowing and osteophyte formation from C3-4 through C6-7. No evidence of facet arthropathy or other focal finding. IMPRESSION: Ordinary mid cervical spondylosis, similar to the study of 2017. No acute finding. Electronically Signed   By: Nelson Chimes M.D.   On: 08/18/2017 11:18   Dg Shoulder Left  Result Date: 08/18/2017 CLINICAL DATA:  Left-sided shoulder and neck pain worsening over the last 4 days. EXAM: LEFT SHOULDER - 2+ VIEW COMPARISON:  05/21/2008. FINDINGS: Glenohumeral joint does not show any degenerative narrowing or osteophyte formation. Ordinary age related chondrocalcinosis of the articular cartilage. Subacromial spur could predispose to rotator cuff disease. AC joint appears normal. Regional ribs appear normal. IMPRESSION: Subacromial spur which could predispose to rotator cuff disease. Ordinary age  related chondrocalcinosis without significant glenohumeral degenerative changes by radiography. Electronically Signed   By: Nelson Chimes M.D.   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 Currently on observation  Breast cancer surveillance: 1. Breast and chest wall exam 08/18/2016 is normal 2. Left breast diagnostic mammogram 07/30/17 is normal 3. Bone density test September 2015 showed a T score of -0.7 normal range  [name] is in increased pain due to her left shoulder.  See xray results above, concerning for a subacromial spur.  [name].  I put her on low dose celebrex daily with food x 1 week (lower dose due to h/o GI issues), Percocet #20 since Tramadol isn't working to relieve the pain (reviewed with Dr. Lindi Adie in detail, and [place] CSRS reviewed in detail showing no concerns for abuse), and also placed a referral for an urgent ortho evaluation.     Orders Placed This Encounter  Procedures  . DG Shoulder Left    Include scapula    Standing Status:   Future    Number of Occurrences:   1    Standing Expiration Date:   10/19/2018    Order Specific Question:   Reason for Exam (SYMPTOM  OR DIAGNOSIS REQUIRED)    Answer:   shoulder pain, h/o breast cancer    Order Specific Question:   Preferred imaging location?    Answer:   [facility]    Order Specific Question:   Radiology Contrast Protocol - do NOT remove file path    Answer:   \\[email or web address]  . DG Cervical Spine 2-3 Views    Standing Status:   Future    Number of Occurrences:   1    Standing Expiration Date:   08/18/2018    Order Specific Question:   Reason for Exam (SYMPTOM  OR DIAGNOSIS REQUIRED)    Answer:   neck pain, h/o breast cancer    Order Specific  Question:   Preferred imaging location?    Answer:   [facility]    Order Specific Question:   Radiology Contrast Protocol - do NOT remove file path    Answer:   \\[email or web address]  . AMB referral to orthopedics    Referral Priority:   Urgent    Referral Type:   Consultation    Number of Visits Requested:   1    All questions were answered. The patient knows to call the clinic with any problems, questions or concerns. We can certainly see the patient much sooner if necessary.  A total of (30) minutes of face-to-face time was spent with this patient with greater than 50% of that time in counseling and care-coordination.  This note was electronically signed. Scot Dock, NP 08/18/2017

## 2017-08-19 ENCOUNTER — Telehealth: Payer: Self-pay | Admitting: Adult Health

## 2017-08-19 NOTE — Telephone Encounter (Signed)
Per 3/20 no los

## 2017-08-30 ENCOUNTER — Other Ambulatory Visit (INDEPENDENT_AMBULATORY_CARE_PROVIDER_SITE_OTHER): Payer: Self-pay | Admitting: Radiology

## 2017-08-30 ENCOUNTER — Encounter (INDEPENDENT_AMBULATORY_CARE_PROVIDER_SITE_OTHER): Payer: Self-pay | Admitting: Orthopaedic Surgery

## 2017-08-30 ENCOUNTER — Ambulatory Visit (INDEPENDENT_AMBULATORY_CARE_PROVIDER_SITE_OTHER): Payer: Medicare Other | Admitting: Orthopaedic Surgery

## 2017-08-30 VITALS — BP 112/78 | HR 73 | Resp 16 | Ht 65.0 in | Wt 200.0 lb

## 2017-08-30 DIAGNOSIS — M542 Cervicalgia: Secondary | ICD-10-CM

## 2017-08-30 DIAGNOSIS — M25512 Pain in left shoulder: Secondary | ICD-10-CM

## 2017-08-30 DIAGNOSIS — G8929 Other chronic pain: Secondary | ICD-10-CM

## 2017-08-30 MED ORDER — LIDOCAINE HCL 2 % IJ SOLN
2.0000 mL | INTRAMUSCULAR | Status: AC | PRN
  Administered 2017-08-30: 2 mL

## 2017-08-30 MED ORDER — HYDROCODONE-ACETAMINOPHEN 5-325 MG PO TABS: ORAL_TABLET | ORAL | 0 refills | Status: DC

## 2017-08-30 MED ORDER — BUPIVACAINE HCL 0.5 % IJ SOLN
2.0000 mL | INTRAMUSCULAR | Status: AC | PRN
  Administered 2017-08-30: 2 mL via INTRA_ARTICULAR

## 2017-08-30 MED ORDER — METHYLPREDNISOLONE ACETATE 40 MG/ML IJ SUSP
80.0000 mg | INTRAMUSCULAR | Status: AC | PRN
  Administered 2017-08-30: 80 mg

## 2017-08-30 MED ORDER — OXYCODONE-ACETAMINOPHEN 5-325 MG PO TABS: ORAL_TABLET | ORAL | 0 refills | Status: DC

## 2017-08-30 NOTE — Progress Notes (Signed)
Office Visit Note   Patient: [name]           Date of Birth: Dec 01, 1945           MRN: [id number] Visit Date: 08/30/2017              Requested by: Gardenia Phlegm, NP [address] [place], [place] [address] PCP: Renato Shin, MD   Assessment & Plan: Visit Diagnoses:  1. Chronic left shoulder pain     Plan: Insidious onset left shoulder pain approximately 1 month ago.  X-rays were obtained at her oncologist office demonstrating some spurring about the acromion.  Referred for further evaluation.  Could have a rotator cuff tear.  We will try a subacromial cortisone injection and monitor response over the next 2 weeks.  Oxycodone 10/02/2023 #20.  Long discussion over approximately 45 minutes regarding the potential diagnoses.  Treatment options including MRI scan if the cortisone injection is not effective  Follow-Up Instructions: Return in about 2 weeks (around 09/13/2017).   Orders:  Orders Placed This Encounter  Procedures  . Large Joint Inj: L subacromial bursa   No orders of the defined types were placed in this encounter.     Procedures: Large Joint Inj: L subacromial bursa on 08/30/2017 9:54 AM Indications: pain and diagnostic evaluation Details: 25 G 1.5 in needle, anterolateral approach  Arthrogram: No  Medications: 2 mL lidocaine 2 %; 2 mL bupivacaine 0.5 %; 80 mg methylPREDNISolone acetate 40 MG/ML Consent was given by the patient. Immediately prior to procedure a time out was called to verify the correct patient, procedure, equipment, support staff and site/side marked as required. Patient was prepped and draped in the usual sterile fashion.       Clinical Data: No additional findings.   Subjective: Chief Complaint  Patient presents with  . Right Shoulder - Pain  . New Patient (Initial Visit)    R SHOULDER PAIN FOR 1 MONTH, NO INJURY   Insidious onset of left shoulder pain approximately a month ago.  No injury or trauma.  Having  difficulty raising arm arm over her head related to pain.  Has a history of left breast cancer treated by a local oncologist.  Was seen about 2 weeks ago with x-rays of her shoulder demonstrating some acromial spurring.  No evidence of tumor.  Placed on oxycodone and referred to the office.  Some neck stiffness.  No numbness or tingling. I did review the films of her neck and shoulder.  There is some ectopic calcification within the joint possibly consistent with chondrocalcinosis.  There is some subacromial spurring and some acromioclavicular joint arthritis.  Humeral head is centered about the glenoid.  No acute cervical spine changes.  There is some mid cervical spine spondylosis particularly at C3-4 through C6-7  HPI  Review of Systems  Constitutional: Negative for fatigue and fever.  HENT: Negative for ear pain.   Eyes: Negative for pain.  Respiratory: Positive for shortness of breath. Negative for cough.   Cardiovascular: Positive for leg swelling.  Gastrointestinal: Positive for diarrhea. Negative for blood in stool and constipation.  Genitourinary: Negative for difficulty urinating.  Musculoskeletal: Positive for back pain and neck stiffness.  Skin: Negative for rash and wound.  Allergic/Immunologic: Negative for food allergies.  Neurological: Positive for weakness. Negative for dizziness, light-headedness, numbness and headaches.  Hematological: Does not bruise/bleed easily.  Psychiatric/Behavioral: Positive for sleep disturbance.     Objective: Vital Signs: BP 112/78 (BP Location: Right Arm,  Patient Position: Sitting, Cuff Size: Normal)   Pulse 73   Resp 16   Ht 5\' 5"  (1.651 m)   Wt 200 lb (90.7 kg)   BMI 33.28 kg/m   Physical Exam  Ortho Exam awake alert and oriented x3.  Comfortable sitting.  Limited range of motion of cervical spine.  Lacks of fingerbreadths from touching her chin to her chest.  About 50% of normal neck extension.  No referred pain to either shoulder.   About 50% of normal rotation to the right and to the left again without referred pain to either shoulder. Positive impingement and empty can testing right shoulder.  No crepitation.  Able to place her arm overhead with a circuitous motion and pain.  Good strength with internal and external rotation.  Biceps appears to be in its normal position.  Good grip and release.  Neurovascular exam intact  Specialty Comments:  No specialty comments available.  Imaging: No results found.   PMFS History: Patient Active Problem List   Diagnosis Date Noted  . 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 UTI (urinary tract infection) 11/27/2014  . UTI (lower urinary tract infection) 11/27/2014  . Acute cystitis with hematuria 11/13/2014  . Hemorrhoids, external 11/04/2014  . Wellness examination 10/03/2014  . Pain in joint, shoulder region 09/24/2014  . Cramp in limb 06/26/2014  . Rectal bleeding 05/02/2014  . External hemorrhoids 05/02/2014  . Hemorrhoids without complication 19/41/7408  . Pain in joint, lower leg 02/01/2014  . Vitamin D deficiency 01/16/2014  . Gastroparesis 09/14/2013  . Other dysphagia 09/04/2013  . Nausea alone 09/04/2013  . Iron deficiency anemia 08/28/2013  . Hypomagnesemia 05/11/2013  . Hypokalemia 05/11/2013  . Generalized weakness 05/11/2013  . Dehydration 11/25/2011  . Fatigue 11/16/2011  . Breast cancer ([facility]) 09/29/2011  . Family history of breast cancer 08/19/2011  . Primary cancer of lower-inner quadrant of left female breast ([facility]) 08/17/2011  . Neck pain 07/29/2011  . Routine general medical examination at a health care facility 06/28/2011  . Edema 11/26/2010  . CLOSTRIDIUM DIFFICILE COLITIS 02/07/2010  . Blind loop syndrome 10/01/2009  . Abdominal pain 10/01/2009  . Vitamin B 12 deficiency 08/30/2009  . 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 Rash and nonspecific skin eruption 08/13/2009  . GOITER, MULTINODULAR 04/02/2009    . CONTACT DERMATITIS&OTHER ECZEMA DUE UNSPEC CAUSE 02/20/2009  . URINARY CALCULUS 10/11/2008  . Asymptomatic menopausal state 10/11/2008  . BACK PAIN, CHRONIC 07/03/2008  . Esophageal reflux 06/12/2008  . Diarrhea 06/12/2008  . ABDOMINAL PAIN-RUQ 05/17/2008  . Diabetes ([facility]) 12/10/2006  . Dyslipidemia 12/10/2006  . Gout 12/10/2006  . HYPERTENSION 12/10/2006  . DIVERTICULOSIS, COLON 12/10/2006  . OSTEOARTHRITIS 12/10/2006  . ESOPHAGEAL STRICTURE 08/23/2002  . HIATAL HERNIA 08/23/2002  . INTERNAL HEMORRHOIDS 02/02/2001   Past Medical History:  Diagnosis Date  . ANEMIA, IRON DEFICIENCY 05/08/2009  . Angina   . ASYMPTOMATIC POSTMENOPAUSAL STATUS 10/11/2008  . Blood transfusion   . Blood transfusion without reported diagnosis   . Breast cancer ([facility]) 09/29/11   invasive grade III ductal ca,assoc high grade dcis,ER/PR=neg  . C. difficile colitis   . Diverticulosis of colon (without mention of hemorrhage)   . Esophageal reflux 06/12/2008  . Gastroparesis   . GOITER, MULTINODULAR 04/02/2009  . Gout, unspecified   . H/O hiatal hernia   . History of lower GI bleeding   . History of radiation therapy 02/08/12-03/25/12   left breast,total 61gy  . Hypokalemia 05/11/2013  . Hypomagnesemia   .  HYPOTHYROIDISM, POST-RADIATION 08/13/2009  . Internal hemorrhoids without mention of complication   . Kidney stones    "several"  . Leukopenia   . Migraines   . Obesity   . Osteoarthrosis, unspecified whether generalized or localized, unspecified site   . Other and unspecified hyperlipidemia   . Personal history of chemotherapy 2013  . Personal history of radiation therapy 2013   left  . PONV (postoperative nausea and vomiting)   . PUD (peptic ulcer disease)   . Short bowel syndrome   . Shortness of breath on exertion    "sometimes"  . Stricture and stenosis of esophagus   . Thyrotoxicosis without mention of goiter or other cause, without mention of thyrotoxic crisis or storm   . Type II or  unspecified type diabetes mellitus without mention of complication, not stated as uncontrolled    no med in years diet controled  . Unspecified essential hypertension   . UTI (urinary tract infection)   . Varicose veins   . VITAMIN B12 DEFICIENCY 08/30/2009    Family History  Problem Relation Age of Onset  . Esophageal cancer Son        deceased  . Diabetes Mother   . Heart disease Mother   . Kidney disease Sister   . Diabetes Father   . Hypertension Father   . Kidney disease Brother        x 3  . Colon cancer Paternal Uncle   . Breast cancer Maternal Aunt   . Breast cancer Paternal Aunt   . Breast cancer Cousin        Pt states she has 15+ cousins w/ Breast CA    Past Surgical History:  Procedure Laterality Date  . ABDOMINAL ADHESION SURGERY  1980's thru 1990's   "several"  . ABDOMINAL HYSTERECTOMY  1970's   with BSO  . BREAST BIOPSY Left 08/13/11   left breast lower inner quadrant  . BREAST BIOPSY Right 1985   Rt exc bx, benign  . BREAST LUMPECTOMY Left 08/2011  . BREAST LUMPECTOMY W/ NEEDLE LOCALIZATION  09/29/11   left  breast=lymph node,excision benign/ ER/PR=neg, her 2 Positive  . BUNIONECTOMY  1970's   bilateral  . CHOLECYSTECTOMY  1990's  . COLON SURGERY     "several surgeries for short bowel syndrome"  . COLONOSCOPY  2012   multiple   . DILATION AND CURETTAGE OF UTERUS    . ESOPHAGOGASTRODUODENOSCOPY  2011   multiple   . EXCISIONAL HEMORRHOIDECTOMY  11/10/2016  . EYE SURGERY     "long time ago"  . FLEXIBLE SIGMOIDOSCOPY  2011   multiple   . KIDNEY STONE SURGERY  1990's   "tried to go up & get it but pushed it further up"  . LITHOTRIPSY     "4 or 5 times"  . MASTECTOMY W/ NODES PARTIAL  09/29/11   left  . PORT-A-CATH REMOVAL Right 12/19/2013   Procedure: MINOR REMOVAL PORT-A-CATH;  Surgeon: Adin Hector, MD;  Location: [facility];  Service: General;  Laterality: Right;  . PORTACATH PLACEMENT  09/29/2011   Procedure: INSERTION  PORT-A-CATH;  Surgeon: Adin Hector, MD;  Location: [facility];  Service: General;  Laterality: N/A;  . Thyroid Ultrasound  12/1994 and 12/1995  . TOTAL KNEE ARTHROPLASTY Right 06/05/2015   Procedure: RIGHT TOTAL KNEE ARTHROPLASTY;  Surgeon: Ninetta Lights, MD;  Location: [facility];  Service: Orthopedics;  Laterality: Right;  . VEIN LIGATION AND STRIPPING  1980's   Right leg  Social History   Occupational History  . Occupation: RETIRED  Tobacco Use  . Smoking status: Former Smoker    Packs/day: 1.00    Years: 10.00    Pack years: 10.00    Types: Cigarettes    Last attempt to quit: 09/22/1985    Years since quitting: 31.9  . Smokeless tobacco: Never Used  Substance and Sexual Activity  . Alcohol use: No    Comment: 09/29/11 "used to drink socially years ago"  . Drug use: No  . Sexual activity: Never    Comment: HRT x many yrs

## 2017-09-03 ENCOUNTER — Other Ambulatory Visit: Payer: Self-pay | Admitting: Adult Health

## 2017-09-03 DIAGNOSIS — Z139 Encounter for screening, unspecified: Secondary | ICD-10-CM

## 2017-09-13 ENCOUNTER — Ambulatory Visit (INDEPENDENT_AMBULATORY_CARE_PROVIDER_SITE_OTHER): Payer: Medicare Other | Admitting: Orthopaedic Surgery

## 2017-09-13 ENCOUNTER — Encounter (INDEPENDENT_AMBULATORY_CARE_PROVIDER_SITE_OTHER): Payer: Self-pay | Admitting: Orthopaedic Surgery

## 2017-09-13 ENCOUNTER — Other Ambulatory Visit (INDEPENDENT_AMBULATORY_CARE_PROVIDER_SITE_OTHER): Payer: Self-pay | Admitting: Radiology

## 2017-09-13 VITALS — BP 97/70 | HR 80 | Resp 18 | Ht 65.0 in | Wt 200.0 lb

## 2017-09-13 DIAGNOSIS — M25512 Pain in left shoulder: Secondary | ICD-10-CM

## 2017-09-13 DIAGNOSIS — M542 Cervicalgia: Secondary | ICD-10-CM

## 2017-09-13 DIAGNOSIS — G8929 Other chronic pain: Secondary | ICD-10-CM | POA: Diagnosis not present

## 2017-09-13 DIAGNOSIS — M25511 Pain in right shoulder: Secondary | ICD-10-CM | POA: Diagnosis not present

## 2017-09-13 NOTE — Progress Notes (Signed)
Office Visit Note   Patient: [name]           Date of Birth: 12/16/1945           MRN: [id number] Visit Date: 09/13/2017              Requested by: Renato Shin, MD [address] [place], [place] [address] PCP: Renato Shin, MD   Assessment & Plan: Visit Diagnoses:  1. Cervicalgia   2. Chronic pain of both shoulders     Plan: Left shoulder pain has resolved.  Some discomfort on the right shoulder without history of injury or trauma.  Does have some problems with the cervical spine with limited motion.  I believe most of her problem presently originates from her cervical spine.  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  Can use ice or heat, Tylenol and will try a course of physical therapy.  Follow-Up Instructions: Return if symptoms worsen or fail to improve.   Orders:  No orders of the defined types were placed in this encounter.  No orders of the defined types were placed in this encounter.     Procedures: No procedures performed   Clinical Data: No additional findings.   Subjective: Chief Complaint  Patient presents with  . Left Shoulder - Follow-up  . Follow-up    l shouder is ok, sot did real good per pt. Pt. still having neck pain and radiates to right shoulder pain   Seen 2 weeks ago for evaluation of left shoulder pain.  Responded nicely to subacromial cortisone injection.  Presently having some discomfort in the cervical spine with occasional referred pain to right shoulder.  Prior films demonstrate mid cervical arthritis.  Not experiencing any numbness or tingling.  HPI  Review of Systems  Constitutional: Negative for fatigue and fever.  HENT: Negative for ear pain.   Eyes: Negative for pain.  Respiratory: Positive for cough and shortness of breath.   Cardiovascular: Positive for leg swelling.  Gastrointestinal: Positive for diarrhea. Negative for constipation.  Genitourinary: Negative for difficulty urinating.    Musculoskeletal: Positive for neck pain. Negative for back pain.  Skin: Negative for rash and wound.  Allergic/Immunologic: Negative for food allergies.  Neurological: Negative for weakness, light-headedness, numbness and headaches.  Hematological: Does not bruise/bleed easily.  Psychiatric/Behavioral: Positive for sleep disturbance.     Objective: Vital Signs: BP 97/70 (BP Location: Right Arm, Patient Position: Sitting, Cuff Size: Normal)   Pulse 80   Resp 18   Ht 5\' 5"  (1.651 m)   Wt 200 lb (90.7 kg)   BMI 33.28 kg/m   Physical Exam  Ortho Exam awake alert and oriented x3.  Comfortable sitting.  Full overhead motion of both shoulders.  No impingement or pain on left.  Minimally positive impingement on the right.  No localized areas of tenderness to right shoulder.  Good strength.  Limited range of motion of cervical spine in flexion and extension.  About 70% of normal rotation to the left and to the right.  Some referred pain to the right side of her neck and right trapezius with shoulder motion.  Good grip and good release.  Specialty Comments:  No specialty comments available.  Imaging: No results found.   PMFS History: Patient Active Problem List   Diagnosis Date Noted  . 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 UTI (urinary tract infection) 11/27/2014  . UTI (lower urinary tract infection) 11/27/2014  . Acute  cystitis with hematuria 11/13/2014  . Hemorrhoids, external 11/04/2014  . Wellness examination 10/03/2014  . Pain in joint, shoulder region 09/24/2014  . Cramp in limb 06/26/2014  . Rectal bleeding 05/02/2014  . External hemorrhoids 05/02/2014  . Hemorrhoids without complication 74/25/9563  . Pain in joint, lower leg 02/01/2014  . Vitamin D deficiency 01/16/2014  . Gastroparesis 09/14/2013  . Other dysphagia 09/04/2013  . Nausea alone 09/04/2013  . Iron deficiency anemia 08/28/2013  . Hypomagnesemia 05/11/2013  . Hypokalemia 05/11/2013  .  Generalized weakness 05/11/2013  . Dehydration 11/25/2011  . Fatigue 11/16/2011  . Breast cancer ([facility]) 09/29/2011  . Family history of breast cancer 08/19/2011  . Primary cancer of lower-inner quadrant of left female breast ([facility]) 08/17/2011  . Neck pain 07/29/2011  . Routine general medical examination at a health care facility 06/28/2011  . Edema 11/26/2010  . CLOSTRIDIUM DIFFICILE COLITIS 02/07/2010  . Blind loop syndrome 10/01/2009  . Abdominal pain 10/01/2009  . Vitamin B 12 deficiency 08/30/2009  . 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 Rash and nonspecific skin eruption 08/13/2009  . GOITER, MULTINODULAR 04/02/2009  . CONTACT DERMATITIS&OTHER ECZEMA DUE UNSPEC CAUSE 02/20/2009  . URINARY CALCULUS 10/11/2008  . Asymptomatic menopausal state 10/11/2008  . BACK PAIN, CHRONIC 07/03/2008  . Esophageal reflux 06/12/2008  . Diarrhea 06/12/2008  . ABDOMINAL PAIN-RUQ 05/17/2008  . Diabetes ([facility]) 12/10/2006  . Dyslipidemia 12/10/2006  . Gout 12/10/2006  . HYPERTENSION 12/10/2006  . DIVERTICULOSIS, COLON 12/10/2006  . OSTEOARTHRITIS 12/10/2006  . ESOPHAGEAL STRICTURE 08/23/2002  . HIATAL HERNIA 08/23/2002  . INTERNAL HEMORRHOIDS 02/02/2001   Past Medical History:  Diagnosis Date  . ANEMIA, IRON DEFICIENCY 05/08/2009  . Angina   . ASYMPTOMATIC POSTMENOPAUSAL STATUS 10/11/2008  . Blood transfusion   . Blood transfusion without reported diagnosis   . Breast cancer ([facility]) 09/29/11   invasive grade III ductal ca,assoc high grade dcis,ER/PR=neg  . C. difficile colitis   . Diverticulosis of colon (without mention of hemorrhage)   . Esophageal reflux 06/12/2008  . Gastroparesis   . GOITER, MULTINODULAR 04/02/2009  . Gout, unspecified   . H/O hiatal hernia   . History of lower GI bleeding   . History of radiation therapy 02/08/12-03/25/12   left breast,total 61gy  . Hypokalemia 05/11/2013  . Hypomagnesemia   . HYPOTHYROIDISM, POST-RADIATION 08/13/2009  . Internal  hemorrhoids without mention of complication   . Kidney stones    "several"  . Leukopenia   . Migraines   . Obesity   . Osteoarthrosis, unspecified whether generalized or localized, unspecified site   . Other and unspecified hyperlipidemia   . Personal history of chemotherapy 2013  . Personal history of radiation therapy 2013   left  . PONV (postoperative nausea and vomiting)   . PUD (peptic ulcer disease)   . Short bowel syndrome   . Shortness of breath on exertion    "sometimes"  . Stricture and stenosis of esophagus   . Thyrotoxicosis without mention of goiter or other cause, without mention of thyrotoxic crisis or storm   . Type II or unspecified type diabetes mellitus without mention of complication, not stated as uncontrolled    no med in years diet controled  . Unspecified essential hypertension   . UTI (urinary tract infection)   . Varicose veins   . VITAMIN B12 DEFICIENCY 08/30/2009    Family History  Problem Relation Age of Onset  . Esophageal cancer Son        deceased  .  Diabetes Mother   . Heart disease Mother   . Kidney disease Sister   . Diabetes Father   . Hypertension Father   . Kidney disease Brother        x 3  . Colon cancer Paternal Uncle   . Breast cancer Maternal Aunt   . Breast cancer Paternal Aunt   . Breast cancer Cousin        Pt states she has 15+ cousins w/ Breast CA    Past Surgical History:  Procedure Laterality Date  . ABDOMINAL ADHESION SURGERY  1980's thru 1990's   "several"  . ABDOMINAL HYSTERECTOMY  1970's   with BSO  . BREAST BIOPSY Left 08/13/11   left breast lower inner quadrant  . BREAST BIOPSY Right 1985   Rt exc bx, benign  . BREAST LUMPECTOMY Left 08/2011  . BREAST LUMPECTOMY W/ NEEDLE LOCALIZATION  09/29/11   left  breast=lymph node,excision benign/ ER/PR=neg, her 2 Positive  . BUNIONECTOMY  1970's   bilateral  . CHOLECYSTECTOMY  1990's  . COLON SURGERY     "several surgeries for short bowel syndrome"  . COLONOSCOPY   2012   multiple   . DILATION AND CURETTAGE OF UTERUS    . ESOPHAGOGASTRODUODENOSCOPY  2011   multiple   . EXCISIONAL HEMORRHOIDECTOMY  11/10/2016  . EYE SURGERY     "long time ago"  . FLEXIBLE SIGMOIDOSCOPY  2011   multiple   . KIDNEY STONE SURGERY  1990's   "tried to go up & get it but pushed it further up"  . LITHOTRIPSY     "4 or 5 times"  . MASTECTOMY W/ NODES PARTIAL  09/29/11   left  . PORT-A-CATH REMOVAL Right 12/19/2013   Procedure: MINOR REMOVAL PORT-A-CATH;  Surgeon: Adin Hector, MD;  Location: [facility];  Service: General;  Laterality: Right;  . PORTACATH PLACEMENT  09/29/2011   Procedure: INSERTION PORT-A-CATH;  Surgeon: Adin Hector, MD;  Location: [facility];  Service: General;  Laterality: N/A;  . Thyroid Ultrasound  12/1994 and 12/1995  . TOTAL KNEE ARTHROPLASTY Right 06/05/2015   Procedure: RIGHT TOTAL KNEE ARTHROPLASTY;  Surgeon: Ninetta Lights, MD;  Location: [facility];  Service: Orthopedics;  Laterality: Right;  . VEIN LIGATION AND STRIPPING  1980's   Right leg   Social History   Occupational History  . Occupation: RETIRED  Tobacco Use  . Smoking status: Former Smoker    Packs/day: 1.00    Years: 10.00    Pack years: 10.00    Types: Cigarettes    Last attempt to quit: 09/22/1985    Years since quitting: 31.9  . Smokeless tobacco: Never Used  Substance and Sexual Activity  . Alcohol use: No    Comment: 09/29/11 "used to drink socially years ago"  . Drug use: No  . Sexual activity: Never    Comment: HRT x many yrs

## 2017-09-24 ENCOUNTER — Ambulatory Visit: Payer: Medicare Other

## 2017-09-27 ENCOUNTER — Telehealth: Payer: Self-pay | Admitting: Gastroenterology

## 2017-09-27 NOTE — Telephone Encounter (Signed)
Patient wants to be seen today for abdomina pain.  She is advised that I don't have any openings and that she should see her primary care for evaluation.  She will call back for any additional concerns.

## 2017-10-13 ENCOUNTER — Ambulatory Visit
Admission: RE | Admit: 2017-10-13 | Discharge: 2017-10-13 | Disposition: A | Discharge status: Home / Self Care | Payer: Medicare Other | Source: Ambulatory Visit | Attending: Adult Health | Admitting: Adult Health

## 2017-10-13 DIAGNOSIS — Z139 Encounter for screening, unspecified: Secondary | ICD-10-CM

## 2017-10-13 DIAGNOSIS — Z1231 Encounter for screening mammogram for malignant neoplasm of breast: Secondary | ICD-10-CM | POA: Diagnosis not present

## 2017-12-06 ENCOUNTER — Other Ambulatory Visit: Payer: Self-pay | Admitting: Endocrinology

## 2017-12-06 ENCOUNTER — Telehealth: Payer: Self-pay | Admitting: Gastroenterology

## 2017-12-06 MED ORDER — DIPHENOXYLATE-ATROPINE 2.5-0.025 MG PO TABS: 1.0000 | ORAL_TABLET | Freq: Four times a day (QID) | ORAL | 0 refills | Status: DC | PRN

## 2017-12-06 NOTE — Telephone Encounter (Signed)
Refill of lomotil sent in until OV with Nicoletta Ba PA on 12/22/17.  She thought that the reglan she has been taking was for diarrhea.  She will stop this and begin dicyclomine and lomotil as needed.

## 2017-12-06 NOTE — Telephone Encounter (Signed)
Left message for patient to call back  

## 2017-12-22 ENCOUNTER — Ambulatory Visit (INDEPENDENT_AMBULATORY_CARE_PROVIDER_SITE_OTHER): Payer: Medicare Other | Admitting: Physician Assistant

## 2017-12-22 ENCOUNTER — Encounter: Payer: Self-pay | Admitting: Physician Assistant

## 2017-12-22 ENCOUNTER — Encounter (INDEPENDENT_AMBULATORY_CARE_PROVIDER_SITE_OTHER): Payer: Self-pay

## 2017-12-22 ENCOUNTER — Other Ambulatory Visit: Payer: Medicare Other

## 2017-12-22 VITALS — BP 106/76 | HR 72 | Ht 64.25 in | Wt 205.4 lb

## 2017-12-22 DIAGNOSIS — K912 Postsurgical malabsorption, not elsewhere classified: Secondary | ICD-10-CM

## 2017-12-22 DIAGNOSIS — K529 Noninfective gastroenteritis and colitis, unspecified: Secondary | ICD-10-CM | POA: Diagnosis not present

## 2017-12-22 DIAGNOSIS — K6389 Other specified diseases of intestine: Secondary | ICD-10-CM

## 2017-12-22 MED ORDER — CHOLESTYRAMINE 4 G PO PACK: PACK | ORAL | 11 refills | Status: DC

## 2017-12-22 MED ORDER — DIPHENOXYLATE-ATROPINE 2.5-0.025 MG PO TABS: ORAL_TABLET | ORAL | 2 refills | Status: DC

## 2017-12-22 MED ORDER — RIFAXIMIN 550 MG PO TABS: ORAL_TABLET | ORAL | 0 refills | Status: DC

## 2017-12-22 NOTE — Progress Notes (Signed)
Subjective:    Patient ID: [name], female    DOB: 02-Feb-1946, 72 y.o.   MRN: [id number]  HPI [name] is a pleasant 72 year old African-American female known to [name].  She has been followed for many years and has history of chronic abdominal pain with multiple prior abdominal surgeries and history of previous small bowel obstructions.  She is status post TAH and cholecystectomy.  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  She is also had prior history of C. difficile, has adult onset diabetes mellitus, chronic GERD, history of gastroparesis, hypertension and breast cancer.  Last colonoscopy was done in 2012 which noted an anastomosis in the sigmoid colon otherwise negative exam.  Last EGD with grade a esophagitis and small hiatal hernia.  She did have gastric emptying scan in 2015 with 71% retention at 2 hours. She is currently on a regimen of Bentyl 10 mg p.o. 4 times daily chronically.  She was last seen in the office in August 2018 and was given a course of Xifaxan.  She felt that that definitely helped her symptoms and that she had very little diarrhea while she was on the antibiotic but symptoms recurred shortly after she stopped it.  She is now using Lomotil on a as needed basis and may take up to 3/day.  With both Lomotil and Bentyl she continues to have 4-5 bowel movements per day.  She says she typically just will not eat if she knows she has to go somewhere as she will have urgency postprandially has had rare occasions of incontinence. On occasion she will have a formed stool but most all of her stools are very loose.  Abdominal discomfort is chronic for her without any recent change.  Appetite is been fine weight has been stable.  She has not had any other new medications and no recent antibiotics.  Review of Systems Pertinent positive and negative review  of systems were noted in the above HPI section.  All other review of systems was otherwise negative.  Outpatient Encounter Medications as of 12/22/2017  Medication Sig  . allopurinol (ZYLOPRIM) 100 MG tablet Take 1 tablet (100 mg total) by mouth daily.  Marland Kitchen aMILoride (MIDAMOR) 5 MG tablet Take 1 tablet (5 mg total) by mouth 2 (two) times daily.  . busPIRone (BUSPAR) 15 MG tablet Take 1 tablet (15 mg total) by mouth 2 (two) times daily.  . calcium carbonate (TUMS - DOSED IN MG ELEMENTAL CALCIUM) 500 MG chewable tablet Chew 1 tablet by mouth as needed for indigestion or heartburn.  . Calcium Carbonate-Vitamin D (CALCIUM-VITAMIN D) 600-200 MG-UNIT CAPS Take 1 capsule by mouth daily.    . Cyanocobalamin (VITAMIN B 12 PO) Take 1 tablet by mouth 2 (two) times daily.   Marland Kitchen dicyclomine (BENTYL) 10 MG capsule Take 1 capsule (10 mg total) by mouth 4 (four) times daily. (Patient taking differently: Take 10 mg by mouth as needed. )  . diphenoxylate-atropine (LOMOTIL) 2.5-0.025 MG tablet Take 1 tablet by mouth 3 times daily as needed for diarrhea.  . ferrous sulfate 325 (65 FE) MG tablet Take 325 mg by mouth daily with breakfast.  . gabapentin (NEURONTIN) 300 MG capsule Take 2 capsules (600 mg total) by mouth 2 (two) times daily.  Marland Kitchen HYDROcodone-acetaminophen (NORCO/VICODIN) 5-325 MG tablet 1 TAB PO Q 4H PRN  . levothyroxine (SYNTHROID, LEVOTHROID) 175 MCG tablet TAKE  1 TABLET BY MOUTH  DAILY BEFORE BREAKFAST  . Lidocaine, Anorectal, 5 % GEL Apply a pea-sized amount to the affected area 3 times daily.  Marland Kitchen loperamide (IMODIUM) 2 MG capsule Take 2 capsules (4 mg total) by mouth every 4 (four) hours as needed for diarrhea or loose stools.  . lovastatin (MEVACOR) 20 MG tablet TAKE 1 TABLET BY MOUTH  DAILY AT 6 PM.  . Magnesium Chloride 64 MG TBEC Take 64 mg by mouth daily.  . metoCLOPramide (REGLAN) 5 MG tablet Take 1 tablet (5 mg total) by mouth 4 (four) times daily -  before meals and at bedtime. As needed  .  ondansetron (ZOFRAN-ODT) 4 MG disintegrating tablet TAKE 1 TABLET BY MOUTH EVERY 8 HOURS AS NEEDED FOR NAUSEA  . oxyCODONE-acetaminophen (PERCOCET/ROXICET) 5-325 MG tablet 1 TAB PO Q 4H PRN  . pantoprazole (PROTONIX) 40 MG tablet TAKE 1 TABLET BY MOUTH 30 MINUTES PRIOR TO BREAKFAST AND SUPPER  . ranitidine (ZANTAC) 300 MG tablet Take 1 tablet (300 mg total) by mouth at bedtime.  . thiamine 100 MG tablet Take 100 mg by mouth daily.    . [DISCONTINUED] diphenoxylate-atropine (LOMOTIL) 2.5-0.025 MG tablet Take 1 tablet by mouth every 6 (six) hours as needed for diarrhea or loose stools.  . [DISCONTINUED] diphenoxylate-atropine (LOMOTIL) 2.5-0.025 MG tablet Take 1 tablet by mouth 3 times daily as needed for diarrhea.  . cholestyramine (QUESTRAN) 4 g packet Take 1 packet in juice or water by mouth twice daily.  . rifaximin (XIFAXAN) 550 MG TABS tablet Take 1 tablet by mouth 3 times daily for 14 days.  . [DISCONTINUED] celecoxib (CELEBREX) 200 MG capsule Take 1 capsule (200 mg total) by mouth daily. (Patient not taking: Reported on 08/30/2017)  . [DISCONTINUED] cholestyramine (QUESTRAN) 4 g packet Take 1 packet in juice or water by mouth twice daily.  . [DISCONTINUED] cholestyramine (QUESTRAN) 4 g packet Take 1 packet in juice or water by mouth twice daily.  . [DISCONTINUED] hydrocortisone (ANUSOL-HC) 25 MG suppository Place 1 suppository (25 mg total) rectally every 12 (twelve) hours. (Patient not taking: Reported on 08/30/2017)  . [DISCONTINUED] methocarbamol (ROBAXIN) 500 MG tablet Take 1 tablet (500 mg total) by mouth 2 (two) times daily as needed for muscle spasms.  . [DISCONTINUED] pramoxine-hydrocortisone (ANALPRAM HC) cream Apply 1 application topically 2 (two) times daily as needed.  . [DISCONTINUED] saccharomyces boulardii (FLORASTOR) 250 MG capsule Take 1 capsule (250 mg total) by mouth 2 (two) times daily. (Patient not taking: Reported on 08/30/2017)  . [DISCONTINUED] traMADol (ULTRAM) 50 MG tablet  Take 0.5-1 tablets (25-50 mg total) by mouth every 6 (six) hours as needed. (Patient not taking: Reported on 09/13/2017)  . [DISCONTINUED] zolpidem (AMBIEN) 5 MG tablet TAKE 1 TABLET AT BEDTIME AS NEEDED FOR SLEEP (Patient not taking: Reported on 08/30/2017)   Facility-Administered Encounter Medications as of 12/22/2017  Medication  . acetaminophen (TYLENOL) tablet 1,000 mg   Allergies  Allergen Reactions  . Aspirin Other (See Comments)    REACTION: Gi Intolerance/ Burning in stomach  . Trazodone And Nefazodone     "sick"  . Flagyl [Metronidazole] Rash  . Iodine Itching    Allergic to IVP dye  . Morphine And Related Itching   Patient Active Problem List   Diagnosis Date Noted  . 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 UTI (urinary tract infection) 11/27/2014  . UTI (lower urinary tract infection) 11/27/2014  . Acute cystitis with hematuria 11/13/2014  . Hemorrhoids, external 11/04/2014  .  Wellness examination 10/03/2014  . Pain in joint, shoulder region 09/24/2014  . Cramp in limb 06/26/2014  . Rectal bleeding 05/02/2014  . External hemorrhoids 05/02/2014  . Hemorrhoids without complication 38/03/1750  . Pain in joint, lower leg 02/01/2014  . Vitamin D deficiency 01/16/2014  . Gastroparesis 09/14/2013  . Other dysphagia 09/04/2013  . Nausea alone 09/04/2013  . Iron deficiency anemia 08/28/2013  . Hypomagnesemia 05/11/2013  . Hypokalemia 05/11/2013  . Generalized weakness 05/11/2013  . Dehydration 11/25/2011  . Fatigue 11/16/2011  . Breast cancer ([place]) 09/29/2011  . Family history of breast cancer 08/19/2011  . Primary cancer of lower-inner quadrant of left female breast ([place]) 08/17/2011  . Neck pain 07/29/2011  . Routine general medical examination at a health care facility 06/28/2011  . Edema 11/26/2010  . CLOSTRIDIUM DIFFICILE COLITIS 02/07/2010  . Blind loop syndrome 10/01/2009  . Abdominal pain 10/01/2009  . Vitamin B 12 deficiency 08/30/2009  .  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 Rash and nonspecific skin eruption 08/13/2009  . GOITER, MULTINODULAR 04/02/2009  . CONTACT DERMATITIS&OTHER ECZEMA DUE UNSPEC CAUSE 02/20/2009  . URINARY CALCULUS 10/11/2008  . Asymptomatic menopausal state 10/11/2008  . BACK PAIN, CHRONIC 07/03/2008  . Esophageal reflux 06/12/2008  . Diarrhea 06/12/2008  . ABDOMINAL PAIN-RUQ 05/17/2008  . Diabetes ([place]) 12/10/2006  . Dyslipidemia 12/10/2006  . Gout 12/10/2006  . HYPERTENSION 12/10/2006  . DIVERTICULOSIS, COLON 12/10/2006  . OSTEOARTHRITIS 12/10/2006  . ESOPHAGEAL STRICTURE 08/23/2002  . HIATAL HERNIA 08/23/2002  . INTERNAL HEMORRHOIDS 02/02/2001   Social History   Socioeconomic History  . Marital status: Married    Spouse name: Not on file  . Number of children: 2  . Years of education: Not on file  . Highest education level: Not on file  Occupational History  . Occupation: RETIRED  Social Needs  . Financial resource strain: Not on file  . Food insecurity:    Worry: Not on file    Inability: Not on file  . Transportation needs:    Medical: Not on file    Non-medical: Not on file  Tobacco Use  . Smoking status: Former Smoker    Packs/day: 1.00    Years: 10.00    Pack years: 10.00    Types: Cigarettes    Last attempt to quit: 09/22/1985    Years since quitting: 32.2  . Smokeless tobacco: Never Used  Substance and Sexual Activity  . Alcohol use: No    Comment: 09/29/11 "used to drink socially years ago"  . Drug use: No  . Sexual activity: Never    Comment: HRT x many yrs  Lifestyle  . Physical activity:    Days per week: Not on file    Minutes per session: Not on file  . Stress: Not on file  Relationships  . Social connections:    Talks on phone: Not on file    Gets together: Not on file    Attends religious service: Not on file    Active member of club or organization: Not on file    Attends meetings of clubs or organizations: Not on file     Relationship status: Not on file  . Intimate partner violence:    Fear of current or ex partner: Not on file    Emotionally abused: Not on file    Physically abused: Not on file    Forced sexual activity: Not on file  Other Topics Concern  . Not on file  Social History Narrative  Pt gets regular exercise    Ms. Smylie's family history includes Breast cancer in her cousin, maternal aunt, and paternal aunt; Colon cancer in her paternal uncle; Diabetes in her father and mother; Esophageal cancer in her son; Heart disease in her mother; Hypertension in her father; Kidney disease in her brother and sister.      Objective:    Vitals:   12/22/17 0824  BP: 106/76  Pulse: 72    Physical Exam ; well-developed older African-American female in no acute distress, very pleasant blood pressure 106/76 pulse 72, height 5 foot 4, weight 204, BMI 34.9.  HEENT; nontraumatic normocephalic EOMI PERRLA sclera anicteric, oropharynx benign Cardiovascular; regular rate and rhythm with S1-S2 no murmur rub or gallop, Pulmonary; clear bilaterally, Abdomen; soft she has some tenderness in the left mid and left lower quadrant no guarding or rebound, multiple incisional scars no distention no palpable mass or hepatosplenomegaly bowel sounds present, Rectal; exam not done, Extremities; no clubbing cyanosis or edema skin warm and dry, Neuro psych; alert and oriented, grossly nonfocal mood and affect appropriate      Assessment & Plan:   #104 72 year old African-American female with chronic diarrhea felt secondary to component of short bowel syndrome, and small intestinal bacterial overgrowth. Patient also has history of chronic abdominal pain with multiple prior abdominal surgeries prior partial colectomy, and history of previous small bowel obstructions. 2 status post cholecystectomy 3.  Status post total abdominal hysterectomy 4.  Remote history of C. difficile colitis 5.  Chronic GERD 6.  History of  gastroparesis-currently not requiring any metoclopramide 7.  History of breast cancer 8.  Hypertension  Plan; Will give her a trial of Questran 4 g twice daily to be given 2 hours away from other medications We will also retreat with a 14-day course of Xifaxan 550 mg p.o. 3 times daily. Have refilled Bentyl 10 mg p.o. 3-4 times daily as needed. Have also refilled Lomotil 1 tablet every 6 to 8 hours as needed.  I encouraged her to use the Lomotil sparingly, and hopefully if Questran is beneficial she can stop the Lomotil. Check stool for lactoferrin and fecal elastase today to rule out any other contributing sources. Follow-up with [name] or myself in 2 months.  Collie Wernick Genia Harold PA-C 12/22/2017   Cc: Renato Shin, MD

## 2017-12-22 NOTE — Progress Notes (Signed)
Reviewed and agree with management plan.  Fernanda Twaddell T. Kale Dols, MD FACG 

## 2017-12-22 NOTE — Patient Instructions (Addendum)
If you are age 74 or older, your body mass index should be between 23-30. Your Body mass index is 34.98 kg/m. If this is out of the aforementioned range listed, please consider follow up with your Primary Care Provider.  Your provider has requested that you go to the basement level for stool studies before leaving today. Press "B" on the elevator. The lab is located at the first door on the left as you exit the elevator.  We faxed prescriptions to [facility] by mail.  1. Lomotil tablets 2. Questran packets 3. Xifaxan 550 mg  Continue Bentyl ( dicyclomine)  10 mg. We made you a follow up appointment with [name] for 02-09-2018 at 8:30 am.

## 2018-01-03 ENCOUNTER — Other Ambulatory Visit: Payer: Self-pay | Admitting: Physician Assistant

## 2018-01-24 ENCOUNTER — Other Ambulatory Visit: Payer: Self-pay

## 2018-01-24 ENCOUNTER — Telehealth: Payer: Self-pay | Admitting: Endocrinology

## 2018-01-24 MED ORDER — RANITIDINE HCL 300 MG PO TABS: 300.0000 mg | ORAL_TABLET | Freq: Every day | ORAL | 2 refills | Status: DC

## 2018-01-24 NOTE — Telephone Encounter (Signed)
Prescription ordered and LVM letting patient know it has been done

## 2018-01-24 NOTE — Telephone Encounter (Signed)
ranitidine (ZANTAC) 300 MG tablet    Patient stated she is needing prescription sent into the pharmacy, she would like a call once this has been sent.   [facility] - [place], [place] - [address]

## 2018-02-09 ENCOUNTER — Encounter: Payer: Self-pay | Admitting: Gastroenterology

## 2018-02-09 ENCOUNTER — Encounter (INDEPENDENT_AMBULATORY_CARE_PROVIDER_SITE_OTHER): Payer: Self-pay

## 2018-02-09 ENCOUNTER — Ambulatory Visit (INDEPENDENT_AMBULATORY_CARE_PROVIDER_SITE_OTHER): Payer: Medicare Other | Admitting: Gastroenterology

## 2018-02-09 VITALS — BP 116/78 | HR 70 | Ht 64.25 in | Wt 208.2 lb

## 2018-02-09 DIAGNOSIS — R197 Diarrhea, unspecified: Secondary | ICD-10-CM | POA: Diagnosis not present

## 2018-02-09 DIAGNOSIS — R1030 Lower abdominal pain, unspecified: Secondary | ICD-10-CM | POA: Diagnosis not present

## 2018-02-09 NOTE — Progress Notes (Signed)
    History of Present Illness: This is a 72 year old female with chronic diarrhea and chronic abdominal pain.  She was evaluated on July 24 by Nicoletta Ba.  Diarrhea has previously respond to courses of Xifaxan consistent with SIBO.  Fecal lactoferrin and pancreatic elastase ordered in July have not been completed.  A repeat course of Xifaxan was prescribed along with Questran and her diarrhea has improved.  She continues to have about 4-5 loose stools per day occasionally at night.  Stools are occasionally following a meal.  She is uncertain if metoclopramide provides any benefit.  She has had mild lower abdominal discomfort that has not responded to dicyclomine.  Current Medications, Allergies, Past Medical History, Past Surgical History, Family History and Social History were reviewed in [employer] record.  Physical Exam: General: Well developed, well nourished, no acute distress Head: Normocephalic and atraumatic Eyes:  sclerae anicteric, EOMI Ears: Normal auditory acuity Mouth: No deformity or lesions Lungs: Clear throughout to auscultation Heart: Regular rate and rhythm; no murmurs, rubs or bruits Abdomen: Soft, mild lower abdominal tenderness, right rater than left, and non distended. No masses, hepatosplenomegaly or hernias noted. Normal Bowel sounds Rectal: Not done Musculoskeletal: Symmetrical with no gross deformities  Pulses:  Normal pulses noted Extremities: No clubbing, cyanosis, edema or deformities noted Neurological: Alert oriented x 4, grossly nonfocal Psychological:  Alert and cooperative. Normal mood and affect  Assessment and Recommendations:  1.  Chronic diarrhea.  Suspected SIBO. Possible bile salt diarrhea. Improved post Xifaxan and with daily Questran. Increase Questran to bid and if not effective will increase to TID. Continue Lomotil.  If this regimen is not effective will encourage her to complete fecal lactoferrin and fecal elastase.   REV in 3 months.   2.  Chronic abdominal pain with multiple prior abdominal surgeries and history of small bowel obstructions. Continue dicyclomine 10 mg po qid. If symptoms not improved increase to 20 mg 4 times daily and consider further evaluation.  3.  Gastroparesis. Continue gastroparesis diet. DC metoclopramide.   4.  GERD. Continue pantoprazole 40 mg po qd, ranitidine 300 mg po hs.

## 2018-02-09 NOTE — Patient Instructions (Signed)
Stop taking metoclopramide.   Increase your Questran to twice daily.   Call back in 2 weeks if no improvement in your symptoms.   Thank you for choosing me and [facility].  Pricilla Riffle. Dagoberto Ligas., MD., Mar[facility] Regal

## 2018-02-10 DIAGNOSIS — E119 Type 2 diabetes mellitus without complications: Secondary | ICD-10-CM | POA: Diagnosis not present

## 2018-02-10 LAB — HM DIABETES EYE EXAM

## 2018-02-16 ENCOUNTER — Encounter: Payer: Self-pay | Admitting: Endocrinology

## 2018-02-16 ENCOUNTER — Ambulatory Visit (INDEPENDENT_AMBULATORY_CARE_PROVIDER_SITE_OTHER): Payer: Medicare Other | Admitting: Endocrinology

## 2018-02-16 VITALS — BP 114/68 | HR 66 | Ht 64.0 in | Wt 209.2 lb

## 2018-02-16 DIAGNOSIS — R609 Edema, unspecified: Secondary | ICD-10-CM

## 2018-02-16 DIAGNOSIS — R0609 Other forms of dyspnea: Secondary | ICD-10-CM | POA: Insufficient documentation

## 2018-02-16 DIAGNOSIS — E890 Postprocedural hypothyroidism: Secondary | ICD-10-CM

## 2018-02-16 DIAGNOSIS — R0600 Dyspnea, unspecified: Secondary | ICD-10-CM

## 2018-02-16 DIAGNOSIS — E876 Hypokalemia: Secondary | ICD-10-CM | POA: Diagnosis not present

## 2018-02-16 LAB — BASIC METABOLIC PANEL
BUN: 21 mg/dL (ref 6–23)
CALCIUM: 9.5 mg/dL (ref 8.4–10.5)
CO2: 29 mEq/L (ref 19–32)
CREATININE: 1.06 mg/dL (ref 0.40–1.20)
Chloride: 102 mEq/L (ref 96–112)
GFR: 65.19 mL/min (ref >60.00)
GLUCOSE: 88 mg/dL (ref 70–99)
Potassium: 4.3 mEq/L (ref 3.5–5.1)
SODIUM: 137 meq/L (ref 135–145)

## 2018-02-16 LAB — BRAIN NATRIURETIC PEPTIDE: Pro B Natriuretic peptide (BNP): 60 pg/mL (ref 0.0–100.0)

## 2018-02-16 LAB — TSH: TSH: 28.21 u[IU]/mL — ABNORMAL HIGH (ref 0.35–4.50)

## 2018-02-16 MED ORDER — LEVOTHYROXINE SODIUM 200 MCG PO TABS: 200.0000 ug | ORAL_TABLET | Freq: Every day | ORAL | 3 refills | Status: DC

## 2018-02-16 NOTE — Progress Notes (Signed)
Subjective:    Patient ID: [name], female    DOB: 07/01/1945, 72 y.o.   MRN: [id number]  HPI 1 week of slight worsening of chronic swelling at both legs, but no assoc pain.   Past Medical History:  Diagnosis Date  . ANEMIA, IRON DEFICIENCY 05/08/2009  . Angina   . ASYMPTOMATIC POSTMENOPAUSAL STATUS 10/11/2008  . Blood transfusion   . Blood transfusion without reported diagnosis   . Breast cancer ([place]) 09/29/11   invasive grade III ductal ca,assoc high grade dcis,ER/PR=neg  . C. difficile colitis   . Diverticulosis of colon (without mention of hemorrhage)   . Esophageal reflux 06/12/2008  . Gastroparesis   . GOITER, MULTINODULAR 04/02/2009  . Gout, unspecified   . H/O hiatal hernia   . History of lower GI bleeding   . History of radiation therapy 02/08/12-03/25/12   left breast,total 61gy  . Hypokalemia 05/11/2013  . Hypomagnesemia   . HYPOTHYROIDISM, POST-RADIATION 08/13/2009  . Internal hemorrhoids without mention of complication   . Kidney stones    "several"  . Leukopenia   . Migraines   . Obesity   . Osteoarthrosis, unspecified whether generalized or localized, unspecified site   . Other and unspecified hyperlipidemia   . Personal history of chemotherapy 2013  . Personal history of radiation therapy 2013   left  . PONV (postoperative nausea and vomiting)   . PUD (peptic ulcer disease)   . Short bowel syndrome   . Shortness of breath on exertion    "sometimes"  . Stricture and stenosis of esophagus   . Thyrotoxicosis without mention of goiter or other cause, without mention of thyrotoxic crisis or storm   . Type II or unspecified type diabetes mellitus without mention of complication, not stated as uncontrolled    no med in years diet controled  . Unspecified essential hypertension   . UTI (urinary tract infection)   . Varicose veins   . VITAMIN B12 DEFICIENCY 08/30/2009    Past Surgical History:  Procedure Laterality Date  . ABDOMINAL ADHESION SURGERY   1980's thru 1990's   "several"  . ABDOMINAL HYSTERECTOMY  1970's   with BSO  . BREAST BIOPSY Left 08/13/11   left breast lower inner quadrant  . BREAST BIOPSY Right 1985   Rt exc bx, benign  . BREAST LUMPECTOMY Left 08/2011  . BREAST LUMPECTOMY W/ NEEDLE LOCALIZATION  09/29/11   left  breast=lymph node,excision benign/ ER/PR=neg, her 2 Positive  . BUNIONECTOMY  1970's   bilateral  . CHOLECYSTECTOMY  1990's  . COLON SURGERY     "several surgeries for short bowel syndrome"  . COLONOSCOPY  2012   multiple   . DILATION AND CURETTAGE OF UTERUS    . ESOPHAGOGASTRODUODENOSCOPY  2011   multiple   . EXCISIONAL HEMORRHOIDECTOMY  11/10/2016  . EYE SURGERY     "long time ago"  . FLEXIBLE SIGMOIDOSCOPY  2011   multiple   . KIDNEY STONE SURGERY  1990's   "tried to go up & get it but pushed it further up"  . LITHOTRIPSY     "4 or 5 times"  . MASTECTOMY W/ NODES PARTIAL  09/29/11   left  . PORT-A-CATH REMOVAL Right 12/19/2013   Procedure: MINOR REMOVAL PORT-A-CATH;  Surgeon: Adin Hector, MD;  Location: [facility];  Service: General;  Laterality: Right;  . PORTACATH PLACEMENT  09/29/2011   Procedure: INSERTION PORT-A-CATH;  Surgeon: Adin Hector, MD;  Location: [place];  Service:  General;  Laterality: N/A;  . Thyroid Ultrasound  12/1994 and 12/1995  . TOTAL KNEE ARTHROPLASTY Right 06/05/2015   Procedure: RIGHT TOTAL KNEE ARTHROPLASTY;  Surgeon: Ninetta Lights, MD;  Location: [place];  Service: Orthopedics;  Laterality: Right;  . VEIN LIGATION AND STRIPPING  1980's   Right leg    Social History   Socioeconomic History  . Marital status: Married    Spouse name: Not on file  . Number of children: 2  . Years of education: Not on file  . Highest education level: Not on file  Occupational History  . Occupation: RETIRED  Social Needs  . Financial resource strain: Not on file  . Food insecurity:    Worry: Not on file    Inability: Not on file  . Transportation needs:     Medical: Not on file    Non-medical: Not on file  Tobacco Use  . Smoking status: Former Smoker    Packs/day: 1.00    Years: 10.00    Pack years: 10.00    Types: Cigarettes    Last attempt to quit: 09/22/1985    Years since quitting: 32.4  . Smokeless tobacco: Never Used  Substance and Sexual Activity  . Alcohol use: No    Comment: 09/29/11 "used to drink socially years ago"  . Drug use: No  . Sexual activity: Never    Comment: HRT x many yrs  Lifestyle  . Physical activity:    Days per week: Not on file    Minutes per session: Not on file  . Stress: Not on file  Relationships  . Social connections:    Talks on phone: Not on file    Gets together: Not on file    Attends religious service: Not on file    Active member of club or organization: Not on file    Attends meetings of clubs or organizations: Not on file    Relationship status: Not on file  . Intimate partner violence:    Fear of current or ex partner: Not on file    Emotionally abused: Not on file    Physically abused: Not on file    Forced sexual activity: Not on file  Other Topics Concern  . Not on file  Social History Narrative   Pt gets regular exercise    Current Outpatient Medications on File Prior to Visit  Medication Sig Dispense Refill  . allopurinol (ZYLOPRIM) 100 MG tablet Take 1 tablet (100 mg total) by mouth daily. 90 tablet 3  . aMILoride (MIDAMOR) 5 MG tablet Take 1 tablet (5 mg total) by mouth 2 (two) times daily. 90 tablet 6  . busPIRone (BUSPAR) 15 MG tablet Take 1 tablet (15 mg total) by mouth 2 (two) times daily. 30 tablet 11  . calcium carbonate (TUMS - DOSED IN MG ELEMENTAL CALCIUM) 500 MG chewable tablet Chew 1 tablet by mouth as needed for indigestion or heartburn.    . Calcium Carbonate-Vitamin D (CALCIUM-VITAMIN D) 600-200 MG-UNIT CAPS Take 1 capsule by mouth daily.      . cholestyramine (QUESTRAN) 4 g packet Take 1 packet in juice or water by mouth twice daily. 180 each 1  .  Cyanocobalamin (VITAMIN B 12 PO) Take 1 tablet by mouth 2 (two) times daily.     Marland Kitchen dicyclomine (BENTYL) 10 MG capsule TAKE 1 CAPSULE (10 MG TOTAL) BY MOUTH 4 (FOUR) TIMES DAILY. 120 capsule 6  . diphenoxylate-atropine (LOMOTIL) 2.5-0.025 MG tablet Take 1 tablet by mouth  3 times daily as needed for diarrhea. 90 tablet 2  . gabapentin (NEURONTIN) 300 MG capsule Take 2 capsules (600 mg total) by mouth 2 (two) times daily. 120 capsule 11  . Lidocaine, Anorectal, 5 % GEL Apply a pea-sized amount to the affected area 3 times daily. 30 g 0  . lovastatin (MEVACOR) 20 MG tablet TAKE 1 TABLET BY MOUTH  DAILY AT 6 PM. 90 tablet 2  . Magnesium Chloride 64 MG TBEC Take 64 mg by mouth daily.    . ondansetron (ZOFRAN-ODT) 4 MG disintegrating tablet TAKE 1 TABLET BY MOUTH EVERY 8 HOURS AS NEEDED FOR NAUSEA 30 tablet 2  . pantoprazole (PROTONIX) 40 MG tablet TAKE 1 TABLET BY MOUTH 30 MINUTES PRIOR TO BREAKFAST AND SUPPER 60 tablet 11  . ranitidine (ZANTAC) 300 MG tablet Take 1 tablet (300 mg total) by mouth at bedtime. 90 tablet 2  . rifaximin (XIFAXAN) 550 MG TABS tablet Take 1 tablet by mouth 3 times daily for 14 days. (Patient taking differently: continuous as needed. Take 1 tablet by mouth 3 times daily for 14 days.) 42 tablet 0   Current Facility-Administered Medications on File Prior to Visit  Medication Dose Route Frequency Provider Last Rate Last Dose  . acetaminophen (TYLENOL) tablet 1,000 mg  1,000 mg Oral Once Amada Kingfisher, MD        Allergies  Allergen Reactions  . Aspirin Other (See Comments)    REACTION: Gi Intolerance/ Burning in stomach  . Iodinated Diagnostic Agents Itching  . Trazodone And Nefazodone     "sick"  . Flagyl [Metronidazole] Rash  . Iodine Itching    Allergic to IVP dye  . Morphine And Related Itching    Family History  Problem Relation Age of Onset  . Esophageal cancer Son        deceased  . Diabetes Mother   . Heart disease Mother   . Kidney disease Sister   .  Diabetes Father   . Hypertension Father   . Kidney disease Brother        x 3  . Colon cancer Paternal Uncle   . Breast cancer Maternal Aunt   . Breast cancer Paternal Aunt   . Breast cancer Cousin        Pt states she has 15+ cousins w/ Breast CA    BP 114/68 (BP Location: Right Arm)   Pulse 66   Ht 5\' 4"  (1.626 m)   Wt 209 lb 3.2 oz (94.9 kg)   SpO2 98%   BMI 35.91 kg/m    Review of Systems Denies itching.  She has slight doe.      Objective:   Physical Exam VITAL SIGNS:  See vs page GENERAL: no distress LUNGS:  Clear to auscultation Ext: trace bilat leg edema.     Lab Results  Component Value Date   TSH 2.37 06/28/2017   T4TOTAL 8.1 07/29/2007   Lab Results  Component Value Date   CREATININE 1.08 08/18/2017   BUN 18 08/18/2017   NA 142 08/18/2017   K 4.1 08/18/2017   CL 109 08/18/2017   CO2 26 08/18/2017   Lab Results  Component Value Date   TSH 28.21 (H) 02/16/2018   T4TOTAL 8.1 07/29/2007      Assessment & Plan:  Edema, persistent: uncertain etiology Hypothyroidism, worse: she needs increased rx.  I have sent a prescription to your pharmacy.   Patient Instructions  Let's recheck the echhocardiogram.  you will receive a phone call,  about a day and time for an appointment. blood tests are requested for you today.  We'll let you know about the results.

## 2018-02-16 NOTE — Patient Instructions (Signed)
Let's recheck the echhocardiogram.  you will receive a phone call, about a day and time for an appointment. blood tests are requested for you today.  We'll let you know about the results.

## 2018-02-17 LAB — D-DIMER, QUANTITATIVE (NOT AT ARMC): D DIMER QUANT: 0.95 ug{FEU}/mL — AB (ref <0.50)

## 2018-02-18 ENCOUNTER — Ambulatory Visit (HOSPITAL_COMMUNITY)
Admission: RE | Admit: 2018-02-18 | Discharge: 2018-02-18 | Disposition: A | Discharge status: Home / Self Care | Payer: Medicare Other | Source: Ambulatory Visit | Attending: Vascular Surgery | Admitting: Vascular Surgery

## 2018-02-18 DIAGNOSIS — I872 Venous insufficiency (chronic) (peripheral): Secondary | ICD-10-CM | POA: Diagnosis not present

## 2018-02-18 DIAGNOSIS — R609 Edema, unspecified: Secondary | ICD-10-CM | POA: Diagnosis not present

## 2018-02-24 ENCOUNTER — Other Ambulatory Visit: Payer: Self-pay | Admitting: Endocrinology

## 2018-02-24 ENCOUNTER — Ambulatory Visit (HOSPITAL_COMMUNITY)
Admission: RE | Admit: 2018-02-24 | Discharge: 2018-02-24 | Disposition: A | Discharge status: Home / Self Care | Payer: Medicare Other | Source: Ambulatory Visit | Attending: Endocrinology | Admitting: Endocrinology

## 2018-02-24 DIAGNOSIS — R0600 Dyspnea, unspecified: Secondary | ICD-10-CM | POA: Diagnosis not present

## 2018-02-24 DIAGNOSIS — Z853 Personal history of malignant neoplasm of breast: Secondary | ICD-10-CM | POA: Diagnosis not present

## 2018-02-24 DIAGNOSIS — I10 Essential (primary) hypertension: Secondary | ICD-10-CM | POA: Diagnosis not present

## 2018-02-24 DIAGNOSIS — R609 Edema, unspecified: Secondary | ICD-10-CM | POA: Diagnosis not present

## 2018-02-24 DIAGNOSIS — E785 Hyperlipidemia, unspecified: Secondary | ICD-10-CM | POA: Diagnosis not present

## 2018-02-24 NOTE — Progress Notes (Signed)
  Echocardiogram 2D Echocardiogram has been performed.  Tifanie Gardiner L Androw 02/24/2018, 11:40 AM

## 2018-02-25 ENCOUNTER — Telehealth: Payer: Self-pay | Admitting: Endocrinology

## 2018-02-25 NOTE — Telephone Encounter (Signed)
Spoke to pt an results given for echo awaiting doctor to review doppler

## 2018-02-25 NOTE — Telephone Encounter (Signed)
Please call patient at ph# [phone] to give her the results of the echo cardiogram and Doppler study of veins in legs. If no answer please call cell# [phone]

## 2018-03-09 ENCOUNTER — Telehealth: Payer: Self-pay | Admitting: Endocrinology

## 2018-03-09 NOTE — Telephone Encounter (Signed)
Pt was told by Dr.Ellison he would be referring her to a heart and vascular dr. She has not heard anything. Please f/u

## 2018-03-14 NOTE — Telephone Encounter (Signed)
Called pt to inform her that it looks like the referral has been authorized and just needs to be scheduled so she can contact vein and vascular to schedule. Info lft on vm to contact office with any other questions

## 2018-03-15 ENCOUNTER — Ambulatory Visit (INDEPENDENT_AMBULATORY_CARE_PROVIDER_SITE_OTHER): Payer: Medicare Other

## 2018-03-15 ENCOUNTER — Telehealth: Payer: Self-pay | Admitting: Endocrinology

## 2018-03-15 DIAGNOSIS — N182 Chronic kidney disease, stage 2 (mild): Secondary | ICD-10-CM | POA: Diagnosis not present

## 2018-03-15 DIAGNOSIS — K912 Postsurgical malabsorption, not elsewhere classified: Secondary | ICD-10-CM | POA: Diagnosis not present

## 2018-03-15 DIAGNOSIS — Z23 Encounter for immunization: Secondary | ICD-10-CM

## 2018-03-15 NOTE — Telephone Encounter (Signed)
Patient came in today requesting a CMN or LMN for bra because she had a lumpectomy done and needs to be fitted. This has to come from patient's PCP. Can this be done because it will be a while before the patient sees another provider.This will need to be faxed to: [phone].

## 2018-03-15 NOTE — Telephone Encounter (Signed)
Please call the patient once this has been done. Thanks

## 2018-03-16 ENCOUNTER — Telehealth: Payer: Self-pay | Admitting: Endocrinology

## 2018-03-16 ENCOUNTER — Other Ambulatory Visit: Payer: Self-pay

## 2018-03-16 MED ORDER — LOVASTATIN 20 MG PO TABS: ORAL_TABLET | ORAL | 2 refills | Status: DC

## 2018-03-16 MED ORDER — PANTOPRAZOLE SODIUM 40 MG PO TBEC: DELAYED_RELEASE_TABLET | ORAL | 11 refills | Status: DC

## 2018-03-16 NOTE — Telephone Encounter (Signed)
[facility] Caller states she is needing rx renewed and request sent to the [facility] number [phone]

## 2018-03-16 NOTE — Telephone Encounter (Signed)
Patient is stating the RX that was refilled needs to be sent to the [facility] not [facility]. Please Advise. [facility] - [place], [place]

## 2018-03-16 NOTE — Progress Notes (Signed)
Per Dr. Cordelia Pen instructions, pt was given High Dose Flu Vaccine by A. Aedin Jeansonne, LPN. Pt tolerated well.

## 2018-03-16 NOTE — Telephone Encounter (Signed)
sent 

## 2018-03-16 NOTE — Telephone Encounter (Signed)
Please refill both x 3 months

## 2018-03-16 NOTE — Telephone Encounter (Signed)
I did letter.  I see pt has not made appt with new PCP.  Please ask pt to call and make appt soon

## 2018-03-17 ENCOUNTER — Encounter: Payer: Self-pay | Admitting: Endocrinology

## 2018-03-17 ENCOUNTER — Other Ambulatory Visit: Payer: Self-pay

## 2018-03-17 ENCOUNTER — Ambulatory Visit (INDEPENDENT_AMBULATORY_CARE_PROVIDER_SITE_OTHER): Payer: Medicare Other | Admitting: Endocrinology

## 2018-03-17 VITALS — BP 122/70 | HR 63 | Ht 64.0 in | Wt 203.2 lb

## 2018-03-17 DIAGNOSIS — Z0000 Encounter for general adult medical examination without abnormal findings: Secondary | ICD-10-CM

## 2018-03-17 DIAGNOSIS — I10 Essential (primary) hypertension: Secondary | ICD-10-CM | POA: Diagnosis not present

## 2018-03-17 DIAGNOSIS — E890 Postprocedural hypothyroidism: Secondary | ICD-10-CM

## 2018-03-17 DIAGNOSIS — E119 Type 2 diabetes mellitus without complications: Secondary | ICD-10-CM

## 2018-03-17 DIAGNOSIS — E785 Hyperlipidemia, unspecified: Secondary | ICD-10-CM

## 2018-03-17 DIAGNOSIS — Z780 Asymptomatic menopausal state: Secondary | ICD-10-CM | POA: Diagnosis not present

## 2018-03-17 DIAGNOSIS — E559 Vitamin D deficiency, unspecified: Secondary | ICD-10-CM

## 2018-03-17 LAB — POCT GLYCOSYLATED HEMOGLOBIN (HGB A1C): HEMOGLOBIN A1C: 5.5 % (ref 4.0–5.6)

## 2018-03-17 LAB — LIPID PANEL
CHOLESTEROL: 150 mg/dL (ref 0–200)
HDL: 75.5 mg/dL (ref >39.00)
LDL CALC: 52 mg/dL (ref 0–99)
NonHDL: 74.13
TRIGLYCERIDES: 112 mg/dL (ref 0.0–149.0)
Total CHOL/HDL Ratio: 2
VLDL: 22.4 mg/dL (ref 0.0–40.0)

## 2018-03-17 LAB — TSH: TSH: 0.73 u[IU]/mL (ref 0.35–4.50)

## 2018-03-17 MED ORDER — LOVASTATIN 20 MG PO TABS: ORAL_TABLET | ORAL | 2 refills | Status: DC

## 2018-03-17 MED ORDER — PANTOPRAZOLE SODIUM 40 MG PO TBEC: DELAYED_RELEASE_TABLET | ORAL | 11 refills | Status: DC

## 2018-03-17 NOTE — Progress Notes (Signed)
we discussed code status.  pt requests full code, but would not want to be started or maintained on artificial life-support measures if there was not a reasonable chance of recovery 

## 2018-03-17 NOTE — Telephone Encounter (Signed)
sent 

## 2018-03-17 NOTE — Patient Instructions (Addendum)
blood tests are requested for you today.  We'll let you know about the results. Please consider these measures for your health:  minimize alcohol.  Do not use tobacco products.  Have a colonoscopy at least every 10 years from age 74.  Women should have an annual mammogram from age 76.  Keep firearms safely stored.  Always use seat belts.  have working smoke alarms in your home.  See an eye doctor and dentist regularly.  Never drive under the influence of alcohol or drugs (including prescription drugs).   It is critically important to prevent falling down (keep floor areas well-lit, dry, and free of loose objects.  If you have a cane, walker, or wheelchair, you should use it, even for short trips around the house.  Wear flat-soled shoes.  Also, try not to rush) [place] with your new primary care provider.

## 2018-03-17 NOTE — Progress Notes (Signed)
Subjective:    Patient ID: [name], female    DOB: September 19, 1945, 72 y.o.   MRN: [id number]  HPI Pt reports 3 weeks of intermittent slight pain at the chest.  It lasts 10-15 minutes at a time.  Not related to exertion.  No assoc sob Past Medical History:  Diagnosis Date  . ANEMIA, IRON DEFICIENCY 05/08/2009  . Angina   . ASYMPTOMATIC POSTMENOPAUSAL STATUS 10/11/2008  . Blood transfusion   . Blood transfusion without reported diagnosis   . Breast cancer ([place]) 09/29/11   invasive grade III ductal ca,assoc high grade dcis,ER/PR=neg  . C. difficile colitis   . Diverticulosis of colon (without mention of hemorrhage)   . Esophageal reflux 06/12/2008  . Gastroparesis   . GOITER, MULTINODULAR 04/02/2009  . Gout, unspecified   . H/O hiatal hernia   . History of lower GI bleeding   . History of radiation therapy 02/08/12-03/25/12   left breast,total 61gy  . Hypokalemia 05/11/2013  . Hypomagnesemia   . HYPOTHYROIDISM, POST-RADIATION 08/13/2009  . Internal hemorrhoids without mention of complication   . Kidney stones    "several"  . Leukopenia   . Migraines   . Obesity   . Osteoarthrosis, unspecified whether generalized or localized, unspecified site   . Other and unspecified hyperlipidemia   . Personal history of chemotherapy 2013  . Personal history of radiation therapy 2013   left  . PONV (postoperative nausea and vomiting)   . PUD (peptic ulcer disease)   . Short bowel syndrome   . Shortness of breath on exertion    "sometimes"  . Stricture and stenosis of esophagus   . Thyrotoxicosis without mention of goiter or other cause, without mention of thyrotoxic crisis or storm   . Type II or unspecified type diabetes mellitus without mention of complication, not stated as uncontrolled    no med in years diet controled  . Unspecified essential hypertension   . UTI (urinary tract infection)   . Varicose veins   . VITAMIN B12 DEFICIENCY 08/30/2009    Past Surgical History:    Procedure Laterality Date  . ABDOMINAL ADHESION SURGERY  1980's thru 1990's   "several"  . ABDOMINAL HYSTERECTOMY  1970's   with BSO  . BREAST BIOPSY Left 08/13/11   left breast lower inner quadrant  . BREAST BIOPSY Right 1985   Rt exc bx, benign  . BREAST LUMPECTOMY Left 08/2011  . BREAST LUMPECTOMY W/ NEEDLE LOCALIZATION  09/29/11   left  breast=lymph node,excision benign/ ER/PR=neg, her 2 Positive  . BUNIONECTOMY  1970's   bilateral  . CHOLECYSTECTOMY  1990's  . COLON SURGERY     "several surgeries for short bowel syndrome"  . COLONOSCOPY  2012   multiple   . DILATION AND CURETTAGE OF UTERUS    . ESOPHAGOGASTRODUODENOSCOPY  2011   multiple   . EXCISIONAL HEMORRHOIDECTOMY  11/10/2016  . EYE SURGERY     "long time ago"  . FLEXIBLE SIGMOIDOSCOPY  2011   multiple   . KIDNEY STONE SURGERY  1990's   "tried to go up & get it but pushed it further up"  . LITHOTRIPSY     "4 or 5 times"  . MASTECTOMY W/ NODES PARTIAL  09/29/11   left  . PORT-A-CATH REMOVAL Right 12/19/2013   Procedure: MINOR REMOVAL PORT-A-CATH;  Surgeon: Adin Hector, MD;  Location: [facility];  Service: General;  Laterality: Right;  . PORTACATH PLACEMENT  09/29/2011   Procedure: INSERTION PORT-A-CATH;  Surgeon: Adin Hector, MD;  Location: [facility];  Service: General;  Laterality: N/A;  . Thyroid Ultrasound  12/1994 and 12/1995  . TOTAL KNEE ARTHROPLASTY Right 06/05/2015   Procedure: RIGHT TOTAL KNEE ARTHROPLASTY;  Surgeon: Ninetta Lights, MD;  Location: [facility];  Service: Orthopedics;  Laterality: Right;  . VEIN LIGATION AND STRIPPING  1980's   Right leg    Social History   Socioeconomic History  . Marital status: Widowed    Spouse name: Not on file  . Number of children: 2  . Years of education: Not on file  . Highest education level: Not on file  Occupational History  . Occupation: RETIRED  Social Needs  . Financial resource strain: Not on file  . Food insecurity:    Worry: Not on  file    Inability: Not on file  . Transportation needs:    Medical: Not on file    Non-medical: Not on file  Tobacco Use  . Smoking status: Former Smoker    Packs/day: 1.00    Years: 10.00    Pack years: 10.00    Types: Cigarettes    Last attempt to quit: 09/22/1985    Years since quitting: 32.5  . Smokeless tobacco: Never Used  Substance and Sexual Activity  . Alcohol use: No    Comment: 09/29/11 "used to drink socially years ago"  . Drug use: No  . Sexual activity: Never    Comment: HRT x many yrs  Lifestyle  . Physical activity:    Days per week: Not on file    Minutes per session: Not on file  . Stress: Not on file  Relationships  . Social connections:    Talks on phone: Not on file    Gets together: Not on file    Attends religious service: Not on file    Active member of club or organization: Not on file    Attends meetings of clubs or organizations: Not on file    Relationship status: Not on file  . Intimate partner violence:    Fear of current or ex partner: Not on file    Emotionally abused: Not on file    Physically abused: Not on file    Forced sexual activity: Not on file  Other Topics Concern  . Not on file  Social History Narrative   Pt gets regular exercise    Current Outpatient Medications on File Prior to Visit  Medication Sig Dispense Refill  . aMILoride (MIDAMOR) 5 MG tablet Take 1 tablet (5 mg total) by mouth 2 (two) times daily. 90 tablet 6  . calcium carbonate (TUMS - DOSED IN MG ELEMENTAL CALCIUM) 500 MG chewable tablet Chew 1 tablet by mouth as needed for indigestion or heartburn.    . Calcium Carbonate-Vitamin D (CALCIUM-VITAMIN D) 600-200 MG-UNIT CAPS Take 1 capsule by mouth daily.      . cholestyramine (QUESTRAN) 4 g packet Take 1 packet in juice or water by mouth twice daily. 180 each 1  . Cyanocobalamin (VITAMIN B 12 PO) Take 1 tablet by mouth 2 (two) times daily.     Marland Kitchen dicyclomine (BENTYL) 10 MG capsule TAKE 1 CAPSULE (10 MG TOTAL) BY  MOUTH 4 (FOUR) TIMES DAILY. 120 capsule 6  . diphenoxylate-atropine (LOMOTIL) 2.5-0.025 MG tablet Take 1 tablet by mouth 3 times daily as needed for diarrhea. 90 tablet 2  . levothyroxine (SYNTHROID, LEVOTHROID) 200 MCG tablet Take 1 tablet (200 mcg total) by mouth daily before breakfast. 90  tablet 3  . Lidocaine, Anorectal, 5 % GEL Apply a pea-sized amount to the affected area 3 times daily. 30 g 0  . Magnesium Chloride 64 MG TBEC Take 64 mg by mouth daily.    . ranitidine (ZANTAC) 300 MG tablet Take 1 tablet (300 mg total) by mouth at bedtime. 90 tablet 2  . rifaximin (XIFAXAN) 550 MG TABS tablet Take 1 tablet by mouth 3 times daily for 14 days. (Patient taking differently: continuous as needed. Take 1 tablet by mouth 3 times daily for 14 days.) 42 tablet 0   Current Facility-Administered Medications on File Prior to Visit  Medication Dose Route Frequency Provider Last Rate Last Dose  . acetaminophen (TYLENOL) tablet 1,000 mg  1,000 mg Oral Once Amada Kingfisher, MD        Allergies  Allergen Reactions  . Aspirin Other (See Comments)    REACTION: Gi Intolerance/ Burning in stomach  . Iodinated Diagnostic Agents Itching  . Trazodone And Nefazodone     "sick"  . Flagyl [Metronidazole] Rash  . Iodine Itching    Allergic to IVP dye  . Morphine And Related Itching    Family History  Problem Relation Age of Onset  . Esophageal cancer Son        deceased  . Diabetes Mother   . Heart disease Mother   . Kidney disease Sister   . Diabetes Father   . Hypertension Father   . Kidney disease Brother        x 3  . Colon cancer Paternal Uncle   . Breast cancer Maternal Aunt   . Breast cancer Paternal Aunt   . Breast cancer Cousin        Pt states she has 15+ cousins w/ Breast CA    BP 122/70 (BP Location: Right Arm, Patient Position: Sitting, Cuff Size: Normal)   Pulse 63   Ht 5\' 4"  (1.626 m)   Wt 203 lb 3.2 oz (92.2 kg)   SpO2 97%   BMI 34.88 kg/m    Review of Systems Chronic  diarrhea is less recently.  Denies LOC.      Objective:   Physical Exam VITAL SIGNS:  See vs page GENERAL: no distress LUNGS:  Clear to auscultation HEART:  Regular rate and rhythm without murmurs noted. Normal S1,S2.     Lab Results  Component Value Date   HGBA1C 5.5 03/17/2018   I personally reviewed electrocardiogram tracing (today): Indication: chest pain Impression: NSR.  No MI.  No hypertrophy. Compared to 2017: no significant change     Assessment & Plan:  Chest pain, new, uncertain etiology.  she declines treadmill.  Has appt to see cardiol soon. Type 2 DM: well-controlled off medication.  No medication is needed now.   Subjective:   Patient here for Medicare annual wellness visit and management of other chronic and acute problems.     Risk factors: advanced age    54 of Physicians Providing Medical Care to Patient:  See "snapshot"   Activities of Daily Living: In your present state of health, do you have any difficulty performing the following activities (lives alone)?:  Preparing food and eating?: No  Bathing yourself: No  Getting dressed: No  Using the toilet:No  Moving around from place to place: No  In the past year have you fallen or had a near fall?:No    Home Safety: Has smoke detector and wears seat belts. No firearms.    Opioid Use: none   Diet  and Exercise  Current exercise habits: pt says good Dietary issues discussed: pt reports a fairly healthy diet   Depression Screen  Q1: Over the past two weeks, have you felt down, depressed or hopeless? Depression is well-controlled Q2: Over the past two weeks, have you felt little interest or pleasure in doing things? no   The following portions of the patient's history were reviewed and updated as appropriate: allergies, current medications, past family history, past medical history, past social history, past surgical history and problem list.   Review of Systems  Denies hearing loss, and visual  loss Objective:   Vision:  See [facility] today Hearing: grossly normal Body mass index:  See vs page Msk: pt easily and quickly performs "get-up-and-go" from a sitting position Cognitive Impairment Assessment: cognition, memory and judgment appear normal.  remembers 3/3 at 5 minutes.  excellent recall.  can easily read and write a sentence.  alert and oriented x 3.     Assessment:   Medicare wellness utd on preventive parameters    Plan:   During the course of the visit the patient was educated and counseled about appropriate screening and preventive services including:        Fall prevention is advised today  Screening mammography  Is UTD Bone densitometry screening is ordered today.   Diabetes screening  Nutrition counseling is offered  advanced directives/end of life addressed today:  see healthcare directives hyperlink  Vaccines are updated as needed  Patient Instructions (the written plan) was given to the patient.

## 2018-03-18 ENCOUNTER — Encounter: Payer: Self-pay | Admitting: Family

## 2018-03-18 ENCOUNTER — Ambulatory Visit (INDEPENDENT_AMBULATORY_CARE_PROVIDER_SITE_OTHER): Payer: Medicare Other | Admitting: Family

## 2018-03-18 DIAGNOSIS — E119 Type 2 diabetes mellitus without complications: Secondary | ICD-10-CM | POA: Diagnosis not present

## 2018-03-18 DIAGNOSIS — M109 Gout, unspecified: Secondary | ICD-10-CM | POA: Diagnosis not present

## 2018-03-18 DIAGNOSIS — E559 Vitamin D deficiency, unspecified: Secondary | ICD-10-CM | POA: Diagnosis not present

## 2018-03-18 DIAGNOSIS — E785 Hyperlipidemia, unspecified: Secondary | ICD-10-CM | POA: Diagnosis not present

## 2018-03-18 MED ORDER — ALLOPURINOL 100 MG PO TABS: 100.0000 mg | ORAL_TABLET | Freq: Every day | ORAL | 3 refills | Status: DC

## 2018-03-18 MED ORDER — GABAPENTIN 300 MG PO CAPS: 600.0000 mg | ORAL_CAPSULE | Freq: Two times a day (BID) | ORAL | 3 refills | Status: DC

## 2018-03-18 MED ORDER — BUSPIRONE HCL 15 MG PO TABS: 15.0000 mg | ORAL_TABLET | Freq: Two times a day (BID) | ORAL | 3 refills | Status: DC

## 2018-03-18 MED ORDER — ONDANSETRON 4 MG PO TBDP: 4.0000 mg | ORAL_TABLET | Freq: Three times a day (TID) | ORAL | 1 refills | Status: DC | PRN

## 2018-03-18 NOTE — Progress Notes (Signed)
[name] is a 72 y.o. female with the following history as recorded in Epic[employer]:  Patient Active Problem List   Diagnosis Date Noted  . Dyspnea 02/16/2018  . 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 UTI (urinary tract infection) 11/27/2014  . UTI (lower urinary tract infection) 11/27/2014  . Acute cystitis with hematuria 11/13/2014  . Hemorrhoids, external 11/04/2014  . Wellness examination 10/03/2014  . Pain in joint, shoulder region 09/24/2014  . Cramp in limb 06/26/2014  . Rectal bleeding 05/02/2014  . External hemorrhoids 05/02/2014  . Hemorrhoids without complication 46/50/3546  . Pain in joint, lower leg 02/01/2014  . Vitamin D deficiency 01/16/2014  . Gastroparesis 09/14/2013  . Other dysphagia 09/04/2013  . Nausea alone 09/04/2013  . Iron deficiency anemia 08/28/2013  . Hypomagnesemia 05/11/2013  . Hypokalemia 05/11/2013  . Generalized weakness 05/11/2013  . Dehydration 11/25/2011  . Fatigue 11/16/2011  . Breast cancer ([place]) 09/29/2011  . Family history of breast cancer 08/19/2011  . Primary cancer of lower-inner quadrant of left female breast ([place]) 08/17/2011  . Neck pain 07/29/2011  . Routine general medical examination at a health care facility 06/28/2011  . Edema 11/26/2010  . CLOSTRIDIUM DIFFICILE COLITIS 02/07/2010  . Blind loop syndrome 10/01/2009  . Abdominal pain 10/01/2009  . Vitamin B 12 deficiency 08/30/2009  . 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 Rash and nonspecific skin eruption 08/13/2009  . GOITER, MULTINODULAR 04/02/2009  . CONTACT DERMATITIS&OTHER ECZEMA DUE UNSPEC CAUSE 02/20/2009  . URINARY CALCULUS 10/11/2008  . Asymptomatic menopausal state 10/11/2008  . BACK PAIN, CHRONIC 07/03/2008  . Esophageal reflux 06/12/2008  . Diarrhea 06/12/2008  . ABDOMINAL PAIN-RUQ 05/17/2008  . Diabetes ([place]) 12/10/2006  . Dyslipidemia 12/10/2006  . Gout 12/10/2006  . HYPERTENSION 12/10/2006  . DIVERTICULOSIS,  COLON 12/10/2006  . OSTEOARTHRITIS 12/10/2006  . ESOPHAGEAL STRICTURE 08/23/2002  . HIATAL HERNIA 08/23/2002  . INTERNAL HEMORRHOIDS 02/02/2001    Current Outpatient Medications  Medication Sig Dispense Refill  . allopurinol (ZYLOPRIM) 100 MG tablet Take 1 tablet (100 mg total) by mouth daily. 90 tablet 3  . aMILoride (MIDAMOR) 5 MG tablet Take 1 tablet (5 mg total) by mouth 2 (two) times daily. 90 tablet 6  . busPIRone (BUSPAR) 15 MG tablet Take 1 tablet (15 mg total) by mouth 2 (two) times daily. 90 tablet 3  . calcium carbonate (TUMS - DOSED IN MG ELEMENTAL CALCIUM) 500 MG chewable tablet Chew 1 tablet by mouth as needed for indigestion or heartburn.    . Calcium Carbonate-Vitamin D (CALCIUM-VITAMIN D) 600-200 MG-UNIT CAPS Take 1 capsule by mouth daily.      . cholestyramine (QUESTRAN) 4 g packet Take 1 packet in juice or water by mouth twice daily. 180 each 1  . Cyanocobalamin (VITAMIN B 12 PO) Take 1 tablet by mouth 2 (two) times daily.     Marland Kitchen dicyclomine (BENTYL) 10 MG capsule TAKE 1 CAPSULE (10 MG TOTAL) BY MOUTH 4 (FOUR) TIMES DAILY. 120 capsule 6  . diphenoxylate-atropine (LOMOTIL) 2.5-0.025 MG tablet Take 1 tablet by mouth 3 times daily as needed for diarrhea. 90 tablet 2  . gabapentin (NEURONTIN) 300 MG capsule Take 2 capsules (600 mg total) by mouth 2 (two) times daily. 360 capsule 3  . levothyroxine (SYNTHROID, LEVOTHROID) 200 MCG tablet Take 1 tablet (200 mcg total) by mouth daily before breakfast. 90 tablet 3  . Lidocaine, Anorectal, 5 % GEL Apply a pea-sized amount to the affected area 3 times daily. 30 g  0  . lovastatin (MEVACOR) 20 MG tablet TAKE 1 TABLET BY MOUTH  DAILY AT 6 PM. 90 tablet 2  . Magnesium Chloride 64 MG TBEC Take 64 mg by mouth daily.    . ondansetron (ZOFRAN-ODT) 4 MG disintegrating tablet Take 1 tablet (4 mg total) by mouth every 8 (eight) hours as needed. for nausea 90 tablet 1  . pantoprazole (PROTONIX) 40 MG tablet TAKE 1 TABLET BY MOUTH 30 MINUTES PRIOR  TO BREAKFAST AND SUPPER 60 tablet 11  . ranitidine (ZANTAC) 300 MG tablet Take 1 tablet (300 mg total) by mouth at bedtime. 90 tablet 2  . rifaximin (XIFAXAN) 550 MG TABS tablet Take 1 tablet by mouth 3 times daily for 14 days. (Patient taking differently: continuous as needed. Take 1 tablet by mouth 3 times daily for 14 days.) 42 tablet 0   No current facility-administered medications for this visit.    Facility-Administered Medications Ordered in Other Visits  Medication Dose Route Frequency Provider Last Rate Last Dose  . acetaminophen (TYLENOL) tablet 1,000 mg  1,000 mg Oral Once Amada Kingfisher, MD        Allergies: Aspirin; Iodinated diagnostic agents; Trazodone and nefazodone; Flagyl [metronidazole]; Iodine; and Morphine and related  Past Medical History:  Diagnosis Date  . ANEMIA, IRON DEFICIENCY 05/08/2009  . Angina   . ASYMPTOMATIC POSTMENOPAUSAL STATUS 10/11/2008  . Blood transfusion   . Blood transfusion without reported diagnosis   . Breast cancer ([place]) 09/29/11   invasive grade III ductal ca,assoc high grade dcis,ER/PR=neg  . C. difficile colitis   . Diverticulosis of colon (without mention of hemorrhage)   . Esophageal reflux 06/12/2008  . Gastroparesis   . GOITER, MULTINODULAR 04/02/2009  . Gout, unspecified   . H/O hiatal hernia   . History of lower GI bleeding   . History of radiation therapy 02/08/12-03/25/12   left breast,total 61gy  . Hypokalemia 05/11/2013  . Hypomagnesemia   . HYPOTHYROIDISM, POST-RADIATION 08/13/2009  . Internal hemorrhoids without mention of complication   . Kidney stones    "several"  . Leukopenia   . Migraines   . Obesity   . Osteoarthrosis, unspecified whether generalized or localized, unspecified site   . Other and unspecified hyperlipidemia   . Personal history of chemotherapy 2013  . Personal history of radiation therapy 2013   left  . PONV (postoperative nausea and vomiting)   . PUD (peptic ulcer disease)   . Short bowel syndrome    . Shortness of breath on exertion    "sometimes"  . Stricture and stenosis of esophagus   . Thyrotoxicosis without mention of goiter or other cause, without mention of thyrotoxic crisis or storm   . Type II or unspecified type diabetes mellitus without mention of complication, not stated as uncontrolled    no med in years diet controled  . Unspecified essential hypertension   . UTI (urinary tract infection)   . Varicose veins   . VITAMIN B12 DEFICIENCY 08/30/2009    Past Surgical History:  Procedure Laterality Date  . ABDOMINAL ADHESION SURGERY  1980's thru 1990's   "several"  . ABDOMINAL HYSTERECTOMY  1970's   with BSO  . BREAST BIOPSY Left 08/13/11   left breast lower inner quadrant  . BREAST BIOPSY Right 1985   Rt exc bx, benign  . BREAST LUMPECTOMY Left 08/2011  . BREAST LUMPECTOMY W/ NEEDLE LOCALIZATION  09/29/11   left  breast=lymph node,excision benign/ ER/PR=neg, her 2 Positive  . BUNIONECTOMY  [id number]  bilateral  . CHOLECYSTECTOMY  1990's  . COLON SURGERY     "several surgeries for short bowel syndrome"  . COLONOSCOPY  2012   multiple   . DILATION AND CURETTAGE OF UTERUS    . ESOPHAGOGASTRODUODENOSCOPY  2011   multiple   . EXCISIONAL HEMORRHOIDECTOMY  11/10/2016  . EYE SURGERY     "long time ago"  . FLEXIBLE SIGMOIDOSCOPY  2011   multiple   . KIDNEY STONE SURGERY  1990's   "tried to go up & get it but pushed it further up"  . LITHOTRIPSY     "4 or 5 times"  . MASTECTOMY W/ NODES PARTIAL  09/29/11   left  . PORT-A-CATH REMOVAL Right 12/19/2013   Procedure: MINOR REMOVAL PORT-A-CATH;  Surgeon: Adin Hector, MD;  Location: [facility];  Service: General;  Laterality: Right;  . PORTACATH PLACEMENT  09/29/2011   Procedure: INSERTION PORT-A-CATH;  Surgeon: Adin Hector, MD;  Location: [facility];  Service: General;  Laterality: N/A;  . Thyroid Ultrasound  12/1994 and 12/1995  . TOTAL KNEE ARTHROPLASTY Right 06/05/2015   Procedure: RIGHT TOTAL KNEE  ARTHROPLASTY;  Surgeon: Ninetta Lights, MD;  Location: [facility];  Service: Orthopedics;  Laterality: Right;  . VEIN LIGATION AND STRIPPING  1980's   Right leg    Family History  Problem Relation Age of Onset  . Esophageal cancer Son        deceased  . Diabetes Mother   . Heart disease Mother   . Kidney disease Sister   . Diabetes Father   . Hypertension Father   . Kidney disease Brother        x 3  . Colon cancer Paternal Uncle   . Breast cancer Maternal Aunt   . Breast cancer Paternal Aunt   . Breast cancer Cousin        Pt states she has 15+ cousins w/ Breast CA    Social History   Tobacco Use  . Smoking status: Former Smoker    Packs/day: 1.00    Years: 10.00    Pack years: 10.00    Types: Cigarettes    Last attempt to quit: 09/22/1985    Years since quitting: 32.5  . Smokeless tobacco: Never Used  Substance Use Topics  . Alcohol use: No    Comment: 09/29/11 "used to drink socially years ago"    Subjective:  Patient presents today as a new patient; transferring primary care from Dr. 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 Loanne Drilling; [facility]; current insurance does not participate with [employer]; needs note indicating medical necessity;      Objective:  Vitals:   03/18/18 0901  BP: 118/78  Pulse: 66  Temp: 98 F (36.7 C)  TempSrc: Oral  SpO2: 97%  Weight: 204 lb (92.5 kg)  Height: 5\' 4"  (1.626 m)    General: Well developed, well nourished, in no acute distress  Skin : Warm and dry.  Head: Normocephalic and atraumatic  Eyes: Sclera and conjunctiva clear;  pupils round and reactive to light; extraocular movements intact  Lungs: Respirations unlabored; clear to auscultation bilaterally without  wheeze, rales, rhonchi  CVS exam: normal rate and regular rhythm.  Musculoskeletal: No deformities; no active joint inflammation  Extremities: No edema, cyanosis, clubbing  Vessels: Symmetric bilaterally  Neurologic: Alert and oriented; speech intact; face symmetrical; moves all extremities well; CNII-XII intact without focal deficit   Assessment:  1. Hypomagnesemia   2. Vitamin D deficiency   3. Dyslipidemia   4. Gout without tophus   5. Type 2 diabetes mellitus without complication, without long-term current use of insulin (HCC)     Plan:  Reviewed labs done yesterday at Dr. Cordelia Pen office; refills updated as requested; continue with cardiology, endocrinology, GI, nephrology. [facility]; Follow-up in 1 year, sooner prn.  Discussed Shingrix- she defers at that time but will talk to her pharmacist.  No follow-ups on file.  No orders of the defined types were placed in this encounter.   Requested Prescriptions   Signed Prescriptions Disp Refills  . allopurinol (ZYLOPRIM) 100 MG tablet 90 tablet 3    Sig: Take 1 tablet (100 mg total) by mouth daily.  . busPIRone (BUSPAR) 15 MG tablet 90 tablet 3    Sig: Take 1 tablet (15 mg total) by mouth 2 (two) times daily.  Marland Kitchen gabapentin (NEURONTIN) 300 MG capsule 360 capsule 3    Sig: Take 2 capsules (600 mg total) by mouth 2 (two) times daily.  . ondansetron (ZOFRAN-ODT) 4 MG disintegrating tablet 90 tablet 1    Sig: Take 1 tablet (4 mg total) by mouth every 8 (eight) hours as needed. for nausea

## 2018-03-22 ENCOUNTER — Telehealth: Payer: Self-pay

## 2018-03-22 DIAGNOSIS — N182 Chronic kidney disease, stage 2 (mild): Secondary | ICD-10-CM | POA: Diagnosis not present

## 2018-03-22 DIAGNOSIS — I129 Hypertensive chronic kidney disease with stage 1 through stage 4 chronic kidney disease, or unspecified chronic kidney disease: Secondary | ICD-10-CM | POA: Diagnosis not present

## 2018-03-22 NOTE — Telephone Encounter (Signed)
Called to inform of lab results. LVM requesting returned call.

## 2018-03-22 NOTE — Telephone Encounter (Signed)
Patient is returning call and would like a call back.

## 2018-03-22 NOTE — Telephone Encounter (Signed)
-----   Message from Renato Shin, MD sent at 03/17/2018  6:40 PM EDT ----- please call patient: Normal--good

## 2018-03-22 NOTE — Telephone Encounter (Signed)
Returned pt call. Made aware of lab results. Verbalized acceptance and understanding.

## 2018-04-04 ENCOUNTER — Ambulatory Visit (INDEPENDENT_AMBULATORY_CARE_PROVIDER_SITE_OTHER): Payer: Medicare Other | Admitting: Internal Medicine

## 2018-04-04 ENCOUNTER — Encounter: Payer: Self-pay | Admitting: Internal Medicine

## 2018-04-04 ENCOUNTER — Telehealth: Payer: Self-pay

## 2018-04-04 VITALS — BP 122/76 | HR 71 | Ht 64.5 in | Wt 205.4 lb

## 2018-04-04 DIAGNOSIS — Z853 Personal history of malignant neoplasm of breast: Secondary | ICD-10-CM

## 2018-04-04 DIAGNOSIS — R079 Chest pain, unspecified: Secondary | ICD-10-CM

## 2018-04-04 DIAGNOSIS — M25775 Osteophyte, left foot: Secondary | ICD-10-CM | POA: Diagnosis not present

## 2018-04-04 DIAGNOSIS — Q2544 Congenital dilation of aorta: Secondary | ICD-10-CM | POA: Diagnosis not present

## 2018-04-04 DIAGNOSIS — R0609 Other forms of dyspnea: Secondary | ICD-10-CM

## 2018-04-04 DIAGNOSIS — M25774 Osteophyte, right foot: Secondary | ICD-10-CM | POA: Diagnosis not present

## 2018-04-04 DIAGNOSIS — R0683 Snoring: Secondary | ICD-10-CM | POA: Diagnosis not present

## 2018-04-04 DIAGNOSIS — E118 Type 2 diabetes mellitus with unspecified complications: Secondary | ICD-10-CM

## 2018-04-04 DIAGNOSIS — E039 Hypothyroidism, unspecified: Secondary | ICD-10-CM

## 2018-04-04 DIAGNOSIS — Q2549 Other congenital malformations of aorta: Secondary | ICD-10-CM

## 2018-04-04 LAB — BASIC METABOLIC PANEL
BUN / CREAT RATIO: 19 (ref 12–28)
BUN: 20 mg/dL (ref 8–27)
CO2: 21 mmol/L (ref 20–29)
CREATININE: 1.04 mg/dL — AB (ref 0.57–1.00)
Calcium: 9.2 mg/dL (ref 8.7–10.3)
Chloride: 105 mmol/L (ref 96–106)
GFR calc Af Amer: 62 mL/min/{1.73_m2} (ref >59)
GFR, EST NON AFRICAN AMERICAN: 53 mL/min/{1.73_m2} — AB (ref >59)
GLUCOSE: 70 mg/dL (ref 65–99)
POTASSIUM: 4.1 mmol/L (ref 3.5–5.2)
SODIUM: 142 mmol/L (ref 134–144)

## 2018-04-04 MED ORDER — METOPROLOL TARTRATE 50 MG PO TABS: ORAL_TABLET | ORAL | 0 refills | Status: DC

## 2018-04-04 NOTE — Consult Note (Signed)
Cardiology Office Note:    Date:  04/04/2018   ID:  [name], DOB 09/13/1945, MRN [id number]  PCP:  [name], [place]  Cardiologist:  No primary care provider on file.  Electrophysiologist:  None   Referring MD: Rachel Shin, MD   Chest pain and shortness of breath  History of Present Illness:    [name] is a 72 y.o. female with a hx of left breast cancer with chemotherapy and radiation, GERD, hypothyroidism, and diabetes mellitus type 2 diet controlled.  She presents today with several weeks of chest discomfort and shortness of breath.  She tells me that her husband passed from a massive heart attack, and she is concerned because she does not want to suffer the same.  She tells me that she does not participate in formal exercise at the moment, but most active thing she does on a daily basis is volunteering at the food bank once a week.  While she is at the food bank she notices discomfort in her chest and her shortness of breath that occasionally makes her limit her time that she is there or slow down her activities.  She does not have significant discomfort at rest.  She occasionally has palpitations.  She denies PND or orthopnea.  Does endorse snoring but does not feel that she has sleep apnea.  She has never been screened for sleep apnea.  She does endorse bilateral leg swelling.  On occasion she will find that the swelling is significant and will call into her physician for a prescription for Lasix.  We have discussed daily weights today which she is not currently recording.  She has no recent syncope or presyncope.  She has a Myoview stress test from 2012 which was negative for inducible ischemia.  She recently had an echocardiogram which showed normal systolic function with an ejection fraction LVEF 55 to 60% with normal wall motion.  She has grade 1 diastolic dysfunction with indeterminate filling pressures.  She has trivial aortic valve regurgitation  and no other significant valvular heart disease.  There is a question of dilatation of the sinuses of Valsalva, recommending cardiac CT or TEE.   ONC HISTORY per Dr. Quillian Quince [name]'s note 10/05/2012: Stage I ER-/PR-/Her2Neu positive invasive ductal breast cancer.  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 ER negative PR negative Her2 Neu positive with Ki -67 53%, 2,  S/P porta cath placement.   She completed 4 cycles of Taxotere carboplatinum and Herceptin. Her treatment began in May 2013. She then proceeded with radiation therapy with concomitant Herceptin given every 3 weeks to finish out a year of treatment. Completion target  Oct 18, 2012.   Echo: 11/12 EF 50-55% lateral s' 8.4 (second peak) Echo: 4/13 EF 45-50% lateral s' 8.5 (second peak - 1st peak not seen) Echo:12/24/11  EF 50-55% lateral s' 8.7 ECHO: 03/28/12 EF 55% lateral S' unreadable but does not appear depressed. MR mild ECHO 07/07/12 EF 50-55% lateral S' 8.2 ECHO 10/05/12 EF 50-55% lateral S' 8.9V  Past Medical History:  Diagnosis Date  . ANEMIA, IRON DEFICIENCY 05/08/2009  . Angina   . ASYMPTOMATIC POSTMENOPAUSAL STATUS 10/11/2008  . Blood transfusion   . Blood transfusion without reported diagnosis   . Breast cancer ([facility]) 09/29/11   invasive grade III ductal ca,assoc high grade dcis,ER/PR=neg  . C. difficile colitis   . Diverticulosis of colon (without mention of  hemorrhage)   . Esophageal reflux 06/12/2008  . Gastroparesis   . GOITER, MULTINODULAR 04/02/2009  . Gout, unspecified   . H/O hiatal hernia   . History of lower GI bleeding   . History of radiation therapy 02/08/12-03/25/12   left breast,total 61gy  . Hypokalemia 05/11/2013  . Hypomagnesemia   . HYPOTHYROIDISM, POST-RADIATION 08/13/2009  . Internal hemorrhoids without mention of complication   . Kidney stones    "several"  . Leukopenia   . Migraines   . Obesity   .  Osteoarthrosis, unspecified whether generalized or localized, unspecified site   . Other and unspecified hyperlipidemia   . Personal history of chemotherapy 2013  . Personal history of radiation therapy 2013   left  . PONV (postoperative nausea and vomiting)   . PUD (peptic ulcer disease)   . Short bowel syndrome   . Shortness of breath on exertion    "sometimes"  . Stricture and stenosis of esophagus   . Thyrotoxicosis without mention of goiter or other cause, without mention of thyrotoxic crisis or storm   . Type II or unspecified type diabetes mellitus without mention of complication, not stated as uncontrolled    no med in years diet controled  . Unspecified essential hypertension   . UTI (urinary tract infection)   . Varicose veins   . VITAMIN B12 DEFICIENCY 08/30/2009    Past Surgical History:  Procedure Laterality Date  . ABDOMINAL ADHESION SURGERY  1980's thru 1990's   "several"  . ABDOMINAL HYSTERECTOMY  1970's   with BSO  . BREAST BIOPSY Left 08/13/11   left breast lower inner quadrant  . BREAST BIOPSY Right 1985   Rt exc bx, benign  . BREAST LUMPECTOMY Left 08/2011  . BREAST LUMPECTOMY W/ NEEDLE LOCALIZATION  09/29/11   left  breast=lymph node,excision benign/ ER/PR=neg, her 2 Positive  . BUNIONECTOMY  1970's   bilateral  . CHOLECYSTECTOMY  1990's  . COLON SURGERY     "several surgeries for short bowel syndrome"  . COLONOSCOPY  2012   multiple   . DILATION AND CURETTAGE OF UTERUS    . ESOPHAGOGASTRODUODENOSCOPY  2011   multiple   . EXCISIONAL HEMORRHOIDECTOMY  11/10/2016  . EYE SURGERY     "long time ago"  . FLEXIBLE SIGMOIDOSCOPY  2011   multiple   . KIDNEY STONE SURGERY  1990's   "tried to go up & get it but pushed it further up"  . LITHOTRIPSY     "4 or 5 times"  . MASTECTOMY W/ NODES PARTIAL  09/29/11   left  . PORT-A-CATH REMOVAL Right 12/19/2013   Procedure: MINOR REMOVAL PORT-A-CATH;  Surgeon: Adin Hector, MD;  Location: [facility];  Service: General;  Laterality: Right;  . PORTACATH PLACEMENT  09/29/2011   Procedure: INSERTION PORT-A-CATH;  Surgeon: Adin Hector, MD;  Location: [place] [facility];  Service: General;  Laterality: N/A;  . Thyroid Ultrasound  12/1994 and 12/1995  . TOTAL KNEE ARTHROPLASTY Right 06/05/2015   Procedure: RIGHT TOTAL KNEE ARTHROPLASTY;  Surgeon: Ninetta Lights, MD;  Location: [facility];  Service: Orthopedics;  Laterality: Right;  . VEIN LIGATION AND STRIPPING  1980's   Right leg    Current Medications: Current Meds  Medication Sig  . allopurinol (ZYLOPRIM) 100 MG tablet Take 1 tablet (100 mg total) by mouth daily.  Marland Kitchen aMILoride (MIDAMOR) 5 MG tablet Take 1 tablet (5 mg total) by mouth 2 (two) times daily.  . busPIRone (BUSPAR) 15 MG  tablet Take 1 tablet (15 mg total) by mouth 2 (two) times daily.  . calcium carbonate (TUMS - DOSED IN MG ELEMENTAL CALCIUM) 500 MG chewable tablet Chew 1 tablet by mouth as needed for indigestion or heartburn.  . Calcium Carbonate-Vitamin D (CALCIUM-VITAMIN D) 600-200 MG-UNIT CAPS Take 1 capsule by mouth daily.    . Cyanocobalamin (VITAMIN B 12 PO) Take 1 tablet by mouth 2 (two) times daily.   Marland Kitchen dicyclomine (BENTYL) 10 MG capsule TAKE 1 CAPSULE (10 MG TOTAL) BY MOUTH 4 (FOUR) TIMES DAILY.  . diphenoxylate-atropine (LOMOTIL) 2.5-0.025 MG tablet Take 1 tablet by mouth 3 times daily as needed for diarrhea.  . gabapentin (NEURONTIN) 300 MG capsule Take 2 capsules (600 mg total) by mouth 2 (two) times daily.  Marland Kitchen levothyroxine (SYNTHROID, LEVOTHROID) 200 MCG tablet Take 1 tablet (200 mcg total) by mouth daily before breakfast.  . lovastatin (MEVACOR) 20 MG tablet TAKE 1 TABLET BY MOUTH  DAILY AT 6 PM.  . ondansetron (ZOFRAN-ODT) 4 MG disintegrating tablet Take 1 tablet (4 mg total) by mouth every 8 (eight) hours as needed. for nausea  . pantoprazole (PROTONIX) 40 MG tablet TAKE 1 TABLET BY MOUTH 30 MINUTES PRIOR TO BREAKFAST AND SUPPER  . ranitidine (ZANTAC) 300 MG tablet Take 1  tablet (300 mg total) by mouth at bedtime.     Allergies:   Aspirin; Iodinated diagnostic agents; Trazodone and nefazodone; Flagyl [metronidazole]; Iodine; and Morphine and related   Social History   Socioeconomic History  . Marital status: Widowed    Spouse name: Not on file  . Number of children: 2  . Years of education: Not on file  . Highest education level: Not on file  Occupational History  . Occupation: RETIRED  Social Needs  . Financial resource strain: Not on file  . Food insecurity:    Worry: Not on file    Inability: Not on file  . Transportation needs:    Medical: Not on file    Non-medical: Not on file  Tobacco Use  . Smoking status: Former Smoker    Packs/day: 1.00    Years: 10.00    Pack years: 10.00    Types: Cigarettes    Last attempt to quit: 09/22/1985    Years since quitting: 32.5  . Smokeless tobacco: Never Used  Substance and Sexual Activity  . Alcohol use: No    Comment: 09/29/11 "used to drink socially years ago"  . Drug use: No  . Sexual activity: Never    Comment: HRT x many yrs  Lifestyle  . Physical activity:    Days per week: Not on file    Minutes per session: Not on file  . Stress: Not on file  Relationships  . Social connections:    Talks on phone: Not on file    Gets together: Not on file    Attends religious service: Not on file    Active member of club or organization: Not on file    Attends meetings of clubs or organizations: Not on file    Relationship status: Not on file  Other Topics Concern  . Not on file  Social History Narrative   Pt gets regular exercise     Family History: The patient's family history includes Breast cancer in her cousin, maternal aunt, and paternal aunt; Colon cancer in her paternal uncle; Diabetes in her father and mother; Esophageal cancer in her son; Heart disease in her mother; Hypertension in her father; Kidney disease in her  brother and sister.  ROS:   Please see the history of present  illness.    All other systems reviewed and are negative.  EKGs/Labs/Other Studies Reviewed:    The following studies were reviewed today:  EKG:  EKG is ordered today.  The ekg ordered today demonstrates normal sinus rhythm ventricular rate 71.  Recent Labs: 06/28/2017: Magnesium 1.5 08/18/2017: ALT 17; Hemoglobin 10.7; Platelets 237 02/16/2018: BUN 21; Creatinine, Ser 1.06; Potassium 4.3; Pro B Natriuretic peptide (BNP) 60.0; Sodium 137 03/17/2018: TSH 0.73  Recent Lipid Panel    Component Value Date/Time   CHOL 150 03/17/2018 1200   TRIG 112.0 03/17/2018 1200   HDL 75.50 03/17/2018 1200   CHOLHDL 2 03/17/2018 1200   VLDL 22.4 03/17/2018 1200   LDLCALC 52 03/17/2018 1200   LDLDIRECT 53.0 10/29/2015 1338    Physical Exam:    VS:  BP 122/76   Pulse 71   Ht 5' 4.5" (1.638 m)   Wt 205 lb 6.4 oz (93.2 kg)   BMI 34.71 kg/m     Wt Readings from Last 3 Encounters:  04/04/18 205 lb 6.4 oz (93.2 kg)  03/18/18 204 lb (92.5 kg)  03/17/18 203 lb 3.2 oz (92.2 kg)     Constitutional: No acute distress Eyes: pupils equally round and reactive to light, sclera non-icteric, normal conjunctiva and lids ENMT: normal dentition, moist mucous membranes Cardiovascular: regular rhythm, normal rate, no murmurs. S1 and S2 normal. Radial pulses normal bilaterally. No jugular venous distention.  Respiratory: clear to auscultation bilaterally GI : normal bowel sounds, soft and nontender. No distention.   MSK: extremities warm, well perfused. No edema.  LYMPH: No lymphadenopathy noted of the head and neck NEURO: grossly nonfocal exam, moves all extremities. PSYCH: alert and oriented x 3, normal mood and affect.   ASSESSMENT:    1. Chest pain, unspecified type   2. Dilatation of aortic sinus of Valsalva   3. Dyspnea on exertion   4. History of breast cancer   5. Hypothyroidism, unspecified type   6. Type 2 diabetes mellitus with complication, without long-term current use of insulin (HCC)     7. Snoring    PLAN:    1. Chest pain, unspecified type   2. Dilatation of aortic sinus of Valsalva   3. Dyspnea on exertion   To assess her chest pain further and further evaluate her possibly dilated aortic sinuses, we will obtain a CT coronary angiogram.  She does not feel she can exercise on a treadmill therefore CT coronary angiogram would be the next best test to determine severity of coronary stenosis with the addition of FFR.  4. History of breast cancer   5. Hypothyroidism, unspecified type   6. Type 2 diabetes mellitus with complication, without long-term current use of insulin (HCC)   7.      Snoring Her Epworth Sleepiness Scale score is at least 6, the form was filled out without numbering.   I will see her back in follow-up in 3 months time or sooner if necessary.  Medication Adjustments/Labs and Tests Ordered: Current medicines are reviewed at length with the patient today.  Concerns regarding medicines are outlined above.  Orders Placed This Encounter  Procedures  . CT CORONARY MORPH W/CTA COR W/SCORE W/CA W/CM &/OR WO/CM  . CT CORONARY FRACTIONAL FLOW RESERVE DATA PREP  . CT CORONARY FRACTIONAL FLOW RESERVE FLUID ANALYSIS  . Basic metabolic panel  . EKG 12-Lead   No orders of the defined types were  placed in this encounter.   Patient Instructions  Medication Instructions:  Your physician recommends that you continue on your current medications as directed. Please refer to the Current Medication list given to you today.  If you need a refill on your cardiac medications before your next appointment, please call your pharmacy.   Lab work: Art gallery manager today If you have labs (blood work) drawn today and your tests are completely normal, you will receive your results only by: Marland Kitchen MyChart Message (if you have MyChart) OR . A paper copy in the mail If you have any lab test that is abnormal or we need to change your treatment, we will call you to review the  results.  Testing/Procedures: Your physician has requested that you have cardiac CT. Cardiac computed tomography (CT) is a painless test that uses an x-ray machine to take clear, detailed pictures of your heart. For further information please visit HugeFiesta.[email or web address]. Please follow instruction sheet as given.   Follow-Up: At [facility], you and your health needs are our priority.  As part of our continuing mission to provide you with exceptional heart care, we have created designated Provider Care Teams.  These Care Teams include your primary Cardiologist (physician) and Advanced Practice Providers (APPs -  Physician Assistants and Nurse Practitioners) who all work together to provide you with the care you need, when you need it. You will need a follow up appointment in 3 months.  Please call our office 2 months in advance to schedule this appointment.  You may see Dr.Phillp Dolores  or one of the following Advanced Practice Providers on your designated Care Team:   Rosaria Ferries, PA-C . Jory Sims, DNP, ANP  Any Other Special Instructions Will Be Listed Below (If Applicable). Please take the written script with you to the [facility] for knee high compression stockings. Please wear them daily.   Please arrive at the [facility] main entrance of [facility] TBD AM (30-45 minutes prior to test start time)  [facility] [place], [place] [address] [phone]  Proceed to the [facility] Radiology Department (First Floor).  Please follow these instructions carefully (unless otherwise directed):  Hold all erectile dysfunction medications at least 48 hours prior to test.  On the Night Before the Test: . Be sure to Drink plenty of water. . Do not consume any caffeinated/decaffeinated beverages or chocolate 12 hours prior to your test. . Do not take any antihistamines 12 hours prior to your test.  On the Day of the Test: . Drink plenty of water. Do not  drink any water within one hour of the test. . Do not eat any food 4 hours prior to the test. . You may take your regular medications prior to the test.  . Take metoprolol (Lopressor) two hours prior to test. . HOLD Furosemide/Hydrochlorothiazide morning of the test.   *For Clinical Staff only. Please instruct patient the following:*        -Drink plenty of water       -Hold Furosemide/hydrochlorothiazide morning of the test       -Take metoprolol (Lopressor) 2 hours prior to test (if applicable).                  -If HR is less than 55 BPM- No Beta Blocker                -IF HR is greater than 55 BPM and patient is less than or equal to 75  yrs old Lopressor 120m x1.                -If HR is greater than 55 BPM and patient is greater than 75yrs old Lopressor 50 mg x1.     Do not give Lopressor to patients with an allergy to lopressor or anyone with asthma or active COPD symptoms (currently taking steroids).       After the Test: . Drink plenty of water. . After receiving IV contrast, you may experience a mild flushed feeling. This is normal. . On occasion, you may experience a mild rash up to 24 hours after the test. This is not dangerous. If this occurs, you can take Benadryl 25 mg and increase your fluid intake. . If you experience trouble breathing, this can be serious. If it is severe call 911 IMMEDIATELY. If it is mild, please call our office. . If you take any of these medications: Glipizide/Metformin, Avandament, Glucavance, please do not take 48 hours after completing test.       Signed, GElouise Munroe MD  04/04/2018 9:03 AM    [place]

## 2018-04-04 NOTE — Telephone Encounter (Signed)
Dr.Acharya wanted her to have a script for compression stocking to be worn daily. Called pt to adv her that [facility] does not take scripts for medical supplies only oral medications. Per Catherine she will need to take her script to a local medical supply store. [email or web address].

## 2018-04-04 NOTE — Telephone Encounter (Signed)
Spoke with pt adv pt that [facility] on fill scripts for medications. She will need to take the written script Dr.Acharya has written for knee high compression stocking 20-55mm HG to a local medica supply store. Pt rqst that the written script be mailed to her home. Pt mailing address verified with pt.

## 2018-04-04 NOTE — Patient Instructions (Addendum)
Medication Instructions:  Your physician recommends that you continue on your current medications as directed. Please refer to the Current Medication list given to you today.  1) Take Metoprolol 1 tablet (50mg ) 2 hours prior to your Cardiac CT  If you need a refill on your cardiac medications before your next appointment, please call your pharmacy.   Lab work: Art gallery manager today If you have labs (blood work) drawn today and your tests are completely normal, you will receive your results only by: Marland Kitchen MyChart Message (if you have MyChart) OR . A paper copy in the mail If you have any lab test that is abnormal or we need to change your treatment, we will call you to review the results.  Testing/Procedures: Your physician has requested that you have cardiac CT. Cardiac computed tomography (CT) is a painless test that uses an x-ray machine to take clear, detailed pictures of your heart. For further information please visit HugeFiesta.tn. Please follow instruction sheet as given.   Follow-Up: At [facility], you and your health needs are our priority.  As part of our continuing mission to provide you with exceptional heart care, we have created designated Provider Care Teams.  These Care Teams include your primary Cardiologist (physician) and Advanced Practice Providers (APPs -  Physician Assistants and Nurse Practitioners) who all work together to provide you with the care you need, when you need it. You will need a follow up appointment in 3 months.  Please call our office 2 months in advance to schedule this appointment.  You may see Dr.Acharya  or one of the following Advanced Practice Providers on your designated Care Team:   Rosaria Ferries, PA-C . Jory Sims, DNP, ANP  Any Other Special Instructions Will Be Listed Below (If Applicable). Please take the written script with you to the [facility] for knee high compression stockings. Please wear them daily.   Please arrive at the [facility]  main entrance of [facility] TBD AM (30-45 minutes prior to test start time)  [facility] [place], [place] [address] [phone]  Proceed to the [facility] Radiology Department (First Floor).  Please follow these instructions carefully (unless otherwise directed):  Hold all erectile dysfunction medications at least 48 hours prior to test.  On the Night Before the Test: . Be sure to Drink plenty of water. . Do not consume any caffeinated/decaffeinated beverages or chocolate 12 hours prior to your test. . Do not take any antihistamines 12 hours prior to your test.  On the Day of the Test: . Drink plenty of water. Do not drink any water within one hour of the test. . Do not eat any food 4 hours prior to the test. . You may take your regular medications prior to the test.  . Take metoprolol (Lopressor) two hours prior to test.       After the Test: . Drink plenty of water. . After receiving IV contrast, you may experience a mild flushed feeling. This is normal. . On occasion, you may experience a mild rash up to 24 hours after the test. This is not dangerous. If this occurs, you can take Benadryl 25 mg and increase your fluid intake. . If you experience trouble breathing, this can be serious. If it is severe call 911 IMMEDIATELY. If it is mild, please call our office. . If you take any of these medications: Glipizide/Metformin, Avandament, Glucavance, please do not take 48 hours after completing test.

## 2018-04-06 ENCOUNTER — Other Ambulatory Visit: Payer: Self-pay | Admitting: Family

## 2018-04-06 MED ORDER — AMILORIDE HCL 5 MG PO TABS: 5.0000 mg | ORAL_TABLET | Freq: Two times a day (BID) | ORAL | 3 refills | Status: DC

## 2018-04-06 MED ORDER — ONDANSETRON 4 MG PO TBDP: 4.0000 mg | ORAL_TABLET | Freq: Three times a day (TID) | ORAL | 0 refills | Status: DC | PRN

## 2018-04-06 NOTE — Telephone Encounter (Signed)
Phone call to pt.  Made aware that [name] ordered the [facility] (Zofran) on 03/18/18; #90; RF x 1; advised that this was sent to [name], and to contact them for a refill.  Pt. Verb. Understanding.

## 2018-04-06 NOTE — Telephone Encounter (Signed)
Copied from [place] [phone]. Topic: Quick Communication - See Telephone Encounter >> Apr 06, 2018 10:35 AM Ivar Drape wrote: CRM for notification. See Telephone encounter for: 04/06/18. Patient would like a refill on her aMILoride (MIDAMOR) 5 MG tablet and her ondansetron (ZOFRAN-ODT) 4 MG disintegrating tablet medications and have them sent to her preferred pharmacy [facility], Fax: [phone]

## 2018-04-06 NOTE — Telephone Encounter (Signed)
Requested medication (s) are due for refill today:  yes  Requested medication (s) are on the active medication list:  yes  Future visit scheduled:  no  Last Refill: Amiloride; 02/16/17; #90; RF x 6 per Dr. Loanne Drilling  Notified pt. to contact [facility] re: refill on Ondansetron; LRF 03/18/18; #90; RF x 1; pt. verb. Understanding.    Requested Prescriptions  Pending Prescriptions Disp Refills   aMILoride (MIDAMOR) 5 MG tablet 90 tablet 6    Sig: Take 1 tablet (5 mg total) by mouth 2 (two) times daily.     Cardiovascular:  Diuretics - Potassium Sparing Failed - 04/06/2018 10:41 AM      Failed - Cr in normal range and within 360 days    Creatinine  Date Value Ref Range Status  08/18/2016 1.1 0.6 - 1.1 mg/dL Final   Creatinine, Ser  Date Value Ref Range Status  04/04/2018 1.04 (H) 0.57 - 1.00 mg/dL Final         Passed - K in normal range and within 360 days    Potassium  Date Value Ref Range Status  04/04/2018 4.1 3.5 - 5.2 mmol/L Final  08/18/2016 4.6 3.5 - 5.1 mEq/L Final         Passed - Na in normal range and within 360 days    Sodium  Date Value Ref Range Status  04/04/2018 142 134 - 144 mmol/L Final  08/18/2016 142 136 - 145 mEq/L Final         Passed - Last BP in normal range    BP Readings from Last 1 Encounters:  04/04/18 122/76         Passed - Valid encounter within last 6 months    Recent Outpatient Visits          2 weeks ago Hypomagnesemia   [place], Marvis Repress, [place]   3 years ago Acute cystitis with hematuria   [place], [name] L, MD   6 years ago Anemia, unspecified   [place] Ellison, Sean, MD   6 years ago Neck pain on left side   [place], MD   6 years ago Other B-complex deficiencies   [place] Ellison, Hilliard Clark, MD            ondansetron (ZOFRAN-ODT) 4 MG disintegrating  tablet 90 tablet 1    Sig: Take 1 tablet (4 mg total) by mouth every 8 (eight) hours as needed. for nausea     Not Delegated - Gastroenterology: Antiemetics Failed - 04/06/2018 10:41 AM      Failed - This refill cannot be delegated      Passed - Valid encounter within last 6 months    Recent Outpatient Visits          2 weeks ago Hypomagnesemia   [place], Marvis Repress, [place]   3 years ago Acute cystitis with hematuria   [facility] -Mayer Camel, MD   6 years ago Anemia, unspecified   [place] Ellison, Sean, MD   6 years ago Neck pain on left side   [facility] -Georges Mouse, MD   6 years ago Other B-complex deficiencies   [facility] -Areatha Keas, MD

## 2018-04-08 ENCOUNTER — Encounter: Payer: Self-pay | Admitting: Family

## 2018-04-08 ENCOUNTER — Ambulatory Visit (INDEPENDENT_AMBULATORY_CARE_PROVIDER_SITE_OTHER): Payer: Medicare Other | Admitting: Family

## 2018-04-08 VITALS — BP 132/78 | HR 68 | Temp 97.9°F | Ht 64.5 in | Wt 206.1 lb

## 2018-04-08 DIAGNOSIS — M542 Cervicalgia: Secondary | ICD-10-CM

## 2018-04-08 MED ORDER — METHOCARBAMOL 500 MG PO TABS: 500.0000 mg | ORAL_TABLET | Freq: Three times a day (TID) | ORAL | 0 refills | Status: DC | PRN

## 2018-04-08 MED ORDER — HYDROCODONE-ACETAMINOPHEN 5-325 MG PO TABS: 1.0000 | ORAL_TABLET | Freq: Four times a day (QID) | ORAL | 0 refills | Status: AC | PRN

## 2018-04-08 NOTE — Progress Notes (Signed)
[name] is a 72 y.o. female with the following history as recorded in EpicCare:  Patient Active Problem List   Diagnosis Date Noted  . Dyspnea 02/16/2018  . 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 UTI (urinary tract infection) 11/27/2014  . UTI (lower urinary tract infection) 11/27/2014  . Acute cystitis with hematuria 11/13/2014  . Hemorrhoids, external 11/04/2014  . Wellness examination 10/03/2014  . Pain in joint, shoulder region 09/24/2014  . Cramp in limb 06/26/2014  . Rectal bleeding 05/02/2014  . External hemorrhoids 05/02/2014  . Hemorrhoids without complication 33/82/5053  . Pain in joint, lower leg 02/01/2014  . Vitamin D deficiency 01/16/2014  . Gastroparesis 09/14/2013  . Other dysphagia 09/04/2013  . Nausea alone 09/04/2013  . Iron deficiency anemia 08/28/2013  . Hypomagnesemia 05/11/2013  . Hypokalemia 05/11/2013  . Generalized weakness 05/11/2013  . Dehydration 11/25/2011  . Fatigue 11/16/2011  . Breast cancer ([place]) 09/29/2011  . Family history of breast cancer 08/19/2011  . Primary cancer of lower-inner quadrant of left female breast ([place]) 08/17/2011  . Neck pain 07/29/2011  . Routine general medical examination at a health care facility 06/28/2011  . Edema 11/26/2010  . CLOSTRIDIUM DIFFICILE COLITIS 02/07/2010  . Blind loop syndrome 10/01/2009  . Abdominal pain 10/01/2009  . Vitamin B 12 deficiency 08/30/2009  . 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 Rash and nonspecific skin eruption 08/13/2009  . GOITER, MULTINODULAR 04/02/2009  . CONTACT DERMATITIS&OTHER ECZEMA DUE UNSPEC CAUSE 02/20/2009  . URINARY CALCULUS 10/11/2008  . Asymptomatic menopausal state 10/11/2008  . BACK PAIN, CHRONIC 07/03/2008  . Esophageal reflux 06/12/2008  . Diarrhea 06/12/2008  . ABDOMINAL PAIN-RUQ 05/17/2008  . Diabetes ([place]) 12/10/2006  . Dyslipidemia 12/10/2006  . Gout 12/10/2006  . HYPERTENSION 12/10/2006  . DIVERTICULOSIS,  COLON 12/10/2006  . OSTEOARTHRITIS 12/10/2006  . ESOPHAGEAL STRICTURE 08/23/2002  . HIATAL HERNIA 08/23/2002  . INTERNAL HEMORRHOIDS 02/02/2001    Current Outpatient Medications  Medication Sig Dispense Refill  . allopurinol (ZYLOPRIM) 100 MG tablet Take 1 tablet (100 mg total) by mouth daily. 90 tablet 3  . aMILoride (MIDAMOR) 5 MG tablet Take 1 tablet (5 mg total) by mouth 2 (two) times daily. 180 tablet 3  . busPIRone (BUSPAR) 15 MG tablet Take 1 tablet (15 mg total) by mouth 2 (two) times daily. 90 tablet 3  . calcium carbonate (TUMS - DOSED IN MG ELEMENTAL CALCIUM) 500 MG chewable tablet Chew 1 tablet by mouth as needed for indigestion or heartburn.    . Calcium Carbonate-Vitamin D (CALCIUM-VITAMIN D) 600-200 MG-UNIT CAPS Take 1 capsule by mouth daily.      . cholestyramine (QUESTRAN) 4 g packet Take 1 packet in juice or water by mouth twice daily. 180 each 1  . Cyanocobalamin (VITAMIN B 12 PO) Take 1 tablet by mouth 2 (two) times daily.     Marland Kitchen dicyclomine (BENTYL) 10 MG capsule TAKE 1 CAPSULE (10 MG TOTAL) BY MOUTH 4 (FOUR) TIMES DAILY. 120 capsule 6  . diphenoxylate-atropine (LOMOTIL) 2.5-0.025 MG tablet Take 1 tablet by mouth 3 times daily as needed for diarrhea. 90 tablet 2  . gabapentin (NEURONTIN) 300 MG capsule Take 2 capsules (600 mg total) by mouth 2 (two) times daily. 360 capsule 3  . levothyroxine (SYNTHROID, LEVOTHROID) 200 MCG tablet Take 1 tablet (200 mcg total) by mouth daily before breakfast. 90 tablet 3  . Lidocaine, Anorectal, 5 % GEL Apply a pea-sized amount to the affected area 3 times daily. 30 g  0  . lovastatin (MEVACOR) 20 MG tablet TAKE 1 TABLET BY MOUTH  DAILY AT 6 PM. 90 tablet 2  . Magnesium Chloride 64 MG TBEC Take 64 mg by mouth daily.    . metoprolol tartrate (LOPRESSOR) 50 MG tablet Take 1 tablet (50mg ) 2 hours prior to your Cardiac CT 1 tablet 0  . ondansetron (ZOFRAN-ODT) 4 MG disintegrating tablet Take 1 tablet (4 mg total) by mouth every 8 (eight) hours  as needed. for nausea 90 tablet 0  . pantoprazole (PROTONIX) 40 MG tablet TAKE 1 TABLET BY MOUTH 30 MINUTES PRIOR TO BREAKFAST AND SUPPER 60 tablet 11  . ranitidine (ZANTAC) 300 MG tablet Take 1 tablet (300 mg total) by mouth at bedtime. 90 tablet 2  . rifaximin (XIFAXAN) 550 MG TABS tablet Take 1 tablet by mouth 3 times daily for 14 days. 42 tablet 0  . HYDROcodone-acetaminophen (NORCO/VICODIN) 5-325 MG tablet Take 1 tablet by mouth every 6 (six) hours as needed for up to 10 days. 20 tablet 0  . methocarbamol (ROBAXIN) 500 MG tablet Take 1 tablet (500 mg total) by mouth every 8 (eight) hours as needed for muscle spasms. 30 tablet 0   No current facility-administered medications for this visit.    Facility-Administered Medications Ordered in Other Visits  Medication Dose Route Frequency Provider Last Rate Last Dose  . acetaminophen (TYLENOL) tablet 1,000 mg  1,000 mg Oral Once Amada Kingfisher, MD        Allergies: Aspirin; Iodinated diagnostic agents; Trazodone and nefazodone; Flagyl [metronidazole]; Iodine; and Morphine and related  Past Medical History:  Diagnosis Date  . ANEMIA, IRON DEFICIENCY 05/08/2009  . Angina   . ASYMPTOMATIC POSTMENOPAUSAL STATUS 10/11/2008  . Blood transfusion   . Blood transfusion without reported diagnosis   . Breast cancer ([place]) 09/29/11   invasive grade III ductal ca,assoc high grade dcis,ER/PR=neg  . C. difficile colitis   . Diverticulosis of colon (without mention of hemorrhage)   . Esophageal reflux 06/12/2008  . Gastroparesis   . GOITER, MULTINODULAR 04/02/2009  . Gout, unspecified   . H/O hiatal hernia   . History of lower GI bleeding   . History of radiation therapy 02/08/12-03/25/12   left breast,total 61gy  . Hypokalemia 05/11/2013  . Hypomagnesemia   . HYPOTHYROIDISM, POST-RADIATION 08/13/2009  . Internal hemorrhoids without mention of complication   . Kidney stones    "several"  . Leukopenia   . Migraines   . Obesity   . Osteoarthrosis,  unspecified whether generalized or localized, unspecified site   . Other and unspecified hyperlipidemia   . Personal history of chemotherapy 2013  . Personal history of radiation therapy 2013   left  . PONV (postoperative nausea and vomiting)   . PUD (peptic ulcer disease)   . Short bowel syndrome   . Shortness of breath on exertion    "sometimes"  . Stricture and stenosis of esophagus   . Thyrotoxicosis without mention of goiter or other cause, without mention of thyrotoxic crisis or storm   . Type II or unspecified type diabetes mellitus without mention of complication, not stated as uncontrolled    no med in years diet controled  . Unspecified essential hypertension   . UTI (urinary tract infection)   . Varicose veins   . VITAMIN B12 DEFICIENCY 08/30/2009    Past Surgical History:  Procedure Laterality Date  . ABDOMINAL ADHESION SURGERY  1980's thru 1990's   "several"  . ABDOMINAL HYSTERECTOMY  1970's   with BSO  .  BREAST BIOPSY Left 08/13/11   left breast lower inner quadrant  . BREAST BIOPSY Right 1985   Rt exc bx, benign  . BREAST LUMPECTOMY Left 08/2011  . BREAST LUMPECTOMY W/ NEEDLE LOCALIZATION  09/29/11   left  breast=lymph node,excision benign/ ER/PR=neg, her 2 Positive  . BUNIONECTOMY  1970's   bilateral  . CHOLECYSTECTOMY  1990's  . COLON SURGERY     "several surgeries for short bowel syndrome"  . COLONOSCOPY  2012   multiple   . DILATION AND CURETTAGE OF UTERUS    . ESOPHAGOGASTRODUODENOSCOPY  2011   multiple   . EXCISIONAL HEMORRHOIDECTOMY  11/10/2016  . EYE SURGERY     "long time ago"  . FLEXIBLE SIGMOIDOSCOPY  2011   multiple   . KIDNEY STONE SURGERY  1990's   "tried to go up & get it but pushed it further up"  . LITHOTRIPSY     "4 or 5 times"  . MASTECTOMY W/ NODES PARTIAL  09/29/11   left  . PORT-A-CATH REMOVAL Right 12/19/2013   Procedure: MINOR REMOVAL PORT-A-CATH;  Surgeon: Adin Hector, MD;  Location: [facility];  Service:  General;  Laterality: Right;  . PORTACATH PLACEMENT  09/29/2011   Procedure: INSERTION PORT-A-CATH;  Surgeon: Adin Hector, MD;  Location: [facility];  Service: General;  Laterality: N/A;  . Thyroid Ultrasound  12/1994 and 12/1995  . TOTAL KNEE ARTHROPLASTY Right 06/05/2015   Procedure: RIGHT TOTAL KNEE ARTHROPLASTY;  Surgeon: Ninetta Lights, MD;  Location: [facility];  Service: Orthopedics;  Laterality: Right;  . VEIN LIGATION AND STRIPPING  1980's   Right leg    Family History  Problem Relation Age of Onset  . Esophageal cancer Son        deceased  . Diabetes Mother   . Heart disease Mother   . Kidney disease Sister   . Diabetes Father   . Hypertension Father   . Kidney disease Brother        x 3  . Colon cancer Paternal Uncle   . Breast cancer Maternal Aunt   . Breast cancer Paternal Aunt   . Breast cancer Cousin        Pt states she has 15+ cousins w/ Breast CA    Social History   Tobacco Use  . Smoking status: Former Smoker    Packs/day: 1.00    Years: 10.00    Pack years: 10.00    Types: Cigarettes    Last attempt to quit: 09/22/1985    Years since quitting: 32.5  . Smokeless tobacco: Never Used  Substance Use Topics  . Alcohol use: No    Comment: 09/29/11 "used to drink socially years ago"    Subjective:  3 day history of neck pain; similar symptoms earlier this year- was evaluated by orthopedics/ PT recommended; patient has taken Aleve with no benefit in the past few days. Known history of arthritis; feels like symptoms related to weather change; Requesting refill on pain medicine and muscle relaxers as given by her previous provider.      Objective:  Vitals:   04/08/18 1041  BP: 132/78  Pulse: 68  Temp: 97.9 F (36.6 C)  TempSrc: Oral  SpO2: 98%  Weight: 206 lb 1.9 oz (93.5 kg)  Height: 5' 4.5" (1.638 m)    General: Well developed, well nourished, in no acute distress  Skin : Warm and dry.  Head: Normocephalic and atraumatic  Lungs: Respirations unlabored;  clear to auscultation bilaterally  without wheeze, rales, rhonchi  CVS exam: normal rate and regular rhythm.  Neurologic: Alert and oriented; speech intact; face symmetrical; moves all extremities well; CNII-XII intact without focal deficit   Assessment:  1. Neck pain     Plan:  Does not do well with NSAIDs due to GI issues; chronic issue for her- known arthritis; will refill muscle relaxer and short-term refill given on Norco; if pain persists, she is to go back to her orthopedist.   No follow-ups on file.  No orders of the defined types were placed in this encounter.   Requested Prescriptions   Signed Prescriptions Disp Refills  . HYDROcodone-acetaminophen (NORCO/VICODIN) 5-325 MG tablet 20 tablet 0    Sig: Take 1 tablet by mouth every 6 (six) hours as needed for up to 10 days.  . methocarbamol (ROBAXIN) 500 MG tablet 30 tablet 0    Sig: Take 1 tablet (500 mg total) by mouth every 8 (eight) hours as needed for muscle spasms.

## 2018-04-18 ENCOUNTER — Ambulatory Visit: Payer: Medicare Other | Admitting: Podiatry

## 2018-04-20 ENCOUNTER — Ambulatory Visit (INDEPENDENT_AMBULATORY_CARE_PROVIDER_SITE_OTHER): Payer: Medicare Other | Admitting: Podiatry

## 2018-04-20 ENCOUNTER — Encounter: Payer: Self-pay | Admitting: Podiatry

## 2018-04-20 ENCOUNTER — Ambulatory Visit (INDEPENDENT_AMBULATORY_CARE_PROVIDER_SITE_OTHER): Payer: Medicare Other

## 2018-04-20 ENCOUNTER — Telehealth: Payer: Self-pay | Admitting: *Deleted

## 2018-04-20 ENCOUNTER — Other Ambulatory Visit: Payer: Self-pay | Admitting: Podiatry

## 2018-04-20 VITALS — BP 101/62 | HR 68

## 2018-04-20 DIAGNOSIS — M79671 Pain in right foot: Secondary | ICD-10-CM | POA: Diagnosis not present

## 2018-04-20 DIAGNOSIS — M898X7 Other specified disorders of bone, ankle and foot: Secondary | ICD-10-CM

## 2018-04-20 DIAGNOSIS — M79672 Pain in left foot: Secondary | ICD-10-CM | POA: Diagnosis not present

## 2018-04-20 DIAGNOSIS — L600 Ingrowing nail: Secondary | ICD-10-CM

## 2018-04-20 NOTE — Patient Instructions (Addendum)
Pre-Operative Instructions  Congratulations, you have decided to take an important step towards improving your quality of life.  You can be assured that the doctors and staff at [facility] will be with you every step of the way.  Here are some important things you should know:  1. Plan to be at the surgery center/hospital at least 1 (one) hour prior to your scheduled time, unless otherwise directed by the surgical center/hospital staff.  You must have a responsible adult accompany you, remain during the surgery and drive you home.  Make sure you have directions to the surgical center/hospital to ensure you arrive on time. 2. If you are having surgery at [facility] or [facility] hospitals, you will need a copy of your medical history and physical form from your family physician within one month prior to the date of surgery. We will give you a form for your primary physician to complete.  3. We make every effort to accommodate the date you request for surgery.  However, there are times where surgery dates or times have to be moved.  We will contact you as soon as possible if a change in schedule is required.   4. No aspirin/ibuprofen for one week before surgery.  If you are on aspirin, any non-steroidal anti-inflammatory medications (Mobic, Aleve, Ibuprofen) should not be taken seven (7) days prior to your surgery.  You make take Tylenol for pain prior to surgery.  5. Medications - If you are taking daily heart and blood pressure medications, seizure, reflux, allergy, asthma, anxiety, pain or diabetes medications, make sure you notify the surgery center/hospital before the day of surgery so they can tell you which medications you should take or avoid the day of surgery. 6. No food or drink after midnight the night before surgery unless directed otherwise by surgical center/hospital staff. 7. No alcoholic beverages 24-hours prior to surgery.  No smoking 24-hours prior or 24-hours after  surgery. 8. Wear loose pants or shorts. They should be loose enough to fit over bandages, boots, and casts. 9. Don't wear slip-on shoes. Sneakers are preferred. 10. Bring your boot with you to the surgery center/hospital.  Also bring crutches or a walker if your physician has prescribed it for you.  If you do not have this equipment, it will be provided for you after surgery. 11. If you have not been contacted by the surgery center/hospital by the day before your surgery, call to confirm the date and time of your surgery. 12. Leave-time from work may vary depending on the type of surgery you have.  Appropriate arrangements should be made prior to surgery with your employer. 13. Prescriptions will be provided immediately following surgery by your doctor.  Fill these as soon as possible after surgery and take the medication as directed. Pain medications will not be refilled on weekends and must be approved by the doctor. 14. Remove nail polish on the operative foot and avoid getting pedicures prior to surgery. 15. Wash the night before surgery.  The night before surgery wash the foot and leg well with water and the antibacterial soap provided. Be sure to pay special attention to beneath the toenails and in between the toes.  Wash for at least three (3) minutes. Rinse thoroughly with water and dry well with a towel.  Perform this wash unless told not to do so by your physician.  Enclosed: 1 Ice pack (please put in freezer the night before surgery)   1 Hibiclens skin cleaner     Pre-op instructions  If you have any questions regarding the instructions, please do not hesitate to call our office.  [place]: [address], [place], [place] [address] -- [phone]  [place]: [address] [place], [place] [address] -- [phone]  [place]: [address]  [place], [place] [address] -- [phone]  [place]: [address], [address], [place], [place] [address] -- [phone]  Website:  [email or web address] 

## 2018-04-22 NOTE — Telephone Encounter (Signed)
"  I'd like to schedule my surgery with Dr. Amalia Hailey."  His next available date is December 27.  "Okay, put me down for that date."  I'll get you scheduled.

## 2018-04-24 NOTE — Progress Notes (Signed)
Subjective: Patient presents today for evaluation of pain to the lateral border of bilateral great toes that has been present for the past month. She also states she was seen by Dr. Gershon Mussel on 04/04/18 and was told she had bone spurs. Pressure to the great toes increases the pain. She has not had any treatment for the symptoms. Patient is concerned for possible ingrown nail. Patient presents today for further treatment and evaluation.  Past Medical History:  Diagnosis Date  . ANEMIA, IRON DEFICIENCY 05/08/2009  . Angina   . ASYMPTOMATIC POSTMENOPAUSAL STATUS 10/11/2008  . Blood transfusion   . Blood transfusion without reported diagnosis   . Breast cancer ([place]) 09/29/11   invasive grade III ductal ca,assoc high grade dcis,ER/PR=neg  . C. difficile colitis   . Diverticulosis of colon (without mention of hemorrhage)   . Esophageal reflux 06/12/2008  . Gastroparesis   . GOITER, MULTINODULAR 04/02/2009  . Gout, unspecified   . H/O hiatal hernia   . History of lower GI bleeding   . History of radiation therapy 02/08/12-03/25/12   left breast,total 61gy  . Hypokalemia 05/11/2013  . Hypomagnesemia   . HYPOTHYROIDISM, POST-RADIATION 08/13/2009  . Internal hemorrhoids without mention of complication   . Kidney stones    "several"  . Leukopenia   . Migraines   . Obesity   . Osteoarthrosis, unspecified whether generalized or localized, unspecified site   . Other and unspecified hyperlipidemia   . Personal history of chemotherapy 2013  . Personal history of radiation therapy 2013   left  . PONV (postoperative nausea and vomiting)   . PUD (peptic ulcer disease)   . Short bowel syndrome   . Shortness of breath on exertion    "sometimes"  . Stricture and stenosis of esophagus   . Thyrotoxicosis without mention of goiter or other cause, without mention of thyrotoxic crisis or storm   . Type II or unspecified type diabetes mellitus without mention of complication, not stated as uncontrolled    no  med in years diet controled  . Unspecified essential hypertension   . UTI (urinary tract infection)   . Varicose veins   . VITAMIN B12 DEFICIENCY 08/30/2009    Objective:  General: Well developed, nourished, in no acute distress, alert and oriented x3   Dermatology: Skin is warm, dry and supple bilateral. Lateral borders of the bilateral great toes appear to be erythematous with evidence of an ingrowing nail. Pain on palpation noted to the border of the nail fold. The remaining nails appear unremarkable at this time. There are no open sores, lesions.  Vascular: Dorsalis Pedis artery and Posterior Tibial artery pedal pulses palpable. No lower extremity edema noted.   Neruologic: Grossly intact via light touch bilateral.  Musculoskeletal: Muscular strength within normal limits in all groups bilateral. Normal range of motion noted to all pedal and ankle joints.   Radiographic Exam: Subungual exostosis noted to the bilateral great toes.   Assesement: #1 Paronychia with ingrowing nail lateral border bilateral great toes #2 Pain in toe #3 Incurvated nail #4 Subungual exostosis bilateral hallux   Plan of Care:  1. Patient evaluated. X-Rays reviewed.  2. Discussed treatment alternatives and plan of care. Explained nail avulsion procedure and post procedure course to patient. 3. Patient opted for permanent partial nail avulsion of the lateral borders of bilateral great toes.  4. Prior to procedure, local anesthesia infiltration utilized using 3 ml of a 50:50 mixture of 2% plain lidocaine and 0.5% plain marcaine in  a normal hallux block fashion and a betadine prep performed.  5. Partial permanent nail avulsion with chemical matrixectomy performed using [id number] applications of phenol followed by alcohol flush.  6. Light dressing applied. 7. Today we discussed the conservative versus surgical management of the presenting pathology. The patient opts for surgical management. All possible  complications and details of the procedure were explained. All patient questions were answered. No guarantees were expressed or implied. 8. Authorization for surgery was initiated today. Surgery will consist of subungual exostectomy bilateral great toes.  9. Return to clinic in 3 weeks for ingrown follow up.    Edrick Kins, DPM [facility]  Dr. Edrick Kins, [place]                                        [place], [place] [address]                Office [phone]  Fax [phone]

## 2018-04-26 ENCOUNTER — Ambulatory Visit (HOSPITAL_COMMUNITY): Admission: RE | Admit: 2018-04-26 | Payer: Medicare Other | Source: Ambulatory Visit

## 2018-04-26 ENCOUNTER — Ambulatory Visit (HOSPITAL_COMMUNITY)
Admission: RE | Admit: 2018-04-26 | Discharge: 2018-04-26 | Disposition: A | Discharge status: Home / Self Care | Payer: Medicare Other | Source: Ambulatory Visit | Attending: Internal Medicine | Admitting: Internal Medicine

## 2018-04-26 DIAGNOSIS — R079 Chest pain, unspecified: Secondary | ICD-10-CM | POA: Insufficient documentation

## 2018-04-26 MED ORDER — NITROGLYCERIN 0.4 MG SL SUBL: 0.8000 mg | SUBLINGUAL_TABLET | Freq: Once | SUBLINGUAL | Status: DC

## 2018-04-26 NOTE — Progress Notes (Signed)
CT Heart cancelled due to patient having contrast allergy. Patient will need to be premedicated before next test is scheduled.

## 2018-04-27 ENCOUNTER — Telehealth: Payer: Self-pay

## 2018-04-27 MED ORDER — PREDNISONE 50 MG PO TABS: ORAL_TABLET | ORAL | 0 refills | Status: DC

## 2018-04-27 NOTE — Telephone Encounter (Signed)
Called pt to discuss. Lmtcb.  Pt will need contrast allergy pre-med. Prior to Cor CTA. Rx sent to pt pharmacy for Prednisone 50mg  tab x3 1 tab 13 hours prior to test 1 tab 7 hours prior to test 1 tab 1 hour prior to test Benadryl 50mg  1 hour prior to test   Message fwd to [facility] to reschedule Cor CTA

## 2018-04-27 NOTE — Telephone Encounter (Signed)
-----   Message from Elouise Munroe, MD sent at 04/26/2018  5:18 PM EST ----- Efraim Kaufmann,  She has a contrast allergy and was not scanned today. Can we send premedication in, clarify her allergy, and have them reschedule her? Thanks, GA

## 2018-05-16 ENCOUNTER — Ambulatory Visit (INDEPENDENT_AMBULATORY_CARE_PROVIDER_SITE_OTHER): Payer: Medicare Other | Admitting: Podiatry

## 2018-05-16 ENCOUNTER — Encounter: Payer: Self-pay | Admitting: Podiatry

## 2018-05-16 DIAGNOSIS — L600 Ingrowing nail: Secondary | ICD-10-CM

## 2018-05-16 DIAGNOSIS — M898X7 Other specified disorders of bone, ankle and foot: Secondary | ICD-10-CM

## 2018-05-19 ENCOUNTER — Other Ambulatory Visit: Payer: Self-pay | Admitting: Family

## 2018-05-19 ENCOUNTER — Telehealth: Payer: Self-pay | Admitting: Gastroenterology

## 2018-05-19 NOTE — Telephone Encounter (Signed)
Left a message for patient to return my call. 

## 2018-05-19 NOTE — Progress Notes (Signed)
   Subjective: Patient presents today 2 weeks post ingrown nail permanent nail avulsion procedure of the lateral borders of the bilateral great toes. Patient states that the toe and nail fold is feeling much better. She reports some pain when wearing certain shoes. Patient is here for further evaluation and treatment.   Past Medical History:  Diagnosis Date  . ANEMIA, IRON DEFICIENCY 05/08/2009  . Angina   . ASYMPTOMATIC POSTMENOPAUSAL STATUS 10/11/2008  . Blood transfusion   . Blood transfusion without reported diagnosis   . Breast cancer ([place]) 09/29/11   invasive grade III ductal ca,assoc high grade dcis,ER/PR=neg  . C. difficile colitis   . Diverticulosis of colon (without mention of hemorrhage)   . Esophageal reflux 06/12/2008  . Gastroparesis   . GOITER, MULTINODULAR 04/02/2009  . Gout, unspecified   . H/O hiatal hernia   . History of lower GI bleeding   . History of radiation therapy 02/08/12-03/25/12   left breast,total 61gy  . Hypokalemia 05/11/2013  . Hypomagnesemia   . HYPOTHYROIDISM, POST-RADIATION 08/13/2009  . Internal hemorrhoids without mention of complication   . Kidney stones    "several"  . Leukopenia   . Migraines   . Obesity   . Osteoarthrosis, unspecified whether generalized or localized, unspecified site   . Other and unspecified hyperlipidemia   . Personal history of chemotherapy 2013  . Personal history of radiation therapy 2013   left  . PONV (postoperative nausea and vomiting)   . PUD (peptic ulcer disease)   . Short bowel syndrome   . Shortness of breath on exertion    "sometimes"  . Stricture and stenosis of esophagus   . Thyrotoxicosis without mention of goiter or other cause, without mention of thyrotoxic crisis or storm   . Type II or unspecified type diabetes mellitus without mention of complication, not stated as uncontrolled    no med in years diet controled  . Unspecified essential hypertension   . UTI (urinary tract infection)   . Varicose  veins   . VITAMIN B12 DEFICIENCY 08/30/2009    Objective: Skin is warm, dry and supple. Nail and respective nail fold appears to be healing appropriately. Open wound to the associated nail fold with a granular wound base and moderate amount of fibrotic tissue. Minimal drainage noted. Mild erythema around the periungual region likely due to phenol chemical matricectomy.  Assessment: #1 postop permanent partial nail avulsion lateral borders bilateral great toes #2 open wound periungual nail fold of respective digit.   Plan of care: #1 patient was evaluated  #2 debridement of open wound was performed to the periungual border of the respective toe using a currette. Antibiotic ointment and Band-Aid was applied. #3 Patient scheduled for surgery on 06/27/18. Surgery consists of subungual exostectomy bilateral great toes.  #4 patient is to return to clinic one week post op.   Edrick Kins, DPM [facility]  Dr. Edrick Kins, [place]                                        [place], [place] [address]                Office [phone]  Fax [phone]

## 2018-05-19 NOTE — Telephone Encounter (Signed)
Pt requested Lomotil refill @ [facility] mail order pharm. Last seen 02/09/18

## 2018-05-19 NOTE — Telephone Encounter (Signed)
Copied from [place] [id number]. Topic: Quick Communication - Rx Refill/Question >> May 19, 2018 11:31 AM Keene Breath wrote: Medication: levothyroxine (SYNTHROID, LEVOTHROID) 200 MCG tablet  Patient called to request refill for the above medication  Preferred Pharmacy (with phone number or street name): [facility] - [place], [place] - [address] (Phone) [phone] (Fax)

## 2018-05-19 NOTE — Telephone Encounter (Signed)
[name] can I send a 90 day supply of Lomotil to her mail order pharmacy?

## 2018-05-19 NOTE — Telephone Encounter (Signed)
OK for lomotil tid prn, #100, for 90 days. See office note with AE in 11/2017 regarding other medications to use regularly before resorting to lomotil and to use lomotil sparingly.

## 2018-05-20 MED ORDER — FAMOTIDINE 40 MG PO TABS: 40.0000 mg | ORAL_TABLET | Freq: Every day | ORAL | 1 refills | Status: DC

## 2018-05-20 MED ORDER — DIPHENOXYLATE-ATROPINE 2.5-0.025 MG PO TABS: ORAL_TABLET | ORAL | 2 refills | Status: DC

## 2018-05-20 NOTE — Telephone Encounter (Signed)
Informed patient I faxed prescription for Lomotil to [facility] mail order pharmacy but [name] states to use Lomotil sparingly and was suggested at her last office visit with the PA to try other medications before using Lomotil. Patient verbalized understanding. Patient also states she needs a replacement for Zantac sent to her mail order pharmacy. Prescription for famotidine sent to [facility] also.

## 2018-05-20 NOTE — Telephone Encounter (Signed)
Left a message for patient to return my call. 

## 2018-05-20 NOTE — Telephone Encounter (Signed)
Patient returning call.

## 2018-05-23 ENCOUNTER — Telehealth: Payer: Self-pay | Admitting: *Deleted

## 2018-05-23 NOTE — Telephone Encounter (Signed)
"  They had called me from the surgical place where they are supposed to do my surgery on the twenty-seventh of this month.  My granddaughter erased the number, the nurse wanted me to call her.  I was wondering if I could get the number from you all, the name of the place, and the address."

## 2018-05-27 ENCOUNTER — Encounter: Payer: Self-pay | Admitting: Podiatry

## 2018-05-27 ENCOUNTER — Other Ambulatory Visit: Payer: Self-pay | Admitting: Podiatry

## 2018-05-27 DIAGNOSIS — M25774 Osteophyte, right foot: Secondary | ICD-10-CM | POA: Diagnosis not present

## 2018-05-27 DIAGNOSIS — E7800 Pure hypercholesterolemia, unspecified: Secondary | ICD-10-CM | POA: Diagnosis not present

## 2018-05-27 DIAGNOSIS — M898X7 Other specified disorders of bone, ankle and foot: Secondary | ICD-10-CM | POA: Diagnosis not present

## 2018-05-27 DIAGNOSIS — M25775 Osteophyte, left foot: Secondary | ICD-10-CM | POA: Diagnosis not present

## 2018-05-27 MED ORDER — HYDROCODONE-ACETAMINOPHEN 10-325 MG PO TABS: 1.0000 | ORAL_TABLET | Freq: Four times a day (QID) | ORAL | 0 refills | Status: DC | PRN

## 2018-05-27 NOTE — Telephone Encounter (Signed)
I left the patient a message to call and let us know how she's doing.

## 2018-05-27 NOTE — Progress Notes (Signed)
Post op pain 

## 2018-05-27 NOTE — Telephone Encounter (Signed)
I called the patient and gave her the information about the surgical center.

## 2018-05-28 ENCOUNTER — Telehealth: Payer: Self-pay | Admitting: Podiatry

## 2018-05-28 NOTE — Telephone Encounter (Signed)
This patient calls to the office stating she is having excessive bleeding at the right great toe.  Patient is a Dr.  Amalia Hailey patient and he performed subungual exostosis both feet.  She believes her right great toe gauze is excessively bloodied.  She was told to apply additional gauze to the bloodied gauze that was applied at the surgical center.  She was told to rest an elevate her foot.  She was to apply ice to the ankle.  Patient is not experiencing pain.  Call back with any questions.   Gardiner Barefoot DPM

## 2018-05-30 ENCOUNTER — Ambulatory Visit (INDEPENDENT_AMBULATORY_CARE_PROVIDER_SITE_OTHER): Payer: Medicare Other

## 2018-05-30 ENCOUNTER — Encounter: Payer: Self-pay | Admitting: Podiatry

## 2018-05-30 ENCOUNTER — Ambulatory Visit (INDEPENDENT_AMBULATORY_CARE_PROVIDER_SITE_OTHER): Payer: Self-pay | Admitting: Podiatry

## 2018-05-30 VITALS — BP 110/62 | HR 58 | Temp 96.7°F

## 2018-05-30 DIAGNOSIS — M898X7 Other specified disorders of bone, ankle and foot: Secondary | ICD-10-CM

## 2018-05-30 DIAGNOSIS — Z9889 Other specified postprocedural states: Secondary | ICD-10-CM

## 2018-06-01 NOTE — Progress Notes (Signed)
   Subjective:  Patient presents today status post bilateral subungual exostectomies. DOS: 05/27/18. She reports increased pain today and is concerned about the right great toenail. She has been using the post op shoes as directed. She denies modifying factors. Patient is here for further evaluation and treatment.    Past Medical History:  Diagnosis Date  . ANEMIA, IRON DEFICIENCY 05/08/2009  . Angina   . ASYMPTOMATIC POSTMENOPAUSAL STATUS 10/11/2008  . Blood transfusion   . Blood transfusion without reported diagnosis   . Breast cancer ([place]) 09/29/11   invasive grade III ductal ca,assoc high grade dcis,ER/PR=neg  . C. difficile colitis   . Diverticulosis of colon (without mention of hemorrhage)   . Esophageal reflux 06/12/2008  . Gastroparesis   . GOITER, MULTINODULAR 04/02/2009  . Gout, unspecified   . H/O hiatal hernia   . History of lower GI bleeding   . History of radiation therapy 02/08/12-03/25/12   left breast,total 61gy  . Hypokalemia 05/11/2013  . Hypomagnesemia   . HYPOTHYROIDISM, POST-RADIATION 08/13/2009  . Internal hemorrhoids without mention of complication   . Kidney stones    "several"  . Leukopenia   . Migraines   . Obesity   . Osteoarthrosis, unspecified whether generalized or localized, unspecified site   . Other and unspecified hyperlipidemia   . Personal history of chemotherapy 2013  . Personal history of radiation therapy 2013   left  . PONV (postoperative nausea and vomiting)   . PUD (peptic ulcer disease)   . Short bowel syndrome   . Shortness of breath on exertion    "sometimes"  . Stricture and stenosis of esophagus   . Thyrotoxicosis without mention of goiter or other cause, without mention of thyrotoxic crisis or storm   . Type II or unspecified type diabetes mellitus without mention of complication, not stated as uncontrolled    no med in years diet controled  . Unspecified essential hypertension   . UTI (urinary tract infection)   . Varicose  veins   . VITAMIN B12 DEFICIENCY 08/30/2009      Objective/Physical Exam Neurovascular status intact.  Skin incisions appear to be well coapted with sutures and staples intact. No sign of infectious process noted. No dehiscence. No active bleeding noted. Moderate edema noted to the surgical extremity.  Radiographic Exam:  Osteotomies sites appear to be stable with routine healing.  Assessment: 1. s/p bilateral subungual exostectomies. DOS: 05/27/18.    Plan of Care:  1. Patient was evaluated. X-rays reviewed 2. Dressing changed. Keep clean, dry and intact for one week.  3. Continue using post op shoes.  4. Return to clinic in one week for suture removal.    Edrick Kins, DPM [facility]  Dr. Edrick Kins, [address]                                        [place], [place] [address]                Office [phone]  Fax [phone]

## 2018-06-08 ENCOUNTER — Ambulatory Visit (INDEPENDENT_AMBULATORY_CARE_PROVIDER_SITE_OTHER): Payer: Self-pay | Admitting: Podiatry

## 2018-06-08 ENCOUNTER — Encounter: Payer: Self-pay | Admitting: Podiatry

## 2018-06-08 DIAGNOSIS — Z9889 Other specified postprocedural states: Secondary | ICD-10-CM

## 2018-06-08 DIAGNOSIS — M898X7 Other specified disorders of bone, ankle and foot: Secondary | ICD-10-CM

## 2018-06-12 NOTE — Progress Notes (Signed)
   Subjective:  Patient presents today status post bilateral subungual exostectomies. DOS: 05/27/18. She states she is doing well. She reports the left hallux is doing better than the right. She denies any modifying factors. She has been using the post op shoes as directed. Patient is here for further evaluation and treatment.    Past Medical History:  Diagnosis Date  . ANEMIA, IRON DEFICIENCY 05/08/2009  . Angina   . ASYMPTOMATIC POSTMENOPAUSAL STATUS 10/11/2008  . Blood transfusion   . Blood transfusion without reported diagnosis   . Breast cancer ([place]) 09/29/11   invasive grade III ductal ca,assoc high grade dcis,ER/PR=neg  . C. difficile colitis   . Diverticulosis of colon (without mention of hemorrhage)   . Esophageal reflux 06/12/2008  . Gastroparesis   . GOITER, MULTINODULAR 04/02/2009  . Gout, unspecified   . H/O hiatal hernia   . History of lower GI bleeding   . History of radiation therapy 02/08/12-03/25/12   left breast,total 61gy  . Hypokalemia 05/11/2013  . Hypomagnesemia   . HYPOTHYROIDISM, POST-RADIATION 08/13/2009  . Internal hemorrhoids without mention of complication   . Kidney stones    "several"  . Leukopenia   . Migraines   . Obesity   . Osteoarthrosis, unspecified whether generalized or localized, unspecified site   . Other and unspecified hyperlipidemia   . Personal history of chemotherapy 2013  . Personal history of radiation therapy 2013   left  . PONV (postoperative nausea and vomiting)   . PUD (peptic ulcer disease)   . Short bowel syndrome   . Shortness of breath on exertion    "sometimes"  . Stricture and stenosis of esophagus   . Thyrotoxicosis without mention of goiter or other cause, without mention of thyrotoxic crisis or storm   . Type II or unspecified type diabetes mellitus without mention of complication, not stated as uncontrolled    no med in years diet controled  . Unspecified essential hypertension   . UTI (urinary tract infection)   .  Varicose veins   . VITAMIN B12 DEFICIENCY 08/30/2009      Objective/Physical Exam Neurovascular status intact.  Skin incisions appear to be well coapted with sutures and staples intact. No sign of infectious process noted. No dehiscence. No active bleeding noted. Moderate edema noted to the surgical extremity.  Assessment: 1. s/p bilateral subungual exostectomies. DOS: 05/27/18.    Plan of Care:  1. Patient was evaluated.  2. Sutures removed.  3. Discontinue using post op shoes. Recommended good shoe gear.  4. Return to clinic in 4 weeks for final follow up X-Rays.    Edrick Kins, DPM [facility]  Dr. Edrick Kins, [place]                                        [place], [place] [address]                Office [phone]  Fax [phone]

## 2018-06-13 ENCOUNTER — Other Ambulatory Visit: Payer: Self-pay | Admitting: Family

## 2018-06-13 MED ORDER — LEVOTHYROXINE SODIUM 200 MCG PO TABS: 200.0000 ug | ORAL_TABLET | Freq: Every day | ORAL | 3 refills | Status: DC

## 2018-06-13 NOTE — Telephone Encounter (Signed)
Requested medication (s) are due for refill today: no  Requested medication (s) are on the active medication list: yes  Last refill:  02/16/18   Future visit scheduled: no  Notes to clinic:  Pt using mail order pharmacy. Requesting a new prescription. Hypothyroid agents failed.  Requested Prescriptions  Pending Prescriptions Disp Refills   levothyroxine (SYNTHROID, LEVOTHROID) 200 MCG tablet 90 tablet 3    Sig: Take 1 tablet (200 mcg total) by mouth daily before breakfast.     Endocrinology:  Hypothyroid Agents Failed - 06/13/2018 11:53 AM      Failed - TSH needs to be rechecked within 3 months after an abnormal result. Refill until TSH is due.      Passed - TSH in normal range and within 360 days    TSH  Date Value Ref Range Status  03/17/2018 0.73 0.35 - 4.50 uIU/mL Final         Passed - Valid encounter within last 12 months    Recent Outpatient Visits          2 months ago Neck pain   [place], Marvis Repress, [place]   2 months ago Hypomagnesemia   [place], [facility] Repress, [place]   3 years ago Acute cystitis with hematuria   [facility] -Mayer Camel, MD   6 years ago Anemia, unspecified   [place]ison, Sean, MD   6 years ago Neck pain on left side   [facility] -Georges Mouse, [place]

## 2018-06-13 NOTE — Telephone Encounter (Addendum)
Copied from [place] [id number]. Topic: Quick Communication - See Telephone Encounter >> Jun 13, 2018 10:27 AM Ivar Drape wrote: CRM for notification. See Telephone encounter for: 06/13/18. Patient needs a new prescription for her levothyroxine (SYNTHROID, LEVOTHROID) 200 MCG tablet medication sent to her preferred pharmacy [facility] [place], [place] - [address]

## 2018-06-15 ENCOUNTER — Telehealth: Payer: Self-pay

## 2018-06-15 DIAGNOSIS — R072 Precordial pain: Secondary | ICD-10-CM

## 2018-06-15 DIAGNOSIS — R079 Chest pain, unspecified: Secondary | ICD-10-CM

## 2018-06-15 NOTE — Telephone Encounter (Signed)
Pt Cor CT that was scheduled for Nov 2019 was to rescheduled due to the pt needing pre-med for allergy to contrast. Pt has not been rescheduled. Contacted scheduling @[address] They were not aware, they will work on getting pt rescheduled. Asked them to send me an update with appt date and time so that I can f/u with the pt about her pre-test instructions.

## 2018-06-15 NOTE — Telephone Encounter (Signed)
Previous order for pt Cor CT was closed out. New order placed. [name] @ [address] aware.

## 2018-06-23 ENCOUNTER — Telehealth: Payer: Self-pay

## 2018-06-23 DIAGNOSIS — Z01810 Encounter for preprocedural cardiovascular examination: Secondary | ICD-10-CM

## 2018-06-23 MED ORDER — METOPROLOL TARTRATE 50 MG PO TABS: ORAL_TABLET | ORAL | 0 refills | Status: DC

## 2018-06-23 MED ORDER — PREDNISONE 50 MG PO TABS: ORAL_TABLET | ORAL | 0 refills | Status: DC

## 2018-06-23 NOTE — Telephone Encounter (Signed)
Call pt to go over pre-testing instructions for her Cardiac CT scheduled on 07/11/18. Pt will need pre-medication for hx of an allergy to contrast. Rx sent to pt pharmacy for Prednisone following pre-med protocol. Pt given dosage instructions for Prednisone and Benadryl to be taken prior to CT. An Rx has also been sent for a 1 time dose of Metoprolol Tartrate 50mg  (Dose based on prior instructions) to be taken 2 hours prior to testing. Pt will come to our office for a Bmet on 07/05/18. Adv pt that it is a non-fasting lab. Pt made aware of lab hours. Written pre-testing instructions will be left at the front desk for the pt to pick. Adv pt to contact the office if any questions or concerns. Pt verbalized understanding to the instructions given.

## 2018-07-05 ENCOUNTER — Other Ambulatory Visit: Payer: Self-pay | Admitting: *Deleted

## 2018-07-05 DIAGNOSIS — Z01810 Encounter for preprocedural cardiovascular examination: Secondary | ICD-10-CM

## 2018-07-06 ENCOUNTER — Ambulatory Visit (INDEPENDENT_AMBULATORY_CARE_PROVIDER_SITE_OTHER): Payer: Medicare Other | Admitting: Family

## 2018-07-06 ENCOUNTER — Ambulatory Visit (INDEPENDENT_AMBULATORY_CARE_PROVIDER_SITE_OTHER): Payer: Medicare Other

## 2018-07-06 ENCOUNTER — Ambulatory Visit (INDEPENDENT_AMBULATORY_CARE_PROVIDER_SITE_OTHER): Payer: Medicare Other | Admitting: Podiatry

## 2018-07-06 ENCOUNTER — Encounter: Payer: Self-pay | Admitting: Family

## 2018-07-06 VITALS — BP 128/78 | HR 63 | Temp 97.7°F | Ht 64.5 in | Wt 203.6 lb

## 2018-07-06 DIAGNOSIS — M542 Cervicalgia: Secondary | ICD-10-CM | POA: Diagnosis not present

## 2018-07-06 DIAGNOSIS — M898X7 Other specified disorders of bone, ankle and foot: Secondary | ICD-10-CM

## 2018-07-06 DIAGNOSIS — G8929 Other chronic pain: Secondary | ICD-10-CM

## 2018-07-06 DIAGNOSIS — M25511 Pain in right shoulder: Secondary | ICD-10-CM

## 2018-07-06 DIAGNOSIS — L600 Ingrowing nail: Secondary | ICD-10-CM | POA: Diagnosis not present

## 2018-07-06 DIAGNOSIS — Z9889 Other specified postprocedural states: Secondary | ICD-10-CM

## 2018-07-06 LAB — BASIC METABOLIC PANEL
BUN / CREAT RATIO: 20 (ref 12–28)
BUN: 23 mg/dL (ref 8–27)
CHLORIDE: 103 mmol/L (ref 96–106)
CO2: 22 mmol/L (ref 20–29)
Calcium: 10.1 mg/dL (ref 8.7–10.3)
Creatinine, Ser: 1.15 mg/dL — ABNORMAL HIGH (ref 0.57–1.00)
GFR calc non Af Amer: 47 mL/min/{1.73_m2} — ABNORMAL LOW (ref >59)
GFR, EST AFRICAN AMERICAN: 54 mL/min/{1.73_m2} — AB (ref >59)
Glucose: 81 mg/dL (ref 65–99)
POTASSIUM: 4.6 mmol/L (ref 3.5–5.2)
Sodium: 142 mmol/L (ref 134–144)

## 2018-07-06 NOTE — Patient Instructions (Addendum)
You do not need a referral:  Please call [phone]; [facility]

## 2018-07-06 NOTE — Progress Notes (Signed)
[name] is a 73 y.o. female with the following history as recorded in EpicCare:  Patient Active Problem List   Diagnosis Date Noted  . Dyspnea 02/16/2018  . 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 UTI (urinary tract infection) 11/27/2014  . UTI (lower urinary tract infection) 11/27/2014  . Acute cystitis with hematuria 11/13/2014  . Hemorrhoids, external 11/04/2014  . Wellness examination 10/03/2014  . Pain in joint, shoulder region 09/24/2014  . Cramp in limb 06/26/2014  . Rectal bleeding 05/02/2014  . External hemorrhoids 05/02/2014  . Hemorrhoids without complication 24/40/1027  . Pain in joint, lower leg 02/01/2014  . Vitamin D deficiency 01/16/2014  . Gastroparesis 09/14/2013  . Other dysphagia 09/04/2013  . Nausea alone 09/04/2013  . Iron deficiency anemia 08/28/2013  . Hypomagnesemia 05/11/2013  . Hypokalemia 05/11/2013  . Generalized weakness 05/11/2013  . Dehydration 11/25/2011  . Fatigue 11/16/2011  . Breast cancer ([place]) 09/29/2011  . Family history of breast cancer 08/19/2011  . Primary cancer of lower-inner quadrant of left female breast ([place]) 08/17/2011  . Neck pain 07/29/2011  . Routine general medical examination at a health care facility 06/28/2011  . Edema 11/26/2010  . CLOSTRIDIUM DIFFICILE COLITIS 02/07/2010  . Blind loop syndrome 10/01/2009  . Abdominal pain 10/01/2009  . Vitamin B 12 deficiency 08/30/2009  . 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 Rash and nonspecific skin eruption 08/13/2009  . GOITER, MULTINODULAR 04/02/2009  . CONTACT DERMATITIS&OTHER ECZEMA DUE UNSPEC CAUSE 02/20/2009  . URINARY CALCULUS 10/11/2008  . Asymptomatic menopausal state 10/11/2008  . BACK PAIN, CHRONIC 07/03/2008  . Esophageal reflux 06/12/2008  . Diarrhea 06/12/2008  . ABDOMINAL PAIN-RUQ 05/17/2008  . Diabetes ([place]) 12/10/2006  . Dyslipidemia 12/10/2006  . Gout 12/10/2006  . HYPERTENSION 12/10/2006  . DIVERTICULOSIS,  [name] 12/10/2006  . OSTEOARTHRITIS 12/10/2006  . ESOPHAGEAL STRICTURE 08/23/2002  . HIATAL HERNIA 08/23/2002  . INTERNAL HEMORRHOIDS 02/02/2001    Current Outpatient Medications  Medication Sig Dispense Refill  . allopurinol (ZYLOPRIM) 100 MG tablet Take 1 tablet (100 mg total) by mouth daily. 90 tablet 3  . aMILoride (MIDAMOR) 5 MG tablet Take 1 tablet (5 mg total) by mouth 2 (two) times daily. 180 tablet 3  . busPIRone (BUSPAR) 15 MG tablet Take 1 tablet (15 mg total) by mouth 2 (two) times daily. 90 tablet 3  . calcium carbonate (TUMS - DOSED IN MG ELEMENTAL CALCIUM) 500 MG chewable tablet Chew 1 tablet by mouth as needed for indigestion or heartburn.    . Calcium Carbonate-Vitamin D (CALCIUM-VITAMIN D) 600-200 MG-UNIT CAPS Take 1 capsule by mouth daily.      . cholestyramine (QUESTRAN) 4 g packet Take 1 packet in juice or water by mouth twice daily. 180 each 1  . Cyanocobalamin (VITAMIN B 12 PO) Take 1 tablet by mouth 2 (two) times daily.     Marland Kitchen dicyclomine (BENTYL) 10 MG capsule TAKE 1 CAPSULE (10 MG TOTAL) BY MOUTH 4 (FOUR) TIMES DAILY. 120 capsule 6  . diphenoxylate-atropine (LOMOTIL) 2.5-0.025 MG tablet Take 1 tablet by mouth 3 times daily as needed for diarrhea. 100 tablet 2  . famotidine (PEPCID) 40 MG tablet Take 1 tablet (40 mg total) by mouth at bedtime. 90 tablet 1  . gabapentin (NEURONTIN) 300 MG capsule Take 2 capsules (600 mg total) by mouth 2 (two) times daily. 360 capsule 3  . HYDROcodone-acetaminophen (NORCO) 10-325 MG tablet Take 1 tablet by mouth every 6 (six) hours as needed. 30 tablet 0  .  levothyroxine (SYNTHROID, LEVOTHROID) 200 MCG tablet Take 1 tablet (200 mcg total) by mouth daily before breakfast. 90 tablet 3  . Lidocaine, Anorectal, 5 % GEL Apply a pea-sized amount to the affected area 3 times daily. 30 g 0  . lovastatin (MEVACOR) 20 MG tablet TAKE 1 TABLET BY MOUTH  DAILY AT 6 PM. 90 tablet 2  . Magnesium Chloride 64 MG TBEC Take 64 mg by mouth daily.    .  methocarbamol (ROBAXIN) 500 MG tablet Take 1 tablet (500 mg total) by mouth every 8 (eight) hours as needed for muscle spasms. 30 tablet 0  . metoprolol tartrate (LOPRESSOR) 50 MG tablet Take 1 tablet (50mg ) 2 hours prior to your Cardiac CT 1 tablet 0  . ondansetron (ZOFRAN-ODT) 4 MG disintegrating tablet Take 1 tablet (4 mg total) by mouth every 8 (eight) hours as needed. for nausea 90 tablet 0  . pantoprazole (PROTONIX) 40 MG tablet TAKE 1 TABLET BY MOUTH 30 MINUTES PRIOR TO BREAKFAST AND SUPPER 60 tablet 11  . predniSONE (DELTASONE) 50 MG tablet Take 1 tab (50mg ) 13 hours prior to test,Take 1 tab (50mg ) 7 hours prior to test, Take 1 tab (50mg ) 1 hours prior to test 3 tablet 0  . rifaximin (XIFAXAN) 550 MG TABS tablet Take 1 tablet by mouth 3 times daily for 14 days. 42 tablet 0   No current facility-administered medications for this visit.    Facility-Administered Medications Ordered in Other Visits  Medication Dose Route Frequency Provider Last Rate Last Dose  . acetaminophen (TYLENOL) tablet 1,000 mg  1,000 mg Oral Once Amada Kingfisher, MD        Allergies: Aspirin; Iodinated diagnostic agents; Trazodone and nefazodone; Flagyl [metronidazole]; Iodine; and Morphine and related  Past Medical History:  Diagnosis Date  . ANEMIA, IRON DEFICIENCY 05/08/2009  . Angina   . ASYMPTOMATIC POSTMENOPAUSAL STATUS 10/11/2008  . Blood transfusion   . Blood transfusion without reported diagnosis   . Breast cancer ([place]) 09/29/11   invasive grade III ductal ca,assoc high grade dcis,ER/PR=neg  . C. difficile colitis   . Diverticulosis of [name] (without mention of hemorrhage)   . Esophageal reflux 06/12/2008  . Gastroparesis   . GOITER, MULTINODULAR 04/02/2009  . Gout, unspecified   . H/O hiatal hernia   . History of lower GI bleeding   . History of radiation therapy 02/08/12-03/25/12   left breast,total 61gy  . Hypokalemia 05/11/2013  . Hypomagnesemia   . HYPOTHYROIDISM, POST-RADIATION 08/13/2009  .  Internal hemorrhoids without mention of complication   . Kidney stones    "several"  . Leukopenia   . Migraines   . Obesity   . Osteoarthrosis, unspecified whether generalized or localized, unspecified site   . Other and unspecified hyperlipidemia   . Personal history of chemotherapy 2013  . Personal history of radiation therapy 2013   left  . PONV (postoperative nausea and vomiting)   . PUD (peptic ulcer disease)   . Short bowel syndrome   . Shortness of breath on exertion    "sometimes"  . Stricture and stenosis of esophagus   . Thyrotoxicosis without mention of goiter or other cause, without mention of thyrotoxic crisis or storm   . Type II or unspecified type diabetes mellitus without mention of complication, not stated as uncontrolled    no med in years diet controled  . Unspecified essential hypertension   . UTI (urinary tract infection)   . Varicose veins   . VITAMIN B12 DEFICIENCY 08/30/2009  Past Surgical History:  Procedure Laterality Date  . ABDOMINAL ADHESION SURGERY  1980's thru 1990's   "several"  . ABDOMINAL HYSTERECTOMY  1970's   with BSO  . BREAST BIOPSY Left 08/13/11   left breast lower inner quadrant  . BREAST BIOPSY Right 1985   Rt exc bx, benign  . BREAST LUMPECTOMY Left 08/2011  . BREAST LUMPECTOMY W/ NEEDLE LOCALIZATION  09/29/11   left  breast=lymph node,excision benign/ ER/PR=neg, her 2 Positive  . BUNIONECTOMY  1970's   bilateral  . CHOLECYSTECTOMY  1990's  . [name] SURGERY     "several surgeries for short bowel syndrome"  . COLONOSCOPY  2012   multiple   . DILATION AND CURETTAGE OF UTERUS    . ESOPHAGOGASTRODUODENOSCOPY  2011   multiple   . EXCISIONAL HEMORRHOIDECTOMY  11/10/2016  . EYE SURGERY     "long time ago"  . FLEXIBLE SIGMOIDOSCOPY  2011   multiple   . KIDNEY STONE SURGERY  1990's   "tried to go up & get it but pushed it further up"  . LITHOTRIPSY     "4 or 5 times"  . MASTECTOMY W/ NODES PARTIAL  09/29/11   left  . PORT-A-CATH  REMOVAL Right 12/19/2013   Procedure: MINOR REMOVAL PORT-A-CATH;  Surgeon: Adin Hector, MD;  Location: [place];  Service: General;  Laterality: Right;  . PORTACATH PLACEMENT  09/29/2011   Procedure: INSERTION PORT-A-CATH;  Surgeon: Adin Hector, MD;  Location: [place];  Service: General;  Laterality: N/A;  . Thyroid Ultrasound  12/1994 and 12/1995  . TOTAL KNEE ARTHROPLASTY Right 06/05/2015   Procedure: RIGHT TOTAL KNEE ARTHROPLASTY;  Surgeon: Ninetta Lights, MD;  Location: [name];  Service: Orthopedics;  Laterality: Right;  . VEIN LIGATION AND STRIPPING  1980's   Right leg    Family History  Problem Relation Age of Onset  . Esophageal cancer Son        deceased  . Diabetes Mother   . Heart disease Mother   . Kidney disease Sister   . Diabetes Father   . Hypertension Father   . Kidney disease Brother        x 3  . [name] cancer Paternal Uncle   . Breast cancer Maternal Aunt   . Breast cancer Paternal Aunt   . Breast cancer Cousin        Pt states she has 15+ cousins w/ Breast CA    Social History   Tobacco Use  . Smoking status: Former Smoker    Packs/day: 1.00    Years: 10.00    Pack years: 10.00    Types: Cigarettes    Last attempt to quit: 09/22/1985    Years since quitting: 32.8  . Smokeless tobacco: Never Used  Substance Use Topics  . Alcohol use: No    Comment: 09/29/11 "used to drink socially years ago"    Subjective:  Patient has chronic history of right-sided neck/ right shoulder pain/ symptoms with this flare have lasted for the past 3 weeks- has already seen her orthopedist about these concerns [facility]); they told her there was arthritis in the shoulder; she is concerned that the pain in her right shoulder is due to the size/ weight of her right breast; is requesting referral to a plastic surgeon;    Objective:  Vitals:   07/06/18 1306  BP: 128/78  Pulse: 63  Temp: 97.7 F (36.5 C)  TempSrc: Oral  SpO2: 93%  Weight:  203 lb 9.6 oz (92.4 kg)  Height: 5' 4.5" (1.638 m)    General: Well developed, well nourished, in no acute distress  Skin : Warm and dry.  Head: Normocephalic and atraumatic  Lungs: Respirations unlabored;  Musculoskeletal: No deformities; no active joint inflammation  Extremities: No edema, cyanosis, clubbing  Vessels: Symmetric bilaterally  Neurologic: Alert and oriented; speech intact; face symmetrical; moves all extremities well; CNII-XII intact without focal deficit   Assessment:  1. Acute pain of right shoulder   2. Chronic neck pain     Plan:  Patient is adamant that the weight of her right breast is causing her symptoms; have given her information to see plastic surgeon to see if reduction is appropriate; Am still concerned that arthritis is causing symptoms- since she has been working with orthopedics for these issues, have asked her to follow-up there for possible injection/ further treatment.  No follow-ups on file.  No orders of the defined types were placed in this encounter.   Requested Prescriptions    No prescriptions requested or ordered in this encounter

## 2018-07-08 ENCOUNTER — Telehealth (HOSPITAL_COMMUNITY): Payer: Self-pay | Admitting: Emergency Medicine

## 2018-07-08 NOTE — Telephone Encounter (Signed)
Reaching out to patient to offer assistance regarding upcoming cardiac imaging study; pt verbalizes understanding of appt date/time, parking situation and where to check in, pre-test NPO status and medications ordered, and verified current allergies; name and call back number provided for further questions should they arise Marchia Bond RN Navigator Cardiac Imaging [facility] [phone] office [phone] cell  Reviewed prednisone schedule: 1st dose (9:30p) 2nd dose ( 3:30a) 3rd dose (9:30a) + benadryl NEEDS RIDE  Will take metoprolol at 8:30a

## 2018-07-10 NOTE — Progress Notes (Signed)
Subjective: 73 year old female presenting today for follow up evaluation status post bilateral subungual exostectomies. DOS: 05/27/18. She states her left hallux is healing well. She reports some continued bleeding from the right hallux. She has not done anything for treatment. There are no modifying factors noted. Patient is here for further evaluation and treatment.   Past Medical History:  Diagnosis Date  . ANEMIA, IRON DEFICIENCY 05/08/2009  . Angina   . ASYMPTOMATIC POSTMENOPAUSAL STATUS 10/11/2008  . Blood transfusion   . Blood transfusion without reported diagnosis   . Breast cancer ([place]) 09/29/11   invasive grade III ductal ca,assoc high grade dcis,ER/PR=neg  . C. difficile colitis   . Diverticulosis of colon (without mention of hemorrhage)   . Esophageal reflux 06/12/2008  . Gastroparesis   . GOITER, MULTINODULAR 04/02/2009  . Gout, unspecified   . H/O hiatal hernia   . History of lower GI bleeding   . History of radiation therapy 02/08/12-03/25/12   left breast,total 61gy  . Hypokalemia 05/11/2013  . Hypomagnesemia   . HYPOTHYROIDISM, POST-RADIATION 08/13/2009  . Internal hemorrhoids without mention of complication   . Kidney stones    "several"  . Leukopenia   . Migraines   . Obesity   . Osteoarthrosis, unspecified whether generalized or localized, unspecified site   . Other and unspecified hyperlipidemia   . Personal history of chemotherapy 2013  . Personal history of radiation therapy 2013   left  . PONV (postoperative nausea and vomiting)   . PUD (peptic ulcer disease)   . Short bowel syndrome   . Shortness of breath on exertion    "sometimes"  . Stricture and stenosis of esophagus   . Thyrotoxicosis without mention of goiter or other cause, without mention of thyrotoxic crisis or storm   . Type II or unspecified type diabetes mellitus without mention of complication, not stated as uncontrolled    no med in years diet controled  . Unspecified essential  hypertension   . UTI (urinary tract infection)   . Varicose veins   . VITAMIN B12 DEFICIENCY 08/30/2009    Objective:  General: Well developed, nourished, in no acute distress, alert and oriented x3   Dermatology: Skin is warm, dry and supple bilateral. Lateral border of the right hallux appears to be erythematous with evidence of an ingrowing nail. Pain on palpation noted to the border of the nail fold. The remaining nails appear unremarkable at this time. There are no open sores, lesions.  Vascular: Dorsalis Pedis artery and Posterior Tibial artery pedal pulses palpable. No lower extremity edema noted.   Neruologic: Grossly intact via light touch bilateral.  Musculoskeletal: Muscular strength within normal limits in all groups bilateral. Normal range of motion noted to all pedal and ankle joints.   Radiographic Exam:  Normal osseous mineralization. Joint spaces preserved. No fracture/dislocation/boney destruction.    Assesement: #1 Recurrent ingrowing nail lateral border right hallux  #2 s/p bilateral subungual exostectomies. DOS: 05/27/18 - healed   Plan of Care:  1. Patient evaluated. X-Rays reviewed.  2. Discussed treatment alternatives and plan of care. Explained nail avulsion procedure and post procedure course to patient. 3. Patient opted for temporary partial nail avulsion of the lateral border of the right hallux.  4. Prior to procedure, local anesthesia infiltration utilized using 3 ml of a 50:50 mixture of 2% plain lidocaine and 0.5% plain marcaine in a normal hallux block fashion and a betadine prep performed.  5. Light dressing applied. 6. Recommended good shoe gear.  7. May resume full activity with no restrictions.  8. Return to clinic as needed.   Edrick Kins, DPM [facility]  Dr. Edrick Kins, [address]                                        [place], [place] [address]                Office [phone]  Fax [phone]

## 2018-07-11 ENCOUNTER — Ambulatory Visit (HOSPITAL_COMMUNITY)
Admission: RE | Admit: 2018-07-11 | Discharge: 2018-07-11 | Disposition: A | Discharge status: Home / Self Care | Payer: Medicare Other | Source: Ambulatory Visit | Attending: Internal Medicine | Admitting: Internal Medicine

## 2018-07-11 ENCOUNTER — Ambulatory Visit (HOSPITAL_COMMUNITY): Admission: RE | Admit: 2018-07-11 | Payer: Medicare Other | Source: Ambulatory Visit

## 2018-07-11 ENCOUNTER — Ambulatory Visit: Payer: Medicare Other | Admitting: Internal Medicine

## 2018-07-11 ENCOUNTER — Encounter (HOSPITAL_COMMUNITY): Payer: Self-pay

## 2018-07-11 DIAGNOSIS — R072 Precordial pain: Secondary | ICD-10-CM | POA: Insufficient documentation

## 2018-07-11 MED ORDER — NITROGLYCERIN 0.4 MG SL SUBL
SUBLINGUAL_TABLET | SUBLINGUAL | Status: AC
  Filled 2018-07-11: qty 2

## 2018-07-11 MED ORDER — IOPAMIDOL (ISOVUE-370) INJECTION 76%
80.0000 mL | Freq: Once | INTRAVENOUS | Status: AC | PRN
  Administered 2018-07-11: 100 mL via INTRAVENOUS

## 2018-07-11 MED ORDER — NITROGLYCERIN 0.4 MG SL SUBL
0.8000 mg | SUBLINGUAL_TABLET | Freq: Once | SUBLINGUAL | Status: AC
  Administered 2018-07-11: 0.8 mg via SUBLINGUAL
  Filled 2018-07-11: qty 25

## 2018-07-21 ENCOUNTER — Telehealth: Payer: Self-pay

## 2018-07-21 NOTE — Telephone Encounter (Signed)
Patient made aware of her Cor ST results and Dr.Acharya's recommendation. F/u appt with Dr.Acharya scheduled for 10/10/18 @ 9:20am. Pt sts that she is agreeable with starting Crestor, she is currently taking Lovastatin 20mg  daily and tolerates it well. Adv pt that I will fwd the message to Dr.Acharya to conform that her intent is to switch therapy from Lovastatin to Rosuvastatin.   Pt sts that starting after her CT she has been having occasional palpitations. The palpitations are short in duration and are not associated with any symptoms. She has been drinking several cups of lipton tea daily in place of coffee. Adv pt that lipton tea is not necessarily a better substitution since the tea contains a good amount of caffeine. Adv pt to limit or eliminate her caffeine intake, and try to stay hydrated.  Adv pt that I will fwd the message to Dr.Acharya and call back with her response. Pt agreeable with the plan and voiced appreciation for the call.

## 2018-07-21 NOTE — Telephone Encounter (Signed)
I did not initially see that she is already on a statin. No reason to change from lovastatin. She can remain on current therapy.   Agree with recommendation to limit caffeine intake. If palpitations continue after 3-4 weeks, we can place a monitor to evaluate further.   Would be happy to see her sooner if symptoms are bothersome.

## 2018-07-21 NOTE — Telephone Encounter (Signed)
-----   Message from Elouise Munroe, MD sent at 07/21/2018  3:29 PM EST ----- Mild coronary artery disease. We can start a statin at rosuvastatin 5 mg daily and if she tolerates we can uptitrate.  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 We will recheck this in a year to make sure it's not changing.  I'd like to see her back in May and we can determine next steps.

## 2018-07-25 NOTE — Telephone Encounter (Signed)
Called pt to update her on Dr.Achraya's response below. Pt sts that she has cut back on her caffeine intake and she notices a decrease in palpitations. Adv pt to f/u with Dr.A as planned in May 2020. Pt is to contact the office sooner if needed. Pt verbalized understanding and voiced appreciation for the call.

## 2018-08-11 ENCOUNTER — Telehealth: Payer: Self-pay | Admitting: *Deleted

## 2018-08-11 NOTE — Telephone Encounter (Signed)
Patient walked in office today complaining of chest pains/pressure since last night. Explained to patient office protocol was to call EMS for her however she refused. Did have her sign AMA form and had her schedule appointment.

## 2018-08-15 ENCOUNTER — Ambulatory Visit (INDEPENDENT_AMBULATORY_CARE_PROVIDER_SITE_OTHER): Payer: Medicare Other | Admitting: Internal Medicine

## 2018-08-15 ENCOUNTER — Other Ambulatory Visit: Payer: Self-pay

## 2018-08-15 ENCOUNTER — Encounter: Payer: Self-pay | Admitting: Internal Medicine

## 2018-08-15 VITALS — BP 114/84 | HR 63 | Ht 65.0 in | Wt 207.2 lb

## 2018-08-15 DIAGNOSIS — R079 Chest pain, unspecified: Secondary | ICD-10-CM

## 2018-08-15 DIAGNOSIS — R252 Cramp and spasm: Secondary | ICD-10-CM | POA: Diagnosis not present

## 2018-08-15 DIAGNOSIS — Q2544 Congenital dilation of aorta: Secondary | ICD-10-CM | POA: Diagnosis not present

## 2018-08-15 DIAGNOSIS — Q2549 Other congenital malformations of aorta: Secondary | ICD-10-CM

## 2018-08-15 MED ORDER — NITROGLYCERIN 0.4 MG SL SUBL: 2.0000 mg | SUBLINGUAL_TABLET | SUBLINGUAL | 2 refills | Status: DC | PRN

## 2018-08-15 NOTE — Patient Instructions (Signed)
Medication Instructions:  Dr.Acharya has recommended you make the following change in your medication:   Use sublingual Nitro-glycerin as needed as directed for chest pain. An Rx has been sent to your mail order pharmacy  If you need a refill on your cardiac medications before your next appointment, please call your pharmacy.   Lab work: None ordered   Testing/Procedures: Dr.Acharya has requested that you have a lower extremity arterial  duplex. During this test, exercise and ultrasound are used to evaluate arterial blood flow in the legs. Allow one hour for this exam. There are no restrictions or special instructions.  Dr.Acharya has requested that you have an ankle brachial index (ABI). During this test an ultrasound and blood pressure cuff are used to evaluate the arteries that supply the arms and legs with blood. Allow thirty minutes for this exam. There are no restrictions or special instructions.    Follow-Up: At [facility], you and your health needs are our priority.  As part of our continuing mission to provide you with exceptional heart care, we have created designated Provider Care Teams.  These Care Teams include your primary Cardiologist (physician) and Advanced Practice Providers (APPs -  Physician Assistants and Nurse Practitioners) who all work together to provide you with the care you need, when you need it. You will need a follow up appointment in 3 months.   You may see  Dr.Acharya or one of the following Advanced Practice Providers on your designated Care Team:   Rosaria Ferries, PA-C . Jory Sims, DNP, ANP  Any Other Special Instructions Will Be Listed Below (If Applicable). Nitroglycerin sublingual tablets What is this medicine? NITROGLYCERIN (nye troe GLI ser in) is a type of vasodilator. It relaxes blood vessels, increasing the blood and oxygen supply to your heart. This medicine is used to relieve chest pain caused by angina. It is also used to prevent  chest pain before activities like climbing stairs, going outdoors in cold weather, or sexual activity. This medicine may be used for other purposes; ask your health care provider or pharmacist if you have questions. COMMON BRAND NAME(S): Nitroquick, Nitrostat, Nitrotab What should I tell my health care provider before I take this medicine? They need to know if you have any of these conditions: -anemia -head injury, recent stroke, or bleeding in the brain -liver disease -previous heart attack -an unusual or allergic reaction to nitroglycerin, other medicines, foods, dyes, or preservatives -pregnant or trying to get pregnant -breast-feeding How should I use this medicine? Take this medicine by mouth as needed. At the first sign of an angina attack (chest pain or tightness) place one tablet under your tongue. You can also take this medicine 5 to 10 minutes before an event likely to produce chest pain. Follow the directions on the prescription label. Let the tablet dissolve under the tongue. Do not swallow whole. Replace the dose if you accidentally swallow it. It will help if your mouth is not dry. Saliva around the tablet will help it to dissolve more quickly. Do not eat or drink, smoke or chew tobacco while a tablet is dissolving. If you are not better within 5 minutes after taking ONE dose of nitroglycerin, call 9-1-1 immediately to seek emergency medical care. Do not take more than 3 nitroglycerin tablets over 15 minutes. If you take this medicine often to relieve symptoms of angina, your doctor or health care professional may provide you with different instructions to manage your symptoms. If symptoms do not go away after  following these instructions, it is important to call 9-1-1 immediately. Do not take more than 3 nitroglycerin tablets over 15 minutes. Talk to your pediatrician regarding the use of this medicine in children. Special care may be needed. Overdosage: If you think you have taken too  much of this medicine contact a poison control center or emergency room at once. NOTE: This medicine is only for you. Do not share this medicine with others. What if I miss a dose? This does not apply. This medicine is only used as needed. What may interact with this medicine? Do not take this medicine with any of the following medications: -certain migraine medicines like ergotamine and dihydroergotamine (DHE) -medicines used to treat erectile dysfunction like sildenafil, tadalafil, and vardenafil -riociguat This medicine may also interact with the following medications: -alteplase -aspirin -heparin -medicines for high blood pressure -medicines for mental depression -other medicines used to treat angina -phenothiazines like chlorpromazine, mesoridazine, prochlorperazine, thioridazine This list may not describe all possible interactions. Give your health care provider a list of all the medicines, herbs, non-prescription drugs, or dietary supplements you use. Also tell them if you smoke, drink alcohol, or use illegal drugs. Some items may interact with your medicine. What should I watch for while using this medicine? Tell your doctor or health care professional if you feel your medicine is no longer working. Keep this medicine with you at all times. Sit or lie down when you take your medicine to prevent falling if you feel dizzy or faint after using it. Try to remain calm. This will help you to feel better faster. If you feel dizzy, take several deep breaths and lie down with your feet propped up, or bend forward with your head resting between your knees. You may get drowsy or dizzy. Do not drive, use machinery, or do anything that needs mental alertness until you know how this drug affects you. Do not stand or sit up quickly, especially if you are an older patient. This reduces the risk of dizzy or fainting spells. Alcohol can make you more drowsy and dizzy. Avoid alcoholic drinks. Do not treat  yourself for coughs, colds, or pain while you are taking this medicine without asking your doctor or health care professional for advice. Some ingredients may increase your blood pressure. What side effects may I notice from receiving this medicine? Side effects that you should report to your doctor or health care professional as soon as possible: -blurred vision -dry mouth -skin rash -sweating -the feeling of extreme pressure in the head -unusually weak or tired Side effects that usually do not require medical attention (report to your doctor or health care professional if they continue or are bothersome): -flushing of the face or neck -headache -irregular heartbeat, palpitations -nausea, vomiting This list may not describe all possible side effects. Call your doctor for medical advice about side effects. You may report side effects to FDA at 1-800-FDA-1088. Where should I keep my medicine? Keep out of the reach of children. Store at room temperature between 20 and 25 degrees C (68 and 77 degrees F). Store in [employer]. Protect from light and moisture. Keep tightly closed. Throw away any unused medicine after the expiration date. NOTE: This sheet is a summary. It may not cover all possible information. If you have questions about this medicine, talk to your doctor, pharmacist, or health care provider.  2019 [employer] (2013-03-16 17:57:36)

## 2018-08-17 ENCOUNTER — Other Ambulatory Visit: Payer: Self-pay

## 2018-08-17 ENCOUNTER — Ambulatory Visit (HOSPITAL_COMMUNITY)
Admission: RE | Admit: 2018-08-17 | Discharge: 2018-08-17 | Disposition: A | Discharge status: Home / Self Care | Payer: Medicare Other | Source: Ambulatory Visit | Attending: Internal Medicine | Admitting: Internal Medicine

## 2018-08-17 DIAGNOSIS — R252 Cramp and spasm: Secondary | ICD-10-CM | POA: Insufficient documentation

## 2018-09-05 ENCOUNTER — Other Ambulatory Visit: Payer: Self-pay | Admitting: Family

## 2018-09-05 DIAGNOSIS — Z1231 Encounter for screening mammogram for malignant neoplasm of breast: Secondary | ICD-10-CM

## 2018-10-03 NOTE — Progress Notes (Signed)
Cardiology Office Note:    Date:  3/16//2020   ID:  [name], DOB [birth year], MRN [id number]  PCP:  Marrian Salvage, [place]  Cardiologist:  No primary care provider on file.  Electrophysiologist:  None   Referring MD: Marrian Salvage,*   Chief Complaint: follow up after testing  History of Present Illness:    [name] is a 73 y.o. female with a hx of left breast cancer with chemotherapy and radiation, GERD, hypothyroidism, and diabetes mellitus type 2 diet controlled.  She presents for follow up from our initial visit in November. For chest pain we performed a CT coronary angiogram with finding of mild CAD. 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   She presented to the office with chest pain several days ago, but on discussions today she feels this is better. She notes the PAD poster on the exam room door and feels that she has some of these symptoms. She denies true claudication, but notes pain and cramping in the bilateral legs. She requests screening.     The patient denies dyspnea at rest or with exertion, palpitations, PND, orthopnea, or leg swelling. Denies syncope or presyncope. Denies dizziness or lightheadedness. Denies snoring and has not been evaluated for sleep apnea.  Past Medical History:  Diagnosis Date   ANEMIA, IRON DEFICIENCY 05/08/2009   Angina    ASYMPTOMATIC POSTMENOPAUSAL STATUS 10/11/2008   Blood transfusion    Blood transfusion without reported diagnosis    Breast cancer ([place]) 09/29/11   invasive grade III ductal ca,assoc high grade dcis,ER/PR=neg   C. difficile colitis    Diverticulosis of colon (without mention of hemorrhage)    Esophageal reflux 06/12/2008   Gastroparesis    GOITER, MULTINODULAR 04/02/2009   Gout, unspecified    H/O hiatal hernia    History of lower GI bleeding    History of radiation therapy 02/08/12-03/25/12   left breast,total 61gy   Hypokalemia 05/11/2013     Hypomagnesemia    HYPOTHYROIDISM, POST-RADIATION 08/13/2009   Internal hemorrhoids without mention of complication    Kidney stones    "several"   Leukopenia    Migraines    Obesity    Osteoarthrosis, unspecified whether generalized or localized, unspecified site    Other and unspecified hyperlipidemia    Personal history of chemotherapy 2013   Personal history of radiation therapy 2013   left   PONV (postoperative nausea and vomiting)    PUD (peptic ulcer disease)    Short bowel syndrome    Shortness of breath on exertion    "sometimes"   Stricture and stenosis of esophagus    Thyrotoxicosis without mention of goiter or other cause, without mention of thyrotoxic crisis or storm    Type II or unspecified type diabetes mellitus without mention of complication, not stated as uncontrolled    no med in years diet controled   Unspecified essential hypertension    UTI (urinary tract infection)    Varicose veins    VITAMIN B12 DEFICIENCY 08/30/2009    Past Surgical History:  Procedure Laterality Date   ABDOMINAL ADHESION SURGERY  1980's thru 1990's   "several"   ABDOMINAL HYSTERECTOMY  1970's   with BSO   BREAST BIOPSY Left 08/13/11   left breast lower inner quadrant   BREAST BIOPSY Right 1985   Rt exc bx, benign   BREAST LUMPECTOMY Left 08/2011   BREAST LUMPECTOMY W/ NEEDLE LOCALIZATION  09/29/11   left  breast=lymph node,excision benign/ ER/PR=neg, her 2 Positive   BUNIONECTOMY  1970's   bilateral   CHOLECYSTECTOMY  1990's   COLON SURGERY     "several surgeries for short bowel syndrome"   COLONOSCOPY  2012   multiple    DILATION AND CURETTAGE OF UTERUS     ESOPHAGOGASTRODUODENOSCOPY  2011   multiple    EXCISIONAL HEMORRHOIDECTOMY  11/10/2016   EYE SURGERY     "long time ago"   FLEXIBLE SIGMOIDOSCOPY  2011   multiple    KIDNEY STONE SURGERY  1990's   "tried to go up & get it but pushed it further up"   LITHOTRIPSY     "4 or 5  times"   MASTECTOMY W/ NODES PARTIAL  09/29/11   left   PORT-A-CATH REMOVAL Right 12/19/2013   Procedure: MINOR REMOVAL PORT-A-CATH;  Surgeon: Adin Hector, MD;  Location: [facility];  Service: General;  Laterality: Right;   PORTACATH PLACEMENT  09/29/2011   Procedure: INSERTION PORT-A-CATH;  Surgeon: Adin Hector, MD;  Location: [facility];  Service: General;  Laterality: N/A;   Thyroid Ultrasound  12/1994 and 12/1995   TOTAL KNEE ARTHROPLASTY Right 06/05/2015   Procedure: RIGHT TOTAL KNEE ARTHROPLASTY;  Surgeon: Ninetta Lights, MD;  Location: [facility];  Service: Orthopedics;  Laterality: Right;   VEIN LIGATION AND STRIPPING  1980's   Right leg    Current Medications: Current Meds  Medication Sig   allopurinol (ZYLOPRIM) 100 MG tablet Take 1 tablet (100 mg total) by mouth daily.   aMILoride (MIDAMOR) 5 MG tablet Take 1 tablet (5 mg total) by mouth 2 (two) times daily.   busPIRone (BUSPAR) 15 MG tablet Take 1 tablet (15 mg total) by mouth 2 (two) times daily.   calcium carbonate (TUMS - DOSED IN MG ELEMENTAL CALCIUM) 500 MG chewable tablet Chew 1 tablet by mouth as needed for indigestion or heartburn.   Calcium Carbonate-Vitamin D (CALCIUM-VITAMIN D) 600-200 MG-UNIT CAPS Take 1 capsule by mouth daily.     cholestyramine (QUESTRAN) 4 g packet Take 1 packet in juice or water by mouth twice daily.   Cyanocobalamin (VITAMIN B 12 PO) Take 1 tablet by mouth 2 (two) times daily.    dicyclomine (BENTYL) 10 MG capsule TAKE 1 CAPSULE (10 MG TOTAL) BY MOUTH 4 (FOUR) TIMES DAILY.   diphenoxylate-atropine (LOMOTIL) 2.5-0.025 MG tablet Take 1 tablet by mouth 3 times daily as needed for diarrhea.   famotidine (PEPCID) 40 MG tablet Take 1 tablet (40 mg total) by mouth at bedtime.   gabapentin (NEURONTIN) 300 MG capsule Take 2 capsules (600 mg total) by mouth 2 (two) times daily.   HYDROcodone-acetaminophen (NORCO) 10-325 MG tablet Take 1 tablet by mouth every 6 (six) hours as  needed.   levothyroxine (SYNTHROID, LEVOTHROID) 200 MCG tablet Take 1 tablet (200 mcg total) by mouth daily before breakfast.   Lidocaine, Anorectal, 5 % GEL Apply a pea-sized amount to the affected area 3 times daily.   lovastatin (MEVACOR) 20 MG tablet TAKE 1 TABLET BY MOUTH  DAILY AT 6 PM.   Magnesium Chloride 64 MG TBEC Take 64 mg by mouth daily.   methocarbamol (ROBAXIN) 500 MG tablet Take 1 tablet (500 mg total) by mouth every 8 (eight) hours as needed for muscle spasms.   metoprolol tartrate (LOPRESSOR) 50 MG tablet Take 1 tablet (50mg ) 2 hours prior to your Cardiac CT   ondansetron (ZOFRAN-ODT) 4 MG disintegrating tablet Take 1 tablet (4 mg total) by  mouth every 8 (eight) hours as needed. for nausea   pantoprazole (PROTONIX) 40 MG tablet TAKE 1 TABLET BY MOUTH 30 MINUTES PRIOR TO BREAKFAST AND SUPPER   predniSONE (DELTASONE) 50 MG tablet Take 1 tab (50mg ) 13 hours prior to test,Take 1 tab (50mg ) 7 hours prior to test, Take 1 tab (50mg ) 1 hours prior to test   rifaximin (XIFAXAN) 550 MG TABS tablet Take 1 tablet by mouth 3 times daily for 14 days.   Thiamine HCl (THIAMINE PO) Take by mouth.     Allergies:   Aspirin; Iodinated diagnostic agents; Trazodone and nefazodone; Flagyl [metronidazole]; Iodine; and Morphine and related   Social History   Socioeconomic History   Marital status: Widowed    Spouse name: Not on file   Number of children: 2   Years of education: Not on file   Highest education level: Not on file  Occupational History   Occupation: RETIRED  Social Designer, fashion/clothing strain: Not on file   Food insecurity:    Worry: Not on file    Inability: Not on file   Transportation needs:    Medical: Not on file    Non-medical: Not on file  Tobacco Use   Smoking status: Former Smoker    Packs/day: 1.00    Years: 10.00    Pack years: 10.00    Types: Cigarettes    Last attempt to quit: 09/22/1985    Years since quitting: 33.0   Smokeless  tobacco: Never Used  Substance and Sexual Activity   Alcohol use: No    Comment: 09/29/11 "used to drink socially years ago"   Drug use: No   Sexual activity: Never    Comment: HRT x many yrs  Lifestyle   Physical activity:    Days per week: Not on file    Minutes per session: Not on file   Stress: Not on file  Relationships   Social connections:    Talks on phone: Not on file    Gets together: Not on file    Attends religious service: Not on file    Active member of club or organization: Not on file    Attends meetings of clubs or organizations: Not on file    Relationship status: Not on file  Other Topics Concern   Not on file  Social History Narrative   Pt gets regular exercise     Family History: The patient's family history includes Breast cancer in her cousin, maternal aunt, and paternal aunt; Colon cancer in her paternal uncle; Diabetes in her father and mother; Esophageal cancer in her son; Heart disease in her mother; Hypertension in her father; Kidney disease in her brother and sister.  ROS:   Please see the history of present illness.    All other systems reviewed and are negative.  EKGs/Labs/Other Studies Reviewed:    The following studies were reviewed today:  EKG:  NSR, HR 63 bpm, LVH  Recent Labs: 02/16/2018: Pro B Natriuretic peptide (BNP) 60.0 03/17/2018: TSH 0.73 07/05/2018: BUN 23; Creatinine, Ser 1.15; Potassium 4.6; Sodium 142  Recent Lipid Panel    Component Value Date/Time   CHOL 150 03/17/2018 1200   TRIG 112.0 03/17/2018 1200   HDL 75.50 03/17/2018 1200   CHOLHDL 2 03/17/2018 1200   VLDL 22.4 03/17/2018 1200   LDLCALC 52 03/17/2018 1200   LDLDIRECT 53.0 10/29/2015 1338    Physical Exam:    VS:  BP 114/84    Pulse 63  Ht 5\' 5"  (1.651 m)    Wt 207 lb 3.2 oz (94 kg)    BMI 34.48 kg/m     Wt Readings from Last 5 Encounters:  08/15/18 207 lb 3.2 oz (94 kg)  07/06/18 203 lb 9.6 oz (92.4 kg)  04/08/18 206 lb 1.9 oz (93.5 kg)    04/04/18 205 lb 6.4 oz (93.2 kg)  03/18/18 204 lb (92.5 kg)    Constitutional: No acute distress Eyes: sclera non-icteric, normal conjunctiva and lids ENMT: normal dentition, moist mucous membranes Cardiovascular: regular rhythm, normal rate, no murmurs. S1 and S2 normal. Radial pulses normal bilaterally. No jugular venous distention.  Respiratory: clear to auscultation bilaterally GI : normal bowel sounds, soft and nontender. No distention.   MSK: extremities warm, well perfused. No edema.  NEURO: grossly nonfocal exam, moves all extremities. PSYCH: alert and oriented x 3, normal mood and affect.   ASSESSMENT:    1. Leg cramping   2. Chest pain, unspecified type   3. Dilatation of aortic sinus of Valsalva    PLAN:    For occasional chest pain we will provide a nitro SL prescription and monitor for response. If she response well to nitro for chest pain, I would consider coronary spasm as a possible etiology.   We will obtain ABI to screen for PAD, as requested by the patient for symptoms of leg pain, cramping and discomfort.   TIME SPENT WITH PATIENT: 25 minutes of direct patient care. More than 50% of that time was spent on coordination of care and counseling regarding chest pain, review of testing, and follow up plan.  Cherlynn Kaiser, MD [place]   [facility]   Medication Adjustments/Labs and Tests Ordered: Current medicines are reviewed at length with the patient today.  Concerns regarding medicines are outlined above.  Orders Placed This Encounter  Procedures   EKG 12-Lead   Meds ordered this encounter  Medications   nitroGLYCERIN (NITROSTAT) 0.4 MG SL tablet    Sig: Place 5 tablets (2 mg total) under the tongue every 5 (five) minutes as needed.    Dispense:  25 tablet    Refill:  2    Patient Instructions  Medication Instructions:  Dr.Nyari Olsson has recommended you make the following change in your medication:   Use sublingual Nitro-glycerin as needed as  directed for chest pain. An Rx has been sent to your mail order pharmacy  If you need a refill on your cardiac medications before your next appointment, please call your pharmacy.   Lab work: None ordered   Testing/Procedures: Dr.Chestina Komatsu has requested that you have a lower extremity arterial  duplex. During this test, exercise and ultrasound are used to evaluate arterial blood flow in the legs. Allow one hour for this exam. There are no restrictions or special instructions.  Dr.Virgil Slinger has requested that you have an ankle brachial index (ABI). During this test an ultrasound and blood pressure cuff are used to evaluate the arteries that supply the arms and legs with blood. Allow thirty minutes for this exam. There are no restrictions or special instructions.    Follow-Up: At [facility], you and your health needs are our priority.  As part of our continuing mission to provide you with exceptional heart care, we have created designated Provider Care Teams.  These Care Teams include your primary Cardiologist (physician) and Advanced Practice Providers (APPs -  Physician Assistants and Nurse Practitioners) who all work together to provide you with the care you need, when you  need it. You will need a follow up appointment in 3 months.   You may see  Dr.Kazuki Ingle or one of the following Advanced Practice Providers on your designated Care Team:   Rosaria Ferries, PA-C  Jory Sims, DNP, ANP  Any Other Special Instructions Will Be Listed Below (If Applicable). Nitroglycerin sublingual tablets What is this medicine? NITROGLYCERIN (nye troe GLI ser in) is a type of vasodilator. It relaxes blood vessels, increasing the blood and oxygen supply to your heart. This medicine is used to relieve chest pain caused by angina. It is also used to prevent chest pain before activities like climbing stairs, going outdoors in cold weather, or sexual activity. This medicine may be used for other purposes; ask  your health care provider or pharmacist if you have questions. COMMON BRAND NAME(S): Nitroquick, Nitrostat, Nitrotab What should I tell my health care provider before I take this medicine? They need to know if you have any of these conditions: -anemia -head injury, recent stroke, or bleeding in the brain -liver disease -previous heart attack -an unusual or allergic reaction to nitroglycerin, other medicines, foods, dyes, or preservatives -pregnant or trying to get pregnant -breast-feeding How should I use this medicine? Take this medicine by mouth as needed. At the first sign of an angina attack (chest pain or tightness) place one tablet under your tongue. You can also take this medicine 5 to 10 minutes before an event likely to produce chest pain. Follow the directions on the prescription label. Let the tablet dissolve under the tongue. Do not swallow whole. Replace the dose if you accidentally swallow it. It will help if your mouth is not dry. Saliva around the tablet will help it to dissolve more quickly. Do not eat or drink, smoke or chew tobacco while a tablet is dissolving. If you are not better within 5 minutes after taking ONE dose of nitroglycerin, call 9-1-1 immediately to seek emergency medical care. Do not take more than 3 nitroglycerin tablets over 15 minutes. If you take this medicine often to relieve symptoms of angina, your doctor or health care professional may provide you with different instructions to manage your symptoms. If symptoms do not go away after following these instructions, it is important to call 9-1-1 immediately. Do not take more than 3 nitroglycerin tablets over 15 minutes. Talk to your pediatrician regarding the use of this medicine in children. Special care may be needed. Overdosage: If you think you have taken too much of this medicine contact a poison control center or emergency room at once. NOTE: This medicine is only for you. Do not share this medicine with  others. What if I miss a dose? This does not apply. This medicine is only used as needed. What may interact with this medicine? Do not take this medicine with any of the following medications: -certain migraine medicines like ergotamine and dihydroergotamine (DHE) -medicines used to treat erectile dysfunction like sildenafil, tadalafil, and vardenafil -riociguat This medicine may also interact with the following medications: -alteplase -aspirin -heparin -medicines for high blood pressure -medicines for mental depression -other medicines used to treat angina -phenothiazines like chlorpromazine, mesoridazine, prochlorperazine, thioridazine This list may not describe all possible interactions. Give your health care provider a list of all the medicines, herbs, non-prescription drugs, or dietary supplements you use. Also tell them if you smoke, drink alcohol, or use illegal drugs. Some items may interact with your medicine. What should I watch for while using this medicine? Tell your doctor or health care  professional if you feel your medicine is no longer working. Keep this medicine with you at all times. Sit or lie down when you take your medicine to prevent falling if you feel dizzy or faint after using it. Try to remain calm. This will help you to feel better faster. If you feel dizzy, take several deep breaths and lie down with your feet propped up, or bend forward with your head resting between your knees. You may get drowsy or dizzy. Do not drive, use machinery, or do anything that needs mental alertness until you know how this drug affects you. Do not stand or sit up quickly, especially if you are an older patient. This reduces the risk of dizzy or fainting spells. Alcohol can make you more drowsy and dizzy. Avoid alcoholic drinks. Do not treat yourself for coughs, colds, or pain while you are taking this medicine without asking your doctor or health care professional for advice. Some  ingredients may increase your blood pressure. What side effects may I notice from receiving this medicine? Side effects that you should report to your doctor or health care professional as soon as possible: -blurred vision -dry mouth -skin rash -sweating -the feeling of extreme pressure in the head -unusually weak or tired Side effects that usually do not require medical attention (report to your doctor or health care professional if they continue or are bothersome): -flushing of the face or neck -headache -irregular heartbeat, palpitations -nausea, vomiting This list may not describe all possible side effects. Call your doctor for medical advice about side effects. You may report side effects to FDA at 1-800-FDA-1088. Where should I keep my medicine? Keep out of the reach of children. Store at room temperature between 20 and 25 degrees C (68 and 77 degrees F). Store in [employer] of [employer]. Protect from light and moisture. Keep tightly closed. Throw away any unused medicine after the expiration date. NOTE: This sheet is a summary. It may not cover all possible information. If you have questions about this medicine, talk to your doctor, pharmacist, or health care provider.  2019 Elsevier/Gold Standard (2013-03-16 17:57:36)

## 2018-10-05 ENCOUNTER — Encounter: Payer: Self-pay | Admitting: Podiatry

## 2018-10-05 ENCOUNTER — Ambulatory Visit (INDEPENDENT_AMBULATORY_CARE_PROVIDER_SITE_OTHER): Payer: Medicare Other | Admitting: Podiatry

## 2018-10-05 ENCOUNTER — Other Ambulatory Visit: Payer: Self-pay

## 2018-10-05 VITALS — Temp 98.1°F

## 2018-10-05 DIAGNOSIS — L600 Ingrowing nail: Secondary | ICD-10-CM | POA: Diagnosis not present

## 2018-10-05 NOTE — Progress Notes (Signed)
Subjective: Patient presents today for evaluation of pain to the lateral border the left great toe.  Patient states that it is funny and not growing right patient is concerned for possible ingrown nail.  Patient also has a painful tender right great toe nail plate.  She says she is never had this before.  Patient normally gets routine pedicures however she has not been able to do the COVID-19 pandemic.  Patient presents today for further treatment and evaluation.  Past Medical History:  Diagnosis Date   ANEMIA, IRON DEFICIENCY 05/08/2009   Angina    ASYMPTOMATIC POSTMENOPAUSAL STATUS 10/11/2008   Blood transfusion    Blood transfusion without reported diagnosis    Breast cancer ([place]) 09/29/11   invasive grade III ductal ca,assoc high grade dcis,ER/PR=neg   C. difficile colitis    Diverticulosis of colon (without mention of hemorrhage)    Esophageal reflux 06/12/2008   Gastroparesis    GOITER, MULTINODULAR 04/02/2009   Gout, unspecified    H/O hiatal hernia    History of lower GI bleeding    History of radiation therapy 02/08/12-03/25/12   left breast,total 61gy   Hypokalemia 05/11/2013   Hypomagnesemia    HYPOTHYROIDISM, POST-RADIATION 08/13/2009   Internal hemorrhoids without mention of complication    Kidney stones    "several"   Leukopenia    Migraines    Obesity    Osteoarthrosis, unspecified whether generalized or localized, unspecified site    Other and unspecified hyperlipidemia    Personal history of chemotherapy 2013   Personal history of radiation therapy 2013   left   PONV (postoperative nausea and vomiting)    PUD (peptic ulcer disease)    Short bowel syndrome    Shortness of breath on exertion    "sometimes"   Stricture and stenosis of esophagus    Thyrotoxicosis without mention of goiter or other cause, without mention of thyrotoxic crisis or storm    Type II or unspecified type diabetes mellitus without mention of complication,  not stated as uncontrolled    no med in years diet controled   Unspecified essential hypertension    UTI (urinary tract infection)    Varicose veins    VITAMIN B12 DEFICIENCY 08/30/2009    Objective:  General: Well developed, nourished, in no acute distress, alert and oriented x3   Dermatology: Skin is warm, dry and supple bilateral.  Lateral border left great toe appears to be erythematous with evidence of an ingrowing nail. Pain on palpation noted to the border of the nail fold. The remaining nails appear unremarkable at this time. There are no open sores, lesions.  Vascular: Dorsalis Pedis artery and Posterior Tibial artery pedal pulses palpable. No lower extremity edema noted.   Neruologic: Grossly intact via light touch bilateral.  Musculoskeletal: Muscular strength within normal limits in all groups bilateral. Normal range of motion noted to all pedal and ankle joints.   Assesement: #1 Paronychia with ingrowing nail lateral border left great toe #2 Pain in toe #3  Dystrophic nails bilateral great toes  Plan of Care:  1. Patient evaluated.  2. Discussed treatment alternatives and plan of care. Explained nail avulsion procedure and post procedure course to patient. 3. Patient opted for permanent partial nail avulsion.  4. Prior to procedure, local anesthesia infiltration utilized using 3 ml of a 50:50 mixture of 2% plain lidocaine and 0.5% plain marcaine in a normal hallux block fashion and a betadine prep performed.  5. Partial permanent nail avulsion with chemical  matrixectomy performed using [id number] applications of phenol followed by alcohol flush.  6. Light dressing applied. 7. Return to clinic in 2 weeks.   Edrick Kins, DPM [facility]  Dr. Edrick Kins, [address]                                        [place], [place] [address]                Office [phone]  Fax [phone]

## 2018-10-05 NOTE — Patient Instructions (Signed)

## 2018-10-10 ENCOUNTER — Ambulatory Visit: Payer: Medicare Other | Admitting: Internal Medicine

## 2018-10-11 ENCOUNTER — Telehealth: Payer: Medicare Other | Admitting: Internal Medicine

## 2018-10-26 ENCOUNTER — Ambulatory Visit (INDEPENDENT_AMBULATORY_CARE_PROVIDER_SITE_OTHER): Payer: Medicare Other | Admitting: Podiatry

## 2018-10-26 ENCOUNTER — Other Ambulatory Visit: Payer: Self-pay

## 2018-10-26 ENCOUNTER — Encounter: Payer: Self-pay | Admitting: Podiatry

## 2018-10-26 VITALS — Temp 97.3°F

## 2018-10-26 DIAGNOSIS — L600 Ingrowing nail: Secondary | ICD-10-CM

## 2018-10-27 ENCOUNTER — Telehealth: Payer: Self-pay | Admitting: Family

## 2018-10-27 NOTE — Telephone Encounter (Signed)
Copied from [place] [id number]. Topic: Quick Communication - Rx Refill/Question >> Oct 27, 2018  9:43 AM Percell Belt A wrote: Medication: methocarbamol (ROBAXIN) 500 MG tablet [[id number]]   Has the patient contacted their pharmacy? No. (Agent: If no, request that the patient contact the pharmacy for the refill.) (Agent: If yes, when and what did the pharmacy advise?)  Preferred Pharmacy (with phone number or street name): [id number] [address], [place]. [phone] (Phone)   Agent: Please be advised that RX refills may take up to 3 business days. We ask that you follow-up with your pharmacy.

## 2018-10-28 ENCOUNTER — Other Ambulatory Visit: Payer: Self-pay | Admitting: Family

## 2018-10-28 MED ORDER — METHOCARBAMOL 500 MG PO TABS: 500.0000 mg | ORAL_TABLET | Freq: Three times a day (TID) | ORAL | 0 refills | Status: DC | PRN

## 2018-10-30 NOTE — Progress Notes (Signed)
   Subjective: 73 y.o. female presents today status post permanent nail avulsion procedure of the lateral border of the left hallux that was performed on 10/05/2018.She states the wound is healing appropriately. She denies any significant pain or modifying factors. Patient is here for further evaluation and treatment.    Past Medical History:  Diagnosis Date  . ANEMIA, IRON DEFICIENCY 05/08/2009  . Angina   . ASYMPTOMATIC POSTMENOPAUSAL STATUS 10/11/2008  . Blood transfusion   . Blood transfusion without reported diagnosis   . Breast cancer ([place]) 09/29/11   invasive grade III ductal ca,assoc high grade dcis,ER/PR=neg  . C. difficile colitis   . Diverticulosis of colon (without mention of hemorrhage)   . Esophageal reflux 06/12/2008  . Gastroparesis   . GOITER, MULTINODULAR 04/02/2009  . Gout, unspecified   . H/O hiatal hernia   . History of lower GI bleeding   . History of radiation therapy 02/08/12-03/25/12   left breast,total 61gy  . Hypokalemia 05/11/2013  . Hypomagnesemia   . HYPOTHYROIDISM, POST-RADIATION 08/13/2009  . Internal hemorrhoids without mention of complication   . Kidney stones    "several"  . Leukopenia   . Migraines   . Obesity   . Osteoarthrosis, unspecified whether generalized or localized, unspecified site   . Other and unspecified hyperlipidemia   . Personal history of chemotherapy 2013  . Personal history of radiation therapy 2013   left  . PONV (postoperative nausea and vomiting)   . PUD (peptic ulcer disease)   . Short bowel syndrome   . Shortness of breath on exertion    "sometimes"  . Stricture and stenosis of esophagus   . Thyrotoxicosis without mention of goiter or other cause, without mention of thyrotoxic crisis or storm   . Type II or unspecified type diabetes mellitus without mention of complication, not stated as uncontrolled    no med in years diet controled  . Unspecified essential hypertension   . UTI (urinary tract infection)   . Varicose  veins   . VITAMIN B12 DEFICIENCY 08/30/2009    Objective: Skin is warm, dry and supple. Nail and respective nail fold appears to be healing appropriately. Open wound to the associated nail fold with a granular wound base and moderate amount of fibrotic tissue. Minimal drainage noted. Mild erythema around the periungual region likely due to phenol chemical matricectomy.  Assessment: #1 postop permanent partial nail avulsion lateral border left hallux  #2 open wound periungual nail fold of respective digit.   Plan of care: #1 patient was evaluated  #2 debridement of open wound was performed to the periungual border of the respective toe using a currette. Antibiotic ointment and Band-Aid was applied. #3 patient is to return to clinic on a PRN basis.   Edrick Kins, DPM [facility]  Dr. Edrick Kins, [address]                                        [place], [place] [address]                Office [phone]  Fax [phone]

## 2018-11-02 ENCOUNTER — Ambulatory Visit
Admission: RE | Admit: 2018-11-02 | Discharge: 2018-11-02 | Disposition: A | Discharge status: Home / Self Care | Payer: Medicare Other | Source: Ambulatory Visit | Attending: Family | Admitting: Family

## 2018-11-02 ENCOUNTER — Other Ambulatory Visit: Payer: Self-pay

## 2018-11-02 DIAGNOSIS — Z1231 Encounter for screening mammogram for malignant neoplasm of breast: Secondary | ICD-10-CM

## 2018-11-21 ENCOUNTER — Telehealth: Payer: Self-pay | Admitting: Gastroenterology

## 2018-11-21 ENCOUNTER — Other Ambulatory Visit: Payer: Self-pay

## 2018-11-21 MED ORDER — DICYCLOMINE HCL 10 MG PO CAPS: 10.0000 mg | ORAL_CAPSULE | Freq: Four times a day (QID) | ORAL | 0 refills | Status: DC

## 2018-11-21 MED ORDER — RIFAXIMIN 550 MG PO TABS: ORAL_TABLET | ORAL | 0 refills | Status: DC

## 2018-11-21 NOTE — Telephone Encounter (Signed)
Fuller Plan pt calling stating she is having bad diarrhea and abdominal cramping and pain again. She is requesting xifaxan, lomotil and bentyl. States Amy Rachel Hall PA prescribed xifaxan and it really helped her. Dr. Ardis Hughs please advise as DOD.

## 2018-11-21 NOTE — Telephone Encounter (Signed)
Patient called said she is having very bad Diarrhea and would like to have some med called in.

## 2018-11-21 NOTE — Telephone Encounter (Signed)
Pt aware, scheduled for telehealth visit with [name] 12/16/18@11 :30am. Pt aware of appt. Script sent to pharmacy.

## 2018-11-21 NOTE — Telephone Encounter (Signed)
Would recommend one at a time.  Let's start with a course of xifaxan, she has been thought to have SIBO previously.  Prescribe 550mg  TID for 2 weeks.  She needs ROV (telemed or in person) with [name] in next 2-3 weeks as well. Thanks

## 2018-11-23 ENCOUNTER — Telehealth: Payer: Self-pay | Admitting: Family

## 2018-11-23 NOTE — Telephone Encounter (Signed)
I received a renewal parking placard in my box for this patient.   Form has been completed & placed in providers box to review and sign if she approves.

## 2018-11-23 NOTE — Telephone Encounter (Signed)
Forms have been signed, Copy sent to scan.  Patient informed and original mailed to patient as she requested.

## 2018-12-14 ENCOUNTER — Telehealth: Payer: Self-pay

## 2018-12-14 NOTE — Telephone Encounter (Signed)
Covid-19 screening questions   Do you now or have you had a fever in the last 14 days? no  Do you have any respiratory symptoms of shortness of breath or cough now or in the last 14 days? No   Do you have any family members or close contacts with diagnosed or suspected Covid-19 in the past 14 days? no  Have you been tested for Covid-19 and found to be positive? No

## 2018-12-16 ENCOUNTER — Encounter: Payer: Self-pay | Admitting: Gastroenterology

## 2018-12-16 ENCOUNTER — Ambulatory Visit (INDEPENDENT_AMBULATORY_CARE_PROVIDER_SITE_OTHER): Payer: Medicare Other | Admitting: Gastroenterology

## 2018-12-16 ENCOUNTER — Other Ambulatory Visit: Payer: Self-pay

## 2018-12-16 VITALS — BP 128/72 | HR 68 | Temp 97.2°F | Ht 65.0 in | Wt 209.2 lb

## 2018-12-16 DIAGNOSIS — R197 Diarrhea, unspecified: Secondary | ICD-10-CM | POA: Diagnosis not present

## 2018-12-16 DIAGNOSIS — K6389 Other specified diseases of intestine: Secondary | ICD-10-CM

## 2018-12-16 DIAGNOSIS — R1031 Right lower quadrant pain: Secondary | ICD-10-CM | POA: Diagnosis not present

## 2018-12-16 MED ORDER — HYDROCODONE-ACETAMINOPHEN 7.5-325 MG PO TABS: 1.0000 | ORAL_TABLET | Freq: Four times a day (QID) | ORAL | 0 refills | Status: DC | PRN

## 2018-12-16 MED ORDER — RIFAXIMIN 550 MG PO TABS: ORAL_TABLET | ORAL | 0 refills | Status: DC

## 2018-12-16 MED ORDER — AMITRIPTYLINE HCL 10 MG PO TABS: ORAL_TABLET | ORAL | 0 refills | Status: DC

## 2018-12-16 MED ORDER — AMITRIPTYLINE HCL 50 MG PO TABS: 50.0000 mg | ORAL_TABLET | Freq: Every day | ORAL | 0 refills | Status: DC

## 2018-12-16 MED ORDER — DICYCLOMINE HCL 20 MG PO TABS: 20.0000 mg | ORAL_TABLET | Freq: Three times a day (TID) | ORAL | 3 refills | Status: DC

## 2018-12-16 NOTE — Progress Notes (Signed)
    History of Present Illness: This is a 73 year old female with a many year history of chronic abdominal pain with multiple prior abdominal surgeries and history of previous small bowel obstructions.  She is status post TAH, sigmoid colectomy, adhesion lysis and cholecystectomy.  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 She has noted an increase in her chronic diarrhea with urgency and occasional incontinence. Xifaxan was prescribed in June but pt relates her pharmacy did not have the prescription.  She relates that cholestyramine has not been effective so she discontinued it.  Lomotil as some impact and she uses it frequently.  Reflux symptoms are under good control on current regimen.  RLQ pain is not controlled with Tylenol. She states that she cannot take Advil or other NSAIDs. She states Tramadol is not effective. Hydrocodone has been effective. Last colonoscopy in 09/2010. Denies weight loss, constipation, change in stool caliber, melena, hematochezia, nausea, vomiting, dysphagia, reflux symptoms, chest pain.  Current Medications, Allergies, Past Medical History, Past Surgical History, Family History and Social History were reviewed in [employer] record.    Physical Exam: General: Well developed, well nourished, no acute distress Head: Normocephalic and atraumatic Eyes:  sclerae anicteric, EOMI Ears: Normal auditory acuity Mouth: No deformity or lesions Lungs: Clear throughout to auscultation Heart: Regular rate and rhythm; no murmurs, rubs or bruits Abdomen: Soft, mild RLQ tenderness and non distended. No masses, hepatosplenomegaly or hernias noted. Normal Bowel sounds Rectal: Not done Musculoskeletal: Symmetrical with no gross deformities  Pulses:  Normal pulses noted Extremities: No clubbing, cyanosis, edema or deformities noted Neurological: Alert oriented x  4, grossly nonfocal Psychological:  Alert and cooperative. Normal mood and affect   Assessment and Recommendations:  1. 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 History of recurrent SBOs and multiple abdominal surgeries.   Xifaxan 550 mg p.o. TID for 14 days for SIBO.   Increase dicyclomine to 20 mg p.o. QID, before meals and at bedtime.   Supply a short-term hydrocodone prescription with no refills.  Patient understands that long-term narcotic use for her chronic GI problems is not an safe solution and it is associated with substantial risks so we cannot provide this as an ongoing mgmt option. To help manage chronic pain begin amitriptyline 10 mg at bedtime, after 1 week increase to 25 mg at bedtime and after 1 week increase to 50 mg at bedtime.  If abdominal pain cannot be adequately controlled without narcotic usage recommend pain clinic management.  REV in 2 months.   2.  GERD.  Follow antireflux measures.  Continue pantoprazole 40 mg p.o. every morning and famotidine 40 mg nightly.

## 2018-12-16 NOTE — Patient Instructions (Addendum)
Increase your dicyclomine to 20 mg four times a day before meals and at bedtime. A new prescription has been sent to your pharmacy.  Per your request we sent all of the following medications to your mail order pharmacy:amitriptyline, xifaxan and dicyclomine. We sent the prescription for Norco to your local pharmacy.   Stat taking the amitriptyline 10 mg at bedtime, after one week increase to 25 mg at bedtime, and after another week increase to 50 mg at bedtime. Remain on 50 mg dosage.    Thank you for choosing me and [facility].  Pricilla Riffle. Dagoberto Ligas., MD., [facility]

## 2018-12-26 ENCOUNTER — Telehealth: Payer: Self-pay | Admitting: Internal Medicine

## 2018-12-26 ENCOUNTER — Telehealth: Payer: Self-pay | Admitting: Gastroenterology

## 2018-12-26 NOTE — Telephone Encounter (Signed)
Patient called said that she would like to know what the medication amitriptyline (ELAVIL) 50 MG is for.

## 2018-12-26 NOTE — Telephone Encounter (Signed)

## 2018-12-26 NOTE — Telephone Encounter (Signed)
Informed patient that amitriptyline is being prescribed for pain. Patient states she was just concerned because it states it is a antidepressant. Informed patient it can be prescribed for that as well but we are prescribing it for her pain. Patient wanted to confirm dosage. Informed her to to take 10 mg x 1 week, increase to 25 mg x 1 week then remain on 50 mg at bedtime. Patient verbalized understanding.

## 2018-12-27 ENCOUNTER — Encounter: Payer: Self-pay | Admitting: Internal Medicine

## 2018-12-27 ENCOUNTER — Ambulatory Visit: Payer: Self-pay

## 2018-12-27 ENCOUNTER — Telehealth: Payer: Self-pay | Admitting: Family

## 2018-12-27 ENCOUNTER — Ambulatory Visit (INDEPENDENT_AMBULATORY_CARE_PROVIDER_SITE_OTHER): Payer: Medicare Other | Admitting: Family

## 2018-12-27 ENCOUNTER — Other Ambulatory Visit (INDEPENDENT_AMBULATORY_CARE_PROVIDER_SITE_OTHER): Payer: Medicare Other

## 2018-12-27 ENCOUNTER — Encounter: Payer: Self-pay | Admitting: Family

## 2018-12-27 ENCOUNTER — Telehealth (INDEPENDENT_AMBULATORY_CARE_PROVIDER_SITE_OTHER): Payer: Medicare Other | Admitting: Internal Medicine

## 2018-12-27 ENCOUNTER — Other Ambulatory Visit: Payer: Self-pay

## 2018-12-27 ENCOUNTER — Telehealth: Payer: Self-pay | Admitting: *Deleted

## 2018-12-27 VITALS — BP 118/78 | HR 63 | Temp 97.8°F | Ht 65.0 in | Wt 210.1 lb

## 2018-12-27 VITALS — Ht 65.0 in

## 2018-12-27 DIAGNOSIS — Q2549 Other congenital malformations of aorta: Secondary | ICD-10-CM

## 2018-12-27 DIAGNOSIS — E890 Postprocedural hypothyroidism: Secondary | ICD-10-CM | POA: Diagnosis not present

## 2018-12-27 DIAGNOSIS — R0982 Postnasal drip: Secondary | ICD-10-CM

## 2018-12-27 DIAGNOSIS — R079 Chest pain, unspecified: Secondary | ICD-10-CM

## 2018-12-27 DIAGNOSIS — R252 Cramp and spasm: Secondary | ICD-10-CM | POA: Diagnosis not present

## 2018-12-27 DIAGNOSIS — Q2544 Congenital dilation of aorta: Secondary | ICD-10-CM

## 2018-12-27 LAB — CBC WITH DIFFERENTIAL/PLATELET
Basophils Absolute: 0 10*3/uL (ref 0.0–0.1)
Basophils Relative: 0.3 % (ref 0.0–3.0)
Eosinophils Absolute: 0.1 10*3/uL (ref 0.0–0.7)
Eosinophils Relative: 1.5 % (ref 0.0–5.0)
HCT: 32.2 % — ABNORMAL LOW (ref 36.0–46.0)
Hemoglobin: 10.5 g/dL — ABNORMAL LOW (ref 12.0–15.0)
Lymphocytes Relative: 37.8 % (ref 12.0–46.0)
Lymphs Abs: 1.8 10*3/uL (ref 0.7–4.0)
MCHC: 32.6 g/dL (ref 30.0–36.0)
MCV: 93.7 fl (ref 78.0–100.0)
Monocytes Absolute: 0.4 10*3/uL (ref 0.1–1.0)
Monocytes Relative: 9.7 % (ref 3.0–12.0)
Neutro Abs: 2.4 10*3/uL (ref 1.4–7.7)
Neutrophils Relative %: 50.7 % (ref 43.0–77.0)
Platelets: 221 10*3/uL (ref 150.0–400.0)
RBC: 3.44 Mil/uL — ABNORMAL LOW (ref 3.87–5.11)
RDW: 13 % (ref 11.5–15.5)
WBC: 4.6 10*3/uL (ref 4.0–10.5)

## 2018-12-27 LAB — TSH: TSH: 0.47 u[IU]/mL (ref 0.35–4.50)

## 2018-12-27 MED ORDER — NITROGLYCERIN 0.4 MG SL SUBL: 0.4000 mg | SUBLINGUAL_TABLET | SUBLINGUAL | 6 refills | Status: DC | PRN

## 2018-12-27 MED ORDER — LORATADINE 10 MG PO TABS: 10.0000 mg | ORAL_TABLET | Freq: Every day | ORAL | 11 refills | Status: DC

## 2018-12-27 MED ORDER — FLUTICASONE PROPIONATE 50 MCG/ACT NA SUSP: 2.0000 | Freq: Every day | NASAL | 6 refills | Status: DC

## 2018-12-27 NOTE — Telephone Encounter (Signed)
Incoming call from Patient with a complaint of hoarse, sore throat Painand swelling.    Patient feels that thyroid is acting up.  Rates the pain as severe an constant .  Radiates to right ear. Reviewed protocol. Transferred patient to [facility] to schedule appointment.          Reason for Disposition . [1] Tender node in the groin AND [2] has a sore, scratch, cut or painful red area on that leg  Answer Assessment - Initial Assessment Questions 1. ONSET: "When did the pain begin?"     Weekend   2. LOCATION: "Where does it hurt?"     Thyroid area 3. PATTERN "Does the pain come and go, or has it been constant since it started?"      constant 4. SEVERITY: "How bad is the pain?"  (Scale 1-10; or mild, moderate, severe)   - MILD (1-3): doesn't interfere with normal activities    - MODERATE (4-7): interferes with normal activities or awakens from sleep    - SEVERE (8-10):  excruciating pain, unable to do any normal activities     severe 5. RADIATION: "Does the pain go anywhere else, shoot into your arms?"     To right ear 6. CORD SYMPTOMS: "Any weakness or numbness of the arms or legs?"     denies 7. CAUSE: "What do you think is causing the neck pain?"     thyroid 8. NECK OVERUSE: "Any recent activities that involved turning or twisting the neck?"     denies 9. OTHER SYMPTOMS: "Do you have any other symptoms?" (e.g., headache, fever, chest pain, difficulty breathing, neck swelling)     Neck neck is swollen 10. PREGNANCY: "Is there any chance you are pregnant?" "When was your last menstrual period?"      na  Protocols used: LYMPH NODES - SWOLLEN-A-AH, NECK PAIN OR STIFFNESS-A-AH

## 2018-12-27 NOTE — Telephone Encounter (Signed)
LATE ENTRY - SPOKE TO PATIENT - INSTRUCTION GIVEN FROM TELE-VISIT 7/28.  AVS SUMMARY IS MAILED TO PATIENT.  PRESCRIPTION  E-SENT TO PHARMACY RECALL F/U  PLACED  PATIENT VERBALIZED  UNDERSTANDING

## 2018-12-27 NOTE — Progress Notes (Signed)
Virtual Visit via Telephone Note   This visit type was conducted due to national recommendations for restrictions regarding the COVID-19 Pandemic (e.g. social distancing) in an effort to limit this patient's exposure and mitigate transmission in our community.  Due to her co-morbid illnesses, this patient is at least at moderate risk for complications without adequate follow up.  This format is felt to be most appropriate for this patient at this time.  The patient did not have access to video technology/had technical difficulties with video requiring transitioning to audio format only (telephone).  All issues noted in this document were discussed and addressed.  No physical exam could be performed with this format.  Please refer to the patient's chart for her  consent to telehealth for [facility].   Date:  12/27/2018   ID:  [name], DOB [birth year], MRN [id number]  Patient Location: Home Provider Location: Home  PCP:  Marrian Salvage, [place]  Cardiologist:  No primary care provider on file.  Electrophysiologist:  None   Evaluation Performed:  Follow-Up Visit  Chief Complaint:  F/u chest pain  History of Present Illness:    [name] is a 73 y.o. female with hx of left breast cancer with chemotherapy and radiation, GERD, hypothyroidism, mild CAD by CCTA, and diabetes mellitus type 2 diet controlled.  Had to take nitro once for chest pain in March/April but since has not taken any since. Overall feeling well.   Still has cramping in legs, ABIs were normal. Doesn't take gabapentin. Takes robaxin for leg pain - 2-3 x week per PCP.  Feels like her thyroid is swollen, is going to call PCP. I agree, should consider thyroid cascade labs and ultrasound.   The patient does not have symptoms concerning for COVID-19 infection (fever, chills, cough, or new shortness of breath).    Past Medical History:  Diagnosis Date  . ANEMIA, IRON DEFICIENCY 05/08/2009  .  Angina   . ASYMPTOMATIC POSTMENOPAUSAL STATUS 10/11/2008  . Blood transfusion   . Blood transfusion without reported diagnosis   . Breast cancer ([place]) 09/29/11   invasive grade III ductal ca,assoc high grade dcis,ER/PR=neg  . C. difficile colitis   . Diverticulosis of colon (without mention of hemorrhage)   . Esophageal reflux 06/12/2008  . Gastroparesis   . GOITER, MULTINODULAR 04/02/2009  . Gout, unspecified   . H/O hiatal hernia   . History of lower GI bleeding   . History of radiation therapy 02/08/12-03/25/12   left breast,total 61gy  . Hypokalemia 05/11/2013  . Hypomagnesemia   . HYPOTHYROIDISM, POST-RADIATION 08/13/2009  . Internal hemorrhoids without mention of complication   . Kidney stones    "several"  . Leukopenia   . Migraines   . Obesity   . Osteoarthrosis, unspecified whether generalized or localized, unspecified site   . Other and unspecified hyperlipidemia   . Personal history of chemotherapy 2013  . Personal history of radiation therapy 2013   left  . PONV (postoperative nausea and vomiting)   . PUD (peptic ulcer disease)   . Short bowel syndrome   . Shortness of breath on exertion    "sometimes"  . Stricture and stenosis of esophagus   . Thyrotoxicosis without mention of goiter or other cause, without mention of thyrotoxic crisis or storm   . Type II or unspecified type diabetes mellitus without mention of complication, not stated as uncontrolled    no med in years diet controled  . Unspecified essential hypertension   .  UTI (urinary tract infection)   . Varicose veins   . VITAMIN B12 DEFICIENCY 08/30/2009   Past Surgical History:  Procedure Laterality Date  . ABDOMINAL ADHESION SURGERY  1980's thru 1990's   "several"  . ABDOMINAL HYSTERECTOMY  1970's   with BSO  . BREAST BIOPSY Left 08/13/11   left breast lower inner quadrant  . BREAST BIOPSY Right 1985   Rt exc bx, benign  . BREAST LUMPECTOMY Left 08/2011  . BREAST LUMPECTOMY W/ NEEDLE LOCALIZATION   09/29/11   left  breast=lymph node,excision benign/ ER/PR=neg, her 2 Positive  . BUNIONECTOMY  1970's   bilateral  . CHOLECYSTECTOMY  1990's  . COLON SURGERY     "several surgeries for short bowel syndrome"  . COLONOSCOPY  2012   multiple   . DILATION AND CURETTAGE OF UTERUS    . ESOPHAGOGASTRODUODENOSCOPY  2011   multiple   . EXCISIONAL HEMORRHOIDECTOMY  11/10/2016  . EYE SURGERY     "long time ago"  . FLEXIBLE SIGMOIDOSCOPY  2011   multiple   . KIDNEY STONE SURGERY  1990's   "tried to go up & get it but pushed it further up"  . LITHOTRIPSY     "4 or 5 times"  . MASTECTOMY W/ NODES PARTIAL  09/29/11   left  . PORT-A-CATH REMOVAL Right 12/19/2013   Procedure: MINOR REMOVAL PORT-A-CATH;  Surgeon: Adin Hector, MD;  Location: [facility];  Service: General;  Laterality: Right;  . PORTACATH PLACEMENT  09/29/2011   Procedure: INSERTION PORT-A-CATH;  Surgeon: Adin Hector, MD;  Location: [facility];  Service: General;  Laterality: N/A;  . Thyroid Ultrasound  12/1994 and 12/1995  . TOTAL KNEE ARTHROPLASTY Right 06/05/2015   Procedure: RIGHT TOTAL KNEE ARTHROPLASTY;  Surgeon: Ninetta Lights, MD;  Location: [facility];  Service: Orthopedics;  Laterality: Right;  . VEIN LIGATION AND STRIPPING  1980's   Right leg     No outpatient medications have been marked as taking for the 12/27/18 encounter (Telemedicine) with Elouise Munroe, MD.     Allergies:   Aspirin, Iodinated diagnostic agents, Trazodone and nefazodone, Flagyl [metronidazole], Iodine, and Morphine and related   Social History   Tobacco Use  . Smoking status: Former Smoker    Packs/day: 1.00    Years: 10.00    Pack years: 10.00    Types: Cigarettes    Quit date: 09/22/1985    Years since quitting: 33.2  . Smokeless tobacco: Never Used  Substance Use Topics  . Alcohol use: No    Comment: 09/29/11 "used to drink socially years ago"  . Drug use: No     Family Hx: The patient's family history includes Breast  cancer in her cousin, maternal aunt, and paternal aunt; Colon cancer in her paternal uncle; Diabetes in her father and mother; Esophageal cancer in her son; Heart disease in her mother; Hypertension in her father; Kidney disease in her brother and sister.  ROS:   Please see the history of present illness.     All other systems reviewed and are negative.   Prior CV studies:   The following studies were reviewed today:    Labs/Other Tests and Data Reviewed:    EKG:  No ECG reviewed.  Recent Labs: 02/16/2018: Pro B Natriuretic peptide (BNP) 60.0 03/17/2018: TSH 0.73 07/05/2018: BUN 23; Creatinine, Ser 1.15; Potassium 4.6; Sodium 142   Recent Lipid Panel Lab Results  Component Value Date/Time   CHOL 150 03/17/2018 12:00 PM  TRIG 112.0 03/17/2018 12:00 PM   HDL 75.50 03/17/2018 12:00 PM   CHOLHDL 2 03/17/2018 12:00 PM   LDLCALC 52 03/17/2018 12:00 PM   LDLDIRECT 53.0 10/29/2015 01:38 PM    Wt Readings from Last 3 Encounters:  12/16/18 209 lb 4 oz (94.9 kg)  08/15/18 207 lb 3.2 oz (94 kg)  07/06/18 203 lb 9.6 oz (92.4 kg)     Objective:    Vital Signs:  There were no vitals taken for this visit.   GEN:  no acute distress  ASSESSMENT & PLAN:    1. Chest pain, unspecified type   2. Dilatation of aortic sinus of Valsalva   3. Leg cramping    Chest pain - will send new nitro script as hers will lose potency in September. Instructed again on use of nitro, and red flag symptoms to call office or go to ED.  CV risk reduction - Lipids per primary, on lovastatin. Last lipid panel was optimized.  Dilated sinuses of valsalva - recheck with CTA in 1 year from last scan.  F/u with me 6 mo  COVID-19 Education: The signs and symptoms of COVID-19 were discussed with the patient and how to seek care for testing (follow up with PCP or arrange E-visit).  The importance of social distancing was discussed today.  Time:   Today, I have spent 25 minutes with the patient with  telehealth technology discussing the above problems.     Medication Adjustments/Labs and Tests Ordered: Current medicines are reviewed at length with the patient today.  Concerns regarding medicines are outlined above.   Tests Ordered: No orders of the defined types were placed in this encounter.   Medication Changes: No orders of the defined types were placed in this encounter.   Follow Up:  Virtual Visit or In Person in 6 month(s)  Signed, Elouise Munroe, MD  12/27/2018 11:06 AM    [place]

## 2018-12-27 NOTE — Addendum Note (Signed)
Addended by: Raiford Simmonds on: 12/27/2018 11:27 AM   Modules accepted: Orders

## 2018-12-27 NOTE — Progress Notes (Signed)
[name] is a 73 y.o. female with the following history as recorded in EpicCare:  Patient Active Problem List   Diagnosis Date Noted  . Dyspnea 02/16/2018  . 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 UTI (urinary tract infection) 11/27/2014  . UTI (lower urinary tract infection) 11/27/2014  . Acute cystitis with hematuria 11/13/2014  . Hemorrhoids, external 11/04/2014  . Wellness examination 10/03/2014  . Pain in joint, shoulder region 09/24/2014  . Cramp in limb 06/26/2014  . Rectal bleeding 05/02/2014  . External hemorrhoids 05/02/2014  . Hemorrhoids without complication 22/07/5425  . Pain in joint, lower leg 02/01/2014  . Vitamin D deficiency 01/16/2014  . Gastroparesis 09/14/2013  . Other dysphagia 09/04/2013  . Nausea alone 09/04/2013  . Iron deficiency anemia 08/28/2013  . Hypomagnesemia 05/11/2013  . Hypokalemia 05/11/2013  . Generalized weakness 05/11/2013  . Dehydration 11/25/2011  . Fatigue 11/16/2011  . Breast cancer ([place]) 09/29/2011  . Family history of breast cancer 08/19/2011  . Primary cancer of lower-inner quadrant of left female breast ([place]) 08/17/2011  . Neck pain 07/29/2011  . Routine general medical examination at a health care facility 06/28/2011  . Edema 11/26/2010  . CLOSTRIDIUM DIFFICILE COLITIS 02/07/2010  . Blind loop syndrome 10/01/2009  . Abdominal pain 10/01/2009  . Vitamin B 12 deficiency 08/30/2009  . 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 Rash and nonspecific skin eruption 08/13/2009  . GOITER, MULTINODULAR 04/02/2009  . CONTACT DERMATITIS&OTHER ECZEMA DUE UNSPEC CAUSE 02/20/2009  . URINARY CALCULUS 10/11/2008  . Asymptomatic menopausal state 10/11/2008  . BACK PAIN, CHRONIC 07/03/2008  . Esophageal reflux 06/12/2008  . Diarrhea 06/12/2008  . ABDOMINAL PAIN-RUQ 05/17/2008  . Diabetes ([place]) 12/10/2006  . Dyslipidemia 12/10/2006  . Gout 12/10/2006  . HYPERTENSION 12/10/2006  . DIVERTICULOSIS,  COLON 12/10/2006  . OSTEOARTHRITIS 12/10/2006  . ESOPHAGEAL STRICTURE 08/23/2002  . HIATAL HERNIA 08/23/2002  . INTERNAL HEMORRHOIDS 02/02/2001    Current Outpatient Medications  Medication Sig Dispense Refill  . allopurinol (ZYLOPRIM) 100 MG tablet Take 1 tablet (100 mg total) by mouth daily. 90 tablet 3  . aMILoride (MIDAMOR) 5 MG tablet Take 1 tablet (5 mg total) by mouth 2 (two) times daily. 180 tablet 3  . amitriptyline (ELAVIL) 50 MG tablet Take 1 tablet (50 mg total) by mouth at bedtime. 90 tablet 0  . busPIRone (BUSPAR) 15 MG tablet Take 1 tablet (15 mg total) by mouth 2 (two) times daily. 90 tablet 3  . calcium carbonate (TUMS - DOSED IN MG ELEMENTAL CALCIUM) 500 MG chewable tablet Chew 1 tablet by mouth as needed for indigestion or heartburn.    . Calcium Carbonate-Vitamin D (CALCIUM-VITAMIN D) 600-200 MG-UNIT CAPS Take 1 capsule by mouth daily.      . cholestyramine (QUESTRAN) 4 g packet Take 1 packet in juice or water by mouth twice daily. 180 each 1  . Cyanocobalamin (VITAMIN B 12 PO) Take 1 tablet by mouth 2 (two) times daily.     Marland Kitchen dicyclomine (BENTYL) 20 MG tablet Take 1 tablet (20 mg total) by mouth 4 (four) times daily -  before meals and at bedtime. 360 tablet 3  . diphenoxylate-atropine (LOMOTIL) 2.5-0.025 MG tablet Take 1 tablet by mouth 3 times daily as needed for diarrhea. 100 tablet 2  . famotidine (PEPCID) 40 MG tablet Take 1 tablet (40 mg total) by mouth at bedtime. 90 tablet 1  . gabapentin (NEURONTIN) 300 MG capsule Take 2 capsules (600 mg total) by mouth 2 (two)  times daily. 360 capsule 3  . HYDROcodone-acetaminophen (NORCO) 7.5-325 MG tablet Take 1 tablet by mouth every 6 (six) hours as needed for moderate pain. 30 tablet 0  . levothyroxine (SYNTHROID, LEVOTHROID) 200 MCG tablet Take 1 tablet (200 mcg total) by mouth daily before breakfast. 90 tablet 3  . Lidocaine, Anorectal, 5 % GEL Apply a pea-sized amount to the affected area 3 times daily. 30 g 0  .  lovastatin (MEVACOR) 20 MG tablet TAKE 1 TABLET BY MOUTH  DAILY AT 6 PM. 90 tablet 2  . Magnesium Chloride 64 MG TBEC Take 64 mg by mouth daily.    . methocarbamol (ROBAXIN) 500 MG tablet Take 1 tablet (500 mg total) by mouth every 8 (eight) hours as needed for muscle spasms. 30 tablet 0  . metoprolol tartrate (LOPRESSOR) 50 MG tablet Take 1 tablet (50mg ) 2 hours prior to your Cardiac CT 1 tablet 0  . nitroGLYCERIN (NITROSTAT) 0.4 MG SL tablet Place 1 tablet (0.4 mg total) under the tongue every 5 (five) minutes as needed. 25 tablet 6  . ondansetron (ZOFRAN-ODT) 4 MG disintegrating tablet Take 1 tablet (4 mg total) by mouth every 8 (eight) hours as needed. for nausea 90 tablet 0  . pantoprazole (PROTONIX) 40 MG tablet TAKE 1 TABLET BY MOUTH 30 MINUTES PRIOR TO BREAKFAST AND SUPPER 60 tablet 11  . predniSONE (DELTASONE) 50 MG tablet Take 1 tab (50mg ) 13 hours prior to test,Take 1 tab (50mg ) 7 hours prior to test, Take 1 tab (50mg ) 1 hours prior to test 3 tablet 0  . rifaximin (XIFAXAN) 550 MG TABS tablet Take 1 tablet by mouth 3 times daily for 14 days. 42 tablet 0  . Thiamine HCl (THIAMINE PO) Take by mouth.    Marland Kitchen amitriptyline (ELAVIL) 10 MG tablet Take 1 tablet by mouth at bedtime x 1 week, then increase to 2.5 tablets by mouth x 1 week, then pick up rx for 50 mg tablets to remain on that dosage (Patient not taking: Reported on 12/27/2018) 25 tablet 0  . fluticasone (FLONASE) 50 MCG/ACT nasal spray Place 2 sprays into both nostrils daily. 16 g 6  . loratadine (CLARITIN) 10 MG tablet Take 1 tablet (10 mg total) by mouth daily. 30 tablet 11   No current facility-administered medications for this visit.    Facility-Administered Medications Ordered in Other Visits  Medication Dose Route Frequency Provider Last Rate Last Dose  . acetaminophen (TYLENOL) tablet 1,000 mg  1,000 mg Oral Once Amada Kingfisher, MD        Allergies: Aspirin, Iodinated diagnostic agents, Trazodone and nefazodone, Flagyl  [metronidazole], Iodine, and Morphine and related  Past Medical History:  Diagnosis Date  . ANEMIA, IRON DEFICIENCY 05/08/2009  . Angina   . ASYMPTOMATIC POSTMENOPAUSAL STATUS 10/11/2008  . Blood transfusion   . Blood transfusion without reported diagnosis   . Breast cancer ([place]) 09/29/11   invasive grade III ductal ca,assoc high grade dcis,ER/PR=neg  . C. difficile colitis   . Diverticulosis of colon (without mention of hemorrhage)   . Esophageal reflux 06/12/2008  . Gastroparesis   . GOITER, MULTINODULAR 04/02/2009  . Gout, unspecified   . H/O hiatal hernia   . History of lower GI bleeding   . History of radiation therapy 02/08/12-03/25/12   left breast,total 61gy  . Hypokalemia 05/11/2013  . Hypomagnesemia   . HYPOTHYROIDISM, POST-RADIATION 08/13/2009  . Internal hemorrhoids without mention of complication   . Kidney stones    "several"  . Leukopenia   .  Migraines   . Obesity   . Osteoarthrosis, unspecified whether generalized or localized, unspecified site   . Other and unspecified hyperlipidemia   . Personal history of chemotherapy 2013  . Personal history of radiation therapy 2013   left  . PONV (postoperative nausea and vomiting)   . PUD (peptic ulcer disease)   . Short bowel syndrome   . Shortness of breath on exertion    "sometimes"  . Stricture and stenosis of esophagus   . Thyrotoxicosis without mention of goiter or other cause, without mention of thyrotoxic crisis or storm   . Type II or unspecified type diabetes mellitus without mention of complication, not stated as uncontrolled    no med in years diet controled  . Unspecified essential hypertension   . UTI (urinary tract infection)   . Varicose veins   . VITAMIN B12 DEFICIENCY 08/30/2009    Past Surgical History:  Procedure Laterality Date  . ABDOMINAL ADHESION SURGERY  1980's thru 1990's   "several"  . ABDOMINAL HYSTERECTOMY  1970's   with BSO  . BREAST BIOPSY Left 08/13/11   left breast lower inner  quadrant  . BREAST BIOPSY Right 1985   Rt exc bx, benign  . BREAST LUMPECTOMY Left 08/2011  . BREAST LUMPECTOMY W/ NEEDLE LOCALIZATION  09/29/11   left  breast=lymph node,excision benign/ ER/PR=neg, her 2 Positive  . BUNIONECTOMY  1970's   bilateral  . CHOLECYSTECTOMY  1990's  . COLON SURGERY     "several surgeries for short bowel syndrome"  . COLONOSCOPY  2012   multiple   . DILATION AND CURETTAGE OF UTERUS    . ESOPHAGOGASTRODUODENOSCOPY  2011   multiple   . EXCISIONAL HEMORRHOIDECTOMY  11/10/2016  . EYE SURGERY     "long time ago"  . FLEXIBLE SIGMOIDOSCOPY  2011   multiple   . KIDNEY STONE SURGERY  1990's   "tried to go up & get it but pushed it further up"  . LITHOTRIPSY     "4 or 5 times"  . MASTECTOMY W/ NODES PARTIAL  09/29/11   left  . PORT-A-CATH REMOVAL Right 12/19/2013   Procedure: MINOR REMOVAL PORT-A-CATH;  Surgeon: Adin Hector, MD;  Location: [facility];  Service: General;  Laterality: Right;  . PORTACATH PLACEMENT  09/29/2011   Procedure: INSERTION PORT-A-CATH;  Surgeon: Adin Hector, MD;  Location: [facility];  Service: General;  Laterality: N/A;  . Thyroid Ultrasound  12/1994 and 12/1995  . TOTAL KNEE ARTHROPLASTY Right 06/05/2015   Procedure: RIGHT TOTAL KNEE ARTHROPLASTY;  Surgeon: Ninetta Lights, MD;  Location: [facility];  Service: Orthopedics;  Laterality: Right;  . VEIN LIGATION AND STRIPPING  1980's   Right leg    Family History  Problem Relation Age of Onset  . Esophageal cancer Son        deceased  . Diabetes Mother   . Heart disease Mother   . Kidney disease Sister   . Diabetes Father   . Hypertension Father   . Kidney disease Brother        x 3  . Colon cancer Paternal Uncle   . Breast cancer Maternal Aunt   . Breast cancer Paternal Aunt   . Breast cancer Cousin        Pt states she has 15+ cousins w/ Breast CA    Social History   Tobacco Use  . Smoking status: Former Smoker    Packs/day: 1.00    Years: 10.00  Pack  years: 10.00    Types: Cigarettes    Quit date: 09/22/1985    Years since quitting: 33.2  . Smokeless tobacco: Never Used  Substance Use Topics  . Alcohol use: No    Comment: 09/29/11 "used to drink socially years ago"    Subjective:  Presents with concerns for sore, itchy throat x 3 days; is concerned that her thyroid is swollen and is the source of the symptoms; has had radioactive iodine in the past- 2010; last thyroid ultrasound was done in 2016;  Notes that in the past when her thyroid has flared, she has experienced similar symptoms of hoarseness, itchy throat; last TSH was checked in 03/2018- normal; does have seasonal allergies but not currently taking any medication for symptom relief.      Objective:  Vitals:   12/27/18 1206  BP: 118/78  Pulse: 63  Temp: 97.8 F (36.6 C)  TempSrc: Oral  SpO2: 99%  Weight: 210 lb 1.3 oz (95.3 kg)  Height: 5\' 5"  (1.651 m)    General: Well developed, well nourished, in no acute distress  Skin : Warm and dry.  Head: Normocephalic and atraumatic  Eyes: Sclera and conjunctiva clear; pupils round and reactive to light; extraocular movements intact  Ears: External normal; canals clear; tympanic membranes normal  Oropharynx: Pink, supple. No suspicious lesions  Neck: Supple without thyromegaly, adenopathy  Lungs: Respirations unlabored;  Neurologic: Alert and oriented; speech intact; face symmetrical;   Assessment:  1. Post-nasal drainage   2. Hypothyroidism following radioiodine therapy     Plan:  Suspect symptoms are allergic in nature- trial of Claritin and Flonase; low suspicion for thyroid etiology based on appearance today; will check TSH today and determine need for ultrasound based on patient's response to treatment.   No follow-ups on file.  Orders Placed This Encounter  Procedures  . CBC w/Diff    Standing Status:   Future    Number of Occurrences:   1    Standing Expiration Date:   12/27/2019  . TSH    Standing Status:    Future    Number of Occurrences:   1    Standing Expiration Date:   12/27/2019    Requested Prescriptions   Signed Prescriptions Disp Refills  . fluticasone (FLONASE) 50 MCG/ACT nasal spray 16 g 6    Sig: Place 2 sprays into both nostrils daily.  Marland Kitchen loratadine (CLARITIN) 10 MG tablet 30 tablet 11    Sig: Take 1 tablet (10 mg total) by mouth daily.

## 2018-12-27 NOTE — Patient Instructions (Addendum)
Medication Instructions:   [facility] send new nitro sL script  If you need a refill on your cardiac medications before your next appointment, please call your pharmacy.   Lab work:  NOT NEEDED   Testing/Procedures: NOT NEEDED  Follow-Up: At [facility], you and your health needs are our priority.  As part of our continuing mission to provide you with exceptional heart care, we have created designated Provider Care Teams.  These Care Teams include your primary Cardiologist (physician) and Advanced Practice Providers (APPs -  Physician Assistants and Nurse Practitioners) who all work together to provide you with the care you need, when you need it. . You will need a follow up appointment in  6 months JAN 2021.  Please call our office 2 months in advance to schedule this appointment.  You may see Elouise Munroe, MD*or one of the following Advanced Practice Providers on your designated Care Team:   . Rosaria Ferries, PA-C . Jory Sims, DNP, ANP  Any Other Special Instructions Will Be Listed Below (If Applicable).

## 2018-12-27 NOTE — Telephone Encounter (Signed)
Patient coming in today for office visit. 

## 2018-12-27 NOTE — Telephone Encounter (Signed)
Patient states since the weekend she has noticed that her neck has been stiff with some swelling.  States that she has a scratchy, sore throat.  Would like to come into the office to be seen.  Patient states she does have diarrhea but states she has short bowel.  She denies any other symptoms.  Please advise.

## 2018-12-27 NOTE — Telephone Encounter (Signed)
I think it's fine to bring her in the office.

## 2019-01-23 ENCOUNTER — Other Ambulatory Visit: Payer: Self-pay | Admitting: Family

## 2019-01-23 ENCOUNTER — Telehealth: Payer: Self-pay | Admitting: Gastroenterology

## 2019-01-23 MED ORDER — METHOCARBAMOL 500 MG PO TABS: 500.0000 mg | ORAL_TABLET | Freq: Three times a day (TID) | ORAL | 0 refills | Status: DC | PRN

## 2019-01-23 MED ORDER — DIPHENOXYLATE-ATROPINE 2.5-0.025 MG PO TABS: ORAL_TABLET | ORAL | 0 refills | Status: DC

## 2019-01-23 NOTE — Telephone Encounter (Signed)
Medication: methocarbamol (ROBAXIN) 500 MG tablet     Patient is requesting a refill of this medication.    Pharmacy:  [facility] [place], [place] - [address] (Phone) [phone] (Fax)

## 2019-01-23 NOTE — Telephone Encounter (Signed)
Sent prescription to the pharmacy. Patient notified and she verbalized understanding.

## 2019-01-23 NOTE — Telephone Encounter (Signed)
Requested medication (s) are due for refill today: yes  Requested medication (s) are on the active medication list: yes  Last refill: 10/28/2018  Future visit scheduled: no  Notes to clinic:  This refill cannot be delegated   Requested Prescriptions  Pending Prescriptions Disp Refills   methocarbamol (ROBAXIN) 500 MG tablet 30 tablet 0    Sig: Take 1 tablet (500 mg total) by mouth every 8 (eight) hours as needed for muscle spasms.     Not Delegated - Analgesics:  Muscle Relaxants Failed - 01/23/2019 10:16 AM      Failed - This refill cannot be delegated      Passed - Valid encounter within last 6 months    Recent Outpatient Visits          3 weeks ago Post-nasal drainage   [place], [facility], [place]   6 months ago Acute pain of right shoulder   [place], [facility], [place]   9 months ago Neck pain   [place], [facility], [place]   10 months ago Hypomagnesemia   [place], [facility], [place]   4 years ago Acute cystitis with hematuria   [facility] -Mayer Camel, MD

## 2019-02-07 ENCOUNTER — Other Ambulatory Visit: Payer: Self-pay | Admitting: Family

## 2019-02-07 ENCOUNTER — Encounter: Payer: Self-pay | Admitting: Family

## 2019-02-07 ENCOUNTER — Ambulatory Visit (INDEPENDENT_AMBULATORY_CARE_PROVIDER_SITE_OTHER): Payer: Medicare Other | Admitting: Family

## 2019-02-07 ENCOUNTER — Other Ambulatory Visit: Payer: Self-pay

## 2019-02-07 ENCOUNTER — Ambulatory Visit (INDEPENDENT_AMBULATORY_CARE_PROVIDER_SITE_OTHER)
Admission: RE | Admit: 2019-02-07 | Discharge: 2019-02-07 | Disposition: A | Discharge status: Home / Self Care | Payer: Medicare Other | Source: Ambulatory Visit | Attending: Family | Admitting: Family

## 2019-02-07 ENCOUNTER — Ambulatory Visit: Payer: Self-pay | Admitting: *Deleted

## 2019-02-07 ENCOUNTER — Other Ambulatory Visit (INDEPENDENT_AMBULATORY_CARE_PROVIDER_SITE_OTHER): Payer: Medicare Other

## 2019-02-07 VITALS — BP 112/78 | HR 71 | Temp 98.0°F | Ht 65.0 in | Wt 214.6 lb

## 2019-02-07 DIAGNOSIS — N950 Postmenopausal bleeding: Secondary | ICD-10-CM

## 2019-02-07 DIAGNOSIS — R252 Cramp and spasm: Secondary | ICD-10-CM

## 2019-02-07 DIAGNOSIS — R600 Localized edema: Secondary | ICD-10-CM | POA: Diagnosis not present

## 2019-02-07 DIAGNOSIS — R109 Unspecified abdominal pain: Secondary | ICD-10-CM

## 2019-02-07 DIAGNOSIS — E538 Deficiency of other specified B group vitamins: Secondary | ICD-10-CM

## 2019-02-07 DIAGNOSIS — Z23 Encounter for immunization: Secondary | ICD-10-CM | POA: Diagnosis not present

## 2019-02-07 LAB — COMPREHENSIVE METABOLIC PANEL
ALT: 33 U/L (ref 0–35)
AST: 33 U/L (ref 0–37)
Albumin: 3.9 g/dL (ref 3.5–5.2)
Alkaline Phosphatase: 55 U/L (ref 39–117)
BUN: 17 mg/dL (ref 6–23)
CO2: 27 mEq/L (ref 19–32)
Calcium: 8.3 mg/dL — ABNORMAL LOW (ref 8.4–10.5)
Chloride: 105 mEq/L (ref 96–112)
Creatinine, Ser: 1.04 mg/dL (ref 0.40–1.20)
GFR: 62.53 mL/min (ref >60.00)
Glucose, Bld: 86 mg/dL (ref 70–99)
Potassium: 3.5 mEq/L (ref 3.5–5.1)
Sodium: 143 mEq/L (ref 135–145)
Total Bilirubin: 0.5 mg/dL (ref 0.2–1.2)
Total Protein: 7 g/dL (ref 6.0–8.3)

## 2019-02-07 LAB — CBC WITH DIFFERENTIAL/PLATELET
Basophils Absolute: 0 10*3/uL (ref 0.0–0.1)
Basophils Relative: 0.4 % (ref 0.0–3.0)
Eosinophils Absolute: 0.1 10*3/uL (ref 0.0–0.7)
Eosinophils Relative: 1.2 % (ref 0.0–5.0)
HCT: 31.6 % — ABNORMAL LOW (ref 36.0–46.0)
Hemoglobin: 10.4 g/dL — ABNORMAL LOW (ref 12.0–15.0)
Lymphocytes Relative: 38.2 % (ref 12.0–46.0)
Lymphs Abs: 1.6 10*3/uL (ref 0.7–4.0)
MCHC: 32.9 g/dL (ref 30.0–36.0)
MCV: 92.4 fl (ref 78.0–100.0)
Monocytes Absolute: 0.4 10*3/uL (ref 0.1–1.0)
Monocytes Relative: 8.7 % (ref 3.0–12.0)
Neutro Abs: 2.2 10*3/uL (ref 1.4–7.7)
Neutrophils Relative %: 51.5 % (ref 43.0–77.0)
Platelets: 239 10*3/uL (ref 150.0–400.0)
RBC: 3.42 Mil/uL — ABNORMAL LOW (ref 3.87–5.11)
RDW: 14 % (ref 11.5–15.5)
WBC: 4.2 10*3/uL (ref 4.0–10.5)

## 2019-02-07 LAB — MAGNESIUM: Magnesium: 0.8 mg/dL — CL (ref 1.5–2.5)

## 2019-02-07 LAB — BRAIN NATRIURETIC PEPTIDE: Pro B Natriuretic peptide (BNP): 115 pg/mL — ABNORMAL HIGH (ref 0.0–100.0)

## 2019-02-07 LAB — VITAMIN B12: Vitamin B-12: >1500 pg/mL — ABNORMAL HIGH (ref 211–911)

## 2019-02-07 MED ORDER — FUROSEMIDE 20 MG PO TABS: 20.0000 mg | ORAL_TABLET | Freq: Every day | ORAL | 1 refills | Status: DC | PRN

## 2019-02-07 MED ORDER — MAGNESIUM OXIDE 400 MG PO CAPS: ORAL_CAPSULE | ORAL | 0 refills | Status: DC

## 2019-02-07 NOTE — Addendum Note (Signed)
Addended by: Marcina Millard on: 02/07/2019 10:46 AM   Modules accepted: Orders

## 2019-02-07 NOTE — Progress Notes (Signed)
[name] is a 73 y.o. female with the following history as recorded in EpicCare:  Patient Active Problem List   Diagnosis Date Noted  . Dyspnea 02/16/2018  . 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 UTI (urinary tract infection) 11/27/2014  . UTI (lower urinary tract infection) 11/27/2014  . Acute cystitis with hematuria 11/13/2014  . Hemorrhoids, external 11/04/2014  . Wellness examination 10/03/2014  . Pain in joint, shoulder region 09/24/2014  . Cramp in limb 06/26/2014  . Rectal bleeding 05/02/2014  . External hemorrhoids 05/02/2014  . Hemorrhoids without complication 56/31/4970  . Pain in joint, lower leg 02/01/2014  . Vitamin D deficiency 01/16/2014  . Gastroparesis 09/14/2013  . Other dysphagia 09/04/2013  . Nausea alone 09/04/2013  . Iron deficiency anemia 08/28/2013  . Hypomagnesemia 05/11/2013  . Hypokalemia 05/11/2013  . Generalized weakness 05/11/2013  . Dehydration 11/25/2011  . Fatigue 11/16/2011  . Breast cancer ([place]) 09/29/2011  . Family history of breast cancer 08/19/2011  . Primary cancer of lower-inner quadrant of left female breast ([place]) 08/17/2011  . Neck pain 07/29/2011  . Routine general medical examination at a health care facility 06/28/2011  . Edema 11/26/2010  . CLOSTRIDIUM DIFFICILE COLITIS 02/07/2010  . Blind loop syndrome 10/01/2009  . Abdominal pain 10/01/2009  . Vitamin B 12 deficiency 08/30/2009  . 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 Rash and nonspecific skin eruption 08/13/2009  . GOITER, MULTINODULAR 04/02/2009  . CONTACT DERMATITIS&OTHER ECZEMA DUE UNSPEC CAUSE 02/20/2009  . URINARY CALCULUS 10/11/2008  . Asymptomatic menopausal state 10/11/2008  . BACK PAIN, CHRONIC 07/03/2008  . Esophageal reflux 06/12/2008  . Diarrhea 06/12/2008  . ABDOMINAL PAIN-RUQ 05/17/2008  . Diabetes ([place]) 12/10/2006  . Dyslipidemia 12/10/2006  . Gout 12/10/2006  . HYPERTENSION 12/10/2006  . DIVERTICULOSIS,  COLON 12/10/2006  . OSTEOARTHRITIS 12/10/2006  . ESOPHAGEAL STRICTURE 08/23/2002  . HIATAL HERNIA 08/23/2002  . INTERNAL HEMORRHOIDS 02/02/2001    Current Outpatient Medications  Medication Sig Dispense Refill  . allopurinol (ZYLOPRIM) 100 MG tablet Take 1 tablet (100 mg total) by mouth daily. 90 tablet 3  . aMILoride (MIDAMOR) 5 MG tablet Take 1 tablet (5 mg total) by mouth 2 (two) times daily. 180 tablet 3  . amitriptyline (ELAVIL) 50 MG tablet Take 1 tablet (50 mg total) by mouth at bedtime. 90 tablet 0  . busPIRone (BUSPAR) 15 MG tablet Take 1 tablet (15 mg total) by mouth 2 (two) times daily. 90 tablet 3  . calcium carbonate (TUMS - DOSED IN MG ELEMENTAL CALCIUM) 500 MG chewable tablet Chew 1 tablet by mouth as needed for indigestion or heartburn.    . Calcium Carbonate-Vitamin D (CALCIUM-VITAMIN D) 600-200 MG-UNIT CAPS Take 1 capsule by mouth daily.      . cholestyramine (QUESTRAN) 4 g packet Take 1 packet in juice or water by mouth twice daily. 180 each 1  . Cyanocobalamin (VITAMIN B 12 PO) Take 1 tablet by mouth 2 (two) times daily.     Marland Kitchen dicyclomine (BENTYL) 20 MG tablet Take 1 tablet (20 mg total) by mouth 4 (four) times daily -  before meals and at bedtime. 360 tablet 3  . diphenoxylate-atropine (LOMOTIL) 2.5-0.025 MG tablet Take 1 tablet by mouth 3 times daily as needed for diarrhea. 270 tablet 0  . famotidine (PEPCID) 40 MG tablet Take 1 tablet (40 mg total) by mouth at bedtime. 90 tablet 1  . fluticasone (FLONASE) 50 MCG/ACT nasal spray Place 2 sprays into both nostrils daily. 16 g  6  . gabapentin (NEURONTIN) 300 MG capsule Take 2 capsules (600 mg total) by mouth 2 (two) times daily. 360 capsule 3  . HYDROcodone-acetaminophen (NORCO) 7.5-325 MG tablet Take 1 tablet by mouth every 6 (six) hours as needed for moderate pain. 30 tablet 0  . levothyroxine (SYNTHROID, LEVOTHROID) 200 MCG tablet Take 1 tablet (200 mcg total) by mouth daily before breakfast. 90 tablet 3  . Lidocaine,  Anorectal, 5 % GEL Apply a pea-sized amount to the affected area 3 times daily. 30 g 0  . loratadine (CLARITIN) 10 MG tablet Take 1 tablet (10 mg total) by mouth daily. 30 tablet 11  . lovastatin (MEVACOR) 20 MG tablet TAKE 1 TABLET BY MOUTH  DAILY AT 6 PM. 90 tablet 2  . Magnesium Chloride 64 MG TBEC Take 64 mg by mouth daily.    . methocarbamol (ROBAXIN) 500 MG tablet Take 1 tablet (500 mg total) by mouth every 8 (eight) hours as needed for muscle spasms. 30 tablet 0  . metoprolol tartrate (LOPRESSOR) 50 MG tablet Take 1 tablet (35m) 2 hours prior to your Cardiac CT 1 tablet 0  . nitroGLYCERIN (NITROSTAT) 0.4 MG SL tablet Place 1 tablet (0.4 mg total) under the tongue every 5 (five) minutes as needed. 25 tablet 6  . ondansetron (ZOFRAN-ODT) 4 MG disintegrating tablet Take 1 tablet (4 mg total) by mouth every 8 (eight) hours as needed. for nausea 90 tablet 0  . pantoprazole (PROTONIX) 40 MG tablet TAKE 1 TABLET BY MOUTH 30 MINUTES PRIOR TO BREAKFAST AND SUPPER 60 tablet 11  . predniSONE (DELTASONE) 50 MG tablet Take 1 tab (587m 13 hours prior to test,Take 1 tab (5083m7 hours prior to test, Take 1 tab (72m30m hours prior to test 3 tablet 0  . rifaximin (XIFAXAN) 550 MG TABS tablet Take 1 tablet by mouth 3 times daily for 14 days. 42 tablet 0  . Thiamine HCl (THIAMINE PO) Take by mouth.    . amMarland Kitchentriptyline (ELAVIL) 10 MG tablet Take 1 tablet by mouth at bedtime x 1 week, then increase to 2.5 tablets by mouth x 1 week, then pick up rx for 50 mg tablets to remain on that dosage (Patient not taking: Reported on 12/27/2018) 25 tablet 0  . furosemide (LASIX) 20 MG tablet Take 1 tablet (20 mg total) by mouth daily as needed for edema. 30 tablet 1   No current facility-administered medications for this visit.    Facility-Administered Medications Ordered in Other Visits  Medication Dose Route Frequency Provider Last Rate Last Dose  . acetaminophen (TYLENOL) tablet 1,000 mg  1,000 mg Oral Once FaidAmada Kingfisher        Allergies: Aspirin, Iodinated diagnostic agents, Trazodone and nefazodone, Flagyl [metronidazole], Iodine, and Morphine and related  Past Medical History:  Diagnosis Date  . ANEMIA, IRON DEFICIENCY 05/08/2009  . Angina   . ASYMPTOMATIC POSTMENOPAUSAL STATUS 10/11/2008  . Blood transfusion   . Blood transfusion without reported diagnosis   . Breast cancer ([facility])[place]30/13   invasive grade III ductal ca,assoc high grade dcis,ER/PR=neg  . C. difficile colitis   . Diverticulosis of colon (without mention of hemorrhage)   . Esophageal reflux 06/12/2008  . Gastroparesis   . GOITER, MULTINODULAR 04/02/2009  . Gout, unspecified   . H/O hiatal hernia   . History of lower GI bleeding   . History of radiation therapy 02/08/12-03/25/12   left breast,total 61gy  . Hypokalemia 05/11/2013  . Hypomagnesemia   . 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 Internal hemorrhoids without mention of complication   . Kidney stones    "several"  . Leukopenia   . Migraines   . Obesity   . Osteoarthrosis, unspecified whether generalized or localized, unspecified site   . Other and unspecified hyperlipidemia   . Personal history of chemotherapy 2013  . Personal history of radiation therapy 2013   left  . PONV (postoperative nausea and vomiting)   . PUD (peptic ulcer disease)   . Short bowel syndrome   . Shortness of breath on exertion    "sometimes"  . Stricture and stenosis of esophagus   . Thyrotoxicosis without mention of goiter or other cause, without mention of thyrotoxic crisis or storm   . Type II or unspecified type diabetes mellitus without mention of complication, not stated as uncontrolled    no med in years diet controled  . Unspecified essential hypertension   . UTI (urinary tract infection)   . Varicose veins   . VITAMIN B12 DEFICIENCY 08/30/2009    Past Surgical History:  Procedure Laterality Date  . ABDOMINAL ADHESION SURGERY  1980's thru 1990's   "several"  .  ABDOMINAL HYSTERECTOMY  1970's   with BSO  . BREAST BIOPSY Left 08/13/11   left breast lower inner quadrant  . BREAST BIOPSY Right 1985   Rt exc bx, benign  . BREAST LUMPECTOMY Left 08/2011  . BREAST LUMPECTOMY W/ NEEDLE LOCALIZATION  09/29/11   left  breast=lymph node,excision benign/ ER/PR=neg, her 2 Positive  . BUNIONECTOMY  1970's   bilateral  . CHOLECYSTECTOMY  1990's  . COLON SURGERY     "several surgeries for short bowel syndrome"  . COLONOSCOPY  2012   multiple   . DILATION AND CURETTAGE OF UTERUS    . ESOPHAGOGASTRODUODENOSCOPY  2011   multiple   . EXCISIONAL HEMORRHOIDECTOMY  11/10/2016  . EYE SURGERY     "long time ago"  . FLEXIBLE SIGMOIDOSCOPY  2011   multiple   . KIDNEY STONE SURGERY  1990's   "tried to go up & get it but pushed it further up"  . LITHOTRIPSY     "4 or 5 times"  . MASTECTOMY W/ NODES PARTIAL  09/29/11   left  . PORT-A-CATH REMOVAL Right 12/19/2013   Procedure: MINOR REMOVAL PORT-A-CATH;  Surgeon: Adin Hector, MD;  Location: [facility];  Service: General;  Laterality: Right;  . PORTACATH PLACEMENT  09/29/2011   Procedure: INSERTION PORT-A-CATH;  Surgeon: Adin Hector, MD;  Location: [facility];  Service: General;  Laterality: N/A;  . Thyroid Ultrasound  12/1994 and 12/1995  . TOTAL KNEE ARTHROPLASTY Right 06/05/2015   Procedure: RIGHT TOTAL KNEE ARTHROPLASTY;  Surgeon: Ninetta Lights, MD;  Location: [facility];  Service: Orthopedics;  Laterality: Right;  . VEIN LIGATION AND STRIPPING  1980's   Right leg    Family History  Problem Relation Age of Onset  . Esophageal cancer Son        deceased  . Diabetes Mother   . Heart disease Mother   . Kidney disease Sister   . Diabetes Father   . Hypertension Father   . Kidney disease Brother        x 3  . Colon cancer Paternal Uncle   . Breast cancer Maternal Aunt   . Breast cancer Paternal Aunt   . Breast cancer Cousin        Pt states she has 15+ cousins w/ Breast CA  Social History    Tobacco Use  . Smoking status: Former Smoker    Packs/day: 1.00    Years: 10.00    Pack years: 10.00    Types: Cigarettes    Quit date: 09/22/1985    Years since quitting: 33.4  . Smokeless tobacco: Never Used  Substance Use Topics  . Alcohol use: No    Comment: 09/29/11 "used to drink socially years ago"    Subjective:  Patient complaining of vaginal spotting/ pelvic pain x 2 weeks; did have intercourse 2 weeks ago prior to onset of symptoms; patient noticed a thick vaginal discharge and opted to use a vaginal douche on Friday; felt that the bleeding was worse after using the douche- noticed that blood was "dark" on Friday evening; also complaining of accompanying "sharp pain." Total hysterectomy was done in the 1970s due to ovarian fibroids;  Also complaining of recurrent history of swelling in her lower extremities; notes symptoms have been present "for years." Has taken a fluid pill in the past- does not remember the name; has not spoken to her cardiologist about the symptoms;    Objective:  Vitals:   02/07/19 0956  BP: 112/78  Pulse: 71  Temp: 98 F (36.7 C)  TempSrc: Oral  SpO2: 99%  Weight: 214 lb 9.6 oz (97.3 kg)  Height: _0  (1.651 m)    General: Well developed, well nourished, in no acute distress  Skin : Warm and dry.  Head: Normocephalic and atraumatic  Eyes: Sclera and conjunctiva clear; pupils round and reactive to light; extraocular movements intact  Ears: External normal; canals clear; tympanic membranes normal  Oropharynx: Pink, supple. No suspicious lesions  Neck: Supple without thyromegaly, adenopathy  Lungs: Respirations unlabored; clear to auscultation bilaterally without wheeze, rales, rhonchi  CVS exam: normal rate and regular rhythm.   Musculoskeletal: No deformities; no active joint inflammation  Extremities: No pitting edema, cyanosis, clubbing  Vessels: Symmetric bilaterally  Neurologic: Alert and oriented; speech intact; face symmetrical;  moves all extremities well; CNII-XII intact without focal deficit  Pelvic exam- patient defers/ just wants to see GYN  Assessment:  1. Post-menopausal bleeding   2. Abdominal pain, unspecified abdominal location   3. Pedal edema   4. Leg cramps   5. B12 deficiency     Plan:  1. & 2. Update abdominal/ pelvic CT; patient defers GYN exam; refer to GYN for further evaluation; encouraged to stop using douches; 3. Check BNP, CXR today; trial of Lasix 20 mg qd prn; 4. & 5. Check B12 and magnesium level today;   Flu vaccine is given today;   No follow-ups on file.  Orders Placed This Encounter  Procedures  . CT Abdomen Pelvis W Contrast    Standing Status:   Future    Standing Expiration Date:   05/08/2020    Order Specific Question:   ** REASON FOR EXAM (FREE TEXT)    Answer:   bilateral pedal edema/ post- menopausal bleeding    Order Specific Question:   If indicated for the ordered procedure, I authorize the administration of contrast media per Radiology protocol    Answer:   Yes    Order Specific Question:   Preferred imaging location?    Answer:   [facility]    Order Specific Question:   Is Oral Contrast requested for this exam?    Answer:   Yes, Per Radiology protocol    Order Specific Question:   Radiology Contrast Protocol - do NOT remove  file path    Answer:   \\[email or web address].pdf  . DG Chest 2 View    Standing Status:   Future    Standing Expiration Date:   04/08/2020    Order Specific Question:   Reason for Exam (SYMPTOM  OR DIAGNOSIS REQUIRED)    Answer:   pedal edema    Order Specific Question:   Preferred imaging location?    Answer:   [facility]    Order Specific Question:   Radiology Contrast Protocol - do NOT remove file path    Answer:   \\[email or web address].[email or web address]  . CBC w/Diff    Standing Status:   Future    Standing Expiration Date:   02/07/2020  . Comp Met (CMET)    Standing Status:   Future     Standing Expiration Date:   02/07/2020  . B Nat Peptide    Standing Status:   Future    Standing Expiration Date:   02/07/2020  . Magnesium    Standing Status:   Future    Standing Expiration Date:   02/07/2020  . B12    Standing Status:   Future    Standing Expiration Date:   02/07/2020  . Ambulatory referral to Gynecology    Referral Priority:   Routine    Referral Type:   Consultation    Referral Reason:   Specialty Services Required    Requested Specialty:   Gynecology    Number of Visits Requested:   1    Requested Prescriptions   Signed Prescriptions Disp Refills  . furosemide (LASIX) 20 MG tablet 30 tablet 1    Sig: Take 1 tablet (20 mg total) by mouth daily as needed for edema.

## 2019-02-07 NOTE — Addendum Note (Signed)
Addended by: Marcina Millard on: 02/07/2019 11:07 AM   Modules accepted: Orders

## 2019-02-07 NOTE — Telephone Encounter (Signed)
Pt called with complaints of vaginal bleeding after douching (spotting bright red progressing to dark red); this has happen also occurred 2 weeks ago when she had sex; she had a complete hysterectomy in the 70s; she complains of abdominal pain below her navel rated 5 out of 10; the pt says that she has swelling of her ankles and feet which she had previously spoken to her provider; recommendations made per nurse triage protocol; she verbalized understanding; she sees Jodi Mourning, [facility], and would like to be seen today; pt transferred to [facility] for scheduling.  Reason for Disposition . [1] Bleeding/spotting after procedure (e.g., biopsy) or pelvic examination (e.g., pap smear) AND [2] lasts > 7 days  Answer Assessment - Initial Assessment Questions 1. AMOUNT: "Describe the bleeding that you are having." "How much bleeding is there?"    - SPOTTING: spotting, or pinkish / brownish mucous discharge; does not fill panti-liner or pad    - MILD:  less than 1 pad / hour; less than patient's  menstrual bleeding when she still had menstrual periods   - MODERATE: 1-2 pads / hour; small-medium blood clots (e.g., pea, grape, small coin)    - SEVERE: soaking 2 or more pads/hour for 2 or more hours; bleeding not contained by pads or continuous red blood from vagina; large blood clots (e.g., golf ball, large coin)      spotting 2. ONSET: "When did the bleeding begin?" "Is it continuing now?"   02/04/2019 3. MENOPAUSE: "When was your last menstrual period?"      Yes hysterectomy 1970s 4. ABDOMINAL PAIN: "Do you have any pain?" "How bad is the pain?"  (e.g., Scale 1-10; mild, moderate, or severe)   - MILD (1-3): doesn't interfere with normal activities, abdomen soft and not tender to touch    - MODERATE (4-7): interferes with normal activities or awakens from sleep, tender to touch    - SEVERE (8-10): excruciating pain, doubled over, unable to do any normal activities     5 out of 10 5. BLOOD THINNERS: "Do you  take any blood thinners?" (e.g., Coumadin/warfarin, Pradaxa/dabigatran, aspirin)     no 6. HORMONES: "Are you taking any hormone medications, prescription or OTC?" (e.g., birth control pills, estrogen)    no 7. CAUSE: "What do you think is causing the bleeding?" (e.g., recent gyn surgery, recent gyn procedure; known bleeding disorder, uterine cancer)      Douching and sex 42. HEMODYNAMIC STATUS: "Are you weak or feeling lightheaded?" If so, ask: "Can you stand and walk normally?"     no 9. OTHER SYMPTOMS: "What other symptoms are you having with the bleeding?" (e.g., back pain, burning with urination, fever)  vaginal discharge increased, creamy, no odor  Protocols used: VAGINAL BLEEDING - POSTMENOPAUSAL-A-AH

## 2019-02-08 ENCOUNTER — Other Ambulatory Visit: Payer: Self-pay

## 2019-02-08 ENCOUNTER — Other Ambulatory Visit (INDEPENDENT_AMBULATORY_CARE_PROVIDER_SITE_OTHER): Payer: Medicare Other

## 2019-02-08 ENCOUNTER — Encounter (HOSPITAL_COMMUNITY): Payer: Self-pay

## 2019-02-08 ENCOUNTER — Telehealth: Payer: Self-pay

## 2019-02-08 ENCOUNTER — Inpatient Hospital Stay (HOSPITAL_COMMUNITY)
Admission: EM | Admit: 2019-02-08 | Discharge: 2019-02-14 | DRG: 392 | Disposition: A | Discharge status: Home / Self Care | Payer: Medicare Other | Attending: Internal Medicine | Admitting: Internal Medicine

## 2019-02-08 DIAGNOSIS — Z853 Personal history of malignant neoplasm of breast: Secondary | ICD-10-CM | POA: Diagnosis not present

## 2019-02-08 DIAGNOSIS — Z803 Family history of malignant neoplasm of breast: Secondary | ICD-10-CM

## 2019-02-08 DIAGNOSIS — R109 Unspecified abdominal pain: Secondary | ICD-10-CM | POA: Diagnosis present

## 2019-02-08 DIAGNOSIS — K912 Postsurgical malabsorption, not elsewhere classified: Secondary | ICD-10-CM | POA: Diagnosis not present

## 2019-02-08 DIAGNOSIS — M1A9XX0 Chronic gout, unspecified, without tophus (tophi): Secondary | ICD-10-CM

## 2019-02-08 DIAGNOSIS — Z87891 Personal history of nicotine dependence: Secondary | ICD-10-CM

## 2019-02-08 DIAGNOSIS — Z800 Family history of malignant neoplasm of digestive organs: Secondary | ICD-10-CM

## 2019-02-08 DIAGNOSIS — K3184 Gastroparesis: Secondary | ICD-10-CM | POA: Diagnosis present

## 2019-02-08 DIAGNOSIS — N289 Disorder of kidney and ureter, unspecified: Secondary | ICD-10-CM | POA: Diagnosis present

## 2019-02-08 DIAGNOSIS — Z6835 Body mass index (BMI) 35.0-35.9, adult: Secondary | ICD-10-CM

## 2019-02-08 DIAGNOSIS — Z20828 Contact with and (suspected) exposure to other viral communicable diseases: Secondary | ICD-10-CM | POA: Diagnosis not present

## 2019-02-08 DIAGNOSIS — R531 Weakness: Secondary | ICD-10-CM | POA: Diagnosis not present

## 2019-02-08 DIAGNOSIS — M1990 Unspecified osteoarthritis, unspecified site: Secondary | ICD-10-CM | POA: Diagnosis present

## 2019-02-08 DIAGNOSIS — K210 Gastro-esophageal reflux disease with esophagitis: Secondary | ICD-10-CM | POA: Diagnosis not present

## 2019-02-08 DIAGNOSIS — R609 Edema, unspecified: Secondary | ICD-10-CM | POA: Diagnosis not present

## 2019-02-08 DIAGNOSIS — R1013 Epigastric pain: Secondary | ICD-10-CM

## 2019-02-08 DIAGNOSIS — K5900 Constipation, unspecified: Secondary | ICD-10-CM

## 2019-02-08 DIAGNOSIS — Z91041 Radiographic dye allergy status: Secondary | ICD-10-CM

## 2019-02-08 DIAGNOSIS — E785 Hyperlipidemia, unspecified: Secondary | ICD-10-CM | POA: Diagnosis not present

## 2019-02-08 DIAGNOSIS — E1143 Type 2 diabetes mellitus with diabetic autonomic (poly)neuropathy: Secondary | ICD-10-CM | POA: Diagnosis present

## 2019-02-08 DIAGNOSIS — Z8711 Personal history of peptic ulcer disease: Secondary | ICD-10-CM

## 2019-02-08 DIAGNOSIS — R197 Diarrhea, unspecified: Secondary | ICD-10-CM | POA: Diagnosis not present

## 2019-02-08 DIAGNOSIS — Z841 Family history of disorders of kidney and ureter: Secondary | ICD-10-CM

## 2019-02-08 DIAGNOSIS — Z885 Allergy status to narcotic agent status: Secondary | ICD-10-CM

## 2019-02-08 DIAGNOSIS — R252 Cramp and spasm: Secondary | ICD-10-CM

## 2019-02-08 DIAGNOSIS — Z90710 Acquired absence of both cervix and uterus: Secondary | ICD-10-CM

## 2019-02-08 DIAGNOSIS — D509 Iron deficiency anemia, unspecified: Secondary | ICD-10-CM | POA: Diagnosis not present

## 2019-02-08 DIAGNOSIS — Z7951 Long term (current) use of inhaled steroids: Secondary | ICD-10-CM

## 2019-02-08 DIAGNOSIS — Z888 Allergy status to other drugs, medicaments and biological substances status: Secondary | ICD-10-CM

## 2019-02-08 DIAGNOSIS — R0602 Shortness of breath: Secondary | ICD-10-CM

## 2019-02-08 DIAGNOSIS — C50312 Malignant neoplasm of lower-inner quadrant of left female breast: Secondary | ICD-10-CM | POA: Diagnosis not present

## 2019-02-08 DIAGNOSIS — E1169 Type 2 diabetes mellitus with other specified complication: Secondary | ICD-10-CM | POA: Diagnosis present

## 2019-02-08 DIAGNOSIS — K5730 Diverticulosis of large intestine without perforation or abscess without bleeding: Secondary | ICD-10-CM | POA: Diagnosis not present

## 2019-02-08 DIAGNOSIS — K219 Gastro-esophageal reflux disease without esophagitis: Secondary | ICD-10-CM | POA: Diagnosis present

## 2019-02-08 DIAGNOSIS — E890 Postprocedural hypothyroidism: Secondary | ICD-10-CM | POA: Diagnosis not present

## 2019-02-08 DIAGNOSIS — R112 Nausea with vomiting, unspecified: Secondary | ICD-10-CM

## 2019-02-08 DIAGNOSIS — Z9221 Personal history of antineoplastic chemotherapy: Secondary | ICD-10-CM

## 2019-02-08 DIAGNOSIS — Z833 Family history of diabetes mellitus: Secondary | ICD-10-CM

## 2019-02-08 DIAGNOSIS — Z79890 Hormone replacement therapy (postmenopausal): Secondary | ICD-10-CM

## 2019-02-08 DIAGNOSIS — I10 Essential (primary) hypertension: Secondary | ICD-10-CM | POA: Diagnosis present

## 2019-02-08 DIAGNOSIS — E876 Hypokalemia: Secondary | ICD-10-CM | POA: Diagnosis not present

## 2019-02-08 DIAGNOSIS — Z8249 Family history of ischemic heart disease and other diseases of the circulatory system: Secondary | ICD-10-CM

## 2019-02-08 DIAGNOSIS — Z923 Personal history of irradiation: Secondary | ICD-10-CM

## 2019-02-08 DIAGNOSIS — M109 Gout, unspecified: Secondary | ICD-10-CM | POA: Diagnosis present

## 2019-02-08 DIAGNOSIS — Z96651 Presence of right artificial knee joint: Secondary | ICD-10-CM | POA: Diagnosis present

## 2019-02-08 DIAGNOSIS — E11649 Type 2 diabetes mellitus with hypoglycemia without coma: Secondary | ICD-10-CM | POA: Diagnosis present

## 2019-02-08 DIAGNOSIS — K90829 Short bowel syndrome, unspecified: Secondary | ICD-10-CM

## 2019-02-08 DIAGNOSIS — E538 Deficiency of other specified B group vitamins: Secondary | ICD-10-CM | POA: Diagnosis present

## 2019-02-08 DIAGNOSIS — R140 Abdominal distension (gaseous): Secondary | ICD-10-CM

## 2019-02-08 DIAGNOSIS — R51 Headache: Secondary | ICD-10-CM | POA: Diagnosis not present

## 2019-02-08 DIAGNOSIS — Z886 Allergy status to analgesic agent status: Secondary | ICD-10-CM

## 2019-02-08 DIAGNOSIS — E669 Obesity, unspecified: Secondary | ICD-10-CM | POA: Diagnosis present

## 2019-02-08 LAB — URINALYSIS, ROUTINE W REFLEX MICROSCOPIC
Bilirubin Urine: NEGATIVE
Glucose, UA: NEGATIVE mg/dL
Ketones, ur: NEGATIVE mg/dL
Nitrite: NEGATIVE
Protein, ur: NEGATIVE mg/dL
Specific Gravity, Urine: 1.01 (ref 1.005–1.030)
pH: 5 (ref 5.0–8.0)

## 2019-02-08 LAB — COMPREHENSIVE METABOLIC PANEL
ALT: 41 U/L (ref 0–44)
AST: 51 U/L — ABNORMAL HIGH (ref 15–41)
Albumin: 4.2 g/dL (ref 3.5–5.0)
Alkaline Phosphatase: 62 U/L (ref 38–126)
Anion gap: 10 (ref 5–15)
BUN: 16 mg/dL (ref 8–23)
CO2: 26 mmol/L (ref 22–32)
Calcium: 8.2 mg/dL — ABNORMAL LOW (ref 8.9–10.3)
Chloride: 103 mmol/L (ref 98–111)
Creatinine, Ser: 1.1 mg/dL — ABNORMAL HIGH (ref 0.44–1.00)
GFR calc Af Amer: 57 mL/min — ABNORMAL LOW (ref >60)
GFR calc non Af Amer: 49 mL/min — ABNORMAL LOW (ref >60)
Glucose, Bld: 87 mg/dL (ref 70–99)
Potassium: 3.2 mmol/L — ABNORMAL LOW (ref 3.5–5.1)
Sodium: 139 mmol/L (ref 135–145)
Total Bilirubin: 0.7 mg/dL (ref 0.3–1.2)
Total Protein: 8 g/dL (ref 6.5–8.1)

## 2019-02-08 LAB — RAPID URINE DRUG SCREEN, HOSP PERFORMED
Amphetamines: NOT DETECTED
Barbiturates: NOT DETECTED
Benzodiazepines: NOT DETECTED
Cocaine: NOT DETECTED
Opiates: NOT DETECTED
Tetrahydrocannabinol: NOT DETECTED

## 2019-02-08 LAB — CBC WITH DIFFERENTIAL/PLATELET
Abs Immature Granulocytes: 0.01 10*3/uL (ref 0.00–0.07)
Basophils Absolute: 0 10*3/uL (ref 0.0–0.1)
Basophils Relative: 0 %
Eosinophils Absolute: 0.1 10*3/uL (ref 0.0–0.5)
Eosinophils Relative: 1 %
HCT: 33.8 % — ABNORMAL LOW (ref 36.0–46.0)
Hemoglobin: 10.9 g/dL — ABNORMAL LOW (ref 12.0–15.0)
Immature Granulocytes: 0 %
Lymphocytes Relative: 34 %
Lymphs Abs: 1.6 10*3/uL (ref 0.7–4.0)
MCH: 30.7 pg (ref 26.0–34.0)
MCHC: 32.2 g/dL (ref 30.0–36.0)
MCV: 95.2 fL (ref 80.0–100.0)
Monocytes Absolute: 0.4 10*3/uL (ref 0.1–1.0)
Monocytes Relative: 9 %
Neutro Abs: 2.5 10*3/uL (ref 1.7–7.7)
Neutrophils Relative %: 56 %
Platelets: 243 10*3/uL (ref 150–400)
RBC: 3.55 MIL/uL — ABNORMAL LOW (ref 3.87–5.11)
RDW: 13.8 % (ref 11.5–15.5)
WBC: 4.5 10*3/uL (ref 4.0–10.5)
nRBC: 0 % (ref 0.0–0.2)

## 2019-02-08 LAB — MAGNESIUM: Magnesium: 0.7 mg/dL — CL (ref 1.5–2.5)

## 2019-02-08 MED ORDER — NITROGLYCERIN 0.4 MG SL SUBL: 0.4000 mg | SUBLINGUAL_TABLET | SUBLINGUAL | Status: DC | PRN

## 2019-02-08 MED ORDER — ENOXAPARIN SODIUM 40 MG/0.4ML ~~LOC~~ SOLN
40.0000 mg | SUBCUTANEOUS | Status: DC
  Administered 2019-02-08 – 2019-02-13 (×6): 40 mg via SUBCUTANEOUS
  Filled 2019-02-08 (×6): qty 0.4

## 2019-02-08 MED ORDER — LORATADINE 10 MG PO TABS
10.0000 mg | ORAL_TABLET | Freq: Every day | ORAL | Status: DC
  Administered 2019-02-09 – 2019-02-11 (×3): 10 mg via ORAL
  Filled 2019-02-08 (×3): qty 1

## 2019-02-08 MED ORDER — HYDROCODONE-ACETAMINOPHEN 7.5-325 MG PO TABS
1.0000 | ORAL_TABLET | Freq: Four times a day (QID) | ORAL | Status: DC | PRN
  Administered 2019-02-08 – 2019-02-14 (×13): 1 via ORAL
  Filled 2019-02-08 (×14): qty 1

## 2019-02-08 MED ORDER — PANTOPRAZOLE SODIUM 40 MG PO TBEC
40.0000 mg | DELAYED_RELEASE_TABLET | Freq: Two times a day (BID) | ORAL | Status: DC
  Administered 2019-02-09 – 2019-02-14 (×11): 40 mg via ORAL
  Filled 2019-02-08 (×11): qty 1

## 2019-02-08 MED ORDER — SODIUM CHLORIDE 0.9 % IV SOLN
INTRAVENOUS | Status: DC
  Administered 2019-02-08 – 2019-02-09 (×2): via INTRAVENOUS

## 2019-02-08 MED ORDER — DICYCLOMINE HCL 20 MG PO TABS
20.0000 mg | ORAL_TABLET | Freq: Three times a day (TID) | ORAL | Status: DC
  Administered 2019-02-08 – 2019-02-11 (×9): 20 mg via ORAL
  Filled 2019-02-08 (×11): qty 1

## 2019-02-08 MED ORDER — POTASSIUM CHLORIDE 10 MEQ/100ML IV SOLN
10.0000 meq | INTRAVENOUS | Status: AC
  Administered 2019-02-08 (×3): 10 meq via INTRAVENOUS
  Filled 2019-02-08 (×3): qty 100

## 2019-02-08 MED ORDER — ONDANSETRON HCL 4 MG/2ML IJ SOLN
4.0000 mg | Freq: Four times a day (QID) | INTRAMUSCULAR | Status: DC | PRN
  Administered 2019-02-09 – 2019-02-13 (×7): 4 mg via INTRAVENOUS
  Filled 2019-02-08 (×7): qty 2

## 2019-02-08 MED ORDER — ONDANSETRON HCL 4 MG PO TABS
4.0000 mg | ORAL_TABLET | Freq: Four times a day (QID) | ORAL | Status: DC | PRN
  Administered 2019-02-10 – 2019-02-11 (×2): 4 mg via ORAL
  Filled 2019-02-08 (×2): qty 1

## 2019-02-08 MED ORDER — FLUTICASONE PROPIONATE 50 MCG/ACT NA SUSP
2.0000 | Freq: Every day | NASAL | Status: DC
  Administered 2019-02-10 – 2019-02-14 (×5): 2 via NASAL
  Filled 2019-02-08: qty 16

## 2019-02-08 MED ORDER — CALCIUM CARBONATE-VITAMIN D 500-200 MG-UNIT PO TABS
1.0000 | ORAL_TABLET | Freq: Every day | ORAL | Status: DC
  Administered 2019-02-09 – 2019-02-11 (×3): 1 via ORAL
  Filled 2019-02-08 (×3): qty 1

## 2019-02-08 MED ORDER — POTASSIUM CHLORIDE CRYS ER 20 MEQ PO TBCR
40.0000 meq | EXTENDED_RELEASE_TABLET | Freq: Two times a day (BID) | ORAL | Status: AC
  Administered 2019-02-08 – 2019-02-09 (×2): 40 meq via ORAL
  Filled 2019-02-08 (×2): qty 2

## 2019-02-08 MED ORDER — ALLOPURINOL 100 MG PO TABS
100.0000 mg | ORAL_TABLET | Freq: Every day | ORAL | Status: DC
  Administered 2019-02-09 – 2019-02-14 (×6): 100 mg via ORAL
  Filled 2019-02-08 (×6): qty 1

## 2019-02-08 MED ORDER — ACETAMINOPHEN 325 MG PO TABS
650.0000 mg | ORAL_TABLET | Freq: Four times a day (QID) | ORAL | Status: DC | PRN
  Administered 2019-02-10 – 2019-02-11 (×2): 650 mg via ORAL
  Filled 2019-02-08 (×2): qty 2

## 2019-02-08 MED ORDER — AMITRIPTYLINE HCL 25 MG PO TABS
50.0000 mg | ORAL_TABLET | Freq: Every day | ORAL | Status: DC
  Administered 2019-02-08 – 2019-02-13 (×6): 50 mg via ORAL
  Filled 2019-02-08 (×6): qty 2

## 2019-02-08 MED ORDER — CALCIUM CARBONATE ANTACID 500 MG PO CHEW
1.0000 | CHEWABLE_TABLET | Freq: Two times a day (BID) | ORAL | Status: DC | PRN
  Administered 2019-02-13: 200 mg via ORAL
  Filled 2019-02-08: qty 1

## 2019-02-08 MED ORDER — ACETAMINOPHEN 650 MG RE SUPP: 650.0000 mg | Freq: Four times a day (QID) | RECTAL | Status: DC | PRN

## 2019-02-08 MED ORDER — LEVOTHYROXINE SODIUM 100 MCG PO TABS
200.0000 ug | ORAL_TABLET | Freq: Every day | ORAL | Status: DC
  Administered 2019-02-09 – 2019-02-14 (×6): 200 ug via ORAL
  Filled 2019-02-08 (×6): qty 2

## 2019-02-08 MED ORDER — MAGNESIUM SULFATE 2 GM/50ML IV SOLN
4.0000 g | Freq: Once | INTRAVENOUS | Status: AC
  Administered 2019-02-08: 2 g via INTRAVENOUS
  Filled 2019-02-08: qty 100

## 2019-02-08 MED ORDER — METHOCARBAMOL 500 MG PO TABS
500.0000 mg | ORAL_TABLET | Freq: Three times a day (TID) | ORAL | Status: DC | PRN
  Administered 2019-02-08 – 2019-02-13 (×6): 500 mg via ORAL
  Filled 2019-02-08 (×6): qty 1

## 2019-02-08 MED ORDER — PRAVASTATIN SODIUM 20 MG PO TABS
20.0000 mg | ORAL_TABLET | Freq: Every day | ORAL | Status: DC
  Administered 2019-02-09 – 2019-02-10 (×2): 20 mg via ORAL
  Filled 2019-02-08 (×4): qty 1

## 2019-02-08 MED ORDER — DIPHENOXYLATE-ATROPINE 2.5-0.025 MG PO TABS: 1.0000 | ORAL_TABLET | Freq: Four times a day (QID) | ORAL | Status: DC | PRN

## 2019-02-08 NOTE — Telephone Encounter (Signed)
CRITICAL VALUE STICKER  CRITICAL VALUE: magnesium 0.7  RECEIVER (on-site recipient of call):tamara,RN  DATE & TIME NOTIFIED: 09/09 9:31  MESSENGER (representative from lab):lab  MD NOTIFIED: laura murray  TIME OF NOTIFICATION:09/09 [id number]  RESPONSE: pending

## 2019-02-08 NOTE — Progress Notes (Signed)
Recommended patient to the ER- did not respond to oral treatment.

## 2019-02-08 NOTE — H&P (Signed)
History and Physical    [name] [id number] DOB: [birth year] DOA: 02/08/2019  PCP: Marrian Salvage, FNP   Patient coming from: Home  Chief Complaint: Leg Cramps/ Abnormal Lab  HPI: [name] is a 73 y.o. female with medical history significant of iatrogenic hypothyroidism, history of breast cancer, GERD, gout, obesity, short gut syndrome, peptic ulcer disease, diabetes mellitus type 2, and other comorbidities who presented to the ED with a chief complaint of worsening diarrhea for last 2 days.  Patient has a history of short gut syndrome and has chronic diarrhea but states that it worsened 2 days ago and started having increased diarrhea and frequency of stooling.  She went to her PCP who evaluated her and she was found to have a low magnesium level and was given replacement.  Patient also found to have some lower extremity swelling she was given Lasix and had a chest x-ray done.  Patient also complained to her PCP about post menopausal spotting and PCP ordered a CT of the abdomen to further evaluate and recommended a GYN follow-up however patient represented to her PCP office and because her magnesium level still low she was directed the ED for further evaluation.  Patient continues to have some diarrhea and has some associated symptoms with cramping, lightheadedness and dizziness along with some mild nausea.  Has not had any emesis or any chest pain.  Because of her history of short gut syndrome she is given IV magnesium in the ED and referred to [facility] for further evaluation for admission.  ED Course: In the ED she had basic blood work done including a CMP and a CBC and she was given 4 g of IV mag sulfate.  Review of Systems: As per HPI otherwise all other systems reviewed and negative.   Past Medical History:  Diagnosis Date  . ANEMIA, IRON DEFICIENCY 05/08/2009  . Angina   . ASYMPTOMATIC POSTMENOPAUSAL STATUS 10/11/2008  . Blood transfusion   . Blood transfusion  without reported diagnosis   . Breast cancer ([place]) 09/29/11   invasive grade III ductal ca,assoc high grade dcis,ER/PR=neg  . C. difficile colitis   . Diverticulosis of colon (without mention of hemorrhage)   . Esophageal reflux 06/12/2008  . Gastroparesis   . GOITER, MULTINODULAR 04/02/2009  . Gout, unspecified   . H/O hiatal hernia   . History of lower GI bleeding   . History of radiation therapy 02/08/12-03/25/12   left breast,total 61gy  . Hypokalemia 05/11/2013  . Hypomagnesemia   . HYPOTHYROIDISM, POST-RADIATION 08/13/2009  . Internal hemorrhoids without mention of complication   . Kidney stones    "several"  . Leukopenia   . Migraines   . Obesity   . Osteoarthrosis, unspecified whether generalized or localized, unspecified site   . Other and unspecified hyperlipidemia   . Personal history of chemotherapy 2013  . Personal history of radiation therapy 2013   left  . PONV (postoperative nausea and vomiting)   . PUD (peptic ulcer disease)   . Short bowel syndrome   . Shortness of breath on exertion    "sometimes"  . Stricture and stenosis of esophagus   . Thyrotoxicosis without mention of goiter or other cause, without mention of thyrotoxic crisis or storm   . Type II or unspecified type diabetes mellitus without mention of complication, not stated as uncontrolled    no med in years diet controled  . Unspecified essential hypertension   . UTI (urinary tract infection)   .  Varicose veins   . VITAMIN B12 DEFICIENCY 08/30/2009    Past Surgical History:  Procedure Laterality Date  . ABDOMINAL ADHESION SURGERY  1980's thru 1990's   "several"  . ABDOMINAL HYSTERECTOMY  1970's   with BSO  . BREAST BIOPSY Left 08/13/11   left breast lower inner quadrant  . BREAST BIOPSY Right 1985   Rt exc bx, benign  . BREAST LUMPECTOMY Left 08/2011  . BREAST LUMPECTOMY W/ NEEDLE LOCALIZATION  09/29/11   left  breast=lymph node,excision benign/ ER/PR=neg, her 2 Positive  . BUNIONECTOMY   1970's   bilateral  . CHOLECYSTECTOMY  1990's  . COLON SURGERY     "several surgeries for short bowel syndrome"  . COLONOSCOPY  2012   multiple   . DILATION AND CURETTAGE OF UTERUS    . ESOPHAGOGASTRODUODENOSCOPY  2011   multiple   . EXCISIONAL HEMORRHOIDECTOMY  11/10/2016  . EYE SURGERY     "long time ago"  . FLEXIBLE SIGMOIDOSCOPY  2011   multiple   . KIDNEY STONE SURGERY  1990's   "tried to go up & get it but pushed it further up"  . LITHOTRIPSY     "4 or 5 times"  . MASTECTOMY W/ NODES PARTIAL  09/29/11   left  . PORT-A-CATH REMOVAL Right 12/19/2013   Procedure: MINOR REMOVAL PORT-A-CATH;  Surgeon: Adin Hector, MD;  Location: [place];  Service: General;  Laterality: Right;  . PORTACATH PLACEMENT  09/29/2011   Procedure: INSERTION PORT-A-CATH;  Surgeon: Adin Hector, MD;  Location: [place];  Service: General;  Laterality: N/A;  . Thyroid Ultrasound  12/1994 and 12/1995  . TOTAL KNEE ARTHROPLASTY Right 06/05/2015   Procedure: RIGHT TOTAL KNEE ARTHROPLASTY;  Surgeon: Ninetta Lights, MD;  Location: [place];  Service: Orthopedics;  Laterality: Right;  . VEIN LIGATION AND STRIPPING  1980's   Right leg   SOCIAL HISTORY  reports that she quit smoking about 33 years ago. Her smoking use included cigarettes. She has a 10.00 pack-year smoking history. She has never used smokeless tobacco. She reports that she does not drink alcohol or use drugs.  Allergies  Allergen Reactions  . Aspirin Other (See Comments)    REACTION: Gi Intolerance/ Burning in stomach  . Iodinated Diagnostic Agents Itching  . Trazodone And Nefazodone     "sick"  . Flagyl [Metronidazole] Rash  . Iodine Itching    Allergic to IVP dye  . Morphine And Related Itching    Family History  Problem Relation Age of Onset  . Esophageal cancer Son        deceased  . Diabetes Mother   . Heart disease Mother   . Kidney disease Sister   . Diabetes Father   . Hypertension Father   . Kidney disease  Brother        x 3  . Colon cancer Paternal Uncle   . Breast cancer Maternal Aunt   . Breast cancer Paternal Aunt   . Breast cancer Cousin        Pt states she has 15+ cousins w/ Breast CA   Prior to Admission medications   Medication Sig Start Date End Date Taking? Authorizing Provider  allopurinol (ZYLOPRIM) 100 MG tablet Take 1 tablet (100 mg total) by mouth daily. 03/18/18   Marrian Salvage, FNP  aMILoride (MIDAMOR) 5 MG tablet Take 1 tablet (5 mg total) by mouth 2 (two) times daily. 04/06/18   Marrian Salvage, FNP  amitriptyline Madelin Headings)  50 MG tablet Take 1 tablet (50 mg total) by mouth at bedtime. 12/16/18   Ladene Artist, MD  busPIRone (BUSPAR) 15 MG tablet Take 1 tablet (15 mg total) by mouth 2 (two) times daily. Patient not taking: Reported on 02/07/2019 03/18/18   Marrian Salvage, FNP  calcium carbonate (TUMS - DOSED IN MG ELEMENTAL CALCIUM) 500 MG chewable tablet Chew 1 tablet by mouth as needed for indigestion or heartburn.    [provider]  Calcium Carbonate-Vitamin D (CALCIUM-VITAMIN D) 600-200 MG-UNIT CAPS Take 1 capsule by mouth daily.      [provider]  cholestyramine (QUESTRAN) 4 g packet Take 1 packet in juice or water by mouth twice daily. 02/09/18   Ladene Artist, MD  Cyanocobalamin (VITAMIN B 12 PO) Take 1 tablet by mouth 2 (two) times daily.     [provider]  dicyclomine (BENTYL) 20 MG tablet Take 1 tablet (20 mg total) by mouth 4 (four) times daily -  before meals and at bedtime. 12/16/18   Ladene Artist, MD  diphenoxylate-atropine (LOMOTIL) 2.5-0.025 MG tablet Take 1 tablet by mouth 3 times daily as needed for diarrhea. 01/23/19   Ladene Artist, MD  famotidine (PEPCID) 40 MG tablet Take 1 tablet (40 mg total) by mouth at bedtime. 05/20/18   Ladene Artist, MD  fluticasone (FLONASE) 50 MCG/ACT nasal spray Place 2 sprays into both nostrils daily. 12/27/18   Marrian Salvage, FNP  furosemide (LASIX) 20 MG  tablet Take 1 tablet (20 mg total) by mouth daily as needed for edema. 02/07/19   Marrian Salvage, FNP  gabapentin (NEURONTIN) 300 MG capsule Take 2 capsules (600 mg total) by mouth 2 (two) times daily. Patient not taking: Reported on 02/07/2019 03/18/18   Marrian Salvage, FNP  HYDROcodone-acetaminophen (NORCO) 7.5-325 MG tablet Take 1 tablet by mouth every 6 (six) hours as needed for moderate pain. 12/16/18   Ladene Artist, MD  levothyroxine (SYNTHROID, LEVOTHROID) 200 MCG tablet Take 1 tablet (200 mcg total) by mouth daily before breakfast. 06/13/18   Marrian Salvage, FNP  Lidocaine, Anorectal, 5 % GEL Apply a pea-sized amount to the affected area 3 times daily. Patient not taking: Reported on 02/07/2019 09/12/16   Ocie Cornfield T, PA-C  loratadine (CLARITIN) 10 MG tablet Take 1 tablet (10 mg total) by mouth daily. 12/27/18   Marrian Salvage, FNP  lovastatin (MEVACOR) 20 MG tablet TAKE 1 TABLET BY MOUTH  DAILY AT 6 PM. 03/17/18   Renato Shin, MD  Magnesium Oxide 400 MG CAPS Take bid as directed 02/07/19   Marrian Salvage, FNP  methocarbamol (ROBAXIN) 500 MG tablet Take 1 tablet (500 mg total) by mouth every 8 (eight) hours as needed for muscle spasms. 01/23/19   Marrian Salvage, FNP  metoprolol tartrate (LOPRESSOR) 50 MG tablet Take 1 tablet (50mg ) 2 hours prior to your Cardiac CT Patient not taking: Reported on 02/07/2019 06/23/18   Elouise Munroe, MD  nitroGLYCERIN (NITROSTAT) 0.4 MG SL tablet Place 1 tablet (0.4 mg total) under the tongue every 5 (five) minutes as needed. 12/27/18   Elouise Munroe, MD  ondansetron (ZOFRAN-ODT) 4 MG disintegrating tablet Take 1 tablet (4 mg total) by mouth every 8 (eight) hours as needed. for nausea 04/06/18   Marrian Salvage, FNP  pantoprazole (PROTONIX) 40 MG tablet TAKE 1 TABLET BY MOUTH 30 MINUTES PRIOR TO BREAKFAST AND SUPPER 03/17/18   Renato Shin, MD  predniSONE Donley Redder)  50 MG tablet Take 1 tab (50mg ) 13  hours prior to test,Take 1 tab (50mg ) 7 hours prior to test, Take 1 tab (50mg ) 1 hours prior to test Patient not taking: Reported on 02/07/2019 06/23/18   Elouise Munroe, MD  rifaximin (XIFAXAN) 550 MG TABS tablet Take 1 tablet by mouth 3 times daily for 14 days. 12/16/18   Ladene Artist, MD  Thiamine HCl (THIAMINE PO) Take by mouth.    [provider]   Physical Exam: Vitals:   02/08/19 1722 02/08/19 1730 02/08/19 1800 02/08/19 1830  BP: (!) 154/101 (!) 155/104 (!) 137/105 (!) 143/103  Pulse: 60 65 66 64  Resp: 11 15 13 16   Temp:      TempSrc:      SpO2: 100% 100% 100% 100%   Constitutional: WN/WD obese African-American female currently inNAD and appears calm  Eyes: Lids and conjunctivae normal, sclerae anicteric  ENMT: External Ears, Nose appear normal. Grossly normal hearing. Mucous membranes are slightly dry Neck: Appears normal, supple Respiratory: Diminished to auscultation bilaterally, no wheezing, rales, rhonchi or crackles. Normal respiratory effort and patient is not tachypenic. No accessory muscle use.  Cardiovascular: RRR, no murmurs / rubs / gallops. S1 and S2 auscultated. Some pedal extremity edema.   Abdomen: Soft, mildly tender, Distended due to body habitus. No masses palpated. No appreciable hepatosplenomegaly. Bowel sounds positive x4.  GU: Deferred. Musculoskeletal: No clubbing / cyanosis of digits/nails. No joint deformity upper and lower extremities.  Skin: No rashes, lesions, ulcers on a limited skin evaluation. No induration; Warm and dry.  Neurologic: CN 2-12 grossly intact with no focal deficits.  Romberg sign and cerebellar reflexes not assessed.  Psychiatric: Normal judgment and insight. Alert and oriented x 3. Normal mood and appropriate affect.   Labs on Admission: I have personally reviewed following labs and imaging studies  CBC: Recent Labs  Lab 02/07/19 1102 02/08/19 1715  WBC 4.2 4.5  NEUTROABS 2.2 2.5  HGB 10.4* 10.9*  HCT 31.6*  33.8*  MCV 92.4 95.2  PLT 239.0 0000000   Basic Metabolic Panel: Recent Labs  Lab 02/07/19 1102 02/08/19 0802 02/08/19 1715  NA 143  --  139  K 3.5  --  3.2*  CL 105  --  103  CO2 27  --  26  GLUCOSE 86  --  87  BUN 17  --  16  CREATININE 1.04  --  1.10*  CALCIUM 8.3*  --  8.2*  MG 0.8* 0.7*  --    GFR: Estimated Creatinine Clearance: 51.8 mL/min (A) (by C-G formula based on SCr of 1.1 mg/dL (H)). Liver Function Tests: Recent Labs  Lab 02/07/19 1102 02/08/19 1715  AST 33 51*  ALT 33 41  ALKPHOS 55 62  BILITOT 0.5 0.7  PROT 7.0 8.0  ALBUMIN 3.9 4.2   No results for input(s): LIPASE, AMYLASE in the last 168 hours. No results for input(s): AMMONIA in the last 168 hours. Coagulation Profile: No results for input(s): INR, PROTIME in the last 168 hours. Cardiac Enzymes: No results for input(s): CKTOTAL, CKMB, CKMBINDEX, TROPONINI in the last 168 hours. BNP (last 3 results) Recent Labs    02/16/18 1408 02/07/19 1102  PROBNP 60.0 115.0*   HbA1C: No results for input(s): HGBA1C in the last 72 hours. CBG: No results for input(s): GLUCAP in the last 168 hours. Lipid Profile: No results for input(s): CHOL, HDL, LDLCALC, TRIG, CHOLHDL, LDLDIRECT in the last 72 hours. Thyroid Function Tests: No results for  input(s): TSH, T4TOTAL, FREET4, T3FREE, THYROIDAB in the last 72 hours. Anemia Panel: Recent Labs    02/07/19 1102  VITAMINB12 >1500*   Urine analysis:    Component Value Date/Time   COLORURINE AMBER (A) 12/19/2016 1613   APPEARANCEUR CLOUDY (A) 12/19/2016 1613   LABSPEC 1.027 12/19/2016 1613   PHURINE 5.0 12/19/2016 1613   GLUCOSEU NEGATIVE 12/19/2016 1613   GLUCOSEU NEGATIVE 05/14/2015 1415   HGBUR SMALL (A) 12/19/2016 1613   HGBUR large 01/03/2008 1357   BILIRUBINUR SMALL (A) 12/19/2016 1613   BILIRUBINUR 1 11/13/2014 1331   KETONESUR 5 (A) 12/19/2016 1613   PROTEINUR 30 (A) 12/19/2016 1613   UROBILINOGEN 0.2 05/14/2015 1415   NITRITE NEGATIVE  12/19/2016 1613   LEUKOCYTESUR MODERATE (A) 12/19/2016 1613   Sepsis Labs: !!!!!!!!!!!!!!!!!!!!!!!!!!!!!!!!!!!!!!!!!!!! @LABRCNTIP (procalcitonin:4,lacticidven:4) )No results found for this or any previous visit (from the past 240 hour(s)).   Radiological Exams on Admission: Dg Chest 2 View  Result Date: 02/07/2019 CLINICAL DATA:  Pedal edema EXAM: CHEST - 2 VIEW COMPARISON:  07/30/2017 FINDINGS: Cardiac shadows within normal limits. Aortic calcifications are noted. The lungs are clear bilaterally. No acute bony abnormality is seen. IMPRESSION: No acute abnormality noted. Electronically Signed   By: Inez Catalina M.D.   On: 02/07/2019 15:16    EKG: Independently reviewed.  Shows sinus rhythm with LVH and low voltage with a rate of 62 bpm and a QTC of 476 on my interpretation  Assessment/Plan Active Problems:   Hypothyroidism following radioiodine therapy   Vitamin B 12 deficiency   Dyslipidemia   Gout   Esophageal reflux   Diarrhea   Abdominal pain   Edema   Primary cancer of lower-inner quadrant of left female breast ([place])   Hypomagnesemia   Hypokalemia   Generalized weakness   Iron deficiency anemia   Short gut syndrome    Symptomatic Hypomagnesemia in the setting of short gut syndrome and diarrhea -Patient presents with a Mag Level of 0.7 on Admission with Muscle Cramps and a Headache -Given 4 grams of IV Mag Sulfate in the ED -Repeat is Pending -Ca2+ was 8.3 yesterday with repeat this AM pending -Repeat Mag Level and replace as necessary as she has a Hx of Short Gut/Bowel Syndrome and was having some Diarrhea -Has a Hx of Hypomagnesemia and Hx of C Difficle Colitis -Currently taking Mag Chloride 64 mg po Daily but will hold currently -Hold Further Lasix Dosing  Hypokalemia -Patient's K+ this Afternoon was 3.2 -Replete with IV KCl 30 mEQ and po KCl 40 mEQ BID -Replete Mag as above -Continue to Monitor and Replete as Necessary -Patient take Amiloride as a Potassium  Sparing Diuretic  -In the setting of Short Gut Syndrome -Repeat CMP in AM   Short gut syndrome and 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 and history of chronic right lower quadrant pain but now with midepigastric pain -Noticed an increase in chronic diarrhea with urgency and occasional incontinence and she was prescribed rifaximin in June but did not receive it at that time and she is completed the course -Was on cholestyramine but has not been effective so she discontinued -Uses Lomotil frequently and will continue this for now -Per GI's review of last clinic note recommending short-term narcotics and amitriptyline as well -We will obtain a CT of the abdomen pelvis without contrast given her renal insufficiency and check a GI pathogen panel.  Low suspicion for C. difficile given afebrile and no leukocytosis -We will need to discuss with gastroenterology  and have them consult in the a.m. as she may need to go back on Xifaxan  Generalized Weakness -in the setting of Hypomag and HypoKalemia  -PT/OT to Evaluate and Treat -Replete Electrolytes -Check orthostatics on admission  GERD and Hx of Gastroparesis -Continue with pantoprazole 40 mg p.o. twice daily along with famotidine  Iatrogenic Hypothyrodisim -Check TSH -Continue with levothyroxine 200 mcg p.o. daily before breakfast  Renal Insuffiencey  -Mildly worsened and Renal Fxn is now 16/1.10 -Avoid Nephrotoxic Medications, Contrast Dyes, and Hypotension -Continue to hold glimepiride 5 mg p.o. twice daily along with furosemide 20 mg p.o. daily PRN -Started Gentle IVF Hydration with NS at a rate of 75 mL/hr -Check UA and UDS -Repeat CMP in AM   Normocytic Anemia -Patient's Hb/Hct went from 10.4/31.6 -> 10.9/33.8 -Check Anemia Panel in the AM -Continue to Monitor for S/Sx of Bleeding; Has had Post-Menopausal Bleeding -Repeat CBC in AM   Abnormal AST -AST was elevated 51  -Check RUQ U/S and Acute  Heptatitis Panel if worsening or not improving -Continue to Monitor and Trend Hepatic Fxn -Repeat CMP in AM   Diabetes Mellitus Type 2 -Appears to be diet controlled and blood sugar on admission was stable and 87 Plan continue to monitor and if necessary will need to place on sensitive NovoLog/scale insulin AC  HLD -Continue with home lovastatin  B12 Deficiency  -Check B12 level and continue B12 supplementation as deemed necessary  PostMenopausal Bleeding  -PCP referred for Outpateint CT Abd/Pelvis with Contrast however we will do it without contrast here given her renal insufficiency -Outpatient Workup by GYN   History of left breast cancer with chemotherapy and radiation -Outpatient follow-up  Recent chest pain -Was given a refill of nitroglycerin by her cardiologist Dr. Margaretann Loveless  -currently denies any chest pain now  Obesity -Estimated body mass index is 35.71 kg/m as calculated from the following:   Height as of 02/07/19: 5\' 5"  (1.651 m).   Weight as of 02/07/19: 97.3 kg. -Weight Loss and Dietary Counseling given   DVT prophylaxis:  Code Status: FULL CODE  Family Communication: No Family present at bedside  Disposition Plan: Anticipate D/C Home in the Next 24-48 hours Consults called: None we will need to discuss with GI in the a.m. Admission status: Obs Telemetry   Severity of Illness: The appropriate patient status for this patient is OBSERVATION. Observation status is judged to be reasonable and necessary in order to provide the required intensity of service to ensure the patient's safety. The patient's presenting symptoms, physical exam findings, and initial radiographic and laboratory data in the context of their medical condition is felt to place them at decreased risk for further clinical deterioration. Furthermore, it is anticipated that the patient will be medically stable for discharge from the hospital within 2 midnights of admission. The following factors support  the patient status of observation.   " The patient's presenting symptoms include diarrhea, cramping, dizziness, mild nausea. " The physical exam findings include slightly dry mucous membranes. " The initial radiographic and laboratory data are concerning for hypomagnesemia, hypokalemia and mild renal insufficiency.  Kerney Elbe, D.O. [facility] PAGER is on [place]  If 7PM-7AM, please contact night-coverage [email or web address] Password [facility]  02/08/2019, 6:42 PM

## 2019-02-08 NOTE — ED Provider Notes (Signed)
[facility] DEPT Provider Note   CSN: [id number] Arrival date & time: 02/08/19  1227     History   Chief Complaint Chief Complaint  Patient presents with  . Abnormal Lab  . Spasms  . Headache    HPI [name] is a 73 y.o. female.     HPI   21yf with hypomagnesemia. Hx of the same but stopped supplementation because of diarrhea. Also hx of short gut. Began having muscle cramping a couple weeks ago. Saw PCP who did labs. Potassium very low. Advised to restart oral mag. Repeat labs still low and then advised to come to the ED. Occasional mild HA.   Past Medical History:  Diagnosis Date  . ANEMIA, IRON DEFICIENCY 05/08/2009  . Angina   . ASYMPTOMATIC POSTMENOPAUSAL STATUS 10/11/2008  . Blood transfusion   . Blood transfusion without reported diagnosis   . Breast cancer ([place]) 09/29/11   invasive grade III ductal ca,assoc high grade dcis,ER/PR=neg  . C. difficile colitis   . Diverticulosis of colon (without mention of hemorrhage)   . Esophageal reflux 06/12/2008  . Gastroparesis   . GOITER, MULTINODULAR 04/02/2009  . Gout, unspecified   . H/O hiatal hernia   . History of lower GI bleeding   . History of radiation therapy 02/08/12-03/25/12   left breast,total 59gy  . Hypokalemia 05/11/2013  . Hypomagnesemia   . HYPOTHYROIDISM, POST-RADIATION 08/13/2009  . Internal hemorrhoids without mention of complication   . Kidney stones    "several"  . Leukopenia   . Migraines   . Obesity   . Osteoarthrosis, unspecified whether generalized or localized, unspecified site   . Other and unspecified hyperlipidemia   . Personal history of chemotherapy 2013  . Personal history of radiation therapy 2013   left  . PONV (postoperative nausea and vomiting)   . PUD (peptic ulcer disease)   . Short bowel syndrome   . Shortness of breath on exertion    "sometimes"  . Stricture and stenosis of esophagus   . Thyrotoxicosis without mention of goiter or  other cause, without mention of thyrotoxic crisis or storm   . Type II or unspecified type diabetes mellitus without mention of complication, not stated as uncontrolled    no med in years diet controled  . Unspecified essential hypertension   . UTI (urinary tract infection)   . Varicose veins   . VITAMIN B12 DEFICIENCY 08/30/2009    Patient Active Problem List   Diagnosis Date Noted  . Dyspnea 02/16/2018  . 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 UTI (urinary tract infection) 11/27/2014  . UTI (lower urinary tract infection) 11/27/2014  . Acute cystitis with hematuria 11/13/2014  . Hemorrhoids, external 11/04/2014  . Wellness examination 10/03/2014  . Pain in joint, shoulder region 09/24/2014  . Cramp in limb 06/26/2014  . Rectal bleeding 05/02/2014  . External hemorrhoids 05/02/2014  . Hemorrhoids without complication [id number]  . Pain in joint, lower leg 02/01/2014  . Vitamin D deficiency 01/16/2014  . Gastroparesis 09/14/2013  . Other dysphagia 09/04/2013  . Nausea alone 09/04/2013  . Iron deficiency anemia 08/28/2013  . Hypomagnesemia 05/11/2013  . Hypokalemia 05/11/2013  . Generalized weakness 05/11/2013  . Dehydration 11/25/2011  . Fatigue 11/16/2011  . Breast cancer ([place]) 09/29/2011  . Family history of breast cancer 08/19/2011  . Primary cancer of lower-inner quadrant of left female breast ([place]) 08/17/2011  . Neck pain 07/29/2011  . Routine general medical examination at a health  care facility 06/28/2011  . Edema 11/26/2010  . CLOSTRIDIUM DIFFICILE COLITIS 02/07/2010  . Blind loop syndrome 10/01/2009  . Abdominal pain 10/01/2009  . Vitamin B 12 deficiency 08/30/2009  . 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 Rash and nonspecific skin eruption 08/13/2009  . GOITER, MULTINODULAR 04/02/2009  . CONTACT DERMATITIS&OTHER ECZEMA DUE UNSPEC CAUSE 02/20/2009  . URINARY CALCULUS 10/11/2008  . Asymptomatic menopausal state 10/11/2008  .  BACK PAIN, CHRONIC 07/03/2008  . Esophageal reflux 06/12/2008  . Diarrhea 06/12/2008  . ABDOMINAL PAIN-RUQ 05/17/2008  . Diabetes ([place]) 12/10/2006  . Dyslipidemia 12/10/2006  . Gout 12/10/2006  . HYPERTENSION 12/10/2006  . DIVERTICULOSIS, COLON 12/10/2006  . OSTEOARTHRITIS 12/10/2006  . ESOPHAGEAL STRICTURE 08/23/2002  . HIATAL HERNIA 08/23/2002  . INTERNAL HEMORRHOIDS 02/02/2001    Past Surgical History:  Procedure Laterality Date  . ABDOMINAL ADHESION SURGERY  1980's thru 1990's   "several"  . ABDOMINAL HYSTERECTOMY  1970's   with BSO  . BREAST BIOPSY Left 08/13/11   left breast lower inner quadrant  . BREAST BIOPSY Right 1985   Rt exc bx, benign  . BREAST LUMPECTOMY Left 08/2011  . BREAST LUMPECTOMY W/ NEEDLE LOCALIZATION  09/29/11   left  breast=lymph node,excision benign/ ER/PR=neg, her 2 Positive  . BUNIONECTOMY  1970's   bilateral  . CHOLECYSTECTOMY  1990's  . COLON SURGERY     "several surgeries for short bowel syndrome"  . COLONOSCOPY  2012   multiple   . DILATION AND CURETTAGE OF UTERUS    . ESOPHAGOGASTRODUODENOSCOPY  2011   multiple   . EXCISIONAL HEMORRHOIDECTOMY  11/10/2016  . EYE SURGERY     "long time ago"  . FLEXIBLE SIGMOIDOSCOPY  2011   multiple   . KIDNEY STONE SURGERY  1990's   "tried to go up & get it but pushed it further up"  . LITHOTRIPSY     "4 or 5 times"  . MASTECTOMY W/ NODES PARTIAL  09/29/11   left  . PORT-A-CATH REMOVAL Right 12/19/2013   Procedure: MINOR REMOVAL PORT-A-CATH;  Surgeon: Adin Hector, MD;  Location: [facility];  Service: General;  Laterality: Right;  . PORTACATH PLACEMENT  09/29/2011   Procedure: INSERTION PORT-A-CATH;  Surgeon: Adin Hector, MD;  Location: [facility];  Service: General;  Laterality: N/A;  . Thyroid Ultrasound  12/1994 and 12/1995  . TOTAL KNEE ARTHROPLASTY Right 06/05/2015   Procedure: RIGHT TOTAL KNEE ARTHROPLASTY;  Surgeon: Ninetta Lights, MD;  Location: [facility];  Service: Orthopedics;   Laterality: Right;  . VEIN LIGATION AND STRIPPING  1980's   Right leg     OB History   No obstetric history on file.      Home Medications    Prior to Admission medications   Medication Sig Start Date End Date Taking? Authorizing Provider  allopurinol (ZYLOPRIM) 100 MG tablet Take 1 tablet (100 mg total) by mouth daily. 03/18/18   Marrian Salvage, FNP  aMILoride (MIDAMOR) 5 MG tablet Take 1 tablet (5 mg total) by mouth 2 (two) times daily. 04/06/18   Marrian Salvage, FNP  amitriptyline (ELAVIL) 50 MG tablet Take 1 tablet (50 mg total) by mouth at bedtime. 12/16/18   Ladene Artist, MD  busPIRone (BUSPAR) 15 MG tablet Take 1 tablet (15 mg total) by mouth 2 (two) times daily. Patient not taking: Reported on 02/07/2019 03/18/18   Marrian Salvage, FNP  calcium carbonate (TUMS - DOSED IN MG ELEMENTAL CALCIUM) 500 MG chewable  tablet Chew 1 tablet by mouth as needed for indigestion or heartburn.    [provider]  Calcium Carbonate-Vitamin D (CALCIUM-VITAMIN D) 600-200 MG-UNIT CAPS Take 1 capsule by mouth daily.      [provider]  cholestyramine (QUESTRAN) 4 g packet Take 1 packet in juice or water by mouth twice daily. 02/09/18   Ladene Artist, MD  Cyanocobalamin (VITAMIN B 12 PO) Take 1 tablet by mouth 2 (two) times daily.     [provider]  dicyclomine (BENTYL) 20 MG tablet Take 1 tablet (20 mg total) by mouth 4 (four) times daily -  before meals and at bedtime. 12/16/18   Ladene Artist, MD  diphenoxylate-atropine (LOMOTIL) 2.5-0.025 MG tablet Take 1 tablet by mouth 3 times daily as needed for diarrhea. 01/23/19   Ladene Artist, MD  famotidine (PEPCID) 40 MG tablet Take 1 tablet (40 mg total) by mouth at bedtime. 05/20/18   Ladene Artist, MD  fluticasone (FLONASE) 50 MCG/ACT nasal spray Place 2 sprays into both nostrils daily. 12/27/18   Marrian Salvage, FNP  furosemide (LASIX) 20 MG tablet Take 1 tablet (20 mg total) by mouth  daily as needed for edema. 02/07/19   Marrian Salvage, FNP  gabapentin (NEURONTIN) 300 MG capsule Take 2 capsules (600 mg total) by mouth 2 (two) times daily. Patient not taking: Reported on 02/07/2019 03/18/18   Marrian Salvage, FNP  HYDROcodone-acetaminophen (NORCO) 7.5-325 MG tablet Take 1 tablet by mouth every 6 (six) hours as needed for moderate pain. 12/16/18   Ladene Artist, MD  levothyroxine (SYNTHROID, LEVOTHROID) 200 MCG tablet Take 1 tablet (200 mcg total) by mouth daily before breakfast. 06/13/18   Marrian Salvage, FNP  Lidocaine, Anorectal, 5 % GEL Apply a pea-sized amount to the affected area 3 times daily. Patient not taking: Reported on 02/07/2019 09/12/16   Ocie Cornfield T, PA-C  loratadine (CLARITIN) 10 MG tablet Take 1 tablet (10 mg total) by mouth daily. 12/27/18   Marrian Salvage, FNP  lovastatin (MEVACOR) 20 MG tablet TAKE 1 TABLET BY MOUTH  DAILY AT 6 PM. 03/17/18   Renato Shin, MD  Magnesium Oxide 400 MG CAPS Take bid as directed 02/07/19   Marrian Salvage, FNP  methocarbamol (ROBAXIN) 500 MG tablet Take 1 tablet (500 mg total) by mouth every 8 (eight) hours as needed for muscle spasms. 01/23/19   Marrian Salvage, FNP  metoprolol tartrate (LOPRESSOR) 50 MG tablet Take 1 tablet (50mg ) 2 hours prior to your Cardiac CT Patient not taking: Reported on 02/07/2019 06/23/18   Elouise Munroe, MD  nitroGLYCERIN (NITROSTAT) 0.4 MG SL tablet Place 1 tablet (0.4 mg total) under the tongue every 5 (five) minutes as needed. 12/27/18   Elouise Munroe, MD  ondansetron (ZOFRAN-ODT) 4 MG disintegrating tablet Take 1 tablet (4 mg total) by mouth every 8 (eight) hours as needed. for nausea 04/06/18   Marrian Salvage, FNP  pantoprazole (PROTONIX) 40 MG tablet TAKE 1 TABLET BY MOUTH 30 MINUTES PRIOR TO BREAKFAST AND SUPPER 03/17/18   Renato Shin, MD  predniSONE (DELTASONE) 50 MG tablet Take 1 tab (50mg ) 13 hours prior to test,Take 1 tab (50mg ) 7  hours prior to test, Take 1 tab (50mg ) 1 hours prior to test Patient not taking: Reported on 02/07/2019 06/23/18   Elouise Munroe, MD  rifaximin (XIFAXAN) 550 MG TABS tablet Take 1 tablet by mouth 3 times daily for 14 days. 12/16/18  Ladene Artist, MD  Thiamine HCl (THIAMINE PO) Take by mouth.    [provider]    Family History Family History  Problem Relation Age of Onset  . Esophageal cancer Son        deceased  . Diabetes Mother   . Heart disease Mother   . Kidney disease Sister   . Diabetes Father   . Hypertension Father   . Kidney disease Brother        x 3  . Colon cancer Paternal Uncle   . Breast cancer Maternal Aunt   . Breast cancer Paternal Aunt   . Breast cancer Cousin        Pt states she has 15+ cousins w/ Breast CA    Social History Social History   Tobacco Use  . Smoking status: Former Smoker    Packs/day: 1.00    Years: 10.00    Pack years: 10.00    Types: Cigarettes    Quit date: 09/22/1985    Years since quitting: 33.4  . Smokeless tobacco: Never Used  Substance Use Topics  . Alcohol use: No    Comment: 09/29/11 "used to drink socially years ago"  . Drug use: No     Allergies   Aspirin, Iodinated diagnostic agents, Trazodone and nefazodone, Flagyl [metronidazole], Iodine, and Morphine and related   Review of Systems Review of Systems  All systems reviewed and negative, other than as noted in HPI.  Physical Exam Updated Vital Signs BP (!) 146/84   Pulse 61   Temp 98.4 F (36.9 C) (Oral)   Resp 13   SpO2 100%   Physical Exam Vitals signs and nursing note reviewed.  Constitutional:      General: She is not in acute distress.    Appearance: She is well-developed.  HENT:     Head: Normocephalic and atraumatic.  Eyes:     General:        Right eye: No discharge.        Left eye: No discharge.     Conjunctiva/sclera: Conjunctivae normal.  Neck:     Musculoskeletal: Neck supple.  Cardiovascular:     Rate and Rhythm:  Normal rate and regular rhythm.     Heart sounds: Normal heart sounds. No murmur. No friction rub. No gallop.   Pulmonary:     Effort: Pulmonary effort is normal. No respiratory distress.     Breath sounds: Normal breath sounds.  Abdominal:     General: There is no distension.     Palpations: Abdomen is soft.     Tenderness: There is no abdominal tenderness.  Musculoskeletal:        General: No tenderness.  Skin:    General: Skin is warm and dry.  Neurological:     Mental Status: She is alert.     Comments: Speech clear. Content appropriate. Following commands. Cn 2-12 intact. Strength normal in all extremities. Normal muscle tone. Patellar reflexes 2+ b/l  Psychiatric:        Behavior: Behavior normal.        Thought Content: Thought content normal.      ED Treatments / Results  Labs (all labs ordered are listed, but only abnormal results are displayed) Labs Reviewed  COMPREHENSIVE METABOLIC PANEL - Abnormal; Notable for the following components:      Result Value   Potassium 3.2 (*)    Creatinine, Ser 1.10 (*)    Calcium 8.2 (*)    AST 51 (*)  GFR calc non Af Amer 49 (*)    GFR calc Af Amer 57 (*)    All other components within normal limits  CBC WITH DIFFERENTIAL/PLATELET - Abnormal; Notable for the following components:   RBC 3.55 (*)    Hemoglobin 10.9 (*)    HCT 33.8 (*)    All other components within normal limits  SARS CORONAVIRUS 2 (TAT 6-24 HRS)  RAPID URINE DRUG SCREEN, HOSP PERFORMED  URINALYSIS, ROUTINE W REFLEX MICROSCOPIC    EKG EKG Interpretation  Date/Time:  Wednesday February 08 2019 16:47:04 EDT Ventricular Rate:  62 PR Interval:    QRS Duration: 105 QT Interval:  468 QTC Calculation: 476 R Axis:     Text Interpretation:  Sinus rhythm Low voltage, precordial leads Abnormal R-wave progression, early transition Left ventricular hypertrophy Confirmed by Virgel Manifold [phone]) on 02/08/2019 5:29:17 PM   Radiology Dg Chest 2 View  Result  Date: 02/07/2019 CLINICAL DATA:  Pedal edema EXAM: CHEST - 2 VIEW COMPARISON:  07/30/2017 FINDINGS: Cardiac shadows within normal limits. Aortic calcifications are noted. The lungs are clear bilaterally. No acute bony abnormality is seen. IMPRESSION: No acute abnormality noted. Electronically Signed   By: Inez Catalina M.D.   On: 02/07/2019 15:16    Procedures Procedures (including critical care time)  Medications Ordered in ED Medications  magnesium sulfate IVPB 4 g 100 mL (2 g Intravenous New Bag/Given 02/08/19 1723)     Initial Impression / Assessment and Plan / ED Course  I have reviewed the triage vital signs and the nursing notes.  Pertinent labs & imaging results that were available during my care of the patient were reviewed by me and considered in my medical decision making (see chart for details).     37yF with symptomatic hypomagnesemia. History of the same and previously on supplementation but stopped because of diarrhea. Suspect etiology from short gut syndrome. Also on PPI. Lasix just prescribed. Doesn't sounds like she has been on it chronically. Symptoms primarily muscle cramping. Mild intermittent HA. No tremor or involuntary movements. Neuro exam reassuring. EKG w/o concerning conduction abnormalities. Potassium 3.2. Ca fine. Magnesium 0.7. Will need supplementation beyond what can realistically be done in the ED setting. Will admit for ongoing management.     Final Clinical Impressions(s) / ED Diagnoses   Final diagnoses:  Hypomagnesemia  Muscle cramps    ED Discharge Orders    None       Virgel Manifold, MD 02/12/19 1341

## 2019-02-08 NOTE — ED Notes (Signed)
This nurse attempted IV access twice unsuccessfully. Other staff was requested for insertion of IV via [place].

## 2019-02-08 NOTE — ED Triage Notes (Addendum)
Pt presents after abnormal lab work (Mag 0.7).  C/o muscle cramps x 1 month and headache x 7 days.  Pain score 5/10.      Lab work can been seen in [facility].

## 2019-02-08 NOTE — Telephone Encounter (Signed)
Please let her know that her magnesium is actually lower today- she is going to have to go to the ER for IV treatment.

## 2019-02-08 NOTE — Telephone Encounter (Signed)
Advised patient of [name]'s note/instructions, it should take about a couple of hours for IV infusion, patient should take someone with her just in case she is not feeling well enough to drive---either [facility] or [facility] is ok---patient will go today

## 2019-02-09 ENCOUNTER — Observation Stay (HOSPITAL_COMMUNITY): Payer: Medicare Other

## 2019-02-09 ENCOUNTER — Other Ambulatory Visit: Payer: Self-pay

## 2019-02-09 DIAGNOSIS — Z8249 Family history of ischemic heart disease and other diseases of the circulatory system: Secondary | ICD-10-CM | POA: Diagnosis not present

## 2019-02-09 DIAGNOSIS — Z7951 Long term (current) use of inhaled steroids: Secondary | ICD-10-CM | POA: Diagnosis not present

## 2019-02-09 DIAGNOSIS — Z8711 Personal history of peptic ulcer disease: Secondary | ICD-10-CM | POA: Diagnosis not present

## 2019-02-09 DIAGNOSIS — M1990 Unspecified osteoarthritis, unspecified site: Secondary | ICD-10-CM | POA: Diagnosis present

## 2019-02-09 DIAGNOSIS — E1143 Type 2 diabetes mellitus with diabetic autonomic (poly)neuropathy: Secondary | ICD-10-CM | POA: Diagnosis present

## 2019-02-09 DIAGNOSIS — R110 Nausea: Secondary | ICD-10-CM | POA: Diagnosis not present

## 2019-02-09 DIAGNOSIS — R197 Diarrhea, unspecified: Secondary | ICD-10-CM | POA: Diagnosis not present

## 2019-02-09 DIAGNOSIS — K5900 Constipation, unspecified: Secondary | ICD-10-CM | POA: Diagnosis not present

## 2019-02-09 DIAGNOSIS — E785 Hyperlipidemia, unspecified: Secondary | ICD-10-CM | POA: Diagnosis present

## 2019-02-09 DIAGNOSIS — Z853 Personal history of malignant neoplasm of breast: Secondary | ICD-10-CM | POA: Diagnosis not present

## 2019-02-09 DIAGNOSIS — K5730 Diverticulosis of large intestine without perforation or abscess without bleeding: Secondary | ICD-10-CM | POA: Diagnosis present

## 2019-02-09 DIAGNOSIS — E876 Hypokalemia: Secondary | ICD-10-CM | POA: Diagnosis not present

## 2019-02-09 DIAGNOSIS — K3184 Gastroparesis: Secondary | ICD-10-CM | POA: Diagnosis present

## 2019-02-09 DIAGNOSIS — Z833 Family history of diabetes mellitus: Secondary | ICD-10-CM | POA: Diagnosis not present

## 2019-02-09 DIAGNOSIS — Z79890 Hormone replacement therapy (postmenopausal): Secondary | ICD-10-CM | POA: Diagnosis not present

## 2019-02-09 DIAGNOSIS — R109 Unspecified abdominal pain: Secondary | ICD-10-CM | POA: Diagnosis not present

## 2019-02-09 DIAGNOSIS — R112 Nausea with vomiting, unspecified: Secondary | ICD-10-CM | POA: Diagnosis not present

## 2019-02-09 DIAGNOSIS — Z803 Family history of malignant neoplasm of breast: Secondary | ICD-10-CM | POA: Diagnosis not present

## 2019-02-09 DIAGNOSIS — Z923 Personal history of irradiation: Secondary | ICD-10-CM | POA: Diagnosis not present

## 2019-02-09 DIAGNOSIS — Z800 Family history of malignant neoplasm of digestive organs: Secondary | ICD-10-CM | POA: Diagnosis not present

## 2019-02-09 DIAGNOSIS — K219 Gastro-esophageal reflux disease without esophagitis: Secondary | ICD-10-CM | POA: Diagnosis present

## 2019-02-09 DIAGNOSIS — M109 Gout, unspecified: Secondary | ICD-10-CM | POA: Diagnosis present

## 2019-02-09 DIAGNOSIS — R51 Headache: Secondary | ICD-10-CM | POA: Diagnosis not present

## 2019-02-09 DIAGNOSIS — I10 Essential (primary) hypertension: Secondary | ICD-10-CM | POA: Diagnosis present

## 2019-02-09 DIAGNOSIS — Z841 Family history of disorders of kidney and ureter: Secondary | ICD-10-CM | POA: Diagnosis not present

## 2019-02-09 DIAGNOSIS — Z96651 Presence of right artificial knee joint: Secondary | ICD-10-CM | POA: Diagnosis present

## 2019-02-09 DIAGNOSIS — Z20828 Contact with and (suspected) exposure to other viral communicable diseases: Secondary | ICD-10-CM | POA: Diagnosis present

## 2019-02-09 DIAGNOSIS — K5904 Chronic idiopathic constipation: Secondary | ICD-10-CM | POA: Diagnosis not present

## 2019-02-09 DIAGNOSIS — Z9221 Personal history of antineoplastic chemotherapy: Secondary | ICD-10-CM | POA: Diagnosis not present

## 2019-02-09 DIAGNOSIS — E890 Postprocedural hypothyroidism: Secondary | ICD-10-CM | POA: Diagnosis present

## 2019-02-09 DIAGNOSIS — K912 Postsurgical malabsorption, not elsewhere classified: Secondary | ICD-10-CM | POA: Diagnosis present

## 2019-02-09 LAB — COMPREHENSIVE METABOLIC PANEL
ALT: 36 U/L (ref 0–44)
AST: 40 U/L (ref 15–41)
Albumin: 3.4 g/dL — ABNORMAL LOW (ref 3.5–5.0)
Alkaline Phosphatase: 53 U/L (ref 38–126)
Anion gap: 10 (ref 5–15)
BUN: 14 mg/dL (ref 8–23)
CO2: 23 mmol/L (ref 22–32)
Calcium: 7.4 mg/dL — ABNORMAL LOW (ref 8.9–10.3)
Chloride: 107 mmol/L (ref 98–111)
Creatinine, Ser: 0.86 mg/dL (ref 0.44–1.00)
GFR calc Af Amer: >60 mL/min (ref >60)
GFR calc non Af Amer: >60 mL/min (ref >60)
Glucose, Bld: 91 mg/dL (ref 70–99)
Potassium: 3.6 mmol/L (ref 3.5–5.1)
Sodium: 140 mmol/L (ref 135–145)
Total Bilirubin: 0.8 mg/dL (ref 0.3–1.2)
Total Protein: 6.4 g/dL — ABNORMAL LOW (ref 6.5–8.1)

## 2019-02-09 LAB — CBC
HCT: 29.3 % — ABNORMAL LOW (ref 36.0–46.0)
Hemoglobin: 9.4 g/dL — ABNORMAL LOW (ref 12.0–15.0)
MCH: 30.5 pg (ref 26.0–34.0)
MCHC: 32.1 g/dL (ref 30.0–36.0)
MCV: 95.1 fL (ref 80.0–100.0)
Platelets: 204 10*3/uL (ref 150–400)
RBC: 3.08 MIL/uL — ABNORMAL LOW (ref 3.87–5.11)
RDW: 13.7 % (ref 11.5–15.5)
WBC: 3.6 10*3/uL — ABNORMAL LOW (ref 4.0–10.5)
nRBC: 0 % (ref 0.0–0.2)

## 2019-02-09 LAB — PHOSPHORUS: Phosphorus: 3.2 mg/dL (ref 2.5–4.6)

## 2019-02-09 LAB — SARS CORONAVIRUS 2 (TAT 6-24 HRS): SARS Coronavirus 2: NEGATIVE

## 2019-02-09 LAB — GLUCOSE, CAPILLARY: Glucose-Capillary: 91 mg/dL (ref 70–99)

## 2019-02-09 LAB — MAGNESIUM: Magnesium: 1.8 mg/dL (ref 1.7–2.4)

## 2019-02-09 MED ORDER — BISACODYL 10 MG RE SUPP
10.0000 mg | Freq: Once | RECTAL | Status: AC
  Administered 2019-02-09: 14:00:00 10 mg via RECTAL
  Filled 2019-02-09: qty 1

## 2019-02-09 NOTE — Progress Notes (Signed)
Pt has had 3 large loose BM this am. Samples sent to lab as ordered. SRP, RN

## 2019-02-09 NOTE — Progress Notes (Signed)
[facility] Progress Note  Patient: [name] [id number]   PCP: Marrian Salvage, FNP DOB: 11-Jul-1945   DOA: 02/08/2019   DOS: 02/09/2019   Date of Service: the patient was seen and examined on 02/09/2019  Chief Complaint  Patient presents with   Abnormal Lab   Spasms   Headache   Brief hospital course: Pt. with PMH of iatrogenic hypothyroidism, history of breast cancer, GERD, gout, obesity, short gut syndrome, peptic ulcer disease, diabetes mellitus type 2; presented with complain of leg cramps and abnormal labs, was found to have hypomagnesemia, hypokalemia, severe constipation and overflow diarrhea.  Currently further plan is continue stool softeners..  Subjective: Reports generalized fatigue.  Epigastric pain.  No nausea no vomiting.  Also reports sharp pain not associated with food.  No blood in the stool. Reports 3 large loose BM.  Passing gas.  Assessment and Plan: 1.  Short gut syndrome. Small intestinal bacterial overgrowth syndrome. Chronic diarrhea. Chronically on Lomotil. Acute epigastric abdominal pain. Patient is on cholestyramine as well as Lomotil at home. Patient is also on narcotics and amitriptyline. CT abdomen pelvis shows no significant evidence of acute abnormality. Mild omental thickening noted. On a personal review of the CT scan I do see significant amount of stool in her rectal vault.  Discussed with GI who reviewed the CT scan with me and agreed. Currently will perform manual disimpaction and suppository and monitor. Continue to hydrate and monitor electrolytes while this is being performed. Discontinue Lomotil and Questran.  2.  Hypokalemia. Hypomagnesemia. Replaced. Currently levels are normal. Monitor with daily replacement. Her amiloride is currently on hold.  Will resume tomorrow.  3.  Hypothyroidism. Continue Synthroid.  4. Type 2 Diabetes Mellitus, well controled with out complication Diet controlled. Sugars are  well controlled. Monitor.  5.  Vaginal bleeding. S/P hysterectomy. Currently no activating or delay the patient. Monitor.  6.  Obesity Body mass index is 35.37 kg/m.  Dietary consultation.  Diet: Cardiac diet  DVT Prophylaxis: Subcutaneous Lovenox  Advance goals of care discussion: Full code  Family Communication: family was present at bedside, at the time of interview. The pt provided permission to discuss medical plan with the family. Opportunity was given to ask question and all questions were answered satisfactorily.   Disposition:  Discharge to home.  Consultants: None, phone consultation with GI Procedures: None  Scheduled Meds:  allopurinol  100 mg Oral Daily   amitriptyline  50 mg Oral QHS   calcium-vitamin D  1 tablet Oral Daily   dicyclomine  20 mg Oral TID AC & HS   enoxaparin (LOVENOX) injection  40 mg Subcutaneous Q24H   fluticasone  2 spray Each Nare Daily   levothyroxine  200 mcg Oral QAC breakfast   loratadine  10 mg Oral Daily   pantoprazole  40 mg Oral BID AC   pravastatin  20 mg Oral q1800   Continuous Infusions:  sodium chloride 75 mL/hr at 02/09/19 0840   PRN Meds: acetaminophen **OR** acetaminophen, calcium carbonate, HYDROcodone-acetaminophen, methocarbamol, nitroGLYCERIN, ondansetron **OR** ondansetron (ZOFRAN) IV Antibiotics: Anti-infectives (From admission, onward)   None       Objective: Physical Exam: Vitals:   02/08/19 1830 02/08/19 1902 02/08/19 1906 02/09/19 0502  BP: (!) 143/103 (!) 146/98  123/79  Pulse: 64 63  64  Resp: 16 16  16   Temp:  97.7 F (36.5 C)  97.6 F (36.4 C)  TempSrc:  Oral  Oral  SpO2: 100% 100%  100%  Weight:  96.4 kg   Height:   5\' 5"  (1.651 m)     Intake/Output Summary (Last 24 hours) at 02/09/2019 1454 Last data filed at 02/09/2019 0958 Gross per 24 hour  Intake --  Output 300 ml  Net -300 ml   Filed Weights   02/08/19 1906  Weight: 96.4 kg   General: alert and oriented to time,  place, and person. Appear in mild distress, affect appropriate Eyes: PERRL, Conjunctiva normal ENT: Oral Mucosa Clear, moist  Neck: no JVD, no Abnormal Mass Or lumps Cardiovascular: S1 and S2 Present, no Murmur, peripheral pulses symmetrical Respiratory: good respiratory effort, Bilateral Air entry equal and Decreased, no signs of accessory muscle use, DictatingClear to Auscultation, no Crackles, no wheezes Abdomen: Bowel Sound present, Soft and mild tenderness, no hernia Skin: no rashes  Extremities: no Pedal edema, no calf tenderness Neurologic: without any new focal findings Gait not checked due to patient safety concerns  Data Reviewed: I have personally reviewed and interpreted daily labs, tele strips, imagings as discussed above. I reviewed all nursing notes, pharmacy notes, vitals, pertinent old records I have discussed plan of care as described above with RN and patient/family.  CBC: Recent Labs  Lab 02/07/19 1102 02/08/19 1715 02/09/19 0413  WBC 4.2 4.5 3.6*  NEUTROABS 2.2 2.5  --   HGB 10.4* 10.9* 9.4*  HCT 31.6* 33.8* 29.3*  MCV 92.4 95.2 95.1  PLT 239.0 243 0000000   Basic Metabolic Panel: Recent Labs  Lab 02/07/19 1102 02/08/19 0802 02/08/19 1715 02/09/19 0413  NA 143  --  139 140  K 3.5  --  3.2* 3.6  CL 105  --  103 107  CO2 27  --  26 23  GLUCOSE 86  --  87 91  BUN 17  --  16 14  CREATININE 1.04  --  1.10* 0.86  CALCIUM 8.3*  --  8.2* 7.4*  MG 0.8* 0.7*  --  1.8  PHOS  --   --   --  3.2    Liver Function Tests: Recent Labs  Lab 02/07/19 1102 02/08/19 1715 02/09/19 0413  AST 33 51* 40  ALT 33 41 36  ALKPHOS 55 62 53  BILITOT 0.5 0.7 0.8  PROT 7.0 8.0 6.4*  ALBUMIN 3.9 4.2 3.4*   No results for input(s): LIPASE, AMYLASE in the last 168 hours. No results for input(s): AMMONIA in the last 168 hours. Coagulation Profile: No results for input(s): INR, PROTIME in the last 168 hours. Cardiac Enzymes: No results for input(s): CKTOTAL, CKMB,  CKMBINDEX, TROPONINI in the last 168 hours. BNP (last 3 results) Recent Labs    02/16/18 1408 02/07/19 1102  PROBNP 60.0 115.0*   CBG: Recent Labs  Lab 02/09/19 0757  GLUCAP 91   Studies: Ct Abdomen Pelvis Wo Contrast  Result Date: 02/09/2019 CLINICAL DATA:  Abdominal pain and diarrhea EXAM: CT ABDOMEN AND PELVIS WITHOUT CONTRAST TECHNIQUE: Multidetector CT imaging of the abdomen and pelvis was performed following the standard protocol without IV contrast. COMPARISON:  04/21/2015 FINDINGS: Lower chest: No acute abnormality. Hepatobiliary: No focal liver abnormality is seen. Status post cholecystectomy. No biliary dilatation. Pancreas: Unremarkable. No pancreatic ductal dilatation or surrounding inflammatory changes. Spleen: Normal in size without focal abnormality. Adrenals/Urinary Tract: Adrenal glands are within normal limits bilaterally. Kidneys demonstrate mild fullness of the renal collecting systems and renal pelves although no ureteral dilatation is seen. Tiny renal stone is noted in the right lower pole nonobstructive in nature. The bladder is partially  distended. Stomach/Bowel: Fecal material is scattered throughout the colon without obstructive change. Mild diverticular changes noted. No diverticulitis is seen. Appendix is not well visualized and may have been surgically removed. Correlation with the patient's clinical history is recommended. No small bowel obstructive changes are seen. Some increased density is noted along the anterior omentum likely related to adhesions and incompletely distended loops of bowel. A small fluid collection is noted in the right lower quadrant decreased in size from the prior exam likely representing postoperative change. Vascular/Lymphatic: Aortic atherosclerosis. No enlarged abdominal or pelvic lymph nodes. Reproductive: Status post hysterectomy. No adnexal masses. Other: No abdominal wall hernia or abnormality. No abdominopelvic ascites. Musculoskeletal:  No acute or significant osseous findings. IMPRESSION: Postoperative changes are noted with some thickening of the anterior peritoneum slightly progressed from the prior exam with some unopacified loops of small bowel. No obstructive changes are seen. Scattered fecal material throughout the colon without obstructive change. Mild fullness of the collecting systems consistent with extrarenal pelves. No true obstructive lesion is noted. Electronically Signed   By: Inez Catalina M.D.   On: 02/09/2019 10:01     Time spent: 35 minutes  Author: Berle Mull, MD [facility] 02/09/2019 2:54 PM  To reach On-call, see care teams to locate the attending and reach out to them via [email or web address]. If 7PM-7AM, please contact night-coverage If you still have difficulty reaching the attending provider, please page the [facility] (Director on Call) for [facility] on amion for assistance.

## 2019-02-09 NOTE — Evaluation (Signed)
Physical Therapy Evaluation Patient Details Name: [name] MRN: [id number] DOB: [birth year] Today's Date: 02/09/2019   History of Present Illness  73 y.o. female with medical history significant of iatrogenic hypothyroidism, history of breast cancer, GERD, gout, obesity, short gut syndrome, peptic ulcer disease, diabetes mellitus type 2, and other comorbidities who presented to the ED with a chief complaint of worsening diarrhea for last 2 days.   Clinical Impression  Pt admitted with above diagnosis.  Pt currently with functional limitations due to the deficits listed below (see PT Problem List). Pt will benefit from skilled PT to increase their independence and safety with mobility to allow discharge to the venue listed below.  Pt ambulated to/from bathroom and then short distance in hallway.  Pt reports feeling a little better however RN brought meds for nausea.  Pt typically independent at home (reports occasional use of cane if needed).  Will assist with mobility in acute setting however anticipate good progress and pt to d/c home with no f/u PT needs.    Follow Up Recommendations No PT follow up    Equipment Recommendations  None recommended by PT    Recommendations for Other Services       Precautions / Restrictions Precautions Precautions: Fall Restrictions Weight Bearing Restrictions: No      Mobility  Bed Mobility Overal bed mobility: Modified Independent                Transfers Overall transfer level: Needs assistance Equipment used: None Transfers: Sit to/from Stand Sit to Stand: Supervision         General transfer comment: supervision for safety  Ambulation/Gait Ambulation/Gait assistance: Min guard Gait Distance (Feet): 120 Feet Assistive device: IV Pole Gait Pattern/deviations: Step-through pattern;Decreased stride length     General Gait Details: slow pace, steady with pushing IV pole, pt does use cane occasionally at home if needed,  distance to pt tolerance  Stairs            Wheelchair Mobility    Modified Rankin (Stroke Patients Only)       Balance Overall balance assessment: Needs assistance Sitting-balance support: No upper extremity supported;Feet supported Sitting balance-Leahy Scale: Fair     Standing balance support: No upper extremity supported Standing balance-Leahy Scale: Good Standing balance comment: pt used bathroom and washed hands without UE support                             Pertinent Vitals/Pain Pain Assessment: No/denies pain(c/o nausea though and RN gave meds) Faces Pain Scale: Hurts even more Pain Location: abdomen Pain Descriptors / Indicators: Aching;Sore;Guarding Pain Intervention(s): Limited activity within patient's tolerance;Monitored during session    Home Living Family/patient expects to be discharged to:: Private residence Living Arrangements: Alone Available Help at Discharge: Family Type of Home: House Home Access: Stairs to enter Entrance Stairs-Rails: Right Entrance Stairs-Number of Steps: 2-3 Home Layout: One level Home Equipment: Cane - single point Additional Comments: son present during eval    Prior Function Level of Independence: Independent(occasional use of cane)               Hand Dominance        Extremity/Trunk Assessment   Upper Extremity Assessment Upper Extremity Assessment: Overall WFL for tasks assessed;Generalized weakness    Lower Extremity Assessment Lower Extremity Assessment: Generalized weakness       Communication   Communication: No difficulties  Cognition Arousal/Alertness: Awake/alert Behavior During Therapy:  WFL for tasks assessed/performed Overall Cognitive Status: Within Functional Limits for tasks assessed                                        General Comments      Exercises     Assessment/Plan    PT Assessment Patient needs continued PT services  PT Problem List  Decreased strength;Decreased activity tolerance;Decreased knowledge of use of DME;Decreased mobility       PT Treatment Interventions Gait training;DME instruction;Therapeutic exercise;Balance training;Therapeutic activities;Functional mobility training;Stair training;Patient/family education    PT Goals (Current goals can be found in the Care Plan section)  Acute Rehab PT Goals Patient Stated Goal: feel better PT Goal Formulation: With patient Time For Goal Achievement: 02/16/19 Potential to Achieve Goals: Good    Frequency Min 3X/week   Barriers to discharge        Co-evaluation               AM-PAC PT "6 Clicks" Mobility  Outcome Measure Help needed turning from your back to your side while in a flat bed without using bedrails?: None Help needed moving from lying on your back to sitting on the side of a flat bed without using bedrails?: None Help needed moving to and from a bed to a chair (including a wheelchair)?: A Little Help needed standing up from a chair using your arms (e.g., wheelchair or bedside chair)?: A Little Help needed to walk in hospital room?: A Little Help needed climbing 3-5 steps with a railing? : A Little 6 Click Score: 20    End of Session Equipment Utilized During Treatment: Gait belt Activity Tolerance: Patient tolerated treatment well Patient left: in chair;with family/visitor present;with call bell/phone within reach(pt aware to call for assist for safety) Nurse Communication: Mobility status PT Visit Diagnosis: Difficulty in walking, not elsewhere classified (R26.2)    Time: [id number] PT Time Calculation (min) (ACUTE ONLY): 15 min   Charges:   PT Evaluation $PT Eval Low Complexity: [place], PT, DPT Acute Rehabilitation Services Office: [phone] Pager: [phone]  Trena Platt 02/09/2019, 1:24 PM

## 2019-02-09 NOTE — Progress Notes (Signed)
Patient aware of need for stool sample. Patient has not had any bowel movement since coming to unit.

## 2019-02-09 NOTE — Progress Notes (Signed)
OT Cancellation Note  Patient Details Name: [name] MRN: [id number] DOB: Nov 09, 1945   Cancelled Treatment:    Reason Eval/Treat Not Completed: Patient at procedure or test/ unavailable. Pt being transported to CT. Plan to reattempt in a bit.  Tyrone Schimke, OT Acute Rehabilitation Services Pager: [phone] Office: [phone]  02/09/2019, 8:57 AM

## 2019-02-09 NOTE — Plan of Care (Signed)
  Problem: Education: Goal: Knowledge of General Education information will improve Description: Including pain rating scale, medication(s)/side effects and non-pharmacologic comfort measures 02/09/2019 1614 by Zadie Rhine, RN Outcome: Progressing 02/09/2019 1611 by Zadie Rhine, RN Outcome: Progressing 02/09/2019 1523 by Zadie Rhine, RN Outcome: Progressing   Problem: Health Behavior/Discharge Planning: Goal: Ability to manage health-related needs will improve 02/09/2019 1614 by Zadie Rhine, RN Outcome: Progressing 02/09/2019 1611 by Zadie Rhine, RN Outcome: Progressing 02/09/2019 1523 by Zadie Rhine, RN Outcome: Progressing   Problem: Clinical Measurements: Goal: Ability to maintain clinical measurements within normal limits will improve 02/09/2019 1614 by Zadie Rhine, RN Outcome: Progressing 02/09/2019 1523 by Zadie Rhine, RN Outcome: Progressing Goal: Will remain free from infection Outcome: Progressing   Problem: Clinical Measurements: Goal: Will remain free from infection Outcome: Progressing

## 2019-02-09 NOTE — Plan of Care (Signed)
  Problem: Education: Goal: Knowledge of General Education information will improve Description: Including pain rating scale, medication(s)/side effects and non-pharmacologic comfort measures 02/09/2019 1614 by Zadie Rhine, RN Outcome: Progressing 02/09/2019 1611 by Zadie Rhine, RN Outcome: Progressing 02/09/2019 1523 by Zadie Rhine, RN Outcome: Progressing   Problem: Health Behavior/Discharge Planning: Goal: Ability to manage health-related needs will improve 02/09/2019 1614 by Zadie Rhine, RN Outcome: Progressing 02/09/2019 1611 by Zadie Rhine, RN Outcome: Progressing 02/09/2019 1523 by Zadie Rhine, RN Outcome: Progressing   Problem: Clinical Measurements: Goal: Ability to maintain clinical measurements within normal limits will improve 02/09/2019 1614 by Zadie Rhine, RN Outcome: Progressing 02/09/2019 1523 by Zadie Rhine, RN Outcome: Progressing Goal: Will remain free from infection Outcome: Progressing

## 2019-02-09 NOTE — Plan of Care (Signed)
  Problem: Education: Goal: Knowledge of General Education information will improve Description: Including pain rating scale, medication(s)/side effects and non-pharmacologic comfort measures 02/09/2019 1611 by Zadie Rhine, RN Outcome: Progressing 02/09/2019 1523 by Zadie Rhine, RN Outcome: Progressing   Problem: Health Behavior/Discharge Planning: Goal: Ability to manage health-related needs will improve 02/09/2019 1611 by Zadie Rhine, RN Outcome: Progressing 02/09/2019 1523 by Zadie Rhine, RN Outcome: Progressing   Problem: Clinical Measurements: Goal: Ability to maintain clinical measurements within normal limits will improve Outcome: Progressing

## 2019-02-09 NOTE — Plan of Care (Signed)

## 2019-02-09 NOTE — Evaluation (Signed)
Occupational Therapy Evaluation Patient Details Name: [name] MRN: [id number] DOB: 1946/01/11 Today's Date: 02/09/2019    History of Present Illness 73 y.o. female with medical history significant of iatrogenic hypothyroidism, history of breast cancer, GERD, gout, obesity, short gut syndrome, peptic ulcer disease, diabetes mellitus type 2, and other comorbidities who presented to the ED with a chief complaint of worsening diarrhea for last 2 days.    Clinical Impression   Pt admitted with the above diagnoses and presents with below problem list. Pt will benefit from continued acute OT to address the below listed deficits and maximize independence with basic ADLs prior to d/c home. PTA pt was mostly independent with ADLs, does endorse occasional use of cane depending on how she's feeling. Pt is currently min guard with LB ADLs and functional transfers/mobility. Will follow acutely.      Follow Up Recommendations  No OT follow up;Supervision - Intermittent    Equipment Recommendations  3 in 1 bedside commode    Recommendations for Other Services PT consult     Precautions / Restrictions Precautions Precautions: Fall Restrictions Weight Bearing Restrictions: No      Mobility Bed Mobility Overal bed mobility: Modified Independent                Transfers Overall transfer level: Needs assistance Equipment used: None Transfers: Sit to/from Stand Sit to Stand: Min guard         General transfer comment: to/from EOB, used rail for steadying assist    Balance Overall balance assessment: Needs assistance Sitting-balance support: No upper extremity supported;Feet supported Sitting balance-Leahy Scale: Fair     Standing balance support: Bilateral upper extremity supported;During functional activity Standing balance-Leahy Scale: Fair                             ADL either performed or assessed with clinical judgement   ADL Overall ADL's :  Needs assistance/impaired Eating/Feeding: Set up;Sitting   Grooming: Min guard;Standing;Oral care   Upper Body Bathing: Set up;Sitting   Lower Body Bathing: Min guard;Sit to/from stand   Upper Body Dressing : Set up;Sitting   Lower Body Dressing: Min guard;Sit to/from stand   Toilet Transfer: Min guard   [place] and Hygiene: Min guard;Sit to/from stand   Tub/ Shower Transfer: Min guard;Ambulation   Functional mobility during ADLs: Min guard General ADL Comments: Pt completed mobility to/from bathroom, stood to complete oral care. Noted to seek single extremity support during dynamic standing tasks. Pt reports she can advance feet up to access in seated position for LB ADLs to avoid trunk flexion for comfort.     Vision         Perception     Praxis      Pertinent Vitals/Pain Pain Assessment: Faces Faces Pain Scale: Hurts even more Pain Location: abdomen Pain Descriptors / Indicators: Aching;Sore;Guarding Pain Intervention(s): Limited activity within patient's tolerance;Monitored during session     Hand Dominance     Extremity/Trunk Assessment Upper Extremity Assessment Upper Extremity Assessment: Overall WFL for tasks assessed;Generalized weakness   Lower Extremity Assessment Lower Extremity Assessment: Defer to PT evaluation       Communication Communication Communication: No difficulties   Cognition Arousal/Alertness: Awake/alert Behavior During Therapy: WFL for tasks assessed/performed Overall Cognitive Status: Within Functional Limits for tasks assessed  General Comments       Exercises     Shoulder Instructions      Home Living Family/patient expects to be discharged to:: Private residence Living Arrangements: Alone Available Help at Discharge: Family   Home Access: Level entry     [place]: One level     Bathroom Shower/Tub: [place]: Kasandra Knudsen - single point   Additional Comments: son present during eval      Prior Functioning/Environment Level of Independence: Independent(occasional use of cane)                 OT Problem List: Impaired balance (sitting and/or standing);Decreased knowledge of use of DME or AE;Decreased knowledge of precautions;Pain      OT Treatment/Interventions: Self-care/ADL training;DME and/or AE instruction;Therapeutic activities;Patient/family education;Balance training    OT Goals(Current goals can be found in the care plan section) Acute Rehab OT Goals Patient Stated Goal: feel better OT Goal Formulation: With patient Time For Goal Achievement: 02/23/19 Potential to Achieve Goals: Good ADL Goals Pt Will Perform Grooming: Independently;standing Pt Will Perform Lower Body Bathing: with modified independence;sit to/from stand Pt Will Perform Lower Body Dressing: with modified independence;sit to/from stand Pt Will Perform Tub/Shower Transfer: Independently;ambulating;with modified independence;3 in 1  OT Frequency: Min 2X/week   Barriers to D/C:            Co-evaluation              AM-PAC OT "6 Clicks" Daily Activity     Outcome Measure Help from another person eating meals?: None Help from another person taking care of personal grooming?: None Help from another person toileting, which includes using toliet, bedpan, or urinal?: None Help from another person bathing (including washing, rinsing, drying)?: A Little Help from another person to put on and taking off regular upper body clothing?: None Help from another person to put on and taking off regular lower body clothing?: None 6 Click Score: 23   End of Session    Activity Tolerance: Patient tolerated treatment well;Patient limited by pain Patient left: in bed;with call bell/phone within reach;with family/visitor present  OT Visit Diagnosis: Unsteadiness on feet (R26.81);Pain                Time:  [id number] OT Time Calculation (min): 17 min Charges:  OT General Charges $OT Visit: 1 Visit OT Evaluation $OT Eval Low Complexity: [place], OT Acute Rehabilitation Services Pager: [phone] Office: [phone]   Hortencia Pilar 02/09/2019, 11:21 AM

## 2019-02-10 ENCOUNTER — Inpatient Hospital Stay (HOSPITAL_COMMUNITY): Payer: Medicare Other

## 2019-02-10 DIAGNOSIS — K5904 Chronic idiopathic constipation: Secondary | ICD-10-CM

## 2019-02-10 LAB — GASTROINTESTINAL PANEL BY PCR, STOOL (REPLACES STOOL CULTURE)

## 2019-02-10 LAB — BASIC METABOLIC PANEL
Anion gap: 10 (ref 5–15)
BUN: 17 mg/dL (ref 8–23)
CO2: 21 mmol/L — ABNORMAL LOW (ref 22–32)
Calcium: 8 mg/dL — ABNORMAL LOW (ref 8.9–10.3)
Chloride: 106 mmol/L (ref 98–111)
Creatinine, Ser: 1.07 mg/dL — ABNORMAL HIGH (ref 0.44–1.00)
GFR calc Af Amer: 59 mL/min — ABNORMAL LOW (ref >60)
GFR calc non Af Amer: 51 mL/min — ABNORMAL LOW (ref >60)
Glucose, Bld: 94 mg/dL (ref 70–99)
Potassium: 4.2 mmol/L (ref 3.5–5.1)
Sodium: 137 mmol/L (ref 135–145)

## 2019-02-10 LAB — CBC
HCT: 29.5 % — ABNORMAL LOW (ref 36.0–46.0)
Hemoglobin: 9.1 g/dL — ABNORMAL LOW (ref 12.0–15.0)
MCH: 29.8 pg (ref 26.0–34.0)
MCHC: 30.8 g/dL (ref 30.0–36.0)
MCV: 96.7 fL (ref 80.0–100.0)
Platelets: 204 10*3/uL (ref 150–400)
RBC: 3.05 MIL/uL — ABNORMAL LOW (ref 3.87–5.11)
RDW: 13.7 % (ref 11.5–15.5)
WBC: 4 10*3/uL (ref 4.0–10.5)
nRBC: 0 % (ref 0.0–0.2)

## 2019-02-10 LAB — MAGNESIUM: Magnesium: 1.6 mg/dL — ABNORMAL LOW (ref 1.7–2.4)

## 2019-02-10 LAB — GLUCOSE, CAPILLARY
Glucose-Capillary: 121 mg/dL — ABNORMAL HIGH (ref 70–99)
Glucose-Capillary: 66 mg/dL — ABNORMAL LOW (ref 70–99)

## 2019-02-10 MED ORDER — POLYETHYLENE GLYCOL 3350 17 G PO PACK
17.0000 g | PACK | Freq: Every day | ORAL | Status: DC
  Administered 2019-02-10 – 2019-02-14 (×5): 17 g via ORAL
  Filled 2019-02-10 (×5): qty 1

## 2019-02-10 MED ORDER — MAGNESIUM SULFATE 2 GM/50ML IV SOLN
2.0000 g | Freq: Once | INTRAVENOUS | Status: AC
  Administered 2019-02-10: 2 g via INTRAVENOUS
  Filled 2019-02-10: qty 50

## 2019-02-10 MED ORDER — SENNOSIDES-DOCUSATE SODIUM 8.6-50 MG PO TABS
1.0000 | ORAL_TABLET | Freq: Two times a day (BID) | ORAL | Status: DC
  Administered 2019-02-10 – 2019-02-14 (×9): 1 via ORAL
  Filled 2019-02-10 (×9): qty 1

## 2019-02-10 NOTE — Progress Notes (Signed)
Physical Therapy Treatment Patient Details Name: [name] MRN: [id number] DOB: [birth year] Today's Date: 02/10/2019    History of Present Illness 73 y.o. female with medical history significant of iatrogenic hypothyroidism, history of breast cancer, GERD, gout, obesity, short gut syndrome, peptic ulcer disease, diabetes mellitus type 2, and other comorbidities who presented to the ED with a chief complaint of worsening diarrhea for last 2 days.     PT Comments    Pt is progressing well with mobility, she ambulated 200' holding IV pole for support.   Follow Up Recommendations  No PT follow up     Equipment Recommendations  None recommended by PT    Recommendations for Other Services       Precautions / Restrictions Precautions Precautions: Fall Restrictions Weight Bearing Restrictions: No    Mobility  Bed Mobility               General bed mobility comments: up in recliner  Transfers Overall transfer level: Needs assistance Equipment used: None Transfers: Sit to/from Stand Sit to Stand: Modified independent (Device/Increase time)         General transfer comment: supervision for safety  Ambulation/Gait Ambulation/Gait assistance: Supervision Gait Distance (Feet): 200 Feet Assistive device: IV Pole Gait Pattern/deviations: Step-through pattern;Decreased stride length     General Gait Details: steady with pushing IV pole, pt does use cane occasionally at home if needed, distance to pt tolerance   Stairs             Wheelchair Mobility    Modified Rankin (Stroke Patients Only)       Balance Overall balance assessment: Needs assistance Sitting-balance support: No upper extremity supported;Feet supported Sitting balance-Leahy Scale: Fair     Standing balance support: Bilateral upper extremity supported;During functional activity Standing balance-Leahy Scale: Fair                              Cognition  Arousal/Alertness: Awake/alert Behavior During Therapy: WFL for tasks assessed/performed Overall Cognitive Status: Within Functional Limits for tasks assessed                                        Exercises      General Comments        Pertinent Vitals/Pain Pain Assessment: 0-10 Pain Score: 6  Pain Location: abdomen Pain Descriptors / Indicators: Sore Pain Intervention(s): Limited activity within patient's tolerance;Monitored during session;Premedicated before session    Home Living                      Prior Function            PT Goals (current goals can now be found in the care plan section) Acute Rehab PT Goals Patient Stated Goal: volunteer at [employer] PT Goal Formulation: With patient Time For Goal Achievement: 02/16/19 Potential to Achieve Goals: Good Progress towards PT goals: Progressing toward goals    Frequency    Min 3X/week      PT Plan Current plan remains appropriate    Co-evaluation              AM-PAC PT "6 Clicks" Mobility   Outcome Measure  Help needed turning from your back to your side while in a flat bed without using bedrails?: None Help needed moving from lying on your back  to sitting on the side of a flat bed without using bedrails?: None Help needed moving to and from a bed to a chair (including a wheelchair)?: A Little Help needed standing up from a chair using your arms (e.g., wheelchair or bedside chair)?: A Little Help needed to walk in hospital room?: A Little Help needed climbing 3-5 steps with a railing? : A Little 6 Click Score: 20    End of Session Equipment Utilized During Treatment: Gait belt Activity Tolerance: Patient tolerated treatment well Patient left: in chair;with call bell/phone within reach(pt aware to call for assist for safety) Nurse Communication: Mobility status PT Visit Diagnosis: Difficulty in walking, not elsewhere classified (R26.2)     Time: [id number] PT  Time Calculation (min) (ACUTE ONLY): 12 min  Charges:  $Gait Training: 8-22 mins                    Blondell Reveal Kistler PT 02/10/2019  Acute Rehabilitation Services Pager [phone] Office [phone]

## 2019-02-10 NOTE — Progress Notes (Signed)
[facility] Progress Note  Patient: [name] [id number]   PCP: Marrian Salvage, FNP DOB: 1945-08-11   DOA: 02/08/2019   DOS: 02/10/2019   Date of Service: the patient was seen and examined on 02/10/2019  Chief Complaint  Patient presents with  . Abnormal Lab  . Spasms  . Headache   Brief hospital course: Pt. with PMH of iatrogenic hypothyroidism, history of breast cancer, GERD, gout, obesity, short gut syndrome, peptic ulcer disease, diabetes mellitus type 2; presented with complain of leg cramps and abnormal labs, was found to have hypomagnesemia, hypokalemia, severe constipation and overflow diarrhea. Currently the plan is providing stool softener and resolving extra constipation  Subjective: Reports nausea but no vomiting.  Severe epigastric pain also reported.  No fever no chills.  No blood in the stool.  Minimal oral intake.  Patient was actually hypoglycemic earlier this morning.  Assessment and Plan: 1.  Short gut syndrome. Small intestinal bacterial overgrowth syndrome. Chronic diarrhea. Chronically on Lomotil. Acute epigastric abdominal pain. Patient is on cholestyramine as well as Lomotil at home. Patient is also on narcotics and amitriptyline. CT abdomen pelvis shows no significant evidence of acute abnormality. Mild omental thickening noted. On a personal review of the CT scan I do see significant amount of stool in her rectal vault.  Discussed with GI who reviewed the CT scan with me and agreed. Repeat x-ray abdomen this morning reviewed personally still shows evidence of constipation.  Continue manual disimpaction. Increase stool softener. Continue to hydrate extensively. Discontinue Lomotil and Questran. May require discontinuation of the Levsin as well.  2.  Hypokalemia. Hypomagnesemia. Potassium and magnesium still relatively low. We will continue to replace. Continue to monitor. Continue to hold amiloride.  3.  Hypothyroidism.  Continue Synthroid.  4. Type 2 Diabetes Mellitus, well controled with out complication Hypoglycemia. Patient was actually hypoglycemic this morning. Otherwise her sugars are well controlled. Continue to monitor. Changing to regular diet.  5.  Vaginal bleeding. S/P hysterectomy. No active bleeding for now. Outpatient follow-up recommended. Monitor.  6.  Obesity Body mass index is 35.37 kg/m.  Dietary consultation.  Diet: Cardiac diet  DVT Prophylaxis: Subcutaneous Lovenox  Advance goals of care discussion: Full code  Family Communication: family was present at bedside, at the time of interview. The pt provided permission to discuss medical plan with the family. Opportunity was given to ask question and all questions were answered satisfactorily.   Disposition:  Discharge to home.  PT recommends no follow-up needed.  Consultants: None, phone consultation with GI Procedures: None  Scheduled Meds: . allopurinol  100 mg Oral Daily  . amitriptyline  50 mg Oral QHS  . calcium-vitamin D  1 tablet Oral Daily  . dicyclomine  20 mg Oral TID AC & HS  . enoxaparin (LOVENOX) injection  40 mg Subcutaneous Q24H  . fluticasone  2 spray Each Nare Daily  . levothyroxine  200 mcg Oral QAC breakfast  . loratadine  10 mg Oral Daily  . pantoprazole  40 mg Oral BID AC  . polyethylene glycol  17 g Oral Daily  . pravastatin  20 mg Oral q1800  . senna-docusate  1 tablet Oral BID   Continuous Infusions:  PRN Meds: acetaminophen **OR** acetaminophen, calcium carbonate, HYDROcodone-acetaminophen, methocarbamol, nitroGLYCERIN, ondansetron **OR** ondansetron (ZOFRAN) IV Antibiotics: Anti-infectives (From admission, onward)   None       Objective: Physical Exam: Vitals:   02/09/19 0502 02/09/19 1640 02/10/19 0653 02/10/19 1431  BP: 123/79 124/85 106/72 108/75  Pulse: 64 65 65 66  Resp: 16 17 16 16   Temp: 97.6 F (36.4 C) 97.8 F (36.6 C) 97.6 F (36.4 C) (!) 97.5 F (36.4 C)   TempSrc: Oral Oral Oral Oral  SpO2: 100% 100% 99% 100%  Weight:      Height:        Intake/Output Summary (Last 24 hours) at 02/10/2019 1816 Last data filed at 02/10/2019 1500 Gross per 24 hour  Intake 410 ml  Output -  Net 410 ml   Filed Weights   02/08/19 1906  Weight: 96.4 kg   General: alert and oriented to time, place, and person. Appear in mild distress, affect appropriate Eyes: PERRL, Conjunctiva normal ENT: Oral Mucosa Clear, moist  Neck: no JVD, no Abnormal Mass Or lumps Cardiovascular: S1 and S2 Present, no Murmur, peripheral pulses symmetrical Respiratory: good respiratory effort, Bilateral Air entry equal and Decreased, no signs of accessory muscle use, Clear to Auscultation, no Crackles, no wheezes Abdomen: Bowel Sound present, Soft and mild tenderness, no hernia Skin: no rashes  Extremities: no Pedal edema, no calf tenderness Neurologic: without any new focal findings Gait not checked due to patient safety concerns   Data Reviewed: I have personally reviewed and interpreted daily labs, tele strips, imagings as discussed above. I reviewed all nursing notes, pharmacy notes, vitals, pertinent old records I have discussed plan of care as described above with RN and patient/family.  CBC: Recent Labs  Lab 02/07/19 1102 02/08/19 1715 02/09/19 0413 02/10/19 0410  WBC 4.2 4.5 3.6* 4.0  NEUTROABS 2.2 2.5  --   --   HGB 10.4* 10.9* 9.4* 9.1*  HCT 31.6* 33.8* 29.3* 29.5*  MCV 92.4 95.2 95.1 96.7  PLT 239.0 243 204 0000000   Basic Metabolic Panel: Recent Labs  Lab 02/07/19 1102 02/08/19 0802 02/08/19 1715 02/09/19 0413 02/10/19 0410  NA 143  --  139 140 137  K 3.5  --  3.2* 3.6 4.2  CL 105  --  103 107 106  CO2 27  --  26 23 21*  GLUCOSE 86  --  87 91 94  BUN 17  --  16 14 17   CREATININE 1.04  --  1.10* 0.86 1.07*  CALCIUM 8.3*  --  8.2* 7.4* 8.0*  MG 0.8* 0.7*  --  1.8 1.6*  PHOS  --   --   --  3.2  --     Liver Function Tests: Recent Labs  Lab  02/07/19 1102 02/08/19 1715 02/09/19 0413  AST 33 51* 40  ALT 33 41 36  ALKPHOS 55 62 53  BILITOT 0.5 0.7 0.8  PROT 7.0 8.0 6.4*  ALBUMIN 3.9 4.2 3.4*   No results for input(s): LIPASE, AMYLASE in the last 168 hours. No results for input(s): AMMONIA in the last 168 hours. Coagulation Profile: No results for input(s): INR, PROTIME in the last 168 hours. Cardiac Enzymes: No results for input(s): CKTOTAL, CKMB, CKMBINDEX, TROPONINI in the last 168 hours. BNP (last 3 results) Recent Labs    02/16/18 1408 02/07/19 1102  PROBNP 60.0 115.0*   CBG: Recent Labs  Lab 02/09/19 0757 02/10/19 0749 02/10/19 0823  GLUCAP 91 66* 121*   Studies: Dg Abd Portable 1v  Result Date: 02/10/2019 CLINICAL DATA:  Constipation, nausea, and diarrhea, LEFT side and central abdominal pain for 1 week, history breast cancer, GERD, diabetes mellitus, peptic ulcer disease, short gut syndrome, kidney stones EXAM: PORTABLE ABDOMEN - 1 VIEW COMPARISON:  Portable exam 0826 hours compared  to CT abdomen and pelvis of 02/09/2019 FINDINGS: Retained contrast in colon to rectum. Nonobstructive bowel gas pattern. No bowel dilatation or bowel wall thickening. Bones demineralized. Numerous surgical clips in the paraspinal regions and upper pelvis. IMPRESSION: Nonobstructive bowel gas pattern. Electronically Signed   By: Lavonia Dana M.D.   On: 02/10/2019 10:53     Time spent: 35 minutes  Author: Berle Mull, MD [facility] 02/10/2019 6:16 PM  To reach On-call, see care teams to locate the attending and reach out to them via [email or web address]. If 7PM-7AM, please contact night-coverage If you still have difficulty reaching the attending provider, please page the [facility] (Director on Call) for [facility] on amion for assistance.

## 2019-02-11 ENCOUNTER — Inpatient Hospital Stay (HOSPITAL_COMMUNITY): Payer: Medicare Other

## 2019-02-11 LAB — BASIC METABOLIC PANEL
Anion gap: 6 (ref 5–15)
BUN: 17 mg/dL (ref 8–23)
CO2: 26 mmol/L (ref 22–32)
Calcium: 9.1 mg/dL (ref 8.9–10.3)
Chloride: 105 mmol/L (ref 98–111)
Creatinine, Ser: 1.06 mg/dL — ABNORMAL HIGH (ref 0.44–1.00)
GFR calc Af Amer: 60 mL/min — ABNORMAL LOW (ref >60)
GFR calc non Af Amer: 52 mL/min — ABNORMAL LOW (ref >60)
Glucose, Bld: 103 mg/dL — ABNORMAL HIGH (ref 70–99)
Potassium: 4.7 mmol/L (ref 3.5–5.1)
Sodium: 137 mmol/L (ref 135–145)

## 2019-02-11 LAB — CBC
HCT: 29.8 % — ABNORMAL LOW (ref 36.0–46.0)
Hemoglobin: 9.6 g/dL — ABNORMAL LOW (ref 12.0–15.0)
MCH: 30.3 pg (ref 26.0–34.0)
MCHC: 32.2 g/dL (ref 30.0–36.0)
MCV: 94 fL (ref 80.0–100.0)
Platelets: 189 10*3/uL (ref 150–400)
RBC: 3.17 MIL/uL — ABNORMAL LOW (ref 3.87–5.11)
RDW: 13.7 % (ref 11.5–15.5)
WBC: 3.4 10*3/uL — ABNORMAL LOW (ref 4.0–10.5)
nRBC: 0 % (ref 0.0–0.2)

## 2019-02-11 LAB — MAGNESIUM: Magnesium: 1.7 mg/dL (ref 1.7–2.4)

## 2019-02-11 LAB — GLUCOSE, CAPILLARY: Glucose-Capillary: 86 mg/dL (ref 70–99)

## 2019-02-11 MED ORDER — DICYCLOMINE HCL 20 MG PO TABS
20.0000 mg | ORAL_TABLET | Freq: Three times a day (TID) | ORAL | Status: DC | PRN
  Filled 2019-02-11: qty 1

## 2019-02-11 MED ORDER — MAGNESIUM OXIDE 400 (241.3 MG) MG PO TABS
400.0000 mg | ORAL_TABLET | Freq: Two times a day (BID) | ORAL | Status: DC
  Administered 2019-02-11 – 2019-02-13 (×6): 400 mg via ORAL
  Filled 2019-02-11 (×6): qty 1

## 2019-02-11 MED ORDER — MAGNESIUM CHLORIDE 64 MG PO TBEC
1.0000 | DELAYED_RELEASE_TABLET | Freq: Every day | ORAL | Status: DC
  Administered 2019-02-11: 64 mg via ORAL
  Filled 2019-02-11: qty 1

## 2019-02-11 MED ORDER — METOCLOPRAMIDE HCL 5 MG/ML IJ SOLN
5.0000 mg | Freq: Four times a day (QID) | INTRAMUSCULAR | Status: DC | PRN
  Administered 2019-02-11 – 2019-02-14 (×5): 5 mg via INTRAVENOUS
  Filled 2019-02-11 (×6): qty 2

## 2019-02-11 MED ORDER — HYDROXYZINE HCL 25 MG PO TABS
25.0000 mg | ORAL_TABLET | Freq: Three times a day (TID) | ORAL | Status: DC | PRN
  Administered 2019-02-13: 25 mg via ORAL
  Filled 2019-02-11: qty 1

## 2019-02-11 MED ORDER — MORPHINE SULFATE (PF) 2 MG/ML IV SOLN
1.0000 mg | INTRAVENOUS | Status: DC | PRN
  Administered 2019-02-11 (×2): 2 mg via INTRAVENOUS
  Administered 2019-02-11 – 2019-02-12 (×3): 1 mg via INTRAVENOUS
  Administered 2019-02-12: 05:00:00 2 mg via INTRAVENOUS
  Filled 2019-02-11 (×6): qty 1

## 2019-02-11 NOTE — Progress Notes (Signed)
Pt states abd hurts after enema, spasm  " feel like contractions" medicated for pain, will f/u. SRP, RN

## 2019-02-11 NOTE — Progress Notes (Signed)
[facility] Progress Note  Patient: [name] [id number]   PCP: Marrian Salvage, FNP DOB: 1945-07-03   DOA: 02/08/2019   DOS: 02/11/2019   Date of Service: the patient was seen and examined on 02/11/2019  Chief Complaint  Patient presents with  . Abnormal Lab  . Spasms  . Headache   Brief hospital course: Pt. with PMH of iatrogenic hypothyroidism, history of breast cancer, GERD, gout, obesity, short gut syndrome, peptic ulcer disease, diabetes mellitus type 2; presented with complain of leg cramps and abnormal labs, was found to have hypomagnesemia, hypokalemia, severe constipation and overflow diarrhea. Currently further plan is continue stool softener and monitor improvement in symptoms  Subjective: Continues to report nausea.  Today her symptoms are more frequent.  No vomiting.  Also reports crampy abdominal pain which is diffuse.  Passing gas but no bowel movement since yesterday.  No fever no chills.  No shortness of breath.  Assessment and Plan: 1.  Short gut syndrome. Small intestinal bacterial overgrowth syndrome. Chronic diarrhea. Chronically on Lomotil. Acute epigastric abdominal pain. Patient is on cholestyramine as well as Lomotil at home. Patient is also on narcotics and amitriptyline. CT abdomen pelvis shows no significant evidence of acute abnormality. Mild omental thickening noted. On a personal review of the CT scan I do see significant amount of stool in her rectal vault.  Discussed with GI who reviewed the CT scan with me and agreed. Repeat x-ray on 02/11/2019 continues to show retained stool but significant improvement over yesterday. Enema x1 was successful with minimal bowel movement though. We will provide PRN Reglan, PRN morphine for pain control. Discontinue Levsin. Continue to hold Lomotil and Questran. Continue manual disimpaction as needed.  2.  Hypokalemia. Hypomagnesemia. We will replace orally. Continue to hold amiloride.   3.  Hypothyroidism. Continue Synthroid.  4. Type 2 Diabetes Mellitus, well controled with out complication Hypoglycemia. Patient was actually hypoglycemic this morning. Otherwise her sugars are well controlled. Continue to monitor. Changing to regular diet.  5.  Vaginal bleeding. S/P hysterectomy. No active bleeding for now. Outpatient follow-up recommended. Monitor.  6.  Obesity Body mass index is 35.37 kg/m.  Dietary consultation.  Diet: Cardiac diet  DVT Prophylaxis: Subcutaneous Lovenox  Advance goals of care discussion: Full code  Family Communication: no family was present at bedside, at the time of interview.  Discussed with son on 02/09/2019.  Disposition:  Discharge to home.  PT recommends no follow-up needed.  Consultants: None, phone consultation with GI Procedures: None  Scheduled Meds: . allopurinol  100 mg Oral Daily  . amitriptyline  50 mg Oral QHS  . enoxaparin (LOVENOX) injection  40 mg Subcutaneous Q24H  . fluticasone  2 spray Each Nare Daily  . levothyroxine  200 mcg Oral QAC breakfast  . magnesium oxide  400 mg Oral BID  . pantoprazole  40 mg Oral BID AC  . polyethylene glycol  17 g Oral Daily  . senna-docusate  1 tablet Oral BID   Continuous Infusions:  PRN Meds: acetaminophen **OR** acetaminophen, calcium carbonate, dicyclomine, HYDROcodone-acetaminophen, hydrOXYzine, methocarbamol, metoCLOPramide (REGLAN) injection, morphine injection, nitroGLYCERIN, [DISCONTINUED] ondansetron **OR** ondansetron (ZOFRAN) IV Antibiotics: Anti-infectives (From admission, onward)   None       Objective: Physical Exam: Vitals:   02/10/19 1431 02/10/19 2114 02/11/19 0536 02/11/19 1436  BP: 108/75 113/77 107/72 111/88  Pulse: 66 73 68 79  Resp: 16 16 16 16   Temp: (!) 97.5 F (36.4 C) 98.6 F (37 C) 97.7 F (36.5  C) 97.7 F (36.5 C)  TempSrc: Oral Oral Oral Oral  SpO2: 100% 96% 98% 100%  Weight:      Height:        Intake/Output Summary (Last 24  hours) at 02/11/2019 1803 Last data filed at 02/11/2019 0900 Gross per 24 hour  Intake 360 ml  Output -  Net 360 ml   Filed Weights   02/08/19 1906  Weight: 96.4 kg   General: alert and oriented to time, place, and person. Appear in moderate distress, affect appropriate Eyes: PERRL, Conjunctiva normal ENT: Oral Mucosa Clear, dry  Neck: no JVD, no Abnormal Mass Or lumps Cardiovascular: S1 and S2 Present, no Murmur, peripheral pulses symmetrical Respiratory: good respiratory effort, Bilateral Air entry equal and Decreased, no signs of accessory muscle use, Clear to Auscultation, no Crackles, no wheezes Abdomen: Bowel Sound present, Soft and moderate diffuse tenderness, no hernia Skin: no rashes  Extremities: no Pedal edema, no calf tenderness Neurologic: without any new focal findings Gait not checked due to patient safety concerns   Data Reviewed: I have personally reviewed and interpreted daily labs, tele strips, imagings as discussed above. I reviewed all nursing notes, pharmacy notes, vitals, pertinent old records I have discussed plan of care as described above with RN and patient/family.  CBC: Recent Labs  Lab 02/07/19 1102 02/08/19 1715 02/09/19 0413 02/10/19 0410 02/11/19 0411  WBC 4.2 4.5 3.6* 4.0 3.4*  NEUTROABS 2.2 2.5  --   --   --   HGB 10.4* 10.9* 9.4* 9.1* 9.6*  HCT 31.6* 33.8* 29.3* 29.5* 29.8*  MCV 92.4 95.2 95.1 96.7 94.0  PLT 239.0 243 204 204 [id number]   Basic Metabolic Panel: Recent Labs  Lab 02/07/19 1102 02/08/19 0802 02/08/19 1715 02/09/19 0413 02/10/19 0410 02/11/19 0411  NA 143  --  139 140 137 137  K 3.5  --  3.2* 3.6 4.2 4.7  CL 105  --  103 107 106 105  CO2 27  --  26 23 21* 26  GLUCOSE 86  --  87 91 94 103*  BUN 17  --  16 14 17 17   CREATININE 1.04  --  1.10* 0.86 1.07* 1.06*  CALCIUM 8.3*  --  8.2* 7.4* 8.0* 9.1  MG 0.8* 0.7*  --  1.8 1.6* 1.7  PHOS  --   --   --  3.2  --   --     Liver Function Tests: Recent Labs  Lab 02/07/19  1102 02/08/19 1715 02/09/19 0413  AST 33 51* 40  ALT 33 41 36  ALKPHOS 55 62 53  BILITOT 0.5 0.7 0.8  PROT 7.0 8.0 6.4*  ALBUMIN 3.9 4.2 3.4*   No results for input(s): LIPASE, AMYLASE in the last 168 hours. No results for input(s): AMMONIA in the last 168 hours. Coagulation Profile: No results for input(s): INR, PROTIME in the last 168 hours. Cardiac Enzymes: No results for input(s): CKTOTAL, CKMB, CKMBINDEX, TROPONINI in the last 168 hours. BNP (last 3 results) Recent Labs    02/16/18 1408 02/07/19 1102  PROBNP 60.0 115.0*   CBG: Recent Labs  Lab 02/09/19 0757 02/10/19 0749 02/10/19 0823 02/11/19 0739  GLUCAP 91 66* 121* 86   Studies: Dg Abd Portable 1v  Result Date: 02/11/2019 CLINICAL DATA:  Intractable nausea and vomiting with abdominal pain 2-3 weeks. EXAM: PORTABLE ABDOMEN - 1 VIEW COMPARISON:  02/10/2019 FINDINGS: Bowel gas pattern is nonobstructive with mild fecal retention throughout the colon. Surgical clips over the lower abdomen/pelvis  and right upper quadrant. Remainder the exam is unchanged. IMPRESSION: Nonobstructive bowel gas pattern. Electronically Signed   By: Marin Olp M.D.   On: 02/11/2019 15:26     Time spent: 35 minutes  Author: Berle Mull, MD [facility] 02/11/2019 6:03 PM  To reach On-call, see care teams to locate the attending and reach out to them via [email or web address]. If 7PM-7AM, please contact night-coverage If you still have difficulty reaching the attending provider, please page the [facility] (Director on Call) for [facility] on amion for assistance.

## 2019-02-11 NOTE — Progress Notes (Signed)
Pt given soap suds enema (1063ml) enema return with very little solid stool. Pt c/o on mild discomfort on the lower left abd. MD updated, will follow up. SRP, RN

## 2019-02-11 NOTE — Plan of Care (Signed)
  Problem: Activity: Goal: Risk for activity intolerance will decrease Outcome: Progressing   Problem: Safety: Goal: Ability to remain free from injury will improve Outcome: Progressing   

## 2019-02-11 NOTE — Progress Notes (Signed)
Soap sud 250 ml enema given, pt tol well with air and small amount of stool return. Pt did want the 2nd half or enema. Pt given Reglan for nausea. SRP, RN

## 2019-02-12 ENCOUNTER — Inpatient Hospital Stay (HOSPITAL_COMMUNITY): Payer: Medicare Other

## 2019-02-12 LAB — CBC
HCT: 30.7 % — ABNORMAL LOW (ref 36.0–46.0)
Hemoglobin: 9.6 g/dL — ABNORMAL LOW (ref 12.0–15.0)
MCH: 30 pg (ref 26.0–34.0)
MCHC: 31.3 g/dL (ref 30.0–36.0)
MCV: 95.9 fL (ref 80.0–100.0)
Platelets: 211 10*3/uL (ref 150–400)
RBC: 3.2 MIL/uL — ABNORMAL LOW (ref 3.87–5.11)
RDW: 13.6 % (ref 11.5–15.5)
WBC: 3.4 10*3/uL — ABNORMAL LOW (ref 4.0–10.5)
nRBC: 0 % (ref 0.0–0.2)

## 2019-02-12 LAB — GLUCOSE, CAPILLARY: Glucose-Capillary: 85 mg/dL (ref 70–99)

## 2019-02-12 LAB — BASIC METABOLIC PANEL
Anion gap: 13 (ref 5–15)
BUN: 19 mg/dL (ref 8–23)
CO2: 24 mmol/L (ref 22–32)
Calcium: 9.5 mg/dL (ref 8.9–10.3)
Chloride: 100 mmol/L (ref 98–111)
Creatinine, Ser: 1.13 mg/dL — ABNORMAL HIGH (ref 0.44–1.00)
GFR calc Af Amer: 55 mL/min — ABNORMAL LOW (ref >60)
GFR calc non Af Amer: 48 mL/min — ABNORMAL LOW (ref >60)
Glucose, Bld: 86 mg/dL (ref 70–99)
Potassium: 4.7 mmol/L (ref 3.5–5.1)
Sodium: 137 mmol/L (ref 135–145)

## 2019-02-12 LAB — MAGNESIUM: Magnesium: 1.8 mg/dL (ref 1.7–2.4)

## 2019-02-12 MED ORDER — LACTULOSE 10 GM/15ML PO SOLN
20.0000 g | Freq: Two times a day (BID) | ORAL | Status: DC
  Administered 2019-02-12 – 2019-02-14 (×4): 20 g via ORAL
  Filled 2019-02-12 (×5): qty 30

## 2019-02-12 MED ORDER — SORBITOL 70 % SOLN
960.0000 mL | TOPICAL_OIL | Freq: Once | ORAL | Status: DC
  Filled 2019-02-12: qty 473

## 2019-02-12 NOTE — Plan of Care (Signed)

## 2019-02-12 NOTE — Progress Notes (Signed)
[facility] Progress Note  Patient: [name] [id number]   PCP: Marrian Salvage, FNP DOB: 08-25-1945   DOA: 02/08/2019   DOS: 02/12/2019   Date of Service: the patient was seen and examined on 02/12/2019  Chief Complaint  Patient presents with  . Abnormal Lab  . Spasms  . Headache   Brief hospital course: Pt. with PMH of iatrogenic hypothyroidism, history of breast cancer, GERD, gout, obesity, short gut syndrome, peptic ulcer disease, diabetes mellitus type 2; presented with complain of leg cramps and abnormal labs, was found to have hypomagnesemia, hypokalemia, severe constipation and overflow diarrhea. Currently further plan is continue stool softener.  Subjective: Patient continues to have nausea.  Also reports abdominal pain which is about the same as yesterday.  No fever no chills.  Passing gas.  No BM.  Assessment and Plan: 1.  Short gut syndrome. Small intestinal bacterial overgrowth syndrome. Chronic diarrhea. Chronically on Lomotil. Acute epigastric abdominal pain. Severe constipation Patient is on cholestyramine as well as Lomotil at home. Patient is also on narcotics and amitriptyline. CT abdomen pelvis shows no significant evidence of acute abnormality. Mild omental thickening noted. On a personal review of the CT scan I do see significant amount of stool in her rectal vault.  Discussed with GI who reviewed the CT scan with me and agreed. X-ray 02/12/2019 shows moderate fecal retention throughout the colon without any obstruction. Currently I will treat her with smog enema, Senokot 1 twice daily, MiraLAX and lactulose.  2.  Hypokalemia. Hypomagnesemia. We will replace orally. Continue to hold amiloride.  3.  Hypothyroidism. Continue Synthroid.  4. Type 2 Diabetes Mellitus, well controled with out complication Hypoglycemia. Patient was actually hypoglycemic this morning. Otherwise her sugars are well controlled. Continue to monitor.  Changing to regular diet.  5.  Vaginal bleeding. S/P hysterectomy. No active bleeding for now. Outpatient follow-up recommended. Monitor.  6.  Obesity Body mass index is 35.92 kg/m.  Dietary consultation.  Diet: Cardiac diet  DVT Prophylaxis: Subcutaneous Lovenox  Advance goals of care discussion: Full code  Family Communication: no family was present at bedside, at the time of interview.  Discussed with son on 02/09/2019.  Disposition:  Discharge to home.  PT recommends no follow-up needed.  Consultants: None, phone consultation with GI Procedures: None  Scheduled Meds: . allopurinol  100 mg Oral Daily  . amitriptyline  50 mg Oral QHS  . enoxaparin (LOVENOX) injection  40 mg Subcutaneous Q24H  . fluticasone  2 spray Each Nare Daily  . levothyroxine  200 mcg Oral QAC breakfast  . magnesium oxide  400 mg Oral BID  . pantoprazole  40 mg Oral BID AC  . polyethylene glycol  17 g Oral Daily  . senna-docusate  1 tablet Oral BID  . sorbitol, milk of mag, mineral oil, glycerin (SMOG) enema  960 mL Rectal Once   Continuous Infusions:  PRN Meds: acetaminophen **OR** acetaminophen, calcium carbonate, dicyclomine, HYDROcodone-acetaminophen, hydrOXYzine, methocarbamol, metoCLOPramide (REGLAN) injection, morphine injection, nitroGLYCERIN, [DISCONTINUED] ondansetron **OR** ondansetron (ZOFRAN) IV Antibiotics: Anti-infectives (From admission, onward)   None       Objective: Physical Exam: Vitals:   02/11/19 2157 02/12/19 0439 02/12/19 0700 02/12/19 1502  BP: 103/74 103/65  112/76  Pulse: 76 79  77  Resp: 16 16  16   Temp: 98 F (36.7 C) 98.1 F (36.7 C)  98.1 F (36.7 C)  TempSrc: Oral Oral  Oral  SpO2: 99% 98%  99%  Weight:   97.9 kg  Height:        Intake/Output Summary (Last 24 hours) at 02/12/2019 1509 Last data filed at 02/12/2019 0619 Gross per 24 hour  Intake 180 ml  Output -  Net 180 ml   Filed Weights   02/08/19 1906 02/12/19 0700  Weight: 96.4 kg 97.9  kg   General: alert and oriented to time, place, and person. Appear in moderate distress, affect appropriate Eyes: PERRL, Conjunctiva normal ENT: Oral Mucosa Clear, moist  Neck: no JVD, no Abnormal Mass Or lumps Cardiovascular: S1 and S2 Present, no Murmur, peripheral pulses symmetrical Respiratory: good respiratory effort, Bilateral Air entry equal and Decreased, no signs of accessory muscle use, Clear to Auscultation, no Crackles, no wheezes Abdomen: Bowel Sound present, Soft and mild diffuse tenderness, no hernia Skin: no rashes  Extremities: no Pedal edema, no calf tenderness Neurologic: without any new focal findings Gait not checked due to patient safety concerns   Data Reviewed: I have personally reviewed and interpreted daily labs, tele strips, imagings as discussed above. I reviewed all nursing notes, pharmacy notes, vitals, pertinent old records I have discussed plan of care as described above with RN and patient/family.  CBC: Recent Labs  Lab 02/07/19 1102 02/08/19 1715 02/09/19 0413 02/10/19 0410 02/11/19 0411 02/12/19 0446  WBC 4.2 4.5 3.6* 4.0 3.4* 3.4*  NEUTROABS 2.2 2.5  --   --   --   --   HGB 10.4* 10.9* 9.4* 9.1* 9.6* 9.6*  HCT 31.6* 33.8* 29.3* 29.5* 29.8* 30.7*  MCV 92.4 95.2 95.1 96.7 94.0 95.9  PLT 239.0 243 204 204 189 [id number]   Basic Metabolic Panel: Recent Labs  Lab 02/08/19 0802 02/08/19 1715 02/09/19 0413 02/10/19 0410 02/11/19 0411 02/12/19 0446  NA  --  139 140 137 137 137  K  --  3.2* 3.6 4.2 4.7 4.7  CL  --  103 107 106 105 100  CO2  --  26 23 21* 26 24  GLUCOSE  --  87 91 94 103* 86  BUN  --  16 14 17 17 19   CREATININE  --  1.10* 0.86 1.07* 1.06* 1.13*  CALCIUM  --  8.2* 7.4* 8.0* 9.1 9.5  MG 0.7*  --  1.8 1.6* 1.7 1.8  PHOS  --   --  3.2  --   --   --     Liver Function Tests: Recent Labs  Lab 02/07/19 1102 02/08/19 1715 02/09/19 0413  AST 33 51* 40  ALT 33 41 36  ALKPHOS 55 62 53  BILITOT 0.5 0.7 0.8  PROT 7.0 8.0 6.4*   ALBUMIN 3.9 4.2 3.4*   No results for input(s): LIPASE, AMYLASE in the last 168 hours. No results for input(s): AMMONIA in the last 168 hours. Coagulation Profile: No results for input(s): INR, PROTIME in the last 168 hours. Cardiac Enzymes: No results for input(s): CKTOTAL, CKMB, CKMBINDEX, TROPONINI in the last 168 hours. BNP (last 3 results) Recent Labs    02/16/18 1408 02/07/19 1102  PROBNP 60.0 115.0*   CBG: Recent Labs  Lab 02/09/19 0757 02/10/19 0749 02/10/19 0823 02/11/19 0739 02/12/19 0749  GLUCAP 91 66* 121* 86 85   Studies: Dg Abd Acute 2+v W 1v Chest  Result Date: 02/12/2019 CLINICAL DATA:  Continued nausea and left-sided abdominal pain. EXAM: DG ABDOMEN ACUTE W/ 1V CHEST COMPARISON:  Chest x-ray 02/07/2019 and abdominal film 02/11/2019 FINDINGS: Lungs are adequately inflated without focal airspace consolidation or effusion. Cardiomediastinal silhouette and remainder of the chest is  unchanged. Abdominopelvic images demonstrate moderate fecal retention throughout the colon. No dilated small bowel loops. No free peritoneal air. Remainder of the exam is unchanged. IMPRESSION: Moderate fecal retention throughout the colon without evidence of obstruction. No acute cardiopulmonary disease. Electronically Signed   By: Marin Olp M.D.   On: 02/12/2019 13:35     Time spent: 35 minutes  Author: Berle Mull, MD [facility] 02/12/2019 3:09 PM  To reach On-call, see care teams to locate the attending and reach out to them via [email or web address]. If 7PM-7AM, please contact night-coverage If you still have difficulty reaching the attending provider, please page the [facility] (Director on Call) for [facility] on amion for assistance.

## 2019-02-13 LAB — BASIC METABOLIC PANEL
Anion gap: 10 (ref 5–15)
BUN: 19 mg/dL (ref 8–23)
CO2: 27 mmol/L (ref 22–32)
Calcium: 9.7 mg/dL (ref 8.9–10.3)
Chloride: 100 mmol/L (ref 98–111)
Creatinine, Ser: 1.13 mg/dL — ABNORMAL HIGH (ref 0.44–1.00)
GFR calc Af Amer: 55 mL/min — ABNORMAL LOW (ref >60)
GFR calc non Af Amer: 48 mL/min — ABNORMAL LOW (ref >60)
Glucose, Bld: 101 mg/dL — ABNORMAL HIGH (ref 70–99)
Potassium: 4.7 mmol/L (ref 3.5–5.1)
Sodium: 137 mmol/L (ref 135–145)

## 2019-02-13 LAB — CBC
HCT: 31.8 % — ABNORMAL LOW (ref 36.0–46.0)
Hemoglobin: 10.1 g/dL — ABNORMAL LOW (ref 12.0–15.0)
MCH: 30.6 pg (ref 26.0–34.0)
MCHC: 31.8 g/dL (ref 30.0–36.0)
MCV: 96.4 fL (ref 80.0–100.0)
Platelets: 203 10*3/uL (ref 150–400)
RBC: 3.3 MIL/uL — ABNORMAL LOW (ref 3.87–5.11)
RDW: 13.6 % (ref 11.5–15.5)
WBC: 3.5 10*3/uL — ABNORMAL LOW (ref 4.0–10.5)
nRBC: 0 % (ref 0.0–0.2)

## 2019-02-13 LAB — GLUCOSE, CAPILLARY: Glucose-Capillary: 81 mg/dL (ref 70–99)

## 2019-02-13 LAB — MAGNESIUM: Magnesium: 1.8 mg/dL (ref 1.7–2.4)

## 2019-02-13 NOTE — Care Management Important Message (Signed)
Important Message  Patient Details IM Letter given to Cookie McGibboney RN to present to the Patient Name: [name] MRN: [id number] Date of Birth: [birth year]   Medicare Important Message Given:  Yes     Kerin Salen 02/13/2019, 10:49 AM

## 2019-02-13 NOTE — Progress Notes (Signed)
[facility] Progress Note  Patient: [name] [id number]   PCP: Marrian Salvage, FNP DOB: 1945/06/20   DOA: 02/08/2019   DOS: 02/13/2019   Date of Service: the patient was seen and examined on 02/13/2019  Chief Complaint  Patient presents with  . Abnormal Lab  . Spasms  . Headache   Brief hospital course: Pt. with PMH of iatrogenic hypothyroidism, history of breast cancer, GERD, gout, obesity, short gut syndrome, peptic ulcer disease, diabetes mellitus type 2; presented with complain of leg cramps and abnormal labs, was found to have hypomagnesemia, hypokalemia, severe constipation and overflow diarrhea. Currently further plan is continue to resolve constipation  Subjective: Remains nauseated.  Abdominal pain is improving.  No fever no chills.  More bowel movement.  No blood in the stool.  Oral intake is improving as well.    Assessment and Plan: 1.  Short gut syndrome. Small intestinal bacterial overgrowth syndrome. Chronic diarrhea. Chronically on Lomotil. Acute epigastric abdominal pain. Severe constipation Patient is on cholestyramine as well as Lomotil at home. Patient is also on narcotics and amitriptyline. CT abdomen pelvis shows no significant evidence of acute abnormality. Mild omental thickening noted. On a personal review of the CT scan I do see significant amount of stool in her rectal vault.  Discussed with GI who reviewed the CT scan with me and agreed. X-ray 02/12/2019 shows moderate fecal retention throughout the colon without any obstruction. Patient had a bowel movement yesterday.  We will continue with stool softener.  Monitor closely.  Still symptomatic with minimal oral intake therefore I am concerned that the patient is not be ready for discharge today.  2.  Hypokalemia. Hypomagnesemia. We will replace orally. Continue to hold amiloride.  Do not think the patient will require amiloride at the time of the discharge.  3.  Hypothyroidism.  Continue Synthroid.  4. Type 2 Diabetes Mellitus, well controled with out complication Hypoglycemia. Patient was actually hypoglycemic this morning. Otherwise her sugars are well controlled. Continue to monitor. Changing to regular diet.  5.  Vaginal bleeding. S/P hysterectomy. No active bleeding for now. Outpatient follow-up recommended. Monitor.  6.  Obesity Body mass index is 35.92 kg/m.  Dietary consultation.  Diet: Cardiac diet  DVT Prophylaxis: Subcutaneous Lovenox  Advance goals of care discussion: Full code  Family Communication: no family was present at bedside, at the time of interview.  Discussed with son on 02/09/2019.  Disposition:  Discharge to home.  PT recommends no follow-up needed.  Likely home tomorrow on 02/14/2019.  Consultants: None, phone consultation with GI Procedures: None  Scheduled Meds: . allopurinol  100 mg Oral Daily  . amitriptyline  50 mg Oral QHS  . enoxaparin (LOVENOX) injection  40 mg Subcutaneous Q24H  . fluticasone  2 spray Each Nare Daily  . lactulose  20 g Oral BID  . levothyroxine  200 mcg Oral QAC breakfast  . magnesium oxide  400 mg Oral BID  . pantoprazole  40 mg Oral BID AC  . polyethylene glycol  17 g Oral Daily  . senna-docusate  1 tablet Oral BID   Continuous Infusions:  PRN Meds: acetaminophen **OR** acetaminophen, calcium carbonate, dicyclomine, HYDROcodone-acetaminophen, hydrOXYzine, methocarbamol, metoCLOPramide (REGLAN) injection, nitroGLYCERIN, [DISCONTINUED] ondansetron **OR** ondansetron (ZOFRAN) IV Antibiotics: Anti-infectives (From admission, onward)   None       Objective: Physical Exam: Vitals:   02/12/19 1502 02/12/19 2351 02/13/19 0538 02/13/19 1333  BP: 112/76 105/68 106/74 (!) 128/93  Pulse: 77 80 67 95  Resp:  16 18 18 20   Temp: 98.1 F (36.7 C) 97.7 F (36.5 C) (!) 97.5 F (36.4 C) 98.2 F (36.8 C)  TempSrc: Oral Oral Oral Oral  SpO2: 99% 98% 100% 100%  Weight:      Height:         Intake/Output Summary (Last 24 hours) at 02/13/2019 1828 Last data filed at 02/13/2019 1800 Gross per 24 hour  Intake 900 ml  Output -  Net 900 ml   Filed Weights   02/08/19 1906 02/12/19 0700  Weight: 96.4 kg 97.9 kg   General: alert and oriented to time, place, and person. Appear in mild distress, affect appropriate Eyes: PERRL, Conjunctiva normal ENT: Oral Mucosa Clear, moist  Neck: NO JVD, no Abnormal Mass Or lumps Cardiovascular: S1 and S2 Present, no Murmur, peripheral pulses symmetrical Respiratory: good respiratory effort, Bilateral Air entry equal and Decreased, no signs of accessory muscle use, Clear to Auscultation, no Crackles, no wheezes Abdomen: Bowel Sound present, Soft and mild tenderness, no hernia Skin: no ecchymoses  Extremities: no Pedal edema, no calf tenderness Neurologic: without any new focal findings Gait not checked due to patient safety concerns   Data Reviewed: I have personally reviewed and interpreted daily labs, tele strips, imagings as discussed above. I reviewed all nursing notes, pharmacy notes, vitals, pertinent old records I have discussed plan of care as described above with RN and patient/family.  CBC: Recent Labs  Lab 02/07/19 1102 02/08/19 1715 02/09/19 0413 02/10/19 0410 02/11/19 0411 02/12/19 0446 02/13/19 0549  WBC 4.2 4.5 3.6* 4.0 3.4* 3.4* 3.5*  NEUTROABS 2.2 2.5  --   --   --   --   --   HGB 10.4* 10.9* 9.4* 9.1* 9.6* 9.6* 10.1*  HCT 31.6* 33.8* 29.3* 29.5* 29.8* 30.7* 31.8*  MCV 92.4 95.2 95.1 96.7 94.0 95.9 96.4  PLT 239.0 243 204 204 189 211 [id number]   Basic Metabolic Panel: Recent Labs  Lab 02/09/19 0413 02/10/19 0410 02/11/19 0411 02/12/19 0446 02/13/19 0549  NA 140 137 137 137 137  K 3.6 4.2 4.7 4.7 4.7  CL 107 106 105 100 100  CO2 23 21* 26 24 27   GLUCOSE 91 94 103* 86 101*  BUN 14 17 17 19 19   CREATININE 0.86 1.07* 1.06* 1.13* 1.13*  CALCIUM 7.4* 8.0* 9.1 9.5 9.7  MG 1.8 1.6* 1.7 1.8 1.8  PHOS 3.2  --   --    --   --     Liver Function Tests: Recent Labs  Lab 02/07/19 1102 02/08/19 1715 02/09/19 0413  AST 33 51* 40  ALT 33 41 36  ALKPHOS 55 62 53  BILITOT 0.5 0.7 0.8  PROT 7.0 8.0 6.4*  ALBUMIN 3.9 4.2 3.4*   No results for input(s): LIPASE, AMYLASE in the last 168 hours. No results for input(s): AMMONIA in the last 168 hours. Coagulation Profile: No results for input(s): INR, PROTIME in the last 168 hours. Cardiac Enzymes: No results for input(s): CKTOTAL, CKMB, CKMBINDEX, TROPONINI in the last 168 hours. BNP (last 3 results) Recent Labs    02/16/18 1408 02/07/19 1102  PROBNP 60.0 115.0*   CBG: Recent Labs  Lab 02/10/19 0749 02/10/19 0823 02/11/19 0739 02/12/19 0749 02/13/19 0842  GLUCAP 66* 121* 86 85 81   Studies: No results found.   Time spent: 35 minutes  Author: Berle Mull, MD [facility] 02/13/2019 6:28 PM  To reach On-call, see care teams to locate the attending and reach out to them via  [email or web address] If 7PM-7AM, please contact night-coverage If you still have difficulty reaching the attending provider, please page the [facility] (Director on Call) for [facility] on amion for assistance.

## 2019-02-13 NOTE — Progress Notes (Signed)
Physical Therapy Treatment Patient Details Name: [name] MRN: [id number] DOB: 1946-01-13 Today's Date: 02/13/2019    History of Present Illness 73 y.o. female with medical history significant of iatrogenic hypothyroidism, history of breast cancer, GERD, gout, obesity, short gut syndrome, peptic ulcer disease, diabetes mellitus type 2, and other comorbidities who presented to the ED with a chief complaint of worsening diarrhea for last 2 days.     PT Comments    Pt ambulated in hallway without UE support today and tolerated good distance.  Pt anticipates d/c home tomorrow.  Follow Up Recommendations  No PT follow up     Equipment Recommendations  None recommended by PT    Recommendations for Other Services       Precautions / Restrictions Precautions Precautions: Fall Restrictions Weight Bearing Restrictions: No    Mobility  Bed Mobility               General bed mobility comments: up in recliner  Transfers Overall transfer level: Needs assistance Equipment used: None Transfers: Sit to/from Stand Sit to Stand: Supervision         General transfer comment: supervision for safety  Ambulation/Gait Ambulation/Gait assistance: Supervision Gait Distance (Feet): 220 Feet Assistive device: None Gait Pattern/deviations: Step-through pattern;Decreased stride length     General Gait Details: slow and slightly unsteady initially however improved with distance   Stairs             Wheelchair Mobility    Modified Rankin (Stroke Patients Only)       Balance                                            Cognition Arousal/Alertness: Awake/alert Behavior During Therapy: WFL for tasks assessed/performed Overall Cognitive Status: Within Functional Limits for tasks assessed                                        Exercises      General Comments        Pertinent Vitals/Pain Pain Assessment: No/denies pain     Home Living                      Prior Function            PT Goals (current goals can now be found in the care plan section) Progress towards PT goals: Progressing toward goals    Frequency    Min 3X/week      PT Plan Current plan remains appropriate    Co-evaluation              AM-PAC PT "6 Clicks" Mobility   Outcome Measure  Help needed turning from your back to your side while in a flat bed without using bedrails?: None Help needed moving from lying on your back to sitting on the side of a flat bed without using bedrails?: None Help needed moving to and from a bed to a chair (including a wheelchair)?: A Little Help needed standing up from a chair using your arms (e.g., wheelchair or bedside chair)?: A Little Help needed to walk in hospital room?: A Little Help needed climbing 3-5 steps with a railing? : A Little 6 Click Score: 20    End of Session Equipment Utilized  During Treatment: Gait belt Activity Tolerance: Patient tolerated treatment well Patient left: in chair;with call bell/phone within reach Nurse Communication: Mobility status PT Visit Diagnosis: Difficulty in walking, not elsewhere classified (R26.2)     Time: 1002-1010 PT Time Calculation (min) (ACUTE ONLY): 8 min  Charges:  $Gait Training: 8-22 mins                    Carmelia Bake, PT, DPT Acute Rehabilitation Services Office: [phone] Pager: [phone]  Trena Platt 02/13/2019, 2:43 PM

## 2019-02-13 NOTE — TOC Progression Note (Signed)
Transition of Care [facility]) - Progression Note    Patient Details  Name: [name] MRN: [id number] Date of Birth: 1945/10/01  Transition of [facility]) CM/SW Contact  Purcell Mouton, RN Phone Number: 02/13/2019, 4:11 PM  Clinical Narrative:    Pt states she will not need HH at present time.         Expected Discharge Plan and Services                                                 Social Determinants of Health (SDOH) Interventions    Readmission Risk Interventions No flowsheet data found.

## 2019-02-14 DIAGNOSIS — R112 Nausea with vomiting, unspecified: Secondary | ICD-10-CM

## 2019-02-14 LAB — GLUCOSE, CAPILLARY: Glucose-Capillary: 86 mg/dL (ref 70–99)

## 2019-02-14 LAB — CBC
HCT: 31.3 % — ABNORMAL LOW (ref 36.0–46.0)
Hemoglobin: 10 g/dL — ABNORMAL LOW (ref 12.0–15.0)
MCH: 30.4 pg (ref 26.0–34.0)
MCHC: 31.9 g/dL (ref 30.0–36.0)
MCV: 95.1 fL (ref 80.0–100.0)
Platelets: 208 10*3/uL (ref 150–400)
RBC: 3.29 MIL/uL — ABNORMAL LOW (ref 3.87–5.11)
RDW: 13.4 % (ref 11.5–15.5)
WBC: 3.7 10*3/uL — ABNORMAL LOW (ref 4.0–10.5)
nRBC: 0 % (ref 0.0–0.2)

## 2019-02-14 LAB — BASIC METABOLIC PANEL
Anion gap: 11 (ref 5–15)
BUN: 19 mg/dL (ref 8–23)
CO2: 26 mmol/L (ref 22–32)
Calcium: 9.6 mg/dL (ref 8.9–10.3)
Chloride: 99 mmol/L (ref 98–111)
Creatinine, Ser: 1.11 mg/dL — ABNORMAL HIGH (ref 0.44–1.00)
GFR calc Af Amer: 57 mL/min — ABNORMAL LOW (ref >60)
GFR calc non Af Amer: 49 mL/min — ABNORMAL LOW (ref >60)
Glucose, Bld: 107 mg/dL — ABNORMAL HIGH (ref 70–99)
Potassium: 4.2 mmol/L (ref 3.5–5.1)
Sodium: 136 mmol/L (ref 135–145)

## 2019-02-14 LAB — MAGNESIUM: Magnesium: 1.5 mg/dL — ABNORMAL LOW (ref 1.7–2.4)

## 2019-02-14 MED ORDER — MAGNESIUM OXIDE 400 (241.3 MG) MG PO TABS
800.0000 mg | ORAL_TABLET | Freq: Two times a day (BID) | ORAL | Status: DC
  Administered 2019-02-14: 10:00:00 800 mg via ORAL
  Filled 2019-02-14: qty 2

## 2019-02-14 MED ORDER — PANTOPRAZOLE SODIUM 40 MG PO TBEC: 40.0000 mg | DELAYED_RELEASE_TABLET | Freq: Every day | ORAL | 0 refills | Status: DC

## 2019-02-14 MED ORDER — DIPHENOXYLATE-ATROPINE 2.5-0.025 MG PO TABS: ORAL_TABLET | ORAL | 0 refills | Status: DC

## 2019-02-14 MED ORDER — MAGNESIUM OXIDE 400 (241.3 MG) MG PO TABS: 800.0000 mg | ORAL_TABLET | Freq: Two times a day (BID) | ORAL | 0 refills | Status: DC

## 2019-02-14 MED ORDER — METOCLOPRAMIDE HCL 5 MG PO TABS: 5.0000 mg | ORAL_TABLET | Freq: Three times a day (TID) | ORAL | 0 refills | Status: DC | PRN

## 2019-02-14 MED ORDER — DICYCLOMINE HCL 20 MG PO TABS: 20.0000 mg | ORAL_TABLET | Freq: Three times a day (TID) | ORAL | 3 refills | Status: DC | PRN

## 2019-02-14 MED ORDER — MAGNESIUM SULFATE 2 GM/50ML IV SOLN
2.0000 g | Freq: Once | INTRAVENOUS | Status: AC
  Administered 2019-02-14: 2 g via INTRAVENOUS
  Filled 2019-02-14: qty 50

## 2019-02-14 MED ORDER — POLYETHYLENE GLYCOL 3350 17 G PO PACK: 17.0000 g | PACK | Freq: Two times a day (BID) | ORAL | 0 refills | Status: DC

## 2019-02-14 MED ORDER — SENNOSIDES-DOCUSATE SODIUM 8.6-50 MG PO TABS: 1.0000 | ORAL_TABLET | Freq: Two times a day (BID) | ORAL | 0 refills | Status: DC

## 2019-02-14 NOTE — Discharge Summary (Signed)
[facility] Discharge Summary   Patient: [name] [id number]   PCP: Marrian Salvage, FNP DOB: 1945/12/04   Date of admission: 02/08/2019   Date of discharge: 02/14/2019     Discharge Diagnoses:  Principal diagnosis Severe constipation   Active Problems:   Hypothyroidism following radioiodine therapy   Vitamin B 12 deficiency   Dyslipidemia   Gout   Esophageal reflux   Diarrhea   Abdominal pain   Edema   Primary cancer of lower-inner quadrant of left female breast ([place])   Hypomagnesemia   Hypokalemia   Generalized weakness   Iron deficiency anemia   Short gut syndrome   Admitted From: home Disposition:  Home   Recommendations for Outpatient Follow-up:  1. PCP: follow up on constipation 2. Follow up LABS/TEST:  Repeat BMP and Mg  Follow-up Information    Marrian Salvage, FNP. Schedule an appointment as soon as possible for a visit in 1 week(s).   Specialty: Internal Medicine Contact information: [place] [id number] [phone]          Diet recommendation: Cardiac diet  Activity: The patient is advised to gradually reintroduce usual activities,as tolerated.  Discharge Condition: good  Code Status: Full code   History of present illness: As per the H and P dictated on admission, "[name] is a 73 y.o. female with medical history significant of iatrogenic hypothyroidism, history of breast cancer, GERD, gout, obesity, short gut syndrome, peptic ulcer disease, diabetes mellitus type 2, and other comorbidities who presented to the ED with a chief complaint of worsening diarrhea for last 2 days.  Patient has a history of short gut syndrome and has chronic diarrhea but states that it worsened 2 days ago and started having increased diarrhea and frequency of stooling.  She went to her PCP who evaluated her and she was found to have a low magnesium level and was given replacement.  Patient also found to have  some lower extremity swelling she was given Lasix and had a chest x-ray done.  Patient also complained to her PCP about post menopausal spotting and PCP ordered a CT of the abdomen to further evaluate and recommended a GYN follow-up however patient represented to her PCP office and because her magnesium level still low she was directed the ED for further evaluation.  Patient continues to have some diarrhea and has some associated symptoms with cramping, lightheadedness and dizziness along with some mild nausea.  Has not had any emesis or any chest pain.  Because of her history of short gut syndrome she is given IV magnesium in the ED and referred to [facility] for further evaluation for admission."  Hospital Course:  Summary of her active problems in the hospital is as following. 1.  Short gut syndrome. Small intestinal bacterial overgrowth syndrome. Chronic diarrhea. Chronically on Lomotil. Acute epigastric abdominal pain. Severe constipation Patient is on cholestyramine as well as Lomotil at home. Patient is also on narcotics and amitriptyline. CT abdomen pelvis shows no significant evidence of acute abnormality. Mild omental thickening noted. On a personal review of the CT scan I do see significant amount of stool in her rectal vault. Discussed with GI who reviewed the CT scan with me and agreed. X-ray 02/12/2019 shows moderate fecal retention throughout the colon without any obstruction. Patient had a bowel movement yesterday.  We will continue with stool softener.  2.  Hypokalemia. Hypomagnesemia. We replaced orally. Continue to hold amiloride.  Do not think the  patient will require amiloride at the time of the discharge.  3.  Hypothyroidism. Continue Synthroid.  4. Type 2 Diabetes Mellitus, well controled with out complication Hypoglycemia. Sugars are well controlled. Continue to monitor. Changing to regular diet.  5.  Vaginal bleeding. S/P hysterectomy. No active bleeding for  now. Outpatient follow-up recommended. Monitor.  6.  Obesity Body mass index is 35.92 kg/m.  Dietary consultation.  Patient was seen by physical therapy, who recommended no therapy needed on discharge,  On the day of the discharge the patient's vitals were stable, and no other acute medical condition were reported by patient. the patient was felt safe to be discharge at Home with no therapy needed on discharge.  Consultants: none Procedures: none  DISCHARGE MEDICATION: Allergies as of 02/14/2019      Reactions   Aspirin Other (See Comments)   REACTION: Gi Intolerance/ Burning in stomach   Iodinated Diagnostic Agents Itching   Trazodone And Nefazodone    "sick"   Flagyl [metronidazole] Rash   Iodine Itching   Allergic to IVP dye   Morphine And Related Itching      Medication List    STOP taking these medications   aMILoride 5 MG tablet Commonly known as: MIDAMOR   famotidine 40 MG tablet Commonly known as: PEPCID   Magnesium Oxide 400 MG Caps   metoprolol tartrate 50 MG tablet Commonly known as: LOPRESSOR   ondansetron 4 MG disintegrating tablet Commonly known as: ZOFRAN-ODT   predniSONE 50 MG tablet Commonly known as: DELTASONE   rifaximin 550 MG Tabs tablet Commonly known as: XIFAXAN     TAKE these medications   allopurinol 100 MG tablet Commonly known as: ZYLOPRIM Take 1 tablet (100 mg total) by mouth daily.   amitriptyline 50 MG tablet Commonly known as: ELAVIL Take 1 tablet (50 mg total) by mouth at bedtime.   busPIRone 15 MG tablet Commonly known as: BUSPAR Take 1 tablet (15 mg total) by mouth 2 (two) times daily.   calcium carbonate 500 MG chewable tablet Commonly known as: TUMS - dosed in mg elemental calcium Chew 1 tablet by mouth as needed for indigestion or heartburn.   Calcium-Vitamin D 600-200 MG-UNIT Caps Take 1 capsule by mouth daily.   dicyclomine 20 MG tablet Commonly known as: Bentyl Take 1 tablet (20 mg total) by mouth 3  (three) times daily as needed for spasms. What changed:   when to take this  reasons to take this   diphenoxylate-atropine 2.5-0.025 MG tablet Commonly known as: Lomotil Take 1 tablet by mouth 3 times daily as needed for diarrhea for >3-4 stool /day. What changed: additional instructions   fluticasone 50 MCG/ACT nasal spray Commonly known as: FLONASE Place 2 sprays into both nostrils daily.   furosemide 20 MG tablet Commonly known as: LASIX Take 1 tablet (20 mg total) by mouth daily as needed for edema.   gabapentin 300 MG capsule Commonly known as: NEURONTIN Take 2 capsules (600 mg total) by mouth 2 (two) times daily.   HYDROcodone-acetaminophen 7.5-325 MG tablet Commonly known as: Norco Take 1 tablet by mouth every 6 (six) hours as needed for moderate pain.   levothyroxine 200 MCG tablet Commonly known as: SYNTHROID Take 1 tablet (200 mcg total) by mouth daily before breakfast.   Lidocaine (Anorectal) 5 % Gel Apply a pea-sized amount to the affected area 3 times daily.   loratadine 10 MG tablet Commonly known as: CLARITIN Take 1 tablet (10 mg total) by mouth daily.  lovastatin 20 MG tablet Commonly known as: MEVACOR TAKE 1 TABLET BY MOUTH  DAILY AT 6 PM.   magnesium oxide 400 (241.3 Mg) MG tablet Commonly known as: MAG-OX Take 2 tablets (800 mg total) by mouth 2 (two) times daily.   methocarbamol 500 MG tablet Commonly known as: Robaxin Take 1 tablet (500 mg total) by mouth every 8 (eight) hours as needed for muscle spasms.   metoCLOPramide 5 MG tablet Commonly known as: Reglan Take 1 tablet (5 mg total) by mouth every 8 (eight) hours as needed for nausea.   nitroGLYCERIN 0.4 MG SL tablet Commonly known as: NITROSTAT Place 1 tablet (0.4 mg total) under the tongue every 5 (five) minutes as needed.   pantoprazole 40 MG tablet Commonly known as: PROTONIX Take 1 tablet (40 mg total) by mouth daily before breakfast. TAKE 1 TABLET BY MOUTH 30 MINUTES PRIOR TO  BREAKFAST AND SUPPER What changed:   how much to take  how to take this  when to take this   polyethylene glycol 17 g packet Commonly known as: MIRALAX / GLYCOLAX Take 17 g by mouth 2 (two) times daily.   senna-docusate 8.6-50 MG tablet Commonly known as: Senokot-S Take 1 tablet by mouth 2 (two) times daily.   THIAMINE PO Take 1 tablet by mouth daily.   VITAMIN B 12 PO Take 1 tablet by mouth 2 (two) times daily.      Allergies  Allergen Reactions   Aspirin Other (See Comments)    REACTION: Gi Intolerance/ Burning in stomach   Iodinated Diagnostic Agents Itching   Trazodone And Nefazodone     "sick"   Flagyl [Metronidazole] Rash   Iodine Itching    Allergic to IVP dye   Morphine And Related Itching   Discharge Instructions    Diet - low sodium heart healthy   Complete by: As directed    Increase activity slowly   Complete by: As directed      Discharge Exam: Filed Weights   02/08/19 1906 02/12/19 0700 02/14/19 0500  Weight: 96.4 kg 97.9 kg 98.5 kg   Vitals:   02/13/19 2120 02/14/19 0525  BP: 126/82 96/68  Pulse: 80 73  Resp: 18 20  Temp: 97.9 F (36.6 C) 97.8 F (36.6 C)  SpO2: 99% 93%   General: Appear in no distress, no Rash; Oral Mucosa Clear, moist. no Abnormal Mass Or lumps Cardiovascular: S1 and S2 Present, no Murmur, Respiratory: normal respiratory effort, Bilateral Air entry present and Clear to Auscultation, no Crackles, no wheezes Abdomen: Bowel Sound present, Soft and no tenderness, no hernia Extremities: no Pedal edema, no calf tenderness Neurology: alert and oriented to time, place, and person affect appropriate.  The results of significant diagnostics from this hospitalization (including imaging, microbiology, ancillary and laboratory) are listed below for reference.    Significant Diagnostic Studies: Ct Abdomen Pelvis Wo Contrast  Result Date: 02/09/2019 CLINICAL DATA:  Abdominal pain and diarrhea EXAM: CT ABDOMEN AND PELVIS  WITHOUT CONTRAST TECHNIQUE: Multidetector CT imaging of the abdomen and pelvis was performed following the standard protocol without IV contrast. COMPARISON:  04/21/2015 FINDINGS: Lower chest: No acute abnormality. Hepatobiliary: No focal liver abnormality is seen. Status post cholecystectomy. No biliary dilatation. Pancreas: Unremarkable. No pancreatic ductal dilatation or surrounding inflammatory changes. Spleen: Normal in size without focal abnormality. Adrenals/Urinary Tract: Adrenal glands are within normal limits bilaterally. Kidneys demonstrate mild fullness of the renal collecting systems and renal pelves although no ureteral dilatation is seen. Tiny renal stone is  noted in the right lower pole nonobstructive in nature. The bladder is partially distended. Stomach/Bowel: Fecal material is scattered throughout the colon without obstructive change. Mild diverticular changes noted. No diverticulitis is seen. Appendix is not well visualized and may have been surgically removed. Correlation with the patient's clinical history is recommended. No small bowel obstructive changes are seen. Some increased density is noted along the anterior omentum likely related to adhesions and incompletely distended loops of bowel. A small fluid collection is noted in the right lower quadrant decreased in size from the prior exam likely representing postoperative change. Vascular/Lymphatic: Aortic atherosclerosis. No enlarged abdominal or pelvic lymph nodes. Reproductive: Status post hysterectomy. No adnexal masses. Other: No abdominal wall hernia or abnormality. No abdominopelvic ascites. Musculoskeletal: No acute or significant osseous findings. IMPRESSION: Postoperative changes are noted with some thickening of the anterior peritoneum slightly progressed from the prior exam with some unopacified loops of small bowel. No obstructive changes are seen. Scattered fecal material throughout the colon without obstructive change. Mild  fullness of the collecting systems consistent with extrarenal pelves. No true obstructive lesion is noted. Electronically Signed   By: Inez Catalina M.D.   On: 02/09/2019 10:01   Dg Chest 2 View  Result Date: 02/07/2019 CLINICAL DATA:  Pedal edema EXAM: CHEST - 2 VIEW COMPARISON:  07/30/2017 FINDINGS: Cardiac shadows within normal limits. Aortic calcifications are noted. The lungs are clear bilaterally. No acute bony abnormality is seen. IMPRESSION: No acute abnormality noted. Electronically Signed   By: Inez Catalina M.D.   On: 02/07/2019 15:16   Dg Abd Acute 2+v W 1v Chest  Result Date: 02/12/2019 CLINICAL DATA:  Continued nausea and left-sided abdominal pain. EXAM: DG ABDOMEN ACUTE W/ 1V CHEST COMPARISON:  Chest x-ray 02/07/2019 and abdominal film 02/11/2019 FINDINGS: Lungs are adequately inflated without focal airspace consolidation or effusion. Cardiomediastinal silhouette and remainder of the chest is unchanged. Abdominopelvic images demonstrate moderate fecal retention throughout the colon. No dilated small bowel loops. No free peritoneal air. Remainder of the exam is unchanged. IMPRESSION: Moderate fecal retention throughout the colon without evidence of obstruction. No acute cardiopulmonary disease. Electronically Signed   By: Marin Olp M.D.   On: 02/12/2019 13:35   Dg Abd Portable 1v  Result Date: 02/11/2019 CLINICAL DATA:  Intractable nausea and vomiting with abdominal pain 2-3 weeks. EXAM: PORTABLE ABDOMEN - 1 VIEW COMPARISON:  02/10/2019 FINDINGS: Bowel gas pattern is nonobstructive with mild fecal retention throughout the colon. Surgical clips over the lower abdomen/pelvis and right upper quadrant. Remainder the exam is unchanged. IMPRESSION: Nonobstructive bowel gas pattern. Electronically Signed   By: Marin Olp M.D.   On: 02/11/2019 15:26   Dg Abd Portable 1v  Result Date: 02/10/2019 CLINICAL DATA:  Constipation, nausea, and diarrhea, LEFT side and central abdominal pain for 1  week, history breast cancer, GERD, diabetes mellitus, peptic ulcer disease, short gut syndrome, kidney stones EXAM: PORTABLE ABDOMEN - 1 VIEW COMPARISON:  Portable exam 0826 hours compared to CT abdomen and pelvis of 02/09/2019 FINDINGS: Retained contrast in colon to rectum. Nonobstructive bowel gas pattern. No bowel dilatation or bowel wall thickening. Bones demineralized. Numerous surgical clips in the paraspinal regions and upper pelvis. IMPRESSION: Nonobstructive bowel gas pattern. Electronically Signed   By: Lavonia Dana M.D.   On: 02/10/2019 10:53    Microbiology: Recent Results (from the past 240 hour(s))  SARS CORONAVIRUS 2 (TAT 6-24 HRS) Nasopharyngeal Nasopharyngeal Swab     Status: None   Collection Time: 02/08/19  5:46  PM   Specimen: Nasopharyngeal Swab  Result Value Ref Range Status   SARS Coronavirus 2 NEGATIVE NEGATIVE Final    Comment: (NOTE) SARS-CoV-2 target nucleic acids are NOT DETECTED. The SARS-CoV-2 RNA is generally detectable in upper and lower respiratory specimens during the acute phase of infection. Negative results do not preclude SARS-CoV-2 infection, do not rule out co-infections with other pathogens, and should not be used as the sole basis for treatment or other patient management decisions. Negative results must be combined with clinical observations, patient history, and epidemiological information. The expected result is Negative. Fact Sheet for Patients: SugarRoll.be Fact Sheet for Healthcare Providers: https://www.woods-mathews.com/ This test is not yet approved or cleared by the [place] FDA and  has been authorized for detection and/or diagnosis of SARS-CoV-2 by FDA under an Emergency Use Authorization (EUA). This EUA will remain  in effect (meaning this test can be used) for the duration of the COVID-19 declaration under Section 56 4(b)(1) of the Act, 21 U.S.C. section 360bbb-3(b)(1), unless the  authorization is terminated or revoked sooner. Performed at [facility], [place] [address], [place], [place] [address]   Gastrointestinal Panel by PCR , Stool     Status: None   Collection Time: 02/09/19  9:35 AM   Specimen: Rectum; Stool  Result Value Ref Range Status   Campylobacter species NOT DETECTED NOT DETECTED Final   Plesimonas shigelloides NOT DETECTED NOT DETECTED Final   Salmonella species NOT DETECTED NOT DETECTED Final   Yersinia enterocolitica NOT DETECTED NOT DETECTED Final   Vibrio species NOT DETECTED NOT DETECTED Final   Vibrio cholerae NOT DETECTED NOT DETECTED Final   Enteroaggregative E coli (EAEC) NOT DETECTED NOT DETECTED Final   Enteropathogenic E coli (EPEC) NOT DETECTED NOT DETECTED Final   Enterotoxigenic E coli (ETEC) NOT DETECTED NOT DETECTED Final   Shiga like toxin producing E coli (STEC) NOT DETECTED NOT DETECTED Final   Shigella/Enteroinvasive E coli (EIEC) NOT DETECTED NOT DETECTED Final   Cryptosporidium NOT DETECTED NOT DETECTED Final   Cyclospora cayetanensis NOT DETECTED NOT DETECTED Final   Entamoeba histolytica NOT DETECTED NOT DETECTED Final   Giardia lamblia NOT DETECTED NOT DETECTED Final   Adenovirus F40/41 NOT DETECTED NOT DETECTED Final   Astrovirus NOT DETECTED NOT DETECTED Final   Norovirus GI/GII NOT DETECTED NOT DETECTED Final   Rotavirus A NOT DETECTED NOT DETECTED Final   Sapovirus (I, II, IV, and V) NOT DETECTED NOT DETECTED Final    Comment: Performed at [facility], [address] [place], [place] [address]     Labs: CBC: Recent Labs  Lab 02/08/19 1715  02/10/19 0410 02/11/19 0411 02/12/19 0446 02/13/19 0549 02/14/19 0438  WBC 4.5   < > 4.0 3.4* 3.4* 3.5* 3.7*  NEUTROABS 2.5  --   --   --   --   --   --   HGB 10.9*   < > 9.1* 9.6* 9.6* 10.1* 10.0*  HCT 33.8*   < > 29.5* 29.8* 30.7* 31.8* 31.3*  MCV 95.2   < > 96.7 94.0 95.9 96.4 95.1  PLT 243   < > 204 189 211 203 208   < > = values in this  interval not displayed.   Basic Metabolic Panel: Recent Labs  Lab 02/09/19 0413 02/10/19 0410 02/11/19 0411 02/12/19 0446 02/13/19 0549 02/14/19 0438  NA 140 137 137 137 137 136  K 3.6 4.2 4.7 4.7 4.7 4.2  CL 107 106 105 100 100 99  CO2 23 21* 26 24 27 26   GLUCOSE 91 94 103* 86 101* 107*  BUN 14 17 17 19 19 19   CREATININE 0.86 1.07* 1.06* 1.13* 1.13* 1.11*  CALCIUM 7.4* 8.0* 9.1 9.5 9.7 9.6  MG 1.8 1.6* 1.7 1.8 1.8 1.5*  PHOS 3.2  --   --   --   --   --    Liver Function Tests: Recent Labs  Lab 02/08/19 1715 02/09/19 0413  AST 51* 40  ALT 41 36  ALKPHOS 62 53  BILITOT 0.7 0.8  PROT 8.0 6.4*  ALBUMIN 4.2 3.4*   No results for input(s): LIPASE, AMYLASE in the last 168 hours. No results for input(s): AMMONIA in the last 168 hours. Cardiac Enzymes: No results for input(s): CKTOTAL, CKMB, CKMBINDEX, TROPONINI in the last 168 hours. BNP (last 3 results) No results for input(s): BNP in the last 8760 hours. CBG: Recent Labs  Lab 02/10/19 0823 02/11/19 0739 02/12/19 0749 02/13/19 0842 02/14/19 0733  GLUCAP 121* 86 85 81 86    Time spent: 35 minutes  Signed:  Berle Mull  [facility] 02/14/2019 10:52 PM

## 2019-02-14 NOTE — Progress Notes (Signed)
Discharged to home instructions reviewed with pt, acknowledged understanding. SRP, RN 

## 2019-02-17 ENCOUNTER — Other Ambulatory Visit: Payer: Self-pay

## 2019-02-17 NOTE — Patient Outreach (Signed)
Red Emmi - General Discharge  New alert:  Read discharge instructions:  No Filled RX:  No  Can't fill RX:     Placed call to patient and reviewed reason for call. Patient reports that she read her discharge instructions last night, Reports she has not gotten her RX filled because she is waiting to see her primary care MD on 02/20/2019.   Reports she will discuss her medications with her doctor and pick up what is needed after her appointment on 02/20/2019  Reviewed time and date of appointment with patient.  Patient denied any other needs at this time.  [name], RN, BSN, CEN [facility] [employer] [phone]

## 2019-02-20 ENCOUNTER — Other Ambulatory Visit: Payer: Self-pay

## 2019-02-20 ENCOUNTER — Ambulatory Visit (INDEPENDENT_AMBULATORY_CARE_PROVIDER_SITE_OTHER): Payer: Medicare Other | Admitting: Family

## 2019-02-20 ENCOUNTER — Encounter: Payer: Self-pay | Admitting: Family

## 2019-02-20 ENCOUNTER — Other Ambulatory Visit: Payer: Self-pay | Admitting: Family

## 2019-02-20 ENCOUNTER — Other Ambulatory Visit (INDEPENDENT_AMBULATORY_CARE_PROVIDER_SITE_OTHER): Payer: Medicare Other

## 2019-02-20 LAB — BASIC METABOLIC PANEL
BUN: 15 mg/dL (ref 6–23)
CO2: 29 mEq/L (ref 19–32)
Calcium: 9.4 mg/dL (ref 8.4–10.5)
Chloride: 103 mEq/L (ref 96–112)
Creatinine, Ser: 1.01 mg/dL (ref 0.40–1.20)
GFR: 64.68 mL/min (ref >60.00)
Glucose, Bld: 84 mg/dL (ref 70–99)
Potassium: 3.9 mEq/L (ref 3.5–5.1)
Sodium: 139 mEq/L (ref 135–145)

## 2019-02-20 LAB — MAGNESIUM: Magnesium: 1.2 mg/dL — ABNORMAL LOW (ref 1.5–2.5)

## 2019-02-20 MED ORDER — METOCLOPRAMIDE HCL 5 MG PO TABS: 5.0000 mg | ORAL_TABLET | Freq: Three times a day (TID) | ORAL | 0 refills | Status: DC | PRN

## 2019-02-20 NOTE — Patient Outreach (Signed)
Red Emmi: Red emmi alert received today. Same alert. After speaking with patient last week she has a follow up appointment with MD today.   PLAN: will follow up with patient tomorrow after her MD appointments.  [name], RN, BSN, CEN [facility] [employer] [phone]

## 2019-02-20 NOTE — Progress Notes (Signed)
[name] is a 73 y.o. female with the following history as recorded in EpicCare:  Patient Active Problem List   Diagnosis Date Noted  . 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 Dyspnea 02/16/2018  . 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 UTI (urinary tract infection) 11/27/2014  . UTI (lower urinary tract infection) 11/27/2014  . Acute cystitis with hematuria 11/13/2014  . Hemorrhoids, external 11/04/2014  . Wellness examination 10/03/2014  . Pain in joint, shoulder region 09/24/2014  . Cramp in limb 06/26/2014  . Rectal bleeding 05/02/2014  . External hemorrhoids 05/02/2014  . Hemorrhoids without complication [id number]  . Pain in joint, lower leg 02/01/2014  . Vitamin D deficiency 01/16/2014  . Gastroparesis 09/14/2013  . Other dysphagia 09/04/2013  . Nausea alone 09/04/2013  . Iron deficiency anemia 08/28/2013  . Hypomagnesemia 05/11/2013  . Hypokalemia 05/11/2013  . Generalized weakness 05/11/2013  . Dehydration 11/25/2011  . Fatigue 11/16/2011  . Breast cancer ([place]) 09/29/2011  . Family history of breast cancer 08/19/2011  . Primary cancer of lower-inner quadrant of left female breast ([place]) 08/17/2011  . Neck pain 07/29/2011  . Routine general medical examination at a health care facility 06/28/2011  . Edema 11/26/2010  . CLOSTRIDIUM DIFFICILE COLITIS 02/07/2010  . Blind loop syndrome 10/01/2009  . Abdominal pain 10/01/2009  . Vitamin B 12 deficiency 08/30/2009  . 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 Rash and nonspecific skin eruption 08/13/2009  . GOITER, MULTINODULAR 04/02/2009  . CONTACT DERMATITIS&OTHER ECZEMA DUE UNSPEC CAUSE 02/20/2009  . URINARY CALCULUS 10/11/2008  . Asymptomatic menopausal state 10/11/2008  . BACK PAIN, CHRONIC 07/03/2008  . Esophageal reflux 06/12/2008  . Diarrhea 06/12/2008  . ABDOMINAL PAIN-RUQ 05/17/2008  . Diabetes ([name]) 12/10/2006  . Dyslipidemia 12/10/2006  . Gout 12/10/2006  .  HYPERTENSION 12/10/2006  . DIVERTICULOSIS, COLON 12/10/2006  . OSTEOARTHRITIS 12/10/2006  . ESOPHAGEAL STRICTURE 08/23/2002  . HIATAL HERNIA 08/23/2002  . INTERNAL HEMORRHOIDS 02/02/2001    Current Outpatient Medications  Medication Sig Dispense Refill  . allopurinol (ZYLOPRIM) 100 MG tablet Take 1 tablet (100 mg total) by mouth daily. 90 tablet 3  . calcium carbonate (TUMS - DOSED IN MG ELEMENTAL CALCIUM) 500 MG chewable tablet Chew 1 tablet by mouth as needed for indigestion or heartburn.    . Calcium Carbonate-Vitamin D (CALCIUM-VITAMIN D) 600-200 MG-UNIT CAPS Take 1 capsule by mouth daily.      . Cyanocobalamin (VITAMIN B 12 PO) Take 1 tablet by mouth 2 (two) times daily.     Marland Kitchen dicyclomine (BENTYL) 20 MG tablet Take 1 tablet (20 mg total) by mouth 3 (three) times daily as needed for spasms. 360 tablet 3  . diphenoxylate-atropine (LOMOTIL) 2.5-0.025 MG tablet Take 1 tablet by mouth 3 times daily as needed for diarrhea for >3-4 stool /day. 270 tablet 0  . ferrous sulfate 325 (65 FE) MG tablet Take 325 mg by mouth daily with breakfast.    . fluticasone (FLONASE) 50 MCG/ACT nasal spray Place 2 sprays into both nostrils daily. 16 g 6  . levothyroxine (SYNTHROID, LEVOTHROID) 200 MCG tablet Take 1 tablet (200 mcg total) by mouth daily before breakfast. 90 tablet 3  . loratadine (CLARITIN) 10 MG tablet Take 1 tablet (10 mg total) by mouth daily. 30 tablet 11  . lovastatin (MEVACOR) 20 MG tablet TAKE 1 TABLET BY MOUTH  DAILY AT 6 PM. 90 tablet 2  . Magnesium 500 MG CAPS Take 1 mg by mouth 1 day or 1 dose.    Marland Kitchen  methocarbamol (ROBAXIN) 500 MG tablet Take 1 tablet (500 mg total) by mouth every 8 (eight) hours as needed for muscle spasms. 30 tablet 0  . nitroGLYCERIN (NITROSTAT) 0.4 MG SL tablet Place 1 tablet (0.4 mg total) under the tongue every 5 (five) minutes as needed. 25 tablet 6  . pantoprazole (PROTONIX) 40 MG tablet Take 1 tablet (40 mg total) by mouth daily before breakfast. TAKE 1 TABLET BY  MOUTH 30 MINUTES PRIOR TO BREAKFAST AND SUPPER 30 tablet 0  . polyethylene glycol (MIRALAX / GLYCOLAX) 17 g packet Take 17 g by mouth 2 (two) times daily. 30 each 0  . Thiamine HCl (THIAMINE PO) Take 1 tablet by mouth daily.     Marland Kitchen aMILoride (MIDAMOR) 5 MG tablet Take 5 mg by mouth daily.    Marland Kitchen amitriptyline (ELAVIL) 50 MG tablet Take 1 tablet (50 mg total) by mouth at bedtime. 90 tablet 0  . busPIRone (BUSPAR) 15 MG tablet Take 1 tablet (15 mg total) by mouth 2 (two) times daily. (Patient not taking: Reported on 02/07/2019) 90 tablet 3  . famotidine (PEPCID) 40 MG tablet Take 40 mg by mouth daily.    . furosemide (LASIX) 20 MG tablet Take 1 tablet (20 mg total) by mouth daily as needed for edema. (Patient not taking: Reported on 02/20/2019) 30 tablet 1  . gabapentin (NEURONTIN) 300 MG capsule Take 2 capsules (600 mg total) by mouth 2 (two) times daily. (Patient not taking: Reported on 02/07/2019) 360 capsule 3  . HYDROcodone-acetaminophen (NORCO) 7.5-325 MG tablet Take 1 tablet by mouth every 6 (six) hours as needed for moderate pain. (Patient not taking: Reported on 02/20/2019) 30 tablet 0  . Lidocaine, Anorectal, 5 % GEL Apply a pea-sized amount to the affected area 3 times daily. (Patient not taking: Reported on 02/20/2019) 30 g 0  . magnesium oxide (MAG-OX) 400 (241.3 Mg) MG tablet Take 2 tablets (800 mg total) by mouth 2 (two) times daily. (Patient not taking: Reported on 02/20/2019) 60 tablet 0  . Magnesium Oxide -Mg Supplement 400 MG CAPS TAKE 1 CAPSULE BY MOUTH TWICE A DAY AS DIRECTED    . metoCLOPramide (REGLAN) 5 MG tablet Take 1 tablet (5 mg total) by mouth every 8 (eight) hours as needed for nausea. (Patient not taking: Reported on 02/20/2019) 15 tablet 0  . senna-docusate (SENOKOT-S) 8.6-50 MG tablet Take 1 tablet by mouth 2 (two) times daily. (Patient not taking: Reported on 02/20/2019) 10 tablet 0   No current facility-administered medications for this visit.    Facility-Administered  Medications Ordered in Other Visits  Medication Dose Route Frequency Provider Last Rate Last Dose  . acetaminophen (TYLENOL) tablet 1,000 mg  1,000 mg Oral Once Amada Kingfisher, MD        Allergies: Aspirin, Iodinated diagnostic agents, Trazodone and nefazodone, Flagyl [metronidazole], Iodine, and Morphine and related  Past Medical History:  Diagnosis Date  . ANEMIA, IRON DEFICIENCY 05/08/2009  . Angina   . ASYMPTOMATIC POSTMENOPAUSAL STATUS 10/11/2008  . Blood transfusion   . Blood transfusion without reported diagnosis   . Breast cancer ([place]) 09/29/11   invasive grade III ductal ca,assoc high grade dcis,ER/PR=neg  . C. difficile colitis   . Diverticulosis of colon (without mention of hemorrhage)   . Esophageal reflux 06/12/2008  . Gastroparesis   . GOITER, MULTINODULAR 04/02/2009  . Gout, unspecified   . H/O hiatal hernia   . History of lower GI bleeding   . History of radiation therapy 02/08/12-03/25/12   left  breast,total 61gy  . Hypokalemia 05/11/2013  . Hypomagnesemia   . HYPOTHYROIDISM, POST-RADIATION 08/13/2009  . Internal hemorrhoids without mention of complication   . Kidney stones    "several"  . Leukopenia   . Migraines   . Obesity   . Osteoarthrosis, unspecified whether generalized or localized, unspecified site   . Other and unspecified hyperlipidemia   . Personal history of chemotherapy 2013  . Personal history of radiation therapy 2013   left  . PONV (postoperative nausea and vomiting)   . PUD (peptic ulcer disease)   . Short bowel syndrome   . Shortness of breath on exertion    "sometimes"  . Stricture and stenosis of esophagus   . Thyrotoxicosis without mention of goiter or other cause, without mention of thyrotoxic crisis or storm   . Type II or unspecified type diabetes mellitus without mention of complication, not stated as uncontrolled    no med in years diet controled  . Unspecified essential hypertension   . UTI (urinary tract infection)   . Varicose  veins   . VITAMIN B12 DEFICIENCY 08/30/2009    Past Surgical History:  Procedure Laterality Date  . ABDOMINAL ADHESION SURGERY  1980's thru 1990's   "several"  . ABDOMINAL HYSTERECTOMY  1970's   with BSO  . BREAST BIOPSY Left 08/13/11   left breast lower inner quadrant  . BREAST BIOPSY Right 1985   Rt exc bx, benign  . BREAST LUMPECTOMY Left 08/2011  . BREAST LUMPECTOMY W/ NEEDLE LOCALIZATION  09/29/11   left  breast=lymph node,excision benign/ ER/PR=neg, her 2 Positive  . BUNIONECTOMY  1970's   bilateral  . CHOLECYSTECTOMY  1990's  . COLON SURGERY     "several surgeries for short bowel syndrome"  . COLONOSCOPY  2012   multiple   . DILATION AND CURETTAGE OF UTERUS    . ESOPHAGOGASTRODUODENOSCOPY  2011   multiple   . EXCISIONAL HEMORRHOIDECTOMY  11/10/2016  . EYE SURGERY     "long time ago"  . FLEXIBLE SIGMOIDOSCOPY  2011   multiple   . KIDNEY STONE SURGERY  1990's   "tried to go up & get it but pushed it further up"  . LITHOTRIPSY     "4 or 5 times"  . MASTECTOMY W/ NODES PARTIAL  09/29/11   left  . PORT-A-CATH REMOVAL Right 12/19/2013   Procedure: MINOR REMOVAL PORT-A-CATH;  Surgeon: Adin Hector, MD;  Location: [place];  Service: General;  Laterality: Right;  . PORTACATH PLACEMENT  09/29/2011   Procedure: INSERTION PORT-A-CATH;  Surgeon: Adin Hector, MD;  Location: [place];  Service: General;  Laterality: N/A;  . Thyroid Ultrasound  12/1994 and 12/1995  . TOTAL KNEE ARTHROPLASTY Right 06/05/2015   Procedure: RIGHT TOTAL KNEE ARTHROPLASTY;  Surgeon: Ninetta Lights, MD;  Location: [place];  Service: Orthopedics;  Laterality: Right;  . VEIN LIGATION AND STRIPPING  1980's   Right leg    Family History  Problem Relation Age of Onset  . Esophageal cancer Son        deceased  . Diabetes Mother   . Heart disease Mother   . Kidney disease Sister   . Diabetes Father   . Hypertension Father   . Kidney disease Brother        x 3  . Colon cancer Paternal  Uncle   . Breast cancer Maternal Aunt   . Breast cancer Paternal Aunt   . Breast cancer Cousin  Pt states she has 15+ cousins w/ Breast CA    Social History   Tobacco Use  . Smoking status: Former Smoker    Packs/day: 1.00    Years: 10.00    Pack years: 10.00    Types: Cigarettes    Quit date: 09/22/1985    Years since quitting: 33.4  . Smokeless tobacco: Never Used  Substance Use Topics  . Alcohol use: No    Comment: 09/29/11 "used to drink socially years ago"    Subjective:  Was admitted last week with critically low magnesium level; our office found her magnesium level to be 0.7; she was admitted from 02/08/2019- 02/14/2019; notes she has numerous questions about her medications that were changed while she was in the hospital.  Has only been taking 500 mg of magnesium since she was discharged- did not feel comfortable with the discharge directions given at time of discharge.     Objective:  Vitals:   02/20/19 1324  BP: 114/72  Pulse: 77  Temp: 98.1 F (36.7 C)  TempSrc: Oral  SpO2: 96%  Weight: 215 lb 6.4 oz (97.7 kg)  Height: 5\' 5"  (1.651 m)    General: Well developed, well nourished, in no acute distress  Skin : Warm and dry.  Head: Normocephalic and atraumatic  Lungs: Respirations unlabored; clear to auscultation bilaterally without wheeze, rales, rhonchi  CVS exam: normal rate and regular rhythm.  Neurologic: Alert and oriented; speech intact; face symmetrical; moves all extremities well; CNII-XII intact without focal deficit  Assessment:  1. Hypomagnesemia     Plan:  Update BMP and magnesium level today; patient has only been taking 500 mg of magnesium since discharge- not the 800 mg bid that was prescribed; will determine how to manage her magnesium level based on today's results. She is also instructed to call her cardiologist to get clarification about whether a replacement for the Amiloride is needed at this time.   No follow-ups on file.  Orders  Placed This Encounter  Procedures  . Basic Metabolic Panel (BMET)    Standing Status:   Future    Standing Expiration Date:   02/20/2020  . Magnesium    Standing Status:   Future    Standing Expiration Date:   02/20/2020    Requested Prescriptions    No prescriptions requested or ordered in this encounter

## 2019-02-20 NOTE — Patient Instructions (Signed)
Please discuss the Midamor (Amiloride) with your cardiologist; do not take at this time; they will tell you if they want it to re-start.

## 2019-02-21 ENCOUNTER — Telehealth: Payer: Self-pay | Admitting: *Deleted

## 2019-02-21 ENCOUNTER — Other Ambulatory Visit: Payer: Self-pay

## 2019-02-21 ENCOUNTER — Telehealth: Payer: Self-pay

## 2019-02-21 NOTE — Telephone Encounter (Signed)
Spoke with patient today. 

## 2019-02-21 NOTE — Patient Outreach (Signed)
Red emmi alert/ [facility] consult:  Placed call to patient to follow up on medications and phone call last week.  Patient reports she is doing well. Reports seeing MD yesterday and her magnesium remains low. Reports her medications were increased and she will go back next week for a recheck of magnesium.  Patient reports to me that she has all her medications and is taking her medications as prescribed. Denies any needs today. Encouraged patient to keep her follow up appointment with OB-GYN and call her oncologist for her questions about her cancer. She voiced understanding.  Will close case as no needs identified.  [name], RN, BSN, CEN [facility] [employer] [phone]

## 2019-02-21 NOTE — Telephone Encounter (Signed)
Received call from pt stating she has been experiencing issues with hypomagneseium.  Pt questioning if her history of cancer could play a part in her abnormal labs.  Per Dr. Lindi Adie, pt not receiving any current chemotherapy treatments or antiestrogens.  Pt to follow up with PCP and continue workup to find the cause of her hypomagneseium.  Pt verbalized understanding.

## 2019-02-24 ENCOUNTER — Telehealth: Payer: Self-pay | Admitting: Family

## 2019-02-24 NOTE — Telephone Encounter (Signed)
Patient called and would like her medication furosemide (LASIX) 20 MG tablet to be refill when its time to [facility], if any questions please call patient back, thanks.

## 2019-02-27 ENCOUNTER — Other Ambulatory Visit (INDEPENDENT_AMBULATORY_CARE_PROVIDER_SITE_OTHER): Payer: Medicare Other

## 2019-02-27 ENCOUNTER — Other Ambulatory Visit: Payer: Self-pay | Admitting: Family

## 2019-02-27 LAB — MAGNESIUM: Magnesium: 1.7 mg/dL (ref 1.5–2.5)

## 2019-03-01 ENCOUNTER — Other Ambulatory Visit: Payer: Self-pay | Admitting: Family

## 2019-03-01 MED ORDER — FUROSEMIDE 20 MG PO TABS: 20.0000 mg | ORAL_TABLET | Freq: Every day | ORAL | 1 refills | Status: DC | PRN

## 2019-03-02 ENCOUNTER — Other Ambulatory Visit: Payer: Self-pay | Admitting: Family

## 2019-03-07 ENCOUNTER — Other Ambulatory Visit: Payer: Self-pay

## 2019-03-07 ENCOUNTER — Encounter: Payer: Self-pay | Admitting: Obstetrics & Gynecology

## 2019-03-07 ENCOUNTER — Ambulatory Visit (INDEPENDENT_AMBULATORY_CARE_PROVIDER_SITE_OTHER): Payer: Medicare Other | Admitting: Obstetrics & Gynecology

## 2019-03-07 VITALS — BP 126/84 | Ht 65.0 in | Wt 213.0 lb

## 2019-03-07 DIAGNOSIS — N898 Other specified noninflammatory disorders of vagina: Secondary | ICD-10-CM | POA: Diagnosis not present

## 2019-03-07 DIAGNOSIS — N950 Postmenopausal bleeding: Secondary | ICD-10-CM

## 2019-03-07 DIAGNOSIS — Z9189 Other specified personal risk factors, not elsewhere classified: Secondary | ICD-10-CM

## 2019-03-07 DIAGNOSIS — Z1151 Encounter for screening for human papillomavirus (HPV): Secondary | ICD-10-CM | POA: Diagnosis not present

## 2019-03-07 DIAGNOSIS — Z1272 Encounter for screening for malignant neoplasm of vagina: Secondary | ICD-10-CM | POA: Diagnosis not present

## 2019-03-07 DIAGNOSIS — Z113 Encounter for screening for infections with a predominantly sexual mode of transmission: Secondary | ICD-10-CM | POA: Diagnosis not present

## 2019-03-07 DIAGNOSIS — R87620 Atypical squamous cells of undetermined significance on cytologic smear of vagina (ASC-US): Secondary | ICD-10-CM | POA: Diagnosis not present

## 2019-03-07 LAB — WET PREP FOR TRICH, YEAST, CLUE

## 2019-03-07 NOTE — Progress Notes (Signed)
[name] 1946/04/14 [id number]   History:    73 y.o. [id number] Widowed.    RP:  PMB x 3 months  HPI:  H/O TAH/BSO in 1970's.  H/O abnormal Paps before Hysterectomy per patient.  H/O Left Breast Ca in Sep 13, 2011.  Had IC about 6 months ago.  No post coital bleeding.  Mild intermittent pink/brown vaginal spotting x 3 months.  No pelvic pain.  Urine and BMs normal.    Past medical history,surgical history, family history and social history were all reviewed and documented in the EPIC chart.  Gynecologic History No LMP recorded. Patient has had a hysterectomy. Contraception: status post hysterectomy Last Pap: Does not remember Last mammogram: 10/2018. Results were: Negative Bone Density: 2013/09/12 Colonoscopy: 2010/09/13  Obstetric History OB History  Gravida Para Term Preterm AB Living  4 3     1 2   SAB TAB Ectopic Multiple Live Births  1            # Outcome Date GA Lbr Len/2nd Weight Sex Delivery Anes PTL Lv  4 SAB           3 Para           2 Para           1 Para             Obstetric Comments  Oldest son passed away in September 13, 2006 from esophageal cancer     ROS: A ROS was performed and pertinent positives and negatives are included in the history.  GENERAL: No fevers or chills. HEENT: No change in vision, no earache, sore throat or sinus congestion. NECK: No pain or stiffness. CARDIOVASCULAR: No chest pain or pressure. No palpitations. PULMONARY: No shortness of breath, cough or wheeze. GASTROINTESTINAL: No abdominal pain, nausea, vomiting or diarrhea, melena or bright red blood per rectum. GENITOURINARY: No urinary frequency, urgency, hesitancy or dysuria. MUSCULOSKELETAL: No joint or muscle pain, no back pain, no recent trauma. DERMATOLOGIC: No rash, no itching, no lesions. ENDOCRINE: No polyuria, polydipsia, no heat or cold intolerance. No recent change in weight. HEMATOLOGICAL: No anemia or easy bruising or bleeding. NEUROLOGIC: No headache, seizures, numbness, tingling or weakness.  PSYCHIATRIC: No depression, no loss of interest in normal activity or change in sleep pattern.     Exam:   BP 126/84    Ht 5\' 5"  (1.651 m)    Wt 213 lb (96.6 kg)    BMI 35.45 kg/m   Body mass index is 35.45 kg/m.  General appearance : Well developed well nourished female. No acute distress  Pelvic: Vulva: Normal             Vagina: No gross lesions, mild erythema at vaginal vault.  Secretions mildly increased.  Wet prep done.  Pap/HPV HR, Gono-chlam done.  Cervix/Uterus absent  Adnexa  Without masses or tenderness  Anus: Normal  Wet prep negative   Assessment/Plan:  73 y.o. female for annual exam   1. Postmenopausal bleeding Postmenopausal bleeding, light spotting vaginally for 3 months intermittently.  Status post TAH/BSO.  Had intercourse 6 months ago without postcoital bleeding.  Per patient, history of abnormal Pap test before hysterectomy.  Pap test with high-risk HPV done on the vaginal vault.  No lesions seen in the vagina or vulva on gynecologic exam today.  2. Vaginal discharge Wet prep negative.  Patient reassured. - WET PREP FOR [place], YEAST, CLUE  3. Special screening for malignant neoplasms, vagina - Pap IG, CT/NG NAA, and  HPV (high risk)  4. Special screening examination for human papillomavirus (HPV) - Pap IG, CT/NG NAA, and HPV (high risk)  5. Screening examination for venereal disease Rule out gonorrhea and chlamydia, pending results. - Pap IG, CT/NG NAA, and HPV (high risk)  Counseling on above issues and coordination of care >50% x 30 minutes  Princess Bruins MD, 11:11 AM 03/07/2019

## 2019-03-09 LAB — PAP IG, CT-NG NAA, HPV HIGH-RISK
C. trachomatis RNA, TMA: NOT DETECTED
HPV DNA High Risk: NOT DETECTED
N. gonorrhoeae RNA, TMA: NOT DETECTED

## 2019-03-10 ENCOUNTER — Encounter: Payer: Self-pay | Admitting: Obstetrics & Gynecology

## 2019-03-10 NOTE — Patient Instructions (Signed)
1. Postmenopausal bleeding Postmenopausal bleeding, light spotting vaginally for 3 months intermittently.  Status post TAH/BSO.  Had intercourse 6 months ago without postcoital bleeding.  Per patient, history of abnormal Pap test before hysterectomy.  Pap test with high-risk HPV done on the vaginal vault.  No lesions seen in the vagina or vulva on gynecologic exam today.  2. Vaginal discharge Wet prep negative.  Patient reassured. - WET PREP FOR [place], YEAST, CLUE  3. Special screening for malignant neoplasms, vagina - Pap IG, CT/NG NAA, and HPV (high risk)  4. Special screening examination for human papillomavirus (HPV) - Pap IG, CT/NG NAA, and HPV (high risk)  5. Screening examination for venereal disease Rule out gonorrhea and chlamydia, pending results. - Pap IG, CT/NG NAA, and HPV (high risk)  [name], it was a pleasure meeting you today!  I will inform you of your results as soon as they are available.

## 2019-03-24 ENCOUNTER — Other Ambulatory Visit: Payer: Self-pay | Admitting: Physician Assistant

## 2019-03-27 ENCOUNTER — Telehealth: Payer: Self-pay | Admitting: Gastroenterology

## 2019-03-27 ENCOUNTER — Telehealth: Payer: Self-pay | Admitting: Family

## 2019-03-27 ENCOUNTER — Other Ambulatory Visit: Payer: Self-pay

## 2019-03-27 MED ORDER — LOVASTATIN 20 MG PO TABS: ORAL_TABLET | ORAL | 2 refills | Status: DC

## 2019-03-27 MED ORDER — FAMOTIDINE 40 MG PO TABS: 40.0000 mg | ORAL_TABLET | Freq: Every day | ORAL | 1 refills | Status: DC

## 2019-03-27 NOTE — Telephone Encounter (Signed)
Pt needs new rx from [facility] lovastatin 20 mg#90 w/refills sent to [facility] by mail last refilled by dr Loanne Drilling

## 2019-03-27 NOTE — Telephone Encounter (Signed)
Prescription sent to patient's mail order pharmacy.  

## 2019-03-27 NOTE — Telephone Encounter (Signed)
Pt requested famotidine refill sent to [facility].

## 2019-04-24 ENCOUNTER — Telehealth: Payer: Self-pay | Admitting: Gastroenterology

## 2019-04-24 NOTE — Telephone Encounter (Signed)
Pt returning your call

## 2019-04-24 NOTE — Telephone Encounter (Signed)
Left message for patient to call back  

## 2019-04-26 NOTE — Telephone Encounter (Signed)
Left message for patient to call back  

## 2019-04-26 NOTE — Telephone Encounter (Signed)
Patient reports GERD.  Taking pantoprazole QD.  She will increase to BID and come see Carl Best on 05/01/19 2:00

## 2019-04-29 NOTE — Progress Notes (Signed)
04/29/2019 [name] [id number] Mar 21, 1946   History of Present Illness: [name] is a 73 year old female with a past medical history of mild CAD, IDA, breast cancer 2013 chemo, radiation and left lumpectomy, DM II, hypothyroidism following radioiodine therapy, vitamin B 12 deficiency, short gut syndrome, small bowel resection secondary to SBO requiring several small bowel resections, GERD, gastroparesis, C. diff colitis. Past cholecystectomy. She is on Pantoprazole 40mg  in the am and Famotidine 40mg  at bed time. She complains of having acid reflux at night time with throat soreness despite sleeping with the head of the bed elevated. She eats dinner at 4pm or 5pm. She goes to bed at 11pm. No NSAID use. No alcohol use. She complains of having dysphagis for the past 2 weeks. She describes episodes of  food getting stuck to the mid esophagus for the past 2 weeks which occurs daily. She drinks water and the food passes down the esophagus. She underwent an EGD 09/05/2013 which showed a hiatal hernia and esophagitis. She is passing 1 to 4 solid firm brown BMs daily.  No rectal bleeding or black sools. She stated her previous chronic diarrhea has mostly resolved. She previously took Xifaxan, Lomotil and Cholestyramine due to having chronic diarrhea. She underwent a colonoscopy 09/2010 which was normal. She was admitted to the hospital 02/08/2019 with diarrhea and hypomagnesemia ( Mg++ 0.7). Repeat Mg++ 1.7 on 9/28. Significant family history of breast cancer. Paternal uncle was diagnosed with colon cancer in his mid 110's. Her son died from esophageal cancer. She denies alcohol or tobacco use.     EGD 09/05/2013 by [name]: Esophagitis Hiatal hernia   Colonoscopy 09/2010 by Dr.Patterson:  Surgical [P], random colon, biopsies BENIGN COLONIC MUCOSA. NO SIGNIFICANT INFLAMMATION OR OTHER ABNORMALITIES IDENTIFIED  Past Medical History:  Diagnosis Date  . ANEMIA, IRON DEFICIENCY  05/08/2009  . Angina   . ASYMPTOMATIC POSTMENOPAUSAL STATUS 10/11/2008  . Blood transfusion   . Blood transfusion without reported diagnosis   . Breast cancer ([place]) 09/29/11   invasive grade III ductal ca,assoc high grade dcis,ER/PR=neg  . C. difficile colitis   . Diverticulosis of colon (without mention of hemorrhage)   . Esophageal reflux 06/12/2008  . Gastroparesis   . GOITER, MULTINODULAR 04/02/2009  . Gout, unspecified   . H/O hiatal hernia   . History of lower GI bleeding   . History of radiation therapy 02/08/12-03/25/12   left breast,total 61gy  . Hypokalemia 05/11/2013  . Hypomagnesemia   . HYPOTHYROIDISM, POST-RADIATION 08/13/2009  . Internal hemorrhoids without mention of complication   . Kidney stones    "several"  . Leukopenia   . Migraines   . Obesity   . Osteoarthrosis, unspecified whether generalized or localized, unspecified site   . Other and unspecified hyperlipidemia   . Personal history of chemotherapy 2013  . Personal history of radiation therapy 2013   left  . PONV (postoperative nausea and vomiting)   . PUD (peptic ulcer disease)   . Short bowel syndrome   . Shortness of breath on exertion    "sometimes"  . Stricture and stenosis of esophagus   . Thyrotoxicosis without mention of goiter or other cause, without mention of thyrotoxic crisis or storm   . Type II or unspecified type diabetes mellitus without mention of complication, not stated as uncontrolled    no med in years diet controled  . Unspecified essential hypertension   . UTI (urinary tract infection)   . Varicose veins   .  VITAMIN B12 DEFICIENCY 08/30/2009   Past Surgical History:  Procedure Laterality Date  . ABDOMINAL ADHESION SURGERY  1980's thru 1990's   "several"  . ABDOMINAL HYSTERECTOMY  1970's   with BSO  . BREAST BIOPSY Left 08/13/11   left breast lower inner quadrant  . BREAST BIOPSY Right 1985   Rt exc bx, benign  . BREAST LUMPECTOMY Left 08/2011  . BREAST LUMPECTOMY W/ NEEDLE  LOCALIZATION  09/29/11   left  breast=lymph node,excision benign/ ER/PR=neg, her 2 Positive  . BUNIONECTOMY  1970's   bilateral  . CHOLECYSTECTOMY  1990's  . COLON SURGERY     "several surgeries for short bowel syndrome"  . COLONOSCOPY  2012   multiple   . DILATION AND CURETTAGE OF UTERUS    . ESOPHAGOGASTRODUODENOSCOPY  2011   multiple   . EXCISIONAL HEMORRHOIDECTOMY  11/10/2016  . EYE SURGERY     "long time ago"  . FLEXIBLE SIGMOIDOSCOPY  2011   multiple   . KIDNEY STONE SURGERY  1990's   "tried to go up & get it but pushed it further up"  . LITHOTRIPSY     "4 or 5 times"  . MASTECTOMY W/ NODES PARTIAL  09/29/11   left  . PORT-A-CATH REMOVAL Right 12/19/2013   Procedure: MINOR REMOVAL PORT-A-CATH;  Surgeon: Adin Hector, MD;  Location: [facility];  Service: General;  Laterality: Right;  . PORTACATH PLACEMENT  09/29/2011   Procedure: INSERTION PORT-A-CATH;  Surgeon: Adin Hector, MD;  Location: [facility];  Service: General;  Laterality: N/A;  . Thyroid Ultrasound  12/1994 and 12/1995  . TOTAL KNEE ARTHROPLASTY Right 06/05/2015   Procedure: RIGHT TOTAL KNEE ARTHROPLASTY;  Surgeon: Ninetta Lights, MD;  Location: [facility];  Service: Orthopedics;  Laterality: Right;  . VEIN LIGATION AND STRIPPING  1980's   Right leg   Current Outpatient Medications on File Prior to Visit  Medication Sig Dispense Refill  . allopurinol (ZYLOPRIM) 100 MG tablet Take 1 tablet (100 mg total) by mouth daily. 90 tablet 3  . amitriptyline (ELAVIL) 50 MG tablet Take 50 mg by mouth at bedtime.    . calcium carbonate (TUMS - DOSED IN MG ELEMENTAL CALCIUM) 500 MG chewable tablet Chew 1 tablet by mouth as needed for indigestion or heartburn.    . Calcium Carbonate-Vitamin D (CALCIUM-VITAMIN D) 600-200 MG-UNIT CAPS Take 1 capsule by mouth daily.      . Cyanocobalamin (VITAMIN B 12 PO) Take 1 tablet by mouth 2 (two) times daily.     Marland Kitchen dicyclomine (BENTYL) 20 MG tablet Take 1 tablet (20 mg total) by  mouth 3 (three) times daily as needed for spasms. (Patient taking differently: Take 20 mg by mouth as needed for spasms. ) 360 tablet 3  . diphenoxylate-atropine (LOMOTIL) 2.5-0.025 MG tablet Take 1 tablet by mouth 3 times daily as needed for diarrhea for >3-4 stool /day. 270 tablet 0  . famotidine (PEPCID) 40 MG tablet Take 1 tablet (40 mg total) by mouth at bedtime. 90 tablet 1  . ferrous sulfate 325 (65 FE) MG tablet Take 325 mg by mouth daily with breakfast.    . fluticasone (FLONASE) 50 MCG/ACT nasal spray Place 2 sprays into both nostrils daily. (Patient taking differently: Place 2 sprays into both nostrils daily as needed. ) 16 g 6  . furosemide (LASIX) 20 MG tablet Take 1 tablet (20 mg total) by mouth daily as needed for edema. 90 tablet 1  . levothyroxine (SYNTHROID, LEVOTHROID) 200  MCG tablet Take 1 tablet (200 mcg total) by mouth daily before breakfast. 90 tablet 3  . loratadine (CLARITIN) 10 MG tablet Take 1 tablet (10 mg total) by mouth daily. 30 tablet 11  . lovastatin (MEVACOR) 20 MG tablet TAKE 1 TABLET BY MOUTH  DAILY AT 6 PM. 90 tablet 2  . Magnesium 500 MG CAPS Take 1 mg by mouth 2 (two) times daily.     . methocarbamol (ROBAXIN) 500 MG tablet Take 1 tablet (500 mg total) by mouth every 8 (eight) hours as needed for muscle spasms. 30 tablet 0  . metoCLOPramide (REGLAN) 5 MG tablet Take 1 tablet (5 mg total) by mouth every 8 (eight) hours as needed for nausea. 15 tablet 0  . nitroGLYCERIN (NITROSTAT) 0.4 MG SL tablet Place 1 tablet (0.4 mg total) under the tongue every 5 (five) minutes as needed. 25 tablet 6  . pantoprazole (PROTONIX) 40 MG tablet Take 1 tablet (40 mg total) by mouth daily before breakfast. TAKE 1 TABLET BY MOUTH 30 MINUTES PRIOR TO BREAKFAST AND SUPPER 30 tablet 0  . polyethylene glycol (MIRALAX / GLYCOLAX) 17 g packet Take 17 g by mouth 2 (two) times daily. 30 each 0  . Thiamine HCl (THIAMINE PO) Take 1 tablet by mouth daily.      Current Facility-Administered  Medications on File Prior to Visit  Medication Dose Route Frequency Provider Last Rate Last Dose  . acetaminophen (TYLENOL) tablet 1,000 mg  1,000 mg Oral Once Amada Kingfisher, MD       Allergies  Allergen Reactions  . Aspirin Other (See Comments)    REACTION: Gi Intolerance/ Burning in stomach  . Iodinated Diagnostic Agents Itching  . Trazodone And Nefazodone     "sick"  . Flagyl [Metronidazole] Rash  . Iodine Itching    Allergic to IVP dye  . Morphine And Related Itching    Family History  Problem Relation Age of Onset  . Esophageal cancer Son        deceased  . Breast cancer Son        esophageal  . Diabetes Mother   . Heart disease Mother   . Kidney disease Sister   . Diabetes Father   . Hypertension Father   . Kidney disease Brother        x 3  . Colon cancer Paternal Uncle   . Breast cancer Maternal Aunt   . Breast cancer Paternal Aunt   . Breast cancer Cousin        Pt states she has 15+ cousins w/ Breast CA    Current Medications, Allergies, Past Medical History, Past Surgical History, Family History and Social History were reviewed in [employer] record.   Physical Exam: BP 110/78   Pulse 100   Temp 98.3 F (36.8 C)   Ht 5\' 5"  (1.651 m)   Wt 218 lb 2 oz (98.9 kg)   BMI 36.30 kg/m   209lbs.  General: Well developed  73 year old female in no acute distress Head: Normocephalic and atraumatic Eyes:  sclerae anicteric, conjunctiva pink  Ears: Normal auditory acuity Mouth: Upper dentures and lower partial intact.  Lungs: Clear throughout to auscultation Heart: Regular rate and rhythm Abdomen: Soft, nondistended, mild LLQ tenderness and non distended. No masses, no hepatomegaly. Normal bowel sounds x 4 quads. Numerous abdominal scars.  Rectal: Deferred. Musculoskeletal: Symmetrical with no gross deformities  Extremities: No edema  Neurological: Alert oriented x 4, grossly nonfocal Psychological:  Alert and  cooperative. Normal mood  and affect  Assessment and Recommendations:  31. 73 year old female with chronic diarrhea, ? short bowel syndrome currently resolved  -Patient will call office if diarrhea recurs.  2. GERD and dysphagia  -Stop Pantoprazole which may be attributing to her hypomagnesemia which required hospital admission 02/08/2019 with Magnesium level critically low at 0.7. -Patient to call our office if she develops reflux symptoms off of Pantoprazole -EGD benefits and risk discussed including risk with sedation, risk of bleeding, perforation and infection  -Further management to be determined after EGD completed    3. Hypomagnesemia possible due to Pantoprazole -repeat BMP, Mg++ -Stop Pantoprazole -Famotidine 40mg  after dinner, Carafate slurry 1gm po at bed time  -Continue Mg++ supplementation as prescribed by PCP  3. Persona history of breast cancer   4. Family history of colon cancer (Paternal Uncle) -Patient elects to proceed with a colonoscopy at the time of her EGD if her electrolytes and mg++ levels are normal, if not, colonoscopy will be scheduled at a later date. Benefits and risks discussed.   5. Normocytic anemia. Hg 10.1. HCT 31.8. MCV 94.4.  -EGD and colonoscopy    6. Renal insufficiency. BUN 19. Cr. 1.13.   7.  Constipation per CT of the abdomen and pelvis and x-ray 01/2019, resolved   8. Mild CAD, stable

## 2019-05-01 ENCOUNTER — Ambulatory Visit (INDEPENDENT_AMBULATORY_CARE_PROVIDER_SITE_OTHER): Payer: Medicare Other | Admitting: Nurse Practitioner

## 2019-05-01 ENCOUNTER — Other Ambulatory Visit (INDEPENDENT_AMBULATORY_CARE_PROVIDER_SITE_OTHER): Payer: Medicare Other

## 2019-05-01 ENCOUNTER — Encounter: Payer: Self-pay | Admitting: Nurse Practitioner

## 2019-05-01 VITALS — BP 110/78 | HR 100 | Temp 98.3°F | Ht 65.0 in | Wt 218.1 lb

## 2019-05-01 DIAGNOSIS — N189 Chronic kidney disease, unspecified: Secondary | ICD-10-CM | POA: Diagnosis not present

## 2019-05-01 DIAGNOSIS — D631 Anemia in chronic kidney disease: Secondary | ICD-10-CM | POA: Diagnosis not present

## 2019-05-01 DIAGNOSIS — K219 Gastro-esophageal reflux disease without esophagitis: Secondary | ICD-10-CM | POA: Diagnosis not present

## 2019-05-01 DIAGNOSIS — R1310 Dysphagia, unspecified: Secondary | ICD-10-CM

## 2019-05-01 DIAGNOSIS — Z1159 Encounter for screening for other viral diseases: Secondary | ICD-10-CM | POA: Diagnosis not present

## 2019-05-01 LAB — BASIC METABOLIC PANEL
BUN: 21 mg/dL (ref 6–23)
CO2: 30 mEq/L (ref 19–32)
Calcium: 9.8 mg/dL (ref 8.4–10.5)
Chloride: 101 mEq/L (ref 96–112)
Creatinine, Ser: 1.16 mg/dL (ref 0.40–1.20)
GFR: 55.1 mL/min — ABNORMAL LOW (ref >60.00)
Glucose, Bld: 87 mg/dL (ref 70–99)
Potassium: 3.9 mEq/L (ref 3.5–5.1)
Sodium: 139 mEq/L (ref 135–145)

## 2019-05-01 LAB — CBC
HCT: 32.6 % — ABNORMAL LOW (ref 36.0–46.0)
Hemoglobin: 10.9 g/dL — ABNORMAL LOW (ref 12.0–15.0)
MCHC: 33.3 g/dL (ref 30.0–36.0)
MCV: 90.6 fl (ref 78.0–100.0)
Platelets: 243 10*3/uL (ref 150.0–400.0)
RBC: 3.59 Mil/uL — ABNORMAL LOW (ref 3.87–5.11)
RDW: 13.2 % (ref 11.5–15.5)
WBC: 4.8 10*3/uL (ref 4.0–10.5)

## 2019-05-01 LAB — MAGNESIUM: Magnesium: 1.6 mg/dL (ref 1.5–2.5)

## 2019-05-01 MED ORDER — SUCRALFATE 1 G PO TABS: ORAL_TABLET | ORAL | 0 refills | Status: DC

## 2019-05-01 MED ORDER — NA SULFATE-K SULFATE-MG SULF 17.5-3.13-1.6 GM/177ML PO SOLN: 1.0000 | Freq: Once | ORAL | 0 refills | Status: AC

## 2019-05-01 NOTE — Patient Instructions (Addendum)
If you are age 75 or older, your body mass index should be between 23-30. Your Body mass index is 36.3 kg/m. If this is out of the aforementioned range listed, please consider follow up with your Primary Care Provider.  If you are age 15 or younger, your body mass index should be between 19-25. Your Body mass index is 36.3 kg/m. If this is out of the aformentioned range listed, please consider follow up with your Primary Care Provider.   We have sent the following medications to your pharmacy for you to pick up at your convenience:  Suprep  Carafate  Please take your famotadine 40mg  after dinner.  Your provider has requested that you go to the basement level for lab work before leaving today. Press "B" on the elevator. The lab is located at the first door on the left as you exit the elevator.   You have been scheduled for an endoscopy and colonoscopy. Please follow the written instructions given to you at your visit today. Please pick up your prep supplies at the pharmacy within the next 1-3 days. If you use inhalers (even only as needed), please bring them with you on the day of your procedure.

## 2019-05-02 ENCOUNTER — Telehealth: Payer: Self-pay | Admitting: Nurse Practitioner

## 2019-05-02 ENCOUNTER — Other Ambulatory Visit: Payer: Self-pay | Admitting: General Surgery

## 2019-05-02 MED ORDER — NA SULFATE-K SULFATE-MG SULF 17.5-3.13-1.6 GM/177ML PO SOLN: 1.0000 | Freq: Once | ORAL | 0 refills | Status: AC

## 2019-05-02 NOTE — Telephone Encounter (Signed)
Pt is requesting suprep sent to [facility] on [address].

## 2019-05-02 NOTE — Progress Notes (Signed)
Reviewed and agree with management plan.  Joachim Carton T. Maegan Buller, MD FACG [facility]  

## 2019-05-10 ENCOUNTER — Ambulatory Visit (INDEPENDENT_AMBULATORY_CARE_PROVIDER_SITE_OTHER): Payer: Medicare Other

## 2019-05-10 ENCOUNTER — Other Ambulatory Visit: Payer: Self-pay | Admitting: Gastroenterology

## 2019-05-10 DIAGNOSIS — Z1159 Encounter for screening for other viral diseases: Secondary | ICD-10-CM

## 2019-05-11 LAB — SARS CORONAVIRUS 2 (TAT 6-24 HRS): SARS Coronavirus 2: NEGATIVE

## 2019-05-12 ENCOUNTER — Encounter: Payer: Self-pay | Admitting: Gastroenterology

## 2019-05-12 ENCOUNTER — Ambulatory Visit (AMBULATORY_SURGERY_CENTER): Payer: Medicare Other | Admitting: Gastroenterology

## 2019-05-12 ENCOUNTER — Other Ambulatory Visit: Payer: Self-pay

## 2019-05-12 VITALS — BP 92/62 | HR 79 | Temp 98.1°F | Resp 18 | Ht 65.0 in | Wt 218.0 lb

## 2019-05-12 DIAGNOSIS — K222 Esophageal obstruction: Secondary | ICD-10-CM | POA: Diagnosis not present

## 2019-05-12 DIAGNOSIS — R1310 Dysphagia, unspecified: Secondary | ICD-10-CM | POA: Diagnosis not present

## 2019-05-12 DIAGNOSIS — K449 Diaphragmatic hernia without obstruction or gangrene: Secondary | ICD-10-CM

## 2019-05-12 DIAGNOSIS — D120 Benign neoplasm of cecum: Secondary | ICD-10-CM | POA: Diagnosis not present

## 2019-05-12 DIAGNOSIS — K648 Other hemorrhoids: Secondary | ICD-10-CM | POA: Diagnosis not present

## 2019-05-12 DIAGNOSIS — K5730 Diverticulosis of large intestine without perforation or abscess without bleeding: Secondary | ICD-10-CM | POA: Diagnosis not present

## 2019-05-12 DIAGNOSIS — D649 Anemia, unspecified: Secondary | ICD-10-CM | POA: Diagnosis not present

## 2019-05-12 DIAGNOSIS — K219 Gastro-esophageal reflux disease without esophagitis: Secondary | ICD-10-CM | POA: Diagnosis not present

## 2019-05-12 DIAGNOSIS — L539 Erythematous condition, unspecified: Secondary | ICD-10-CM

## 2019-05-12 DIAGNOSIS — D508 Other iron deficiency anemias: Secondary | ICD-10-CM

## 2019-05-12 DIAGNOSIS — I2510 Atherosclerotic heart disease of native coronary artery without angina pectoris: Secondary | ICD-10-CM | POA: Diagnosis not present

## 2019-05-12 MED ORDER — SODIUM CHLORIDE 0.9 % IV SOLN: 500.0000 mL | Freq: Once | INTRAVENOUS | Status: DC

## 2019-05-12 MED ORDER — FAMOTIDINE 40 MG PO TABS: 40.0000 mg | ORAL_TABLET | Freq: Every day | ORAL | 3 refills | Status: DC

## 2019-05-12 NOTE — Progress Notes (Signed)
Report to PACU, RN, vss, BBS= Clear.  

## 2019-05-12 NOTE — Patient Instructions (Signed)
YOU HAD AN ENDOSCOPIC PROCEDURE TODAY AT THE [facility]:   Refer to the procedure report that was given to you for any specific questions about what was found during the examination.  If the procedure report does not answer your questions, please call your gastroenterologist to clarify.  If you requested that your care partner not be given the details of your procedure findings, then the procedure report has been included in a sealed envelope for you to review at your convenience later.  YOU SHOULD EXPECT: Some feelings of bloating in the abdomen. Passage of more gas than usual.  Walking can help get rid of the air that was put into your GI tract during the procedure and reduce the bloating. If you had a lower endoscopy (such as a colonoscopy or flexible sigmoidoscopy) you may notice spotting of blood in your stool or on the toilet paper. If you underwent a bowel prep for your procedure, you may not have a normal bowel movement for a few days.  Please Note:  You might notice some irritation and congestion in your nose or some drainage.  This is from the oxygen used during your procedure.  There is no need for concern and it should clear up in a day or so.  SYMPTOMS TO REPORT IMMEDIATELY:   Following lower endoscopy (colonoscopy or flexible sigmoidoscopy):  Excessive amounts of blood in the stool  Significant tenderness or worsening of abdominal pains  Swelling of the abdomen that is new, acute  Fever of 100F or higher   Following upper endoscopy (EGD)  Vomiting of blood or coffee ground material  New chest pain or pain under the shoulder blades  Painful or persistently difficult swallowing  New shortness of breath  Fever of 100F or higher  Black, tarry-looking stools  For urgent or emergent issues, a gastroenterologist can be reached at any hour by calling [phone].   DIET:  We do recommend a small meal at first, but then you may proceed to your regular diet.  Drink  plenty of fluids but you should avoid alcoholic beverages for 24 hours.  ACTIVITY:  You should plan to take it easy for the rest of today and you should NOT DRIVE or use heavy machinery until tomorrow (because of the sedation medicines used during the test).    FOLLOW UP: Our staff will call the number listed on your records 48-72 hours following your procedure to check on you and address any questions or concerns that you may have regarding the information given to you following your procedure. If we do not reach you, we will leave a message.  We will attempt to reach you two times.  During this call, we will ask if you have developed any symptoms of COVID 19. If you develop any symptoms (ie: fever, flu-like symptoms, shortness of breath, cough etc.) before then, please call [phone].  If you test positive for Covid 19 in the 2 weeks post procedure, please call and report this information to us.    If any biopsies were taken you will be contacted by phone or by letter within the next 1-3 weeks.  Please call us at [phone] if you have not heard about the biopsies in 3 weeks.    SIGNATURES/CONFIDENTIALITY: You and/or your care partner have signed paperwork which will be entered into your electronic medical record.  These signatures attest to the fact that that the information above on your After Visit Summary has been reviewed and is   understood.  Full responsibility of the confidentiality of this discharge information lies with you and/or your care-partner. 

## 2019-05-12 NOTE — Progress Notes (Signed)
Temp JR  Pt's states no medical or surgical changes since previsit or office visit.  VS LC

## 2019-05-12 NOTE — Op Note (Signed)
[facility] Patient Name: [name] Procedure Date: 05/12/2019 2:17 PM MRN: [id number] Endoscopist: Ladene Artist , MD Age: 73 Referring MD:  Date of Birth: 1945/09/11 Gender: Female Account #: [id number] Procedure:                Colonoscopy Indications:              Iron deficiency anemia Medicines:                Monitored Anesthesia Care Procedure:                Pre-Anesthesia Assessment:                           - Prior to the procedure, a History and Physical                            was performed, and patient medications and                            allergies were reviewed. The patient's tolerance of                            previous anesthesia was also reviewed. The risks                            and benefits of the procedure and the sedation                            options and risks were discussed with the patient.                            All questions were answered, and informed consent                            was obtained. Prior Anticoagulants: The patient has                            taken no previous anticoagulant or antiplatelet                            agents. ASA Grade Assessment: II - A patient with                            mild systemic disease. After reviewing the risks                            and benefits, the patient was deemed in                            satisfactory condition to undergo the procedure.                           After obtaining informed consent, the colonoscope  was passed under direct vision. Throughout the                            procedure, the patient's blood pressure, pulse, and                            oxygen saturations were monitored continuously. The                            Colonoscope was introduced through the anus and                            advanced to the the cecum, identified by                            appendiceal orifice and ileocecal valve.  The                            ileocecal valve, appendiceal orifice, and rectum                            were photographed. The quality of the bowel                            preparation was fair after extensive lavage and                            suctioning. The patient tolerated the procedure                            well. The colonoscopy was somewhat difficult due to                            inadequate bowel prep, restricted mobility of the                            sigmoid colon and a tortuous colon. Scope In: 2:20:59 PM Scope Out: 2:46:20 PM Scope Withdrawal Time: 0 hours 20 minutes 20 seconds  Total Procedure Duration: 0 hours 25 minutes 21 seconds  Findings:                 The perianal and digital rectal examinations were                            normal.                           A 6 mm polyp was found in the cecum. The polyp was                            sessile. The polyp was removed with a cold snare.                            Resection and retrieval were complete.  Localized mild mucosal changes characterized by                            erythema, friability and granularity were found                            around the appendiceal orifice. Biopsies were taken                            with a cold forceps for histology.                           Scattered small-mouthed diverticula were found in                            the right colon. There was no evidence of                            diverticular bleeding.                           Multiple small-mouthed diverticula were found in                            the left colon. There was no evidence of                            diverticular bleeding.                           Internal hemorrhoids were found during                            retroflexion. The hemorrhoids were medium-sized and                            Grade I (internal hemorrhoids that do not prolapse).                            The exam was otherwise without abnormality on                            direct and retroflexion views. Complications:            No immediate complications. Estimated blood loss:                            None. Estimated Blood Loss:     Estimated blood loss: none. Impression:               - Preparation of the colon was fair.                           - One 6 mm polyp in the cecum, removed with a cold  snare. Resected and retrieved.                           - Localized mild mucosal changes were found around                            the appendiceal orifice secondary to colitis.                            Biopsied.                           - Mild diverticulosis in the right colon.                           - Mild diverticulosis in the left colon.                           - Internal hemorrhoids.                           - The examination was otherwise normal on direct                            and retroflexion views. Recommendation:           - Patient has a contact number available for                            emergencies. The signs and symptoms of potential                            delayed complications were discussed with the                            patient. Return to normal activities tomorrow.                            Written discharge instructions were provided to the                            patient.                           - Resume previous diet.                           - Continue present medications.                           - Consider repeat colonoscopy with a 2 day bowel                            prep pending pathology review and discussion with                            patient.                           -  Await pathology results. Ladene Artist, MD 05/12/2019 3:00:44 PM This report has been signed electronically.

## 2019-05-12 NOTE — Op Note (Addendum)
[place] Patient Name: [name] Procedure Date: 05/12/2019 2:15 PM MRN: [id number] Endoscopist: Ladene Artist , MD Age: 73 Referring MD:  Date of Birth: 18-Jan-1946 Gender: Female Account #: [id number] Procedure:                Upper GI endoscopy Indications:              Dysphagia, Gastroesophageal reflux disease Medicines:                Monitored Anesthesia Care Procedure:                Pre-Anesthesia Assessment:                           - Prior to the procedure, a History and Physical                            was performed, and patient medications and                            allergies were reviewed. The patient's tolerance of                            previous anesthesia was also reviewed. The risks                            and benefits of the procedure and the sedation                            options and risks were discussed with the patient.                            All questions were answered, and informed consent                            was obtained. Prior Anticoagulants: The patient has                            taken no previous anticoagulant or antiplatelet                            agents. ASA Grade Assessment: II - A patient with                            mild systemic disease. After reviewing the risks                            and benefits, the patient was deemed in                            satisfactory condition to undergo the procedure.                           After obtaining informed consent, the endoscope was  passed under direct vision. Throughout the                            procedure, the patient's blood pressure, pulse, and                            oxygen saturations were monitored continuously. The                            Endoscope was introduced through the mouth, and                            advanced to the second part of duodenum. The upper                            GI  endoscopy was accomplished without difficulty.                            The patient tolerated the procedure well. Scope In: Scope Out: Findings:                 Two benign-appearing, intrinsic mild stenoses were                            found in the distal esophagus. The narrowest                            stenosis measured 1.3 cm (inner diameter) x less                            than one cm (in length). The stenoses were                            traversed. A guidewire was placed and the scope was                            withdrawn. Dilations were performed with Savary                            dilators with mild resistance at 14 mm, 15 mm and                            16 mm. No heme noted.                           The exam of the esophagus was otherwise normal.                           A medium-sized hiatal hernia was present.                           The exam of the stomach was otherwise normal.  The duodenal bulb and second portion of the                            duodenum were normal. Complications:            No immediate complications. Estimated Blood Loss:     Estimated blood loss: none. Impression:               - Benign-appearing esophageal stenoses. Dilated.                           - Medium-sized hiatal hernia.                           - Normal duodenal bulb and second portion of the                            duodenum.                           - No specimens collected. Recommendation:           - Patient has a contact number available for                            emergencies. The signs and symptoms of potential                            delayed complications were discussed with the                            patient. Return to normal activities tomorrow.                            Written discharge instructions were provided to the                            patient.                           - Clear liquid diet for 2 hours,  then advance as                            tolerated to soft diet today.                           - Resume prior diet tomorrow.                           - Antireflux measures long term.                           - Continue present medications.                           - Change famotidine to 40 mg po bid, 1 year of  refills. Ladene Artist, MD 05/12/2019 3:07:37 PM This report has been signed electronically.

## 2019-05-16 ENCOUNTER — Telehealth: Payer: Self-pay

## 2019-05-16 NOTE — Telephone Encounter (Signed)
  Follow up Call-  Call back number 05/12/2019 05/12/2019  Post procedure Call Back phone  # [phone] -  Permission to leave phone message Yes Yes  Some recent data might be hidden     Patient questions:  Do you have a fever, pain , or abdominal swelling? No. Pain Score  0 *  Have you tolerated food without any problems? Yes.    Have you been able to return to your normal activities? Yes.    Do you have any questions about your discharge instructions: Diet   No. Medications  No. Follow up visit  No.  Do you have questions or concerns about your Care? No.  Actions: * If pain score is 4 or above: No action needed, pain <4. 1. Have you developed a fever since your procedure? no  2.   Have you had an respiratory symptoms (SOB or cough) since your procedure? no  3.   Have you tested positive for COVID 19 since your procedure no  4.   Have you had any family members/close contacts diagnosed with the COVID 19 since your procedure?  no   If yes to any of these questions please route to Joylene John, RN and Alphonsa Gin, Therapist, sports.

## 2019-05-18 ENCOUNTER — Encounter: Payer: Self-pay | Admitting: Gastroenterology

## 2019-05-29 ENCOUNTER — Other Ambulatory Visit: Payer: Self-pay | Admitting: Family

## 2019-05-29 MED ORDER — PANTOPRAZOLE SODIUM 40 MG PO TBEC: 40.0000 mg | DELAYED_RELEASE_TABLET | Freq: Every day | ORAL | 0 refills | Status: DC

## 2019-05-29 MED ORDER — METHOCARBAMOL 500 MG PO TABS: 500.0000 mg | ORAL_TABLET | Freq: Three times a day (TID) | ORAL | 0 refills | Status: DC | PRN

## 2019-05-29 MED ORDER — METOCLOPRAMIDE HCL 5 MG PO TABS: 5.0000 mg | ORAL_TABLET | Freq: Three times a day (TID) | ORAL | 0 refills | Status: DC | PRN

## 2019-05-29 MED ORDER — LEVOTHYROXINE SODIUM 200 MCG PO TABS: 200.0000 ug | ORAL_TABLET | Freq: Every day | ORAL | 0 refills | Status: DC

## 2019-05-29 NOTE — Telephone Encounter (Signed)
Requested medication (s) are due for refill today: yes  Requested medication (s) are on the active medication list: yes  Last refill:  02/14/2019  Future visit scheduled: no  Notes to clinic: last filled by different provider This refill cannot be delegated  Review for refill   Requested Prescriptions  Pending Prescriptions Disp Refills   pantoprazole (PROTONIX) 40 MG tablet 30 tablet 0    Sig: Take 1 tablet (40 mg total) by mouth daily before breakfast. TAKE 1 TABLET BY MOUTH 30 MINUTES PRIOR TO BREAKFAST AND SUPPER      Gastroenterology: Proton Pump Inhibitors Passed - 05/29/2019 11:10 AM      Passed - Valid encounter within last 12 months    Recent Outpatient Visits           3 months ago Hypomagnesemia   [place], Marvis Repress, [place]   3 months ago Post-menopausal bleeding   [place], Marvis Repress, FNP   5 months ago Post-nasal drainage   [place], Marvis Repress, FNP   10 months ago Acute pain of right shoulder   [place], Marvis Repress, [place]   1 year ago Neck pain   [place], Marvis Repress, FNP       Future Appointments             In 4 weeks Elouise Munroe, MD [facility]              metoCLOPramide (REGLAN) 5 MG tablet 15 tablet 0    Sig: Take 1 tablet (5 mg total) by mouth every 8 (eight) hours as needed for nausea.      Not Delegated - Gastroenterology: Antiemetics Failed - 05/29/2019 11:10 AM      Failed - This refill cannot be delegated      Passed - Valid encounter within last 6 months    Recent Outpatient Visits           3 months ago Hypomagnesemia   [place], Marvis Repress, [place]   3 months ago Post-menopausal bleeding   [place], Marvis Repress, FNP   5 months ago  Post-nasal drainage   [facility] [place], Marvis Repress, FNP   10 months ago Acute pain of right shoulder   [place], Marvis Repress, [place]   1 year ago Neck pain   [place], Marvis Repress, FNP       Future Appointments             In 4 weeks Elouise Munroe, MD [facility]              methocarbamol (ROBAXIN) 500 MG tablet 30 tablet 0    Sig: Take 1 tablet (500 mg total) by mouth every 8 (eight) hours as needed for muscle spasms.      Not Delegated - Analgesics:  Muscle Relaxants Failed - 05/29/2019 11:10 AM      Failed - This refill cannot be delegated      Passed - Valid encounter within last 6 months    Recent Outpatient Visits           3 months ago Hypomagnesemia   [place], Marvis Repress, FNP   3 months ago Post-menopausal bleeding  [place], Marvis Repress, FNP   5 months ago Post-nasal drainage   [facility] [place], Marvis Repress, FNP   10 months ago Acute pain of right shoulder   [place], Marvis Repress, [place]   1 year ago Neck pain   [place], Marvis Repress, FNP       Future Appointments             In 4 weeks Elouise Munroe, MD [facility]             Signed Prescriptions Disp Refills   levothyroxine (SYNTHROID) 200 MCG tablet 90 tablet 0    Sig: Take 1 tablet (200 mcg total) by mouth daily before breakfast.      Endocrinology:  Hypothyroid Agents Failed - 05/29/2019 11:10 AM      Failed - TSH needs to be rechecked within 3 months after an abnormal result. Refill until TSH is due.      Passed - TSH in normal range and within 360 days    TSH  Date Value Ref Range Status  12/27/2018 0.47 0.35 - 4.50 uIU/mL Final           Passed - Valid encounter within last 12 months    Recent Outpatient Visits           3 months ago Hypomagnesemia   [place], Marvis Repress, [place]   3 months ago Post-menopausal bleeding   [place], Marvis Repress, FNP   5 months ago Post-nasal drainage   [place], Marvis Repress, [place]   10 months ago Acute pain of right shoulder   [place], Marvis Repress, [place]   1 year ago Neck pain   [place], Marvis Repress, [place]       Future Appointments             In 4 weeks Elouise Munroe, MD [place] [facility], [facility]

## 2019-05-29 NOTE — Telephone Encounter (Signed)
Copied from [place] [id number]. Topic: Quick Communication - Rx Refill/Question >> May 29, 2019 10:54 AM Mcneil, Ja-Kwan wrote: Medication: pantoprazole (PROTONIX) 40 MG tablet, methocarbamol (ROBAXIN) 500 MG tablet, metoCLOPramide (REGLAN) 5 MG tablet, and levothyroxine (SYNTHROID, LEVOTHROID) 200 MCG tablet  Has the patient contacted their pharmacy? yes   Preferred Pharmacy (with phone number or street name): [facility] [address]  Phone: [phone] Fax: [phone]  Agent: Please be advised that RX refills may take up to 3 business days. We ask that you follow-up with your pharmacy.

## 2019-05-31 ENCOUNTER — Ambulatory Visit (INDEPENDENT_AMBULATORY_CARE_PROVIDER_SITE_OTHER): Payer: Medicare Other | Admitting: Podiatry

## 2019-05-31 ENCOUNTER — Other Ambulatory Visit: Payer: Self-pay

## 2019-05-31 DIAGNOSIS — L600 Ingrowing nail: Secondary | ICD-10-CM

## 2019-05-31 DIAGNOSIS — L603 Nail dystrophy: Secondary | ICD-10-CM | POA: Diagnosis not present

## 2019-05-31 MED ORDER — GENTAMICIN SULFATE 0.1 % EX CREA: 1.0000 "application " | TOPICAL_CREAM | Freq: Two times a day (BID) | CUTANEOUS | 1 refills | Status: DC

## 2019-06-07 NOTE — Progress Notes (Signed)
Subjective: 74 y.o. female presenting today with a chief complaint of thickening and discoloration of the bilateral toenails that has been ongoing for the past several months. She reports h/o ingrown nails. She reports associated soreness of the toe. Wearing shoes increases the pain. She has not done anything for treatment. Patient is here for further evaluation and treatment.    Past Medical History:  Diagnosis Date  . Allergy   . ANEMIA, IRON DEFICIENCY 05/08/2009  . Angina   . ASYMPTOMATIC POSTMENOPAUSAL STATUS 10/11/2008  . Blood transfusion   . Blood transfusion without reported diagnosis   . Breast cancer ([place]) 09/29/11   invasive grade III ductal ca,assoc high grade dcis,ER/PR=neg  . C. difficile colitis   . Cataract   . Diverticulosis of colon (without mention of hemorrhage)   . Esophageal reflux 06/12/2008  . Gastroparesis   . GOITER, MULTINODULAR 04/02/2009  . Gout, unspecified   . H/O hiatal hernia   . History of lower GI bleeding   . History of radiation therapy 02/08/12-03/25/12   left breast,total 61gy  . Hypokalemia 05/11/2013  . Hypomagnesemia   . HYPOTHYROIDISM, POST-RADIATION 08/13/2009  . Internal hemorrhoids without mention of complication   . Kidney stones    "several"  . Leukopenia   . Migraines   . Obesity   . Osteoarthrosis, unspecified whether generalized or localized, unspecified site   . Other and unspecified hyperlipidemia   . Personal history of chemotherapy 2013  . Personal history of radiation therapy 2013   left  . PONV (postoperative nausea and vomiting)   . PUD (peptic ulcer disease)   . Short bowel syndrome   . Shortness of breath on exertion    "sometimes"  . Stricture and stenosis of esophagus   . Thyrotoxicosis without mention of goiter or other cause, without mention of thyrotoxic crisis or storm   . Type II or unspecified type diabetes mellitus without mention of complication, not stated as uncontrolled    no med in years diet  controled  . Unspecified essential hypertension   . UTI (urinary tract infection)   . Varicose veins   . VITAMIN B12 DEFICIENCY 08/30/2009    Objective:  General: Well developed, nourished, in no acute distress, alert and oriented x3   Dermatology: Hyperkeratotic, discolored, thickened, onychodystrophy of the right great toenail. Skin is warm, dry and supple bilateral lower extremities. Negative for open lesions or macerations.  Vascular: Dorsalis Pedis artery and Posterior Tibial artery pedal pulses palpable. No lower extremity edema noted.   Neruologic: Grossly intact via light touch bilateral.  Musculoskeletal: Muscular strength within normal limits in all groups bilateral. Normal range of motion noted to all pedal and ankle joints.   Assessment:  #1 Dystrophic nail of the right great toe  Plan of Care:  1. Patient evaluated.  2. Discussed treatment alternatives and plan of care. Explained nail avulsion procedure and post procedure course to patient. 3. Patient opted for total permanent nail avulsion of the right great toe.  4. Prior to procedure, local anesthesia infiltration utilized using 3 ml of a 50:50 mixture of 2% plain lidocaine and 0.5% plain marcaine in a normal hallux block fashion and a betadine prep performed.  5. Total permanent nail avulsion with chemical matrixectomy performed using [id number] applications of phenol followed by alcohol flush. 6. Light dressing applied. 7. Prescription for Gentamicin cream provided to patient to use daily with a bandage.  8. Return to clinic in 3 weeks.   Edrick Kins, DPM  [facility]  Dr. Edrick Kins, DPM    [address]                                        [place], [place] [address]                Office [phone]  Fax [phone]

## 2019-06-14 ENCOUNTER — Other Ambulatory Visit: Payer: Self-pay | Admitting: Family

## 2019-06-14 DIAGNOSIS — E785 Hyperlipidemia, unspecified: Secondary | ICD-10-CM

## 2019-06-14 DIAGNOSIS — M109 Gout, unspecified: Secondary | ICD-10-CM

## 2019-06-14 DIAGNOSIS — E559 Vitamin D deficiency, unspecified: Secondary | ICD-10-CM

## 2019-06-14 DIAGNOSIS — E119 Type 2 diabetes mellitus without complications: Secondary | ICD-10-CM

## 2019-06-14 NOTE — Telephone Encounter (Signed)
Requested medication (s) are due for refill today -yes  Requested medication (s) are on the active medication list -yes  Future visit scheduled -no  Last refill: 3 months ago  Notes to clinic: Patient is requesting refill of Rx filled by historical provider and another she may be due appointment for. Sent for review by PCP  Requested Prescriptions  Pending Prescriptions Disp Refills   amitriptyline (ELAVIL) 50 MG tablet       Sig: Take 1 tablet (50 mg total) by mouth at bedtime.      Psychiatry:  Antidepressants - Heterocyclics (TCAs) Passed - 06/14/2019 10:02 AM      Passed - Valid encounter within last 6 months    Recent Outpatient Visits           3 months ago Hypomagnesemia   [place], Marvis Repress, [place]   4 months ago Post-menopausal bleeding   [place], Marvis Repress, FNP   5 months ago Post-nasal drainage   [facility], Marvis Repress, FNP   11 months ago Acute pain of right shoulder   [place], Marvis Repress, [place]   1 year ago Neck pain   [place], Marvis Repress, FNP       Future Appointments             In 1 week Elouise Munroe, MD [facility]              allopurinol (ZYLOPRIM) 100 MG tablet 90 tablet 3    Sig: Take 1 tablet (100 mg total) by mouth daily.      Endocrinology:  Gout Agents Failed - 06/14/2019 10:02 AM      Failed - Uric Acid in normal range and within 360 days    Uric Acid, Serum  Date Value Ref Range Status  06/28/2017 4.4 2.4 - 7.0 mg/dL Final          Passed - Cr in normal range and within 360 days    Creatinine  Date Value Ref Range Status  08/18/2016 1.1 0.6 - 1.1 mg/dL Final   Creatinine, Ser  Date Value Ref Range Status  05/01/2019 1.16 0.40 - 1.20 mg/dL Final   Creatinine,U  Date Value Ref Range Status  06/28/2017  153.7 mg/dL Final          Passed - Valid encounter within last 12 months    Recent Outpatient Visits           3 months ago Hypomagnesemia   [place], Marvis Repress, [place]   4 months ago Post-menopausal bleeding   [place], Marvis Repress, [place]   5 months ago Post-nasal drainage   [place], Marvis Repress, [place]   11 months ago Acute pain of right shoulder   [place], Marvis Repress, [place]   1 year ago Neck pain   [place], Marvis Repress, FNP       Future Appointments             In 1 week Elouise Munroe, MD [facility]                Requested Prescriptions  Pending Prescriptions Disp Refills   amitriptyline (ELAVIL) 50 MG tablet       Sig:  Take 1 tablet (50 mg total) by mouth at bedtime.      Psychiatry:  Antidepressants - Heterocyclics (TCAs) Passed - 06/14/2019 10:02 AM      Passed - Valid encounter within last 6 months    Recent Outpatient Visits           3 months ago Hypomagnesemia   [place], Marvis Repress, [place]   4 months ago Post-menopausal bleeding   [place], Marvis Repress, FNP   5 months ago Post-nasal drainage   [facility] [place], Marvis Repress, FNP   11 months ago Acute pain of right shoulder   [place], Marvis Repress, [place]   1 year ago Neck pain   [place], Marvis Repress, FNP       Future Appointments             In 1 week Elouise Munroe, MD [facility]              allopurinol (ZYLOPRIM) 100 MG tablet 90 tablet 3    Sig: Take 1 tablet (100 mg total) by mouth daily.      Endocrinology:  Gout Agents Failed - 06/14/2019 10:02 AM      Failed  - Uric Acid in normal range and within 360 days    Uric Acid, Serum  Date Value Ref Range Status  06/28/2017 4.4 2.4 - 7.0 mg/dL Final          Passed - Cr in normal range and within 360 days    Creatinine  Date Value Ref Range Status  08/18/2016 1.1 0.6 - 1.1 mg/dL Final   Creatinine, Ser  Date Value Ref Range Status  05/01/2019 1.16 0.40 - 1.20 mg/dL Final   Creatinine,U  Date Value Ref Range Status  06/28/2017 153.7 mg/dL Final          Passed - Valid encounter within last 12 months    Recent Outpatient Visits           3 months ago Hypomagnesemia   [place], Marvis Repress, [place]   4 months ago Post-menopausal bleeding   [place], Marvis Repress, FNP   5 months ago Post-nasal drainage   [place], Marvis Repress, [place]   11 months ago Acute pain of right shoulder   [place], Marvis Repress, [place]   1 year ago Neck pain   [place], Marvis Repress, [place]       Future Appointments             In 1 week Elouise Munroe, MD [place] [facility]

## 2019-06-14 NOTE — Telephone Encounter (Signed)
Sorry Rx request forwarded to wrong office- please review for refill

## 2019-06-14 NOTE — Telephone Encounter (Signed)
Copied from [place] [id number]. Topic: Quick Communication - Rx Refill/Question >> Jun 14, 2019  9:58 AM Leward Quan A wrote: Medication: allopurinol (ZYLOPRIM) 100 MG tablet, amitriptyline (ELAVIL) 50 MG tablet   Has the patient contacted their pharmacy? Yes.   (Agent: If no, request that the patient contact the pharmacy for the refill.) (Agent: If yes, when and what did the pharmacy advise?)  Preferred Pharmacy (with phone number or street name): [facility] [address]  Phone:  [phone] Fax:  [phone]     Agent: Please be advised that RX refills may take up to 3 business days. We ask that you follow-up with your pharmacy.

## 2019-06-15 MED ORDER — AMITRIPTYLINE HCL 50 MG PO TABS: 50.0000 mg | ORAL_TABLET | Freq: Every day | ORAL | 3 refills | Status: DC

## 2019-06-15 MED ORDER — ALLOPURINOL 100 MG PO TABS: 100.0000 mg | ORAL_TABLET | Freq: Every day | ORAL | 3 refills | Status: DC

## 2019-06-16 ENCOUNTER — Ambulatory Visit (INDEPENDENT_AMBULATORY_CARE_PROVIDER_SITE_OTHER): Payer: Medicare Other | Admitting: Family

## 2019-06-16 ENCOUNTER — Encounter: Payer: Self-pay | Admitting: Family

## 2019-06-16 ENCOUNTER — Other Ambulatory Visit: Payer: Self-pay

## 2019-06-16 VITALS — BP 106/72 | HR 74 | Temp 97.9°F | Ht 65.0 in | Wt 220.6 lb

## 2019-06-16 DIAGNOSIS — E890 Postprocedural hypothyroidism: Secondary | ICD-10-CM | POA: Diagnosis not present

## 2019-06-16 DIAGNOSIS — R7303 Prediabetes: Secondary | ICD-10-CM

## 2019-06-16 DIAGNOSIS — G8929 Other chronic pain: Secondary | ICD-10-CM

## 2019-06-16 DIAGNOSIS — M25511 Pain in right shoulder: Secondary | ICD-10-CM

## 2019-06-16 LAB — COMPREHENSIVE METABOLIC PANEL
ALT: 19 U/L (ref 0–35)
AST: 25 U/L (ref 0–37)
Albumin: 4.2 g/dL (ref 3.5–5.2)
Alkaline Phosphatase: 83 U/L (ref 39–117)
BUN: 12 mg/dL (ref 6–23)
CO2: 30 mEq/L (ref 19–32)
Calcium: 9.6 mg/dL (ref 8.4–10.5)
Chloride: 101 mEq/L (ref 96–112)
Creatinine, Ser: 1.13 mg/dL (ref 0.40–1.20)
GFR: 56.77 mL/min — ABNORMAL LOW (ref >60.00)
Glucose, Bld: 85 mg/dL (ref 70–99)
Potassium: 3.7 mEq/L (ref 3.5–5.1)
Sodium: 141 mEq/L (ref 135–145)
Total Bilirubin: 0.8 mg/dL (ref 0.2–1.2)
Total Protein: 7.5 g/dL (ref 6.0–8.3)

## 2019-06-16 LAB — CBC WITH DIFFERENTIAL/PLATELET
Basophils Absolute: 0 10*3/uL (ref 0.0–0.1)
Basophils Relative: 0.3 % (ref 0.0–3.0)
Eosinophils Absolute: 0.1 10*3/uL (ref 0.0–0.7)
Eosinophils Relative: 1.2 % (ref 0.0–5.0)
HCT: 31.8 % — ABNORMAL LOW (ref 36.0–46.0)
Hemoglobin: 10.4 g/dL — ABNORMAL LOW (ref 12.0–15.0)
Lymphocytes Relative: 38.2 % (ref 12.0–46.0)
Lymphs Abs: 1.6 10*3/uL (ref 0.7–4.0)
MCHC: 32.8 g/dL (ref 30.0–36.0)
MCV: 90.2 fl (ref 78.0–100.0)
Monocytes Absolute: 0.4 10*3/uL (ref 0.1–1.0)
Monocytes Relative: 9.4 % (ref 3.0–12.0)
Neutro Abs: 2.1 10*3/uL (ref 1.4–7.7)
Neutrophils Relative %: 50.9 % (ref 43.0–77.0)
Platelets: 256 10*3/uL (ref 150.0–400.0)
RBC: 3.53 Mil/uL — ABNORMAL LOW (ref 3.87–5.11)
RDW: 13.6 % (ref 11.5–15.5)
WBC: 4.2 10*3/uL (ref 4.0–10.5)

## 2019-06-16 LAB — TSH: TSH: 8 u[IU]/mL — ABNORMAL HIGH (ref 0.35–4.50)

## 2019-06-16 LAB — HEMOGLOBIN A1C: Hgb A1c MFr Bld: 5.5 % (ref 4.6–6.5)

## 2019-06-16 LAB — MAGNESIUM: Magnesium: 1.6 mg/dL (ref 1.5–2.5)

## 2019-06-16 NOTE — Progress Notes (Signed)
[name] is a 74 y.o. female with the following history as recorded in EpicCare:  Patient Active Problem List   Diagnosis Date Noted  . 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 Dyspnea 02/16/2018  . 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 UTI (urinary tract infection) 11/27/2014  . UTI (lower urinary tract infection) 11/27/2014  . Acute cystitis with hematuria 11/13/2014  . Hemorrhoids, external 11/04/2014  . Wellness examination 10/03/2014  . Pain in joint, shoulder region 09/24/2014  . Cramp in limb 06/26/2014  . Rectal bleeding 05/02/2014  . External hemorrhoids 05/02/2014  . Hemorrhoids without complication 28/36/6294  . Pain in joint, lower leg 02/01/2014  . Vitamin D deficiency 01/16/2014  . Gastroparesis 09/14/2013  . Other dysphagia 09/04/2013  . Nausea alone 09/04/2013  . Iron deficiency anemia 08/28/2013  . Hypomagnesemia 05/11/2013  . Hypokalemia 05/11/2013  . Generalized weakness 05/11/2013  . Dehydration 11/25/2011  . Fatigue 11/16/2011  . Breast cancer ([place]) 09/29/2011  . Family history of breast cancer 08/19/2011  . Primary cancer of lower-inner quadrant of left female breast ([place]) 08/17/2011  . Neck pain 07/29/2011  . Routine general medical examination at a health care facility 06/28/2011  . Edema 11/26/2010  . CLOSTRIDIUM DIFFICILE COLITIS 02/07/2010  . Blind loop syndrome 10/01/2009  . Abdominal pain 10/01/2009  . Vitamin B 12 deficiency 08/30/2009  . 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 Rash and nonspecific skin eruption 08/13/2009  . GOITER, MULTINODULAR 04/02/2009  . CONTACT DERMATITIS&OTHER ECZEMA DUE UNSPEC CAUSE 02/20/2009  . URINARY CALCULUS 10/11/2008  . Asymptomatic menopausal state 10/11/2008  . BACK PAIN, CHRONIC 07/03/2008  . Esophageal reflux 06/12/2008  . Diarrhea 06/12/2008  . ABDOMINAL PAIN-RUQ 05/17/2008  . Diabetes ([place]) 12/10/2006  . Dyslipidemia 12/10/2006  . Gout 12/10/2006  .  HYPERTENSION 12/10/2006  . DIVERTICULOSIS, COLON 12/10/2006  . OSTEOARTHRITIS 12/10/2006  . ESOPHAGEAL STRICTURE 08/23/2002  . HIATAL HERNIA 08/23/2002  . INTERNAL HEMORRHOIDS 02/02/2001    Current Outpatient Medications  Medication Sig Dispense Refill  . allopurinol (ZYLOPRIM) 100 MG tablet Take 1 tablet (100 mg total) by mouth daily. 90 tablet 3  . amitriptyline (ELAVIL) 50 MG tablet Take 1 tablet (50 mg total) by mouth at bedtime. 90 tablet 3  . calcium carbonate (TUMS - DOSED IN MG ELEMENTAL CALCIUM) 500 MG chewable tablet Chew 1 tablet by mouth as needed for indigestion or heartburn.    . Calcium Carbonate-Vitamin D (CALCIUM-VITAMIN D) 600-200 MG-UNIT CAPS Take 1 capsule by mouth daily.      . Cyanocobalamin (VITAMIN B 12 PO) Take 1 tablet by mouth 2 (two) times daily.     . diphenoxylate-atropine (LOMOTIL) 2.5-0.025 MG tablet Take 1 tablet by mouth 3 times daily as needed for diarrhea for >3-4 stool /day. 270 tablet 0  . famotidine (PEPCID) 40 MG tablet Take 1 tablet (40 mg total) by mouth at bedtime. 90 tablet 3  . ferrous sulfate 325 (65 FE) MG tablet Take 325 mg by mouth daily with breakfast.    . fluticasone (FLONASE) 50 MCG/ACT nasal spray Place 2 sprays into both nostrils daily. (Patient taking differently: Place 2 sprays into both nostrils daily as needed. ) 16 g 6  . furosemide (LASIX) 20 MG tablet Take 1 tablet (20 mg total) by mouth daily as needed for edema. 90 tablet 1  . gentamicin cream (GARAMYCIN) 0.1 % Apply 1 application topically 2 (two) times daily. 15 g 1  . levothyroxine (SYNTHROID) 200 MCG tablet Take 1 tablet (  200 mcg total) by mouth daily before breakfast. 90 tablet 0  . loratadine (CLARITIN) 10 MG tablet Take 1 tablet (10 mg total) by mouth daily. 30 tablet 11  . lovastatin (MEVACOR) 20 MG tablet TAKE 1 TABLET BY MOUTH  DAILY AT 6 PM. 90 tablet 2  . Magnesium 500 MG CAPS Take 1 mg by mouth 2 (two) times daily.     . methocarbamol (ROBAXIN) 500 MG tablet Take 1  tablet (500 mg total) by mouth every 8 (eight) hours as needed for muscle spasms. 30 tablet 0  . metoCLOPramide (REGLAN) 5 MG tablet Take 1 tablet (5 mg total) by mouth every 8 (eight) hours as needed for nausea. 15 tablet 0  . nitroGLYCERIN (NITROSTAT) 0.4 MG SL tablet Place 1 tablet (0.4 mg total) under the tongue every 5 (five) minutes as needed. 25 tablet 6  . pantoprazole (PROTONIX) 40 MG tablet Take 1 tablet (40 mg total) by mouth daily before breakfast. TAKE 1 TABLET BY MOUTH 30 MINUTES PRIOR TO BREAKFAST AND SUPPER 30 tablet 0  . sucralfate (CARAFATE) 1 g tablet Please crush 1 gram tablet and mix with water to make it into a slurry. Use 2 times daily for 2 weeks. 28 tablet 0  . Thiamine HCl (THIAMINE PO) Take 1 tablet by mouth daily.     Marland Kitchen dicyclomine (BENTYL) 20 MG tablet Take 1 tablet (20 mg total) by mouth 3 (three) times daily as needed for spasms. (Patient not taking: Reported on 05/12/2019) 360 tablet 3   No current facility-administered medications for this visit.   Facility-Administered Medications Ordered in Other Visits  Medication Dose Route Frequency Provider Last Rate Last Admin  . acetaminophen (TYLENOL) tablet 1,000 mg  1,000 mg Oral Once Amada Kingfisher, MD        Allergies: Aspirin, Iodinated diagnostic agents, Trazodone and nefazodone, Flagyl [metronidazole], Iodine, and Morphine and related  Past Medical History:  Diagnosis Date  . Allergy   . ANEMIA, IRON DEFICIENCY 05/08/2009  . Angina   . ASYMPTOMATIC POSTMENOPAUSAL STATUS 10/11/2008  . Blood transfusion   . Blood transfusion without reported diagnosis   . Breast cancer ([place]) 09/29/11   invasive grade III ductal ca,assoc high grade dcis,ER/PR=neg  . C. difficile colitis   . Cataract   . Diverticulosis of colon (without mention of hemorrhage)   . Esophageal reflux 06/12/2008  . Gastroparesis   . GOITER, MULTINODULAR 04/02/2009  . Gout, unspecified   . H/O hiatal hernia   . History of lower GI bleeding   .  History of radiation therapy 02/08/12-03/25/12   left breast,total 61gy  . Hypokalemia 05/11/2013  . Hypomagnesemia   . HYPOTHYROIDISM, POST-RADIATION 08/13/2009  . Internal hemorrhoids without mention of complication   . Kidney stones    "several"  . Leukopenia   . Migraines   . Obesity   . Osteoarthrosis, unspecified whether generalized or localized, unspecified site   . Other and unspecified hyperlipidemia   . Personal history of chemotherapy 2013  . Personal history of radiation therapy 2013   left  . PONV (postoperative nausea and vomiting)   . PUD (peptic ulcer disease)   . Short bowel syndrome   . Shortness of breath on exertion    "sometimes"  . Stricture and stenosis of esophagus   . Thyrotoxicosis without mention of goiter or other cause, without mention of thyrotoxic crisis or storm   . Type II or unspecified type diabetes mellitus without mention of complication, not stated as uncontrolled  no med in years diet controled  . Unspecified essential hypertension   . UTI (urinary tract infection)   . Varicose veins   . VITAMIN B12 DEFICIENCY 08/30/2009    Past Surgical History:  Procedure Laterality Date  . ABDOMINAL ADHESION SURGERY  1980's thru 1990's   "several"  . ABDOMINAL HYSTERECTOMY  1970's   with BSO  . BREAST BIOPSY Left 08/13/11   left breast lower inner quadrant  . BREAST BIOPSY Right 1985   Rt exc bx, benign  . BREAST LUMPECTOMY Left 08/2011  . BREAST LUMPECTOMY W/ NEEDLE LOCALIZATION  09/29/11   left  breast=lymph node,excision benign/ ER/PR=neg, her 2 Positive  . BUNIONECTOMY  1970's   bilateral  . CHOLECYSTECTOMY  1990's  . COLON SURGERY     "several surgeries for short bowel syndrome"  . COLONOSCOPY  2012   multiple   . DILATION AND CURETTAGE OF UTERUS    . ESOPHAGOGASTRODUODENOSCOPY  2011   multiple   . EXCISIONAL HEMORRHOIDECTOMY  11/10/2016  . EYE SURGERY     "long time ago"  . FLEXIBLE SIGMOIDOSCOPY  2011   multiple   . KIDNEY STONE  SURGERY  1990's   "tried to go up & get it but pushed it further up"  . LITHOTRIPSY     "4 or 5 times"  . MASTECTOMY W/ NODES PARTIAL  09/29/11   left  . PORT-A-CATH REMOVAL Right 12/19/2013   Procedure: MINOR REMOVAL PORT-A-CATH;  Surgeon: Adin Hector, MD;  Location: [facility];  Service: General;  Laterality: Right;  . PORTACATH PLACEMENT  09/29/2011   Procedure: INSERTION PORT-A-CATH;  Surgeon: Adin Hector, MD;  Location: [facility];  Service: General;  Laterality: N/A;  . SMALL INTESTINE SURGERY    . Thyroid Ultrasound  12/1994 and 12/1995  . TOTAL KNEE ARTHROPLASTY Right 06/05/2015   Procedure: RIGHT TOTAL KNEE ARTHROPLASTY;  Surgeon: Ninetta Lights, MD;  Location: [facility];  Service: Orthopedics;  Laterality: Right;  . UPPER GASTROINTESTINAL ENDOSCOPY    . VEIN LIGATION AND STRIPPING  1980's   Right leg    Family History  Problem Relation Age of Onset  . Esophageal cancer Son        deceased  . Breast cancer Son        esophageal  . Diabetes Mother   . Heart disease Mother   . Kidney disease Sister   . Diabetes Father   . Hypertension Father   . Kidney disease Brother        x 3  . Colon cancer Paternal Uncle   . Breast cancer Maternal Aunt   . Breast cancer Paternal Aunt   . Breast cancer Cousin        Pt states she has 15+ cousins w/ Breast CA  . Rectal cancer Neg Hx   . Stomach cancer Neg Hx     Social History   Tobacco Use  . Smoking status: Former Smoker    Packs/day: 1.00    Years: 10.00    Pack years: 10.00    Types: Cigarettes    Quit date: 09/22/1985    Years since quitting: 33.7  . Smokeless tobacco: Never Used  Substance Use Topics  . Alcohol use: No    Comment: 09/29/11 "used to drink socially years ago"    Subjective:  Right shoulder pain x 2 days; known right shoulder arthritis- has been taking Tylenol with limited benefit; per patient report, she told me last year that she  saw [employer] with this same symptom last year  but has not seen them with any type of follow-up. Denies any numbness or tingling into her fingertips; very limited range of motion in her shoulder due to the pain;  Also needs to have her magnesium level re-checked and wants to make sure her blood sugar is normal; she felt like her vision was slightly blurry earlier this week.     Objective:  Vitals:   06/16/19 1327  BP: 106/72  Pulse: 74  Temp: 97.9 F (36.6 C)  TempSrc: Oral  SpO2: 98%  Weight: 220 lb 9.6 oz (100.1 kg)  Height: 5' 5"  (1.651 m)    General: Well developed, well nourished, in no acute distress  Skin : Warm and dry.  Head: Normocephalic and atraumatic  Lungs: Respirations unlabored; clear to auscultation bilaterally without wheeze, rales, rhonchi  Musculoskeletal: No deformities; no active joint inflammation; LROM on active motion of right shoulder  Extremities: No edema, cyanosis, clubbing  Vessels: Symmetric bilaterally  Neurologic: Alert and oriented; speech intact; face symmetrical; moves all extremities well; CNII-XII intact without focal deficit   Assessment:  1. Hypothyroidism following radioiodine therapy   2. Pre-diabetes   3. Hypomagnesemia   4. Chronic right shoulder pain     Plan:  1. Check TSH today; 2. Check CMP, hgba1c today; 3. Check magnesium level to make sure it is normal; 4. Patient is to continue using her Tylenol and Robaxin; she will return for U/S and possible injection with sports medicine next week.   This visit occurred during the SARS-CoV-2 public health emergency.  Safety protocols were in place, including screening questions prior to the visit, additional usage of staff PPE, and extensive cleaning of exam room while observing appropriate contact time as indicated for disinfecting solutions.     Return for sports medicine for next week.  Orders Placed This Encounter  Procedures  . Magnesium  . TSH  . CBC w/Diff  . Comp Met (CMET)  . HgB A1c    Requested Prescriptions     No prescriptions requested or ordered in this encounter

## 2019-06-19 ENCOUNTER — Ambulatory Visit (INDEPENDENT_AMBULATORY_CARE_PROVIDER_SITE_OTHER): Payer: Medicare Other

## 2019-06-19 ENCOUNTER — Ambulatory Visit (INDEPENDENT_AMBULATORY_CARE_PROVIDER_SITE_OTHER): Payer: Medicare Other | Admitting: Family Medicine

## 2019-06-19 ENCOUNTER — Other Ambulatory Visit: Payer: Self-pay | Admitting: Family

## 2019-06-19 ENCOUNTER — Encounter: Payer: Self-pay | Admitting: Family Medicine

## 2019-06-19 ENCOUNTER — Other Ambulatory Visit: Payer: Self-pay

## 2019-06-19 VITALS — BP 138/88 | HR 93 | Ht 65.0 in | Wt 225.2 lb

## 2019-06-19 DIAGNOSIS — G8929 Other chronic pain: Secondary | ICD-10-CM

## 2019-06-19 DIAGNOSIS — M542 Cervicalgia: Secondary | ICD-10-CM

## 2019-06-19 DIAGNOSIS — M25512 Pain in left shoulder: Secondary | ICD-10-CM | POA: Diagnosis not present

## 2019-06-19 DIAGNOSIS — M25511 Pain in right shoulder: Secondary | ICD-10-CM | POA: Diagnosis not present

## 2019-06-19 MED ORDER — LEVOTHYROXINE SODIUM 300 MCG PO TABS: 300.0000 ug | ORAL_TABLET | Freq: Every day | ORAL | 0 refills | Status: DC

## 2019-06-19 MED ORDER — CYCLOBENZAPRINE HCL 5 MG PO TABS: 5.0000 mg | ORAL_TABLET | Freq: Three times a day (TID) | ORAL | 1 refills | Status: DC | PRN

## 2019-06-19 NOTE — Progress Notes (Signed)
Subjective:    I'm seeing this patient as a consultation for:  Rachel Mourning, FNP. Note will be routed back to referring provider/PCP.  CC: Neck pain and B upper trap  I, [name], LAT, ATC, am serving as scribe for Dr. [name].  HPI: Pt is a 74 y/o female presenting w/ left-sided neck pain.  Patient notes significantly worsening left-sided neck pain ongoing for the last 1 week.  She denies any injury.  She notes pain refers to the left trapezius and upper back.  She denies any pain radiating below the level of her shoulders.  She is tried some over-the-counter medications and heating pad for pain which are not very helpful.  Pain is severe and interfering with sleep.  She also notes that she is having trouble looking over her left shoulder especially interfering with her ability to drive a car safely.    R shoulder pain x at least one year worsening over the past 5 days after waking up w/ R-sided neck pain that has transitioned to B neck pain L>R.  She was seen at [facility] last year for her R shoulder and was diagnosed w/ arthritis.  She notes her right shoulder pain is relatively mild compared to her severe neck pain.  She thinks that she cannot focus on her shoulder pain now because of the neck pain.  Radiating pain: into B upper traps Mechanical symptoms: Neck pain: Yes, lateral neck pain Numbness/tingling: No Aggravating factors: cervical rotation, L sidelying, B shoulder AROM above 90 deg Treatments tried: Tylenol  Past medical history, Surgical history, Family history, Social history, Allergies, and medications have been entered into the medical record, reviewed. Review of Systems: No new headache, visual changes, nausea, vomiting, diarrhea, constipation, dizziness, abdominal pain, skin rash, fevers, chills, night sweats, weight loss, swollen lymph nodes, body aches, joint swelling, muscle aches, chest pain, shortness of breath, mood changes, visual or auditory  hallucinations.   Objective:    Vitals:   06/19/19 0830  BP: 138/88  Pulse: 93  SpO2: 97%   General: Well Developed, well nourished, and in no acute distress.  Neuro/Psych: Alert and oriented x3, extra-ocular muscles intact, able to move all 4 extremities, sensation grossly intact. Skin: Warm and dry, no rashes noted.  Respiratory: Not using accessory muscles, speaking in full sentences, trachea midline.  Cardiovascular: Pulses palpable, no extremity edema. Abdomen: Does not appear distended. MSK:  C-spine: Normal-appearing Tender palpation spinal midline around C7.  Tender palpation left cervical paraspinal musculature and left trapezius. Significant lack of range of motion flexion and extension.  Significant lack range of motion left rotation and right lateral flexion. Upper extremity strength intact bilaterally. Reflexes equal bilateral upper extremities. Sensation is intact throughout.  Bilateral shoulder exam limited by guarding from neck pain.  Decreased range of motion bilaterally.  Unable to truly position for isolated dedicated shoulder strength testing.  Unable to position for impingement testing bilaterally.  Lab and Radiology Results  X-ray images C-spine obtained today personally and independently reviewed DDD with osteophyte at C4-5 C5-6 and C6-7.  No acute fractures.  Facet DJD present throughout cervical spine as well.  No lytic lesions present. Await formal radiology review   Impression and Recommendations:    Assessment and Plan: 74 y.o. female with left-sided cervical neck pain ongoing for approximately 1 week.  Exam and history consistent with cervical strain and myofascial spasm and dysfunction.  X-ray today shows degenerative changes however radiology overread is still pending.  Plan for  soft cervical collar to use as needed, referral to physical therapy, limited cyclobenzaprine, and continued eating pad.  Recheck in 1 month.  At that time cervical pain  should be improved and will be able to focus more on shoulder pain which was deferred today because of her overwhelming neck pain.  PDMP not reviewed this encounter. Orders Placed This Encounter  Procedures  . DG Cervical Spine Complete    Standing Status:   Future    Number of Occurrences:   1    Standing Expiration Date:   08/16/2020    Order Specific Question:   Reason for Exam (SYMPTOM  OR DIAGNOSIS REQUIRED)    Answer:   eval pain left cspine    Order Specific Question:   Preferred imaging location?    Answer:   [facility]    Order Specific Question:   Radiology Contrast Protocol - do NOT remove file path    Answer:   [email or web address]  . Ambulatory referral to Physical Therapy    Referral Priority:   Routine    Referral Type:   Physical Medicine    Referral Reason:   Specialty Services Required    Requested Specialty:   Physical Therapy   Meds ordered this encounter  Medications  . cyclobenzaprine (FLEXERIL) 5 MG tablet    Sig: Take 1-2 tablets (5-10 mg total) by mouth 3 (three) times daily as needed for muscle spasms. Take mostly at bedtime.    Dispense:  30 tablet    Refill:  1    Discussed warning signs or symptoms. Please see discharge instructions. Patient expresses understanding.   The above documentation has been reviewed and is accurate and complete [name]

## 2019-06-19 NOTE — Patient Instructions (Addendum)
Thank you for coming in today. Use heating pad.  Use muscle relaxer sparingly.  Attend PT.  Get xray today on your way out.  Recheck in 4 weeks.  Return sooner if needed.   Come back or go to the emergency room if you notice new weakness new numbness problems walking or bowel or bladder problems.

## 2019-06-20 NOTE — Progress Notes (Signed)
Cervical spine shows multilevel arthritis.  No fractures.  No tumors.

## 2019-06-21 ENCOUNTER — Ambulatory Visit (INDEPENDENT_AMBULATORY_CARE_PROVIDER_SITE_OTHER): Payer: Medicare Other | Admitting: Podiatry

## 2019-06-21 ENCOUNTER — Other Ambulatory Visit: Payer: Self-pay

## 2019-06-21 DIAGNOSIS — L603 Nail dystrophy: Secondary | ICD-10-CM

## 2019-06-21 DIAGNOSIS — L600 Ingrowing nail: Secondary | ICD-10-CM | POA: Diagnosis not present

## 2019-06-23 ENCOUNTER — Telehealth: Payer: Self-pay | Admitting: Family Medicine

## 2019-06-23 MED ORDER — HYDROCODONE-ACETAMINOPHEN 5-325 MG PO TABS: 1.0000 | ORAL_TABLET | Freq: Four times a day (QID) | ORAL | 0 refills | Status: DC | PRN

## 2019-06-23 NOTE — Telephone Encounter (Signed)
Hydrocodone prescribed 

## 2019-06-23 NOTE — Telephone Encounter (Signed)
[name] called stating that she was been by Dr Georgina Snell on 06/19/2019 and was prescribed a muscle relaxer. She said that this has not helped and she is still having a lot of pain in her shoulder.   Please advise.

## 2019-06-23 NOTE — Telephone Encounter (Signed)
Called pt and she would like a different pain medication sent in to her pharmacy - [facility] on [address].  States that she does have PT scheduled for next week

## 2019-06-23 NOTE — Telephone Encounter (Signed)
I can prescribe stronger pain medicine. Would she like me to do that? Additionally physical therapy will help. Has she scheduled PT yet?  Ellard Artis

## 2019-06-23 NOTE — Progress Notes (Signed)
   Subjective: 74 y.o. female presenting today status post total permanent nail avulsion procedure of the right great toenail that was performed on 05/31/2019. She states she is doing well. She denies any significant pain or modifying factors. She has been applying Gentamicin cream as directed. Patient is here for further evaluation and treatment.    Past Medical History:  Diagnosis Date  . Allergy   . ANEMIA, IRON DEFICIENCY 05/08/2009  . Angina   . ASYMPTOMATIC POSTMENOPAUSAL STATUS 10/11/2008  . Blood transfusion   . Blood transfusion without reported diagnosis   . Breast cancer ([place]) 09/29/11   invasive grade III ductal ca,assoc high grade dcis,ER/PR=neg  . C. difficile colitis   . Cataract   . Diverticulosis of colon (without mention of hemorrhage)   . Esophageal reflux 06/12/2008  . Gastroparesis   . GOITER, MULTINODULAR 04/02/2009  . Gout, unspecified   . H/O hiatal hernia   . History of lower GI bleeding   . History of radiation therapy 02/08/12-03/25/12   left breast,total 61gy  . Hypokalemia 05/11/2013  . Hypomagnesemia   . HYPOTHYROIDISM, POST-RADIATION 08/13/2009  . Internal hemorrhoids without mention of complication   . Kidney stones    "several"  . Leukopenia   . Migraines   . Obesity   . Osteoarthrosis, unspecified whether generalized or localized, unspecified site   . Other and unspecified hyperlipidemia   . Personal history of chemotherapy 2013  . Personal history of radiation therapy 2013   left  . PONV (postoperative nausea and vomiting)   . PUD (peptic ulcer disease)   . Short bowel syndrome   . Shortness of breath on exertion    "sometimes"  . Stricture and stenosis of esophagus   . Thyrotoxicosis without mention of goiter or other cause, without mention of thyrotoxic crisis or storm   . Type II or unspecified type diabetes mellitus without mention of complication, not stated as uncontrolled    no med in years diet controled  . Unspecified essential  hypertension   . UTI (urinary tract infection)   . Varicose veins   . VITAMIN B12 DEFICIENCY 08/30/2009     Objective: Skin is warm, dry and supple. Nail bed and respective nail fold appears to be healing appropriately. Open wound to the associated nail fold with a granular wound base and moderate amount of fibrotic tissue. Minimal drainage noted. Mild erythema around the periungual region likely due to phenol chemical matricectomy.  Assessment: #1 postop permanent total nail avulsion right great toe  Plan of care: #1 patient was evaluated  #2 light debridement of open wound was performed to the periungual border of the respective toe using a currette and tissue nipper. Antibiotic ointment and Band-Aid was applied. #3 patient is to return to clinic on a PRN  basis.   Edrick Kins, DPM [facility]  Dr. Edrick Kins, DPM    [address], [place] [address]                Office [phone]  Fax [phone]

## 2019-06-26 ENCOUNTER — Encounter: Payer: Self-pay | Admitting: Internal Medicine

## 2019-06-26 ENCOUNTER — Other Ambulatory Visit: Payer: Self-pay

## 2019-06-26 ENCOUNTER — Ambulatory Visit (INDEPENDENT_AMBULATORY_CARE_PROVIDER_SITE_OTHER): Payer: Medicare Other | Admitting: Internal Medicine

## 2019-06-26 VITALS — BP 129/86 | HR 83 | Temp 96.9°F | Ht 65.0 in | Wt 224.4 lb

## 2019-06-26 DIAGNOSIS — R0600 Dyspnea, unspecified: Secondary | ICD-10-CM | POA: Diagnosis not present

## 2019-06-26 DIAGNOSIS — Q2544 Congenital dilation of aorta: Secondary | ICD-10-CM | POA: Diagnosis not present

## 2019-06-26 DIAGNOSIS — R0609 Other forms of dyspnea: Secondary | ICD-10-CM

## 2019-06-26 DIAGNOSIS — R600 Localized edema: Secondary | ICD-10-CM

## 2019-06-26 DIAGNOSIS — Q2549 Other congenital malformations of aorta: Secondary | ICD-10-CM

## 2019-06-26 MED ORDER — FUROSEMIDE 40 MG PO TABS: 40.0000 mg | ORAL_TABLET | Freq: Every day | ORAL | 3 refills | Status: DC

## 2019-06-26 NOTE — Progress Notes (Signed)
Cardiology Office Note:    Date: 06/26/2019   ID:  [name], DOB 11/14/1945, MRN [id number]  PCP:  Marrian Salvage, [place]  Cardiologist:  No primary care provider on file.  Electrophysiologist:  None   Referring MD: Marrian Salvage,*   Chief Complaint: f/u, DOE  History of Present Illness:    [name] is a 74 y.o. female with a hx of left breast cancer with chemotherapy and radiation, GERD, hypothyroidism, mild CAD by CCTA, and diabetes mellitus type 2 diet controlled.  She presents today for follow-up and new concern of dyspnea on exertion.  She tells me that she has been experiencing dyspnea on exertion when walking to her car and feels that it started approximately 1 to 2 weeks ago.  She notes that house chores tire her out, and she does not recall feeling this way on our previous encounters.  Has hypothyroidism and TSH was recently 35.  Medication adjustments are pending.  She has a known dilated sinuses of Valsalva which has been relatively stable over time.  She has comparable measurements on echo and CT.  She has trivial aortic valve regurgitation on her echo from 2019.  She is noted to have iron deficiency anemia.  She is taking ferrous sulfate.  Last hemoglobin 10.4.  She has noticed increased swelling in her legs and feet and has been taking Lasix 20 mg a day.  She has chronic kidney disease and follows with nephrology annually.  She feels that her weight has increased, 10 days ago it was 220 pounds, today is 224 pounds.  Creatinine was 1.13 recently, in September was 0.8.  Baseline in the recent past appears to be closer to 1-1.1.  She denies significant chest pain or chest pressure.  Denies syncope or presyncope.  Denies dizziness or lightheadedness.  She is wearing a neck brace today due to neck muscle spasms and is following with PT.  Past Medical History:  Diagnosis Date  . Allergy   . ANEMIA, IRON DEFICIENCY 05/08/2009  . Angina   .  ASYMPTOMATIC POSTMENOPAUSAL STATUS 10/11/2008  . Blood transfusion   . Blood transfusion without reported diagnosis   . Breast cancer ([place]) 09/29/11   invasive grade III ductal ca,assoc high grade dcis,ER/PR=neg  . C. difficile colitis   . Cataract   . Diverticulosis of colon (without mention of hemorrhage)   . Esophageal reflux 06/12/2008  . Gastroparesis   . GOITER, MULTINODULAR 04/02/2009  . Gout, unspecified   . H/O hiatal hernia   . History of lower GI bleeding   . History of radiation therapy 02/08/12-03/25/12   left breast,total 61gy  . Hypokalemia 05/11/2013  . Hypomagnesemia   . HYPOTHYROIDISM, POST-RADIATION 08/13/2009  . Internal hemorrhoids without mention of complication   . Kidney stones    "several"  . Leukopenia   . Migraines   . Obesity   . Osteoarthrosis, unspecified whether generalized or localized, unspecified site   . Other and unspecified hyperlipidemia   . Personal history of chemotherapy 2013  . Personal history of radiation therapy 2013   left  . PONV (postoperative nausea and vomiting)   . PUD (peptic ulcer disease)   . Short bowel syndrome   . Shortness of breath on exertion    "sometimes"  . Stricture and stenosis of esophagus   . Thyrotoxicosis without mention of goiter or other cause, without mention of thyrotoxic crisis or storm   . Type II or unspecified type diabetes mellitus without mention  of complication, not stated as uncontrolled    no med in years diet controled  . Unspecified essential hypertension   . UTI (urinary tract infection)   . Varicose veins   . VITAMIN B12 DEFICIENCY 08/30/2009    Past Surgical History:  Procedure Laterality Date  . ABDOMINAL ADHESION SURGERY  1980's thru 1990's   "several"  . ABDOMINAL HYSTERECTOMY  1970's   with BSO  . BREAST BIOPSY Left 08/13/11   left breast lower inner quadrant  . BREAST BIOPSY Right 1985   Rt exc bx, benign  . BREAST LUMPECTOMY Left 08/2011  . BREAST LUMPECTOMY W/ NEEDLE  LOCALIZATION  09/29/11   left  breast=lymph node,excision benign/ ER/PR=neg, her 2 Positive  . BUNIONECTOMY  1970's   bilateral  . CHOLECYSTECTOMY  1990's  . COLON SURGERY     "several surgeries for short bowel syndrome"  . COLONOSCOPY  2012   multiple   . DILATION AND CURETTAGE OF UTERUS    . ESOPHAGOGASTRODUODENOSCOPY  2011   multiple   . EXCISIONAL HEMORRHOIDECTOMY  11/10/2016  . EYE SURGERY     "long time ago"  . FLEXIBLE SIGMOIDOSCOPY  2011   multiple   . KIDNEY STONE SURGERY  1990's   "tried to go up & get it but pushed it further up"  . LITHOTRIPSY     "4 or 5 times"  . MASTECTOMY W/ NODES PARTIAL  09/29/11   left  . PORT-A-CATH REMOVAL Right 12/19/2013   Procedure: MINOR REMOVAL PORT-A-CATH;  Surgeon: Adin Hector, MD;  Location: [facility];  Service: General;  Laterality: Right;  . PORTACATH PLACEMENT  09/29/2011   Procedure: INSERTION PORT-A-CATH;  Surgeon: Adin Hector, MD;  Location: [facility];  Service: General;  Laterality: N/A;  . SMALL INTESTINE SURGERY    . Thyroid Ultrasound  12/1994 and 12/1995  . TOTAL KNEE ARTHROPLASTY Right 06/05/2015   Procedure: RIGHT TOTAL KNEE ARTHROPLASTY;  Surgeon: Ninetta Lights, MD;  Location: [facility];  Service: Orthopedics;  Laterality: Right;  . UPPER GASTROINTESTINAL ENDOSCOPY    . VEIN LIGATION AND STRIPPING  1980's   Right leg    Current Medications: Current Meds  Medication Sig  . allopurinol (ZYLOPRIM) 100 MG tablet Take 1 tablet (100 mg total) by mouth daily.  Marland Kitchen amitriptyline (ELAVIL) 50 MG tablet Take 1 tablet (50 mg total) by mouth at bedtime.  . calcium carbonate (TUMS - DOSED IN MG ELEMENTAL CALCIUM) 500 MG chewable tablet Chew 1 tablet by mouth as needed for indigestion or heartburn.  . Calcium Carbonate-Vitamin D (CALCIUM-VITAMIN D) 600-200 MG-UNIT CAPS Take 1 capsule by mouth daily.    . Cyanocobalamin (VITAMIN B 12 PO) Take 1 tablet by mouth 2 (two) times daily.   . cyclobenzaprine (FLEXERIL) 5 MG  tablet Take 1-2 tablets (5-10 mg total) by mouth 3 (three) times daily as needed for muscle spasms. Take mostly at bedtime.  . dicyclomine (BENTYL) 20 MG tablet Take 1 tablet (20 mg total) by mouth 3 (three) times daily as needed for spasms.  . diphenoxylate-atropine (LOMOTIL) 2.5-0.025 MG tablet Take 1 tablet by mouth 3 times daily as needed for diarrhea for >3-4 stool /day.  . famotidine (PEPCID) 40 MG tablet Take 1 tablet (40 mg total) by mouth at bedtime.  . ferrous sulfate 325 (65 FE) MG tablet Take 325 mg by mouth daily with breakfast.  . fluticasone (FLONASE) 50 MCG/ACT nasal spray Place 2 sprays into both nostrils daily. (Patient taking differently: Place  2 sprays into both nostrils daily as needed. )  . gentamicin cream (GARAMYCIN) 0.1 % Apply 1 application topically 2 (two) times daily.  Marland Kitchen HYDROcodone-acetaminophen (NORCO/VICODIN) 5-325 MG tablet Take 1 tablet by mouth every 6 (six) hours as needed.  Marland Kitchen levothyroxine (SYNTHROID) 300 MCG tablet Take 1 tablet (300 mcg total) by mouth daily before breakfast.  . loratadine (CLARITIN) 10 MG tablet Take 1 tablet (10 mg total) by mouth daily.  Marland Kitchen lovastatin (MEVACOR) 20 MG tablet TAKE 1 TABLET BY MOUTH  DAILY AT 6 PM.  . Magnesium 500 MG CAPS Take 1 mg by mouth 2 (two) times daily.   . methocarbamol (ROBAXIN) 500 MG tablet Take 1 tablet (500 mg total) by mouth every 8 (eight) hours as needed for muscle spasms.  . metoCLOPramide (REGLAN) 5 MG tablet Take 1 tablet (5 mg total) by mouth every 8 (eight) hours as needed for nausea.  . nitroGLYCERIN (NITROSTAT) 0.4 MG SL tablet Place 1 tablet (0.4 mg total) under the tongue every 5 (five) minutes as needed.  . pantoprazole (PROTONIX) 40 MG tablet Take 1 tablet (40 mg total) by mouth daily before breakfast. TAKE 1 TABLET BY MOUTH 30 MINUTES PRIOR TO BREAKFAST AND SUPPER  . sucralfate (CARAFATE) 1 g tablet Please crush 1 gram tablet and mix with water to make it into a slurry. Use 2 times daily for 2 weeks.   . Thiamine HCl (THIAMINE PO) Take 1 tablet by mouth daily.   . [DISCONTINUED] furosemide (LASIX) 20 MG tablet Take 1 tablet (20 mg total) by mouth daily as needed for edema.     Allergies:   Aspirin, Iodinated diagnostic agents, Trazodone and nefazodone, Flagyl [metronidazole], Iodine, and Morphine and related   Social History   Socioeconomic History  . Marital status: Widowed    Spouse name: Not on file  . Number of children: 2  . Years of education: Not on file  . Highest education level: Not on file  Occupational History  . Occupation: RETIRED  Tobacco Use  . Smoking status: Former Smoker    Packs/day: 1.00    Years: 10.00    Pack years: 10.00    Types: Cigarettes    Quit date: 09/22/1985    Years since quitting: 33.8  . Smokeless tobacco: Never Used  Substance and Sexual Activity  . Alcohol use: No    Comment: 09/29/11 "used to drink socially years ago"  . Drug use: No  . Sexual activity: Yes    Comment: 1st intercourse- 15, partners- 66, boyfriend- 1.8 yrs  Other Topics Concern  . Not on file  Social History Narrative   Pt gets regular exercise   Social Determinants of Health   Financial Resource Strain:   . Difficulty of Paying Living Expenses: Not on file  Food Insecurity:   . Worried About Charity fundraiser in the Last Year: Not on file  . Ran Out of Food in the Last Year: Not on file  Transportation Needs:   . Lack of Transportation (Medical): Not on file  . Lack of Transportation (Non-Medical): Not on file  Physical Activity:   . Days of Exercise per Week: Not on file  . Minutes of Exercise per Session: Not on file  Stress:   . Feeling of Stress : Not on file  Social Connections:   . Frequency of Communication with Friends and Family: Not on file  . Frequency of Social Gatherings with Friends and Family: Not on file  . Attends Religious  Services: Not on file  . Active Member of Clubs or Organizations: Not on file  . Attends Archivist  Meetings: Not on file  . Marital Status: Not on file     Family History: The patient's family history includes Breast cancer in her cousin, maternal aunt, paternal aunt, and son; Colon cancer in her paternal uncle; Diabetes in her father and mother; Esophageal cancer in her son; Heart disease in her mother; Hypertension in her father; Kidney disease in her brother and sister. There is no history of Rectal cancer or Stomach cancer.  ROS:   Please see the history of present illness.    All other systems reviewed and are negative.  EKGs/Labs/Other Studies Reviewed:    The following studies were reviewed today:  EKG:  N/A  Recent Labs: 02/07/2019: Pro B Natriuretic peptide (BNP) 115.0 06/16/2019: ALT 19; Hemoglobin 10.4; Magnesium 1.6; Platelets 256.0; TSH 8.00 06/29/2019: BNP 39.9; BUN 21; Creatinine, Ser 1.26; Potassium 3.9; Sodium 138  Recent Lipid Panel    Component Value Date/Time   CHOL 150 03/17/2018 1200   TRIG 112.0 03/17/2018 1200   HDL 75.50 03/17/2018 1200   CHOLHDL 2 03/17/2018 1200   VLDL 22.4 03/17/2018 1200   LDLCALC 52 03/17/2018 1200   LDLDIRECT 53.0 10/29/2015 1338    Physical Exam:    VS:  BP 129/86   Pulse 83   Temp (!) 96.9 F (36.1 C)   Ht 5\' 5"  (1.651 m)   Wt 224 lb 6.4 oz (101.8 kg)   SpO2 99%   BMI 37.34 kg/m     Wt Readings from Last 5 Encounters:  07/04/19 226 lb 12.8 oz (102.9 kg)  06/26/19 224 lb 6.4 oz (101.8 kg)  06/19/19 225 lb 3.2 oz (102.2 kg)  06/16/19 220 lb 9.6 oz (100.1 kg)  05/12/19 218 lb (98.9 kg)     Constitutional: No acute distress Eyes: sclera non-icteric, normal conjunctiva and lids ENMT: normal dentition, moist mucous membranes, neck brace on. Cardiovascular: regular rhythm, normal rate, no murmurs. S1 and S2 normal. Radial pulses normal bilaterally. No jugular venous distention.  Respiratory: clear to auscultation bilaterally GI : normal bowel sounds, soft and nontender. No distention.   MSK: extremities warm, well  perfused. 1+ diffuse edema.  NEURO: grossly nonfocal exam, moves all extremities. PSYCH: alert and oriented x 3, normal mood and affect.   ASSESSMENT:    1. Dyspnea on exertion   2. Dilatation of aortic sinus of Valsalva   3. Bilateral leg edema    PLAN:    DOE and leg swelling-she is endorsing increased dyspnea on exertion over the past several months.  Last echocardiogram performed in 2019.  We need to ensure that systolic and diastolic function have not dramatically changed, and in addition we need to ensure that she does not have worsening aortic regurgitation with her known sinus of Valsalva dilatation.  No significant diastolic murmur on exam today.  I will have her increase her Lasix to 40 mg a day until I see her next week to see if this impacts her shortness of breath.  In the meantime we will obtain an echocardiogram.  She is recently had an ischemic evaluation with a CCTA that shows only mild CAD and no significant lung parenchymal disease noted on limited views of the lungs on that CT scan.  Dilated sinus of Valsalva-has been approximately 49 mm on echo and CT.  Echo going back to 2014 documents a 48 mm sinus measurement.  We  will reevaluate this with echo while we are evaluating systolic and diastolic function.  She is not currently on anything to lower her blood pressure and her blood pressure today is normal.  We should have a low threshold to use losartan or beta-blocker for the benefit of her aortic dilatation, however fortunately it is stable over time.  Hypothyroidism - TSH is 8, adjustments per PCP.   Anemia - may be contributing to dyspnea if no other cause found, continue iron supplementation.    Total time of encounter: 40 minutes total time of encounter, including 30 minutes spent in face-to-face patient care. This time includes coordination of care and counseling regarding above issues. Remainder of non-face-to-face time involved reviewing chart documents/testing  relevant to the patient encounter and documentation in the medical record.  Cherlynn Kaiser, MD [place]  [facility]   Medication Adjustments/Labs and Tests Ordered: Current medicines are reviewed at length with the patient today.  Concerns regarding medicines are outlined above.  Orders Placed This Encounter  Procedures  . Basic metabolic panel  . Brain natriuretic peptide  . ECHOCARDIOGRAM COMPLETE   Meds ordered this encounter  Medications  . furosemide (LASIX) 40 MG tablet    Sig: Take 1 tablet (40 mg total) by mouth daily.    Dispense:  90 tablet    Refill:  3    Dose  And instruction changed    Patient Instructions  Medication Instructions:  Change in  Current medication Lasix ( furosemide)  Increase to 40 mg daily   *If you need a refill on your cardiac medications before your next appointment, please call your pharmacy*  Lab Work: BMP BNP on Monday Jul 03 2019 If you have labs (blood work) drawn today and your tests are completely normal, you will receive your results only by: Marland Kitchen MyChart Message (if you have MyChart) OR . A paper copy in the mail If you have any lab test that is abnormal or we need to change your treatment, we will call you to review the results.  Testing/Procedures: Will be at [facility] has requested that you have an echocardiogram. Echocardiography is a painless test that uses sound waves to create images of your heart. It provides your doctor with information about the size and shape of your heart and how well your heart's chambers and valves are working. This procedure takes approximately one hour. There are no restrictions for this procedure.   Follow-Up: At [facility], you and your health needs are our priority.  As part of our continuing mission to provide you with exceptional heart care, we have created designated Provider Care Teams.  These Care Teams include your primary Cardiologist  (physician) and Advanced Practice Providers (APPs -  Physician Assistants and Nurse Practitioners) who all work together to provide you with the care you need, when you need it.  Your next appointment:   1 week(s)  The format for your next appointment:   In Person  Provider:   Cherlynn Kaiser, MD  Other Instructions

## 2019-06-26 NOTE — Patient Instructions (Addendum)
Medication Instructions:  Change in  Current medication Lasix ( furosemide)  Increase to 40 mg daily   *If you need a refill on your cardiac medications before your next appointment, please call your pharmacy*  Lab Work: BMP BNP on Monday Jul 03 2019 If you have labs (blood work) drawn today and your tests are completely normal, you will receive your results only by: Marland Kitchen MyChart Message (if you have MyChart) OR . A paper copy in the mail If you have any lab test that is abnormal or we need to change your treatment, we will call you to review the results.  Testing/Procedures: Will be at [facility] has requested that you have an echocardiogram. Echocardiography is a painless test that uses sound waves to create images of your heart. It provides your doctor with information about the size and shape of your heart and how well your heart's chambers and valves are working. This procedure takes approximately one hour. There are no restrictions for this procedure.   Follow-Up: At [facility], you and your health needs are our priority.  As part of our continuing mission to provide you with exceptional heart care, we have created designated Provider Care Teams.  These Care Teams include your primary Cardiologist (physician) and Advanced Practice Providers (APPs -  Physician Assistants and Nurse Practitioners) who all work together to provide you with the care you need, when you need it.  Your next appointment:   1 week(s)  The format for your next appointment:   In Person  Provider:   Cherlynn Kaiser, MD  Other Instructions

## 2019-06-28 ENCOUNTER — Encounter: Payer: Self-pay | Admitting: Physical Therapy

## 2019-06-28 ENCOUNTER — Other Ambulatory Visit: Payer: Self-pay

## 2019-06-28 ENCOUNTER — Ambulatory Visit: Payer: Medicare Other | Attending: Family Medicine | Admitting: Physical Therapy

## 2019-06-28 DIAGNOSIS — M542 Cervicalgia: Secondary | ICD-10-CM | POA: Diagnosis not present

## 2019-06-28 DIAGNOSIS — M6281 Muscle weakness (generalized): Secondary | ICD-10-CM | POA: Insufficient documentation

## 2019-06-28 DIAGNOSIS — G8929 Other chronic pain: Secondary | ICD-10-CM | POA: Insufficient documentation

## 2019-06-28 DIAGNOSIS — M25511 Pain in right shoulder: Secondary | ICD-10-CM | POA: Insufficient documentation

## 2019-06-28 NOTE — Therapy (Signed)
[place], [place], [address] Phone: [phone]   Fax:  [phone]  Physical Therapy Evaluation  Patient Details  Name: [name] MRN: [id number] Date of Birth: [birth year] Referring Provider (PT): [name] MD   Encounter Date: 06/28/2019  PT End of Session - 06/28/19 0904    Visit Number  1    Number of Visits  12    Date for PT Re-Evaluation  08/09/19    Authorization Type  MCR    PT Start Time  0845    PT Stop Time  0926    PT Time Calculation (min)  41 min    Activity Tolerance  Patient tolerated treatment well    Behavior During Therapy  [facility] for tasks assessed/performed       Past Medical History:  Diagnosis Date  . Allergy   . ANEMIA, IRON DEFICIENCY 05/08/2009  . Angina   . ASYMPTOMATIC POSTMENOPAUSAL STATUS 10/11/2008  . Blood transfusion   . Blood transfusion without reported diagnosis   . Breast cancer ([place]) 09/29/11   invasive grade III ductal ca,assoc high grade dcis,ER/PR=neg  . C. difficile colitis   . Cataract   . Diverticulosis of colon (without mention of hemorrhage)   . Esophageal reflux 06/12/2008  . Gastroparesis   . GOITER, MULTINODULAR 04/02/2009  . Gout, unspecified   . H/O hiatal hernia   . History of lower GI bleeding   . History of radiation therapy 02/08/12-03/25/12   left breast,total 61gy  . Hypokalemia 05/11/2013  . Hypomagnesemia   . HYPOTHYROIDISM, POST-RADIATION 08/13/2009  . Internal hemorrhoids without mention of complication   . Kidney stones    "several"  . Leukopenia   . Migraines   . Obesity   . Osteoarthrosis, unspecified whether generalized or localized, unspecified site   . Other and unspecified hyperlipidemia   . Personal history of chemotherapy 2013  . Personal history of radiation therapy 2013   left  . PONV (postoperative nausea and vomiting)   . PUD (peptic ulcer disease)   . Short bowel syndrome   . Shortness of breath on  exertion    "sometimes"  . Stricture and stenosis of esophagus   . Thyrotoxicosis without mention of goiter or other cause, without mention of thyrotoxic crisis or storm   . Type II or unspecified type diabetes mellitus without mention of complication, not stated as uncontrolled    no med in years diet controled  . Unspecified essential hypertension   . UTI (urinary tract infection)   . Varicose veins   . VITAMIN B12 DEFICIENCY 08/30/2009    Past Surgical History:  Procedure Laterality Date  . ABDOMINAL ADHESION SURGERY  1980's thru 1990's   "several"  . ABDOMINAL HYSTERECTOMY  1970's   with BSO  . BREAST BIOPSY Left 08/13/11   left breast lower inner quadrant  . BREAST BIOPSY Right 1985   Rt exc bx, benign  . BREAST LUMPECTOMY Left 08/2011  . BREAST LUMPECTOMY W/ NEEDLE LOCALIZATION  09/29/11   left  breast=lymph node,excision benign/ ER/PR=neg, her 2 Positive  . BUNIONECTOMY  1970's   bilateral  . CHOLECYSTECTOMY  1990's  . COLON SURGERY     "several surgeries for short bowel syndrome"  . COLONOSCOPY  2012   multiple   . DILATION AND CURETTAGE OF UTERUS    . ESOPHAGOGASTRODUODENOSCOPY  2011   multiple   . EXCISIONAL HEMORRHOIDECTOMY  11/10/2016  . EYE SURGERY     "long  time ago"  . FLEXIBLE SIGMOIDOSCOPY  2011   multiple   . KIDNEY STONE SURGERY  1990's   "tried to go up & get it but pushed it further up"  . LITHOTRIPSY     "4 or 5 times"  . MASTECTOMY W/ NODES PARTIAL  09/29/11   left  . PORT-A-CATH REMOVAL Right 12/19/2013   Procedure: MINOR REMOVAL PORT-A-CATH;  Surgeon: Adin Hector, MD;  Location: [place];  Service: General;  Laterality: Right;  . PORTACATH PLACEMENT  09/29/2011   Procedure: INSERTION PORT-A-CATH;  Surgeon: Adin Hector, MD;  Location: [place];  Service: General;  Laterality: N/A;  . SMALL INTESTINE SURGERY    . Thyroid Ultrasound  12/1994 and 12/1995  . TOTAL KNEE ARTHROPLASTY Right 06/05/2015   Procedure: RIGHT TOTAL KNEE  ARTHROPLASTY;  Surgeon: Ninetta Lights, MD;  Location: [place];  Service: Orthopedics;  Laterality: Right;  . UPPER GASTROINTESTINAL ENDOSCOPY    . VEIN LIGATION AND STRIPPING  1980's   Right leg    There were no vitals filed for this visit.   Subjective Assessment - 06/28/19 0851    Subjective  Patient has had neck pain for about a year. She has increased pain at night. She has so much pain at times she feels like she can hardly get up. She has histroy of chronic right shoulder pain. She was advised by [facility] that she has arthritis in her shoulder.    How long can you sit comfortably?  N/A    How long can you stand comfortably?  N/A    How long can you walk comfortably?  N/A    Diagnostic tests  X-ray Moderate degenereration moderate at c5-c6 c6-c7    Patient Stated Goals  to have less pain/ sleep better    Currently in Pain?  Yes    Pain Score  7     Pain Location  Neck    Pain Orientation  Left    Pain Descriptors / Indicators  Aching    Pain Type  Chronic pain    Pain Onset  More than a month ago    Pain Frequency  Constant    Aggravating Factors   lying down; turning her head    Pain Relieving Factors  rest    Effect of Pain on Daily Activities  difficulty performeing HEP         [facility] PT Assessment - 06/28/19 0001      Assessment   Medical Diagnosis  Cervicalgia     Referring Provider (PT)  [name] MD    Onset Date/Surgical Date  --   > 6 months    Hand Dominance  Right    Next MD Visit  None scheduled     Prior Therapy  None       Precautions   Precautions  None      Restrictions   Weight Bearing Restrictions  No      Balance Screen   Has the patient fallen in the past 6 months  No    Has the patient had a decrease in activity level because of a fear of falling?   No    Is the patient reluctant to leave their home because of a fear of falling?   No      Home Film/video editor residence      Prior Function   Level of  Independence  Independent    Vocation  Retired    Leisure  nothing       Charity fundraiser Status  Within Functional Limits for tasks assessed    Attention  Focused    Focused Attention  Appears intact    Memory  Appears intact    Awareness  Appears intact    Problem Solving  Appears intact      Observation/Other Assessments   Focus on Therapeutic Outcomes (FOTO)   57% limitation       Sensation   Light Touch  Appears Intact      Coordination   Gross Motor Movements are Fluid and Coordinated  Yes    Fine Motor Movements are Fluid and Coordinated  Yes      ROM / Strength   AROM / PROM / Strength  PROM;AROM;Strength      AROM   AROM Assessment Site  Shoulder;Cervical    Right/Left Shoulder  Right;Left    Right Shoulder Flexion  82 Degrees    Left Shoulder Flexion  88 Degrees   pain in the left side of the neck   Left Shoulder Internal Rotation  --   can not reach behind her back    Left Shoulder External Rotation  --   can not reach behind her head    Cervical Flexion  5    Cervical Extension  10    Cervical - Right Rotation  20    Cervical - Left Rotation  15      Strength   Strength Assessment Site  Shoulder    Right/Left Shoulder  Right;Left    Right Shoulder Flexion  4/5    Right Shoulder Internal Rotation  4/5    Right Shoulder External Rotation  4/5    Left Shoulder Flexion  3/5    Left Shoulder Internal Rotation  3+/5    Left Shoulder External Rotation  3/5      Palpation   Palpation comment  significant tenderness to palpation                 Objective measurements completed on examination: See above findings.      [facility] Adult PT Treatment/Exercise - 06/28/19 0001      Neck Exercises: Seated   Other Seated Exercise  scap retraction x10       Manual Therapy   Manual Therapy  Soft tissue mobilization;Manual Traction    Soft tissue mobilization  STM to upper trap and cervical spine     Manual Traction  to cervical spine low  tolerance with light pressure       Ankle Exercises: Seated   Other Seated Ankle Exercises  --      Neck Exercises: Stretches   Other Neck Stretches  self soft tissue mobilization     Other Neck Stretches  reviewed breathing and relaxation 2x10              PT Education - 06/28/19 0903    Education Details  HEP, symptom management, POC    Person(s) Educated  Patient    Methods  Tactile cues;Demonstration;Explanation;Verbal cues    Comprehension  Verbalized understanding;Returned demonstration;Verbal cues required;Tactile cues required       PT Short Term Goals - 06/28/19 1002      PT SHORT TERM GOAL #1   Title  Patient will increase cervical rotation by 15 degrees bilateral    Time  3    Period  Weeks    Status  New  Target Date  07/19/19      PT SHORT TERM GOAL #2   Title  Patient will increase left shoulder strength to 3+/5    Time  3    Period  Weeks    Status  New    Target Date  07/19/19      PT SHORT TERM GOAL #3   Title  Patient will increased bilateral active shoulder flexion to 110 without increased pain    Time  3    Period  Weeks    Status  New    Target Date  07/19/19        PT Long Term Goals - 06/28/19 1003      PT LONG TERM GOAL #1   Title  Patient will increase bilateral cervical rotation to 55 degrees without pain in order toimprove safety driving.    Time  6    Period  Weeks    Status  New    Target Date  08/09/19      PT LONG TERM GOAL #2   Title  Patient will lift 1 lb weight to shelf with left and right shoulder to perfrom ADL's    Time  6    Period  Weeks    Status  New    Target Date  08/09/19             Plan - 06/28/19 1604    Clinical Impression Statement  Patient is a 74 year old female with cervical spine pain L>R. She has hahhistoryu of right shoulder pain. She has significant spasming and gaurding with palpation to the left upper trpa. She was given self soft tissue mobilization for the left upper trap to  improve PT's ability to perfrom manual therapy. She has significant lmimitations in cervical motion in all planes and bilateral shoulder motion. She would benefit from skilled therapy to improve her ability to sleep, use her UE, and move her neck. She is currently using a C-collar.    Personal Factors and Comorbidities  Comorbidity 1;Comorbidity 2    Comorbidities  angina, left mastectomy    Examination-Activity Limitations  Locomotion Level;Carry;Reach Overhead    Examination-Participation Restrictions  Laundry;Meal Prep;Community Activity    Stability/Clinical Decision Making  Evolving/Moderate complexity    Clinical Decision Making  Moderate    Rehab Potential  Excellent    PT Frequency  2x / week    PT Duration  6 weeks    PT Treatment/Interventions  ADLs/Self Care Home Management;Cryotherapy;Electrical Stimulation;Iontophoresis 4mg /ml Dexamethasone;Moist Heat;Ultrasound;DME Instruction;Gait training;Stair training;Functional mobility training;Therapeutic activities;Therapeutic exercise;Neuromuscular re-education;Patient/family education;Manual techniques;Passive range of motion;Taping;Dry needling    PT Next Visit Plan  pateint was very gaurded with manual therapy. Add light stretching. Will likley need max cuing. She required max cuing for manual muscle testing. add resistance to psoterior chain strengthening if able, consider e-stim; consider heat;    PT Home Exercise Plan  breathing while letting her shoulders down; self soft tissue mobilization; scap retraction    Consulted and Agree with Plan of Care  Patient       Patient will benefit from skilled therapeutic intervention in order to improve the following deficits and impairments:  Decreased endurance, Increased muscle spasms, Decreased range of motion, Decreased activity tolerance, Decreased safety awareness, Increased fascial restricitons, Impaired UE functional use, Pain, Improper body mechanics  Visit  Diagnosis: Cervicalgia  Chronic right shoulder pain  Muscle weakness (generalized)     Problem List Patient Active Problem List   Diagnosis Date Noted  .  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 Dyspnea 02/16/2018  . 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 UTI (urinary tract infection) 11/27/2014  . UTI (lower urinary tract infection) 11/27/2014  . Acute cystitis with hematuria 11/13/2014  . Hemorrhoids, external 11/04/2014  . Wellness examination 10/03/2014  . Pain in joint, shoulder region 09/24/2014  . Cramp in limb 06/26/2014  . Rectal bleeding 05/02/2014  . External hemorrhoids 05/02/2014  . Hemorrhoids without complication [id number]  . Pain in joint, lower leg 02/01/2014  . Vitamin D deficiency 01/16/2014  . Gastroparesis 09/14/2013  . Other dysphagia 09/04/2013  . Nausea alone 09/04/2013  . Iron deficiency anemia 08/28/2013  . Hypomagnesemia 05/11/2013  . Hypokalemia 05/11/2013  . Generalized weakness 05/11/2013  . Dehydration 11/25/2011  . Fatigue 11/16/2011  . Breast cancer ([place]) 09/29/2011  . Family history of breast cancer 08/19/2011  . Primary cancer of lower-inner quadrant of left female breast ([place]) 08/17/2011  . Neck pain 07/29/2011  . Routine general medical examination at a health care facility 06/28/2011  . Edema 11/26/2010  . CLOSTRIDIUM DIFFICILE COLITIS 02/07/2010  . Blind loop syndrome 10/01/2009  . Abdominal pain 10/01/2009  . Vitamin B 12 deficiency 08/30/2009  . 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 Rash and nonspecific skin eruption 08/13/2009  . GOITER, MULTINODULAR 04/02/2009  . CONTACT DERMATITIS&OTHER ECZEMA DUE UNSPEC CAUSE 02/20/2009  . URINARY CALCULUS 10/11/2008  . Asymptomatic menopausal state 10/11/2008  . BACK PAIN, CHRONIC 07/03/2008  . Esophageal reflux 06/12/2008  . Diarrhea 06/12/2008  . ABDOMINAL PAIN-RUQ 05/17/2008  . Diabetes ([place]) 12/10/2006  . Dyslipidemia 12/10/2006  . Gout  12/10/2006  . HYPERTENSION 12/10/2006  . DIVERTICULOSIS, COLON 12/10/2006  . OSTEOARTHRITIS 12/10/2006  . ESOPHAGEAL STRICTURE 08/23/2002  . HIATAL HERNIA 08/23/2002  . INTERNAL HEMORRHOIDS 02/02/2001    Carney Living PT DPT  06/28/2019, 4:30 PM  [place], [place], [address] Phone: [phone]   Fax:  [phone]  Name: [name] MRN: [id number] Date of Birth: [birth year]

## 2019-06-29 ENCOUNTER — Ambulatory Visit (HOSPITAL_COMMUNITY): Payer: Medicare Other | Attending: Cardiology

## 2019-06-29 DIAGNOSIS — Q2544 Congenital dilation of aorta: Secondary | ICD-10-CM | POA: Insufficient documentation

## 2019-06-29 DIAGNOSIS — R0600 Dyspnea, unspecified: Secondary | ICD-10-CM | POA: Diagnosis not present

## 2019-06-29 DIAGNOSIS — R0609 Other forms of dyspnea: Secondary | ICD-10-CM

## 2019-06-29 DIAGNOSIS — R600 Localized edema: Secondary | ICD-10-CM | POA: Diagnosis not present

## 2019-06-29 DIAGNOSIS — Q2549 Other congenital malformations of aorta: Secondary | ICD-10-CM

## 2019-06-30 LAB — BASIC METABOLIC PANEL
BUN/Creatinine Ratio: 17 (ref 12–28)
BUN: 21 mg/dL (ref 8–27)
CO2: 24 mmol/L (ref 20–29)
Calcium: 9.6 mg/dL (ref 8.7–10.3)
Chloride: 98 mmol/L (ref 96–106)
Creatinine, Ser: 1.26 mg/dL — ABNORMAL HIGH (ref 0.57–1.00)
GFR calc Af Amer: 48 mL/min/{1.73_m2} — ABNORMAL LOW (ref >59)
GFR calc non Af Amer: 42 mL/min/{1.73_m2} — ABNORMAL LOW (ref >59)
Glucose: 80 mg/dL (ref 65–99)
Potassium: 3.9 mmol/L (ref 3.5–5.2)
Sodium: 138 mmol/L (ref 134–144)

## 2019-06-30 LAB — BRAIN NATRIURETIC PEPTIDE: BNP: 39.9 pg/mL (ref 0.0–100.0)

## 2019-07-04 ENCOUNTER — Ambulatory Visit (INDEPENDENT_AMBULATORY_CARE_PROVIDER_SITE_OTHER): Payer: Medicare Other | Admitting: Internal Medicine

## 2019-07-04 ENCOUNTER — Encounter: Payer: Self-pay | Admitting: Internal Medicine

## 2019-07-04 ENCOUNTER — Other Ambulatory Visit: Payer: Self-pay

## 2019-07-04 VITALS — BP 147/91 | HR 86 | Temp 97.3°F | Ht 65.0 in | Wt 226.8 lb

## 2019-07-04 DIAGNOSIS — E039 Hypothyroidism, unspecified: Secondary | ICD-10-CM | POA: Diagnosis not present

## 2019-07-04 DIAGNOSIS — R600 Localized edema: Secondary | ICD-10-CM | POA: Diagnosis not present

## 2019-07-04 DIAGNOSIS — R0600 Dyspnea, unspecified: Secondary | ICD-10-CM | POA: Diagnosis not present

## 2019-07-04 DIAGNOSIS — R062 Wheezing: Secondary | ICD-10-CM

## 2019-07-04 DIAGNOSIS — Z853 Personal history of malignant neoplasm of breast: Secondary | ICD-10-CM | POA: Diagnosis not present

## 2019-07-04 DIAGNOSIS — E118 Type 2 diabetes mellitus with unspecified complications: Secondary | ICD-10-CM | POA: Diagnosis not present

## 2019-07-04 DIAGNOSIS — R0602 Shortness of breath: Secondary | ICD-10-CM | POA: Diagnosis not present

## 2019-07-04 DIAGNOSIS — Q2544 Congenital dilation of aorta: Secondary | ICD-10-CM

## 2019-07-04 DIAGNOSIS — Q2549 Other congenital malformations of aorta: Secondary | ICD-10-CM

## 2019-07-04 DIAGNOSIS — R0609 Other forms of dyspnea: Secondary | ICD-10-CM

## 2019-07-04 NOTE — Progress Notes (Signed)
Cardiology Office Note:    Date:  07/04/2019   ID:  [name], DOB 1946/04/26, MRN [id number]  PCP:  Marrian Salvage, [place]  Cardiologist:  No primary care provider on file.  Electrophysiologist:  None   Referring MD: Marrian Salvage,*   Chief Complaint: f/u shortness of breath and leg swelling.   History of Present Illness:    [name] is a 74 y.o. female with a hx of left breast cancer with chemotherapy and radiation, GERD, hypothyroidism, mild CAD by CCTA, and diabetes mellitus type 2 diet controlled.  She has been on 40 mg of oral Lasix since our last visit on 06/26/2019.  She has noticed no change in her dyspnea on exertion but has noticed significant improvement in her lower extremity swelling.  BNP not significantly elevated.  Renal function mildly abnormal in the setting of use of Lasix with creatinine 1.26, continues to make urine appropriately.  Labs will be repeated at her nephrology appointment tomorrow.  Further input from her nephrologist welcome.  She continues to be short of breath over the past few months.  Her echocardiogram shows normal systolic and diastolic function.  Aortic regurgitation is only mild, and sinus of Valsalva measurement has not changed since CCTA 2/10 /2020.  She has not had a pulmonary evaluation and has not had PFTs.  She is a former smoker with an approximate 20-pack-year smoking history, quit in 1980.  She tells me that she occasionally wheezes.  She has not asthmatic and does not take any inhalers.  Continues to deny chest pain or palpitations.  Past Medical History:  Diagnosis Date  . Allergy   . ANEMIA, IRON DEFICIENCY 05/08/2009  . Angina   . ASYMPTOMATIC POSTMENOPAUSAL STATUS 10/11/2008  . Blood transfusion   . Blood transfusion without reported diagnosis   . Breast cancer ([place]) 09/29/11   invasive grade III ductal ca,assoc high grade dcis,ER/PR=neg  . C. difficile colitis   . Cataract   . Diverticulosis of  colon (without mention of hemorrhage)   . Esophageal reflux 06/12/2008  . Gastroparesis   . GOITER, MULTINODULAR 04/02/2009  . Gout, unspecified   . H/O hiatal hernia   . History of lower GI bleeding   . History of radiation therapy 02/08/12-03/25/12   left breast,total 61gy  . Hypokalemia 05/11/2013  . Hypomagnesemia   . HYPOTHYROIDISM, POST-RADIATION 08/13/2009  . Internal hemorrhoids without mention of complication   . Kidney stones    "several"  . Leukopenia   . Migraines   . Obesity   . Osteoarthrosis, unspecified whether generalized or localized, unspecified site   . Other and unspecified hyperlipidemia   . Personal history of chemotherapy 2013  . Personal history of radiation therapy 2013   left  . PONV (postoperative nausea and vomiting)   . PUD (peptic ulcer disease)   . Short bowel syndrome   . Shortness of breath on exertion    "sometimes"  . Stricture and stenosis of esophagus   . Thyrotoxicosis without mention of goiter or other cause, without mention of thyrotoxic crisis or storm   . Type II or unspecified type diabetes mellitus without mention of complication, not stated as uncontrolled    no med in years diet controled  . Unspecified essential hypertension   . UTI (urinary tract infection)   . Varicose veins   . VITAMIN B12 DEFICIENCY 08/30/2009    Past Surgical History:  Procedure Laterality Date  . ABDOMINAL ADHESION SURGERY  1980's thru 1990's   "  several"  . ABDOMINAL HYSTERECTOMY  1970's   with BSO  . BREAST BIOPSY Left 08/13/11   left breast lower inner quadrant  . BREAST BIOPSY Right 1985   Rt exc bx, benign  . BREAST LUMPECTOMY Left 08/2011  . BREAST LUMPECTOMY W/ NEEDLE LOCALIZATION  09/29/11   left  breast=lymph node,excision benign/ ER/PR=neg, her 2 Positive  . BUNIONECTOMY  1970's   bilateral  . CHOLECYSTECTOMY  1990's  . COLON SURGERY     "several surgeries for short bowel syndrome"  . COLONOSCOPY  2012   multiple   . DILATION AND CURETTAGE  OF UTERUS    . ESOPHAGOGASTRODUODENOSCOPY  2011   multiple   . EXCISIONAL HEMORRHOIDECTOMY  11/10/2016  . EYE SURGERY     "long time ago"  . FLEXIBLE SIGMOIDOSCOPY  2011   multiple   . KIDNEY STONE SURGERY  1990's   "tried to go up & get it but pushed it further up"  . LITHOTRIPSY     "4 or 5 times"  . MASTECTOMY W/ NODES PARTIAL  09/29/11   left  . PORT-A-CATH REMOVAL Right 12/19/2013   Procedure: MINOR REMOVAL PORT-A-CATH;  Surgeon: Adin Hector, MD;  Location: [facility];  Service: General;  Laterality: Right;  . PORTACATH PLACEMENT  09/29/2011   Procedure: INSERTION PORT-A-CATH;  Surgeon: Adin Hector, MD;  Location: [facility];  Service: General;  Laterality: N/A;  . SMALL INTESTINE SURGERY    . Thyroid Ultrasound  12/1994 and 12/1995  . TOTAL KNEE ARTHROPLASTY Right 06/05/2015   Procedure: RIGHT TOTAL KNEE ARTHROPLASTY;  Surgeon: Ninetta Lights, MD;  Location: [facility];  Service: Orthopedics;  Laterality: Right;  . UPPER GASTROINTESTINAL ENDOSCOPY    . VEIN LIGATION AND STRIPPING  1980's   Right leg    Current Medications: Current Meds  Medication Sig  . allopurinol (ZYLOPRIM) 100 MG tablet Take 1 tablet (100 mg total) by mouth daily.  Marland Kitchen amitriptyline (ELAVIL) 50 MG tablet Take 1 tablet (50 mg total) by mouth at bedtime.  . calcium carbonate (TUMS - DOSED IN MG ELEMENTAL CALCIUM) 500 MG chewable tablet Chew 1 tablet by mouth as needed for indigestion or heartburn.  . Calcium Carbonate-Vitamin D (CALCIUM-VITAMIN D) 600-200 MG-UNIT CAPS Take 1 capsule by mouth daily.    . Cyanocobalamin (VITAMIN B 12 PO) Take 1 tablet by mouth 2 (two) times daily.   . cyclobenzaprine (FLEXERIL) 5 MG tablet Take 1-2 tablets (5-10 mg total) by mouth 3 (three) times daily as needed for muscle spasms. Take mostly at bedtime.  . dicyclomine (BENTYL) 20 MG tablet Take 1 tablet (20 mg total) by mouth 3 (three) times daily as needed for spasms.  . diphenoxylate-atropine (LOMOTIL) 2.5-0.025  MG tablet Take 1 tablet by mouth 3 times daily as needed for diarrhea for >3-4 stool /day.  . famotidine (PEPCID) 40 MG tablet Take 1 tablet (40 mg total) by mouth at bedtime.  . ferrous sulfate 325 (65 FE) MG tablet Take 325 mg by mouth daily with breakfast.  . fluticasone (FLONASE) 50 MCG/ACT nasal spray Place 2 sprays into both nostrils daily. (Patient taking differently: Place 2 sprays into both nostrils daily as needed. )  . furosemide (LASIX) 40 MG tablet Take 1 tablet (40 mg total) by mouth daily.  Marland Kitchen gentamicin cream (GARAMYCIN) 0.1 % Apply 1 application topically 2 (two) times daily.  Marland Kitchen HYDROcodone-acetaminophen (NORCO/VICODIN) 5-325 MG tablet Take 1 tablet by mouth every 6 (six) hours as needed.  Marland Kitchen  levothyroxine (SYNTHROID) 300 MCG tablet Take 1 tablet (300 mcg total) by mouth daily before breakfast.  . loratadine (CLARITIN) 10 MG tablet Take 1 tablet (10 mg total) by mouth daily.  Marland Kitchen lovastatin (MEVACOR) 20 MG tablet TAKE 1 TABLET BY MOUTH  DAILY AT 6 PM.  . Magnesium 500 MG CAPS Take 1 mg by mouth 2 (two) times daily.   . methocarbamol (ROBAXIN) 500 MG tablet Take 1 tablet (500 mg total) by mouth every 8 (eight) hours as needed for muscle spasms.  . metoCLOPramide (REGLAN) 5 MG tablet Take 1 tablet (5 mg total) by mouth every 8 (eight) hours as needed for nausea.  . nitroGLYCERIN (NITROSTAT) 0.4 MG SL tablet Place 1 tablet (0.4 mg total) under the tongue every 5 (five) minutes as needed.  . pantoprazole (PROTONIX) 40 MG tablet Take 1 tablet (40 mg total) by mouth daily before breakfast. TAKE 1 TABLET BY MOUTH 30 MINUTES PRIOR TO BREAKFAST AND SUPPER  . sucralfate (CARAFATE) 1 g tablet Please crush 1 gram tablet and mix with water to make it into a slurry. Use 2 times daily for 2 weeks.  . Thiamine HCl (THIAMINE PO) Take 1 tablet by mouth daily.      Allergies:   Aspirin, Iodinated diagnostic agents, Trazodone and nefazodone, Flagyl [metronidazole], Iodine, and Morphine and related    Social History   Socioeconomic History  . Marital status: Widowed    Spouse name: Not on file  . Number of children: 2  . Years of education: Not on file  . Highest education level: Not on file  Occupational History  . Occupation: RETIRED  Tobacco Use  . Smoking status: Former Smoker    Packs/day: 1.00    Years: 10.00    Pack years: 10.00    Types: Cigarettes    Quit date: 09/22/1985    Years since quitting: 33.8  . Smokeless tobacco: Never Used  Substance and Sexual Activity  . Alcohol use: No    Comment: 09/29/11 "used to drink socially years ago"  . Drug use: No  . Sexual activity: Yes    Comment: 1st intercourse- 15, partners- 41, boyfriend- 1.8 yrs  Other Topics Concern  . Not on file  Social History Narrative   Pt gets regular exercise   Social Determinants of Health   Financial Resource Strain:   . Difficulty of Paying Living Expenses: Not on file  Food Insecurity:   . Worried About Charity fundraiser in the Last Year: Not on file  . Ran Out of Food in the Last Year: Not on file  Transportation Needs:   . Lack of Transportation (Medical): Not on file  . Lack of Transportation (Non-Medical): Not on file  Physical Activity:   . Days of Exercise per Week: Not on file  . Minutes of Exercise per Session: Not on file  Stress:   . Feeling of Stress : Not on file  Social Connections:   . Frequency of Communication with Friends and Family: Not on file  . Frequency of Social Gatherings with Friends and Family: Not on file  . Attends Religious Services: Not on file  . Active Member of Clubs or Organizations: Not on file  . Attends Archivist Meetings: Not on file  . Marital Status: Not on file     Family History: The patient's family history includes Breast cancer in her cousin, maternal aunt, paternal aunt, and son; Colon cancer in her paternal uncle; Diabetes in her father and  mother; Esophageal cancer in her son; Heart disease in her mother; Hypertension  in her father; Kidney disease in her brother and sister. There is no history of Rectal cancer or Stomach cancer.  ROS:   Please see the history of present illness.    All other systems reviewed and are negative.  EKGs/Labs/Other Studies Reviewed:    The following studies were reviewed today:  EKG:  n/a  Recent Labs: 02/07/2019: Pro B Natriuretic peptide (BNP) 115.0 06/16/2019: ALT 19; Hemoglobin 10.4; Magnesium 1.6; Platelets 256.0; TSH 8.00 06/29/2019: BNP 39.9; BUN 21; Creatinine, Ser 1.26; Potassium 3.9; Sodium 138  Recent Lipid Panel    Component Value Date/Time   CHOL 150 03/17/2018 1200   TRIG 112.0 03/17/2018 1200   HDL 75.50 03/17/2018 1200   CHOLHDL 2 03/17/2018 1200   VLDL 22.4 03/17/2018 1200   LDLCALC 52 03/17/2018 1200   LDLDIRECT 53.0 10/29/2015 1338    Physical Exam:    VS:  BP (!) 147/91   Pulse 86   Temp (!) 97.3 F (36.3 C)   Ht 5\' 5"  (1.651 m)   Wt 226 lb 12.8 oz (102.9 kg)   SpO2 98%   BMI 37.74 kg/m     Wt Readings from Last 5 Encounters:  07/04/19 226 lb 12.8 oz (102.9 kg)  06/26/19 224 lb 6.4 oz (101.8 kg)  06/19/19 225 lb 3.2 oz (102.2 kg)  06/16/19 220 lb 9.6 oz (100.1 kg)  05/12/19 218 lb (98.9 kg)     Constitutional: No acute distress Eyes: sclera non-icteric, normal conjunctiva and lids ENMT: normal dentition, moist mucous membranes Cardiovascular: regular rhythm, normal rate, no murmurs. S1 and S2 normal. Radial pulses normal bilaterally. No jugular venous distention.  Respiratory: clear to auscultation bilaterally GI : normal bowel sounds, soft and nontender. No distention.   MSK: extremities warm, well perfused.  Trace edema.  NEURO: grossly nonfocal exam, moves all extremities. PSYCH: alert and oriented x 3, normal mood and affect.   ASSESSMENT:    1. Shortness of breath   2. Wheezing   3. Dilatation of aortic sinus of Valsalva   4. Dyspnea on exertion   5. Bilateral leg edema   6. History of breast cancer   7.  Hypothyroidism, unspecified type   8. Type 2 diabetes mellitus with complication, without long-term current use of insulin (HCC)    PLAN:    Dyspnea on exertion-patient tells me that she occasionally wheezes, has a history of smoking, and has continued shortness of breath particularly with activity.  She has had a negative ischemic work-up with nonobstructive CAD on a CCTA.  She has had a recent echocardiogram without significant systolic or diastolic dysfunction.  No definite findings to suggest pericardial constriction as the source of her dyspnea on echo.  With her smoking history I think it would be best if she is next evaluated by pulmonary medicine.  If PFTs and pulmonary evaluation are unremarkable, we will circle back for a more in-depth cardiovascular evaluation, however we have done quite a few tests at this point all with relatively reassuring results from the standpoint of shortness of breath.  Lower extremity swelling-resolved with increasing Lasix to 40 mg daily.  We will continue this dose for now.  She will have labs with her nephrologist tomorrow.   Chronic kidney disease-followed by nephrology.  Not currently on antihypertensive therapy  Elevated blood pressure reading-today's blood pressure is mildly elevated which may be technique driven.  At our last visit it was relatively  normal.  She is not currently on antihypertensive therapy.  With her dilated sinuses of Valsalva it will be critical to manage any hypertension that is above the goal of 120/80.  With her renal disease cautious initiation of losartan can be considered as this is optimal for preventing further dilatation of the aorta.  Metoprolol can be considered as well.  If these are not helpful in controlling blood pressure, certainly amlodipine 5 mg daily could be considered.  I discussed all this with the patient and she understands that if she has elevated readings at other physicians offices she should contact me and we can  discuss this further.  Dilated ascending aorta at the level of the sinuses of Valsalva-49 mm in maximal dimension measured over the past several years.  Only mild aortic valve regurgitation on echo.  This will need to be followed annually with echocardiography, if any change will obtain CTA aorta.  Total time of encounter: 40 minutes total time of encounter, including 30 minutes spent in face-to-face patient care. This time includes coordination of care and counseling regarding above issues. Remainder of non-face-to-face time involved reviewing chart documents/testing relevant to the patient encounter and documentation in the medical record.  Cherlynn Kaiser, MD [place]  [facility]   Medication Adjustments/Labs and Tests Ordered: Current medicines are reviewed at length with the patient today.  Concerns regarding medicines are outlined above.  Orders Placed This Encounter  Procedures  . Ambulatory referral to Pulmonology   No orders of the defined types were placed in this encounter.   Patient Instructions  Medication Instructions:  Continue with current medications  Especially Lasix  (furosemide) 40 mg daily   *If you need a refill on your cardiac medications before your next appointment, please call your pharmacy*  Lab Work: Not needed   Testing/Procedures: Not needed  Follow-Up: At [facility], you and your health needs are our priority.  As part of our continuing mission to provide you with exceptional heart care, we have created designated Provider Care Teams.  These Care Teams include your primary Cardiologist (physician) and Advanced Practice Providers (APPs -  Physician Assistants and Nurse Practitioners) who all work together to provide you with the care you need, when you need it.  Your next appointment:   2 to 3 month(s)  The format for your next appointment:   In Person  Provider:   Cherlynn Kaiser, MD  Other Instructions You have been referred  to  PULMONARY doctor - for shortness of breath and  wheezing Please check your blood pressure at home at least 3 times a week and record the readings and bring with you to the next appointment.   purchase  Some support / compression knee-hi socks or hose ( strength-8-15 mmHG can go up to 20 mmHG ) May obtain them from   Pharmacy , department store  Like [employer] ,[employer] ,[employer]

## 2019-07-04 NOTE — Patient Instructions (Addendum)
Medication Instructions:  Continue with current medications  Especially Lasix  (furosemide) 40 mg daily   *If you need a refill on your cardiac medications before your next appointment, please call your pharmacy*  Lab Work: Not needed   Testing/Procedures: Not needed  Follow-Up: At [facility], you and your health needs are our priority.  As part of our continuing mission to provide you with exceptional heart care, we have created designated Provider Care Teams.  These Care Teams include your primary Cardiologist (physician) and Advanced Practice Providers (APPs -  Physician Assistants and Nurse Practitioners) who all work together to provide you with the care you need, when you need it.  Your next appointment:   2 to 3 month(s)  The format for your next appointment:   In Person  Provider:   Cherlynn Kaiser, MD  Other Instructions You have been referred to  PULMONARY doctor - for shortness of breath and  wheezing Please check your blood pressure at home at least 3 times a week and record the readings and bring with you to the next appointment.   purchase  Some support / compression knee-hi socks or hose ( strength-8-15 mmHG can go up to 20 mmHG ) May obtain them from   Pharmacy , department store  Like [employer] ,[employer] ,[place]

## 2019-07-05 DIAGNOSIS — N189 Chronic kidney disease, unspecified: Secondary | ICD-10-CM | POA: Diagnosis not present

## 2019-07-05 DIAGNOSIS — N182 Chronic kidney disease, stage 2 (mild): Secondary | ICD-10-CM | POA: Diagnosis not present

## 2019-07-05 DIAGNOSIS — D631 Anemia in chronic kidney disease: Secondary | ICD-10-CM | POA: Diagnosis not present

## 2019-07-05 DIAGNOSIS — I129 Hypertensive chronic kidney disease with stage 1 through stage 4 chronic kidney disease, or unspecified chronic kidney disease: Secondary | ICD-10-CM | POA: Diagnosis not present

## 2019-07-06 ENCOUNTER — Ambulatory Visit (INDEPENDENT_AMBULATORY_CARE_PROVIDER_SITE_OTHER): Payer: Medicare Other | Admitting: Internal Medicine

## 2019-07-06 ENCOUNTER — Encounter: Payer: Self-pay | Admitting: Internal Medicine

## 2019-07-06 ENCOUNTER — Other Ambulatory Visit: Payer: Self-pay

## 2019-07-06 DIAGNOSIS — R0600 Dyspnea, unspecified: Secondary | ICD-10-CM | POA: Diagnosis not present

## 2019-07-06 DIAGNOSIS — K219 Gastro-esophageal reflux disease without esophagitis: Secondary | ICD-10-CM

## 2019-07-06 DIAGNOSIS — R0609 Other forms of dyspnea: Secondary | ICD-10-CM

## 2019-07-06 MED ORDER — TRELEGY ELLIPTA 100-62.5-25 MCG/INH IN AEPB: 1.0000 | INHALATION_SPRAY | Freq: Every day | RESPIRATORY_TRACT | 0 refills | Status: DC

## 2019-07-06 NOTE — Patient Instructions (Addendum)
Order-  6 minute walk DX Dyspnea on exertion   Order- schedule PFT  Sample x 1 Trelegy 100 inhaler    Inhale 1 puff, once daily   See if this helps your shortness of breath  Please call if we can help

## 2019-07-06 NOTE — Progress Notes (Signed)
07/06/19- 26 yoF former smoker ( 20 pk yr, quit 1980) referred for shortness of breath by Dr Acharya/ Cardiology for eval of Dyspnea and wheezing Medical problem list includes HTN, Hiatal Hernia, GERD/ stricture, Gatroparesis, Hx C.diff colitis, Goiter/ Hypothyroid p RAI, DM2, Eczema, Breast Cancer L/ chemo/ XRT, FE def Anemia, Renal Insuficiency,  ECHO- nl w mild AR,  Negative ischemia w/u w non-obstructive CAD on CCTA,  ------SOB for 2 months off and on .  Arrival room air sat 99% Has had flu vax and 1 Covid vax More aware of dyspnea on exertion x 2-3 months walking, dressing, stairs. Occ a little wheeze, not much cough,no sputum. No acute illness associated.  Some seasonal rhinitis, spring and fall.  No hx pneumonia. Had XRT breast 2013.  Aware she snores, lives alone. Rarely wakes SOB, relieved by sitting up. Has adjustable bed, 2 pillows. Some daytime tiredness, not limiting her.  Worked at [employer]  Weight up and down x years. Recognizes reflux/ heart burn which has awakened her and choked her at night.  CXR 02/07/2019-  Cardiac shadows within normal limits. Aortic calcifications are noted. The lungs are clear bilaterally. No acute bony abnormality is seen. IMPRESSION: No acute abnormality noted.  Prior to Admission medications   Medication Sig Start Date End Date Taking? Authorizing Provider  allopurinol (ZYLOPRIM) 100 MG tablet Take 1 tablet (100 mg total) by mouth daily. 06/15/19  Yes Marrian Salvage, FNP  amitriptyline (ELAVIL) 50 MG tablet Take 1 tablet (50 mg total) by mouth at bedtime. 06/15/19  Yes Marrian Salvage, FNP  calcium carbonate (TUMS - DOSED IN MG ELEMENTAL CALCIUM) 500 MG chewable tablet Chew 1 tablet by mouth as needed for indigestion or heartburn.   Yes [provider]  Calcium Carbonate-Vitamin D (CALCIUM-VITAMIN D) 600-200 MG-UNIT CAPS Take 1 capsule by mouth daily.     Yes [provider]  Cyanocobalamin (VITAMIN B 12 PO) Take 1 tablet by  mouth 2 (two) times daily.    Yes [provider]  cyclobenzaprine (FLEXERIL) 5 MG tablet Take 1-2 tablets (5-10 mg total) by mouth 3 (three) times daily as needed for muscle spasms. Take mostly at bedtime. 06/19/19  Yes Gregor Hams, MD  dicyclomine (BENTYL) 20 MG tablet Take 1 tablet (20 mg total) by mouth 3 (three) times daily as needed for spasms. 02/14/19  Yes Lavina Hamman, MD  diphenoxylate-atropine (LOMOTIL) 2.5-0.025 MG tablet Take 1 tablet by mouth 3 times daily as needed for diarrhea for >3-4 stool /day. 02/14/19  Yes Lavina Hamman, MD  famotidine (PEPCID) 40 MG tablet Take 1 tablet (40 mg total) by mouth at bedtime. 05/12/19  Yes Ladene Artist, MD  ferrous sulfate 325 (65 FE) MG tablet Take 325 mg by mouth daily with breakfast.   Yes [provider]  fluticasone (FLONASE) 50 MCG/ACT nasal spray Place 2 sprays into both nostrils daily. Patient taking differently: Place 2 sprays into both nostrils daily as needed.  12/27/18  Yes Marrian Salvage, FNP  furosemide (LASIX) 40 MG tablet Take 1 tablet (40 mg total) by mouth daily. 06/26/19 09/24/19 Yes Elouise Munroe, MD  gentamicin cream (GARAMYCIN) 0.1 % Apply 1 application topically 2 (two) times daily. 05/31/19  Yes Edrick Kins, DPM  HYDROcodone-acetaminophen (NORCO/VICODIN) 5-325 MG tablet Take 1 tablet by mouth every 6 (six) hours as needed. 06/23/19  Yes Gregor Hams, MD  levothyroxine (SYNTHROID) 300 MCG tablet Take 1 tablet (300 mcg total) by mouth daily  before breakfast. 06/19/19  Yes Marrian Salvage, FNP  loratadine (CLARITIN) 10 MG tablet Take 1 tablet (10 mg total) by mouth daily. 12/27/18  Yes Marrian Salvage, FNP  lovastatin (MEVACOR) 20 MG tablet TAKE 1 TABLET BY MOUTH  DAILY AT 6 PM. 03/27/19  Yes Marrian Salvage, FNP  Magnesium 500 MG CAPS Take 1 mg by mouth 2 (two) times daily.    Yes [provider]  methocarbamol (ROBAXIN) 500 MG tablet Take 1 tablet (500 mg total)  by mouth every 8 (eight) hours as needed for muscle spasms. 05/29/19  Yes Janith Lima, MD  metoCLOPramide (REGLAN) 5 MG tablet Take 1 tablet (5 mg total) by mouth every 8 (eight) hours as needed for nausea. 05/29/19  Yes Janith Lima, MD  nitroGLYCERIN (NITROSTAT) 0.4 MG SL tablet Place 1 tablet (0.4 mg total) under the tongue every 5 (five) minutes as needed. 12/27/18  Yes Elouise Munroe, MD  pantoprazole (PROTONIX) 40 MG tablet Take 1 tablet (40 mg total) by mouth daily before breakfast. TAKE 1 TABLET BY MOUTH 30 MINUTES PRIOR TO BREAKFAST AND SUPPER 05/29/19  Yes Janith Lima, MD  sucralfate (CARAFATE) 1 g tablet Please crush 1 gram tablet and mix with water to make it into a slurry. Use 2 times daily for 2 weeks. 05/01/19  Yes Noralyn Pick, NP  Thiamine HCl (THIAMINE PO) Take 1 tablet by mouth daily.    Yes [provider]  Fluticasone-Umeclidin-Vilant (TRELEGY ELLIPTA) 100-62.5-25 MCG/INH AEPB Inhale 1 puff into the lungs daily. 07/06/19   Deneise Lever, MD   Past Medical History:  Diagnosis Date  . Allergy   . ANEMIA, IRON DEFICIENCY 05/08/2009  . Angina   . ASYMPTOMATIC POSTMENOPAUSAL STATUS 10/11/2008  . Blood transfusion   . Blood transfusion without reported diagnosis   . Breast cancer ([place]) 09/29/11   invasive grade III ductal ca,assoc high grade dcis,ER/PR=neg  . C. difficile colitis   . Cataract   . Diverticulosis of colon (without mention of hemorrhage)   . Esophageal reflux 06/12/2008  . Gastroparesis   . GOITER, MULTINODULAR 04/02/2009  . Gout, unspecified   . H/O hiatal hernia   . History of lower GI bleeding   . History of radiation therapy 02/08/12-03/25/12   left breast,total 61gy  . Hypokalemia 05/11/2013  . Hypomagnesemia   . HYPOTHYROIDISM, POST-RADIATION 08/13/2009  . Internal hemorrhoids without mention of complication   . Kidney stones    "several"  . Leukopenia   . Migraines   . Obesity   . Osteoarthrosis, unspecified whether  generalized or localized, unspecified site   . Other and unspecified hyperlipidemia   . Personal history of chemotherapy 2013  . Personal history of radiation therapy 2013   left  . PONV (postoperative nausea and vomiting)   . PUD (peptic ulcer disease)   . Short bowel syndrome   . Shortness of breath on exertion    "sometimes"  . Stricture and stenosis of esophagus   . Thyrotoxicosis without mention of goiter or other cause, without mention of thyrotoxic crisis or storm   . Type II or unspecified type diabetes mellitus without mention of complication, not stated as uncontrolled    no med in years diet controled  . Unspecified essential hypertension   . UTI (urinary tract infection)   . Varicose veins   . VITAMIN B12 DEFICIENCY 08/30/2009   Past Surgical History:  Procedure Laterality Date  . ABDOMINAL ADHESION SURGERY  1980's thru  1990's   "several"  . ABDOMINAL HYSTERECTOMY  1970's   with BSO  . BREAST BIOPSY Left 08/13/11   left breast lower inner quadrant  . BREAST BIOPSY Right 1985   Rt exc bx, benign  . BREAST LUMPECTOMY Left 08/2011  . BREAST LUMPECTOMY W/ NEEDLE LOCALIZATION  09/29/11   left  breast=lymph node,excision benign/ ER/PR=neg, her 2 Positive  . BUNIONECTOMY  1970's   bilateral  . CHOLECYSTECTOMY  1990's  . COLON SURGERY     "several surgeries for short bowel syndrome"  . COLONOSCOPY  2012   multiple   . DILATION AND CURETTAGE OF UTERUS    . ESOPHAGOGASTRODUODENOSCOPY  2011   multiple   . EXCISIONAL HEMORRHOIDECTOMY  11/10/2016  . EYE SURGERY     "long time ago"  . FLEXIBLE SIGMOIDOSCOPY  2011   multiple   . KIDNEY STONE SURGERY  1990's   "tried to go up & get it but pushed it further up"  . LITHOTRIPSY     "4 or 5 times"  . MASTECTOMY W/ NODES PARTIAL  09/29/11   left  . PORT-A-CATH REMOVAL Right 12/19/2013   Procedure: MINOR REMOVAL PORT-A-CATH;  Surgeon: Adin Hector, MD;  Location: [facility];  Service: General;  Laterality:  Right;  . PORTACATH PLACEMENT  09/29/2011   Procedure: INSERTION PORT-A-CATH;  Surgeon: Adin Hector, MD;  Location: [facility];  Service: General;  Laterality: N/A;  . SMALL INTESTINE SURGERY    . Thyroid Ultrasound  12/1994 and 12/1995  . TOTAL KNEE ARTHROPLASTY Right 06/05/2015   Procedure: RIGHT TOTAL KNEE ARTHROPLASTY;  Surgeon: Ninetta Lights, MD;  Location: [facility];  Service: Orthopedics;  Laterality: Right;  . UPPER GASTROINTESTINAL ENDOSCOPY    . VEIN LIGATION AND STRIPPING  1980's   Right leg   Family History  Problem Relation Age of Onset  . Esophageal cancer Son        deceased  . Breast cancer Son        esophageal  . Diabetes Mother   . Heart disease Mother   . Kidney disease Sister   . Diabetes Father   . Hypertension Father   . Kidney disease Brother        x 3  . Colon cancer Paternal Uncle   . Breast cancer Maternal Aunt   . Breast cancer Paternal Aunt   . Breast cancer Cousin        Pt states she has 15+ cousins w/ Breast CA  . Rectal cancer Neg Hx   . Stomach cancer Neg Hx    Social History   Socioeconomic History  . Marital status: Widowed    Spouse name: Not on file  . Number of children: 2  . Years of education: Not on file  . Highest education level: Not on file  Occupational History  . Occupation: RETIRED  Tobacco Use  . Smoking status: Former Smoker    Packs/day: 1.00    Years: 10.00    Pack years: 10.00    Types: Cigarettes    Quit date: 09/22/1985    Years since quitting: 33.8  . Smokeless tobacco: Never Used  Substance and Sexual Activity  . Alcohol use: No    Comment: 09/29/11 "used to drink socially years ago"  . Drug use: No  . Sexual activity: Yes    Comment: 1st intercourse- 15, partners- 26, boyfriend- 1.8 yrs  Other Topics Concern  . Not on file  Social History Narrative  Pt gets regular exercise   Social Determinants of Health   Financial Resource Strain:   . Difficulty of Paying Living Expenses: Not on file  Food  Insecurity:   . Worried About Charity fundraiser in the Last Year: Not on file  . Ran Out of Food in the Last Year: Not on file  Transportation Needs:   . Lack of Transportation (Medical): Not on file  . Lack of Transportation (Non-Medical): Not on file  Physical Activity:   . Days of Exercise per Week: Not on file  . Minutes of Exercise per Session: Not on file  Stress:   . Feeling of Stress : Not on file  Social Connections:   . Frequency of Communication with Friends and Family: Not on file  . Frequency of Social Gatherings with Friends and Family: Not on file  . Attends Religious Services: Not on file  . Active Member of Clubs or Organizations: Not on file  . Attends Archivist Meetings: Not on file  . Marital Status: Not on file  Intimate Partner Violence:   . Fear of Current or Ex-Partner: Not on file  . Emotionally Abused: Not on file  . Physically Abused: Not on file  . Sexually Abused: Not on file   ROS-see HPI   + = positive Constitutional:    weight loss, night sweats, fevers, chills, +fatigue, lassitude. HEENT:    headaches, difficulty swallowing, tooth/dental problems, sore throat,       sneezing, itching, ear ache, nasal congestion, post nasal drip, snoring CV:    chest pain, orthopnea, PND, +swelling in lower extremities, anasarca,                                  dizziness, palpitations Resp:  + shortness of breath with exertion or at rest.                productive cough,   non-productive cough, coughing up of blood.              change in color of mucus.  wheezing.   Skin:    rash or lesions. GI:  No-   heartburn, indigestion, abdominal pain, nausea, vomiting, diarrhea,                 change in bowel habits, loss of appetite GU: dysuria, change in color of urine, no urgency or frequency.   flank pain. MS:   joint pain, stiffness, decreased range of motion, back pain. Neuro-     nothing unusual Psych:  change in mood or affect.  depression or  anxiety.   memory loss.  OBJ- Physical Exam General- Alert, Oriented, Affect-appropriate, Distress- none acute, + obese Skin- rash-none, lesions- none, excoriation- none Lymphadenopathy- none Head- atraumatic            Eyes- Gross vision intact, PERRLA, conjunctivae and secretions clear            Ears- Hearing, canals-normal            Nose- Clear, no-Septal dev, mucus, polyps, erosion, perforation             Throat- Mallampati III-IV , mucosa clear , drainage- none, tonsils- atrophic, + teeth Neck- flexible , trachea midline, no stridor , thyroid nl, carotid no bruit Chest - symmetrical excursion , unlabored           Heart/CV- RRR , no murmur ,  no gallop  , no rub, nl s1 s2                           -JVD+1cm , edema- none, stasis changes- none, varices- none           Lung- clear to P&A, wheeze- none, cough- none , dullness-none, rub- none           Chest wall-  Abd-  Br/ Gen/ Rectal- Not done, not indicated Extrem- cyanosis- none, clubbing, none, atrophy- none, strength- nl, + compression stockings Neuro- grossly intact to observation

## 2019-07-07 ENCOUNTER — Ambulatory Visit: Payer: Medicare Other | Attending: Family Medicine | Admitting: Physical Therapy

## 2019-07-07 ENCOUNTER — Encounter: Payer: Self-pay | Admitting: Physical Therapy

## 2019-07-07 DIAGNOSIS — G8929 Other chronic pain: Secondary | ICD-10-CM | POA: Insufficient documentation

## 2019-07-07 DIAGNOSIS — M6281 Muscle weakness (generalized): Secondary | ICD-10-CM | POA: Insufficient documentation

## 2019-07-07 DIAGNOSIS — M542 Cervicalgia: Secondary | ICD-10-CM | POA: Diagnosis not present

## 2019-07-07 DIAGNOSIS — M25511 Pain in right shoulder: Secondary | ICD-10-CM | POA: Insufficient documentation

## 2019-07-07 NOTE — Therapy (Signed)
[place], [place], [address] Phone: [phone]   Fax:  [phone]  Physical Therapy Treatment  Patient Details  Name: BEREA NOGUERAS MRN: [id number] Date of Birth: 1946/03/31 Referring Provider (PT): [name] MD   Encounter Date: 07/07/2019  PT End of Session - 07/07/19 0827    Visit Number  2    Number of Visits  12    Date for PT Re-Evaluation  08/09/19    Authorization Type  MCR    PT Start Time  0800    PT Stop Time  0855    PT Time Calculation (min)  55 min    Activity Tolerance  Patient tolerated treatment well    Behavior During Therapy  [facility] for tasks assessed/performed       Past Medical History:  Diagnosis Date  . Allergy   . ANEMIA, IRON DEFICIENCY 05/08/2009  . Angina   . ASYMPTOMATIC POSTMENOPAUSAL STATUS 10/11/2008  . Blood transfusion   . Blood transfusion without reported diagnosis   . Breast cancer ([place]) 09/29/11   invasive grade III ductal ca,assoc high grade dcis,ER/PR=neg  . C. difficile colitis   . Cataract   . Diverticulosis of colon (without mention of hemorrhage)   . Esophageal reflux 06/12/2008  . Gastroparesis   . GOITER, MULTINODULAR 04/02/2009  . Gout, unspecified   . H/O hiatal hernia   . History of lower GI bleeding   . History of radiation therapy 02/08/12-03/25/12   left breast,total 61gy  . Hypokalemia 05/11/2013  . Hypomagnesemia   . HYPOTHYROIDISM, POST-RADIATION 08/13/2009  . Internal hemorrhoids without mention of complication   . Kidney stones    "several"  . Leukopenia   . Migraines   . Obesity   . Osteoarthrosis, unspecified whether generalized or localized, unspecified site   . Other and unspecified hyperlipidemia   . Personal history of chemotherapy 2013  . Personal history of radiation therapy 2013   left  . PONV (postoperative nausea and vomiting)   . PUD (peptic ulcer disease)   . Short bowel syndrome   . Shortness of breath on  exertion    "sometimes"  . Stricture and stenosis of esophagus   . Thyrotoxicosis without mention of goiter or other cause, without mention of thyrotoxic crisis or storm   . Type II or unspecified type diabetes mellitus without mention of complication, not stated as uncontrolled    no med in years diet controled  . Unspecified essential hypertension   . UTI (urinary tract infection)   . Varicose veins   . VITAMIN B12 DEFICIENCY 08/30/2009    Past Surgical History:  Procedure Laterality Date  . ABDOMINAL ADHESION SURGERY  1980's thru 1990's   "several"  . ABDOMINAL HYSTERECTOMY  1970's   with BSO  . BREAST BIOPSY Left 08/13/11   left breast lower inner quadrant  . BREAST BIOPSY Right 1985   Rt exc bx, benign  . BREAST LUMPECTOMY Left 08/2011  . BREAST LUMPECTOMY W/ NEEDLE LOCALIZATION  09/29/11   left  breast=lymph node,excision benign/ ER/PR=neg, her 2 Positive  . BUNIONECTOMY  1970's   bilateral  . CHOLECYSTECTOMY  1990's  . COLON SURGERY     "several surgeries for short bowel syndrome"  . COLONOSCOPY  2012   multiple   . DILATION AND CURETTAGE OF UTERUS    . ESOPHAGOGASTRODUODENOSCOPY  2011   multiple   . EXCISIONAL HEMORRHOIDECTOMY  11/10/2016  . EYE SURGERY     "long  time ago"  . FLEXIBLE SIGMOIDOSCOPY  2011   multiple   . KIDNEY STONE SURGERY  1990's   "tried to go up & get it but pushed it further up"  . LITHOTRIPSY     "4 or 5 times"  . MASTECTOMY W/ NODES PARTIAL  09/29/11   left  . PORT-A-CATH REMOVAL Right 12/19/2013   Procedure: MINOR REMOVAL PORT-A-CATH;  Surgeon: Adin Hector, MD;  Location: [place];  Service: General;  Laterality: Right;  . PORTACATH PLACEMENT  09/29/2011   Procedure: INSERTION PORT-A-CATH;  Surgeon: Adin Hector, MD;  Location: [place];  Service: General;  Laterality: N/A;  . SMALL INTESTINE SURGERY    . Thyroid Ultrasound  12/1994 and 12/1995  . TOTAL KNEE ARTHROPLASTY Right 06/05/2015   Procedure: RIGHT TOTAL KNEE  ARTHROPLASTY;  Surgeon: Ninetta Lights, MD;  Location: [place];  Service: Orthopedics;  Laterality: Right;  . UPPER GASTROINTESTINAL ENDOSCOPY    . VEIN LIGATION AND STRIPPING  1980's   Right leg    There were no vitals filed for this visit.  Subjective Assessment - 07/07/19 0803    Subjective  Patient reports her shoulder has been better. She is having less pain at night. She is not having pain this morning.    How long can you sit comfortably?  N/A    How long can you stand comfortably?  N/A    How long can you walk comfortably?  N/A    Diagnostic tests  X-ray Moderate degenereration moderate at c5-c6 c6-c7    Patient Stated Goals  to have less pain/ sleep better    Currently in Pain?  No/denies                       [facility] Adult PT Treatment/Exercise - 07/07/19 0001      Neck Exercises: Standing   Other Standing Exercises  scap retraction yellow mod vc's for technique and securing in the door 2x10 yellow; shoulder extension min vc's for technique       Neck Exercises: Seated   Other Seated Exercise  scap retraction x10       Neck Exercises: Supine   Other Supine Exercise  wand flexion x10 in pain free ranges       Modalities   Modalities  Moist Heat;Electrical Stimulation      Moist Heat Therapy   Number Minutes Moist Heat  10 Minutes    Moist Heat Location  Cervical      Electrical Stimulation   Electrical Stimulation Location  IFC    Electrical Stimulation Action  reduce pain     Electrical Stimulation Parameters  to tolerance     Electrical Stimulation Goals  Pain      Manual Therapy   Manual Therapy  Soft tissue mobilization;Manual Traction    Soft tissue mobilization  STM to upper trap and cervical spine; attmepted IASTYM but patient had some pain. continues to have low tolerance to light touch     Manual Traction  to cervical spine low tolerance with light pressure       Neck Exercises: Stretches   Upper Trapezius Stretch  2 reps;20 seconds     Levator Stretch  2 reps;20 seconds    Other Neck Stretches  reviewed breathing and relaxation              PT Education - 07/07/19 0804    Education Details  reviewed HEp and symptom mangement  Person(s) Educated  Patient    Methods  Explanation;Demonstration;Tactile cues;Verbal cues    Comprehension  Verbalized understanding;Returned demonstration;Verbal cues required;Tactile cues required       PT Short Term Goals - 07/07/19 1358      PT SHORT TERM GOAL #1   Title  Patient will increase cervical rotation by 15 degrees bilateral    Time  3    Period  Weeks    Status  On-going    Target Date  07/19/19      PT SHORT TERM GOAL #2   Title  Patient will increase left shoulder strength to 3+/5    Time  3    Period  Weeks    Status  On-going    Target Date  07/19/19      PT SHORT TERM GOAL #3   Title  Patient will increased bilateral active shoulder flexion to 110 without increased pain    Time  3    Period  Weeks    Status  On-going    Target Date  07/19/19        PT Long Term Goals - 06/28/19 1003      PT LONG TERM GOAL #1   Title  Patient will increase bilateral cervical rotation to 55 degrees without pain in order toimprove safety driving.    Time  6    Period  Weeks    Status  New    Target Date  08/09/19      PT LONG TERM GOAL #2   Title  Patient will lift 1 lb weight to shelf with left and right shoulder to perfrom ADL's    Time  6    Period  Weeks    Status  New    Target Date  08/09/19            Plan - 07/07/19 1355    Clinical Impression Statement  Depsite reports of no pain this morning the patient had significant sensativity to light touch and soft titssue mobilization. She was advised to continue with slef soft tissue mobilization at home. She tolerated ther-ex well but will require further training for technique with ther-ex. She was given updated exercises for home but again emphasized letting shoulders relax as a main focus. Therapy  trialed e-stim to decrease pain and sensativity to light touch. She does not have another appointment for 2 weeks. it is unclear if it was just a scheduling issue. She reports that she wants to come then.    Personal Factors and Comorbidities  Comorbidity 1;Comorbidity 2    Comorbidities  angina, left mastectomy    Examination-Activity Limitations  Locomotion Level;Carry;Reach Overhead    Examination-Participation Restrictions  Laundry;Meal Prep;Community Activity    Stability/Clinical Decision Making  Evolving/Moderate complexity    Clinical Decision Making  Moderate    PT Frequency  2x / week    PT Duration  6 weeks    PT Treatment/Interventions  ADLs/Self Care Home Management;Cryotherapy;Electrical Stimulation;Iontophoresis 4mg /ml Dexamethasone;Moist Heat;Ultrasound;DME Instruction;Gait training;Stair training;Functional mobility training;Therapeutic activities;Therapeutic exercise;Neuromuscular re-education;Patient/family education;Manual techniques;Passive range of motion;Taping;Dry needling    PT Next Visit Plan  pateint was very gaurded with manual therapy. Add light stretching. Will likley need max cuing. She required max cuing for manual muscle testing. add resistance to psoterior chain strengthening if able, consider e-stim; consider heat;    PT Home Exercise Plan  breathing while letting her shoulders down; self soft tissue mobilization; scap retraction       Patient will benefit  from skilled therapeutic intervention in order to improve the following deficits and impairments:  Decreased endurance, Increased muscle spasms, Decreased range of motion, Decreased activity tolerance, Decreased safety awareness, Increased fascial restricitons, Impaired UE functional use, Pain, Improper body mechanics  Visit Diagnosis: Cervicalgia  Chronic right shoulder pain  Muscle weakness (generalized)     Problem List Patient Active Problem List   Diagnosis Date Noted  . Short gut syndrome  02/08/2019  . Dyspnea 02/16/2018  . 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 UTI (urinary tract infection) 11/27/2014  . UTI (lower urinary tract infection) 11/27/2014  . Acute cystitis with hematuria 11/13/2014  . Hemorrhoids, external 11/04/2014  . Wellness examination 10/03/2014  . Pain in joint, shoulder region 09/24/2014  . Cramp in limb 06/26/2014  . Rectal bleeding 05/02/2014  . External hemorrhoids 05/02/2014  . Hemorrhoids without complication [id number]  . Pain in joint, lower leg 02/01/2014  . Vitamin D deficiency 01/16/2014  . Gastroparesis 09/14/2013  . Other dysphagia 09/04/2013  . Nausea alone 09/04/2013  . Iron deficiency anemia 08/28/2013  . Hypomagnesemia 05/11/2013  . Hypokalemia 05/11/2013  . Generalized weakness 05/11/2013  . Dehydration 11/25/2011  . Fatigue 11/16/2011  . Breast cancer ([place]) 09/29/2011  . Family history of breast cancer 08/19/2011  . Primary cancer of lower-inner quadrant of left female breast ([place]) 08/17/2011  . Neck pain 07/29/2011  . Routine general medical examination at a health care facility 06/28/2011  . Edema 11/26/2010  . CLOSTRIDIUM DIFFICILE COLITIS 02/07/2010  . Blind loop syndrome 10/01/2009  . Abdominal pain 10/01/2009  . Vitamin B 12 deficiency 08/30/2009  . 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 Rash and nonspecific skin eruption 08/13/2009  . GOITER, MULTINODULAR 04/02/2009  . CONTACT DERMATITIS&OTHER ECZEMA DUE UNSPEC CAUSE 02/20/2009  . URINARY CALCULUS 10/11/2008  . Asymptomatic menopausal state 10/11/2008  . BACK PAIN, CHRONIC 07/03/2008  . Esophageal reflux 06/12/2008  . Diarrhea 06/12/2008  . ABDOMINAL PAIN-RUQ 05/17/2008  . Diabetes ([place]) 12/10/2006  . Dyslipidemia 12/10/2006  . Gout 12/10/2006  . HYPERTENSION 12/10/2006  . DIVERTICULOSIS, COLON 12/10/2006  . OSTEOARTHRITIS 12/10/2006  . ESOPHAGEAL STRICTURE 08/23/2002  . HIATAL HERNIA 08/23/2002  . INTERNAL HEMORRHOIDS  02/02/2001    Carney Living PT DPT  07/07/2019, 2:00 PM  [facility] [address] [place], [place], [address] Phone: [phone]   Fax:  [phone]  Name: YASAIRA ROSENGRANT MRN: [id number] Date of Birth: 29-Jan-1946

## 2019-07-11 ENCOUNTER — Other Ambulatory Visit: Payer: Self-pay | Admitting: Internal Medicine

## 2019-07-12 NOTE — Assessment & Plan Note (Addendum)
Dyspnea on exertion. Not clear what has changed to make it worse in past 2-3 months. Seems to vary, which would not be expected with radiation fibrosis. She has been anemic, which may contribute. Plan- Try sample Trelegy for effect. Schedule 6 minute walk test and PFT. She has some symptoms suggestive of OSA. Next phase we will look at sleep disordered breathing/ nocturnal hypoxemia.

## 2019-07-12 NOTE — Assessment & Plan Note (Signed)
Symptoms of nocturnal heartburn and choking. Plan- emphasize reflux precautions, including elevation of head of her adjustable bed.

## 2019-07-14 ENCOUNTER — Ambulatory Visit: Payer: Medicare Other | Admitting: Physical Therapy

## 2019-07-14 ENCOUNTER — Other Ambulatory Visit: Payer: Self-pay | Admitting: Family

## 2019-07-14 DIAGNOSIS — E890 Postprocedural hypothyroidism: Secondary | ICD-10-CM

## 2019-07-17 ENCOUNTER — Ambulatory Visit (INDEPENDENT_AMBULATORY_CARE_PROVIDER_SITE_OTHER): Payer: Medicare Other | Admitting: Family Medicine

## 2019-07-17 ENCOUNTER — Encounter: Payer: Self-pay | Admitting: Family Medicine

## 2019-07-17 ENCOUNTER — Other Ambulatory Visit (INDEPENDENT_AMBULATORY_CARE_PROVIDER_SITE_OTHER): Payer: Medicare Other

## 2019-07-17 ENCOUNTER — Other Ambulatory Visit: Payer: Self-pay

## 2019-07-17 VITALS — BP 110/76 | HR 74 | Ht 65.0 in | Wt 222.0 lb

## 2019-07-17 DIAGNOSIS — M542 Cervicalgia: Secondary | ICD-10-CM

## 2019-07-17 DIAGNOSIS — E890 Postprocedural hypothyroidism: Secondary | ICD-10-CM

## 2019-07-17 LAB — TSH: TSH: 0.34 u[IU]/mL — ABNORMAL LOW (ref 0.35–4.50)

## 2019-07-17 MED ORDER — CYCLOBENZAPRINE HCL 5 MG PO TABS: 5.0000 mg | ORAL_TABLET | Freq: Three times a day (TID) | ORAL | 1 refills | Status: DC | PRN

## 2019-07-17 NOTE — Patient Instructions (Addendum)
Thank you for coming in today. Continue physical therapy and home exercises.  Let me know if you worsen.  Recheck in about 1 month.

## 2019-07-17 NOTE — Progress Notes (Signed)
   I, [name], LAT, ATC, am serving as scribe for Dr. [name].  [name] is a 74 y.o. female who presents to [place] [facility] today for f/u of neck and R shoulder pain.  She was last seen by Dr. Georgina Snell on 06/19/19 and noted neck pain that was worse w/ cervical rotation and R shoulder pain that was worse w/ AROM >90 deg.  She has had 2 PT visits.  Since her last visit, pt reports that she is feeling better and notes 50% improvement.  She reports the majority of her symptoms are at the L side of her neck and upper trap.  She has more PT visits scheduled and notes that she has been doing her HEP.    Diagnostic testing: C-spine XR- 06/19/19   Pertinent review of systems: No fevers or chills  Relevant historical information: Hypertension.  Short gut syndrome   Exam:  BP 110/76 (BP Location: Right Arm, Patient Position: Sitting, Cuff Size: Large)   Pulse 74   Ht 5\' 5"  (1.651 m)   Wt 222 lb (100.7 kg)   SpO2 98%   BMI 36.94 kg/m  General: Well Developed, well nourished, and in no acute distress.   MSK:  C-spine: Normal-appearing. Slightly reduced rotation range of motion much improved from previous visit. Upper extremity strength is intact throughout.    Lab and Radiology Results  EXAM: CERVICAL SPINE - COMPLETE 4+ VIEW  COMPARISON:  None.  FINDINGS: Minimal grade 1 retrolisthesis of C4-5 is noted secondary to moderate degenerative disc disease at this level. Moderate degenerative disc disease is noted at C5-6 and C6-7 with anterior osteophyte formation. No fracture is noted. No prevertebral soft tissue swelling is noted. No significant neural foraminal stenosis is noted.  IMPRESSION: Multilevel degenerative disc disease. No acute abnormality seen in the cervical spine.   Electronically Signed   By: Marijo Conception M.D.   On: 06/19/2019 09:51 I, [name], personally (independently) visualized and performed the  interpretation of the images attached in this note.    Assessment and Plan: 74 y.o. female with 1 month follow-up cervical paraspinal muscle strain.  Much improved with home exercise program and limited physical therapy.  She still has room for improvement however.  Plan to continue physical therapy and home exercise program.  Limited cyclobenzaprine for muscle spasm which has been helpful.  Recheck back in 1 month.  Return sooner if needed.  Total encounter time 20 minutes including charting time date of service.    PDMP not reviewed this encounter. Orders Placed This Encounter  Procedures  . Ambulatory referral to Physical Therapy    Referral Priority:   Routine    Referral Type:   Physical Medicine    Referral Reason:   Specialty Services Required    Requested Specialty:   Physical Therapy   Meds ordered this encounter  Medications  . cyclobenzaprine (FLEXERIL) 5 MG tablet    Sig: Take 1-2 tablets (5-10 mg total) by mouth 3 (three) times daily as needed for muscle spasms. Take mostly at bedtime.    Dispense:  30 tablet    Refill:  1     Discussed warning signs or symptoms. Please see discharge instructions. Patient expresses understanding.   The above documentation has been reviewed and is accurate and complete [name]

## 2019-07-19 ENCOUNTER — Other Ambulatory Visit: Payer: Self-pay | Admitting: Family

## 2019-07-19 DIAGNOSIS — E890 Postprocedural hypothyroidism: Secondary | ICD-10-CM

## 2019-07-21 ENCOUNTER — Ambulatory Visit: Payer: Medicare Other | Admitting: Physical Therapy

## 2019-07-27 ENCOUNTER — Telehealth: Payer: Self-pay | Admitting: Gastroenterology

## 2019-07-27 MED ORDER — PANTOPRAZOLE SODIUM 40 MG PO TBEC: 40.0000 mg | DELAYED_RELEASE_TABLET | Freq: Every day | ORAL | 1 refills | Status: DC

## 2019-07-27 NOTE — Telephone Encounter (Signed)
Prescription sent to patient's pharmacy and patient notified.  

## 2019-07-28 ENCOUNTER — Other Ambulatory Visit: Payer: Self-pay

## 2019-07-28 ENCOUNTER — Ambulatory Visit: Payer: Medicare Other | Admitting: Physical Therapy

## 2019-07-28 DIAGNOSIS — M6281 Muscle weakness (generalized): Secondary | ICD-10-CM | POA: Diagnosis not present

## 2019-07-28 DIAGNOSIS — M25511 Pain in right shoulder: Secondary | ICD-10-CM

## 2019-07-28 DIAGNOSIS — M542 Cervicalgia: Secondary | ICD-10-CM | POA: Diagnosis not present

## 2019-07-28 DIAGNOSIS — G8929 Other chronic pain: Secondary | ICD-10-CM

## 2019-07-28 NOTE — Therapy (Addendum)
[place], [place], [address] Phone: [phone]   Fax:  [phone]  Physical Therapy Treatment/Discharge   Patient Details  Name: [name] MRN: [id number] Date of Birth: 08/09/1945 Referring Provider (PT): [name] MD   Encounter Date: 07/28/2019  PT End of Session - 07/28/19 0932    Visit Number  3    Number of Visits  12    Date for PT Re-Evaluation  08/09/19    Authorization Type  MCR    PT Start Time  0930    PT Stop Time  1012    PT Time Calculation (min)  42 min    Activity Tolerance  Patient tolerated treatment well    Behavior During Therapy  [facility] for tasks assessed/performed       Past Medical History:  Diagnosis Date  . Allergy   . ANEMIA, IRON DEFICIENCY 05/08/2009  . Angina   . ASYMPTOMATIC POSTMENOPAUSAL STATUS 10/11/2008  . Blood transfusion   . Blood transfusion without reported diagnosis   . Breast cancer ([place]) 09/29/11   invasive grade III ductal ca,assoc high grade dcis,ER/PR=neg  . C. difficile colitis   . Cataract   . Diverticulosis of colon (without mention of hemorrhage)   . Esophageal reflux 06/12/2008  . Gastroparesis   . GOITER, MULTINODULAR 04/02/2009  . Gout, unspecified   . H/O hiatal hernia   . History of lower GI bleeding   . History of radiation therapy 02/08/12-03/25/12   left breast,total 61gy  . Hypokalemia 05/11/2013  . Hypomagnesemia   . HYPOTHYROIDISM, POST-RADIATION 08/13/2009  . Internal hemorrhoids without mention of complication   . Kidney stones    "several"  . Leukopenia   . Migraines   . Obesity   . Osteoarthrosis, unspecified whether generalized or localized, unspecified site   . Other and unspecified hyperlipidemia   . Personal history of chemotherapy 2013  . Personal history of radiation therapy 2013   left  . PONV (postoperative nausea and vomiting)   . PUD (peptic ulcer disease)   . Short bowel syndrome   . Shortness of  breath on exertion    "sometimes"  . Stricture and stenosis of esophagus   . Thyrotoxicosis without mention of goiter or other cause, without mention of thyrotoxic crisis or storm   . Type II or unspecified type diabetes mellitus without mention of complication, not stated as uncontrolled    no med in years diet controled  . Unspecified essential hypertension   . UTI (urinary tract infection)   . Varicose veins   . VITAMIN B12 DEFICIENCY 08/30/2009    Past Surgical History:  Procedure Laterality Date  . ABDOMINAL ADHESION SURGERY  1980's thru 1990's   "several"  . ABDOMINAL HYSTERECTOMY  1970's   with BSO  . BREAST BIOPSY Left 08/13/11   left breast lower inner quadrant  . BREAST BIOPSY Right 1985   Rt exc bx, benign  . BREAST LUMPECTOMY Left 08/2011  . BREAST LUMPECTOMY W/ NEEDLE LOCALIZATION  09/29/11   left  breast=lymph node,excision benign/ ER/PR=neg, her 2 Positive  . BUNIONECTOMY  1970's   bilateral  . CHOLECYSTECTOMY  1990's  . COLON SURGERY     "several surgeries for short bowel syndrome"  . COLONOSCOPY  2012   multiple   . DILATION AND CURETTAGE OF UTERUS    . ESOPHAGOGASTRODUODENOSCOPY  2011   multiple   . EXCISIONAL HEMORRHOIDECTOMY  11/10/2016  . EYE SURGERY     "  long time ago"  . FLEXIBLE SIGMOIDOSCOPY  2011   multiple   . KIDNEY STONE SURGERY  1990's   "tried to go up & get it but pushed it further up"  . LITHOTRIPSY     "4 or 5 times"  . MASTECTOMY W/ NODES PARTIAL  09/29/11   left  . PORT-A-CATH REMOVAL Right 12/19/2013   Procedure: MINOR REMOVAL PORT-A-CATH;  Surgeon: Adin Hector, MD;  Location: [place];  Service: General;  Laterality: Right;  . PORTACATH PLACEMENT  09/29/2011   Procedure: INSERTION PORT-A-CATH;  Surgeon: Adin Hector, MD;  Location: [place];  Service: General;  Laterality: N/A;  . SMALL INTESTINE SURGERY    . Thyroid Ultrasound  12/1994 and 12/1995  . TOTAL KNEE ARTHROPLASTY Right 06/05/2015   Procedure: RIGHT  TOTAL KNEE ARTHROPLASTY;  Surgeon: Ninetta Lights, MD;  Location: [place];  Service: Orthopedics;  Laterality: Right;  . UPPER GASTROINTESTINAL ENDOSCOPY    . VEIN LIGATION AND STRIPPING  1980's   Right leg    There were no vitals filed for this visit.      [facility] PT Assessment - 07/28/19 0001      AROM   Left Shoulder Flexion  105 Degrees   minor pain    Cervical - Right Rotation  60    Cervical - Left Rotation  30                   [facility] Adult PT Treatment/Exercise - 07/28/19 0001      Self-Care   Self-Care  Other Self-Care Comments    Other Self-Care Comments   reviewed use of thera-can and where to purchase       Neck Exercises: Standing   Other Standing Exercises  scap retraction yellow mod vc's for technique and securing in the door 2x10 yellow; shoulder extension min vc's for technique       Neck Exercises: Seated   Other Seated Exercise  bilateral shoulder ER and horizontal abduction yellow band 2x10       Moist Heat Therapy   Number Minutes Moist Heat  --   Patient declined     Manual Therapy   Manual Therapy  Soft tissue mobilization;Manual Traction    Soft tissue mobilization  STM to upper trap and cervical spine; attmepted IASTYM but patient had some pain. continues to have low tolerance to light touch     Manual Traction  to cervical spine low tolerance with light pressure       Neck Exercises: Stretches   Upper Trapezius Stretch  2 reps;20 seconds    Levator Stretch  2 reps;20 seconds               PT Short Term Goals - 07/28/19 1047      PT SHORT TERM GOAL #1   Title  Patient will increase cervical rotation by 15 degrees bilateral    Time  3    Period  Weeks    Status  On-going    Target Date  07/19/19      PT SHORT TERM GOAL #2   Title  Patient will increase left shoulder strength to 3+/5    Time  3    Period  Weeks    Status  On-going    Target Date  07/19/19      PT SHORT TERM GOAL #3   Title  Patient will increased  bilateral active shoulder flexion to 110 without increased pain  Time  3    Period  Weeks    Status  On-going    Target Date  07/19/19        PT Long Term Goals - 06/28/19 1003      PT LONG TERM GOAL #1   Title  Patient will increase bilateral cervical rotation to 55 degrees without pain in order toimprove safety driving.    Time  6    Period  Weeks    Status  New    Target Date  08/09/19      PT LONG TERM GOAL #2   Title  Patient will lift 1 lb weight to shelf with left and right shoulder to perfrom ADL's    Time  6    Period  Weeks    Status  New    Target Date  08/09/19            Plan - 07/28/19 0936    Clinical Impression Statement  Patient is making progress. her cervicical motion has improved significantly but is still limited to the left (30 degrees). She is having better tolerance to softt tissue mobilization. Therapy reviewed ther-ex with the patient. Towards the end of the treatment the patient asked iif her neck could effect her eye. [place] this morning she reports she was driving and she flet like her she was having trouble with vision with her left eye. She reports it resovled quickly. She was advised if this happens again the second it happens to ontact her MD and head to the ED for evaluation. She reports she will. She had no vision issues during treatment.    Personal Factors and Comorbidities  Comorbidity 1;Comorbidity 2    Comorbidities  angina, left mastectomy    Examination-Activity Limitations  Locomotion Level;Carry;Reach Overhead    Examination-Participation Restrictions  Laundry;Meal Prep;Community Activity    Stability/Clinical Decision Making  Evolving/Moderate complexity    Clinical Decision Making  Moderate    Rehab Potential  Excellent    PT Frequency  2x / week    PT Duration  6 weeks    PT Treatment/Interventions  ADLs/Self Care Home Management;Cryotherapy;Electrical Stimulation;Iontophoresis 57m/ml Dexamethasone;Moist Heat;Ultrasound;DME  Instruction;Gait training;Stair training;Functional mobility training;Therapeutic activities;Therapeutic exercise;Neuromuscular re-education;Patient/family education;Manual techniques;Passive range of motion;Taping;Dry needling    PT Next Visit Plan  continue with manual therapy and postural exercises    PT Home Exercise Plan  breathing while letting her shoulders down; self soft tissue mobilization; scap retraction    Consulted and Agree with Plan of Care  Patient       Patient will benefit from skilled therapeutic intervention in order to improve the following deficits and impairments:     Visit Diagnosis: Cervicalgia  Chronic right shoulder pain  Muscle weakness (generalized)    PHYSICAL THERAPY DISCHARGE SUMMARY  Visits from Start of Care: 3  Current functional level related to goals / functional outcomes: Improved symptoms with movement    Remaining deficits: Pain at times    Education / Equipment: HEP   Plan: Patient agrees to discharge.  Patient goals were met. Patient is being discharged due to meeting the stated rehab goals.  ?????      Problem List Patient Active Problem List   Diagnosis Date Noted  . 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 Dyspnea 02/16/2018  . 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 UTI (urinary tract infection) 11/27/2014  . UTI (lower urinary tract infection) 11/27/2014  . Acute cystitis with hematuria 11/13/2014  . Hemorrhoids, external  11/04/2014  . Wellness examination 10/03/2014  . Pain in joint, shoulder region 09/24/2014  . Cramp in limb 06/26/2014  . Rectal bleeding 05/02/2014  . External hemorrhoids 05/02/2014  . Hemorrhoids without complication 52/12/221  . Pain in joint, lower leg 02/01/2014  . Vitamin D deficiency 01/16/2014  . Gastroparesis 09/14/2013  . Other dysphagia 09/04/2013  . Nausea alone 09/04/2013  . Iron deficiency anemia 08/28/2013  . Hypomagnesemia 05/11/2013  . Hypokalemia 05/11/2013  .  Generalized weakness 05/11/2013  . Dehydration 11/25/2011  . Fatigue 11/16/2011  . Breast cancer ([place]) 09/29/2011  . Family history of breast cancer 08/19/2011  . Primary cancer of lower-inner quadrant of left female breast ([place]) 08/17/2011  . Neck pain 07/29/2011  . Routine general medical examination at a health care facility 06/28/2011  . Edema 11/26/2010  . CLOSTRIDIUM DIFFICILE COLITIS 02/07/2010  . Blind loop syndrome 10/01/2009  . Abdominal pain 10/01/2009  . Vitamin B 12 deficiency 08/30/2009  . 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 Rash and nonspecific skin eruption 08/13/2009  . GOITER, MULTINODULAR 04/02/2009  . CONTACT DERMATITIS&OTHER ECZEMA DUE UNSPEC CAUSE 02/20/2009  . URINARY CALCULUS 10/11/2008  . Asymptomatic menopausal state 10/11/2008  . BACK PAIN, CHRONIC 07/03/2008  . Esophageal reflux 06/12/2008  . Diarrhea 06/12/2008  . ABDOMINAL PAIN-RUQ 05/17/2008  . Diabetes ([place]) 12/10/2006  . Dyslipidemia 12/10/2006  . Gout 12/10/2006  . HYPERTENSION 12/10/2006  . DIVERTICULOSIS, COLON 12/10/2006  . OSTEOARTHRITIS 12/10/2006  . ESOPHAGEAL STRICTURE 08/23/2002  . HIATAL HERNIA 08/23/2002  . INTERNAL HEMORRHOIDS 02/02/2001    Carney Living  PT DPT  07/28/2019, 10:51 AM  [facility] [address] [place], [place], [address] Phone: [phone]   Fax:  [phone]  Name: BEADIE MATSUNAGA MRN: [id number] Date of Birth: 1945/08/16

## 2019-07-31 DIAGNOSIS — H2000 Unspecified acute and subacute iridocyclitis: Secondary | ICD-10-CM | POA: Diagnosis not present

## 2019-08-03 ENCOUNTER — Ambulatory Visit: Payer: Medicare Other | Admitting: Physical Therapy

## 2019-08-14 ENCOUNTER — Other Ambulatory Visit: Payer: Self-pay

## 2019-08-14 ENCOUNTER — Encounter: Payer: Self-pay | Admitting: Family Medicine

## 2019-08-14 ENCOUNTER — Telehealth: Payer: Self-pay | Admitting: Family

## 2019-08-14 ENCOUNTER — Ambulatory Visit (INDEPENDENT_AMBULATORY_CARE_PROVIDER_SITE_OTHER): Payer: Medicare Other | Admitting: Family Medicine

## 2019-08-14 VITALS — BP 130/80 | HR 82 | Ht 65.0 in | Wt 221.2 lb

## 2019-08-14 DIAGNOSIS — G8929 Other chronic pain: Secondary | ICD-10-CM | POA: Diagnosis not present

## 2019-08-14 DIAGNOSIS — M25512 Pain in left shoulder: Secondary | ICD-10-CM

## 2019-08-14 DIAGNOSIS — M25511 Pain in right shoulder: Secondary | ICD-10-CM

## 2019-08-14 DIAGNOSIS — M542 Cervicalgia: Secondary | ICD-10-CM

## 2019-08-14 DIAGNOSIS — H2000 Unspecified acute and subacute iridocyclitis: Secondary | ICD-10-CM | POA: Diagnosis not present

## 2019-08-14 NOTE — Patient Instructions (Signed)
Thank you for coming in today. Continue home exercises.  Recheck with me as needed.

## 2019-08-14 NOTE — Progress Notes (Signed)
   I, [name], LAT, ATC, am serving as scribe for Dr. [name].  [name] is a 74 y.o. female who presents to [place] at [facility] today for f/u of neck and R shoulder pain.  She was last seen by Dr. Georgina Snell on 07/17/19 and noted 50% improvement in her symptoms.  She has completed 3 PT sessions and has been completing her HEP as prescribed by PT.  Since her last visit, pt reports that she has had complete resolution of her symptoms.  She con't to do her HEP.   Pertinent review of systems: No fevers or chills  Relevant historical information: History of short gut syndrome   Exam:  BP 130/80 (BP Location: Left Arm, Patient Position: Sitting, Cuff Size: Large)   Pulse 82   Ht 5\' 5"  (1.651 m)   Wt 221 lb 3.2 oz (100.3 kg)   SpO2 98%   BMI 36.81 kg/m  General: Well Developed, well nourished, and in no acute distress.   MSK: C-spine normal motion normal R upper extremity motion.  Normal gait.      Assessment and Plan: 74 y.o. female with resolved neck and shoulder pain following physical therapy.  Discussed plan for recurrence.  Encouraged continued home exercise program and recheck back with me as needed.     Discussed warning signs or symptoms. Please see discharge instructions. Patient expresses understanding.   The above documentation has been reviewed and is accurate and complete [name]   Total encounter time 20 minutes including charting time date of service. Discussed next steps or plan for recurrence.  Stressed the importance of continued home excise program.  Additionally COVID-19 vaccine information updated in medical record system.

## 2019-08-14 NOTE — Telephone Encounter (Signed)
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 She states the mental health question was filled out wrong and would like for this to be fixed and faxed over to the [facility] department. She said if we can please notify her when this change has been made.

## 2019-08-15 NOTE — Telephone Encounter (Signed)
Spoke with patient today and she will bring form back in. States form was filled out awhile ago.

## 2019-08-16 ENCOUNTER — Ambulatory Visit: Payer: Medicare Other | Admitting: Physical Therapy

## 2019-08-17 ENCOUNTER — Other Ambulatory Visit (INDEPENDENT_AMBULATORY_CARE_PROVIDER_SITE_OTHER): Payer: Medicare Other

## 2019-08-17 DIAGNOSIS — E890 Postprocedural hypothyroidism: Secondary | ICD-10-CM | POA: Diagnosis not present

## 2019-08-17 LAB — MAGNESIUM: Magnesium: 1.7 mg/dL (ref 1.5–2.5)

## 2019-08-17 LAB — TSH: TSH: 0.08 u[IU]/mL — ABNORMAL LOW (ref 0.35–4.50)

## 2019-08-18 ENCOUNTER — Other Ambulatory Visit: Payer: Self-pay

## 2019-08-18 ENCOUNTER — Other Ambulatory Visit: Payer: Self-pay | Admitting: Family

## 2019-08-18 MED ORDER — LEVOTHYROXINE SODIUM 200 MCG PO TABS: 200.0000 ug | ORAL_TABLET | Freq: Every day | ORAL | 0 refills | Status: DC

## 2019-08-21 ENCOUNTER — Encounter: Payer: Self-pay | Admitting: Endocrinology

## 2019-08-21 ENCOUNTER — Ambulatory Visit (INDEPENDENT_AMBULATORY_CARE_PROVIDER_SITE_OTHER): Payer: Medicare Other | Admitting: Endocrinology

## 2019-08-21 ENCOUNTER — Other Ambulatory Visit: Payer: Self-pay

## 2019-08-21 VITALS — BP 124/78 | HR 85 | Ht 65.0 in | Wt 221.0 lb

## 2019-08-21 DIAGNOSIS — E042 Nontoxic multinodular goiter: Secondary | ICD-10-CM | POA: Diagnosis not present

## 2019-08-21 DIAGNOSIS — E119 Type 2 diabetes mellitus without complications: Secondary | ICD-10-CM | POA: Diagnosis not present

## 2019-08-21 DIAGNOSIS — R7303 Prediabetes: Secondary | ICD-10-CM | POA: Diagnosis not present

## 2019-08-21 LAB — POCT GLYCOSYLATED HEMOGLOBIN (HGB A1C): Hemoglobin A1C: 5.5 % (ref 4.0–5.6)

## 2019-08-21 MED ORDER — LEVOTHYROXINE SODIUM 200 MCG PO TABS: 200.0000 ug | ORAL_TABLET | Freq: Every day | ORAL | 0 refills | Status: DC

## 2019-08-21 NOTE — Patient Instructions (Addendum)
Please take just 200 mcg of the levothyroxine.  I have sent a prescription to your pharmacy.   Let's recheck the ultrasound.  you will receive a phone call, about a day and time for an appointment.  No medication is needed for the diabetes now.   Please come back for a follow-up appointment in 6 weeks.

## 2019-08-21 NOTE — Progress Notes (Signed)
Subjective:    Patient ID: [name], female    DOB: 1945-08-30, 74 y.o.   MRN: [id number]  HPI Pt returns for f/u of diabetes mellitus: DM type: 2 Dx'ed: 0000000 Complications: renal insuff Therapy: no medication recently GDM: never DKA: never Severe hypoglycemia: never Pancreatitis: never Pancreatic imaging: normal on 2020 CT.  SDOH: none Other: she has never been on insulin. Interval history: pt states she feels well in general. 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   Past Medical History:  Diagnosis Date  . Allergy   . ANEMIA, IRON DEFICIENCY 05/08/2009  . Angina   . ASYMPTOMATIC POSTMENOPAUSAL STATUS 10/11/2008  . Blood transfusion   . Blood transfusion without reported diagnosis   . Breast cancer ([place]) 09/29/11   invasive grade III ductal ca,assoc high grade dcis,ER/PR=neg  . C. difficile colitis   . Cataract   . Diverticulosis of colon (without mention of hemorrhage)   . Esophageal reflux 06/12/2008  . Gastroparesis   . GOITER, MULTINODULAR 04/02/2009  . Gout, unspecified   . H/O hiatal hernia   . History of lower GI bleeding   . History of radiation therapy 02/08/12-03/25/12   left breast,total 61gy  . Hypokalemia 05/11/2013  . Hypomagnesemia   . HYPOTHYROIDISM, POST-RADIATION 08/13/2009  . Internal hemorrhoids without mention of complication   . Kidney stones    "several"  . Leukopenia   . Migraines   . Obesity   . Osteoarthrosis, unspecified whether generalized or localized, unspecified site   . Other and unspecified hyperlipidemia   . Personal history of chemotherapy 2013  . Personal history of radiation therapy 2013   left  . PONV (postoperative nausea and vomiting)   . PUD (peptic ulcer disease)   . Short bowel syndrome   . Shortness of breath on exertion    "sometimes"  . Stricture and stenosis of esophagus   . Thyrotoxicosis  without mention of goiter or other cause, without mention of thyrotoxic crisis or storm   . Type II or unspecified type diabetes mellitus without mention of complication, not stated as uncontrolled    no med in years diet controled  . Unspecified essential hypertension   . UTI (urinary tract infection)   . Varicose veins   . VITAMIN B12 DEFICIENCY 08/30/2009    Past Surgical History:  Procedure Laterality Date  . ABDOMINAL ADHESION SURGERY  1980's thru 1990's   "several"  . ABDOMINAL HYSTERECTOMY  1970's   with BSO  . BREAST BIOPSY Left 08/13/11   left breast lower inner quadrant  . BREAST BIOPSY Right 1985   Rt exc bx, benign  . BREAST LUMPECTOMY Left 08/2011  . BREAST LUMPECTOMY W/ NEEDLE LOCALIZATION  09/29/11   left  breast=lymph node,excision benign/ ER/PR=neg, her 2 Positive  . BUNIONECTOMY  1970's   bilateral  . CHOLECYSTECTOMY  1990's  . COLON SURGERY     "several surgeries for short bowel syndrome"  . COLONOSCOPY  2012   multiple   . DILATION AND CURETTAGE OF UTERUS    . ESOPHAGOGASTRODUODENOSCOPY  2011   multiple   . EXCISIONAL HEMORRHOIDECTOMY  11/10/2016  . EYE SURGERY     "long time ago"  . FLEXIBLE SIGMOIDOSCOPY  2011   multiple   . KIDNEY STONE SURGERY  1990's   "tried to go up & get it but pushed it further up"  .  LITHOTRIPSY     "4 or 5 times"  . MASTECTOMY W/ NODES PARTIAL  09/29/11   left  . PORT-A-CATH REMOVAL Right 12/19/2013   Procedure: MINOR REMOVAL PORT-A-CATH;  Surgeon: Adin Hector, MD;  Location: [facility];  Service: General;  Laterality: Right;  . PORTACATH PLACEMENT  09/29/2011   Procedure: INSERTION PORT-A-CATH;  Surgeon: Adin Hector, MD;  Location: [facility];  Service: General;  Laterality: N/A;  . SMALL INTESTINE SURGERY    . Thyroid Ultrasound  12/1994 and 12/1995  . TOTAL KNEE ARTHROPLASTY Right 06/05/2015   Procedure: RIGHT TOTAL KNEE ARTHROPLASTY;  Surgeon: Ninetta Lights, MD;  Location: [facility];  Service: Orthopedics;   Laterality: Right;  . UPPER GASTROINTESTINAL ENDOSCOPY    . VEIN LIGATION AND STRIPPING  1980's   Right leg    Social History   Socioeconomic History  . Marital status: Widowed    Spouse name: Not on file  . Number of children: 2  . Years of education: Not on file  . Highest education level: Not on file  Occupational History  . Occupation: RETIRED  Tobacco Use  . Smoking status: Former Smoker    Packs/day: 1.00    Years: 10.00    Pack years: 10.00    Types: Cigarettes    Quit date: 09/22/1985    Years since quitting: 33.9  . Smokeless tobacco: Never Used  Substance and Sexual Activity  . Alcohol use: No    Comment: 09/29/11 "used to drink socially years ago"  . Drug use: No  . Sexual activity: Yes    Comment: 1st intercourse- 15, partners- 41, boyfriend- 1.8 yrs  Other Topics Concern  . Not on file  Social History Narrative   Pt gets regular exercise   Social Determinants of Health   Financial Resource Strain:   . Difficulty of Paying Living Expenses:   Food Insecurity:   . Worried About Charity fundraiser in the Last Year:   . Arboriculturist in the Last Year:   Transportation Needs:   . Film/video editor (Medical):   Marland Kitchen Lack of Transportation (Non-Medical):   Physical Activity:   . Days of Exercise per Week:   . Minutes of Exercise per Session:   Stress:   . Feeling of Stress :   Social Connections:   . Frequency of Communication with Friends and Family:   . Frequency of Social Gatherings with Friends and Family:   . Attends Religious Services:   . Active Member of Clubs or Organizations:   . Attends Archivist Meetings:   Marland Kitchen Marital Status:   Intimate Partner Violence:   . Fear of Current or Ex-Partner:   . Emotionally Abused:   Marland Kitchen Physically Abused:   . Sexually Abused:     Current Outpatient Medications on File Prior to Visit  Medication Sig Dispense Refill  . allopurinol (ZYLOPRIM) 100 MG tablet Take 1 tablet (100 mg total) by mouth  daily. 90 tablet 3  . amitriptyline (ELAVIL) 50 MG tablet Take 1 tablet (50 mg total) by mouth at bedtime. 90 tablet 3  . calcium carbonate (TUMS - DOSED IN MG ELEMENTAL CALCIUM) 500 MG chewable tablet Chew 1 tablet by mouth as needed for indigestion or heartburn.    . Calcium Carbonate-Vitamin D (CALCIUM-VITAMIN D) 600-200 MG-UNIT CAPS Take 1 capsule by mouth daily.      . Cyanocobalamin (VITAMIN B 12 PO) Take 1 tablet by mouth 2 (two) times  daily.     . cyclobenzaprine (FLEXERIL) 5 MG tablet Take 1-2 tablets (5-10 mg total) by mouth 3 (three) times daily as needed for muscle spasms. Take mostly at bedtime. 30 tablet 1  . dicyclomine (BENTYL) 20 MG tablet Take 1 tablet (20 mg total) by mouth 3 (three) times daily as needed for spasms. 360 tablet 3  . diphenoxylate-atropine (LOMOTIL) 2.5-0.025 MG tablet Take 1 tablet by mouth 3 times daily as needed for diarrhea for >3-4 stool /day. 270 tablet 0  . famotidine (PEPCID) 40 MG tablet Take 1 tablet (40 mg total) by mouth at bedtime. 90 tablet 3  . ferrous sulfate 325 (65 FE) MG tablet Take 325 mg by mouth daily with breakfast.    . fluticasone (FLONASE) 50 MCG/ACT nasal spray Place 2 sprays into both nostrils daily. (Patient taking differently: Place 2 sprays into both nostrils daily as needed. ) 16 g 6  . Fluticasone-Umeclidin-Vilant (TRELEGY ELLIPTA) 100-62.5-25 MCG/INH AEPB Inhale 1 puff into the lungs daily. 28 each 0  . furosemide (LASIX) 40 MG tablet Take 1 tablet (40 mg total) by mouth daily. 90 tablet 3  . gentamicin cream (GARAMYCIN) 0.1 % Apply 1 application topically 2 (two) times daily. 15 g 1  . loratadine (CLARITIN) 10 MG tablet Take 1 tablet (10 mg total) by mouth daily. 30 tablet 11  . lovastatin (MEVACOR) 20 MG tablet TAKE 1 TABLET BY MOUTH  DAILY AT 6 PM. 90 tablet 2  . Magnesium 500 MG CAPS Take 1 mg by mouth 2 (two) times daily.     . methocarbamol (ROBAXIN) 500 MG tablet Take 1 tablet (500 mg total) by mouth every 8 (eight) hours as  needed for muscle spasms. 30 tablet 0  . metoCLOPramide (REGLAN) 5 MG tablet Take 1 tablet (5 mg total) by mouth every 8 (eight) hours as needed for nausea. 15 tablet 0  . nitroGLYCERIN (NITROSTAT) 0.4 MG SL tablet Place 1 tablet (0.4 mg total) under the tongue every 5 (five) minutes as needed. 25 tablet 6  . pantoprazole (PROTONIX) 40 MG tablet Take 1 tablet (40 mg total) by mouth daily before breakfast. TAKE 1 TABLET BY MOUTH 30 MINUTES PRIOR TO BREAKFAST AND SUPPER 90 tablet 1  . sucralfate (CARAFATE) 1 g tablet Please crush 1 gram tablet and mix with water to make it into a slurry. Use 2 times daily for 2 weeks. 28 tablet 0  . Thiamine HCl (THIAMINE PO) Take 1 tablet by mouth daily.      Current Facility-Administered Medications on File Prior to Visit  Medication Dose Route Frequency Provider Last Rate Last Admin  . acetaminophen (TYLENOL) tablet 1,000 mg  1,000 mg Oral Once Amada Kingfisher, MD        Allergies  Allergen Reactions  . Aspirin Other (See Comments)    REACTION: Gi Intolerance/ Burning in stomach  . Iodinated Diagnostic Agents Itching  . Trazodone And Nefazodone     "sick"  . Flagyl [Metronidazole] Rash  . Iodine Itching    Allergic to IVP dye  . Morphine And Related Itching    Family History  Problem Relation Age of Onset  . Esophageal cancer Son        deceased  . Breast cancer Son        esophageal  . Diabetes Mother   . Heart disease Mother   . Kidney disease Sister   . Diabetes Father   . Hypertension Father   . Kidney disease Brother  x 3  . Colon cancer Paternal Uncle   . Breast cancer Maternal Aunt   . Breast cancer Paternal Aunt   . Breast cancer Cousin        Pt states she has 15+ cousins w/ Breast CA  . Rectal cancer Neg Hx   . Stomach cancer Neg Hx     BP 124/78   Pulse 85   Ht 5\' 5"  (1.651 m)   Wt 221 lb (100.2 kg)   SpO2 99%   BMI 36.78 kg/m    Review of Systems     Objective:   Physical Exam    Lab Results  Component  Value Date   HGBA1C 5.5 08/21/2019   Lab Results  Component Value Date   TSH 0.08 (L) 08/17/2019   T4TOTAL 8.1 07/29/2007   Lab Results  Component Value Date   CREATININE 1.26 (H) 06/29/2019   BUN 21 06/29/2019   NA 138 06/29/2019   K 3.9 06/29/2019   CL 98 06/29/2019   CO2 24 06/29/2019       Assessment & Plan:  Type 2 DM: stable off medication.  MNG: due for recheck.  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  i'll try to minimize changes.    Patient Instructions  Please take just 200 mcg of the levothyroxine.  I have sent a prescription to your pharmacy.   Let's recheck the ultrasound.  you will receive a phone call, about a day and time for an appointment.  No medication is needed for the diabetes now.   Please come back for a follow-up appointment in 6 weeks.

## 2019-08-22 ENCOUNTER — Ambulatory Visit
Admission: RE | Admit: 2019-08-22 | Discharge: 2019-08-22 | Disposition: A | Discharge status: Home / Self Care | Payer: Medicare Other | Source: Ambulatory Visit | Attending: Endocrinology | Admitting: Endocrinology

## 2019-08-22 DIAGNOSIS — E042 Nontoxic multinodular goiter: Secondary | ICD-10-CM

## 2019-08-22 DIAGNOSIS — E041 Nontoxic single thyroid nodule: Secondary | ICD-10-CM | POA: Diagnosis not present

## 2019-08-23 ENCOUNTER — Telehealth: Payer: Self-pay

## 2019-08-23 NOTE — Telephone Encounter (Signed)
IMAGING RESULTS  Imaging results were reviewed by Dr. Loanne Drilling. A letter has been mailed to pt home address. For future reference, letter can be found in [facility].

## 2019-08-23 NOTE — Telephone Encounter (Signed)
-----   Message from Renato Shin, MD sent at 08/22/2019  6:11 PM EDT ----- please contact patient: US shows no change--good.  As you have had the Radioactive iodine treatment pill, you do not need a biopsy.  I'll see you next time.

## 2019-08-29 ENCOUNTER — Other Ambulatory Visit: Payer: Medicare Other

## 2019-09-11 ENCOUNTER — Ambulatory Visit (INDEPENDENT_AMBULATORY_CARE_PROVIDER_SITE_OTHER): Payer: Medicare Other | Admitting: Internal Medicine

## 2019-09-11 ENCOUNTER — Other Ambulatory Visit: Payer: Self-pay

## 2019-09-11 ENCOUNTER — Encounter: Payer: Self-pay | Admitting: Internal Medicine

## 2019-09-11 VITALS — BP 116/78 | HR 80 | Ht 67.0 in | Wt 223.0 lb

## 2019-09-11 DIAGNOSIS — E118 Type 2 diabetes mellitus with unspecified complications: Secondary | ICD-10-CM

## 2019-09-11 DIAGNOSIS — R0609 Other forms of dyspnea: Secondary | ICD-10-CM

## 2019-09-11 DIAGNOSIS — Z853 Personal history of malignant neoplasm of breast: Secondary | ICD-10-CM

## 2019-09-11 DIAGNOSIS — R600 Localized edema: Secondary | ICD-10-CM

## 2019-09-11 DIAGNOSIS — E039 Hypothyroidism, unspecified: Secondary | ICD-10-CM | POA: Diagnosis not present

## 2019-09-11 DIAGNOSIS — Q2544 Congenital dilation of aorta: Secondary | ICD-10-CM | POA: Diagnosis not present

## 2019-09-11 DIAGNOSIS — R0600 Dyspnea, unspecified: Secondary | ICD-10-CM

## 2019-09-11 DIAGNOSIS — R0602 Shortness of breath: Secondary | ICD-10-CM | POA: Diagnosis not present

## 2019-09-11 DIAGNOSIS — R0683 Snoring: Secondary | ICD-10-CM

## 2019-09-11 DIAGNOSIS — Q2549 Other congenital malformations of aorta: Secondary | ICD-10-CM

## 2019-09-11 NOTE — Progress Notes (Signed)
Cardiology Office Note:    Date:  09/11/2019   ID:  [name], DOB 1945/07/28, MRN [id number]  PCP:  Marrian Salvage, [place]  Cardiologist:  Elouise Munroe, MD  Electrophysiologist:  None   Referring MD: Marrian Salvage,*   Chief Complaint: f/u SOB and leg swelling  History of Present Illness:    [name] is a 74 y.o. female with a history of  left breast cancer with chemotherapy and radiation, GERD, hypothyroidism,mild CAD by CCTA,and diabetes mellitus type 2 diet controlled.  We started Lasix 40 mg daily at the end of January and she noticed an improvement in her lower extremity swelling.  She is wearing compression socks today and tells me that her swelling is not gone despite Lasix and compression socks.  Unclear what else could be driving lower extremity swelling given that echocardiogram was reassuring.  Encouraged dietary compliance and continued compression stocking use.  Discussed in detail whether she would like to stop Lasix or not, we both agreed she should continue.  For shortness of breath she is seeing pulmonary medicine but has not yet completed work-up.  I have encouraged her to contact their office to ensure she is scheduled for PFTs and any other recommended testing.  She does have a history of smoking.  Pulmonary medicine documentation suggest she may need an evaluation for sleep apnea due to snoring.  This has not yet been coordinated, therefore we can coordinate this through our office.  She tried the sample of Trelegy from their office but does not feel that it helped.  She denies chest pain, endorses shortness of breath.  Denies palpitations.  Past Medical History:  Diagnosis Date  . Allergy   . ANEMIA, IRON DEFICIENCY 05/08/2009  . Angina   . ASYMPTOMATIC POSTMENOPAUSAL STATUS 10/11/2008  . Blood transfusion   . Blood transfusion without reported diagnosis   . Breast cancer ([place]) 09/29/11   invasive grade III ductal ca,assoc  high grade dcis,ER/PR=neg  . C. difficile colitis   . Cataract   . Diverticulosis of colon (without mention of hemorrhage)   . Esophageal reflux 06/12/2008  . Gastroparesis   . GOITER, MULTINODULAR 04/02/2009  . Gout, unspecified   . H/O hiatal hernia   . History of lower GI bleeding   . History of radiation therapy 02/08/12-03/25/12   left breast,total 61gy  . Hypokalemia 05/11/2013  . Hypomagnesemia   . HYPOTHYROIDISM, POST-RADIATION 08/13/2009  . Internal hemorrhoids without mention of complication   . Kidney stones    "several"  . Leukopenia   . Migraines   . Obesity   . Osteoarthrosis, unspecified whether generalized or localized, unspecified site   . Other and unspecified hyperlipidemia   . Personal history of chemotherapy 2013  . Personal history of radiation therapy 2013   left  . PONV (postoperative nausea and vomiting)   . PUD (peptic ulcer disease)   . Short bowel syndrome   . Shortness of breath on exertion    "sometimes"  . Stricture and stenosis of esophagus   . Thyrotoxicosis without mention of goiter or other cause, without mention of thyrotoxic crisis or storm   . Type II or unspecified type diabetes mellitus without mention of complication, not stated as uncontrolled    no med in years diet controled  . Unspecified essential hypertension   . UTI (urinary tract infection)   . Varicose veins   . VITAMIN B12 DEFICIENCY 08/30/2009    Past Surgical History:  Procedure  Laterality Date  . ABDOMINAL ADHESION SURGERY  1980's thru 1990's   "several"  . ABDOMINAL HYSTERECTOMY  1970's   with BSO  . BREAST BIOPSY Left 08/13/11   left breast lower inner quadrant  . BREAST BIOPSY Right 1985   Rt exc bx, benign  . BREAST LUMPECTOMY Left 08/2011  . BREAST LUMPECTOMY W/ NEEDLE LOCALIZATION  09/29/11   left  breast=lymph node,excision benign/ ER/PR=neg, her 2 Positive  . BUNIONECTOMY  1970's   bilateral  . CHOLECYSTECTOMY  1990's  . COLON SURGERY     "several surgeries  for short bowel syndrome"  . COLONOSCOPY  2012   multiple   . DILATION AND CURETTAGE OF UTERUS    . ESOPHAGOGASTRODUODENOSCOPY  2011   multiple   . EXCISIONAL HEMORRHOIDECTOMY  11/10/2016  . EYE SURGERY     "long time ago"  . FLEXIBLE SIGMOIDOSCOPY  2011   multiple   . KIDNEY STONE SURGERY  1990's   "tried to go up & get it but pushed it further up"  . LITHOTRIPSY     "4 or 5 times"  . MASTECTOMY W/ NODES PARTIAL  09/29/11   left  . PORT-A-CATH REMOVAL Right 12/19/2013   Procedure: MINOR REMOVAL PORT-A-CATH;  Surgeon: Adin Hector, MD;  Location: [facility];  Service: General;  Laterality: Right;  . PORTACATH PLACEMENT  09/29/2011   Procedure: INSERTION PORT-A-CATH;  Surgeon: Adin Hector, MD;  Location: [facility];  Service: General;  Laterality: N/A;  . SMALL INTESTINE SURGERY    . Thyroid Ultrasound  12/1994 and 12/1995  . TOTAL KNEE ARTHROPLASTY Right 06/05/2015   Procedure: RIGHT TOTAL KNEE ARTHROPLASTY;  Surgeon: Ninetta Lights, MD;  Location: [facility];  Service: Orthopedics;  Laterality: Right;  . UPPER GASTROINTESTINAL ENDOSCOPY    . VEIN LIGATION AND STRIPPING  1980's   Right leg    Current Medications: Current Meds  Medication Sig  . allopurinol (ZYLOPRIM) 100 MG tablet Take 1 tablet (100 mg total) by mouth daily.  Marland Kitchen amitriptyline (ELAVIL) 50 MG tablet Take 1 tablet (50 mg total) by mouth at bedtime.  . calcium carbonate (TUMS - DOSED IN MG ELEMENTAL CALCIUM) 500 MG chewable tablet Chew 1 tablet by mouth as needed for indigestion or heartburn.  . Calcium Carbonate-Vitamin D (CALCIUM-VITAMIN D) 600-200 MG-UNIT CAPS Take 1 capsule by mouth daily.    . Cyanocobalamin (VITAMIN B 12 PO) Take 1 tablet by mouth 2 (two) times daily.   . cyclobenzaprine (FLEXERIL) 5 MG tablet Take 1-2 tablets (5-10 mg total) by mouth 3 (three) times daily as needed for muscle spasms. Take mostly at bedtime.  . dicyclomine (BENTYL) 20 MG tablet Take 1 tablet (20 mg total) by mouth 3  (three) times daily as needed for spasms.  . diphenoxylate-atropine (LOMOTIL) 2.5-0.025 MG tablet Take 1 tablet by mouth 3 times daily as needed for diarrhea for >3-4 stool /day.  . famotidine (PEPCID) 40 MG tablet Take 1 tablet (40 mg total) by mouth at bedtime.  . ferrous sulfate 325 (65 FE) MG tablet Take 325 mg by mouth daily with breakfast.  . fluticasone (FLONASE) 50 MCG/ACT nasal spray Place 2 sprays into both nostrils daily. (Patient taking differently: Place 2 sprays into both nostrils daily as needed. )  . Fluticasone-Umeclidin-Vilant (TRELEGY ELLIPTA) 100-62.5-25 MCG/INH AEPB Inhale 1 puff into the lungs daily.  . furosemide (LASIX) 40 MG tablet Take 1 tablet (40 mg total) by mouth daily.  Marland Kitchen gentamicin cream (GARAMYCIN) 0.1 %  Apply 1 application topically 2 (two) times daily.  Marland Kitchen levothyroxine (SYNTHROID) 200 MCG tablet Take 1 tablet (200 mcg total) by mouth daily before breakfast.  . loratadine (CLARITIN) 10 MG tablet Take 1 tablet (10 mg total) by mouth daily.  Marland Kitchen lovastatin (MEVACOR) 20 MG tablet TAKE 1 TABLET BY MOUTH  DAILY AT 6 PM.  . Magnesium 500 MG CAPS Take 1 mg by mouth 2 (two) times daily.   . methocarbamol (ROBAXIN) 500 MG tablet Take 1 tablet (500 mg total) by mouth every 8 (eight) hours as needed for muscle spasms.  . metoCLOPramide (REGLAN) 5 MG tablet Take 1 tablet (5 mg total) by mouth every 8 (eight) hours as needed for nausea.  . nitroGLYCERIN (NITROSTAT) 0.4 MG SL tablet Place 1 tablet (0.4 mg total) under the tongue every 5 (five) minutes as needed.  . pantoprazole (PROTONIX) 40 MG tablet Take 1 tablet (40 mg total) by mouth daily before breakfast. TAKE 1 TABLET BY MOUTH 30 MINUTES PRIOR TO BREAKFAST AND SUPPER  . sucralfate (CARAFATE) 1 g tablet Please crush 1 gram tablet and mix with water to make it into a slurry. Use 2 times daily for 2 weeks.  . Thiamine HCl (THIAMINE PO) Take 1 tablet by mouth daily.      Allergies:   Aspirin, Iodinated diagnostic agents,  Trazodone and nefazodone, Flagyl [metronidazole], Iodine, and Morphine and related   Social History   Socioeconomic History  . Marital status: Widowed    Spouse name: Not on file  . Number of children: 2  . Years of education: Not on file  . Highest education level: Not on file  Occupational History  . Occupation: RETIRED  Tobacco Use  . Smoking status: Former Smoker    Packs/day: 1.00    Years: 10.00    Pack years: 10.00    Types: Cigarettes    Quit date: 09/22/1985    Years since quitting: 33.9  . Smokeless tobacco: Never Used  Substance and Sexual Activity  . Alcohol use: No    Comment: 09/29/11 "used to drink socially years ago"  . Drug use: No  . Sexual activity: Yes    Comment: 1st intercourse- 15, partners- 76, boyfriend- 1.8 yrs  Other Topics Concern  . Not on file  Social History Narrative   Pt gets regular exercise   Social Determinants of Health   Financial Resource Strain:   . Difficulty of Paying Living Expenses:   Food Insecurity:   . Worried About Charity fundraiser in the Last Year:   . Arboriculturist in the Last Year:   Transportation Needs:   . Film/video editor (Medical):   Marland Kitchen Lack of Transportation (Non-Medical):   Physical Activity:   . Days of Exercise per Week:   . Minutes of Exercise per Session:   Stress:   . Feeling of Stress :   Social Connections:   . Frequency of Communication with Friends and Family:   . Frequency of Social Gatherings with Friends and Family:   . Attends Religious Services:   . Active Member of Clubs or Organizations:   . Attends Archivist Meetings:   Marland Kitchen Marital Status:      Family History: The patient's family history includes Breast cancer in her cousin, maternal aunt, paternal aunt, and son; Colon cancer in her paternal uncle; Diabetes in her father and mother; Esophageal cancer in her son; Heart disease in her mother; Hypertension in her father; Kidney disease in  her brother and sister. There is  no history of Rectal cancer or Stomach cancer.  ROS:   Please see the history of present illness.    All other systems reviewed and are negative.  EKGs/Labs/Other Studies Reviewed:    The following studies were reviewed today:  EKG:  SR, first deg AV block PR 210 ms.  Recent Labs: 02/07/2019: Pro B Natriuretic peptide (BNP) 115.0 06/16/2019: ALT 19; Hemoglobin 10.4; Platelets 256.0 06/29/2019: BNP 39.9; BUN 21; Creatinine, Ser 1.26; Potassium 3.9; Sodium 138 08/17/2019: Magnesium 1.7; TSH 0.08  Recent Lipid Panel    Component Value Date/Time   CHOL 150 03/17/2018 1200   TRIG 112.0 03/17/2018 1200   HDL 75.50 03/17/2018 1200   CHOLHDL 2 03/17/2018 1200   VLDL 22.4 03/17/2018 1200   LDLCALC 52 03/17/2018 1200   LDLDIRECT 53.0 10/29/2015 1338    Physical Exam:    VS:  BP 116/78 (BP Location: Left Arm, Patient Position: Sitting, Cuff Size: Large)   Pulse 80   Ht 5\' 7"  (1.702 m)   Wt 223 lb (101.2 kg)   BMI 34.93 kg/m     Wt Readings from Last 5 Encounters:  09/11/19 223 lb (101.2 kg)  08/21/19 221 lb (100.2 kg)  08/14/19 221 lb 3.2 oz (100.3 kg)  07/17/19 222 lb (100.7 kg)  07/06/19 224 lb (101.6 kg)     Constitutional: No acute distress Eyes: sclera non-icteric, normal conjunctiva and lids ENMT: normal dentition, moist mucous membranes Cardiovascular: regular rhythm, normal rate, no murmurs. S1 and S2 normal. Radial pulses normal bilaterally. No jugular venous distention.  Respiratory: clear to auscultation bilaterally GI : normal bowel sounds, soft and nontender. No distention.   MSK: extremities warm, well perfused.  Trace bilateral edema.  NEURO: grossly nonfocal exam, moves all extremities. PSYCH: alert and oriented x 3, normal mood and affect.   ASSESSMENT:    1. Dyspnea on exertion   2. Snoring   3. Dyspnea, unspecified type   4. Shortness of breath   5. Bilateral leg edema   6. Dilatation of aortic sinus of Valsalva   7. History of breast cancer   8.  Hypothyroidism, unspecified type   9. Type 2 diabetes mellitus with complication, without long-term current use of insulin (HCC)    PLAN:    Snoring Dyspnea, unspecified type Shortness of breath Dyspnea on exertion - Split night study to evaluate for sleep apnea  Bilateral leg edema - continue lasix and compression stockings  Dilatation of aortic sinus of Valsalva - stable on last check, evaluate in 1 year with echo  History of breast cancer  Hypothyroidism, unspecified type Type 2 diabetes mellitus with complication, without long-term current use of insulin (HCC) - Per pcp   Total time of encounter: 30 minutes total time of encounter, including 21 minutes spent in face-to-face patient care on the date of this encounter. This time includes coordination of care and counseling regarding above mentioned problem list. Remainder of non-face-to-face time involved reviewing chart documents/testing relevant to the patient encounter and documentation in the medical record. I have independently reviewed documentation from referring provider.   Cherlynn Kaiser, MD [place]  [facility]    Medication Adjustments/Labs and Tests Ordered: Current medicines are reviewed at length with the patient today.  Concerns regarding medicines are outlined above.  Orders Placed This Encounter  Procedures  . EKG 12-Lead  . Split night study   No orders of the defined types were placed in this encounter.  Patient Instructions  Medication Instructions:  The current medical regimen is effective;  continue present plan and medications.  *If you need a refill on your cardiac medications before your next appointment, please call your pharmacy*   Testing/Procedures: Your physician has recommended that you have a sleep study. This test records several body functions during sleep, including: brain activity, eye movement, oxygen and carbon dioxide blood levels, heart rate and rhythm, breathing rate  and rhythm, the flow of air through your mouth and nose, snoring, body muscle movements, and chest and belly movement.   Follow-Up: At [facility], you and your health needs are our priority.  As part of our continuing mission to provide you with exceptional heart care, we have created designated Provider Care Teams.  These Care Teams include your primary Cardiologist (physician) and Advanced Practice Providers (APPs -  Physician Assistants and Nurse Practitioners) who all work together to provide you with the care you need, when you need it.  We recommend signing up for the patient portal called "MyChart".  Sign up information is provided on this After Visit Summary.  MyChart is used to connect with patients for Virtual Visits (Telemedicine).  Patients are able to view lab/test results, encounter notes, upcoming appointments, etc.  Non-urgent messages can be sent to your provider as well.   To learn more about what you can do with MyChart, go to [email or web address].    Your next appointment:   6 month(s)  The format for your next appointment:   In Person  Provider:   Cherlynn Kaiser, MD

## 2019-09-11 NOTE — Patient Instructions (Signed)
Medication Instructions:  The current medical regimen is effective;  continue present plan and medications.  *If you need a refill on your cardiac medications before your next appointment, please call your pharmacy*   Testing/Procedures: Your physician has recommended that you have a sleep study. This test records several body functions during sleep, including: brain activity, eye movement, oxygen and carbon dioxide blood levels, heart rate and rhythm, breathing rate and rhythm, the flow of air through your mouth and nose, snoring, body muscle movements, and chest and belly movement.   Follow-Up: At [facility], you and your health needs are our priority.  As part of our continuing mission to provide you with exceptional heart care, we have created designated Provider Care Teams.  These Care Teams include your primary Cardiologist (physician) and Advanced Practice Providers (APPs -  Physician Assistants and Nurse Practitioners) who all work together to provide you with the care you need, when you need it.  We recommend signing up for the patient portal called "MyChart".  Sign up information is provided on this After Visit Summary.  MyChart is used to connect with patients for Virtual Visits (Telemedicine).  Patients are able to view lab/test results, encounter notes, upcoming appointments, etc.  Non-urgent messages can be sent to your provider as well.   To learn more about what you can do with MyChart, go to [email or web address].    Your next appointment:   6 month(s)  The format for your next appointment:   In Person  Provider:   Cherlynn Kaiser, MD

## 2019-09-25 ENCOUNTER — Other Ambulatory Visit: Payer: Self-pay | Admitting: Family

## 2019-09-25 DIAGNOSIS — Z1231 Encounter for screening mammogram for malignant neoplasm of breast: Secondary | ICD-10-CM

## 2019-09-29 ENCOUNTER — Other Ambulatory Visit: Payer: Self-pay

## 2019-10-03 ENCOUNTER — Ambulatory Visit: Payer: Medicare Other | Admitting: Endocrinology

## 2019-10-03 DIAGNOSIS — E119 Type 2 diabetes mellitus without complications: Secondary | ICD-10-CM

## 2019-10-04 ENCOUNTER — Telehealth: Payer: Self-pay | Admitting: Family

## 2019-10-04 MED ORDER — LEVOTHYROXINE SODIUM 200 MCG PO TABS: 200.0000 ug | ORAL_TABLET | Freq: Every day | ORAL | 0 refills | Status: DC

## 2019-10-04 NOTE — Telephone Encounter (Signed)
New message:   1.Medication Requested: levothyroxine (SYNTHROID) 200 MCG tablet 2. Pharmacy (Name, [place]): [facility] [place], [place] [address] 3. On Med List: Yes  4. Last Visit with PCP: 06/16/19  5. Next visit date with PCP: none  Need a new prescription sent. Agent: Please be advised that RX refills may take up to 3 business days. We ask that you follow-up with your pharmacy.

## 2019-10-11 ENCOUNTER — Other Ambulatory Visit: Payer: Self-pay

## 2019-10-13 ENCOUNTER — Other Ambulatory Visit: Payer: Self-pay

## 2019-10-13 ENCOUNTER — Ambulatory Visit (INDEPENDENT_AMBULATORY_CARE_PROVIDER_SITE_OTHER): Payer: Medicare Other | Admitting: Endocrinology

## 2019-10-13 ENCOUNTER — Encounter: Payer: Self-pay | Admitting: Endocrinology

## 2019-10-13 VITALS — BP 120/70 | HR 91 | Ht 67.0 in | Wt 216.0 lb

## 2019-10-13 DIAGNOSIS — E042 Nontoxic multinodular goiter: Secondary | ICD-10-CM

## 2019-10-13 DIAGNOSIS — E119 Type 2 diabetes mellitus without complications: Secondary | ICD-10-CM

## 2019-10-13 LAB — POCT GLYCOSYLATED HEMOGLOBIN (HGB A1C): Hemoglobin A1C: 5.8 % — AB (ref 4.0–5.6)

## 2019-10-13 MED ORDER — LEVOTHYROXINE SODIUM 175 MCG PO TABS: 175.0000 ug | ORAL_TABLET | Freq: Every day | ORAL | 3 refills | Status: DC

## 2019-10-13 NOTE — Progress Notes (Signed)
Subjective:    Patient ID: [name], female    DOB: Nov 10, 1945, 74 y.o.   MRN: [id number]  HPI Pt returns for f/u of diabetes mellitus: DM type: 2 Dx'ed: 0000000 Complications: renal insuff Therapy: no medication recently GDM: never DKA: never Severe hypoglycemia: never Pancreatitis: never Pancreatic imaging: normal on 2020 CT.  SDOH: none Other: she has never been on insulin. Interval history: pt states she feels well in general. Pt also has MNG (dx'ed 2004: she has never had bx; when she developed hyperthyroidism in 2010, she had RAI rx; she has been on synthroid since then; f/u US in 2016 was smaller).  Pt says she believes the synthroid is 0000000, but she is uncertain.   Pt says she wants to have a biopsy of the left sided nodule.   Past Medical History:  Diagnosis Date  . Allergy   . ANEMIA, IRON DEFICIENCY 05/08/2009  . Angina   . ASYMPTOMATIC POSTMENOPAUSAL STATUS 10/11/2008  . Blood transfusion   . Blood transfusion without reported diagnosis   . Breast cancer ([facility]) 09/29/11   invasive grade III ductal ca,assoc high grade dcis,ER/PR=neg  . C. difficile colitis   . Cataract   . Diverticulosis of colon (without mention of hemorrhage)   . Esophageal reflux 06/12/2008  . Gastroparesis   . GOITER, MULTINODULAR 04/02/2009  . Gout, unspecified   . H/O hiatal hernia   . History of lower GI bleeding   . History of radiation therapy 02/08/12-03/25/12   left breast,total 61gy  . Hypokalemia 05/11/2013  . Hypomagnesemia   . HYPOTHYROIDISM, POST-RADIATION 08/13/2009  . Internal hemorrhoids without mention of complication   . Kidney stones    "several"  . Leukopenia   . Migraines   . Obesity   . Osteoarthrosis, unspecified whether generalized or localized, unspecified site   . Other and unspecified hyperlipidemia   . Personal history of chemotherapy 2013  . Personal history of radiation therapy 2013   left  . PONV (postoperative nausea and vomiting)   . PUD (peptic  ulcer disease)   . Short bowel syndrome   . Shortness of breath on exertion    "sometimes"  . Stricture and stenosis of esophagus   . Thyrotoxicosis without mention of goiter or other cause, without mention of thyrotoxic crisis or storm   . Type II or unspecified type diabetes mellitus without mention of complication, not stated as uncontrolled    no med in years diet controled  . Unspecified essential hypertension   . UTI (urinary tract infection)   . Varicose veins   . VITAMIN B12 DEFICIENCY 08/30/2009    Past Surgical History:  Procedure Laterality Date  . ABDOMINAL ADHESION SURGERY  1980's thru 1990's   "several"  . ABDOMINAL HYSTERECTOMY  1970's   with BSO  . BREAST BIOPSY Left 08/13/11   left breast lower inner quadrant  . BREAST BIOPSY Right 1985   Rt exc bx, benign  . BREAST LUMPECTOMY Left 08/2011  . BREAST LUMPECTOMY W/ NEEDLE LOCALIZATION  09/29/11   left  breast=lymph node,excision benign/ ER/PR=neg, her 2 Positive  . BUNIONECTOMY  1970's   bilateral  . CHOLECYSTECTOMY  1990's  . COLON SURGERY     "several surgeries for short bowel syndrome"  . COLONOSCOPY  2012   multiple   . DILATION AND CURETTAGE OF UTERUS    . ESOPHAGOGASTRODUODENOSCOPY  2011   multiple   . EXCISIONAL HEMORRHOIDECTOMY  11/10/2016  . EYE SURGERY     "long  time ago"  . FLEXIBLE SIGMOIDOSCOPY  2011   multiple   . KIDNEY STONE SURGERY  1990's   "tried to go up & get it but pushed it further up"  . LITHOTRIPSY     "4 or 5 times"  . MASTECTOMY W/ NODES PARTIAL  09/29/11   left  . PORT-A-CATH REMOVAL Right 12/19/2013   Procedure: MINOR REMOVAL PORT-A-CATH;  Surgeon: Adin Hector, MD;  Location: [facility];  Service: General;  Laterality: Right;  . PORTACATH PLACEMENT  09/29/2011   Procedure: INSERTION PORT-A-CATH;  Surgeon: Adin Hector, MD;  Location: [facility];  Service: General;  Laterality: N/A;  . SMALL INTESTINE SURGERY    . Thyroid Ultrasound  12/1994 and 12/1995  .  TOTAL KNEE ARTHROPLASTY Right 06/05/2015   Procedure: RIGHT TOTAL KNEE ARTHROPLASTY;  Surgeon: Ninetta Lights, MD;  Location: [facility];  Service: Orthopedics;  Laterality: Right;  . UPPER GASTROINTESTINAL ENDOSCOPY    . VEIN LIGATION AND STRIPPING  1980's   Right leg    Social History   Socioeconomic History  . Marital status: Widowed    Spouse name: Not on file  . Number of children: 2  . Years of education: Not on file  . Highest education level: Not on file  Occupational History  . Occupation: RETIRED  Tobacco Use  . Smoking status: Former Smoker    Packs/day: 1.00    Years: 10.00    Pack years: 10.00    Types: Cigarettes    Quit date: 09/22/1985    Years since quitting: 34.0  . Smokeless tobacco: Never Used  Substance and Sexual Activity  . Alcohol use: No    Comment: 09/29/11 "used to drink socially years ago"  . Drug use: No  . Sexual activity: Yes    Comment: 1st intercourse- 15, partners- 50, boyfriend- 1.8 yrs  Other Topics Concern  . Not on file  Social History Narrative   Pt gets regular exercise   Social Determinants of Health   Financial Resource Strain:   . Difficulty of Paying Living Expenses:   Food Insecurity:   . Worried About Charity fundraiser in the Last Year:   . Arboriculturist in the Last Year:   Transportation Needs:   . Film/video editor (Medical):   Marland Kitchen Lack of Transportation (Non-Medical):   Physical Activity:   . Days of Exercise per Week:   . Minutes of Exercise per Session:   Stress:   . Feeling of Stress :   Social Connections:   . Frequency of Communication with Friends and Family:   . Frequency of Social Gatherings with Friends and Family:   . Attends Religious Services:   . Active Member of Clubs or Organizations:   . Attends Archivist Meetings:   Marland Kitchen Marital Status:   Intimate Partner Violence:   . Fear of Current or Ex-Partner:   . Emotionally Abused:   Marland Kitchen Physically Abused:   . Sexually Abused:     Current  Outpatient Medications on File Prior to Visit  Medication Sig Dispense Refill  . allopurinol (ZYLOPRIM) 100 MG tablet Take 1 tablet (100 mg total) by mouth daily. 90 tablet 3  . amitriptyline (ELAVIL) 50 MG tablet Take 1 tablet (50 mg total) by mouth at bedtime. 90 tablet 3  . calcium carbonate (TUMS - DOSED IN MG ELEMENTAL CALCIUM) 500 MG chewable tablet Chew 1 tablet by mouth as needed for indigestion or heartburn.    Marland Kitchen  Calcium Carbonate-Vitamin D (CALCIUM-VITAMIN D) 600-200 MG-UNIT CAPS Take 1 capsule by mouth daily.      . Cyanocobalamin (VITAMIN B 12 PO) Take 1 tablet by mouth 2 (two) times daily.     . cyclobenzaprine (FLEXERIL) 5 MG tablet Take 1-2 tablets (5-10 mg total) by mouth 3 (three) times daily as needed for muscle spasms. Take mostly at bedtime. 30 tablet 1  . dicyclomine (BENTYL) 20 MG tablet Take 1 tablet (20 mg total) by mouth 3 (three) times daily as needed for spasms. 360 tablet 3  . diphenoxylate-atropine (LOMOTIL) 2.5-0.025 MG tablet Take 1 tablet by mouth 3 times daily as needed for diarrhea for >3-4 stool /day. 270 tablet 0  . famotidine (PEPCID) 40 MG tablet Take 1 tablet (40 mg total) by mouth at bedtime. 90 tablet 3  . ferrous sulfate 325 (65 FE) MG tablet Take 325 mg by mouth daily with breakfast.    . fluticasone (FLONASE) 50 MCG/ACT nasal spray Place 2 sprays into both nostrils daily. (Patient taking differently: Place 2 sprays into both nostrils daily as needed. ) 16 g 6  . Fluticasone-Umeclidin-Vilant (TRELEGY ELLIPTA) 100-62.5-25 MCG/INH AEPB Inhale 1 puff into the lungs daily. 28 each 0  . gentamicin cream (GARAMYCIN) 0.1 % Apply 1 application topically 2 (two) times daily. 15 g 1  . loratadine (CLARITIN) 10 MG tablet Take 1 tablet (10 mg total) by mouth daily. 30 tablet 11  . lovastatin (MEVACOR) 20 MG tablet TAKE 1 TABLET BY MOUTH  DAILY AT 6 PM. 90 tablet 2  . Magnesium 500 MG CAPS Take 1 mg by mouth 2 (two) times daily.     . methocarbamol (ROBAXIN) 500 MG  tablet Take 1 tablet (500 mg total) by mouth every 8 (eight) hours as needed for muscle spasms. 30 tablet 0  . metoCLOPramide (REGLAN) 5 MG tablet Take 1 tablet (5 mg total) by mouth every 8 (eight) hours as needed for nausea. 15 tablet 0  . nitroGLYCERIN (NITROSTAT) 0.4 MG SL tablet Place 1 tablet (0.4 mg total) under the tongue every 5 (five) minutes as needed. 25 tablet 6  . pantoprazole (PROTONIX) 40 MG tablet Take 1 tablet (40 mg total) by mouth daily before breakfast. TAKE 1 TABLET BY MOUTH 30 MINUTES PRIOR TO BREAKFAST AND SUPPER 90 tablet 1  . sucralfate (CARAFATE) 1 g tablet Please crush 1 gram tablet and mix with water to make it into a slurry. Use 2 times daily for 2 weeks. 28 tablet 0  . Thiamine HCl (THIAMINE PO) Take 1 tablet by mouth daily.     . furosemide (LASIX) 40 MG tablet Take 1 tablet (40 mg total) by mouth daily. 90 tablet 3   Current Facility-Administered Medications on File Prior to Visit  Medication Dose Route Frequency Provider Last Rate Last Admin  . acetaminophen (TYLENOL) tablet 1,000 mg  1,000 mg Oral Once Amada Kingfisher, MD        Allergies  Allergen Reactions  . Aspirin Other (See Comments)    REACTION: Gi Intolerance/ Burning in stomach  . Iodinated Diagnostic Agents Itching  . Trazodone And Nefazodone     "sick"  . Flagyl [Metronidazole] Rash  . Iodine Itching    Allergic to IVP dye  . Morphine And Related Itching    Family History  Problem Relation Age of Onset  . Esophageal cancer Son        deceased  . Breast cancer Son        esophageal  .  Diabetes Mother   . Heart disease Mother   . Kidney disease Sister   . Diabetes Father   . Hypertension Father   . Kidney disease Brother        x 3  . Colon cancer Paternal Uncle   . Breast cancer Maternal Aunt   . Breast cancer Paternal Aunt   . Breast cancer Cousin        Pt states she has 15+ cousins w/ Breast CA  . Rectal cancer Neg Hx   . Stomach cancer Neg Hx     BP 120/70   Pulse 91   Ht  5\' 7"  (1.702 m)   Wt 216 lb (98 kg)   SpO2 97%   BMI 33.83 kg/m   Review of Systems     Objective:   Physical Exam VITAL SIGNS:  See vs page GENERAL: no distress NECK: There is no palpable thyroid enlargement.  No thyroid nodule is palpable.  No palpable lymphadenopathy at the anterior neck.   Pulses: dorsalis pedis intact bilat.   MSK: no deformity of the feet CV: no leg edema Skin:  no ulcer on the feet.  normal color and temp on the feet. Neuro: sensation is intact to touch on the feet Ext: there is bilateral onychomycosis of the toenails.    Lab Results  Component Value Date   TSH 0.08 (L) 08/17/2019   T4TOTAL 8.1 07/29/2007    A1c=5.8%    Assessment & Plan:  MNG, persistent. We discussed low risk, but she requests bx.  Hypothyroidism: overreplaced. Type 2 DM: stable off medication.  Patient Instructions  Let's check the biopsy, guided by the ultrasound.  you will receive a phone call, about a day and time for an appointment. I have sent a prescription to your pharmacy, to reduce the levothyroxine. Please come back for a follow-up appointment in 3 months.

## 2019-10-13 NOTE — Patient Instructions (Signed)
Let's check the biopsy, guided by the ultrasound.  you will receive a phone call, about a day and time for an appointment. I have sent a prescription to your pharmacy, to reduce the levothyroxine. Please come back for a follow-up appointment in 3 months.

## 2019-10-16 ENCOUNTER — Other Ambulatory Visit: Payer: Self-pay

## 2019-10-16 ENCOUNTER — Ambulatory Visit (INDEPENDENT_AMBULATORY_CARE_PROVIDER_SITE_OTHER): Payer: Medicare Other | Admitting: Obstetrics & Gynecology

## 2019-10-16 ENCOUNTER — Encounter: Payer: Self-pay | Admitting: Obstetrics & Gynecology

## 2019-10-16 VITALS — BP 130/80

## 2019-10-16 DIAGNOSIS — L723 Sebaceous cyst: Secondary | ICD-10-CM

## 2019-10-16 DIAGNOSIS — N898 Other specified noninflammatory disorders of vagina: Secondary | ICD-10-CM

## 2019-10-16 LAB — WET PREP FOR TRICH, YEAST, CLUE

## 2019-10-16 NOTE — Progress Notes (Addendum)
    [name] [birth year] [id number]        74 y.o.  [id number] Widowed  RP: Vaginal discharge with odor and itching on-off for many months  HPI:  Better after using Monistat.  No odor or itching currently.  No pelvic pain.  C/O frequent sebaceous cysts on vulva.  Abstinent.  S/P TAH/BSO.   OB History  Gravida Para Term Preterm AB Living  4 3     1 2   SAB TAB Ectopic Multiple Live Births  1            # Outcome Date GA Lbr Len/2nd Weight Sex Delivery Anes PTL Lv  4 SAB           3 Para           2 Para           1 Para             Obstetric Comments  Oldest son passed away in 09-05-06 from esophageal cancer    Past medical history,surgical history, problem list, medications, allergies, family history and social history were all reviewed and documented in the EPIC chart.   Directed ROS with pertinent positives and negatives documented in the history of present illness/assessment and plan.  Exam:  Vitals:   10/16/19 1420  BP: 130/80   General appearance:  Normal   Gynecologic exam: Vulva normal.  Speculum:  Vagina normal.  Mild vaginal discharge.  Wet prep done.  Wet prep Negative   Assessment/Plan:  74 y.o. [id number]   1. Vaginal discharge Wet prep negative.  Patient reassured.  Recommend prevention/Treatment with Boric Acid once a month.  - WET PREP FOR Kelly, YEAST, CLUE  2. Sebaceous cyst None today.  Recommend warm soaking daily to prevent/treat.  Princess Bruins MD, 2:45 PM 10/16/2019

## 2019-10-16 NOTE — Patient Instructions (Signed)
1. Vaginal discharge Wet prep negative.  Patient reassured.  Recommend prevention/Treatment with Boric Acid once a month.  - WET PREP FOR [name], YEAST, CLUE  2. Sebaceous cyst None today.  Recommend warm soaking daily to prevent/treat.  [name], it was a pleasure seeing you today!

## 2019-10-19 IMAGING — DX DG SHOULDER 2+V*L*
3 series · 3 of 3 positions shown · non-contrast
Comparison: 05/21/2008.

CLINICAL DATA: Left-sided shoulder and neck pain worsening over the
last 4 days.

EXAM:
LEFT SHOULDER - 2+ VIEW

[shoulder grashey]
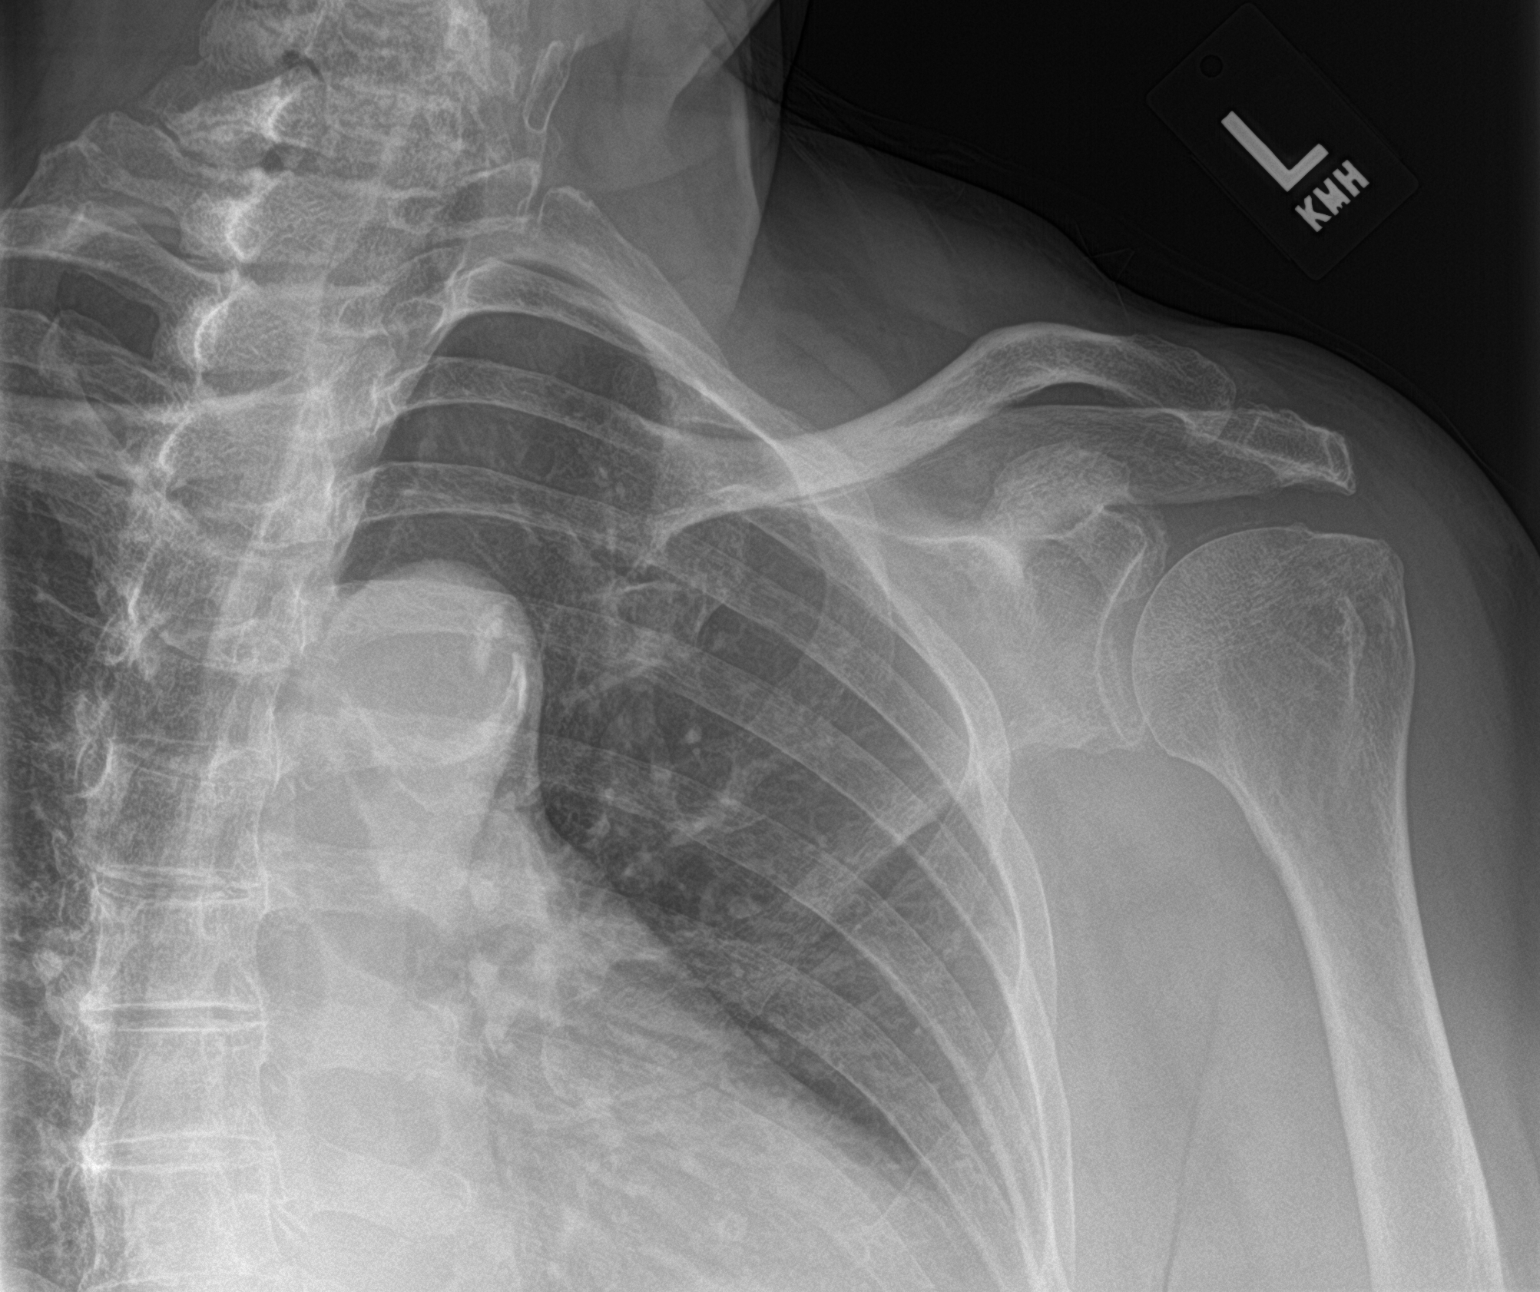

[shoulder y view]
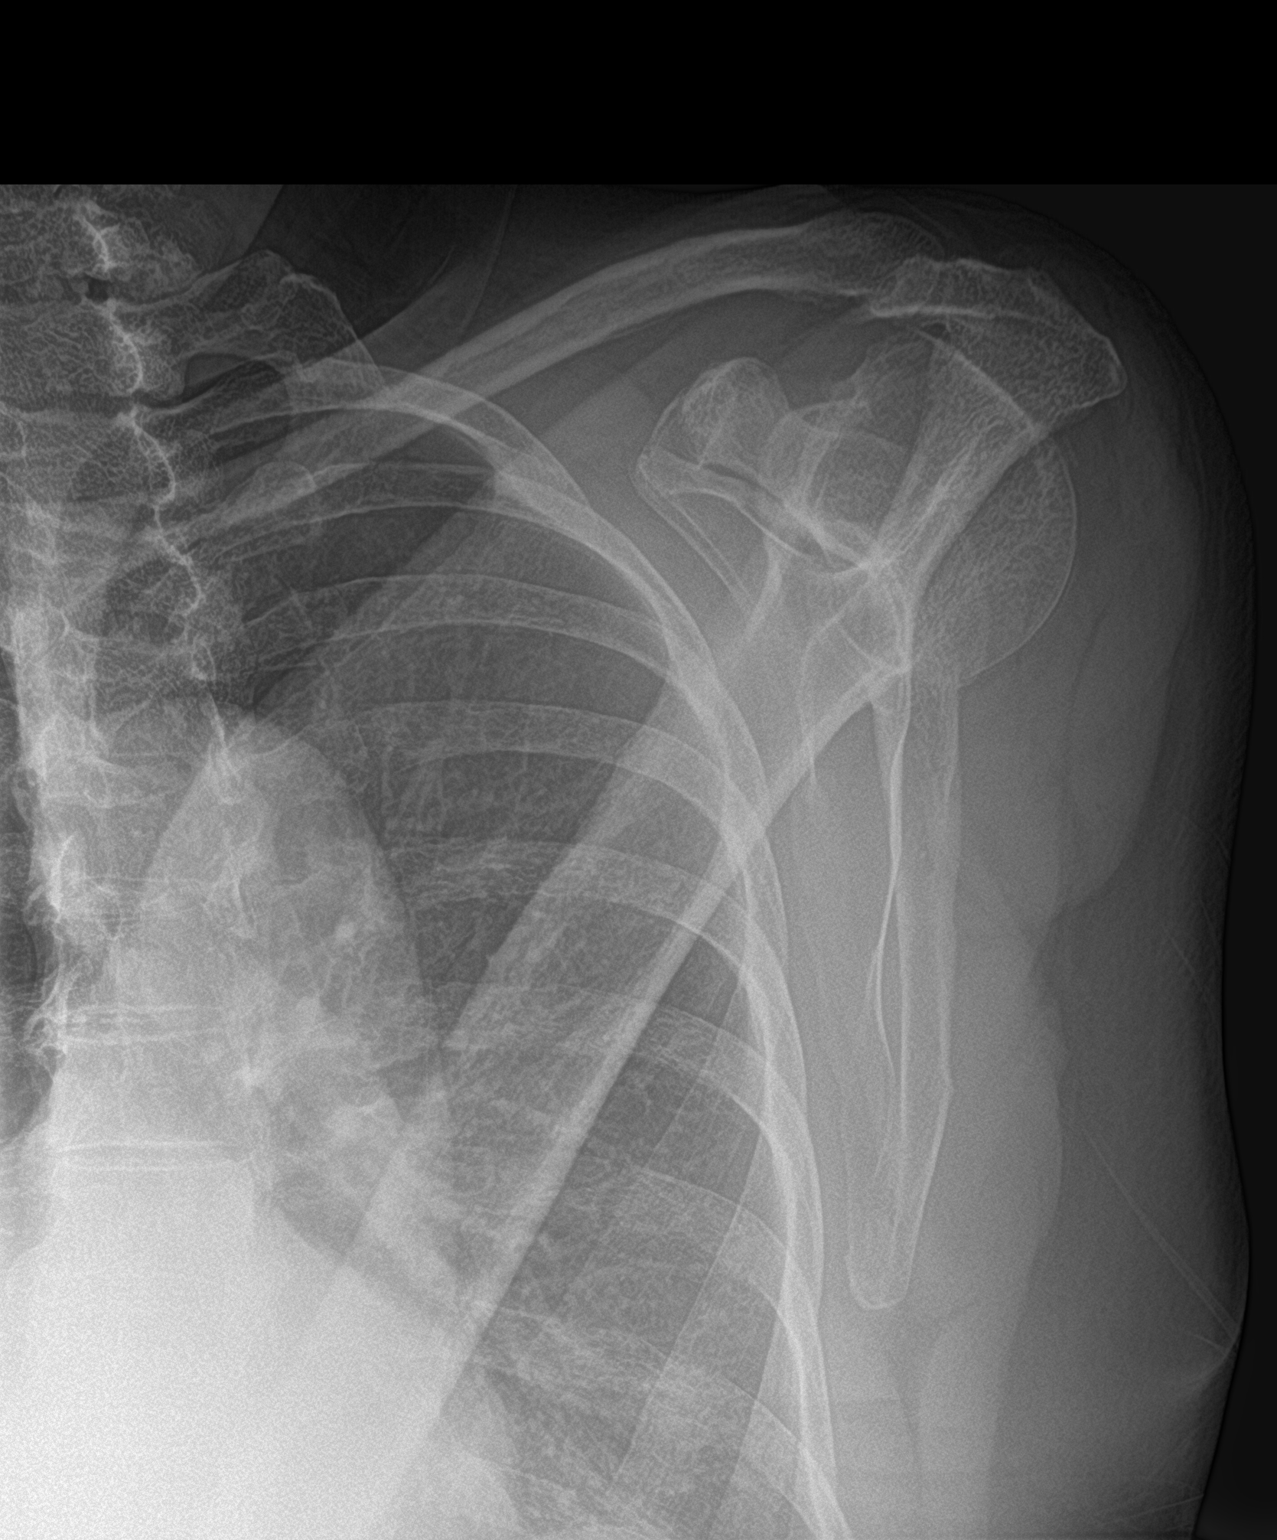

[shoulder axillary]
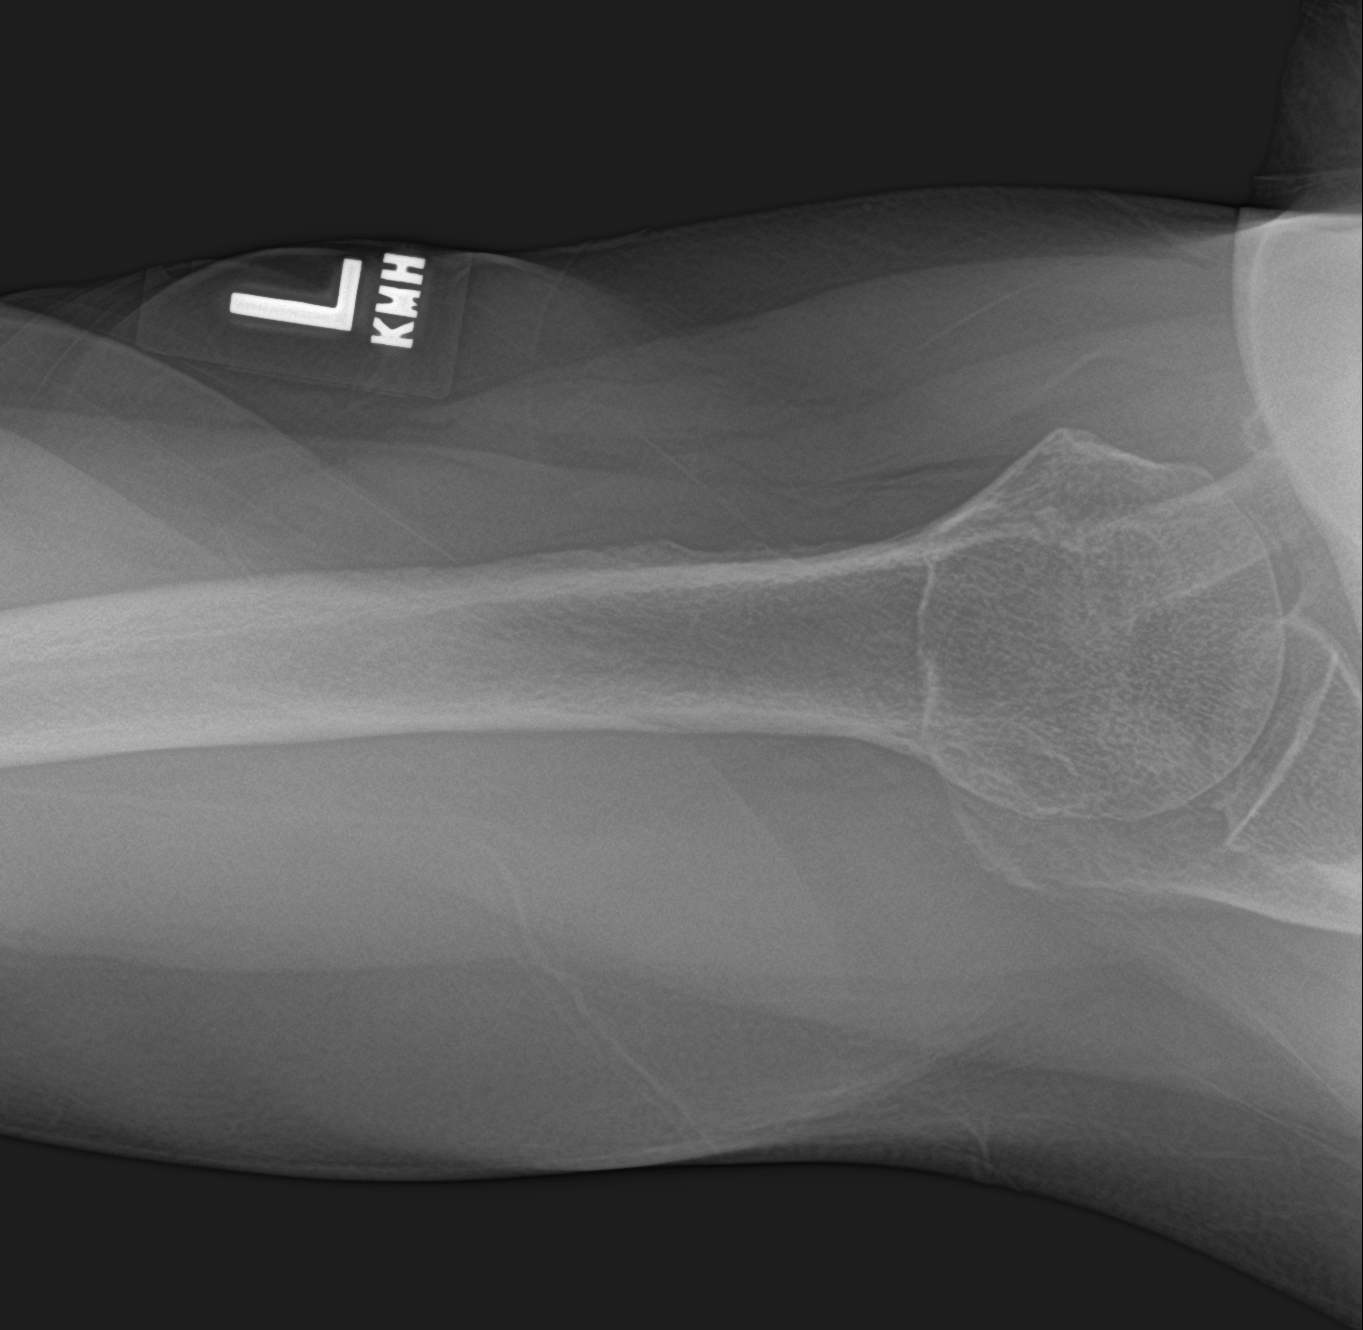

[3 of 3 positions shown; findings below may reference images not displayed]

FINDINGS: Glenohumeral joint does not show any degenerative narrowing or
osteophyte formation. Ordinary age related chondrocalcinosis of the
articular cartilage. Subacromial spur could predispose to rotator
cuff disease. AC joint appears normal. Regional ribs appear normal.
IMPRESSION: Subacromial spur which could predispose to rotator cuff disease.
Ordinary age related chondrocalcinosis without significant
glenohumeral degenerative changes by radiography.

## 2019-10-20 ENCOUNTER — Ambulatory Visit (INDEPENDENT_AMBULATORY_CARE_PROVIDER_SITE_OTHER): Payer: Medicare Other | Admitting: Family Medicine

## 2019-10-20 ENCOUNTER — Encounter: Payer: Self-pay | Admitting: Family Medicine

## 2019-10-20 ENCOUNTER — Other Ambulatory Visit: Payer: Self-pay

## 2019-10-20 VITALS — BP 104/76 | HR 73 | Ht 67.0 in | Wt 207.6 lb

## 2019-10-20 DIAGNOSIS — G5702 Lesion of sciatic nerve, left lower limb: Secondary | ICD-10-CM

## 2019-10-20 DIAGNOSIS — M7062 Trochanteric bursitis, left hip: Secondary | ICD-10-CM

## 2019-10-20 MED ORDER — PREGABALIN 75 MG PO CAPS: 75.0000 mg | ORAL_CAPSULE | Freq: Two times a day (BID) | ORAL | 3 refills | Status: DC

## 2019-10-20 MED ORDER — HYDROCODONE-ACETAMINOPHEN 5-325 MG PO TABS: 1.0000 | ORAL_TABLET | Freq: Four times a day (QID) | ORAL | 0 refills | Status: DC | PRN

## 2019-10-20 NOTE — Patient Instructions (Signed)
Thank you for coming in today. Plan for PT.  Use lyrica for nerve pain down your leg.  Use hydrocodone for severe pain.  Recheck if not improving.  Call or go to the ER if you develop a large red swollen joint with extreme pain or oozing puss.    Hip Bursitis  Hip bursitis is swelling of a fluid-filled sac (bursa) in your hip joint. This swelling (inflammation) can be painful. This condition may come and go over time. What are the causes?  Injury to the hip.  Overuse of the muscles that surround the hip joint.  An earlier injury or surgery of the hip.  Arthritis or gout.  Diabetes.  Thyroid disease.  Infection.  In some cases, the cause may not be known. What are the signs or symptoms?  Mild or moderate pain in the hip area. Pain may get worse with movement.  Tenderness and swelling of the hip, especially on the outer side of the hip.  In rare cases, the bursa may become infected. This may cause: ? A fever. ? Warmth and redness in the area. Symptoms may come and go. How is this treated? This condition is treated by resting, icing, applying pressure (compression), and raising (elevating) the injured area. You may hear this called the RICE treatment. Treatment may also include:  Using crutches.  Draining fluid out of the bursa to help relieve swelling.  Giving a shot of (injecting) medicine that helps to reduce swelling (cortisone).  Other medicines if the bursa is infected. Follow these instructions at home: Managing pain, stiffness, and swelling   If told, put ice on the painful area. ? Put ice in a plastic bag. ? Place a towel between your skin and the bag. ? Leave the ice on for 20 minutes, 2-3 times a day. ? Raise (elevate) your hip above the level of your heart as much as you can without pain. To do this, try putting a pillow under your hips while you lie down. Stop if this causes pain. Activity  Return to your normal activities as told by your doctor.  Ask your doctor what activities are safe for you.  Rest and protect your hip as much as you can until you feel better. General instructions  Take over-the-counter and prescription medicines only as told by your doctor.  Wear wraps that put pressure on your hip (compression wraps) only as told by your doctor.  Do not use your hip to support your body weight until your doctor says that you can.  Use crutches as told by your doctor.  Gently rub and stretch your injured area as often as is comfortable.  Keep all follow-up visits as told by your doctor. This is important. How is this prevented?  Exercise regularly, as told by your doctor.  Warm up and stretch before being active.  Cool down and stretch after being active.  Avoid activities that bother your hip or cause pain.  Avoid sitting down for long periods at a time. Contact a doctor if:  You have a fever.  You get new symptoms.  You have trouble walking.  You have trouble doing everyday activities.  You have pain that gets worse.  You have pain that does not get better with medicine.  You get red skin on your hip area.  You get a feeling of warmth in your hip area. Get help right away if:  You cannot move your hip.  You have very bad pain. Summary  Hip  bursitis is swelling of a fluid-filled sac (bursa) in your hip.  Hip bursitis can be painful.  Symptoms often come and go over time.  This condition is treated with rest, ice, compression, elevation, and medicines. This information is not intended to replace advice given to you by your health care provider. Make sure you discuss any questions you have with your health care provider. Document Revised: 01/24/2018 Document Reviewed: 01/24/2018 Elsevier Patient Education  2020 [place].   Piriformis Syndrome  Piriformis syndrome is a condition that can cause pain and numbness in your buttocks and down the back of your leg. Piriformis syndrome happens  when the small muscle that connects the base of your spine to your hip (piriformis muscle) presses on the nerve that runs down the back of your leg (sciatic nerve). The piriformis muscle helps your hip rotate and helps to bring your leg back and out. It also helps shift your weight to keep you stable while you are walking. The sciatic nerve runs under or through the piriformis muscle. Damage to the piriformis muscle can cause spasms that put pressure on the nerve below. This causes pain and discomfort while sitting and moving. The pain may feel as if it begins in the buttock and spreads (radiates) down your hip and thigh. What are the causes? This condition is caused by pressure on the sciatic nerve from the piriformis muscle. The piriformis muscle can get irritated with overuse, especially if other hip muscles are weak and the piriformis muscle has to do extra work. Piriformis syndrome can also occur after an injury, like a fall onto your buttocks. What increases the risk? You are more likely to develop this condition if you:  Are a woman.  Sit for long periods of time.  Are a cyclist.  Have weak buttocks muscles (gluteal muscles). What are the signs or symptoms? Symptoms of this condition include:  Pain, tingling, or numbness that starts in the buttock and runs down the back of your leg (sciatica).  Pain in the groin or thigh area. Your symptoms may get worse:  The longer you sit.  When you walk, run, or climb stairs.  When straining to have a bowel movement. How is this diagnosed? This condition is diagnosed based on your symptoms, medical history, and physical exam.  During the exam, your health care provider may: ? Move your leg into different positions to check for pain. ? Press on the muscles of your hip and buttock to see if that increases your symptoms.  You may also have tests, including: ? Imaging tests such as X-rays, MRI, or ultrasound. ? Electromyogram (EMG). This  test measures electrical signals sent by your nerves into the muscles. ? Nerve conduction study. This test measures how well electrical signals pass through your nerves. How is this treated? This condition may be treated by:  Stopping all activities that cause pain or make your condition worse.  Applying ice or using heat therapy.  Taking medicines to reduce pain and swelling.  Taking a muscle relaxer (muscle relaxant) to stop muscle spasms.  Doing range-of-motion and strengthening exercises (physical therapy) as told by your health care provider.  Massaging the area.  Having acupuncture.  Getting an injection of medicine in the piriformis muscle. Your health care provider will choose the medicine based on your condition. He or she may inject: ? An anti-inflammatory medicine (steroid) to reduce swelling. ? A numbing medicine (local anesthetic) to block the pain. ? Botulinum toxin. The toxin blocks  nerve impulses to specific muscles to reduce muscle tension. In rare cases, you may need surgery to cut the muscle and release pressure on the nerve if other treatments do not work. Follow these instructions at home: Activity  Do not sit for long periods. Get up and walk around every 20 minutes or as often as told by your health care provider. ? When driving long distances, make sure to take frequent stops to get up and stretch.  Use a cushion when you sit on hard surfaces.  Do exercises as told by your health care provider.  Return to your normal activities as told by your health care provider. Ask your health care provider what activities are safe for you. Managing pain, stiffness, and swelling      If directed, apply heat to the affected area as often as told by your health care provider. Use the heat source that your health care provider recommends, such as a moist heat pack or a heating pad. ? Place a towel between your skin and the heat source. ? Leave the heat on for 20-30  minutes. ? Remove the heat if your skin turns bright red. This is especially important if you are unable to feel pain, heat, or cold. You may have a greater risk of getting burned.  If directed, put ice on the injured area. ? Put ice in a plastic bag. ? Place a towel between your skin and the bag. ? Leave the ice on for 20 minutes, 2-3 times a day. General instructions  Take over-the-counter and prescription medicines only as told by your health care provider.  Ask your health care provider if the medicine prescribed to you requires you to avoid driving or using heavy machinery.  You may need to take actions to prevent or treat constipation, such as: ? Drink enough fluid to keep your urine pale yellow. ? Take over-the-counter or prescription medicines. ? Eat foods that are high in fiber, such as beans, whole grains, and fresh fruits and vegetables. ? Limit foods that are high in fat and processed sugars, such as fried or sweet foods.  Keep all follow-up visits as told by your health care provider. This is important. How is this prevented?  Do not sit for longer than 20 minutes at a time. When you sit, choose padded surfaces.  Warm up and stretch before being active.  Cool down and stretch after being active.  Give your body time to rest between periods of activity.  Make sure to use equipment that fits you.  Maintain physical fitness, including: ? Strength. ? Flexibility. Contact a health care provider if:  Your pain and stiffness continue or get worse.  Your leg or hip becomes weak.  You have changes in your bowel function or bladder function. Summary  Piriformis syndrome is a condition that can cause pain, tingling, and numbness in your buttocks and down the back of your leg.  You may try applying heat or ice to relieve the pain.  Do not sit for long periods. Get up and walk around every 20 minutes or as often as told by your health care provider. This information  is not intended to replace advice given to you by your health care provider. Make sure you discuss any questions you have with your health care provider. Document Revised: 09/08/2018 Document Reviewed: 01/12/2018 Elsevier Patient Education  [place].

## 2019-10-20 NOTE — Progress Notes (Signed)
I, [name], LAT, ATC, am serving as scribe for Dr. [name].  [name] is a 74 y.o. female who presents to [place] at [facility] today for L hip and L leg pain.  She was last seen by Dr. Georgina Snell on 08/14/19 for her neck.  Since her last visit, she has developed L hip and leg pain that she locates to her L post-lat hip that radiates into her L distal thigh to the lower leg.  She states that the pain began this morning when she woke up w/ no known MOI.  Radiating pain: Yes into L lateral thigh to the knee Low back pain: No L hip mechanical symptoms: No Aggravating factors: Walking; sit-to-stand transitions Treatments tried:  Heat   Pertinent review of systems: No fevers or chills  Relevant historical information: Hypertension, short gut syndrome, diabetes   Exam:  BP 104/76 (BP Location: Right Arm, Patient Position: Sitting, Cuff Size: Large)   Pulse 73   Ht 5\' 7"  (1.702 m)   Wt 207 lb 9.6 oz (94.2 kg)   SpO2 98%   BMI 32.51 kg/m  General: Well Developed, well nourished, and in no acute distress.   MSK: L-spine normal-appearing nontender. Decreased lumbar motion. Positive left-sided straight leg raise test. Reflexes are intact. Strength decreased left hip abduction. Significant guarding with leg motion limits accuracy of strength testing. Sensation is intact distally.  Left hip normal-appearing Tender palpation greater trochanter. Decreased hip motion. Significantly decreased hip abduction strength due to pain 3/5. Guarding with hip exam.   Hip greater trochanteric injection: left Consent obtained and timeout performed. Area of maximum tenderness palpated and identified. Skin cleaned with alcohol, cold spray applied. A spinal needle was used to access the greater trochanteric bursa. 40 mg of triamcinolone and 3 mL of Marcaine were used to inject the trochanteric bursa. Patient tolerated the procedure well.    Assessment and  Plan: 74 y.o. female with left leg pain multifactorial.  Predominant pain today is trochanteric bursitis or hip abductor tendinopathy.  Without history of fall or recent injury fracture is extremely unlikely. Plan for greater trochanter bursa injection as above today. Additionally pain could be due to piriformis syndrome or lumbosacral radiculopathy at S1.  We will treat radicular or radiating pain component with Lyrica.  Patient notes that she is tried gabapentin in the past and found it to be not helpful.  Additionally will use limited hydrocodone for severe pain. Refer to physical therapy to address hip abductor weakness and pain and probable piriformis syndrome.  Recheck back near future if not improving.    Orders Placed This Encounter  Procedures  . Ambulatory referral to Physical Therapy    Referral Priority:   Routine    Referral Type:   Physical Medicine    Referral Reason:   Specialty Services Required    Requested Specialty:   Physical Therapy   Meds ordered this encounter  Medications  . HYDROcodone-acetaminophen (NORCO/VICODIN) 5-325 MG tablet    Sig: Take 1 tablet by mouth every 6 (six) hours as needed.    Dispense:  15 tablet    Refill:  0  . pregabalin (LYRICA) 75 MG capsule    Sig: Take 1 capsule (75 mg total) by mouth 2 (two) times daily. For nerve pain    Dispense:  60 capsule    Refill:  3     Discussed warning signs or symptoms. Please see discharge instructions. Patient expresses understanding.   The above  documentation has been reviewed and is accurate and complete [name], M.D.

## 2019-10-24 ENCOUNTER — Ambulatory Visit: Payer: Medicare Other | Admitting: Family Medicine

## 2019-10-26 ENCOUNTER — Other Ambulatory Visit (HOSPITAL_COMMUNITY)
Admission: RE | Admit: 2019-10-26 | Discharge: 2019-10-26 | Disposition: A | Discharge status: Home / Self Care | Payer: Medicare Other | Source: Ambulatory Visit | Attending: Radiology | Admitting: Radiology

## 2019-10-26 ENCOUNTER — Ambulatory Visit
Admission: RE | Admit: 2019-10-26 | Discharge: 2019-10-26 | Disposition: A | Discharge status: Home / Self Care | Payer: Medicare Other | Source: Ambulatory Visit | Attending: Endocrinology | Admitting: Endocrinology

## 2019-10-26 DIAGNOSIS — E042 Nontoxic multinodular goiter: Secondary | ICD-10-CM

## 2019-10-26 DIAGNOSIS — E041 Nontoxic single thyroid nodule: Secondary | ICD-10-CM | POA: Diagnosis not present

## 2019-10-27 LAB — CYTOLOGY - NON PAP

## 2019-10-31 ENCOUNTER — Telehealth: Payer: Self-pay

## 2019-10-31 NOTE — Telephone Encounter (Signed)
Called pt and clarified Dr. Loanne Drilling message below and provided her with his NEW response. Verbalized acceptance and understanding.

## 2019-10-31 NOTE — Telephone Encounter (Signed)
What I meant was that we would recheck the [place] in approx 6 months.

## 2019-10-31 NOTE — Telephone Encounter (Signed)
RESULTS  Results were reviewed by Dr. Loanne Drilling. Called pt to inform about results and need to order [place]. Using closed-loop communication, pt verbalized complete acceptance and understanding of all information provided and is agreeable to ordering [place]. Aware she will receive a call about the date/time of the [place] appt. No further questions nor concerns were voiced at this time.

## 2019-10-31 NOTE — Telephone Encounter (Signed)
-----   Message from Renato Shin, MD sent at 10/27/2019  4:54 PM EDT ----- please contact patient: They can't tell for sure about the biopsy result.  We should follow the ultrasound.  OK with you?

## 2019-11-03 ENCOUNTER — Other Ambulatory Visit: Payer: Self-pay

## 2019-11-03 ENCOUNTER — Ambulatory Visit
Admission: RE | Admit: 2019-11-03 | Discharge: 2019-11-03 | Disposition: A | Discharge status: Home / Self Care | Payer: Medicare Other | Source: Ambulatory Visit | Attending: Family | Admitting: Family

## 2019-11-03 DIAGNOSIS — Z1231 Encounter for screening mammogram for malignant neoplasm of breast: Secondary | ICD-10-CM | POA: Diagnosis not present

## 2019-11-07 ENCOUNTER — Other Ambulatory Visit: Payer: Self-pay | Admitting: Family

## 2019-11-07 DIAGNOSIS — R928 Other abnormal and inconclusive findings on diagnostic imaging of breast: Secondary | ICD-10-CM

## 2019-11-10 ENCOUNTER — Other Ambulatory Visit: Payer: Self-pay

## 2019-11-10 ENCOUNTER — Ambulatory Visit
Admission: RE | Admit: 2019-11-10 | Discharge: 2019-11-10 | Disposition: A | Discharge status: Home / Self Care | Payer: Medicare Other | Source: Ambulatory Visit | Attending: Family | Admitting: Family

## 2019-11-10 DIAGNOSIS — R921 Mammographic calcification found on diagnostic imaging of breast: Secondary | ICD-10-CM | POA: Diagnosis not present

## 2019-11-10 DIAGNOSIS — R928 Other abnormal and inconclusive findings on diagnostic imaging of breast: Secondary | ICD-10-CM

## 2019-11-29 ENCOUNTER — Telehealth: Payer: Self-pay | Admitting: Gastroenterology

## 2019-11-29 ENCOUNTER — Other Ambulatory Visit: Payer: Self-pay | Admitting: Internal Medicine

## 2019-11-29 ENCOUNTER — Telehealth: Payer: Self-pay

## 2019-11-29 MED ORDER — AMITRIPTYLINE HCL 50 MG PO TABS: 50.0000 mg | ORAL_TABLET | Freq: Every day | ORAL | 0 refills | Status: DC

## 2019-11-29 MED ORDER — FUROSEMIDE 40 MG PO TABS: 40.0000 mg | ORAL_TABLET | Freq: Every day | ORAL | 3 refills | Status: DC

## 2019-11-29 NOTE — Telephone Encounter (Signed)
Called patient with no answer and no voicemail to leave a message. 

## 2019-11-29 NOTE — Telephone Encounter (Signed)
Patient called requesting refills on Protonix and Pepcid also requesting for it to be for 90 days please advise

## 2019-11-29 NOTE — Telephone Encounter (Signed)
1.Medication Requested:amitriptyline (ELAVIL) 50 MG tablet  2. Pharmacy (Name, [place], City):[facility] - [place], [place] - [address]  3. On Med List: Yes   4. Last Visit with PCP: 1.15.2021  5. Next visit date with PCP: n/a   Agent: Please be advised that RX refills may take up to 3 business days. We ask that you follow-up with your pharmacy.

## 2019-11-29 NOTE — Telephone Encounter (Signed)
Reviewed chart pt is up-to-date sent refills to mail service.../lmb  

## 2019-11-29 NOTE — Telephone Encounter (Signed)
Rx(s) sent to pharmacy electronically.  

## 2019-11-29 NOTE — Telephone Encounter (Signed)
°*  STAT* If patient is at the pharmacy, call can be transferred to refill team.   1. Which medications need to be refilled? (please list name of each medication and dose if known) furosemide (LASIX) 40 MG tablet  2. Which pharmacy/location (including street and city if local pharmacy) is medication to be sent to?  [facility] - [place], [place] - [address]        3. Do they need a 30 day or 90 day supply? 90 day

## 2019-11-30 MED ORDER — PANTOPRAZOLE SODIUM 40 MG PO TBEC: DELAYED_RELEASE_TABLET | ORAL | 1 refills | Status: DC

## 2019-11-30 NOTE — Telephone Encounter (Signed)
Patient reports her reflux has been so much worse at night and needs a refill of her pantoprazole. Informed patient that it was recommended by our PA at her last appt to stop pantoprazole due to her magnesium levels. Patient states she has restarted her pantoprazole earlier this year because her reflux was so bad she could not sleep. Patient states she had her magnesium levels checked in March and were normal. Patient states she needs a refill sent to her [place]. Prescription sent to the pharmacy.

## 2019-12-05 ENCOUNTER — Encounter: Payer: Self-pay | Admitting: Family Medicine

## 2019-12-05 ENCOUNTER — Ambulatory Visit (INDEPENDENT_AMBULATORY_CARE_PROVIDER_SITE_OTHER): Payer: Medicare Other

## 2019-12-05 ENCOUNTER — Ambulatory Visit (INDEPENDENT_AMBULATORY_CARE_PROVIDER_SITE_OTHER): Payer: Medicare Other | Admitting: Family Medicine

## 2019-12-05 ENCOUNTER — Other Ambulatory Visit: Payer: Self-pay

## 2019-12-05 VITALS — BP 124/82 | HR 71 | Ht 67.0 in | Wt 218.8 lb

## 2019-12-05 DIAGNOSIS — M7062 Trochanteric bursitis, left hip: Secondary | ICD-10-CM

## 2019-12-05 DIAGNOSIS — G5702 Lesion of sciatic nerve, left lower limb: Secondary | ICD-10-CM

## 2019-12-05 DIAGNOSIS — Z96642 Presence of left artificial hip joint: Secondary | ICD-10-CM

## 2019-12-05 DIAGNOSIS — M5417 Radiculopathy, lumbosacral region: Secondary | ICD-10-CM

## 2019-12-05 MED ORDER — PREGABALIN 75 MG PO CAPS: 75.0000 mg | ORAL_CAPSULE | Freq: Two times a day (BID) | ORAL | 3 refills | Status: DC

## 2019-12-05 MED ORDER — HYDROCODONE-ACETAMINOPHEN 5-325 MG PO TABS: 1.0000 | ORAL_TABLET | Freq: Four times a day (QID) | ORAL | 0 refills | Status: DC | PRN

## 2019-12-05 NOTE — Progress Notes (Signed)
I, [name], LAT, ATC, am serving as scribe for Dr. [name].  [name] is a 74 y.o. female who presents to [place] at [facility] today for f/u of L hip and L leg pain.  She was last seen by Dr. Georgina Snell on 10/20/19 and had a L GT injection and was prescribed Lyrica and hydrocodone-acetaminophen.  Since her last visit, pt reports that the injection helped for a few weeks but notes that her L hip and L post leg pain is worse x approximately 3 weeks.  She reports some low back pain and notes that her L leg pain runs into her L calf.  She is out of both of her prescriptions currently (Lyrica and hydrocodone-acet).   Pertinent review of systems: No fevers or chills  Relevant historical information: Hypertension, short gut syndrome, diabetes.   Exam:  BP 124/82 (BP Location: Right Arm, Patient Position: Sitting, Cuff Size: Large)    Pulse 71    Ht 5\' 7"  (1.702 m)    Wt 218 lb 12.8 oz (99.2 kg)    SpO2 97%    BMI 34.27 kg/m  General: Well Developed, well nourished, and in no acute distress.   MSK: Left hip normal-appearing tender palpation greater trochanter decreased hip abduction strength. L-spine normal-appearing decreased motion.  Intact strength lower extremity except hip abduction noted above. Sensation is intact throughout.    Lab and Radiology Results  X-ray images L-spine and left hip obtained today personally and independently reviewed.  L-spine: Apparent fusion at L5-S1 with some neuroforaminal narrowing.  Anterolisthesis at L4-5 present.  No acute fractures.  Surgical staples present in the pelvis  Left hip: Multiple osteophytes at iliac crest and greater trochanter.  Multiple surgical does not in the pelvis.  No fractures.  Await formal radiology review    Assessment and Plan: 74 y.o. female with left lateral hip pain thought to be trochanteric bursitis failing 6 weeks of conservative management including injection and home exercise  program.  At this point she states that she is hurting too badly to participate in physical therapy.  Plan for MRI to clarify cause of pain and for potential surgical or injection planning.  Left lower leg pain.  Thought to be S1 radiculopathy versus piriformis syndrome possibly.  This is worsened recently.  This also is failing initial conservative management of about 6 weeks.  Plan for MRI for clarification of pain and for epidural steroid injection planning.  Recheck after MRI.   PDMP reviewed during this encounter. Orders Placed This Encounter  Procedures   DG Lumbar Spine 2-3 Views    Standing Status:   Future    Number of Occurrences:   1    Standing Expiration Date:   12/04/2020    Order Specific Question:   Reason for Exam (SYMPTOM  OR DIAGNOSIS REQUIRED)    Answer:   eval poss left S1 radic    Order Specific Question:   Preferred imaging location?    Answer:   [facility]    Order Specific Question:   Radiology Contrast Protocol - do NOT remove file path    Answer:   \[email or web address]   DG Hip Unilat W OR W/O Pelvis 2-3 Views Left    Standing Status:   Future    Number of Occurrences:   1    Standing Expiration Date:   12/04/2020    Order Specific Question:   Reason for Exam (SYMPTOM  OR DIAGNOSIS REQUIRED)  Answer:   eval lateral hip pain    Order Specific Question:   Preferred imaging location?    Answer:   [facility]    Order Specific Question:   Radiology Contrast Protocol - do NOT remove file path    Answer:   \[email or web address]   MR HIP LEFT WO CONTRAST    Standing Status:   Future    Standing Expiration Date:   12/04/2020    Order Specific Question:   What is the patient's sedation requirement?    Answer:   No Sedation    Order Specific Question:   Does the patient have a pacemaker or implanted devices?    Answer:   No    Order Specific Question:   Preferred imaging  location?    Answer:   [facility] (table limit-350lbs)    Order Specific Question:   Radiology Contrast Protocol - do NOT remove file path    Answer:   \[email or web address]   MR Lumbar Spine Wo Contrast    Standing Status:   Future    Standing Expiration Date:   12/04/2020    Order Specific Question:   What is the patient's sedation requirement?    Answer:   No Sedation    Order Specific Question:   Does the patient have a pacemaker or implanted devices?    Answer:   No    Order Specific Question:   Preferred imaging location?    Answer:   [facility] (table limit-350lbs)    Order Specific Question:   Radiology Contrast Protocol - do NOT remove file path    Answer:   \[email or web address]   Meds ordered this encounter  Medications   HYDROcodone-acetaminophen (NORCO/VICODIN) 5-325 MG tablet    Sig: Take 1 tablet by mouth every 6 (six) hours as needed.    Dispense:  15 tablet    Refill:  0   pregabalin (LYRICA) 75 MG capsule    Sig: Take 1 capsule (75 mg total) by mouth 2 (two) times daily. For nerve pain    Dispense:  60 capsule    Refill:  3     Discussed warning signs or symptoms. Please see discharge instructions. Patient expresses understanding.   The above documentation has been reviewed and is accurate and complete [name], M.D.

## 2019-12-05 NOTE — Patient Instructions (Signed)
Thank you for coming in today. Continue lyrica and occasional hydrocodone.  Plan for Xray today and MRI soon.  Let me know if you do not get called about scheduling the MRIs.  Follow up with me after MRI.   Come back or go to the emergency room if you notice new weakness new numbness problems walking or bowel or bladder problems.

## 2019-12-06 NOTE — Progress Notes (Signed)
Left hip shows some arthritis changes but no fractures.  MRI should be very helpful here.

## 2019-12-06 NOTE — Progress Notes (Signed)
X-ray lumbar spine does show some arthritis as well.  No fractures visible.  MRI will be helpful.

## 2019-12-09 ENCOUNTER — Ambulatory Visit (INDEPENDENT_AMBULATORY_CARE_PROVIDER_SITE_OTHER): Payer: Medicare Other

## 2019-12-09 ENCOUNTER — Other Ambulatory Visit: Payer: Self-pay

## 2019-12-09 DIAGNOSIS — G5702 Lesion of sciatic nerve, left lower limb: Secondary | ICD-10-CM | POA: Diagnosis not present

## 2019-12-09 DIAGNOSIS — Z96642 Presence of left artificial hip joint: Secondary | ICD-10-CM

## 2019-12-09 DIAGNOSIS — M7062 Trochanteric bursitis, left hip: Secondary | ICD-10-CM

## 2019-12-09 DIAGNOSIS — M5417 Radiculopathy, lumbosacral region: Secondary | ICD-10-CM | POA: Diagnosis not present

## 2019-12-11 ENCOUNTER — Telehealth: Payer: Self-pay | Admitting: Gastroenterology

## 2019-12-11 NOTE — Telephone Encounter (Signed)
Nexium 40 mg po bid, 1 year of refills

## 2019-12-11 NOTE — Telephone Encounter (Signed)
Patient reports that she had an "acid reflux attack" on Saturday evening.  She reports that she has been taking her pantoprazole BID for some time and uses carafate .  She would like to change PPIs.  She feesl the pantoprazole is not helping.

## 2019-12-11 NOTE — Telephone Encounter (Signed)
Pt is requesting a call back from a nurse. Pt states she is experiencing acid reflux that started this past weekend. Pt would like some advice on what she can do.

## 2019-12-12 MED ORDER — ESOMEPRAZOLE MAGNESIUM 40 MG PO CPDR
40.0000 mg | DELAYED_RELEASE_CAPSULE | Freq: Two times a day (BID) | ORAL | 3 refills | Status: DC
Start: 2019-12-12 — End: 2021-01-02

## 2019-12-12 NOTE — Telephone Encounter (Signed)
Patient notified She requested that the meds be sent to the [place].  Rx sent.  She will call back for additional questions or concerns.

## 2019-12-12 NOTE — Progress Notes (Signed)
MRI hip shows some mild arthritis but no stress fracture or avascular necrosis.  Mild tendinitis but no bursitis present.  Please schedule follow-up appointment with me in the near future.

## 2019-12-12 NOTE — Progress Notes (Signed)
MRI lumbar spine shows some bulging disks but no obvious pinched nerve.  This could cause back pain and pain into the buttocks but does not tell me exactly why you are having pain radiating down your leg.  Please schedule follow-up appointment with me in the near future.

## 2019-12-15 ENCOUNTER — Telehealth: Payer: Self-pay

## 2019-12-15 NOTE — Telephone Encounter (Signed)
New message    Checking on the status of fax 6.30.21 & 7.12.21   Will be re faxing over to [phone]

## 2019-12-18 ENCOUNTER — Encounter: Payer: Self-pay | Admitting: Family Medicine

## 2019-12-18 ENCOUNTER — Ambulatory Visit (INDEPENDENT_AMBULATORY_CARE_PROVIDER_SITE_OTHER): Payer: Medicare Other | Admitting: Family Medicine

## 2019-12-18 ENCOUNTER — Encounter: Payer: Self-pay | Admitting: Endocrinology

## 2019-12-18 ENCOUNTER — Other Ambulatory Visit: Payer: Self-pay

## 2019-12-18 ENCOUNTER — Ambulatory Visit (INDEPENDENT_AMBULATORY_CARE_PROVIDER_SITE_OTHER): Payer: Medicare Other | Admitting: Endocrinology

## 2019-12-18 VITALS — BP 130/98 | HR 84 | Ht 67.01 in | Wt 226.0 lb

## 2019-12-18 VITALS — BP 142/90 | HR 73 | Ht 67.0 in | Wt 226.0 lb

## 2019-12-18 DIAGNOSIS — G5702 Lesion of sciatic nerve, left lower limb: Secondary | ICD-10-CM | POA: Diagnosis not present

## 2019-12-18 DIAGNOSIS — E119 Type 2 diabetes mellitus without complications: Secondary | ICD-10-CM | POA: Diagnosis not present

## 2019-12-18 DIAGNOSIS — M5417 Radiculopathy, lumbosacral region: Secondary | ICD-10-CM | POA: Diagnosis not present

## 2019-12-18 DIAGNOSIS — E890 Postprocedural hypothyroidism: Secondary | ICD-10-CM | POA: Diagnosis not present

## 2019-12-18 LAB — POCT GLYCOSYLATED HEMOGLOBIN (HGB A1C): Hemoglobin A1C: 5.6 % (ref 4.0–5.6)

## 2019-12-18 LAB — TSH: TSH: 12.91 u[IU]/mL — ABNORMAL HIGH (ref 0.35–4.50)

## 2019-12-18 LAB — T4, FREE: Free T4: 0.83 ng/dL (ref 0.60–1.60)

## 2019-12-18 NOTE — Progress Notes (Signed)
Subjective:    Patient ID: [name], female    DOB: [birth year], 74 y.o.   MRN: [id number]  HPI Pt returns for f/u of diabetes mellitus: DM type: 2 Dx'ed: 2229 Complications: CRI Therapy: no medication recently GDM: never DKA: never Severe hypoglycemia: never Pancreatitis: never Pancreatic imaging: normal on 2020 CT.  SDOH: none Other: she has never been on insulin. Interval history: pt states she feels well in general. Pt also has MNG (dx'ed 2004: when she developed hyperthyroidism in 2010, she had RAI rx; she has been on synthroid since then; f/u US in 2021 was unchanged; in 2021, pt requested bx of the left lobe nodule--beth cat 1).  She takes 175 mcg/d Past Medical History:  Diagnosis Date  . Allergy   . ANEMIA, IRON DEFICIENCY 05/08/2009  . Angina   . ASYMPTOMATIC POSTMENOPAUSAL STATUS 10/11/2008  . Blood transfusion   . Blood transfusion without reported diagnosis   . Breast cancer ([place]) 09/29/11   invasive grade III ductal ca,assoc high grade dcis,ER/PR=neg  . C. difficile colitis   . Cataract   . Diverticulosis of colon (without mention of hemorrhage)   . Esophageal reflux 06/12/2008  . Gastroparesis   . GOITER, MULTINODULAR 04/02/2009  . Gout, unspecified   . H/O hiatal hernia   . History of lower GI bleeding   . History of radiation therapy 02/08/12-03/25/12   left breast,total 61gy  . Hypokalemia 05/11/2013  . Hypomagnesemia   . HYPOTHYROIDISM, POST-RADIATION 08/13/2009  . Internal hemorrhoids without mention of complication   . Kidney stones    "several"  . Leukopenia   . Migraines   . Obesity   . Osteoarthrosis, unspecified whether generalized or localized, unspecified site   . Other and unspecified hyperlipidemia   . Personal history of chemotherapy 2013  . Personal history of radiation therapy 2013   left  . PONV (postoperative nausea and vomiting)   . PUD (peptic ulcer disease)   . Short bowel syndrome   . Shortness of breath on exertion     "sometimes"  . Stricture and stenosis of esophagus   . Thyrotoxicosis without mention of goiter or other cause, without mention of thyrotoxic crisis or storm   . Type II or unspecified type diabetes mellitus without mention of complication, not stated as uncontrolled    no med in years diet controled  . Unspecified essential hypertension   . UTI (urinary tract infection)   . Varicose veins   . VITAMIN B12 DEFICIENCY 08/30/2009    Past Surgical History:  Procedure Laterality Date  . ABDOMINAL ADHESION SURGERY  1980's thru 1990's   "several"  . ABDOMINAL HYSTERECTOMY  1970's   with BSO  . BREAST BIOPSY Left 08/13/11   left breast lower inner quadrant  . BREAST BIOPSY Right 1985   Rt exc bx, benign  . BREAST LUMPECTOMY Left 08/2011  . BREAST LUMPECTOMY W/ NEEDLE LOCALIZATION  09/29/11   left  breast=lymph node,excision benign/ ER/PR=neg, her 2 Positive  . BUNIONECTOMY  1970's   bilateral  . CHOLECYSTECTOMY  1990's  . COLON SURGERY     "several surgeries for short bowel syndrome"  . COLONOSCOPY  2012   multiple   . DILATION AND CURETTAGE OF UTERUS    . ESOPHAGOGASTRODUODENOSCOPY  2011   multiple   . EXCISIONAL HEMORRHOIDECTOMY  11/10/2016  . EYE SURGERY     "long time ago"  . FLEXIBLE SIGMOIDOSCOPY  2011   multiple   . KIDNEY STONE SURGERY  1990's   "  tried to go up & get it but pushed it further up"  . LITHOTRIPSY     "4 or 5 times"  . MASTECTOMY W/ NODES PARTIAL  09/29/11   left  . PORT-A-CATH REMOVAL Right 12/19/2013   Procedure: MINOR REMOVAL PORT-A-CATH;  Surgeon: Adin Hector, MD;  Location: [facility];  Service: General;  Laterality: Right;  . PORTACATH PLACEMENT  09/29/2011   Procedure: INSERTION PORT-A-CATH;  Surgeon: Adin Hector, MD;  Location: [facility];  Service: General;  Laterality: N/A;  . SMALL INTESTINE SURGERY    . Thyroid Ultrasound  12/1994 and 12/1995  . TOTAL KNEE ARTHROPLASTY Right 06/05/2015   Procedure: RIGHT TOTAL KNEE ARTHROPLASTY;   Surgeon: Ninetta Lights, MD;  Location: [facility];  Service: Orthopedics;  Laterality: Right;  . UPPER GASTROINTESTINAL ENDOSCOPY    . VEIN LIGATION AND STRIPPING  1980's   Right leg    Social History   Socioeconomic History  . Marital status: Widowed    Spouse name: Not on file  . Number of children: 2  . Years of education: Not on file  . Highest education level: Not on file  Occupational History  . Occupation: RETIRED  Tobacco Use  . Smoking status: Former Smoker    Packs/day: 1.00    Years: 10.00    Pack years: 10.00    Types: Cigarettes    Quit date: 09/22/1985    Years since quitting: 34.2  . Smokeless tobacco: Never Used  Vaping Use  . Vaping Use: Never used  Substance and Sexual Activity  . Alcohol use: No    Comment: 09/29/11 "used to drink socially years ago"  . Drug use: No  . Sexual activity: Yes    Comment: 1st intercourse- 15, partners- 41, boyfriend- 1.8 yrs  Other Topics Concern  . Not on file  Social History Narrative   Pt gets regular exercise   Social Determinants of Health   Financial Resource Strain:   . Difficulty of Paying Living Expenses:   Food Insecurity:   . Worried About Charity fundraiser in the Last Year:   . Arboriculturist in the Last Year:   Transportation Needs:   . Film/video editor (Medical):   Marland Kitchen Lack of Transportation (Non-Medical):   Physical Activity:   . Days of Exercise per Week:   . Minutes of Exercise per Session:   Stress:   . Feeling of Stress :   Social Connections:   . Frequency of Communication with Friends and Family:   . Frequency of Social Gatherings with Friends and Family:   . Attends Religious Services:   . Active Member of Clubs or Organizations:   . Attends Archivist Meetings:   Marland Kitchen Marital Status:   Intimate Partner Violence:   . Fear of Current or Ex-Partner:   . Emotionally Abused:   Marland Kitchen Physically Abused:   . Sexually Abused:     Current Outpatient Medications on File Prior to Visit   Medication Sig Dispense Refill  . levothyroxine (SYNTHROID) 175 MCG tablet Take 1 tablet (175 mcg total) by mouth daily before breakfast. 90 tablet 3  . allopurinol (ZYLOPRIM) 100 MG tablet Take 1 tablet (100 mg total) by mouth daily. 90 tablet 3  . amitriptyline (ELAVIL) 50 MG tablet Take 1 tablet (50 mg total) by mouth at bedtime. Annual appt IS due  must see provider for future refills 90 tablet 0  . calcium carbonate (TUMS - DOSED IN  MG ELEMENTAL CALCIUM) 500 MG chewable tablet Chew 1 tablet by mouth as needed for indigestion or heartburn.    . Calcium Carbonate-Vitamin D (CALCIUM-VITAMIN D) 600-200 MG-UNIT CAPS Take 1 capsule by mouth daily.      . Cyanocobalamin (VITAMIN B 12 PO) Take 1 tablet by mouth 2 (two) times daily.     . cyclobenzaprine (FLEXERIL) 5 MG tablet Take 1-2 tablets (5-10 mg total) by mouth 3 (three) times daily as needed for muscle spasms. Take mostly at bedtime. 30 tablet 1  . dicyclomine (BENTYL) 20 MG tablet Take 1 tablet (20 mg total) by mouth 3 (three) times daily as needed for spasms. 360 tablet 3  . diphenoxylate-atropine (LOMOTIL) 2.5-0.025 MG tablet Take 1 tablet by mouth 3 times daily as needed for diarrhea for >3-4 stool /day. 270 tablet 0  . esomeprazole (NEXIUM) 40 MG capsule Take 1 capsule (40 mg total) by mouth 2 (two) times daily before a meal. 180 capsule 3  . famotidine (PEPCID) 40 MG tablet Take 1 tablet (40 mg total) by mouth at bedtime. 90 tablet 3  . ferrous sulfate 325 (65 FE) MG tablet Take 325 mg by mouth daily with breakfast.    . fluticasone (FLONASE) 50 MCG/ACT nasal spray Place 2 sprays into both nostrils daily. (Patient taking differently: Place 2 sprays into both nostrils daily as needed. ) 16 g 6  . Fluticasone-Umeclidin-Vilant (TRELEGY ELLIPTA) 100-62.5-25 MCG/INH AEPB Inhale 1 puff into the lungs daily. 28 each 0  . furosemide (LASIX) 40 MG tablet Take 1 tablet (40 mg total) by mouth daily. 90 tablet 3  . gentamicin cream (GARAMYCIN) 0.1 %  Apply 1 application topically 2 (two) times daily. 15 g 1  . HYDROcodone-acetaminophen (NORCO/VICODIN) 5-325 MG tablet Take 1 tablet by mouth every 6 (six) hours as needed. 15 tablet 0  . loratadine (CLARITIN) 10 MG tablet Take 1 tablet (10 mg total) by mouth daily. 30 tablet 11  . lovastatin (MEVACOR) 20 MG tablet TAKE 1 TABLET BY MOUTH  DAILY AT 6 PM. 90 tablet 2  . Magnesium 500 MG CAPS Take 1 mg by mouth 2 (two) times daily.     . nitroGLYCERIN (NITROSTAT) 0.4 MG SL tablet Place 1 tablet (0.4 mg total) under the tongue every 5 (five) minutes as needed. 25 tablet 6  . pantoprazole (PROTONIX) 40 MG tablet TAKE 1 TABLET BY MOUTH 30 MINUTES PRIOR TO BREAKFAST AND SUPPER 180 tablet 1  . pregabalin (LYRICA) 75 MG capsule Take 1 capsule (75 mg total) by mouth 2 (two) times daily. For nerve pain 60 capsule 3  . sucralfate (CARAFATE) 1 g tablet Please crush 1 gram tablet and mix with water to make it into a slurry. Use 2 times daily for 2 weeks. 28 tablet 0  . Thiamine HCl (THIAMINE PO) Take 1 tablet by mouth daily.      Current Facility-Administered Medications on File Prior to Visit  Medication Dose Route Frequency Provider Last Rate Last Admin  . acetaminophen (TYLENOL) tablet 1,000 mg  1,000 mg Oral Once Amada Kingfisher, MD        Allergies  Allergen Reactions  . Aspirin Other (See Comments)    REACTION: Gi Intolerance/ Burning in stomach  . Trazodone And Nefazodone Nausea And Vomiting    "sick"  . Flagyl [Metronidazole] Rash  . Morphine And Related Itching    Family History  Problem Relation Age of Onset  . Esophageal cancer Son        deceased  .  Breast cancer Son        esophageal  . Diabetes Mother   . Heart disease Mother   . Kidney disease Sister   . Diabetes Father   . Hypertension Father   . Kidney disease Brother        x 3  . Colon cancer Paternal Uncle   . Breast cancer Maternal Aunt   . Breast cancer Paternal Aunt   . Breast cancer Cousin        Pt states she has  15+ cousins w/ Breast CA  . Rectal cancer Neg Hx   . Stomach cancer Neg Hx     BP (!) 130/98 (BP Location: Left Arm, Patient Position: Sitting)   Pulse 84   Ht 5' 7.01" (1.702 m)   Wt 226 lb (102.5 kg)   SpO2 95%   BMI 35.39 kg/m   Review of Systems she denies palpitations and tremor    Objective:   Physical Exam VITAL SIGNS:  See vs page GENERAL: no distress Pulses: dorsalis pedis intact bilat.   MSK: no deformity of the feet CV: 2+ bilat leg edema Skin:  no ulcer on the feet.  normal color and temp on the feet. Neuro: sensation is intact to touch on the feet.    Lab Results  Component Value Date   HGBA1C 5.6 12/18/2019     Lab Results  Component Value Date   CREATININE 1.26 (H) 06/29/2019   BUN 21 06/29/2019   NA 138 06/29/2019   K 3.9 06/29/2019   CL 98 06/29/2019   CO2 24 06/29/2019   Lab Results  Component Value Date   TSH 12.91 (H) 12/18/2019   T4TOTAL 8.1 07/29/2007       Assessment & Plan:  Hypothyroidism: worse. I told pt that she should continue the same medication, as last TSH was low Type 2 DM: stable off rx Please come back for a follow-up appointment in 3 months

## 2019-12-18 NOTE — Patient Instructions (Addendum)
Your blood pressure is high today.  Please see your primary care provider soon, to have it rechecked No medication is needed for the diabetes now Thyroid blood tests are requested for you today.  We'll let you know about the results.  Please come back for a follow-up appointment in 6 months.

## 2019-12-18 NOTE — Patient Instructions (Addendum)
Thank you for coming in today. Call [facility] at [phone] to schedule the back injection Recheck with me about 2 weeks after the injection (should be about 4 weeks from now).  You will need someone to drive you to the injection and back.   Let the radiology people know about your history of allergy to contrast and that you did ok since.   Epidural Steroid Injection Patient Information  Description: The epidural space surrounds the nerves as they exit the spinal cord.  In some patients, the nerves can be compressed and inflamed by a bulging disc or a tight spinal canal (spinal stenosis).  By injecting steroids into the epidural space, we can bring irritated nerves into direct contact with a potentially helpful medication.  These steroids act directly on the irritated nerves and can reduce swelling and inflammation which often leads to decreased pain.  Epidural steroids may be injected anywhere along the spine and from the neck to the low back depending upon the location of your pain.   After numbing the skin with local anesthetic (like Novocaine), a small needle is passed into the epidural space slowly.  You may experience a sensation of pressure while this is being done.  The entire block usually last less than 10 minutes.  Conditions which may be treated by epidural steroids:   Low back and leg pain  Neck and arm pain  Spinal stenosis  Post-laminectomy syndrome  Herpes zoster (shingles) pain  Pain from compression fractures  Preparation for the injection:  1. Do not eat any solid food or dairy products within 8 hours of your appointment.  2. You may drink clear liquids up to 3 hours before appointment.  Clear liquids include water, black coffee, juice or soda.  No milk or cream please. 3. You may take your regular medication, including pain medications, with a sip of water before your appointment  Diabetics should hold regular insulin (if taken separately) and take 1/2  normal NPH dos the morning of the procedure.  Carry some sugar containing items with you to your appointment. 4. A driver must accompany you and be prepared to drive you home after your procedure.  5. Bring all your current medications with your. 6. An IV may be inserted and sedation may be given at the discretion of the physician.   7. A blood pressure cuff, EKG and other monitors will often be applied during the procedure.  Some patients may need to have extra oxygen administered for a short period. 8. You will be asked to provide medical information, including your allergies, prior to the procedure.  We must know immediately if you are taking blood thinners (like Coumadin/Warfarin)  Or if you are allergic to IV iodine contrast (dye). We must know if you could possible be pregnant.  Possible side-effects:  Bleeding from needle site  Infection (rare, may require surgery)  Nerve injury (rare)  Numbness & tingling (temporary)  Difficulty urinating (rare, temporary)  Spinal headache ( a headache worse with upright posture)  Light -headedness (temporary)  Pain at injection site (several days)  Decreased blood pressure (temporary)  Weakness in arm/leg (temporary)  Pressure sensation in back/neck (temporary)  Call if you experience:  Fever/chills associated with headache or increased back/neck pain.  Headache worsened by an upright position.  New onset weakness or numbness of an extremity below the injection site  Hives or difficulty breathing (go to the emergency room)  Inflammation or drainage at the infection site  Severe back/neck  pain  Any new symptoms which are concerning to you  Please note:  Although the local anesthetic injected can often make your back or neck feel good for several hours after the injection, the pain will likely return.  It takes 3-7 days for steroids to work in the epidural space.  You may not notice any pain relief for at least that one  week.  If effective, we will often do a series of three injections spaced 3-6 weeks apart to maximally decrease your pain.  After the initial series, we generally will wait several months before considering a repeat injection of the same type.

## 2019-12-18 NOTE — Progress Notes (Signed)
I, [name], LAT, ATC, am serving as scribe for Dr. [name].  [name] is a 74 y.o. female who presents to [place] at [facility] today for f/u of L hip and leg pain and L-spine and L hip MRI review.  She was last seen by Dr. Georgina Snell 12/05/19 w/ c/o worsening L leg pain running from her L hip to her L calf and was referred for an L-spine and L hip MRI.  She was prescribed Lyrica and hydrocodone-acetaminophen.  She had a prior L GT injection on 10/20/19 that only provided limited relief.  Since her last visit, pt reports she is doing ok. States she feels about the same with minimal improvement.   Diagnostic testing: L-spine and L hip MRI- 12/09/19; L hip and L-spine XR- 12/05/19   Pertinent review of systems: No fevers or chills  Relevant historical information: History allergy to IV contrast.  She gets itchy.  She has had contrast since and did well.   Exam:  BP (!) 142/90 (BP Location: Left Arm, Patient Position: Sitting)   Pulse 73   Ht 5\' 7"  (1.702 m)   Wt 226 lb (102.5 kg)   SpO2 95%   BMI 35.40 kg/m  General: Well Developed, well nourished, and in no acute distress.   MSK: L-spine nontender decreased lumbar motion. Left hip mildly tender with decreased range of motion. Positive left-sided slump test. Lower extremity strength is equal bilaterally but diffusely weak 4/5.  No specific isolated weakness either lower extremity.    Lab and Radiology Results  EXAM: MR OF THE LEFT HIP WITHOUT CONTRAST  TECHNIQUE: Multiplanar, multisequence MR imaging was performed. No intravenous contrast was administered.  COMPARISON:  None.  FINDINGS: Both hips are normally located. Mild degenerative changes bilaterally but no stress fracture or AVN. No hip joint effusion or periarticular fluid collections to suggest a paralabral cyst.  The pubic symphysis and SI joints are intact. No pelvic fractures or bone lesions.  The surrounding hip and  pelvic musculature are unremarkable. No muscle tear, myositis or mass. Mild bilateral peritendinosis but no findings for trochanteric bursitis.  No significant intrapelvic abnormalities are identified.  IMPRESSION: 1. Mild bilateral hip joint degenerative changes but no stress fracture or AVN. 2. Mild bilateral peritendinosis but no findings for trochanteric bursitis. 3. Intact bony pelvis. 4. No significant intrapelvic abnormalities.   Electronically Signed   By: Marijo Sanes M.D.   On: 12/11/2019 08:22 EXAM: MRI LUMBAR SPINE WITHOUT CONTRAST  TECHNIQUE: Multiplanar, multisequence MR imaging of the lumbar spine was performed. No intravenous contrast was administered.  COMPARISON:  Radiography 12/05/2019  FINDINGS: Segmentation:  5 lumbar type vertebral bodies.  Alignment:  Normal  Vertebrae:  Normal  Conus medullaris and cauda equina: Conus extends to the L1 level. Conus and cauda equina appear normal.  Paraspinal and other soft tissues: Normal  Disc levels:  No abnormality at L1-2 or above.  L2-3: Bulging of the disc more prominent towards the left. Facet degeneration and hypertrophy. Mild narrowing of the left lateral recess but no gross neural compression. The facet arthritis could be symptomatic.  L3-4: Bulging of the disc. Bilateral facet and ligamentous hypertrophy. Mild stenosis of both lateral recesses without definite neural compression. The facet arthritis could be symptomatic.  L4-5: Bulging of the disc. Facet and ligamentous hypertrophy. Mild stenosis of both lateral recesses but without definite neural compression. The facet arthritis could be symptomatic.  L5-S1: No disc bulge or herniation. Bilateral facet osteoarthritis  without encroachment upon the neural spaces. The arthritis could be symptomatic.  IMPRESSION: Disc bulges from L2-3 through L4-5. Facet and ligamentous hypertrophy at those levels. Narrowing of the left  lateral recess at L2-3, both lateral recesses at L3-4 and both lateral recesses at L4-5, but without visible neural compression. The facet arthritis at those levels as well as at L5-S1 could be a cause of back pain or referred facet syndrome.   Electronically Signed   By: Nelson Chimes M.D.   On: 12/10/2019 15:50 I, [name], personally (independently) visualized and performed the interpretation of the images attached in this note.   Assessment and Plan: 74 y.o. female with left buttocks pain radiating down left leg.  Etiology is somewhat unclear.  Patient is already failed typical conservative management with physical therapy and greater trochanter injection.  Based on MRI and her physical exam likely lumbar radiculopathy.  L4 level is the best fit with her pain although she does have some pain to the anterior calf and some pain to the posterior calf which fits her MRI less well.  Some of her pain could still be due to piriformis syndrome however this is less likely given relatively normal hip MRI.  Plan for trial of epidural steroid injection.  She does have a listed allergy to iodine IV contrast but has had subsequent IV contrast studies and did well with no issues.  She may need premedication will confer with radiology if needed.  Recheck a few weeks after epidural steroid injection.   \ Orders Placed This Encounter  Procedures  . DG INJECT DIAG/THERA/INC NEEDLE/CATH/PLC EPI/LUMB/SAC W/IMG    Standing Status:   Future    Standing Expiration Date:   12/17/2020    Order Specific Question:   Reason for Exam (SYMPTOM  OR DIAGNOSIS REQUIRED)    Answer:   left L4-L5. Technique per radiology    Order Specific Question:   Preferred Imaging Location?    Answer:   [facility]    Order Specific Question:   Radiology Contrast Protocol - do NOT remove file path    Answer:   [email or web address]   No orders of the defined types were placed in this  encounter.    Discussed warning signs or symptoms. Please see discharge instructions. Patient expresses understanding.   The above documentation has been reviewed and is accurate and complete [name], M.D.

## 2019-12-22 NOTE — Telephone Encounter (Signed)
F/u    Checking on the status of fax that was sent on  6.30.21 & 7.12.21

## 2019-12-25 NOTE — Telephone Encounter (Signed)
Spoke with patient to let her know Mickel Baas does not sign paperwork for any genetic testing groups or outside testing.  Patient states she doesn't know who the company was and she did not request any testing.

## 2019-12-26 ENCOUNTER — Ambulatory Visit
Admission: RE | Admit: 2019-12-26 | Discharge: 2019-12-26 | Disposition: A | Discharge status: Home / Self Care | Payer: Medicare Other | Source: Ambulatory Visit | Attending: Family Medicine | Admitting: Family Medicine

## 2019-12-26 ENCOUNTER — Other Ambulatory Visit: Payer: Self-pay

## 2019-12-26 DIAGNOSIS — M5417 Radiculopathy, lumbosacral region: Secondary | ICD-10-CM

## 2019-12-26 DIAGNOSIS — M545 Low back pain: Secondary | ICD-10-CM | POA: Diagnosis not present

## 2019-12-26 MED ORDER — METHYLPREDNISOLONE ACETATE 40 MG/ML INJ SUSP (RADIOLOG
120.0000 mg | Freq: Once | INTRAMUSCULAR | Status: AC
  Administered 2019-12-26: 120 mg via EPIDURAL

## 2019-12-26 MED ORDER — IOPAMIDOL (ISOVUE-M 200) INJECTION 41%
1.0000 mL | Freq: Once | INTRAMUSCULAR | Status: AC
  Administered 2019-12-26: 1 mL via EPIDURAL

## 2019-12-26 NOTE — Discharge Instructions (Signed)

## 2019-12-29 ENCOUNTER — Telehealth: Payer: Self-pay | Admitting: Family

## 2019-12-29 NOTE — Telephone Encounter (Signed)
Have you seen the gene swab form?  If so, would/did you sign it.   This is typically not covered by insurance. I can inform patient of same.

## 2019-12-29 NOTE — Telephone Encounter (Signed)
Form for the patient to sign and date Cardiac gene detection  At home swab  [employer] [phone]  Form originally sent on 7.12.21

## 2019-12-29 NOTE — Telephone Encounter (Signed)
I am pretty sure we told her we weren't ordering this for her. I don't typically order these type of tests.

## 2020-01-04 NOTE — Telephone Encounter (Signed)
Left a message for [name] informing that the form for the "gene swab" will not be signed and the request to sign is denied.

## 2020-01-10 ENCOUNTER — Ambulatory Visit: Payer: Medicare Other | Admitting: Endocrinology

## 2020-02-08 ENCOUNTER — Telehealth: Payer: Self-pay | Admitting: Family

## 2020-02-08 NOTE — Telephone Encounter (Signed)
    1.Medication Requested:lovastatin (MEVACOR) 20 MG tablet  2. Pharmacy (Name, [facility], [place]): [facility]  3. On Med List: yes  4. Last Visit with PCP: 06/16/19  5. Next visit date with PCP: 02/12/20   Agent: Please be advised that RX refills may take up to 3 business days. We ask that you follow-up with your pharmacy.

## 2020-02-12 ENCOUNTER — Other Ambulatory Visit: Payer: Self-pay

## 2020-02-12 ENCOUNTER — Ambulatory Visit (INDEPENDENT_AMBULATORY_CARE_PROVIDER_SITE_OTHER): Payer: Medicare Other | Admitting: Family

## 2020-02-12 ENCOUNTER — Ambulatory Visit (INDEPENDENT_AMBULATORY_CARE_PROVIDER_SITE_OTHER): Payer: Medicare Other

## 2020-02-12 VITALS — BP 120/65 | HR 64 | Temp 97.9°F | Ht 67.0 in | Wt 222.6 lb

## 2020-02-12 DIAGNOSIS — R600 Localized edema: Secondary | ICD-10-CM

## 2020-02-12 DIAGNOSIS — R062 Wheezing: Secondary | ICD-10-CM

## 2020-02-12 DIAGNOSIS — Z23 Encounter for immunization: Secondary | ICD-10-CM | POA: Diagnosis not present

## 2020-02-12 DIAGNOSIS — R350 Frequency of micturition: Secondary | ICD-10-CM

## 2020-02-12 DIAGNOSIS — L750 Bromhidrosis: Secondary | ICD-10-CM | POA: Diagnosis not present

## 2020-02-12 MED ORDER — FERROUS SULFATE 325 (65 FE) MG PO TABS: 325.0000 mg | ORAL_TABLET | Freq: Every day | ORAL | Status: DC

## 2020-02-12 MED ORDER — LORATADINE 10 MG PO TABS: 10.0000 mg | ORAL_TABLET | Freq: Every day | ORAL | 3 refills | Status: DC

## 2020-02-12 MED ORDER — FLUTICASONE PROPIONATE 50 MCG/ACT NA SUSP: 2.0000 | Freq: Every day | NASAL | 6 refills | Status: DC

## 2020-02-12 MED ORDER — TRELEGY ELLIPTA 200-62.5-25 MCG/INH IN AEPB: INHALATION_SPRAY | RESPIRATORY_TRACT | 1 refills | Status: DC

## 2020-02-12 NOTE — Progress Notes (Signed)
[name] is a 74 y.o. female with the following history as recorded in Epic[employer]:  Patient Active Problem List   Diagnosis Date Noted  . 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 Dyspnea 02/16/2018  . 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 UTI (urinary tract infection) 11/27/2014  . UTI (lower urinary tract infection) 11/27/2014  . Acute cystitis with hematuria 11/13/2014  . Hemorrhoids, external 11/04/2014  . Wellness examination 10/03/2014  . Pain in joint, shoulder region 09/24/2014  . Cramp in limb 06/26/2014  . Rectal bleeding 05/02/2014  . External hemorrhoids 05/02/2014  . Hemorrhoids without complication 12/45/8099  . Pain in joint, lower leg 02/01/2014  . Vitamin D deficiency 01/16/2014  . Gastroparesis 09/14/2013  . Other dysphagia 09/04/2013  . Nausea alone 09/04/2013  . Iron deficiency anemia 08/28/2013  . Hypomagnesemia 05/11/2013  . Hypokalemia 05/11/2013  . Generalized weakness 05/11/2013  . Dehydration 11/25/2011  . Fatigue 11/16/2011  . Breast cancer ([place]) 09/29/2011  . Family history of breast cancer 08/19/2011  . Primary cancer of lower-inner quadrant of left female breast ([place]) 08/17/2011  . Neck pain 07/29/2011  . Routine general medical examination at a health care facility 06/28/2011  . Edema 11/26/2010  . CLOSTRIDIUM DIFFICILE COLITIS 02/07/2010  . Blind loop syndrome 10/01/2009  . Abdominal pain 10/01/2009  . Vitamin B 12 deficiency 08/30/2009  . 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 Rash and nonspecific skin eruption 08/13/2009  . GOITER, MULTINODULAR 04/02/2009  . CONTACT DERMATITIS&OTHER ECZEMA DUE UNSPEC CAUSE 02/20/2009  . URINARY CALCULUS 10/11/2008  . Asymptomatic menopausal state 10/11/2008  . BACK PAIN, CHRONIC 07/03/2008  . Esophageal reflux 06/12/2008  . Diarrhea 06/12/2008  . ABDOMINAL PAIN-RUQ 05/17/2008  . Diabetes ([place]) 12/10/2006  . Dyslipidemia 12/10/2006  . Gout 12/10/2006  .  HYPERTENSION 12/10/2006  . DIVERTICULOSIS, COLON 12/10/2006  . OSTEOARTHRITIS 12/10/2006  . ESOPHAGEAL STRICTURE 08/23/2002  . HIATAL HERNIA 08/23/2002  . INTERNAL HEMORRHOIDS 02/02/2001    Current Outpatient Medications  Medication Sig Dispense Refill  . allopurinol (ZYLOPRIM) 100 MG tablet Take 1 tablet (100 mg total) by mouth daily. 90 tablet 3  . amitriptyline (ELAVIL) 50 MG tablet Take 1 tablet (50 mg total) by mouth at bedtime. Annual appt IS due  must see provider for future refills 90 tablet 0  . calcium carbonate (TUMS - DOSED IN MG ELEMENTAL CALCIUM) 500 MG chewable tablet Chew 1 tablet by mouth as needed for indigestion or heartburn.    . Calcium Carbonate-Vitamin D (CALCIUM-VITAMIN D) 600-200 MG-UNIT CAPS Take 1 capsule by mouth daily.      . Cyanocobalamin (VITAMIN B 12 PO) Take 1 tablet by mouth 2 (two) times daily.     . cyclobenzaprine (FLEXERIL) 5 MG tablet Take 1-2 tablets (5-10 mg total) by mouth 3 (three) times daily as needed for muscle spasms. Take mostly at bedtime. 30 tablet 1  . dicyclomine (BENTYL) 20 MG tablet Take 1 tablet (20 mg total) by mouth 3 (three) times daily as needed for spasms. 360 tablet 3  . diphenoxylate-atropine (LOMOTIL) 2.5-0.025 MG tablet Take 1 tablet by mouth 3 times daily as needed for diarrhea for >3-4 stool /day. 270 tablet 0  . esomeprazole (NEXIUM) 40 MG capsule Take 1 capsule (40 mg total) by mouth 2 (two) times daily before a meal. 180 capsule 3  . famotidine (PEPCID) 40 MG tablet Take 1 tablet (40 mg total) by mouth at bedtime. 90 tablet 3  . Fluticasone-Umeclidin-Vilant (TRELEGY ELLIPTA) 100-62.5-25 MCG/INH AEPB  Inhale 1 puff into the lungs daily. 28 each 0  . furosemide (LASIX) 40 MG tablet Take 1 tablet (40 mg total) by mouth daily. 90 tablet 3  . HYDROcodone-acetaminophen (NORCO/VICODIN) 5-325 MG tablet Take 1 tablet by mouth every 6 (six) hours as needed. 15 tablet 0  . levothyroxine (SYNTHROID) 175 MCG tablet Take 1 tablet (175 mcg  total) by mouth daily before breakfast. 90 tablet 3  . loratadine (CLARITIN) 10 MG tablet Take 1 tablet (10 mg total) by mouth daily. 90 tablet 3  . lovastatin (MEVACOR) 20 MG tablet TAKE 1 TABLET BY MOUTH  DAILY AT 6 PM. 90 tablet 2  . Magnesium 500 MG CAPS Take 1 mg by mouth 2 (two) times daily.     . nitroGLYCERIN (NITROSTAT) 0.4 MG SL tablet Place 1 tablet (0.4 mg total) under the tongue every 5 (five) minutes as needed. 25 tablet 6  . pantoprazole (PROTONIX) 40 MG tablet TAKE 1 TABLET BY MOUTH 30 MINUTES PRIOR TO BREAKFAST AND SUPPER 180 tablet 1  . pregabalin (LYRICA) 75 MG capsule Take 1 capsule (75 mg total) by mouth 2 (two) times daily. For nerve pain 60 capsule 3  . Thiamine HCl (THIAMINE PO) Take 1 tablet by mouth daily.     . ferrous sulfate 325 (65 FE) MG tablet Take 1 tablet (325 mg total) by mouth daily with breakfast.    . fluticasone (FLONASE) 50 MCG/ACT nasal spray Place 2 sprays into both nostrils daily. 16 g 6  . Fluticasone-Umeclidin-Vilant (TRELEGY ELLIPTA) 200-62.5-25 MCG/INH AEPB Inhale daily as directed 3 each 1  . gentamicin cream (GARAMYCIN) 0.1 % Apply 1 application topically 2 (two) times daily. (Patient not taking: Reported on 02/12/2020) 15 g 1  . sucralfate (CARAFATE) 1 g tablet Please crush 1 gram tablet and mix with water to make it into a slurry. Use 2 times daily for 2 weeks. (Patient not taking: Reported on 02/12/2020) 28 tablet 0   No current facility-administered medications for this visit.   Facility-Administered Medications Ordered in Other Visits  Medication Dose Route Frequency Provider Last Rate Last Admin  . acetaminophen (TYLENOL) tablet 1,000 mg  1,000 mg Oral Once Amada Kingfisher, MD        Allergies: Aspirin, Trazodone and nefazodone, Flagyl [metronidazole], and Morphine and related  Past Medical History:  Diagnosis Date  . Allergy   . ANEMIA, IRON DEFICIENCY 05/08/2009  . Angina   . ASYMPTOMATIC POSTMENOPAUSAL STATUS 10/11/2008  . Blood  transfusion   . Blood transfusion without reported diagnosis   . Breast cancer ([place]) 09/29/11   invasive grade III ductal ca,assoc high grade dcis,ER/PR=neg  . C. difficile colitis   . Cataract   . Diverticulosis of colon (without mention of hemorrhage)   . Esophageal reflux 06/12/2008  . Gastroparesis   . GOITER, MULTINODULAR 04/02/2009  . Gout, unspecified   . H/O hiatal hernia   . History of lower GI bleeding   . History of radiation therapy 02/08/12-03/25/12   left breast,total 61gy  . Hypokalemia 05/11/2013  . Hypomagnesemia   . HYPOTHYROIDISM, POST-RADIATION 08/13/2009  . Internal hemorrhoids without mention of complication   . Kidney stones    "several"  . Leukopenia   . Migraines   . Obesity   . Osteoarthrosis, unspecified whether generalized or localized, unspecified site   . Other and unspecified hyperlipidemia   . Personal history of chemotherapy 2013  . Personal history of radiation therapy 2013   left  . PONV (postoperative nausea and  vomiting)   . PUD (peptic ulcer disease)   . Short bowel syndrome   . Shortness of breath on exertion    "sometimes"  . Stricture and stenosis of esophagus   . Thyrotoxicosis without mention of goiter or other cause, without mention of thyrotoxic crisis or storm   . Type II or unspecified type diabetes mellitus without mention of complication, not stated as uncontrolled    no med in years diet controled  . Unspecified essential hypertension   . UTI (urinary tract infection)   . Varicose veins   . VITAMIN B12 DEFICIENCY 08/30/2009    Past Surgical History:  Procedure Laterality Date  . ABDOMINAL ADHESION SURGERY  1980's thru 1990's   "several"  . ABDOMINAL HYSTERECTOMY  1970's   with BSO  . BREAST BIOPSY Left 08/13/11   left breast lower inner quadrant  . BREAST BIOPSY Right 1985   Rt exc bx, benign  . BREAST LUMPECTOMY Left 08/2011  . BREAST LUMPECTOMY W/ NEEDLE LOCALIZATION  09/29/11   left  breast=lymph node,excision benign/  ER/PR=neg, her 2 Positive  . BUNIONECTOMY  1970's   bilateral  . CHOLECYSTECTOMY  1990's  . COLON SURGERY     "several surgeries for short bowel syndrome"  . COLONOSCOPY  2012   multiple   . DILATION AND CURETTAGE OF UTERUS    . ESOPHAGOGASTRODUODENOSCOPY  2011   multiple   . EXCISIONAL HEMORRHOIDECTOMY  11/10/2016  . EYE SURGERY     "long time ago"  . FLEXIBLE SIGMOIDOSCOPY  2011   multiple   . KIDNEY STONE SURGERY  1990's   "tried to go up & get it but pushed it further up"  . LITHOTRIPSY     "4 or 5 times"  . MASTECTOMY W/ NODES PARTIAL  09/29/11   left  . PORT-A-CATH REMOVAL Right 12/19/2013   Procedure: MINOR REMOVAL PORT-A-CATH;  Surgeon: Adin Hector, MD;  Location: [facility];  Service: General;  Laterality: Right;  . PORTACATH PLACEMENT  09/29/2011   Procedure: INSERTION PORT-A-CATH;  Surgeon: Adin Hector, MD;  Location: [facility];  Service: General;  Laterality: N/A;  . SMALL INTESTINE SURGERY    . Thyroid Ultrasound  12/1994 and 12/1995  . TOTAL KNEE ARTHROPLASTY Right 06/05/2015   Procedure: RIGHT TOTAL KNEE ARTHROPLASTY;  Surgeon: Ninetta Lights, MD;  Location: [facility];  Service: Orthopedics;  Laterality: Right;  . UPPER GASTROINTESTINAL ENDOSCOPY    . VEIN LIGATION AND STRIPPING  1980's   Right leg    Family History  Problem Relation Age of Onset  . Esophageal cancer Son        deceased  . Breast cancer Son        esophageal  . Diabetes Mother   . Heart disease Mother   . Kidney disease Sister   . Diabetes Father   . Hypertension Father   . Kidney disease Brother        x 3  . Colon cancer Paternal Uncle   . Breast cancer Maternal Aunt   . Breast cancer Paternal Aunt   . Breast cancer Cousin        Pt states she has 15+ cousins w/ Breast CA  . Rectal cancer Neg Hx   . Stomach cancer Neg Hx     Social History   Tobacco Use  . Smoking status: Former Smoker    Packs/day: 1.00    Years: 10.00    Pack years: 10.00    Types:  Cigarettes     Quit date: 09/22/1985    Years since quitting: 34.4  . Smokeless tobacco: Never Used  Substance Use Topics  . Alcohol use: No    Comment: 09/29/11 "used to drink socially years ago"    Subjective:  Swelling in lower extremities x 1 month; does take Lasix 40 mg daily; has noticed increased wheezing in the past 2 months; due to see cardiology next month; Denies any chest pain, blurred vision or headache.  Also worried about strong smell associated with her urine;     Objective:  Vitals:   02/12/20 1049  BP: 120/65  Pulse: 64  Temp: 97.9 F (36.6 C)  TempSrc: Oral  SpO2: 99%  Weight: 222 lb 9.6 oz (101 kg)  Height: 5' 7"  (1.702 m)    General: Well developed, well nourished, in no acute distress  Skin : Warm and dry.  Head: Normocephalic and atraumatic  Eyes: Sclera and conjunctiva clear; pupils round and reactive to light; extraocular movements intact  Ears: External normal; canals clear; tympanic membranes normal  Lungs: Respirations unlabored; clear to auscultation bilaterally without wheeze, rales, rhonchi  CVS exam: normal rate and regular rhythm.  Extremities: bilateral pedal edema, cyanosis, clubbing  Vessels: Symmetric bilaterally  Neurologic: Alert and oriented; speech intact; face symmetrical; moves all extremities well; CNII-XII intact without focal deficit   Assessment:  1. Pedal edema   2. Needs flu shot   3. Wheezing   4. Urinary body odor   5. Urinary frequency     Plan:  1. ? CHF; update CXR and labs today; patient can take extra dose of Lasix for the next 2-3 days; keep planned follow up with cardiology; 2. Flu shot given; 3. ? Cardiac or pulmonary source; increase Trelegy dosage; she should also see her pulmonologist; 4. & 5. Check U/A and urine culture;  This visit occurred during the SARS-CoV-2 public health emergency.  Safety protocols were in place, including screening questions prior to the visit, additional usage of staff PPE, and extensive  cleaning of exam room while observing appropriate contact time as indicated for disinfecting solutions.     No follow-ups on file.  Orders Placed This Encounter  Procedures  . Urine Culture    Standing Status:   Future    Number of Occurrences:   1    Standing Expiration Date:   02/11/2021  . DG Chest 2 View    Standing Status:   Future    Number of Occurrences:   1    Standing Expiration Date:   02/11/2021    Order Specific Question:   Reason for Exam (SYMPTOM  OR DIAGNOSIS REQUIRED)    Answer:   wheezing/ pedal edema    Order Specific Question:   Preferred imaging location?    Answer:   [facility]    Order Specific Question:   Radiology Contrast Protocol - do NOT remove file path    Answer:   [email or web address]  . Flu Vaccine QUAD High Dose(Fluad)  . CBC with Differential/Platelet    Standing Status:   Future    Number of Occurrences:   1    Standing Expiration Date:   02/11/2021  . Comp Met (CMET)    Standing Status:   Future    Number of Occurrences:   1    Standing Expiration Date:   02/11/2021  . B Nat Peptide    Standing Status:   Future    Number of Occurrences:   1  Standing Expiration Date:   02/11/2021  . Urinalysis    Standing Status:   Future    Number of Occurrences:   1    Standing Expiration Date:   02/11/2021    Requested Prescriptions   Signed Prescriptions Disp Refills  . Fluticasone-Umeclidin-Vilant (TRELEGY ELLIPTA) 200-62.5-25 MCG/INH AEPB 3 each 1    Sig: Inhale daily as directed  . loratadine (CLARITIN) 10 MG tablet 90 tablet 3    Sig: Take 1 tablet (10 mg total) by mouth daily.  . ferrous sulfate 325 (65 FE) MG tablet      Sig: Take 1 tablet (325 mg total) by mouth daily with breakfast.  . fluticasone (FLONASE) 50 MCG/ACT nasal spray 16 g 6    Sig: Place 2 sprays into both nostrils daily.

## 2020-02-14 ENCOUNTER — Telehealth: Payer: Self-pay | Admitting: Family

## 2020-02-14 NOTE — Telephone Encounter (Signed)
    Please call patient at [phone] with lab and xray results

## 2020-02-14 NOTE — Telephone Encounter (Signed)
Spoke with patient today. 

## 2020-02-15 ENCOUNTER — Other Ambulatory Visit: Payer: Self-pay | Admitting: Family

## 2020-02-15 LAB — COMPREHENSIVE METABOLIC PANEL
AG Ratio: 1.3 (calc) (ref 1.0–2.5)
ALT: 18 U/L (ref 6–29)
AST: 28 U/L (ref 10–35)
Albumin: 3.9 g/dL (ref 3.6–5.1)
Alkaline phosphatase (APISO): 66 U/L (ref 37–153)
BUN/Creatinine Ratio: 17 (calc) (ref 6–22)
BUN: 18 mg/dL (ref 7–25)
CO2: 29 mmol/L (ref 20–32)
Calcium: 9.4 mg/dL (ref 8.6–10.4)
Chloride: 106 mmol/L (ref 98–110)
Creat: 1.03 mg/dL — ABNORMAL HIGH (ref 0.60–0.93)
Globulin: 2.9 g/dL (calc) (ref 1.9–3.7)
Glucose, Bld: 82 mg/dL (ref 65–99)
Potassium: 4.1 mmol/L (ref 3.5–5.3)
Sodium: 142 mmol/L (ref 135–146)
Total Bilirubin: 0.5 mg/dL (ref 0.2–1.2)
Total Protein: 6.8 g/dL (ref 6.1–8.1)

## 2020-02-15 LAB — CBC WITH DIFFERENTIAL/PLATELET
Absolute Monocytes: 365 cells/uL (ref 200–950)
Basophils Absolute: 8 cells/uL (ref 0–200)
Basophils Relative: 0.2 %
Eosinophils Absolute: 59 cells/uL (ref 15–500)
Eosinophils Relative: 1.4 %
HCT: 31.1 % — ABNORMAL LOW (ref 35.0–45.0)
Hemoglobin: 10.2 g/dL — ABNORMAL LOW (ref 11.7–15.5)
Lymphs Abs: 1583 cells/uL (ref 850–3900)
MCH: 30 pg (ref 27.0–33.0)
MCHC: 32.8 g/dL (ref 32.0–36.0)
MCV: 91.5 fL (ref 80.0–100.0)
MPV: 9 fL (ref 7.5–12.5)
Monocytes Relative: 8.7 %
Neutro Abs: 2184 cells/uL (ref 1500–7800)
Neutrophils Relative %: 52 %
Platelets: 287 10*3/uL (ref 140–400)
RBC: 3.4 10*6/uL — ABNORMAL LOW (ref 3.80–5.10)
RDW: 13.8 % (ref 11.0–15.0)
Total Lymphocyte: 37.7 %
WBC: 4.2 10*3/uL (ref 3.8–10.8)

## 2020-02-15 LAB — URINE CULTURE

## 2020-02-15 LAB — URINALYSIS
Bilirubin Urine: NEGATIVE
Glucose, UA: NEGATIVE
Ketones, ur: NEGATIVE
Nitrite: POSITIVE — AB
Specific Gravity, Urine: 1.016 (ref 1.001–1.03)
pH: 5.5 (ref 5.0–8.0)

## 2020-02-15 LAB — BRAIN NATRIURETIC PEPTIDE: Brain Natriuretic Peptide: 64 pg/mL (ref <100)

## 2020-02-15 MED ORDER — SULFAMETHOXAZOLE-TRIMETHOPRIM 800-160 MG PO TABS
1.0000 | ORAL_TABLET | Freq: Two times a day (BID) | ORAL | 0 refills | Status: DC
Start: 2020-02-15 — End: 2020-02-27

## 2020-02-26 ENCOUNTER — Other Ambulatory Visit: Payer: Self-pay | Admitting: Family

## 2020-02-27 ENCOUNTER — Ambulatory Visit (INDEPENDENT_AMBULATORY_CARE_PROVIDER_SITE_OTHER): Payer: Medicare Other | Admitting: Internal Medicine

## 2020-02-27 ENCOUNTER — Encounter: Payer: Self-pay | Admitting: Internal Medicine

## 2020-02-27 ENCOUNTER — Other Ambulatory Visit: Payer: Self-pay

## 2020-02-27 VITALS — BP 120/82 | HR 67 | Ht 65.5 in | Wt 229.0 lb

## 2020-02-27 DIAGNOSIS — Q2544 Congenital dilation of aorta: Secondary | ICD-10-CM | POA: Diagnosis not present

## 2020-02-27 DIAGNOSIS — R0600 Dyspnea, unspecified: Secondary | ICD-10-CM

## 2020-02-27 DIAGNOSIS — R0609 Other forms of dyspnea: Secondary | ICD-10-CM

## 2020-02-27 DIAGNOSIS — R600 Localized edema: Secondary | ICD-10-CM

## 2020-02-27 DIAGNOSIS — R0683 Snoring: Secondary | ICD-10-CM

## 2020-02-27 DIAGNOSIS — Z23 Encounter for immunization: Secondary | ICD-10-CM | POA: Diagnosis not present

## 2020-02-27 DIAGNOSIS — Q2549 Other congenital malformations of aorta: Secondary | ICD-10-CM

## 2020-02-27 NOTE — Patient Instructions (Signed)
Medication Instructions:  No Changes In Medications at this time.  *If you need a refill on your cardiac medications before your next appointment, please call your pharmacy*  Lab Work: None Ordered At This Time.  If you have labs (blood work) drawn today and your tests are completely normal, you will receive your results only by: Marland Kitchen MyChart Message (if you have MyChart) OR . A paper copy in the mail If you have any lab test that is abnormal or we need to change your treatment, we will call you to review the results.  Testing/Procedures: Your physician has requested that you have an echocardiogram IN MARCH of 2022. Echocardiography is a painless test that uses sound waves to create images of your heart. It provides your doctor with information about the size and shape of your heart and how well your heart's chambers and valves are working. You may receive an ultrasound enhancing agent through an IV if needed to better visualize your heart during the echo.This procedure takes approximately one hour. There are no restrictions for this procedure. This will take place at the [address].   Follow-Up: At [facility], you and your health needs are our priority.  As part of our continuing mission to provide you with exceptional heart care, we have created designated Provider Care Teams.  These Care Teams include your primary Cardiologist (physician) and Advanced Practice Providers (APPs -  Physician Assistants and Nurse Practitioners) who all work together to provide you with the care you need, when you need it.  We recommend signing up for the patient portal called "MyChart".  Sign up information is provided on this After Visit Summary.  MyChart is used to connect with patients for Virtual Visits (Telemedicine).  Patients are able to view lab/test results, encounter notes, upcoming appointments, etc.  Non-urgent messages can be sent to your provider as well.   To learn more about what  you can do with MyChart, go to [email or web address].    Your next appointment:   6 month(s) following echo  The format for your next appointment:   In Person  Provider:   Cherlynn Kaiser, MD  Other Instructions Compression Stockings 15-20 mmhg- printed prescription given

## 2020-02-27 NOTE — Progress Notes (Signed)
Cardiology Office Note:    Date:  02/27/2020   ID:  [name], DOB Sep 18, 1945, MRN [id number]  PCP:  Marrian Salvage, [place]  Cardiologist:  Elouise Munroe, MD  Electrophysiologist:  None   Referring MD: Marrian Salvage,*   Chief Complaint/Reason for Referral: Follow-up shortness of breath and lower extremity edema.  History of Present Illness:    [name] is a 74 y.o. female with a history of left breast cancer with chemotherapy and radiation, GERD, hypothyroidism, mild CAD by CCTA, and diabetes mellitus type 2 diet controlled.   We started Lasix 40 mg daily at the end of January and she noticed an improvement in her lower extremity swelling, but now it has stopped working.  She does wear compression stockings but they are only minimally helpful.  Reassuring echocardiogram from January 2021. Discussed in detail whether she would like to stop Lasix or not.  She does not feel that it is helping and we discussed briefly stopping the medication and monitoring symptoms.  Discussed prescription strength compression stockings.  Continues to have exertional shortness of breath.  Pulmonary evaluation is pending, recommend PFTs for further evaluation.  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  CT angiography for coronary arteries was performed February 2020, I was able to measure her sinuses at that time.  They were 48 x 48 x 49 mm.  This suggests stability of this sinus of Valsalva dilation.  Denies chest pain.  No palpitations.  No lightheadedness or dizziness.   Past Medical History:  Diagnosis Date  . Allergy   . ANEMIA, IRON DEFICIENCY 05/08/2009  . Angina   . ASYMPTOMATIC POSTMENOPAUSAL STATUS 10/11/2008  . Blood transfusion   . Blood transfusion without reported diagnosis   . Breast cancer ([place]) 09/29/11   invasive grade III ductal ca,assoc high grade dcis,ER/PR=neg  . C. difficile colitis   . Cataract   . Diverticulosis of colon (without  mention of hemorrhage)   . Esophageal reflux 06/12/2008  . Gastroparesis   . GOITER, MULTINODULAR 04/02/2009  . Gout, unspecified   . H/O hiatal hernia   . History of lower GI bleeding   . History of radiation therapy 02/08/12-03/25/12   left breast,total 61gy  . Hypokalemia 05/11/2013  . Hypomagnesemia   . HYPOTHYROIDISM, POST-RADIATION 08/13/2009  . Internal hemorrhoids without mention of complication   . Kidney stones    "several"  . Leukopenia   . Migraines   . Obesity   . Osteoarthrosis, unspecified whether generalized or localized, unspecified site   . Other and unspecified hyperlipidemia   . Personal history of chemotherapy 2013  . Personal history of radiation therapy 2013   left  . PONV (postoperative nausea and vomiting)   . PUD (peptic ulcer disease)   . Short bowel syndrome   . Shortness of breath on exertion    "sometimes"  . Stricture and stenosis of esophagus   . Thyrotoxicosis without mention of goiter or other cause, without mention of thyrotoxic crisis or storm   . Type II or unspecified type diabetes mellitus without mention of complication, not stated as uncontrolled    no med in years diet controled  . Unspecified essential hypertension   . UTI (urinary tract infection)   . Varicose veins   . VITAMIN B12 DEFICIENCY 08/30/2009    Past Surgical History:  Procedure Laterality Date  . ABDOMINAL ADHESION SURGERY  1980's thru 1990's   "several"  . ABDOMINAL HYSTERECTOMY  1970's  with BSO  . BREAST BIOPSY Left 08/13/11   left breast lower inner quadrant  . BREAST BIOPSY Right 1985   Rt exc bx, benign  . BREAST LUMPECTOMY Left 08/2011  . BREAST LUMPECTOMY W/ NEEDLE LOCALIZATION  09/29/11   left  breast=lymph node,excision benign/ ER/PR=neg, her 2 Positive  . BUNIONECTOMY  1970's   bilateral  . CHOLECYSTECTOMY  1990's  . COLON SURGERY     "several surgeries for short bowel syndrome"  . COLONOSCOPY  2012   multiple   . DILATION AND CURETTAGE OF UTERUS      . ESOPHAGOGASTRODUODENOSCOPY  2011   multiple   . EXCISIONAL HEMORRHOIDECTOMY  11/10/2016  . EYE SURGERY     "long time ago"  . FLEXIBLE SIGMOIDOSCOPY  2011   multiple   . KIDNEY STONE SURGERY  1990's   "tried to go up & get it but pushed it further up"  . LITHOTRIPSY     "4 or 5 times"  . MASTECTOMY W/ NODES PARTIAL  09/29/11   left  . PORT-A-CATH REMOVAL Right 12/19/2013   Procedure: MINOR REMOVAL PORT-A-CATH;  Surgeon: Adin Hector, MD;  Location: [facility];  Service: General;  Laterality: Right;  . PORTACATH PLACEMENT  09/29/2011   Procedure: INSERTION PORT-A-CATH;  Surgeon: Adin Hector, MD;  Location: [facility];  Service: General;  Laterality: N/A;  . SMALL INTESTINE SURGERY    . Thyroid Ultrasound  12/1994 and 12/1995  . TOTAL KNEE ARTHROPLASTY Right 06/05/2015   Procedure: RIGHT TOTAL KNEE ARTHROPLASTY;  Surgeon: Ninetta Lights, MD;  Location: [facility];  Service: Orthopedics;  Laterality: Right;  . UPPER GASTROINTESTINAL ENDOSCOPY    . VEIN LIGATION AND STRIPPING  1980's   Right leg    Current Medications: Current Meds  Medication Sig  . allopurinol (ZYLOPRIM) 100 MG tablet Take 1 tablet (100 mg total) by mouth daily.  Marland Kitchen amitriptyline (ELAVIL) 50 MG tablet Take 1 tablet (50 mg total) by mouth at bedtime. Annual appt IS due  must see provider for future refills  . calcium carbonate (TUMS - DOSED IN MG ELEMENTAL CALCIUM) 500 MG chewable tablet Chew 1 tablet by mouth as needed for indigestion or heartburn.  . Calcium Carbonate-Vitamin D (CALCIUM-VITAMIN D) 600-200 MG-UNIT CAPS Take 1 capsule by mouth daily.    . Cyanocobalamin (VITAMIN B 12 PO) Take 1 tablet by mouth 2 (two) times daily.   . cyclobenzaprine (FLEXERIL) 5 MG tablet Take 1-2 tablets (5-10 mg total) by mouth 3 (three) times daily as needed for muscle spasms. Take mostly at bedtime.  . dicyclomine (BENTYL) 20 MG tablet Take 1 tablet (20 mg total) by mouth 3 (three) times daily as needed for spasms.  Marland Kitchen  esomeprazole (NEXIUM) 40 MG capsule Take 1 capsule (40 mg total) by mouth 2 (two) times daily before a meal.  . ferrous sulfate 325 (65 FE) MG tablet Take 1 tablet (325 mg total) by mouth daily with breakfast.  . fluticasone (FLONASE) 50 MCG/ACT nasal spray Place 2 sprays into both nostrils daily.  . Fluticasone-Umeclidin-Vilant (TRELEGY ELLIPTA) 100-62.5-25 MCG/INH AEPB Inhale 1 puff into the lungs daily.  . Fluticasone-Umeclidin-Vilant (TRELEGY ELLIPTA) 200-62.5-25 MCG/INH AEPB Inhale daily as directed  . furosemide (LASIX) 40 MG tablet Take 1 tablet (40 mg total) by mouth daily.  Marland Kitchen HYDROcodone-acetaminophen (NORCO/VICODIN) 5-325 MG tablet Take 1 tablet by mouth every 6 (six) hours as needed.  Marland Kitchen levothyroxine (SYNTHROID) 175 MCG tablet Take 1 tablet (175 mcg total) by mouth  daily before breakfast.  . loratadine (CLARITIN) 10 MG tablet Take 1 tablet (10 mg total) by mouth daily.  Marland Kitchen lovastatin (MEVACOR) 20 MG tablet TAKE ONE TABLET BY MOUTH EVERY DAY AT 6:00PM  . Magnesium 500 MG CAPS Take 1 mg by mouth 2 (two) times daily.   . nitroGLYCERIN (NITROSTAT) 0.4 MG SL tablet Place 1 tablet (0.4 mg total) under the tongue every 5 (five) minutes as needed.  . pantoprazole (PROTONIX) 40 MG tablet TAKE 1 TABLET BY MOUTH 30 MINUTES PRIOR TO BREAKFAST AND SUPPER  . pregabalin (LYRICA) 75 MG capsule Take 1 capsule (75 mg total) by mouth 2 (two) times daily. For nerve pain  . Thiamine HCl (THIAMINE PO) Take 1 tablet by mouth daily.      Allergies:   Aspirin, Trazodone and nefazodone, Flagyl [metronidazole], and Morphine and related   Social History   Tobacco Use  . Smoking status: Former Smoker    Packs/day: 1.00    Years: 10.00    Pack years: 10.00    Types: Cigarettes    Quit date: 09/22/1985    Years since quitting: 34.4  . Smokeless tobacco: Never Used  Vaping Use  . Vaping Use: Never used  Substance Use Topics  . Alcohol use: No    Comment: 09/29/11 "used to drink socially years ago"  . Drug  use: No     Family History: The patient's family history includes Breast cancer in her cousin, maternal aunt, paternal aunt, and son; Colon cancer in her paternal uncle; Diabetes in her father and mother; Esophageal cancer in her son; Heart disease in her mother; Hypertension in her father; Kidney disease in her brother and sister. There is no history of Rectal cancer or Stomach cancer.  ROS:   Please see the history of present illness.    All other systems reviewed and are negative.  EKGs/Labs/Other Studies Reviewed:    The following studies were reviewed today:  EKG: Sinus rhythm, LVH  Recent Labs: 08/17/2019: Magnesium 1.7 12/18/2019: TSH 12.91 02/12/2020: ALT 18; Brain Natriuretic Peptide 64; BUN 18; Creat 1.03; Hemoglobin 10.2; Platelets 287; Potassium 4.1; Sodium 142  Recent Lipid Panel    Component Value Date/Time   CHOL 150 03/17/2018 1200   TRIG 112.0 03/17/2018 1200   HDL 75.50 03/17/2018 1200   CHOLHDL 2 03/17/2018 1200   VLDL 22.4 03/17/2018 1200   LDLCALC 52 03/17/2018 1200   LDLDIRECT 53.0 10/29/2015 1338    Physical Exam:    VS:  BP 120/82   Pulse 67   Ht 5' 5.5" (1.664 m)   Wt 229 lb (103.9 kg)   SpO2 98%   BMI 37.53 kg/m     Wt Readings from Last 5 Encounters:  02/27/20 229 lb (103.9 kg)  02/12/20 222 lb 9.6 oz (101 kg)  12/18/19 226 lb (102.5 kg)  12/18/19 226 lb (102.5 kg)  12/05/19 218 lb 12.8 oz (99.2 kg)    Constitutional: No acute distress Eyes: sclera non-icteric, normal conjunctiva and lids ENMT: normal dentition, moist mucous membranes Cardiovascular: regular rhythm, normal rate, no murmurs. S1 and S2 normal. Radial pulses normal bilaterally. No jugular venous distention.  Respiratory: clear to auscultation bilaterally GI : normal bowel sounds, soft and nontender. No distention.   MSK: extremities warm, well perfused.  2+ pedal edema.  NEURO: grossly nonfocal exam, moves all extremities. PSYCH: alert and oriented x 3, normal mood and  affect.   ASSESSMENT:    1. Bilateral leg edema   2. Dilatation  of aortic sinus of Valsalva   3. Dyspnea on exertion   4. Snoring    PLAN:    Bilateral leg edema - Plan: EKG 12-Lead, Compression stockings Will provide prescription for compression stockings.  She may need a tighter fitting compression to mitigate lower extremity swelling.  This is likely due to venous insufficiency, no strongly suggestive features on echo of a cardiac origin.  Dilatation of aortic sinus of Valsalva - Plan: Compression stockings, ECHOCARDIOGRAM COMPLETE She will need an annual echocardiogram to evaluate sinus of Valsalva dilation.  If stable on the next echo, could consider spacing out follow-up echoes.  Dyspnea on exertion -No clear cardiovascular origin, recommend pulmonary follow-up  Snoring -She was unable to complete sleep study and feels she will not tolerate a CPAP mask.  We discussed implications of untreated sleep apnea, and that this may be playing a role in LE edema and SOB.  Recommend sleep study when she is ready, this can also be pursued through pulmonary if she so prefers.  Total time of encounter: 30 minutes total time of encounter, including 25 minutes spent in face-to-face patient care on the date of this encounter. This time includes coordination of care and counseling regarding above mentioned problem list. Remainder of non-face-to-face time involved reviewing chart documents/testing relevant to the patient encounter and documentation in the medical record. I have independently reviewed documentation from referring provider.   Cherlynn Kaiser, MD [place]  [facility]    Medication Adjustments/Labs and Tests Ordered: Current medicines are reviewed at length with the patient today.  Concerns regarding medicines are outlined above.   Orders Placed This Encounter  Procedures  . Compression stockings  . EKG 12-Lead  . ECHOCARDIOGRAM COMPLETE    No orders of the defined  types were placed in this encounter.   Patient Instructions  Medication Instructions:  No Changes In Medications at this time.  *If you need a refill on your cardiac medications before your next appointment, please call your pharmacy*  Lab Work: None Ordered At This Time.  If you have labs (blood work) drawn today and your tests are completely normal, you will receive your results only by: Marland Kitchen MyChart Message (if you have MyChart) OR . A paper copy in the mail If you have any lab test that is abnormal or we need to change your treatment, we will call you to review the results.  Testing/Procedures: Your physician has requested that you have an echocardiogram IN MARCH of 2022. Echocardiography is a painless test that uses sound waves to create images of your heart. It provides your doctor with information about the size and shape of your heart and how well your heart's chambers and valves are working. You may receive an ultrasound enhancing agent through an IV if needed to better visualize your heart during the echo.This procedure takes approximately one hour. There are no restrictions for this procedure. This will take place at the [address].   Follow-Up: At [facility], you and your health needs are our priority.  As part of our continuing mission to provide you with exceptional heart care, we have created designated Provider Care Teams.  These Care Teams include your primary Cardiologist (physician) and Advanced Practice Providers (APPs -  Physician Assistants and Nurse Practitioners) who all work together to provide you with the care you need, when you need it.  We recommend signing up for the patient portal called "MyChart".  Sign up information is provided on  this After Visit Summary.  MyChart is used to connect with patients for Virtual Visits (Telemedicine).  Patients are able to view lab/test results, encounter notes, upcoming appointments, etc.  Non-urgent messages  can be sent to your provider as well.   To learn more about what you can do with MyChart, go to [email or web address].    Your next appointment:   6 month(s) following echo  The format for your next appointment:   In Person  Provider:   Cherlynn Kaiser, MD  Other Instructions Compression Stockings 15-20 mmhg- printed prescription given

## 2020-03-04 ENCOUNTER — Encounter: Payer: Self-pay | Admitting: Internal Medicine

## 2020-03-04 ENCOUNTER — Other Ambulatory Visit: Payer: Self-pay

## 2020-03-04 ENCOUNTER — Ambulatory Visit (INDEPENDENT_AMBULATORY_CARE_PROVIDER_SITE_OTHER): Payer: Medicare Other | Admitting: Internal Medicine

## 2020-03-04 VITALS — BP 124/76 | HR 95 | Temp 97.0°F | Ht 66.0 in | Wt 228.6 lb

## 2020-03-04 DIAGNOSIS — R0600 Dyspnea, unspecified: Secondary | ICD-10-CM | POA: Diagnosis not present

## 2020-03-04 DIAGNOSIS — K219 Gastro-esophageal reflux disease without esophagitis: Secondary | ICD-10-CM | POA: Diagnosis not present

## 2020-03-04 DIAGNOSIS — R0609 Other forms of dyspnea: Secondary | ICD-10-CM

## 2020-03-04 DIAGNOSIS — R609 Edema, unspecified: Secondary | ICD-10-CM | POA: Diagnosis not present

## 2020-03-04 MED ORDER — BREZTRI AEROSPHERE 160-9-4.8 MCG/ACT IN AERO: 2.0000 | INHALATION_SPRAY | Freq: Two times a day (BID) | RESPIRATORY_TRACT | 0 refills | Status: DC

## 2020-03-04 MED ORDER — ALBUTEROL SULFATE HFA 108 (90 BASE) MCG/ACT IN AERS: 2.0000 | INHALATION_SPRAY | Freq: Four times a day (QID) | RESPIRATORY_TRACT | 4 refills | Status: DC | PRN

## 2020-03-04 NOTE — Patient Instructions (Addendum)
Order- Schedule 6 MWT- dx   Dyspnea on exertion  Samples x 2 Breztri inhaler--  Inhale 2 puffs, then rinse mouth, twice every day. Try this instead of Trelegy. See if it works any better for you, especially with wheezing at night.   Order- schedule PFT   Dx Dyspnea on exertion  Order- Schedule overnight oximetry   Dx Dyspnea on exertion  Please work with your GI doctor to get that heart burn and reflux under control  Please call if we can help

## 2020-03-04 NOTE — Assessment & Plan Note (Signed)
Working with cardiology.

## 2020-03-04 NOTE — Assessment & Plan Note (Signed)
She describes this as bad especially at night, although she elevates with afdjustable bed Plan- f/u with GI.

## 2020-03-04 NOTE — Assessment & Plan Note (Addendum)
She didn't want to do sleep study, but agrees to overnight oximetry. Trelegy helps some, suggesting there may be some bronchospasm, although fluid retention, cardiac issues, deconditioning also contribute. We will try Breztri to see if twice daily admin works any better. Reflux may contribute to wheeze at night. Plan- overnight oximetry, PFT 6 MWT

## 2020-03-04 NOTE — Progress Notes (Signed)
07/06/19- 22 yoF former smoker ( 20 pk yr, quit 1980) referred for shortness of breath by Dr Acharya/ Cardiology for eval of Dyspnea and wheezing Medical problem list includes HTN, Hiatal Hernia, GERD/ stricture, Gatroparesis, Hx C.diff colitis, Goiter/ Hypothyroid p RAI, DM2, Eczema, Breast Cancer L/ chemo/ XRT, FE def Anemia, Renal Insuficiency,  ECHO- nl w mild AR,  Negative ischemia w/u w non-obstructive CAD on CCTA,  ------SOB for 2 months off and on .  Arrival room air sat 99% Has had flu vax and 1 Covid vax More aware of dyspnea on exertion x 2-3 months walking, dressing, stairs. Occ a little wheeze, not much cough,no sputum. No acute illness associated.  Some seasonal rhinitis, spring and fall.  No hx pneumonia. Had XRT breast 2013.  Aware she snores, lives alone. Rarely wakes SOB, relieved by sitting up. Has adjustable bed, 2 pillows. Some daytime tiredness, not limiting her.  Worked as CMA.  Weight up and down x years. Recognizes reflux/ heart burn which has awakened her and choked her at night.  CXR 02/07/2019-  Cardiac shadows within normal limits. Aortic calcifications are noted. The lungs are clear bilaterally. No acute bony abnormality is seen. IMPRESSION: No acute abnormality noted.  03/04/20- 18 yoF former smoker ( 20 pk yr, quit 1980) referred for shortness of breath by Dr Acharya/ Cardiology for eval of Dyspnea and wheezing, complicated by HTN, Hiatal Hernia, GERD/ stricture, Gatroparesis, Hx C.diff colitis, Goiter/ Hypothyroid p RAI, DM2, Eczema, Breast Cancer L/ chemo/ XRT, FE def Anemia, Renal Insuficiency,  ECHO- nl w mild AR,  Negative ischemia w/u w non-obstructive CAD on CCTA,  -----pt is on fliuid medication and sob and cardiologist suggested seeing pulmnologist to run test Body weight today 228 lbs    Arrival sat RA 98% Sleep study 09/11/19- She didn't want it done. At our last visit given sample Trlegy. PFT and 6MWT were ordered but not done Covid vax 3 Phizer Flu  vax- done Admits reflux is "bad" at night. Has adjustable bed and working with GI. Trelegy 100 is some help for dyspnea. Wheeze is worst at night. Cardiloogy working with her on fluid retention/ leg edema- elastic hose.  CXR 02/12/20-  No focal opacity or pleural effusion. Stable cardiomediastinal silhouette with aortic atherosclerosis. No pneumothorax. IMPRESSION: No active cardiopulmonary disease.  ROS-see HPI   + = positive Constitutional:    weight loss, night sweats, fevers, chills, +fatigue, lassitude. HEENT:    headaches, difficulty swallowing, tooth/dental problems, sore throat,       sneezing, itching, ear ache, nasal congestion, post nasal drip, snoring CV:    chest pain, orthopnea, PND, +swelling in lower extremities, anasarca,                                  dizziness, palpitations Resp:  + shortness of breath with exertion or at rest.                productive cough,   non-productive cough, coughing up of blood.              change in color of mucus.  wheezing.   Skin:    rash or lesions. GI:  + heartburn, indigestion, abdominal pain, nausea, vomiting, diarrhea,                 change in bowel habits, loss of appetite GU: dysuria, change in color of urine, no urgency or frequency.  flank pain. MS:   joint pain, stiffness, decreased range of motion, back pain. Neuro-     nothing unusual Psych:  change in mood or affect.  depression or anxiety.   memory loss.  OBJ- Physical Exam General- Alert, Oriented, Affect-appropriate, Distress- none acute, + obese Skin- rash-none, lesions- none, excoriation- none Lymphadenopathy- none Head- atraumatic            Eyes- Gross vision intact, PERRLA, conjunctivae and secretions clear            Ears- Hearing, canals-normal            Nose- Clear, no-Septal dev, mucus, polyps, erosion, perforation             Throat- Mallampati III-IV , mucosa clear , drainage- none, tonsils- atrophic, + teeth Neck- flexible , trachea midline, no  stridor , thyroid nl, carotid no bruit Chest - symmetrical excursion , unlabored           Heart/CV- RRR , no murmur , no gallop  , no rub, nl s1 s2                           -JVD+1cm , edema- none, stasis changes- none, varices- none           Lung- clear to P&A, wheeze- none, cough- none , dullness-none, rub- none           Chest wall-  Abd-  Br/ Gen/ Rectal- Not done, not indicated Extrem- cyanosis- none, clubbing, none, atrophy- none, strength- nl, + compression stockings Neuro- grossly intact to observation

## 2020-03-07 ENCOUNTER — Encounter: Payer: Self-pay | Admitting: Internal Medicine

## 2020-03-07 DIAGNOSIS — R0683 Snoring: Secondary | ICD-10-CM | POA: Diagnosis not present

## 2020-03-07 DIAGNOSIS — G4730 Sleep apnea, unspecified: Secondary | ICD-10-CM | POA: Diagnosis not present

## 2020-03-14 ENCOUNTER — Ambulatory Visit: Payer: Medicare Other | Admitting: Internal Medicine

## 2020-03-18 DIAGNOSIS — N182 Chronic kidney disease, stage 2 (mild): Secondary | ICD-10-CM | POA: Diagnosis not present

## 2020-03-18 DIAGNOSIS — N2581 Secondary hyperparathyroidism of renal origin: Secondary | ICD-10-CM | POA: Diagnosis not present

## 2020-03-22 ENCOUNTER — Ambulatory Visit (INDEPENDENT_AMBULATORY_CARE_PROVIDER_SITE_OTHER): Payer: Medicare Other | Admitting: Internal Medicine

## 2020-03-22 ENCOUNTER — Other Ambulatory Visit: Payer: Self-pay

## 2020-03-22 DIAGNOSIS — R0600 Dyspnea, unspecified: Secondary | ICD-10-CM | POA: Diagnosis not present

## 2020-03-22 DIAGNOSIS — R0609 Other forms of dyspnea: Secondary | ICD-10-CM

## 2020-03-22 LAB — PULMONARY FUNCTION TEST
DL/VA % pred: 91 %
DL/VA: 3.72 ml/min/mmHg/L
DLCO cor % pred: 71 %
DLCO cor: 14.08 ml/min/mmHg
DLCO unc % pred: 63 %
DLCO unc: 12.47 ml/min/mmHg
FEF 25-75 Post: 2.13 L/sec
FEF 25-75 Pre: 1.37 L/sec
FEF2575-%Change-Post: 55 %
FEF2575-%Pred-Post: 135 %
FEF2575-%Pred-Pre: 87 %
FEV1-%Change-Post: 12 %
FEV1-%Pred-Post: 92 %
FEV1-%Pred-Pre: 82 %
FEV1-Post: 1.66 L
FEV1-Pre: 1.48 L
FEV1FVC-%Change-Post: 3 %
FEV1FVC-%Pred-Pre: 104 %
FEV6-%Change-Post: 8 %
FEV6-%Pred-Post: 90 %
FEV6-%Pred-Pre: 83 %
FEV6-Post: 2.02 L
FEV6-Pre: 1.86 L
FEV6FVC-%Change-Post: 0 %
FEV6FVC-%Pred-Post: 104 %
FEV6FVC-%Pred-Pre: 104 %
FVC-%Change-Post: 8 %
FVC-%Pred-Post: 87 %
FVC-%Pred-Pre: 80 %
FVC-Post: 2.02 L
FVC-Pre: 1.86 L
Post FEV1/FVC ratio: 82 %
Post FEV6/FVC ratio: 100 %
Pre FEV1/FVC ratio: 80 %
Pre FEV6/FVC Ratio: 100 %
RV % pred: 93 %
RV: 2.19 L
TLC % pred: 82 %
TLC: 4.3 L

## 2020-03-22 NOTE — Progress Notes (Signed)
PFT done today. 

## 2020-03-25 ENCOUNTER — Ambulatory Visit (INDEPENDENT_AMBULATORY_CARE_PROVIDER_SITE_OTHER): Payer: Medicare Other | Admitting: Obstetrics & Gynecology

## 2020-03-25 ENCOUNTER — Encounter: Payer: Self-pay | Admitting: Obstetrics & Gynecology

## 2020-03-25 ENCOUNTER — Other Ambulatory Visit: Payer: Self-pay

## 2020-03-25 VITALS — BP 128/78 | Ht 66.0 in | Wt 226.0 lb

## 2020-03-25 DIAGNOSIS — Z01419 Encounter for gynecological examination (general) (routine) without abnormal findings: Secondary | ICD-10-CM

## 2020-03-25 DIAGNOSIS — Z9079 Acquired absence of other genital organ(s): Secondary | ICD-10-CM

## 2020-03-25 DIAGNOSIS — R87610 Atypical squamous cells of undetermined significance on cytologic smear of cervix (ASC-US): Secondary | ICD-10-CM | POA: Diagnosis not present

## 2020-03-25 DIAGNOSIS — Z90722 Acquired absence of ovaries, bilateral: Secondary | ICD-10-CM

## 2020-03-25 DIAGNOSIS — Z90710 Acquired absence of both cervix and uterus: Secondary | ICD-10-CM

## 2020-03-25 DIAGNOSIS — Z9289 Personal history of other medical treatment: Secondary | ICD-10-CM

## 2020-03-25 DIAGNOSIS — C50912 Malignant neoplasm of unspecified site of left female breast: Secondary | ICD-10-CM

## 2020-03-25 DIAGNOSIS — Z1272 Encounter for screening for malignant neoplasm of vagina: Secondary | ICD-10-CM

## 2020-03-25 DIAGNOSIS — Z13820 Encounter for screening for osteoporosis: Secondary | ICD-10-CM

## 2020-03-25 DIAGNOSIS — Z780 Asymptomatic menopausal state: Secondary | ICD-10-CM

## 2020-03-25 DIAGNOSIS — Z853 Personal history of malignant neoplasm of breast: Secondary | ICD-10-CM

## 2020-03-25 DIAGNOSIS — Z6836 Body mass index (BMI) 36.0-36.9, adult: Secondary | ICD-10-CM

## 2020-03-25 NOTE — Addendum Note (Signed)
Addended by: Lorine Bears on: 03/25/2020 11:48 AM   Modules accepted: Orders

## 2020-03-25 NOTE — Progress Notes (Signed)
[name] [birth year] [id number]   History:    74 y.o. [id number] Widowed.    RP: Established patient presenting for annual gyn exam   HPI:  H/O TAH/BSO in 1970's.  H/O abnormal Paps before Hysterectomy per patient.  Pap test 03/2019 ASCUS/HPV HR Neg.  H/O Left Breast Ca in 20-Sep-2011.  Breasts normal currently.  Mammo 10/2019 Neg.  Abstinent.  No pelvic pain.  Urine and BMs normal.  [place] 2020.  BMI 36.48.  Not physically active.  BD normal in 09/19/13.  Health labs with Fam MD. Under investigation and treatment for shortness of breath.  Past medical history,surgical history, family history and social history were all reviewed and documented in the EPIC chart.  Gynecologic History No LMP recorded. Patient has had a hysterectomy.  Obstetric History OB History  Gravida Para Term Preterm AB Living  4 3     1 2   SAB TAB Ectopic Multiple Live Births  1            # Outcome Date GA Lbr Len/2nd Weight Sex Delivery Anes PTL Lv  4 SAB           3 Para           2 Para           1 Para             Obstetric Comments  Oldest son passed away in 09/20/06 from esophageal cancer     ROS: A ROS was performed and pertinent positives and negatives are included in the history.  GENERAL: No fevers or chills. HEENT: No change in vision, no earache, sore throat or sinus congestion. NECK: No pain or stiffness. CARDIOVASCULAR: No chest pain or pressure. No palpitations. PULMONARY: No shortness of breath, cough or wheeze. GASTROINTESTINAL: No abdominal pain, nausea, vomiting or diarrhea, melena or bright red blood per rectum. GENITOURINARY: No urinary frequency, urgency, hesitancy or dysuria. MUSCULOSKELETAL: No joint or muscle pain, no back pain, no recent trauma. DERMATOLOGIC: No rash, no itching, no lesions. ENDOCRINE: No polyuria, polydipsia, no heat or cold intolerance. No recent change in weight. HEMATOLOGICAL: No anemia or easy bruising or bleeding. NEUROLOGIC: No headache, seizures, numbness,  tingling or weakness. PSYCHIATRIC: No depression, no loss of interest in normal activity or change in sleep pattern.     Exam:   BP 128/78 (BP Location: Right Arm, Patient Position: Sitting, Cuff Size: Normal)   Ht 5\' 6"  (1.676 m)   Wt 226 lb (102.5 kg)   BMI 36.48 kg/m   Body mass index is 36.48 kg/m.  General appearance : Well developed well nourished female. No acute distress HEENT: Eyes: no retinal hemorrhage or exudates,  Neck supple, trachea midline, no carotid bruits, no thyroidmegaly Lungs: Clear to auscultation, no rhonchi or wheezes, or rib retractions  Heart: Regular rate and rhythm, no murmurs or gallops Breast:Examined in sitting and supine position were symmetrical in appearance, no palpable masses or tenderness,  no skin retraction, no nipple inversion, no nipple discharge, no skin discoloration, no axillary or supraclavicular lymphadenopathy Abdomen: no palpable masses or tenderness, no rebound or guarding Extremities: no edema or skin discoloration or tenderness  Pelvic: Vulva: Normal             Vagina: No gross lesions or discharge.  Pap reflex done.  Cervix/Uterus absent  Adnexa  Without masses or tenderness  Anus: Normal   Assessment/Plan:  74 y.o. female for annual exam   1. Encounter for Papanicolaou  smear of vagina as part of routine gynecological examination Gynecologic exam status post TAH/BSO.  Pap test done at the vaginal vault because of ASCUS with negative high-risk HPV last year.  Breast exam normal, status post left lumpectomy for left breast cancer in 2013.  Screening mammogram June 2021 was negative.  Colonoscopy 2020.  Health labs with family physician.  2. ASCUS of cervix with negative high risk HPV Pap reflex done today.  3. S/P TAH-BSO  4. Postmenopause Well on no hormone replacement therapy.  5. Screening for osteoporosis Bone density normal in 2015.  Schedule bone density here.  Vitamin D supplements, calcium intake of 1500 mg daily  and regular weightbearing physical activities recommended. - DG Bone Density; Future  6. Malignant neoplasm of left female breast, unspecified estrogen receptor status, unspecified site of breast ([place]) Left breast Ca in 2013.  7. Class 2 severe obesity due to excess calories with serious comorbidity and body mass index (BMI) of 36.0 to 36.9 in adult [facility]) Recommend a lower calorie/carb diet.  Low level of physical activity with difficulty breathing under investigation and treatment currently.  Princess Bruins MD, 11:03 AM 03/25/2020

## 2020-03-26 DIAGNOSIS — I129 Hypertensive chronic kidney disease with stage 1 through stage 4 chronic kidney disease, or unspecified chronic kidney disease: Secondary | ICD-10-CM | POA: Diagnosis not present

## 2020-03-26 DIAGNOSIS — D631 Anemia in chronic kidney disease: Secondary | ICD-10-CM | POA: Diagnosis not present

## 2020-03-26 DIAGNOSIS — N2581 Secondary hyperparathyroidism of renal origin: Secondary | ICD-10-CM | POA: Diagnosis not present

## 2020-03-26 DIAGNOSIS — N1831 Chronic kidney disease, stage 3a: Secondary | ICD-10-CM | POA: Diagnosis not present

## 2020-03-27 LAB — PAP IG W/ RFLX HPV ASCU

## 2020-04-01 ENCOUNTER — Telehealth: Payer: Self-pay | Admitting: Internal Medicine

## 2020-04-01 DIAGNOSIS — G4734 Idiopathic sleep related nonobstructive alveolar hypoventilation: Secondary | ICD-10-CM

## 2020-04-01 NOTE — Telephone Encounter (Signed)
Pt calling requesting to know the results of ONO and PFT that were recently performed. Dr. Annamaria Boots, please advise.

## 2020-04-03 ENCOUNTER — Telehealth: Payer: Self-pay | Admitting: Family Medicine

## 2020-04-03 ENCOUNTER — Telehealth: Payer: Self-pay

## 2020-04-03 MED ORDER — PREGABALIN 75 MG PO CAPS
75.0000 mg | ORAL_CAPSULE | Freq: Two times a day (BID) | ORAL | 3 refills | Status: DC
Start: 2020-04-03 — End: 2020-04-29

## 2020-04-03 NOTE — Telephone Encounter (Signed)
Called and spoke with the pt and notified her of results of her PFT's I have sent a community msg to [place] [facility] [place] to her her ONO results

## 2020-04-03 NOTE — Telephone Encounter (Signed)
PFT was almost normal with slight slowing of airflow and oxygen exchange.  Please try to track down the overnight oximetry result- I don't see it yet.

## 2020-04-03 NOTE — Telephone Encounter (Signed)
Spoke c pt and informed her Dr. Georgina Snell printed and sent the script to [facility].

## 2020-04-03 NOTE — Telephone Encounter (Signed)
Pt would like Lyrica refilled, please send to [facility] on file (not [facility]).

## 2020-04-03 NOTE — Telephone Encounter (Signed)
IF you want to print it, we can mail it from up front.

## 2020-04-03 NOTE — Telephone Encounter (Signed)
Error

## 2020-04-03 NOTE — Telephone Encounter (Signed)
[facility] does not accept electronic prescribing of Lyrica.  I have printed the prescription and will mail it to the mail order pharmacy.  Please let me know if you have a problem with it.

## 2020-04-05 NOTE — Telephone Encounter (Signed)
Still can't find any ONO report on Mrs Gaccione

## 2020-04-05 NOTE — Telephone Encounter (Signed)
Dr. Annamaria Boots, did you receive ONO?

## 2020-04-05 NOTE — Telephone Encounter (Signed)
I have spoken to [facility] with [facility] and requested ONO results. results will be faxed to [facility] office.

## 2020-04-05 NOTE — Telephone Encounter (Signed)
Overnight oximetry shows about 8 minutes during sleep when oxygen saturation was mostly normal but was low enough to qualify for a trial of home O2 if she wants to see if sleeping with it will help her feel better.  Order- new DME [facility] new home O2 2L for sleep   Dx nocturnal hypoxemia Please make sure she has office ef/u in 2-3 months

## 2020-04-10 NOTE — Telephone Encounter (Signed)
Patient is aware of results and voiced her understanding.  Patient is agreeable with trail of nocturnal oxygen. Patient has pending OV for 07/18/2020 at 9:30.   Dr. Annamaria Boots, please verify dx that you would like to use for nocturnal oxygen? DOE will not work. Thanks

## 2020-04-10 NOTE — Telephone Encounter (Signed)
Use dx Nocturnal Hypoxemia

## 2020-04-10 NOTE — Telephone Encounter (Signed)
Order has been placed to [facility] for 2L QHS. Nothing further needed.

## 2020-04-29 MED ORDER — PREGABALIN 75 MG PO CAPS: 75.0000 mg | ORAL_CAPSULE | Freq: Two times a day (BID) | ORAL | 3 refills | Status: DC

## 2020-04-29 NOTE — Telephone Encounter (Signed)
Patient called stating that [facility] has not received the prescription. She asked if it could be sent again or called in?

## 2020-04-29 NOTE — Telephone Encounter (Signed)
I have printed the prescription again.  It looks like there may be a form that you need to fill out and send with the prescription.  I have printed the form and the new prescription and have a address the letter to the correct address for the pharmacy.  Please go by my office and pick up the letter and fill the form out and send it along with the prescription to the mail order pharmacy.

## 2020-04-29 NOTE — Addendum Note (Signed)
Addended by: Gregor Hams on: 04/29/2020 01:29 PM   Modules accepted: Orders

## 2020-04-30 NOTE — Telephone Encounter (Signed)
Contacted pt and she states that she got the Lyrical medication yesterday so she's good to go.

## 2020-05-14 ENCOUNTER — Telehealth: Payer: Self-pay | Admitting: Family

## 2020-05-14 NOTE — Telephone Encounter (Signed)
Left patient a voicemail to return office call in regards to orders being received. If patient calls back please tell her for her hip and knee orders to call and schedule with Dr. Georgina Snell.

## 2020-05-14 NOTE — Telephone Encounter (Signed)
We received a number of orders asking for hip and knee orthotic products. I don't sign these type of orders. She needs to come discuss with sports medicine if she is having problems so they can determine best course of treatment. She is a patient of Dr. Georgina Snell and can make her own appointment.

## 2020-05-21 NOTE — Progress Notes (Signed)
I, [name], LAT, ATC, am serving as scribe for Dr. [name].  [name] is a 74 y.o. female who presents to [place] at [facility] today for f/u of L hip and L leg pain / lumbar radiculopathy.  She was last seen by Dr. Georgina Snell on 12/18/19 to review her L-spine and L hip MRI and was referred for an ESI that she had on 12/26/19.  Since then, pt reports that she had relief w/ the ESI until about one month ago.  Her pain is currently running from her R lateral hip down to her R proximal, lateral calf.  She reports numbness in her B feet and B feet swelling.  She states that sometimes she walks "bent over" and notes that she will occasionally need to use a cane to help w/ her walking.  Diagnostic testing: L hip and L-spine MRI- 12/09/19; L hip and L-spine XR- 12/05/19   Pertinent review of systems: No fevers or chills  Relevant historical information: Hypertension   Exam:  BP 112/82 (BP Location: Right Arm, Patient Position: Sitting, Cuff Size: Large)   Pulse 80   Ht 5\' 6"  (1.676 m)   Wt 234 lb 6.4 oz (106.3 kg)   SpO2 97%   BMI 37.83 kg/m  General: Well Developed, well nourished, and in no acute distress.   MSK: Left hip normal-appearing not particular tender greater trochanter. L-spine positive slump test.  Lower extremity strength is intact.    Lab and Radiology Results EXAM: MRI LUMBAR SPINE WITHOUT CONTRAST  TECHNIQUE: Multiplanar, multisequence MR imaging of the lumbar spine was performed. No intravenous contrast was administered.  COMPARISON:  Radiography 12/05/2019  FINDINGS: Segmentation:  5 lumbar type vertebral bodies.  Alignment:  Normal  Vertebrae:  Normal  Conus medullaris and cauda equina: Conus extends to the L1 level. Conus and cauda equina appear normal.  Paraspinal and other soft tissues: Normal  Disc levels:  No abnormality at L1-2 or above.  L2-3: Bulging of the disc more prominent towards the left.  Facet degeneration and hypertrophy. Mild narrowing of the left lateral recess but no gross neural compression. The facet arthritis could be symptomatic.  L3-4: Bulging of the disc. Bilateral facet and ligamentous hypertrophy. Mild stenosis of both lateral recesses without definite neural compression. The facet arthritis could be symptomatic.  L4-5: Bulging of the disc. Facet and ligamentous hypertrophy. Mild stenosis of both lateral recesses but without definite neural compression. The facet arthritis could be symptomatic.  L5-S1: No disc bulge or herniation. Bilateral facet osteoarthritis without encroachment upon the neural spaces. The arthritis could be symptomatic.  IMPRESSION: Disc bulges from L2-3 through L4-5. Facet and ligamentous hypertrophy at those levels. Narrowing of the left lateral recess at L2-3, both lateral recesses at L3-4 and both lateral recesses at L4-5, but without visible neural compression. The facet arthritis at those levels as well as at L5-S1 could be a cause of back pain or referred facet syndrome.   Electronically Signed   By: Nelson Chimes M.D.   On: 12/10/2019 15:50 I, [name], personally (independently) visualized and performed the interpretation of the images attached in this note.     Assessment and Plan: 74 y.o. female with left leg pain thought to be due to lumbar radiculopathy at L4-5.  She had excellent response to epidural steroid injection at L4-5 level in July.  Plan for repeat injection in the near future.  Patient already has Lyrica.  Limited hydrocodone for now.  Recheck near future as needed.  PDMP reviewed during this encounter. Orders Placed This Encounter  Procedures  . [place] LIMITED JOINT SPACE STRUCTURES LOW LEFT(NO LINKED CHARGES)    Standing Status:   Future    Number of Occurrences:   1    Standing Expiration Date:   11/20/2020    Order Specific Question:   Reason for Exam (SYMPTOM  OR DIAGNOSIS REQUIRED)     Answer:   chronic left hip pain    Order Specific Question:   Preferred imaging location?    Answer:   Why  . DG INJECT DIAG/THERA/INC NEEDLE/CATH/PLC EPI/LUMB/SAC W/IMG    Standing Status:   Future    Standing Expiration Date:   05/22/2021    Order Specific Question:   Reason for Exam (SYMPTOM  OR DIAGNOSIS REQUIRED)    Answer:   left at L4-5 # 2    Order Specific Question:   Preferred Imaging Location?    Answer:   [id number]    Order Specific Question:   Radiology Contrast Protocol - do NOT remove file path    Answer:   [email or web address]   Meds ordered this encounter  Medications  . HYDROcodone-acetaminophen (NORCO/VICODIN) 5-325 MG tablet    Sig: Take 1 tablet by mouth every 6 (six) hours as needed.    Dispense:  15 tablet    Refill:  0     Discussed warning signs or symptoms. Please see discharge instructions. Patient expresses understanding.   The above documentation has been reviewed and is accurate and complete [name], M.D.

## 2020-05-22 ENCOUNTER — Other Ambulatory Visit: Payer: Self-pay

## 2020-05-22 ENCOUNTER — Ambulatory Visit (INDEPENDENT_AMBULATORY_CARE_PROVIDER_SITE_OTHER): Payer: Medicare Other | Admitting: Family Medicine

## 2020-05-22 ENCOUNTER — Ambulatory Visit: Payer: Self-pay

## 2020-05-22 ENCOUNTER — Encounter: Payer: Self-pay | Admitting: Family Medicine

## 2020-05-22 VITALS — BP 112/82 | HR 80 | Ht 66.0 in | Wt 234.4 lb

## 2020-05-22 DIAGNOSIS — M25552 Pain in left hip: Secondary | ICD-10-CM

## 2020-05-22 DIAGNOSIS — G8929 Other chronic pain: Secondary | ICD-10-CM

## 2020-05-22 DIAGNOSIS — M5417 Radiculopathy, lumbosacral region: Secondary | ICD-10-CM

## 2020-05-22 MED ORDER — HYDROCODONE-ACETAMINOPHEN 5-325 MG PO TABS: 1.0000 | ORAL_TABLET | Freq: Four times a day (QID) | ORAL | 0 refills | Status: DC | PRN

## 2020-05-22 NOTE — Patient Instructions (Addendum)
Thank you for coming in today.  Please call [facility] at [phone] to schedule your spine injection.   Recheck with me as needed.   Continue the lyrica as needed.  Stop taking it after the injection if you feel better.

## 2020-05-28 ENCOUNTER — Other Ambulatory Visit: Payer: Self-pay | Admitting: Obstetrics & Gynecology

## 2020-05-28 ENCOUNTER — Ambulatory Visit (INDEPENDENT_AMBULATORY_CARE_PROVIDER_SITE_OTHER): Payer: Medicare Other

## 2020-05-28 ENCOUNTER — Other Ambulatory Visit: Payer: Self-pay

## 2020-05-28 ENCOUNTER — Other Ambulatory Visit: Payer: Medicare Other

## 2020-05-28 DIAGNOSIS — Z13820 Encounter for screening for osteoporosis: Secondary | ICD-10-CM

## 2020-05-28 DIAGNOSIS — Z780 Asymptomatic menopausal state: Secondary | ICD-10-CM

## 2020-06-04 ENCOUNTER — Other Ambulatory Visit: Payer: Self-pay

## 2020-06-04 ENCOUNTER — Ambulatory Visit
Admission: RE | Admit: 2020-06-04 | Discharge: 2020-06-04 | Disposition: A | Discharge status: Home / Self Care | Payer: Medicare Other | Source: Ambulatory Visit | Attending: Family Medicine | Admitting: Family Medicine

## 2020-06-04 ENCOUNTER — Other Ambulatory Visit: Payer: Medicare Other

## 2020-06-04 DIAGNOSIS — M5417 Radiculopathy, lumbosacral region: Secondary | ICD-10-CM

## 2020-06-04 DIAGNOSIS — M47817 Spondylosis without myelopathy or radiculopathy, lumbosacral region: Secondary | ICD-10-CM | POA: Diagnosis not present

## 2020-06-04 MED ORDER — IOPAMIDOL (ISOVUE-M 200) INJECTION 41%
1.0000 mL | Freq: Once | INTRAMUSCULAR | Status: AC
  Administered 2020-06-04: 1 mL via EPIDURAL

## 2020-06-04 MED ORDER — METHYLPREDNISOLONE ACETATE 40 MG/ML INJ SUSP (RADIOLOG
120.0000 mg | Freq: Once | INTRAMUSCULAR | Status: AC
  Administered 2020-06-04: 120 mg via EPIDURAL

## 2020-06-04 NOTE — Discharge Instructions (Signed)

## 2020-06-18 ENCOUNTER — Other Ambulatory Visit: Payer: Self-pay | Admitting: Family

## 2020-06-18 DIAGNOSIS — E119 Type 2 diabetes mellitus without complications: Secondary | ICD-10-CM

## 2020-06-18 DIAGNOSIS — E559 Vitamin D deficiency, unspecified: Secondary | ICD-10-CM

## 2020-06-18 DIAGNOSIS — E785 Hyperlipidemia, unspecified: Secondary | ICD-10-CM

## 2020-06-18 DIAGNOSIS — M109 Gout, unspecified: Secondary | ICD-10-CM

## 2020-06-19 ENCOUNTER — Ambulatory Visit: Payer: Medicare Other | Admitting: Endocrinology

## 2020-07-05 NOTE — Progress Notes (Signed)
HPI F former smoker ( 20 pk yr, quit 1980) referred for shortness of breath by Dr Acharya/ Cardiology for eval of Dyspnea and wheezing, complicated by HTN, Hiatal Hernia, GERD/ stricture, Gatroparesis, Hx C.diff colitis, Goiter/ Hypothyroid p RAI, DM2, Eczema, Breast Cancer L/ chemo/ XRT, FE def Anemia, Renal Insuficiency,  ECHO- nl w mild AR,  Negative ischemia w/u w non-obstructive CAD on CCTA,  PFT 03/22/20- minimal obstruction possible, FEV1/FVC 0.80, insignificant response to bronchodilator, mild Diffusion deficit Overnight Oximetry 03/07/20- O2 sat was 5% or more below avg for at least 5 cumulative minutes. (7 min 44 sec), Qualifying for sleep O2 Group 1 alternate. ==========================================================   07/08/20- 55 yoF former smoker ( 20 pk yr, quit 1980) referred for shortness of breath by Dr Acharya/ Cardiology for eval of Dyspnea and wheezing, complicated by HTN, Hiatal Hernia, GERD/ stricture, Gastroparesis, Hx C.diff colitis, Goiter/ Hypothyroid p RAI, DM2, Eczema, Breast Cancer L/ chemo/ XRT, FE def Anemia, Renal Insuficiency, Aortic Atherosclerosis,  O2 2L sleep and portable/ [facility] ECHO- nl w mild AR,  PFT 03/22/20- minimal obstruction possible, FEV1/FVC 0.80, insignificant response to bronchodilator, mild Diffusion deficit Anemia chronic- Hgb 10.2.Nl indices May want to consider CPET Body weight today-226 lbs Covid vax-3 Phizer Flu vax-had Arrival O2 sat 99% on room air today. Using rescue inhaler 2-3x/ day.  Seemed a little vague and needed some prompting about meds, DME company. We discussed PFT showing only minimal obstruction. She notes occasional wheeze and continues Trelegy 200 at night, albuterol hfa 2-3x/ day with no major exacerbation. Mainly notes DOE walking maybe 50 feet, stairs, putting groceries away. Stable, little variation day to day and ok at rest.  Uses O2 at night but not her portable. No cough or phlegm. She is aware of chronic  anemia. Admits snoring, some dry mouth. Minimizes daytime sleepiness.  //consider HST//  ROS-see HPI   + = positive Constitutional:    weight loss, night sweats, fevers, chills, +fatigue, lassitude. HEENT:    headaches, difficulty swallowing, tooth/dental problems, sore throat,       sneezing, itching, ear ache, nasal congestion, post nasal drip, snoring CV:    chest pain, orthopnea, PND, +swelling in lower extremities, anasarca,                                   dizziness, palpitations Resp:  + shortness of breath with exertion or at rest.                productive cough,   non-productive cough, coughing up of blood.              change in color of mucus.  wheezing.   Skin:    rash or lesions. GI:  + heartburn, indigestion, abdominal pain, nausea, vomiting, diarrhea,                 change in bowel habits, loss of appetite GU: dysuria, change in color of urine, no urgency or frequency.   flank pain. MS:   joint pain, stiffness, decreased range of motion, back pain. Neuro-     nothing unusual Psych:  change in mood or affect.  depression or anxiety.   memory loss.  OBJ- Physical Exam General- Alert, Oriented, Affect-appropriate, Distress- none acute, + obese Skin- rash-none, lesions- none, excoriation- none Lymphadenopathy- none Head- atraumatic            Eyes- Gross vision intact, PERRLA, conjunctivae  and secretions clear            Ears- Hearing, canals-normal            Nose- Clear, no-Septal dev, mucus, polyps, erosion, perforation             Throat- Mallampati III-IV , mucosa clear , drainage- none, tonsils- atrophic, + teeth Neck- flexible , trachea midline, no stridor , thyroid nl, carotid no bruit Chest - symmetrical excursion , unlabored           Heart/CV- RRR , no murmur , no gallop  , no rub, nl s1 s2                           -JVD-none , edema- none, stasis changes- none, varices- none           Lung- clear to P&A, wheeze- none, cough- none , dullness-none, rub-  none           Chest wall-  Abd-  Br/ Gen/ Rectal- Not done, not indicated Extrem- cyanosis- none, clubbing, none, atrophy- none, strength- nl, + compression stockings Neuro- grossly intact to observation. +Question mild memory deficit.

## 2020-07-08 ENCOUNTER — Encounter: Payer: Self-pay | Admitting: Internal Medicine

## 2020-07-08 ENCOUNTER — Other Ambulatory Visit: Payer: Self-pay

## 2020-07-08 ENCOUNTER — Ambulatory Visit (INDEPENDENT_AMBULATORY_CARE_PROVIDER_SITE_OTHER): Payer: Medicare Other | Admitting: Internal Medicine

## 2020-07-08 DIAGNOSIS — G4734 Idiopathic sleep related nonobstructive alveolar hypoventilation: Secondary | ICD-10-CM | POA: Insufficient documentation

## 2020-07-08 DIAGNOSIS — R0609 Other forms of dyspnea: Secondary | ICD-10-CM

## 2020-07-08 DIAGNOSIS — R0600 Dyspnea, unspecified: Secondary | ICD-10-CM

## 2020-07-08 NOTE — Patient Instructions (Addendum)
I think you can continue your Trelegy inhaler and your albuterol rescue inhaler as you are doing.  Ok to continue oxygen 2L especially when you sleep, from [place].  Please call if we can help

## 2020-07-08 NOTE — Assessment & Plan Note (Signed)
Marginal need for sleep O2 but does seem to sleep better with it. Plan- consider sleep study. CPAP might remove need for O2, but unlikely to address her primary c/o of DOE.

## 2020-07-08 NOTE — Assessment & Plan Note (Signed)
Sleeps with O2. Doesn't use her portable and arrival sat on room air today 99%.  Says she notes occ wheeze, not evident on exam. Describes compliance with Trelegy 200 from [facility] [employer] and using rescue 2-3x/day. Not sure if it really bronchodilator benefit, or just reassuring to her. Most dyspnea likely reflects anemia and deconditioning.  Plan- continue inhalers for now. Consider cardiopulmonary exercise test.

## 2020-07-12 ENCOUNTER — Other Ambulatory Visit: Payer: Self-pay

## 2020-07-16 ENCOUNTER — Ambulatory Visit: Payer: Medicare Other | Admitting: Endocrinology

## 2020-08-06 ENCOUNTER — Telehealth: Payer: Self-pay | Admitting: Internal Medicine

## 2020-08-06 NOTE — Telephone Encounter (Signed)
*  STAT* If patient is at the pharmacy, call can be transferred to refill team.   1. Which medications need to be refilled? (please list name of each medication and dose if known) new prescription for Nitroglycerin  2. Which pharmacy/location (including street and city if local pharmacy) is medication to be sent to? [facility] [address] [place]  3. Do they need a 30 day or 90 day supply?

## 2020-08-07 ENCOUNTER — Other Ambulatory Visit: Payer: Self-pay

## 2020-08-07 MED ORDER — NITROGLYCERIN 0.4 MG SL SUBL: 0.4000 mg | SUBLINGUAL_TABLET | SUBLINGUAL | 6 refills | Status: DC | PRN

## 2020-08-19 ENCOUNTER — Ambulatory Visit (HOSPITAL_COMMUNITY): Payer: Medicare Other | Attending: Cardiovascular Disease

## 2020-08-19 ENCOUNTER — Other Ambulatory Visit: Payer: Self-pay

## 2020-08-19 DIAGNOSIS — Q2549 Other congenital malformations of aorta: Secondary | ICD-10-CM | POA: Diagnosis not present

## 2020-08-19 DIAGNOSIS — R600 Localized edema: Secondary | ICD-10-CM | POA: Diagnosis not present

## 2020-08-19 LAB — ECHOCARDIOGRAM COMPLETE
Area-P 1/2: 3.12 cm2
P 1/2 time: 529 msec
S' Lateral: 2.8 cm

## 2020-08-20 ENCOUNTER — Ambulatory Visit (INDEPENDENT_AMBULATORY_CARE_PROVIDER_SITE_OTHER): Payer: Medicare Other | Admitting: Endocrinology

## 2020-08-20 VITALS — BP 128/60 | HR 80 | Ht 65.0 in | Wt 225.2 lb

## 2020-08-20 DIAGNOSIS — E119 Type 2 diabetes mellitus without complications: Secondary | ICD-10-CM | POA: Diagnosis not present

## 2020-08-20 DIAGNOSIS — E042 Nontoxic multinodular goiter: Secondary | ICD-10-CM

## 2020-08-20 LAB — POCT GLYCOSYLATED HEMOGLOBIN (HGB A1C): Hemoglobin A1C: 5.6 % (ref 4.0–5.6)

## 2020-08-20 LAB — TSH: TSH: 4.49 u[IU]/mL (ref 0.35–4.50)

## 2020-08-20 LAB — T4, FREE: Free T4: 0.91 ng/dL (ref 0.60–1.60)

## 2020-08-20 NOTE — Progress Notes (Signed)
Subjective:    Patient ID: [name], female    DOB: 1946-02-18, 75 y.o.   MRN: [id number]  HPI Pt returns for f/u of diabetes mellitus: DM type: 2 Dx'ed: 2951 Complications: CRI Therapy: no medication recently GDM: never DKA: never Severe hypoglycemia: never Pancreatitis: never Pancreatic imaging: normal on 2020 CT.  SDOH: none Other: she has never been on insulin. Interval history: none Pt also has MNG (dx'ed 2004: when she developed hyperthyroidism in 2010, she had RAI rx; she has been on synthroid since then; f/u US in 2021 was unchanged; in 2021, pt requested bx of the left lobe nodule--beth cat 1).  She takes synthroid as rx'ed.  Main symptom is solid dysphagia at the ant neck.  Past Medical History:  Diagnosis Date  . Allergy   . ANEMIA, IRON DEFICIENCY 05/08/2009  . Angina   . ASYMPTOMATIC POSTMENOPAUSAL STATUS 10/11/2008  . Blood transfusion   . Blood transfusion without reported diagnosis   . Breast cancer ([employer]) 09/29/11   invasive grade III ductal ca,assoc high grade dcis,ER/PR=neg  . C. difficile colitis   . Cataract   . Diverticulosis of colon (without mention of hemorrhage)   . Esophageal reflux 06/12/2008  . Gastroparesis   . GOITER, MULTINODULAR 04/02/2009  . Gout, unspecified   . H/O hiatal hernia   . History of lower GI bleeding   . History of radiation therapy 02/08/12-03/25/12   left breast,total 61gy  . Hypokalemia 05/11/2013  . Hypomagnesemia   . HYPOTHYROIDISM, POST-RADIATION 08/13/2009  . Internal hemorrhoids without mention of complication   . Kidney stones    "several"  . Leukopenia   . Migraines   . Obesity   . Osteoarthrosis, unspecified whether generalized or localized, unspecified site   . Other and unspecified hyperlipidemia   . Personal history of chemotherapy 2013  . Personal history of radiation therapy 2013   left  . PONV (postoperative nausea and vomiting)   . PUD (peptic ulcer disease)   . Short bowel syndrome   .  Shortness of breath on exertion    "sometimes"  . Stricture and stenosis of esophagus   . Thyrotoxicosis without mention of goiter or other cause, without mention of thyrotoxic crisis or storm   . Type II or unspecified type diabetes mellitus without mention of complication, not stated as uncontrolled    no med in years diet controled  . Unspecified essential hypertension   . UTI (urinary tract infection)   . Varicose veins   . VITAMIN B12 DEFICIENCY 08/30/2009    Past Surgical History:  Procedure Laterality Date  . ABDOMINAL ADHESION SURGERY  1980's thru 1990's   "several"  . ABDOMINAL HYSTERECTOMY  1970's   with BSO  . BREAST BIOPSY Left 08/13/11   left breast lower inner quadrant  . BREAST BIOPSY Right 1985   Rt exc bx, benign  . BREAST LUMPECTOMY Left 08/2011  . BREAST LUMPECTOMY W/ NEEDLE LOCALIZATION  09/29/11   left  breast=lymph node,excision benign/ ER/PR=neg, her 2 Positive  . BUNIONECTOMY  1970's   bilateral  . CHOLECYSTECTOMY  1990's  . COLON SURGERY     "several surgeries for short bowel syndrome"  . COLONOSCOPY  2012   multiple   . DILATION AND CURETTAGE OF UTERUS    . ESOPHAGOGASTRODUODENOSCOPY  2011   multiple   . EXCISIONAL HEMORRHOIDECTOMY  11/10/2016  . EYE SURGERY     "long time ago"  . FLEXIBLE SIGMOIDOSCOPY  2011   multiple   .  KIDNEY STONE SURGERY  1990's   "tried to go up & get it but pushed it further up"  . LITHOTRIPSY     "4 or 5 times"  . MASTECTOMY W/ NODES PARTIAL  09/29/11   left  . PORT-A-CATH REMOVAL Right 12/19/2013   Procedure: MINOR REMOVAL PORT-A-CATH;  Surgeon: Adin Hector, MD;  Location: [place];  Service: General;  Laterality: Right;  . PORTACATH PLACEMENT  09/29/2011   Procedure: INSERTION PORT-A-CATH;  Surgeon: Adin Hector, MD;  Location: [place];  Service: General;  Laterality: N/A;  . SMALL INTESTINE SURGERY    . Thyroid Ultrasound  12/1994 and 12/1995  . TOTAL KNEE ARTHROPLASTY Right 06/05/2015    Procedure: RIGHT TOTAL KNEE ARTHROPLASTY;  Surgeon: Ninetta Lights, MD;  Location: [place];  Service: Orthopedics;  Laterality: Right;  . UPPER GASTROINTESTINAL ENDOSCOPY    . VEIN LIGATION AND STRIPPING  1980's   Right leg    Social History   Socioeconomic History  . Marital status: Widowed    Spouse name: Not on file  . Number of children: 2  . Years of education: Not on file  . Highest education level: Not on file  Occupational History  . Occupation: RETIRED  Tobacco Use  . Smoking status: Former Smoker    Packs/day: 1.00    Years: 10.00    Pack years: 10.00    Types: Cigarettes    Quit date: 09/22/1985    Years since quitting: 34.9  . Smokeless tobacco: Never Used  Vaping Use  . Vaping Use: Never used  Substance and Sexual Activity  . Alcohol use: No    Comment: 09/29/11 "used to drink socially years ago"  . Drug use: No  . Sexual activity: Yes    Comment: 1st intercourse- 15, partners- 62, boyfriend- 1.8 yrs  Other Topics Concern  . Not on file  Social History Narrative   Pt gets regular exercise   Social Determinants of Health   Financial Resource Strain: Not on file  Food Insecurity: Not on file  Transportation Needs: Not on file  Physical Activity: Not on file  Stress: Not on file  Social Connections: Not on file  Intimate Partner Violence: Not on file    Current Outpatient Medications on File Prior to Visit  Medication Sig Dispense Refill  . albuterol (VENTOLIN HFA) 108 (90 Base) MCG/ACT inhaler Inhale 2 puffs into the lungs every 6 (six) hours as needed for wheezing or shortness of breath. 54 g 4  . allopurinol (ZYLOPRIM) 100 MG tablet TAKE ONE TABLET BY MOUTH EVERY DAY 90 tablet 3  . amitriptyline (ELAVIL) 50 MG tablet TAKE ONE TABLET BY MOUTH EVERY DAY AT BEDTIME- (USE CAUTION - MAY CAUSE DROWSINESS) 90 tablet 0  . calcium carbonate (TUMS - DOSED IN MG ELEMENTAL CALCIUM) 500 MG chewable tablet Chew 1 tablet by mouth as needed for indigestion or heartburn.     . Calcium Carbonate-Vitamin D (CALCIUM-VITAMIN D) 600-200 MG-UNIT CAPS Take 1 capsule by mouth daily.    . Cyanocobalamin (VITAMIN B 12 PO) Take 1 tablet by mouth 2 (two) times daily.     Marland Kitchen dicyclomine (BENTYL) 20 MG tablet Take 1 tablet (20 mg total) by mouth 3 (three) times daily as needed for spasms. 360 tablet 3  . esomeprazole (NEXIUM) 40 MG capsule Take 1 capsule (40 mg total) by mouth 2 (two) times daily before a meal. 180 capsule 3  . ferrous sulfate 325 (65 FE) MG tablet Take  1 tablet (325 mg total) by mouth daily with breakfast.    . fluticasone (FLONASE) 50 MCG/ACT nasal spray Place 2 sprays into both nostrils daily. 16 g 6  . Fluticasone-Umeclidin-Vilant (TRELEGY ELLIPTA) 200-62.5-25 MCG/INH AEPB Inhale daily as directed 3 each 1  . furosemide (LASIX) 40 MG tablet Take 1 tablet (40 mg total) by mouth daily. 90 tablet 3  . HYDROcodone-acetaminophen (NORCO/VICODIN) 5-325 MG tablet Take 1 tablet by mouth every 6 (six) hours as needed. 15 tablet 0  . levothyroxine (SYNTHROID) 175 MCG tablet Take 1 tablet (175 mcg total) by mouth daily before breakfast. 90 tablet 3  . loratadine (CLARITIN) 10 MG tablet Take 1 tablet (10 mg total) by mouth daily. 90 tablet 3  . lovastatin (MEVACOR) 20 MG tablet TAKE ONE TABLET BY MOUTH EVERY DAY AT 6:00PM 90 tablet 2  . Magnesium 500 MG CAPS Take 1 mg by mouth 2 (two) times daily.     . nitroGLYCERIN (NITROSTAT) 0.4 MG SL tablet Place 1 tablet (0.4 mg total) under the tongue every 5 (five) minutes as needed. 25 tablet 6  . pantoprazole (PROTONIX) 40 MG tablet TAKE 1 TABLET BY MOUTH 30 MINUTES PRIOR TO BREAKFAST AND SUPPER 180 tablet 1  . pregabalin (LYRICA) 75 MG capsule Take 1 capsule (75 mg total) by mouth 2 (two) times daily. For nerve pain 180 capsule 3  . Thiamine HCl (THIAMINE PO) Take 1 tablet by mouth daily.      Current Facility-Administered Medications on File Prior to Visit  Medication Dose Route Frequency Provider Last Rate Last Admin  .  acetaminophen (TYLENOL) tablet 1,000 mg  1,000 mg Oral Once Amada Kingfisher, MD        Allergies  Allergen Reactions  . Aspirin Other (See Comments)    REACTION: Gi Intolerance/ Burning in stomach  . Trazodone And Nefazodone Nausea And Vomiting    "sick"  . Flagyl [Metronidazole] Rash  . Morphine And Related Itching    Family History  Problem Relation Age of Onset  . Esophageal cancer Son        deceased  . Breast cancer Son        esophageal  . Diabetes Mother   . Heart disease Mother   . Kidney disease Sister   . Diabetes Father   . Hypertension Father   . Kidney disease Brother        x 3  . Colon cancer Paternal Uncle   . Breast cancer Maternal Aunt   . Breast cancer Paternal Aunt   . Breast cancer Cousin        Pt states she has 15+ cousins w/ Breast CA  . Rectal cancer Neg Hx   . Stomach cancer Neg Hx     BP 128/60 (BP Location: Right Arm, Patient Position: Sitting, Cuff Size: Large)   Pulse 80   Ht 5\' 5"  (1.651 m)   Wt 225 lb 3.2 oz (102.2 kg)   SpO2 98%   BMI 37.48 kg/m    Review of Systems     Objective:   Physical Exam VITAL SIGNS:  See vs page GENERAL: no distress NECK: thyroid is slightly enlarged, but I cant tell details.     Lab Results  Component Value Date   HGBA1C 5.6 08/20/2020   Lab Results  Component Value Date   CREATININE 1.03 (H) 02/12/2020   BUN 18 02/12/2020   NA 142 02/12/2020   K 4.1 02/12/2020   CL 106 02/12/2020   CO2 29  02/12/2020   Lab Results  Component Value Date   TSH 4.49 08/20/2020   T4TOTAL 8.1 07/29/2007       Assessment & Plan:  Type 2 DM, with CRI: stable off rx Hypothyroidism: well-replaced.  Please continue the same synthroid MNG, due to receheck Dysphagia, new.  uncertain etiology and prognosis  Patient Instructions  Blood tests are requested for you today.  We'll let you know about the results.  No medication is needed for the diabetes now Let's recheck the ultrasound.  you will receive a phone  call, about a day and time for an appointment. Please let me know if you want to do a swallowing test, for your symptoms.   Please come back for a follow-up appointment in 6 months.

## 2020-08-20 NOTE — Patient Instructions (Addendum)
Blood tests are requested for you today.  We'll let you know about the results.  No medication is needed for the diabetes now Let's recheck the ultrasound.  you will receive a phone call, about a day and time for an appointment. Please let me know if you want to do a swallowing test, for your symptoms.   Please come back for a follow-up appointment in 6 months.

## 2020-08-25 NOTE — Progress Notes (Signed)
Cardiology Office Note:    Date:  08/28/2020   ID:  REMMINGTON TETERS, DOB 09/13/1945, MRN [id number]  PCP:  Marrian Salvage, [place]  Cardiologist:  Elouise Munroe, MD  Electrophysiologist:  None   Referring MD: Marrian Salvage,*   Chief Complaint/Reason for Referral: SoV aneurysm, DOE  History of Present Illness:    LORIEN SHINGLER is a 75 y.o. female with a history of left breast cancer with chemotherapy and radiation, GERD, hypothyroidism, mild CAD by CCTA, and diabetes mellitus type 2 diet controlled. 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  CT angiography for coronary arteries was performed February 2020, I was able to measure her sinuses at that time.  They were 48 x 48 x 49 mm.  This suggests stability of this sinus of Valsalva dilation.   Echo 08/19/20 - no significant change in sinus dimensions.  1. Left ventricular ejection fraction, by estimation, is 60 to 65%. The  left ventricle has normal function. The left ventricle has no regional  wall motion abnormalities. There is mild left ventricular hypertrophy.  Left ventricular diastolic parameters  were normal.   2. Right ventricular systolic function is normal. The right ventricular  size is normal.   3. The mitral valve is normal in structure. No evidence of mitral valve  regurgitation. No evidence of mitral stenosis.   4. The aortic valve is normal in structure. Aortic valve regurgitation is  mild. No aortic stenosis is present.   5. See above regarding sinus dilatation Ascending aortic root also  dilated at 4.1 cm Dilatation is largest in the non coronary sinus . Aortic dilatation noted. There is severe dilatation of the aortic root, measuring 49 mm.   6. The inferior vena cava is normal in size with greater than 50%  respiratory variability, suggesting right atrial pressure of 3 mmHg.   She feels well today. However, she had an episode of severe chest pain on March 8 and called into the  office for her nitro prescription renewal.  During her episode of chest pain after about 20 minutes she took a nitroglycerin and after 2 nitroglycerin her chest pain was relieved.  I stressed to her that if another episode like this occurs, she is to present to the emergency department.  She has mild nonobstructive CAD on a recent coronary CT, but has a history of DVT many years ago for which she is not currently anticoagulated and also has a sinus of Valsalva aneurysm as noted above.  We had a surveillance echocardiogram performed for her sinus of Valsalva aneurysm that occurred after her episode of chest pain.  Independently reviewed the images from the current echocardiogram as well as the most recent echocardiogram from 2021.  Wall motion and LVEF are grossly unchanged, I do not see focal regional wall motion abnormalities on her current echo to suggest infarct associated with episode of chest pain.  In addition her ascending aorta appears similar to prior, and mild aortic valve regurgitation appears stable.  No obvious signs on echocardiogram of ascending aortic dissection.  We discussed that if she has recurrent chest pain, ascending aortic dissection is imminently life-threatening, though subacute course is occasionally seen.  For this reason I have reviewed with her that a CT angiogram aorta is indicated at this time for severe chest pain.  Past Medical History:  Diagnosis Date  . Allergy   . ANEMIA, IRON DEFICIENCY 05/08/2009  . Angina   . ASYMPTOMATIC POSTMENOPAUSAL STATUS 10/11/2008  .  Blood transfusion   . Blood transfusion without reported diagnosis   . Breast cancer ([place]) 09/29/11   invasive grade III ductal ca,assoc high grade dcis,ER/PR=neg  . C. difficile colitis   . Cataract   . Diverticulosis of colon (without mention of hemorrhage)   . Esophageal reflux 06/12/2008  . Gastroparesis   . GOITER, MULTINODULAR 04/02/2009  . Gout, unspecified   . H/O hiatal hernia   . History of lower  GI bleeding   . History of radiation therapy 02/08/12-03/25/12   left breast,total 61gy  . Hypokalemia 05/11/2013  . Hypomagnesemia   . HYPOTHYROIDISM, POST-RADIATION 08/13/2009  . Internal hemorrhoids without mention of complication   . Kidney stones    "several"  . Leukopenia   . Migraines   . Obesity   . Osteoarthrosis, unspecified whether generalized or localized, unspecified site   . Other and unspecified hyperlipidemia   . Personal history of chemotherapy 2013  . Personal history of radiation therapy 2013   left  . PONV (postoperative nausea and vomiting)   . PUD (peptic ulcer disease)   . Short bowel syndrome   . Shortness of breath on exertion    "sometimes"  . Stricture and stenosis of esophagus   . Thyrotoxicosis without mention of goiter or other cause, without mention of thyrotoxic crisis or storm   . Type II or unspecified type diabetes mellitus without mention of complication, not stated as uncontrolled    no med in years diet controled  . Unspecified essential hypertension   . UTI (urinary tract infection)   . Varicose veins   . VITAMIN B12 DEFICIENCY 08/30/2009    Past Surgical History:  Procedure Laterality Date  . ABDOMINAL ADHESION SURGERY  1980's thru 1990's   "several"  . ABDOMINAL HYSTERECTOMY  1970's   with BSO  . BREAST BIOPSY Left 08/13/11   left breast lower inner quadrant  . BREAST BIOPSY Right 1985   Rt exc bx, benign  . BREAST LUMPECTOMY Left 08/2011  . BREAST LUMPECTOMY W/ NEEDLE LOCALIZATION  09/29/11   left  breast=lymph node,excision benign/ ER/PR=neg, her 2 Positive  . BUNIONECTOMY  1970's   bilateral  . CHOLECYSTECTOMY  1990's  . COLON SURGERY     "several surgeries for short bowel syndrome"  . COLONOSCOPY  2012   multiple   . DILATION AND CURETTAGE OF UTERUS    . ESOPHAGOGASTRODUODENOSCOPY  2011   multiple   . EXCISIONAL HEMORRHOIDECTOMY  11/10/2016  . EYE SURGERY     "long time ago"  . FLEXIBLE SIGMOIDOSCOPY  2011   multiple    . KIDNEY STONE SURGERY  1990's   "tried to go up & get it but pushed it further up"  . LITHOTRIPSY     "4 or 5 times"  . MASTECTOMY W/ NODES PARTIAL  09/29/11   left  . PORT-A-CATH REMOVAL Right 12/19/2013   Procedure: MINOR REMOVAL PORT-A-CATH;  Surgeon: Adin Hector, MD;  Location: [facility];  Service: General;  Laterality: Right;  . PORTACATH PLACEMENT  09/29/2011   Procedure: INSERTION PORT-A-CATH;  Surgeon: Adin Hector, MD;  Location: [facility];  Service: General;  Laterality: N/A;  . SMALL INTESTINE SURGERY    . Thyroid Ultrasound  12/1994 and 12/1995  . TOTAL KNEE ARTHROPLASTY Right 06/05/2015   Procedure: RIGHT TOTAL KNEE ARTHROPLASTY;  Surgeon: Ninetta Lights, MD;  Location: [facility];  Service: Orthopedics;  Laterality: Right;  . UPPER GASTROINTESTINAL ENDOSCOPY    . VEIN LIGATION  AND STRIPPING  1980's   Right leg    Current Medications: Current Meds  Medication Sig  . albuterol (VENTOLIN HFA) 108 (90 Base) MCG/ACT inhaler Inhale 2 puffs into the lungs every 6 (six) hours as needed for wheezing or shortness of breath.  . allopurinol (ZYLOPRIM) 100 MG tablet TAKE ONE TABLET BY MOUTH EVERY DAY  . amitriptyline (ELAVIL) 50 MG tablet TAKE ONE TABLET BY MOUTH EVERY DAY AT BEDTIME- (USE CAUTION - MAY CAUSE DROWSINESS)  . calcium carbonate (TUMS - DOSED IN MG ELEMENTAL CALCIUM) 500 MG chewable tablet Chew 1 tablet by mouth as needed for indigestion or heartburn.  . Calcium Carbonate-Vitamin D (CALCIUM-VITAMIN D) 600-200 MG-UNIT CAPS Take 1 capsule by mouth daily.  . Cyanocobalamin (VITAMIN B 12 PO) Take 1 tablet by mouth 2 (two) times daily.   Marland Kitchen dicyclomine (BENTYL) 20 MG tablet Take 1 tablet (20 mg total) by mouth 3 (three) times daily as needed for spasms.  Marland Kitchen esomeprazole (NEXIUM) 40 MG capsule Take 1 capsule (40 mg total) by mouth 2 (two) times daily before a meal.  . ferrous sulfate 325 (65 FE) MG tablet Take 1 tablet (325 mg total) by mouth daily with breakfast.  .  fluticasone (FLONASE) 50 MCG/ACT nasal spray Place 2 sprays into both nostrils daily.  . Fluticasone-Umeclidin-Vilant (TRELEGY ELLIPTA) 200-62.5-25 MCG/INH AEPB Inhale daily as directed  . furosemide (LASIX) 40 MG tablet Take 1 tablet (40 mg total) by mouth daily.  Marland Kitchen HYDROcodone-acetaminophen (NORCO/VICODIN) 5-325 MG tablet Take 1 tablet by mouth every 6 (six) hours as needed.  Marland Kitchen levothyroxine (SYNTHROID) 175 MCG tablet Take 1 tablet (175 mcg total) by mouth daily before breakfast.  . loratadine (CLARITIN) 10 MG tablet Take 1 tablet (10 mg total) by mouth daily.  Marland Kitchen lovastatin (MEVACOR) 20 MG tablet TAKE ONE TABLET BY MOUTH EVERY DAY AT 6:00PM  . Magnesium 500 MG CAPS Take 1 mg by mouth 2 (two) times daily.   . nitroGLYCERIN (NITROSTAT) 0.4 MG SL tablet Place 1 tablet (0.4 mg total) under the tongue every 5 (five) minutes as needed.  . pantoprazole (PROTONIX) 40 MG tablet TAKE 1 TABLET BY MOUTH 30 MINUTES PRIOR TO BREAKFAST AND SUPPER  . pregabalin (LYRICA) 75 MG capsule Take 1 capsule (75 mg total) by mouth 2 (two) times daily. For nerve pain  . Thiamine HCl (THIAMINE PO) Take 1 tablet by mouth daily.      Allergies:   Aspirin, Trazodone and nefazodone, Flagyl [metronidazole], and Morphine and related   Social History   Tobacco Use  . Smoking status: Former Smoker    Packs/day: 1.00    Years: 10.00    Pack years: 10.00    Types: Cigarettes    Quit date: 09/22/1985    Years since quitting: 34.9  . Smokeless tobacco: Never Used  Vaping Use  . Vaping Use: Never used  Substance Use Topics  . Alcohol use: No    Comment: 09/29/11 "used to drink socially years ago"  . Drug use: No     Family History: The patient's family history includes Breast cancer in her cousin, maternal aunt, paternal aunt, and son; Colon cancer in her paternal uncle; Diabetes in her father and mother; Esophageal cancer in her son; Heart disease in her mother; Hypertension in her father; Kidney disease in her brother  and sister. There is no history of Rectal cancer or Stomach cancer.  ROS:   Please see the history of present illness.    All other systems reviewed  and are negative.  EKGs/Labs/Other Studies Reviewed:    The following studies were reviewed today:  EKG: Normal sinus rhythm, low voltage QRS, poor R wave progression.  Recent Labs: 02/12/2020: ALT 18; Brain Natriuretic Peptide 64; BUN 18; Creat 1.03; Hemoglobin 10.2; Platelets 287; Potassium 4.1; Sodium 142 08/20/2020: TSH 4.49  Recent Lipid Panel    Component Value Date/Time   CHOL 150 03/17/2018 1200   TRIG 112.0 03/17/2018 1200   HDL 75.50 03/17/2018 1200   CHOLHDL 2 03/17/2018 1200   VLDL 22.4 03/17/2018 1200   LDLCALC 52 03/17/2018 1200   LDLDIRECT 53.0 10/29/2015 1338    Physical Exam:    VS:  BP 118/84   Pulse 73   Ht 5\' 5"  (1.651 m)   Wt 225 lb (102.1 kg)   SpO2 98%   BMI 37.44 kg/m     Wt Readings from Last 5 Encounters:  08/28/20 225 lb (102.1 kg)  08/20/20 225 lb 3.2 oz (102.2 kg)  07/08/20 226 lb 12.8 oz (102.9 kg)  05/22/20 234 lb 6.4 oz (106.3 kg)  03/25/20 226 lb (102.5 kg)    Constitutional: No acute distress Eyes: sclera non-icteric, normal conjunctiva and lids ENMT: normal dentition, moist mucous membranes Cardiovascular: regular rhythm, normal rate, no murmurs. S1 and S2 normal. Radial pulses normal bilaterally. No jugular venous distention.  Respiratory: clear to auscultation bilaterally GI : normal bowel sounds, soft and nontender. No distention.   MSK: extremities warm, well perfused. No edema.  NEURO: grossly nonfocal exam, moves all extremities. PSYCH: alert and oriented x 3, normal mood and affect.   ASSESSMENT:    1. Dilatation of aortic sinus of Valsalva   2. Dyspnea on exertion   3. Bilateral leg edema   4. Snoring   5. Precordial pain   6. Ascending aortic aneurysm (HCC)    PLAN:    Chest pain Mild CAD Dyspnea on exertion Ascending aortic aneurysm History of DVT with right  leg swelling -I am concerned about the patient's episode of nitro responsive chest pain which was severe.  She has mild CAD and no new wall motion abnormalities on recent echocardiogram that occurred after her episode of chest pain.  Suspicion is less for ACS though not entirely excluded, and we should consider ischemic testing in the very near future.  Coronary CTA was performed in 2020. -With severe episode of chest pain, would want to further evaluate the ascending aorta, will perform CTA aorta.  It may be difficult to make measurements of the sinuses of Valsalva, though they appear stable on recent echocardiogram. -She also has a history of DVT and I am somewhat concerned about PE.  We will perform lower extremity Dopplers today and obtain a D-dimer.  If these are concerning, will proceed with CT PE study.  Follow-up in approximately 2 weeks to reevaluate chest pain and follow-up testing.   Total time of encounter: 45 minutes total time of encounter, including 25 minutes spent in face-to-face patient care on the date of this encounter. This time includes coordination of care and counseling regarding above mentioned problem list. Remainder of non-face-to-face time involved reviewing chart documents/testing relevant to the patient encounter and documentation in the medical record. I have independently reviewed documentation from referring provider.   Cherlynn Kaiser, MD, [facility]    Medication Adjustments/Labs and Tests Ordered: Current medicines are reviewed at length with the patient today.  Concerns regarding medicines are outlined above.   Orders Placed This Encounter  Procedures  . CT ANGIO CHEST AORTA W/CM & OR WO/CM  . D-dimer, quantitative  . Basic metabolic panel  . Brain natriuretic peptide  . EKG 12-Lead  . VAS [place] LOWER EXTREMITY VENOUS (DVT)   No orders of the defined types were placed in this encounter.   Patient Instructions  Medication  Instructions:  No Changes In Medications at this time. *If you need a refill on your cardiac medications before your next appointment, please call your pharmacy*  Lab Work: BMP, BNP, D-DIMER-TODAY If you have labs (blood work) drawn today and your tests are completely normal, you will receive your results only by: Marland Kitchen MyChart Message (if you have MyChart) OR . A paper copy in the mail If you have any lab test that is abnormal or we need to change your treatment, we will call you to review the results.  Testing/Procedures: CTA CHEST/AORTA-PLEASE SCHEDULE THIS AT NEXT AVAILABLE- IF POSSIBLE TOMORROW  Your physician has requested that you have a lower extremity venous duplex. This test is an ultrasound of the veins in the legs or arms. It looks at venous blood flow that carries blood from the heart to the legs or arms. Allow one hour for a Lower Venous exam. Allow thirty minutes for an Upper Venous exam. There are no restrictions or special instructions.  Follow-Up: At [facility], you and your health needs are our priority.  As part of our continuing mission to provide you with exceptional heart care, we have created designated Provider Care Teams.  These Care Teams include your primary Cardiologist (physician) and Advanced Practice Providers (APPs -  Physician Assistants and Nurse Practitioners) who all work together to provide you with the care you need, when you need it.  We recommend signing up for the patient portal called "MyChart".  Sign up information is provided on this After Visit Summary.  MyChart is used to connect with patients for Virtual Visits (Telemedicine).  Patients are able to view lab/test results, encounter notes, upcoming appointments, etc.  Non-urgent messages can be sent to your provider as well.   To learn more about what you can do with MyChart, go to [email or web address].    Your next appointment:   1-2 WEEKS The format for your next appointment:   In  Person  Provider:   Cherlynn Kaiser, MD  Other Instructions PLEASE USE NITROGLYCERIN FOR CHEST PAIN, IF NO RELIEF AFTER 1 st DOSE AND CHEST PAIN PERSISTS TAKE 2nd DOSE OF NITRO AND REPORT TO THE ED/CALL 911.

## 2020-08-28 ENCOUNTER — Telehealth: Payer: Self-pay | Admitting: Internal Medicine

## 2020-08-28 ENCOUNTER — Ambulatory Visit (HOSPITAL_COMMUNITY)
Admission: RE | Admit: 2020-08-28 | Discharge: 2020-08-28 | Disposition: A | Discharge status: Home / Self Care | Payer: Medicare Other | Source: Ambulatory Visit | Attending: Cardiovascular Disease | Admitting: Cardiovascular Disease

## 2020-08-28 ENCOUNTER — Ambulatory Visit (INDEPENDENT_AMBULATORY_CARE_PROVIDER_SITE_OTHER): Payer: Medicare Other | Admitting: Internal Medicine

## 2020-08-28 ENCOUNTER — Other Ambulatory Visit: Payer: Self-pay

## 2020-08-28 ENCOUNTER — Ambulatory Visit
Admission: RE | Admit: 2020-08-28 | Discharge: 2020-08-28 | Disposition: A | Discharge status: Home / Self Care | Payer: Medicare Other | Source: Ambulatory Visit | Attending: Internal Medicine | Admitting: Internal Medicine

## 2020-08-28 ENCOUNTER — Encounter: Payer: Self-pay | Admitting: Internal Medicine

## 2020-08-28 VITALS — BP 118/84 | HR 73 | Ht 65.0 in | Wt 225.0 lb

## 2020-08-28 DIAGNOSIS — R072 Precordial pain: Secondary | ICD-10-CM

## 2020-08-28 DIAGNOSIS — R600 Localized edema: Secondary | ICD-10-CM

## 2020-08-28 DIAGNOSIS — I7121 Aneurysm of the ascending aorta, without rupture: Secondary | ICD-10-CM

## 2020-08-28 DIAGNOSIS — I711 Thoracic aortic aneurysm, ruptured: Secondary | ICD-10-CM | POA: Diagnosis not present

## 2020-08-28 DIAGNOSIS — Q2549 Other congenital malformations of aorta: Secondary | ICD-10-CM

## 2020-08-28 DIAGNOSIS — R0683 Snoring: Secondary | ICD-10-CM | POA: Diagnosis not present

## 2020-08-28 DIAGNOSIS — I712 Thoracic aortic aneurysm, without rupture: Secondary | ICD-10-CM

## 2020-08-28 DIAGNOSIS — R0600 Dyspnea, unspecified: Secondary | ICD-10-CM | POA: Diagnosis not present

## 2020-08-28 DIAGNOSIS — R0609 Other forms of dyspnea: Secondary | ICD-10-CM

## 2020-08-28 MED ORDER — IOPAMIDOL (ISOVUE-370) INJECTION 76%
75.0000 mL | Freq: Once | INTRAVENOUS | Status: AC | PRN
  Administered 2020-08-28: 75 mL via INTRAVENOUS

## 2020-08-28 NOTE — Telephone Encounter (Signed)
Spoke with patient regarding the CTA chest aorta ordered by Dr. Jodi Mourning to arrive at [facility] before 3:00pm today (08/27/20) for her appointment (study is being handled as a walk in).  Patient voiced her understanding.

## 2020-08-28 NOTE — Patient Instructions (Addendum)
Medication Instructions:  No Changes In Medications at this time. *If you need a refill on your cardiac medications before your next appointment, please call your pharmacy*  Lab Work: BMP, BNP, D-DIMER-TODAY If you have labs (blood work) drawn today and your tests are completely normal, you will receive your results only by: Marland Kitchen MyChart Message (if you have MyChart) OR . A paper copy in the mail If you have any lab test that is abnormal or we need to change your treatment, we will call you to review the results.  Testing/Procedures: CTA CHEST/AORTA-PLEASE SCHEDULE THIS AT NEXT AVAILABLE- IF POSSIBLE TOMORROW  Your physician has requested that you have a lower extremity venous duplex. This test is an ultrasound of the veins in the legs or arms. It looks at venous blood flow that carries blood from the heart to the legs or arms. Allow one hour for a Lower Venous exam. Allow thirty minutes for an Upper Venous exam. There are no restrictions or special instructions.  Follow-Up: At [facility], you and your health needs are our priority.  As part of our continuing mission to provide you with exceptional heart care, we have created designated Provider Care Teams.  These Care Teams include your primary Cardiologist (physician) and Advanced Practice Providers (APPs -  Physician Assistants and Nurse Practitioners) who all work together to provide you with the care you need, when you need it.  We recommend signing up for the patient portal called "MyChart".  Sign up information is provided on this After Visit Summary.  MyChart is used to connect with patients for Virtual Visits (Telemedicine).  Patients are able to view lab/test results, encounter notes, upcoming appointments, etc.  Non-urgent messages can be sent to your provider as well.   To learn more about what you can do with MyChart, go to [email or web address].    Your next appointment:   1-2 WEEKS The format for your next appointment:   In  Person  Provider:   Cherlynn Kaiser, MD  Other Instructions PLEASE USE NITROGLYCERIN FOR CHEST PAIN, IF NO RELIEF AFTER 1 st DOSE AND CHEST PAIN PERSISTS TAKE 2nd DOSE OF NITRO AND REPORT TO THE ED/CALL 911.

## 2020-08-29 LAB — BASIC METABOLIC PANEL
BUN/Creatinine Ratio: 19 (ref 12–28)
BUN: 17 mg/dL (ref 8–27)
CO2: 24 mmol/L (ref 20–29)
Calcium: 9.2 mg/dL (ref 8.7–10.3)
Chloride: 102 mmol/L (ref 96–106)
Creatinine, Ser: 0.9 mg/dL (ref 0.57–1.00)
Glucose: 80 mg/dL (ref 65–99)
Potassium: 3.8 mmol/L (ref 3.5–5.2)
Sodium: 144 mmol/L (ref 134–144)
eGFR: 66 mL/min/{1.73_m2} (ref >59)

## 2020-08-29 LAB — D-DIMER, QUANTITATIVE: D-DIMER: 1.09 mg/L FEU — ABNORMAL HIGH (ref 0.00–0.49)

## 2020-08-29 LAB — BRAIN NATRIURETIC PEPTIDE: BNP: 35.7 pg/mL (ref 0.0–100.0)

## 2020-09-04 ENCOUNTER — Ambulatory Visit
Admission: RE | Admit: 2020-09-04 | Discharge: 2020-09-04 | Disposition: A | Discharge status: Home / Self Care | Payer: Medicare Other | Source: Ambulatory Visit | Attending: Endocrinology | Admitting: Endocrinology

## 2020-09-04 DIAGNOSIS — E041 Nontoxic single thyroid nodule: Secondary | ICD-10-CM | POA: Diagnosis not present

## 2020-09-04 DIAGNOSIS — E042 Nontoxic multinodular goiter: Secondary | ICD-10-CM

## 2020-09-05 ENCOUNTER — Other Ambulatory Visit: Payer: Self-pay

## 2020-09-05 ENCOUNTER — Ambulatory Visit (INDEPENDENT_AMBULATORY_CARE_PROVIDER_SITE_OTHER): Payer: Medicare Other | Admitting: Internal Medicine

## 2020-09-05 ENCOUNTER — Encounter: Payer: Self-pay | Admitting: Internal Medicine

## 2020-09-05 VITALS — BP 106/72 | HR 78 | Ht 65.0 in | Wt 223.0 lb

## 2020-09-05 DIAGNOSIS — R072 Precordial pain: Secondary | ICD-10-CM

## 2020-09-05 DIAGNOSIS — I7121 Aneurysm of the ascending aorta, without rupture: Secondary | ICD-10-CM

## 2020-09-05 DIAGNOSIS — I712 Thoracic aortic aneurysm, without rupture: Secondary | ICD-10-CM

## 2020-09-05 DIAGNOSIS — R0600 Dyspnea, unspecified: Secondary | ICD-10-CM | POA: Diagnosis not present

## 2020-09-05 DIAGNOSIS — R0609 Other forms of dyspnea: Secondary | ICD-10-CM

## 2020-09-05 DIAGNOSIS — R600 Localized edema: Secondary | ICD-10-CM

## 2020-09-05 NOTE — Progress Notes (Signed)
Cardiology Office Note:    Date:  09/05/2020   ID:  [name], DOB 05/23/1946, MRN [id number]  PCP:  Marrian Salvage, [place]  Cardiologist:  Elouise Munroe, MD  Electrophysiologist:  None   Referring MD: Marrian Salvage,*   Chief Complaint/Reason for Referral: Follow up chest pain  History of Present Illness:    [name] is a 75 y.o. female with a history of left breast cancer with chemotherapy and radiation, GERD, hypothyroidism, mild CAD by CCTA, and diabetes mellitus type 2 diet controlled. 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  CT angiography for coronary arteries was performed February 2020, I was able to measure her sinuses at that time.  They were 48 x 48 x 49 mm.  This suggests stability of the sinus of Valsalva dilation.    She last saw me at the end of March and was concerned about a significant episode of chest pain.  I was concerned with her aortic aneurysm that we needed to exclude aortic dissection which could have been subacute.  CT angio chest performed with no evidence of dissection and stable measurements of her sinus of Valsalva aneurysm.  There was also reasonably good opacification of the pulmonary arteries for evaluation for pulmonary embolism.  I have independently reviewed these images and I do not see a significant segmental or subsegmental PE contributing.  We performed a lower extremity Doppler given her history of DVT, and concern of right leg swelling.  Bilateral lower extremity venous Dopplers were negative for PE.  Her D-dimer was elevated, but without clear source.  She is chest pain-free today and overall feels well.  She continues to have right leg swelling.  BNP was in normal range.  We have dealt with lower extremity swelling for her in the past.  Recommend conservative measures at this time given no evidence of DVT PE.  Past Medical History:  Diagnosis Date  . Allergy   . ANEMIA, IRON DEFICIENCY 05/08/2009  .  Angina   . ASYMPTOMATIC POSTMENOPAUSAL STATUS 10/11/2008  . Blood transfusion   . Blood transfusion without reported diagnosis   . Breast cancer ([place]) 09/29/11   invasive grade III ductal ca,assoc high grade dcis,ER/PR=neg  . C. difficile colitis   . Cataract   . Diverticulosis of colon (without mention of hemorrhage)   . Esophageal reflux 06/12/2008  . Gastroparesis   . GOITER, MULTINODULAR 04/02/2009  . Gout, unspecified   . H/O hiatal hernia   . History of lower GI bleeding   . History of radiation therapy 02/08/12-03/25/12   left breast,total 61gy  . Hypokalemia 05/11/2013  . Hypomagnesemia   . HYPOTHYROIDISM, POST-RADIATION 08/13/2009  . Internal hemorrhoids without mention of complication   . Kidney stones    "several"  . Leukopenia   . Migraines   . Obesity   . Osteoarthrosis, unspecified whether generalized or localized, unspecified site   . Other and unspecified hyperlipidemia   . Personal history of chemotherapy 2013  . Personal history of radiation therapy 2013   left  . PONV (postoperative nausea and vomiting)   . PUD (peptic ulcer disease)   . Short bowel syndrome   . Shortness of breath on exertion    "sometimes"  . Stricture and stenosis of esophagus   . Thyrotoxicosis without mention of goiter or other cause, without mention of thyrotoxic crisis or storm   . Type II or unspecified type diabetes mellitus without mention of complication, not stated  as uncontrolled    no med in years diet controled  . Unspecified essential hypertension   . UTI (urinary tract infection)   . Varicose veins   . VITAMIN B12 DEFICIENCY 08/30/2009    Past Surgical History:  Procedure Laterality Date  . ABDOMINAL ADHESION SURGERY  1980's thru 1990's   "several"  . ABDOMINAL HYSTERECTOMY  1970's   with BSO  . BREAST BIOPSY Left 08/13/11   left breast lower inner quadrant  . BREAST BIOPSY Right 1985   Rt exc bx, benign  . BREAST LUMPECTOMY Left 08/2011  . BREAST LUMPECTOMY W/ NEEDLE  LOCALIZATION  09/29/11   left  breast=lymph node,excision benign/ ER/PR=neg, her 2 Positive  . BUNIONECTOMY  1970's   bilateral  . CHOLECYSTECTOMY  1990's  . COLON SURGERY     "several surgeries for short bowel syndrome"  . COLONOSCOPY  2012   multiple   . DILATION AND CURETTAGE OF UTERUS    . ESOPHAGOGASTRODUODENOSCOPY  2011   multiple   . EXCISIONAL HEMORRHOIDECTOMY  11/10/2016  . EYE SURGERY     "long time ago"  . FLEXIBLE SIGMOIDOSCOPY  2011   multiple   . KIDNEY STONE SURGERY  1990's   "tried to go up & get it but pushed it further up"  . LITHOTRIPSY     "4 or 5 times"  . MASTECTOMY W/ NODES PARTIAL  09/29/11   left  . PORT-A-CATH REMOVAL Right 12/19/2013   Procedure: MINOR REMOVAL PORT-A-CATH;  Surgeon: Adin Hector, MD;  Location: [facility];  Service: General;  Laterality: Right;  . PORTACATH PLACEMENT  09/29/2011   Procedure: INSERTION PORT-A-CATH;  Surgeon: Adin Hector, MD;  Location: [facility];  Service: General;  Laterality: N/A;  . SMALL INTESTINE SURGERY    . Thyroid Ultrasound  12/1994 and 12/1995  . TOTAL KNEE ARTHROPLASTY Right 06/05/2015   Procedure: RIGHT TOTAL KNEE ARTHROPLASTY;  Surgeon: Ninetta Lights, MD;  Location: [facility];  Service: Orthopedics;  Laterality: Right;  . UPPER GASTROINTESTINAL ENDOSCOPY    . VEIN LIGATION AND STRIPPING  1980's   Right leg    Current Medications: Current Meds  Medication Sig  . albuterol (VENTOLIN HFA) 108 (90 Base) MCG/ACT inhaler Inhale 2 puffs into the lungs every 6 (six) hours as needed for wheezing or shortness of breath.  . allopurinol (ZYLOPRIM) 100 MG tablet TAKE ONE TABLET BY MOUTH EVERY DAY  . amitriptyline (ELAVIL) 50 MG tablet TAKE ONE TABLET BY MOUTH EVERY DAY AT BEDTIME- (USE CAUTION - MAY CAUSE DROWSINESS)  . calcium carbonate (TUMS - DOSED IN MG ELEMENTAL CALCIUM) 500 MG chewable tablet Chew 1 tablet by mouth as needed for indigestion or heartburn.  . Calcium Carbonate-Vitamin D  (CALCIUM-VITAMIN D) 600-200 MG-UNIT CAPS Take 1 capsule by mouth daily.  . Cyanocobalamin (VITAMIN B 12 PO) Take 1 tablet by mouth 2 (two) times daily.   Marland Kitchen dicyclomine (BENTYL) 20 MG tablet Take 1 tablet (20 mg total) by mouth 3 (three) times daily as needed for spasms.  Marland Kitchen esomeprazole (NEXIUM) 40 MG capsule Take 1 capsule (40 mg total) by mouth 2 (two) times daily before a meal.  . ferrous sulfate 325 (65 FE) MG tablet Take 1 tablet (325 mg total) by mouth daily with breakfast.  . fluticasone (FLONASE) 50 MCG/ACT nasal spray Place 2 sprays into both nostrils daily.  . Fluticasone-Umeclidin-Vilant (TRELEGY ELLIPTA) 200-62.5-25 MCG/INH AEPB Inhale daily as directed  . furosemide (LASIX) 40 MG tablet Take 1 tablet (  40 mg total) by mouth daily.  Marland Kitchen HYDROcodone-acetaminophen (NORCO/VICODIN) 5-325 MG tablet Take 1 tablet by mouth every 6 (six) hours as needed.  Marland Kitchen levothyroxine (SYNTHROID) 175 MCG tablet Take 1 tablet (175 mcg total) by mouth daily before breakfast.  . loratadine (CLARITIN) 10 MG tablet Take 1 tablet (10 mg total) by mouth daily.  Marland Kitchen lovastatin (MEVACOR) 20 MG tablet TAKE ONE TABLET BY MOUTH EVERY DAY AT 6:00PM  . Magnesium 500 MG CAPS Take 1 mg by mouth 2 (two) times daily.   . nitroGLYCERIN (NITROSTAT) 0.4 MG SL tablet Place 1 tablet (0.4 mg total) under the tongue every 5 (five) minutes as needed.  . pantoprazole (PROTONIX) 40 MG tablet TAKE 1 TABLET BY MOUTH 30 MINUTES PRIOR TO BREAKFAST AND SUPPER  . pregabalin (LYRICA) 75 MG capsule Take 1 capsule (75 mg total) by mouth 2 (two) times daily. For nerve pain  . Thiamine HCl (THIAMINE PO) Take 1 tablet by mouth daily.      Allergies:   Aspirin, Trazodone and nefazodone, Flagyl [metronidazole], and Morphine and related   Social History   Tobacco Use  . Smoking status: Former Smoker    Packs/day: 1.00    Years: 10.00    Pack years: 10.00    Types: Cigarettes    Quit date: 09/22/1985    Years since quitting: 34.9  . Smokeless  tobacco: Never Used  Vaping Use  . Vaping Use: Never used  Substance Use Topics  . Alcohol use: No    Comment: 09/29/11 "used to drink socially years ago"  . Drug use: No     Family History: The patient's family history includes Breast cancer in her cousin, maternal aunt, paternal aunt, and son; Colon cancer in her paternal uncle; Diabetes in her father and mother; Esophageal cancer in her son; Heart disease in her mother; Hypertension in her father; Kidney disease in her brother and sister. There is no history of Rectal cancer or Stomach cancer.  ROS:   Please see the history of present illness.    All other systems reviewed and are negative.  EKGs/Labs/Other Studies Reviewed:    The following studies were reviewed today:  EKG:  NSR, LVH  I have independently reviewed the images from CT angio chest aorta 08/28/2020, independent interpretation noted above.  Recent Labs: 02/12/2020: ALT 18; Hemoglobin 10.2; Platelets 287 08/20/2020: TSH 4.49 08/28/2020: BNP 35.7; BUN 17; Creatinine, Ser 0.90; Potassium 3.8; Sodium 144  Recent Lipid Panel    Component Value Date/Time   CHOL 150 03/17/2018 1200   TRIG 112.0 03/17/2018 1200   HDL 75.50 03/17/2018 1200   CHOLHDL 2 03/17/2018 1200   VLDL 22.4 03/17/2018 1200   LDLCALC 52 03/17/2018 1200   LDLDIRECT 53.0 10/29/2015 1338    Physical Exam:    VS:  BP 106/72 (BP Location: Right Arm, Patient Position: Sitting, Cuff Size: Large)   Pulse 78   Ht 5\' 5"  (1.651 m)   Wt 223 lb (101.2 kg)   BMI 37.11 kg/m     Wt Readings from Last 5 Encounters:  09/05/20 223 lb (101.2 kg)  08/28/20 225 lb (102.1 kg)  08/20/20 225 lb 3.2 oz (102.2 kg)  07/08/20 226 lb 12.8 oz (102.9 kg)  05/22/20 234 lb 6.4 oz (106.3 kg)    Constitutional: No acute distress Eyes: sclera non-icteric, normal conjunctiva and lids ENMT: normal dentition, moist mucous membranes Cardiovascular: regular rhythm, normal rate, no murmurs. S1 and S2 normal. Radial pulses  normal bilaterally. No jugular  venous distention.  Respiratory: clear to auscultation bilaterally GI : normal bowel sounds, soft and nontender. No distention.   MSK: extremities warm, well perfused. No edema.  NEURO: grossly nonfocal exam, moves all extremities. PSYCH: alert and oriented x 3, normal mood and affect.   ASSESSMENT:    1. Ascending aortic aneurysm ([place])   2. Precordial pain   3. Dyspnea on exertion   4. Bilateral leg edema    PLAN:    Ascending aortic aneurysm ([facility]) - Plan: EKG 12-Lead Precordial pain -Though her D-dimer was elevated, no evidence of significant PE or DVT or aortic dissection.  I have asked her to observe her symptom of chest pain and see if or if it recurs.  If it does I have recommended that she go to the ER for evaluation for ischemia.  Unclear source of chest pain, echocardiogram obtained after chest pain was grossly normal from biventricular function standpoint.  Dyspnea on exertion-no significant symptoms at the moment, BNP normal, observe.  Bilateral leg edema-worsened right leg swelling at the current time, consider using as needed Lasix, elevate leg and use compression socks.  Return to clinic in 6 months however if recurrence of chest pain please notify me and patient will be seen sooner.  Total time of encounter: 20 minutes total time of encounter, including 15 minutes spent in face-to-face patient care on the date of this encounter. This time includes coordination of care and counseling regarding above mentioned problem list. Remainder of non-face-to-face time involved reviewing chart documents/testing relevant to the patient encounter and documentation in the medical record. I have independently reviewed documentation from referring provider.   Cherlynn Kaiser, MD, [facility]    Medication Adjustments/Labs and Tests Ordered: Current medicines are reviewed at length with the patient today.  Concerns regarding medicines  are outlined above.   Orders Placed This Encounter  Procedures  . EKG 12-Lead    Patient Instructions  Medication Instructions:  No Changes In Medications at this time.  *If you need a refill on your cardiac medications before your next appointment, please call your pharmacy*  Follow-Up: At [facility], you and your health needs are our priority.  As part of our continuing mission to provide you with exceptional heart care, we have created designated Provider Care Teams.  These Care Teams include your primary Cardiologist (physician) and Advanced Practice Providers (APPs -  Physician Assistants and Nurse Practitioners) who all work together to provide you with the care you need, when you need it.  We recommend signing up for the patient portal called "MyChart".  Sign up information is provided on this After Visit Summary.  MyChart is used to connect with patients for Virtual Visits (Telemedicine).  Patients are able to view lab/test results, encounter notes, upcoming appointments, etc.  Non-urgent messages can be sent to your provider as well.   To learn more about what you can do with MyChart, go to [email or web address].    Your next appointment:   6 month(s)  The format for your next appointment:   In Person  Provider:   Cherlynn Kaiser, MD

## 2020-09-05 NOTE — Patient Instructions (Signed)
Medication Instructions:  °No Changes In Medications at this time.  °*If you need a refill on your cardiac medications before your next appointment, please call your pharmacy* ° °Follow-Up: °At [facility], you and your health needs are our priority.  As part of our continuing mission to provide you with exceptional heart care, we have created designated Provider Care Teams.  These Care Teams include your primary Cardiologist (physician) and Advanced Practice Providers (APPs -  Physician Assistants and Nurse Practitioners) who all work together to provide you with the care you need, when you need it. ° °We recommend signing up for the patient portal called "MyChart".  Sign up information is provided on this After Visit Summary.  MyChart is used to connect with patients for Virtual Visits (Telemedicine).  Patients are able to view lab/test results, encounter notes, upcoming appointments, etc.  Non-urgent messages can be sent to your provider as well.   °To learn more about what you can do with MyChart, go to [email or web address].   ° °Your next appointment:   °6 month(s) ° °The format for your next appointment:   °In Person ° °Provider:   °Gayatri Acharya, MD °

## 2020-09-24 ENCOUNTER — Other Ambulatory Visit: Payer: Self-pay | Admitting: Family

## 2020-09-30 ENCOUNTER — Other Ambulatory Visit: Payer: Self-pay | Admitting: Family

## 2020-09-30 DIAGNOSIS — Z1231 Encounter for screening mammogram for malignant neoplasm of breast: Secondary | ICD-10-CM

## 2020-10-03 DIAGNOSIS — Z23 Encounter for immunization: Secondary | ICD-10-CM | POA: Diagnosis not present

## 2020-10-11 DIAGNOSIS — N1831 Chronic kidney disease, stage 3a: Secondary | ICD-10-CM | POA: Diagnosis not present

## 2020-10-16 DIAGNOSIS — D631 Anemia in chronic kidney disease: Secondary | ICD-10-CM | POA: Diagnosis not present

## 2020-10-16 DIAGNOSIS — N2581 Secondary hyperparathyroidism of renal origin: Secondary | ICD-10-CM | POA: Diagnosis not present

## 2020-10-16 DIAGNOSIS — N1831 Chronic kidney disease, stage 3a: Secondary | ICD-10-CM | POA: Diagnosis not present

## 2020-10-16 DIAGNOSIS — I129 Hypertensive chronic kidney disease with stage 1 through stage 4 chronic kidney disease, or unspecified chronic kidney disease: Secondary | ICD-10-CM | POA: Diagnosis not present

## 2020-10-25 ENCOUNTER — Other Ambulatory Visit: Payer: Self-pay | Admitting: Endocrinology

## 2020-11-05 DIAGNOSIS — N1831 Chronic kidney disease, stage 3a: Secondary | ICD-10-CM | POA: Diagnosis not present

## 2020-11-22 ENCOUNTER — Other Ambulatory Visit: Payer: Self-pay

## 2020-11-22 ENCOUNTER — Ambulatory Visit
Admission: RE | Admit: 2020-11-22 | Discharge: 2020-11-22 | Disposition: A | Discharge status: Home / Self Care | Payer: Medicare Other | Source: Ambulatory Visit | Attending: Family | Admitting: Family

## 2020-11-22 DIAGNOSIS — Z1231 Encounter for screening mammogram for malignant neoplasm of breast: Secondary | ICD-10-CM

## 2020-11-25 ENCOUNTER — Telehealth: Payer: Self-pay | Admitting: Family Medicine

## 2020-11-25 NOTE — Telephone Encounter (Signed)
Patient called asking for a refill on pregabalin (LYRICA) 75 MG capsule to be sent to [facility] by mail.

## 2020-11-26 MED ORDER — PREGABALIN 75 MG PO CAPS: 75.0000 mg | ORAL_CAPSULE | Freq: Two times a day (BID) | ORAL | 3 refills | Status: DC

## 2020-11-26 NOTE — Telephone Encounter (Signed)
This prescription requires me to print it and mail it to the [place] and I need to fill out a form.  I have done so but I does need your full Social Security number.  Please let me know what it is and we will mail it off.

## 2020-11-26 NOTE — Telephone Encounter (Signed)
This has been taken care of. Rx has been mailed.

## 2020-12-03 DIAGNOSIS — D492 Neoplasm of unspecified behavior of bone, soft tissue, and skin: Secondary | ICD-10-CM | POA: Diagnosis not present

## 2020-12-03 DIAGNOSIS — H40013 Open angle with borderline findings, low risk, bilateral: Secondary | ICD-10-CM | POA: Diagnosis not present

## 2020-12-03 DIAGNOSIS — H25811 Combined forms of age-related cataract, right eye: Secondary | ICD-10-CM | POA: Diagnosis not present

## 2020-12-03 DIAGNOSIS — Z961 Presence of intraocular lens: Secondary | ICD-10-CM | POA: Diagnosis not present

## 2020-12-11 DIAGNOSIS — H02824 Cysts of left upper eyelid: Secondary | ICD-10-CM | POA: Diagnosis not present

## 2020-12-11 DIAGNOSIS — D492 Neoplasm of unspecified behavior of bone, soft tissue, and skin: Secondary | ICD-10-CM | POA: Diagnosis not present

## 2020-12-11 DIAGNOSIS — L720 Epidermal cyst: Secondary | ICD-10-CM | POA: Diagnosis not present

## 2020-12-13 ENCOUNTER — Other Ambulatory Visit: Payer: Self-pay

## 2020-12-13 ENCOUNTER — Ambulatory Visit (INDEPENDENT_AMBULATORY_CARE_PROVIDER_SITE_OTHER): Payer: Medicare Other | Admitting: Internal Medicine

## 2020-12-13 ENCOUNTER — Encounter: Payer: Self-pay | Admitting: Internal Medicine

## 2020-12-13 VITALS — BP 130/80 | HR 88 | Temp 98.0°F | Resp 18 | Ht 65.0 in | Wt 229.4 lb

## 2020-12-13 DIAGNOSIS — E1169 Type 2 diabetes mellitus with other specified complication: Secondary | ICD-10-CM | POA: Diagnosis not present

## 2020-12-13 DIAGNOSIS — E785 Hyperlipidemia, unspecified: Secondary | ICD-10-CM

## 2020-12-13 DIAGNOSIS — E559 Vitamin D deficiency, unspecified: Secondary | ICD-10-CM | POA: Diagnosis not present

## 2020-12-13 DIAGNOSIS — K222 Esophageal obstruction: Secondary | ICD-10-CM

## 2020-12-13 DIAGNOSIS — M1A9XX0 Chronic gout, unspecified, without tophus (tophi): Secondary | ICD-10-CM | POA: Diagnosis not present

## 2020-12-13 DIAGNOSIS — E538 Deficiency of other specified B group vitamins: Secondary | ICD-10-CM

## 2020-12-13 LAB — MICROALBUMIN / CREATININE URINE RATIO
Creatinine,U: 74.1 mg/dL
Microalb Creat Ratio: 0.9 mg/g (ref 0.0–30.0)
Microalb, Ur: <0.7 mg/dL (ref 0.0–1.9)

## 2020-12-13 LAB — LIPID PANEL
Cholesterol: 150 mg/dL (ref 0–200)
HDL: 70 mg/dL (ref >39.00)
LDL Cholesterol: 55 mg/dL (ref 0–99)
NonHDL: 80.46
Total CHOL/HDL Ratio: 2
Triglycerides: 128 mg/dL (ref 0.0–149.0)
VLDL: 25.6 mg/dL (ref 0.0–40.0)

## 2020-12-13 LAB — URIC ACID: Uric Acid, Serum: 6.3 mg/dL (ref 2.4–7.0)

## 2020-12-13 LAB — VITAMIN D 25 HYDROXY (VIT D DEFICIENCY, FRACTURES): VITD: 23.61 ng/mL — ABNORMAL LOW (ref 30.00–100.00)

## 2020-12-13 LAB — VITAMIN B12: Vitamin B-12: 803 pg/mL (ref 211–911)

## 2020-12-13 MED ORDER — OZEMPIC (0.25 OR 0.5 MG/DOSE) 2 MG/1.5ML ~~LOC~~ SOPN: PEN_INJECTOR | SUBCUTANEOUS | 0 refills | Status: DC

## 2020-12-13 NOTE — Assessment & Plan Note (Signed)
Rx ozempic for weight loss. Follow up 2-3 months.

## 2020-12-13 NOTE — Assessment & Plan Note (Signed)
Checking uric acid and adjust allopurinol 100 mg daily as needed for goal <6.

## 2020-12-13 NOTE — Assessment & Plan Note (Signed)
Rx ozempic for weight loss and diabetes control. HgA1c <6 off medications for some time. On statin but not on ACE-I or ARB so checking microalbumin to creatinine ratio and foot exam done. Will get records for eye exam.

## 2020-12-13 NOTE — Assessment & Plan Note (Signed)
Checking lipid panel and adjust lovastatin as needed.

## 2020-12-13 NOTE — Assessment & Plan Note (Signed)
Taking oral and checking B12 today. If low may need to return to B12 shots monthly.

## 2020-12-13 NOTE — Assessment & Plan Note (Signed)
Taking nexium BID. Overall stable.

## 2020-12-13 NOTE — Patient Instructions (Addendum)
We have sent in the ozempic to do 0.25 mg injection weekly for 4 weeks, then increase to 0.5 mg weekly for 4 weeks then come back for a visit to see how this is doing.   We are checking the labs today and will call you back about the results.

## 2020-12-13 NOTE — Assessment & Plan Note (Signed)
Checking vitamin-D today.

## 2020-12-13 NOTE — Progress Notes (Signed)
   Subjective:   Patient ID: [name], female    DOB: 29-May-1946, 75 y.o.   MRN: [id number]  HPI The patient is a 75 YO female coming in for transfer of care and several concerns including gout, weight, diabetes, cholesterol.   PMH, [facility], social history reviewed and updated  Review of Systems  Constitutional: Negative.   HENT: Negative.    Eyes: Negative.   Respiratory:  Negative for cough, chest tightness and shortness of breath.   Cardiovascular:  Positive for leg swelling. Negative for chest pain and palpitations.  Gastrointestinal:  Negative for abdominal distention, abdominal pain, constipation, diarrhea, nausea and vomiting.  Musculoskeletal:  Positive for arthralgias.  Skin: Negative.   Neurological: Negative.   Psychiatric/Behavioral: Negative.     Objective:  Physical Exam Constitutional:      Appearance: She is well-developed. She is obese.  HENT:     Head: Normocephalic and atraumatic.  Cardiovascular:     Rate and Rhythm: Normal rate and regular rhythm.  Pulmonary:     Effort: Pulmonary effort is normal. No respiratory distress.     Breath sounds: Normal breath sounds. No wheezing or rales.  Abdominal:     General: Bowel sounds are normal. There is no distension.     Palpations: Abdomen is soft.     Tenderness: There is no abdominal tenderness. There is no rebound.  Musculoskeletal:        General: Swelling and tenderness present.     Cervical back: Normal range of motion.  Skin:    General: Skin is warm and dry.     Comments: Foot exam done  Neurological:     Mental Status: She is alert and oriented to person, place, and time.     Coordination: Coordination normal.    Vitals:   12/13/20 0940  BP: 130/80  Pulse: 88  Resp: 18  Temp: 98 F (36.7 C)  TempSrc: Oral  SpO2: 96%  Weight: 229 lb 6.4 oz (104.1 kg)  Height: 5\' 5"  (1.651 m)    This visit occurred during the SARS-CoV-2 public health emergency.  Safety protocols were in place,  including screening questions prior to the visit, additional usage of staff PPE, and extensive cleaning of exam room while observing appropriate contact time as indicated for disinfecting solutions.   Assessment & Plan:

## 2020-12-27 ENCOUNTER — Other Ambulatory Visit: Payer: Self-pay | Admitting: Family

## 2020-12-30 DIAGNOSIS — H2511 Age-related nuclear cataract, right eye: Secondary | ICD-10-CM | POA: Diagnosis not present

## 2020-12-31 DIAGNOSIS — H2511 Age-related nuclear cataract, right eye: Secondary | ICD-10-CM | POA: Diagnosis not present

## 2021-01-01 ENCOUNTER — Telehealth: Payer: Self-pay | Admitting: Gastroenterology

## 2021-01-01 NOTE — Telephone Encounter (Signed)
Patient reports nocturnal reflux.  Last OV was in 2020.  She will come in and see [name] on 01/02/21 9:50

## 2021-01-01 NOTE — Telephone Encounter (Signed)
Pt has been dealing with severe acid refluc in the evening. She would like something prescribed.

## 2021-01-02 ENCOUNTER — Ambulatory Visit (INDEPENDENT_AMBULATORY_CARE_PROVIDER_SITE_OTHER): Payer: Medicare Other | Admitting: Gastroenterology

## 2021-01-02 ENCOUNTER — Other Ambulatory Visit: Payer: Self-pay

## 2021-01-02 ENCOUNTER — Other Ambulatory Visit (INDEPENDENT_AMBULATORY_CARE_PROVIDER_SITE_OTHER): Payer: Medicare Other

## 2021-01-02 ENCOUNTER — Encounter: Payer: Self-pay | Admitting: Gastroenterology

## 2021-01-02 VITALS — BP 120/80 | HR 64 | Ht 65.0 in | Wt 234.0 lb

## 2021-01-02 DIAGNOSIS — D509 Iron deficiency anemia, unspecified: Secondary | ICD-10-CM

## 2021-01-02 DIAGNOSIS — D649 Anemia, unspecified: Secondary | ICD-10-CM

## 2021-01-02 DIAGNOSIS — E538 Deficiency of other specified B group vitamins: Secondary | ICD-10-CM

## 2021-01-02 DIAGNOSIS — K219 Gastro-esophageal reflux disease without esophagitis: Secondary | ICD-10-CM | POA: Diagnosis not present

## 2021-01-02 LAB — CBC WITH DIFFERENTIAL/PLATELET
Basophils Absolute: 0 10*3/uL (ref 0.0–0.1)
Basophils Relative: 0.3 % (ref 0.0–3.0)
Eosinophils Absolute: 0.1 10*3/uL (ref 0.0–0.7)
Eosinophils Relative: 2 % (ref 0.0–5.0)
HCT: 31.1 % — ABNORMAL LOW (ref 36.0–46.0)
Hemoglobin: 10.1 g/dL — ABNORMAL LOW (ref 12.0–15.0)
Lymphocytes Relative: 36.6 % (ref 12.0–46.0)
Lymphs Abs: 1.5 10*3/uL (ref 0.7–4.0)
MCHC: 32.5 g/dL (ref 30.0–36.0)
MCV: 89.6 fl (ref 78.0–100.0)
Monocytes Absolute: 0.4 10*3/uL (ref 0.1–1.0)
Monocytes Relative: 9.2 % (ref 3.0–12.0)
Neutro Abs: 2.1 10*3/uL (ref 1.4–7.7)
Neutrophils Relative %: 51.9 % (ref 43.0–77.0)
Platelets: 213 10*3/uL (ref 150.0–400.0)
RBC: 3.47 Mil/uL — ABNORMAL LOW (ref 3.87–5.11)
RDW: 14.6 % (ref 11.5–15.5)
WBC: 4.1 10*3/uL (ref 4.0–10.5)

## 2021-01-02 MED ORDER — FAMOTIDINE 40 MG PO TABS: 40.0000 mg | ORAL_TABLET | Freq: Every day | ORAL | 3 refills | Status: DC

## 2021-01-02 NOTE — Progress Notes (Signed)
    History of Present Illness: This is a 75 year old female with GERD complaining of burning in her throat at night and hoarseness.  She is currently taking both Nexium and pantoprazole.  She is sleeping with the head of her bed elevated and has her last meal around 5 PM.  She states she has had intermittent difficulties with solid food dysphagia but not as severe as previously in 2020.   EGD 05/2019 - Benign-appearing esophageal stenoses. Dilated. - Medium-sized hiatal hernia. - Normal duodenal bulb and second portion of the duodenum.  Current Medications, Allergies, Past Medical History, Past Surgical History, Family History and Social History were reviewed in [employer] record.   Physical Exam: General: Well developed, well nourished, no acute distress Head: Normocephalic and atraumatic Eyes: Sclerae anicteric, EOMI Ears: Normal auditory acuity Mouth: Not examined, mask on during Covid-19 pandemic Lungs: Clear throughout to auscultation Heart: Regular rate and rhythm; no murmurs, rubs or bruits Abdomen: Soft, non tender and non distended. No masses, hepatosplenomegaly or hernias noted. Normal Bowel sounds Rectal: Not done Musculoskeletal: Symmetrical with no gross deformities  Pulses:  Normal pulses noted Extremities: No clubbing, cyanosis, edema or deformities noted Neurological: Alert oriented x 4, grossly nonfocal Psychological:  Alert and cooperative. Normal mood and affect   Assessment and Recommendations:  GERD with medium sized hiatal hernia and a history of esophageal strictures.  Suspected LPR.  Discontinue pantoprazole.  Continue Nexium 40 mg taken 30 minutes before breakfast and dinner.  Begin famotidine 40 mg at bedtime.  I have asked her to call in 3 weeks to report progress.  If symptoms are not improving consider increasing famotidine to 40 mg twice daily and consider adding metoclopramide at bedtime.  REV in 6 weeks.  Hoarseness and sore  throat.  Presumptive cause is GERD with LPR.  If symptoms do not respond within the next several weeks to intensified reflux therapy proceed with ENT referral. IDA.  Repeat CBC today.

## 2021-01-02 NOTE — Patient Instructions (Addendum)
Your provider has requested that you go to the basement level for lab work before leaving today. Press "B" on the elevator. The lab is located at the first door on the left as you exit the elevator.   Stop taking pantoprazole.   Continue taking Nexium 40 mg twice daily 30 minutes before breakfast and dinner.   Patient advised to avoid spicy, acidic, citrus, chocolate, mints, fruit and fruit juices.  Limit the intake of caffeine, alcohol and Soda.  Don't exercise too soon after eating.  Don't lie down within 3-4 hours of eating.  Elevate the head of your bed.  You have an appointment to follow up with [name] in 6 weeks but call our office back in 3 weeks if your symptoms are not better.   Due to recent changes in healthcare laws, you may see the results of your imaging and laboratory studies on MyChart before your provider has had a chance to review them.  We understand that in some cases there may be results that are confusing or concerning to you. Not all laboratory results come back in the same time frame and the provider may be waiting for multiple results in order to interpret others.  Please give [place] 48 hours in order for your provider to thoroughly review all the results before contacting the office for clarification of your results.   The [facility] providers would like to encourage you to use [facility] to communicate with providers for non-urgent requests or questions.  Due to long hold times on the telephone, sending your provider a message by [facility] may be a faster and more efficient way to get a response.  Please allow 48 business hours for a response.  Please remember that this is for non-urgent requests.   Normal BMI (Body Mass Index- based on height and weight) is between 23 and 30. Your BMI today is Body mass index is 38.94 kg/m. Marland Kitchen Please consider follow up  regarding your BMI with your Primary Care Provider.  Thank you for choosing me and [facility].  Pricilla Riffle.  Dagoberto Ligas., MD., [facility]

## 2021-01-03 ENCOUNTER — Other Ambulatory Visit (INDEPENDENT_AMBULATORY_CARE_PROVIDER_SITE_OTHER): Payer: Medicare Other

## 2021-01-03 DIAGNOSIS — E538 Deficiency of other specified B group vitamins: Secondary | ICD-10-CM | POA: Diagnosis not present

## 2021-01-03 DIAGNOSIS — D649 Anemia, unspecified: Secondary | ICD-10-CM | POA: Diagnosis not present

## 2021-01-03 LAB — IBC + FERRITIN
Ferritin: 263.4 ng/mL (ref 10.0–291.0)
Iron: 80 ug/dL (ref 42–145)
Saturation Ratios: 18 % — ABNORMAL LOW (ref 20.0–50.0)
Transferrin: 318 mg/dL (ref 212.0–360.0)

## 2021-01-03 LAB — FOLATE: Folate: 17.8 ng/mL

## 2021-01-05 NOTE — Progress Notes (Signed)
HeaHPI F former smoker ( 20 pk yr, quit 1980) referred for shortness of breath by Dr Acharya/ Cardiology for eval of Dyspnea and wheezing, complicated by HTN, Hiatal Hernia, GERD/ stricture, Gatroparesis, Hx C.diff colitis, Goiter/ Hypothyroid p RAI, DM2, Eczema, Breast Cancer L/ chemo/ XRT, FE def Anemia, Renal Insuficiency,  ECHO- nl w mild AR,  Negative ischemia w/u w non-obstructive CAD on CCTA,  PFT 03/22/20- minimal obstruction possible, FEV1/FVC 0.80, insignificant response to bronchodilator, mild Diffusion deficit Overnight Oximetry 03/07/20- O2 sat was 5% or more below avg for at least 5 cumulative minutes. (7 min 44 sec), Qualifying for sleep O2 Group 1 alternate. ==========================================================   07/08/20- 78 yoF former smoker ( 20 pk yr, quit 1980) referred for shortness of breath by Dr Acharya/ Cardiology for eval of Dyspnea and wheezing, complicated by HTN, Hiatal Hernia, GERD/ stricture, Gastroparesis, Hx C.diff colitis, Goiter/ Hypothyroid p RAI, DM2, Eczema, Breast Cancer L/ chemo/ XRT, FE def Anemia, Renal Insuficiency, Aortic Atherosclerosis,  O2 2L sleep and portable/ [employer] ECHO- nl w mild AR,  PFT 03/22/20- minimal obstruction possible, FEV1/FVC 0.80, insignificant response to bronchodilator, mild Diffusion deficit Anemia chronic- Hgb 10.2.Nl indices May want to consider CPET Body weight today-226 lbs Covid vax-3 Phizer Flu vax-had Arrival O2 sat 99% on room air today. Using rescue inhaler 2-3x/ day.  Seemed a little vague and needed some prompting about meds, DME company. We discussed PFT showing only minimal obstruction. She notes occasional wheeze and continues Trelegy 200 at night, albuterol hfa 2-3x/ day with no major exacerbation. Mainly notes DOE walking maybe 50 feet, stairs, putting groceries away. Stable, little variation day to day and ok at rest.  Uses O2 at night but not her portable. No cough or phlegm. She is aware of chronic  anemia. Admits snoring, some dry mouth. Minimizes daytime sleepiness.  01/06/21- 45 yoF former smoker ( 20 pk yr, quit 1980) referred for shortness of breath by Dr Acharya/ Cardiology for eval of Dyspnea and wheezing, complicated by HTN, Hiatal Hernia, GERD/ stricture, Gastroparesis, Hx C.diff colitis, Goiter/ Hypothyroid p RAI, DM2, Eczema, Breast Cancer L/ chemo/ XRT, FE def Anemia, Renal Insuficiency, Aortic Atherosclerosis,  O2 2L sleep and portable/ [employer] ECHO- nl w mild AR,  PFT 03/22/20- minimal obstruction possible, FEV1/FVC 0.80, insignificant response to bronchodilator, mild Diffusion deficit Anemia chronic- Hgb 10.2.Nl indices - ProAirHFA, Flonase,      Semaglutide Recent Hgb still 10.1,  CTa chest/ aorta- 08/28/20- no acute findings Covid vax- 4 Phizer Hears chest rattle, worse at night lying down, not relieved by inhaler. Doesn't know names of meds- dropped off Trelegy. I think she will do better with nebulizer machine.   ROS-see HPI   + = positive Constitutional:    weight loss, night sweats, fevers, chills, +fatigue, lassitude. HEENT:    headaches, difficulty swallowing, tooth/dental problems, sore throat,       sneezing, itching, ear ache, nasal congestion, post nasal drip, snoring CV:    chest pain, orthopnea, PND, +swelling in lower extremities, anasarca,                                   dizziness, palpitations Resp:  + shortness of breath with exertion or at rest.                productive cough,   non-productive cough, coughing up of blood.  change in color of mucus.  +wheezing.   Skin:    rash or lesions. GI:  + heartburn, indigestion, abdominal pain, nausea, vomiting, diarrhea,                 change in bowel habits, loss of appetite GU: dysuria, change in color of urine, no urgency or frequency.   flank pain. MS:   joint pain, stiffness, decreased range of motion, back pain. Neuro-     nothing unusual Psych:  change in mood or affect.  depression or  anxiety.   memory loss.  OBJ- Physical Exam General- Alert, Oriented, Affect-appropriate, Distress- none acute, + obese Skin- rash-none, lesions- none, excoriation- none Lymphadenopathy- none Head- atraumatic            Eyes- Gross vision intact, PERRLA, conjunctivae and secretions clear            Ears- Hearing, canals-normal            Nose- Clear, no-Septal dev, mucus, polyps, erosion, perforation             Throat- Mallampati III-IV , mucosa clear , drainage- none, tonsils- atrophic, + teeth Neck- flexible , trachea midline, no stridor , thyroid nl, carotid no bruit Chest - symmetrical excursion , unlabored           Heart/CV- RRR , no murmur , no gallop  , no rub, nl s1 s2                           -JVD-none , edema-+1 feet, stasis changes- none, varices- none           Lung- clear to P&A, wheeze- none, cough- none , dullness-none, rub- none           Chest wall-  Abd-  Br/ Gen/ Rectal- Not done, not indicated Neuro- grossly intact to observation. +Question mild memory deficit.

## 2021-01-06 ENCOUNTER — Encounter: Payer: Self-pay | Admitting: Internal Medicine

## 2021-01-06 ENCOUNTER — Ambulatory Visit (INDEPENDENT_AMBULATORY_CARE_PROVIDER_SITE_OTHER): Payer: Medicare Other

## 2021-01-06 ENCOUNTER — Ambulatory Visit (INDEPENDENT_AMBULATORY_CARE_PROVIDER_SITE_OTHER): Payer: Medicare Other | Admitting: Internal Medicine

## 2021-01-06 ENCOUNTER — Other Ambulatory Visit: Payer: Self-pay

## 2021-01-06 VITALS — BP 132/80 | HR 82 | Temp 98.0°F | Ht 65.0 in | Wt 236.6 lb

## 2021-01-06 DIAGNOSIS — R0609 Other forms of dyspnea: Secondary | ICD-10-CM

## 2021-01-06 DIAGNOSIS — Z0000 Encounter for general adult medical examination without abnormal findings: Secondary | ICD-10-CM | POA: Diagnosis not present

## 2021-01-06 DIAGNOSIS — J4530 Mild persistent asthma, uncomplicated: Secondary | ICD-10-CM | POA: Diagnosis not present

## 2021-01-06 DIAGNOSIS — R0600 Dyspnea, unspecified: Secondary | ICD-10-CM

## 2021-01-06 DIAGNOSIS — G4734 Idiopathic sleep related nonobstructive alveolar hypoventilation: Secondary | ICD-10-CM | POA: Diagnosis not present

## 2021-01-06 DIAGNOSIS — J4540 Moderate persistent asthma, uncomplicated: Secondary | ICD-10-CM | POA: Diagnosis not present

## 2021-01-06 DIAGNOSIS — J45909 Unspecified asthma, uncomplicated: Secondary | ICD-10-CM | POA: Diagnosis not present

## 2021-01-06 MED ORDER — ALBUTEROL SULFATE HFA 108 (90 BASE) MCG/ACT IN AERS: 2.0000 | INHALATION_SPRAY | Freq: Four times a day (QID) | RESPIRATORY_TRACT | 4 refills | Status: DC | PRN

## 2021-01-06 NOTE — Progress Notes (Signed)
I connected with [name] today by telephone and verified that I am speaking with the correct person using two identifiers. Location patient: home Location provider: work Persons participating in the virtual visit: Floretta Marucci and [employer], LPN.   I discussed the limitations, risks, security and privacy concerns of performing an evaluation and management service by telephone and the availability of in person appointments. I also discussed with the patient that there may be a patient responsible charge related to this service. The patient expressed understanding and verbally consented to this telephonic visit.    Interactive audio and video telecommunications were attempted between this provider and patient, however failed, due to patient having technical difficulties OR patient did not have access to video capability.  We continued and completed visit with audio only.  Some vital signs may be absent or patient reported.   Time Spent with patient on telephone encounter: 30 minutes  Subjective:   [name] is a 75 y.o. female who presents for Medicare Annual (Subsequent) preventive examination.  Review of Systems     Cardiac Risk Factors include: advanced age (>88mn, >>68women);diabetes mellitus;dyslipidemia;family history of premature cardiovascular disease;hypertension     Objective:    There were no vitals filed for this visit. There is no height or weight on file to calculate BMI.  Advanced Directives 01/06/2021 06/28/2019 02/08/2019 02/08/2019 03/17/2018 12/19/2016 09/12/2016  Does Patient Have a Medical Advance Directive? Yes Yes Yes Yes No Yes No  Type of Advance Directive Living will [place] Hawk[place] will Living will Living will - [place] will -  Does patient want to make changes to medical advance directive? No - Patient declined - No - Patient declined - - - -  Copy of HThermalitoin  Chart? - No - copy requested - - - - -  Would patient like information on creating a medical advance directive? - No - Patient declined - - - - No - Patient declined  Pre-existing out of facility DNR order (yellow form or pink MOST form) - - - - - - -    Current Medications (verified) Outpatient Encounter Medications as of 01/06/2021  Medication Sig   albuterol (VENTOLIN HFA) 108 (90 Base) MCG/ACT inhaler Inhale 2 puffs into the lungs every 6 (six) hours as needed for wheezing or shortness of breath.   allopurinol (ZYLOPRIM) 100 MG tablet TAKE ONE TABLET BY MOUTH EVERY DAY   amitriptyline (ELAVIL) 50 MG tablet TAKE ONE TABLET BY MOUTH EVERY DAY AT BEDTIME (USE CAUTION - MAY CAUSE DROWSINESS)   calcium carbonate (TUMS - DOSED IN MG ELEMENTAL CALCIUM) 500 MG chewable tablet Chew 1 tablet by mouth as needed for indigestion or heartburn.   Calcium Carbonate-Vitamin D (CALCIUM-VITAMIN D) 600-200 MG-UNIT CAPS Take 1 capsule by mouth daily.   Cyanocobalamin (VITAMIN B 12 PO) Take 1 tablet by mouth 2 (two) times daily.    dicyclomine (BENTYL) 20 MG tablet Take 1 tablet (20 mg total) by mouth 3 (three) times daily as needed for spasms.   erythromycin ophthalmic ointment Place into the left eye in the morning and at bedtime.   famotidine (PEPCID) 40 MG tablet Take 1 tablet (40 mg total) by mouth at bedtime.   ferrous sulfate 325 (65 FE) MG tablet Take 1 tablet (325 mg total) by mouth daily with breakfast.   fluticasone (FLONASE) 50 MCG/ACT nasal spray Place 2 sprays into both nostrils daily.   furosemide (LASIX) 40 MG tablet Take  1 tablet (40 mg total) by mouth daily.   HYDROcodone-acetaminophen (NORCO/VICODIN) 5-325 MG tablet Take 1 tablet by mouth every 6 (six) hours as needed.   loratadine (CLARITIN) 10 MG tablet Take 1 tablet (10 mg total) by mouth daily.   lovastatin (MEVACOR) 20 MG tablet TAKE ONE TABLET BY MOUTH EVERY DAY AT 6:00PM   Magnesium 500 MG CAPS Take 1 mg by mouth 2 (two) times daily.     nitroGLYCERIN (NITROSTAT) 0.4 MG SL tablet Place 1 tablet (0.4 mg total) under the tongue every 5 (five) minutes as needed.   pantoprazole (PROTONIX) 40 MG tablet TAKE 1 TABLET BY MOUTH 30 MINUTES PRIOR TO BREAKFAST AND SUPPER   pregabalin (LYRICA) 75 MG capsule Take 1 capsule (75 mg total) by mouth 2 (two) times daily. For nerve pain   Semaglutide,0.25 or 0.'5MG'$ /DOS, (OZEMPIC, 0.25 OR 0.5 MG/DOSE,) 2 MG/1.5ML SOPN Inject 0.25 mg into the skin once a week for 28 days, THEN 0.5 mg once a week.   SYNTHROID 175 MCG tablet TAKE ONE TABLET BY MOUTH EVERY DAY BEFORE BREAKFAST.   Thiamine HCl (THIAMINE PO) Take 1 tablet by mouth daily.    No facility-administered encounter medications on file as of 01/06/2021.    Allergies (verified) Aspirin, Trazodone and nefazodone, Flagyl [metronidazole], and Morphine and related   History: Past Medical History:  Diagnosis Date   Allergy    ANEMIA, IRON DEFICIENCY 05/08/2009   Angina    ASYMPTOMATIC POSTMENOPAUSAL STATUS 10/11/2008   Blood transfusion    Blood transfusion without reported diagnosis    Breast cancer ([place]) 09/29/11   invasive grade III ductal ca,assoc high grade dcis,ER/PR=neg   C. difficile colitis    Cataract    Diverticulosis of colon (without mention of hemorrhage)    Esophageal reflux 06/12/2008   Gastroparesis    GOITER, MULTINODULAR 04/02/2009   Gout, unspecified    H/O hiatal hernia    History of lower GI bleeding    History of radiation therapy 02/08/12-03/25/12   left breast,total 61gy   Hypokalemia 05/11/2013   Hypomagnesemia    HYPOTHYROIDISM, POST-RADIATION 08/13/2009   Internal hemorrhoids without mention of complication    Kidney stones    "several"   Leukopenia    Migraines    Obesity    Osteoarthrosis, unspecified whether generalized or localized, unspecified site    Other and unspecified hyperlipidemia    Personal history of chemotherapy 2013   Personal history of radiation therapy 2013   left   PONV (postoperative  nausea and vomiting)    PUD (peptic ulcer disease)    Short bowel syndrome    Shortness of breath on exertion    "sometimes"   Stricture and stenosis of esophagus    Thyrotoxicosis without mention of goiter or other cause, without mention of thyrotoxic crisis or storm    Type II or unspecified type diabetes mellitus without mention of complication, not stated as uncontrolled    no med in years diet controled   Unspecified essential hypertension    UTI (urinary tract infection)    Varicose veins    VITAMIN B12 DEFICIENCY 08/30/2009   Past Surgical History:  Procedure Laterality Date   ABDOMINAL ADHESION SURGERY  1980's thru 1990's   "several"   ABDOMINAL HYSTERECTOMY  1970's   with BSO   BREAST BIOPSY Left 08/13/11   left breast lower inner quadrant   BREAST BIOPSY Right 1985   Rt exc bx, benign   BREAST LUMPECTOMY Left 08/2011   BREAST LUMPECTOMY  W/ NEEDLE LOCALIZATION  09/29/11   left  breast=lymph node,excision benign/ ER/PR=neg, her 2 Positive   BUNIONECTOMY  1970's   bilateral   CHOLECYSTECTOMY  1990's   COLON SURGERY     "several surgeries for short bowel syndrome"   COLONOSCOPY  2012   multiple    DILATION AND CURETTAGE OF UTERUS     ESOPHAGOGASTRODUODENOSCOPY  2011   multiple    EXCISIONAL HEMORRHOIDECTOMY  11/10/2016   EYE SURGERY     "long time ago"   FLEXIBLE SIGMOIDOSCOPY  2011   multiple    KIDNEY STONE SURGERY  1990's   "tried to go up & get it but pushed it further up"   LITHOTRIPSY     "4 or 5 times"   MASTECTOMY W/ NODES PARTIAL  09/29/11   left   PORT-A-CATH REMOVAL Right 12/19/2013   Procedure: MINOR REMOVAL PORT-A-CATH;  Surgeon: Adin Hector, MD;  Location: [place];  Service: General;  Laterality: Right;   PORTACATH PLACEMENT  09/29/2011   Procedure: INSERTION PORT-A-CATH;  Surgeon: Adin Hector, MD;  Location: [place];  Service: General;  Laterality: N/A;   SMALL INTESTINE SURGERY     Thyroid Ultrasound  12/1994 and 12/1995    TOTAL KNEE ARTHROPLASTY Right 06/05/2015   Procedure: RIGHT TOTAL KNEE ARTHROPLASTY;  Surgeon: Ninetta Lights, MD;  Location: [place];  Service: Orthopedics;  Laterality: Right;   UPPER GASTROINTESTINAL ENDOSCOPY     VEIN LIGATION AND STRIPPING  1980's   Right leg   Family History  Problem Relation Age of Onset   Esophageal cancer Son        deceased   Breast cancer Son        esophageal   Diabetes Mother    Heart disease Mother    Kidney disease Sister    Diabetes Father    Hypertension Father    Kidney disease Brother        x 3   Colon cancer Paternal Uncle    Breast cancer Maternal Aunt    Breast cancer Paternal Aunt    Breast cancer Cousin        Pt states she has 15+ cousins w/ Breast CA   Rectal cancer Neg Hx    Stomach cancer Neg Hx    Social History   Socioeconomic History   Marital status: Widowed    Spouse name: Not on file   Number of children: 2   Years of education: 10   Highest education level: 10th grade  Occupational History   Occupation: RETIRED  Tobacco Use   Smoking status: Former    Packs/day: 1.00    Years: 10.00    Pack years: 10.00    Types: Cigarettes    Quit date: 09/22/1985    Years since quitting: 35.3   Smokeless tobacco: Never  Vaping Use   Vaping Use: Never used  Substance and Sexual Activity   Alcohol use: No    Comment: 09/29/11 "used to drink socially years ago"   Drug use: No   Sexual activity: Yes    Comment: 1st intercourse- 15, partners- 52, boyfriend- 1.8 yrs  Other Topics Concern   Not on file  Social History Narrative   Not on file   Social Determinants of Health   Financial Resource Strain: High Risk   Difficulty of Paying Living Expenses: Hard  Food Insecurity: No Food Insecurity   Worried About Running Out of Food in the Last Year: Never true  Ran Out of Food in the Last Year: Never true  Transportation Needs: No Transportation Needs   Lack of Transportation (Medical): No   Lack of Transportation (Non-Medical):  No  Physical Activity: Inactive   Days of Exercise per Week: 0 days   Minutes of Exercise per Session: 0 min  Stress: No Stress Concern Present   Feeling of Stress : Not at all  Social Connections: Moderately Integrated   Frequency of Communication with Friends and Family: More than three times a week   Frequency of Social Gatherings with Friends and Family: More than three times a week   Attends Religious Services: More than 4 times per year   Active Member of [employer] or Organizations: Yes   Attends Archivist Meetings: More than 4 times per year   Marital Status: Widowed    Tobacco Counseling Counseling given: Not Answered   Clinical Intake:  Pre-visit preparation completed: Yes  Pain : No/denies pain     Nutritional Risks: None Diabetes: No  How often do you need to have someone help you when you read instructions, pamphlets, or other written materials from your doctor or pharmacy?: 1 - Never What is the last grade level you completed in school?: 10th grade  Diabetic? yes  Interpreter Needed?: No  Information entered by :: Lisette Abu, LPN   Activities of Daily Living In your present state of health, do you have any difficulty performing the following activities: 01/06/2021 12/13/2020  Hearing? N N  Vision? N N  Difficulty concentrating or making decisions? N N  Walking or climbing stairs? N N  Dressing or bathing? N N  Doing errands, shopping? N N  Preparing Food and eating ? N -  Using the Toilet? N -  In the past six months, have you accidently leaked urine? Y -  Comment Patient wears Depends for protection. -  Do you have problems with loss of bowel control? N -  Managing your Medications? N -  Managing your Finances? N -  Housekeeping or managing your Housekeeping? N -  Some recent data might be hidden    Patient Care Team: Hoyt Koch, MD as PCP - General (Internal Medicine) Elouise Munroe, MD as PCP - Cardiology  (Cardiology) Sable Feil, MD as Attending Physician (Gastroenterology) Marcy Panning, MD (Hematology and Oncology) Fanny Skates, MD as Attending Physician (General Surgery) Jesusita Oka, RN as Registered Nurse Amada Kingfisher, MD as Consulting Physician (Hematology and Oncology) Sharyne Peach, MD as Consulting Physician (Ophthalmology) Elmarie Shiley, MD as Consulting Physician (Nephrology) Warden Fillers, MD as Consulting Physician (Ophthalmology)  Indicate any recent Medical Services you may have received from other than [facility] providers in the past year (date may be approximate).     Assessment:   This is a routine wellness examination for Saron.  Hearing/Vision screen Hearing Screening - Comments:: Patient declined any hearing difficulty. Vision Screening - Comments:: Patient wears glasses.  Eye exam done annually by Dr. Warden Fillers.  Dietary issues and exercise activities discussed: Current Exercise Habits: The patient does not participate in regular exercise at present   Goals Addressed               This Visit's Progress     Patient Stated (pt-stated)        Continue to be independent.      Depression Screen PHQ 2/9 Scores 01/06/2021 12/13/2020 11/14/2015 11/13/2014  PHQ - 2 Score 0 0 0 0    Fall Risk Fall  Risk  01/06/2021 12/13/2020 03/18/2018 11/14/2015 11/13/2014  Falls in the past year? 0 0 No No No  Number falls in past yr: 0 0 - - -  Injury with Fall? 0 0 - - -  Risk for fall due to : No Fall Risks - - - -  Follow up Falls evaluation completed - - - -    FALL RISK PREVENTION PERTAINING TO THE HOME:  Any stairs in or around the home? No  If so, are there any without handrails? No  Home free of loose throw rugs in walkways, pet beds, electrical cords, etc? Yes  Adequate lighting in your home to reduce risk of falls? Yes   ASSISTIVE DEVICES UTILIZED TO PREVENT FALLS:  Life alert? No  Use of a cane, walker or w/c? Yes  Grab bars in the  bathroom? Yes  Shower chair or bench in shower? Yes  Elevated toilet seat or a handicapped toilet? Yes   TIMED UP AND GO:  Was the test performed? No .  Length of time to ambulate 10 feet: n/a sec.   Gait steady and fast with assistive device (patient stated that she uses a cane for assistance).  Cognitive Function: No flowsheet data found.         Immunizations Immunization History  Administered Date(s) Administered   Fluad Quad(high Dose 65+) 02/07/2019, 02/12/2020   Influenza Split 04/15/2011, 03/23/2012   Influenza Whole 05/15/2010   Influenza, High Dose Seasonal PF 03/31/2016, 03/15/2018   Influenza,inj,Quad PF,6+ Mos 02/23/2013, 02/01/2014, 03/29/2015, 06/28/2017   Influenza-Unspecified 02/13/2020   PFIZER(Purple Top)SARS-COV-2 Vaccination 06/23/2019, 07/14/2019, 03/03/2020   Pneumococcal Conjugate-13 05/01/2016   Pneumococcal Polysaccharide-23 05/15/2010   Td 01/31/2003   Tdap 06/28/2017   Zoster, Live 06/02/2011    TDAP status: Up to date  Flu Vaccine status: Up to date  Pneumococcal vaccine status: Up to date  Covid-19 vaccine status: Completed vaccines  Qualifies for Shingles Vaccine? Yes   Zostavax completed Yes   Shingrix Completed?: No.    Education has been provided regarding the importance of this vaccine. Patient has been advised to call insurance company to determine out of pocket expense if they have not yet received this vaccine. Advised may also receive vaccine at local pharmacy or Health Dept. Verbalized acceptance and understanding.  Screening Tests Health Maintenance  Topic Date Due   Zoster Vaccines- Shingrix (1 of 2) Never done   OPHTHALMOLOGY EXAM  02/11/2019   COVID-19 Vaccine (4 - Booster for Pfizer series) 06/03/2020   INFLUENZA VACCINE  12/30/2020   HEMOGLOBIN A1C  02/20/2021   FOOT EXAM  12/13/2021   URINE MICROALBUMIN  12/13/2021   TETANUS/TDAP  06/29/2027   DEXA SCAN  Completed   Hepatitis C Screening  Completed   PNA vac Low  Risk Adult  Completed   HPV VACCINES  Aged Out    Health Maintenance  Health Maintenance Due  Topic Date Due   Zoster Vaccines- Shingrix (1 of 2) Never done   OPHTHALMOLOGY EXAM  02/11/2019   COVID-19 Vaccine (4 - Booster for Pfizer series) 06/03/2020   INFLUENZA VACCINE  12/30/2020    Colorectal cancer screening: No longer required.   Mammogram status: Completed 11/22/2020. Repeat every year  Bone Density status: Completed 05/28/2020. Results reflect: Bone density results: NORMAL. Repeat every 2-5 years.  Lung Cancer Screening: (Low Dose CT Chest recommended if Age 33-80 years, 30 pack-year currently smoking OR have quit w/in 15years.) does not qualify.   Lung Cancer Screening Referral: no  Additional Screening:  Hepatitis C Screening: does qualify; Completed yes  Vision Screening: Recommended annual ophthalmology exams for early detection of glaucoma and other disorders of the eye. Is the patient up to date with their annual eye exam?  Yes  Who is the provider or what is the name of the office in which the patient attends annual eye exams? Warden Fillers, MD. If pt is not established with a provider, would they like to be referred to a provider to establish care? No .   Dental Screening: Recommended annual dental exams for proper oral hygiene  Community Resource Referral / Chronic Care Management: CRR required this visit?  Yes  (Patient needs assistance with purchasing Depends).  CCM required this visit?  No      Plan:     I have personally reviewed and noted the following in the patient's chart:   Medical and social history Use of alcohol, tobacco or illicit drugs  Current medications and supplements including opioid prescriptions.  Functional ability and status Nutritional status Physical activity Advanced directives List of other physicians Hospitalizations, surgeries, and ER visits in previous 12 months Vitals Screenings to include cognitive,  depression, and falls Referrals and appointments  In addition, I have reviewed and discussed with patient certain preventive protocols, quality metrics, and best practice recommendations. A written personalized care plan for preventive services as well as general preventive health recommendations were provided to patient.     Sheral Flow, LPN   [id number]   Nurse Notes:  Patient is cogitatively intact. There were no vitals filed for this visit. There is no height or weight on file to calculate BMI.

## 2021-01-06 NOTE — Patient Instructions (Addendum)
[name] , Thank you for taking time to come for your Medicare Wellness Visit. I appreciate your ongoing commitment to your health goals. Please review the following plan we discussed and let me know if I can assist you in the future.   Screening recommendations/referrals: Colonoscopy: last done 05/12/2019; no longer recommended due to age. Mammogram: last done 11/22/2020; due every year Bone Density: last done 05/28/2020; results normal Recommended yearly ophthalmology/optometry visit for glaucoma screening and checkup Recommended yearly dental visit for hygiene and checkup  Vaccinations: Influenza vaccine: 02/13/2020; due Fall 2022 Pneumococcal vaccine: 05/15/2010, 05/01/2016 Tdap vaccine: 06/28/2017; due every 10 years  Shingles vaccine: Please call your insurance company to determine your out of pocket expense for the Shingrix vaccine. You may receive this vaccine at your local pharmacy.   Covid-19:06/03/2019, 07/14/2019, 03/03/2020  Advanced directives: Please bring a copy of your health care power of attorney and living will to the office at your convenience.  Conditions/risks identified: Yes; Client understands the importance of follow-up with providers by attending scheduled visits and discussed goals to eat healthier, increase physical activity, exercise the brain, socialize more, get enough sleep and make time for laughter.  Next appointment: Please schedule your next Medicare Wellness Visit with your Nurse Health Advisor in 1 year by calling [phone].   Preventive Care 34 Years and Older, Female Preventive care refers to lifestyle choices and visits with your health care provider that can promote health and wellness. What does preventive care include? A yearly physical exam. This is also called an annual well check. Dental exams once or twice a year. Routine eye exams. Ask your health care provider how often you should have your eyes checked. Personal lifestyle choices,  including: Daily care of your teeth and gums. Regular physical activity. Eating a healthy diet. Avoiding tobacco and drug use. Limiting alcohol use. Practicing safe sex. Taking low-dose aspirin every day. Taking vitamin and mineral supplements as recommended by your health care provider. What happens during an annual well check? The services and screenings done by your health care provider during your annual well check will depend on your age, overall health, lifestyle risk factors, and family history of disease. Counseling  Your health care provider may ask you questions about your: Alcohol use. Tobacco use. Drug use. Emotional well-being. Home and relationship well-being. Sexual activity. Eating habits. History of falls. Memory and ability to understand (cognition). Work and work Statistician. Reproductive health. Screening  You may have the following tests or measurements: Height, weight, and BMI. Blood pressure. Lipid and cholesterol levels. These may be checked every 5 years, or more frequently if you are over 19 years old. Skin check. Lung cancer screening. You may have this screening every year starting at age 53 if you have a 30-pack-year history of smoking and currently smoke or have quit within the past 15 years. Fecal occult blood test (FOBT) of the stool. You may have this test every year starting at age 3. Flexible sigmoidoscopy or colonoscopy. You may have a sigmoidoscopy every 5 years or a colonoscopy every 10 years starting at age 48. Hepatitis C blood test. Hepatitis B blood test. Sexually transmitted disease (STD) testing. Diabetes screening. This is done by checking your blood sugar (glucose) after you have not eaten for a while (fasting). You may have this done every 1-3 years. Bone density scan. This is done to screen for osteoporosis. You may have this done starting at age 79. Mammogram. This may be done every 1-2 years. Talk to  your health care provider  about how often you should have regular mammograms. Talk with your health care provider about your test results, treatment options, and if necessary, the need for more tests. Vaccines  Your health care provider may recommend certain vaccines, such as: Influenza vaccine. This is recommended every year. Tetanus, diphtheria, and acellular pertussis (Tdap, Td) vaccine. You may need a Td booster every 10 years. Zoster vaccine. You may need this after age 25. Pneumococcal 13-valent conjugate (PCV13) vaccine. One dose is recommended after age 21. Pneumococcal polysaccharide (PPSV23) vaccine. One dose is recommended after age 30. Talk to your health care provider about which screenings and vaccines you need and how often you need them. This information is not intended to replace advice given to you by your health care provider. Make sure you discuss any questions you have with your health care provider. Document Released: 06/14/2015 Document Revised: 02/05/2016 Document Reviewed: 03/19/2015 Elsevier Interactive Patient Education  2017 [place] Prevention in the Home Falls can cause injuries. They can happen to people of all ages. There are many things you can do to make your home safe and to help prevent falls. What can I do on the outside of my home? Regularly fix the edges of walkways and driveways and fix any cracks. Remove anything that might make you trip as you walk through a door, such as a raised step or threshold. Trim any bushes or trees on the path to your home. Use bright outdoor lighting. Clear any walking paths of anything that might make someone trip, such as rocks or tools. Regularly check to see if handrails are loose or broken. Make sure that both sides of any steps have handrails. Any raised decks and porches should have guardrails on the edges. Have any leaves, snow, or ice cleared regularly. Use sand or salt on walking paths during winter. Clean up any spills in  your garage right away. This includes oil or grease spills. What can I do in the bathroom? Use night lights. Install grab bars by the toilet and in the tub and shower. Do not use towel bars as grab bars. Use non-skid mats or decals in the tub or shower. If you need to sit down in the shower, use a plastic, non-slip stool. Keep the floor dry. Clean up any water that spills on the floor as soon as it happens. Remove soap buildup in the tub or shower regularly. Attach bath mats securely with double-sided non-slip rug tape. Do not have throw rugs and other things on the floor that can make you trip. What can I do in the bedroom? Use night lights. Make sure that you have a light by your bed that is easy to reach. Do not use any sheets or blankets that are too big for your bed. They should not hang down onto the floor. Have a firm chair that has side arms. You can use this for support while you get dressed. Do not have throw rugs and other things on the floor that can make you trip. What can I do in the kitchen? Clean up any spills right away. Avoid walking on wet floors. Keep items that you use a lot in easy-to-reach places. If you need to reach something above you, use a strong step stool that has a grab bar. Keep electrical cords out of the way. Do not use floor polish or wax that makes floors slippery. If you must use wax, use non-skid floor wax. Do not  have throw rugs and other things on the floor that can make you trip. What can I do with my stairs? Do not leave any items on the stairs. Make sure that there are handrails on both sides of the stairs and use them. Fix handrails that are broken or loose. Make sure that handrails are as long as the stairways. Check any carpeting to make sure that it is firmly attached to the stairs. Fix any carpet that is loose or worn. Avoid having throw rugs at the top or bottom of the stairs. If you do have throw rugs, attach them to the floor with carpet  tape. Make sure that you have a light switch at the top of the stairs and the bottom of the stairs. If you do not have them, ask someone to add them for you. What else can I do to help prevent falls? Wear shoes that: Do not have high heels. Have rubber bottoms. Are comfortable and fit you well. Are closed at the toe. Do not wear sandals. If you use a stepladder: Make sure that it is fully opened. Do not climb a closed stepladder. Make sure that both sides of the stepladder are locked into place. Ask someone to hold it for you, if possible. Clearly mark and make sure that you can see: Any grab bars or handrails. First and last steps. Where the edge of each step is. Use tools that help you move around (mobility aids) if they are needed. These include: Canes. Walkers. Scooters. Crutches. Turn on the lights when you go into a dark area. Replace any light bulbs as soon as they burn out. Set up your furniture so you have a clear path. Avoid moving your furniture around. If any of your floors are uneven, fix them. If there are any pets around you, be aware of where they are. Review your medicines with your doctor. Some medicines can make you feel dizzy. This can increase your chance of falling. Ask your doctor what other things that you can do to help prevent falls. This information is not intended to replace advice given to you by your health care provider. Make sure you discuss any questions you have with your health care provider. Document Released: 03/14/2009 Document Revised: 10/24/2015 Document Reviewed: 06/22/2014 Elsevier Interactive Patient Education  2017 [employer].

## 2021-01-06 NOTE — Patient Instructions (Addendum)
Order- DME ([facility])  new compressor nebulizer                                   Duoneb neb solution    # 360 mls   inhale 1 neb, twice daily as needed                                               Refill x 12  Refill script was sent for Albuterol rescue inhaler to [facility]    dx Asthma moderate persistent

## 2021-01-07 ENCOUNTER — Encounter: Payer: Self-pay | Admitting: *Deleted

## 2021-01-08 ENCOUNTER — Ambulatory Visit: Payer: Medicare Other | Admitting: Internal Medicine

## 2021-01-09 ENCOUNTER — Ambulatory Visit (INDEPENDENT_AMBULATORY_CARE_PROVIDER_SITE_OTHER): Payer: Medicare Other | Admitting: Internal Medicine

## 2021-01-09 ENCOUNTER — Other Ambulatory Visit: Payer: Self-pay

## 2021-01-09 ENCOUNTER — Encounter: Payer: Self-pay | Admitting: Internal Medicine

## 2021-01-09 DIAGNOSIS — D500 Iron deficiency anemia secondary to blood loss (chronic): Secondary | ICD-10-CM | POA: Diagnosis not present

## 2021-01-09 MED ORDER — LORATADINE 10 MG PO TABS: 10.0000 mg | ORAL_TABLET | Freq: Every day | ORAL | 3 refills | Status: DC

## 2021-01-09 NOTE — Progress Notes (Signed)
   Subjective:   Patient ID: [name], female    DOB: May 26, 1946, 75 y.o.   MRN: [id number]  HPI The patient is a 75 YO female coming in for concerns about recent labs.   Review of Systems  Constitutional: Negative.   HENT: Negative.    Eyes: Negative.   Respiratory:  Negative for cough, chest tightness and shortness of breath.   Cardiovascular:  Negative for chest pain, palpitations and leg swelling.  Gastrointestinal:  Negative for abdominal distention, abdominal pain, constipation, diarrhea, nausea and vomiting.  Musculoskeletal: Negative.   Skin: Negative.   Neurological: Negative.   Psychiatric/Behavioral: Negative.     Objective:  Physical Exam Constitutional:      Appearance: She is well-developed.  HENT:     Head: Normocephalic and atraumatic.  Cardiovascular:     Rate and Rhythm: Normal rate and regular rhythm.  Pulmonary:     Effort: Pulmonary effort is normal. No respiratory distress.     Breath sounds: Normal breath sounds. No wheezing or rales.  Abdominal:     General: Bowel sounds are normal. There is no distension.     Palpations: Abdomen is soft.     Tenderness: There is no abdominal tenderness. There is no rebound.  Musculoskeletal:     Cervical back: Normal range of motion.  Skin:    General: Skin is warm and dry.  Neurological:     Mental Status: She is alert and oriented to person, place, and time.     Coordination: Coordination normal.    Vitals:   01/09/21 1320  BP: 130/80  Pulse: 82  Resp: 18  Temp: 97.6 F (36.4 C)  TempSrc: Oral  SpO2: 95%  Weight: 229 lb 12.8 oz (104.2 kg)  Height: '5\' 5"'$  (1.651 m)    This visit occurred during the SARS-CoV-2 public health emergency.  Safety protocols were in place, including screening questions prior to the visit, additional usage of staff PPE, and extensive cleaning of exam room while observing appropriate contact time as indicated for disinfecting solutions.   Assessment & Plan:  Visit  time 25 minutes in face to face communication with patient and coordination of care, additional 10 minutes spent in record review, coordination or care, ordering tests, communicating/referring to other healthcare professionals, documenting in medical records all on the same day of the visit for total time 35 minutes spent on the visit.

## 2021-01-09 NOTE — Patient Instructions (Signed)
We do not need any labs today.   

## 2021-01-10 ENCOUNTER — Encounter: Payer: Self-pay | Admitting: Internal Medicine

## 2021-01-10 DIAGNOSIS — J45909 Unspecified asthma, uncomplicated: Secondary | ICD-10-CM | POA: Insufficient documentation

## 2021-01-10 NOTE — Assessment & Plan Note (Signed)
Continues to benefit from sleep O2

## 2021-01-10 NOTE — Assessment & Plan Note (Addendum)
Orthopnea and pedal edema suggest this may be fluid. Plan- trial of compressor nebulizer with Duoneb. I think she will be better able to use and to remember this. CXR

## 2021-01-10 NOTE — Assessment & Plan Note (Signed)
As this was not explained well by GI counseled about iron stores and deficiency and her prior diverticulosis which could be causing her chronic low blood counts. She does not have another source of the bleeding. No renal disease.

## 2021-01-13 ENCOUNTER — Other Ambulatory Visit: Payer: Self-pay | Admitting: Family

## 2021-01-15 DIAGNOSIS — Z03818 Encounter for observation for suspected exposure to other biological agents ruled out: Secondary | ICD-10-CM | POA: Diagnosis not present

## 2021-02-03 DIAGNOSIS — Z20822 Contact with and (suspected) exposure to covid-19: Secondary | ICD-10-CM | POA: Diagnosis not present

## 2021-02-13 ENCOUNTER — Encounter: Payer: Self-pay | Admitting: Internal Medicine

## 2021-02-13 ENCOUNTER — Other Ambulatory Visit: Payer: Self-pay

## 2021-02-13 ENCOUNTER — Ambulatory Visit (INDEPENDENT_AMBULATORY_CARE_PROVIDER_SITE_OTHER): Payer: Medicare Other | Admitting: Internal Medicine

## 2021-02-13 VITALS — BP 124/80 | HR 68 | Temp 97.8°F | Resp 18 | Ht 65.0 in | Wt 216.4 lb

## 2021-02-13 DIAGNOSIS — C50912 Malignant neoplasm of unspecified site of left female breast: Secondary | ICD-10-CM | POA: Diagnosis not present

## 2021-02-13 DIAGNOSIS — Z23 Encounter for immunization: Secondary | ICD-10-CM

## 2021-02-13 NOTE — Patient Instructions (Addendum)
We will increase the dose of the ozempic to 0.5 mg weekly.   You can get the shingles vaccine at the pharmacy.   You can get the covid-19 booster at the pharmacy.

## 2021-02-13 NOTE — Progress Notes (Signed)
   Subjective:   Patient ID: [name], female    DOB: 11-Jun-1945, 75 y.o.   MRN: [id number]  HPI The patient is a 75 YO female coming in for follow up.   Review of Systems  Constitutional: Negative.   HENT: Negative.    Eyes: Negative.   Respiratory:  Negative for cough, chest tightness and shortness of breath.   Cardiovascular:  Negative for chest pain, palpitations and leg swelling.  Gastrointestinal:  Negative for abdominal distention, abdominal pain, constipation, diarrhea, nausea and vomiting.  Musculoskeletal: Negative.   Skin: Negative.   Neurological: Negative.   Psychiatric/Behavioral: Negative.     Objective:  Physical Exam Constitutional:      Appearance: She is well-developed.  HENT:     Head: Normocephalic and atraumatic.  Cardiovascular:     Rate and Rhythm: Normal rate and regular rhythm.     Comments: Asymmetry to the breasts Pulmonary:     Effort: Pulmonary effort is normal. No respiratory distress.     Breath sounds: Normal breath sounds. No wheezing or rales.  Abdominal:     General: Bowel sounds are normal. There is no distension.     Palpations: Abdomen is soft.     Tenderness: There is no abdominal tenderness. There is no rebound.  Musculoskeletal:     Cervical back: Normal range of motion.  Skin:    General: Skin is warm and dry.  Neurological:     Mental Status: She is alert and oriented to person, place, and time.     Coordination: Coordination normal.    Vitals:   02/13/21 0958  BP: 124/80  Pulse: 68  Resp: 18  Temp: 97.8 F (36.6 C)  TempSrc: Oral  SpO2: 93%  Weight: 216 lb 6.4 oz (98.2 kg)  Height: '5\' 5"'$  (1.651 m)    This visit occurred during the SARS-CoV-2 public health emergency.  Safety protocols were in place, including screening questions prior to the visit, additional usage of staff PPE, and extensive cleaning of exam room while observing appropriate contact time as indicated for disinfecting solutions.    Assessment & Plan:  Flu shot given at visit

## 2021-02-14 NOTE — Assessment & Plan Note (Signed)
Not satisfied with appearance of the breast symmetry so referral to plastic surgery to address options.

## 2021-02-14 NOTE — Assessment & Plan Note (Signed)
She has started ozempic about 2 months ago and at 0.25 mg weekly she has lost about 12 pounds. She wishes to increase to 0.5 mg weekly which we have done. Will see her back in 2-3 months. She wishes to continue with weight loss.

## 2021-02-20 ENCOUNTER — Ambulatory Visit: Payer: Medicare Other | Admitting: Endocrinology

## 2021-02-21 ENCOUNTER — Ambulatory Visit: Payer: Medicare Other | Admitting: Gastroenterology

## 2021-02-27 ENCOUNTER — Other Ambulatory Visit: Payer: Self-pay | Admitting: Gastroenterology

## 2021-02-27 ENCOUNTER — Telehealth: Payer: Self-pay

## 2021-02-27 ENCOUNTER — Ambulatory Visit (INDEPENDENT_AMBULATORY_CARE_PROVIDER_SITE_OTHER): Payer: Medicare Other | Admitting: Endocrinology

## 2021-02-27 ENCOUNTER — Other Ambulatory Visit: Payer: Self-pay

## 2021-02-27 VITALS — BP 128/70 | HR 71 | Ht 65.0 in | Wt 222.4 lb

## 2021-02-27 DIAGNOSIS — E1169 Type 2 diabetes mellitus with other specified complication: Secondary | ICD-10-CM | POA: Diagnosis not present

## 2021-02-27 DIAGNOSIS — E890 Postprocedural hypothyroidism: Secondary | ICD-10-CM

## 2021-02-27 LAB — TSH: TSH: 0.32 u[IU]/mL — ABNORMAL LOW (ref 0.35–5.50)

## 2021-02-27 LAB — POCT GLYCOSYLATED HEMOGLOBIN (HGB A1C): Hemoglobin A1C: 5.6 % (ref 4.0–5.6)

## 2021-02-27 LAB — T4, FREE: Free T4: 1.25 ng/dL (ref 0.60–1.60)

## 2021-02-27 NOTE — Telephone Encounter (Signed)
-----   Message from Renato Shin, MD sent at 02/27/2021  3:27 PM EDT ----- please contact patient: The levothyroxine is a tiny bit too much.  However, the blood test has varied, so I favor continuing the same for now.  I'll see you next time.

## 2021-02-27 NOTE — Progress Notes (Signed)
Subjective:    Patient ID: [name], female    DOB: 11/28/1945, 75 y.o.   MRN: [id number]  HPI Pt returns for f/u of diabetes mellitus: DM type: 2 Dx'ed: 4034 Complications: CRI Therapy: no medication recently GDM: never DKA: never Severe hypoglycemia: never Pancreatitis: never Pancreatic imaging: normal on 2020 CT.  SDOH: none Other: she has never been on insulin. Interval history: none Pt also has MNG (dx'ed 2004: when she developed hyperthyroidism in 2010, she had RAI rx; she has been on synthroid since then; f/u US in 2022 was unchanged; in 2021, pt requested bx of the left lobe nodule--beth cat 1).  She takes synthroid as rx'ed.   Past Medical History:  Diagnosis Date   Allergy    ANEMIA, IRON DEFICIENCY 05/08/2009   Angina    ASYMPTOMATIC POSTMENOPAUSAL STATUS 10/11/2008   Blood transfusion    Blood transfusion without reported diagnosis    Breast cancer ([place]) 09/29/11   invasive grade III ductal ca,assoc high grade dcis,ER/PR=neg   C. difficile colitis    Cataract    Diverticulosis of colon (without mention of hemorrhage)    Esophageal reflux 06/12/2008   Gastroparesis    GOITER, MULTINODULAR 04/02/2009   Gout, unspecified    H/O hiatal hernia    History of lower GI bleeding    History of radiation therapy 02/08/12-03/25/12   left breast,total 61gy   Hypokalemia 05/11/2013   Hypomagnesemia    HYPOTHYROIDISM, POST-RADIATION 08/13/2009   Internal hemorrhoids without mention of complication    Kidney stones    "several"   Leukopenia    Migraines    Obesity    Osteoarthrosis, unspecified whether generalized or localized, unspecified site    Other and unspecified hyperlipidemia    Personal history of chemotherapy 2013   Personal history of radiation therapy 2013   left   PONV (postoperative nausea and vomiting)    PUD (peptic ulcer disease)    Short bowel syndrome    Shortness of breath on exertion    "sometimes"   Stricture and stenosis of esophagus     Thyrotoxicosis without mention of goiter or other cause, without mention of thyrotoxic crisis or storm    Type II or unspecified type diabetes mellitus without mention of complication, not stated as uncontrolled    no med in years diet controled   Unspecified essential hypertension    UTI (urinary tract infection)    Varicose veins    VITAMIN B12 DEFICIENCY 08/30/2009    Past Surgical History:  Procedure Laterality Date   ABDOMINAL ADHESION SURGERY  1980's thru 1990's   "several"   ABDOMINAL HYSTERECTOMY  1970's   with BSO   BREAST BIOPSY Left 08/13/11   left breast lower inner quadrant   BREAST BIOPSY Right 1985   Rt exc bx, benign   BREAST LUMPECTOMY Left 08/2011   BREAST LUMPECTOMY W/ NEEDLE LOCALIZATION  09/29/11   left  breast=lymph node,excision benign/ ER/PR=neg, her 2 Positive   BUNIONECTOMY  1970's   bilateral   CHOLECYSTECTOMY  1990's   COLON SURGERY     "several surgeries for short bowel syndrome"   COLONOSCOPY  2012   multiple    DILATION AND CURETTAGE OF UTERUS     ESOPHAGOGASTRODUODENOSCOPY  2011   multiple    EXCISIONAL HEMORRHOIDECTOMY  11/10/2016   EYE SURGERY     "long time ago"   FLEXIBLE SIGMOIDOSCOPY  2011   multiple    KIDNEY STONE SURGERY  1990's   "tried  to go up & get it but pushed it further up"   LITHOTRIPSY     "4 or 5 times"   MASTECTOMY W/ NODES PARTIAL  09/29/11   left   PORT-A-CATH REMOVAL Right 12/19/2013   Procedure: MINOR REMOVAL PORT-A-CATH;  Surgeon: Adin Hector, MD;  Location: [facility];  Service: General;  Laterality: Right;   PORTACATH PLACEMENT  09/29/2011   Procedure: INSERTION PORT-A-CATH;  Surgeon: Adin Hector, MD;  Location: [facility];  Service: General;  Laterality: N/A;   SMALL INTESTINE SURGERY     Thyroid Ultrasound  12/1994 and 12/1995   TOTAL KNEE ARTHROPLASTY Right 06/05/2015   Procedure: RIGHT TOTAL KNEE ARTHROPLASTY;  Surgeon: Ninetta Lights, MD;  Location: [place];  Service: Orthopedics;  Laterality:  Right;   UPPER GASTROINTESTINAL ENDOSCOPY     VEIN LIGATION AND STRIPPING  1980's   Right leg    Social History   Socioeconomic History   Marital status: Widowed    Spouse name: Not on file   Number of children: 2   Years of education: 10   Highest education level: 10th grade  Occupational History   Occupation: RETIRED  Tobacco Use   Smoking status: Former    Packs/day: 1.00    Years: 10.00    Pack years: 10.00    Types: Cigarettes    Quit date: 09/22/1985    Years since quitting: 35.4   Smokeless tobacco: Never  Vaping Use   Vaping Use: Never used  Substance and Sexual Activity   Alcohol use: No    Comment: 09/29/11 "used to drink socially years ago"   Drug use: No   Sexual activity: Yes    Comment: 1st intercourse- 15, partners- 12, boyfriend- 1.8 yrs  Other Topics Concern   Not on file  Social History Narrative   Not on file   Social Determinants of Health   Financial Resource Strain: High Risk   Difficulty of Paying Living Expenses: Hard  Food Insecurity: No Food Insecurity   Worried About Charity fundraiser in the Last Year: Never true   [place] in the Last Year: Never true  Transportation Needs: No Transportation Needs   Lack of Transportation (Medical): No   Lack of Transportation (Non-Medical): No  Physical Activity: Inactive   Days of Exercise per Week: 0 days   Minutes of Exercise per Session: 0 min  Stress: No Stress Concern Present   Feeling of Stress : Not at all  Social Connections: Moderately Integrated   Frequency of Communication with Friends and Family: More than three times a week   Frequency of Social Gatherings with Friends and Family: More than three times a week   Attends Religious Services: More than 4 times per year   Active Member of [employer] or Organizations: Yes   Attends Archivist Meetings: More than 4 times per year   Marital Status: Widowed  Human resources officer Violence: Not At Risk   Fear of Current or Ex-Partner:  No   Emotionally Abused: No   Physically Abused: No   Sexually Abused: No    Current Outpatient Medications on File Prior to Visit  Medication Sig Dispense Refill   albuterol (VENTOLIN HFA) 108 (90 Base) MCG/ACT inhaler Inhale 2 puffs into the lungs every 6 (six) hours as needed for wheezing or shortness of breath. 54 g 4   allopurinol (ZYLOPRIM) 100 MG tablet TAKE ONE TABLET BY MOUTH EVERY DAY 90 tablet 3  amitriptyline (ELAVIL) 50 MG tablet TAKE ONE TABLET BY MOUTH EVERY DAY AT BEDTIME (USE CAUTION - MAY CAUSE DROWSINESS) 90 tablet 0   calcium carbonate (TUMS - DOSED IN MG ELEMENTAL CALCIUM) 500 MG chewable tablet Chew 1 tablet by mouth as needed for indigestion or heartburn.     Calcium Carbonate-Vitamin D (CALCIUM-VITAMIN D) 600-200 MG-UNIT CAPS Take 1 capsule by mouth daily.     Cyanocobalamin (VITAMIN B 12 PO) Take 1 tablet by mouth 2 (two) times daily.      dicyclomine (BENTYL) 20 MG tablet Take 1 tablet (20 mg total) by mouth 3 (three) times daily as needed for spasms. 360 tablet 3   erythromycin ophthalmic ointment Place into the left eye in the morning and at bedtime.     famotidine (PEPCID) 40 MG tablet Take 1 tablet (40 mg total) by mouth at bedtime. 90 tablet 3   ferrous sulfate 325 (65 FE) MG tablet Take 1 tablet (325 mg total) by mouth daily with breakfast.     fluticasone (FLONASE) 50 MCG/ACT nasal spray Place 2 sprays into both nostrils daily. 16 g 6   HYDROcodone-acetaminophen (NORCO/VICODIN) 5-325 MG tablet Take 1 tablet by mouth every 6 (six) hours as needed. 15 tablet 0   loratadine (CLARITIN) 10 MG tablet Take 1 tablet (10 mg total) by mouth daily. 90 tablet 3   lovastatin (MEVACOR) 20 MG tablet TAKE ONE TABLET BY MOUTH EVERY DAY AT 6:00PM 90 tablet 2   Magnesium 500 MG CAPS Take 1 mg by mouth 2 (two) times daily.      nitroGLYCERIN (NITROSTAT) 0.4 MG SL tablet Place 1 tablet (0.4 mg total) under the tongue every 5 (five) minutes as needed. 25 tablet 6   pantoprazole  (PROTONIX) 40 MG tablet TAKE 1 TABLET BY MOUTH 30 MINUTES PRIOR TO BREAKFAST AND SUPPER 180 tablet 1   pregabalin (LYRICA) 75 MG capsule Take 1 capsule (75 mg total) by mouth 2 (two) times daily. For nerve pain 180 capsule 3   Semaglutide,0.25 or 0.5MG /DOS, (OZEMPIC, 0.25 OR 0.5 MG/DOSE,) 2 MG/1.5ML SOPN Inject 0.25 mg into the skin once a week for 28 days, THEN 0.5 mg once a week. 4.5 mL 0   SYNTHROID 175 MCG tablet TAKE ONE TABLET BY MOUTH EVERY DAY BEFORE BREAKFAST. 90 tablet 1   Thiamine HCl (THIAMINE PO) Take 1 tablet by mouth daily.      furosemide (LASIX) 40 MG tablet Take 1 tablet (40 mg total) by mouth daily. 90 tablet 3   No current facility-administered medications on file prior to visit.    Allergies  Allergen Reactions   Aspirin Other (See Comments)    REACTION: Gi Intolerance/ Burning in stomach   Trazodone And Nefazodone Nausea And Vomiting    "sick"   Flagyl [Metronidazole] Rash   Morphine And Related Itching    Family History  Problem Relation Age of Onset   Esophageal cancer Son        deceased   Breast cancer Son        esophageal   Diabetes Mother    Heart disease Mother    Kidney disease Sister    Diabetes Father    Hypertension Father    Kidney disease Brother        x 3   Colon cancer Paternal Uncle    Breast cancer Maternal Aunt    Breast cancer Paternal Aunt    Breast cancer Cousin        Pt states she has  15+ cousins w/ Breast CA   Rectal cancer Neg Hx    Stomach cancer Neg Hx     BP 128/70 (BP Location: Right Arm, Patient Position: Sitting, Cuff Size: Large)   Pulse 71   Ht 5\' 5"  (1.651 m)   Wt 222 lb 6.4 oz (100.9 kg)   SpO2 97%   BMI 37.01 kg/m    Review of Systems     Objective:   Physical Exam GENERAL: no distress NECK: thyroid is slightly enlarged, but I cant tell details.    Lab Results  Component Value Date   TSH 4.49 08/20/2020   T4TOTAL 8.1 07/29/2007    Lab Results  Component Value Date   HGBA1C 5.6 02/27/2021       Assessment & Plan:  Type 2 DM: stable off rx Hypothyroidism: overcontrolled.   However, the blood test has varied, so I favor continuing the same synthroid for now.   Patient Instructions  Blood tests are requested for you today.  We'll let you know about the results.   No medication is needed for the diabetes now.   We should plan to recheck the ultrasound next year.   Please come back for a follow-up appointment in 6 months.

## 2021-02-27 NOTE — Patient Instructions (Addendum)
Blood tests are requested for you today.  We'll let you know about the results.   No medication is needed for the diabetes now.   We should plan to recheck the ultrasound next year.   Please come back for a follow-up appointment in 6 months.

## 2021-03-04 ENCOUNTER — Ambulatory Visit: Payer: Medicare Other | Admitting: Physician Assistant

## 2021-03-09 DIAGNOSIS — Z20822 Contact with and (suspected) exposure to covid-19: Secondary | ICD-10-CM | POA: Diagnosis not present

## 2021-03-18 ENCOUNTER — Encounter: Payer: Self-pay | Admitting: Gastroenterology

## 2021-03-18 ENCOUNTER — Ambulatory Visit (INDEPENDENT_AMBULATORY_CARE_PROVIDER_SITE_OTHER): Payer: Medicare Other | Admitting: Gastroenterology

## 2021-03-18 VITALS — BP 104/66 | HR 66 | Ht 65.0 in | Wt 217.6 lb

## 2021-03-18 DIAGNOSIS — R159 Full incontinence of feces: Secondary | ICD-10-CM | POA: Diagnosis not present

## 2021-03-18 DIAGNOSIS — R197 Diarrhea, unspecified: Secondary | ICD-10-CM | POA: Diagnosis not present

## 2021-03-18 DIAGNOSIS — K219 Gastro-esophageal reflux disease without esophagitis: Secondary | ICD-10-CM

## 2021-03-18 DIAGNOSIS — R152 Fecal urgency: Secondary | ICD-10-CM | POA: Diagnosis not present

## 2021-03-18 DIAGNOSIS — R110 Nausea: Secondary | ICD-10-CM

## 2021-03-18 MED ORDER — ONDANSETRON HCL 4 MG PO TABS: 4.0000 mg | ORAL_TABLET | Freq: Three times a day (TID) | ORAL | 2 refills | Status: DC | PRN

## 2021-03-18 NOTE — Progress Notes (Signed)
    History of Present Illness: This is a 75 year old female with GERD, nausea loose stools and intermittent fecal incontinence.  She states her reflux symptoms are under good control, as good as it has been.  She has intermittent episodes of nausea that are not necessarily related to reflux however she often wakes with nausea in the middle the night which is likely reflux.  She states her stools have been looser and she has intermittent problems with fecal incontinence.  Current Medications, Allergies, Past Medical History, Past Surgical History, Family History and Social History were reviewed in [employer] record.   Physical Exam: General: Well developed, well nourished, no acute distress Head: Normocephalic and atraumatic Eyes: Sclerae anicteric, EOMI Ears: Normal auditory acuity Mouth: Not examined, mask on during Covid-19 pandemic Lungs: Clear throughout to auscultation Heart: Regular rate and rhythm; no murmurs, rubs or bruits Abdomen: Soft, non tender and non distended. No masses, hepatosplenomegaly or hernias noted. Normal Bowel sounds Rectal: Not done Musculoskeletal: Symmetrical with no gross deformities  Pulses:  Normal pulses noted Extremities: No clubbing, cyanosis, edema or deformities noted Neurological: Alert oriented x 4, grossly nonfocal Psychological:  Alert and cooperative. Normal mood and affect   Assessment and Recommendations:  GERD with a history of an esophageal stricture.  History of hiatal hernia.  Nausea.  Continue Pantoprzole 40 mg po bid, famotidine 40 mg po hs. Closely follow antireflux measures long-term including elevation of the head of the bed. TUMS prn.  Begin Zofran 4 mg p.o. 3 times daily as needed nausea vomiting. Diarrhea, fecal incontinence. DC Mg.  If diarrhea persists obtain stool for infectious causes and fecal elastase.  Then add Imodium 1-2 p.o. twice daily as needed.  Begin Kegel exercises 5 times daily long-term. IDA.   Colonoscopy and EGD performed in December 2020 did not reveal source of blood loss.  Further treatment by PCP.

## 2021-03-18 NOTE — Patient Instructions (Signed)
We have sent the following medications to your pharmacy for you to pick up at your convenience: zofran.   You can take over the counter Tums for break through reflux symptoms.   Stop taking Magnesium to help with your diarrhea symptoms.   Please start Kegel exercises 5 times daily.   Kegel Exercises Kegel exercises can help strengthen your pelvic floor muscles. The pelvic floor is a group of muscles that support your rectum, small intestine, and bladder. In females, pelvic floor muscles also help support the womb (uterus). These muscles help you control the flow of urine and stool. Kegel exercises are painless and simple, and they do not require any equipment. Your provider may suggest Kegel exercises to: Improve bladder and bowel control. Improve sexual response. Improve weak pelvic floor muscles after surgery to remove the uterus (hysterectomy) or pregnancy (females). Improve weak pelvic floor muscles after prostate gland removal or surgery (males). Kegel exercises involve squeezing your pelvic floor muscles, which are the same muscles you squeeze when you try to stop the flow of urine or keep from passing gas. The exercises can be done while sitting, standing, or lying down, but it is best to vary your position. Exercises How to do Kegel exercises: Squeeze your pelvic floor muscles tight. You should feel a tight lift in your rectal area. If you are a female, you should also feel a tightness in your vaginal area. Keep your stomach, buttocks, and legs relaxed. Hold the muscles tight for up to 10 seconds. Breathe normally. Relax your muscles. Repeat as told by your health care provider. Repeat this exercise daily as told by your health care provider. Continue to do this exercise for at least 4-6 weeks, or for as long as told by your health care provider. You may be referred to a physical therapist who can help you learn more about how to do Kegel exercises. Depending on your condition, your  health care provider may recommend: Varying how long you squeeze your muscles. Doing several sets of exercises every day. Doing exercises for several weeks. Making Kegel exercises a part of your regular exercise routine. This information is not intended to replace advice given to you by your health care provider. Make sure you discuss any questions you have with your health care provider. Document Revised: 05/08/2020 Document Reviewed: 01/05/2018 Elsevier Patient Education  2022 [employer].  Due to recent changes in healthcare laws, you may see the results of your imaging and laboratory studies on MyChart before your provider has had a chance to review them.  We understand that in some cases there may be results that are confusing or concerning to you. Not all laboratory results come back in the same time frame and the provider may be waiting for multiple results in order to interpret others.  Please give [place] 48 hours in order for your provider to thoroughly review all the results before contacting the office for clarification of your results.   The [facility] providers would like to encourage you to use [facility] to communicate with providers for non-urgent requests or questions.  Due to long hold times on the telephone, sending your provider a message by [facility] may be a faster and more efficient way to get a response.  Please allow 48 business hours for a response.  Please remember that this is for non-urgent requests.   Thank you for choosing me and [facility].  Pricilla Riffle. Dagoberto Ligas., MD., Marval Regal

## 2021-03-27 ENCOUNTER — Ambulatory Visit (INDEPENDENT_AMBULATORY_CARE_PROVIDER_SITE_OTHER): Payer: Medicare Other | Admitting: Plastic Surgery

## 2021-03-27 ENCOUNTER — Encounter: Payer: Self-pay | Admitting: Plastic Surgery

## 2021-03-27 ENCOUNTER — Other Ambulatory Visit: Payer: Self-pay

## 2021-03-27 VITALS — BP 133/85 | HR 83 | Ht 65.0 in | Wt 217.0 lb

## 2021-03-27 DIAGNOSIS — N6489 Other specified disorders of breast: Secondary | ICD-10-CM

## 2021-03-27 DIAGNOSIS — C50312 Malignant neoplasm of lower-inner quadrant of left female breast: Secondary | ICD-10-CM | POA: Diagnosis not present

## 2021-03-27 NOTE — Progress Notes (Signed)
Referring Provider Hoyt Koch, MD [address] [place],   [address]   CC:  Chief Complaint  Patient presents with   consult      [name] is an 75 y.o. female.  HPI: Patient presents to discuss breast asymmetry.  She had a left-sided breast cancer that was found back in 2013.  She underwent breast conservation treatment with lumpectomy and radiation.  She is done well from the standpoint of her cancer but is bothered by the asymmetry in her breast.  Her right side is much larger.  She wants to see if anything can be done surgically to address that.  She has difficulty with bras and getting symmetry even in close.  She is a diabetic but this is well controlled.  She does not take any blood thinners.  She is up-to-date on her mammograms and most recent one in June was normal.  Allergies  Allergen Reactions   Aspirin Other (See Comments)    REACTION: Gi Intolerance/ Burning in stomach   Trazodone And Nefazodone Nausea And Vomiting    "sick"   Flagyl [Metronidazole] Rash   Morphine And Related Itching    Outpatient Encounter Medications as of 03/27/2021  Medication Sig Note   albuterol (VENTOLIN HFA) 108 (90 Base) MCG/ACT inhaler Inhale 2 puffs into the lungs every 6 (six) hours as needed for wheezing or shortness of breath.    allopurinol (ZYLOPRIM) 100 MG tablet TAKE ONE TABLET BY MOUTH EVERY DAY    amitriptyline (ELAVIL) 50 MG tablet TAKE ONE TABLET BY MOUTH EVERY DAY AT BEDTIME (USE CAUTION - MAY CAUSE DROWSINESS)    calcium carbonate (TUMS - DOSED IN MG ELEMENTAL CALCIUM) 500 MG chewable tablet Chew 1 tablet by mouth as needed for indigestion or heartburn.    Calcium Carbonate-Vitamin D (CALCIUM-VITAMIN D) 600-200 MG-UNIT CAPS Take 1 capsule by mouth daily.    Cyanocobalamin (VITAMIN B 12 PO) Take 1 tablet by mouth 2 (two) times daily.     dicyclomine (BENTYL) 20 MG tablet Take 1 tablet (20 mg total) by mouth 3 (three) times daily as needed for  spasms.    famotidine (PEPCID) 40 MG tablet Take 1 tablet (40 mg total) by mouth at bedtime.    fluticasone (FLONASE) 50 MCG/ACT nasal spray Place 2 sprays into both nostrils daily.    loratadine (CLARITIN) 10 MG tablet Take 1 tablet (10 mg total) by mouth daily.    lovastatin (MEVACOR) 20 MG tablet TAKE ONE TABLET BY MOUTH EVERY DAY AT 6:00PM    Magnesium 500 MG CAPS Take 1 mg by mouth 2 (two) times daily.     NEXIUM 40 MG capsule TAKE ONE CAPSULE BY MOUTH TWICE A DAY BEFORE A MEAL    ondansetron (ZOFRAN) 4 MG tablet Take 1 tablet (4 mg total) by mouth every 8 (eight) hours as needed for nausea or vomiting.    pantoprazole (PROTONIX) 40 MG tablet TAKE 1 TABLET BY MOUTH 30 MINUTES PRIOR TO BREAKFAST AND SUPPER    pregabalin (LYRICA) 75 MG capsule Take 1 capsule (75 mg total) by mouth 2 (two) times daily. For nerve pain    SYNTHROID 175 MCG tablet TAKE ONE TABLET BY MOUTH EVERY DAY BEFORE BREAKFAST.    Thiamine HCl (THIAMINE PO) Take 1 tablet by mouth daily.  02/08/2019: Mg unknown.   erythromycin ophthalmic ointment Place into the left eye in the morning and at bedtime.    ferrous sulfate 325 (65 FE) MG tablet Take 1  tablet (325 mg total) by mouth daily with breakfast.    furosemide (LASIX) 40 MG tablet Take 1 tablet (40 mg total) by mouth daily.    HYDROcodone-acetaminophen (NORCO/VICODIN) 5-325 MG tablet Take 1 tablet by mouth every 6 (six) hours as needed. (Patient not taking: Reported on 03/27/2021)    nitroGLYCERIN (NITROSTAT) 0.4 MG SL tablet Place 1 tablet (0.4 mg total) under the tongue every 5 (five) minutes as needed. (Patient not taking: Reported on 03/27/2021)    No facility-administered encounter medications on file as of 03/27/2021.     Past Medical History:  Diagnosis Date   Allergy    ANEMIA, IRON DEFICIENCY 05/08/2009   Angina    ASYMPTOMATIC POSTMENOPAUSAL STATUS 10/11/2008   Blood transfusion    Blood transfusion without reported diagnosis    Breast cancer ([facility]) 09/29/11    invasive grade III ductal ca,assoc high grade dcis,ER/PR=neg   C. difficile colitis    Cataract    Diverticulosis of colon (without mention of hemorrhage)    Esophageal reflux 06/12/2008   Gastroparesis    GOITER, MULTINODULAR 04/02/2009   Gout, unspecified    H/O hiatal hernia    History of lower GI bleeding    History of radiation therapy 02/08/12-03/25/12   left breast,total 61gy   Hypokalemia 05/11/2013   Hypomagnesemia    HYPOTHYROIDISM, POST-RADIATION 08/13/2009   Internal hemorrhoids without mention of complication    Kidney stones    "several"   Leukopenia    Migraines    Obesity    Osteoarthrosis, unspecified whether generalized or localized, unspecified site    Other and unspecified hyperlipidemia    Personal history of chemotherapy 2013   Personal history of radiation therapy 2013   left   PONV (postoperative nausea and vomiting)    PUD (peptic ulcer disease)    Short bowel syndrome    Shortness of breath on exertion    "sometimes"   Stricture and stenosis of esophagus    Thyrotoxicosis without mention of goiter or other cause, without mention of thyrotoxic crisis or storm    Type II or unspecified type diabetes mellitus without mention of complication, not stated as uncontrolled    no med in years diet controled   Unspecified essential hypertension    UTI (urinary tract infection)    Varicose veins    VITAMIN B12 DEFICIENCY 08/30/2009    Past Surgical History:  Procedure Laterality Date   ABDOMINAL ADHESION SURGERY  1980's thru 1990's   "several"   ABDOMINAL HYSTERECTOMY  1970's   with BSO   BREAST BIOPSY Left 08/13/11   left breast lower inner quadrant   BREAST BIOPSY Right 1985   Rt exc bx, benign   BREAST LUMPECTOMY Left 08/2011   BREAST LUMPECTOMY W/ NEEDLE LOCALIZATION  09/29/11   left  breast=lymph node,excision benign/ ER/PR=neg, her 2 Positive   BUNIONECTOMY  1970's   bilateral   CHOLECYSTECTOMY  1990's   COLON SURGERY     "several surgeries for  short bowel syndrome"   COLONOSCOPY  2012   multiple    DILATION AND CURETTAGE OF UTERUS     ESOPHAGOGASTRODUODENOSCOPY  2011   multiple    EXCISIONAL HEMORRHOIDECTOMY  11/10/2016   EYE SURGERY     "long time ago"   FLEXIBLE SIGMOIDOSCOPY  2011   multiple    KIDNEY STONE SURGERY  1990's   "tried to go up & get it but pushed it further up"   LITHOTRIPSY     "4 or 5  times"   MASTECTOMY W/ NODES PARTIAL  09/29/11   left   PORT-A-CATH REMOVAL Right 12/19/2013   Procedure: MINOR REMOVAL PORT-A-CATH;  Surgeon: Adin Hector, MD;  Location: [facility];  Service: General;  Laterality: Right;   PORTACATH PLACEMENT  09/29/2011   Procedure: INSERTION PORT-A-CATH;  Surgeon: Adin Hector, MD;  Location: [facility];  Service: General;  Laterality: N/A;   SMALL INTESTINE SURGERY     Thyroid Ultrasound  12/1994 and 12/1995   TOTAL KNEE ARTHROPLASTY Right 06/05/2015   Procedure: RIGHT TOTAL KNEE ARTHROPLASTY;  Surgeon: Ninetta Lights, MD;  Location: [facility];  Service: Orthopedics;  Laterality: Right;   UPPER GASTROINTESTINAL ENDOSCOPY     VEIN LIGATION AND STRIPPING  1980's   Right leg    Family History  Problem Relation Age of Onset   Esophageal cancer Son        deceased   Breast cancer Son        esophageal   Diabetes Mother    Heart disease Mother    Kidney disease Sister    Diabetes Father    Hypertension Father    Kidney disease Brother        x 3   Colon cancer Paternal Uncle    Breast cancer Maternal Aunt    Breast cancer Paternal Aunt    Breast cancer Cousin        Pt states she has 15+ cousins w/ Breast CA   Rectal cancer Neg Hx    Stomach cancer Neg Hx     Social History   Social History Narrative   Not on file  Denies tobacco use.  Review of Systems General: Denies fevers, chills, weight loss CV: Denies chest pain, shortness of breath, palpitations  Physical Exam Vitals with BMI 03/27/2021 03/18/2021 02/27/2021  Height 5\' 5"  5\' 5"  5\' 5"   Weight 217  lbs 217 lbs 10 oz 222 lbs 6 oz  BMI 36.11 16.10 96.04  Systolic 540 981 191  Diastolic 85 66 70  Pulse 83 66 71    General:  No acute distress,  Alert and oriented, Non-Toxic, Normal speech and affect Breast: She has grade 3 ptosis bilaterally.  Sternal notch to nipple is 39 cm on the right and 35 cm on the left.  Nipple to fold is 16 cm on the right and 13 cm on the left.  She has a medial lumpectomy scar on the left side with some denting in of the tissue in that area.  She has mild to moderate radiation skin changes on the left side.  The right side is much larger from a volume standpoint.  Assessment/Plan Patient is a good candidate for bilateral breast reduction to address her asymmetry.  I discussed the details of this procedure.  I discussed the risk that include bleeding, infection, damage surrounding structures need for additional procedures.  I discussed the location and orientation of the scars.  We discussed other potential wound healing complications which she has a slightly higher risk for because of the radiation on the left side.  I do think that this would address her symmetry concerns and improve the shape of both breasts.  She is interested in moving forward with this and we will plan to submit to insurance for authorization.  Cindra Presume 03/27/2021, 5:34 PM

## 2021-03-31 ENCOUNTER — Other Ambulatory Visit: Payer: Self-pay | Admitting: Internal Medicine

## 2021-04-01 DIAGNOSIS — Z23 Encounter for immunization: Secondary | ICD-10-CM | POA: Diagnosis not present

## 2021-04-03 ENCOUNTER — Encounter: Payer: Self-pay | Admitting: Obstetrics & Gynecology

## 2021-04-03 ENCOUNTER — Other Ambulatory Visit: Payer: Self-pay

## 2021-04-03 ENCOUNTER — Ambulatory Visit (INDEPENDENT_AMBULATORY_CARE_PROVIDER_SITE_OTHER): Payer: Medicare Other | Admitting: Obstetrics & Gynecology

## 2021-04-03 VITALS — BP 114/76 | HR 72 | Resp 16 | Ht 64.25 in | Wt 221.0 lb

## 2021-04-03 DIAGNOSIS — Z853 Personal history of malignant neoplasm of breast: Secondary | ICD-10-CM

## 2021-04-03 DIAGNOSIS — Z01419 Encounter for gynecological examination (general) (routine) without abnormal findings: Secondary | ICD-10-CM

## 2021-04-03 DIAGNOSIS — Z6837 Body mass index (BMI) 37.0-37.9, adult: Secondary | ICD-10-CM

## 2021-04-03 DIAGNOSIS — Z9189 Other specified personal risk factors, not elsewhere classified: Secondary | ICD-10-CM | POA: Diagnosis not present

## 2021-04-03 DIAGNOSIS — Z9289 Personal history of other medical treatment: Secondary | ICD-10-CM | POA: Diagnosis not present

## 2021-04-03 DIAGNOSIS — Z90710 Acquired absence of both cervix and uterus: Secondary | ICD-10-CM

## 2021-04-03 DIAGNOSIS — Z780 Asymptomatic menopausal state: Secondary | ICD-10-CM

## 2021-04-03 DIAGNOSIS — Z9079 Acquired absence of other genital organ(s): Secondary | ICD-10-CM

## 2021-04-03 DIAGNOSIS — Z90722 Acquired absence of ovaries, bilateral: Secondary | ICD-10-CM

## 2021-04-03 NOTE — Progress Notes (Signed)
[name] [birth year] [id number]   History:    75 y.o. [id number] Widowed.     RP: Established patient presenting for annual gyn exam    HPI:  H/O TAH/BSO in 1970's.  H/O abnormal Paps before Hysterectomy per patient.  Pap test 03/2019 ASCUS/HPV HR Neg, repeat Pap 03/2020 Negative.  H/O Left Breast Ca in 2011/09/11.  Breasts normal currently.  Mammo 10/2020 Neg.  Abstinent.  No pelvic pain.  Urine and BMs normal.  Colono 2020.  BMI 37.64.  Not physically active.  BD normal in 05/2020.  Health labs with Fam MD.   Past medical history,surgical history, family history and social history were all reviewed and documented in the EPIC chart.  Gynecologic History No LMP recorded. Patient has had a hysterectomy.  Obstetric History OB History  Gravida Para Term Preterm AB Living  4 3     1 2   SAB IAB Ectopic Multiple Live Births  1       3    # Outcome Date GA Lbr Len/2nd Weight Sex Delivery Anes PTL Lv  4 SAB           3 Para           2 Para           1 Para             Obstetric Comments  Oldest son passed away in 2006/09/11 from esophageal cancer     ROS: A ROS was performed and pertinent positives and negatives are included in the history.  GENERAL: No fevers or chills. HEENT: No change in vision, no earache, sore throat or sinus congestion. NECK: No pain or stiffness. CARDIOVASCULAR: No chest pain or pressure. No palpitations. PULMONARY: No shortness of breath, cough or wheeze. GASTROINTESTINAL: No abdominal pain, nausea, vomiting or diarrhea, melena or bright red blood per rectum. GENITOURINARY: No urinary frequency, urgency, hesitancy or dysuria. MUSCULOSKELETAL: No joint or muscle pain, no back pain, no recent trauma. DERMATOLOGIC: No rash, no itching, no lesions. ENDOCRINE: No polyuria, polydipsia, no heat or cold intolerance. No recent change in weight. HEMATOLOGICAL: No anemia or easy bruising or bleeding. NEUROLOGIC: No headache, seizures, numbness, tingling or weakness.  PSYCHIATRIC: No depression, no loss of interest in normal activity or change in sleep pattern.     Exam:   BP 114/76   Pulse 72   Resp 16   Ht 5' 4.25" (1.632 m)   Wt 221 lb (100.2 kg)   BMI 37.64 kg/m   Body mass index is 37.64 kg/m.  General appearance : Well developed well nourished female. No acute distress HEENT: Eyes: no retinal hemorrhage or exudates,  Neck supple, trachea midline, no carotid bruits, no thyroidmegaly Lungs: Clear to auscultation, no rhonchi or wheezes, or rib retractions  Heart: Regular rate and rhythm, no murmurs or gallops Breast:Examined in sitting and supine position were symmetrical in appearance, no palpable masses or tenderness,  no skin retraction, no nipple inversion, no nipple discharge, no skin discoloration, no axillary or supraclavicular lymphadenopathy Abdomen: no palpable masses or tenderness, no rebound or guarding Extremities: no edema or skin discoloration or tenderness  Pelvic: Vulva: Normal             Vagina: No gross lesions or discharge  Cervix/Uterus absent  Adnexa  Without masses or tenderness  Anus: Normal   Assessment/Plan:  75 y.o. female for annual exam   1. Wellness Gyn Exam with increased risk d/t breast cancer Gynecologic exam  s/p TAH BSO.  Pap Neg 03/2020, no indication to repeat at this time.  Breasts s/p Left Lumpectomy.  Screening Mammo 10/2020 Neg.  Colono 2020.  Health labs with Fam MD.  2. S/P TAH-BSO  3. Postmenopause Well on no HRT.  BD Normal in 05/2020.  4. Personal history of breast cancer Left Breast Ca.  5. Class 2 severe obesity due to excess calories with serious comorbidity and body mass index (BMI) of 37.0 to 37.9 in adult [facility]) Recommend a lower calorie/carb diet.  Regular fitness activities.  6. Personal history of other medical treatment   Princess Bruins MD, 11:19 AM 04/03/2021

## 2021-04-09 ENCOUNTER — Ambulatory Visit: Payer: Medicare Other | Admitting: Internal Medicine

## 2021-05-22 ENCOUNTER — Ambulatory Visit (INDEPENDENT_AMBULATORY_CARE_PROVIDER_SITE_OTHER): Payer: Medicare Other | Admitting: Surgical

## 2021-05-22 ENCOUNTER — Encounter: Payer: Self-pay | Admitting: Surgical

## 2021-05-22 ENCOUNTER — Other Ambulatory Visit: Payer: Self-pay

## 2021-05-22 VITALS — BP 110/72 | HR 78 | Ht 64.0 in | Wt 219.0 lb

## 2021-05-22 DIAGNOSIS — N6489 Other specified disorders of breast: Secondary | ICD-10-CM

## 2021-05-22 DIAGNOSIS — C50312 Malignant neoplasm of lower-inner quadrant of left female breast: Secondary | ICD-10-CM

## 2021-05-22 MED ORDER — ONDANSETRON HCL 4 MG PO TABS: 4.0000 mg | ORAL_TABLET | Freq: Three times a day (TID) | ORAL | 0 refills | Status: DC | PRN

## 2021-05-22 MED ORDER — HYDROCODONE-ACETAMINOPHEN 5-325 MG PO TABS: 1.0000 | ORAL_TABLET | Freq: Four times a day (QID) | ORAL | 0 refills | Status: DC | PRN

## 2021-05-22 MED ORDER — ENOXAPARIN SODIUM 40 MG/0.4ML IJ SOSY: 40.0000 mg | PREFILLED_SYRINGE | INTRAMUSCULAR | 0 refills | Status: DC

## 2021-05-22 NOTE — H&P (View-Only) (Signed)
Patient ID: [name], female    DOB: May 04, 1946, 75 y.o.   MRN: [id number]  Chief Complaint  Patient presents with   Pre-op Exam      ICD-10-CM   1. Primary cancer of lower-inner quadrant of left female breast ([place])  C50.312     2. Breast asymmetry  N64.89       History of Present Illness: [name] is a 75 y.o.  female  with a history of breast asymmetry after left-sided breast lumpectomy and radiation.  She presents for preoperative evaluation for upcoming procedure, Bilateral Breast Reduction, scheduled for 06/09/21 with Dr.  Claudia Desanctis  Reports postoperative nausea vomiting, otherwise no issues with anesthesia.  Questionable history of DVT in the 1990s, patient reports that she is not 100% sure.   No family history of DVT/PE.  No family or personal history of bleeding or clotting disorders.  Patient is not currently taking any blood thinners.  No history of CVA/MI.   Summary of Previous Visit: She is a diabetic, but this is well controlled.  She does not take any blood thinners.  She is up-to-date on her mammograms and her most recent one in June was normal.  PMH Significant for: Iron deficiency anemia, left breast cancer and left breast radiation, GERD, gout, postoperative nausea vomiting, varicose veins, type 2 diabetes, hypertension. Ascending aortic aneurysm. Asthma. Renal insufficiency.  Recent Hgb 10 (9/22), this is about baseline for patient.  Patient reports that she has been feeling well lately.  She has not had any significant changes in her health.  She reports that she would like to be around a D cup, would like to be smaller than expected.   Past Medical History: Allergies: Allergies  Allergen Reactions   Aspirin Other (See Comments)    REACTION: Gi Intolerance/ Burning in stomach   Trazodone And Nefazodone Nausea And Vomiting    "sick"   Flagyl [Metronidazole] Rash   Morphine And Related Itching    Current Medications:  Current  Outpatient Medications:    albuterol (VENTOLIN HFA) 108 (90 Base) MCG/ACT inhaler, Inhale 2 puffs into the lungs every 6 (six) hours as needed for wheezing or shortness of breath., Disp: 54 g, Rfl: 4   allopurinol (ZYLOPRIM) 100 MG tablet, TAKE ONE TABLET BY MOUTH EVERY DAY, Disp: 90 tablet, Rfl: 3   amitriptyline (ELAVIL) 50 MG tablet, TAKE ONE TABLET BY MOUTH EVERY DAY AT BEDTIME (USE CAUTION - MAY CAUSE DROWSINESS), Disp: 90 tablet, Rfl: 0   calcium carbonate (TUMS - DOSED IN MG ELEMENTAL CALCIUM) 500 MG chewable tablet, Chew 1 tablet by mouth as needed for indigestion or heartburn., Disp: , Rfl:    Calcium Carbonate-Vitamin D (CALCIUM-VITAMIN D) 600-200 MG-UNIT CAPS, Take 1 capsule by mouth daily., Disp: , Rfl:    Cyanocobalamin (VITAMIN B 12 PO), Take 1 tablet by mouth daily., Disp: , Rfl:    dicyclomine (BENTYL) 20 MG tablet, Take 1 tablet (20 mg total) by mouth 3 (three) times daily as needed for spasms., Disp: 360 tablet, Rfl: 3   famotidine (PEPCID) 40 MG tablet, Take 1 tablet (40 mg total) by mouth at bedtime., Disp: 90 tablet, Rfl: 3   ferrous sulfate 325 (65 FE) MG tablet, Take 1 tablet (325 mg total) by mouth daily with breakfast., Disp: , Rfl:    fluticasone (FLONASE) 50 MCG/ACT nasal spray, Place 2 sprays into both nostrils daily., Disp: 16 g, Rfl: 6   HYDROcodone-acetaminophen (NORCO) 5-325 MG tablet, Take 1  tablet by mouth every 6 (six) hours as needed for up to 5 days for severe pain., Disp: 20 tablet, Rfl: 0   HYDROcodone-acetaminophen (NORCO/VICODIN) 5-325 MG tablet, Take 1 tablet by mouth every 6 (six) hours as needed., Disp: 15 tablet, Rfl: 0   loratadine (CLARITIN) 10 MG tablet, Take 1 tablet (10 mg total) by mouth daily., Disp: 90 tablet, Rfl: 3   lovastatin (MEVACOR) 20 MG tablet, TAKE ONE TABLET BY MOUTH EVERY DAY AT 6:00PM, Disp: 90 tablet, Rfl: 2   nitroGLYCERIN (NITROSTAT) 0.4 MG SL tablet, Place 1 tablet (0.4 mg total) under the tongue every 5 (five) minutes as needed.,  Disp: 25 tablet, Rfl: 6   ondansetron (ZOFRAN) 4 MG tablet, Take 1 tablet (4 mg total) by mouth every 8 (eight) hours as needed for nausea or vomiting., Disp: 90 tablet, Rfl: 2   ondansetron (ZOFRAN) 4 MG tablet, Take 1 tablet (4 mg total) by mouth every 8 (eight) hours as needed for nausea or vomiting., Disp: 20 tablet, Rfl: 0   OZEMPIC, 0.25 OR 0.5 MG/DOSE, 2 MG/1.5ML SOPN, INJECT 0.25MG  SUBCUTANEOUSLY ONCE A WEEK FOR 28 DAYS THEN 0.5MG  ONCE A WEEK THEREAFTER - (ADMINISTER BY SUBCUTANEOUS INJECTION INTO THE ABDOMEN, THIGH, OR UPPER ARM AT ANY TIME OF DAY ON THE SAME DAY EACH WEEK) DISCARD PEN 56 DAYS AFTER FIRST USE, Disp: 4.5 mL, Rfl: 0   pantoprazole (PROTONIX) 40 MG tablet, TAKE 1 TABLET BY MOUTH 30 MINUTES PRIOR TO BREAKFAST AND SUPPER, Disp: 180 tablet, Rfl: 1   pregabalin (LYRICA) 75 MG capsule, Take 1 capsule (75 mg total) by mouth 2 (two) times daily. For nerve pain, Disp: 180 capsule, Rfl: 3   SYNTHROID 175 MCG tablet, TAKE ONE TABLET BY MOUTH EVERY DAY BEFORE BREAKFAST., Disp: 90 tablet, Rfl: 1   Thiamine HCl (THIAMINE PO), Take 1 tablet by mouth daily. , Disp: , Rfl:    furosemide (LASIX) 40 MG tablet, Take 1 tablet (40 mg total) by mouth daily., Disp: 90 tablet, Rfl: 3  Past Medical Problems: Past Medical History:  Diagnosis Date   Allergy    ANEMIA, IRON DEFICIENCY 05/08/2009   Angina    ASYMPTOMATIC POSTMENOPAUSAL STATUS 10/11/2008   Blood transfusion    Blood transfusion without reported diagnosis    Breast cancer ([place]) 09/29/11   invasive grade III ductal ca,assoc high grade dcis,ER/PR=neg   C. difficile colitis    Cataract    Diverticulosis of colon (without mention of hemorrhage)    Esophageal reflux 06/12/2008   Gastroparesis    GOITER, MULTINODULAR 04/02/2009   Gout, unspecified    H/O hiatal hernia    History of lower GI bleeding    History of radiation therapy 02/08/12-03/25/12   left breast,total 61gy   Hypokalemia 05/11/2013   Hypomagnesemia    HYPOTHYROIDISM,  POST-RADIATION 08/13/2009   Internal hemorrhoids without mention of complication    Kidney stones    "several"   Leukopenia    Migraines    Obesity    Osteoarthrosis, unspecified whether generalized or localized, unspecified site    Other and unspecified hyperlipidemia    Personal history of chemotherapy 2013   Personal history of radiation therapy 2013   left   PONV (postoperative nausea and vomiting)    PUD (peptic ulcer disease)    Short bowel syndrome    Shortness of breath on exertion    "sometimes"   Stricture and stenosis of esophagus    Thyrotoxicosis without mention of goiter or other cause, without mention  of thyrotoxic crisis or storm    Type II or unspecified type diabetes mellitus without mention of complication, not stated as uncontrolled    no med in years diet controled   Unspecified essential hypertension    UTI (urinary tract infection)    Varicose veins    VITAMIN B12 DEFICIENCY 08/30/2009    Past Surgical History: Past Surgical History:  Procedure Laterality Date   ABDOMINAL ADHESION SURGERY  1980's thru 1990's   "several"   ABDOMINAL HYSTERECTOMY  1970's   with BSO   BREAST BIOPSY Left 08/13/11   left breast lower inner quadrant   BREAST BIOPSY Right 1985   Rt exc bx, benign   BREAST LUMPECTOMY Left 08/2011   BREAST LUMPECTOMY W/ NEEDLE LOCALIZATION  09/29/11   left  breast=lymph node,excision benign/ ER/PR=neg, her 2 Positive   BUNIONECTOMY  1970's   bilateral   CHOLECYSTECTOMY  1990's   COLON SURGERY     "several surgeries for short bowel syndrome"   COLONOSCOPY  2012   multiple    DILATION AND CURETTAGE OF UTERUS     ESOPHAGOGASTRODUODENOSCOPY  2011   multiple    EXCISIONAL HEMORRHOIDECTOMY  11/10/2016   EYE SURGERY     "long time ago"   FLEXIBLE SIGMOIDOSCOPY  2011   multiple    KIDNEY STONE SURGERY  1990's   "tried to go up & get it but pushed it further up"   LITHOTRIPSY     "4 or 5 times"   MASTECTOMY W/ NODES PARTIAL  09/29/11   left    PORT-A-CATH REMOVAL Right 12/19/2013   Procedure: MINOR REMOVAL PORT-A-CATH;  Surgeon: Adin Hector, MD;  Location: [place];  Service: General;  Laterality: Right;   PORTACATH PLACEMENT  09/29/2011   Procedure: INSERTION PORT-A-CATH;  Surgeon: Adin Hector, MD;  Location: [place];  Service: General;  Laterality: N/A;   SMALL INTESTINE SURGERY     Thyroid Ultrasound  12/1994 and 12/1995   TOTAL KNEE ARTHROPLASTY Right 06/05/2015   Procedure: RIGHT TOTAL KNEE ARTHROPLASTY;  Surgeon: Ninetta Lights, MD;  Location: [place];  Service: Orthopedics;  Laterality: Right;   UPPER GASTROINTESTINAL ENDOSCOPY     VEIN LIGATION AND STRIPPING  1980's   Right leg    Social History: Social History   Socioeconomic History   Marital status: Widowed    Spouse name: Not on file   Number of children: 2   Years of education: 10   Highest education level: 10th grade  Occupational History   Occupation: RETIRED  Tobacco Use   Smoking status: Former    Packs/day: 1.00    Years: 10.00    Pack years: 10.00    Types: Cigarettes    Quit date: 09/22/1985    Years since quitting: 35.6   Smokeless tobacco: Never  Vaping Use   Vaping Use: Never used  Substance and Sexual Activity   Alcohol use: No   Drug use: No   Sexual activity: Not Currently    Comment: 1st intercourse- 15, partners- 26, boyfriend- 1.8 yrs  Other Topics Concern   Not on file  Social History Narrative   Not on file   Social Determinants of Health   Financial Resource Strain: High Risk   Difficulty of Paying Living Expenses: Hard  Food Insecurity: No Food Insecurity   Worried About Running Out of Food in the Last Year: Never true   Ran Out of Food in the Last Year: Never true  Transportation  Needs: No Transportation Needs   Lack of Transportation (Medical): No   Lack of Transportation (Non-Medical): No  Physical Activity: Inactive   Days of Exercise per Week: 0 days   Minutes of Exercise per Session: 0 min   Stress: No Stress Concern Present   Feeling of Stress : Not at all  Social Connections: Moderately Integrated   Frequency of Communication with Friends and Family: More than three times a week   Frequency of Social Gatherings with Friends and Family: More than three times a week   Attends Religious Services: More than 4 times per year   Active Member of [employer] or Organizations: Yes   Attends Archivist Meetings: More than 4 times per year   Marital Status: Widowed  Human resources officer Violence: Not At Risk   Fear of Current or Ex-Partner: No   Emotionally Abused: No   Physically Abused: No   Sexually Abused: No    Family History: Family History  Problem Relation Age of Onset   Esophageal cancer Son        deceased   Breast cancer Son        esophageal   Diabetes Mother    Heart disease Mother    Kidney disease Sister    Diabetes Father    Hypertension Father    Kidney disease Brother        x 3   Colon cancer Paternal Uncle    Breast cancer Maternal Aunt    Breast cancer Paternal Aunt    Breast cancer Cousin        Pt states she has 15+ cousins w/ Breast CA   Rectal cancer Neg Hx    Stomach cancer Neg Hx     Review of Systems: Review of Systems  Constitutional: Negative.   Respiratory: Negative.    Cardiovascular: Negative.   Neurological: Negative.    Physical Exam: Vital Signs BP 110/72 (BP Location: Right Arm, Patient Position: Sitting, Cuff Size: Large)    Pulse 78    Ht 5\' 4"  (1.626 m)    Wt 219 lb (99.3 kg)    SpO2 100%    BMI 37.59 kg/m   Physical Exam  Constitutional:      General: Not in acute distress.    Appearance: Normal appearance. Not ill-appearing.  HENT:     Head: Normocephalic and atraumatic.  Eyes:     Pupils: Pupils are equal, round Neck:     Musculoskeletal: Normal range of motion.  Cardiovascular:     Rate and Rhythm: Normal rate    Pulses: Normal pulses.  Pulmonary:     Effort: Pulmonary effort is normal. No respiratory  distress.  Musculoskeletal: Normal range of motion.  Skin:    General: Skin is warm and dry.     Findings: No erythema or rash.  Neurological:     General: No focal deficit present.     Mental Status: Alert and oriented to person, place, and time. Mental status is at baseline.     Motor: No weakness.  Psychiatric:        Mood and Affect: Mood normal.        Behavior: Behavior normal.    Assessment/Plan: The patient is scheduled for bilateral breast reduction with Dr. Claudia Desanctis.  Risks, benefits, and alternatives of procedure discussed, questions answered and consent obtained.    Smoking Status: Quit 30 years ago; Counseling Given?  N/A Last Mammogram: June 2022; Results: Negative  Caprini Score: 11, high; Risk Factors  include: Age, possible history of DVT, history of breast cancer, BMI greater than 25, varicose veins and length of planned surgery. Recommendation for mechanical and pharmacological prophylaxis. Encourage early ambulation.  Will discuss need for 1 week of postop Lovenox with Dr. Claudia Desanctis.  Discussed with patient this may be necessary, she is comfortable with this if necessary.  Unable to find any evidence of DVT in patient's medical history.  Pictures obtained: @consult   Post-op Rx sent to pharmacy: [facility], [place]  Patient was provided with the breast reduction and General Surgical Risk consent document and Pain Medication Agreement prior to their appointment.  They had adequate time to read through the risk consent documents and Pain Medication Agreement. We also discussed them in person together during this preop appointment. All of their questions were answered to their satisfaction.  Recommended calling if they have any further questions.  Risk consent form and Pain Medication Agreement to be scanned into patient's chart.  The risk that can be encountered with breast reduction were discussed and include the following but not limited to these:  Breast asymmetry, fluid accumulation,  firmness of the breast, inability to breast feed, loss of nipple or areola, skin loss, decrease or no nipple sensation, fat necrosis of the breast tissue, bleeding, infection, healing delay.  There are risks of anesthesia, changes to skin sensation and injury to nerves or blood vessels.  The muscle can be temporarily or permanently injured.  You may have an allergic reaction to tape, suture, glue, blood products which can result in skin discoloration, swelling, pain, skin lesions, poor healing.  Any of these can lead to the need for revisonal surgery or stage procedures.  A reduction has potential to interfere with diagnostic procedures.  Nipple or breast piercing can increase risks of infection.  This procedure is best done when the breast is fully developed.  Changes in the breast will continue to occur over time.  Pregnancy can alter the outcomes of previous breast reduction surgery, weight gain and weigh loss can also effect the long term appearance.   We discussed her personal risk factors for postoperative complications including history of radiation and slightly elevated A1c.  We discussed medications to hold prior to surgery.  Addendum: Discussed patient's case with Dr. Claudia Desanctis, will plan for Lovenox for 1 week postop. Will call patient and update  Electronically signed by: Carola Rhine Delicia Berens, PA-C 05/22/2021 2:07 PM

## 2021-05-22 NOTE — Progress Notes (Addendum)
Patient ID: [name], female    DOB: 11-06-1945, 75 y.o.   MRN: [id number]  Chief Complaint  Patient presents with   Pre-op Exam      ICD-10-CM   1. Primary cancer of lower-inner quadrant of left female breast ([place])  C50.312     2. Breast asymmetry  N64.89       History of Present Illness: [name] is a 75 y.o.  female  with a history of breast asymmetry after left-sided breast lumpectomy and radiation.  She presents for preoperative evaluation for upcoming procedure, Bilateral Breast Reduction, scheduled for 06/09/21 with Dr.  Claudia Desanctis  Reports postoperative nausea vomiting, otherwise no issues with anesthesia.  Questionable history of DVT in the 1990s, patient reports that she is not 100% sure.   No family history of DVT/PE.  No family or personal history of bleeding or clotting disorders.  Patient is not currently taking any blood thinners.  No history of CVA/MI.   Summary of Previous Visit: She is a diabetic, but this is well controlled.  She does not take any blood thinners.  She is up-to-date on her mammograms and her most recent one in June was normal.  PMH Significant for: Iron deficiency anemia, left breast cancer and left breast radiation, GERD, gout, postoperative nausea vomiting, varicose veins, type 2 diabetes, hypertension. Ascending aortic aneurysm. Asthma. Renal insufficiency.  Recent Hgb 10 (9/22), this is about baseline for patient.  Patient reports that she has been feeling well lately.  She has not had any significant changes in her health.  She reports that she would like to be around a D cup, would like to be smaller than expected.   Past Medical History: Allergies: Allergies  Allergen Reactions   Aspirin Other (See Comments)    REACTION: Gi Intolerance/ Burning in stomach   Trazodone And Nefazodone Nausea And Vomiting    "sick"   Flagyl [Metronidazole] Rash   Morphine And Related Itching    Current Medications:  Current  Outpatient Medications:    albuterol (VENTOLIN HFA) 108 (90 Base) MCG/ACT inhaler, Inhale 2 puffs into the lungs every 6 (six) hours as needed for wheezing or shortness of breath., Disp: 54 g, Rfl: 4   allopurinol (ZYLOPRIM) 100 MG tablet, TAKE ONE TABLET BY MOUTH EVERY DAY, Disp: 90 tablet, Rfl: 3   amitriptyline (ELAVIL) 50 MG tablet, TAKE ONE TABLET BY MOUTH EVERY DAY AT BEDTIME (USE CAUTION - MAY CAUSE DROWSINESS), Disp: 90 tablet, Rfl: 0   calcium carbonate (TUMS - DOSED IN MG ELEMENTAL CALCIUM) 500 MG chewable tablet, Chew 1 tablet by mouth as needed for indigestion or heartburn., Disp: , Rfl:    Calcium Carbonate-Vitamin D (CALCIUM-VITAMIN D) 600-200 MG-UNIT CAPS, Take 1 capsule by mouth daily., Disp: , Rfl:    Cyanocobalamin (VITAMIN B 12 PO), Take 1 tablet by mouth daily., Disp: , Rfl:    dicyclomine (BENTYL) 20 MG tablet, Take 1 tablet (20 mg total) by mouth 3 (three) times daily as needed for spasms., Disp: 360 tablet, Rfl: 3   famotidine (PEPCID) 40 MG tablet, Take 1 tablet (40 mg total) by mouth at bedtime., Disp: 90 tablet, Rfl: 3   ferrous sulfate 325 (65 FE) MG tablet, Take 1 tablet (325 mg total) by mouth daily with breakfast., Disp: , Rfl:    fluticasone (FLONASE) 50 MCG/ACT nasal spray, Place 2 sprays into both nostrils daily., Disp: 16 g, Rfl: 6   HYDROcodone-acetaminophen (NORCO) 5-325 MG tablet, Take 1  tablet by mouth every 6 (six) hours as needed for up to 5 days for severe pain., Disp: 20 tablet, Rfl: 0   HYDROcodone-acetaminophen (NORCO/VICODIN) 5-325 MG tablet, Take 1 tablet by mouth every 6 (six) hours as needed., Disp: 15 tablet, Rfl: 0   loratadine (CLARITIN) 10 MG tablet, Take 1 tablet (10 mg total) by mouth daily., Disp: 90 tablet, Rfl: 3   lovastatin (MEVACOR) 20 MG tablet, TAKE ONE TABLET BY MOUTH EVERY DAY AT 6:00PM, Disp: 90 tablet, Rfl: 2   nitroGLYCERIN (NITROSTAT) 0.4 MG SL tablet, Place 1 tablet (0.4 mg total) under the tongue every 5 (five) minutes as needed.,  Disp: 25 tablet, Rfl: 6   ondansetron (ZOFRAN) 4 MG tablet, Take 1 tablet (4 mg total) by mouth every 8 (eight) hours as needed for nausea or vomiting., Disp: 90 tablet, Rfl: 2   ondansetron (ZOFRAN) 4 MG tablet, Take 1 tablet (4 mg total) by mouth every 8 (eight) hours as needed for nausea or vomiting., Disp: 20 tablet, Rfl: 0   OZEMPIC, 0.25 OR 0.5 MG/DOSE, 2 MG/1.5ML SOPN, INJECT 0.25MG  SUBCUTANEOUSLY ONCE A WEEK FOR 28 DAYS THEN 0.5MG  ONCE A WEEK THEREAFTER - (ADMINISTER BY SUBCUTANEOUS INJECTION INTO THE ABDOMEN, THIGH, OR UPPER ARM AT ANY TIME OF DAY ON THE SAME DAY EACH WEEK) DISCARD PEN 56 DAYS AFTER FIRST USE, Disp: 4.5 mL, Rfl: 0   pantoprazole (PROTONIX) 40 MG tablet, TAKE 1 TABLET BY MOUTH 30 MINUTES PRIOR TO BREAKFAST AND SUPPER, Disp: 180 tablet, Rfl: 1   pregabalin (LYRICA) 75 MG capsule, Take 1 capsule (75 mg total) by mouth 2 (two) times daily. For nerve pain, Disp: 180 capsule, Rfl: 3   SYNTHROID 175 MCG tablet, TAKE ONE TABLET BY MOUTH EVERY DAY BEFORE BREAKFAST., Disp: 90 tablet, Rfl: 1   Thiamine HCl (THIAMINE PO), Take 1 tablet by mouth daily. , Disp: , Rfl:    furosemide (LASIX) 40 MG tablet, Take 1 tablet (40 mg total) by mouth daily., Disp: 90 tablet, Rfl: 3  Past Medical Problems: Past Medical History:  Diagnosis Date   Allergy    ANEMIA, IRON DEFICIENCY 05/08/2009   Angina    ASYMPTOMATIC POSTMENOPAUSAL STATUS 10/11/2008   Blood transfusion    Blood transfusion without reported diagnosis    Breast cancer ([place]) 09/29/11   invasive grade III ductal ca,assoc high grade dcis,ER/PR=neg   C. difficile colitis    Cataract    Diverticulosis of colon (without mention of hemorrhage)    Esophageal reflux 06/12/2008   Gastroparesis    GOITER, MULTINODULAR 04/02/2009   Gout, unspecified    H/O hiatal hernia    History of lower GI bleeding    History of radiation therapy 02/08/12-03/25/12   left breast,total 61gy   Hypokalemia 05/11/2013   Hypomagnesemia    HYPOTHYROIDISM,  POST-RADIATION 08/13/2009   Internal hemorrhoids without mention of complication    Kidney stones    "several"   Leukopenia    Migraines    Obesity    Osteoarthrosis, unspecified whether generalized or localized, unspecified site    Other and unspecified hyperlipidemia    Personal history of chemotherapy 2013   Personal history of radiation therapy 2013   left   PONV (postoperative nausea and vomiting)    PUD (peptic ulcer disease)    Short bowel syndrome    Shortness of breath on exertion    "sometimes"   Stricture and stenosis of esophagus    Thyrotoxicosis without mention of goiter or other cause, without mention  of thyrotoxic crisis or storm    Type II or unspecified type diabetes mellitus without mention of complication, not stated as uncontrolled    no med in years diet controled   Unspecified essential hypertension    UTI (urinary tract infection)    Varicose veins    VITAMIN B12 DEFICIENCY 08/30/2009    Past Surgical History: Past Surgical History:  Procedure Laterality Date   ABDOMINAL ADHESION SURGERY  1980's thru 1990's   "several"   ABDOMINAL HYSTERECTOMY  1970's   with BSO   BREAST BIOPSY Left 08/13/11   left breast lower inner quadrant   BREAST BIOPSY Right 1985   Rt exc bx, benign   BREAST LUMPECTOMY Left 08/2011   BREAST LUMPECTOMY W/ NEEDLE LOCALIZATION  09/29/11   left  breast=lymph node,excision benign/ ER/PR=neg, her 2 Positive   BUNIONECTOMY  1970's   bilateral   CHOLECYSTECTOMY  1990's   COLON SURGERY     "several surgeries for short bowel syndrome"   COLONOSCOPY  2012   multiple    DILATION AND CURETTAGE OF UTERUS     ESOPHAGOGASTRODUODENOSCOPY  2011   multiple    EXCISIONAL HEMORRHOIDECTOMY  11/10/2016   EYE SURGERY     "long time ago"   FLEXIBLE SIGMOIDOSCOPY  2011   multiple    KIDNEY STONE SURGERY  1990's   "tried to go up & get it but pushed it further up"   LITHOTRIPSY     "4 or 5 times"   MASTECTOMY W/ NODES PARTIAL  09/29/11   left    PORT-A-CATH REMOVAL Right 12/19/2013   Procedure: MINOR REMOVAL PORT-A-CATH;  Surgeon: Adin Hector, MD;  Location: [place];  Service: General;  Laterality: Right;   PORTACATH PLACEMENT  09/29/2011   Procedure: INSERTION PORT-A-CATH;  Surgeon: Adin Hector, MD;  Location: [place];  Service: General;  Laterality: N/A;   SMALL INTESTINE SURGERY     Thyroid Ultrasound  12/1994 and 12/1995   TOTAL KNEE ARTHROPLASTY Right 06/05/2015   Procedure: RIGHT TOTAL KNEE ARTHROPLASTY;  Surgeon: Ninetta Lights, MD;  Location: [place];  Service: Orthopedics;  Laterality: Right;   UPPER GASTROINTESTINAL ENDOSCOPY     VEIN LIGATION AND STRIPPING  1980's   Right leg    Social History: Social History   Socioeconomic History   Marital status: Widowed    Spouse name: Not on file   Number of children: 2   Years of education: 10   Highest education level: 10th grade  Occupational History   Occupation: RETIRED  Tobacco Use   Smoking status: Former    Packs/day: 1.00    Years: 10.00    Pack years: 10.00    Types: Cigarettes    Quit date: 09/22/1985    Years since quitting: 35.6   Smokeless tobacco: Never  Vaping Use   Vaping Use: Never used  Substance and Sexual Activity   Alcohol use: No   Drug use: No   Sexual activity: Not Currently    Comment: 1st intercourse- 15, partners- 67, boyfriend- 1.8 yrs  Other Topics Concern   Not on file  Social History Narrative   Not on file   Social Determinants of Health   Financial Resource Strain: High Risk   Difficulty of Paying Living Expenses: Hard  Food Insecurity: No Food Insecurity   Worried About Running Out of Food in the Last Year: Never true   Ran Out of Food in the Last Year: Never true  Transportation  Needs: No Transportation Needs   Lack of Transportation (Medical): No   Lack of Transportation (Non-Medical): No  Physical Activity: Inactive   Days of Exercise per Week: 0 days   Minutes of Exercise per Session: 0 min   Stress: No Stress Concern Present   Feeling of Stress : Not at all  Social Connections: Moderately Integrated   Frequency of Communication with Friends and Family: More than three times a week   Frequency of Social Gatherings with Friends and Family: More than three times a week   Attends Religious Services: More than 4 times per year   Active Member of [employer] or Organizations: Yes   Attends Archivist Meetings: More than 4 times per year   Marital Status: Widowed  Human resources officer Violence: Not At Risk   Fear of Current or Ex-Partner: No   Emotionally Abused: No   Physically Abused: No   Sexually Abused: No    Family History: Family History  Problem Relation Age of Onset   Esophageal cancer Son        deceased   Breast cancer Son        esophageal   Diabetes Mother    Heart disease Mother    Kidney disease Sister    Diabetes Father    Hypertension Father    Kidney disease Brother        x 3   Colon cancer Paternal Uncle    Breast cancer Maternal Aunt    Breast cancer Paternal Aunt    Breast cancer Cousin        Pt states she has 15+ cousins w/ Breast CA   Rectal cancer Neg Hx    Stomach cancer Neg Hx     Review of Systems: Review of Systems  Constitutional: Negative.   Respiratory: Negative.    Cardiovascular: Negative.   Neurological: Negative.    Physical Exam: Vital Signs BP 110/72 (BP Location: Right Arm, Patient Position: Sitting, Cuff Size: Large)    Pulse 78    Ht 5\' 4"  (1.626 m)    Wt 219 lb (99.3 kg)    SpO2 100%    BMI 37.59 kg/m   Physical Exam  Constitutional:      General: Not in acute distress.    Appearance: Normal appearance. Not ill-appearing.  HENT:     Head: Normocephalic and atraumatic.  Eyes:     Pupils: Pupils are equal, round Neck:     Musculoskeletal: Normal range of motion.  Cardiovascular:     Rate and Rhythm: Normal rate    Pulses: Normal pulses.  Pulmonary:     Effort: Pulmonary effort is normal. No respiratory  distress.  Musculoskeletal: Normal range of motion.  Skin:    General: Skin is warm and dry.     Findings: No erythema or rash.  Neurological:     General: No focal deficit present.     Mental Status: Alert and oriented to person, place, and time. Mental status is at baseline.     Motor: No weakness.  Psychiatric:        Mood and Affect: Mood normal.        Behavior: Behavior normal.    Assessment/Plan: The patient is scheduled for bilateral breast reduction with Dr. Claudia Desanctis.  Risks, benefits, and alternatives of procedure discussed, questions answered and consent obtained.    Smoking Status: Quit 30 years ago; Counseling Given?  N/A Last Mammogram: June 2022; Results: Negative  Caprini Score: 11, high; Risk Factors  include: Age, possible history of DVT, history of breast cancer, BMI greater than 25, varicose veins and length of planned surgery. Recommendation for mechanical and pharmacological prophylaxis. Encourage early ambulation.  Will discuss need for 1 week of postop Lovenox with Dr. Claudia Desanctis.  Discussed with patient this may be necessary, she is comfortable with this if necessary.  Unable to find any evidence of DVT in patient's medical history.  Pictures obtained: @consult   Post-op Rx sent to pharmacy: [facility], [place]  Patient was provided with the breast reduction and General Surgical Risk consent document and Pain Medication Agreement prior to their appointment.  They had adequate time to read through the risk consent documents and Pain Medication Agreement. We also discussed them in person together during this preop appointment. All of their questions were answered to their satisfaction.  Recommended calling if they have any further questions.  Risk consent form and Pain Medication Agreement to be scanned into patient's chart.  The risk that can be encountered with breast reduction were discussed and include the following but not limited to these:  Breast asymmetry, fluid accumulation,  firmness of the breast, inability to breast feed, loss of nipple or areola, skin loss, decrease or no nipple sensation, fat necrosis of the breast tissue, bleeding, infection, healing delay.  There are risks of anesthesia, changes to skin sensation and injury to nerves or blood vessels.  The muscle can be temporarily or permanently injured.  You may have an allergic reaction to tape, suture, glue, blood products which can result in skin discoloration, swelling, pain, skin lesions, poor healing.  Any of these can lead to the need for revisonal surgery or stage procedures.  A reduction has potential to interfere with diagnostic procedures.  Nipple or breast piercing can increase risks of infection.  This procedure is best done when the breast is fully developed.  Changes in the breast will continue to occur over time.  Pregnancy can alter the outcomes of previous breast reduction surgery, weight gain and weigh loss can also effect the long term appearance.   We discussed her personal risk factors for postoperative complications including history of radiation and slightly elevated A1c.  We discussed medications to hold prior to surgery.  Addendum: Discussed patient's case with Dr. Claudia Desanctis, will plan for Lovenox for 1 week postop. Will call patient and update  Electronically signed by: Carola Rhine Shemeka Wardle, PA-C 05/22/2021 2:07 PM

## 2021-05-22 NOTE — Addendum Note (Signed)
Addended byRoetta Sessions on: 05/22/2021 03:50 PM   Modules accepted: Orders

## 2021-05-28 ENCOUNTER — Telehealth: Payer: Self-pay

## 2021-05-28 NOTE — Progress Notes (Signed)
Surgical Instructions   Your procedure is scheduled on Monday 06/09/2021.  Report to [facility] Main Entrance "A" at 08:30 A.M., then check in with the Admitting office.  Call [phone] if you have problems or questions between now and the morning of surgery:   Remember: Do not eat after midnight the night before your surgery  You may drink clear liquids until 07:30 a.m. the morning of your surgery.   Clear liquids allowed are: Water, Non-Citrus Juices (without pulp), Carbonated Beverages, Clear Tea, Black Coffee Only (NO MILK, CREAM, or POWDERED CREAMER of any kind), and Gatorade   Take these medicines the morning of surgery with A SIP OF WATER:  Allopurinol (Zyloprim) Pregabalin (Lyric) Synthroid  If needed you may take these medications the morning of surgery: Albuterol (Ventolin) inhaler Fluticasone (Flonase) Hydrocodone-acetaminophen (Norco/Vicodin) Nitroglycerin (Nitrostat) Ondansetron (Zofran)   As of today, STOP taking any Aspirin (unless otherwise instructed by your surgeon) or Aspirin-containing products; NSAIDS - Aleve, Naproxen, Ibuprofen, Motrin, Advil, Goody's, BC's, all herbal medications, fish oil, and all vitamins.  WHAT DO I DO ABOUT MY DIABETES MEDICATION?  The day of surgery, do not take other diabetes injectables, including Ozempic.   HOW TO MANAGE YOUR DIABETES BEFORE AND AFTER SURGERY  Why is it important to control my blood sugar before and after surgery? Improving blood sugar levels before and after surgery helps healing and can limit problems. A way of improving blood sugar control is eating a healthy diet by:  Eating less sugar and carbohydrates  Increasing activity/exercise  Talking with your doctor about reaching your blood sugar goals High blood sugars (greater than 180 mg/dL) can raise your risk of infections and slow your recovery, so you will need to focus on controlling your diabetes during the weeks before surgery. Make sure that the  doctor who takes care of your diabetes knows about your planned surgery including the date and location.  How do I manage my blood sugar before surgery? Check your blood sugar at least 4 times a day, starting 2 days before surgery, to make sure that the level is not too high or low.  Check your blood sugar the morning of your surgery when you wake up and every 2 hours until you get to the Short Stay unit.  If your blood sugar is less than 70 mg/dL, you will need to treat for low blood sugar: Do not take insulin. Treat a low blood sugar (less than 70 mg/dL) with  cup of clear juice (cranberry or apple), 4 glucose tablets, OR glucose gel. Recheck blood sugar in 15 minutes after treatment (to make sure it is greater than 70 mg/dL). If your blood sugar is not greater than 70 mg/dL on recheck, call [phone] for further instructions. Report your blood sugar to the short stay nurse when you get to Short Stay.  If you are admitted to the hospital after surgery: Your blood sugar will be checked by the staff and you will probably be given insulin after surgery (instead of oral diabetes medicines) to make sure you have good blood sugar levels. The goal for blood sugar control after surgery is 80-180 mg/dL.   3 days leading up to your surgery OR after your pre-procedure COVID test  You are not required to quarantine however you are required to wear a well-fitting mask when you are out and around people not in your household.  If your mask becomes wet or soiled, replace with a new one.  Wash your hands often with  soap and water for 20 seconds or clean your hands with an alcohol-based hand sanitizer that contains at least 60% alcohol.  Do not share personal items.  Notify your provider: if you are in close contact with someone who has COVID  or if you develop a fever of 100.4 or greater, sneezing, cough, sore throat, shortness of breath or body aches.          Do not wear jewelry or makeup  Do  not wear lotions, powders, perfumes/colognes, or deodorant.  Do not shave 48 hours prior to surgery.  Men may shave face and neck.  Do not wear nail polish, gel polish, artificial nails, or any other type of covering on natural nails including fingernails and toenails. If patients have artificial nails, gel coating, etc. that need to be removed by a nail salon please have this removed prior to surgery or surgery may need to be canceled/delayed if the surgeon/ anesthesia feels like the patient is unable to be adequately monitored.  Do not bring valuables to the hospital - [facility] is not responsible for any belongings or valuables.  Do NOT Smoke (Tobacco/Vaping) or drink Alcohol 24 hours prior to your procedure  If you use a CPAP at night, please bring your mask for your overnight stay.   Contacts, glasses, hearing aids, dentures or partials may not be worn into surgery, please bring cases for these belongings   For patients admitted to the hospital, discharge time will be determined by your treatment team.   Patients discharged the day of surgery will not be allowed to drive home, and someone needs to stay with them for 24 hours.  NO VISITORS WILL BE ALLOWED IN PRE-OP WHERE PATIENTS ARE PREPPED FOR SURGERY.  ONLY 1 SUPPORT PERSON MAY BE PRESENT IN THE WAITING ROOM WHILE YOU ARE IN SURGERY.  IF YOU ARE TO BE ADMITTED, ONCE YOU ARE IN YOUR ROOM YOU WILL BE ALLOWED TWO (2) VISITORS. 1 (ONE) VISITOR MAY STAY OVERNIGHT BUT MUST ARRIVE TO THE ROOM BY 8pm.  Minor children may have two parents present. Special consideration for safety and communication needs will be reviewed on a case by case basis.  Special instructions:    Oral Hygiene is also important to reduce your risk of infection.  Remember - BRUSH YOUR TEETH THE MORNING OF SURGERY WITH YOUR REGULAR TOOTHPASTE   [place]- Preparing For Surgery  Before surgery, you can play an important role. Because skin is not sterile, your skin  needs to be as free of germs as possible. You can reduce the number of germs on your skin by washing with CHG (chlorahexidine gluconate) Soap before surgery.  CHG is an antiseptic cleaner which kills germs and bonds with the skin to continue killing germs even after washing.     Please do not use if you have an allergy to CHG or antibacterial soaps. If your skin becomes reddened/irritated stop using the CHG.  Do not shave (including legs and underarms) for at least 48 hours prior to first CHG shower. It is OK to shave your face.  Please follow these instructions carefully.     Shower the NIGHT BEFORE SURGERY and the MORNING OF SURGERY with CHG Soap.   If you chose to wash your hair, wash your hair first as usual with your normal shampoo. After you shampoo, rinse your hair and body thoroughly to remove the shampoo.    Then [employer] and genitals (private parts) with your normal soap and rinse  thoroughly to remove soap.  Next use the CHG Soap as you would any other liquid soap. You can apply CHG directly to the skin and wash gently with a clean washcloth.   Apply the CHG Soap to your body ONLY FROM THE NECK DOWN.  Do not use on open wounds or open sores. Avoid contact with your eyes, ears, mouth and genitals (private parts). Wash Face and genitals (private parts)  with your normal soap.   Wash thoroughly, paying special attention to the area where your surgery will be performed.  Thoroughly rinse your body with warm water from the neck down.  DO NOT shower/wash with your normal soap after using and rinsing off the CHG Soap.  Pat yourself dry with a CLEAN TOWEL.  Wear CLEAN PAJAMAS to bed the night before surgery  Place CLEAN SHEETS on your bed the night before your surgery  DO NOT SLEEP WITH PETS.   Day of Surgery:  Take a shower with CHG soap. Wear Clean/Comfortable clothing the morning of surgery Do not apply any deodorants/lotions.   Remember to brush your teeth WITH YOUR  REGULAR TOOTHPASTE.   Please read over the fact sheets that you were given.

## 2021-05-28 NOTE — Telephone Encounter (Signed)
Faxed Surgical Clearance form to Dr. Pricilla Holm, MD. Surgery with Dr. Pace-06/09/21 for BL breast reduction.

## 2021-05-29 ENCOUNTER — Encounter (HOSPITAL_COMMUNITY): Payer: Self-pay

## 2021-05-29 ENCOUNTER — Encounter (HOSPITAL_COMMUNITY)
Admission: RE | Admit: 2021-05-29 | Discharge: 2021-05-29 | Disposition: A | Discharge status: Home / Self Care | Payer: Medicare Other | Source: Ambulatory Visit | Attending: Plastic Surgery | Admitting: Plastic Surgery

## 2021-05-29 ENCOUNTER — Other Ambulatory Visit: Payer: Self-pay

## 2021-05-29 VITALS — BP 134/90 | HR 84 | Temp 98.2°F | Resp 18 | Ht 64.0 in | Wt 225.0 lb

## 2021-05-29 DIAGNOSIS — Z01812 Encounter for preprocedural laboratory examination: Secondary | ICD-10-CM | POA: Insufficient documentation

## 2021-05-29 DIAGNOSIS — E119 Type 2 diabetes mellitus without complications: Secondary | ICD-10-CM | POA: Diagnosis not present

## 2021-05-29 DIAGNOSIS — Z01818 Encounter for other preprocedural examination: Secondary | ICD-10-CM

## 2021-05-29 HISTORY — DX: Dependence on supplemental oxygen: Z99.81

## 2021-05-29 HISTORY — DX: Personal history of urinary calculi: Z87.442

## 2021-05-29 LAB — GLUCOSE, CAPILLARY: Glucose-Capillary: 93 mg/dL (ref 70–99)

## 2021-05-29 LAB — CBC
HCT: 31.1 % — ABNORMAL LOW (ref 36.0–46.0)
Hemoglobin: 10 g/dL — ABNORMAL LOW (ref 12.0–15.0)
MCH: 29.3 pg (ref 26.0–34.0)
MCHC: 32.2 g/dL (ref 30.0–36.0)
MCV: 91.2 fL (ref 80.0–100.0)
Platelets: 218 10*3/uL (ref 150–400)
RBC: 3.41 MIL/uL — ABNORMAL LOW (ref 3.87–5.11)
RDW: 14 % (ref 11.5–15.5)
WBC: 4.3 10*3/uL (ref 4.0–10.5)
nRBC: 0 % (ref 0.0–0.2)

## 2021-05-29 LAB — BASIC METABOLIC PANEL
Anion gap: 7 (ref 5–15)
BUN: 13 mg/dL (ref 8–23)
CO2: 29 mmol/L (ref 22–32)
Calcium: 8.9 mg/dL (ref 8.9–10.3)
Chloride: 107 mmol/L (ref 98–111)
Creatinine, Ser: 0.92 mg/dL (ref 0.44–1.00)
GFR, Estimated: >60 mL/min (ref >60)
Glucose, Bld: 93 mg/dL (ref 70–99)
Potassium: 4 mmol/L (ref 3.5–5.1)
Sodium: 143 mmol/L (ref 135–145)

## 2021-05-29 LAB — HEMOGLOBIN A1C
Hgb A1c MFr Bld: 5.5 % (ref 4.8–5.6)
Mean Plasma Glucose: 111.15 mg/dL

## 2021-05-29 NOTE — Progress Notes (Signed)
PCP - Pricilla Holm Cardiologist - Dr. Margaretann Loveless  PPM/ICD - n/a Device Orders -  Rep Notified -   Chest x-ray - n/a EKG - 09/05/20 Stress Test - 2012 ECHO - 08/19/20 Cardiac Cath - n/a  Sleep Study - n/a, patient refuses.  Wears supplemental O2 at night  CPAP -   Fasting Blood Sugar - unknown, patient does not check CBG at home Checks Blood Sugar _____ times a day  Blood Thinner Instructions: n/a Aspirin Instructions: n/a  ERAS Protcol - clears until 0730 PRE-SURGERY Ensure or G2- none ordered  COVID TEST- ambulatory surgery   Anesthesia review: n/a  Patient denies shortness of breath, fever, cough and chest pain at PAT appointment   All instructions explained to the patient, with a verbal understanding of the material. Patient agrees to go over the instructions while at home for a better understanding. Patient also instructed to wear a mask prior to surgery. The opportunity to ask questions was provided.

## 2021-05-30 NOTE — Anesthesia Preprocedure Evaluation (Addendum)
Anesthesia Evaluation  Patient identified by MRN, date of birth, ID band Patient awake    Reviewed: Allergy & Precautions, NPO status , Patient's Chart, lab work & pertinent test results  History of Anesthesia Complications (+) PONV  Airway Mallampati: II  TM Distance: >3 FB     Dental   Pulmonary former smoker,    breath sounds clear to auscultation       Cardiovascular hypertension, + angina + DOE   Rhythm:Regular Rate:Normal     Neuro/Psych    GI/Hepatic Neg liver ROS, hiatal hernia, PUD, GERD  ,  Endo/Other  diabetes  Renal/GU Renal disease     Musculoskeletal   Abdominal   Peds  Hematology   Anesthesia Other Findings   Reproductive/Obstetrics                            Anesthesia Physical Anesthesia Plan  ASA: 3  Anesthesia Plan: General   Post-op Pain Management:    Induction: Intravenous  PONV Risk Score and Plan: Ondansetron and Midazolam  Airway Management Planned: Oral ETT  Additional Equipment:   Intra-op Plan:   Post-operative Plan: Extubation in OR  Informed Consent: I have reviewed the patients History and Physical, chart, labs and discussed the procedure including the risks, benefits and alternatives for the proposed anesthesia with the patient or authorized representative who has indicated his/her understanding and acceptance.     Dental advisory given  Plan Discussed with: CRNA and Anesthesiologist  Anesthesia Plan Comments: (PAT note by Karoline Caldwell, PA-C: Follows with cardiology for history of mild CAD seen on coronary CT, DOE, lower extremity edema.  Last seen by Dr. Margaretann Loveless 09/05/2020  for follow-up of episode of chest pain.  Per note, "She is chest pain-free today and overall feels well. She continues to have right leg swelling. BNP was in normal range. We have dealt with lower extremity swelling for her in the past. Recommend conservative measures at  this time given no evidence of DVT PE."  DM2, not on insulin, well-controlled A1c 5.5 on preop labs.  Former smoker (quit 1980, who follows with pulmonology for history of SOB/DOE complicated by hiatal hernia, GERD/stricture, gastroparesis, moderate persistent asthma.  PFTs 03/22/2020 showed minimal obstruction, insignificant response to bronchodilator, mild diffusion deficit.  Overnight oximetry showed desaturations and she qualified for nocturnal supplemental oxygen.  She uses 2 L nightly.  She is maintained on DuoNeb as needed.  History of left breast cancer status postchemotherapy and radiation.  Chronic IDA with baseline hemoglobin ~10.  Preop labs reviewed, hemoglobin 10 consistent with history of anemia, otherwise unremarkable.  EKG 09/05/2020: NSR.  Rate 78.  Minimal voltage criteria for LVH, may be normal variant.  TTE 08/19/2020: 1. Left ventricular ejection fraction, by estimation, is 60 to 65%. The  left ventricle has normal function. The left ventricle has no regional  wall motion abnormalities. There is mild left ventricular hypertrophy.  Left ventricular diastolic parameters  were normal.  2. Right ventricular systolic function is normal. The right ventricular  size is normal.  3. The mitral valve is normal in structure. No evidence of mitral valve  regurgitation. No evidence of mitral stenosis.  4. The aortic valve is normal in structure. Aortic valve regurgitation is  mild. No aortic stenosis is present.  5. See above regarding sinus dilatation Ascending aortic root also  dilated at 4.1 cm Dilatation is largest in the non coronary sinus . Aortic  dilatation noted. There  is severe dilatation of the aortic root, measuring  49 mm.  6. The inferior vena cava is normal in size with greater than 50%  respiratory variability, suggesting right atrial pressure of 3 mmHg.  Coronary CT 07/12/2019: IMPRESSION: 1. The patient's coronary artery calcium score is 32, which  places the patient in the 57 percentile.  2. Normal coronary origin with right dominance.  3. Mild CAD, CADRADS = 2. Mild calcified plaque in the proximal and mid LAD.  4. Sinus of Valsalva dilatation measuring 49 mm from Left to Non coronary cusp. )       Anesthesia Quick Evaluation

## 2021-05-30 NOTE — Progress Notes (Signed)
Anesthesia Chart Review:  Follows with cardiology for history of mild CAD seen on coronary CT, DOE, lower extremity edema.  Last seen by Dr. Margaretann Loveless 09/05/2020  for follow-up of episode of chest pain.  Per note, "She is chest pain-free today and overall feels well.  She continues to have right leg swelling.  BNP was in normal range.  We have dealt with lower extremity swelling for her in the past.  Recommend conservative measures at this time given no evidence of DVT PE."   DM2, not on insulin, well-controlled A1c 5.5 on preop labs.  Former smoker (quit 1980, who follows with pulmonology for history of SOB/DOE complicated by hiatal hernia, GERD/stricture, gastroparesis, moderate persistent asthma.  PFTs 03/22/2020 showed minimal obstruction, insignificant response to bronchodilator, mild diffusion deficit.  Overnight oximetry showed desaturations and she qualified for nocturnal supplemental oxygen.  She uses 2 L nightly.  She is maintained on DuoNeb as needed.  History of left breast cancer status postchemotherapy and radiation.  Chronic IDA with baseline hemoglobin ~10.  Preop labs reviewed, hemoglobin 10 consistent with history of anemia, otherwise unremarkable.  EKG 09/05/2020: NSR.  Rate 78.  Minimal voltage criteria for LVH, may be normal variant.  TTE 08/19/2020:  1. Left ventricular ejection fraction, by estimation, is 60 to 65%. The  left ventricle has normal function. The left ventricle has no regional  wall motion abnormalities. There is mild left ventricular hypertrophy.  Left ventricular diastolic parameters  were normal.   2. Right ventricular systolic function is normal. The right ventricular  size is normal.   3. The mitral valve is normal in structure. No evidence of mitral valve  regurgitation. No evidence of mitral stenosis.   4. The aortic valve is normal in structure. Aortic valve regurgitation is  mild. No aortic stenosis is present.   5. 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 Aortic  dilatation noted. There is severe dilatation of the aortic root, measuring  49 mm.   6. The inferior vena cava is normal in size with greater than 50%  respiratory variability, suggesting right atrial pressure of 3 mmHg.  Coronary CT 07/12/2019: IMPRESSION: 1. The patient's coronary artery calcium score is 32, which places the patient in the 57 percentile.   2. Normal coronary origin with right dominance.   3. Mild CAD, CADRADS = 2. Mild calcified plaque in the proximal and mid LAD.   4. Sinus of Valsalva dilatation measuring 49 mm from Left to Non coronary cusp.   Wynonia Musty [facility] Stay [facility]/Anesthesiology Phone [phone] 05/30/2021 12:19 PM

## 2021-06-04 ENCOUNTER — Telehealth: Payer: Self-pay | Admitting: Internal Medicine

## 2021-06-04 ENCOUNTER — Telehealth: Payer: Self-pay

## 2021-06-04 NOTE — Telephone Encounter (Signed)
Called [facility] to check on status of surgery clearance for upcoming surgery 06/09/2021 with Dr. Claudia Desanctis. Spoke with [facility], stated it has not been received. Verified fax# [phone]. She will follow up with physician and send a note.

## 2021-06-04 NOTE — Telephone Encounter (Signed)
-----   Message from Charlies Constable, PA-C sent at 05/22/2021  1:18 PM EST ----- Regarding: Pre-op clearance I would like to send PCP clearance for this patient's upcoming surgery with Dr. Claudia Desanctis on 06/09/2020.  Hoyt Koch, MD Phone: [phone] Fax: [phone]  No specific meds to hold. Would just like medical clearance for upcoming breast reduction

## 2021-06-04 NOTE — Telephone Encounter (Signed)
Patient is scheduled for surgery January 9, she needs surgical clearance for the patient. [phone]

## 2021-06-05 NOTE — Telephone Encounter (Signed)
Surgical clearance recieved

## 2021-06-06 NOTE — Telephone Encounter (Signed)
Surgical clearance form was faxed on 06/03/2021. Form will be re-faxed today.

## 2021-06-09 ENCOUNTER — Ambulatory Visit (HOSPITAL_COMMUNITY): Payer: Medicare Other | Admitting: Physician Assistant

## 2021-06-09 ENCOUNTER — Other Ambulatory Visit: Payer: Self-pay

## 2021-06-09 ENCOUNTER — Encounter (HOSPITAL_COMMUNITY)
Admission: RE | Disposition: A | Discharge status: Home / Self Care | Payer: Self-pay | Source: Home / Self Care | Attending: Plastic Surgery

## 2021-06-09 ENCOUNTER — Ambulatory Visit (HOSPITAL_COMMUNITY)
Admission: RE | Admit: 2021-06-09 | Discharge: 2021-06-09 | Disposition: A | Discharge status: Home / Self Care | Payer: Medicare Other | Attending: Plastic Surgery | Admitting: Plastic Surgery

## 2021-06-09 ENCOUNTER — Encounter (HOSPITAL_COMMUNITY): Payer: Self-pay | Admitting: Plastic Surgery

## 2021-06-09 ENCOUNTER — Ambulatory Visit (HOSPITAL_COMMUNITY): Payer: Medicare Other | Admitting: Certified Registered"

## 2021-06-09 DIAGNOSIS — K449 Diaphragmatic hernia without obstruction or gangrene: Secondary | ICD-10-CM | POA: Diagnosis not present

## 2021-06-09 DIAGNOSIS — N651 Disproportion of reconstructed breast: Secondary | ICD-10-CM | POA: Diagnosis not present

## 2021-06-09 DIAGNOSIS — N6489 Other specified disorders of breast: Secondary | ICD-10-CM | POA: Insufficient documentation

## 2021-06-09 DIAGNOSIS — C50312 Malignant neoplasm of lower-inner quadrant of left female breast: Secondary | ICD-10-CM | POA: Diagnosis not present

## 2021-06-09 DIAGNOSIS — Z7984 Long term (current) use of oral hypoglycemic drugs: Secondary | ICD-10-CM | POA: Diagnosis not present

## 2021-06-09 DIAGNOSIS — I10 Essential (primary) hypertension: Secondary | ICD-10-CM | POA: Insufficient documentation

## 2021-06-09 DIAGNOSIS — Z853 Personal history of malignant neoplasm of breast: Secondary | ICD-10-CM | POA: Diagnosis not present

## 2021-06-09 DIAGNOSIS — N62 Hypertrophy of breast: Secondary | ICD-10-CM | POA: Diagnosis not present

## 2021-06-09 DIAGNOSIS — C50412 Malignant neoplasm of upper-outer quadrant of left female breast: Secondary | ICD-10-CM | POA: Diagnosis not present

## 2021-06-09 DIAGNOSIS — N6012 Diffuse cystic mastopathy of left breast: Secondary | ICD-10-CM | POA: Diagnosis not present

## 2021-06-09 DIAGNOSIS — E119 Type 2 diabetes mellitus without complications: Secondary | ICD-10-CM | POA: Diagnosis not present

## 2021-06-09 DIAGNOSIS — K219 Gastro-esophageal reflux disease without esophagitis: Secondary | ICD-10-CM | POA: Insufficient documentation

## 2021-06-09 DIAGNOSIS — Z87891 Personal history of nicotine dependence: Secondary | ICD-10-CM | POA: Diagnosis not present

## 2021-06-09 DIAGNOSIS — N6011 Diffuse cystic mastopathy of right breast: Secondary | ICD-10-CM | POA: Diagnosis not present

## 2021-06-09 HISTORY — PX: BREAST REDUCTION SURGERY: SHX8

## 2021-06-09 LAB — GLUCOSE, CAPILLARY
Glucose-Capillary: 102 mg/dL — ABNORMAL HIGH (ref 70–99)
Glucose-Capillary: 128 mg/dL — ABNORMAL HIGH (ref 70–99)

## 2021-06-09 SURGERY — MAMMOPLASTY, REDUCTION
Anesthesia: General | Site: Breast | Laterality: Bilateral

## 2021-06-09 MED ORDER — ORAL CARE MOUTH RINSE: 15.0000 mL | Freq: Once | OROMUCOSAL | Status: AC

## 2021-06-09 MED ORDER — CHLORHEXIDINE GLUCONATE CLOTH 2 % EX PADS: 6.0000 | MEDICATED_PAD | Freq: Once | CUTANEOUS | Status: DC

## 2021-06-09 MED ORDER — ALBUMIN HUMAN 5 % IV SOLN: INTRAVENOUS | Status: DC | PRN

## 2021-06-09 MED ORDER — EPINEPHRINE PF 1 MG/ML IJ SOLN
INTRAMUSCULAR | Status: AC
  Filled 2021-06-09: qty 2

## 2021-06-09 MED ORDER — FENTANYL CITRATE (PF) 250 MCG/5ML IJ SOLN
INTRAMUSCULAR | Status: DC | PRN
Start: 2021-06-09 — End: 2021-06-09
  Administered 2021-06-09 (×3): 50 ug via INTRAVENOUS

## 2021-06-09 MED ORDER — CHLORHEXIDINE GLUCONATE 0.12 % MT SOLN
15.0000 mL | Freq: Once | OROMUCOSAL | Status: AC
  Administered 2021-06-09: 15 mL via OROMUCOSAL
  Filled 2021-06-09: qty 15

## 2021-06-09 MED ORDER — FENTANYL CITRATE (PF) 100 MCG/2ML IJ SOLN
INTRAMUSCULAR | Status: AC
  Filled 2021-06-09: qty 2

## 2021-06-09 MED ORDER — PROPOFOL 10 MG/ML IV BOLUS
INTRAVENOUS | Status: DC | PRN
Start: 2021-06-09 — End: 2021-06-09
  Administered 2021-06-09: 170 mg via INTRAVENOUS

## 2021-06-09 MED ORDER — PHENYLEPHRINE HCL-NACL 20-0.9 MG/250ML-% IV SOLN
INTRAVENOUS | Status: DC | PRN
  Administered 2021-06-09: 50 ug/min via INTRAVENOUS

## 2021-06-09 MED ORDER — FENTANYL CITRATE (PF) 250 MCG/5ML IJ SOLN
INTRAMUSCULAR | Status: AC
  Filled 2021-06-09: qty 5

## 2021-06-09 MED ORDER — DEXAMETHASONE SODIUM PHOSPHATE 10 MG/ML IJ SOLN
INTRAMUSCULAR | Status: AC
  Filled 2021-06-09: qty 1

## 2021-06-09 MED ORDER — DEXAMETHASONE SODIUM PHOSPHATE 10 MG/ML IJ SOLN
INTRAMUSCULAR | Status: DC | PRN
  Administered 2021-06-09: 10 mg via INTRAVENOUS

## 2021-06-09 MED ORDER — 0.9 % SODIUM CHLORIDE (POUR BTL) OPTIME
TOPICAL | Status: DC | PRN
  Administered 2021-06-09: 1000 mL

## 2021-06-09 MED ORDER — LIDOCAINE 2% (20 MG/ML) 5 ML SYRINGE
INTRAMUSCULAR | Status: DC | PRN
Start: 2021-06-09 — End: 2021-06-09
  Administered 2021-06-09: 80 mg via INTRAVENOUS

## 2021-06-09 MED ORDER — ONDANSETRON HCL 4 MG/2ML IJ SOLN
INTRAMUSCULAR | Status: DC | PRN
  Administered 2021-06-09: 4 mg via INTRAVENOUS

## 2021-06-09 MED ORDER — LACTATED RINGERS IV SOLN: INTRAVENOUS | Status: DC

## 2021-06-09 MED ORDER — FENTANYL CITRATE (PF) 100 MCG/2ML IJ SOLN
25.0000 ug | INTRAMUSCULAR | Status: DC | PRN
  Administered 2021-06-09: 50 ug via INTRAVENOUS
  Administered 2021-06-09: 25 ug via INTRAVENOUS
  Administered 2021-06-09: 50 ug via INTRAVENOUS
  Administered 2021-06-09: 25 ug via INTRAVENOUS

## 2021-06-09 MED ORDER — BUPIVACAINE HCL (PF) 0.25 % IJ SOLN
INTRAMUSCULAR | Status: AC
  Filled 2021-06-09: qty 60

## 2021-06-09 MED ORDER — ONDANSETRON HCL 4 MG/2ML IJ SOLN
INTRAMUSCULAR | Status: AC
  Filled 2021-06-09: qty 2

## 2021-06-09 MED ORDER — LACTATED RINGERS IV SOLN
INTRAVENOUS | Status: DC | PRN
  Administered 2021-06-09: 1600 mL

## 2021-06-09 MED ORDER — CEFAZOLIN SODIUM-DEXTROSE 2-4 GM/100ML-% IV SOLN
2.0000 g | INTRAVENOUS | Status: AC
  Administered 2021-06-09: 2 g via INTRAVENOUS
  Filled 2021-06-09: qty 100

## 2021-06-09 MED ORDER — ROCURONIUM BROMIDE 10 MG/ML (PF) SYRINGE
PREFILLED_SYRINGE | INTRAVENOUS | Status: DC | PRN
Start: 2021-06-09 — End: 2021-06-09
  Administered 2021-06-09: 40 mg via INTRAVENOUS
  Administered 2021-06-09: 60 mg via INTRAVENOUS

## 2021-06-09 MED ORDER — SUGAMMADEX SODIUM 200 MG/2ML IV SOLN
INTRAVENOUS | Status: DC | PRN
  Administered 2021-06-09: 200 mg via INTRAVENOUS

## 2021-06-09 MED ORDER — PHENYLEPHRINE 40 MCG/ML (10ML) SYRINGE FOR IV PUSH (FOR BLOOD PRESSURE SUPPORT)
PREFILLED_SYRINGE | INTRAVENOUS | Status: DC | PRN
  Administered 2021-06-09: 200 ug via INTRAVENOUS
  Administered 2021-06-09: 80 ug via INTRAVENOUS
  Administered 2021-06-09: 160 ug via INTRAVENOUS
  Administered 2021-06-09: 200 ug via INTRAVENOUS

## 2021-06-09 SURGICAL SUPPLY — 67 items
APL PRP STRL LF DISP 70% ISPRP (MISCELLANEOUS) ×2
APL SKNCLS STERI-STRIP NONHPOA (GAUZE/BANDAGES/DRESSINGS) ×2
BAG COUNTER SPONGE SURGICOUNT (BAG) ×2 IMPLANT
BAG DECANTER FOR FLEXI CONT (MISCELLANEOUS) ×1 IMPLANT
BAG SPNG CNTER NS LX DISP (BAG) ×2
BENZOIN TINCTURE PRP APPL 2/3 (GAUZE/BANDAGES/DRESSINGS) ×4 IMPLANT
BIOPATCH RED 1 DISK 7.0 (GAUZE/BANDAGES/DRESSINGS) IMPLANT
BLADE SURG 10 STRL SS (BLADE) ×2 IMPLANT
BLADE SURG 15 STRL LF DISP TIS (BLADE) ×1 IMPLANT
BLADE SURG 15 STRL SS (BLADE)
BNDG ELASTIC 6X5.8 VLCR STR LF (GAUZE/BANDAGES/DRESSINGS) ×4 IMPLANT
BNDG GAUZE ELAST 4 BULKY (GAUZE/BANDAGES/DRESSINGS) IMPLANT
CHLORAPREP W/TINT 26 (MISCELLANEOUS) ×4 IMPLANT
CLIP VESOCCLUDE MED 6/CT (CLIP) IMPLANT
DECANTER SPIKE VIAL GLASS SM (MISCELLANEOUS) IMPLANT
DRAIN CHANNEL 15F RND FF W/TCR (WOUND CARE) IMPLANT
DRAIN PENROSE 0.5X18 (DRAIN) ×1 IMPLANT
DRAIN PENROSE 0.75X12 (DRAIN) IMPLANT
DRAIN PENROSE 1/2X12 LTX STRL (WOUND CARE) IMPLANT
DRAIN PENROSE 1/4X12 LTX STRL (WOUND CARE) IMPLANT
DRAPE LAPAROSCOPIC ABDOMINAL (DRAPES) ×2 IMPLANT
DRAPE UTILITY XL STRL (DRAPES) ×1 IMPLANT
DRSG PAD ABDOMINAL 8X10 ST (GAUZE/BANDAGES/DRESSINGS) ×4 IMPLANT
ELECT REM PT RETURN 9FT ADLT (ELECTROSURGICAL) ×2
ELECTRODE REM PT RTRN 9FT ADLT (ELECTROSURGICAL) ×1 IMPLANT
EVACUATOR SILICONE 100CC (DRAIN) IMPLANT
GAUZE SPONGE 4X4 12PLY STRL (GAUZE/BANDAGES/DRESSINGS) ×4 IMPLANT
GAUZE XEROFORM 5X9 LF (GAUZE/BANDAGES/DRESSINGS) ×2 IMPLANT
GLOVE SRG 8 PF TXTR STRL LF DI (GLOVE) IMPLANT
GLOVE SURG ENC MOIS LTX SZ6.5 (GLOVE) IMPLANT
GLOVE SURG ENC MOIS LTX SZ7.5 (GLOVE) ×1 IMPLANT
GLOVE SURG ENC TEXT LTX SZ7.5 (GLOVE) ×2 IMPLANT
GLOVE SURG LTX SZ6.5 (GLOVE) IMPLANT
GLOVE SURG UNDER POLY LF SZ8 (GLOVE) ×2
GOWN STRL REUS W/ TWL LRG LVL3 (GOWN DISPOSABLE) ×2 IMPLANT
GOWN STRL REUS W/TWL LRG LVL3 (GOWN DISPOSABLE) ×8
KIT BASIN OR (CUSTOM PROCEDURE TRAY) ×2 IMPLANT
MARKER SKIN DUAL TIP RULER LAB (MISCELLANEOUS) ×1 IMPLANT
NDL SAFETY ECLIPSE 18X1.5 (NEEDLE) IMPLANT
NDL SPNL 18GX3.5 QUINCKE PK (NEEDLE) ×1 IMPLANT
NEEDLE HYPO 18GX1.5 SHARP (NEEDLE)
NEEDLE SPNL 18GX3.5 QUINCKE PK (NEEDLE) ×2 IMPLANT
NS IRRIG 1000ML POUR BTL (IV SOLUTION) ×2 IMPLANT
PACK GENERAL/GYN (CUSTOM PROCEDURE TRAY) ×2 IMPLANT
PENCIL SMOKE EVACUATOR (MISCELLANEOUS) ×1 IMPLANT
PIN SAFETY STERILE (MISCELLANEOUS) IMPLANT
SHEET MEDIUM DRAPE 40X70 STRL (DRAPES) IMPLANT
SLEEVE SCD COMPRESS KNEE MED (STOCKING) ×1 IMPLANT
SPONGE T-LAP 18X18 ~~LOC~~+RFID (SPONGE) ×5 IMPLANT
STAPLER INSORB 30 2030 C-SECTI (MISCELLANEOUS) ×3 IMPLANT
STAPLER VISISTAT 35W (STAPLE) ×3 IMPLANT
STRIP CLOSURE SKIN 1/2X4 (GAUZE/BANDAGES/DRESSINGS) ×5 IMPLANT
SUT CHROMIC 4 0 PS 2 18 (SUTURE) ×2 IMPLANT
SUT ETHILON 2 0 FS 18 (SUTURE) IMPLANT
SUT ETHILON 3 0 PS 1 (SUTURE) ×2 IMPLANT
SUT MNCRL AB 4-0 PS2 18 (SUTURE) ×3 IMPLANT
SUT PDS AB 2-0 CT2 27 (SUTURE) IMPLANT
SUT PDS AB 3-0 SH 27 (SUTURE) ×4 IMPLANT
SUT VIC AB 3-0 PS1 18 (SUTURE)
SUT VIC AB 3-0 PS1 18XBRD (SUTURE) ×2 IMPLANT
SUT VLOC 90 P-14 23 (SUTURE) ×5 IMPLANT
SYR 50ML LL SCALE MARK (SYRINGE) ×6 IMPLANT
TAPE MEASURE VINYL STERILE (MISCELLANEOUS) IMPLANT
TOWEL GREEN STERILE FF (TOWEL DISPOSABLE) ×2 IMPLANT
TRAY FOLEY W/BAG SLVR 14FR LF (SET/KITS/TRAYS/PACK) IMPLANT
TUBING INFILTRATION IT-10001 (TUBING) ×1 IMPLANT
UNDERPAD 30X36 HEAVY ABSORB (UNDERPADS AND DIAPERS) ×2 IMPLANT

## 2021-06-09 NOTE — Interval H&P Note (Signed)
Patient seen and examined. Risks and benefits discussed. Proceed with surgery.

## 2021-06-09 NOTE — Anesthesia Postprocedure Evaluation (Signed)
Anesthesia Post Note  Patient: [name]  Procedure(s) Performed: MAMMARY REDUCTION  (BREAST) (Bilateral: Breast)     Patient location during evaluation: PACU Anesthesia Type: General Level of consciousness: awake Pain management: pain level controlled Respiratory status: spontaneous breathing Cardiovascular status: stable Postop Assessment: no apparent nausea or vomiting Anesthetic complications: no   No notable events documented.  Last Vitals:  Vitals:   06/09/21 0826 06/09/21 1249  BP: 127/77 117/78  Pulse: 84 (!) 112  Resp: 18 19  Temp: 36.8 C (!) 36.1 C  SpO2: 100% 93%    Last Pain:  Vitals:   06/09/21 1249  TempSrc:   PainSc: 10-Worst pain ever                 Sims Laday

## 2021-06-09 NOTE — Op Note (Signed)
Operative Note   DATE OF OPERATION: 06/09/2021  LOCATION: [facility]   SURGICAL DEPARTMENT: Plastic Surgery  PREOPERATIVE DIAGNOSES: Bilateral symptomatic macromastia and breast asymmetry after breast cancer treatment  POSTOPERATIVE DIAGNOSES:  same  PROCEDURE: Bilateral breast reduction with superomedial pedicle.  SURGEON: Talmadge Coventry, MD  ASSISTANT: Krista Blue, PA The advanced practice practitioner (APP) assisted throughout the case.  The APP was essential in retraction and counter traction when needed to make the case progress smoothly.  This retraction and assistance made it possible to see the tissue planes for the procedure.  The assistance was needed for hemostasis, tissue re-approximation and closure of the incision site.   ANESTHESIA: General.  COMPLICATIONS: None.   INDICATIONS FOR PROCEDURE:  The patient, [name] is a 76 y.o. female born on 01/20/1946, is here for treatment of bilateral symptomatic macromastia. MRN: [id number]  CONSENT:  Informed consent was obtained directly from the patient. Risks, benefits and alternatives were fully discussed. Specific risks including but not limited to bleeding, infection, hematoma, seroma, scarring, pain, infection, contracture, asymmetry, wound healing problems, and need for further surgery were all discussed. The patient did have an ample opportunity to have questions answered to satisfaction.   DESCRIPTION OF PROCEDURE:  The patient was marked preoperatively for a Wise pattern skin excision.  The patient was taken to the operating room. SCDs were placed and antibiotics were given. General anesthesia was administered.The patient's operative site was prepped and draped in a sterile fashion. A time out was performed and all information was confirmed to be correct.  Right Breast: The breast was infiltrated with tumescent solution to help with hemostasis.  The nipple was marked with a cookie cutter.   A superomedial pedicle was drawn out with the base of at least 8 cm in size.  A breast tourniquet was then applied and the pedicle was de-epithelialized.  Breast tourniquet was then let down and all incisions were made with a 10 blade.  The pedicle was then isolated down to the chest wall with cautery and the excision was performed removing tissue primarily inferiorly and laterally.  Hemostasis was obtained and the wound was stapled closed.  Left breast:  The breast was infiltrated with tumescent solution to help with hemostasis.  The nipple was marked with a cookie cutter.  A superomedial pedicle was drawn out with the base of at least 8 cm in size.  A breast tourniquet was then applied and the pedicle was de-epithelialized.  Breast tourniquet was then let down and all incisions were made with a 10 blade.  The pedicle was then isolated down to the chest wall with cautery and the excision was performed removing tissue primarily inferiorly and laterally.  Hemostasis was obtained and the wound was stapled closed.  Patient was then set up to check for size and symmetry.  Minor modifications were made.  This resulted in a total of 995g removed from the right side and 900g removed from the left side.  Penrose drain was left on each side.  The inframammary incision was closed with a combination of buried in-sorb staples and a running 3-0 Quill suture.  The vertical and periareolar limbs were closed with interrupted buried 4-0 Monocryl and a running 4-0 Quill suture.  Steri-Strips were then applied along with a soft dressing and Ace wrap.  The patient tolerated the procedure well.  There were no complications. The patient was allowed to wake from anesthesia, extubated and taken to the recovery room in satisfactory  condition.  I was present for the entire procedure.

## 2021-06-09 NOTE — Anesthesia Procedure Notes (Signed)
Procedure Name: Intubation Date/Time: 06/09/2021 10:15 AM Performed by: Lance Coon, CRNA Pre-anesthesia Checklist: Patient identified, Emergency Drugs available, Suction available, Patient being monitored and Timeout performed Patient Re-evaluated:Patient Re-evaluated prior to induction Oxygen Delivery Method: Circle system utilized Preoxygenation: Pre-oxygenation with 100% oxygen Induction Type: IV induction Ventilation: Mask ventilation without difficulty Laryngoscope Size: Miller and 3 Grade View: Grade I Tube type: Oral Tube size: 7.0 mm Number of attempts: 1 Airway Equipment and Method: Stylet Placement Confirmation: ETT inserted through vocal cords under direct vision, positive ETCO2 and breath sounds checked- equal and bilateral Secured at: 22 cm Tube secured with: Tape Dental Injury: Teeth and Oropharynx as per pre-operative assessment

## 2021-06-09 NOTE — Transfer of Care (Signed)
Immediate Anesthesia Transfer of Care Note  Patient: [name]  Procedure(s) Performed: MAMMARY REDUCTION  (BREAST) (Bilateral: Breast)  Patient Location: PACU  Anesthesia Type:General  Level of Consciousness: drowsy and patient cooperative  Airway & Oxygen Therapy: Patient Spontanous Breathing  Post-op Assessment: Report given to RN and Post -op Vital signs reviewed and stable  Post vital signs: Reviewed and stable  Last Vitals:  Vitals Value Taken Time  BP 117/78 06/09/21 1248  Temp    Pulse 112 06/09/21 1249  Resp 18 06/09/21 1249  SpO2 90 % 06/09/21 1249  Vitals shown include unvalidated device data.  Last Pain:  Vitals:   06/09/21 0913  TempSrc:   PainSc: 0-No pain         Complications: No notable events documented.

## 2021-06-09 NOTE — Discharge Instructions (Addendum)
Activity: Avoid strenuous activity.  No heavy lifting.  Diet: No restrictions.  Try to optimize nutrition with plenty of fruits and vegetables to improve healing.  Wound Care: Leave ACE wrap for 3 days.  You may then remove and shower normally.  After ACE wrap comes off recommend sports bra for gentle compression.  Avoid any bra with under-wire until cleared by Dr. Claudia Desanctis.  Follow-Up: Scheduled for next week.  Things to watch for:  Call the office if you experience fever, chills, persistent nausea, or significant bleeding.  Mild wound drainage is common after breast reduction surgery and should not be cause for alarm.

## 2021-06-10 ENCOUNTER — Encounter (HOSPITAL_COMMUNITY): Payer: Self-pay | Admitting: Plastic Surgery

## 2021-06-11 LAB — SURGICAL PATHOLOGY

## 2021-06-18 ENCOUNTER — Other Ambulatory Visit: Payer: Self-pay

## 2021-06-18 ENCOUNTER — Encounter: Payer: Self-pay | Admitting: Surgical

## 2021-06-18 ENCOUNTER — Ambulatory Visit (INDEPENDENT_AMBULATORY_CARE_PROVIDER_SITE_OTHER): Payer: Medicare Other | Admitting: Plastic Surgery

## 2021-06-18 DIAGNOSIS — C50312 Malignant neoplasm of lower-inner quadrant of left female breast: Secondary | ICD-10-CM

## 2021-06-18 DIAGNOSIS — N6489 Other specified disorders of breast: Secondary | ICD-10-CM

## 2021-06-18 DIAGNOSIS — N1831 Chronic kidney disease, stage 3a: Secondary | ICD-10-CM | POA: Diagnosis not present

## 2021-06-18 NOTE — Progress Notes (Signed)
Surgical Clearance had been received on 05/27/21 from Dr. Pricilla Holm for patient's surgery with Dr. Claudia Desanctis .  Patient was medically cleared.

## 2021-06-18 NOTE — Progress Notes (Signed)
Patient presents about 1 week out from bilateral breast reduction.  She feels good and is happy.  On exam everything looks to be healing fine with intact incisions and viable nipple areolar complexes.  There is no subcutaneous fluid.  I have asked her to continue compressive garments and avoid strenuous activity.  We will see her again in a few weeks.  Penrose drains were removed today.

## 2021-06-23 ENCOUNTER — Telehealth: Payer: Self-pay

## 2021-06-23 NOTE — Telephone Encounter (Signed)
Patient called to say she is getting blisters under her arm and on left breast.  She said she needs a doctor to see her.  Please call.

## 2021-06-23 NOTE — Telephone Encounter (Signed)
Returned patients call.  BL breast reduction with superomedial pedicle was performed on 06/09/2021 with Dr. Claudia Desanctis.  Yesterday, a blister came up under right axillary area along with a blister on medial of left breast Friday.Both blisters look the same. Denies any fever, chills, nausea, vomiting, nor itching. The next follow up appointment with Donna Christen on 07/02/2021. Will call patient back after discussing with PA.

## 2021-06-24 NOTE — Telephone Encounter (Signed)
Spoke with patient this morning, she states that she is feeling improved. Small blister that she suspects related to sweating in her compressive bra at night that has since ruptured and healed over. Offered her to come in for eval, but she feels comfortable given her resolution that she can hold off until next visit.

## 2021-06-25 DIAGNOSIS — N2581 Secondary hyperparathyroidism of renal origin: Secondary | ICD-10-CM | POA: Diagnosis not present

## 2021-06-25 DIAGNOSIS — I129 Hypertensive chronic kidney disease with stage 1 through stage 4 chronic kidney disease, or unspecified chronic kidney disease: Secondary | ICD-10-CM | POA: Diagnosis not present

## 2021-06-25 DIAGNOSIS — N1831 Chronic kidney disease, stage 3a: Secondary | ICD-10-CM | POA: Diagnosis not present

## 2021-06-25 DIAGNOSIS — D631 Anemia in chronic kidney disease: Secondary | ICD-10-CM | POA: Diagnosis not present

## 2021-06-27 ENCOUNTER — Ambulatory Visit (INDEPENDENT_AMBULATORY_CARE_PROVIDER_SITE_OTHER): Payer: Medicare Other | Admitting: Sports Medicine

## 2021-06-27 ENCOUNTER — Other Ambulatory Visit: Payer: Self-pay

## 2021-06-27 ENCOUNTER — Ambulatory Visit (INDEPENDENT_AMBULATORY_CARE_PROVIDER_SITE_OTHER): Payer: Medicare Other

## 2021-06-27 VITALS — HR 85 | Ht 64.0 in

## 2021-06-27 DIAGNOSIS — M79642 Pain in left hand: Secondary | ICD-10-CM

## 2021-06-27 DIAGNOSIS — M25532 Pain in left wrist: Secondary | ICD-10-CM

## 2021-06-27 MED ORDER — MELOXICAM 15 MG PO TABS: 15.0000 mg | ORAL_TABLET | Freq: Every day | ORAL | 0 refills | Status: DC

## 2021-06-27 NOTE — Progress Notes (Signed)
Benito Mccreedy D.[facility] [place] [facility] Phone: [phone]   Assessment and Plan:     1. Left wrist pain -Acute, uncertain prognosis, initial sports medicine visit - Left posterior wrist pain x1 day of unclear etiology.  Differential includes acute flare of carpal bone osteoarthritis versus extensor tendinitis.  No open wounds, or history of gout - X-ray obtained in clinic.  My interpretation: No acute fracture or dislocation.  Contracture of fifth digit at PIP.  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  May use remaining meloxicam as needed once daily for pain control.  Do not to use additional NSAIDs while taking meloxicam.  May use Tylenol 500  mg 2 to 3 times a day for breakthrough pain.  -Patient provided with wrist brace.  Instructed to use throughout the day - DG Hand Complete Left; Future    Pertinent previous records reviewed include none   Follow Up: 1 week for reevaluation   Subjective:   I, [name], am serving as a Education administrator for Doctor Glennon Mac  Chief Complaint: left hand pain   HPI:   06/27/21 Patient is a 76 year old female complaining of left hand pain. Patient states that she might have tenonitis woke up yesterday morning with hand pain , Biofreeze and tylenol were not helping the pain ,no numbness or tingling pain stays in the hand.  Denies fever, chills, nausea, vomiting, syncope, generalized fatigue.  Relevant Historical Information: Hypertension, DM type II, breast cancer  Additional pertinent review of systems negative.   Current Outpatient Medications:    meloxicam (MOBIC) 15 MG tablet, Take 1 tablet (15 mg total) by mouth daily., Disp: 14 tablet, Rfl: 0   albuterol (VENTOLIN HFA) 108 (90 Base) MCG/ACT inhaler, Inhale 2 puffs into the lungs every 6 (six) hours as needed for wheezing or shortness of  breath., Disp: 54 g, Rfl: 4   allopurinol (ZYLOPRIM) 100 MG tablet, TAKE ONE TABLET BY MOUTH EVERY DAY, Disp: 90 tablet, Rfl: 3   amitriptyline (ELAVIL) 50 MG tablet, TAKE ONE TABLET BY MOUTH EVERY DAY AT BEDTIME (USE CAUTION - MAY CAUSE DROWSINESS), Disp: 90 tablet, Rfl: 0   calcium carbonate (TUMS - DOSED IN MG ELEMENTAL CALCIUM) 500 MG chewable tablet, Chew 1 tablet by mouth daily as needed for indigestion or heartburn., Disp: , Rfl:    Calcium Carbonate-Vitamin D (CALCIUM-VITAMIN D) 600-200 MG-UNIT CAPS, Take 1 capsule by mouth daily., Disp: , Rfl:    cholecalciferol (VITAMIN D) 25 MCG (1000 UNIT) tablet, Take 1,000 Units by mouth daily., Disp: , Rfl:    Cyanocobalamin (VITAMIN B 12 PO), Take 1,000 mcg by mouth daily., Disp: , Rfl:    dicyclomine (BENTYL) 20 MG tablet, Take 1 tablet (20 mg total) by mouth 3 (three) times daily as needed for spasms., Disp: 360 tablet, Rfl: 3   enoxaparin (LOVENOX) 40 MG/0.4ML injection, Inject 0.4 mLs (40 mg total) into the skin daily for 7 days. (Patient not taking: Reported on 05/27/2021), Disp: 2.8 mL, Rfl: 0   famotidine (PEPCID) 40 MG tablet, Take 1 tablet (40 mg total) by mouth at bedtime., Disp: 90 tablet, Rfl: 3   ferrous sulfate 325 (65 FE) MG tablet, Take 1 tablet (325 mg total) by mouth daily with breakfast., Disp: , Rfl:    fluticasone (FLONASE) 50 MCG/ACT nasal spray, Place 2 sprays into both nostrils daily. (Patient taking differently: Place  2 sprays into both nostrils daily as needed for allergies or rhinitis.), Disp: 16 g, Rfl: 6   furosemide (LASIX) 40 MG tablet, Take 1 tablet (40 mg total) by mouth daily., Disp: 90 tablet, Rfl: 3   HYDROcodone-acetaminophen (NORCO/VICODIN) 5-325 MG tablet, Take 1 tablet by mouth every 6 (six) hours as needed., Disp: 15 tablet, Rfl: 0   loratadine (CLARITIN) 10 MG tablet, Take 1 tablet (10 mg total) by mouth daily., Disp: 90 tablet, Rfl: 3   lovastatin (MEVACOR) 20 MG tablet, TAKE ONE TABLET BY MOUTH EVERY DAY AT  6:00PM, Disp: 90 tablet, Rfl: 2   nitroGLYCERIN (NITROSTAT) 0.4 MG SL tablet, Place 1 tablet (0.4 mg total) under the tongue every 5 (five) minutes as needed., Disp: 25 tablet, Rfl: 6   ondansetron (ZOFRAN) 4 MG tablet, Take 1 tablet (4 mg total) by mouth every 8 (eight) hours as needed for nausea or vomiting., Disp: 20 tablet, Rfl: 0   OZEMPIC, 0.25 OR 0.5 MG/DOSE, 2 MG/1.5ML SOPN, INJECT 0.25MG  SUBCUTANEOUSLY ONCE A WEEK FOR 28 DAYS THEN 0.5MG  ONCE A WEEK THEREAFTER - (ADMINISTER BY SUBCUTANEOUS INJECTION INTO THE ABDOMEN, THIGH, OR UPPER ARM AT ANY TIME OF DAY ON THE SAME DAY EACH WEEK) DISCARD PEN 56 DAYS AFTER FIRST USE, Disp: 4.5 mL, Rfl: 0   pantoprazole (PROTONIX) 40 MG tablet, TAKE 1 TABLET BY MOUTH 30 MINUTES PRIOR TO BREAKFAST AND SUPPER (Patient not taking: Reported on 06/18/2021), Disp: 180 tablet, Rfl: 1   pregabalin (LYRICA) 75 MG capsule, Take 1 capsule (75 mg total) by mouth 2 (two) times daily. For nerve pain, Disp: 180 capsule, Rfl: 3   SYNTHROID 175 MCG tablet, TAKE ONE TABLET BY MOUTH EVERY DAY BEFORE BREAKFAST., Disp: 90 tablet, Rfl: 1   Objective:     Vitals:   06/27/21 1110  Pulse: 85  SpO2: 99%  Height: 5\' 4"  (1.626 m)      Body mass index is 37.76 kg/m.    Physical Exam:    General: Appears well, nad, nontoxic and pleasant Neuro:sensation intact, strength is 5/5 with df/pf/inv/ev, muscle tone wnl Skin:no susupicious lesions or rashes  Left wrist:  No deformity with moderate posterior wrist swelling appreciated.  No erythema or warmth.  No lacerations or open wounds. ROM  Ext 5, flexion 10, radial/ulnar deviation 5 TTP significantly over posterior wrist nttp over the   volar carpals, radial styloid, ulnar styloid, 1st mcp, tfcc     pain with resisted ext, flex or deviation    Electronically signed by:  Benito Mccreedy D.[facility] 11:35 AM 06/27/21

## 2021-06-27 NOTE — Patient Instructions (Addendum)
Good to see you  Start meloxicam 15 mg daily x2 weeks.  If still having pain after 2 weeks, complete 3rd-week of meloxicam. May use remaining meloxicam as needed once daily for pain control.   Do not to use additional NSAIDs while taking meloxicam.   May use Tylenol 500  mg 1 tablet 2-3 times a day for breakthrough pain. 1 week follow up

## 2021-06-28 ENCOUNTER — Telehealth: Payer: Self-pay | Admitting: Plastic Surgery

## 2021-06-28 NOTE — Telephone Encounter (Signed)
The patient is a 76 year old female who called today because of some drainage from the lateral portion of her breast.  She underwent an oncoplastic breast reduction January 9.  She does not have any fevers or other signs of infection.  Tumescent was used during the procedure so most likely fluid drainage from seroma.  Patient instructed to use sports bra and gauze.  If it changes she knows to call us back.  We will plan to see her Monday at 11:45 in the office for follow-up.

## 2021-06-30 ENCOUNTER — Ambulatory Visit (INDEPENDENT_AMBULATORY_CARE_PROVIDER_SITE_OTHER): Payer: Medicare Other | Admitting: Surgical

## 2021-06-30 ENCOUNTER — Other Ambulatory Visit: Payer: Self-pay

## 2021-06-30 DIAGNOSIS — C50312 Malignant neoplasm of lower-inner quadrant of left female breast: Secondary | ICD-10-CM

## 2021-06-30 DIAGNOSIS — N6489 Other specified disorders of breast: Secondary | ICD-10-CM

## 2021-06-30 NOTE — Progress Notes (Signed)
Patient is a 76 year old female here for follow-up after bilateral breast reduction with Dr. Claudia Desanctis on 06/09/2021.  She presents today for concerns of drainage from her right breast.  She reports that she noticed some fluid draining from her right breast after lifting her arm, she was concerned that this was infected given the drainage appeared yellow to her.  She is otherwise doing well  Chaperone present on exam On exam bilateral NAC's are viable, bilateral breast incisions are intact.  She has 2 small wounds of the bilateral lateral breast incisions where the drains were placed intraoperatively.  These appear to be healing well.  There is no surrounding erythema or cellulitic change.  Good base of granulation tissue is noted.  She has blisters on bilateral lateral breast that are approximately 1 x 1 cm each, no surrounding erythema or cellulitic changes.  They appear to be healing well.  Small amount of subcutaneous fluid collection noted with palpation of the right breast, the incisions are not taut and the breast is not firm.  Mild amount of fat necrosis noted within the left breast with palpation.  No overlying skin changes.  Encourage patient that the bilateral breast wounds where the drain sites were placed intraoperatively are healing well.  Recommend Vaseline and gauze to bilateral breast wounds, bilateral breast blisters.  Recommend following up in the next few weeks for reevaluation.  There is no signs of infection on exam.  Call with questions or concern

## 2021-06-30 NOTE — Progress Notes (Deleted)
Patient is a 76 year old female with PMH of macromastia s/p bilateral breast reduction performed 06/09/2021 by Dr. Claudia Desanctis who presents to clinic for postoperative follow-up.  She was seen here in clinic 06/18/2021 for initial postop.  At that time, exam was entirely reassuring and Penrose drains were removed.

## 2021-07-02 ENCOUNTER — Encounter: Payer: Medicare Other | Admitting: Physician Assistant

## 2021-07-03 NOTE — Progress Notes (Signed)
Benito Mccreedy D.[place] Phone: [phone]   Assessment and Plan:     1. Left wrist pain -Acute, improving, subsequent visit - Significant improvement in left wrist pain, swelling after taking meloxicam 15 mg daily for 1 week - Continue meloxicam 15 mg daily for additional week - May discontinue regular brace use -Of note, I asked patient to follow-up today as a 1 week follow up due to severity of her pain, swelling in the wrist, that seemed out of proportion to typical osteoarthritis flare or tendinitis.  Patient has had significant improvement with using only 1 week of NSAIDs, which supports diagnosis of osteoarthritis acute flare versus tendinitis.  No additional work-up needed at this time  Pertinent previous records reviewed include none   Follow Up: As needed if no improvement or worsening of symptoms   Subjective:   I, Moenique Parris, am serving as a Education administrator for Doctor Glennon Mac  Chief Complaint: left wrist pain   HPI:  06/27/21 Patient is a 76 year old female complaining of left hand pain. Patient states that she might have tenonitis woke up yesterday morning with hand pain , Biofreeze and tylenol were not helping the pain ,no numbness or tingling pain stays in the hand.  Denies fever, chills, nausea, vomiting, syncope, generalized fatigue.  07/04/2021 Patient states that she's doing a lot better swelling has gone down and pain has gone     Relevant Historical Information: Hypertension, DM type II, breast cancer  Additional pertinent review of systems negative.   Current Outpatient Medications:    albuterol (VENTOLIN HFA) 108 (90 Base) MCG/ACT inhaler, Inhale 2 puffs into the lungs every 6 (six) hours as needed for wheezing or shortness of breath., Disp: 54 g, Rfl: 4   allopurinol (ZYLOPRIM) 100 MG tablet, TAKE ONE TABLET BY MOUTH EVERY DAY, Disp: 90 tablet, Rfl: 3   amitriptyline  (ELAVIL) 50 MG tablet, TAKE ONE TABLET BY MOUTH EVERY DAY AT BEDTIME (USE CAUTION - MAY CAUSE DROWSINESS), Disp: 90 tablet, Rfl: 0   calcium carbonate (TUMS - DOSED IN MG ELEMENTAL CALCIUM) 500 MG chewable tablet, Chew 1 tablet by mouth daily as needed for indigestion or heartburn., Disp: , Rfl:    Calcium Carbonate-Vitamin D (CALCIUM-VITAMIN D) 600-200 MG-UNIT CAPS, Take 1 capsule by mouth daily., Disp: , Rfl:    cholecalciferol (VITAMIN D) 25 MCG (1000 UNIT) tablet, Take 1,000 Units by mouth daily., Disp: , Rfl:    Cyanocobalamin (VITAMIN B 12 PO), Take 1,000 mcg by mouth daily., Disp: , Rfl:    dicyclomine (BENTYL) 20 MG tablet, Take 1 tablet (20 mg total) by mouth 3 (three) times daily as needed for spasms., Disp: 360 tablet, Rfl: 3   enoxaparin (LOVENOX) 40 MG/0.4ML injection, Inject 0.4 mLs (40 mg total) into the skin daily for 7 days. (Patient not taking: Reported on 05/27/2021), Disp: 2.8 mL, Rfl: 0   famotidine (PEPCID) 40 MG tablet, Take 1 tablet (40 mg total) by mouth at bedtime., Disp: 90 tablet, Rfl: 3   ferrous sulfate 325 (65 FE) MG tablet, Take 1 tablet (325 mg total) by mouth daily with breakfast., Disp: , Rfl:    fluticasone (FLONASE) 50 MCG/ACT nasal spray, Place 2 sprays into both nostrils daily. (Patient taking differently: Place 2 sprays into both nostrils daily as needed for allergies or rhinitis.), Disp: 16 g, Rfl: 6   furosemide (LASIX) 40 MG tablet, Take 1 tablet (40 mg total) by  mouth daily., Disp: 90 tablet, Rfl: 3   HYDROcodone-acetaminophen (NORCO/VICODIN) 5-325 MG tablet, Take 1 tablet by mouth every 6 (six) hours as needed., Disp: 15 tablet, Rfl: 0   loratadine (CLARITIN) 10 MG tablet, Take 1 tablet (10 mg total) by mouth daily., Disp: 90 tablet, Rfl: 3   lovastatin (MEVACOR) 20 MG tablet, TAKE ONE TABLET BY MOUTH EVERY DAY AT 6:00PM, Disp: 90 tablet, Rfl: 2   meloxicam (MOBIC) 15 MG tablet, Take 1 tablet (15 mg total) by mouth daily., Disp: 14 tablet, Rfl: 0    nitroGLYCERIN (NITROSTAT) 0.4 MG SL tablet, Place 1 tablet (0.4 mg total) under the tongue every 5 (five) minutes as needed. (Patient not taking: Reported on 06/30/2021), Disp: 25 tablet, Rfl: 6   ondansetron (ZOFRAN) 4 MG tablet, Take 1 tablet (4 mg total) by mouth every 8 (eight) hours as needed for nausea or vomiting., Disp: 20 tablet, Rfl: 0   OZEMPIC, 0.25 OR 0.5 MG/DOSE, 2 MG/1.5ML SOPN, INJECT 0.25MG  SUBCUTANEOUSLY ONCE A WEEK FOR 28 DAYS THEN 0.5MG  ONCE A WEEK THEREAFTER - (ADMINISTER BY SUBCUTANEOUS INJECTION INTO THE ABDOMEN, THIGH, OR UPPER ARM AT ANY TIME OF DAY ON THE SAME DAY EACH WEEK) DISCARD PEN 56 DAYS AFTER FIRST USE, Disp: 4.5 mL, Rfl: 0   pantoprazole (PROTONIX) 40 MG tablet, TAKE 1 TABLET BY MOUTH 30 MINUTES PRIOR TO BREAKFAST AND SUPPER, Disp: 180 tablet, Rfl: 1   pregabalin (LYRICA) 75 MG capsule, Take 1 capsule (75 mg total) by mouth 2 (two) times daily. For nerve pain, Disp: 180 capsule, Rfl: 3   SYNTHROID 175 MCG tablet, TAKE ONE TABLET BY MOUTH EVERY DAY BEFORE BREAKFAST., Disp: 90 tablet, Rfl: 1   Objective:     Vitals:   07/04/21 1118  BP: 122/78  Pulse: 69  SpO2: 99%  Weight: 214 lb (97.1 kg)  Height: 5\' 4"  (1.626 m)      Body mass index is 36.73 kg/m.    Physical Exam:    General: Appears well, nad, nontoxic and pleasant Neuro:sensation intact, strength is 5/5 with df/pf/inv/ev, muscle tone wnl Skin:no susupicious lesions or rashes   Left wrist:  No deformity with minimal posterior wrist swelling appreciated.  No erythema or warmth.  No lacerations or open wounds. ROM  Ext 80, flexion 60, radial/ulnar deviation 20 TTP minimally over posterior wrist nttp over the   volar carpals, radial styloid, ulnar styloid, 1st mcp, tfcc    No pain with resisted ext, flex or deviation   Electronically signed by:  Benito Mccreedy D.[facility] 11:44 AM 07/04/21

## 2021-07-04 ENCOUNTER — Telehealth: Payer: Self-pay

## 2021-07-04 ENCOUNTER — Other Ambulatory Visit: Payer: Self-pay

## 2021-07-04 ENCOUNTER — Ambulatory Visit (INDEPENDENT_AMBULATORY_CARE_PROVIDER_SITE_OTHER): Payer: Medicare Other | Admitting: Sports Medicine

## 2021-07-04 VITALS — BP 122/78 | HR 69 | Ht 64.0 in | Wt 214.0 lb

## 2021-07-04 DIAGNOSIS — E890 Postprocedural hypothyroidism: Secondary | ICD-10-CM

## 2021-07-04 DIAGNOSIS — M25532 Pain in left wrist: Secondary | ICD-10-CM | POA: Diagnosis not present

## 2021-07-04 MED ORDER — LOVASTATIN 20 MG PO TABS: ORAL_TABLET | ORAL | 0 refills | Status: DC

## 2021-07-04 MED ORDER — AMITRIPTYLINE HCL 50 MG PO TABS: ORAL_TABLET | ORAL | 0 refills | Status: DC

## 2021-07-04 MED ORDER — FERROUS SULFATE 325 (65 FE) MG PO TABS: 325.0000 mg | ORAL_TABLET | Freq: Every day | ORAL | 0 refills | Status: DC

## 2021-07-04 MED ORDER — LEVOTHYROXINE SODIUM 175 MCG PO TABS: ORAL_TABLET | ORAL | 0 refills | Status: DC

## 2021-07-04 NOTE — Telephone Encounter (Signed)
Pt is calling requesting a refill for: lovastatin (MEVACOR) 20 MG tablet furosemide (LASIX) 40 MG tablet (Expired ferrous sulfate 325 (65 FE) MG tablet amitriptyline (ELAVIL) 50 MG tablet  Pharmacy: [facility] - [place], [place] - [address]  LOV 02/13/21  Pt scheduled F/U today for 07/08/21

## 2021-07-04 NOTE — Telephone Encounter (Signed)
Refills have been sent to the pt's mail order pharmacy.

## 2021-07-04 NOTE — Patient Instructions (Addendum)
Good to see you  Follow up as needed  

## 2021-07-08 ENCOUNTER — Ambulatory Visit (INDEPENDENT_AMBULATORY_CARE_PROVIDER_SITE_OTHER): Payer: Medicare Other | Admitting: Internal Medicine

## 2021-07-08 ENCOUNTER — Other Ambulatory Visit: Payer: Self-pay

## 2021-07-08 ENCOUNTER — Encounter: Payer: Self-pay | Admitting: Internal Medicine

## 2021-07-08 DIAGNOSIS — M109 Gout, unspecified: Secondary | ICD-10-CM

## 2021-07-08 DIAGNOSIS — E785 Hyperlipidemia, unspecified: Secondary | ICD-10-CM

## 2021-07-08 DIAGNOSIS — M1A9XX0 Chronic gout, unspecified, without tophus (tophi): Secondary | ICD-10-CM

## 2021-07-08 DIAGNOSIS — E119 Type 2 diabetes mellitus without complications: Secondary | ICD-10-CM | POA: Diagnosis not present

## 2021-07-08 DIAGNOSIS — E559 Vitamin D deficiency, unspecified: Secondary | ICD-10-CM | POA: Diagnosis not present

## 2021-07-08 MED ORDER — FUROSEMIDE 40 MG PO TABS
40.0000 mg | ORAL_TABLET | Freq: Every day | ORAL | 3 refills | Status: DC
Start: 2021-07-08 — End: 2021-08-07

## 2021-07-08 MED ORDER — LORATADINE 10 MG PO TABS
10.0000 mg | ORAL_TABLET | Freq: Every day | ORAL | 3 refills | Status: DC
Start: 2021-07-08 — End: 2021-08-07

## 2021-07-08 MED ORDER — FLUTICASONE PROPIONATE 50 MCG/ACT NA SUSP: 2.0000 | Freq: Every day | NASAL | 3 refills | Status: DC

## 2021-07-08 MED ORDER — ALLOPURINOL 100 MG PO TABS: 100.0000 mg | ORAL_TABLET | Freq: Every day | ORAL | 3 refills | Status: DC

## 2021-07-08 MED ORDER — TRULICITY 3 MG/0.5ML ~~LOC~~ SOAJ: 3.0000 mg | SUBCUTANEOUS | 3 refills | Status: DC

## 2021-07-08 MED ORDER — AMITRIPTYLINE HCL 50 MG PO TABS: ORAL_TABLET | ORAL | 3 refills | Status: DC

## 2021-07-08 MED ORDER — LOVASTATIN 20 MG PO TABS: ORAL_TABLET | ORAL | 3 refills | Status: DC

## 2021-07-08 NOTE — Progress Notes (Signed)
° °  Subjective:   Patient ID: [name], female    DOB: 16-Mar-1946, 76 y.o.   MRN: [id number]  HPI The patient is a 76 YO female coming in for follow up.  Review of Systems  Constitutional: Negative.   HENT: Negative.    Eyes: Negative.   Respiratory:  Negative for cough, chest tightness and shortness of breath.   Cardiovascular:  Negative for chest pain, palpitations and leg swelling.  Gastrointestinal:  Negative for abdominal distention, abdominal pain, constipation, diarrhea, nausea and vomiting.  Musculoskeletal: Negative.   Skin: Negative.   Neurological: Negative.   Psychiatric/Behavioral: Negative.     Objective:  Physical Exam Constitutional:      Appearance: She is well-developed.  HENT:     Head: Normocephalic and atraumatic.  Cardiovascular:     Rate and Rhythm: Normal rate and regular rhythm.  Pulmonary:     Effort: Pulmonary effort is normal. No respiratory distress.     Breath sounds: Normal breath sounds. No wheezing or rales.  Abdominal:     General: Bowel sounds are normal. There is no distension.     Palpations: Abdomen is soft.     Tenderness: There is no abdominal tenderness. There is no rebound.  Musculoskeletal:     Cervical back: Normal range of motion.  Skin:    General: Skin is warm and dry.  Neurological:     Mental Status: She is alert and oriented to person, place, and time.     Coordination: Coordination normal.    Vitals:   07/08/21 1036  BP: 124/74  Pulse: 81  Resp: 18  SpO2: 99%  Weight: 215 lb 12.8 oz (97.9 kg)  Height: 5\' 4"  (1.626 m)    This visit occurred during the SARS-CoV-2 public health emergency.  Safety protocols were in place, including screening questions prior to the visit, additional usage of staff PPE, and extensive cleaning of exam room while observing appropriate contact time as indicated for disinfecting solutions.   Assessment & Plan:

## 2021-07-08 NOTE — Patient Instructions (Signed)
We do not need blood work today. We have sent in the refills.

## 2021-07-09 ENCOUNTER — Ambulatory Visit: Payer: Medicare Other | Admitting: Internal Medicine

## 2021-07-12 NOTE — Assessment & Plan Note (Signed)
Needs refill on allopurinol which is done today.

## 2021-07-12 NOTE — Assessment & Plan Note (Signed)
Her pharmacy is not able to get ozempic and she needs change. Rx trulicity.

## 2021-07-18 NOTE — Progress Notes (Signed)
HeaHPI F former smoker ( 20 pk yr, quit 1980) referred for shortness of breath by Dr Acharya/ Cardiology for eval of Dyspnea and wheezing, complicated by HTN, Hiatal Hernia, GERD/ stricture, Gatroparesis, Hx C.diff colitis, Goiter/ Hypothyroid p RAI, DM2, Eczema, Breast Cancer L/ chemo/ XRT, FE def Anemia, Renal Insuficiency,  ECHO- nl w mild AR,  Negative ischemia w/u w non-obstructive CAD on CCTA,  PFT 03/22/20- minimal obstruction possible, FEV1/FVC 0.80, insignificant response to bronchodilator, mild Diffusion deficit Overnight Oximetry 03/07/20- O2 sat was 5% or more below avg for at least 5 cumulative minutes. (7 min 44 sec), Qualifying for sleep O2 Group 1 alternate. ==========================================================   01/06/21- 63 yoF former smoker ( 20 pk yr, quit 1980) referred for shortness of breath by Dr Acharya/ Cardiology for eval of Dyspnea and wheezing, complicated by HTN, Hiatal Hernia, GERD/ stricture, Gastroparesis, Hx C.diff colitis, Goiter/ Hypothyroid p RAI, DM2, Eczema, Breast Cancer L/ chemo/ XRT, FE def Anemia, Renal Insuficiency, Aortic Atherosclerosis,  O2 2L sleep and portable/ [employer] ECHO- nl w mild AR,  PFT 03/22/20- minimal obstruction possible, FEV1/FVC 0.80, insignificant response to bronchodilator, mild Diffusion deficit Anemia chronic- Hgb 10.2.Nl indices - ProAirHFA, Flonase,      Semaglutide Recent Hgb still 10.1,  CTa chest/ aorta- 08/28/20- no acute findings Covid vax- 4 Phizer Hears chest rattle, worse at night lying down, not relieved by inhaler. Doesn't know names of meds- dropped off Trelegy. I think she will do better with nebulizer machine.   07/21/21- 83 yoF former smoker ( 20 pk yr, quit 1980) referred for shortness of breath by Dr Acharya/ Cardiology for eval of Dyspnea and wheezing, complicated by HTN, Hiatal Hernia, GERD/ stricture, Gastroparesis, Hx C.diff colitis, Goiter/ Hypothyroid p RAI, DM2, Eczema, Breast Cancer L/ chemo/ XRT, FE def  Anemia, Renal Insuficiency, Aortic Atherosclerosis,  Covid vax- 4 Phizer Flu vax- had O2 2L sleep and portable/ [employer] ECHO- nl w mild AR,  PFT 03/22/20- minimal obstruction possible, FEV1/FVC 0.80, insignificant response to bronchodilator, mild Diffusion deficit Anemia chronic- Hgb 10.2.Nl indices - ProAirHFA, Flonase,      Semaglutide -----Patient feels good, no concerns Not using O2- ordered in 2021. Uses Proair 1-2x/ week for DOE on stairs. Not wheezing or coughing and no acute event. Followed by Cardiology because she says XRT might have hurt her heart. CXR 01/06/21- IMPRESSION: No acute abnormalities. Aortic Atherosclerosis (ICD10-I70.0).  ROS-see HPI   + = positive Constitutional:    weight loss, night sweats, fevers, chills, +fatigue, lassitude. HEENT:    headaches, difficulty swallowing, tooth/dental problems, sore throat,       sneezing, itching, ear ache, nasal congestion, post nasal drip, snoring CV:    chest pain, orthopnea, PND, +swelling in lower extremities, anasarca,                                   dizziness, palpitations Resp:  + shortness of breath with exertion or at rest.                productive cough,   non-productive cough, coughing up of blood.              change in color of mucus.  +wheezing.   Skin:    rash or lesions. GI:  + heartburn, indigestion, abdominal pain, nausea, vomiting, diarrhea,                 change in bowel habits, loss  of appetite GU: dysuria, change in color of urine, no urgency or frequency.   flank pain. MS:   joint pain, stiffness, decreased range of motion, back pain. Neuro-     nothing unusual Psych:  change in mood or affect.  depression or anxiety.   memory loss.  OBJ- Physical Exam General- Alert, Oriented, Affect-appropriate, Distress- none acute, + obese Skin- rash-none, lesions- none, excoriation- none Lymphadenopathy- none Head- atraumatic            Eyes- Gross vision intact, PERRLA, conjunctivae and secretions clear             Ears- Hearing, canals-normal            Nose- Clear, no-Septal dev, mucus, polyps, erosion, perforation             Throat- Mallampati III-IV , mucosa clear , drainage- none, tonsils- atrophic, + teeth Neck- flexible , trachea midline, no stridor , thyroid nl, carotid no bruit Chest - symmetrical excursion , unlabored           Heart/CV- RRR , no murmur , no gallop  , no rub, nl s1 s2                           -JVD-none , edema-+1 feet, stasis changes- none, varices- none           Lung- clear to P&A, wheeze- none, cough- none , dullness-none, rub- none           Chest wall-  Abd-  Br/ Gen/ Rectal- Not done, not indicated Neuro- grossly intact to observation. +Question mild memory deficit.

## 2021-07-18 NOTE — Progress Notes (Addendum)
Patient is a 76 year old female s/p bilateral breast reduction performed 06/09/2021 by Dr. Claudia Desanctis who presents to clinic for postoperative follow-up.  She was last seen here in clinic on 06/30/2021.  At that time, she complained of drainage from her right breast.  Blisters were noted on lateral aspect of breast bilaterally, approximately 1 x 1 cm each.  Patient was encouraged to apply Vaseline and gauze to the wounds and follow-up in a few weeks.  Today, patient complains of bleeding from wounds at superior T-zone's bilaterally that occurred yesterday.  She did not see any purulence.  She denies any recent illness, fevers, or chills.  She states that her right breast was having discomfort, but no pain in the left breast.  Left breast however is firm, particularly over inferior aspect.  She continues to apply Vaseline and gauze daily.  On physical exam, right breast appears slightly more ptotic than left breast.  Inframammary incisions have healed nicely over each breast.  The right breast has a 2 x 1 cm wound at superior T-zone that is largely superficial, approximately 0.25 cm depth.  No obvious protruding sutures or staples.  No surrounding skin changes.  Left breast has firmness and skin darkening inferiorly.  No obvious subcutaneous fluid collection noted, instead feels more comfortable to large area of fat necrosis.  No redness or tenderness on exam.  No active drainage from wounds bilaterally.  Strataderm sample was applied here in office to her wounds bilaterally.  This was then covered with gauze and secured by her compressive bra.  Recommending continued Vaseline and gauze daily.  Anticipate that the wound will ultimately heal.  Her left breast is more ptotic, but this was the case preoperatively.  Recommending that she gently massage the inferior aspect of her left breast, but suspect this is related to her history of radiation to that side.  Lower suspicion for infection given lack of any pain or  tenderness as well as any redness, purulent drainage, or systemic symptoms.  Return in 3 weeks.  Picture(s) obtained of the patient and placed in the chart were with the patient's or guardian's permission.

## 2021-07-21 ENCOUNTER — Other Ambulatory Visit: Payer: Self-pay

## 2021-07-21 ENCOUNTER — Ambulatory Visit (INDEPENDENT_AMBULATORY_CARE_PROVIDER_SITE_OTHER): Payer: Medicare Other | Admitting: Internal Medicine

## 2021-07-21 ENCOUNTER — Encounter: Payer: Self-pay | Admitting: Internal Medicine

## 2021-07-21 VITALS — BP 130/82 | HR 80 | Temp 98.3°F | Ht 65.0 in | Wt 216.4 lb

## 2021-07-21 DIAGNOSIS — R0609 Other forms of dyspnea: Secondary | ICD-10-CM

## 2021-07-21 DIAGNOSIS — G4734 Idiopathic sleep related nonobstructive alveolar hypoventilation: Secondary | ICD-10-CM | POA: Diagnosis not present

## 2021-07-21 MED ORDER — ALBUTEROL SULFATE HFA 108 (90 BASE) MCG/ACT IN AERS: 2.0000 | INHALATION_SPRAY | Freq: Four times a day (QID) | RESPIRATORY_TRACT | 4 refills | Status: DC | PRN

## 2021-07-21 NOTE — Patient Instructions (Addendum)
Order- overnight oximetry on room air  dx dyspnea on exertion  Your albuterol rescue inhaler was refilled  Try to walk and stay active to build your endurance.  Please call if we can help

## 2021-07-22 ENCOUNTER — Telehealth: Payer: Self-pay

## 2021-07-22 DIAGNOSIS — N6489 Other specified disorders of breast: Secondary | ICD-10-CM

## 2021-07-22 DIAGNOSIS — C50312 Malignant neoplasm of lower-inner quadrant of left female breast: Secondary | ICD-10-CM

## 2021-07-22 NOTE — Telephone Encounter (Signed)
Patient called to say she woke up this morning and her breasts were bleeding.  She said her right breast is bleeding more than the left one.  Patient said they are bleeding at the incision points.  She said she has an appointment tomorrow, but she thinks she needs to be seen today.  Please call.

## 2021-07-22 NOTE — Telephone Encounter (Signed)
See previous telephone note. 

## 2021-07-22 NOTE — Telephone Encounter (Signed)
Patient is a 76 year old female who called the office today stating that she had noticed some bleeding from her bilateral breast, she reports that the bleeding seems to be from the underside of her breast.  She reports that she has applied Vaseline gauze to the area and it is now under control and she is not having any further issues.  She is requesting an appointment for today.  She has an appointment for tomorrow morning at 9 AM.  I discussed with the patient that based on what she is describing I agree that she should continue with Vaseline and gauze and we can evaluate her further in the morning to see if anything additional needs to be done.  She was advised that if she had any further issues that she was concerned about to be evaluated in an urgent care.  She also reported that she felt one of her breasts felt firmer than the other, and that she previously had some yellowish drainage from one of her breasts and that concerned her.

## 2021-07-23 ENCOUNTER — Encounter: Payer: Self-pay | Admitting: Physician Assistant

## 2021-07-23 ENCOUNTER — Other Ambulatory Visit: Payer: Self-pay

## 2021-07-23 ENCOUNTER — Ambulatory Visit (INDEPENDENT_AMBULATORY_CARE_PROVIDER_SITE_OTHER): Payer: Medicare Other | Admitting: Physician Assistant

## 2021-07-23 DIAGNOSIS — C50312 Malignant neoplasm of lower-inner quadrant of left female breast: Secondary | ICD-10-CM

## 2021-07-28 DIAGNOSIS — R0683 Snoring: Secondary | ICD-10-CM | POA: Diagnosis not present

## 2021-07-28 DIAGNOSIS — G4730 Sleep apnea, unspecified: Secondary | ICD-10-CM | POA: Diagnosis not present

## 2021-07-29 ENCOUNTER — Telehealth: Payer: Self-pay | Admitting: Family Medicine

## 2021-07-29 MED ORDER — PREGABALIN 75 MG PO CAPS: 75.0000 mg | ORAL_CAPSULE | Freq: Two times a day (BID) | ORAL | 3 refills | Status: DC

## 2021-07-29 NOTE — Telephone Encounter (Signed)
Pt requesting refill of Lyrica to [facility].

## 2021-07-29 NOTE — Telephone Encounter (Signed)
Form mailed and pt notified via phone.

## 2021-07-29 NOTE — Telephone Encounter (Signed)
Prescription printed and will be mailed to [facility]

## 2021-07-30 ENCOUNTER — Encounter: Payer: Self-pay | Admitting: Internal Medicine

## 2021-07-30 NOTE — Assessment & Plan Note (Signed)
BMI 36. Discussed likelihood that obesity is contributing to her desaturation during sleep with obesity hypoventilation syndrome. ?Weight loss encouraged.  ?

## 2021-07-30 NOTE — Assessment & Plan Note (Signed)
Agrees to update overnight oximetry so we can assess O2 need ?Plan- overnight oximetry ?

## 2021-08-07 ENCOUNTER — Other Ambulatory Visit: Payer: Self-pay | Admitting: Nurse Practitioner

## 2021-08-07 ENCOUNTER — Encounter: Payer: Self-pay | Admitting: Nurse Practitioner

## 2021-08-07 ENCOUNTER — Other Ambulatory Visit: Payer: Self-pay

## 2021-08-07 ENCOUNTER — Telehealth: Payer: Self-pay

## 2021-08-07 ENCOUNTER — Ambulatory Visit (INDEPENDENT_AMBULATORY_CARE_PROVIDER_SITE_OTHER): Payer: Medicare Other | Admitting: Nurse Practitioner

## 2021-08-07 VITALS — BP 116/78 | HR 85 | Temp 97.7°F | Ht 65.0 in | Wt 207.0 lb

## 2021-08-07 DIAGNOSIS — M109 Gout, unspecified: Secondary | ICD-10-CM

## 2021-08-07 DIAGNOSIS — J302 Other seasonal allergic rhinitis: Secondary | ICD-10-CM | POA: Diagnosis not present

## 2021-08-07 DIAGNOSIS — E876 Hypokalemia: Secondary | ICD-10-CM

## 2021-08-07 LAB — COMPREHENSIVE METABOLIC PANEL
ALT: 40 U/L — ABNORMAL HIGH (ref 0–35)
AST: 51 U/L — ABNORMAL HIGH (ref 0–37)
Albumin: 4.1 g/dL (ref 3.5–5.2)
Alkaline Phosphatase: 107 U/L (ref 39–117)
BUN: 23 mg/dL (ref 6–23)
CO2: 30 mEq/L (ref 19–32)
Calcium: 7.1 mg/dL — ABNORMAL LOW (ref 8.4–10.5)
Chloride: 98 mEq/L (ref 96–112)
Creatinine, Ser: 1.25 mg/dL — ABNORMAL HIGH (ref 0.40–1.20)
GFR: 41.62 mL/min — ABNORMAL LOW (ref >60.00)
Glucose, Bld: 86 mg/dL (ref 70–99)
Potassium: 2.8 mEq/L — CL (ref 3.5–5.1)
Sodium: 142 mEq/L (ref 135–145)
Total Bilirubin: 0.9 mg/dL (ref 0.2–1.2)
Total Protein: 8.6 g/dL — ABNORMAL HIGH (ref 6.0–8.3)

## 2021-08-07 LAB — URIC ACID: Uric Acid, Serum: 8.3 mg/dL — ABNORMAL HIGH (ref 2.4–7.0)

## 2021-08-07 MED ORDER — LORATADINE 10 MG PO TABS: 10.0000 mg | ORAL_TABLET | Freq: Every day | ORAL | 1 refills | Status: DC

## 2021-08-07 MED ORDER — POTASSIUM CHLORIDE CRYS ER 15 MEQ PO TBCR: 15.0000 meq | EXTENDED_RELEASE_TABLET | Freq: Two times a day (BID) | ORAL | 0 refills | Status: DC

## 2021-08-07 NOTE — Telephone Encounter (Signed)
CRITICAL VALUE STICKER ? ?CRITICAL VALUE: potassium 2.8 ? ?RECEIVER (on-site recipient of call): Verdis Frederickson ? ?DATE & TIME NOTIFIED: 08/07/2021 @ 4:30pm ? ?MESSENGER (representative from lab): Hope ? ?MD NOTIFIED: Yes ? ?TIME OF NOTIFICATION: 4:32pm ? ?RESPONSE: Provider will contact pt ?

## 2021-08-07 NOTE — Assessment & Plan Note (Signed)
History does seem consistent with possible gout flare.  Will check uric acid and metabolic panel today for further evaluation.  May consider increasing dose of allopurinol based on these results.  We discussed possibly doing ultrasound of the left upper extremity to rule out DVT, however because the swelling seems to come and go and is associated with tenderness think this is less likely but may consider this if uric acid level comes back low.  No obvious signs of infection noted on exam today. ?

## 2021-08-07 NOTE — Progress Notes (Signed)
Blood work has resulted and shows hypokalemia with acute kidney injury.  I have spoke with backup supervising physician (Dr. Ronnald Ramp), and we will plan on stopping patient's Lasix and starting her on potassium chloride 15 mEq by mouth twice a day.  I will have her follow-up next week for close monitoring at which point we will recheck a blood work, and make further recommendations at that point.  Uric acid was also elevated but will focus on normalizing potassium and then will treat gout.  Patient will be notified of recommendations. ?

## 2021-08-07 NOTE — Progress Notes (Signed)
Subjective:  Patient ID: [name], female    DOB: [birth year]  Age: 76 y.o. MRN: [id number]  CC:  Chief Complaint  Patient presents with   Edema    2 weeks ago pt ate fish and notes her whole left hand swelled up, unable to move it. Sunday pt ate a pork chop and her left hand swelled up again. Was prescribed dx from sports medicine which seemed to help some.      HPI  This patient arrives today for the above.  Initially symptoms started about 2 weeks ago.  She tells me she ate some fish next day experienced swelling in her left hand and some significant tenderness.  Eventually the swelling resolved on its own.  She did get evaluation by sports medicine regarding this and they did prescribe her meloxicam as well as did an x-ray.  No acute abnormalities noted on x-ray.  She tells me the meloxicam did help with her pain.  About 4 days ago she had an additional swelling event after eating a pork chop.  Swelling eventually did resolve on its own again.  No known possible source of infection, or trauma to the hand.  Per chart review I do see that she has a history of gout.  She is on allopurinol 100 mg by mouth daily and is tolerating this well.  She is concerned she may be having gout flares.  She is also asking for refill of Claritin for which she takes for seasonal allergies.  Past Medical History:  Diagnosis Date   Allergy    ANEMIA, IRON DEFICIENCY 05/08/2009   Angina    ASYMPTOMATIC POSTMENOPAUSAL STATUS 10/11/2008   Blood transfusion    Blood transfusion without reported diagnosis    Breast cancer ([place]) 09/29/2011   invasive grade III ductal ca,assoc high grade dcis,ER/PR=neg; left breast   C. difficile colitis    Cataract    Diverticulosis of colon (without mention of hemorrhage)    Esophageal reflux 06/12/2008   Gastroparesis    GOITER, MULTINODULAR 04/02/2009   Gout, unspecified    H/O hiatal hernia    History of kidney stones    History of lower GI bleeding     History of radiation therapy 02/08/12-03/25/12   left breast,total 61gy   Hypokalemia 05/11/2013   Hypomagnesemia    HYPOTHYROIDISM, POST-RADIATION 08/13/2009   Internal hemorrhoids without mention of complication    Kidney stones    "several"   Leukopenia    Migraines    Obesity    On supplemental oxygen therapy    at night while sleeping - patient did not want to undergo sleep study   Osteoarthrosis, unspecified whether generalized or localized, unspecified site    Other and unspecified hyperlipidemia    Personal history of chemotherapy 2013   Personal history of radiation therapy 2013   left   PONV (postoperative nausea and vomiting)    PUD (peptic ulcer disease)    Short bowel syndrome    Shortness of breath on exertion    "sometimes"   Stricture and stenosis of esophagus    Thyrotoxicosis without mention of goiter or other cause, without mention of thyrotoxic crisis or storm    Type II or unspecified type diabetes mellitus without mention of complication, not stated as uncontrolled    no med in years diet controled   Unspecified essential hypertension    UTI (urinary tract infection)    Varicose veins    VITAMIN B12 DEFICIENCY 08/30/2009  Family History  Problem Relation Age of Onset   Esophageal cancer Son        deceased   Breast cancer Son        esophageal   Diabetes Mother    Heart disease Mother    Kidney disease Sister    Diabetes Father    Hypertension Father    Kidney disease Brother        x 3   Colon cancer Paternal Uncle    Breast cancer Maternal Aunt    Breast cancer Paternal Aunt    Breast cancer Cousin        Pt states she has 15+ cousins w/ Breast CA   Rectal cancer Neg Hx    Stomach cancer Neg Hx     Social History   Social History Narrative   Not on file   Social History   Tobacco Use   Smoking status: Former    Packs/day: 1.00    Years: 10.00    Pack years: 10.00    Types: Cigarettes    Quit date: 09/22/1985    Years  since quitting: 35.8   Smokeless tobacco: Never  Substance Use Topics   Alcohol use: No     Current Meds  Medication Sig   albuterol (VENTOLIN HFA) 108 (90 Base) MCG/ACT inhaler Inhale 2 puffs into the lungs every 6 (six) hours as needed for wheezing or shortness of breath.   allopurinol (ZYLOPRIM) 100 MG tablet Take 1 tablet (100 mg total) by mouth daily.   amitriptyline (ELAVIL) 50 MG tablet TAKE ONE TABLET BY MOUTH EVERY DAY AT BEDTIME (USE CAUTION - MAY CAUSE DROWSINESS)   calcium carbonate (TUMS - DOSED IN MG ELEMENTAL CALCIUM) 500 MG chewable tablet Chew 1 tablet by mouth daily as needed for indigestion or heartburn.   Calcium Carbonate-Vitamin D (CALCIUM-VITAMIN D) 600-200 MG-UNIT CAPS Take 1 capsule by mouth daily.   cholecalciferol (VITAMIN D) 25 MCG (1000 UNIT) tablet Take 1,000 Units by mouth daily.   Cyanocobalamin (VITAMIN B 12 PO) Take 1,000 mcg by mouth daily.   dicyclomine (BENTYL) 20 MG tablet Take 1 tablet (20 mg total) by mouth 3 (three) times daily as needed for spasms.   Dulaglutide (TRULICITY) 3 IO/9.6EX SOPN Inject 3 mg as directed once a week.   famotidine (PEPCID) 40 MG tablet Take 1 tablet (40 mg total) by mouth at bedtime.   ferrous sulfate 325 (65 FE) MG tablet Take 1 tablet (325 mg total) by mouth daily with breakfast.   fluticasone (FLONASE) 50 MCG/ACT nasal spray Place 2 sprays into both nostrils daily.   furosemide (LASIX) 40 MG tablet Take 1 tablet (40 mg total) by mouth daily.   HYDROcodone-acetaminophen (NORCO/VICODIN) 5-325 MG tablet Take 1 tablet by mouth every 6 (six) hours as needed.   levothyroxine (SYNTHROID) 175 MCG tablet TAKE ONE TABLET BY MOUTH EVERY DAY BEFORE BREAKFAST.   lovastatin (MEVACOR) 20 MG tablet TAKE ONE TABLET BY MOUTH EVERY DAY AT 6:00PM   meloxicam (MOBIC) 15 MG tablet Take 1 tablet (15 mg total) by mouth daily.   nitroGLYCERIN (NITROSTAT) 0.4 MG SL tablet Place 1 tablet (0.4 mg total) under the tongue every 5 (five) minutes as  needed.   ondansetron (ZOFRAN) 4 MG tablet Take 1 tablet (4 mg total) by mouth every 8 (eight) hours as needed for nausea or vomiting.   pantoprazole (PROTONIX) 40 MG tablet TAKE 1 TABLET BY MOUTH 30 MINUTES PRIOR TO BREAKFAST AND SUPPER   pregabalin (LYRICA) 75  MG capsule Take 1 capsule (75 mg total) by mouth 2 (two) times daily. For nerve pain    ROS:  Review of Systems  Constitutional:  Negative for fever.  Cardiovascular:  Positive for orthopnea (intermittently) and leg swelling (intermittently, but "not recently"). Negative for chest pain.  Musculoskeletal:  Positive for joint pain.    Objective:   Today's Vitals: BP 116/78 (BP Location: Left Arm, Patient Position: Sitting, Cuff Size: Large)    Pulse 85    Temp 97.7 F (36.5 C) (Oral)    Ht 5' 5"  (1.651 m)    Wt 207 lb (93.9 kg)    SpO2 98%    BMI 34.45 kg/m  Vitals with BMI 08/07/2021 07/21/2021 07/08/2021  Height 5' 5"  5' 5"  5' 4"   Weight 207 lbs 216 lbs 6 oz 215 lbs 13 oz  BMI 34.45 44.96 75.91  Systolic 638 466 599  Diastolic 78 82 74  Pulse 85 80 81     Physical Exam Vitals reviewed.  Constitutional:      General: She is not in acute distress.    Appearance: Normal appearance.  HENT:     Head: Normocephalic and atraumatic.  Neck:     Vascular: No carotid bruit.  Cardiovascular:     Rate and Rhythm: Normal rate and regular rhythm.     Pulses: Normal pulses.     Heart sounds: Normal heart sounds.  Pulmonary:     Effort: Pulmonary effort is normal.     Breath sounds: Normal breath sounds.  Musculoskeletal:     Right hand: Normal.     Left hand: Normal.  Skin:    General: Skin is warm and dry.  Neurological:     General: No focal deficit present.     Mental Status: She is alert and oriented to person, place, and time.  Psychiatric:        Mood and Affect: Mood normal.        Behavior: Behavior normal.        Judgment: Judgment normal.         Assessment and Plan   1. Gout of left hand, unspecified  cause, unspecified chronicity   2. Seasonal allergic rhinitis, unspecified trigger      Plan: See plan via problem list below.    Tests ordered Orders Placed This Encounter  Procedures   Comp Met (CMET)   Uric acid      Meds ordered this encounter  Medications   loratadine (CLARITIN) 10 MG tablet    Sig: Take 1 tablet (10 mg total) by mouth daily.    Dispense:  90 tablet    Refill:  1    Order Specific Question:   Supervising Provider    Answer:   Binnie Rail [id number]    Patient to follow-up in 6 to 8 weeks to monitor uric acid level response to anticipated increased dose of allopurinol, or sooner as needed.  Ailene Ards, NP

## 2021-08-07 NOTE — Assessment & Plan Note (Signed)
Chronic.  Refill of Claritin sent to patient's mail order pharmacy. ?

## 2021-08-08 ENCOUNTER — Other Ambulatory Visit: Payer: Self-pay | Admitting: Nurse Practitioner

## 2021-08-08 DIAGNOSIS — E876 Hypokalemia: Secondary | ICD-10-CM

## 2021-08-08 DIAGNOSIS — Z20822 Contact with and (suspected) exposure to covid-19: Secondary | ICD-10-CM | POA: Diagnosis not present

## 2021-08-11 NOTE — Progress Notes (Unsigned)
Patient is a 76 year old female s/p bilateral breast reduction performed 06/09/2021 by Dr. Claudia Desanctis who presents to clinic for postoperative follow-up.  She was last seen here in clinic on 07/23/2021.  At that time, small wounds noted at superior T-zone's bilaterally.  Mild firmness and skin darkening of left breast, but no cellulitic findings or obvious subcutaneous fluid collections.  Asked that she return in 3 weeks for follow-up.  Today,

## 2021-08-13 ENCOUNTER — Other Ambulatory Visit (INDEPENDENT_AMBULATORY_CARE_PROVIDER_SITE_OTHER): Payer: Medicare Other

## 2021-08-13 ENCOUNTER — Ambulatory Visit (INDEPENDENT_AMBULATORY_CARE_PROVIDER_SITE_OTHER): Payer: Medicare Other | Admitting: Physician Assistant

## 2021-08-13 ENCOUNTER — Telehealth: Payer: Self-pay

## 2021-08-13 ENCOUNTER — Other Ambulatory Visit: Payer: Self-pay

## 2021-08-13 DIAGNOSIS — E876 Hypokalemia: Secondary | ICD-10-CM

## 2021-08-13 DIAGNOSIS — Z9889 Other specified postprocedural states: Secondary | ICD-10-CM

## 2021-08-13 LAB — COMPREHENSIVE METABOLIC PANEL
ALT: 24 U/L (ref 0–35)
AST: 34 U/L (ref 0–37)
Albumin: 3.8 g/dL (ref 3.5–5.2)
Alkaline Phosphatase: 84 U/L (ref 39–117)
BUN: 19 mg/dL (ref 6–23)
CO2: 28 mEq/L (ref 19–32)
Calcium: 6.5 mg/dL — ABNORMAL LOW (ref 8.4–10.5)
Chloride: 99 mEq/L (ref 96–112)
Creatinine, Ser: 1.13 mg/dL (ref 0.40–1.20)
GFR: 46.98 mL/min — ABNORMAL LOW (ref >60.00)
Glucose, Bld: 89 mg/dL (ref 70–99)
Potassium: 2.6 mEq/L — CL (ref 3.5–5.1)
Sodium: 139 mEq/L (ref 135–145)
Total Bilirubin: 0.5 mg/dL (ref 0.2–1.2)
Total Protein: 7.8 g/dL (ref 6.0–8.3)

## 2021-08-13 MED ORDER — POTASSIUM CHLORIDE CRYS ER 15 MEQ PO TBCR: 15.0000 meq | EXTENDED_RELEASE_TABLET | Freq: Two times a day (BID) | ORAL | 0 refills | Status: DC

## 2021-08-13 NOTE — Addendum Note (Signed)
Addended by: Rossie Muskrat on: 08/13/2021 02:42 PM ? ? Modules accepted: Orders ? ?

## 2021-08-13 NOTE — Telephone Encounter (Signed)
Called pt and pt confirmed she has not been taking her potassium medication that was prescribed but has stopped her lasix. Pt states [facility] did not have it in stock and that is why she is not taking it.  ? ?I called [facility] and they confirmed the medication is on back order and that no [facility] within 20 miles has it in stock.  ?

## 2021-08-13 NOTE — Telephone Encounter (Signed)
CRITICAL VALUE STICKER ? ?CRITICAL VALUE: Potassium 2.6 ? ?RECEIVER (on-site recipient of call): Jarrett Soho ? ?DATE & TIME NOTIFIED: 08/13/2021 at 2:12 pm ? ?MESSENGER (representative from lab): Saa ? ?MD NOTIFIED: Dr. Mitchel Honour ? ?TIME OF NOTIFICATION: 2:15 pm ? ?RESPONSE:  Call pt and make sure she is taking potassium as prescribed and has stopped lasix. ?

## 2021-08-13 NOTE — Telephone Encounter (Signed)
[facility] ?[place], [place] [address] and they have the medication in stock. Rx sent. ?

## 2021-08-14 ENCOUNTER — Ambulatory Visit (INDEPENDENT_AMBULATORY_CARE_PROVIDER_SITE_OTHER): Payer: Medicare Other | Admitting: Nurse Practitioner

## 2021-08-14 VITALS — BP 120/76 | HR 83 | Temp 97.6°F | Ht 65.0 in | Wt 211.1 lb

## 2021-08-14 DIAGNOSIS — M1A9XX0 Chronic gout, unspecified, without tophus (tophi): Secondary | ICD-10-CM | POA: Diagnosis not present

## 2021-08-14 DIAGNOSIS — E876 Hypokalemia: Secondary | ICD-10-CM

## 2021-08-14 NOTE — Assessment & Plan Note (Signed)
Uric acid elevated, will consider increasing dose of allopurinol once hypokalemia has resolved.  Patient will follow-up for close monitoring next week. ?

## 2021-08-14 NOTE — Progress Notes (Signed)
? ? ? ?Subjective:  ?Patient ID: [name], female    DOB: [birth year]  Age: 76 y.o. MRN: [id number] ? ?CC:  ?Chief Complaint  ?Patient presents with  ? Follow-up  ?  No concerns  ?  ? ? ?HPI  ?This patient arrives today for the above. ? ?At last office visit we discussed swelling and pain in her left hand.  It was thought that was caused by gouty flare.  Blood work did show elevated uric acid, patient does continue on allopurinol 100 mg a mouth daily.  Lab was also remarkable for hypokalemia of 2.8.  At that time was recommend she stop her Lasix and start potassium supplement.  She was unable to get potassium due to it being out of stock at her pharmacy.  It was eventually transferred to the pharmacy that did have potassium in stock so she tells me she started taking potassium as of yesterday.  She tells me she is only on the Lasix for her swelling, and since stopping the Lasix she has not noted any worsening swelling.  Tells me she does have a history of hypokalemia resulting in IV potassium supplementation.  Tells me at that time she was having muscle weakness.  Today, she tells me she is not experiencing any weakness or fatigue.  She is also not experiencing chest pain or cardiac palpitations. ? ?Past Medical History:  ?Diagnosis Date  ? Allergy   ? ANEMIA, IRON DEFICIENCY 05/08/2009  ? Angina   ? ASYMPTOMATIC POSTMENOPAUSAL STATUS 10/11/2008  ? Blood transfusion   ? Blood transfusion without reported diagnosis   ? Breast cancer ([place]) 09/29/2011  ? invasive grade III ductal ca,assoc high grade dcis,ER/PR=neg; left breast  ? C. difficile colitis   ? Cataract   ? Diverticulosis of colon (without mention of hemorrhage)   ? Esophageal reflux 06/12/2008  ? Gastroparesis   ? GOITER, MULTINODULAR 04/02/2009  ? Gout, unspecified   ? H/O hiatal hernia   ? History of kidney stones   ? History of lower GI bleeding   ? History of radiation therapy 02/08/12-03/25/12  ? left breast,total 61gy  ? Hypokalemia 05/11/2013   ? Hypomagnesemia   ? HYPOTHYROIDISM, POST-RADIATION 08/13/2009  ? Internal hemorrhoids without mention of complication   ? Kidney stones   ? "several"  ? Leukopenia   ? Migraines   ? Obesity   ? On supplemental oxygen therapy   ? at night while sleeping - patient did not want to undergo sleep study  ? Osteoarthrosis, unspecified whether generalized or localized, unspecified site   ? Other and unspecified hyperlipidemia   ? Personal history of chemotherapy 2013  ? Personal history of radiation therapy 2013  ? left  ? PONV (postoperative nausea and vomiting)   ? PUD (peptic ulcer disease)   ? Short bowel syndrome   ? Shortness of breath on exertion   ? "sometimes"  ? Stricture and stenosis of esophagus   ? Thyrotoxicosis without mention of goiter or other cause, without mention of thyrotoxic crisis or storm   ? Type II or unspecified type diabetes mellitus without mention of complication, not stated as uncontrolled   ? no med in years diet controled  ? Unspecified essential hypertension   ? UTI (urinary tract infection)   ? Varicose veins   ? VITAMIN B12 DEFICIENCY 08/30/2009  ? ? ? ? ?Family History  ?Problem Relation Age of Onset  ? Esophageal cancer Son   ?     deceased  ?  Breast cancer Son   ?     esophageal  ? Diabetes Mother   ? Heart disease Mother   ? Kidney disease Sister   ? Diabetes Father   ? Hypertension Father   ? Kidney disease Brother   ?     x 3  ? Colon cancer Paternal Uncle   ? Breast cancer Maternal Aunt   ? Breast cancer Paternal Aunt   ? Breast cancer Cousin   ?     Pt states she has 15+ cousins w/ Breast CA  ? Rectal cancer Neg Hx   ? Stomach cancer Neg Hx   ? ? ?Social History  ? ?Social History Narrative  ? Not on file  ? ?Social History  ? ?Tobacco Use  ? Smoking status: Former  ?  Packs/day: 1.00  ?  Years: 10.00  ?  Pack years: 10.00  ?  Types: Cigarettes  ?  Quit date: 09/22/1985  ?  Years since quitting: 35.9  ? Smokeless tobacco: Never  ?Substance Use Topics  ? Alcohol use: No   ? ? ? ?Current Meds  ?Medication Sig  ? albuterol (VENTOLIN HFA) 108 (90 Base) MCG/ACT inhaler Inhale 2 puffs into the lungs every 6 (six) hours as needed for wheezing or shortness of breath.  ? allopurinol (ZYLOPRIM) 100 MG tablet Take 1 tablet (100 mg total) by mouth daily.  ? amitriptyline (ELAVIL) 50 MG tablet TAKE ONE TABLET BY MOUTH EVERY DAY AT BEDTIME (USE CAUTION - MAY CAUSE DROWSINESS)  ? calcium carbonate (TUMS - DOSED IN MG ELEMENTAL CALCIUM) 500 MG chewable tablet Chew 1 tablet by mouth daily as needed for indigestion or heartburn.  ? Calcium Carbonate-Vitamin D (CALCIUM-VITAMIN D) 600-200 MG-UNIT CAPS Take 1 capsule by mouth daily.  ? cholecalciferol (VITAMIN D) 25 MCG (1000 UNIT) tablet Take 1,000 Units by mouth daily.  ? Cyanocobalamin (VITAMIN B 12 PO) Take 1,000 mcg by mouth daily.  ? dicyclomine (BENTYL) 20 MG tablet Take 1 tablet (20 mg total) by mouth 3 (three) times daily as needed for spasms.  ? Dulaglutide (TRULICITY) 3 EQ/6.8TM SOPN Inject 3 mg as directed once a week.  ? famotidine (PEPCID) 40 MG tablet Take 1 tablet (40 mg total) by mouth at bedtime.  ? ferrous sulfate 325 (65 FE) MG tablet Take 1 tablet (325 mg total) by mouth daily with breakfast.  ? fluticasone (FLONASE) 50 MCG/ACT nasal spray Place 2 sprays into both nostrils daily.  ? HYDROcodone-acetaminophen (NORCO/VICODIN) 5-325 MG tablet Take 1 tablet by mouth every 6 (six) hours as needed.  ? levothyroxine (SYNTHROID) 175 MCG tablet TAKE ONE TABLET BY MOUTH EVERY DAY BEFORE BREAKFAST.  ? loratadine (CLARITIN) 10 MG tablet Take 1 tablet (10 mg total) by mouth daily.  ? lovastatin (MEVACOR) 20 MG tablet TAKE ONE TABLET BY MOUTH EVERY DAY AT 6:00PM  ? meloxicam (MOBIC) 15 MG tablet Take 1 tablet (15 mg total) by mouth daily.  ? nitroGLYCERIN (NITROSTAT) 0.4 MG SL tablet Place 1 tablet (0.4 mg total) under the tongue every 5 (five) minutes as needed.  ? ondansetron (ZOFRAN) 4 MG tablet Take 1 tablet (4 mg total) by mouth every 8  (eight) hours as needed for nausea or vomiting.  ? pantoprazole (PROTONIX) 40 MG tablet TAKE 1 TABLET BY MOUTH 30 MINUTES PRIOR TO BREAKFAST AND SUPPER  ? potassium chloride SA (KLOR-CON M15) 15 MEQ tablet Take 1 tablet (15 mEq total) by mouth 2 (two) times daily.  ? pregabalin (LYRICA) 75 MG  capsule Take 1 capsule (75 mg total) by mouth 2 (two) times daily. For nerve pain  ? ? ?ROS:  ?See HPI ? ? ?Objective:  ? ?Today's Vitals: BP 120/76   Pulse 83   Temp 97.6 ?F (36.4 ?C) (Oral)   Ht '5\' 5"'$  (1.651 m)   Wt 211 lb 2 oz (95.8 kg)   SpO2 99%   BMI 35.13 kg/m?  ?Vitals with BMI 08/14/2021 08/07/2021 07/21/2021  ?Height '5\' 5"'$  '5\' 5"'$  '5\' 5"'$   ?Weight 211 lbs 2 oz 207 lbs 216 lbs 6 oz  ?BMI 35.13 34.45 36.01  ?Systolic 559 741 638  ?Diastolic 76 78 82  ?Pulse 83 85 80  ?  ? ?Physical Exam ?Vitals reviewed.  ?Constitutional:   ?   General: She is not in acute distress. ?   Appearance: Normal appearance.  ?HENT:  ?   Head: Normocephalic and atraumatic.  ?Neck:  ?   Vascular: No carotid bruit.  ?Cardiovascular:  ?   Rate and Rhythm: Normal rate and regular rhythm.  ?   Pulses: Normal pulses.  ?   Heart sounds: Normal heart sounds.  ?Pulmonary:  ?   Effort: Pulmonary effort is normal.  ?   Breath sounds: Normal breath sounds.  ?Skin: ?   General: Skin is warm and dry.  ?Neurological:  ?   General: No focal deficit present.  ?   Mental Status: She is alert and oriented to person, place, and time.  ?Psychiatric:     ?   Mood and Affect: Mood normal.     ?   Behavior: Behavior normal.     ?   Judgment: Judgment normal.  ? ? ? ? ?EKG: Sinus rhythm with first-degree AV block, prolonged QT ? ? ?Assessment and Plan  ? ?1. Hypokalemia   ? ? ? ?Plan: ?See plan via problem list below. ? ? ? ?Tests ordered ?Orders Placed This Encounter  ?Procedures  ? EKG 12-Lead  ? ? ? ? ?No orders of the defined types were placed in this encounter. ? ? ?Patient to follow-up in 1 week for close monitoring ? ?Ailene Ards, NP ? ?

## 2021-08-14 NOTE — Assessment & Plan Note (Addendum)
Patient is found on potassium supplement.  She is asymptomatic.  She will continue taking potassium supplement and will follow-up for close monitoring next week. EKG shows first degree AV block (PR interval of 22m) and Q-T Interval of 448/4922m I recommend she avoid taking Zofran as well as consider reducing amitriptyline to 25 mg by mouth daily (she reports taking this for insomnia).  She will reduce her amitriptyline to 1/2 tablet by mouth at night, may consider changing prescription at next office visit if she tolerates the lower dose.  She was told to call 911 if she experiences any chest pain, cardiac palpitations, fatigue, and/or weakness. ?

## 2021-08-19 DIAGNOSIS — Z20822 Contact with and (suspected) exposure to covid-19: Secondary | ICD-10-CM | POA: Diagnosis not present

## 2021-08-21 ENCOUNTER — Ambulatory Visit (INDEPENDENT_AMBULATORY_CARE_PROVIDER_SITE_OTHER): Payer: Medicare Other | Admitting: Obstetrics & Gynecology

## 2021-08-21 ENCOUNTER — Encounter: Payer: Self-pay | Admitting: Obstetrics & Gynecology

## 2021-08-21 ENCOUNTER — Other Ambulatory Visit: Payer: Self-pay

## 2021-08-21 ENCOUNTER — Ambulatory Visit (INDEPENDENT_AMBULATORY_CARE_PROVIDER_SITE_OTHER): Payer: Medicare Other | Admitting: Nurse Practitioner

## 2021-08-21 VITALS — BP 114/76

## 2021-08-21 VITALS — BP 122/76 | HR 86 | Temp 98.3°F | Ht 65.0 in | Wt 208.2 lb

## 2021-08-21 DIAGNOSIS — E876 Hypokalemia: Secondary | ICD-10-CM

## 2021-08-21 DIAGNOSIS — N898 Other specified noninflammatory disorders of vagina: Secondary | ICD-10-CM

## 2021-08-21 DIAGNOSIS — R310 Gross hematuria: Secondary | ICD-10-CM | POA: Diagnosis not present

## 2021-08-21 DIAGNOSIS — N939 Abnormal uterine and vaginal bleeding, unspecified: Secondary | ICD-10-CM | POA: Insufficient documentation

## 2021-08-21 DIAGNOSIS — M1A9XX0 Chronic gout, unspecified, without tophus (tophi): Secondary | ICD-10-CM

## 2021-08-21 DIAGNOSIS — Z9079 Acquired absence of other genital organ(s): Secondary | ICD-10-CM

## 2021-08-21 DIAGNOSIS — Z90722 Acquired absence of ovaries, bilateral: Secondary | ICD-10-CM

## 2021-08-21 DIAGNOSIS — N760 Acute vaginitis: Secondary | ICD-10-CM | POA: Diagnosis not present

## 2021-08-21 DIAGNOSIS — Z90710 Acquired absence of both cervix and uterus: Secondary | ICD-10-CM

## 2021-08-21 DIAGNOSIS — D500 Iron deficiency anemia secondary to blood loss (chronic): Secondary | ICD-10-CM | POA: Diagnosis not present

## 2021-08-21 DIAGNOSIS — B9689 Other specified bacterial agents as the cause of diseases classified elsewhere: Secondary | ICD-10-CM

## 2021-08-21 LAB — COMPREHENSIVE METABOLIC PANEL
ALT: 29 U/L (ref 0–35)
AST: 49 U/L — ABNORMAL HIGH (ref 0–37)
Albumin: 3.7 g/dL (ref 3.5–5.2)
Alkaline Phosphatase: 74 U/L (ref 39–117)
BUN: 13 mg/dL (ref 6–23)
CO2: 34 mEq/L — ABNORMAL HIGH (ref 19–32)
Calcium: 6.9 mg/dL — ABNORMAL LOW (ref 8.4–10.5)
Chloride: 98 mEq/L (ref 96–112)
Creatinine, Ser: 1.12 mg/dL (ref 0.40–1.20)
GFR: 47.47 mL/min — ABNORMAL LOW (ref >60.00)
Glucose, Bld: 86 mg/dL (ref 70–99)
Potassium: 2.9 mEq/L — ABNORMAL LOW (ref 3.5–5.1)
Sodium: 142 mEq/L (ref 135–145)
Total Bilirubin: 0.6 mg/dL (ref 0.2–1.2)
Total Protein: 7.6 g/dL (ref 6.0–8.3)

## 2021-08-21 LAB — IRON: Iron: 53 ug/dL (ref 42–145)

## 2021-08-21 LAB — CBC
HCT: 28.6 % — ABNORMAL LOW (ref 36.0–46.0)
Hemoglobin: 9.3 g/dL — ABNORMAL LOW (ref 12.0–15.0)
MCHC: 32.7 g/dL (ref 30.0–36.0)
MCV: 86.9 fl (ref 78.0–100.0)
Platelets: 320 10*3/uL (ref 150.0–400.0)
RBC: 3.29 Mil/uL — ABNORMAL LOW (ref 3.87–5.11)
RDW: 15 % (ref 11.5–15.5)
WBC: 5.6 10*3/uL (ref 4.0–10.5)

## 2021-08-21 LAB — FERRITIN: Ferritin: 323.9 ng/mL — ABNORMAL HIGH (ref 10.0–291.0)

## 2021-08-21 LAB — WET PREP FOR TRICH, YEAST, CLUE

## 2021-08-21 MED ORDER — SULFAMETHOXAZOLE-TRIMETHOPRIM 800-160 MG PO TABS: 1.0000 | ORAL_TABLET | Freq: Two times a day (BID) | ORAL | 0 refills | Status: DC

## 2021-08-21 MED ORDER — CLINDAMYCIN HCL 300 MG PO CAPS: 300.0000 mg | ORAL_CAPSULE | Freq: Two times a day (BID) | ORAL | 0 refills | Status: DC

## 2021-08-21 NOTE — Assessment & Plan Note (Addendum)
Patient continues on her potassium supplement.  We will recheck metabolic panel to see what potassium level is today.  Further recommendations may be made based upon these results.  ?

## 2021-08-21 NOTE — Assessment & Plan Note (Signed)
Etiology unclear.  I did consider ordering transvaginal ultrasound today, but patient would prefer to have this work-up completed by OB/GYN.  She is encouraged to call her OB/GYN to schedule an appointment, but I have also order referral in case its been long enough that she needs to reestablish as a new patient.  She reports her understanding and tells me she will call them today. ?

## 2021-08-21 NOTE — Assessment & Plan Note (Addendum)
Anticipate increasing dose of allopurinol to 200 mg by mouth daily.  Awaiting metabolic panel results before making this recommendation.  Patient told that she will be called and notified regarding medication dosage changes based on lab results from today.  She is scheduled to follow-up with her PCP in 1 month, would consider rechecking uric acid level at that time per PCP discretion. ?

## 2021-08-21 NOTE — Progress Notes (Signed)
Subjective:  Patient ID: [name], female    DOB: Oct 05, 1945  Age: 76 y.o. MRN: [id number]  CC:  Chief Complaint  Patient presents with   Follow-up   Vaginal Bleeding    Bright red, referral to GYN 2 or 3 times a week       HPI  This patient arrives today for the above.  Hypokalemia: She has now been taking her potassium supplement as prescribed. She denies new/worsening fatigue, weakness, or cardiac palpitations.  She did reduce her dose of amitriptyline to 25mg  by mouth at night.  She tells me she would prefer to go back to the 50 mg dose.  Abnormal Vaginal bleeding/IDA: Has been going on for "months", is intermittent. Physical activity/exertion (such as cleaning) will trigger the vaginal bleeding. It will usually stop within a day or so. She reports history of total hysterectomy back in the 1990s. She does have OBGYN that she is established with.  She does have history of iron deficiency anemia.  She continues on ferrous sulfate.  Last hemoglobin was 10.0 collected in December of 2022.  She has history of chronic kidney disease reports seeing nephrologist on a regular basis.  She tells me last office visit was with the last 6 months, I do not see evidence of this office visit in epic, so I suspect he may be outside of the [facility] health system.  She reports his name is Dr. Allena Katz.  Last EGFR was 46.98 and this was collected last week.  Gout: She has had recent gout flares in her hands.  Last uric acid level was 8.3.  She continues on allopurinol 100 mg by mouth daily.  Past Medical History:  Diagnosis Date   Allergy    ANEMIA, IRON DEFICIENCY 05/08/2009   Angina    ASYMPTOMATIC POSTMENOPAUSAL STATUS 10/11/2008   Blood transfusion    Blood transfusion without reported diagnosis    Breast cancer (HCC) 09/29/2011   invasive grade III ductal ca,assoc high grade dcis,ER/PR=neg; left breast   C. difficile colitis    Cataract    Diverticulosis of colon (without mention of  hemorrhage)    Esophageal reflux 06/12/2008   Gastroparesis    GOITER, MULTINODULAR 04/02/2009   Gout, unspecified    H/O hiatal hernia    History of kidney stones    History of lower GI bleeding    History of radiation therapy 02/08/12-03/25/12   left breast,total 61gy   Hypokalemia 05/11/2013   Hypomagnesemia    HYPOTHYROIDISM, POST-RADIATION 08/13/2009   Internal hemorrhoids without mention of complication    Kidney stones    "several"   Leukopenia    Migraines    Obesity    On supplemental oxygen therapy    at night while sleeping - patient did not want to undergo sleep study   Osteoarthrosis, unspecified whether generalized or localized, unspecified site    Other and unspecified hyperlipidemia    Personal history of chemotherapy 2013   Personal history of radiation therapy 2013   left   PONV (postoperative nausea and vomiting)    PUD (peptic ulcer disease)    Short bowel syndrome    Shortness of breath on exertion    "sometimes"   Stricture and stenosis of esophagus    Thyrotoxicosis without mention of goiter or other cause, without mention of thyrotoxic crisis or storm    Type II or unspecified type diabetes mellitus without mention of complication, not stated as uncontrolled    no med  in years diet controled   Unspecified essential hypertension    UTI (urinary tract infection)    Varicose veins    VITAMIN B12 DEFICIENCY 08/30/2009      Family History  Problem Relation Age of Onset   Esophageal cancer Son        deceased   Breast cancer Son        esophageal   Diabetes Mother    Heart disease Mother    Kidney disease Sister    Diabetes Father    Hypertension Father    Kidney disease Brother        x 3   Colon cancer Paternal Uncle    Breast cancer Maternal Aunt    Breast cancer Paternal Aunt    Breast cancer Cousin        Pt states she has 15+ cousins w/ Breast CA   Rectal cancer Neg Hx    Stomach cancer Neg Hx     Social History   Social History  Narrative   Not on file   Social History   Tobacco Use   Smoking status: Former    Packs/day: 1.00    Years: 10.00    Pack years: 10.00    Types: Cigarettes    Quit date: 09/22/1985    Years since quitting: 35.9   Smokeless tobacco: Never  Substance Use Topics   Alcohol use: No     Current Meds  Medication Sig   albuterol (VENTOLIN HFA) 108 (90 Base) MCG/ACT inhaler Inhale 2 puffs into the lungs every 6 (six) hours as needed for wheezing or shortness of breath.   allopurinol (ZYLOPRIM) 100 MG tablet Take 1 tablet (100 mg total) by mouth daily.   amitriptyline (ELAVIL) 50 MG tablet TAKE ONE TABLET BY MOUTH EVERY DAY AT BEDTIME (USE CAUTION - MAY CAUSE DROWSINESS)   calcium carbonate (TUMS - DOSED IN MG ELEMENTAL CALCIUM) 500 MG chewable tablet Chew 1 tablet by mouth daily as needed for indigestion or heartburn.   Calcium Carbonate-Vitamin D (CALCIUM-VITAMIN D) 600-200 MG-UNIT CAPS Take 1 capsule by mouth daily.   cholecalciferol (VITAMIN D) 25 MCG (1000 UNIT) tablet Take 1,000 Units by mouth daily.   Cyanocobalamin (VITAMIN B 12 PO) Take 1,000 mcg by mouth daily.   dicyclomine (BENTYL) 20 MG tablet Take 1 tablet (20 mg total) by mouth 3 (three) times daily as needed for spasms.   Dulaglutide (TRULICITY) 3 MG/0.5ML SOPN Inject 3 mg as directed once a week.   famotidine (PEPCID) 40 MG tablet Take 1 tablet (40 mg total) by mouth at bedtime.   ferrous sulfate 325 (65 FE) MG tablet Take 1 tablet (325 mg total) by mouth daily with breakfast.   fluticasone (FLONASE) 50 MCG/ACT nasal spray Place 2 sprays into both nostrils daily.   HYDROcodone-acetaminophen (NORCO/VICODIN) 5-325 MG tablet Take 1 tablet by mouth every 6 (six) hours as needed.   levothyroxine (SYNTHROID) 175 MCG tablet TAKE ONE TABLET BY MOUTH EVERY DAY BEFORE BREAKFAST.   loratadine (CLARITIN) 10 MG tablet Take 1 tablet (10 mg total) by mouth daily.   lovastatin (MEVACOR) 20 MG tablet TAKE ONE TABLET BY MOUTH EVERY DAY AT  6:00PM   meloxicam (MOBIC) 15 MG tablet Take 1 tablet (15 mg total) by mouth daily.   nitroGLYCERIN (NITROSTAT) 0.4 MG SL tablet Place 1 tablet (0.4 mg total) under the tongue every 5 (five) minutes as needed.   ondansetron (ZOFRAN) 4 MG tablet Take 1 tablet (4 mg total) by mouth every  8 (eight) hours as needed for nausea or vomiting.   pantoprazole (PROTONIX) 40 MG tablet TAKE 1 TABLET BY MOUTH 30 MINUTES PRIOR TO BREAKFAST AND SUPPER   potassium chloride SA (KLOR-CON M15) 15 MEQ tablet Take 1 tablet (15 mEq total) by mouth 2 (two) times daily.   Potassium Citrate 15 MEQ (1620 MG) TBCR Take 1 tablet by mouth 2 (two) times daily.   pregabalin (LYRICA) 75 MG capsule Take 1 capsule (75 mg total) by mouth 2 (two) times daily. For nerve pain    ROS:  Review of Systems  Constitutional:  Positive for malaise/fatigue (chronic).  Cardiovascular:  Negative for chest pain and palpitations.  Gastrointestinal:  Negative for blood in stool.  Genitourinary:        (+) vaginal bleeding  Neurological:  Positive for dizziness (intermittent resolves spontaneously within in a few minutes). Negative for loss of consciousness.    Objective:   Today's Vitals: BP 122/76   Pulse 86   Temp 98.3 F (36.8 C) (Oral)   Ht 5\' 5"  (1.651 m)   Wt 208 lb 4 oz (94.5 kg)   SpO2 99%   BMI 34.65 kg/m     08/21/2021    9:40 AM 08/14/2021    1:01 PM 08/07/2021   12:59 PM  Vitals with BMI  Height 5\' 5"  5\' 5"  5\' 5"   Weight 208 lbs 4 oz 211 lbs 2 oz 207 lbs  BMI 34.65 35.13 34.45  Systolic 122 120 308  Diastolic 76 76 78  Pulse 86 83 85     Physical Exam Vitals reviewed.  Constitutional:      General: She is not in acute distress.    Appearance: Normal appearance.  HENT:     Head: Normocephalic and atraumatic.  Neck:     Vascular: No carotid bruit.  Cardiovascular:     Rate and Rhythm: Normal rate and regular rhythm.     Pulses: Normal pulses.     Heart sounds: Normal heart sounds.  Pulmonary:      Effort: Pulmonary effort is normal.     Breath sounds: Normal breath sounds.  Skin:    General: Skin is warm and dry.  Neurological:     General: No focal deficit present.     Mental Status: She is alert and oriented to person, place, and time.  Psychiatric:        Mood and Affect: Mood normal.        Behavior: Behavior normal.        Judgment: Judgment normal.         Assessment and Plan   1. Abnormal vaginal bleeding   2. Chronic gout without tophus, unspecified cause, unspecified site   3. Hypokalemia   4. Iron deficiency anemia due to chronic blood loss      Plan: See plan via problem list below.    Tests ordered Orders Placed This Encounter  Procedures   Comp Met (CMET)   CBC   Iron   Ferritin   Ambulatory referral to Obstetrics / Gynecology      No orders of the defined types were placed in this encounter.   Patient to follow-up in 1 month as scheduled with PCP, or sooner as needed.  Elenore Paddy, NP

## 2021-08-21 NOTE — Assessment & Plan Note (Signed)
Because she is bleeding we will check CBC to monitor her anemia as well as iron and ferritin levels.  Further recommendations be made based upon these results. ?

## 2021-08-21 NOTE — Progress Notes (Signed)
? ? ?[name] 11-28-1945 [id number] ? ? ?     76 y.o.  [id number]  ? ?RP: Vaginal or urinary Bleeding intermittently ? ?HPI: Intermittent blood seen when wiping after passing urine.  Vaginal discharge present.  Patient doesn't know if it is vaginal blood or blood in urine. No blood seen in or around the stools. Sometimes feels cramps in the lower abdomen.  No recent Colonoscopy. H/O TAH/BSO in 1970's.  H/O abnormal Paps before Hysterectomy per patient.  Pap test 03/2019 ASCUS/HPV HR Neg, repeat Pap 03/2020 Negative.  Abstinent. H/O Left Breast Ca in 2011/08/30.  Breasts normal currently.  Mammo 10/2020 Neg.  ? ? ?OB History  ?Gravida Para Term Preterm AB Living  ?'4 3     1 2  '$ ?SAB IAB Ectopic Multiple Live Births  ?1       3  ?  ?# Outcome Date GA Lbr Len/2nd Weight Sex Delivery Anes PTL Lv  ?4 SAB           ?3 Para           ?2 Para           ?1 Para           ?  ?Obstetric Comments  ?Oldest son passed away in 30-Aug-2006 from esophageal cancer  ? ? ?Past medical history,surgical history, problem list, medications, allergies, family history and social history were all reviewed and documented in the EPIC chart. ? ? ?Directed ROS with pertinent positives and negatives documented in the history of present illness/assessment and plan. ? ?Exam: ? ?Vitals:  ? 08/21/21 1446  ?BP: 114/76  ? ?General appearance:  Normal ? ?CVAT Neg bilaterally ? ?Gynecologic exam: Vulva normal.  Speculum:  Vagina normal except mildly inflamed with abundant discharge. No lesion.  Wet prep done.  Bimanual exam:  No pelvic mass, NT. ? ?Rectal exam Negative. ? ? ?Wet prep:  Clue cells present with a strong odor ?U/A: Dark yellow cloudy, ketones trace, protein 2+, nitrites negative, white blood cells 40-60, red blood cells 3-10, bacteria many.  Urine culture pending. ? ? ?Assessment/Plan:  76 y.o. [id number]  ? ?1. Vaginal bleeding ?Intermittent blood seen when wiping after passing urine.  Vaginal discharge present.  Patient doesn't know if it is vaginal  blood or blood in urine. No blood seen in or around the stools. Sometimes feels cramps in the lower abdomen.  No recent Colonoscopy. H/O TAH/BSO in 1970's.  H/O abnormal Paps before Hysterectomy per patient.  Pap test 03/2019 ASCUS/HPV HR Neg, repeat Pap 03/2020 Negative.  Abstinent. H/O Left Breast Ca in 08/30/11.  Breasts normal currently.  Mammo 10/2020 Neg.  ?- Urinalysis,Complete w/RFL Culture ?- WET PREP FOR TRICH, YEAST, CLUE ? ?2. Gross hematuria ?U/A abnormal, compatible with acute cystitis.  Decision to treat with Bactrim DS 1 tablet per mouth twice a day for 7 days.  Usage reviewed and prescription sent to pharmacy.  Patient encouraged to hydrate better with water. ?- Urinalysis,Complete w/RFL Culture ? ?3. S/P TAH-BSO ? ?4. Vaginal discharge ?Wet prep positive for BV. ?- WET PREP FOR [place], YEAST, CLUE ? ?5. Bacterial vaginosis ?Severe Bacterial Vaginosis.  Allergy to Metronidazole.  Decision to treat with Clindamycin 300 mg PO BID x 5 days.  Usage reviewed and prescription sent to pharmacy. ? ?Other orders ?- clindamycin (CLEOCIN) 300 MG capsule; Take 1 capsule (300 mg total) by mouth 2 (two) times daily for 5 days.  ?- sulfamethoxazole-trimethoprim (BACTRIM DS) 800-160 MG tablet; Take  1 tablet by mouth 2 (two) times daily for 7 days.  ? ?Counseling on the importance of hydrating better with increased water intake, risks of bladder infections and prevention reviewed.  Bacterial vaginosis treatment and prevention with Boric Acid vaginally weekly discussed.  Counseling on those issues for 30 minutes. ? ?Princess Bruins MD, 2:53 PM 08/21/2021 ? ? ? ?  ?

## 2021-08-22 ENCOUNTER — Other Ambulatory Visit: Payer: Self-pay | Admitting: Family Medicine

## 2021-08-22 ENCOUNTER — Other Ambulatory Visit: Payer: Self-pay | Admitting: Nurse Practitioner

## 2021-08-22 ENCOUNTER — Other Ambulatory Visit: Payer: Self-pay | Admitting: *Deleted

## 2021-08-22 ENCOUNTER — Telehealth: Payer: Self-pay | Admitting: *Deleted

## 2021-08-22 ENCOUNTER — Other Ambulatory Visit (INDEPENDENT_AMBULATORY_CARE_PROVIDER_SITE_OTHER): Payer: Medicare Other

## 2021-08-22 DIAGNOSIS — E876 Hypokalemia: Secondary | ICD-10-CM | POA: Diagnosis not present

## 2021-08-22 LAB — COMPREHENSIVE METABOLIC PANEL
ALT: 28 U/L (ref 0–35)
AST: 55 U/L — ABNORMAL HIGH (ref 0–37)
Albumin: 3.7 g/dL (ref 3.5–5.2)
Alkaline Phosphatase: 71 U/L (ref 39–117)
BUN: 15 mg/dL (ref 6–23)
CO2: 35 mEq/L — ABNORMAL HIGH (ref 19–32)
Calcium: 6.5 mg/dL — ABNORMAL LOW (ref 8.4–10.5)
Chloride: 96 mEq/L (ref 96–112)
Creatinine, Ser: 1.03 mg/dL (ref 0.40–1.20)
GFR: 52.49 mL/min — ABNORMAL LOW (ref >60.00)
Glucose, Bld: 74 mg/dL (ref 70–99)
Potassium: 2.8 mEq/L — CL (ref 3.5–5.1)
Sodium: 140 mEq/L (ref 135–145)
Total Bilirubin: 0.7 mg/dL (ref 0.2–1.2)
Total Protein: 7.6 g/dL (ref 6.0–8.3)

## 2021-08-22 LAB — MAGNESIUM: Magnesium: 0.4 mg/dL — CL (ref 1.5–2.5)

## 2021-08-22 MED ORDER — POTASSIUM CHLORIDE CRYS ER 20 MEQ PO TBCR: 40.0000 meq | EXTENDED_RELEASE_TABLET | Freq: Two times a day (BID) | ORAL | 2 refills | Status: DC

## 2021-08-22 MED ORDER — CLINDAMYCIN HCL 300 MG PO CAPS: 300.0000 mg | ORAL_CAPSULE | Freq: Two times a day (BID) | ORAL | 0 refills | Status: AC

## 2021-08-22 MED ORDER — SULFAMETHOXAZOLE-TRIMETHOPRIM 800-160 MG PO TABS: 1.0000 | ORAL_TABLET | Freq: Two times a day (BID) | ORAL | 0 refills | Status: AC

## 2021-08-22 NOTE — Progress Notes (Signed)
After service hours: Called with critical lab for potassium 2.8,mag 0.4. missed patient x 2, but she was able to call back. She has hx of magnesium deficiency, but hasn't been taking magnesium at home. Initially was going to try oral replacement, but with her chronic short gut and speaking with pharmacist about difficulty with absorption and timing to normalize levels, suggested she go to hospital for replacement. Her son is currently at work, so she wanted to wait for him to bring her. She does have magnesium at home and will take restart and agrees to go to [facility] when he can drive her for IV replacement of both potassium and magnesium. She is currently feeling well. She did pick up her higher dose of potassium just this evening and will begin taking this as well.  ?

## 2021-08-22 NOTE — Progress Notes (Signed)
Patient states she wanted her rx for potassium sent to [facility]. Sent rx to correct pharmacy  ?

## 2021-08-22 NOTE — Telephone Encounter (Signed)
Spoke with patient. Patient was seen for OV on 08/21/21. Rx not at pharmacy when she went to get them. Advised patient Rx went to mail order [facility]. Advised I will call to cancel orders for Bactrim DS and Clindamycin at [facility] and sent new Rx to [facility] [address]. Advised patient I will return call if any concerns. Patient verbalizes understanding and is agreeable.   ? ?Call placed to [facility] [phone]. Transferred to automated system, requested fax to [phone]. Fax sent requesting to cancel Rx for Clindamycin and Bactrim dated 08/21/21.  ? ?Routing to Dr. Ileene Musa.  ? ?Encounter closed.  ?

## 2021-08-23 LAB — CULTURE INDICATED

## 2021-08-23 LAB — URINALYSIS, COMPLETE W/RFL CULTURE
Casts: NONE SEEN /LPF
Crystals: NONE SEEN /HPF
Glucose, UA: NEGATIVE
Hyaline Cast: NONE SEEN /LPF
Nitrites, Initial: NEGATIVE
Specific Gravity, Urine: 1.015 (ref 1.001–1.035)
Yeast: NONE SEEN /HPF
pH: 5.5 (ref 5.0–8.0)

## 2021-08-23 LAB — URINE CULTURE
MICRO NUMBER:: 13170561
SPECIMEN QUALITY:: ADEQUATE

## 2021-08-25 ENCOUNTER — Telehealth: Payer: Self-pay | Admitting: Internal Medicine

## 2021-08-25 LAB — PTH, INTACT (ICMA) AND IONIZED CALCIUM
Calcium, Ion: 3.8 mg/dL — ABNORMAL LOW (ref 4.7–5.5)
Calcium: 6.5 mg/dL — ABNORMAL LOW (ref 8.6–10.4)
PTH: 42 pg/mL (ref 16–77)

## 2021-08-25 NOTE — Telephone Encounter (Signed)
Connected to [facility] 3.24.2023. ? ?Caller states he has a critical result. The patient ?sees NP Jeralyn Ruths who is under Dr. Marcello Moores ?Ronnald Ramp. ? ?Reason: Mag 0.4 K+ 2.8 collected on ?3/24 at 1506 ?

## 2021-08-25 NOTE — Telephone Encounter (Signed)
These are not resulted in the chart can that be done? These are from Friday and should be back by now so that we can verify and address results. Then send back to me thanks ?

## 2021-08-26 LAB — TSH: TSH: 3.3 u[IU]/mL (ref 0.35–5.50)

## 2021-08-26 LAB — VITAMIN D 25 HYDROXY (VIT D DEFICIENCY, FRACTURES): VITD: 25.97 ng/mL — ABNORMAL LOW (ref 30.00–100.00)

## 2021-08-28 ENCOUNTER — Ambulatory Visit (INDEPENDENT_AMBULATORY_CARE_PROVIDER_SITE_OTHER): Payer: Medicare Other | Admitting: Endocrinology

## 2021-08-28 ENCOUNTER — Encounter: Payer: Self-pay | Admitting: Endocrinology

## 2021-08-28 ENCOUNTER — Other Ambulatory Visit: Payer: Self-pay | Admitting: Nurse Practitioner

## 2021-08-28 VITALS — BP 130/88 | HR 82 | Ht 65.0 in | Wt 216.2 lb

## 2021-08-28 DIAGNOSIS — E890 Postprocedural hypothyroidism: Secondary | ICD-10-CM | POA: Diagnosis not present

## 2021-08-28 DIAGNOSIS — E1169 Type 2 diabetes mellitus with other specified complication: Secondary | ICD-10-CM

## 2021-08-28 DIAGNOSIS — E042 Nontoxic multinodular goiter: Secondary | ICD-10-CM | POA: Diagnosis not present

## 2021-08-28 LAB — POCT GLYCOSYLATED HEMOGLOBIN (HGB A1C): Hemoglobin A1C: 6.1 % — AB (ref 4.0–5.6)

## 2021-08-28 MED ORDER — MAGNESIUM OXIDE 400 MG PO TABS: 400.0000 mg | ORAL_TABLET | Freq: Two times a day (BID) | ORAL | 1 refills | Status: DC

## 2021-08-28 NOTE — Progress Notes (Signed)
? ?Subjective:  ? ? Patient ID: [name], female    DOB: 22-Dec-1945, 76 y.o.   MRN: [id number] ? ?HPI ?Pt returns for f/u of diabetes mellitus: ?DM type: 2 ?Dx'ed: 2012 ?Complications: CRI ?Therapy: no medication recently ?GDM: never ?DKA: never ?Severe hypoglycemia: never ?Pancreatitis: never ?Pancreatic imaging: normal on 2020 CT.  ?SDOH: none ?Other: she has never been on insulin.  ?Interval history: none.   ?Pt also has Remy (dx'ed 2004: when she developed hyperthyroidism in 2010, she had RAI rx; she has been on synthroid since then; f/u US in 2022 was unchanged; in 2021, pt requested bx of the left lobe nodule--beth cat 1).  She takes synthroid as rx'ed.   ?Past Medical History:  ?Diagnosis Date  ? Allergy   ? ANEMIA, IRON DEFICIENCY 05/08/2009  ? Angina   ? ASYMPTOMATIC POSTMENOPAUSAL STATUS 10/11/2008  ? Blood transfusion   ? Blood transfusion without reported diagnosis   ? Breast cancer ([place]) 09/29/2011  ? invasive grade III ductal ca,assoc high grade dcis,ER/PR=neg; left breast  ? C. difficile colitis   ? Cataract   ? Diverticulosis of colon (without mention of hemorrhage)   ? Esophageal reflux 06/12/2008  ? Gastroparesis   ? GOITER, MULTINODULAR 04/02/2009  ? Gout, unspecified   ? H/O hiatal hernia   ? History of kidney stones   ? History of lower GI bleeding   ? History of radiation therapy 02/08/12-03/25/12  ? left breast,total 61gy  ? Hypokalemia 05/11/2013  ? Hypomagnesemia   ? HYPOTHYROIDISM, POST-RADIATION 08/13/2009  ? Internal hemorrhoids without mention of complication   ? Kidney stones   ? "several"  ? Leukopenia   ? Migraines   ? Obesity   ? On supplemental oxygen therapy   ? at night while sleeping - patient did not want to undergo sleep study  ? Osteoarthrosis, unspecified whether generalized or localized, unspecified site   ? Other and unspecified hyperlipidemia   ? Personal history of chemotherapy 2013  ? Personal history of radiation therapy 2013  ? left  ? PONV (postoperative nausea  and vomiting)   ? PUD (peptic ulcer disease)   ? Short bowel syndrome   ? Shortness of breath on exertion   ? "sometimes"  ? Stricture and stenosis of esophagus   ? Thyrotoxicosis without mention of goiter or other cause, without mention of thyrotoxic crisis or storm   ? Type II or unspecified type diabetes mellitus without mention of complication, not stated as uncontrolled   ? no med in years diet controled  ? Unspecified essential hypertension   ? UTI (urinary tract infection)   ? Varicose veins   ? VITAMIN B12 DEFICIENCY 08/30/2009  ? ? ?Past Surgical History:  ?Procedure Laterality Date  ? ABDOMINAL ADHESION SURGERY  1980's thru 1990's  ? "several"  ? ABDOMINAL HYSTERECTOMY  1970's  ? with BSO  ? BREAST BIOPSY Left 08/13/11  ? left breast lower inner quadrant  ? BREAST BIOPSY Right 1985  ? Rt exc bx, benign  ? BREAST LUMPECTOMY Left 08/2011  ? BREAST LUMPECTOMY W/ NEEDLE LOCALIZATION  09/29/11  ? left  breast=lymph node,excision benign/ ER/PR=neg, her 2 Positive  ? BREAST REDUCTION SURGERY Bilateral 06/09/2021  ? Procedure: MAMMARY REDUCTION  (BREAST);  Surgeon: Cindra Presume, MD;  Location: [place];  Service: Plastics;  Laterality: Bilateral;  2 hours  ? BUNIONECTOMY  1970's  ? bilateral  ? CHOLECYSTECTOMY  1990's  ? COLON SURGERY    ? "several surgeries for  short bowel syndrome"  ? COLONOSCOPY  2012  ? multiple   ? DILATION AND CURETTAGE OF UTERUS    ? ESOPHAGOGASTRODUODENOSCOPY  2011  ? multiple   ? EXCISIONAL HEMORRHOIDECTOMY  11/10/2016  ? EYE SURGERY    ? "long time ago"  ? FLEXIBLE SIGMOIDOSCOPY  2011  ? multiple   ? KIDNEY STONE SURGERY  1990's  ? "tried to go up & get it but pushed it further up"  ? LITHOTRIPSY    ? "4 or 5 times"  ? MASTECTOMY W/ NODES PARTIAL  09/29/11  ? left  ? PORT-A-CATH REMOVAL Right 12/19/2013  ? Procedure: MINOR REMOVAL PORT-A-CATH;  Surgeon: Adin Hector, MD;  Location: [place];  Service: General;  Laterality: Right;  ? PORTACATH PLACEMENT  09/29/2011  ?  Procedure: INSERTION PORT-A-CATH;  Surgeon: Adin Hector, MD;  Location: [place];  Service: General;  Laterality: N/A;  ? SMALL INTESTINE SURGERY    ? Thyroid Ultrasound  12/1994 and 12/1995  ? TOTAL KNEE ARTHROPLASTY Right 06/05/2015  ? Procedure: RIGHT TOTAL KNEE ARTHROPLASTY;  Surgeon: Ninetta Lights, MD;  Location: [place];  Service: Orthopedics;  Laterality: Right;  ? UPPER GASTROINTESTINAL ENDOSCOPY    ? VEIN LIGATION AND STRIPPING  1980's  ? Right leg  ? ? ?Social History  ? ?Socioeconomic History  ? Marital status: Widowed  ?  Spouse name: Not on file  ? Number of children: 2  ? Years of education: 10  ? Highest education level: 10th grade  ?Occupational History  ? Occupation: RETIRED  ?Tobacco Use  ? Smoking status: Former  ?  Packs/day: 1.00  ?  Years: 10.00  ?  Pack years: 10.00  ?  Types: Cigarettes  ?  Quit date: 09/22/1985  ?  Years since quitting: 35.9  ? Smokeless tobacco: Never  ?Vaping Use  ? Vaping Use: Never used  ?Substance and Sexual Activity  ? Alcohol use: No  ? Drug use: No  ? Sexual activity: Not Currently  ?  Birth control/protection: Surgical  ?  Comment: 1st intercourse- 15, partners- 61  ?Other Topics Concern  ? Not on file  ?Social History Narrative  ? Not on file  ? ?Social Determinants of Health  ? ?Financial Resource Strain: High Risk  ? Difficulty of Paying Living Expenses: Hard  ?Food Insecurity: No Food Insecurity  ? Worried About Charity fundraiser in the Last Year: Never true  ? Ran Out of Food in the Last Year: Never true  ?Transportation Needs: No Transportation Needs  ? Lack of Transportation (Medical): No  ? Lack of Transportation (Non-Medical): No  ?Physical Activity: Inactive  ? Days of Exercise per Week: 0 days  ? Minutes of Exercise per Session: 0 min  ?Stress: No Stress Concern Present  ? Feeling of Stress : Not at all  ?Social Connections: Moderately Integrated  ? Frequency of Communication with Friends and Family: More than three times a week  ? Frequency of Social  Gatherings with Friends and Family: More than three times a week  ? Attends Religious Services: More than 4 times per year  ? Active Member of Clubs or Organizations: Yes  ? Attends Archivist Meetings: More than 4 times per year  ? Marital Status: Widowed  ?Intimate Partner Violence: Not At Risk  ? Fear of Current or Ex-Partner: No  ? Emotionally Abused: No  ? Physically Abused: No  ? Sexually Abused: No  ? ? ?Current Outpatient  Medications on File Prior to Visit  ?Medication Sig Dispense Refill  ? albuterol (VENTOLIN HFA) 108 (90 Base) MCG/ACT inhaler Inhale 2 puffs into the lungs every 6 (six) hours as needed for wheezing or shortness of breath. 54 g 4  ? amitriptyline (ELAVIL) 50 MG tablet TAKE ONE TABLET BY MOUTH EVERY DAY AT BEDTIME (USE CAUTION - MAY CAUSE DROWSINESS) 90 tablet 3  ? calcium carbonate (TUMS - DOSED IN MG ELEMENTAL CALCIUM) 500 MG chewable tablet Chew 1 tablet by mouth daily as needed for indigestion or heartburn.    ? Calcium Carbonate-Vitamin D (CALCIUM-VITAMIN D) 600-200 MG-UNIT CAPS Take 1 capsule by mouth daily.    ? cholecalciferol (VITAMIN D) 25 MCG (1000 UNIT) tablet Take 1,000 Units by mouth daily.    ? Cyanocobalamin (VITAMIN B 12 PO) Take 1,000 mcg by mouth daily.    ? dicyclomine (BENTYL) 20 MG tablet Take 1 tablet (20 mg total) by mouth 3 (three) times daily as needed for spasms. 360 tablet 3  ? ferrous sulfate 325 (65 FE) MG tablet Take 1 tablet (325 mg total) by mouth daily with breakfast. 90 tablet 0  ? fluticasone (FLONASE) 50 MCG/ACT nasal spray Place 2 sprays into both nostrils daily. 48 g 3  ? HYDROcodone-acetaminophen (NORCO/VICODIN) 5-325 MG tablet Take 1 tablet by mouth every 6 (six) hours as needed. 15 tablet 0  ? levothyroxine (SYNTHROID) 175 MCG tablet TAKE ONE TABLET BY MOUTH EVERY DAY BEFORE BREAKFAST. 90 tablet 0  ? loratadine (CLARITIN) 10 MG tablet Take 1 tablet (10 mg total) by mouth daily. 90 tablet 1  ? lovastatin (MEVACOR) 20 MG tablet TAKE ONE  TABLET BY MOUTH EVERY DAY AT 6:00PM 90 tablet 3  ? meloxicam (MOBIC) 15 MG tablet Take 1 tablet (15 mg total) by mouth daily. 14 tablet 0  ? nitroGLYCERIN (NITROSTAT) 0.4 MG SL tablet Place 1 tablet (0.4 mg total) u

## 2021-08-28 NOTE — Patient Instructions (Addendum)
No medication is needed for the diabetes now.   ?Let's recheck the ultrasound.  you will receive a phone call, about a day and time for an appointment.   ?Please come back for a follow-up appointment in 6 months.   ?

## 2021-08-29 ENCOUNTER — Encounter: Payer: Self-pay | Admitting: Family Medicine

## 2021-08-29 ENCOUNTER — Ambulatory Visit: Payer: Self-pay

## 2021-08-29 ENCOUNTER — Ambulatory Visit (INDEPENDENT_AMBULATORY_CARE_PROVIDER_SITE_OTHER): Payer: Medicare Other | Admitting: Family Medicine

## 2021-08-29 VITALS — BP 120/88 | HR 82 | Ht 65.0 in | Wt 213.2 lb

## 2021-08-29 DIAGNOSIS — M25532 Pain in left wrist: Secondary | ICD-10-CM

## 2021-08-29 DIAGNOSIS — E876 Hypokalemia: Secondary | ICD-10-CM | POA: Diagnosis not present

## 2021-08-29 LAB — COMPREHENSIVE METABOLIC PANEL
ALT: 75 U/L — ABNORMAL HIGH (ref 0–35)
AST: 96 U/L — ABNORMAL HIGH (ref 0–37)
Albumin: 3.7 g/dL (ref 3.5–5.2)
Alkaline Phosphatase: 102 U/L (ref 39–117)
BUN: 14 mg/dL (ref 6–23)
CO2: 26 mEq/L (ref 19–32)
Calcium: 7.8 mg/dL — ABNORMAL LOW (ref 8.4–10.5)
Chloride: 102 mEq/L (ref 96–112)
Creatinine, Ser: 1.36 mg/dL — ABNORMAL HIGH (ref 0.40–1.20)
GFR: 37.6 mL/min — ABNORMAL LOW (ref >60.00)
Glucose, Bld: 79 mg/dL (ref 70–99)
Potassium: 4.6 mEq/L (ref 3.5–5.1)
Sodium: 137 mEq/L (ref 135–145)
Total Bilirubin: 0.5 mg/dL (ref 0.2–1.2)
Total Protein: 8.1 g/dL (ref 6.0–8.3)

## 2021-08-29 MED ORDER — COLCHICINE 0.6 MG PO TABS: 0.6000 mg | ORAL_TABLET | Freq: Every day | ORAL | 2 refills | Status: DC | PRN

## 2021-08-29 MED ORDER — ALLOPURINOL 300 MG PO TABS: 300.0000 mg | ORAL_TABLET | Freq: Every day | ORAL | 3 refills | Status: DC

## 2021-08-29 MED ORDER — ALLOPURINOL 300 MG PO TABS: 300.0000 mg | ORAL_TABLET | Freq: Two times a day (BID) | ORAL | 3 refills | Status: DC

## 2021-08-29 NOTE — Patient Instructions (Signed)
Good to see you today. ? ?I've prescribed some new medications.  Pick those up at your pharmacy ASAP. ? ?Please get labs today before you leave. ? ?Follow-up as needed. ?

## 2021-08-29 NOTE — Progress Notes (Signed)
? ?I, [name], LAT, ATC, am serving as scribe for Dr. [name]. ? ?[name] is a 76 y.o. female who presents to [place] at [facility] today for L wrist pain. Pt hasn't been seen by Dr. Georgina Snell on 05/22/20 for L hip and lumbar radiculopathy. Pt was previously seen by Dr. Glennon Mac initially on 06/27/21 and a f/u on 07/04/21 and was given a wrist brace and rx meloxicam. Pt has a hx of gout and was seen by her PCP on 08/07/21 and labs were obtained and she was prescribed allopurinol. Today, pt states that her L wrist is intermittent in nature.  She states that she ate some BBQ yesterday and last night her wrist was hurting and swelling.  She locates her pain to her L ulnar wrist.  She reports swelling. ? ?She has been taking allopurinol 100 mg for years.  This dose has been stable and she was currently on that dose when her uric acid was checked earlier this month. ? ?Dx testing: 08/07/21 Labs (uric acid = 8.3) ? 06/27/21 L hand XR ? ?Pertinent review of systems: No fevers and chills ? ?Relevant historical information: Gout.  Hypokalemia and hypomagnesia  ? ? ?Exam:  ?BP 120/88 (BP Location: Right Arm, Patient Position: Sitting, Cuff Size: Normal)   Pulse 82   Ht '5\' 5"'$  (1.651 m)   Wt 213 lb 3.2 oz (96.7 kg)   SpO2 97%   BMI 35.48 kg/m?  ?General: Well Developed, well nourished, and in no acute distress.  ? ?MSK: Left wrist mild swelling dorsal wrist.  Mildly tender palpation. ?Decreased wrist motion. ? ? ? ?Lab and Radiology Results ?  Chemistry   ? ?Lab Results  ?Component Value Date  ? LABURIC 8.3 (H) 08/07/2021  ? ?  Chemistry   ?   ?Component Value Date/Time  ? NA 140 08/22/2021 1506  ? NA 144 08/28/2020 1216  ? NA 142 08/18/2016 1032  ? K 2.8 (LL) 08/22/2021 1506  ? K 4.6 08/18/2016 1032  ? CL 96 08/22/2021 1506  ? CL 107 10/20/2012 1102  ? CO2 35 (H) 08/22/2021 1506  ? CO2 26 08/18/2016 1032  ? BUN 15 08/22/2021 1506  ? BUN 17 08/28/2020 1216  ? BUN 22.5 08/18/2016 1032  ?  CREATININE 1.03 08/22/2021 1506  ? CREATININE 1.03 (H) 02/12/2020 1123  ? CREATININE 1.1 08/18/2016 1032  ?    ?Component Value Date/Time  ? CALCIUM 6.5 (L) 08/22/2021 1506  ? CALCIUM 6.5 (L) 08/22/2021 1506  ? CALCIUM 9.8 08/18/2016 1032  ? ALKPHOS 71 08/22/2021 1506  ? ALKPHOS 78 08/18/2016 1032  ? AST 55 (H) 08/22/2021 1506  ? AST 32 08/18/2016 1032  ? ALT 28 08/22/2021 1506  ? ALT 30 08/18/2016 1032  ? BILITOT 0.7 08/22/2021 1506  ? BILITOT 0.66 08/18/2016 1032  ?  ? ? ? ? ?Assessment and Plan: ?76 y.o. female with left wrist pain and swelling thought to be due to gout.  She had some benefit with NSAIDs which would help gout. ?Her uric acid level is significantly elevated despite allopurinol 100 mg.  Plan to increase allopurinol to 300 mg but also start colchicine prophylaxis.  Colchicine now should help reduce the pain that she currently has in her wrist from what I think is gout and also prevent gout flares while I am adjusting the allopurinol dose.  I recommend that she recheck in 1 month to follow-up her uric acid. ? ? ?Additionally on lab  review her potassium was significantly low when checked on March 24.  I will go ahead and check her metabolic panel again today to make sure its not still critically low.  It looks to be that one of the fundamental cause of her hypokalemia was probably her low magnesium which is currently being corrected.  ? ?PDMP not reviewed this encounter. ?Orders Placed This Encounter  ?Procedures  ? Comprehensive metabolic panel  ?  Standing Status:   Future  ?  Number of Occurrences:   1  ?  Standing Expiration Date:   09/28/2021  ?  Order Specific Question:   Has the patient fasted?  ?  Answer:   No  ? ?Meds ordered this encounter  ?Medications  ? colchicine 0.6 MG tablet  ?  Sig: Take 1 tablet (0.6 mg total) by mouth daily as needed (gout or psuedogout pain).  ?  Dispense:  30 tablet  ?  Refill:  2  ? DISCONTD: allopurinol (ZYLOPRIM) 300 MG tablet  ?  Sig: Take 1 tablet (300 mg  total) by mouth 2 (two) times daily.  ?  Dispense:  60 tablet  ?  Refill:  3  ? allopurinol (ZYLOPRIM) 300 MG tablet  ?  Sig: Take 1 tablet (300 mg total) by mouth daily.  ?  Dispense:  30 tablet  ?  Refill:  3  ?  Corrected dose  ? ? ? ?Discussed warning signs or symptoms. Please see discharge instructions. Patient expresses understanding. ? ? ?The above documentation has been reviewed and is accurate and complete [name], M.D. ? ? ?

## 2021-09-01 NOTE — Progress Notes (Signed)
Potassium looks much better.  Calcium has also improved.

## 2021-09-02 DIAGNOSIS — Z20822 Contact with and (suspected) exposure to covid-19: Secondary | ICD-10-CM | POA: Diagnosis not present

## 2021-09-03 DIAGNOSIS — Z20828 Contact with and (suspected) exposure to other viral communicable diseases: Secondary | ICD-10-CM | POA: Diagnosis not present

## 2021-09-03 DIAGNOSIS — Z1152 Encounter for screening for COVID-19: Secondary | ICD-10-CM | POA: Diagnosis not present

## 2021-09-04 ENCOUNTER — Ambulatory Visit: Payer: Medicare Other | Admitting: Internal Medicine

## 2021-09-08 ENCOUNTER — Ambulatory Visit
Admission: RE | Admit: 2021-09-08 | Discharge: 2021-09-08 | Disposition: A | Discharge status: Home / Self Care | Payer: Medicare Other | Source: Ambulatory Visit | Attending: Endocrinology | Admitting: Endocrinology

## 2021-09-08 DIAGNOSIS — E041 Nontoxic single thyroid nodule: Secondary | ICD-10-CM | POA: Diagnosis not present

## 2021-09-08 DIAGNOSIS — E042 Nontoxic multinodular goiter: Secondary | ICD-10-CM

## 2021-09-10 ENCOUNTER — Ambulatory Visit: Payer: Medicare Other | Admitting: Internal Medicine

## 2021-09-11 ENCOUNTER — Telehealth: Payer: Self-pay | Admitting: Physician Assistant

## 2021-09-11 NOTE — Telephone Encounter (Signed)
Patient called to report that her left breast is hardened while her right breast remains soft after she has had sx. She has some concerns about this and would like to consult.  SX 1/9 Breast Reduction ?

## 2021-09-13 DIAGNOSIS — Z20822 Contact with and (suspected) exposure to covid-19: Secondary | ICD-10-CM | POA: Diagnosis not present

## 2021-09-23 DIAGNOSIS — Z20822 Contact with and (suspected) exposure to covid-19: Secondary | ICD-10-CM | POA: Diagnosis not present

## 2021-09-24 ENCOUNTER — Ambulatory Visit (INDEPENDENT_AMBULATORY_CARE_PROVIDER_SITE_OTHER): Payer: Medicare Other | Admitting: Plastic Surgery

## 2021-09-24 DIAGNOSIS — Z9889 Other specified postprocedural states: Secondary | ICD-10-CM | POA: Diagnosis not present

## 2021-09-24 DIAGNOSIS — Z20822 Contact with and (suspected) exposure to covid-19: Secondary | ICD-10-CM | POA: Diagnosis not present

## 2021-09-24 NOTE — Progress Notes (Signed)
? ?  Referring Provider ?Hoyt Koch, MD ?[address],  [place] [address]  ? ?CC:  ?Chief Complaint  ?Patient presents with  ? Follow-up  ?   ? ?[name] is an 76 y.o. female.  ?HPI: Patient presents about 4 months out from bilateral breast reduction.  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  She feels that the left side is shaped a little bit differently and is firmer than the right and wants to discuss the reasons for that. ? ?Review of Systems ?General: Denies fevers and chills ? ?Physical Exam ? ?  08/29/2021  ? 11:09 AM 08/28/2021  ?  9:37 AM 08/21/2021  ?  2:46 PM  ?Vitals with BMI  ?Height '5\' 5"'$  '5\' 5"'$    ?Weight 213 lbs 3 oz 216 lbs 3 oz   ?BMI 35.48 35.98   ?Systolic 528 413 244  ?Diastolic 88 88 76  ?Pulse 82 82   ?  ?General:  No acute distress,  Alert and oriented, Non-Toxic, Normal speech and affect ?Examination shows well-healed incision from bilateral breast reduction.  There is some asymmetries in the contour.  The left side is narrower and more constricted than the right.  It is firmer unquestionably.  I do not detect any subcutaneous fluid.  Nipple areolar complexes are viable. ? ?Assessment/Plan ?Had a long discussion with the patient about the outcome.  Ultimately she does have some asymmetries on the left side.  I did explain due to her history of radiation and significant lumpectomy that the contour on that side is expected to be different.  I would say that the right breast is wider and lower than the left and if she did desire revision then I would be happy to attempt to make the right side match closer to the left side.  She is not that bothered at this point to pursue a revision but just wanted to discuss the reasons for the asymmetries.  Ultimately she is satisfied and her questions are answered and we will see her again on an as-needed basis. ? ?Cindra Presume ?09/24/2021, 12:24 PM  ? ? ?    ?

## 2021-09-25 ENCOUNTER — Encounter: Payer: Self-pay | Admitting: Internal Medicine

## 2021-09-25 ENCOUNTER — Ambulatory Visit (INDEPENDENT_AMBULATORY_CARE_PROVIDER_SITE_OTHER): Payer: Medicare Other | Admitting: Internal Medicine

## 2021-09-25 VITALS — BP 130/80 | HR 78 | Resp 18 | Ht 65.0 in | Wt 209.0 lb

## 2021-09-25 DIAGNOSIS — R7989 Other specified abnormal findings of blood chemistry: Secondary | ICD-10-CM | POA: Diagnosis not present

## 2021-09-25 DIAGNOSIS — M1A9XX0 Chronic gout, unspecified, without tophus (tophi): Secondary | ICD-10-CM | POA: Diagnosis not present

## 2021-09-25 DIAGNOSIS — E876 Hypokalemia: Secondary | ICD-10-CM | POA: Diagnosis not present

## 2021-09-25 DIAGNOSIS — D500 Iron deficiency anemia secondary to blood loss (chronic): Secondary | ICD-10-CM

## 2021-09-25 DIAGNOSIS — K912 Postsurgical malabsorption, not elsewhere classified: Secondary | ICD-10-CM

## 2021-09-25 LAB — CBC
HCT: 28.8 % — ABNORMAL LOW (ref 36.0–46.0)
Hemoglobin: 9.2 g/dL — ABNORMAL LOW (ref 12.0–15.0)
MCHC: 32 g/dL (ref 30.0–36.0)
MCV: 89.8 fl (ref 78.0–100.0)
Platelets: 221 10*3/uL (ref 150.0–400.0)
RBC: 3.21 Mil/uL — ABNORMAL LOW (ref 3.87–5.11)
RDW: 17.8 % — ABNORMAL HIGH (ref 11.5–15.5)
WBC: 4.3 10*3/uL (ref 4.0–10.5)

## 2021-09-25 LAB — URIC ACID: Uric Acid, Serum: 2.3 mg/dL — ABNORMAL LOW (ref 2.4–7.0)

## 2021-09-25 LAB — COMPREHENSIVE METABOLIC PANEL
ALT: 19 U/L (ref 0–35)
AST: 31 U/L (ref 0–37)
Albumin: 4 g/dL (ref 3.5–5.2)
Alkaline Phosphatase: 66 U/L (ref 39–117)
BUN: 17 mg/dL (ref 6–23)
CO2: 33 mEq/L — ABNORMAL HIGH (ref 19–32)
Calcium: 9.3 mg/dL (ref 8.4–10.5)
Chloride: 101 mEq/L (ref 96–112)
Creatinine, Ser: 0.99 mg/dL (ref 0.40–1.20)
GFR: 55.01 mL/min — ABNORMAL LOW (ref >60.00)
Glucose, Bld: 94 mg/dL (ref 70–99)
Potassium: 3.3 mEq/L — ABNORMAL LOW (ref 3.5–5.1)
Sodium: 142 mEq/L (ref 135–145)
Total Bilirubin: 0.7 mg/dL (ref 0.2–1.2)
Total Protein: 7.8 g/dL (ref 6.0–8.3)

## 2021-09-25 LAB — MAGNESIUM: Magnesium: 1.2 mg/dL — ABNORMAL LOW (ref 1.5–2.5)

## 2021-09-25 NOTE — Progress Notes (Signed)
? ?  Subjective:  ? ?Patient ID: [name], female    DOB: May 01, 1946, 76 y.o.   MRN: [id number] ? ?HPI ?The patient is a 76 YO female coming in for follow up. ? ?Review of Systems  ?Constitutional: Negative.   ?HENT: Negative.    ?Eyes: Negative.   ?Respiratory:  Negative for cough, chest tightness and shortness of breath.   ?Cardiovascular:  Negative for chest pain, palpitations and leg swelling.  ?Gastrointestinal:  Negative for abdominal distention, abdominal pain, constipation, diarrhea, nausea and vomiting.  ?Musculoskeletal: Negative.   ?Skin: Negative.   ?Neurological: Negative.   ?Psychiatric/Behavioral: Negative.    ? ?Objective:  ?Physical Exam ?Constitutional:   ?   Appearance: She is well-developed.  ?HENT:  ?   Head: Normocephalic and atraumatic.  ?Cardiovascular:  ?   Rate and Rhythm: Normal rate and regular rhythm.  ?Pulmonary:  ?   Effort: Pulmonary effort is normal. No respiratory distress.  ?   Breath sounds: Normal breath sounds. No wheezing or rales.  ?Abdominal:  ?   General: Bowel sounds are normal. There is no distension.  ?   Palpations: Abdomen is soft.  ?   Tenderness: There is no abdominal tenderness. There is no rebound.  ?Musculoskeletal:  ?   Cervical back: Normal range of motion.  ?Skin: ?   General: Skin is warm and dry.  ?Neurological:  ?   Mental Status: She is alert and oriented to person, place, and time.  ?   Coordination: Coordination normal.  ? ? ?Vitals:  ? 09/25/21 0943  ?BP: 130/80  ?Pulse: 78  ?Resp: 18  ?SpO2: 98%  ?Weight: 209 lb (94.8 kg)  ?Height: '5\' 5"'$  (1.651 m)  ? ? ?This visit occurred during the SARS-CoV-2 public health emergency.  Safety protocols were in place, including screening questions prior to the visit, additional usage of staff PPE, and extensive cleaning of exam room while observing appropriate contact time as indicated for disinfecting solutions.  ? ?Assessment & Plan:  ? ?

## 2021-09-25 NOTE — Assessment & Plan Note (Signed)
Checking CMP and magnesium today. She has finished replacement and is not taking oral potassium currently. Adjust as needed.  ?

## 2021-09-25 NOTE — Assessment & Plan Note (Signed)
She had low magnesium and low potassium related to this. She is taking magnesium replacement still and will check magnesium levels today. Given her short gut will likely recommend to stay on this given severity of low levels and symptoms associated.  ?

## 2021-09-25 NOTE — Assessment & Plan Note (Signed)
Checking CBC for stability today. No clinical signs of blood loss.  ?

## 2021-09-25 NOTE — Assessment & Plan Note (Signed)
On most recent labs noted elevated LFTs. Repeating CMP today. If persistently elevated may need imaging to assess liver. Prior gallbladder removal.  ?

## 2021-09-25 NOTE — Patient Instructions (Signed)
We will check the labs today. 

## 2021-09-25 NOTE — Assessment & Plan Note (Signed)
She had dose change to allopurinol about 1 month ago and is due uric acid level which is ordered today. Adjust dosing for goal uric acid <6. No flare of gout today.  ?

## 2021-09-25 NOTE — Assessment & Plan Note (Signed)
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  ?

## 2021-09-26 NOTE — Progress Notes (Signed)
? ?I, [name], LAT, ATC, am serving as scribe for Dr. [name]. ? ?[name] is a 76 y.o. female who presents to [place] at [facility] today for f/u of L wrist/hand pain.  She was last seen by Dr. Georgina Snell on 08/29/21 for f/u of L wrist pain thought to be due to gout and her allopurinol dosage was increased from 100 to '300mg'$ .  She was also prescribed colchicine.  Today, pt reports she has been taking both gout rx and her L wrist/hand is feeling good. Pt would like the rx changed to be through her mail order pharmacy. ? ?Dx testing: 09/25/21 Labs (uric acid = 2.3 mg/dL) ?08/07/21 Labs (uric acid = 8.3) ?            06/27/21 L hand XR ? ?Pertinent review of systems: No fevers or chills ? ?Relevant historical information: Short gut syndrome ? ? ?Exam:  ?BP 118/76   Pulse 81   Ht '5\' 5"'$  (1.651 m)   Wt 207 lb 12.8 oz (94.3 kg)   SpO2 98%   BMI 34.58 kg/m?  ?General: Well Developed, well nourished, and in no acute distress.  ? ?MSK: Left wrist nontender normal motion. ? ? ? ?Lab and Radiology Results ?Lab Results  ?Component Value Date  ? LABURIC 2.3 (L) 09/25/2021  ? ?  Chemistry   ?   ?Component Value Date/Time  ? NA 142 09/25/2021 1000  ? NA 144 08/28/2020 1216  ? NA 142 08/18/2016 1032  ? K 3.3 (L) 09/25/2021 1000  ? K 4.6 08/18/2016 1032  ? CL 101 09/25/2021 1000  ? CL 107 10/20/2012 1102  ? CO2 33 (H) 09/25/2021 1000  ? CO2 26 08/18/2016 1032  ? BUN 17 09/25/2021 1000  ? BUN 17 08/28/2020 1216  ? BUN 22.5 08/18/2016 1032  ? CREATININE 0.99 09/25/2021 1000  ? CREATININE 1.03 (H) 02/12/2020 1123  ? CREATININE 1.1 08/18/2016 1032  ?    ?Component Value Date/Time  ? CALCIUM 9.3 09/25/2021 1000  ? CALCIUM 9.8 08/18/2016 1032  ? ALKPHOS 66 09/25/2021 1000  ? ALKPHOS 78 08/18/2016 1032  ? AST 31 09/25/2021 1000  ? AST 32 08/18/2016 1032  ? ALT 19 09/25/2021 1000  ? ALT 30 08/18/2016 1032  ? BILITOT 0.7 09/25/2021 1000  ? BILITOT 0.66 08/18/2016 1032  ?  ? ? ? ? ?Assessment and Plan: ?76  y.o. female with gout.  Clinically much improved.  Uric acid is arguably a bit too well controlled with at 2.3.  I think it may have overshot with allopurinol 300 mg daily.  We will back off to 200 mg daily and check back with me as needed.  This labs should be checked every 6 to 12 months.  We discussed how to take colchicine at this point as needed. ? ? ? ?PDMP not reviewed this encounter. ?No orders of the defined types were placed in this encounter. ? ?Meds ordered this encounter  ?Medications  ? allopurinol (ZYLOPRIM) 100 MG tablet  ?  Sig: Take 2 tablets (200 mg total) by mouth daily.  ?  Dispense:  180 tablet  ?  Refill:  3  ?  Corrected dose  ? colchicine 0.6 MG tablet  ?  Sig: Take 1 tablet (0.6 mg total) by mouth daily as needed (gout or psuedogout pain).  ?  Dispense:  90 tablet  ?  Refill:  2  ? ? ? ?Discussed warning signs or symptoms. Please see  discharge instructions. Patient expresses understanding. ? ? ?The above documentation has been reviewed and is accurate and complete [name], M.D. ? ? ?

## 2021-09-29 ENCOUNTER — Ambulatory Visit (INDEPENDENT_AMBULATORY_CARE_PROVIDER_SITE_OTHER): Payer: Medicare Other | Admitting: Family Medicine

## 2021-09-29 VITALS — BP 118/76 | HR 81 | Ht 65.0 in | Wt 207.8 lb

## 2021-09-29 DIAGNOSIS — Z20822 Contact with and (suspected) exposure to covid-19: Secondary | ICD-10-CM | POA: Diagnosis not present

## 2021-09-29 DIAGNOSIS — M1A0320 Idiopathic chronic gout, left wrist, without tophus (tophi): Secondary | ICD-10-CM | POA: Diagnosis not present

## 2021-09-29 MED ORDER — COLCHICINE 0.6 MG PO TABS: 0.6000 mg | ORAL_TABLET | Freq: Every day | ORAL | 2 refills | Status: DC | PRN

## 2021-09-29 MED ORDER — ALLOPURINOL 100 MG PO TABS: 200.0000 mg | ORAL_TABLET | Freq: Every day | ORAL | 3 refills | Status: DC

## 2021-09-29 NOTE — Patient Instructions (Addendum)
Thank you for coming in today.  ? ?I've sent a prescription for to your Mail Order pharmacy ([facility]).  ? ?Recheck back as needed ?

## 2021-10-02 ENCOUNTER — Other Ambulatory Visit: Payer: Self-pay | Admitting: Internal Medicine

## 2021-10-02 MED ORDER — POTASSIUM CHLORIDE CRYS ER 20 MEQ PO TBCR: 20.0000 meq | EXTENDED_RELEASE_TABLET | Freq: Every day | ORAL | 3 refills | Status: DC

## 2021-10-06 DIAGNOSIS — Z20822 Contact with and (suspected) exposure to covid-19: Secondary | ICD-10-CM | POA: Diagnosis not present

## 2021-10-14 ENCOUNTER — Other Ambulatory Visit: Payer: Self-pay | Admitting: Internal Medicine

## 2021-10-14 DIAGNOSIS — Z1231 Encounter for screening mammogram for malignant neoplasm of breast: Secondary | ICD-10-CM

## 2021-11-14 ENCOUNTER — Other Ambulatory Visit: Payer: Self-pay

## 2021-11-14 DIAGNOSIS — E890 Postprocedural hypothyroidism: Secondary | ICD-10-CM

## 2021-11-14 MED ORDER — LEVOTHYROXINE SODIUM 175 MCG PO TABS: ORAL_TABLET | ORAL | 0 refills | Status: DC

## 2021-11-24 ENCOUNTER — Ambulatory Visit
Admission: RE | Admit: 2021-11-24 | Discharge: 2021-11-24 | Disposition: A | Discharge status: Home / Self Care | Payer: Medicare Other | Source: Ambulatory Visit | Attending: Internal Medicine | Admitting: Internal Medicine

## 2021-11-24 DIAGNOSIS — Z1231 Encounter for screening mammogram for malignant neoplasm of breast: Secondary | ICD-10-CM

## 2021-12-09 ENCOUNTER — Encounter: Payer: Self-pay | Admitting: Gastroenterology

## 2021-12-09 ENCOUNTER — Ambulatory Visit (INDEPENDENT_AMBULATORY_CARE_PROVIDER_SITE_OTHER): Payer: Medicare Other | Admitting: Gastroenterology

## 2021-12-09 VITALS — BP 120/72 | HR 64 | Ht 65.0 in | Wt 204.0 lb

## 2021-12-09 DIAGNOSIS — R159 Full incontinence of feces: Secondary | ICD-10-CM | POA: Diagnosis not present

## 2021-12-09 DIAGNOSIS — K219 Gastro-esophageal reflux disease without esophagitis: Secondary | ICD-10-CM | POA: Diagnosis not present

## 2021-12-09 DIAGNOSIS — R197 Diarrhea, unspecified: Secondary | ICD-10-CM

## 2021-12-09 MED ORDER — DICYCLOMINE HCL 20 MG PO TABS: 20.0000 mg | ORAL_TABLET | Freq: Three times a day (TID) | ORAL | 11 refills | Status: DC

## 2021-12-09 NOTE — Therapy (Unsigned)
[facility] OUTPATIENT PHYSICAL THERAPY FEMALE PELVIC EVALUATION   Patient Name: [name] MRN: [id number] DOB:01-11-1946, 76 y.o., female Today's Date: 12/10/2021   PT End of Session - 12/10/21 1231     Visit Number 1    Date for PT Re-Evaluation 03/04/22    Authorization Type medicare    Authorization - Visit Number 1    Authorization - Number of Visits 10    PT Start Time 4709    PT Stop Time 1225    PT Time Calculation (min) 40 min    Activity Tolerance Patient tolerated treatment well    Behavior During Therapy WFL for tasks assessed/performed             Past Medical History:  Diagnosis Date   Allergy    ANEMIA, IRON DEFICIENCY 05/08/2009   Angina    ASYMPTOMATIC POSTMENOPAUSAL STATUS 10/11/2008   Blood transfusion    Blood transfusion without reported diagnosis    Breast cancer ([place]) 09/29/2011   invasive grade III ductal ca,assoc high grade dcis,ER/PR=neg; left breast   C. difficile colitis    Cataract    Diverticulosis of colon (without mention of hemorrhage)    Esophageal reflux 06/12/2008   Gastroparesis    GOITER, MULTINODULAR 04/02/2009   Gout, unspecified    H/O hiatal hernia    History of kidney stones    History of lower GI bleeding    History of radiation therapy 02/08/12-03/25/12   left breast,total 61gy   Hypokalemia 05/11/2013   Hypomagnesemia    HYPOTHYROIDISM, POST-RADIATION 08/13/2009   Internal hemorrhoids without mention of complication    Kidney stones    "several"   Leukopenia    Migraines    Obesity    On supplemental oxygen therapy    at night while sleeping - patient did not want to undergo sleep study   Osteoarthrosis, unspecified whether generalized or localized, unspecified site    Other and unspecified hyperlipidemia    Personal history of chemotherapy 2013   Personal history of radiation therapy 2013   left   PONV (postoperative nausea and vomiting)    PUD (peptic ulcer disease)    Short bowel syndrome    Shortness of  breath on exertion    "sometimes"   Stricture and stenosis of esophagus    Thyrotoxicosis without mention of goiter or other cause, without mention of thyrotoxic crisis or storm    Type II or unspecified type diabetes mellitus without mention of complication, not stated as uncontrolled    no med in years diet controled   Unspecified essential hypertension    UTI (urinary tract infection)    Varicose veins    VITAMIN B12 DEFICIENCY 08/30/2009   Past Surgical History:  Procedure Laterality Date   ABDOMINAL ADHESION SURGERY  1980's thru 1990's   "several"   ABDOMINAL HYSTERECTOMY  1970's   with BSO   BREAST BIOPSY Left 08/13/11   left breast lower inner quadrant   BREAST BIOPSY Right 1985   Rt exc bx, benign   BREAST LUMPECTOMY Left 08/2011   BREAST LUMPECTOMY W/ NEEDLE LOCALIZATION  09/29/11   left  breast=lymph node,excision benign/ ER/PR=neg, her 2 Positive   BREAST REDUCTION SURGERY Bilateral 06/09/2021   Procedure: MAMMARY REDUCTION  (BREAST);  Surgeon: Cindra Presume, MD;  Location: [facility];  Service: Plastics;  Laterality: Bilateral;  2 hours   BUNIONECTOMY  1970's   bilateral   CHOLECYSTECTOMY  1990's   COLON SURGERY     "several surgeries  for short bowel syndrome"   COLONOSCOPY  2012   multiple    DILATION AND CURETTAGE OF UTERUS     ESOPHAGOGASTRODUODENOSCOPY  2011   multiple    EXCISIONAL HEMORRHOIDECTOMY  11/10/2016   EYE SURGERY     "long time ago"   FLEXIBLE SIGMOIDOSCOPY  2011   multiple    KIDNEY STONE SURGERY  1990's   "tried to go up & get it but pushed it further up"   LITHOTRIPSY     "4 or 5 times"   MASTECTOMY W/ NODES PARTIAL  09/29/11   left   PORT-A-CATH REMOVAL Right 12/19/2013   Procedure: MINOR REMOVAL PORT-A-CATH;  Surgeon: Adin Hector, MD;  Location: [facility];  Service: General;  Laterality: Right;   PORTACATH PLACEMENT  09/29/2011   Procedure: INSERTION PORT-A-CATH;  Surgeon: Adin Hector, MD;  Location: [facility];  Service:  General;  Laterality: N/A;   SMALL INTESTINE SURGERY     Thyroid Ultrasound  12/1994 and 12/1995   TOTAL KNEE ARTHROPLASTY Right 06/05/2015   Procedure: RIGHT TOTAL KNEE ARTHROPLASTY;  Surgeon: Ninetta Lights, MD;  Location: [facility];  Service: Orthopedics;  Laterality: Right;   UPPER GASTROINTESTINAL ENDOSCOPY     VEIN LIGATION AND STRIPPING  1980's   Right leg   Patient Active Problem List   Diagnosis Date Noted   Elevated LFTs 09/25/2021   Abnormal vaginal bleeding 08/21/2021   Seasonal allergic rhinitis 08/07/2021   Asthmatic bronchitis 01/10/2021   Nocturnal hypoxemia 07/08/2020   Short gut syndrome 02/08/2019   DJD (degenerative joint disease) of knee 06/05/2015   Pain in joint, shoulder region 09/24/2014   Hemorrhoids without complication 75/91/6384   Pain in joint, lower leg 02/01/2014   Vitamin D deficiency 01/16/2014   Gastroparesis 09/14/2013   Nausea alone 09/04/2013   Iron deficiency anemia 08/28/2013   Hypomagnesemia 05/11/2013   Hypokalemia 05/11/2013   Breast cancer ([place]) 09/29/2011   Primary cancer of lower-inner quadrant of left female breast ([place]) 08/17/2011   Neck pain 07/29/2011   Routine general medical examination at a health care facility 06/28/2011   CLOSTRIDIUM DIFFICILE COLITIS 02/07/2010   Blind loop syndrome 10/01/2009   Vitamin B 12 deficiency 08/30/2009   Hypothyroidism following radioiodine therapy 08/13/2009   GOITER, MULTINODULAR 04/02/2009   BACK PAIN, CHRONIC 07/03/2008   Hyperlipidemia 12/10/2006   Gout 12/10/2006   HYPERTENSION 12/10/2006   OSTEOARTHRITIS 12/10/2006   ESOPHAGEAL STRICTURE 08/23/2002   HIATAL HERNIA 08/23/2002    PCP: Hoyt Koch, MD  REFERRING PROVIDER: Ladene Artist, MD  REFERRING DIAG:  R19.7 (ICD-10-CM) - Diarrhea, unspecified type  R15.9 (ICD-10-CM) - Incontinence of feces, unspecified fecal incontinence type    THERAPY DIAG:  Muscle weakness (generalized)  Other abnormalities of gait and  mobility  Abdominal pain, unspecified abdominal location  Rationale for Evaluation and Treatment Rehabilitation  ONSET DATE: 1.5 years ago  SUBJECTIVE:  SUBJECTIVE STATEMENT: Patient had bowel obstructions and has been operated 3 times. Her fecal leakage started after the last surgery. I have adhesions in the stomach from the surgerys.  Fluid intake: Yes: mostly water.      PAIN:  Are you having pain? Yes NPRS scale: 7-10/10 Pain location:  abdominal  Pain type: sharp Pain description: intermittent   Aggravating factors: depends on the food Relieving factors: medicateion  PAIN:  Are you having pain? Yes NPRS scale: 9/10 Pain location: pelvic floor Pain orientation: Lower  PAIN TYPE: shooting Pain description: intermittent  Aggravating factors: random pain Relieving factors: not sure   PRECAUTIONS: Other: breast cancer  WEIGHT BEARING RESTRICTIONS No  FALLS:  Has patient fallen in last 6 months? No  LIVING ENVIRONMENT: Lives with: lives with their family  OCCUPATION: retired  PLOF: [place] go out without fecal leakage  PERTINENT HISTORY:  Gastroparesis; right total knee replacement, Breast cancer with radiation, Mastectomy 09/29/11; Abdominal adhesion surgery; Abdominal Hysterectomy; Stage 3 kidney failure Sexual abuse: No  BOWEL MOVEMENT Pain with bowel movement: Yes  sometimes stomach pain Type of bowel movement:Type (Bristol Stool Scale) Type 4 and 6, Frequency daily, and Strain No Fully empty rectum: Yes:   Leakage: Yes: walking, random Pads: Yes: depends 3-4 Fiber supplement: No  URINATION Pain with urination: No Fully empty bladder: Yes:   Stream: Strong Urgency: Yes:   Frequency: average Leakage:  no    PREGNANCY Vaginal deliveries  3 Tearing No  Currently pregnant No     OBJECTIVE:   COGNITION:  Overall cognitive status: Within functional limits for tasks assessed     SENSATION:  Light touch: Appears intact  Proprioception: Appears intact  MUSCLE LENGTH: Hamstrings: Right decreased  Left decreased [name] test: Right positive  Left positive   L  FUNCTIONAL TESTS:  5 times sit to stand: 18 sec Timed up and go (TUG): 16 sec  GAIT: Distance walked: 10 feet Assistive device utilized: None Level of assistance: Complete Independence Comments: Patient walks with a flexed posture , decreased step length,                POSTURE: rounded shoulders, forward head, decreased lumbar lordosis, and flexed trunk    PELVIC ALIGNMENT:  LUMBARAROM/PROM  A/PROM A/PROM  eval  Flexion   Extension Decreased by 75%  Right lateral flexion Decreased by 25%  Left lateral flexion Decreased by 25%  Right rotation Decreased by 25%  Left rotation Decreased by 25%   (Blank rows = not tested)  LOWER EXTREMITY ROM:  Passive ROM Right eval Left eval  Hip flexion 105 100  Hip extension 5 0  Hip abduction 10 10  Hip internal rotation 25 10  Hip external rotation 30 45   (Blank rows = not tested)  LOWER EXTREMITY MMT:  MMT Right eval Left eval  Hip flexion 4/5 4/5  Hip extension 2/5 2/5  Hip abduction 2/5 2/5  Hip adduction 4/5 4/5  Hip internal rotation 4/5 4/5  Hip external rotation 4/5 4/5    PALPATION:   General  decreased mobility of abdominal scars, skin lays over the scars, tenderness throughout the abdomen, decreased lower rib cage mobility                External Perineal Exam skin seems thin around rectum                             Internal  Pelvic Floor minimal movement of the puborectalis, tenderness   Patient confirms identification and approves PT to assess internal pelvic floor and treatment Yes  PELVIC MMT:   MMT eval  Internal Anal Sphincter 2/5  External Anal Sphincter 2/5   Puborectalis 2/5  (Blank rows = not tested)        TONE: increased    TODAY'S TREATMENT  EVAL eval completed   PATIENT EDUCATION:  Education details: none today    HOME EXERCISE PROGRAM: None today  ASSESSMENT:  CLINICAL IMPRESSION: Patient is a 76 y.o. female who was seen today for physical therapy evaluation and treatment for fecal incontinence. Patient reports abdominal pain at level 7-10/10 and pelvic pain 9/10 that comes on randomly. Patient has abdominal scars with decreased mobility. She has tenderness throughout the abdomen. Patient has limited lumbar and hip ROM. She has weakness in her hips. She walks with flexed trunk, decreased hip extension and small steps Timed up and go is 16 and sit to stand 5 times is 18 seconds making her a risk of falls and not able to get to the bathroom in time. Pelvic floor strength is 2/5. Her puborectalis muscle comes forward minimally. She has tenderness located in levator ani and obturator internist. Patient wears 3-4 depends due to her fecal leakage. Patient will benefit from skilled therapy to improve mobility, strength and reduce her fecal leakage.     OBJECTIVE IMPAIRMENTS decreased activity tolerance, decreased balance, decreased coordination, decreased endurance, decreased mobility, difficulty walking, decreased ROM, decreased strength, increased fascial restrictions, impaired flexibility, and pain.   ACTIVITY LIMITATIONS continence, toileting, and locomotion level  PARTICIPATION LIMITATIONS: community activity  PERSONAL FACTORS Breast cancer with radiation, Mastectomy 09/29/11; Abdominal adhesion surgery; Abdominal Hysterectomy; Stage 3 kidney failure are also affecting patient's functional outcome.   REHAB POTENTIAL: Excellent  CLINICAL DECISION MAKING: Evolving/moderate complexity  EVALUATION COMPLEXITY: Moderate   GOALS: Goals reviewed with patient? Yes  SHORT TERM GOALS: Target date: 01/07/2022  Patient independent  with hip stretches and pelvic floor contraction.  Baseline: Goal status: INITIAL  2.  Patient is able to hug the therapist finger anally and pelvic floor able to lift to reduce fecal leakage.  Baseline:  Goal status: INITIAL  3.  Patient independent with scar massage to reduce abdominal pain.  Baseline:  Goal status: INITIAL   LONG TERM GOALS: Target date: 03/04/2022   Patient independent with advanced HEP for core, pelvic floor and hip strength to reduce fecal leakage.  Baseline:  Goal status: INITIAL  2.  Timed up and go test >/= 13 seconds so she is able to get to the bathroom in time.  Baseline:  Goal status: INITIAL  3.  Sit to stand </= 13 sec so she is able to get up from a chair to walk to the bathroom in time.  Baseline:  Goal status: INITIAL  4.  Pelvic floor strength >/= 3/5 so her fecal leakage </= 80% and she does not have to wear depends just a thin pad.  Baseline:  Goal status: INITIAL  5.  Abdominal pain decreased >/= 2/10 due to improved tissue and scar mobility.  Baseline:  Goal status: INITIAL  6.  Pelvic pain decreased </= 2/10 due to improved tissue mobility and increased strength Baseline:  Goal status: INITIAL  PLAN: PT FREQUENCY: 1-2x/week  PT DURATION: 12 weeks  PLANNED INTERVENTIONS: Therapeutic exercises, Therapeutic activity, Neuromuscular re-education, Balance training, Gait training, Patient/Family education, Joint mobilization, Dry Needling, Cryotherapy, Moist heat, scar mobilization, Biofeedback, and  Manual therapy  PLAN FOR NEXT SESSION: scar massage, work on anal tissue work, educate on scar massage, abdominal contraction, hip stretches.    Earlie Counts, PT 12/10/21 1:23 PM

## 2021-12-09 NOTE — Progress Notes (Signed)
    Assessment     Intermittent diarrhea with associated intermittent fecal incontinence GERD with a history of an esophageal stricture   Recommendations    Imodium 1-2 po tid prn, continue dicyclomine, IBgard 1-2 po tid prn, Kegel exercises 5 times/day, referral for pelvic floor PT Consider reduced MgOxide dose or another Mg formulation, discuss with PCP Continue pantoprazole 40 mg po bid and antireflux measures   HPI    This is a 76 year old female with intermittent diarrhea and intermittent fecal incontinence.  She relates several days or several weeks of normal daily bowel movements and then she will have unpredictable episodes of diarrhea with associated incontinence.  The diarrhea may last 2 to 3 days and then she resumes her prior bowel pattern.  She takes Imodium during the diarrhea phases which helps but does not resolve her diarrhea.  She cannot associate any particular food, beverage or medication with her diarrhea.  This pattern has been present for several years and has not changed.  Colonoscopy Dec 2020 - Preparation of the colon was fair. - One 6 mm polyp in the cecum, removed with a cold snare. Resected and retrieved. - Localized mild mucosal changes were found around the appendiceal orifice secondary to colitis. Biopsied. - Mild diverticulosis in the right colon. - Mild diverticulosis in the left colon. - Internal hemorrhoids. - The examination was otherwise normal on direct and retroflexion views.   Labs / Imaging       Latest Ref Rng & Units 09/25/2021   10:00 AM 08/29/2021   11:44 AM 08/22/2021    3:06 PM  Hepatic Function  Total Protein 6.0 - 8.3 g/dL 7.8  8.1  7.6   Albumin 3.5 - 5.2 g/dL 4.0  3.7  3.7   AST 0 - 37 U/L 31  96  55   ALT 0 - 35 U/L 19  75  28   Alk Phosphatase 39 - 117 U/L 66  102  71   Total Bilirubin 0.2 - 1.2 mg/dL 0.7  0.5  0.7        Latest Ref Rng & Units 09/25/2021   10:00 AM 08/21/2021   10:09 AM 05/29/2021   11:58 AM  CBC   WBC 4.0 - 10.5 K/uL 4.3  5.6  4.3   Hemoglobin 12.0 - 15.0 g/dL 9.2  9.3  10.0   Hematocrit 36.0 - 46.0 % 28.8  28.6  31.1   Platelets 150.0 - 400.0 K/uL 221.0  320.0  218     Current Medications, Allergies, Past Medical History, Past Surgical History, Family History and Social History were reviewed in [employer] record.   Physical Exam: General: Well developed, well nourished, no acute distress Head: Normocephalic and atraumatic Eyes: Sclerae anicteric, EOMI Ears: Normal auditory acuity Mouth: Not examined Lungs: Clear throughout to auscultation Heart: Regular rate and rhythm; no murmurs, rubs or bruits Abdomen: Soft, non tender and non distended. No masses, hepatosplenomegaly or hernias noted. Normal Bowel sounds Rectal: Not done Musculoskeletal: Symmetrical with no gross deformities  Pulses:  Normal pulses noted Extremities: No clubbing, cyanosis, edema or deformities noted Neurological: Alert oriented x 4, grossly nonfocal Psychological:  Alert and cooperative. Normal mood and affect   [name] Plan, MD 12/09/2021, 9:55 AM

## 2021-12-09 NOTE — Patient Instructions (Addendum)
Take your over the counter Imodium 1-2 tablets by mouth three times a day.   We have sent the following medications to your pharmacy for you to pick up at your convenience: dicyclomine.   You can take over the counter IBgard 1-2 tablets by mouth twice daily if your dicyclomine is not helping with your cramping.  We have referred you to pelvic floor therapy and they will contact you directly with an appointment date and time.   Please do Kegel exercises five times a day.   The [facility] providers would like to encourage you to use [facility] to communicate with providers for non-urgent requests or questions.  Due to long hold times on the telephone, sending your provider a message by [facility] may be a faster and more efficient way to get a response.  Please allow 48 business hours for a response.  Please remember that this is for non-urgent requests.   Thank you for choosing me and [facility].  Pricilla Riffle. Dagoberto Ligas., MD., [facility]   Kegel Exercises  Kegel exercises can help strengthen your pelvic floor muscles. The pelvic floor is a group of muscles that support your rectum, small intestine, and bladder. In females, pelvic floor muscles also help support the uterus. These muscles help you control the flow of urine and stool (feces). Kegel exercises are painless and simple. They do not require any equipment. Your provider may suggest Kegel exercises to: Improve bladder and bowel control. Improve sexual response. Improve weak pelvic floor muscles after surgery to remove the uterus (hysterectomy) or after pregnancy, in females. Improve weak pelvic floor muscles after prostate gland removal or surgery, in males. Kegel exercises involve squeezing your pelvic floor muscles. These are the same muscles you squeeze when you try to stop the flow of urine or keep from passing gas. The exercises can be done while sitting, standing, or lying down, but it is best to vary your position. Ask your  health care provider which exercises are safe for you. Do exercises exactly as told by your health care provider and adjust them as directed. Do not begin these exercises until told by your health care provider. Exercises How to do Kegel exercises: Squeeze your pelvic floor muscles tight. You should feel a tight lift in your rectal area. If you are a female, you should also feel a tightness in your vaginal area. Keep your stomach, buttocks, and legs relaxed. Hold the muscles tight for up to 10 seconds. Breathe normally. Relax your muscles for up to 10 seconds. Repeat as told by your health care provider. Repeat this exercise daily as told by your health care provider. Continue to do this exercise for at least 4-6 weeks, or for as long as told by your health care provider. You may be referred to a physical therapist who can help you learn more about how to do Kegel exercises. Depending on your condition, your health care provider may recommend: Varying how long you squeeze your muscles. Doing several sets of exercises every day. Doing exercises for several weeks. Making Kegel exercises a part of your regular exercise routine. This information is not intended to replace advice given to you by your health care provider. Make sure you discuss any questions you have with your health care provider. Document Revised: 09/26/2020 Document Reviewed: 09/26/2020 Elsevier Patient Education  [employer].

## 2021-12-10 ENCOUNTER — Other Ambulatory Visit: Payer: Self-pay

## 2021-12-10 ENCOUNTER — Ambulatory Visit: Payer: Medicare Other | Attending: Gastroenterology | Admitting: Physical Therapy

## 2021-12-10 ENCOUNTER — Encounter: Payer: Self-pay | Admitting: Physical Therapy

## 2021-12-10 DIAGNOSIS — R197 Diarrhea, unspecified: Secondary | ICD-10-CM | POA: Diagnosis not present

## 2021-12-10 DIAGNOSIS — R109 Unspecified abdominal pain: Secondary | ICD-10-CM | POA: Insufficient documentation

## 2021-12-10 DIAGNOSIS — R159 Full incontinence of feces: Secondary | ICD-10-CM | POA: Insufficient documentation

## 2021-12-10 DIAGNOSIS — R2689 Other abnormalities of gait and mobility: Secondary | ICD-10-CM | POA: Diagnosis not present

## 2021-12-10 DIAGNOSIS — M6281 Muscle weakness (generalized): Secondary | ICD-10-CM | POA: Insufficient documentation

## 2021-12-12 ENCOUNTER — Ambulatory Visit: Payer: Medicare Other | Admitting: Physical Therapy

## 2021-12-12 ENCOUNTER — Encounter: Payer: Self-pay | Admitting: Physical Therapy

## 2021-12-12 DIAGNOSIS — M6281 Muscle weakness (generalized): Secondary | ICD-10-CM

## 2021-12-12 DIAGNOSIS — R2689 Other abnormalities of gait and mobility: Secondary | ICD-10-CM

## 2021-12-12 DIAGNOSIS — R159 Full incontinence of feces: Secondary | ICD-10-CM | POA: Diagnosis not present

## 2021-12-12 DIAGNOSIS — R197 Diarrhea, unspecified: Secondary | ICD-10-CM | POA: Diagnosis not present

## 2021-12-12 DIAGNOSIS — R109 Unspecified abdominal pain: Secondary | ICD-10-CM | POA: Diagnosis not present

## 2021-12-12 NOTE — Therapy (Addendum)
OUTPATIENT PHYSICAL THERAPY TREATMENT NOTE   Patient Name: [name] MRN: [id number] DOB:1945/11/04, 76 y.o., female Today's Date: 12/12/2021  PCP: Hoyt Koch, MD REFERRING PROVIDER: Ladene Artist, MD  END OF SESSION:   PT End of Session - 12/12/21 0930     Visit Number 2    Date for PT Re-Evaluation 03/04/22    Authorization Type medicare    Authorization - Visit Number 2    Authorization - Number of Visits 10    PT Start Time 0930    PT Stop Time 1010    PT Time Calculation (min) 40 min    Activity Tolerance Patient tolerated treatment well    Behavior During Therapy WFL for tasks assessed/performed             Past Medical History:  Diagnosis Date   Allergy    ANEMIA, IRON DEFICIENCY 05/08/2009   Angina    ASYMPTOMATIC POSTMENOPAUSAL STATUS 10/11/2008   Blood transfusion    Blood transfusion without reported diagnosis    Breast cancer ([place]) 09/29/2011   invasive grade III ductal ca,assoc high grade dcis,ER/PR=neg; left breast   C. difficile colitis    Cataract    Diverticulosis of colon (without mention of hemorrhage)    Esophageal reflux 06/12/2008   Gastroparesis    GOITER, MULTINODULAR 04/02/2009   Gout, unspecified    H/O hiatal hernia    History of kidney stones    History of lower GI bleeding    History of radiation therapy 02/08/12-03/25/12   left breast,total 61gy   Hypokalemia 05/11/2013   Hypomagnesemia    HYPOTHYROIDISM, POST-RADIATION 08/13/2009   Internal hemorrhoids without mention of complication    Kidney stones    "several"   Leukopenia    Migraines    Obesity    On supplemental oxygen therapy    at night while sleeping - patient did not want to undergo sleep study   Osteoarthrosis, unspecified whether generalized or localized, unspecified site    Other and unspecified hyperlipidemia    Personal history of chemotherapy 2013   Personal history of radiation therapy 2013   left   PONV (postoperative nausea and  vomiting)    PUD (peptic ulcer disease)    Short bowel syndrome    Shortness of breath on exertion    "sometimes"   Stricture and stenosis of esophagus    Thyrotoxicosis without mention of goiter or other cause, without mention of thyrotoxic crisis or storm    Type II or unspecified type diabetes mellitus without mention of complication, not stated as uncontrolled    no med in years diet controled   Unspecified essential hypertension    UTI (urinary tract infection)    Varicose veins    VITAMIN B12 DEFICIENCY 08/30/2009   Past Surgical History:  Procedure Laterality Date   ABDOMINAL ADHESION SURGERY  1980's thru 1990's   "several"   ABDOMINAL HYSTERECTOMY  1970's   with BSO   BREAST BIOPSY Left 08/13/11   left breast lower inner quadrant   BREAST BIOPSY Right 1985   Rt exc bx, benign   BREAST LUMPECTOMY Left 08/2011   BREAST LUMPECTOMY W/ NEEDLE LOCALIZATION  09/29/11   left  breast=lymph node,excision benign/ ER/PR=neg, her 2 Positive   BREAST REDUCTION SURGERY Bilateral 06/09/2021   Procedure: MAMMARY REDUCTION  (BREAST);  Surgeon: Cindra Presume, MD;  Location: [place];  Service: Plastics;  Laterality: Bilateral;  2 hours   BUNIONECTOMY  1970's   bilateral  CHOLECYSTECTOMY  1990's   COLON SURGERY     "several surgeries for short bowel syndrome"   COLONOSCOPY  2012   multiple    DILATION AND CURETTAGE OF UTERUS     ESOPHAGOGASTRODUODENOSCOPY  2011   multiple    EXCISIONAL HEMORRHOIDECTOMY  11/10/2016   EYE SURGERY     "long time ago"   FLEXIBLE SIGMOIDOSCOPY  2011   multiple    KIDNEY STONE SURGERY  1990's   "tried to go up & get it but pushed it further up"   LITHOTRIPSY     "4 or 5 times"   MASTECTOMY W/ NODES PARTIAL  09/29/11   left   PORT-A-CATH REMOVAL Right 12/19/2013   Procedure: MINOR REMOVAL PORT-A-CATH;  Surgeon: Adin Hector, MD;  Location: [place];  Service: General;  Laterality: Right;   PORTACATH PLACEMENT  09/29/2011   Procedure:  INSERTION PORT-A-CATH;  Surgeon: Adin Hector, MD;  Location: [place];  Service: General;  Laterality: N/A;   SMALL INTESTINE SURGERY     Thyroid Ultrasound  12/1994 and 12/1995   TOTAL KNEE ARTHROPLASTY Right 06/05/2015   Procedure: RIGHT TOTAL KNEE ARTHROPLASTY;  Surgeon: Ninetta Lights, MD;  Location: [place];  Service: Orthopedics;  Laterality: Right;   UPPER GASTROINTESTINAL ENDOSCOPY     VEIN LIGATION AND STRIPPING  1980's   Right leg   Patient Active Problem List   Diagnosis Date Noted   Elevated LFTs 09/25/2021   Abnormal vaginal bleeding 08/21/2021   Seasonal allergic rhinitis 08/07/2021   Asthmatic bronchitis 01/10/2021   Nocturnal hypoxemia 07/08/2020   Short gut syndrome 02/08/2019   DJD (degenerative joint disease) of knee 06/05/2015   Pain in joint, shoulder region 09/24/2014   Hemorrhoids without complication 48/62/8241   Pain in joint, lower leg 02/01/2014   Vitamin D deficiency 01/16/2014   Gastroparesis 09/14/2013   Nausea alone 09/04/2013   Iron deficiency anemia 08/28/2013   Hypomagnesemia 05/11/2013   Hypokalemia 05/11/2013   Breast cancer ([place]) 09/29/2011   Primary cancer of lower-inner quadrant of left female breast ([place]) 08/17/2011   Neck pain 07/29/2011   Routine general medical examination at a health care facility 06/28/2011   CLOSTRIDIUM DIFFICILE COLITIS 02/07/2010   Blind loop syndrome 10/01/2009   Vitamin B 12 deficiency 08/30/2009   Hypothyroidism following radioiodine therapy 08/13/2009   GOITER, MULTINODULAR 04/02/2009   BACK PAIN, CHRONIC 07/03/2008   Hyperlipidemia 12/10/2006   Gout 12/10/2006   HYPERTENSION 12/10/2006   OSTEOARTHRITIS 12/10/2006   ESOPHAGEAL STRICTURE 08/23/2002   HIATAL HERNIA 08/23/2002   REFERRING DIAG:  R19.7 (ICD-10-CM) - Diarrhea, unspecified type  R15.9 (ICD-10-CM) - Incontinence of feces, unspecified fecal incontinence type      THERAPY DIAG:  Muscle weakness (generalized)   Other abnormalities of gait and  mobility   Abdominal pain, unspecified abdominal location   Rationale for Evaluation and Treatment Rehabilitation   ONSET DATE: 1.5 years ago   SUBJECTIVE:  SUBJECTIVE STATEMENT: Patient had bowel obstructions and has been operated 3 times. Her fecal leakage started after the last surgery. I have adhesions in the stomach from the surgerys.  Fluid intake: Yes: mostly water.        PAIN:  Are you having pain? Yes NPRS scale: 2/10 Pain location:  abdominal   Pain type: sharp Pain description: intermittent    Aggravating factors: depends on the food Relieving factors: medicateion   PAIN:  Are you having pain? Yes NPRS scale: 9/10 Pain location: pelvic floor Pain orientation: Lower  PAIN TYPE: shooting Pain description: intermittent  Aggravating factors: random pain Relieving factors: not sure     PRECAUTIONS: Other: breast cancer   WEIGHT BEARING RESTRICTIONS No   FALLS:  Has patient fallen in last 6 months? No   LIVING ENVIRONMENT: Lives with: lives with their family   OCCUPATION: retired   PLOF: [place] go out without fecal leakage   PERTINENT HISTORY:  Gastroparesis; right total knee replacement, Breast cancer with radiation, Mastectomy 09/29/11; Abdominal adhesion surgery; Abdominal Hysterectomy; Stage 3 kidney failure Sexual abuse: No   BOWEL MOVEMENT Pain with bowel movement: Yes  sometimes stomach pain Type of bowel movement:Type (Bristol Stool Scale) Type 4 and 6, Frequency daily, and Strain No Fully empty rectum: Yes:   Leakage: Yes: walking, random Pads: Yes: depends 3-4 Fiber supplement: No   URINATION Pain with urination: No Fully empty bladder: Yes:   Stream: Strong Urgency: Yes:   Frequency: average Leakage:  no        PREGNANCY Vaginal deliveries 3 Tearing No   Currently pregnant No         OBJECTIVE:    COGNITION:            Overall cognitive status: Within functional limits for tasks assessed                          SENSATION:            Light touch: Appears intact            Proprioception: Appears intact   MUSCLE LENGTH: Hamstrings: Right decreased  Left decreased [name] test: Right positive  Left positive    L   FUNCTIONAL TESTS:  5 times sit to stand: 18 sec Timed up and go (TUG): 16 sec   GAIT: Distance walked: 10 feet Assistive device utilized: None Level of assistance: Complete Independence Comments: Patient walks with a flexed posture , decreased step length,                 POSTURE: rounded shoulders, forward head, decreased lumbar lordosis, and flexed trunk                PELVIC ALIGNMENT:   LUMBARAROM/PROM   A/PROM A/PROM  eval  Flexion    Extension Decreased by 75%  Right lateral flexion Decreased by 25%  Left lateral flexion Decreased by 25%  Right rotation Decreased by 25%  Left rotation Decreased by 25%   (Blank rows = not tested)   LOWER EXTREMITY ROM:   Passive ROM Right eval Left eval  Hip flexion 105 100  Hip extension 5 0  Hip abduction 10 10  Hip internal rotation 25 10  Hip external rotation 30 45   (Blank rows = not tested)   LOWER EXTREMITY MMT:   MMT Right eval Left eval  Hip flexion 4/5 4/5  Hip extension 2/5 2/5  Hip abduction 2/5 2/5  Hip adduction 4/5 4/5  Hip internal rotation 4/5 4/5  Hip external rotation 4/5 4/5     PALPATION:   General  decreased mobility of abdominal scars, skin lays over the scars, tenderness throughout the abdomen, decreased lower rib cage mobility                 External Perineal Exam skin seems thin around rectum                             Internal Pelvic Floor minimal movement of the puborectalis, tenderness    Patient confirms identification and approves PT to assess internal pelvic  floor and treatment Yes   PELVIC MMT:   MMT eval  Internal Anal Sphincter 2/5  External Anal Sphincter 2/5  Puborectalis 2/5  (Blank rows = not tested)         TONE: increased       TODAY'S TREATMENT  12/12/2021 Manual: Scar tissue mobilization:to scars on abdomen and educated patient on how to perform on her own and had her demonstrate; mobilized around the umbilicus Myofascial release: to the abdomen to release the fascial restrictions, release around the intestines Exercises: Stretches/mobility:sitting hamstring stretch holding for 30 sec each leg   Piriformis stretch holding for 30 sec in supine each leg   Trunk rotation twist in supine holding 30 sec each side Strengthening: Nustep with just legs level 5 for 5 min. While assessing the patient.             PATIENT EDUCATION: Education details: Access Code: [email or web address] Person educated: Patient Education method: Explanation, Demonstration, Tactile cues, Verbal cues, and Handouts Education comprehension: verbalized understanding, returned demonstration, verbal cues required, tactile cues required, and needs further education       HOME EXERCISE PROGRAM: 12/12/2021 Access Code: [email or web address] URL: [email or web address] Date: 12/12/2021 Prepared by: Earlie Counts  Exercises - Seated Hamstring Stretch  - 1 x daily - 7 x weekly - 1 sets - 2 reps - 30 sec hold - Supine Figure 4 Piriformis Stretch  - 1 x daily - 7 x weekly - 1 sets - 2 reps - 30 sec hold - Supine Piriformis Stretch with Leg Straight  - 1 x daily - 7 x weekly - 1 sets - 2 reps - 30 sec hold   ASSESSMENT:   CLINICAL IMPRESSION: Patient is a 76 y.o. female who was seen today for physical therapy treatment for fecal incontinence. Patient understands how to massage her scars. She has some difficulty moving on the mat and stretching due to lack of mobility. Patient abdomen was smaller after the manual work.  Patient will benefit from skilled therapy to  improve mobility, strength and reduce her fecal leakage.       OBJECTIVE IMPAIRMENTS decreased activity tolerance, decreased balance, decreased coordination, decreased endurance, decreased mobility, difficulty walking, decreased ROM, decreased strength, increased fascial restrictions, impaired flexibility, and pain.    ACTIVITY LIMITATIONS continence, toileting, and locomotion level   PARTICIPATION LIMITATIONS: community activity   PERSONAL FACTORS Breast cancer with radiation, Mastectomy 09/29/11; Abdominal adhesion surgery; Abdominal Hysterectomy; Stage 3 kidney failure are also affecting patient's functional outcome.    REHAB POTENTIAL: Excellent   CLINICAL DECISION MAKING: Evolving/moderate complexity   EVALUATION COMPLEXITY: Moderate     GOALS: Goals reviewed with patient? Yes   SHORT TERM GOALS: Target date: 01/07/2022   Patient independent with hip stretches and pelvic floor contraction.  Baseline:  Goal status: INITIAL   2.  Patient is able to hug the therapist finger anally and pelvic floor able to lift to reduce fecal leakage.  Baseline:  Goal status: INITIAL   3.  Patient independent with scar massage to reduce abdominal pain.  Baseline:  Goal status: met 12/12/2021     LONG TERM GOALS: Target date: 03/04/2022    Patient independent with advanced HEP for core, pelvic floor and hip strength to reduce fecal leakage.  Baseline:  Goal status: INITIAL   2.  Timed up and go test >/= 13 seconds so she is able to get to the bathroom in time.  Baseline:  Goal status: INITIAL   3.  Sit to stand </= 13 sec so she is able to get up from a chair to walk to the bathroom in time.  Baseline:  Goal status: INITIAL   4.  Pelvic floor strength >/= 3/5 so her fecal leakage </= 80% and she does not have to wear depends just a thin pad.  Baseline:  Goal status: INITIAL   5.  Abdominal pain decreased >/= 2/10 due to improved tissue and scar mobility.  Baseline:  Goal status:  INITIAL   6.  Pelvic pain decreased </= 2/10 due to improved tissue mobility and increased strength Baseline:  Goal status: INITIAL   PLAN: PT FREQUENCY: 1-2x/week   PT DURATION: 12 weeks   PLANNED INTERVENTIONS: Therapeutic exercises, Therapeutic activity, Neuromuscular re-education, Balance training, Gait training, Patient/Family education, Joint mobilization, Dry Needling, Cryotherapy, Moist heat, scar mobilization, Biofeedback, and Manual therapy   PLAN FOR NEXT SESSION: scar massage, work on anal tissue work,  abdominal contraction, hip flexor stretch   Earlie Counts, PT 12/12/21 10:14 AM   PHYSICAL THERAPY DISCHARGE SUMMARY  Visits from Start of Care: 2  Current functional level related to goals / functional outcomes: See above. Patient called today to inform the therapist she wants to be discharged and do the exercises at home.    Remaining deficits: See above.    Education / Equipment: HEP   Patient agrees to discharge. Patient goals were not met. Patient is being discharged due to the patient's request. Thank you for the referral. Earlie Counts, PT 01/05/22 10:34 AM

## 2021-12-29 ENCOUNTER — Telehealth: Payer: Self-pay | Admitting: Gastroenterology

## 2021-12-29 MED ORDER — PANTOPRAZOLE SODIUM 40 MG PO TBEC: DELAYED_RELEASE_TABLET | ORAL | 1 refills | Status: DC

## 2021-12-29 NOTE — Telephone Encounter (Signed)
Prescription sent to patient's pharmacy. Attempted to contact patient and line is busy.

## 2021-12-29 NOTE — Telephone Encounter (Signed)
Inbound call from patient stating that she has never received her prescription for Protonix. Patient is requesting it be resent to  [facility]. Please advise.

## 2022-01-05 ENCOUNTER — Ambulatory Visit: Payer: Medicare Other | Admitting: Physical Therapy

## 2022-01-07 ENCOUNTER — Ambulatory Visit: Payer: Medicare Other | Admitting: Physical Therapy

## 2022-01-09 ENCOUNTER — Ambulatory Visit (INDEPENDENT_AMBULATORY_CARE_PROVIDER_SITE_OTHER): Payer: Medicare Other

## 2022-01-09 DIAGNOSIS — Z0000 Encounter for general adult medical examination without abnormal findings: Secondary | ICD-10-CM

## 2022-01-09 NOTE — Progress Notes (Signed)
I connected with Ewell Poe today by telephone and verified that I am speaking with the correct person using two identifiers. Location patient: home Location provider: work Persons participating in the virtual visit: patient, provider.   I discussed the limitations, risks, security and privacy concerns of performing an evaluation and management service by telephone and the availability of in person appointments. I also discussed with the patient that there may be a patient responsible charge related to this service. The patient expressed understanding and verbally consented to this telephonic visit.    Interactive audio and video telecommunications were attempted between this provider and patient, however failed, due to patient having technical difficulties OR patient did not have access to video capability.  We continued and completed visit with audio only.  Some vital signs may be absent or patient reported.   Time Spent with patient on telephone encounter: 30 minutes  Subjective:   [name] is a 76 y.o. female who presents for Medicare Annual (Subsequent) preventive examination.  Review of Systems     Cardiac Risk Factors include: advanced age (>65mn, >>62women);dyslipidemia;family history of premature cardiovascular disease     Objective:    There were no vitals filed for this visit. There is no height or weight on file to calculate BMI.     01/09/2022   10:34 AM 12/10/2021   11:47 AM 05/29/2021   11:38 AM 01/06/2021    3:38 PM 06/28/2019    9:04 AM 02/08/2019   10:00 PM 02/08/2019    1:08 PM  Advanced Directives  Does Patient Have a Medical Advance Directive? Yes Yes Yes Yes Yes Yes Yes  Type of Advance Directive Living will;Healthcare Power of [place] will [place] will Living will [place]ount Carm[place]Living will Living will Living will  Does patient want to make changes to medical advance  directive?  No - Patient declined No - Patient declined No - Patient declined  No - Patient declined   Copy of [place] Chart? No - copy requested No - copy requested Yes - validated most recent copy scanned in chart (See row information)  No - copy requested    Would patient like information on creating a medical advance directive?     No - Patient declined      Current Medications (verified) Outpatient Encounter Medications as of 01/09/2022  Medication Sig   albuterol (VENTOLIN HFA) 108 (90 Base) MCG/ACT inhaler Inhale 2 puffs into the lungs every 6 (six) hours as needed for wheezing or shortness of breath.   allopurinol (ZYLOPRIM) 100 MG tablet Take 2 tablets (200 mg total) by mouth daily.   amitriptyline (ELAVIL) 50 MG tablet TAKE ONE TABLET BY MOUTH EVERY DAY AT BEDTIME (USE CAUTION - MAY CAUSE DROWSINESS)   calcium carbonate (TUMS - DOSED IN MG ELEMENTAL CALCIUM) 500 MG chewable tablet Chew 1 tablet by mouth daily as needed for indigestion or heartburn.   Calcium Carbonate-Vitamin D (CALCIUM-VITAMIN D) 600-200 MG-UNIT CAPS Take 1 capsule by mouth daily.   cholecalciferol (VITAMIN D) 25 MCG (1000 UNIT) tablet Take 1,000 Units by mouth daily.   colchicine 0.6 MG tablet Take 1 tablet (0.6 mg total) by mouth daily as needed (gout or psuedogout pain).   Cyanocobalamin (VITAMIN B 12 PO) Take 1,000 mcg by mouth daily.   dicyclomine (BENTYL) 20 MG tablet Take 1 tablet (20 mg total) by mouth 3 (three) times daily before meals.   ferrous sulfate  325 (65 FE) MG tablet Take 1 tablet (325 mg total) by mouth daily with breakfast.   fluticasone (FLONASE) 50 MCG/ACT nasal spray Place 2 sprays into both nostrils daily.   HYDROcodone-acetaminophen (NORCO/VICODIN) 5-325 MG tablet Take 1 tablet by mouth every 6 (six) hours as needed. (Patient not taking: Reported on 09/29/2021)   levothyroxine (SYNTHROID) 175 MCG tablet TAKE ONE TABLET BY MOUTH EVERY DAY BEFORE BREAKFAST.   loratadine  (CLARITIN) 10 MG tablet Take 1 tablet (10 mg total) by mouth daily. (Patient not taking: Reported on 12/09/2021)   lovastatin (MEVACOR) 20 MG tablet TAKE ONE TABLET BY MOUTH EVERY DAY AT 6:00PM   magnesium oxide (MAG-OX) 400 MG tablet Take 1 tablet (400 mg total) by mouth 2 (two) times daily.   meloxicam (MOBIC) 15 MG tablet Take 1 tablet (15 mg total) by mouth daily.   nitroGLYCERIN (NITROSTAT) 0.4 MG SL tablet Place 1 tablet (0.4 mg total) under the tongue every 5 (five) minutes as needed.   ondansetron (ZOFRAN) 4 MG tablet Take 1 tablet (4 mg total) by mouth every 8 (eight) hours as needed for nausea or vomiting.   pantoprazole (PROTONIX) 40 MG tablet TAKE 1 TABLET BY MOUTH 30 MINUTES PRIOR TO BREAKFAST AND SUPPER   potassium chloride SA (KLOR-CON M) 20 MEQ tablet Take 1 tablet (20 mEq total) by mouth daily.   No facility-administered encounter medications on file as of 01/09/2022.    Allergies (verified) Aspirin, Trazodone and nefazodone, Flagyl [metronidazole], and Morphine and related   History: Past Medical History:  Diagnosis Date   Allergy    ANEMIA, IRON DEFICIENCY 05/08/2009   Angina    ASYMPTOMATIC POSTMENOPAUSAL STATUS 10/11/2008   Blood transfusion    Blood transfusion without reported diagnosis    Breast cancer ([place]) 09/29/2011   invasive grade III ductal ca,assoc high grade dcis,ER/PR=neg; left breast   C. difficile colitis    Cataract    Diverticulosis of colon (without mention of hemorrhage)    Esophageal reflux 06/12/2008   Gastroparesis    GOITER, MULTINODULAR 04/02/2009   Gout, unspecified    H/O hiatal hernia    History of kidney stones    History of lower GI bleeding    History of radiation therapy 02/08/12-03/25/12   left breast,total 61gy   Hypokalemia 05/11/2013   Hypomagnesemia    HYPOTHYROIDISM, POST-RADIATION 08/13/2009   Internal hemorrhoids without mention of complication    Kidney stones    "several"   Leukopenia    Migraines    Obesity    On  supplemental oxygen therapy    at night while sleeping - patient did not want to undergo sleep study   Osteoarthrosis, unspecified whether generalized or localized, unspecified site    Other and unspecified hyperlipidemia    Personal history of chemotherapy 2013   Personal history of radiation therapy 2013   left   PONV (postoperative nausea and vomiting)    PUD (peptic ulcer disease)    Short bowel syndrome    Shortness of breath on exertion    "sometimes"   Stricture and stenosis of esophagus    Thyrotoxicosis without mention of goiter or other cause, without mention of thyrotoxic crisis or storm    Type II or unspecified type diabetes mellitus without mention of complication, not stated as uncontrolled    no med in years diet controled   Unspecified essential hypertension    UTI (urinary tract infection)    Varicose veins    VITAMIN B12 DEFICIENCY 08/30/2009  Past Surgical History:  Procedure Laterality Date   ABDOMINAL ADHESION SURGERY  1980's thru 1990's   "several"   ABDOMINAL HYSTERECTOMY  1970's   with BSO   BREAST BIOPSY Left 08/13/11   left breast lower inner quadrant   BREAST BIOPSY Right 1985   Rt exc bx, benign   BREAST LUMPECTOMY Left 08/2011   BREAST LUMPECTOMY W/ NEEDLE LOCALIZATION  09/29/11   left  breast=lymph node,excision benign/ ER/PR=neg, her 2 Positive   BREAST REDUCTION SURGERY Bilateral 06/09/2021   Procedure: MAMMARY REDUCTION  (BREAST);  Surgeon: Cindra Presume, MD;  Location: [place];  Service: Plastics;  Laterality: Bilateral;  2 hours   BUNIONECTOMY  1970's   bilateral   CHOLECYSTECTOMY  1990's   COLON SURGERY     "several surgeries for short bowel syndrome"   COLONOSCOPY  2012   multiple    DILATION AND CURETTAGE OF UTERUS     ESOPHAGOGASTRODUODENOSCOPY  2011   multiple    EXCISIONAL HEMORRHOIDECTOMY  11/10/2016   EYE SURGERY     "long time ago"   FLEXIBLE SIGMOIDOSCOPY  2011   multiple    KIDNEY STONE SURGERY  1990's   "tried to go up &  get it but pushed it further up"   LITHOTRIPSY     "4 or 5 times"   MASTECTOMY W/ NODES PARTIAL  09/29/11   left   PORT-A-CATH REMOVAL Right 12/19/2013   Procedure: MINOR REMOVAL PORT-A-CATH;  Surgeon: Adin Hector, MD;  Location: [place];  Service: General;  Laterality: Right;   PORTACATH PLACEMENT  09/29/2011   Procedure: INSERTION PORT-A-CATH;  Surgeon: Adin Hector, MD;  Location: [place];  Service: General;  Laterality: N/A;   SMALL INTESTINE SURGERY     Thyroid Ultrasound  12/1994 and 12/1995   TOTAL KNEE ARTHROPLASTY Right 06/05/2015   Procedure: RIGHT TOTAL KNEE ARTHROPLASTY;  Surgeon: Ninetta Lights, MD;  Location: [place];  Service: Orthopedics;  Laterality: Right;   UPPER GASTROINTESTINAL ENDOSCOPY     VEIN LIGATION AND STRIPPING  1980's   Right leg   Family History  Problem Relation Age of Onset   Esophageal cancer Son        deceased   Breast cancer Son        esophageal   Diabetes Mother    Heart disease Mother    Kidney disease Sister    Diabetes Father    Hypertension Father    Kidney disease Brother        x 3   Colon cancer Paternal Uncle    Breast cancer Maternal Aunt    Breast cancer Paternal Aunt    Breast cancer Cousin        Pt states she has 15+ cousins w/ Breast CA   Rectal cancer Neg Hx    Stomach cancer Neg Hx    Social History   Socioeconomic History   Marital status: Widowed    Spouse name: Not on file   Number of children: 2   Years of education: 10   Highest education level: 10th grade  Occupational History   Occupation: RETIRED  Tobacco Use   Smoking status: Former    Packs/day: 1.00    Years: 10.00    Total pack years: 10.00    Types: Cigarettes    Quit date: 09/22/1985    Years since quitting: 36.3   Smokeless tobacco: Never  Vaping Use   Vaping Use: Never used  Substance and Sexual  Activity   Alcohol use: No   Drug use: No   Sexual activity: Not Currently    Birth control/protection: Surgical    Comment:  1st intercourse- 15, partners- 5  Other Topics Concern   Not on file  Social History Narrative   Not on file   Social Determinants of Health   Financial Resource Strain: High Risk (01/09/2022)   Overall Financial Resource Strain (CARDIA)    Difficulty of Paying Living Expenses: Very hard  Food Insecurity: No Food Insecurity (01/09/2022)   Hunger Vital Sign    Worried About Running Out of Food in the Last Year: Never true    Ran Out of Food in the Last Year: Never true  Transportation Needs: No Transportation Needs (01/09/2022)   PRAPARE - Hydrologist (Medical): No    Lack of Transportation (Non-Medical): No  Physical Activity: Inactive (01/09/2022)   Exercise Vital Sign    Days of Exercise per Week: 0 days    Minutes of Exercise per Session: 0 min  Stress: No Stress Concern Present (01/09/2022)   [place]    Feeling of Stress : Not at all  Social Connections: Moderately Integrated (01/09/2022)   Social Connection and Isolation Panel [NHANES]    Frequency of Communication with Friends and Family: More than three times a week    Frequency of Social Gatherings with Friends and Family: Not on file    Attends Religious Services: More than 4 times per year    Active Member of [employer] or Organizations: Yes    Attends Archivist Meetings: More than 4 times per year    Marital Status: Widowed    Tobacco Counseling Counseling given: Not Answered   Clinical Intake:  Pre-visit preparation completed: Yes  Pain : No/denies pain     Nutritional Risks: Nausea/ vomitting/ diarrhea Diabetes: No  How often do you need to have someone help you when you read instructions, pamphlets, or other written materials from your doctor or pharmacy?: 1 - Never What is the last grade level you completed in school?: 10th grade  Diabetic? no  Interpreter Needed?: No  Information entered by ::  Lisette Abu, LPN.   Activities of Daily Living    01/09/2022   10:25 AM 05/29/2021   11:42 AM  In your present state of health, do you have any difficulty performing the following activities:  Hearing? 0   Vision? 0   Difficulty concentrating or making decisions? 0   Walking or climbing stairs? 1   Dressing or bathing? 0   Doing errands, shopping? 1 0  Preparing Food and eating ? N   Using the Toilet? N   In the past six months, have you accidently leaked urine? Y   Do you have problems with loss of bowel control? Y   Managing your Medications? N   Managing your Finances? N   Housekeeping or managing your Housekeeping? N     Patient Care Team: Hoyt Koch, MD as PCP - General (Internal Medicine) Elouise Munroe, MD as PCP - Cardiology (Cardiology) Sable Feil, MD as Attending Physician (Gastroenterology) Marcy Panning, MD (Hematology and Oncology) Fanny Skates, MD as Attending Physician (General Surgery) Jesusita Oka, RN as Registered Nurse Amada Kingfisher, MD as Consulting Physician (Hematology and Oncology) Sharyne Peach, MD as Consulting Physician (Ophthalmology) Elmarie Shiley, MD as Consulting Physician (Nephrology) Warden Fillers, MD as Consulting Physician (Ophthalmology) Ladene Artist,  MD as Consulting Physician (Gastroenterology)  Indicate any recent Medical Services you may have received from other than [facility] providers in the past year (date may be approximate).     Assessment:   This is a routine wellness examination for Santos.  Hearing/Vision screen Hearing Screening - Comments:: Patient denied any hearing difficulty.   No hearing aids.  Vision Screening - Comments:: Patient does wear corrective lenses/contacts.  Eye exam done by: [facility]   Dietary issues and exercise activities discussed: Current Exercise Habits: The patient does not participate in regular exercise at present   Goals Addressed              This Visit's Progress    Patient declined health goal at this time.        Depression Screen    01/09/2022   10:25 AM 09/25/2021    9:45 AM 08/21/2021    9:42 AM 08/14/2021    1:02 PM 08/07/2021    1:04 PM 01/06/2021    4:09 PM 12/13/2020    9:45 AM  PHQ 2/9 Scores  PHQ - 2 Score 0 0 0 0 0 0 0  PHQ- 9 Score  0         Fall Risk    01/09/2022   10:22 AM 09/25/2021    9:45 AM 08/21/2021    9:42 AM 08/14/2021    1:02 PM 08/07/2021    1:04 PM  Fall Risk   Falls in the past year? 1 0 0 0 0  Number falls in past yr: 1 0   0  Injury with Fall? 1 0   0  Risk for fall due to : Impaired balance/gait;History of fall(s);Medication side effect      Follow up Falls evaluation completed        FALL RISK PREVENTION PERTAINING TO THE HOME:  Any stairs in or around the home? No  If so, are there any without handrails? No  Home free of loose throw rugs in walkways, pet beds, electrical cords, etc? Yes  Adequate lighting in your home to reduce risk of falls? Yes   ASSISTIVE DEVICES UTILIZED TO PREVENT FALLS:  Life alert? No  Use of a cane, walker or w/c? Yes  Grab bars in the bathroom? No  Shower chair or bench in shower? Yes  Elevated toilet seat or a handicapped toilet? Yes   TIMED UP AND GO:  Was the test performed? No .  Length of time to ambulate 10 feet: n/a sec.   Appearance of gait: Gait not evaluated during this visit.  Cognitive Function:        01/09/2022   10:30 AM  6CIT Screen  What Year? 0 points  What month? 0 points  What time? 0 points  Count back from 20 0 points  Months in reverse 0 points  Repeat phrase 0 points  Total Score 0 points    Immunizations Immunization History  Administered Date(s) Administered   Fluad Quad(high Dose 65+) 02/07/2019, 02/12/2020, 02/13/2021   Influenza Split 04/15/2011, 03/23/2012   Influenza Whole 05/15/2010   Influenza, High Dose Seasonal PF 03/31/2016, 03/15/2018   Influenza,inj,Quad PF,6+ Mos 02/23/2013, 02/01/2014,  03/29/2015, 06/28/2017   Influenza-Unspecified 02/13/2020   PFIZER(Purple Top)SARS-COV-2 Vaccination 06/23/2019, 07/14/2019, 03/03/2020   Pfizer Covid-19 Vaccine Bivalent Booster 23yr & up 04/01/2021   Pneumococcal Conjugate-13 05/01/2016   Pneumococcal Polysaccharide-23 05/15/2010   Td 01/31/2003   Tdap 06/28/2017   Zoster, Live 06/02/2011      Flu Vaccine  status: Up to date  Pneumococcal vaccine status: Up to date  Covid-19 vaccine status: Completed vaccines  Qualifies for Shingles Vaccine? Yes   Zostavax completed Yes   Shingrix Completed?: No.    Education has been provided regarding the importance of this vaccine. Patient has been advised to call insurance company to determine out of pocket expense if they have not yet received this vaccine. Advised may also receive vaccine at local pharmacy or Health Dept. Verbalized acceptance and understanding.  Screening Tests Health Maintenance  Topic Date Due   Zoster Vaccines- Shingrix (1 of 2) Never done   COVID-19 Vaccine (5 - Pfizer risk series) 05/27/2021   Diabetic kidney evaluation - Urine ACR  12/13/2021   INFLUENZA VACCINE  12/30/2021   Diabetic kidney evaluation - GFR measurement  09/26/2022   TETANUS/TDAP  06/29/2027   Pneumonia Vaccine 32+ Years old  Completed   DEXA SCAN  Completed   Hepatitis C Screening  Completed   HPV VACCINES  Aged Out   COLONOSCOPY (Pts 45-2yr Insurance coverage will need to be confirmed)  Discontinued    Health Maintenance  Health Maintenance Due  Topic Date Due   Zoster Vaccines- Shingrix (1 of 2) Never done   COVID-19 Vaccine (5 - Pfizer risk series) 05/27/2021   Diabetic kidney evaluation - Urine ACR  12/13/2021   INFLUENZA VACCINE  12/30/2021    Colorectal cancer screening: No longer required.   Mammogram status: Completed 11/24/2021. Repeat every year  Bone Density status: Completed 05/28/2020. Results reflect: Bone density results: NORMAL. Repeat every 5 years.  Lung Cancer  Screening: (Low Dose CT Chest recommended if Age 78-80years, 30 pack-year currently smoking OR have quit w/in 15years.) does not qualify.   Lung Cancer Screening Referral: no  Additional Screening:  Hepatitis C Screening: does qualify; Completed 08/03/2013  Vision Screening: Recommended annual ophthalmology exams for early detection of glaucoma and other disorders of the eye. Is the patient up to date with their annual eye exam?  Yes  Who is the provider or what is the name of the [facility] which the patient [facility] If pt is not established with a provider, would they like to be referred to a provider to establish care? No .   Dental Screening: Recommended annual dental exams for proper oral hygiene  Community Resource Referral / Chronic Care Management: CRR required this visit?  Yes   CCM required this visit?  No      Plan:     I have personally reviewed and noted the following in the patient's chart:   Medical and social history Use of alcohol, tobacco or illicit drugs  Current medications and supplements including opioid prescriptions.  Functional ability and status Nutritional status Physical activity Advanced directives List of other physicians Hospitalizations, surgeries, and ER visits in previous 12 months Vitals Screenings to include cognitive, depression, and falls Referrals and appointments  In addition, I have reviewed and discussed with patient certain preventive protocols, quality metrics, and best practice recommendations. A written personalized care plan for preventive services as well as general preventive health recommendations were provided to patient.     SSheral Flow LPN   [id number]  Nurse Notes:  There were no vitals filed for this visit. There is no height or weight on file to calculate BMI. Medications reviewed with patient; yes opioid use noted.

## 2022-01-09 NOTE — Patient Instructions (Signed)
[name] , Thank you for taking time to come for your Medicare Wellness Visit. I appreciate your ongoing commitment to your health goals. Please review the following plan we discussed and let me know if I can assist you in the future.   Screening recommendations/referrals: Colonoscopy: No longer recommended due to age. Mammogram: 11/24/2021; due every year Bone Density: 05/28/2020, due every 5 years Recommended yearly ophthalmology/optometry visit for glaucoma screening and checkup Recommended yearly dental visit for hygiene and checkup  Vaccinations: Influenza vaccine: 02/13/2021 Pneumococcal vaccine: 05/15/2010, 05/01/2016 Tdap vaccine: 06/28/2017; due every 10 years Shingles vaccine: never done   Covid-19:06/23/2019, 07/14/2019, 03/03/2020  Advanced directives: Yes; Please bring a copy of your health care power of attorney and living will to the office at your convenience.  Conditions/risks identified: Yes  Next appointment: Please schedule your next Medicare Wellness Visit with your Nurse Health Advisor in 1 year by calling [phone].   Preventive Care 70 Years and Older, Female Preventive care refers to lifestyle choices and visits with your health care provider that can promote health and wellness. What does preventive care include? A yearly physical exam. This is also called an annual well check. Dental exams once or twice a year. Routine eye exams. Ask your health care provider how often you should have your eyes checked. Personal lifestyle choices, including: Daily care of your teeth and gums. Regular physical activity. Eating a healthy diet. Avoiding tobacco and drug use. Limiting alcohol use. Practicing safe sex. Taking low-dose aspirin every day. Taking vitamin and mineral supplements as recommended by your health care provider. What happens during an annual well check? The services and screenings done by your health care provider during your annual well check will  depend on your age, overall health, lifestyle risk factors, and family history of disease. Counseling  Your health care provider may ask you questions about your: Alcohol use. Tobacco use. Drug use. Emotional well-being. Home and relationship well-being. Sexual activity. Eating habits. History of falls. Memory and ability to understand (cognition). Work and work Statistician. Reproductive health. Screening  You may have the following tests or measurements: Height, weight, and BMI. Blood pressure. Lipid and cholesterol levels. These may be checked every 5 years, or more frequently if you are over 50 years old. Skin check. Lung cancer screening. You may have this screening every year starting at age 30 if you have a 30-pack-year history of smoking and currently smoke or have quit within the past 15 years. Fecal occult blood test (FOBT) of the stool. You may have this test every year starting at age 21. Flexible sigmoidoscopy or colonoscopy. You may have a sigmoidoscopy every 5 years or a colonoscopy every 10 years starting at age 56. Hepatitis C blood test. Hepatitis B blood test. Sexually transmitted disease (STD) testing. Diabetes screening. This is done by checking your blood sugar (glucose) after you have not eaten for a while (fasting). You may have this done every 1-3 years. Bone density scan. This is done to screen for osteoporosis. You may have this done starting at age 68. Mammogram. This may be done every 1-2 years. Talk to your health care provider about how often you should have regular mammograms. Talk with your health care provider about your test results, treatment options, and if necessary, the need for more tests. Vaccines  Your health care provider may recommend certain vaccines, such as: Influenza vaccine. This is recommended every year. Tetanus, diphtheria, and acellular pertussis (Tdap, Td) vaccine. You may need a Td booster  every 10 years. Zoster vaccine. You may  need this after age 19. Pneumococcal 13-valent conjugate (PCV13) vaccine. One dose is recommended after age 76. Pneumococcal polysaccharide (PPSV23) vaccine. One dose is recommended after age 66. Talk to your health care provider about which screenings and vaccines you need and how often you need them. This information is not intended to replace advice given to you by your health care provider. Make sure you discuss any questions you have with your health care provider. Document Released: 06/14/2015 Document Revised: 02/05/2016 Document Reviewed: 03/19/2015 Elsevier Interactive Patient Education  2017 [place] Prevention in the Home Falls can cause injuries. They can happen to people of all ages. There are many things you can do to make your home safe and to help prevent falls. What can I do on the outside of my home? Regularly fix the edges of walkways and driveways and fix any cracks. Remove anything that might make you trip as you walk through a door, such as a raised step or threshold. Trim any bushes or trees on the path to your home. Use bright outdoor lighting. Clear any walking paths of anything that might make someone trip, such as rocks or tools. Regularly check to see if handrails are loose or broken. Make sure that both sides of any steps have handrails. Any raised decks and porches should have guardrails on the edges. Have any leaves, snow, or ice cleared regularly. Use sand or salt on walking paths during winter. Clean up any spills in your garage right away. This includes oil or grease spills. What can I do in the bathroom? Use night lights. Install grab bars by the toilet and in the tub and shower. Do not use towel bars as grab bars. Use non-skid mats or decals in the tub or shower. If you need to sit down in the shower, use a plastic, non-slip stool. Keep the floor dry. Clean up any water that spills on the floor as soon as it happens. Remove soap buildup in  the tub or shower regularly. Attach bath mats securely with double-sided non-slip rug tape. Do not have throw rugs and other things on the floor that can make you trip. What can I do in the bedroom? Use night lights. Make sure that you have a light by your bed that is easy to reach. Do not use any sheets or blankets that are too big for your bed. They should not hang down onto the floor. Have a firm chair that has side arms. You can use this for support while you get dressed. Do not have throw rugs and other things on the floor that can make you trip. What can I do in the kitchen? Clean up any spills right away. Avoid walking on wet floors. Keep items that you use a lot in easy-to-reach places. If you need to reach something above you, use a strong step stool that has a grab bar. Keep electrical cords out of the way. Do not use floor polish or wax that makes floors slippery. If you must use wax, use non-skid floor wax. Do not have throw rugs and other things on the floor that can make you trip. What can I do with my stairs? Do not leave any items on the stairs. Make sure that there are handrails on both sides of the stairs and use them. Fix handrails that are broken or loose. Make sure that handrails are as long as the stairways. Check any carpeting to  make sure that it is firmly attached to the stairs. Fix any carpet that is loose or worn. Avoid having throw rugs at the top or bottom of the stairs. If you do have throw rugs, attach them to the floor with carpet tape. Make sure that you have a light switch at the top of the stairs and the bottom of the stairs. If you do not have them, ask someone to add them for you. What else can I do to help prevent falls? Wear shoes that: Do not have high heels. Have rubber bottoms. Are comfortable and fit you well. Are closed at the toe. Do not wear sandals. If you use a stepladder: Make sure that it is fully opened. Do not climb a closed  stepladder. Make sure that both sides of the stepladder are locked into place. Ask someone to hold it for you, if possible. Clearly mark and make sure that you can see: Any grab bars or handrails. First and last steps. Where the edge of each step is. Use tools that help you move around (mobility aids) if they are needed. These include: Canes. Walkers. Scooters. Crutches. Turn on the lights when you go into a dark area. Replace any light bulbs as soon as they burn out. Set up your furniture so you have a clear path. Avoid moving your furniture around. If any of your floors are uneven, fix them. If there are any pets around you, be aware of where they are. Review your medicines with your doctor. Some medicines can make you feel dizzy. This can increase your chance of falling. Ask your doctor what other things that you can do to help prevent falls. This information is not intended to replace advice given to you by your health care provider. Make sure you discuss any questions you have with your health care provider. Document Released: 03/14/2009 Document Revised: 10/24/2015 Document Reviewed: 06/22/2014 Elsevier Interactive Patient Education  2017 [employer].

## 2022-01-12 ENCOUNTER — Encounter: Payer: Medicare Other | Admitting: Physical Therapy

## 2022-01-12 ENCOUNTER — Encounter: Payer: Self-pay | Admitting: Internal Medicine

## 2022-01-12 ENCOUNTER — Ambulatory Visit (INDEPENDENT_AMBULATORY_CARE_PROVIDER_SITE_OTHER): Payer: Medicare Other | Admitting: Internal Medicine

## 2022-01-12 DIAGNOSIS — K912 Postsurgical malabsorption, not elsewhere classified: Secondary | ICD-10-CM

## 2022-01-12 DIAGNOSIS — E538 Deficiency of other specified B group vitamins: Secondary | ICD-10-CM | POA: Diagnosis not present

## 2022-01-12 NOTE — Patient Instructions (Addendum)
Check with the [facility] to see if they cover the supplies. If they do we can fill out forms to help get these.  We have gotten the form filled out.

## 2022-01-12 NOTE — Progress Notes (Signed)
   Subjective:   Patient ID: [name], female    DOB: [birth year], 76 y.o.   MRN: [id number]  HPI The patient is a 76 YO female coming in for loose stools and form for [place] we need to review and fill out.  Review of Systems  Constitutional: Negative.   HENT: Negative.    Eyes: Negative.   Respiratory:  Negative for cough, chest tightness and shortness of breath.   Cardiovascular:  Negative for chest pain, palpitations and leg swelling.  Gastrointestinal:  Positive for abdominal pain and diarrhea. Negative for abdominal distention, constipation, nausea and vomiting.  Musculoskeletal:  Positive for arthralgias and gait problem.  Skin: Negative.   Neurological:  Positive for dizziness.  Psychiatric/Behavioral: Negative.      Objective:  Physical Exam Constitutional:      Appearance: She is well-developed.  HENT:     Head: Normocephalic and atraumatic.  Cardiovascular:     Rate and Rhythm: Normal rate and regular rhythm.  Pulmonary:     Effort: Pulmonary effort is normal. No respiratory distress.     Breath sounds: Normal breath sounds. No wheezing or rales.  Abdominal:     General: Bowel sounds are normal. There is no distension.     Palpations: Abdomen is soft.     Tenderness: There is no abdominal tenderness. There is no rebound.  Musculoskeletal:     Cervical back: Normal range of motion.  Skin:    General: Skin is warm and dry.  Neurological:     Mental Status: She is alert and oriented to person, place, and time.     Coordination: Coordination abnormal.     Vitals:   01/12/22 1108  BP: 122/80  Pulse: 71  Resp: 18  SpO2: 99%  Weight: 203 lb 3.2 oz (92.2 kg)  Height: '5\' 5"'$  (1.651 m)    Assessment & Plan:  Visit time 25 minutes in face to face communication with patient and coordination of care, additional 5 minutes spent in record review, coordination or care, ordering tests, communicating/referring to other healthcare professionals, documenting in  medical records all on the same day of the visit for total time 30 minutes spent on the visit.

## 2022-01-14 ENCOUNTER — Encounter: Payer: Medicare Other | Admitting: Physical Therapy

## 2022-01-15 ENCOUNTER — Encounter: Payer: Self-pay | Admitting: Internal Medicine

## 2022-01-15 NOTE — Assessment & Plan Note (Signed)
Causes diarrhea due to poor muscle tone. She also has absorptive issues with low potassium and magnesium due to this which causes muscle cramps and weakness. She is taking oral supplements for both which help slightly. Likely the magnesium supplement could be aggravating the diarrhea some.

## 2022-01-15 NOTE — Assessment & Plan Note (Signed)
Taking supplement and persistently low. She has had intermittent IV replacement but keeps persistently low levels due to absorption issues.

## 2022-01-15 NOTE — Assessment & Plan Note (Signed)
Due to poor absorption. Taking oral B12 daily which has kept her levels acceptable.

## 2022-01-16 DIAGNOSIS — H0102B Squamous blepharitis left eye, upper and lower eyelids: Secondary | ICD-10-CM | POA: Diagnosis not present

## 2022-01-16 DIAGNOSIS — D492 Neoplasm of unspecified behavior of bone, soft tissue, and skin: Secondary | ICD-10-CM | POA: Diagnosis not present

## 2022-01-16 DIAGNOSIS — Z961 Presence of intraocular lens: Secondary | ICD-10-CM | POA: Diagnosis not present

## 2022-01-16 DIAGNOSIS — H40013 Open angle with borderline findings, low risk, bilateral: Secondary | ICD-10-CM | POA: Diagnosis not present

## 2022-01-16 DIAGNOSIS — H0102A Squamous blepharitis right eye, upper and lower eyelids: Secondary | ICD-10-CM | POA: Diagnosis not present

## 2022-01-19 ENCOUNTER — Encounter: Payer: Medicare Other | Admitting: Physical Therapy

## 2022-01-23 ENCOUNTER — Encounter: Payer: Medicare Other | Admitting: Physical Therapy

## 2022-01-26 ENCOUNTER — Encounter: Payer: Medicare Other | Admitting: Physical Therapy

## 2022-01-28 ENCOUNTER — Encounter: Payer: Self-pay | Admitting: Surgical

## 2022-01-28 ENCOUNTER — Ambulatory Visit (INDEPENDENT_AMBULATORY_CARE_PROVIDER_SITE_OTHER): Payer: Medicare Other | Admitting: Surgical

## 2022-01-28 ENCOUNTER — Encounter: Payer: Medicare Other | Admitting: Physical Therapy

## 2022-01-28 DIAGNOSIS — Z923 Personal history of irradiation: Secondary | ICD-10-CM | POA: Diagnosis not present

## 2022-01-28 DIAGNOSIS — N6489 Other specified disorders of breast: Secondary | ICD-10-CM

## 2022-01-28 DIAGNOSIS — Z9889 Other specified postprocedural states: Secondary | ICD-10-CM

## 2022-01-28 DIAGNOSIS — C50312 Malignant neoplasm of lower-inner quadrant of left female breast: Secondary | ICD-10-CM | POA: Diagnosis not present

## 2022-01-28 NOTE — Progress Notes (Signed)
   Referring Provider Hoyt Koch, MD [address] [place],  [place] [address]   CC:  Chief Complaint  Patient presents with   Follow-up      [name] is an 76 y.o. female.  HPI: Patient is a 76 year old female here for follow-up after bilateral breast reduction on 06/09/2021 due to asymmetry after left breast lumpectomy and radiation to the left breast in 2013.  She presents today for concerns related to the right breast feeling larger than the left breast.  She previously discussed revision of the right breast with Dr. Claudia Desanctis, but at that time she did not want to pursue a revision.  Patient reports today that she would like to pursue revision of the right breast for improvement in symmetry.  She was notified that Dr. Claudia Desanctis is no longer here, would prefer to have a consultation with one of the other surgeons as soon as possible.  Review of Systems General: No fevers or chills  Physical Exam    01/12/2022   11:08 AM 12/09/2021    9:37 AM 09/29/2021   10:11 AM  Vitals with BMI  Height '5\' 5"'$  '5\' 5"'$  '5\' 5"'$   Weight 203 lbs 3 oz 204 lbs 207 lbs 13 oz  BMI 33.81 24.40 10.27  Systolic 253 664 403  Diastolic 80 72 76  Pulse 71 64 81    General:  No acute distress,  Alert and oriented, Non-Toxic, Normal speech and affect Bilateral breast: Bilateral breast incisions are intact, bilateral NAC's are viable.  STN on the right is 26 cm, STN on the left is 26 cm.  Nipple to fold on the right is 8 cm and nipple to fold on the left is 7 cm.  There is no erythema or cellulitic changes of either breast.  She does have some radiation changes to the left breast as well as some firmness of the left breast.  Assessment/Plan 76 year old female status post bilateral breast reduction for asymmetry related to left breast lumpectomy and radiation changes.  Patient had a discussion today with Dr. Erin Hearing about her options, discussed possible wedge resection of the lower pole of the right breast  for improved symmetry and shape and size.  Discussed radiation changes can cause breast shape changes that are unpredictable, discussed that surgery was not recommended on the left breast.  We will plan to submit this to insurance for revision of the right breast with primary excision and possible liposuction and possible revision of left breast for nipple areolar repositioning.  We will plan to have patient see Dr. Erin Hearing for preoperative appointment to further discuss.  Pictures were obtained of the patient and placed in the chart with the patient's or guardian's permission.   [name] 01/28/2022, 1:44 PM

## 2022-02-03 ENCOUNTER — Ambulatory Visit (INDEPENDENT_AMBULATORY_CARE_PROVIDER_SITE_OTHER): Payer: Medicare Other

## 2022-02-03 ENCOUNTER — Ambulatory Visit (INDEPENDENT_AMBULATORY_CARE_PROVIDER_SITE_OTHER): Payer: Medicare Other | Admitting: Podiatry

## 2022-02-03 DIAGNOSIS — M7661 Achilles tendinitis, right leg: Secondary | ICD-10-CM

## 2022-02-03 MED ORDER — METHYLPREDNISOLONE 4 MG PO TBPK: ORAL_TABLET | ORAL | 0 refills | Status: DC

## 2022-02-03 MED ORDER — MELOXICAM 15 MG PO TABS: 15.0000 mg | ORAL_TABLET | Freq: Every day | ORAL | 1 refills | Status: DC

## 2022-02-03 NOTE — Progress Notes (Signed)
Chief Complaint  Patient presents with   Foot Pain    Patient is here for foot pain that she has had for 1 week    HPI: 76 y.o. female presenting today for evaluation of right posterior heel pain has been going on for about 1 week now.  She denies a history of injury.  She says that she has been having some pain and tenderness to the posterior heel and it is increasingly symptomatic and causing her to limp.  She denies a history of injury and she has not done anything for treatment.  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 difficile colitis    Cataract    Diverticulosis of colon (without mention of hemorrhage)    Esophageal reflux 06/12/2008   Gastroparesis    GOITER, MULTINODULAR 04/02/2009   Gout, unspecified    H/O hiatal hernia    History of kidney stones    History of lower GI bleeding    History of radiation therapy 02/08/12-03/25/12   left breast,total 61gy   Hypokalemia 05/11/2013   Hypomagnesemia    HYPOTHYROIDISM, POST-RADIATION 08/13/2009   Internal hemorrhoids without mention of complication    Kidney stones    "several"   Leukopenia    Migraines    Obesity    On supplemental oxygen therapy    at night while sleeping - patient did not want to undergo sleep study   Osteoarthrosis, unspecified whether generalized or localized, unspecified site    Other and unspecified hyperlipidemia    Personal history of chemotherapy 2013   Personal history of radiation therapy 2013   left   PONV (postoperative nausea and vomiting)    PUD (peptic ulcer disease)    Short bowel syndrome    Shortness of breath on exertion    "sometimes"    Stricture and stenosis of esophagus    Thyrotoxicosis without mention of goiter or other cause, without mention of thyrotoxic crisis or storm    Type II or unspecified type diabetes mellitus without mention of complication, not stated as uncontrolled    no med in years diet controled   Unspecified essential hypertension    UTI (urinary tract infection)    Varicose veins    VITAMIN B12 DEFICIENCY 08/30/2009    Past Surgical History:  Procedure Laterality Date   ABDOMINAL ADHESION SURGERY  1980's thru 1990's   "several"   ABDOMINAL HYSTERECTOMY  1970's   with BSO   BREAST BIOPSY Left 08/13/11   left breast lower inner quadrant   BREAST BIOPSY Right 1985   Rt exc bx, benign   BREAST LUMPECTOMY Left 08/2011   BREAST LUMPECTOMY W/ NEEDLE LOCALIZATION  09/29/11   left  breast=lymph node,excision benign/ ER/PR=neg, her 2 Positive   BREAST REDUCTION SURGERY Bilateral 06/09/2021   Procedure: MAMMARY REDUCTION  (BREAST);  Surgeon: Cindra Presume, MD;  Location: [facility];  Service: Plastics;  Laterality: Bilateral;  2 hours   BUNIONECTOMY  1970's   bilateral   CHOLECYSTECTOMY  1990's   COLON SURGERY     "several surgeries for short bowel syndrome"   COLONOSCOPY  2012   multiple  DILATION AND CURETTAGE OF UTERUS     ESOPHAGOGASTRODUODENOSCOPY  2011   multiple    EXCISIONAL HEMORRHOIDECTOMY  11/10/2016   EYE SURGERY     "long time ago"   FLEXIBLE SIGMOIDOSCOPY  2011   multiple    KIDNEY STONE SURGERY  1990's   "tried to go up & get it but pushed it further up"   LITHOTRIPSY     "4 or 5 times"   MASTECTOMY W/ NODES PARTIAL  09/29/11   left   PORT-A-CATH REMOVAL Right 12/19/2013   Procedure: MINOR REMOVAL PORT-A-CATH;  Surgeon: Adin Hector, MD;  Location: [facility];  Service: General;  Laterality: Right;   PORTACATH PLACEMENT  09/29/2011   Procedure: INSERTION PORT-A-CATH;  Surgeon: Adin Hector, MD;  Location: [facility];  Service: General;  Laterality: N/A;   SMALL  INTESTINE SURGERY     Thyroid Ultrasound  12/1994 and 12/1995   TOTAL KNEE ARTHROPLASTY Right 06/05/2015   Procedure: RIGHT TOTAL KNEE ARTHROPLASTY;  Surgeon: Ninetta Lights, MD;  Location: [facility];  Service: Orthopedics;  Laterality: Right;   UPPER GASTROINTESTINAL ENDOSCOPY     VEIN LIGATION AND STRIPPING  1980's   Right leg    Allergies  Allergen Reactions   Aspirin Other (See Comments)    REACTION: Gi Intolerance/ Burning in stomach   Trazodone And Nefazodone Nausea And Vomiting    "sick"   Flagyl [Metronidazole] Rash   Morphine And Related Itching     Physical Exam: General: The patient is alert and oriented x3 in no acute distress.  Dermatology: Skin is warm, dry and supple bilateral lower extremities. Negative for open lesions or macerations.  Vascular: Palpable pedal pulses bilaterally. No edema or erythema noted. Capillary refill within normal limits.  Neurological: Epicritic and protective threshold grossly intact bilaterally.   Musculoskeletal Exam: Pain on palpation noted to the posterior tubercle of the right calcaneus at the insertion of the Achilles tendon consistent with retrocalcaneal bursitis. Range of motion within normal limits. Muscle strength 5/5 in all muscle groups bilateral lower extremities.  Radiographic Exam:  Small posterior calcaneal spur noted to the respective calcaneus on lateral view. No fracture or dislocation noted. Normal osseous mineralization noted.   Orthopedic screw noted within the first metatarsal  Assessment: 1. Insertional Achilles tendinitis right  Plan of Care:  1. Patient was evaluated. Radiographs were reviewed today. 2.  Patient declined physical therapy and cortisone injection 3.  Cam boot dispensed.  Weightbearing as tolerated x3 weeks 4.  Prescription for Medrol Dosepak 5.  Prescription for meloxicam 15 mg daily after completion of the Dosepak 6.  Return to clinic 4 weeks   Edrick Kins, DPM [facility]  Dr. Edrick Kins, DPM    [address], [place] [address]                Office [phone]  Fax [phone]

## 2022-02-04 ENCOUNTER — Encounter: Payer: Medicare Other | Admitting: Physical Therapy

## 2022-02-06 ENCOUNTER — Encounter: Payer: Medicare Other | Admitting: Physical Therapy

## 2022-02-16 ENCOUNTER — Encounter: Payer: Medicare Other | Admitting: Physical Therapy

## 2022-02-18 ENCOUNTER — Encounter: Payer: Medicare Other | Admitting: Physical Therapy

## 2022-02-18 DIAGNOSIS — N1831 Chronic kidney disease, stage 3a: Secondary | ICD-10-CM | POA: Diagnosis not present

## 2022-02-27 DIAGNOSIS — N2581 Secondary hyperparathyroidism of renal origin: Secondary | ICD-10-CM | POA: Diagnosis not present

## 2022-02-27 DIAGNOSIS — N1831 Chronic kidney disease, stage 3a: Secondary | ICD-10-CM | POA: Diagnosis not present

## 2022-02-27 DIAGNOSIS — I129 Hypertensive chronic kidney disease with stage 1 through stage 4 chronic kidney disease, or unspecified chronic kidney disease: Secondary | ICD-10-CM | POA: Diagnosis not present

## 2022-02-27 DIAGNOSIS — D631 Anemia in chronic kidney disease: Secondary | ICD-10-CM | POA: Diagnosis not present

## 2022-03-02 ENCOUNTER — Ambulatory Visit (INDEPENDENT_AMBULATORY_CARE_PROVIDER_SITE_OTHER): Payer: Medicare Other | Admitting: Podiatry

## 2022-03-02 ENCOUNTER — Encounter: Payer: Self-pay | Admitting: Podiatry

## 2022-03-02 DIAGNOSIS — M7661 Achilles tendinitis, right leg: Secondary | ICD-10-CM

## 2022-03-03 NOTE — Progress Notes (Signed)
Chief Complaint  Patient presents with   Foot Pain    Follow up achilles tendonitis right   "Its feeling much better"  Still taking meloxicam daily    HPI: 76 y.o. female presenting today for follow-up evaluation of right posterior heel pain.  Patient states that she feels significantly better.  She has no pain or tenderness during ambulation.  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 difficile colitis    Cataract    Diverticulosis of colon (without mention of hemorrhage)    Esophageal reflux 06/12/2008   Gastroparesis    GOITER, MULTINODULAR 04/02/2009   Gout, unspecified    H/O hiatal hernia    History of kidney stones    History of lower GI bleeding    History of radiation therapy 02/08/12-03/25/12   left breast,total 61gy   Hypokalemia 05/11/2013   Hypomagnesemia    HYPOTHYROIDISM, POST-RADIATION 08/13/2009   Internal hemorrhoids without mention of complication    Kidney stones    "several"   Leukopenia    Migraines    Obesity    On supplemental oxygen therapy    at night while sleeping - patient did not want to undergo sleep study   Osteoarthrosis, unspecified whether generalized or localized, unspecified site    Other and unspecified hyperlipidemia    Personal history of chemotherapy 2013   Personal history of radiation therapy 2013   left   PONV (postoperative nausea and vomiting)    PUD (peptic ulcer disease)    Short bowel syndrome    Shortness of breath on exertion    "sometimes"   Stricture and stenosis of esophagus    Thyrotoxicosis without mention of goiter or other cause, without mention of thyrotoxic crisis or storm    Type II or  unspecified type diabetes mellitus without mention of complication, not stated as uncontrolled    no med in years diet controled   Unspecified essential hypertension    UTI (urinary tract infection)    Varicose veins    VITAMIN B12 DEFICIENCY 08/30/2009    Past Surgical History:  Procedure Laterality Date   ABDOMINAL ADHESION SURGERY  1980's thru 1990's   "several"   ABDOMINAL HYSTERECTOMY  1970's   with BSO   BREAST BIOPSY Left 08/13/11   left breast lower inner quadrant   BREAST BIOPSY Right 1985   Rt exc bx, benign   BREAST LUMPECTOMY Left 08/2011   BREAST LUMPECTOMY W/ NEEDLE LOCALIZATION  09/29/11   left  breast=lymph node,excision benign/ ER/PR=neg, her 2 Positive   BREAST REDUCTION SURGERY Bilateral 06/09/2021   Procedure: MAMMARY REDUCTION  (BREAST);  Surgeon: Cindra Presume, MD;  Location: [facility];  Service: Plastics;  Laterality: Bilateral;  2 hours   BUNIONECTOMY  1970's   bilateral   CHOLECYSTECTOMY  1990's   COLON SURGERY     "several surgeries for short bowel syndrome"   COLONOSCOPY  2012   multiple    DILATION AND CURETTAGE OF UTERUS     ESOPHAGOGASTRODUODENOSCOPY  2011   multiple    EXCISIONAL HEMORRHOIDECTOMY  11/10/2016   EYE SURGERY     "long time ago"   FLEXIBLE  SIGMOIDOSCOPY  2011   multiple    KIDNEY STONE SURGERY  1990's   "tried to go up & get it but pushed it further up"   LITHOTRIPSY     "4 or 5 times"   MASTECTOMY W/ NODES PARTIAL  09/29/11   left   PORT-A-CATH REMOVAL Right 12/19/2013   Procedure: MINOR REMOVAL PORT-A-CATH;  Surgeon: Adin Hector, MD;  Location: [facility];  Service: General;  Laterality: Right;   PORTACATH PLACEMENT  09/29/2011   Procedure: INSERTION PORT-A-CATH;  Surgeon: Adin Hector, MD;  Location: [facility];  Service: General;  Laterality: N/A;   SMALL INTESTINE SURGERY     Thyroid Ultrasound  12/1994 and 12/1995   TOTAL KNEE ARTHROPLASTY Right 06/05/2015   Procedure: RIGHT TOTAL KNEE ARTHROPLASTY;  Surgeon:  Ninetta Lights, MD;  Location: [facility];  Service: Orthopedics;  Laterality: Right;   UPPER GASTROINTESTINAL ENDOSCOPY     VEIN LIGATION AND STRIPPING  1980's   Right leg    Allergies  Allergen Reactions   Aspirin Other (See Comments)    REACTION: Gi Intolerance/ Burning in stomach   Trazodone And Nefazodone Nausea And Vomiting    "sick"   Flagyl [Metronidazole] Rash   Morphine And Related Itching     Physical Exam: General: The patient is alert and oriented x3 in no acute distress.  Dermatology: Skin is warm, dry and supple bilateral lower extremities. Negative for open lesions or macerations.  Vascular: Palpable pedal pulses bilaterally. No edema or erythema noted. Capillary refill within normal limits.  Neurological: Epicritic and protective threshold grossly intact bilaterally.   Musculoskeletal Exam: Negative for any significant pain on palpation to the posterior tubercle of the right calcaneus.  No pedal deformity  Radiographic Exam RT foot 02/03/2022:  Small posterior calcaneal spur noted to the respective calcaneus on lateral view. No fracture or dislocation noted. Normal osseous mineralization noted.   Orthopedic screw noted within the first metatarsal  Assessment: 1. Insertional Achilles tendinitis right; resolved  Plan of Care:  1. Patient was evaluated.  2.  Patient may resume full activity no restrictions.  Recommend good supportive shoes and sneakers 3.  Recommend daily stretching exercises 4.  Patient may discontinue the meloxicam.  Take as needed 5.  Return to clinic as needed  Edrick Kins, DPM [facility]  Dr. Edrick Kins, DPM    [address], [place] [address]                Office [phone]  Fax [phone]

## 2022-03-18 DIAGNOSIS — Z23 Encounter for immunization: Secondary | ICD-10-CM | POA: Diagnosis not present

## 2022-03-25 ENCOUNTER — Telehealth: Payer: Self-pay | Admitting: Internal Medicine

## 2022-03-25 DIAGNOSIS — E890 Postprocedural hypothyroidism: Secondary | ICD-10-CM

## 2022-03-25 NOTE — Telephone Encounter (Signed)
REFILL FOR: famotidine 40 mg Levothyroxine  Amitriptyline  pregabalin 75 mg  [facility] in [place]  Patient [phone]  or [phone]

## 2022-03-26 ENCOUNTER — Telehealth: Payer: Self-pay | Admitting: Family Medicine

## 2022-03-26 MED ORDER — FAMOTIDINE 40 MG PO TABS: 40.0000 mg | ORAL_TABLET | Freq: Every day | ORAL | 3 refills | Status: DC

## 2022-03-26 MED ORDER — LEVOTHYROXINE SODIUM 175 MCG PO TABS: ORAL_TABLET | ORAL | 0 refills | Status: DC

## 2022-03-26 NOTE — Telephone Encounter (Signed)
Notified pt --furosemide was discontinue in 07/2021. Pt requesting refill furosemide 40 mg-1 tab daily Please advise

## 2022-03-26 NOTE — Addendum Note (Signed)
Addended by: Pricilla Holm A on: 03/26/2022 11:41 AM   Modules accepted: Orders

## 2022-03-26 NOTE — Telephone Encounter (Signed)
Spoke to pt--famotidine 40 mg 1 tab daly, furosemide 40 mg  1 tab daily not on med list and amitriptyline. Pt aware other medication request prescribe by other provider. Please advise

## 2022-03-26 NOTE — Telephone Encounter (Signed)
Patient called requesting a refill on Pregabalin '75mg'$    Pharmacy: [facility]

## 2022-03-26 NOTE — Telephone Encounter (Signed)
Patient states that she is looking at the bottle and that she got this in February 2023 and it had 3 refills.  She said that it says E. Crawford - provider.  Patient was here on 07/08/2021 and it lists lasix as her current medication.

## 2022-03-26 NOTE — Telephone Encounter (Signed)
Pt been notified regarding medication refills. Pt found out

## 2022-03-26 NOTE — Telephone Encounter (Signed)
Sent in pepcid and levothyroxine. Amitriptyline should have plenty of refills. I do not see that she takes lasix or pregabalin so should not fill.

## 2022-03-26 NOTE — Telephone Encounter (Signed)
Pt stated-will call Dr. Posey Pronto (kidney specialist) for refill for furosemide.

## 2022-03-27 NOTE — Telephone Encounter (Signed)
Lasix was stopped due to severe low potassium she should not resume and has been off for some time.

## 2022-03-30 NOTE — Telephone Encounter (Signed)
Pt voiced understanding

## 2022-04-01 MED ORDER — PREGABALIN 75 MG PO CAPS: 75.0000 mg | ORAL_CAPSULE | Freq: Two times a day (BID) | ORAL | 3 refills | Status: DC | PRN

## 2022-04-01 NOTE — Telephone Encounter (Signed)
Will send it

## 2022-04-01 NOTE — Telephone Encounter (Signed)
Mailed

## 2022-04-10 ENCOUNTER — Ambulatory Visit: Payer: Medicare Other | Admitting: Obstetrics & Gynecology

## 2022-04-15 ENCOUNTER — Encounter: Payer: Self-pay | Admitting: Plastic Surgery

## 2022-04-15 ENCOUNTER — Ambulatory Visit (INDEPENDENT_AMBULATORY_CARE_PROVIDER_SITE_OTHER): Payer: Medicare Other | Admitting: Plastic Surgery

## 2022-04-15 VITALS — BP 145/88 | HR 79 | Ht 65.0 in | Wt 203.0 lb

## 2022-04-15 DIAGNOSIS — N6481 Ptosis of breast: Secondary | ICD-10-CM | POA: Diagnosis not present

## 2022-04-15 DIAGNOSIS — Z923 Personal history of irradiation: Secondary | ICD-10-CM | POA: Diagnosis not present

## 2022-04-15 NOTE — Progress Notes (Signed)
Referring Provider Hoyt Koch, MD [address] [place],  [place] [address]   CC:  Chief Complaint  Patient presents with   Consult      [name] is an 76 y.o. female.  HPI: [name] is a 76 year old female who previously underwent to a breast reduction on the right and a breast lift on the left by one of my partners.  She returns today because she is unhappy with that the ptosis on the right side.  During her appointment she also told me that she had a firm area at the 4 o'clock position of the left areola.  This is developed since her surgery.  A mammogram performed in June of this year was a BI-RADS 1 with no suspicious findings noted.  Allergies  Allergen Reactions   Aspirin Other (See Comments)    REACTION: Gi Intolerance/ Burning in stomach   Trazodone And Nefazodone Nausea And Vomiting    "sick"   Flagyl [Metronidazole] Rash   Morphine And Related Itching    Outpatient Encounter Medications as of 04/15/2022  Medication Sig   albuterol (VENTOLIN HFA) 108 (90 Base) MCG/ACT inhaler Inhale 2 puffs into the lungs every 6 (six) hours as needed for wheezing or shortness of breath.   allopurinol (ZYLOPRIM) 100 MG tablet Take 2 tablets (200 mg total) by mouth daily.   amitriptyline (ELAVIL) 50 MG tablet TAKE ONE TABLET BY MOUTH EVERY DAY AT BEDTIME (USE CAUTION - MAY CAUSE DROWSINESS)   calcium carbonate (TUMS - DOSED IN MG ELEMENTAL CALCIUM) 500 MG chewable tablet Chew 1 tablet by mouth daily as needed for indigestion or heartburn.   Calcium Carbonate-Vitamin D (CALCIUM-VITAMIN D) 600-200 MG-UNIT CAPS Take 1 capsule by mouth daily.   cholecalciferol (VITAMIN D) 25 MCG (1000 UNIT) tablet Take 1,000 Units by mouth daily.   colchicine 0.6 MG tablet Take 1 tablet (0.6 mg total) by mouth daily as needed (gout or psuedogout pain).   Cyanocobalamin (VITAMIN B 12 PO) Take 1,000 mcg by mouth daily.   dicyclomine (BENTYL) 20 MG tablet Take 1 tablet (20 mg total)  by mouth 3 (three) times daily before meals.   famotidine (PEPCID) 40 MG tablet Take 1 tablet (40 mg total) by mouth daily.   ferrous sulfate 325 (65 FE) MG tablet Take 1 tablet (325 mg total) by mouth daily with breakfast.   fluticasone (FLONASE) 50 MCG/ACT nasal spray Place 2 sprays into both nostrils daily.   HYDROcodone-acetaminophen (NORCO/VICODIN) 5-325 MG tablet Take 1 tablet by mouth every 6 (six) hours as needed.   levothyroxine (SYNTHROID) 175 MCG tablet TAKE ONE TABLET BY MOUTH EVERY DAY BEFORE BREAKFAST.   loratadine (CLARITIN) 10 MG tablet Take 1 tablet (10 mg total) by mouth daily.   lovastatin (MEVACOR) 20 MG tablet TAKE ONE TABLET BY MOUTH EVERY DAY AT 6:00PM   magnesium oxide (MAG-OX) 400 MG tablet Take 1 tablet (400 mg total) by mouth 2 (two) times daily.   meloxicam (MOBIC) 15 MG tablet Take 1 tablet (15 mg total) by mouth daily.   nitroGLYCERIN (NITROSTAT) 0.4 MG SL tablet Place 1 tablet (0.4 mg total) under the tongue every 5 (five) minutes as needed.   ondansetron (ZOFRAN) 4 MG tablet Take 1 tablet (4 mg total) by mouth every 8 (eight) hours as needed for nausea or vomiting.   pantoprazole (PROTONIX) 40 MG tablet TAKE 1 TABLET BY MOUTH 30 MINUTES PRIOR TO BREAKFAST AND SUPPER   potassium chloride SA (KLOR-CON M) 20 MEQ  tablet Take 1 tablet (20 mEq total) by mouth daily.   pregabalin (LYRICA) 75 MG capsule Take 1 capsule (75 mg total) by mouth 2 (two) times daily as needed (nerve pain).   No facility-administered encounter medications on file as of 04/15/2022.     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 difficile colitis    Cataract    Diverticulosis of colon (without mention of  hemorrhage)    Esophageal reflux 06/12/2008   Gastroparesis    GOITER, MULTINODULAR 04/02/2009   Gout, unspecified    H/O hiatal hernia    History of kidney stones    History of lower GI bleeding    History of radiation therapy 02/08/12-03/25/12   left breast,total 61gy   Hypokalemia 05/11/2013   Hypomagnesemia    HYPOTHYROIDISM, POST-RADIATION 08/13/2009   Internal hemorrhoids without mention of complication    Kidney stones    "several"   Leukopenia    Migraines    Obesity    On supplemental oxygen therapy    at night while sleeping - patient did not want to undergo sleep study   Osteoarthrosis, unspecified whether generalized or localized, unspecified site    Other and unspecified hyperlipidemia    Personal history of chemotherapy 2013   Personal history of radiation therapy 2013   left   PONV (postoperative nausea and vomiting)    PUD (peptic ulcer disease)    Short bowel syndrome    Shortness of breath on exertion    "sometimes"   Stricture and stenosis of esophagus    Thyrotoxicosis without mention of goiter or other cause, without mention of thyrotoxic crisis or storm    Type II or unspecified type diabetes mellitus without mention of complication, not stated as uncontrolled    no med in years diet controled   Unspecified essential hypertension    UTI (urinary tract infection)    Varicose veins    VITAMIN B12 DEFICIENCY 08/30/2009    Past Surgical History:  Procedure Laterality Date   ABDOMINAL ADHESION SURGERY  1980's thru 1990's   "several"   ABDOMINAL HYSTERECTOMY  1970's   with BSO   BREAST BIOPSY Left 08/13/11   left breast lower inner quadrant   BREAST BIOPSY Right 1985   Rt exc bx, benign   BREAST LUMPECTOMY Left 08/2011   BREAST LUMPECTOMY W/ NEEDLE LOCALIZATION  09/29/11   left  breast=lymph node,excision benign/ ER/PR=neg, her 2 Positive   BREAST REDUCTION SURGERY Bilateral 06/09/2021   Procedure: MAMMARY REDUCTION  (BREAST);  Surgeon: Cindra Presume, MD;  Location: [facility];  Service: Plastics;  Laterality: Bilateral;  2 hours   BUNIONECTOMY  1970's   bilateral   CHOLECYSTECTOMY  1990's   COLON SURGERY     "several surgeries for short bowel syndrome"   COLONOSCOPY  2012   multiple    DILATION AND CURETTAGE OF UTERUS     ESOPHAGOGASTRODUODENOSCOPY  2011   multiple    EXCISIONAL HEMORRHOIDECTOMY  11/10/2016   EYE SURGERY     "long time ago"   FLEXIBLE SIGMOIDOSCOPY  2011   multiple    KIDNEY STONE SURGERY  1990's   "tried to go up & get it  but pushed it further up"   LITHOTRIPSY     "4 or 5 times"   MASTECTOMY W/ NODES PARTIAL  09/29/11   left   PORT-A-CATH REMOVAL Right 12/19/2013   Procedure: MINOR REMOVAL PORT-A-CATH;  Surgeon: Adin Hector, MD;  Location: [facility];  Service: General;  Laterality: Right;   PORTACATH PLACEMENT  09/29/2011   Procedure: INSERTION PORT-A-CATH;  Surgeon: Adin Hector, MD;  Location: [facility];  Service: General;  Laterality: N/A;   SMALL INTESTINE SURGERY     Thyroid Ultrasound  12/1994 and 12/1995   TOTAL KNEE ARTHROPLASTY Right 06/05/2015   Procedure: RIGHT TOTAL KNEE ARTHROPLASTY;  Surgeon: Ninetta Lights, MD;  Location: [facility];  Service: Orthopedics;  Laterality: Right;   UPPER GASTROINTESTINAL ENDOSCOPY     VEIN LIGATION AND STRIPPING  1980's   Right leg    Family History  Problem Relation Age of Onset   Esophageal cancer Son        deceased   Breast cancer Son        esophageal   Diabetes Mother    Heart disease Mother    Kidney disease Sister    Diabetes Father    Hypertension Father    Kidney disease Brother        x 3   Colon cancer Paternal Uncle    Breast cancer Maternal Aunt    Breast cancer Paternal Aunt    Breast cancer Cousin        Pt states she has 15+ cousins w/ Breast CA   Rectal cancer Neg Hx    Stomach cancer Neg Hx     Social History   Social History Narrative   Not on file     Review of Systems General: Denies fevers, chills, weight  loss CV: Denies chest pain, shortness of breath, palpitations Breasts: History of radiation therapy to the left breast as part of her treatment for invasive ductal carcinoma with ductal carcinoma in situ.  Notes a firm mass in the left breast.  No complaints of drainage erythema from her incisions.  Physical Exam    04/15/2022   10:39 AM 01/12/2022   11:08 AM 12/09/2021    9:37 AM  Vitals with BMI  Height '5\' 5"'$  '5\' 5"'$  '5\' 5"'$   Weight 203 lbs 203 lbs 3 oz 204 lbs  BMI 33.78 55.73 22.02  Systolic 542 706 237  Diastolic 88 80 72  Pulse 79 71 64    General:  No acute distress,  Alert and oriented, Non-Toxic, Normal speech and affect Breast: Left breast as noted above has changes consistent with radiation therapy.  She does have a small area of firmness at the 4 o'clock position of the left areola.  The nipple somewhat malpositioned.  The right breast has been reduced all incisions are well-healed and there is a grade 1 ptosis. Mammogram: June 2023 BI-RADS 1 Assessment/Plan Status post breast reduction on the right and breast lift on the left.  She is unhappy with the ptosis on the right.  I discussed with her the fact that while she could undergo revision of the surgery it is unlikely that any surgery that I performed will result in long-term upper pole fullness due to her skin laxity.  I did not recommend surgery for her.  The mass in the left breast appears to be scar tissue from her recent surgery however she will return for close follow-up in 4 to 6 months for exam.  If  the mass is unchanged or larger I will plan excision for pathologic diagnosis.  The patient understands and agrees.  Follow-up 4 to 6 months  Camillia Herter 04/15/2022, 4:21 PM

## 2022-04-27 NOTE — Progress Notes (Unsigned)
Patient ID: [name], female   DOB: [birth year], 76 y.o.   MRN: [id number]  HPI: [name] is a 76 y.o.-year-old female, returning for follow-up for DM2, dx in 2012, non-insulin-dependent, controlled, with complications (gastroparesis, peripheral neuropathy), also post ablative hypothyroidism, multinodular goiter. Pt. previously saw Dr. Loanne Drilling, last visit 8 months ago..  Reviewed HbA1c: Lab Results  Component Value Date   HGBA1C 6.1 (A) 08/28/2021   HGBA1C 5.5 05/29/2021   HGBA1C 5.6 02/27/2021   HGBA1C 5.6 08/20/2020   HGBA1C 5.6 12/18/2019   HGBA1C 5.8 (A) 10/13/2019   HGBA1C 5.5 08/21/2019   HGBA1C 5.5 06/16/2019   HGBA1C 5.5 03/17/2018   HGBA1C 5.7 06/28/2017   Patient's diabetes is diet controlled.  Pt is not checking her sugars, she does not have a meter. - am: n/c - 2h after b'fast: n/c - before lunch: n/c - 2h after lunch: n/c - before dinner: n/c - 2h after dinner: n/c - bedtime: n/c - nighttime: n/c  - no CKD, last BUN/creatinine:  Lab Results  Component Value Date   BUN 17 09/25/2021   BUN 14 08/29/2021   CREATININE 0.99 09/25/2021   CREATININE 1.36 (H) 08/29/2021   -+ HL; last set of lipids: Lab Results  Component Value Date   CHOL 150 12/13/2020   HDL 70.00 12/13/2020   LDLCALC 55 12/13/2020   LDLDIRECT 53.0 10/29/2015   TRIG 128.0 12/13/2020   CHOLHDL 2 12/13/2020  On lovastatin 20 mg daily.  - last eye exam was in 2022. No DR reportedly.   - No numbness and tingling in her feet - resolved.   She was on Lyrica 75 mg daily and Elavil 50 mg daily, previously.  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  Pt is on levothyroxine 175 mcg daily, taken: - in am - fasting - at least 30 min from b'fast - + calcium carbonate later in the day - + iron in am along with LT4 (started >1 year ago) - no multivitamins - + PPIs: Protonix 40 mg daily, also on Pepcid 40 mg daily - after lunch and dinner - not on  Biotin  Reviewed her TSH levels: Lab Results  Component Value Date   TSH 3.30 08/22/2021   TSH 0.32 (L) 02/27/2021   TSH 4.49 08/20/2020   TSH 12.91 (H) 12/18/2019   TSH 0.08 (L) 08/17/2019   TSH 0.34 (L) 07/17/2019   TSH 8.00 (H) 06/16/2019   TSH 0.47 12/27/2018   TSH 0.73 03/17/2018   TSH 28.21 (H) 02/16/2018   TSH 2.37 06/28/2017   TSH 1.13 05/01/2016   TSH 3.56 12/11/2015   TSH 7.57 (H) 10/29/2015   TSH 0.41 04/20/2014   TSH 0.83 08/28/2013   TSH 41.669 (H) 08/03/2013   TSH 13.621 (H) 05/11/2013   TSH 2.25 11/10/2012   TSH 5.62 (H) 07/01/2012   Multinodular goiter: -Stable, small, thyroid nodules per latest thyroid ultrasound from 08/2021:  Thyroid U/S (09/08/2021): Parenchymal Echotexture: Moderately heterogenous  Isthmus: 4 mm  Right lobe: 2.7 x 1.0 x 1.1 cm  Left lobe: 2.9 x 1.1 x 1.5 cm  _________________________________________________________   Estimated total number of nodules >/= 1 cm: 1 _________________________________________________________   Stable 6 mm solid isoechoic TR 3 type nodule in the right mid thyroid. No follow-up will be recommended.   The previously biopsy left mid thyroid solid hypoechoic TR 4 nodule is similar in appearance in size measuring 1.5 x 0.9 x 0.9 cm, previously 1.4 x  0.8 x 0.6 cm. Correlate with prior pathology.   Stable background thyroid heterogeneity and atrophy. No hypervascularity. No regional adenopathy.   IMPRESSION: Stable 1.5 cm left mid thyroid TR 4 nodule, previously biopsied. Stable right mid thyroid subcentimeter TR 3 nodule. No new or enlarging thyroid nodule.  Pt denies: - feeling nodules in neck - hoarseness - choking - SOB with lying down She had Es stretching in the past - not in a long time. She has dysphagia and hoarseness. She has GERD.  No family history of thyroid cancer or personal history of radiation therapy to head or neck.  She also has a history of HTN, iron deficiency anemia, B12  deficiency, vitamin D deficiency, breast cancer, gout, osteoarthritis, increased LFTs, DJD.  ROS: + see HPI No increased urination, blurry vision, nausea, chest pain.  Past Medical History:  Diagnosis Date   Allergy    ANEMIA, IRON DEFICIENCY 05/08/2009   Angina    ASYMPTOMATIC POSTMENOPAUSAL STATUS 10/11/2008   Blood transfusion    Blood transfusion without reported diagnosis    Breast cancer ([place]) 09/29/2011   invasive grade III ductal ca,assoc high grade dcis,ER/PR=neg; left breast   C. difficile colitis    Cataract    Diverticulosis of colon (without mention of hemorrhage)    Esophageal reflux 06/12/2008   Gastroparesis    GOITER, MULTINODULAR 04/02/2009   Gout, unspecified    H/O hiatal hernia    History of kidney stones    History of lower GI bleeding    History of radiation therapy 02/08/12-03/25/12   left breast,total 61gy   Hypokalemia 05/11/2013   Hypomagnesemia    HYPOTHYROIDISM, POST-RADIATION 08/13/2009   Internal hemorrhoids without mention of complication    Kidney stones    "several"   Leukopenia    Migraines    Obesity    On supplemental oxygen therapy    at night while sleeping - patient did not want to undergo sleep study   Osteoarthrosis, unspecified whether generalized or localized, unspecified site    Other and unspecified hyperlipidemia    Personal history of chemotherapy 2013   Personal history of radiation therapy 2013   left   PONV (postoperative nausea and vomiting)    PUD (peptic ulcer disease)    Short bowel syndrome    Shortness of breath on exertion    "sometimes"   Stricture and stenosis of esophagus    Thyrotoxicosis without mention of goiter or other cause, without mention of thyrotoxic crisis or storm    Type II or unspecified type diabetes mellitus without mention of complication, not stated as uncontrolled    no med in years diet controled   Unspecified essential hypertension    UTI (urinary tract infection)    Varicose veins     VITAMIN B12 DEFICIENCY 08/30/2009   Past Surgical History:  Procedure Laterality Date   ABDOMINAL ADHESION SURGERY  1980's thru 1990's   "several"   ABDOMINAL HYSTERECTOMY  1970's   with BSO   BREAST BIOPSY Left 08/13/11   left breast lower inner quadrant   BREAST BIOPSY Right 1985   Rt exc bx, benign   BREAST LUMPECTOMY Left 08/2011   BREAST LUMPECTOMY W/ NEEDLE LOCALIZATION  09/29/11   left  breast=lymph node,excision benign/ ER/PR=neg, her 2 Positive   BREAST REDUCTION SURGERY Bilateral 06/09/2021   Procedure: MAMMARY REDUCTION  (BREAST);  Surgeon: Cindra Presume, MD;  Location: [facility];  Service: Plastics;  Laterality: Bilateral;  2 hours   BUNIONECTOMY  1970's  bilateral   CHOLECYSTECTOMY  1990's   COLON SURGERY     "several surgeries for short bowel syndrome"   COLONOSCOPY  2012   multiple    DILATION AND CURETTAGE OF UTERUS     ESOPHAGOGASTRODUODENOSCOPY  2011   multiple    EXCISIONAL HEMORRHOIDECTOMY  11/10/2016   EYE SURGERY     "long time ago"   FLEXIBLE SIGMOIDOSCOPY  2011   multiple    KIDNEY STONE SURGERY  1990's   "tried to go up & get it but pushed it further up"   LITHOTRIPSY     "4 or 5 times"   MASTECTOMY W/ NODES PARTIAL  09/29/11   left   PORT-A-CATH REMOVAL Right 12/19/2013   Procedure: MINOR REMOVAL PORT-A-CATH;  Surgeon: Adin Hector, MD;  Location: [facility];  Service: General;  Laterality: Right;   PORTACATH PLACEMENT  09/29/2011   Procedure: INSERTION PORT-A-CATH;  Surgeon: Adin Hector, MD;  Location: [facility];  Service: General;  Laterality: N/A;   SMALL INTESTINE SURGERY     Thyroid Ultrasound  12/1994 and 12/1995   TOTAL KNEE ARTHROPLASTY Right 06/05/2015   Procedure: RIGHT TOTAL KNEE ARTHROPLASTY;  Surgeon: Ninetta Lights, MD;  Location: [facility];  Service: Orthopedics;  Laterality: Right;   UPPER GASTROINTESTINAL ENDOSCOPY     VEIN LIGATION AND STRIPPING  1980's   Right leg   Social History   Socioeconomic History    Marital status: Widowed    Spouse name: Not on file   Number of children: 2   Years of education: 10   Highest education level: 10th grade  Occupational History   Occupation: RETIRED  Tobacco Use   Smoking status: Former    Packs/day: 1.00    Years: 10.00    Total pack years: 10.00    Types: Cigarettes    Quit date: 09/22/1985    Years since quitting: 36.6   Smokeless tobacco: Never  Vaping Use   Vaping Use: Never used  Substance and Sexual Activity   Alcohol use: No   Drug use: No   Sexual activity: Not Currently    Birth control/protection: Surgical    Comment: 1st intercourse- 15, partners- 69  Other Topics Concern   Not on file  Social History Narrative   Not on file   Social Determinants of Health   Financial Resource Strain: High Risk (01/09/2022)   Overall Financial Resource Strain (CARDIA)    Difficulty of Paying Living Expenses: Very hard  Food Insecurity: No Food Insecurity (01/09/2022)   Hunger Vital Sign    Worried About Running Out of Food in the Last Year: Never true    Ran Out of Food in the Last Year: Never true  Transportation Needs: No Transportation Needs (01/09/2022)   PRAPARE - Hydrologist (Medical): No    Lack of Transportation (Non-Medical): No  Physical Activity: Inactive (01/09/2022)   Exercise Vital Sign    Days of Exercise per Week: 0 days    Minutes of Exercise per Session: 0 min  Stress: No Stress Concern Present (01/09/2022)   [place]    Feeling of Stress : Not at all  Social Connections: Moderately Integrated (01/09/2022)   Social Connection and Isolation Panel [NHANES]    Frequency of Communication with Friends and Family: More than three times a week    Frequency of Social Gatherings with Friends and Family: Not on file  Attends Religious Services: More than 4 times per year    Active Member of Clubs or Organizations: Yes    Attends English as a second language teacher Meetings: More than 4 times per year    Marital Status: Widowed  Intimate Partner Violence: Not At Risk (01/09/2022)   Humiliation, Afraid, Rape, and Kick questionnaire    Fear of Current or Ex-Partner: No    Emotionally Abused: No    Physically Abused: No    Sexually Abused: No   Current Outpatient Medications on File Prior to Visit  Medication Sig Dispense Refill   albuterol (VENTOLIN HFA) 108 (90 Base) MCG/ACT inhaler Inhale 2 puffs into the lungs every 6 (six) hours as needed for wheezing or shortness of breath. 54 g 4   allopurinol (ZYLOPRIM) 100 MG tablet Take 2 tablets (200 mg total) by mouth daily. 180 tablet 3   amitriptyline (ELAVIL) 50 MG tablet TAKE ONE TABLET BY MOUTH EVERY DAY AT BEDTIME (USE CAUTION - MAY CAUSE DROWSINESS) 90 tablet 3   calcium carbonate (TUMS - DOSED IN MG ELEMENTAL CALCIUM) 500 MG chewable tablet Chew 1 tablet by mouth daily as needed for indigestion or heartburn.     Calcium Carbonate-Vitamin D (CALCIUM-VITAMIN D) 600-200 MG-UNIT CAPS Take 1 capsule by mouth daily.     cholecalciferol (VITAMIN D) 25 MCG (1000 UNIT) tablet Take 1,000 Units by mouth daily.     colchicine 0.6 MG tablet Take 1 tablet (0.6 mg total) by mouth daily as needed (gout or psuedogout pain). 90 tablet 2   Cyanocobalamin (VITAMIN B 12 PO) Take 1,000 mcg by mouth daily.     dicyclomine (BENTYL) 20 MG tablet Take 1 tablet (20 mg total) by mouth 3 (three) times daily before meals. 270 tablet 11   famotidine (PEPCID) 40 MG tablet Take 1 tablet (40 mg total) by mouth daily. 90 tablet 3   ferrous sulfate 325 (65 FE) MG tablet Take 1 tablet (325 mg total) by mouth daily with breakfast. 90 tablet 0   fluticasone (FLONASE) 50 MCG/ACT nasal spray Place 2 sprays into both nostrils daily. 48 g 3   HYDROcodone-acetaminophen (NORCO/VICODIN) 5-325 MG tablet Take 1 tablet by mouth every 6 (six) hours as needed. 15 tablet 0   levothyroxine (SYNTHROID) 175 MCG tablet TAKE ONE TABLET BY MOUTH  EVERY DAY BEFORE BREAKFAST. 90 tablet 0   loratadine (CLARITIN) 10 MG tablet Take 1 tablet (10 mg total) by mouth daily. 90 tablet 1   lovastatin (MEVACOR) 20 MG tablet TAKE ONE TABLET BY MOUTH EVERY DAY AT 6:00PM 90 tablet 3   magnesium oxide (MAG-OX) 400 MG tablet Take 1 tablet (400 mg total) by mouth 2 (two) times daily. 180 tablet 1   meloxicam (MOBIC) 15 MG tablet Take 1 tablet (15 mg total) by mouth daily. 30 tablet 1   nitroGLYCERIN (NITROSTAT) 0.4 MG SL tablet Place 1 tablet (0.4 mg total) under the tongue every 5 (five) minutes as needed. 25 tablet 6   ondansetron (ZOFRAN) 4 MG tablet Take 1 tablet (4 mg total) by mouth every 8 (eight) hours as needed for nausea or vomiting. 20 tablet 0   pantoprazole (PROTONIX) 40 MG tablet TAKE 1 TABLET BY MOUTH 30 MINUTES PRIOR TO BREAKFAST AND SUPPER 180 tablet 1   potassium chloride SA (KLOR-CON M) 20 MEQ tablet Take 1 tablet (20 mEq total) by mouth daily. 90 tablet 3   pregabalin (LYRICA) 75 MG capsule Take 1 capsule (75 mg total) by mouth 2 (two) times daily as  needed (nerve pain). 180 capsule 3   No current facility-administered medications on file prior to visit.   Allergies  Allergen Reactions   Aspirin Other (See Comments)    REACTION: Gi Intolerance/ Burning in stomach   Trazodone And Nefazodone Nausea And Vomiting    "sick"   Flagyl [Metronidazole] Rash   Morphine And Related Itching   Family History  Problem Relation Age of Onset   Esophageal cancer Son        deceased   Breast cancer Son        esophageal   Diabetes Mother    Heart disease Mother    Kidney disease Sister    Diabetes Father    Hypertension Father    Kidney disease Brother        x 3   Colon cancer Paternal Uncle    Breast cancer Maternal Aunt    Breast cancer Paternal Aunt    Breast cancer Cousin        Pt states she has 15+ cousins w/ Breast CA   Rectal cancer Neg Hx    Stomach cancer Neg Hx    PE: BP 128/82 (BP Location: Right Arm, Patient  Position: Sitting, Cuff Size: Normal)   Pulse 87   Ht '5\' 5"'$  (1.651 m)   Wt 208 lb 6.4 oz (94.5 kg)   SpO2 99%   BMI 34.68 kg/m  Wt Readings from Last 3 Encounters:  04/28/22 208 lb 6.4 oz (94.5 kg)  04/15/22 203 lb (92.1 kg)  01/12/22 203 lb 3.2 oz (92.2 kg)   Constitutional: overweight, in NAD Eyes:  EOMI, no exophthalmos ENT: no neck masses, no cervical lymphadenopathy Cardiovascular: RRR, No MRG Respiratory: CTA B Musculoskeletal: no deformities Skin:no rashes Neurological: no tremor with outstretched hands  ASSESSMENT: 1. DM2, non-insulin-dependent, controlled, with long-term complications - Gastroparesis - PN  2.  Post ablative hypothyroidism  3.  Multinodular goiter  PLAN:  1. Patient with long-standing, controlled diabetes, not on antidiabetic medications.  At last visit with Dr. Loanne Drilling, HbA1c was higher, but excellent, at 6.1%.  At today's visit, HbA1c is 5.6% (lower). -Patient is not checking blood sugars and she does not have a meter.  At today's visit, we discussed about the importance of starting to check at least every other day and whenever she feels poorly.  We sent a prescription for meter and supplies to her pharmacy.  We discussed about targets for blood sugars.  Based on the HbA1c today, she does not need to add any medications for now. -I advised her to: Patient Instructions  Please continue Levothyroxine 175 mcg daily.  Take the thyroid hormone every day, with water, at least 30 minutes before breakfast, separated by at least 4 hours from: - acid reflux medications - calcium - iron - multivitamins  Move the Iron to before lunch.  Please stop at the lab.  Please return in 6 months with your sugar log.   - discussed about CBG targets for treatment: 80-130 mg/dL before meals and <180 mg/dL after meals; target HbA1c <7%. - given foot care handout  - given instructions for hypoglycemia management "15-15 rule"  - advised for yearly eye exams  -  will check a lipid panel today - Return to clinic in 6 months  2.  Post ablative hypothyroidism - latest thyroid labs reviewed with pt. >> normal: Lab Results  Component Value Date   TSH 3.30 08/22/2021  - she continues on LT4 175 mcg daily - pt feels good  on this dose. - we discussed about taking the thyroid hormone every day, with water, >30 minutes before breakfast, separated by >4 hours from acid reflux medications, calcium, iron, multivitamins. Pt. is taking it incorrectl: takes Fe along with LT4 >> will move Fe to before lunch, but first we will need to check labs to see if we do not need to reduce the dose of LT4 - will check thyroid tests today: TSH and fT4 - If labs are abnormal, she will need to return for repeat TFTs in 1.5 months  3.  Multinodular goiter -She had dysphagia and hoarseness, most likely related to her acid reflux and esophageal strictures.  These were stretched in the past and she would like to schedule another appointment with her gastroenterologist to see if she needs stretching again -Reviewed the latest thyroid ultrasound from 09/08/2021: Stable, 1.5 cm left mid thyroid nodule with stable heterogeneity and atrophy of the rest of the gland.  Reviewing Dr. Cordelia Pen note, this nodule was previously biopsied. -She also has a small, stable, 6 mm right mid thyroid nodule for which no follow-up is needed  Needs refills in 06/2022.  Component     Latest Ref Rng 04/28/2022  Hemoglobin A1C     4.0 - 5.6 % 5.6   TSH     0.35 - 5.50 uIU/mL 4.66   Cholesterol     0 - 200 mg/dL 159   Triglycerides     0.0 - 149.0 mg/dL 132.0   HDL Cholesterol     >39.00 mg/dL 73.10   VLDL     0.0 - 40.0 mg/dL 26.4   LDL (calc)     0 - 99 mg/dL 60   Total CHOL/HDL Ratio 2   NonHDL 86.23   T4,Free(Direct)     0.60 - 1.60 ng/dL 0.97    All Labs are normal.  We will recheck her TFTs after she separates iron from levothyroxine.  Philemon Kingdom, MD PhD [facility]  Endocrinology

## 2022-04-28 ENCOUNTER — Encounter: Payer: Self-pay | Admitting: Internal Medicine

## 2022-04-28 ENCOUNTER — Ambulatory Visit (INDEPENDENT_AMBULATORY_CARE_PROVIDER_SITE_OTHER): Payer: Medicare Other | Admitting: Internal Medicine

## 2022-04-28 VITALS — BP 128/82 | HR 87 | Ht 65.0 in | Wt 208.4 lb

## 2022-04-28 DIAGNOSIS — E890 Postprocedural hypothyroidism: Secondary | ICD-10-CM

## 2022-04-28 DIAGNOSIS — E042 Nontoxic multinodular goiter: Secondary | ICD-10-CM | POA: Diagnosis not present

## 2022-04-28 DIAGNOSIS — E1169 Type 2 diabetes mellitus with other specified complication: Secondary | ICD-10-CM

## 2022-04-28 LAB — TSH: TSH: 4.66 u[IU]/mL (ref 0.35–5.50)

## 2022-04-28 LAB — LIPID PANEL
Cholesterol: 159 mg/dL (ref 0–200)
HDL: 73.1 mg/dL (ref >39.00)
LDL Cholesterol: 60 mg/dL (ref 0–99)
NonHDL: 86.23
Total CHOL/HDL Ratio: 2
Triglycerides: 132 mg/dL (ref 0.0–149.0)
VLDL: 26.4 mg/dL (ref 0.0–40.0)

## 2022-04-28 LAB — POCT GLYCOSYLATED HEMOGLOBIN (HGB A1C): Hemoglobin A1C: 5.6 % (ref 4.0–5.6)

## 2022-04-28 LAB — T4, FREE: Free T4: 0.97 ng/dL (ref 0.60–1.60)

## 2022-04-28 NOTE — Patient Instructions (Addendum)
Please continue Levothyroxine 175 mcg daily.  Take the thyroid hormone every day, with water, at least 30 minutes before breakfast, separated by at least 4 hours from: - acid reflux medications - calcium - iron - multivitamins  Move the Iron to before lunch.  Please stop at the lab.  Please return in 6 months with your sugar log.   PATIENT INSTRUCTIONS FOR TYPE 2 DIABETES:  **Please join MyChart!** - see attached instructions about how to join if you have not done so already.  DIET AND EXERCISE Diet and exercise is an important part of diabetic treatment.  We recommended aerobic exercise in the form of brisk walking (working between 40-60% of maximal aerobic capacity, similar to brisk walking) for 150 minutes per week (such as 30 minutes five days per week) along with 3 times per week performing 'resistance' training (using various gauge rubber tubes with handles) 5-10 exercises involving the major muscle groups (upper body, lower body and core) performing 10-15 repetitions (or near fatigue) each exercise. Start at half the above goal but build slowly to reach the above goals. If limited by weight, joint pain, or disability, we recommend daily walking in a swimming pool with water up to waist to reduce pressure from joints while allow for adequate exercise.    BLOOD GLUCOSES Monitoring your blood glucoses is important for continued management of your diabetes. Please check your blood glucoses 2-4 times a day: fasting, before meals and at bedtime (you can rotate these measurements - e.g. one day check before the 3 meals, the next day check before 2 of the meals and before bedtime, etc.).   HYPOGLYCEMIA (low blood sugar) Hypoglycemia is usually a reaction to not eating, exercising, or taking too much insulin/ other diabetes drugs.  Symptoms include tremors, sweating, hunger, confusion, headache, etc. Treat IMMEDIATELY with 15 grams of Carbs: 4 glucose tablets  cup regular juice/soda 2  tablespoons raisins 4 teaspoons sugar 1 tablespoon honey Recheck blood glucose in 15 mins and repeat above if still symptomatic/blood glucose <100.  RECOMMENDATIONS TO REDUCE YOUR RISK OF DIABETIC COMPLICATIONS: * Take your prescribed MEDICATION(S) * Follow a DIABETIC diet: Complex carbs, fiber rich foods, (monounsaturated and polyunsaturated) fats * AVOID saturated/trans fats, high fat foods, >2,300 mg salt per day. * EXERCISE at least 5 times a week for 30 minutes or preferably daily.  * DO NOT SMOKE OR DRINK more than 1 drink a day. * Check your FEET every day. Do not wear tightfitting shoes. Contact us if you develop an ulcer * See your EYE doctor once a year or more if needed * Get a FLU shot once a year * Get a PNEUMONIA vaccine once before and once after age 42 years  GOALS:  * Your Hemoglobin A1c of <7%  * fasting sugars need to be 80-130 * after meals sugars need to be <180 (2h after you start eating) * Your Systolic BP should be 161 or lower  * Your Diastolic BP should be 80 or lower  * Your HDL (Good Cholesterol) should be 40 or higher  * Your LDL (Bad Cholesterol) should be ideally <70. * Your Triglycerides should be 150 or lower  * Your Urine microalbumin (kidney function) should be <30 * Your Body Mass Index should be 25 or lower   Please consider the following ways to cut down carbs and fat and increase fiber and micronutrients in your diet: - substitute whole grain for white bread or pasta - substitute brown rice for white  rice - substitute 90-calorie flat bread pieces for slices of bread when possible - substitute sweet potatoes or yams for white potatoes - substitute humus for margarine - substitute tofu for cheese when possible - substitute almond or rice milk for regular milk (would not drink soy milk daily due to concern for soy estrogen influence on breast cancer risk) - substitute dark chocolate for other sweets when possible - substitute water - can add  lemon or orange slices for taste - for diet sodas (artificial sweeteners will trick your body that you can eat sweets without getting calories and will lead you to overeating and weight gain in the long run) - do not skip breakfast or other meals (this will slow down the metabolism and will result in more weight gain over time)  - can try smoothies made from fruit and almond/rice milk in am instead of regular breakfast - can also try old-fashioned (not instant) oatmeal made with almond/rice milk in am - order the dressing on the side when eating salad at a restaurant (pour less than half of the dressing on the salad) - eat as little meat as possible - can try juicing, but should not forget that juicing will get rid of the fiber, so would alternate with eating raw veg./fruits or drinking smoothies - use as little oil as possible, even when using olive oil - can dress a salad with a mix of balsamic vinegar and lemon juice, for e.g. - use agave nectar, stevia sugar, or regular sugar rather than artificial sweateners - steam or broil/roast veggies  - snack on veggies/fruit/nuts (unsalted, preferably) when possible, rather than processed foods - reduce or eliminate aspartame in diet (it is in diet sodas, chewing gum, etc) Read the labels!  Try to read Dr. Janene Harvey book: "Program for Reversing Diabetes" for other ideas for healthy eating.

## 2022-05-04 ENCOUNTER — Ambulatory Visit (INDEPENDENT_AMBULATORY_CARE_PROVIDER_SITE_OTHER): Payer: Medicare Other | Admitting: Internal Medicine

## 2022-05-04 ENCOUNTER — Ambulatory Visit (INDEPENDENT_AMBULATORY_CARE_PROVIDER_SITE_OTHER): Payer: Medicare Other

## 2022-05-04 VITALS — BP 124/76 | HR 80 | Temp 97.9°F | Ht 65.0 in | Wt 206.0 lb

## 2022-05-04 DIAGNOSIS — E559 Vitamin D deficiency, unspecified: Secondary | ICD-10-CM

## 2022-05-04 DIAGNOSIS — I10 Essential (primary) hypertension: Secondary | ICD-10-CM | POA: Diagnosis not present

## 2022-05-04 DIAGNOSIS — R0602 Shortness of breath: Secondary | ICD-10-CM | POA: Diagnosis not present

## 2022-05-04 DIAGNOSIS — R062 Wheezing: Secondary | ICD-10-CM

## 2022-05-04 DIAGNOSIS — R059 Cough, unspecified: Secondary | ICD-10-CM | POA: Diagnosis not present

## 2022-05-04 DIAGNOSIS — R0989 Other specified symptoms and signs involving the circulatory and respiratory systems: Secondary | ICD-10-CM | POA: Diagnosis not present

## 2022-05-04 DIAGNOSIS — R051 Acute cough: Secondary | ICD-10-CM

## 2022-05-04 LAB — POC INFLUENZA A&B (BINAX/QUICKVUE)
Influenza A, POC: NEGATIVE
Influenza B, POC: NEGATIVE

## 2022-05-04 LAB — POCT RESPIRATORY SYNCYTIAL VIRUS: RSV Rapid Ag: NEGATIVE

## 2022-05-04 LAB — POC SOFIA SARS ANTIGEN FIA: SARS Coronavirus 2 Ag: NEGATIVE

## 2022-05-04 MED ORDER — LEVOFLOXACIN 500 MG PO TABS: 500.0000 mg | ORAL_TABLET | Freq: Every day | ORAL | 0 refills | Status: AC

## 2022-05-04 MED ORDER — PREDNISONE 10 MG PO TABS: ORAL_TABLET | ORAL | 0 refills | Status: DC

## 2022-05-04 MED ORDER — HYDROCODONE BIT-HOMATROP MBR 5-1.5 MG/5ML PO SOLN: 5.0000 mL | Freq: Four times a day (QID) | ORAL | 0 refills | Status: AC | PRN

## 2022-05-04 NOTE — Progress Notes (Unsigned)
Patient ID: [name], female   DOB: 05/04/1946, 76 y.o.   MRN: [id number]        Chief Complaint: follow up cough,, wheezing with sob       HPI:  [name] is a 76 y.o. female here with c/o        Wt Readings from Last 3 Encounters:  05/04/22 206 lb (93.4 kg)  04/28/22 208 lb 6.4 oz (94.5 kg)  04/15/22 203 lb (92.1 kg)   BP Readings from Last 3 Encounters:  05/04/22 124/76  04/28/22 128/82  04/15/22 (!) 145/88         Past Medical History:  Diagnosis Date   Allergy    ANEMIA, IRON DEFICIENCY 05/08/2009   Angina    ASYMPTOMATIC POSTMENOPAUSAL STATUS 10/11/2008   Blood transfusion    Blood transfusion without reported diagnosis    Breast cancer ([place]) 09/29/2011   invasive grade III ductal ca,assoc high grade dcis,ER/PR=neg; left breast   C. difficile colitis    Cataract    Diverticulosis of colon (without mention of hemorrhage)    Esophageal reflux 06/12/2008   Gastroparesis    GOITER, MULTINODULAR 04/02/2009   Gout, unspecified    H/O hiatal hernia    History of kidney stones    History of lower GI bleeding    History of radiation therapy 02/08/12-03/25/12   left breast,total 61gy   Hypokalemia 05/11/2013   Hypomagnesemia    HYPOTHYROIDISM, POST-RADIATION 08/13/2009   Internal hemorrhoids without mention of complication    Kidney stones    "several"   Leukopenia    Migraines    Obesity    On supplemental oxygen therapy    at night while sleeping - patient did not want to undergo sleep study   Osteoarthrosis, unspecified whether generalized or localized, unspecified site    Other and unspecified hyperlipidemia    Personal history of chemotherapy 2013   Personal history of radiation therapy 2013   left   PONV (postoperative nausea and vomiting)    PUD (peptic ulcer disease)    Short bowel syndrome    Shortness of breath on exertion    "sometimes"   Stricture and stenosis of esophagus    Thyrotoxicosis without mention of goiter or other  cause, without mention of thyrotoxic crisis or storm    Type II or unspecified type diabetes mellitus without mention of complication, not stated as uncontrolled    no med in years diet controled   Unspecified essential hypertension    UTI (urinary tract infection)    Varicose veins    VITAMIN B12 DEFICIENCY 08/30/2009   Past Surgical History:  Procedure Laterality Date   ABDOMINAL ADHESION SURGERY  1980's thru 1990's   "several"   ABDOMINAL HYSTERECTOMY  1970's   with BSO   BREAST BIOPSY Left 08/13/11   left breast lower inner quadrant   BREAST BIOPSY Right 1985   Rt exc bx, benign   BREAST LUMPECTOMY Left 08/2011   BREAST LUMPECTOMY W/ NEEDLE LOCALIZATION  09/29/11   left  breast=lymph node,excision benign/ ER/PR=neg, her 2 Positive   BREAST REDUCTION SURGERY Bilateral 06/09/2021   Procedure: MAMMARY REDUCTION  (BREAST);  Surgeon: Cindra Presume, MD;  Location: [place];  Service: Plastics;  Laterality: Bilateral;  2 hours   BUNIONECTOMY  1970's   bilateral   CHOLECYSTECTOMY  1990's   COLON SURGERY     "several surgeries for short bowel syndrome"   COLONOSCOPY  2012   multiple  DILATION AND CURETTAGE OF UTERUS     ESOPHAGOGASTRODUODENOSCOPY  2011   multiple    EXCISIONAL HEMORRHOIDECTOMY  11/10/2016   EYE SURGERY     "long time ago"   FLEXIBLE SIGMOIDOSCOPY  2011   multiple    KIDNEY STONE SURGERY  1990's   "tried to go up & get it but pushed it further up"   LITHOTRIPSY     "4 or 5 times"   MASTECTOMY W/ NODES PARTIAL  09/29/11   left   PORT-A-CATH REMOVAL Right 12/19/2013   Procedure: MINOR REMOVAL PORT-A-CATH;  Surgeon: Adin Hector, MD;  Location: [place] [facility];  Service: General;  Laterality: Right;   PORTACATH PLACEMENT  09/29/2011   Procedure: INSERTION PORT-A-CATH;  Surgeon: Adin Hector, MD;  Location: [place];  Service: General;  Laterality: N/A;   SMALL INTESTINE SURGERY     Thyroid Ultrasound  12/1994 and 12/1995   TOTAL KNEE ARTHROPLASTY Right  06/05/2015   Procedure: RIGHT TOTAL KNEE ARTHROPLASTY;  Surgeon: Ninetta Lights, MD;  Location: [facility];  Service: Orthopedics;  Laterality: Right;   UPPER GASTROINTESTINAL ENDOSCOPY     VEIN LIGATION AND STRIPPING  1980's   Right leg    reports that she quit smoking about 36 years ago. Her smoking use included cigarettes. She has a 10.00 pack-year smoking history. She has never used smokeless tobacco. She reports that she does not drink alcohol and does not use drugs. family history includes Breast cancer in her cousin, maternal aunt, paternal aunt, and son; Colon cancer in her paternal uncle; Diabetes in her father and mother; Esophageal cancer in her son; Heart disease in her mother; Hypertension in her father; Kidney disease in her brother and sister. Allergies  Allergen Reactions   Aspirin Other (See Comments)    REACTION: Gi Intolerance/ Burning in stomach   Trazodone And Nefazodone Nausea And Vomiting    "sick"   Flagyl [Metronidazole] Rash   Morphine And Related Itching   Current Outpatient Medications on File Prior to Visit  Medication Sig Dispense Refill   albuterol (VENTOLIN HFA) 108 (90 Base) MCG/ACT inhaler Inhale 2 puffs into the lungs every 6 (six) hours as needed for wheezing or shortness of breath. 54 g 4   allopurinol (ZYLOPRIM) 100 MG tablet Take 2 tablets (200 mg total) by mouth daily. 180 tablet 3   amitriptyline (ELAVIL) 50 MG tablet TAKE ONE TABLET BY MOUTH EVERY DAY AT BEDTIME (USE CAUTION - MAY CAUSE DROWSINESS) 90 tablet 3   calcium carbonate (TUMS - DOSED IN MG ELEMENTAL CALCIUM) 500 MG chewable tablet Chew 1 tablet by mouth daily as needed for indigestion or heartburn.     Calcium Carbonate-Vitamin D (CALCIUM-VITAMIN D) 600-200 MG-UNIT CAPS Take 1 capsule by mouth daily.     cholecalciferol (VITAMIN D) 25 MCG (1000 UNIT) tablet Take 1,000 Units by mouth daily.     colchicine 0.6 MG tablet Take 1 tablet (0.6 mg total) by mouth daily as needed (gout or psuedogout  pain). 90 tablet 2   Cyanocobalamin (VITAMIN B 12 PO) Take 1,000 mcg by mouth daily.     dicyclomine (BENTYL) 20 MG tablet Take 1 tablet (20 mg total) by mouth 3 (three) times daily before meals. 270 tablet 11   famotidine (PEPCID) 40 MG tablet Take 1 tablet (40 mg total) by mouth daily. 90 tablet 3   ferrous sulfate 325 (65 FE) MG tablet Take 1 tablet (325 mg total) by mouth daily with breakfast. 90 tablet  0   fluticasone (FLONASE) 50 MCG/ACT nasal spray Place 2 sprays into both nostrils daily. 48 g 3   HYDROcodone-acetaminophen (NORCO/VICODIN) 5-325 MG tablet Take 1 tablet by mouth every 6 (six) hours as needed. 15 tablet 0   levothyroxine (SYNTHROID) 175 MCG tablet TAKE ONE TABLET BY MOUTH EVERY DAY BEFORE BREAKFAST. 90 tablet 0   loratadine (CLARITIN) 10 MG tablet Take 1 tablet (10 mg total) by mouth daily. 90 tablet 1   lovastatin (MEVACOR) 20 MG tablet TAKE ONE TABLET BY MOUTH EVERY DAY AT 6:00PM 90 tablet 3   magnesium oxide (MAG-OX) 400 MG tablet Take 1 tablet (400 mg total) by mouth 2 (two) times daily. 180 tablet 1   meloxicam (MOBIC) 15 MG tablet Take 1 tablet (15 mg total) by mouth daily. 30 tablet 1   nitroGLYCERIN (NITROSTAT) 0.4 MG SL tablet Place 1 tablet (0.4 mg total) under the tongue every 5 (five) minutes as needed. 25 tablet 6   ondansetron (ZOFRAN) 4 MG tablet Take 1 tablet (4 mg total) by mouth every 8 (eight) hours as needed for nausea or vomiting. 20 tablet 0   pantoprazole (PROTONIX) 40 MG tablet TAKE 1 TABLET BY MOUTH 30 MINUTES PRIOR TO BREAKFAST AND SUPPER 180 tablet 1   potassium chloride SA (KLOR-CON M) 20 MEQ tablet Take 1 tablet (20 mEq total) by mouth daily. 90 tablet 3   pregabalin (LYRICA) 75 MG capsule Take 1 capsule (75 mg total) by mouth 2 (two) times daily as needed (nerve pain). 180 capsule 3   No current facility-administered medications on file prior to visit.        ROS:  All others reviewed and negative.  Objective        PE:  BP 124/76 (BP  Location: Right Arm, Patient Position: Sitting, Cuff Size: Large)   Pulse 80   Temp 97.9 F (36.6 C) (Oral)   Ht '5\' 5"'$  (1.651 m)   Wt 206 lb (93.4 kg)   SpO2 95%   BMI 34.28 kg/m                 Constitutional: Pt appears in NAD               HENT: Head: NCAT.                Right Ear: External ear normal.                 Left Ear: External ear normal.                Eyes: . Pupils are equal, round, and reactive to light. Conjunctivae and EOM are normal               Nose: without d/c or deformity               Neck: Neck supple. Gross normal ROM               Cardiovascular: Normal rate and regular rhythm.                 Pulmonary/Chest: Effort normal and breath sounds without rales or wheezing.                Abd:  Soft, NT, ND, + BS, no organomegaly               Neurological: Pt is alert. At baseline orientation, motor grossly intact  Skin: Skin is warm. No rashes, no other new lesions, LE edema - ***               Psychiatric: Pt behavior is normal without agitation   Micro: none  Cardiac tracings I have personally interpreted today:  none  Pertinent Radiological findings (summarize): none   Lab Results  Component Value Date   WBC 4.3 09/25/2021   HGB 9.2 (L) 09/25/2021   HCT 28.8 (L) 09/25/2021   PLT 221.0 09/25/2021   GLUCOSE 94 09/25/2021   CHOL 159 04/28/2022   TRIG 132.0 04/28/2022   HDL 73.10 04/28/2022   LDLDIRECT 53.0 10/29/2015   LDLCALC 60 04/28/2022   ALT 19 09/25/2021   AST 31 09/25/2021   NA 142 09/25/2021   K 3.3 (L) 09/25/2021   CL 101 09/25/2021   CREATININE 0.99 09/25/2021   BUN 17 09/25/2021   CO2 33 (H) 09/25/2021   TSH 4.66 04/28/2022   INR 0.95 05/24/2015   HGBA1C 5.6 04/28/2022   MICROALBUR <0.7 12/13/2020   POCT today - COVID neg, Flu A/B neg,m RSV negative  Assessment/Plan:  [name] is a 76 y.o. Black or African American [2] female with  has a past medical history of Allergy, ANEMIA, IRON DEFICIENCY  (05/08/2009), Angina, ASYMPTOMATIC POSTMENOPAUSAL STATUS (10/11/2008), Blood transfusion, Blood transfusion without reported diagnosis, Breast cancer ([place]) (09/29/2011), C. difficile colitis, Cataract, Diverticulosis of colon (without mention of hemorrhage), Esophageal reflux (06/12/2008), Gastroparesis, GOITER, MULTINODULAR (04/02/2009), Gout, unspecified, H/O hiatal hernia, History of kidney stones, History of lower GI bleeding, History of radiation therapy (02/08/12-03/25/12), Hypokalemia (05/11/2013), Hypomagnesemia, HYPOTHYROIDISM, POST-RADIATION (08/13/2009), Internal hemorrhoids without mention of complication, Kidney stones, Leukopenia, Migraines, Obesity, On supplemental oxygen therapy, Osteoarthrosis, unspecified whether generalized or localized, unspecified site, Other and unspecified hyperlipidemia, Personal history of chemotherapy (2013), Personal history of radiation therapy (2013), PONV (postoperative nausea and vomiting), PUD (peptic ulcer disease), Short bowel syndrome, Shortness of breath on exertion, Stricture and stenosis of esophagus, Thyrotoxicosis without mention of goiter or other cause, without mention of thyrotoxic crisis or storm, Type II or unspecified type diabetes mellitus without mention of complication, not stated as uncontrolled, Unspecified essential hypertension, UTI (urinary tract infection), Varicose veins, and VITAMIN B12 DEFICIENCY (08/30/2009).  No problem-specific Assessment & Plan notes found for this encounter.  Followup: No follow-ups on file.  Cathlean Cower, MD 05/04/2022 1:55 PM [facility]

## 2022-05-04 NOTE — Patient Instructions (Signed)
Your COVID, flu, and RSV testing is negative  Please take all new medication as prescribed - the antibiotic, cough medicine as needed, prednisone and continue your home inhaler as needed  Please continue all other medications as before, and refills have been done if requested.  Please have the pharmacy call with any other refills you may need.  Please keep your appointments with your specialists as you may have planned  Please go to the XRAY Department in the first floor for the x-ray testing  You will be contacted by phone if any changes need to be made immediately.  Otherwise, you will receive a letter about your results with an explanation, but please check with MyChart first.  Please remember to sign up for MyChart if you have not done so, as this will be important to you in the future with finding out test results, communicating by private email, and scheduling acute appointments online when needed.

## 2022-05-05 ENCOUNTER — Encounter: Payer: Self-pay | Admitting: Internal Medicine

## 2022-05-05 NOTE — Assessment & Plan Note (Signed)
BP Readings from Last 3 Encounters:  05/04/22 124/76  04/28/22 128/82  04/15/22 (!) 145/88   Stable, pt to continue medical treatment - low satl, wt control, diet

## 2022-05-05 NOTE — Assessment & Plan Note (Signed)
Mild, for prednisone taper,  to f/u any worsening symptoms or concerns

## 2022-05-05 NOTE — Assessment & Plan Note (Signed)
Last vitamin D Lab Results  Component Value Date   VD25OH 25.97 (L) 08/22/2021   Low, reminded to start oral replacement

## 2022-05-05 NOTE — Assessment & Plan Note (Signed)
1 wk onset likely infectious, c/w bronchitis vs pna, for cxr, also empiric tx with levaquin 500 mg course, cough med prn

## 2022-06-02 ENCOUNTER — Other Ambulatory Visit: Payer: Self-pay | Admitting: Internal Medicine

## 2022-06-02 ENCOUNTER — Ambulatory Visit (INDEPENDENT_AMBULATORY_CARE_PROVIDER_SITE_OTHER): Payer: Medicare Other | Admitting: Internal Medicine

## 2022-06-02 ENCOUNTER — Encounter: Payer: Self-pay | Admitting: Internal Medicine

## 2022-06-02 VITALS — BP 118/68 | HR 95 | Temp 97.8°F | Ht 65.0 in | Wt 209.0 lb

## 2022-06-02 DIAGNOSIS — R911 Solitary pulmonary nodule: Secondary | ICD-10-CM

## 2022-06-02 DIAGNOSIS — I10 Essential (primary) hypertension: Secondary | ICD-10-CM

## 2022-06-02 DIAGNOSIS — D500 Iron deficiency anemia secondary to blood loss (chronic): Secondary | ICD-10-CM | POA: Diagnosis not present

## 2022-06-02 DIAGNOSIS — E876 Hypokalemia: Secondary | ICD-10-CM

## 2022-06-02 DIAGNOSIS — M1A9XX0 Chronic gout, unspecified, without tophus (tophi): Secondary | ICD-10-CM | POA: Diagnosis not present

## 2022-06-02 DIAGNOSIS — H10403 Unspecified chronic conjunctivitis, bilateral: Secondary | ICD-10-CM | POA: Diagnosis not present

## 2022-06-02 DIAGNOSIS — H0102A Squamous blepharitis right eye, upper and lower eyelids: Secondary | ICD-10-CM | POA: Diagnosis not present

## 2022-06-02 DIAGNOSIS — H0102B Squamous blepharitis left eye, upper and lower eyelids: Secondary | ICD-10-CM | POA: Diagnosis not present

## 2022-06-02 DIAGNOSIS — E890 Postprocedural hypothyroidism: Secondary | ICD-10-CM

## 2022-06-02 LAB — MICROALBUMIN / CREATININE URINE RATIO
Creatinine,U: 68.8 mg/dL
Microalb Creat Ratio: 1 mg/g (ref 0.0–30.0)
Microalb, Ur: <0.7 mg/dL (ref 0.0–1.9)

## 2022-06-02 LAB — COMPREHENSIVE METABOLIC PANEL
ALT: 21 U/L (ref 0–35)
AST: 34 U/L (ref 0–37)
Albumin: 3.7 g/dL (ref 3.5–5.2)
Alkaline Phosphatase: 67 U/L (ref 39–117)
BUN: 15 mg/dL (ref 6–23)
CO2: 29 mEq/L (ref 19–32)
Calcium: 7.2 mg/dL — ABNORMAL LOW (ref 8.4–10.5)
Chloride: 101 mEq/L (ref 96–112)
Creatinine, Ser: 1.06 mg/dL (ref 0.40–1.20)
GFR: 50.44 mL/min — ABNORMAL LOW (ref >60.00)
Glucose, Bld: 109 mg/dL — ABNORMAL HIGH (ref 70–99)
Potassium: 3.3 mEq/L — ABNORMAL LOW (ref 3.5–5.1)
Sodium: 143 mEq/L (ref 135–145)
Total Bilirubin: 0.8 mg/dL (ref 0.2–1.2)
Total Protein: 7.2 g/dL (ref 6.0–8.3)

## 2022-06-02 LAB — CBC
HCT: 28.4 % — ABNORMAL LOW (ref 36.0–46.0)
Hemoglobin: 9.4 g/dL — ABNORMAL LOW (ref 12.0–15.0)
MCHC: 33 g/dL (ref 30.0–36.0)
MCV: 89.8 fl (ref 78.0–100.0)
Platelets: 268 10*3/uL (ref 150.0–400.0)
RBC: 3.16 Mil/uL — ABNORMAL LOW (ref 3.87–5.11)
RDW: 13.8 % (ref 11.5–15.5)
WBC: 5 10*3/uL (ref 4.0–10.5)

## 2022-06-02 LAB — MAGNESIUM: Magnesium: 0.6 mg/dL — CL (ref 1.5–2.5)

## 2022-06-02 LAB — URIC ACID: Uric Acid, Serum: 6.9 mg/dL (ref 2.4–7.0)

## 2022-06-02 LAB — TSH: TSH: 4.9 u[IU]/mL (ref 0.35–5.50)

## 2022-06-02 MED ORDER — ALBUTEROL SULFATE HFA 108 (90 BASE) MCG/ACT IN AERS: 2.0000 | INHALATION_SPRAY | Freq: Four times a day (QID) | RESPIRATORY_TRACT | 4 refills | Status: DC | PRN

## 2022-06-02 NOTE — Patient Instructions (Signed)
We will check the labs today. 

## 2022-06-02 NOTE — Progress Notes (Signed)
   Subjective:   Patient ID: [name], female    DOB: [birth year], 77 y.o.   MRN: [id number]  HPI The patient is a 77 YO female coming in for tingling in right arm and leg. Feels similar to when her potassium and magnesium were low. She remains on 20 mEq potassium daily.   Review of Systems  Constitutional: Negative.   HENT: Negative.    Eyes: Negative.   Respiratory:  Negative for cough, chest tightness and shortness of breath.   Cardiovascular:  Negative for chest pain, palpitations and leg swelling.  Gastrointestinal:  Negative for abdominal distention, abdominal pain, constipation, diarrhea, nausea and vomiting.  Musculoskeletal: Negative.   Skin: Negative.   Neurological:  Positive for numbness.  Psychiatric/Behavioral: Negative.      Objective:  Physical Exam Constitutional:      Appearance: She is well-developed.  HENT:     Head: Normocephalic and atraumatic.  Cardiovascular:     Rate and Rhythm: Normal rate and regular rhythm.  Pulmonary:     Effort: Pulmonary effort is normal. No respiratory distress.     Breath sounds: Normal breath sounds. No wheezing or rales.  Abdominal:     General: Bowel sounds are normal. There is no distension.     Palpations: Abdomen is soft.     Tenderness: There is no abdominal tenderness. There is no rebound.  Musculoskeletal:     Cervical back: Normal range of motion.  Skin:    General: Skin is warm and dry.  Neurological:     Mental Status: She is alert and oriented to person, place, and time.     Coordination: Coordination normal.     Vitals:   06/02/22 1450  BP: 118/68  Pulse: 95  Temp: 97.8 F (36.6 C)  TempSrc: Oral  SpO2: 97%  Weight: 209 lb (94.8 kg)  Height: 5\' 5"  (1.651 m)    Assessment & Plan:

## 2022-06-03 ENCOUNTER — Other Ambulatory Visit: Payer: Self-pay | Admitting: Internal Medicine

## 2022-06-03 ENCOUNTER — Encounter: Payer: Self-pay | Admitting: Internal Medicine

## 2022-06-03 DIAGNOSIS — R911 Solitary pulmonary nodule: Secondary | ICD-10-CM | POA: Insufficient documentation

## 2022-06-03 MED ORDER — MAGNESIUM OXIDE 400 MG PO TABS: 400.0000 mg | ORAL_TABLET | Freq: Two times a day (BID) | ORAL | 1 refills | Status: DC

## 2022-06-03 NOTE — Assessment & Plan Note (Signed)
Checking CMP and adjust as needed. BP at goal on no meds currently.

## 2022-06-03 NOTE — Assessment & Plan Note (Signed)
Checking CBC for stability.  

## 2022-06-03 NOTE — Assessment & Plan Note (Signed)
Checking uric acid for goal <6. Adjust allopurinol 200 mg daily as needed.

## 2022-06-03 NOTE — Assessment & Plan Note (Signed)
Checking CMP for recurrence she is having similar symptoms to previous episode. She is taking 20 mEq daily potassium currently.

## 2022-06-03 NOTE — Assessment & Plan Note (Signed)
Nodule noted on CXR recently. Suspect this was reactive lymph node as it was perihilar and she had infection at the time. Ordered CT chest for follow up.

## 2022-06-03 NOTE — Assessment & Plan Note (Signed)
Checking TSH, adjust as needed. Taking synthroid 175 mcg daily.

## 2022-06-03 NOTE — Assessment & Plan Note (Signed)
Possible recurrence with new tingling in arms and legs. Checking magnesium level not taking supplementation currently.

## 2022-06-10 ENCOUNTER — Other Ambulatory Visit: Payer: Medicare Other

## 2022-06-23 ENCOUNTER — Ambulatory Visit: Payer: Self-pay

## 2022-06-23 ENCOUNTER — Ambulatory Visit (INDEPENDENT_AMBULATORY_CARE_PROVIDER_SITE_OTHER): Payer: Medicare Other | Admitting: Family Medicine

## 2022-06-23 VITALS — BP 114/72 | HR 83 | Ht 65.0 in | Wt 215.0 lb

## 2022-06-23 DIAGNOSIS — G8929 Other chronic pain: Secondary | ICD-10-CM

## 2022-06-23 DIAGNOSIS — M79671 Pain in right foot: Secondary | ICD-10-CM

## 2022-06-23 DIAGNOSIS — M7661 Achilles tendinitis, right leg: Secondary | ICD-10-CM | POA: Insufficient documentation

## 2022-06-23 MED ORDER — TRAMADOL HCL 50 MG PO TABS: 50.0000 mg | ORAL_TABLET | Freq: Three times a day (TID) | ORAL | 0 refills | Status: DC | PRN

## 2022-06-23 NOTE — Patient Instructions (Addendum)
Thank you for coming in today.   Please use Voltaren gel (Generic Diclofenac Gel) up to 4x daily for pain as needed.  This is available over-the-counter as both the name brand Voltaren gel and the generic diclofenac gel.   If you hurt a lot ok to use a cam walker boot.   Please complete the exercises that the athletic trainer went over with you:  View at [email or web address] using code: [email or web address]  Check back in 1 month

## 2022-06-23 NOTE — Progress Notes (Signed)
I, [name], LAT, ATC acting as a scribe for Rachel Leader, MD.  [name] is a 77 y.o. female who presents to [facility] today for R heel pain. Pt was previously seen by Dr. Georgina Snell on 09/29/21 for a gout flare in her L wrist. Today, pt c/o R heel pain x over 1 month. Pt was seen by Dr. Amalia Hailey in podiatry on 03/02/22 for R Achilles tendonopathy. Pt locates pain to the posterior aspect of her R calcaneous to along the R Achilles tendon.  Swelling: yes Aggravates: worst 1st thing in the morning, PF, walking Treatments tried: colchicine, Tylenol, naproxen  Dx imaging: 02/03/22 R foot XR  Pertinent review of systems: No fevers or chills.  Relevant historical information: CKD 3 with GFR of 50.  Intolerant to hydrocodone.   Exam:  BP 114/72   Pulse 83   Ht '5\' 5"'$  (1.651 m)   Wt 215 lb (97.5 kg)   SpO2 96%   BMI 35.78 kg/m  General: Well Developed, well nourished, and in no acute distress.   MSK: Right foot some swelling right lower leg. Tender to palpation posterior calcaneus.  Normal foot and ankle motion and strength.  Some pain with resisted foot plantarflexion. Pulses capillary fill and sensation are intact distally.    Lab and Radiology Results  Diagnostic Limited MSK Ultrasound of: Right distal Achilles tendon Achilles tendon is intact but thick at tendons insertion site with calcific change at distal tendon insertion site consistent with chronic calcific tendinopathy. On Doppler increased vascular activity within the tendon and superficial to the tendon. No definitive tendon tears are present. Superficial to the tendon there is some hypoechoic fluid tracking consistent with edema or peritendinitis. Impression: Right chronic calcific Achilles tendinitis.      Assessment and Plan: 77 y.o. female with right Achilles tendinitis.  On ultrasound appears to be chronic and calcific type.  Based on chart review this has been ongoing  since at least September.  Podiatry treated with a cam walker boot and course of prednisone.  This is a good short-term solution but I fear will provide lasting benefit.  Will try to treat with eccentric exercises using band based exercise for now and topical Voltaren gel.  Recheck in 1 month.  Hopefully at that point we can move to weight-based eccentric exercises.  For pain control prescribed tramadol.  If she needs to she can go to the cam walker boot if needed  We could consider shockwave therapy but this will not be covered by her insurance and would be pretty expensive.  A typical 4 session course would cost a little over $200.  There is some evidence that this may be a superior treatment than typical eccentric exercises for chronic Achilles tendinitis.  Recheck 1 month.   PDMP reviewed during this encounter. Orders Placed This Encounter  Procedures   [place] LIMITED JOINT SPACE STRUCTURES LOW RIGHT(NO LINKED CHARGES)    Order Specific Question:   Reason for Exam (SYMPTOM  OR DIAGNOSIS REQUIRED)    Answer:   right heel pain    Order Specific Question:   Preferred imaging location?    Answer:   [facility]   Meds ordered this encounter  Medications   traMADol (ULTRAM) 50 MG tablet    Sig: Take 1 tablet (50 mg total) by mouth every 8 (eight) hours as needed for severe pain.    Dispense:  15 tablet    Refill:  0  Discussed warning signs or symptoms. Please see discharge instructions. Patient expresses understanding.   The above documentation has been reviewed and is accurate and complete [name], M.D.

## 2022-07-02 ENCOUNTER — Ambulatory Visit
Admission: RE | Admit: 2022-07-02 | Discharge: 2022-07-02 | Disposition: A | Discharge status: Home / Self Care | Payer: Medicare Other | Source: Ambulatory Visit | Attending: Internal Medicine | Admitting: Internal Medicine

## 2022-07-02 DIAGNOSIS — J9811 Atelectasis: Secondary | ICD-10-CM | POA: Diagnosis not present

## 2022-07-02 DIAGNOSIS — I700 Atherosclerosis of aorta: Secondary | ICD-10-CM | POA: Diagnosis not present

## 2022-07-02 DIAGNOSIS — R911 Solitary pulmonary nodule: Secondary | ICD-10-CM

## 2022-07-19 NOTE — Progress Notes (Signed)
HeaHPI F former smoker ( 20 pk yr, quit 1980) referred for shortness of breath by Dr Acharya/ Cardiology for eval of Dyspnea and wheezing, complicated by HTN, Hiatal Hernia, GERD/ stricture, Gatroparesis, Hx C.diff colitis, Goiter/ Hypothyroid p RAI, DM2, Eczema, Breast Cancer L/ chemo/ XRT, FE def Anemia, Renal Insuficiency,  ECHO- nl w mild AR,  Negative ischemia w/u w non-obstructive CAD on CCTA,  PFT 03/22/20- minimal obstruction possible, FEV1/FVC 0.80, insignificant response to bronchodilator, mild Diffusion deficit Overnight Oximetry 03/07/20- O2 sat was 5% or more below avg for at least 5 cumulative minutes. (7 min 44 sec), Qualifying for sleep O2 Group 1 alternate. O2 2L sleep and portable/ [employer] ECHO- nl w mild AR,  PFT 03/22/20- minimal obstruction possible, FEV1/FVC 0.80, insignificant response to bronchodilator, mild Diffusion deficit ========================================================== 07/21/21- 75 yoF former smoker ( 20 pk yr, quit 1980) referred for shortness of breath by Dr Acharya/ Cardiology for eval of Dyspnea and wheezing, complicated by HTN, Hiatal Hernia, GERD/ stricture, Gastroparesis, Hx C.diff colitis, Goiter/ Hypothyroid p RAI, DM2, Eczema, Breast Cancer L/ chemo/ XRT, FE def Anemia, Renal Insuficiency, Aortic Atherosclerosis,  Covid vax- 4 Phizer Flu vax- had O2 2L sleep and portable/ [employer] ECHO- nl w mild AR,  PFT 03/22/20- minimal obstruction possible, FEV1/FVC 0.80, insignificant response to bronchodilator, mild Diffusion deficit Anemia chronic- Hgb 10.2.Nl indices - ProAirHFA, Flonase,      Semaglutide -----Patient feels good, no concerns Not using O2- ordered in 2021. Uses Proair 1-2x/ week for DOE on stairs. Not wheezing or coughing and no acute event. Followed by Cardiology because she says XRT might have hurt her heart. CXR 01/06/21- IMPRESSION: No acute abnormalities. Aortic Atherosclerosis (ICD10-I70.0).  07/21/22- 61 yoF former smoker ( 20 pk yr, quit  1980) followed  for Dyspnea and wheezing, complicated by HTN, Hiatal Hernia, GERD/ stricture, Gastroparesis, Hx C.diff colitis, Goiter/ Hypothyroid p RAI, DM2, Eczema, Breast Cancer L/ chemo/ XRT, FE def Anemia, Renal Insuficiency, Aortic Atherosclerosis,  Covid vax- 4 Phizer Flu vax- had - Ventolin hfa, O2 2 L Sleep/ [employer]- not using -----Pt states she is doing okay We discussed her dyspnea complaints.  There is some wheeze.  She agrees to try Anoro inhaler sample    ROS-see HPI   + = positive Constitutional:    weight loss, night sweats, fevers, chills, +fatigue, lassitude. HEENT:    headaches, difficulty swallowing, tooth/dental problems, sore throat,       sneezing, itching, ear ache, nasal congestion, post nasal drip, snoring CV:    chest pain, orthopnea, PND, +swelling in lower extremities, anasarca,                                   dizziness, palpitations Resp:  + shortness of breath with exertion or at rest.                productive cough,   non-productive cough, coughing up of blood.              change in color of mucus.  +wheezing.   Skin:    rash or lesions. GI:  + heartburn, indigestion, abdominal pain, nausea, vomiting, diarrhea,                 change in bowel habits, loss of appetite GU: dysuria, change in color of urine, no urgency or frequency.   flank pain. MS:   joint pain, stiffness, decreased range of motion, back  pain. Neuro-     nothing unusual Psych:  change in mood or affect.  depression or anxiety.   memory loss.  OBJ- Physical Exam General- Alert, Oriented, Affect-appropriate, Distress- none acute, + obese Skin- rash-none, lesions- none, excoriation- none Lymphadenopathy- none Head- atraumatic            Eyes- Gross vision intact, PERRLA, conjunctivae and secretions clear            Ears- Hearing, canals-normal            Nose- Clear, no-Septal dev, mucus, polyps, erosion, perforation             Throat- Mallampati III-IV , mucosa clear , drainage-  none, tonsils- atrophic, + teeth Neck- flexible , trachea midline, no stridor , thyroid nl, carotid no bruit Chest - symmetrical excursion , unlabored           Heart/CV- RRR , no murmur , no gallop  , no rub, nl s1 s2                           -JVD-none , edema-+1 feet, stasis changes- none, varices- none           Lung- clear to P&A, wheeze- none, cough- none , dullness-none, rub- none           Chest wall-  Abd-  Br/ Gen/ Rectal- Not done, not indicated Neuro- grossly intact to observation. +Question mild memory deficit.

## 2022-07-21 ENCOUNTER — Encounter: Payer: Self-pay | Admitting: Internal Medicine

## 2022-07-21 ENCOUNTER — Ambulatory Visit (INDEPENDENT_AMBULATORY_CARE_PROVIDER_SITE_OTHER): Payer: Medicare Other | Admitting: Internal Medicine

## 2022-07-21 VITALS — BP 130/78 | HR 86 | Ht 65.0 in | Wt 216.4 lb

## 2022-07-21 DIAGNOSIS — G4734 Idiopathic sleep related nonobstructive alveolar hypoventilation: Secondary | ICD-10-CM | POA: Diagnosis not present

## 2022-07-21 DIAGNOSIS — J4530 Mild persistent asthma, uncomplicated: Secondary | ICD-10-CM | POA: Diagnosis not present

## 2022-07-21 MED ORDER — UMECLIDINIUM-VILANTEROL 62.5-25 MCG/ACT IN AEPB: 1.0000 | INHALATION_SPRAY | Freq: Every day | RESPIRATORY_TRACT | 4 refills | Status: AC

## 2022-07-21 NOTE — Patient Instructions (Signed)
Script sent to add Anoro maintenance inhaler, 1 inhalation each morning   Ou can still use your albuterol rescue inhaler as needed

## 2022-07-27 NOTE — Progress Notes (Unsigned)
   Shirlyn Goltz, PhD, LAT, ATC acting as a scribe for [name], MD.  [name] is a 77 y.o. female who presents to [place] [facility] today for 1 month follow-up right heel pain thought to be due to chronic calcific Achilles tendonopathy. Pt was last seen by Dr. Georgina Snell on 06/23/22 and was taught HEP, prescribed tramadol, and advised to use Voltaren gel. Today, pt reports improvement of right heel sx. Denies new or worsening sx since last visit. Some discomfort with first morning steps, then improves throughout the day. Tramadol prn. Compliant with HEP.   Dx imaging: 02/03/22 R foot XR   Pertinent review of systems: No fevers or chills  Relevant historical information: Hypertension   Exam:  BP 120/76   Pulse 81   Ht 5' 5"$  (1.651 m)   Wt 220 lb (99.8 kg)   SpO2 97%   BMI 36.61 kg/m  General: Well Developed, well nourished, and in no acute distress.   MSK: Calcaneus normal appearing right Mildly tender palpation posterior calcaneus at Achilles tendon insertion.  Normal foot and ankle motion.      Assessment and Plan: 77 y.o. female with right chronic calcific Achilles tendinitis.  Improved with home exercise program.  Still having some symptoms.  Plan to continue home exercise program and check back with me as needed. Shockwave therapy may be a good next step if needed.    Discussed warning signs or symptoms. Please see discharge instructions. Patient expresses understanding.   The above documentation has been reviewed and is accurate and complete [name], M.D.  Total encounter time 20 minutes including face-to-face time with the patient and, reviewing past medical record, and charting on the date of service.

## 2022-07-28 ENCOUNTER — Encounter: Payer: Self-pay | Admitting: Family Medicine

## 2022-07-28 ENCOUNTER — Ambulatory Visit (INDEPENDENT_AMBULATORY_CARE_PROVIDER_SITE_OTHER): Payer: Medicare Other | Admitting: Family Medicine

## 2022-07-28 VITALS — BP 120/76 | HR 81 | Ht 65.0 in | Wt 220.0 lb

## 2022-07-28 DIAGNOSIS — M7661 Achilles tendinitis, right leg: Secondary | ICD-10-CM | POA: Diagnosis not present

## 2022-07-28 NOTE — Patient Instructions (Signed)
Thank you for coming in today.   Keep up the exercises.   Recheck with me as needed.   Next step could be shockwave. Ok to schedule that if you want.

## 2022-07-31 ENCOUNTER — Telehealth: Payer: Self-pay | Admitting: Internal Medicine

## 2022-07-31 DIAGNOSIS — E876 Hypokalemia: Secondary | ICD-10-CM

## 2022-07-31 NOTE — Telephone Encounter (Signed)
Labs placed for future do anytime she is overdue. [place] for refills.

## 2022-07-31 NOTE — Telephone Encounter (Signed)
MEDICATION:  levothyroxine (SYNTHROID) 175 MCG tablet amitriptyline (ELAVIL) 50 MG tablet lovastatin (MEVACOR) 20 MG tablet  PHARMACY:[facility] - [place], [place] - [address] [facility]   Comments: Patient said pharmacy told her to call her doctor to get her medications filled  **Let patient know to contact pharmacy at the end of the day to make sure medication is ready. **  ** Please notify patient to allow 48-72 hours to process**  **Encourage patient to contact the pharmacy for refills or they can request refills through [facility]**

## 2022-07-31 NOTE — Telephone Encounter (Signed)
Message sent to provider to review and patient made aware she may be asked to come in for blood work.  No additional questions at this time.

## 2022-08-03 ENCOUNTER — Other Ambulatory Visit: Payer: Self-pay

## 2022-08-03 DIAGNOSIS — E890 Postprocedural hypothyroidism: Secondary | ICD-10-CM

## 2022-08-03 MED ORDER — LOVASTATIN 20 MG PO TABS: ORAL_TABLET | ORAL | 3 refills | Status: DC

## 2022-08-03 MED ORDER — AMITRIPTYLINE HCL 50 MG PO TABS: ORAL_TABLET | ORAL | 3 refills | Status: DC

## 2022-08-03 MED ORDER — LEVOTHYROXINE SODIUM 175 MCG PO TABS: ORAL_TABLET | ORAL | 0 refills | Status: DC

## 2022-08-03 NOTE — Telephone Encounter (Signed)
Informed patient that her lab work has been put in and also I have sent in her refills as well to her pharmacy

## 2022-08-04 ENCOUNTER — Other Ambulatory Visit (INDEPENDENT_AMBULATORY_CARE_PROVIDER_SITE_OTHER): Payer: Medicare Other

## 2022-08-04 DIAGNOSIS — E876 Hypokalemia: Secondary | ICD-10-CM

## 2022-08-04 LAB — CBC
HCT: 32.9 % — ABNORMAL LOW (ref 36.0–46.0)
Hemoglobin: 10.9 g/dL — ABNORMAL LOW (ref 12.0–15.0)
MCHC: 33.1 g/dL (ref 30.0–36.0)
MCV: 88.3 fl (ref 78.0–100.0)
Platelets: 233 10*3/uL (ref 150.0–400.0)
RBC: 3.73 Mil/uL — ABNORMAL LOW (ref 3.87–5.11)
RDW: 14.8 % (ref 11.5–15.5)
WBC: 4.9 10*3/uL (ref 4.0–10.5)

## 2022-08-04 LAB — MAGNESIUM: Magnesium: 1.3 mg/dL — ABNORMAL LOW (ref 1.5–2.5)

## 2022-08-04 LAB — COMPREHENSIVE METABOLIC PANEL
ALT: 20 U/L (ref 0–35)
AST: 30 U/L (ref 0–37)
Albumin: 4 g/dL (ref 3.5–5.2)
Alkaline Phosphatase: 76 U/L (ref 39–117)
BUN: 19 mg/dL (ref 6–23)
CO2: 31 mEq/L (ref 19–32)
Calcium: 9.6 mg/dL (ref 8.4–10.5)
Chloride: 101 mEq/L (ref 96–112)
Creatinine, Ser: 1.08 mg/dL (ref 0.40–1.20)
GFR: 49.26 mL/min — ABNORMAL LOW (ref >60.00)
Glucose, Bld: 79 mg/dL (ref 70–99)
Potassium: 3.8 mEq/L (ref 3.5–5.1)
Sodium: 140 mEq/L (ref 135–145)
Total Bilirubin: 0.7 mg/dL (ref 0.2–1.2)
Total Protein: 7.5 g/dL (ref 6.0–8.3)

## 2022-08-12 ENCOUNTER — Ambulatory Visit: Payer: Medicare Other | Admitting: Internal Medicine

## 2022-08-13 ENCOUNTER — Encounter: Payer: Self-pay | Admitting: Plastic Surgery

## 2022-08-13 ENCOUNTER — Ambulatory Visit (INDEPENDENT_AMBULATORY_CARE_PROVIDER_SITE_OTHER): Payer: Medicare Other | Admitting: Plastic Surgery

## 2022-08-13 VITALS — BP 123/87 | HR 75

## 2022-08-13 DIAGNOSIS — C50312 Malignant neoplasm of lower-inner quadrant of left female breast: Secondary | ICD-10-CM

## 2022-08-13 NOTE — Progress Notes (Signed)
Ms. Riggsbee returns today for evaluation.  At her last visit she had a area of firm tissue in the left breast which has not improved.  Given her history of breast cancer I believe that we should remove this even though I am almost [phone] certain it is fat necrosis from her previous surgery.   On examination the area of firmness is at the 4 o'clock position on the left nipple and is approximately 3 x 5 cm in size  Will schedule for surgery as soon as possible. She understands that excision of the mass will worsen her asymmetry.

## 2022-08-14 ENCOUNTER — Encounter: Payer: Self-pay | Admitting: Internal Medicine

## 2022-08-14 NOTE — Assessment & Plan Note (Signed)
She stopped using oxygen and feels she sleeps at least as well without it. Plan-appropriate discussion.  We can revisit this issue in the future as needed.

## 2022-08-14 NOTE — Assessment & Plan Note (Signed)
There is room for better control of bronchospasm which may help dyspnea and wheeze. Plan-options reviewed.  Try Anoro

## 2022-08-17 ENCOUNTER — Telehealth: Payer: Self-pay | Admitting: Gastroenterology

## 2022-08-17 ENCOUNTER — Telehealth: Payer: Self-pay | Admitting: Internal Medicine

## 2022-08-17 MED ORDER — PANTOPRAZOLE SODIUM 40 MG PO TBEC: DELAYED_RELEASE_TABLET | ORAL | 1 refills | Status: DC

## 2022-08-17 NOTE — Telephone Encounter (Signed)
Called patient to inform that her medication was sent to requested pharmacy  told her to contact the office for a follow up soon.

## 2022-08-17 NOTE — Telephone Encounter (Signed)
Prescription Request  08/17/2022  LOV: 06/02/2022  What is the name of the medication or equipment? Magnesium oxide  Have you contacted your pharmacy to request a refill? No   Which pharmacy would you like this sent to?  [facility] - [place], [place] [address] [facility] [address] [place] [address] Phone: [phone] Fax: [phone]     Patient notified that their request is being sent to the clinical staff for review and that they should receive a response within 2 business days.   Please advise at Mobile [phone] (mobile)

## 2022-08-17 NOTE — Telephone Encounter (Signed)
Sent medication Protonix to [facility]

## 2022-08-17 NOTE — Telephone Encounter (Signed)
Patient called requesting a refill for Protonix to be sent to [facility].

## 2022-08-18 ENCOUNTER — Ambulatory Visit (INDEPENDENT_AMBULATORY_CARE_PROVIDER_SITE_OTHER): Payer: Medicare Other | Admitting: Internal Medicine

## 2022-08-18 ENCOUNTER — Encounter: Payer: Self-pay | Admitting: Internal Medicine

## 2022-08-18 VITALS — BP 122/60 | HR 75 | Temp 97.6°F | Ht 65.0 in | Wt 209.0 lb

## 2022-08-18 DIAGNOSIS — N3946 Mixed incontinence: Secondary | ICD-10-CM | POA: Insufficient documentation

## 2022-08-18 MED ORDER — MAGNESIUM OXIDE 400 MG PO TABS: 400.0000 mg | ORAL_TABLET | Freq: Two times a day (BID) | ORAL | 3 refills | Status: DC

## 2022-08-18 NOTE — Telephone Encounter (Signed)
Sent refill to [facility].Marland KitchenJohny Chess

## 2022-08-18 NOTE — Patient Instructions (Addendum)
We will be able to do the supplies so contact a medical supply place to see if [employer] will cover that.   We have given you the letter for the [employer]

## 2022-08-18 NOTE — Assessment & Plan Note (Signed)
Likely contributed by lasix. She does not want to stop this due to swelling in legs without it. She does have a need for incontinence supplies with pullups due to accidents. She does not want to take more medications which could have side effects. Informed that medicare does not cover but [employer] may and she can check with them and pick a DME company. There would be forms we would need to fill out for this to happen.

## 2022-08-18 NOTE — Progress Notes (Signed)
   Subjective:   Patient ID: [name], female    DOB: 1945/11/16, 77 y.o.   MRN: [id number]  HPI The patient is a 77 YO female coming in for urinary urgency and sometimes not making it to bathroom. Does not take lasix when she has to go out. Uses this for fluid and wants to keep as swelling bad without it.   Review of Systems  Constitutional: Negative.   HENT: Negative.    Eyes: Negative.   Respiratory:  Negative for cough, chest tightness and shortness of breath.   Cardiovascular:  Negative for chest pain, palpitations and leg swelling.  Gastrointestinal:  Negative for abdominal distention, abdominal pain, constipation, diarrhea, nausea and vomiting.  Genitourinary:  Positive for frequency and urgency.  Musculoskeletal: Negative.   Skin: Negative.   Neurological: Negative.   Psychiatric/Behavioral: Negative.      Objective:  Physical Exam Constitutional:      Appearance: She is well-developed.  HENT:     Head: Normocephalic and atraumatic.  Cardiovascular:     Rate and Rhythm: Normal rate and regular rhythm.  Pulmonary:     Effort: Pulmonary effort is normal. No respiratory distress.     Breath sounds: Normal breath sounds. No wheezing or rales.  Abdominal:     General: Bowel sounds are normal. There is no distension.     Palpations: Abdomen is soft.     Tenderness: There is no abdominal tenderness. There is no rebound.  Musculoskeletal:     Cervical back: Normal range of motion.  Skin:    General: Skin is warm and dry.  Neurological:     Mental Status: She is alert and oriented to person, place, and time.     Coordination: Coordination normal.     Vitals:   08/18/22 0936  BP: 122/60  Pulse: 75  Temp: 97.6 F (36.4 C)  TempSrc: Oral  SpO2: 98%  Weight: 209 lb (94.8 kg)  Height: 5\' 5"  (1.651 m)    Assessment & Plan:  Visit time 15 minutes in face to face communication with patient and coordination of care, additional 5 minutes spent in record  review, coordination or care, ordering tests, communicating/referring to other healthcare professionals, documenting in medical records all on the same day of the visit for total time 20 minutes spent on the visit.

## 2022-08-20 ENCOUNTER — Encounter: Payer: Self-pay | Admitting: Student

## 2022-08-20 ENCOUNTER — Ambulatory Visit (INDEPENDENT_AMBULATORY_CARE_PROVIDER_SITE_OTHER): Payer: Medicare Other | Admitting: Student

## 2022-08-20 ENCOUNTER — Other Ambulatory Visit: Payer: Self-pay | Admitting: Student

## 2022-08-20 VITALS — BP 159/93 | HR 74 | Ht 65.0 in | Wt 209.0 lb

## 2022-08-20 DIAGNOSIS — Z9889 Other specified postprocedural states: Secondary | ICD-10-CM

## 2022-08-20 DIAGNOSIS — N6489 Other specified disorders of breast: Secondary | ICD-10-CM

## 2022-08-20 MED ORDER — TRAMADOL HCL 50 MG PO TABS: 50.0000 mg | ORAL_TABLET | Freq: Three times a day (TID) | ORAL | 0 refills | Status: DC | PRN

## 2022-08-20 MED ORDER — ENOXAPARIN SODIUM 40 MG/0.4ML IJ SOSY: 40.0000 mg | PREFILLED_SYRINGE | INTRAMUSCULAR | 0 refills | Status: DC

## 2022-08-20 MED ORDER — ONDANSETRON HCL 4 MG PO TABS: 4.0000 mg | ORAL_TABLET | Freq: Three times a day (TID) | ORAL | 0 refills | Status: DC | PRN

## 2022-08-20 NOTE — Progress Notes (Addendum)
Patient ID: [name], female    DOB: [birth year], 77 y.o.   MRN: [id number]  Chief Complaint  Patient presents with   Pre-op Exam      ICD-10-CM   1. S/P bilateral breast reduction  Z98.890     2. Breast asymmetry  N64.89        History of Present Illness: [name] is a 77 y.o.  female  with a history of breast cancer.  She presents for preoperative evaluation for upcoming procedure, excision of tissue of left breast, scheduled for 08/28/2022 with Dr. Lovena Le.  The patient has not had problems with anesthesia.  She reports history of breast cancer.  She states that her last mammogram was last June which was negative.  Patient states that she was seeing a cardiologist a while ago for her breathing but states that she has not seen a cardiologist in over a year.  She denies any issues with her breathing since then.  Patient denies being on any blood thinners.  Patient reports she is not a smoker.  Patient denies being on any birth control or hormone replacement.  She reports history of 1 miscarriage a long time ago.  She denies any personal history of blood clots.  She states that her sister had a blood clot in her leg.  She denies any clotting diseases.  She denies any recent traumas, surgeries or hospitalizations.  She denies any history of stroke or heart attack.  Patient states she did have a Port-A-Cath at 1 point, but does not have it anymore.  Patient denies any history of Crohn's disease or ulcerative colitis.  She denies any history of COPD or asthma.  Patient does report she has varicose veins.  Patient denies any recent fevers, chills or changes in her health.  Summary of Previous Visit: Patient was seen by Dr. Lovena Le on 04/15/2022.  At this visit, patient reported that she undergone a breast reduction on the right and a breast lift on the left by Dr. Claudia Desanctis.  Patient return to the clinic reporting not she was unhappy with the ptosis on the right side.  She also  stated that she had a firm area at the 4 o'clock position of the left areola, which had developed since her surgery.  On exam, left breast was noted to have changes consistent with radiation.  There was a small amount of firmness at the 4 o'clock position of the left areola.  The nipple was noted to be somewhat malposition.  The right breast had been reduced and all incisions were healed well.  There was a grade 1 ptosis noted.  It was discussed with the patient that she could undergo revision of the surgery, but it was unlikely that it will result in long-term upper pole fullness due to her skin laxity.  Surgery was not recommended for her at that time.  The mass in the left breast appear to be scar tissue from her surgery, but patient was instructed to follow-up in 4 to 6 months for reevaluation.  It was discussed with the patient as the mass was unchanged or larger, we will plan for excision for pathologic diagnosis.  Patient return to the clinic on 08/13/2022.  At this visit, she reported that the area of firm tissue in the left breast had not improved.  It was believed that this is fat necrosis from her previous surgery.  On exam, there is firmness at the 4 o'clock position of the  left nipple and it was approximately 3 x 5 cm in size.  Plan was to schedule her for surgery.  It was discussed with the patient that excision of the mass will worsen her asymmetry.  Job: Does not work at this time  [place] Significant for: Breast cancer, hypertension, osteoarthritis, gout, iron deficiency anemia  Per chart review, patient's most recent hemoglobin was 10.9 a few weeks ago.  Patient states that this is normal for her.  Patient states that about a month ago, she received tramadol from her orthopedic physician.  She states that she receives it sometimes for her gout flareups but does not receive tramadol regularly.  Patient states she is currently out of tramadol.  She also states that she does not have Zofran at  home.  Patient's blood pressure today is in the 150s.  She states that she is stressed about this appointment.  She denies any chest pain or shortness of breath.  Patient has history of keloids  Past Medical History: Allergies: Allergies  Allergen Reactions   Aspirin Other (See Comments)    REACTION: Gi Intolerance/ Burning in stomach   Trazodone And Nefazodone Nausea And Vomiting    "sick"   Flagyl [Metronidazole] Rash   Morphine And Related Itching    Current Medications:  Current Outpatient Medications:    albuterol (VENTOLIN HFA) 108 (90 Base) MCG/ACT inhaler, Inhale 2 puffs into the lungs every 6 (six) hours as needed for wheezing or shortness of breath., Disp: 54 g, Rfl: 4   allopurinol (ZYLOPRIM) 100 MG tablet, Take 2 tablets (200 mg total) by mouth daily., Disp: 180 tablet, Rfl: 3   amitriptyline (ELAVIL) 50 MG tablet, TAKE ONE TABLET BY MOUTH EVERY DAY AT BEDTIME (USE CAUTION - MAY CAUSE DROWSINESS), Disp: 90 tablet, Rfl: 3   Calcium Carbonate-Vitamin D (CALCIUM-VITAMIN D) 600-200 MG-UNIT CAPS, Take 1 capsule by mouth daily., Disp: , Rfl:    cholecalciferol (VITAMIN D) 25 MCG (1000 UNIT) tablet, Take 1,000 Units by mouth daily., Disp: , Rfl:    colchicine 0.6 MG tablet, Take 1 tablet (0.6 mg total) by mouth daily as needed (gout or psuedogout pain)., Disp: 90 tablet, Rfl: 2   Cyanocobalamin (VITAMIN B 12 PO), Take 1,000 mcg by mouth daily., Disp: , Rfl:    dicyclomine (BENTYL) 20 MG tablet, Take 1 tablet (20 mg total) by mouth 3 (three) times daily before meals., Disp: 270 tablet, Rfl: 11   famotidine (PEPCID) 40 MG tablet, Take 1 tablet (40 mg total) by mouth daily., Disp: 90 tablet, Rfl: 3   ferrous sulfate 325 (65 FE) MG tablet, Take 1 tablet (325 mg total) by mouth daily with breakfast. (Patient taking differently: Take 325 mg by mouth daily as needed (Anemia).), Disp: 90 tablet, Rfl: 0   fluticasone (FLONASE) 50 MCG/ACT nasal spray, Place 2 sprays into both nostrils  daily. (Patient taking differently: Place 2 sprays into both nostrils daily as needed for allergies.), Disp: 48 g, Rfl: 3   furosemide (LASIX) 40 MG tablet, Take 40 mg by mouth daily., Disp: , Rfl:    levothyroxine (SYNTHROID) 175 MCG tablet, TAKE ONE TABLET BY MOUTH EVERY DAY BEFORE BREAKFAST., Disp: 90 tablet, Rfl: 0   loratadine (CLARITIN) 10 MG tablet, Take 1 tablet (10 mg total) by mouth daily. (Patient taking differently: Take 10 mg by mouth daily as needed for allergies.), Disp: 90 tablet, Rfl: 1   lovastatin (MEVACOR) 20 MG tablet, TAKE ONE TABLET BY MOUTH EVERY DAY AT 6:00PM, Disp: 90 tablet,  Rfl: 3   magnesium oxide (MAG-OX) 400 MG tablet, Take 1 tablet (400 mg total) by mouth 2 (two) times daily., Disp: 180 tablet, Rfl: 3   Multiple Vitamins-Minerals (MULTIVITAMIN WOMENS 50+ ADV PO), Take 1 tablet by mouth daily., Disp: , Rfl:    nitroGLYCERIN (NITROSTAT) 0.4 MG SL tablet, Place 1 tablet (0.4 mg total) under the tongue every 5 (five) minutes as needed., Disp: 25 tablet, Rfl: 6   ondansetron (ZOFRAN) 4 MG tablet, Take 1 tablet (4 mg total) by mouth every 8 (eight) hours as needed for nausea or vomiting., Disp: 20 tablet, Rfl: 0   pantoprazole (PROTONIX) 40 MG tablet, TAKE 1 TABLET BY MOUTH 30 MINUTES PRIOR TO BREAKFAST AND SUPPER, Disp: 180 tablet, Rfl: 1   potassium chloride SA (KLOR-CON M) 20 MEQ tablet, Take 1 tablet (20 mEq total) by mouth daily., Disp: 90 tablet, Rfl: 3   pregabalin (LYRICA) 75 MG capsule, Take 75 mg by mouth 2 (two) times daily., Disp: , Rfl:    traMADol (ULTRAM) 50 MG tablet, Take 1 tablet (50 mg total) by mouth every 8 (eight) hours as needed for severe pain., Disp: 15 tablet, Rfl: 0   umeclidinium-vilanterol (ANORO ELLIPTA) 62.5-25 MCG/ACT AEPB, Inhale 1 puff into the lungs daily., Disp: 180 each, Rfl: 4  Past Medical Problems: Past Medical History:  Diagnosis Date   Allergy    ANEMIA, IRON DEFICIENCY 05/08/2009   Angina    ASYMPTOMATIC POSTMENOPAUSAL STATUS  10/11/2008   Blood transfusion    Blood transfusion without reported diagnosis    Breast cancer ([place]) 09/29/2011   invasive grade III ductal ca,assoc high grade dcis,ER/PR=neg; left breast   C. difficile colitis    Cataract    Diverticulosis of colon (without mention of hemorrhage)    Esophageal reflux 06/12/2008   Gastroparesis    GOITER, MULTINODULAR 04/02/2009   Gout, unspecified    H/O hiatal hernia    History of kidney stones    History of lower GI bleeding    History of radiation therapy 02/08/12-03/25/12   left breast,total 61gy   Hypokalemia 05/11/2013   Hypomagnesemia    HYPOTHYROIDISM, POST-RADIATION 08/13/2009   Internal hemorrhoids without mention of complication    Kidney stones    "several"   Leukopenia    Migraines    Obesity    On supplemental oxygen therapy    at night while sleeping - patient did not want to undergo sleep study   Osteoarthrosis, unspecified whether generalized or localized, unspecified site    Other and unspecified hyperlipidemia    Personal history of chemotherapy 2013   Personal history of radiation therapy 2013   left   PONV (postoperative nausea and vomiting)    PUD (peptic ulcer disease)    Short bowel syndrome    Shortness of breath on exertion    "sometimes"   Stricture and stenosis of esophagus    Thyrotoxicosis without mention of goiter or other cause, without mention of thyrotoxic crisis or storm    Type II or unspecified type diabetes mellitus without mention of complication, not stated as uncontrolled    no med in years diet controled   Unspecified essential hypertension    UTI (urinary tract infection)    Varicose veins    VITAMIN B12 DEFICIENCY 08/30/2009    Past Surgical History: Past Surgical History:  Procedure Laterality Date   ABDOMINAL ADHESION SURGERY  1980's thru 1990's   "several"   ABDOMINAL HYSTERECTOMY  1970's   with BSO  BREAST BIOPSY Left 08/13/11   left breast lower inner quadrant   BREAST BIOPSY  Right 1985   Rt exc bx, benign   BREAST LUMPECTOMY Left 08/2011   BREAST LUMPECTOMY W/ NEEDLE LOCALIZATION  09/29/11   left  breast=lymph node,excision benign/ ER/PR=neg, her 2 Positive   BREAST REDUCTION SURGERY Bilateral 06/09/2021   Procedure: MAMMARY REDUCTION  (BREAST);  Surgeon: Cindra Presume, MD;  Location: [facility];  Service: Plastics;  Laterality: Bilateral;  2 hours   BUNIONECTOMY  1970's   bilateral   CHOLECYSTECTOMY  1990's   COLON SURGERY     "several surgeries for short bowel syndrome"   COLONOSCOPY  2012   multiple    DILATION AND CURETTAGE OF UTERUS     ESOPHAGOGASTRODUODENOSCOPY  2011   multiple    EXCISIONAL HEMORRHOIDECTOMY  11/10/2016   EYE SURGERY     "long time ago"   FLEXIBLE SIGMOIDOSCOPY  2011   multiple    KIDNEY STONE SURGERY  1990's   "tried to go up & get it but pushed it further up"   LITHOTRIPSY     "4 or 5 times"   MASTECTOMY W/ NODES PARTIAL  09/29/11   left   PORT-A-CATH REMOVAL Right 12/19/2013   Procedure: MINOR REMOVAL PORT-A-CATH;  Surgeon: Adin Hector, MD;  Location: [facility];  Service: General;  Laterality: Right;   PORTACATH PLACEMENT  09/29/2011   Procedure: INSERTION PORT-A-CATH;  Surgeon: Adin Hector, MD;  Location: [facility];  Service: General;  Laterality: N/A;   SMALL INTESTINE SURGERY     Thyroid Ultrasound  12/1994 and 12/1995   TOTAL KNEE ARTHROPLASTY Right 06/05/2015   Procedure: RIGHT TOTAL KNEE ARTHROPLASTY;  Surgeon: Ninetta Lights, MD;  Location: [facility];  Service: Orthopedics;  Laterality: Right;   UPPER GASTROINTESTINAL ENDOSCOPY     VEIN LIGATION AND STRIPPING  1980's   Right leg    Social History: Social History   Socioeconomic History   Marital status: Widowed    Spouse name: Not on file   Number of children: 2   Years of education: 10   Highest education level: 10th grade  Occupational History   Occupation: RETIRED  Tobacco Use   Smoking status: Former    Packs/day: 1.00    Years: 10.00     Additional pack years: 0.00    Total pack years: 10.00    Types: Cigarettes    Quit date: 09/22/1985    Years since quitting: 36.9   Smokeless tobacco: Never  Vaping Use   Vaping Use: Never used  Substance and Sexual Activity   Alcohol use: No   Drug use: No   Sexual activity: Not Currently    Birth control/protection: Surgical    Comment: 1st intercourse- 15, partners- 5  Other Topics Concern   Not on file  Social History Narrative   Not on file   Social Determinants of Health   Financial Resource Strain: High Risk (01/09/2022)   Overall Financial Resource Strain (CARDIA)    Difficulty of Paying Living Expenses: Very hard  Food Insecurity: No Food Insecurity (01/09/2022)   Hunger Vital Sign    Worried About Running Out of Food in the Last Year: Never true    Ran Out of Food in the Last Year: Never true  Transportation Needs: No Transportation Needs (01/09/2022)   PRAPARE - Hydrologist (Medical): No    Lack of Transportation (Non-Medical): No  Physical Activity: Inactive (  01/09/2022)   Exercise Vital Sign    Days of Exercise per Week: 0 days    Minutes of Exercise per Session: 0 min  Stress: No Stress Concern Present (01/09/2022)   [place]    Feeling of Stress : Not at all  Social Connections: Moderately Integrated (01/09/2022)   Social Connection and Isolation Panel [NHANES]    Frequency of Communication with Friends and Family: More than three times a week    Frequency of Social Gatherings with Friends and Family: Not on file    Attends Religious Services: More than 4 times per year    Active Member of [employer] or Organizations: Yes    Attends Archivist Meetings: More than 4 times per year    Marital Status: Widowed  Intimate Partner Violence: Not At Risk (01/09/2022)   Humiliation, Afraid, Rape, and Kick questionnaire    Fear of Current or Ex-Partner: No     Emotionally Abused: No    Physically Abused: No    Sexually Abused: No    Family History: Family History  Problem Relation Age of Onset   Esophageal cancer Son        deceased   Breast cancer Son        esophageal   Diabetes Mother    Heart disease Mother    Kidney disease Sister    Diabetes Father    Hypertension Father    Kidney disease Brother        x 3   Colon cancer Paternal Uncle    Breast cancer Maternal Aunt    Breast cancer Paternal Aunt    Breast cancer Cousin        Pt states she has 15+ cousins w/ Breast CA   Rectal cancer Neg Hx    Stomach cancer Neg Hx     Review of Systems: Denies any recent fevers, chills or recent changes in her health Physical Exam: Vital Signs BP (!) 159/93 (BP Location: Right Arm, Patient Position: Sitting, Cuff Size: Large)   Pulse 74   Ht 5\' 5"  (1.651 m)   Wt 209 lb (94.8 kg)   SpO2 98%   BMI 34.78 kg/m   Physical Exam  Constitutional:      General: Not in acute distress.    Appearance: Normal appearance. Not ill-appearing.  HENT:     Head: Normocephalic and atraumatic.  Neck:     Musculoskeletal: Normal range of motion.  Cardiovascular:     Rate and Rhythm: Normal rate Pulmonary:     Effort: Pulmonary effort is normal. No respiratory distress.  Musculoskeletal: Normal range of motion.  Skin:    General: Skin is warm and dry.     Findings: No erythema or rash.  Neurological:     Mental Status: Alert and oriented to person, place, and time. Mental status is at baseline.  Psychiatric:        Mood and Affect: Mood normal.        Behavior: Behavior normal.    Assessment/Plan: The patient is scheduled for excision of tissue of the left breast with Dr. Lovena Le.  Risks, benefits, and alternatives of procedure discussed, questions answered and consent obtained.    Smoking Status: Non-smoker; Counseling Given?  N/A Last Mammogram: 11/24/2021; Results: BI-RADS Category 1, negative  Caprini Score: 12; Risk Factors  include: Age, family history of thrombosis, varicose veins, history of malignancy, BMI > 25, and length of planned surgery. Recommendation for mechanical  and possible pharmacological prophylaxis. Encourage early ambulation. I will discuss with Dr. Lovena Le the possibility of postoperative lovenox.   Will send surgical clearance to patient's PCP.  Pictures obtained: 08/13/2022  Post-op Rx sent to pharmacy:  Tramadol, zofran   -patient states that she recently took tramadol without any issues.  I instructed the patient to hold any multivitamins and supplements 1 week prior to surgery.  I discussed with the patient to hold her colchicine and Lasix the day of surgery.  Patient expressed understanding.  Patient was provided with the General Surgical Risk consent document and Pain Medication Agreement prior to their appointment.  They had adequate time to read through the risk consent documents and Pain Medication Agreement. We also discussed them in person together during this preop appointment. All of their questions were answered to their satisfaction.  Recommended calling if they have any further questions.  Risk consent form and Pain Medication Agreement to be scanned into patient's chart.  The consent was obtained with risks and complications reviewed which included bleeding, pain, scar, infection and the risk of anesthesia.  The patients questions were answered to the patients expressed satisfaction.    Electronically signed by: Clance Boll, PA-C 08/20/2022 12:48 PM

## 2022-08-20 NOTE — Progress Notes (Signed)
Surgical Instructions    Your procedure is scheduled on Friday, August 28, 2022.  Report to [facility] Main Entrance "A" at 5:30 A.M., then check in with the Admitting office.  Call this number if you have problems the morning of surgery:  [phone]   If you have any questions prior to your surgery date call [phone]: Open Monday-Friday 8am-4pm If you experience any cold or flu symptoms such as cough, fever, chills, shortness of breath, etc. between now and your scheduled surgery, please notify us at the above number     Remember:  Do not eat after midnight the night before your surgery  You may drink clear liquids until 4:30 the morning of your surgery.   Clear liquids allowed are: Water, Non-Citrus Juices (without pulp), Carbonated Beverages, Clear Tea, Black Coffee ONLY (NO MILK, CREAM OR POWDERED CREAMER of any kind), and Gatorade   Take these medicines the morning of surgery with A SIP OF WATER:  allopurinol (ZYLOPRIM  famotidine (PEPCID)  levothyroxine (SYNTHROID)  pantoprazole (PROTONIX)  pregabalin (LYRICA)  umeclidinium-vilanterol (ANORO ELLIPTA)    As needed: albuterol (VENTOLIN HFA)  colchicine  fluticasone (FLONASE)  loratadine (CLARITIN)  ondansetron (ZOFRAN)  traMADol (ULTRAM)     As of today, STOP taking any Aspirin (unless otherwise instructed by your surgeon) Aleve, Naproxen, Ibuprofen, Motrin, Advil, Goody's, BC's, all herbal medications, fish oil, and all vitamins.   Do NOT Smoke (Tobacco/Vaping)  24 hours prior to your procedure  If you use a CPAP at night, you may bring your mask for your overnight stay.   Contacts, glasses, hearing aids, dentures or partials may not be worn into surgery, please bring cases for these belongings   For patients admitted to the hospital, discharge time will be determined by your treatment team.   Patients discharged the day of surgery will not be allowed to drive home, and someone needs to stay with them for 24  hours.   SURGICAL WAITING ROOM VISITATION Patients having surgery or a procedure may have no more than 2 support people in the waiting area - these visitors may rotate.   Children under the age of 107 must have an adult with them who is not the patient. If the patient needs to stay at the hospital during part of their recovery, the visitor guidelines for inpatient rooms apply. Pre-op nurse will coordinate an appropriate time for 1 support person to accompany patient in pre-op.  This support person may not rotate.   Please refer to [email or web address].hu for the visitor guidelines for Inpatients (after your surgery is over and you are in a regular room).    Special instructions:    Oral Hygiene is also important to reduce your risk of infection.  Remember - BRUSH YOUR TEETH THE MORNING OF SURGERY WITH YOUR REGULAR TOOTHPASTE   [facility]- Preparing For Surgery  Before surgery, you can play an important role. Because skin is not sterile, your skin needs to be as free of germs as possible. You can reduce the number of germs on your skin by washing with CHG (chlorahexidine gluconate) Soap before surgery.  CHG is an antiseptic cleaner which kills germs and bonds with the skin to continue killing germs even after washing.     Please do not use if you have an allergy to CHG or antibacterial soaps. If your skin becomes reddened/irritated stop using the CHG.  Do not shave (including legs and underarms) for at least 48 hours prior to first CHG shower. It is  OK to shave your face.  Please follow these instructions carefully.     Shower the NIGHT BEFORE SURGERY and the MORNING OF SURGERY with CHG Soap.   If you chose to wash your hair, wash your hair first as usual with your normal shampoo. After you shampoo, rinse your hair and body thoroughly to remove the shampoo.  Then [employer] and genitals (private parts) with your normal soap and rinse thoroughly  to remove soap.  After that Use CHG Soap as you would any other liquid soap. You can apply CHG directly to the skin and wash gently with a scrungie or a clean washcloth.   Apply the CHG Soap to your body ONLY FROM THE NECK DOWN.  Do not use on open wounds or open sores. Avoid contact with your eyes, ears, mouth and genitals (private parts). Wash Face and genitals (private parts)  with your normal soap.   Wash thoroughly, paying special attention to the area where your surgery will be performed.  Thoroughly rinse your body with warm water from the neck down.  DO NOT shower/wash with your normal soap after using and rinsing off the CHG Soap.  Pat yourself dry with a CLEAN TOWEL.  Wear CLEAN PAJAMAS to bed the night before surgery  Place CLEAN SHEETS on your bed the night before your surgery  DO NOT SLEEP WITH PETS.   Day of Surgery:  Take a shower with CHG soap. Wear Clean/Comfortable clothing the morning of surgery Do not wear jewelry or makeup. Do not wear lotions, powders, perfumes or deodorant. Do not shave 48 hours prior to surgery.   Do not bring valuables to the hospital. Do not wear nail polish, gel polish, artificial nails, or any other type of covering on natural nails (fingers and toes) If you have artificial nails or gel coating that need to be removed by a nail salon, please have this removed prior to surgery. Artificial nails or gel coating may interfere with anesthesia's ability to adequately monitor your vital signs.  [facility] is not responsible for any belongings or valuables.     Remember to brush your teeth WITH YOUR REGULAR TOOTHPASTE.    If you received a COVID test during your pre-op visit, it is requested that you wear a mask when out in public, stay away from anyone that may not be feeling well, and notify your surgeon if you develop symptoms. If you have been in contact with anyone that has tested positive in the last 10 days, please notify your  surgeon.    Please read over the following fact sheets that you were given.

## 2022-08-20 NOTE — Progress Notes (Signed)
I spoke with Dr. Lovena Le about possible postoperative Lovenox.  We will plan to do a dose the day of surgery and a dose after surgery.  I called the patient and updated her with the plan.  Patient expressed understanding and states that she has taken Lovenox in the past.  I told her to take the Lovenox injection 24 hours after surgery.  Will call it in today.  I instructed the patient to call with any questions or concerns she may have.

## 2022-08-21 ENCOUNTER — Telehealth: Payer: Self-pay | Admitting: *Deleted

## 2022-08-21 ENCOUNTER — Other Ambulatory Visit: Payer: Self-pay

## 2022-08-21 ENCOUNTER — Encounter (HOSPITAL_COMMUNITY): Payer: Self-pay

## 2022-08-21 ENCOUNTER — Telehealth: Payer: Self-pay

## 2022-08-21 ENCOUNTER — Encounter (HOSPITAL_COMMUNITY)
Admission: RE | Admit: 2022-08-21 | Discharge: 2022-08-21 | Disposition: A | Discharge status: Home / Self Care | Payer: Medicare Other | Source: Ambulatory Visit | Attending: Plastic Surgery | Admitting: Plastic Surgery

## 2022-08-21 VITALS — BP 149/78 | HR 74 | Temp 97.7°F | Resp 17 | Ht 65.0 in | Wt 209.0 lb

## 2022-08-21 DIAGNOSIS — E1143 Type 2 diabetes mellitus with diabetic autonomic (poly)neuropathy: Secondary | ICD-10-CM | POA: Insufficient documentation

## 2022-08-21 DIAGNOSIS — D509 Iron deficiency anemia, unspecified: Secondary | ICD-10-CM | POA: Insufficient documentation

## 2022-08-21 DIAGNOSIS — Z01818 Encounter for other preprocedural examination: Secondary | ICD-10-CM

## 2022-08-21 DIAGNOSIS — Z01812 Encounter for preprocedural laboratory examination: Secondary | ICD-10-CM | POA: Diagnosis not present

## 2022-08-21 DIAGNOSIS — G8929 Other chronic pain: Secondary | ICD-10-CM | POA: Diagnosis not present

## 2022-08-21 DIAGNOSIS — I10 Essential (primary) hypertension: Secondary | ICD-10-CM | POA: Diagnosis not present

## 2022-08-21 DIAGNOSIS — M25561 Pain in right knee: Secondary | ICD-10-CM | POA: Insufficient documentation

## 2022-08-21 DIAGNOSIS — C50312 Malignant neoplasm of lower-inner quadrant of left female breast: Secondary | ICD-10-CM | POA: Diagnosis not present

## 2022-08-21 DIAGNOSIS — E1136 Type 2 diabetes mellitus with diabetic cataract: Secondary | ICD-10-CM | POA: Insufficient documentation

## 2022-08-21 HISTORY — DX: Polyneuropathy, unspecified: G62.9

## 2022-08-21 HISTORY — DX: Acute embolism and thrombosis of unspecified deep veins of unspecified lower extremity: I82.409

## 2022-08-21 NOTE — Progress Notes (Addendum)
PCP - Dr. Lesly Rubenstein  Cardiologist - Dr. Marcina Millard  EP-no  Endocrine-no  Pulm-Dr. Annamaria Boots  Nephrologist-Dr. Posey Pronto  Chest x-ray - NA  EKG - 08/21/22  Stress Test - 2012  ECHO - 08/19/20  Cardiac Cath -   Dialysis-no  Sleep Study - no CPAP - no Oxygen orderes at night , patient does not use it, Dr. Annamaria Boots aware.  LABS-CBC, CMP- 08/04/22  ASA-no  ERAS-yes  HA1C-no Fasting Blood Sugar - does not check Checks Blood Sugar 0 times a day  Anesthesia-Ms Melbourne denies s/s of upper or lower respiratory infections in the past 8 weeks.  Ms Hammonds has a AAA, followed by Dr. Nadean Corwin, patient has not seen cardiologist since 4/ 2022, was supposed to return to the office 03/2021.  Pt denies having chest pain, sob, or fever at this time. All instructions explained to the pt, with a verbal understanding of the material. Pt agrees to go over the instructions while at home for a better understanding. Pt also instructed to self quarantine after being tested for COVID-19. The opportunity to ask questions was provided.

## 2022-08-21 NOTE — Telephone Encounter (Signed)
Surgical clarence form

## 2022-08-21 NOTE — Telephone Encounter (Signed)
Faxed request for surgical clearance to pt's PCP Dr. Pricilla Holm with confirmation received

## 2022-08-21 NOTE — Progress Notes (Signed)
Surgical Instructions  Your procedure is scheduled on Friday, August 28, 2022.  Report to [facility] Main Entrance "A" at 5:30 A.M., then check in with the Admitting office.  Call this number if you have problems the morning of surgery:  [phone] pre surgery desk   If you have any questions prior to your surgery date call [phone]: Open Monday-Friday 8am-4pm  If you experience any cold or flu symptoms such as cough, fever, chills, shortness of breath, etc. between now and your scheduled surgery, please notify us at the above number     Remember:  Do not eat after midnight the night before your surgery  You may drink clear liquids until 4:30 the morning of your surgery.   Clear liquids allowed are: Water, Non-Citrus Juices (without pulp), Carbonated Beverages, Clear Tea, Black Coffee ONLY (NO MILK, CREAM OR POWDERED CREAMER of any kind), and Gatorade   Take these medicines the morning of surgery with A SIP OF WATER:  allopurinol (ZYLOPRIM  famotidine (PEPCID)  levothyroxine (SYNTHROID)  pantoprazole (PROTONIX)  pregabalin (LYRICA)  umeclidinium-vilanterol (ANORO ELLIPTA)    As needed: albuterol (VENTOLIN HFA)  colchicine  fluticasone (FLONASE)  loratadine (CLARITIN)  ondansetron (ZOFRAN)  traMADol (ULTRAM)  NTG ( if needed take, then go to Emergency Room)  As of today, STOP taking any Aspirin (unless otherwise instructed by your surgeon) Aleve, Naproxen, Ibuprofen, Motrin, Advil, Goody's, BC's, all herbal medications, fish oil, and all vitamins.  Follow your surgeon's instructions regarding Lovenox.  Special instructions:    Oral Hygiene is also important to reduce your risk of infection.  Remember - BRUSH YOUR TEETH THE MORNING OF SURGERY WITH YOUR REGULAR TOOTHPASTE   [facility]- Preparing For Surgery  Before surgery, you can play an important role. Because skin is not sterile, your skin needs to be as free of germs as possible. You can reduce the number of  germs on your skin by washing with CHG (chlorahexidine gluconate) Soap before surgery.  CHG is an antiseptic cleaner which kills germs and bonds with the skin to continue killing germs even after washing.     Please do not use if you have an allergy to CHG or antibacterial soaps. If your skin becomes reddened/irritated stop using the CHG.  Do not shave (including legs and underarms) for at least 48 hours prior to first CHG shower. It is OK to shave your face.  Please follow these instructions carefully.    Shower the NIGHT BEFORE SURGERY and the MORNING OF SURGERY with CHG Soap.   If you chose to wash your hair, wash your hair first as usual with your normal shampoo. After you shampoo, rinse your hair and body thoroughly to remove the shampoo.  Then [employer] and genitals (private parts) with your normal soap and rinse thoroughly to remove soap.  After that Use CHG Soap as you would any other liquid soap. You can apply CHG directly to the skin and wash gently with a scrungie or a clean washcloth.   Apply the CHG Soap to your body ONLY FROM THE NECK DOWN.  Do not use on open wounds or open sores. Avoid contact with your eyes, ears, mouth and genitals (private parts).  Wash thoroughly, paying special attention to the area where your surgery will be performed.  Thoroughly rinse your body with warm water from the neck down.  DO NOT shower/wash with your normal soap after using and rinsing off the CHG Soap.  Pat yourself dry with a CLEAN TOWEL.  Wear CLEAN PAJAMAS to bed the night before surgery  Place CLEAN SHEETS on your bed the night before your surgery  DO NOT SLEEP WITH PETS.  Day of Surgery: Shower as written above Take a shower with CHG soap. Wear Clean/Comfortable clothing the morning of surgery Do not wear jewelry or makeup. Do not wear lotions, powders, perfumes or deodorant. Do not shave 48 hours prior to surgery.   Do not bring valuables to the hospital. Do not wear nail  polish, gel polish, artificial nails, or any other type of covering on natural nails (fingers and toes) If you have artificial nails or gel coating that need to be removed by a nail salon, please have this removed prior to surgery. Artificial nails or gel coating may interfere with anesthesia's ability to adequately monitor your vital signs.  [facility] is not responsible for any belongings or valuables.     Remember to brush your teeth WITH YOUR REGULAR TOOTHPASTE.  Please read over the following fact sheets that you were given.   Do NOT Smoke (Tobacco/Vaping)  24 hours prior to your procedure  If you use a CPAP at night, you may bring your mask for your overnight stay.   Contacts, glasses, hearing aids, dentures or partials may not be worn into surgery, please bring cases for these belongings   For patients admitted to the hospital, discharge time will be determined by your treatment team.   Patients discharged the day of surgery will not be allowed to drive home, and someone needs to stay with them for 24 hours.   SURGICAL WAITING ROOM VISITATION Patients having surgery or a procedure may have no more than 2 support people in the waiting area - these visitors may rotate.   Children under the age of 64 must have an adult with them who is not the patient. If the patient needs to stay at the hospital during part of their recovery, the visitor guidelines for inpatient rooms apply. Pre-op nurse will coordinate an appropriate time for 1 support person to accompany patient in pre-op.  This support person may not rotate.   Please refer to [email or web address] for the visitor guidelines for Inpatients (after your surgery is over and you are in a regular room).

## 2022-08-24 NOTE — Progress Notes (Signed)
Anesthesia Chart Review:   Case: [id number] Date/Time: 08/28/22 0715   Procedure: EXCISION TISSUE OF LEFT BREAST (Left: Breast)   Anesthesia type: Choice   Pre-op diagnosis: Primary cancer of lower-inner quadrant of left female breast   Location: [facility] OR ROOM 09 / [facility]   Surgeons: Camillia Herter, MD       DISCUSSION: Pt is 77 years old with hx ascending aortic and aortic root dilatation, HTN, DM, iron deficiency anemia, uses supplemental O2 at night  Reviewed ascending aorta imaging results with Dr. Glennon Mac   VS: BP (!) 149/78   Pulse 74   Temp 36.5 C   Resp 17   Ht 5\' 5"  (1.651 m)   Wt 94.8 kg   SpO2 100%   BMI 34.78 kg/m   PROVIDERS: - PCP is Hoyt Koch, MD - Saw cardiologist Cherlynn Kaiser, MD in 2022 for chest pain. Last office visit 09/05/20. No source of chest pain identified. F/u in 6 months or sooner if chest pain recurred recommended. Pt has not returned to cardiology.   LABS: Labs reviewed: Acceptable for surgery. - CBC w/diff 08/04/22: hgb 10.9, hct 32.9.  - CMP 08/04/22 acceptable for surgery  IMAGES: CT chest 07/02/22:  1. No acute intrathoracic process. 2. No pulmonary nodule or mass. 3. 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 Recommend annual imaging followup by CTA or MRA.  4. Coronary artery calcifications.   EKG 08/21/22: Sinus rhythm with 1st degree A-V block. Moderate voltage criteria for LVH, may be normal variant ( R in aVL , Cornell product)   CV: Echo 08/19/20:  1. Left ventricular ejection fraction, by estimation, is 60 to 65%. The left ventricle has normal function. The left ventricle has no regional wall motion abnormalities. There is mild left ventricular hypertrophy. Left ventricular diastolic parameters were normal.  2. Right ventricular systolic function is normal. The right ventricular size is normal.  3. The mitral valve is normal in structure. No evidence of mitral valve  regurgitation. No evidence of mitral stenosis.  4. The aortic valve is normal in structure. Aortic valve regurgitation is mild. No aortic stenosis is present.  5. See above regarding sinus dilatation Ascending aortic root also dilated at 4.1 cm Dilatation is largest in the non coronary sinus . Aortic dilatation noted. There is severe dilatation of the aortic root, measuring 49 mm.   6. The inferior vena cava is normal in size with greater than 50% respiratory variability, suggesting right atrial pressure of 3 mmHg.   Coronary CT 07/12/2019: IMPRESSION: 1. The patient's coronary artery calcium score is 32, which places the patient in the 57 percentile.  2. Normal coronary origin with right dominance. 3. Mild CAD, CADRADS = 2. Mild calcified plaque in the proximal and mid LAD.  4. Sinus of Valsalva dilatation measuring 49 mm from Left to Non coronary cusp.   Past Medical History:  Diagnosis Date   Allergy    ANEMIA, IRON DEFICIENCY 05/08/2009   Angina    ASYMPTOMATIC POSTMENOPAUSAL STATUS 10/11/2008   Blood transfusion    Blood transfusion without reported diagnosis    Breast cancer ([place]) 09/29/2011   invasive grade III ductal ca,assoc high grade dcis,ER/PR=neg; left breast   C. difficile colitis    Cataract    Diverticulosis of colon (without mention of hemorrhage)    DVT (deep venous thrombosis) ([place])    08/21/22 "YEARS AGO"   Esophageal reflux 06/12/2008   Gastroparesis    GOITER,  MULTINODULAR 04/02/2009   Gout, unspecified    H/O hiatal hernia    History of kidney stones    History of lower GI bleeding    History of radiation therapy 02/08/12-03/25/12   left breast,total 61gy   Hypokalemia 05/11/2013   Hypomagnesemia    HYPOTHYROIDISM, POST-RADIATION 08/13/2009   Internal hemorrhoids without mention of complication    Kidney stones    "several"   Leukopenia    Migraines    Neuropathy    Obesity    On supplemental oxygen therapy    at night while sleeping - patient did  not want to undergo sleep study   Osteoarthrosis, unspecified whether generalized or localized, unspecified site    Other and unspecified hyperlipidemia    Personal history of chemotherapy 2013   Personal history of radiation therapy 2013   left   PONV (postoperative nausea and vomiting)    PUD (peptic ulcer disease)    Short bowel syndrome    Shortness of breath on exertion    "sometimes"   Stricture and stenosis of esophagus    Thyrotoxicosis without mention of goiter or other cause, without mention of thyrotoxic crisis or storm    Type II or unspecified type diabetes mellitus without mention of complication, not stated as uncontrolled    no med in years diet controled   Unspecified essential hypertension    UTI (urinary tract infection)    Varicose veins    VITAMIN B12 DEFICIENCY 08/30/2009    Past Surgical History:  Procedure Laterality Date   ABDOMINAL ADHESION SURGERY  1980's thru 1990's   "several"   ABDOMINAL HYSTERECTOMY  1970's   with BSO   BREAST BIOPSY Left 08/13/11   left breast lower inner quadrant   BREAST BIOPSY Right 1985   Rt exc bx, benign   BREAST LUMPECTOMY Left 08/2011   BREAST LUMPECTOMY W/ NEEDLE LOCALIZATION  09/29/11   left  breast=lymph node,excision benign/ ER/PR=neg, her 2 Positive   BREAST REDUCTION SURGERY Bilateral 06/09/2021   Procedure: MAMMARY REDUCTION  (BREAST);  Surgeon: Cindra Presume, MD;  Location: [facility];  Service: Plastics;  Laterality: Bilateral;  2 hours   BUNIONECTOMY  1970's   bilateral   CHOLECYSTECTOMY  1990's   COLON SURGERY     "several surgeries for short bowel syndrome"   COLONOSCOPY  2012   multiple    DILATION AND CURETTAGE OF UTERUS     ESOPHAGOGASTRODUODENOSCOPY  2011   multiple    EXCISIONAL HEMORRHOIDECTOMY  11/10/2016   EYE SURGERY     "long time ago"   FLEXIBLE SIGMOIDOSCOPY  2011   multiple    KIDNEY STONE SURGERY  1990's   "tried to go up & get it but pushed it further up"   LITHOTRIPSY     "4 or 5 times"    MASTECTOMY W/ NODES PARTIAL  09/29/11   left   PORT-A-CATH REMOVAL Right 12/19/2013   Procedure: MINOR REMOVAL PORT-A-CATH;  Surgeon: Adin Hector, MD;  Location: [facility];  Service: General;  Laterality: Right;   PORTACATH PLACEMENT  09/29/2011   Procedure: INSERTION PORT-A-CATH;  Surgeon: Adin Hector, MD;  Location: [facility];  Service: General;  Laterality: N/A;   SMALL INTESTINE SURGERY     Thyroid Ultrasound  12/1994 and 12/1995   TOTAL KNEE ARTHROPLASTY Right 06/05/2015   Procedure: RIGHT TOTAL KNEE ARTHROPLASTY;  Surgeon: Ninetta Lights, MD;  Location: [facility];  Service: Orthopedics;  Laterality: Right;   UPPER GASTROINTESTINAL  ENDOSCOPY     VEIN LIGATION AND STRIPPING  1980's   Right leg    MEDICATIONS:  albuterol (VENTOLIN HFA) 108 (90 Base) MCG/ACT inhaler   allopurinol (ZYLOPRIM) 100 MG tablet   amitriptyline (ELAVIL) 50 MG tablet   Calcium Carbonate-Vitamin D (CALCIUM-VITAMIN D) 600-200 MG-UNIT CAPS   cholecalciferol (VITAMIN D) 25 MCG (1000 UNIT) tablet   colchicine 0.6 MG tablet   Cyanocobalamin (VITAMIN B 12 PO)   dicyclomine (BENTYL) 20 MG tablet   enoxaparin (LOVENOX) 40 MG/0.4ML injection   famotidine (PEPCID) 40 MG tablet   ferrous sulfate 325 (65 FE) MG tablet   fluticasone (FLONASE) 50 MCG/ACT nasal spray   furosemide (LASIX) 40 MG tablet   levothyroxine (SYNTHROID) 175 MCG tablet   loratadine (CLARITIN) 10 MG tablet   lovastatin (MEVACOR) 20 MG tablet   magnesium oxide (MAG-OX) 400 MG tablet   Multiple Vitamins-Minerals (MULTIVITAMIN WOMENS 50+ ADV PO)   nitroGLYCERIN (NITROSTAT) 0.4 MG SL tablet   ondansetron (ZOFRAN) 4 MG tablet   pantoprazole (PROTONIX) 40 MG tablet   potassium chloride SA (KLOR-CON M) 20 MEQ tablet   pregabalin (LYRICA) 75 MG capsule   traMADol (ULTRAM) 50 MG tablet   umeclidinium-vilanterol (ANORO ELLIPTA) 62.5-25 MCG/ACT AEPB   No current facility-administered medications for this encounter.    If no changes, I  anticipate pt can proceed with surgery as scheduled.   Willeen Cass, PhD, FNP-BC [facility]/Anesthesiology Phone: [phone] 08/24/2022 3:45 PM

## 2022-08-24 NOTE — Anesthesia Preprocedure Evaluation (Addendum)
Anesthesia Evaluation  Patient identified by MRN, date of birth, ID band Patient awake    Reviewed: Allergy & Precautions, NPO status , Patient's Chart, lab work & pertinent test results  History of Anesthesia Complications (+) PONV  Airway Mallampati: II  TM Distance: >3 FB Neck ROM: Full    Dental  (+) Dental Advisory Given   Pulmonary COPD (no longer requires home O2), former smoker   breath sounds clear to auscultation       Cardiovascular hypertension, Pt. on medications (-) angina + DVT   Rhythm:Regular Rate:Normal  '22 ECHO: EF 55-60%, normal LVF, mild LVH, normal RVF, mild AI   Neuro/Psych  Headaches    GI/Hepatic Neg liver ROS,GERD  Medicated and Poorly Controlled,,  Endo/Other  diabetes (diet controlled, glu 71)Hypothyroidism    Renal/GU H/o stones     Musculoskeletal  (+) Arthritis ,    Abdominal   Peds  Hematology   Anesthesia Other Findings Breast cancer: chemo, XRT  Reproductive/Obstetrics                             Anesthesia Physical Anesthesia Plan  ASA: 3  Anesthesia Plan: General   Post-op Pain Management: Tylenol PO (pre-op)*   Induction: Intravenous  PONV Risk Score and Plan: 4 or greater and Ondansetron, Dexamethasone and Treatment may vary due to age or medical condition  Airway Management Planned: Oral ETT  Additional Equipment: None  Intra-op Plan:   Post-operative Plan: Extubation in OR  Informed Consent: I have reviewed the patients History and Physical, chart, labs and discussed the procedure including the risks, benefits and alternatives for the proposed anesthesia with the patient or authorized representative who has indicated his/her understanding and acceptance.     Dental advisory given  Plan Discussed with: CRNA and Surgeon  Anesthesia Plan Comments: (See APP note by Durel Salts, FNP )       Anesthesia Quick Evaluation

## 2022-08-25 ENCOUNTER — Telehealth: Payer: Self-pay | Admitting: *Deleted

## 2022-08-25 ENCOUNTER — Encounter: Payer: Self-pay | Admitting: Student

## 2022-08-25 ENCOUNTER — Telehealth: Payer: Self-pay

## 2022-08-25 NOTE — Progress Notes (Signed)
Surgical Clearance has been received from patient's PCP, Dr. Pricilla Holm, for patient's upcoming surgery with Dr. Lovena Le .

## 2022-08-25 NOTE — Telephone Encounter (Signed)
Faxed over Surgical Braulio Conte to [facility]

## 2022-08-25 NOTE — Telephone Encounter (Signed)
Surgical clearance received from Dr. Sharlet Salina- pt is cleared for surgery. Copy forwarded to Donnamarie Rossetti, PA-C and sent to batch

## 2022-08-25 NOTE — H&P (View-Only) (Signed)
Surgical Clearance has been received from patient's PCP, Dr. Elizabeth Crawford, for patient's upcoming surgery with Dr. Taylor .    

## 2022-08-28 ENCOUNTER — Encounter (HOSPITAL_COMMUNITY): Payer: Self-pay | Admitting: Plastic Surgery

## 2022-08-28 ENCOUNTER — Encounter (HOSPITAL_COMMUNITY)
Admission: RE | Disposition: A | Discharge status: Home / Self Care | Payer: Self-pay | Source: Home / Self Care | Attending: Plastic Surgery

## 2022-08-28 ENCOUNTER — Ambulatory Visit (HOSPITAL_BASED_OUTPATIENT_CLINIC_OR_DEPARTMENT_OTHER): Payer: Medicare Other | Admitting: Registered Nurse

## 2022-08-28 ENCOUNTER — Ambulatory Visit (HOSPITAL_COMMUNITY): Payer: Medicare Other | Admitting: Emergency Medicine

## 2022-08-28 ENCOUNTER — Other Ambulatory Visit: Payer: Self-pay

## 2022-08-28 ENCOUNTER — Ambulatory Visit (HOSPITAL_COMMUNITY)
Admission: RE | Admit: 2022-08-28 | Discharge: 2022-08-28 | Disposition: A | Discharge status: Home / Self Care | Payer: Medicare Other | Attending: Plastic Surgery | Admitting: Plastic Surgery

## 2022-08-28 DIAGNOSIS — J449 Chronic obstructive pulmonary disease, unspecified: Secondary | ICD-10-CM

## 2022-08-28 DIAGNOSIS — N6320 Unspecified lump in the left breast, unspecified quadrant: Secondary | ICD-10-CM | POA: Diagnosis not present

## 2022-08-28 DIAGNOSIS — M109 Gout, unspecified: Secondary | ICD-10-CM | POA: Insufficient documentation

## 2022-08-28 DIAGNOSIS — I10 Essential (primary) hypertension: Secondary | ICD-10-CM

## 2022-08-28 DIAGNOSIS — Z87891 Personal history of nicotine dependence: Secondary | ICD-10-CM | POA: Diagnosis not present

## 2022-08-28 DIAGNOSIS — M1990 Unspecified osteoarthritis, unspecified site: Secondary | ICD-10-CM | POA: Diagnosis not present

## 2022-08-28 DIAGNOSIS — N6092 Unspecified benign mammary dysplasia of left breast: Secondary | ICD-10-CM | POA: Diagnosis not present

## 2022-08-28 DIAGNOSIS — N6032 Fibrosclerosis of left breast: Secondary | ICD-10-CM

## 2022-08-28 DIAGNOSIS — Z853 Personal history of malignant neoplasm of breast: Secondary | ICD-10-CM | POA: Diagnosis not present

## 2022-08-28 DIAGNOSIS — N6489 Other specified disorders of breast: Secondary | ICD-10-CM | POA: Insufficient documentation

## 2022-08-28 DIAGNOSIS — Z9889 Other specified postprocedural states: Secondary | ICD-10-CM | POA: Diagnosis not present

## 2022-08-28 DIAGNOSIS — D509 Iron deficiency anemia, unspecified: Secondary | ICD-10-CM | POA: Insufficient documentation

## 2022-08-28 DIAGNOSIS — N6012 Diffuse cystic mastopathy of left breast: Secondary | ICD-10-CM | POA: Diagnosis not present

## 2022-08-28 HISTORY — PX: MASS EXCISION: SHX2000

## 2022-08-28 LAB — GLUCOSE, CAPILLARY
Glucose-Capillary: 90 mg/dL (ref 70–99)
Glucose-Capillary: 71 mg/dL (ref 70–99)
Glucose-Capillary: 100 mg/dL — ABNORMAL HIGH (ref 70–99)

## 2022-08-28 SURGERY — EXCISION MASS
Anesthesia: General | Site: Breast | Laterality: Left

## 2022-08-28 MED ORDER — EPHEDRINE 5 MG/ML INJ
INTRAVENOUS | Status: AC
  Filled 2022-08-28: qty 5

## 2022-08-28 MED ORDER — CHLORHEXIDINE GLUCONATE CLOTH 2 % EX PADS: 6.0000 | MEDICATED_PAD | Freq: Once | CUTANEOUS | Status: DC

## 2022-08-28 MED ORDER — LIDOCAINE-EPINEPHRINE 1 %-1:100000 IJ SOLN
INTRAMUSCULAR | Status: DC | PRN
  Administered 2022-08-28: 15 mL via SURGICAL_CAVITY

## 2022-08-28 MED ORDER — ACETAMINOPHEN 500 MG PO TABS
1000.0000 mg | ORAL_TABLET | Freq: Once | ORAL | Status: AC
  Administered 2022-08-28: 1000 mg via ORAL
  Filled 2022-08-28: qty 2

## 2022-08-28 MED ORDER — OXYCODONE HCL 5 MG/5ML PO SOLN: 5.0000 mg | Freq: Once | ORAL | Status: DC | PRN

## 2022-08-28 MED ORDER — CHLORHEXIDINE GLUCONATE 0.12 % MT SOLN
15.0000 mL | Freq: Once | OROMUCOSAL | Status: AC
  Administered 2022-08-28: 15 mL via OROMUCOSAL
  Filled 2022-08-28: qty 15

## 2022-08-28 MED ORDER — ORAL CARE MOUTH RINSE: 15.0000 mL | Freq: Once | OROMUCOSAL | Status: AC

## 2022-08-28 MED ORDER — FENTANYL CITRATE (PF) 250 MCG/5ML IJ SOLN
INTRAMUSCULAR | Status: AC
  Filled 2022-08-28: qty 5

## 2022-08-28 MED ORDER — LIDOCAINE 2% (20 MG/ML) 5 ML SYRINGE
INTRAMUSCULAR | Status: AC
  Filled 2022-08-28: qty 5

## 2022-08-28 MED ORDER — MIDAZOLAM HCL 2 MG/2ML IJ SOLN: 0.5000 mg | Freq: Once | INTRAMUSCULAR | Status: DC | PRN

## 2022-08-28 MED ORDER — FENTANYL CITRATE (PF) 250 MCG/5ML IJ SOLN
INTRAMUSCULAR | Status: DC | PRN
  Administered 2022-08-28: 100 ug via INTRAVENOUS

## 2022-08-28 MED ORDER — SUGAMMADEX SODIUM 200 MG/2ML IV SOLN
INTRAVENOUS | Status: DC | PRN
  Administered 2022-08-28: 200 mg via INTRAVENOUS

## 2022-08-28 MED ORDER — DEXAMETHASONE SODIUM PHOSPHATE 10 MG/ML IJ SOLN
INTRAMUSCULAR | Status: AC
  Filled 2022-08-28: qty 1

## 2022-08-28 MED ORDER — ROCURONIUM BROMIDE 10 MG/ML (PF) SYRINGE
PREFILLED_SYRINGE | INTRAVENOUS | Status: DC | PRN
  Administered 2022-08-28: 50 mg via INTRAVENOUS

## 2022-08-28 MED ORDER — INSULIN ASPART 100 UNIT/ML IJ SOLN: 0.0000 [IU] | INTRAMUSCULAR | Status: DC | PRN

## 2022-08-28 MED ORDER — DEXAMETHASONE SODIUM PHOSPHATE 10 MG/ML IJ SOLN
INTRAMUSCULAR | Status: DC | PRN
  Administered 2022-08-28: 10 mg via INTRAVENOUS

## 2022-08-28 MED ORDER — PHENYLEPHRINE 80 MCG/ML (10ML) SYRINGE FOR IV PUSH (FOR BLOOD PRESSURE SUPPORT)
PREFILLED_SYRINGE | INTRAVENOUS | Status: AC
  Filled 2022-08-28: qty 10

## 2022-08-28 MED ORDER — CEFAZOLIN SODIUM-DEXTROSE 2-4 GM/100ML-% IV SOLN
2.0000 g | INTRAVENOUS | Status: AC
  Administered 2022-08-28: 2 g via INTRAVENOUS
  Filled 2022-08-28: qty 100

## 2022-08-28 MED ORDER — MIDAZOLAM HCL 2 MG/2ML IJ SOLN
INTRAMUSCULAR | Status: AC
  Filled 2022-08-28: qty 2

## 2022-08-28 MED ORDER — PHENYLEPHRINE HCL-NACL 20-0.9 MG/250ML-% IV SOLN
INTRAVENOUS | Status: DC | PRN
  Administered 2022-08-28: 40 ug/min via INTRAVENOUS

## 2022-08-28 MED ORDER — PHENYLEPHRINE 80 MCG/ML (10ML) SYRINGE FOR IV PUSH (FOR BLOOD PRESSURE SUPPORT)
PREFILLED_SYRINGE | INTRAVENOUS | Status: DC | PRN
  Administered 2022-08-28: 160 ug via INTRAVENOUS
  Administered 2022-08-28: 80 ug via INTRAVENOUS

## 2022-08-28 MED ORDER — ENOXAPARIN SODIUM 40 MG/0.4ML IJ SOSY
40.0000 mg | PREFILLED_SYRINGE | Freq: Once | INTRAMUSCULAR | Status: AC
  Administered 2022-08-28: 40 mg via SUBCUTANEOUS
  Filled 2022-08-28: qty 0.4

## 2022-08-28 MED ORDER — PROMETHAZINE HCL 25 MG/ML IJ SOLN: 6.2500 mg | INTRAMUSCULAR | Status: DC | PRN

## 2022-08-28 MED ORDER — OXYCODONE HCL 5 MG PO TABS: 5.0000 mg | ORAL_TABLET | Freq: Once | ORAL | Status: DC | PRN

## 2022-08-28 MED ORDER — LIDOCAINE 2% (20 MG/ML) 5 ML SYRINGE
INTRAMUSCULAR | Status: DC | PRN
  Administered 2022-08-28: 30 mg via INTRAVENOUS

## 2022-08-28 MED ORDER — EPHEDRINE SULFATE-NACL 50-0.9 MG/10ML-% IV SOSY
PREFILLED_SYRINGE | INTRAVENOUS | Status: DC | PRN
  Administered 2022-08-28: 5 mg via INTRAVENOUS

## 2022-08-28 MED ORDER — LACTATED RINGERS IV SOLN: INTRAVENOUS | Status: DC

## 2022-08-28 MED ORDER — PROPOFOL 10 MG/ML IV BOLUS
INTRAVENOUS | Status: DC | PRN
  Administered 2022-08-28: 30 mg via INTRAVENOUS
  Administered 2022-08-28: 80 mg via INTRAVENOUS

## 2022-08-28 MED ORDER — ONDANSETRON HCL 4 MG/2ML IJ SOLN
INTRAMUSCULAR | Status: AC
  Filled 2022-08-28: qty 2

## 2022-08-28 MED ORDER — 0.9 % SODIUM CHLORIDE (POUR BTL) OPTIME
TOPICAL | Status: DC | PRN
  Administered 2022-08-28: 1000 mL

## 2022-08-28 MED ORDER — LIDOCAINE-EPINEPHRINE 1 %-1:100000 IJ SOLN
INTRAMUSCULAR | Status: AC
  Filled 2022-08-28: qty 1

## 2022-08-28 MED ORDER — FENTANYL CITRATE (PF) 100 MCG/2ML IJ SOLN: 25.0000 ug | INTRAMUSCULAR | Status: DC | PRN

## 2022-08-28 MED ORDER — ONDANSETRON HCL 4 MG/2ML IJ SOLN
INTRAMUSCULAR | Status: DC | PRN
  Administered 2022-08-28: 4 mg via INTRAVENOUS

## 2022-08-28 MED ORDER — MEPERIDINE HCL 25 MG/ML IJ SOLN: 6.2500 mg | INTRAMUSCULAR | Status: DC | PRN

## 2022-08-28 SURGICAL SUPPLY — 41 items
ADH SKN CLS APL DERMABOND .7 (GAUZE/BANDAGES/DRESSINGS) ×1
BAG COUNTER SPONGE SURGICOUNT (BAG) ×1 IMPLANT
BAG SPNG CNTER NS LX DISP (BAG) ×1
BINDER BREAST XXLRG (GAUZE/BANDAGES/DRESSINGS) IMPLANT
BLADE CLIPPER SURG (BLADE) IMPLANT
BLADE SURG 15 STRL LF DISP TIS (BLADE) ×1 IMPLANT
BLADE SURG 15 STRL SS (BLADE) ×1
CANISTER SUCT 3000ML PPV (MISCELLANEOUS) IMPLANT
CLEANER TIP ELECTROSURG 2X2 (MISCELLANEOUS) IMPLANT
DERMABOND ADVANCED .7 DNX12 (GAUZE/BANDAGES/DRESSINGS) IMPLANT
DRAPE LAPAROTOMY 100X72 PEDS (DRAPES) IMPLANT
DRAPE U-SHAPE 76X120 STRL (DRAPES) IMPLANT
DRSG TEGADERM 2-3/8X2-3/4 SM (GAUZE/BANDAGES/DRESSINGS) IMPLANT
ELECT CAUTERY BLADE 6.4 (BLADE) IMPLANT
ELECT COATED BLADE 2.86 ST (ELECTRODE) IMPLANT
ELECT NDL BLADE 2-5/6 (NEEDLE) IMPLANT
ELECT NEEDLE BLADE 2-5/6 (NEEDLE) IMPLANT
ELECT REM PT RETURN 9FT ADLT (ELECTROSURGICAL) ×1
ELECTRODE REM PT RTRN 9FT ADLT (ELECTROSURGICAL) ×1 IMPLANT
GAUZE 4X4 16PLY ~~LOC~~+RFID DBL (SPONGE) IMPLANT
GAUZE PAD ABD 8X10 STRL (GAUZE/BANDAGES/DRESSINGS) IMPLANT
GAUZE SPONGE 4X4 12PLY STRL LF (GAUZE/BANDAGES/DRESSINGS) ×1 IMPLANT
GLOVE BIO SURGEON STRL SZ 6.5 (GLOVE) ×2 IMPLANT
GOWN STRL REUS W/ TWL LRG LVL3 (GOWN DISPOSABLE) ×3 IMPLANT
GOWN STRL REUS W/TWL LRG LVL3 (GOWN DISPOSABLE) ×3
KIT BASIN OR (CUSTOM PROCEDURE TRAY) ×1 IMPLANT
NDL HYPO 25GX1X1/2 BEV (NEEDLE) ×1 IMPLANT
NEEDLE HYPO 25GX1X1/2 BEV (NEEDLE) ×1 IMPLANT
NS IRRIG 1000ML POUR BTL (IV SOLUTION) ×1 IMPLANT
PACK BASIC III (CUSTOM PROCEDURE TRAY) ×1
PACK SRG BSC III STRL LF ECLPS (CUSTOM PROCEDURE TRAY) ×1 IMPLANT
PENCIL BUTTON HOLSTER BLD 10FT (ELECTRODE) ×1 IMPLANT
SPIKE FLUID TRANSFER (MISCELLANEOUS) ×1 IMPLANT
SPONGE T-LAP 18X18 ~~LOC~~+RFID (SPONGE) IMPLANT
STRIP CLOSURE SKIN 1/2X4 (GAUZE/BANDAGES/DRESSINGS) IMPLANT
SYR BULB EAR ULCER 3OZ GRN STR (SYRINGE) ×1 IMPLANT
SYR BULB IRRIG 60ML STRL (SYRINGE) IMPLANT
SYR CONTROL 10ML LL (SYRINGE) ×1 IMPLANT
TOWEL GREEN STERILE FF (TOWEL DISPOSABLE) ×1 IMPLANT
TUBE CONNECTING 12X1/4 (SUCTIONS) ×1 IMPLANT
YANKAUER SUCT BULB TIP NO VENT (SUCTIONS) ×1 IMPLANT

## 2022-08-28 NOTE — Anesthesia Procedure Notes (Signed)
Procedure Name: Intubation Date/Time: 08/28/2022 7:34 AM  Performed by: Ester Rink, CRNAPre-anesthesia Checklist: Patient identified, Emergency Drugs available, Suction available and Patient being monitored Patient Re-evaluated:Patient Re-evaluated prior to induction Oxygen Delivery Method: Circle system utilized Preoxygenation: Pre-oxygenation with 100% oxygen Induction Type: IV induction Ventilation: Mask ventilation without difficulty Laryngoscope Size: Mac and 4 Grade View: Grade I Tube type: Oral Tube size: 7.0 mm Number of attempts: 1 Airway Equipment and Method: Stylet and Oral airway Placement Confirmation: ETT inserted through vocal cords under direct vision, positive ETCO2 and breath sounds checked- equal and bilateral Secured at: 22 cm Tube secured with: Tape Dental Injury: Teeth and Oropharynx as per pre-operative assessment

## 2022-08-28 NOTE — Op Note (Signed)
DATE OF OPERATION: 08/28/2022  LOCATION: [facility] Main operating Room  PREOPERATIVE DIAGNOSIS: Left breast mass  POSTOPERATIVE DIAGNOSIS: Same  PROCEDURE: Excision of left breast mass  SURGEON: Jeanann Lewandowsky, MD  ASSISTANT: Donnamarie Rossetti  EBL: 10 cc  CONDITION: Stable  COMPLICATIONS: None  INDICATION: The patient, [name], is a 77 y.o. female born on [birth year], is here for treatment of a mass in the left breast.  The patient did previously undergone a breast reduction after breast conserving therapy and radiation.  She had developed a mass in the left breast that she felt was concerning and I agreed.  The mass had not decreased or increased in size over the past month and I recommended to her that we remove the mass for pathologic diagnosis.   PROCEDURE DETAILS:  The patient was seen prior to surgery and marked.   IV antibiotics were given. The patient was taken to the operating room and given a general anesthetic. A standard time out was performed and all information was confirmed by those in the room. SCDs were placed.   The left breast was prepped and draped in the usual sterile manner a curvilinear incision was made at the lateral border of the nipple areolar complex and extended down to the vertical incision.  Dissection was carried out down into the subcutaneous tissues with the electrocautery.  The breast fat had very little vascularity to it and did not appear healthy.  The primary mass was at the lateral border of the nipple and extended underneath the nipple areolar complex I use electrocautery to dissected this away from the tissues.  The mass was removed and marked for orientation with a short suture at the superior aspect and long suture at the lateral aspect and a double suture at the deep aspect.  An additional margin was taken and all tissue was sent to pathology.  On evaluation of the remainder of the breast all of the tissue was somewhat firm and removal of all of the firm  breast tissue would have required mastectomy which I do not think was reasonable.  The wound was inspected for bleeding and meticulous hemostasis achieved with the electrocautery the breast tissue and skin were infiltrated with a mixture of 1% lidocaine with epinephrine and half percent Marcaine.  The dermal edges were approximated with interrupted 3-0 Monocryl sutures and the skin was closed with a running 4-0 Monocryl subcuticular stitch.  The incisions were sealed with Dermabond.  The patient was placed in a supportive garment. The patient was allowed to wake up and taken to recovery room in stable condition at the end of the case. The family was notified at the end of the case.  All instrument needle and sponge counts were reported as correct and there were no complications.  The advanced practice practitioner (APP) assisted throughout the case.  The APP was essential in retraction and counter traction when needed to make the case progress smoothly.  This retraction and assistance made it possible to see the tissue plans for the procedure.  The assistance was needed for blood control, tissue re-approximation and assisted with closure of the incision site.

## 2022-08-28 NOTE — Interval H&P Note (Signed)
History and Physical Interval Note: Pt met in pre op, no change in physical exam or indication for surgery. Surgical site marked with her concurrence. Will proceed with mass excision at her request  08/28/2022 6:57 AM  [name]  has presented today for surgery, with the diagnosis of Primary cancer of lower-inner quadrant of left female breast.  The various methods of treatment have been discussed with the patient and family. After consideration of risks, benefits and other options for treatment, the patient has consented to  Procedure(s): EXCISION TISSUE OF LEFT BREAST (Left) as a surgical intervention.  The patient's history has been reviewed, patient examined, no change in status, stable for surgery.  I have reviewed the patient's chart and labs.  Questions were answered to the patient's satisfaction.     Camillia Herter

## 2022-08-28 NOTE — Discharge Instructions (Addendum)
INSTRUCTIONS FOR AFTER BREAST SURGERY   You will likely have some questions about what to expect following your operation.  The following information will help you and your family understand what to expect when you are discharged from the hospital.  It is important to follow these guidelines to help ensure a smooth recovery and reduce complication.  Postoperative instructions include information on: diet, wound care, medications and physical activity.  AFTER SURGERY Expect to go home after the procedure.  In some cases, you may need to spend one night in the hospital for observation.   Activities as tolerated. You may get up and ambulate today. We encourage you to continue ambulation to prevent developing clots.   DIET Breast surgery does not require a specific diet.  However, the healthier you eat the better your body will heal. It is important to increasing your protein intake.  This means limiting the foods with sugar and carbohydrates.  Focus on vegetables and some meat.  If you have liposuction during your procedure be sure to drink water.  If your urine is bright yellow, then it is concentrated, and you need to drink more water.  As a general rule after surgery, you should have 8 ounces of water every hour while awake.  If you find you are persistently nauseated or unable to take in liquids let us know.  NO TOBACCO USE or EXPOSURE.  This will slow your healing process and lead to a wound.  WOUND CARE Leave the binder at all times except when showering . Use fragrance free soap like Dial, Dove or [place].   After 24 hours you can remove the binder to shower. Once dry apply binder or sports bra.   No baths, pools or hot tubs for four weeks. We close your incision to leave the smallest and best-looking scar. No ointment or creams on your incisions for four weeks.  No Neosporin (Too many skin reactions).  A few weeks after surgery you can use Mederma and start massaging the scar. We ask you to wear  your binder or sports bra for the first 6 weeks around the clock, including while sleeping. This provides added comfort and helps reduce the fluid accumulation at the surgery site. NO Ice or heating pads to the operative site.  You have a very high risk of a BURN before you feel the temperature change.  ACTIVITY No heavy lifting until cleared by the doctor.  This usually means no more than a half-gallon of milk.  It is OK to walk and climb stairs. Moving your legs is very important to decrease your risk of a blood clot.  It will also help keep you from getting deconditioned.  Every 1 to 2 hours get up and walk for 5 minutes. This will help with a quicker recovery back to normal.  Let pain be your guide so you don't do too much.  This time is for you to recover.  You will be more comfortable if you sleep and rest with your head elevated either with a few pillows under you or in a recliner.  No stomach sleeping for a three months.  WORK Everyone returns to work at different times. As a rough guide, most people take at least 1 - 2 weeks off prior to returning to work. If you need documentation for your job, give the forms to the front staff at the clinic.  DRIVING Arrange for someone to bring you home from the hospital after your surgery.  You  may be able to drive a few days after surgery but not while taking any narcotics or valium.  BOWEL MOVEMENTS Constipation can occur after anesthesia and while taking pain medication.  It is important to stay ahead for your comfort.  We recommend taking Milk of Magnesia (2 tablespoons; twice a day) while taking the pain pills.  MEDICATIONS You may be prescribed should start after surgery At your preoperative visit for you history and physical you may have been given the following medications: Zofran 4 mg:  This is to treat nausea and vomiting.  You can take this every 6 hours as needed and only if needed. Tramadol 50 mg:  This is only to be used after you have  taken the Motrin or the Tylenol. Every 8 hours as needed.   Over the counter Medication to take: Ibuprofen (Motrin) 600 mg:  Take this every 6 hours.  If you have additional pain then take 500 mg of the Tylenol every 8 hours.  Only take the Norco after you have tried these two. MiraLAX or Milk of Magnesia: Take this according to the bottle if you take the [place] Call your surgeon's office if any of the following occur: Fever 101 degrees F or greater Excessive bleeding or fluid from the incision site. Pain that increases over time without aid from the medications Redness, warmth, or pus draining from incision sites Persistent nausea or inability to take in liquids Severe misshapen area that underwent the operation.  Here are some resources for breast cancer patients:  Plastic surgery website: https://www.plasticsurgery.org/for-medical-professionals/education-and-resources/publications/breast-reconstruction-magazine Breast Reconstruction Awareness Campaign:  [employer] Plastic surgery Implant information:  https://www.plasticsurgery.org/patient-safety/breast-implant-safety

## 2022-08-28 NOTE — Anesthesia Postprocedure Evaluation (Signed)
Anesthesia Post Note  Patient: [name]  Procedure(s) Performed: EXCISION TISSUE OF LEFT BREAST (Left: Breast)     Patient location during evaluation: Phase II Anesthesia Type: General Level of consciousness: awake and alert, patient cooperative and oriented Pain management: pain level controlled Vital Signs Assessment: post-procedure vital signs reviewed and stable Respiratory status: spontaneous breathing, nonlabored ventilation and respiratory function stable Cardiovascular status: blood pressure returned to baseline and stable Postop Assessment: no apparent nausea or vomiting Anesthetic complications: no   There were no known notable events for this encounter.  Last Vitals:  Vitals:   08/28/22 0845 08/28/22 0855  BP: 133/86   Pulse: 66 65  Resp: (!) 27 11  Temp:  (!) 36.1 C  SpO2: 100% 97%    Last Pain:  Vitals:   08/28/22 0855  TempSrc:   PainSc: 0-No pain                 Esabella Stockinger,E. Joany Khatib

## 2022-08-28 NOTE — Transfer of Care (Signed)
Immediate Anesthesia Transfer of Care Note  Patient: [name]  Procedure(s) Performed: EXCISION TISSUE OF LEFT BREAST (Left: Breast)  Patient Location: PACU  Anesthesia Type:General  Level of Consciousness: drowsy and patient cooperative  Airway & Oxygen Therapy: Patient connected to nasal cannula oxygen  Post-op Assessment: Report given to RN and Post -op Vital signs reviewed and stable  Post vital signs: Reviewed and stable  Last Vitals:  Vitals Value Taken Time  BP 134/90 08/28/22 0834  Temp    Pulse 66 08/28/22 0834  Resp 12 08/28/22 0834  SpO2 99 % 08/28/22 0834  Vitals shown include unvalidated device data.  Last Pain:  Vitals:   08/28/22 0604  TempSrc:   PainSc: 0-No pain         Complications: There were no known notable events for this encounter.

## 2022-09-01 LAB — SURGICAL PATHOLOGY

## 2022-09-02 ENCOUNTER — Ambulatory Visit (INDEPENDENT_AMBULATORY_CARE_PROVIDER_SITE_OTHER): Payer: Medicare Other | Admitting: Plastic Surgery

## 2022-09-02 ENCOUNTER — Encounter: Payer: Self-pay | Admitting: Plastic Surgery

## 2022-09-02 VITALS — BP 127/76 | HR 87 | Ht 65.0 in | Wt 200.0 lb

## 2022-09-02 DIAGNOSIS — N6092 Unspecified benign mammary dysplasia of left breast: Secondary | ICD-10-CM

## 2022-09-02 DIAGNOSIS — Z9889 Other specified postprocedural states: Secondary | ICD-10-CM

## 2022-09-02 NOTE — Progress Notes (Signed)
[name] returns today 1 week postop from a left breast mass excision.  She is doing well with no complaints.  On physical exam her incision is clean dry and intact.  Pathology was remarkable only for a 0.1 mm focus of atypical ductal hyperplasia:  FINAL MICROSCOPIC DIAGNOSIS:   A. 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       Usual ductal hyperplasia (UHD).       Fibrocystic changes with dense stromal fibrosis, cysts and squamous  metaplasia.      Scaring with chronic inflammation, consistent with prior procedure  changes.      Negative for malignancy.       See comment.   B. 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       Dense stromal fibrosis.       Negative for malignancy.  Reassured [name] that there was no malignancy. Will ask Dr Dalbert Batman for follow up recommendations.  She will return to see me in 2-4 weeks

## 2022-09-09 ENCOUNTER — Encounter: Payer: Self-pay | Admitting: Gastroenterology

## 2022-09-09 ENCOUNTER — Ambulatory Visit (INDEPENDENT_AMBULATORY_CARE_PROVIDER_SITE_OTHER): Payer: Medicare Other | Admitting: Gastroenterology

## 2022-09-09 VITALS — BP 108/70 | HR 70 | Ht 65.0 in | Wt 208.0 lb

## 2022-09-09 DIAGNOSIS — K219 Gastro-esophageal reflux disease without esophagitis: Secondary | ICD-10-CM

## 2022-09-09 DIAGNOSIS — R159 Full incontinence of feces: Secondary | ICD-10-CM | POA: Diagnosis not present

## 2022-09-09 DIAGNOSIS — R152 Fecal urgency: Secondary | ICD-10-CM | POA: Diagnosis not present

## 2022-09-09 DIAGNOSIS — R197 Diarrhea, unspecified: Secondary | ICD-10-CM

## 2022-09-09 NOTE — Patient Instructions (Signed)
You have been referred to [facility] pelvic floor physical therapy. They will contact you directly to schedule a office appointment.   Continue current medications.   The [facility] providers would like to encourage you to use [facility] to communicate with providers for non-urgent requests or questions.  Due to long hold times on the telephone, sending your provider a message by [facility] may be a faster and more efficient way to get a response.  Please allow 48 business hours for a response.  Please remember that this is for non-urgent requests.   Thank you for choosing me and [facility].  Venita Lick. Pleas Koch., MD., Clementeen Graham

## 2022-09-09 NOTE — Progress Notes (Signed)
    Assessment     Diarrhea with intermittent fecal incontinence GERD with history of an esophageal stricture and a medium sized hiatal hernia   Recommendations    Continue dicyclomine, pantoprazole, famotidine, Imodium Follow antireflux measures Reviewed low FODMAP diet Kegel exercises 5 times daily Pelvic floor PT referral REV in 1 year   HPI    This is a 77 year old female with a long history of intermittent diarrhea and over the past few years it has been associated with intermittent fecal incontinence.  She has GERD with a history of an esophageal stricture.  She has intermittent episodes when her diarrhea is more severe and fecal incontinence worsens.  At other times her diarrhea is under better control.  She has not determined any particular dietary stressors.  She states she discontinues Mg oxide when her diarrhea worsens.  Her reflux symptoms are fairly well-controlled except for frequent nighttime regurgitation.  She denies dysphagia.  Her appetite is good and her weight is stable.  She denies rectal bleeding.   Labs / Imaging       Latest Ref Rng & Units 08/04/2022    3:42 PM 06/02/2022    3:15 PM 09/25/2021   10:00 AM  Hepatic Function  Total Protein 6.0 - 8.3 g/dL 7.5  7.2  7.8   Albumin 3.5 - 5.2 g/dL 4.0  3.7  4.0   AST 0 - 37 U/L 30  34  31   ALT 0 - 35 U/L 20  21  19    Alk Phosphatase 39 - 117 U/L 76  67  66   Total Bilirubin 0.2 - 1.2 mg/dL 0.7  0.8  0.7        Latest Ref Rng & Units 08/04/2022    3:42 PM 06/02/2022    3:15 PM 09/25/2021   10:00 AM  CBC  WBC 4.0 - 10.5 K/uL 4.9  5.0  4.3   Hemoglobin 12.0 - 15.0 g/dL 10.1  9.4  9.2   Hematocrit 36.0 - 46.0 % 32.9  28.4  28.8   Platelets 150.0 - 400.0 K/uL 233.0  268.0  221.0    Current Medications, Allergies, Past Medical History, Past Surgical History, Family History and Social History were reviewed in [employer] record.   Physical Exam: General: Well developed, well nourished, no  acute distress Head: Normocephalic and atraumatic Eyes: Sclerae anicteric, EOMI Ears: Normal auditory acuity Mouth: No deformities or lesions noted Lungs: Clear throughout to auscultation Heart: Regular rate and rhythm; No murmurs, rubs or bruits Abdomen: Soft, non tender and non distended. No masses, hepatosplenomegaly or hernias noted. Normal Bowel sounds Rectal: Not done Musculoskeletal: Symmetrical with no gross deformities  Pulses:  Normal pulses noted Extremities: No edema or deformities noted Neurological: Alert oriented x 4, grossly nonfocal Psychological:  Alert and cooperative. Normal mood and affect   Vickee Mormino T. Russella Dar, MD 09/09/2022, 10:48 AM

## 2022-09-10 DIAGNOSIS — N1831 Chronic kidney disease, stage 3a: Secondary | ICD-10-CM | POA: Diagnosis not present

## 2022-09-10 DIAGNOSIS — D631 Anemia in chronic kidney disease: Secondary | ICD-10-CM | POA: Diagnosis not present

## 2022-09-10 DIAGNOSIS — N2581 Secondary hyperparathyroidism of renal origin: Secondary | ICD-10-CM | POA: Diagnosis not present

## 2022-09-10 DIAGNOSIS — N189 Chronic kidney disease, unspecified: Secondary | ICD-10-CM | POA: Diagnosis not present

## 2022-09-10 DIAGNOSIS — I129 Hypertensive chronic kidney disease with stage 1 through stage 4 chronic kidney disease, or unspecified chronic kidney disease: Secondary | ICD-10-CM | POA: Diagnosis not present

## 2022-09-11 LAB — LAB REPORT - SCANNED
Albumin, Urine POC: 8.7
Creatinine, POC: 178 mg/dL
EGFR: 47
Microalb Creat Ratio: 5
Protein/Creatinine Ratio: 111

## 2022-09-15 NOTE — Progress Notes (Signed)
Patient is a 77 year old female who recently underwent excision of left breast mass with Dr. Ladona Ridgel on 08/28/2022.  Patient is approximately 2-1/2 weeks postop.  She presents to the clinic today for postoperative follow-up.  Patient was most recently seen in the clinic on 09/02/2022.  At this visit, patient reported she was doing well.  She had no new complaints.  Her incision was clean, dry and intact on exam.  Pathology was remarkable for only 0.1 mm focus of atypical ductal hyperplasia.  Plan was for patient to return in 2 to 4 weeks.  Further recommendations are pending from Dr. [name].  Today, patient reports she is doing well.  She reports she sometimes has a little bit of pain where her sports bra is hitting against her incision.  She denies any other issues or concerns.  She denies any fevers or chills.  Chaperone present on exam.  On exam, patient is sitting upright in no acute distress.  Incision is clean, dry and intact.  It appears to be healing well.  There is a suture knot noted, which was cut and removed.  Patient tolerated well.  There is no overlying erythema to the breast or fluid collections palpated on exam.  There are no signs of infection on exam.  I discussed with the patient that would like her to continue Vaseline for the next few days or so, and then transition to scar creams.  Patient expressed understanding.  I discussed with the patient that we are awaiting further recommendations in regards to her pathology report from general surgery to see if any further intervention is needed.  Patient expressed understanding.  I discussed with the patient would like her to continue compression at all times with a sports bra.  I discussed with her she may apply some padding such as an ABD pad in between the sports bra on her incision for comfort.  Patient expressed understanding.  Patient to return in a few weeks at her scheduled follow-up appointment for reevaluation.  I instructed the  patient to call in the meantime if she has any questions or concerns about anything.  I discussed the case with Dr. Ladona Ridgel.  He reached out to general surgery, and Dr. [name] has since retired since seeing the patient.  We will await further recommendations from general surgery.

## 2022-09-16 ENCOUNTER — Ambulatory Visit (INDEPENDENT_AMBULATORY_CARE_PROVIDER_SITE_OTHER): Payer: Medicare Other | Admitting: Student

## 2022-09-16 DIAGNOSIS — N6489 Other specified disorders of breast: Secondary | ICD-10-CM

## 2022-09-16 DIAGNOSIS — Z9889 Other specified postprocedural states: Secondary | ICD-10-CM

## 2022-09-16 DIAGNOSIS — N6092 Unspecified benign mammary dysplasia of left breast: Secondary | ICD-10-CM

## 2022-09-29 ENCOUNTER — Telehealth: Payer: Self-pay | Admitting: Internal Medicine

## 2022-09-29 ENCOUNTER — Telehealth: Payer: Self-pay | Admitting: Family Medicine

## 2022-09-29 DIAGNOSIS — E890 Postprocedural hypothyroidism: Secondary | ICD-10-CM

## 2022-09-29 MED ORDER — FUROSEMIDE 40 MG PO TABS: 40.0000 mg | ORAL_TABLET | Freq: Every day | ORAL | 1 refills | Status: DC

## 2022-09-29 MED ORDER — LEVOTHYROXINE SODIUM 175 MCG PO TABS: ORAL_TABLET | ORAL | 1 refills | Status: DC

## 2022-09-29 MED ORDER — POTASSIUM CHLORIDE CRYS ER 20 MEQ PO TBCR: 20.0000 meq | EXTENDED_RELEASE_TABLET | Freq: Every day | ORAL | 1 refills | Status: DC

## 2022-09-29 MED ORDER — MAGNESIUM OXIDE 400 MG PO TABS: 400.0000 mg | ORAL_TABLET | Freq: Two times a day (BID) | ORAL | 1 refills | Status: DC

## 2022-09-29 NOTE — Telephone Encounter (Signed)
[facility] to [facility]Marland KitchenRaechel Chute

## 2022-09-29 NOTE — Telephone Encounter (Signed)
[facility]

## 2022-09-29 NOTE — Telephone Encounter (Signed)
Prescription Request  09/29/2022  LOV: 08/18/2022  What is the name of the medication or equipment? furosemide (LASIX) 40 MG tablet  levothyroxine (SYNTHROID) 175 MCG tablet  magnesium oxide (MAG-OX) 400 MG tablet  potassium chloride SA (KLOR-CON M) 20 MEQ tablet   Have you contacted your pharmacy to request a refill? Yes   Which pharmacy would you like this sent to?  [facility] - [place], [place] - [address] [address] [place] [address] Phone: [phone] Fax: [phone]    Patient notified that their request is being sent to the clinical staff for review and that they should receive a response within 2 business days.   Please advise at [facility] [phone]

## 2022-09-30 ENCOUNTER — Ambulatory Visit (INDEPENDENT_AMBULATORY_CARE_PROVIDER_SITE_OTHER): Payer: Medicare Other | Admitting: Student

## 2022-09-30 DIAGNOSIS — Z9889 Other specified postprocedural states: Secondary | ICD-10-CM

## 2022-09-30 NOTE — Progress Notes (Signed)
Patient is a 77 year old female who underwent excision of left breast mass with Dr. Ladona Ridgel on 08/28/2022.  She is 4-1/2 weeks postop.  She presents to the clinic today for postoperative follow-up.  Patient was last seen in the clinic on 09/16/2022.  At this visit, patient was doing well.  On exam, her incision was clean dry and intact and appear to be healing well.  Plan was for her to apply Vaseline to her incision daily and then transition to scar creams.  It was also discussed with the patient that we would reach out to general surgery for further recommendations for her pathology.  Today, patient reports she is doing well.  She denies any issues or concerns at this time.  She denies any redness, drainage, fevers or chills from the surgical site.  Chaperone present on exam.  On exam, patient is sitting upright in no acute distress.  Left breast is overall soft with some firmness to the inferior aspect consistent with scarring versus fat necrosis.  There is no overlying erythema to the breast.  There are no fluid collections palpated on exam.  Vertical limb incision has healed really nicely.  There are no wounds noted on exam.  I discussed with the patient that we reached out to [facility] surgery in regards to her pathology.  There is no further intervention needed. Encouraged patient to make sure she gets her routine mammogram screenings. Patient expressed understanding.  I discussed with the patient that she should continue to wear compression for another week or 2, and then she may transition to regular bras without underwire.  I also discussed with the patient she may start gradually increasing her activities.  Patient expressed understanding.  I discussed with the patient she may apply scar creams to her incision at this time if she would like.  Patient reports that she and Dr. Ladona Ridgel had talked about possible further procedures such as fat grafting to her left breast.  Will have the  patient follow-up with Dr. Ladona Ridgel in 1 month so that they may discuss possible further interventions.  Patient was in agreement with this plan.  Patient in the meantime is to call if she has any questions or concerns.

## 2022-10-01 MED ORDER — COLCHICINE 0.6 MG PO TABS: 0.6000 mg | ORAL_TABLET | Freq: Every day | ORAL | 2 refills | Status: AC | PRN

## 2022-10-01 NOTE — Telephone Encounter (Signed)
Refill request sent electronically.  Please let me know if this does not work.

## 2022-10-05 DIAGNOSIS — N1831 Chronic kidney disease, stage 3a: Secondary | ICD-10-CM | POA: Diagnosis not present

## 2022-10-21 ENCOUNTER — Other Ambulatory Visit: Payer: Self-pay | Admitting: Internal Medicine

## 2022-10-21 DIAGNOSIS — Z1231 Encounter for screening mammogram for malignant neoplasm of breast: Secondary | ICD-10-CM

## 2022-10-27 ENCOUNTER — Encounter: Payer: Self-pay | Admitting: Plastic Surgery

## 2022-10-27 ENCOUNTER — Ambulatory Visit (INDEPENDENT_AMBULATORY_CARE_PROVIDER_SITE_OTHER): Payer: Medicare Other | Admitting: Plastic Surgery

## 2022-10-27 ENCOUNTER — Ambulatory Visit: Payer: Medicare Other | Admitting: Internal Medicine

## 2022-10-27 VITALS — BP 107/70 | HR 74

## 2022-10-27 DIAGNOSIS — C50312 Malignant neoplasm of lower-inner quadrant of left female breast: Secondary | ICD-10-CM

## 2022-10-27 DIAGNOSIS — N6489 Other specified disorders of breast: Secondary | ICD-10-CM

## 2022-10-27 NOTE — Progress Notes (Deleted)
Patient ID: [name], female   DOB: [birth year], 77 y.o.   MRN: [id number]  HPI: [name] is a 77 y.o.-year-old female, returning for follow-up for DM2, dx in 2012, non-insulin-dependent, controlled, with complications (gastroparesis, peripheral neuropathy), also post ablative hypothyroidism, multinodular goiter. Pt. previously saw Dr. Everardo [name], but last visit with me 6 months ago.  Interim history: No increased urination, blurry vision, nausea, chest pain.  Reviewed HbA1c: Lab Results  Component Value Date   HGBA1C 5.6 04/28/2022   HGBA1C 6.1 (A) 08/28/2021   HGBA1C 5.5 05/29/2021   HGBA1C 5.6 02/27/2021   HGBA1C 5.6 08/20/2020   HGBA1C 5.6 12/18/2019   HGBA1C 5.8 (A) 10/13/2019   HGBA1C 5.5 08/21/2019   HGBA1C 5.5 06/16/2019   HGBA1C 5.5 03/17/2018   Patient's diabetes is diet controlled.  Pt is not checking her sugars, she does not have a meter. - am: n/c - 2h after b'fast: n/c - before lunch: n/c - 2h after lunch: n/c - before dinner: n/c - 2h after dinner: n/c - bedtime: n/c - nighttime: n/c  - no CKD, last BUN/creatinine:  Lab Results  Component Value Date   BUN 19 08/04/2022   BUN 15 06/02/2022   CREATININE 1.08 08/04/2022   CREATININE 1.06 06/02/2022   -+ HL; last set of lipids: Lab Results  Component Value Date   CHOL 159 04/28/2022   HDL 73.10 04/28/2022   LDLCALC 60 04/28/2022   LDLDIRECT 53.0 10/29/2015   TRIG 132.0 04/28/2022   CHOLHDL 2 04/28/2022  On lovastatin 20 mg daily.  - last eye exam was in 2022. No DR reportedly.   - No numbness and tingling in her feet - resolved.   She was on Lyrica 75 mg daily and Elavil 50 mg daily, previously.  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  Pt is on levothyroxine 175 mcg daily, taken: - in am - fasting - at least 30 min from b'fast - + calcium carbonate later in the day - + iron in am along with LT4 >> moved before lunch - no multivitamins - +  PPIs: Protonix 40 mg daily, also on Pepcid 40 mg daily - after lunch and dinner - not on Biotin  Reviewed her TSH levels: Lab Results  Component Value Date   TSH 4.90 06/02/2022   TSH 4.66 04/28/2022   TSH 3.30 08/22/2021   TSH 0.32 (L) 02/27/2021   TSH 4.49 08/20/2020   TSH 12.91 (H) 12/18/2019   TSH 0.08 (L) 08/17/2019   TSH 0.34 (L) 07/17/2019   TSH 8.00 (H) 06/16/2019   TSH 0.47 12/27/2018   TSH 0.73 03/17/2018   TSH 28.21 (H) 02/16/2018   TSH 2.37 06/28/2017   TSH 1.13 05/01/2016   TSH 3.56 12/11/2015   TSH 7.57 (H) 10/29/2015   TSH 0.41 04/20/2014   TSH 0.83 08/28/2013   TSH 41.669 (H) 08/03/2013   TSH 13.621 (H) 05/11/2013   Multinodular goiter: -Stable, small, thyroid nodules per latest thyroid ultrasound from 08/2021:  Thyroid U/S (09/08/2021): Parenchymal Echotexture: Moderately heterogenous  Isthmus: 4 mm  Right lobe: 2.7 x 1.0 x 1.1 cm  Left lobe: 2.9 x 1.1 x 1.5 cm  _________________________________________________________   Estimated total number of nodules >/= 1 cm: 1 _________________________________________________________   Stable 6 mm solid isoechoic TR 3 type nodule in the right mid thyroid. No follow-up will be recommended.   The previously biopsy left mid thyroid solid hypoechoic TR 4 nodule is similar in appearance  in size measuring 1.5 x 0.9 x 0.9 cm, previously 1.4 x 0.8 x 0.6 cm. Correlate with prior pathology.   Stable background thyroid heterogeneity and atrophy. No hypervascularity. No regional adenopathy.   IMPRESSION: Stable 1.5 cm left mid thyroid TR 4 nodule, previously biopsied. Stable right mid thyroid subcentimeter TR 3 nodule. No new or enlarging thyroid nodule.  Pt denies: - feeling nodules in neck - hoarseness - choking She had Es stretching in the past - not in a long time. She has dysphagia and hoarseness. She has GERD.  No family history of thyroid cancer or personal history of radiation therapy to head or  neck.  She also has a history of HTN, iron deficiency anemia, B12 deficiency, vitamin D deficiency, breast cancer, gout, osteoarthritis, increased LFTs, DJD.  ROS: + see HPI  Past Medical History:  Diagnosis Date   Allergy    ANEMIA, IRON DEFICIENCY 05/08/2009   Angina    ASYMPTOMATIC POSTMENOPAUSAL STATUS 10/11/2008   Blood transfusion    Blood transfusion without reported diagnosis    Breast cancer (HCC) 09/29/2011   invasive grade III ductal ca,assoc high grade dcis,ER/PR=neg; left breast   C. difficile colitis    Cataract    Diverticulosis of colon (without mention of hemorrhage)    DVT (deep venous thrombosis) (HCC)    08/21/22 "YEARS AGO"   Esophageal reflux 06/12/2008   Gastroparesis    GOITER, MULTINODULAR 04/02/2009   Gout, unspecified    H/O hiatal hernia    History of kidney stones    History of lower GI bleeding    History of radiation therapy 02/08/12-03/25/12   left breast,total 61gy   Hypokalemia 05/11/2013   Hypomagnesemia    HYPOTHYROIDISM, POST-RADIATION 08/13/2009   Internal hemorrhoids without mention of complication    Kidney stones    "several"   Leukopenia    Migraines    Neuropathy    Obesity    On supplemental oxygen therapy    at night while sleeping - patient did not want to undergo sleep study   Osteoarthrosis, unspecified whether generalized or localized, unspecified site    Other and unspecified hyperlipidemia    Personal history of chemotherapy 2013   Personal history of radiation therapy 2013   left   PONV (postoperative nausea and vomiting)    PUD (peptic ulcer disease)    Short bowel syndrome    Shortness of breath on exertion    "sometimes"   Stricture and stenosis of esophagus    Thyrotoxicosis without mention of goiter or other cause, without mention of thyrotoxic crisis or storm    Type II or unspecified type diabetes mellitus without mention of complication, not stated as uncontrolled    no med in years diet controled    Unspecified essential hypertension    UTI (urinary tract infection)    Varicose veins    VITAMIN B12 DEFICIENCY 08/30/2009   Past Surgical History:  Procedure Laterality Date   ABDOMINAL ADHESION SURGERY  1980's thru 1990's   "several"   ABDOMINAL HYSTERECTOMY  1970's   with BSO   BREAST BIOPSY Left 08/13/11   left breast lower inner quadrant   BREAST BIOPSY Right 1985   Rt exc bx, benign   BREAST LUMPECTOMY Left 08/2011   BREAST LUMPECTOMY W/ NEEDLE LOCALIZATION  09/29/11   left  breast=lymph node,excision benign/ ER/PR=neg, her 2 Positive   BREAST REDUCTION SURGERY Bilateral 06/09/2021   Procedure: MAMMARY REDUCTION  (BREAST);  Surgeon: Allena Napoleon, MD;  Location:  [facility] OR;  Service: Plastics;  Laterality: Bilateral;  2 hours   BUNIONECTOMY  1970's   bilateral   CHOLECYSTECTOMY  1990's   COLON SURGERY     "several surgeries for short bowel syndrome"   COLONOSCOPY  2012   multiple    DILATION AND CURETTAGE OF UTERUS     ESOPHAGOGASTRODUODENOSCOPY  2011   multiple    EXCISIONAL HEMORRHOIDECTOMY  11/10/2016   EYE SURGERY     "long time ago"   FLEXIBLE SIGMOIDOSCOPY  2011   multiple    KIDNEY STONE SURGERY  1990's   "tried to go up & get it but pushed it further up"   LITHOTRIPSY     "4 or 5 times"   MASS EXCISION Left 08/28/2022   Procedure: EXCISION TISSUE OF LEFT BREAST;  Surgeon: Santiago Glad, MD;  Location: [facility] OR;  Service: Plastics;  Laterality: Left;   MASTECTOMY W/ NODES PARTIAL  09/29/11   left   PORT-A-CATH REMOVAL Right 12/19/2013   Procedure: MINOR REMOVAL PORT-A-CATH;  Surgeon: Ernestene Mention, MD;  Location: [facility];  Service: General;  Laterality: Right;   PORTACATH PLACEMENT  09/29/2011   Procedure: INSERTION PORT-A-CATH;  Surgeon: Ernestene Mention, MD;  Location[facility] OR;  Service: General;  Laterality: N/A;   SMALL INTESTINE SURGERY     Thyroid Ultrasound  12/1994 and 12/1995   TOTAL KNEE ARTHROPLASTY Right 06/05/2015   Procedure: RIGHT  TOTAL KNEE ARTHROPLASTY;  Surgeon: Loreta Ave, MD;  Location: [facility] OR;  Service: Orthopedics;  Laterality: Right;   UPPER GASTROINTESTINAL ENDOSCOPY     VEIN LIGATION AND STRIPPING  1980's   Right leg   Social History   Socioeconomic History   Marital status: Widowed    Spouse name: Not on file   Number of children: 2   Years of education: 10   Highest education level: 10th grade  Occupational History   Occupation: RETIRED  Tobacco Use   Smoking status: Former    Packs/day: 1.00    Years: 10.00    Additional pack years: 0.00    Total pack years: 10.00    Types: Cigarettes    Quit date: 09/22/1985    Years since quitting: 37.1   Smokeless tobacco: Never  Vaping Use   Vaping Use: Never used  Substance and Sexual Activity   Alcohol use: Yes    Comment: maybe 2 glasses of wine a month   Drug use: Never   Sexual activity: Not Currently    Birth control/protection: Surgical    Comment: 1st intercourse- 15, partners- 5  Other Topics Concern   Not on file  Social History Narrative   Not on file   Social Determinants of Health   Financial Resource Strain: High Risk (01/09/2022)   Overall Financial Resource Strain (CARDIA)    Difficulty of Paying Living Expenses: Very hard  Food Insecurity: No Food Insecurity (01/09/2022)   Hunger Vital Sign    Worried About Running Out of Food in the Last Year: Never true    Ran Out of Food in the Last Year: Never true  Transportation Needs: No Transportation Needs (01/09/2022)   PRAPARE - Administrator, Civil Service (Medical): No    Lack of Transportation (Non-Medical): No  Physical Activity: Inactive (01/09/2022)   Exercise Vital Sign    Days of Exercise per Week: 0 days    Minutes of Exercise per Session: 0 min  Stress: No Stress Concern Present (01/09/2022)  [employer] of Occupational Health - Occupational Stress Questionnaire    Feeling of Stress : Not at [name]  Social Connections: Moderately Integrated  (01/09/2022)   Social Connection and Isolation Panel [NHANES]    Frequency of Communication with Friends and Family: More than three times a week    Frequency of Social Gatherings with Friends and Family: Not on file    Attends Religious Services: More than 4 times per year    Active Member of [employer] or Organizations: Yes    Attends Banker Meetings: More than 4 times per year    Marital Status: Widowed  Intimate Partner Violence: Not At Risk (01/09/2022)   Humiliation, Afraid, Rape, and Kick questionnaire    Fear of Current or Ex-Partner: No    Emotionally Abused: No    Physically Abused: No    Sexually Abused: No   Current Outpatient Medications on File Prior to Visit  Medication Sig Dispense Refill   albuterol (VENTOLIN HFA) 108 (90 Base) MCG/ACT inhaler Inhale 2 puffs into the lungs every 6 (six) hours as needed for wheezing or shortness of breath. 54 g 4   allopurinol (ZYLOPRIM) 100 MG tablet Take 2 tablets (200 mg total) by mouth daily. 180 tablet 3   amitriptyline (ELAVIL) 50 MG tablet TAKE ONE TABLET BY MOUTH EVERY DAY AT BEDTIME (USE CAUTION - MAY CAUSE DROWSINESS) 90 tablet 3   Calcium Carbonate-Vitamin D (CALCIUM-VITAMIN D) 600-200 MG-UNIT CAPS Take 1 capsule by mouth daily.     cholecalciferol (VITAMIN D) 25 MCG (1000 UNIT) tablet Take 1,000 Units by mouth daily.     colchicine 0.6 MG tablet Take 1 tablet (0.6 mg total) by mouth daily as needed (gout or psuedogout pain). 90 tablet 2   Cyanocobalamin (VITAMIN B 12 PO) Take 1,000 mcg by mouth daily.     dicyclomine (BENTYL) 20 MG tablet Take 1 tablet (20 mg total) by mouth 3 (three) times daily before meals. 270 tablet 11   famotidine (PEPCID) 40 MG tablet Take 1 tablet (40 mg total) by mouth daily. 90 tablet 3   ferrous sulfate 325 (65 FE) MG tablet Take 1 tablet (325 mg total) by mouth daily with breakfast. (Patient taking differently: Take 325 mg by mouth daily as needed (Anemia).) 90 tablet 0   fluticasone (FLONASE)  50 MCG/ACT nasal spray Place 2 sprays into both nostrils daily. (Patient taking differently: Place 2 sprays into both nostrils daily as needed for allergies.) 48 g 3   furosemide (LASIX) 40 MG tablet Take 1 tablet (40 mg total) by mouth daily. 90 tablet 1   levothyroxine (SYNTHROID) 175 MCG tablet TAKE ONE TABLET BY MOUTH EVERY DAY BEFORE BREAKFAST. 90 tablet 1   loratadine (CLARITIN) 10 MG tablet Take 1 tablet (10 mg total) by mouth daily. (Patient taking differently: Take 10 mg by mouth daily as needed for allergies.) 90 tablet 1   lovastatin (MEVACOR) 20 MG tablet TAKE ONE TABLET BY MOUTH EVERY DAY AT 6:00PM 90 tablet 3   magnesium oxide (MAG-OX) 400 MG tablet Take 1 tablet (400 mg total) by mouth 2 (two) times daily. 180 tablet 1   Multiple Vitamins-Minerals (MULTIVITAMIN WOMENS 50+ ADV PO) Take 1 tablet by mouth daily.     nitroGLYCERIN (NITROSTAT) 0.4 MG SL tablet Place 1 tablet (0.4 mg total) under the tongue every 5 (five) minutes as needed. 25 tablet 6   ondansetron (ZOFRAN) 4 MG tablet Take 1 tablet (4 mg total) by mouth every 8 (eight) hours as  needed for up to 20 doses for nausea or vomiting. 20 tablet 0   pantoprazole (PROTONIX) 40 MG tablet TAKE 1 TABLET BY MOUTH 30 MINUTES PRIOR TO BREAKFAST AND SUPPER 180 tablet 1   potassium chloride SA (KLOR-CON M) 20 MEQ tablet Take 1 tablet (20 mEq total) by mouth daily. 90 tablet 1   pregabalin (LYRICA) 75 MG capsule Take 75 mg by mouth 2 (two) times daily.     traMADol (ULTRAM) 50 MG tablet Take 1 tablet (50 mg total) by mouth every 8 (eight) hours as needed for up to 15 doses for moderate pain or severe pain. 15 tablet 0   umeclidinium-vilanterol (ANORO ELLIPTA) 62.5-25 MCG/ACT AEPB Inhale 1 puff into the lungs daily. 180 each 4   No current facility-administered medications on file prior to visit.   Allergies  Allergen Reactions   Aspirin Other (See Comments)    REACTION: Gi Intolerance/ Burning in stomach   Trazodone And Nefazodone  Nausea And Vomiting    "sick"   Flagyl [Metronidazole] Rash   Morphine And Codeine Itching   Family History  Problem Relation Age of Onset   Esophageal cancer Son        deceased   Breast cancer Son        esophageal   Diabetes Mother    Heart disease Mother    Kidney disease Sister    Diabetes Father    Hypertension Father    Kidney disease Brother        x 3   Colon cancer Paternal Uncle    Breast cancer Maternal Aunt    Breast cancer Paternal Aunt    Breast cancer Cousin        Pt states she has 15+ cousins w/ Breast CA   Rectal cancer Neg Hx    Stomach cancer Neg Hx    PE: There were no vitals taken for this visit. Wt Readings from Last 3 Encounters:  09/09/22 208 lb (94.3 kg)  09/02/22 200 lb (90.7 kg)  08/28/22 209 lb (94.8 kg)   Constitutional: overweight, in NAD Eyes:  EOMI, no exophthalmos ENT: no neck masses, no cervical lymphadenopathy Cardiovascular: RRR, No MRG Respiratory: CTA B Musculoskeletal: no deformities Skin:no rashes Neurological: no tremor with outstretched hands  ASSESSMENT: 1. DM2, non-insulin-dependent, controlled, with long-term complications - Gastroparesis - PN  2.  Post ablative hypothyroidism  3.  Multinodular goiter  PLAN:  1. Patient with longstanding, uncontrolled, type 2 diabetes, diet controlled.  HbA1c at last visit was 5.6%, lower, excellent.  She was not checking sugars at last visit and did not have a meter.  We discussed about the importance of starting to check sugars at least once a day and whenever she felt poorly.  We sent a prescription for meter and supplies to her pharmacy.  We also discussed about targets for blood sugars.  However, based on the HbA1c, she did not require any medications at that time.  -I advised her to: Patient Instructions  Please continue Levothyroxine 175 mcg daily.  Take the thyroid hormone every day, with water, at least 30 minutes before breakfast, separated by at least 4 hours from: -  acid reflux medications - calcium - iron - multivitamins  Move the Iron to before lunch.  Please stop at the lab.  Please return in 6 months with your sugar log.   - we checked her HbA1c: 7%  - advised to check sugars at different times of the day - 1x a day,  rotating check times - advised for yearly eye exams >> she is UTD - return to clinic in 6 months  2.  Post ablative hypothyroidism - latest thyroid labs reviewed with pt. >> normal: Lab Results  Component Value Date   TSH 4.90 06/02/2022  - she continues on LT4 175 mcg daily - pt feels good on this dose. - we discussed about taking the thyroid hormone every day, with water, >30 minutes before breakfast, separated by >4 hours from acid reflux medications, calcium, iron, multivitamins. Pt. is taking it correctly now -at last visit, she was taking iron along with LT4.  I advised her to move iron before lunch - will check thyroid tests today: TSH and fT4 - If labs are abnormal, she will need to return for repeat TFTs in 1.5 months  3.  Multinodular goiter -She has a history of dysphagia and hoarseness, most likely related to her acid reflux and esophageal strictures.  These were stretched in the past -Latest thyroid ultrasound report from 09/08/2021 was reviewed: Stable, 1.5 cm left mid thyroid nodule with stable heterogeneity and atrophy of the rest of the gland.  Reviewing Dr. George Hugh note, this nodule was previously biopsied.  She also has a small, stable, 6 mm right mid thyroid nodule, for which no follow-up is needed -Will continue to follow her expectantly for now  Carlus Pavlov, MD PhD [facility] End[facility]

## 2022-10-27 NOTE — Progress Notes (Signed)
Good Ms. Brohl returns today for evaluation.  She has previously undergone excision of the mass in the left breast due to concerns that it was not decreasing in size.  The mass returned only with a microscopic focus of atypical ductal hyperplasia which will be evaluated with mammograms.  She is still concerned with the volume difference on the left.  There is a significant amount of asymmetry between the breasts.  We discussed the option of fat grafting to try to restore the volume on the left.  She understands that she will need multiple rounds of fat grafting to be able to match the volume on the right.  She also understands that it may not be possible to ever completely match the right side.  Will schedule her for fat grafting for reconstruction.  Patient has several family events at the end of August and September.  Will try to avoid this.  Time for her surgery.

## 2022-10-29 DIAGNOSIS — I129 Hypertensive chronic kidney disease with stage 1 through stage 4 chronic kidney disease, or unspecified chronic kidney disease: Secondary | ICD-10-CM | POA: Diagnosis not present

## 2022-10-29 DIAGNOSIS — N2581 Secondary hyperparathyroidism of renal origin: Secondary | ICD-10-CM | POA: Diagnosis not present

## 2022-10-29 DIAGNOSIS — D631 Anemia in chronic kidney disease: Secondary | ICD-10-CM | POA: Diagnosis not present

## 2022-10-29 DIAGNOSIS — N1831 Chronic kidney disease, stage 3a: Secondary | ICD-10-CM | POA: Diagnosis not present

## 2022-11-04 ENCOUNTER — Encounter: Payer: Medicare Other | Admitting: Plastic Surgery

## 2022-11-09 ENCOUNTER — Ambulatory Visit (INDEPENDENT_AMBULATORY_CARE_PROVIDER_SITE_OTHER): Payer: Medicare Other | Admitting: Podiatry

## 2022-11-09 ENCOUNTER — Encounter: Payer: Self-pay | Admitting: Podiatry

## 2022-11-09 DIAGNOSIS — M7661 Achilles tendinitis, right leg: Secondary | ICD-10-CM | POA: Diagnosis not present

## 2022-11-09 DIAGNOSIS — D2372 Other benign neoplasm of skin of left lower limb, including hip: Secondary | ICD-10-CM

## 2022-11-09 MED ORDER — MELOXICAM 15 MG PO TABS: 15.0000 mg | ORAL_TABLET | Freq: Every day | ORAL | 1 refills | Status: AC

## 2022-11-09 NOTE — Progress Notes (Signed)
Chief Complaint  Patient presents with   Foot Pain    Follow up achilles tendonitis bilateral   "I got these spurs that keep me from walking"   Callouses    Trim calluses - plantar forefoot and 5th toe left     HPI: 77 y.o. female presenting today for follow-up evaluation of recalcitrant posterior heel Spaete pain bilateral as well as symptomatic calluses to bilateral feet.  Patient states that her posterior heels are tender and painful on a daily basis.  Injections in the past that helped significantly.  Currently she is not taking any anti-inflammatory or doing anything for treatment  She also has symptomatic calluses to the left foot.  She has had them debrided in the past which has helped significantly.  Past Medical History:  Diagnosis Date   Allergy    ANEMIA, IRON DEFICIENCY 05/08/2009   Angina    ASYMPTOMATIC POSTMENOPAUSAL STATUS 10/11/2008   Blood transfusion    Blood transfusion without reported diagnosis    Breast cancer (HCC) 09/29/2011   invasive grade III ductal ca,assoc high grade dcis,ER/PR=neg; left breast   C. difficile colitis    Cataract    Diverticulosis of colon (without mention of hemorrhage)    DVT (deep venous thrombosis) (HCC)    08/21/22 "YEARS AGO"   Esophageal reflux 06/12/2008   Gastroparesis    GOITER, MULTINODULAR 04/02/2009   Gout, unspecified    H/O hiatal hernia    History of kidney stones    History of lower GI bleeding    History of radiation therapy 02/08/12-03/25/12   left breast,total 61gy   Hypokalemia 05/11/2013   Hypomagnesemia    HYPOTHYROIDISM, POST-RADIATION 08/13/2009   Internal hemorrhoids without mention of complication    Kidney stones    "several"   Leukopenia    Migraines    Neuropathy    Obesity    On supplemental oxygen therapy    at night while sleeping - patient did not want to undergo sleep study   Osteoarthrosis, unspecified whether generalized or localized, unspecified site    Other and unspecified  hyperlipidemia    Personal history of chemotherapy 2013   Personal history of radiation therapy 2013   left   PONV (postoperative nausea and vomiting)    PUD (peptic ulcer disease)    Short bowel syndrome    Shortness of breath on exertion    "sometimes"   Stricture and stenosis of esophagus    Thyrotoxicosis without mention of goiter or other cause, without mention of thyrotoxic crisis or storm    Type II or unspecified type diabetes mellitus without mention of complication, not stated as uncontrolled    no med in years diet controled   Unspecified essential hypertension    UTI (urinary tract infection)    Varicose veins    VITAMIN B12 DEFICIENCY 08/30/2009    Past Surgical History:  Procedure Laterality Date   ABDOMINAL ADHESION SURGERY  1980's thru 1990's   "several"   ABDOMINAL HYSTERECTOMY  1970's   with BSO   BREAST BIOPSY Left 08/13/11   left breast lower inner quadrant   BREAST BIOPSY Right 1985   Rt exc bx, benign   BREAST LUMPECTOMY Left 08/2011   BREAST LUMPECTOMY W/ NEEDLE LOCALIZATION  09/29/11   left  breast=lymph node,excision benign/ ER/PR=neg, her 2 Positive   BREAST REDUCTION SURGERY Bilateral 06/09/2021   Procedure: MAMMARY REDUCTION  (BREAST);  Surgeon: Allena Napoleon, MD;  Location: [facility] OR;  Service: Plastics;  Laterality:  Bilateral;  2 hours   BUNIONECTOMY  1970's   bilateral   CHOLECYSTECTOMY  1990's   COLON SURGERY     "several surgeries for short bowel syndrome"   COLONOSCOPY  2012   multiple    DILATION AND CURETTAGE OF UTERUS     ESOPHAGOGASTRODUODENOSCOPY  2011   multiple    EXCISIONAL HEMORRHOIDECTOMY  11/10/2016   EYE SURGERY     "long time ago"   FLEXIBLE SIGMOIDOSCOPY  2011   multiple    KIDNEY STONE SURGERY  1990's   "tried to go up & get it but pushed it further up"   LITHOTRIPSY     "4 or 5 times"   MASS EXCISION Left 08/28/2022   Procedure: EXCISION TISSUE OF LEFT BREAST;  Surgeon: Santiago Glad, MD;  Location: [facility] OR;  Service:  Plastics;  Laterality: Left;   MASTECTOMY W/ NODES PARTIAL  09/29/11   left   PORT-A-CATH REMOVAL Right 12/19/2013   Procedure: MINOR REMOVAL PORT-A-CATH;  Surgeon: Ernestene Mention, MD;  Location: [facility];  Service: General;  Laterality: Right;   PORTACATH PLACEMENT  09/29/2011   Procedure: INSERTION PORT-A-CATH;  Surgeon: Ernestene Mention, MD;  Location: [facility] OR;  Service: General;  Laterality: N/A;   SMALL INTESTINE SURGERY     Thyroid Ultrasound  12/1994 and 12/1995   TOTAL KNEE ARTHROPLASTY Right 06/05/2015   Procedure: RIGHT TOTAL KNEE ARTHROPLASTY;  Surgeon: Loreta Ave, MD;  Location: [facility] OR;  Service: Orthopedics;  Laterality: Right;   UPPER GASTROINTESTINAL ENDOSCOPY     VEIN LIGATION AND STRIPPING  1980's   Right leg    Allergies  Allergen Reactions   Aspirin Other (See Comments)    REACTION: Gi Intolerance/ Burning in stomach   Trazodone And Nefazodone Nausea And Vomiting    "sick"   Flagyl [Metronidazole] Rash   Morphine And Codeine Itching     Physical Exam: General: The patient is alert and oriented x3 in no acute distress.  Dermatology: Hyperkeratotic benign skin lesion noted with a central nucleated core to the left foot  Vascular: Palpable pedal pulses bilaterally. No edema or erythema noted. Capillary refill within normal limits.  Neurological: Grossly intact via light touch  Musculoskeletal Exam: Today there is some residual tenderness with palpation along the posterior tubercle of the calcaneus bilateral.  Muscle strength 5/5 all compartments.  Radiographic Exam RT foot 02/03/2022:  Small posterior calcaneal spur noted to the respective calcaneus on lateral view. No fracture or dislocation noted. Normal osseous mineralization noted.   Orthopedic screw noted within the first metatarsal  Assessment: 1. Insertional Achilles tendinitis bilateral -Refill prescription for meloxicam 15 mg daily as needed -Recommend daily stretching exercises -Good  supportive tennis shoes and sneakers.  Advised insulin barefoot -Patient declined oral prednisone or injections today  2.  Symptomatic benign skin lesion left foot -Excisional debridement of the hyperkeratotic skin lesion was performed today using a 312 scalpel.  Salicylic acid and a light dressing applied -Recommend shoes that do not irritate or constrict the toebox area -Return to clinic as needed  Felecia Shelling, DPM [facility]  Dr. Felecia Shelling, DPM    [address], [place] [address]  Office [phone]  Fax [phone]

## 2022-11-11 ENCOUNTER — Ambulatory Visit (INDEPENDENT_AMBULATORY_CARE_PROVIDER_SITE_OTHER): Payer: Medicare Other | Admitting: Physician Assistant

## 2022-11-11 ENCOUNTER — Other Ambulatory Visit: Payer: Self-pay | Admitting: Family Medicine

## 2022-11-11 VITALS — BP 128/78 | HR 80

## 2022-11-11 DIAGNOSIS — N61 Mastitis without abscess: Secondary | ICD-10-CM

## 2022-11-11 MED ORDER — SULFAMETHOXAZOLE-TRIMETHOPRIM 800-160 MG PO TABS: 1.0000 | ORAL_TABLET | Freq: Two times a day (BID) | ORAL | 0 refills | Status: DC

## 2022-11-11 NOTE — Telephone Encounter (Signed)
Pt calling for refill of Lyrica to [facility] mail order.

## 2022-11-11 NOTE — Progress Notes (Signed)
This is a 77 year old female who underwent excision of left breast mass by Dr. Ladona Ridgel on 08/28/2022.  She was most recently seen in the office on 10/27/2022.  At that time she had done very well without significant issues or concerns.  Patient notes that over the last week she has developed some redness swelling and discomfort along the left breast and nipple.  She notes a small amount of serous drainage from the areola.  She denies any fever or any systemic symptoms.  She notes she is otherwise in good health.  Chaperone present.  On exam left breast with some redness minimal induration along the lower NAC and breast.  Small amount of serous fluid expressed with pressure along the 3 o'clock position of the areola.   This is a 77 year old female seen in our office for follow-up evaluation post left breast excision.  The excision was completed at the end of March, the patient has some swelling redness and induration, question underlying infection, seroma, fat necrosis.  Dr. Ladona Ridgel had the opportunity to evaluate the patient today as well.  We will place her on antibiotics, I have placed an order for a left breast ultrasound.  The patient will follow-up in our office early next week for reevaluation or sooner as needed.  She verbalized understanding and agreement to today's plan had no further questions or concerns.

## 2022-11-12 NOTE — Telephone Encounter (Signed)
Last OV 07/28/22 Next OV not scheduled   Last refill 08/18/22 ? Historical provider Qty unknown

## 2022-11-13 MED ORDER — PREGABALIN 75 MG PO CAPS: 75.0000 mg | ORAL_CAPSULE | Freq: Two times a day (BID) | ORAL | 1 refills | Status: DC

## 2022-11-13 NOTE — Telephone Encounter (Signed)
Sent in electronically . today 

## 2022-11-16 ENCOUNTER — Other Ambulatory Visit: Payer: Self-pay | Admitting: Physician Assistant

## 2022-11-16 DIAGNOSIS — N61 Mastitis without abscess: Secondary | ICD-10-CM

## 2022-11-17 ENCOUNTER — Ambulatory Visit
Admission: RE | Admit: 2022-11-17 | Discharge: 2022-11-17 | Disposition: A | Discharge status: Home / Self Care | Payer: Medicare Other | Source: Ambulatory Visit | Attending: Physician Assistant | Admitting: Physician Assistant

## 2022-11-17 ENCOUNTER — Other Ambulatory Visit: Payer: Self-pay | Admitting: Physician Assistant

## 2022-11-17 ENCOUNTER — Ambulatory Visit: Payer: Medicare Other

## 2022-11-17 DIAGNOSIS — N61 Mastitis without abscess: Secondary | ICD-10-CM

## 2022-11-17 DIAGNOSIS — N6489 Other specified disorders of breast: Secondary | ICD-10-CM | POA: Diagnosis not present

## 2022-11-18 ENCOUNTER — Ambulatory Visit (INDEPENDENT_AMBULATORY_CARE_PROVIDER_SITE_OTHER): Payer: Medicare Other | Admitting: Physician Assistant

## 2022-11-18 DIAGNOSIS — N6489 Other specified disorders of breast: Secondary | ICD-10-CM

## 2022-11-18 DIAGNOSIS — Z9889 Other specified postprocedural states: Secondary | ICD-10-CM

## 2022-11-18 NOTE — Progress Notes (Signed)
HeaHPI F former smoker ( 20 pk yr, quit 1980) referred for shortness of breath by Dr Acharya/ Cardiology for eval of Dyspnea and wheezing, complicated by HTN, Hiatal Hernia, GERD/ stricture, Gatroparesis, Hx C.diff colitis, Goiter/ Hypothyroid p RAI, DM2, Eczema, Breast Cancer L/ chemo/ XRT, FE def Anemia, Renal Insuficiency,  ECHO- nl w mild AR,  Negative ischemia w/u w non-obstructive CAD on CCTA,  PFT 03/22/20- minimal obstruction possible, FEV1/FVC 0.80, insignificant response to bronchodilator, mild Diffusion deficit Overnight Oximetry 03/07/20- O2 sat was 5% or more below avg for at least 5 cumulative minutes. (7 min 44 sec), Qualifying for sleep O2 Group 1 alternate. ==========================================================   01/06/21- 74 yoF former smoker ( 20 pk yr, quit 1980) referred for shortness of breath by Dr Acharya/ Cardiology for eval of Dyspnea and wheezing, complicated by HTN, Hiatal Hernia, GERD/ stricture, Gastroparesis, Hx C.diff colitis, Goiter/ Hypothyroid p RAI, DM2, Eczema, Breast Cancer L/ chemo/ XRT, FE def Anemia, Renal Insuficiency, Aortic Atherosclerosis,  O2 2L sleep and portable/ [employer] ECHO- nl w mild AR,  PFT 03/22/20- minimal obstruction possible, FEV1/FVC 0.80, insignificant response to bronchodilator, mild Diffusion deficit Anemia chronic- Hgb 10.2.Nl indices - ProAirHFA, Flonase,      Semaglutide Recent Hgb still 10.1,  CTa chest/ aorta- 08/28/20- no acute findings Covid vax- 4 Phizer Hears chest rattle, worse at night lying down, not relieved by inhaler. Doesn't know names of meds- dropped off Trelegy. I think she will do better with nebulizer machine.   11/20/22- 24 yoF former smoker ( 20 pk yr, quit 1980) referred for shortness of breath by Dr Acharya/ Cardiology for eval of Dyspnea and wheezing, complicated by HTN, Hiatal Hernia, GERD/ stricture, Gastroparesis, Hx C.diff colitis, Goiter/ Hypothyroid p RAI, DM2, Eczema, Breast Cancer L/ chemo/ XRT, FE def  Anemia, Renal Insuficiency, Aortic Atherosclerosis/ CAD,  O2 2L - no longer has. - ProAirHFA, Anoro,  Flonase,      Semaglutide Previous w/u for dyspnea noted mild obst, mild Aortic Regurg, mild chronic anemia Arrived on room air- 100% sat, HR 82 She asks prescription refill albuterol and reports continued use of Anoro.  Inhalers do control her wheezing.  She no longer has oxygen which she turned back in.  Denies cough and denies significant respiratory concerns.  She feels stable CT chest 07/02/22- IMPRESSION: 1. No acute intrathoracic process. 2. No pulmonary nodule or mass. 3. 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 Recommend annual imaging followup by CTA or MRA. This recommendation follows 2010 ACCF/AHA/AATS/ACR/ASA/SCA/SCAI/SIR/STS/SVM Guidelines for the Diagnosis and Management of Patients with Thoracic Aortic Disease. Circulation. 2010; 121: Z610-R604. Aortic aneurysm NOS (ICD10-I71.9) 4. Coronary artery calcifications.  ROS-see HPI   + = positive Constitutional:    weight loss, night sweats, fevers, chills, +fatigue, lassitude. HEENT:    headaches, difficulty swallowing, tooth/dental problems, sore throat,       sneezing, itching, ear ache, nasal congestion, post nasal drip, snoring CV:    chest pain, orthopnea, PND, +swelling in lower extremities, anasarca,                                   dizziness, palpitations Resp:  + shortness of breath with exertion or at rest.                productive cough,   non-productive cough, coughing up of blood.  change in color of mucus.  +wheezing.   Skin:    rash or lesions. GI:  + heartburn, indigestion, abdominal pain, nausea, vomiting, diarrhea,                 change in bowel habits, loss of appetite GU: dysuria, change in color of urine, no urgency or frequency.   flank pain. MS:   joint pain, stiffness, decreased range of motion, back pain. Neuro-     nothing  unusual Psych:  change in mood or affect.  depression or anxiety.   memory loss.  OBJ- Physical Exam General- Alert, Oriented, Affect-appropriate, Distress- none acute, + overweight Skin- rash-none, lesions- none, excoriation- none Lymphadenopathy- none Head- atraumatic            Eyes- Gross vision intact, PERRLA, conjunctivae and secretions clear            Ears- Hearing, canals-normal            Nose- Clear, no-Septal dev, mucus, polyps, erosion, perforation             Throat- Mallampati III-IV , mucosa clear , drainage- none, tonsils- atrophic, + teeth Neck- flexible , trachea midline, no stridor , thyroid nl, carotid no bruit Chest - symmetrical excursion , unlabored           Heart/CV- RRR , no murmur , no gallop  , no rub, nl s1 s2                           -JVD-none , edema-none, stasis changes- none, varices- none           Lung- clear to P&A, wheeze- none, cough- none , dullness-none, rub- none           Chest wall-  Abd-  Br/ Gen/ Rectal- Not done, not indicated Neuro- grossly intact to observation. +Question mild memory deficit.

## 2022-11-18 NOTE — Progress Notes (Signed)
This is a 77 year old female seen in office for follow-up evaluation status post excision of left breast mass by Dr. Ladona Ridgel on 08/28/2022.  She was most recently seen in the office on 11/11/2022.  At her last office visit she had a fluid collection within the left breast.  She was placed on antibiotics.  She was sent to radiology for imaging.  Her imaging showed a fluid collection most consistent with seroma.  She notes that her symptoms have improved since starting antibiotics but still has fluid within the breast.  Any fever or systemic symptoms.  She denies any further drainage.  Chaperone present.  On exam left breast with some swelling, chronic skin changes, no significant warmth to touch, no drainage.  This is a 77 year old female seen in our office for follow-up evaluation.  She does have a fluid collection within the left breast which would be most consistent with a seroma.  I discussed treatment options, the patient would like to proceed with IR guided drainage.  She will be referred back to the breast center for drainage.  I like to see her back in our office after this is completed for follow-up evaluation and ongoing management.  She was given strict return precautions.  She verbalized understanding and agreement to today's plan.

## 2022-11-20 ENCOUNTER — Encounter: Payer: Self-pay | Admitting: Internal Medicine

## 2022-11-20 ENCOUNTER — Ambulatory Visit (INDEPENDENT_AMBULATORY_CARE_PROVIDER_SITE_OTHER): Payer: Medicare Other | Admitting: Internal Medicine

## 2022-11-20 VITALS — BP 112/62 | HR 82 | Ht 65.0 in | Wt 211.0 lb

## 2022-11-20 DIAGNOSIS — J4530 Mild persistent asthma, uncomplicated: Secondary | ICD-10-CM | POA: Diagnosis not present

## 2022-11-20 DIAGNOSIS — G4734 Idiopathic sleep related nonobstructive alveolar hypoventilation: Secondary | ICD-10-CM

## 2022-11-20 MED ORDER — ALBUTEROL SULFATE HFA 108 (90 BASE) MCG/ACT IN AERS: 2.0000 | INHALATION_SPRAY | Freq: Four times a day (QID) | RESPIRATORY_TRACT | 4 refills | Status: DC | PRN

## 2022-11-20 NOTE — Assessment & Plan Note (Signed)
Mild persistent uncomplicated Plan- continue Anoro, refill albuterol

## 2022-11-20 NOTE — Assessment & Plan Note (Signed)
Clinically improved. Oxygenating well today on room air.  Plan- reassess as indicated

## 2022-11-20 NOTE — Patient Instructions (Signed)
Glad you are doing well.  Refill sent for your albuterol rescue inhaler  Please call if we can help

## 2022-11-26 ENCOUNTER — Ambulatory Visit: Payer: Medicare Other

## 2022-11-30 ENCOUNTER — Ambulatory Visit
Admission: RE | Admit: 2022-11-30 | Discharge: 2022-11-30 | Disposition: A | Discharge status: Home / Self Care | Payer: Medicare Other | Source: Ambulatory Visit | Attending: Physician Assistant | Admitting: Physician Assistant

## 2022-11-30 DIAGNOSIS — N6489 Other specified disorders of breast: Secondary | ICD-10-CM | POA: Diagnosis not present

## 2022-12-01 ENCOUNTER — Encounter: Payer: Self-pay | Admitting: Physician Assistant

## 2022-12-16 ENCOUNTER — Ambulatory Visit (INDEPENDENT_AMBULATORY_CARE_PROVIDER_SITE_OTHER): Payer: Medicare Other

## 2022-12-16 ENCOUNTER — Telehealth: Payer: Self-pay | Admitting: *Deleted

## 2022-12-16 VITALS — BP 124/82 | HR 75 | Temp 97.2°F | Resp 16 | Ht 65.0 in | Wt 216.4 lb

## 2022-12-16 DIAGNOSIS — Z599 Problem related to housing and economic circumstances, unspecified: Secondary | ICD-10-CM | POA: Diagnosis not present

## 2022-12-16 DIAGNOSIS — Z0000 Encounter for general adult medical examination without abnormal findings: Secondary | ICD-10-CM

## 2022-12-16 LAB — HM DIABETES EYE EXAM

## 2022-12-16 NOTE — Patient Instructions (Addendum)
[name] , Thank you for taking time to come for your Medicare Wellness Visit. I appreciate your ongoing commitment to your health goals. Please review the following plan we discussed and let me know if I can assist you in the future.   These are the goals we discussed:  Goals      Patient Stated     My healthcare goal for 2024 is to maintain my health, stay independent and possibly join [employer].        This is a list of the screening recommended for you and due dates:  Health Maintenance  Topic Date Due   Zoster (Shingles) Vaccine (1 of 2) 04/06/1965   COVID-19 Vaccine (5 - 2023-24 season) 01/30/2022   Medicare Annual Wellness Visit  01/10/2023   Flu Shot  12/31/2022   Yearly kidney function blood test for diabetes  09/11/2023   Yearly kidney health urinalysis for diabetes  09/11/2023   DTaP/Tdap/Td vaccine (3 - Td or Tdap) 06/29/2027   Pneumonia Vaccine  Completed   DEXA scan (bone density measurement)  Completed   Hepatitis C Screening  Completed   HPV Vaccine  Aged Out   Colon Cancer Screening  Discontinued    Advanced directives: Yes; Please bring a copy of your health care power of attorney and living will to the office at your convenience.  Conditions/risks identified: Yes  Next appointment: It was nice speaking with you today!  Please follow up in one year for your annual wellness visit in the office with Nurse John Williamsen on 12/20/2023 at 10:00 a.m.  If you need to cancel or reschedule please call [phone].   Preventive Care 79 Years and Older, Female Preventive care refers to lifestyle choices and visits with your health care provider that can promote health and wellness. What does preventive care include? A yearly physical exam. This is also called an annual well check. Dental exams once or twice a year. Routine eye exams. Ask your health care provider how often you should have your eyes checked. Personal lifestyle choices, including: Daily care of your teeth  and gums. Regular physical activity. Eating a healthy diet. Avoiding tobacco and drug use. Limiting alcohol use. Practicing safe sex. Taking low-dose aspirin every day. Taking vitamin and mineral supplements as recommended by your health care provider. What happens during an annual well check? The services and screenings done by your health care provider during your annual well check will depend on your age, overall health, lifestyle risk factors, and family history of disease. Counseling  Your health care provider may ask you questions about your: Alcohol use. Tobacco use. Drug use. Emotional well-being. Home and relationship well-being. Sexual activity. Eating habits. History of falls. Memory and ability to understand (cognition). Work and work Astronomer. Reproductive health. Screening  You may have the following tests or measurements: Height, weight, and BMI. Blood pressure. Lipid and cholesterol levels. These may be checked every 5 years, or more frequently if you are over 28 years old. Skin check. Lung cancer screening. You may have this screening every year starting at age 83 if you have a 30-pack-year history of smoking and currently smoke or have quit within the past 15 years. Fecal occult blood test (FOBT) of the stool. You may have this test every year starting at age 34. Flexible sigmoidoscopy or colonoscopy. You may have a sigmoidoscopy every 5 years or a colonoscopy every 10 years starting at age 38. Hepatitis C blood test. Hepatitis B blood test. Sexually transmitted disease (  STD) testing. Diabetes screening. This is done by checking your blood sugar (glucose) after you have not eaten for a while (fasting). You may have this done every 1-3 years. Bone density scan. This is done to screen for osteoporosis. You may have this done starting at age 67. Mammogram. This may be done every 1-2 years. Talk to your health care provider about how often you should have regular  mammograms. Talk with your health care provider about your test results, treatment options, and if necessary, the need for more tests. Vaccines  Your health care provider may recommend certain vaccines, such as: Influenza vaccine. This is recommended every year. Tetanus, diphtheria, and acellular pertussis (Tdap, Td) vaccine. You may need a Td booster every 10 years. Zoster vaccine. You may need this after age 82. Pneumococcal 13-valent conjugate (PCV13) vaccine. One dose is recommended after age 42. Pneumococcal polysaccharide (PPSV23) vaccine. One dose is recommended after age 56. Talk to your health care provider about which screenings and vaccines you need and how often you need them. This information is not intended to replace advice given to you by your health care provider. Make sure you discuss any questions you have with your health care provider. Document Released: 06/14/2015 Document Revised: 02/05/2016 Document Reviewed: 03/19/2015 Elsevier Interactive Patient Education  2017 [employer].  Fall Prevention in the Home Falls can cause injuries. They can happen to people of all ages. There are many things you can do to make your home safe and to help prevent falls. What can I do on the outside of my home? Regularly fix the edges of walkways and driveways and fix any cracks. Remove anything that might make you trip as you walk through a door, such as a raised step or threshold. Trim any bushes or trees on the path to your home. Use bright outdoor lighting. Clear any walking paths of anything that might make someone trip, such as rocks or tools. Regularly check to see if handrails are loose or broken. Make sure that both sides of any steps have handrails. Any raised decks and porches should have guardrails on the edges. Have any leaves, snow, or ice cleared regularly. Use sand or salt on walking paths during winter. Clean up any spills in your garage right away. This includes oil  or grease spills. What can I do in the bathroom? Use night lights. Install grab bars by the toilet and in the tub and shower. Do not use towel bars as grab bars. Use non-skid mats or decals in the tub or shower. If you need to sit down in the shower, use a plastic, non-slip stool. Keep the floor dry. Clean up any water that spills on the floor as soon as it happens. Remove soap buildup in the tub or shower regularly. Attach bath mats securely with double-sided non-slip rug tape. Do not have throw rugs and other things on the floor that can make you trip. What can I do in the bedroom? Use night lights. Make sure that you have a light by your bed that is easy to reach. Do not use any sheets or blankets that are too big for your bed. They should not hang down onto the floor. Have a firm chair that has side arms. You can use this for support while you get dressed. Do not have throw rugs and other things on the floor that can make you trip. What can I do in the kitchen? Clean up any spills right away. Avoid walking on  wet floors. Keep items that you use a lot in easy-to-reach places. If you need to reach something above you, use a strong step stool that has a grab bar. Keep electrical cords out of the way. Do not use floor polish or wax that makes floors slippery. If you must use wax, use non-skid floor wax. Do not have throw rugs and other things on the floor that can make you trip. What can I do with my stairs? Do not leave any items on the stairs. Make sure that there are handrails on both sides of the stairs and use them. Fix handrails that are broken or loose. Make sure that handrails are as long as the stairways. Check any carpeting to make sure that it is firmly attached to the stairs. Fix any carpet that is loose or worn. Avoid having throw rugs at the top or bottom of the stairs. If you do have throw rugs, attach them to the floor with carpet tape. Make sure that you have a light  switch at the top of the stairs and the bottom of the stairs. If you do not have them, ask someone to add them for you. What else can I do to help prevent falls? Wear shoes that: Do not have high heels. Have rubber bottoms. Are comfortable and fit you well. Are closed at the toe. Do not wear sandals. If you use a stepladder: Make sure that it is fully opened. Do not climb a closed stepladder. Make sure that both sides of the stepladder are locked into place. Ask someone to hold it for you, if possible. Clearly mark and make sure that you can see: Any grab bars or handrails. First and last steps. Where the edge of each step is. Use tools that help you move around (mobility aids) if they are needed. These include: Canes. Walkers. Scooters. Crutches. Turn on the lights when you go into a dark area. Replace any light bulbs as soon as they burn out. Set up your furniture so you have a clear path. Avoid moving your furniture around. If any of your floors are uneven, fix them. If there are any pets around you, be aware of where they are. Review your medicines with your doctor. Some medicines can make you feel dizzy. This can increase your chance of falling. Ask your doctor what other things that you can do to help prevent falls. This information is not intended to replace advice given to you by your health care provider. Make sure you discuss any questions you have with your health care provider. Document Released: 03/14/2009 Document Revised: 10/24/2015 Document Reviewed: 06/22/2014 Elsevier Interactive Patient Education  2017 [employer].

## 2022-12-16 NOTE — Progress Notes (Signed)
  Care Coordination   Note   12/16/2022 Name: [name] MRN: [id number] DOB: 03/02/1946  [name] is a 77 y.o. year old female who sees Myrlene Broker, MD for primary care. I reached out to [name] by phone today to offer care coordination services.  Ms. Curet was given information about Care Coordination services today including:   The Care Coordination services include support from the care team which includes your Nurse Coordinator, Clinical Social Worker, or Pharmacist.  The Care Coordination team is here to help remove barriers to the health concerns and goals most important to you. Care Coordination services are voluntary, and the patient may decline or stop services at any time by request to their care team member.   Care Coordination Consent Status: Patient agreed to services and verbal consent obtained.   Follow up plan:  Telephone appointment with care coordination team member scheduled for:  12/22/2022  Encounter Outcome:  Pt. Scheduled from referral   Burman Nieves, [facility] Care Coordination Care Guide Direct Dial: [phone]

## 2022-12-16 NOTE — Progress Notes (Signed)
  Care Coordination  Outreach Note  12/16/2022 Name: TISHANA CLINKENBEARD MRN: [id number] DOB: August 30, 1945   Care Coordination Outreach Attempts: An unsuccessful telephone outreach was attempted today to offer the patient information about available care coordination services.  Follow Up Plan:  Additional outreach attempts will be made to offer the patient care coordination information and services.   Encounter Outcome:  No Answer  Burman Nieves, [facility] Care Coordination Care Guide Direct Dial: [phone]

## 2022-12-16 NOTE — Progress Notes (Addendum)
Subjective:   [name] is a 77 y.o. female who presents for Medicare Annual (Subsequent) preventive examination.  Visit Complete: In person  Review of Systems     Cardiac Risk Factors include: advanced age (>47men, >75 women);diabetes mellitus;dyslipidemia;family history of premature cardiovascular disease;hypertension;obesity (BMI >30kg/m2);sedentary lifestyle     Objective:    Today's Vitals   12/16/22 1037  BP: 124/82  Pulse: 75  Resp: 16  Temp: (!) 97.2 F (36.2 C)  TempSrc: Temporal  SpO2: 99%  Weight: 216 lb 6.4 oz (98.2 kg)  Height: 5\' 5"  (1.651 m)  PainSc: 0-No pain   Body mass index is 36.01 kg/m.     12/16/2022   10:18 AM 08/21/2022   11:37 AM 08/21/2022   10:58 AM 01/09/2022   10:34 AM 12/10/2021   11:47 AM 05/29/2021   11:38 AM 01/06/2021    3:38 PM  Advanced Directives  Does Patient Have a Medical Advance Directive? Yes  Yes Yes Yes Yes Yes  Type of Estate agent of [place];Living will Healthcare Power of [place];Living will Healthcare Power of [place];Living will Living will;Healthcare Power of [employer] Power of [place];Living will Healthcare Power of [place];Living will Living will  Does patient want to make changes to medical advance directive?     No - Patient declined No - Patient declined No - Patient declined  Copy of Healthcare Power of Attorney in Chart? No - copy requested No - copy requested  No - copy requested No - copy requested Yes - validated most recent copy scanned in chart (See row information)     Current Medications (verified) Outpatient Encounter Medications as of 12/16/2022  Medication Sig   albuterol (VENTOLIN HFA) 108 (90 Base) MCG/ACT inhaler Inhale 2 puffs into the lungs every 6 (six) hours as needed for wheezing or shortness of breath.   allopurinol (ZYLOPRIM) 100 MG tablet Take 2 tablets (200 mg total) by mouth daily.   amitriptyline (ELAVIL) 50 MG tablet TAKE ONE TABLET BY MOUTH  EVERY DAY AT BEDTIME (USE CAUTION - MAY CAUSE DROWSINESS)   Calcium Carbonate-Vitamin D (CALCIUM-VITAMIN D) 600-200 MG-UNIT CAPS Take 1 capsule by mouth daily.   cholecalciferol (VITAMIN D) 25 MCG (1000 UNIT) tablet Take 1,000 Units by mouth daily.   colchicine 0.6 MG tablet Take 1 tablet (0.6 mg total) by mouth daily as needed (gout or psuedogout pain).   Cyanocobalamin (VITAMIN B 12 PO) Take 1,000 mcg by mouth daily.   dicyclomine (BENTYL) 20 MG tablet Take 1 tablet (20 mg total) by mouth 3 (three) times daily before meals.   ferrous sulfate 325 (65 FE) MG tablet Take 1 tablet (325 mg total) by mouth daily with breakfast. (Patient taking differently: Take 325 mg by mouth daily as needed (Anemia).)   fluticasone (FLONASE) 50 MCG/ACT nasal spray Place 2 sprays into both nostrils daily. (Patient taking differently: Place 2 sprays into both nostrils daily as needed for allergies.)   furosemide (LASIX) 40 MG tablet Take 1 tablet (40 mg total) by mouth daily.   levothyroxine (SYNTHROID) 175 MCG tablet TAKE ONE TABLET BY MOUTH EVERY DAY BEFORE BREAKFAST.   lovastatin (MEVACOR) 20 MG tablet TAKE ONE TABLET BY MOUTH EVERY DAY AT 6:00PM   magnesium oxide (MAG-OX) 400 MG tablet Take 1 tablet (400 mg total) by mouth 2 (two) times daily.   meloxicam (MOBIC) 15 MG tablet Take 1 tablet (15 mg total) by mouth daily.   Multiple Vitamins-Minerals (MULTIVITAMIN WOMENS 50+ ADV PO) Take 1 tablet  by mouth daily.   nitroGLYCERIN (NITROSTAT) 0.4 MG SL tablet Place 1 tablet (0.4 mg total) under the tongue every 5 (five) minutes as needed.   ondansetron (ZOFRAN) 4 MG tablet Take 1 tablet (4 mg total) by mouth every 8 (eight) hours as needed for up to 20 doses for nausea or vomiting.   pantoprazole (PROTONIX) 40 MG tablet TAKE 1 TABLET BY MOUTH 30 MINUTES PRIOR TO BREAKFAST AND SUPPER   potassium chloride SA (KLOR-CON M) 20 MEQ tablet Take 1 tablet (20 mEq total) by mouth daily.   pregabalin (LYRICA) 75 MG capsule Take 1  capsule (75 mg total) by mouth 2 (two) times daily.   sulfamethoxazole-trimethoprim (BACTRIM DS) 800-160 MG tablet Take 1 tablet by mouth 2 (two) times daily.   traMADol (ULTRAM) 50 MG tablet Take 1 tablet (50 mg total) by mouth every 8 (eight) hours as needed for up to 15 doses for moderate pain or severe pain.   umeclidinium-vilanterol (ANORO ELLIPTA) 62.5-25 MCG/ACT AEPB Inhale 1 puff into the lungs daily.   No facility-administered encounter medications on file as of 12/16/2022.    Allergies (verified) Aspirin, Trazodone and nefazodone, Flagyl [metronidazole], and Morphine and codeine   History: Past Medical History:  Diagnosis Date   Allergy    ANEMIA, IRON DEFICIENCY 05/08/2009   Angina    ASYMPTOMATIC POSTMENOPAUSAL STATUS 10/11/2008   Blood transfusion    Blood transfusion without reported diagnosis    Breast cancer (HCC) 09/29/2011   invasive grade III ductal ca,assoc high grade dcis,ER/PR=neg; left breast   C. difficile colitis    Cataract    Diverticulosis of colon (without mention of hemorrhage)    DVT (deep venous thrombosis) (HCC)    08/21/22 "YEARS AGO"   Esophageal reflux 06/12/2008   Gastroparesis    GOITER, MULTINODULAR 04/02/2009   Gout, unspecified    H/O hiatal hernia    History of kidney stones    History of lower GI bleeding    History of radiation therapy 02/08/12-03/25/12   left breast,total 61gy   Hypokalemia 05/11/2013   Hypomagnesemia    HYPOTHYROIDISM, POST-RADIATION 08/13/2009   Internal hemorrhoids without mention of complication    Kidney stones    "several"   Leukopenia    Migraines    Neuropathy    Obesity    On supplemental oxygen therapy    at night while sleeping - patient did not want to undergo sleep study   Osteoarthrosis, unspecified whether generalized or localized, unspecified site    Other and unspecified hyperlipidemia    Personal history of chemotherapy 2013   Personal history of radiation therapy 2013   left   PONV  (postoperative nausea and vomiting)    PUD (peptic ulcer disease)    Short bowel syndrome    Shortness of breath on exertion    "sometimes"   Stricture and stenosis of esophagus    Thyrotoxicosis without mention of goiter or other cause, without mention of thyrotoxic crisis or storm    Type II or unspecified type diabetes mellitus without mention of complication, not stated as uncontrolled    no med in years diet controled   Unspecified essential hypertension    UTI (urinary tract infection)    Varicose veins    VITAMIN B12 DEFICIENCY 08/30/2009   Past Surgical History:  Procedure Laterality Date   ABDOMINAL ADHESION SURGERY  1980's thru 1990's   "several"   ABDOMINAL HYSTERECTOMY  1970's   with BSO   BREAST BIOPSY Left 08/13/11  left breast lower inner quadrant   BREAST BIOPSY Right 1985   Rt exc bx, benign   BREAST LUMPECTOMY Left 08/2011   BREAST LUMPECTOMY W/ NEEDLE LOCALIZATION  09/29/11   left  breast=lymph node,excision benign/ ER/PR=neg, her 2 Positive   BREAST REDUCTION SURGERY Bilateral 06/09/2021   Procedure: MAMMARY REDUCTION  (BREAST);  Surgeon: Allena Napoleon, MD;  Location: [facility] OR;  Service: Plastics;  Laterality: Bilateral;  2 hours   BUNIONECTOMY  1970's   bilateral   CHOLECYSTECTOMY  1990's   COLON SURGERY     "several surgeries for short bowel syndrome"   COLONOSCOPY  2012   multiple    DILATION AND CURETTAGE OF UTERUS     ESOPHAGOGASTRODUODENOSCOPY  2011   multiple    EXCISIONAL HEMORRHOIDECTOMY  11/10/2016   EYE SURGERY     "long time ago"   FLEXIBLE SIGMOIDOSCOPY  2011   multiple    KIDNEY STONE SURGERY  1990's   "tried to go up & get it but pushed it further up"   LITHOTRIPSY     "4 or 5 times"   MASS EXCISION Left 08/28/2022   Procedure: EXCISION TISSUE OF LEFT BREAST;  Surgeon: Santiago Glad, MD;  Location[facility];  Service: Plastics;  Laterality: Left;   MASTECTOMY W/ NODES PARTIAL  09/29/11   left   PORT-A-CATH REMOVAL Right 12/19/2013    Procedure: MINOR REMOVAL PORT-A-CATH;  Surgeon: Ernestene Mention, MD;  Location: [facility];  Service: General;  Laterality: Right;   PORTACATH PLACEMENT  09/29/2011   Procedure: INSERTION PORT-A-CATH;  Surgeon: Ernestene Mention, MD;  Location: [facility] OR;  Service: General;  Laterality: N/A;   SMALL INTESTINE SURGERY     Thyroid Ultrasound  12/1994 and 12/1995   TOTAL KNEE ARTHROPLASTY Right 06/05/2015   Procedure: RIGHT TOTAL KNEE ARTHROPLASTY;  Surgeon: Loreta Ave, MD;  Location: [facility] OR;  Service: Orthopedics;  Laterality: Right;   UPPER GASTROINTESTINAL ENDOSCOPY     VEIN LIGATION AND STRIPPING  1980's   Right leg   Family History  Problem Relation Age of Onset   Esophageal cancer Son        deceased   Breast cancer Son        esophageal   Diabetes Mother    Heart disease Mother    Kidney disease Sister    Diabetes Father    Hypertension Father    Kidney disease Brother        x 3   Colon cancer Paternal Uncle    Breast cancer Maternal Aunt    Breast cancer Paternal Aunt    Breast cancer Cousin        Pt states she has 15+ cousins w/ Breast CA   Rectal cancer Neg Hx    Stomach cancer Neg Hx    Social History   Socioeconomic History   Marital status: Widowed    Spouse name: Not on file   Number of children: 2   Years of education: 10   Highest education level: 10th grade  Occupational History   Occupation: RETIRED  Tobacco Use   Smoking status: Former    Current packs/day: 0.00    Average packs/day: 1 pack/day for 10.0 years (10.0 ttl pk-yrs)    Types: Cigarettes    Start date: 09/23/1975    Quit date: 09/22/1985    Years since quitting: 37.2   Smokeless tobacco: Never  Vaping Use   Vaping status: Never Used  Substance and  Sexual Activity   Alcohol use: Yes    Comment: maybe 2 glasses of wine a month   Drug use: Never   Sexual activity: Not Currently    Birth control/protection: Surgical    Comment: 1st intercourse- 15, partners- 5  Other Topics  Concern   Not on file  Social History Narrative   Not on file   Social Determinants of Health   Financial Resource Strain: High Risk (12/16/2022)   Overall Financial Resource Strain (CARDIA)    Difficulty of Paying Living Expenses: Very hard  Food Insecurity: No Food Insecurity (12/16/2022)   Hunger Vital Sign    Worried About Running Out of Food in the Last Year: Never true    Ran Out of Food in the Last Year: Never true  Transportation Needs: No Transportation Needs (12/16/2022)   PRAPARE - Administrator, Civil Service (Medical): No    Lack of Transportation (Non-Medical): No  Physical Activity: Inactive (12/16/2022)   Exercise Vital Sign    Days of Exercise per Week: 0 days    Minutes of Exercise per Session: 0 min  Stress: No Stress Concern Present (12/16/2022)   [employer] of Occupational Health - Occupational Stress Questionnaire    Feeling of Stress : Not at all  Social Connections: Moderately Integrated (12/16/2022)   Social Connection and Isolation Panel [NHANES]    Frequency of Communication with Friends and Family: More than three times a week    Frequency of Social Gatherings with Friends and Family: More than three times a week    Attends Religious Services: More than 4 times per year    Active Member of [employer] or Organizations: Yes    Attends Banker Meetings: More than 4 times per year    Marital Status: Widowed    Tobacco Counseling Counseling given: Not Answered   Clinical Intake:  Pre-visit preparation completed: Yes  Pain : No/denies pain Pain Score: 0-No pain     BMI - recorded: 36.01 Nutritional Status: BMI > 30  Obese Nutritional Risks: None Diabetes: Yes CBG done?: No Did pt. bring in CBG monitor from home?: No  How often do you need to have someone help you when you read instructions, pamphlets, or other written materials from your doctor or pharmacy?: 1 - Never What is the last grade level you completed in  school?: 10th grade  Interpreter Needed?: No  Information entered by :: Claudina Oliphant N. Denzil Mceachron, LPN.   Activities of Daily Living    12/16/2022   10:45 AM 08/21/2022   10:55 AM  In your present state of health, do you have any difficulty performing the following activities:  Hearing? 0 0  Vision? 0 0  Difficulty concentrating or making decisions? 0 0  Walking or climbing stairs? 1 1  Comment  gets short of breath  Dressing or bathing? 0 0  Doing errands, shopping? 0   Preparing Food and eating ? N   Using the Toilet? N   In the past six months, have you accidently leaked urine? Y   Do you have problems with loss of bowel control? Y   Managing your Medications? N   Managing your Finances? N   Housekeeping or managing your Housekeeping? N     Patient Care Team: Myrlene Broker, MD as PCP - General (Internal Medicine) Parke Poisson, MD as PCP - Cardiology (Cardiology) Mardella Layman, MD as Attending Physician (Gastroenterology) Drue Second, MD (Hematology and Oncology) Claud Kelp, MD  as Attending Physician (General Surgery) Edmonia Caprio, RN as Registered Nurse Chauncy Passy, MD as Consulting Physician (Hematology and Oncology) Dagoberto Ligas, MD as Consulting Physician (Nephrology) Sallye Lat, MD as Consulting Physician (Ophthalmology) Meryl Dare, MD as Consulting Physician (Gastroenterology) [facility] as Consulting Physician (Ophthalmology)  Indicate any recent Medical Services you may have received from other than [facility] providers in the past year (date may be approximate).     Assessment:   This is a routine wellness examination for Eryca.  Hearing/Vision screen Hearing Screening - Comments:: Patient denied any hearing issues at this time.  No hearing aids. Vision Screening - Comments:: Patient wears rx eyeglasses. Last eye exam done by [facility].  Dietary issues and exercise activities discussed:      Goals Addressed             This Visit's Progress    Patient Stated       My healthcare goal for 2024 is to maintain my health, stay independent and possibly join [employer].      Depression Screen    12/16/2022   10:39 AM 08/18/2022    9:40 AM 01/12/2022   11:14 AM 01/09/2022   10:25 AM 09/25/2021    9:45 AM 08/21/2021    9:42 AM 08/14/2021    1:02 PM  PHQ 2/9 Scores  PHQ - 2 Score 3 1 4  0 0 0 0  PHQ- 9 Score 13 10 19   0      Fall Risk    12/16/2022   10:18 AM 08/18/2022    9:39 AM 08/18/2022    9:38 AM 01/12/2022   11:15 AM 01/09/2022   10:22 AM  Fall Risk   Falls in the past year? 1 0 0 1 1  Number falls in past yr: 1 0 0 1 1  Injury with Fall? 0 0 0 0 1  Risk for fall due to : History of fall(s);Impaired balance/gait   History of fall(s) Impaired balance/gait;History of fall(s);Medication side effect  Follow up Falls evaluation completed;Education provided;Falls prevention discussed Falls evaluation completed Falls evaluation completed Falls evaluation completed Falls evaluation completed    MEDICARE RISK AT HOME:  Medicare Risk at Home - 12/16/22 1046     Any stairs in or around the home? No    If so, are there any without handrails? No    Home free of loose throw rugs in walkways, pet beds, electrical cords, etc? Yes    Adequate lighting in your home to reduce risk of falls? Yes    Life alert? No    Use of a cane, walker or w/c? Yes    Grab bars in the bathroom? No    Shower chair or bench in shower? Yes    Elevated toilet seat or a handicapped toilet? Yes             TIMED UP AND GO:  Was the test performed?  Yes  Length of time to ambulate 10 feet: 9 sec Gait steady and fast without use of assistive device    Cognitive Function:        12/16/2022   10:46 AM 01/09/2022   10:30 AM  6CIT Screen  What Year? 0 points 0 points  What month? 0 points 0 points  What time? 0 points 0 points  Count back from 20 0 points 0 points  Months in reverse 0 points 0  points  Repeat phrase 0 points 0 points  Total Score 0 points 0 points    Immunizations Immunization History  Administered Date(s) Administered   Fluad Quad(high Dose 65+) 02/07/2019, 02/12/2020, 02/13/2021   Influenza Split 04/15/2011, 03/23/2012   Influenza Whole 05/15/2010   Influenza, High Dose Seasonal PF 03/31/2016, 03/15/2018   Influenza,inj,Quad PF,6+ Mos 02/23/2013, 02/01/2014, 03/29/2015, 06/28/2017   Influenza-Unspecified 02/13/2020, 03/01/2022   PFIZER(Purple Top)SARS-COV-2 Vaccination 06/23/2019, 07/14/2019, 03/03/2020   Pfizer Covid-19 Vaccine Bivalent Booster 72yrs & up 04/01/2021   Pneumococcal Conjugate-13 05/01/2016   Pneumococcal Polysaccharide-23 05/15/2010   Td 01/31/2003   Tdap 06/28/2017   Zoster, Live 06/02/2011    TDAP status: Up to date  Flu Vaccine status: Up to date  Pneumococcal vaccine status: Up to date  Covid-19 vaccine status: Completed vaccines  Qualifies for Shingles Vaccine? Yes   Zostavax completed Yes   Shingrix Completed?: No.    Education has been provided regarding the importance of this vaccine. Patient has been advised to call insurance company to determine out of pocket expense if they have not yet received this vaccine. Advised may also receive vaccine at local pharmacy or Health Dept. Verbalized acceptance and understanding.  Screening Tests Health Maintenance  Topic Date Due   Zoster Vaccines- Shingrix (1 of 2) 04[birth year]06/1965   COVID-19 Vaccine (5 - 2023-24 season) 01/30/2022   OPHTHALMOLOGY EXAM  07/19/2022   INFLUENZA VACCINE  12/31/2022   Diabetic kidney evaluation - eGFR measurement  09/11/2023   Diabetic kidney evaluation - Urine ACR  09/11/2023   Medicare Annual Wellness (AWV)  12/16/2023   DTaP/Tdap/Td (3 - Td or Tdap) 06/29/2027   Pneumonia Vaccine 18+ Years old  Completed   DEXA SCAN  Completed   Hepatitis C Screening  Completed   HPV VACCINES  Aged Out   Colonoscopy  Discontinued    Health Maintenance  Health  Maintenance Due  Topic Date Due   Zoster Vaccines- Shingrix (1 of 2) 04/06/1963   COVID-19 Vaccine (5 - 2023-24 season) 01/30/2022   OPHTHALMOLOGY EXAM  07/19/2022    Colorectal cancer screening: No longer required.   Mammogram status: Completed 11/17/2022. Repeat every year  Bone Density status: Completed 05/28/2020. Results reflect: Bone density results: NORMAL. Repeat every 5 years.  Lung Cancer Screening: (Low Dose CT Chest recommended if Age 73-80 years, 20 pack-year currently smoking OR have quit w/in 15years.) does not qualify.   Lung Cancer Screening Referral: no  Additional Screening:  Hepatitis C Screening: does qualify; Completed 08/03/2013  Vision Screening: Recommended annual ophthalmology exams for early detection of glaucoma and other disorders of the eye. Is the patient up to date with their annual eye exam?  Yes  Who is the provider or what is the name of the office in which the patient attends annual eye exams? [facility] If pt is not established with a provider, would they like to be referred to a provider to establish care? Yes .   Dental Screening: Recommended annual dental exams for proper oral hygiene  Diabetic Foot Exam: Diabetic Foot Exam: Completed 02/27/2021  Community Resource Referral / Chronic Care Management: CRR required this visit?  No   CCM required this visit?  No     Plan:     I have personally reviewed and noted the following in the patient's chart:   Medical and social history Use of alcohol, tobacco or illicit drugs  Current medications and supplements including opioid prescriptions. Patient is not currently taking opioid prescriptions. Functional ability and status Nutritional status Physical activity Advanced directives List  of other physicians Hospitalizations, surgeries, and ER visits in previous 12 months Vitals Screenings to include cognitive, depression, and falls Referrals and appointments  In addition, I have  reviewed and discussed with patient certain preventive protocols, quality metrics, and best practice recommendations. A written personalized care plan for preventive services as well as general preventive health recommendations were provided to patient.     Mickeal Needy, LPN   2/95/1884   After Visit Summary: Printed handout.  Nurse Notes: Normal cognitive status assessed by direct observation by this Nurse Health Advisor. No abnormalities found.     Medical screening examination/treatment/procedure(s) were performed by non-physician practitioner and as supervising physician I was immediately available for consultation/collaboration.  I agree with above. Jacinta Shoe, MD

## 2022-12-21 ENCOUNTER — Telehealth: Payer: Self-pay | Admitting: Gastroenterology

## 2022-12-21 MED ORDER — PANTOPRAZOLE SODIUM 40 MG PO TBEC: DELAYED_RELEASE_TABLET | ORAL | 0 refills | Status: DC

## 2022-12-21 NOTE — Telephone Encounter (Signed)
I called [name] to confirm the pharmacy. She states she sometimes takes 3 a day of the pantoprazole . I told her she needs to discuss that with Dr Russella Dar that it is written for twice a day. I sent in the pantoprazole refill for BID.

## 2022-12-21 NOTE — Telephone Encounter (Signed)
PT is calling to get a refill for protonix. She has scheduled an OV for 10/2 but will run out before then. Please advise.

## 2022-12-22 ENCOUNTER — Telehealth: Payer: Self-pay

## 2022-12-22 ENCOUNTER — Encounter: Payer: Self-pay | Admitting: Internal Medicine

## 2022-12-22 ENCOUNTER — Ambulatory Visit: Payer: Self-pay

## 2022-12-22 DIAGNOSIS — H3500 Unspecified background retinopathy: Secondary | ICD-10-CM

## 2022-12-22 NOTE — Patient Outreach (Signed)
  Care Coordination   12/22/2022 Name: [name] MRN: [id number] DOB: 30-Sep-1945   Care Coordination Outreach Attempts:  An unsuccessful telephone outreach was attempted for a scheduled appointment today.  Follow Up Plan:  Additional outreach attempts will be made to offer the patient care coordination information and services.   Encounter Outcome:  No Answer   Care Coordination Interventions:  No, not indicated    SIG Lysle Morales, BSW Social Worker [facility] Care Management  [phone]

## 2022-12-23 ENCOUNTER — Ambulatory Visit: Payer: Self-pay

## 2022-12-23 NOTE — Patient Instructions (Signed)
Visit Information  Thank you for taking time to visit with me today. Please don't hesitate to contact me if I can be of assistance to you.   Following are the goals we discussed today:   Goals Addressed             This Visit's Progress    Care Coordination Activities       Care Coordination Interventions: Adult Diapers-Patient is struggling with purchasing adult diapers.  Her insurance does not cover the cost.           If you are experiencing a Mental Health or Behavioral Health Crisis or need someone to talk to, please call 911  Patient verbalizes understanding of instructions and care plan provided today and agrees to view in MyChart. Active MyChart status and patient understanding of how to access instructions and care plan via MyChart confirmed with patient.     No further follow up required:    Lysle Morales, BSW Social Worker [facility] Care Management  [phone]

## 2022-12-23 NOTE — Telephone Encounter (Signed)
 [facility] called states based off reason patient needs to see a retina specialist. States that patient is also scheduled with them for her yearly eye exam next month.

## 2022-12-23 NOTE — Patient Outreach (Signed)
  Care Coordination   Initial Visit Note   12/23/2022 Name: [name] MRN: [id number] DOB: 1945/11/25  [name] is a 77 y.o. year old female who sees Myrlene Broker, MD for primary care. I spoke with  [name] by phone today.  What matters to the patients health and wellness today?  Patient needs adult diaper resources.    Goals Addressed             This Visit's Progress    Care Coordination Activities       Care Coordination Interventions: Adult Diapers-Patient is struggling with purchasing adult diapers.  Her insurance does not cover the cost.          SDOH assessments and interventions completed:  Yes  SDOH Interventions Today    Flowsheet Row Most Recent Value  SDOH Interventions   Food Insecurity Interventions Other (Comment)  [Patient uses local food banks]  Housing Interventions Intervention Not Indicated  Utilities Interventions Intervention Not Indicated        Care Coordination Interventions:  Yes, provided  Interventions Today    Flowsheet Row Most Recent Value  General Interventions   General Interventions Discussed/Reviewed General Interventions Discussed, Community Resources  [SW contacted [employer] and provided contact information for client to receive free diapers]        Follow up plan: No further intervention required.   Encounter Outcome:  Pt. Visit Completed

## 2022-12-23 NOTE — Telephone Encounter (Signed)
Referral placed to optho for patient. Please call and let her know the screening was abnormal and it is required that she see an eye doctor to check this.

## 2022-12-23 NOTE — Telephone Encounter (Signed)
Called pt no answer LMOM w/MD response../lmb 

## 2022-12-28 ENCOUNTER — Telehealth: Payer: Self-pay | Admitting: *Deleted

## 2022-12-28 NOTE — Progress Notes (Unsigned)
  Care Coordination Note  12/28/2022 Name: [name] MRN: [id number] DOB: September 28, 1945  [name] is a 77 y.o. year old female who is a primary care patient of Myrlene Broker, MD and is actively engaged with the care management team. I reached out to [name] by phone today to assist with re-scheduling an initial visit with the BSW  Follow up plan: Unsuccessful telephone outreach attempt made. A HIPAA compliant phone message was left for the patient providing contact information and requesting a return call.   Burman Nieves, CCMA Care Coordination Care Guide Direct Dial: [phone]

## 2022-12-29 NOTE — Progress Notes (Signed)
Pt returned call and completed appt with BSW on 12/23/2022.

## 2022-12-29 NOTE — Telephone Encounter (Signed)
Patient called to check on the status of the new referral request. She was informed that it wasn't sent yet and that Dr. Okey Dupre is not in the office for the next few days. Best callback is [phone].

## 2022-12-30 ENCOUNTER — Encounter: Payer: Self-pay | Admitting: Surgical

## 2022-12-30 ENCOUNTER — Ambulatory Visit (INDEPENDENT_AMBULATORY_CARE_PROVIDER_SITE_OTHER): Payer: Medicare Other | Admitting: Surgical

## 2022-12-30 VITALS — BP 132/77 | HR 87 | Ht 65.0 in | Wt 217.4 lb

## 2022-12-30 DIAGNOSIS — Z9889 Other specified postprocedural states: Secondary | ICD-10-CM

## 2022-12-30 DIAGNOSIS — N6489 Other specified disorders of breast: Secondary | ICD-10-CM

## 2022-12-30 NOTE — Telephone Encounter (Signed)
Called pt no answer LMOM RTC concerning the referral../lmb

## 2022-12-30 NOTE — Progress Notes (Signed)
Patient ID: [name], female    DOB: 1946/04/18, 77 y.o.   MRN: [id number]  Chief Complaint  Patient presents with   Pre-op Exam      ICD-10-CM   1. Breast asymmetry  N64.89     2. S/P bilateral breast reduction  Z98.890       History of Present Illness: [name] is a 77 y.o.  female  with a history of breast cancer with subsequent bilateral breast reduction in January 2023 due to asymmetry of left breast after lumpectomy and radiation in 2013.  She presents for preoperative evaluation for upcoming procedure, fat grafting to left breast, scheduled for 01/29/2023 with Dr. Ladona Ridgel.  The patient has not had problems with anesthesia.  Patient has a history of varicose veins, history of her sister having a blood clot in her leg.  No personal history of blood clots.  No history of greater than 3 miscarriages.  She is not a smoker.  Summary of Previous Visit: Patient with history of breast cancer in her left breast, subsequently underwent bilateral breast reduction in January 2023 due to asymmetry secondary to the lumpectomy and radiation in 2013 for left breast cancer.  Patient is unhappy with the ptosis on the right side.  PMH Significant for: Breast cancer, bilateral breast reduction, radiation the left breast, hypertension, OA, gout, iron deficiency anemia.  Patient previously underwent excision of left breast wound, received clearance from PCP related to this.  She has had no recent health changes since then.  She reports she is feeling well today, she is not having any cardiac or pulmonary symptoms.  She reports that she has an albuterol inhaler which she uses for allergies/wheezing, but reports she has not used this in quite some time.  Patient reports that she is bothered by the asymmetry of her breast, she reports that she would like some increased volume in her right breast.  She previously reported and after EMR review there were discussions about fat grafting  to the left breast.  There is some confusion related to this.  Past Medical History: Allergies: Allergies  Allergen Reactions   Aspirin Other (See Comments)    REACTION: Gi Intolerance/ Burning in stomach   Trazodone And Nefazodone Nausea And Vomiting    "sick"   Flagyl [Metronidazole] Rash   Morphine And Codeine Itching    Current Medications:  Current Outpatient Medications:    albuterol (VENTOLIN HFA) 108 (90 Base) MCG/ACT inhaler, Inhale 2 puffs into the lungs every 6 (six) hours as needed for wheezing or shortness of breath., Disp: 54 g, Rfl: 4   allopurinol (ZYLOPRIM) 100 MG tablet, Take 2 tablets (200 mg total) by mouth daily., Disp: 180 tablet, Rfl: 3   amitriptyline (ELAVIL) 50 MG tablet, TAKE ONE TABLET BY MOUTH EVERY DAY AT BEDTIME (USE CAUTION - MAY CAUSE DROWSINESS), Disp: 90 tablet, Rfl: 3   Calcium Carbonate-Vitamin D (CALCIUM-VITAMIN D) 600-200 MG-UNIT CAPS, Take 1 capsule by mouth daily., Disp: , Rfl:    cholecalciferol (VITAMIN D) 25 MCG (1000 UNIT) tablet, Take 1,000 Units by mouth daily., Disp: , Rfl:    colchicine 0.6 MG tablet, Take 1 tablet (0.6 mg total) by mouth daily as needed (gout or psuedogout pain)., Disp: 90 tablet, Rfl: 2   Cyanocobalamin (VITAMIN B 12 PO), Take 1,000 mcg by mouth daily., Disp: , Rfl:    dicyclomine (BENTYL) 20 MG tablet, Take 1 tablet (20 mg total) by mouth 3 (three) times daily before  meals., Disp: 270 tablet, Rfl: 11   ferrous sulfate 325 (65 FE) MG tablet, Take 1 tablet (325 mg total) by mouth daily with breakfast. (Patient taking differently: Take 325 mg by mouth daily as needed (Anemia).), Disp: 90 tablet, Rfl: 0   fluticasone (FLONASE) 50 MCG/ACT nasal spray, Place 2 sprays into both nostrils daily. (Patient taking differently: Place 2 sprays into both nostrils daily as needed for allergies.), Disp: 48 g, Rfl: 3   furosemide (LASIX) 40 MG tablet, Take 1 tablet (40 mg total) by mouth daily., Disp: 90 tablet, Rfl: 1   levothyroxine  (SYNTHROID) 175 MCG tablet, TAKE ONE TABLET BY MOUTH EVERY DAY BEFORE BREAKFAST., Disp: 90 tablet, Rfl: 1   lovastatin (MEVACOR) 20 MG tablet, TAKE ONE TABLET BY MOUTH EVERY DAY AT 6:00PM, Disp: 90 tablet, Rfl: 3   magnesium oxide (MAG-OX) 400 MG tablet, Take 1 tablet (400 mg total) by mouth 2 (two) times daily., Disp: 180 tablet, Rfl: 1   meloxicam (MOBIC) 15 MG tablet, Take 1 tablet (15 mg total) by mouth daily., Disp: 60 tablet, Rfl: 1   Multiple Vitamins-Minerals (MULTIVITAMIN WOMENS 50+ ADV PO), Take 1 tablet by mouth daily., Disp: , Rfl:    nitroGLYCERIN (NITROSTAT) 0.4 MG SL tablet, Place 1 tablet (0.4 mg total) under the tongue every 5 (five) minutes as needed., Disp: 25 tablet, Rfl: 6   ondansetron (ZOFRAN) 4 MG tablet, Take 1 tablet (4 mg total) by mouth every 8 (eight) hours as needed for up to 20 doses for nausea or vomiting., Disp: 20 tablet, Rfl: 0   pantoprazole (PROTONIX) 40 MG tablet, TAKE 1 TABLET BY MOUTH 30 MINUTES PRIOR TO BREAKFAST AND SUPPER, Disp: 180 tablet, Rfl: 0   potassium chloride SA (KLOR-CON M) 20 MEQ tablet, Take 1 tablet (20 mEq total) by mouth daily., Disp: 90 tablet, Rfl: 1   pregabalin (LYRICA) 75 MG capsule, Take 1 capsule (75 mg total) by mouth 2 (two) times daily., Disp: 180 capsule, Rfl: 1   sulfamethoxazole-trimethoprim (BACTRIM DS) 800-160 MG tablet, Take 1 tablet by mouth 2 (two) times daily., Disp: 14 tablet, Rfl: 0   traMADol (ULTRAM) 50 MG tablet, Take 1 tablet (50 mg total) by mouth every 8 (eight) hours as needed for up to 15 doses for moderate pain or severe pain., Disp: 15 tablet, Rfl: 0   umeclidinium-vilanterol (ANORO ELLIPTA) 62.5-25 MCG/ACT AEPB, Inhale 1 puff into the lungs daily., Disp: 180 each, Rfl: 4  Past Medical Problems: Past Medical History:  Diagnosis Date   Allergy    ANEMIA, IRON DEFICIENCY 05/08/2009   Angina    ASYMPTOMATIC POSTMENOPAUSAL STATUS 10/11/2008   Blood transfusion    Blood transfusion without reported diagnosis     Breast cancer (HCC) 09/29/2011   invasive grade III ductal ca,assoc high grade dcis,ER/PR=neg; left breast   C. difficile colitis    Cataract    Diverticulosis of colon (without mention of hemorrhage)    DVT (deep venous thrombosis) (HCC)    08/21/22 "YEARS AGO"   Esophageal reflux 06/12/2008   Gastroparesis    GOITER, MULTINODULAR 04/02/2009   Gout, unspecified    H/O hiatal hernia    History of kidney stones    History of lower GI bleeding    History of radiation therapy 02/08/12-03/25/12   left breast,total 61gy   Hypokalemia 05/11/2013   Hypomagnesemia    HYPOTHYROIDISM, POST-RADIATION 08/13/2009   Internal hemorrhoids without mention of complication    Kidney stones    "several"   Leukopenia  Migraines    Neuropathy    Obesity    On supplemental oxygen therapy    at night while sleeping - patient did not want to undergo sleep study   Osteoarthrosis, unspecified whether generalized or localized, unspecified site    Other and unspecified hyperlipidemia    Personal history of chemotherapy 2013   Personal history of radiation therapy 2013   left   PONV (postoperative nausea and vomiting)    PUD (peptic ulcer disease)    Short bowel syndrome    Shortness of breath on exertion    "sometimes"   Stricture and stenosis of esophagus    Thyrotoxicosis without mention of goiter or other cause, without mention of thyrotoxic crisis or storm    Type II or unspecified type diabetes mellitus without mention of complication, not stated as uncontrolled    no med in years diet controled   Unspecified essential hypertension    UTI (urinary tract infection)    Varicose veins    VITAMIN B12 DEFICIENCY 08/30/2009    Past Surgical History: Past Surgical History:  Procedure Laterality Date   ABDOMINAL ADHESION SURGERY  1980's thru 1990's   "several"   ABDOMINAL HYSTERECTOMY  1970's   with BSO   BREAST BIOPSY Left 08/13/11   left breast lower inner quadrant   BREAST BIOPSY Right 1985    Rt exc bx, benign   BREAST LUMPECTOMY Left 08/2011   BREAST LUMPECTOMY W/ NEEDLE LOCALIZATION  09/29/11   left  breast=lymph node,excision benign/ ER/PR=neg, her 2 Positive   BREAST REDUCTION SURGERY Bilateral 06/09/2021   Procedure: MAMMARY REDUCTION  (BREAST);  Surgeon: Allena Napoleon, MD;  Location: [facility] OR;  Service: Plastics;  Laterality: Bilateral;  2 hours   BUNIONECTOMY  1970's   bilateral   CHOLECYSTECTOMY  1990's   COLON SURGERY     "several surgeries for short bowel syndrome"   COLONOSCOPY  2012   multiple    DILATION AND CURETTAGE OF UTERUS     ESOPHAGOGASTRODUODENOSCOPY  2011   multiple    EXCISIONAL HEMORRHOIDECTOMY  11/10/2016   EYE SURGERY     "long time ago"   FLEXIBLE SIGMOIDOSCOPY  2011   multiple    KIDNEY STONE SURGERY  1990's   "tried to go up & get it but pushed it further up"   LITHOTRIPSY     "4 or 5 times"   MASS EXCISION Left 08/28/2022   Procedure: EXCISION TISSUE OF LEFT BREAST;  Surgeon: Santiago Glad, MD;  Location: [facility] OR;  Service: Plastics;  Laterality: Left;   MASTECTOMY W/ NODES PARTIAL  09/29/11   left   PORT-A-CATH REMOVAL Right 12/19/2013   Procedure: MINOR REMOVAL PORT-A-CATH;  Surgeon: Ernestene Mention, MD;  Location: [facility];  Service: General;  Laterality: Right;   PORTACATH PLACEMENT  09/29/2011   Procedure: INSERTION PORT-A-CATH;  Surgeon: Ernestene Mention, MD;  Location: [facility] OR;  Service: General;  Laterality: N/A;   SMALL INTESTINE SURGERY     Thyroid Ultrasound  12/1994 and 12/1995   TOTAL KNEE ARTHROPLASTY Right 06/05/2015   Procedure: RIGHT TOTAL KNEE ARTHROPLASTY;  Surgeon: Loreta Ave, MD;  Location: [facility] OR;  Service: Orthopedics;  Laterality: Right;   UPPER GASTROINTESTINAL ENDOSCOPY     VEIN LIGATION AND STRIPPING  1980's   Right leg    Social History: Social History   Socioeconomic History   Marital status: Widowed    Spouse name: Not on file   Number of children:  2   Years of education: 10    Highest education level: 10th grade  Occupational History   Occupation: RETIRED  Tobacco Use   Smoking status: Former    Current packs/day: 0.00    Average packs/day: 1 pack/day for 10.0 years (10.0 ttl pk-yrs)    Types: Cigarettes    Start date: 09/23/1975    Quit date: 09/22/1985    Years since quitting: 37.2   Smokeless tobacco: Never  Vaping Use   Vaping status: Never Used  Substance and Sexual Activity   Alcohol use: Yes    Comment: maybe 2 glasses of wine a month   Drug use: Never   Sexual activity: Not Currently    Birth control/protection: Surgical    Comment: 1st intercourse- 15, partners- 5  Other Topics Concern   Not on file  Social History Narrative   Not on file   Social Determinants of Health   Financial Resource Strain: High Risk (12/16/2022)   Overall Financial Resource Strain (CARDIA)    Difficulty of Paying Living Expenses: Very hard  Food Insecurity: Food Insecurity Present (12/23/2022)   Hunger Vital Sign    Worried About Running Out of Food in the Last Year: Never true    Ran Out of Food in the Last Year: Sometimes true  Transportation Needs: No Transportation Needs (12/23/2022)   PRAPARE - Administrator, Civil Service (Medical): No    Lack of Transportation (Non-Medical): No  Physical Activity: Inactive (12/16/2022)   Exercise Vital Sign    Days of Exercise per Week: 0 days    Minutes of Exercise per Session: 0 min  Stress: No Stress Concern Present (12/16/2022)   [employer] of Occupational Health - Occupational Stress Questionnaire    Feeling of Stress : Not at all  Social Connections: Moderately Integrated (12/16/2022)   Social Connection and Isolation Panel [NHANES]    Frequency of Communication with Friends and Family: More than three times a week    Frequency of Social Gatherings with Friends and Family: More than three times a week    Attends Religious Services: More than 4 times per year    Active Member of [employer] or  Organizations: Yes    Attends Banker Meetings: More than 4 times per year    Marital Status: Widowed  Intimate Partner Violence: Not At Risk (12/16/2022)   Humiliation, Afraid, Rape, and Kick questionnaire    Fear of Current or Ex-Partner: No    Emotionally Abused: No    Physically Abused: No    Sexually Abused: No    Family History: Family History  Problem Relation Age of Onset   Esophageal cancer Son        deceased   Breast cancer Son        esophageal   Diabetes Mother    Heart disease Mother    Kidney disease Sister    Diabetes Father    Hypertension Father    Kidney disease Brother        x 3   Colon cancer Paternal Uncle    Breast cancer Maternal Aunt    Breast cancer Paternal Aunt    Breast cancer Cousin        Pt states she has 15+ cousins w/ Breast CA   Rectal cancer Neg Hx    Stomach cancer Neg Hx     Review of Systems: Review of Systems  Constitutional: Negative.   Respiratory: Negative.    Cardiovascular: Negative.  Gastrointestinal: Negative.   Genitourinary: Negative.   Musculoskeletal: Negative.   Neurological: Negative.     Physical Exam: Vital Signs BP 132/77 (BP Location: Right Arm, Patient Position: Sitting, Cuff Size: Large)   Pulse 87   Ht 5\' 5"  (1.651 m)   Wt 217 lb 6.4 oz (98.6 kg)   SpO2 95%   BMI 36.18 kg/m   Physical Exam Constitutional:      General: Not in acute distress.    Appearance: Normal appearance. Not ill-appearing.  HENT:     Head: Normocephalic and atraumatic.  Eyes:     Pupils: Pupils are equal, round Neck:     Musculoskeletal: Normal range of motion.  Cardiovascular:     Rate and Rhythm: Normal rate    Pulses: Normal pulses.  Pulmonary:     Effort: Pulmonary effort is normal. No respiratory distress.  Abdominal:     General: Abdomen is flat. There is no distension.  Musculoskeletal: Normal range of motion.  Skin:    General: Skin is warm and dry.     Findings: No erythema or rash.   Neurological:     General: No focal deficit present.     Mental Status: Alert and oriented to person, place, and time. Mental status is at baseline.     Motor: No weakness.  Psychiatric:        Mood and Affect: Mood normal.        Behavior: Behavior normal.    Assessment/Plan: The patient is scheduled for fat grafting from abdomen to breast with Dr. Ladona Ridgel.  Risks, benefits, and alternatives of procedure discussed, questions answered and consent obtained.    Smoking Status: Non-smoker, quit many years ago; Counseling Given?  N/A Last Mammogram: 11/17/2022 with subsequent ultrasound of left breast.; Results:  BI-RADS 2 benign  Caprini Score: 12, high; Risk Factors include: Age, history of breast cancer, family history of thrombosis, history of varicose veins, BMI > 25, and length of planned surgery. Recommendation for mechanical and possible pharmacological prophylaxis day of surgery or postoperatively. Encourage early ambulation.   Pictures obtained:Pictures previously obtained  Post-op Rx sent to pharmacy:  No prescription sent at this time.  E-prescription error, will send once able  Patient was provided with the General Surgical Risk consent document and Pain Medication Agreement prior to their appointment.  They had adequate time to read through the risk consent documents and Pain Medication Agreement. We also discussed them in person together during this preop appointment. All of their questions were answered to their satisfaction.  Recommended calling if they have any further questions.  Risk consent form and Pain Medication Agreement to be scanned into patient's chart.  The risks that can be encountered with and after liposuction were discussed and include the following but no limited to these:  Asymmetry, fluid accumulation, firmness of the area, fat necrosis with death of fat tissue, bleeding, infection, delayed healing, anesthesia risks, skin sensation changes, injury to structures  including nerves, blood vessels, and muscles which may be temporary or permanent, allergies to tape, suture materials and glues, blood products, topical preparations or injected agents, skin and contour irregularities, skin discoloration and swelling, deep vein thrombosis, cardiac and pulmonary complications, pain, which may persist, persistent pain, recurrence of the lesion, poor healing of the incision, possible need for revisional surgery or staged procedures. Thiere can also be persistent swelling, poor wound healing, rippling or loose skin, worsening of cellulite, swelling, and thermal burn or heat injury from ultrasound with the ultrasound-assisted lipoplasty  technique. Any change in weight fluctuations can alter the outcome.  Surgical clearance previously received from patient's PCP related to surgery with our office, patient has had no health changes since receiving clearance.  She is feeling well today.  We discussed need to hold multivitamin and vitamin D prior to surgery.  She is not on any blood thinners.  She has no active cardiac or pulmonary disease at this time.    Spoke with Dr. Ladona Ridgel in regards to the patient's case, will schedule additional appointment with Dr. Ladona Ridgel prior to surgery to discuss recommendations for locations of fat grafting for improved symmetry.  At that time, we will discuss possible need for Lovenox day of surgery.  Electronically signed by: Kermit Balo , PA-C 12/30/2022 3:39 PM

## 2022-12-31 MED ORDER — ONDANSETRON HCL 4 MG PO TABS: 4.0000 mg | ORAL_TABLET | Freq: Three times a day (TID) | ORAL | 0 refills | Status: DC | PRN

## 2022-12-31 MED ORDER — TRAMADOL HCL 50 MG PO TABS: 50.0000 mg | ORAL_TABLET | Freq: Three times a day (TID) | ORAL | 0 refills | Status: AC | PRN

## 2022-12-31 NOTE — Addendum Note (Signed)
Addended byKeenan Bachelor on: 12/31/2022 09:06 AM   Modules accepted: Orders

## 2023-01-05 ENCOUNTER — Other Ambulatory Visit: Payer: Self-pay | Admitting: Internal Medicine

## 2023-01-05 ENCOUNTER — Telehealth: Payer: Self-pay | Admitting: Internal Medicine

## 2023-01-05 ENCOUNTER — Encounter: Payer: Self-pay | Admitting: Internal Medicine

## 2023-01-05 ENCOUNTER — Ambulatory Visit (INDEPENDENT_AMBULATORY_CARE_PROVIDER_SITE_OTHER): Payer: Medicare Other | Admitting: Internal Medicine

## 2023-01-05 VITALS — BP 138/80 | HR 73 | Temp 97.9°F | Ht 65.0 in | Wt 211.0 lb

## 2023-01-05 DIAGNOSIS — Z131 Encounter for screening for diabetes mellitus: Secondary | ICD-10-CM

## 2023-01-05 DIAGNOSIS — E890 Postprocedural hypothyroidism: Secondary | ICD-10-CM

## 2023-01-05 DIAGNOSIS — E876 Hypokalemia: Secondary | ICD-10-CM

## 2023-01-05 DIAGNOSIS — E782 Mixed hyperlipidemia: Secondary | ICD-10-CM

## 2023-01-05 DIAGNOSIS — I10 Essential (primary) hypertension: Secondary | ICD-10-CM

## 2023-01-05 DIAGNOSIS — D500 Iron deficiency anemia secondary to blood loss (chronic): Secondary | ICD-10-CM

## 2023-01-05 LAB — CBC
HCT: 34 % — ABNORMAL LOW (ref 36.0–46.0)
Hemoglobin: 11 g/dL — ABNORMAL LOW (ref 12.0–15.0)
MCHC: 32.3 g/dL (ref 30.0–36.0)
MCV: 91.9 fl (ref 78.0–100.0)
Platelets: 266 10*3/uL (ref 150.0–400.0)
RBC: 3.7 Mil/uL — ABNORMAL LOW (ref 3.87–5.11)
RDW: 14.5 % (ref 11.5–15.5)
WBC: 4 10*3/uL (ref 4.0–10.5)

## 2023-01-05 LAB — COMPREHENSIVE METABOLIC PANEL
ALT: 26 U/L (ref 0–35)
AST: 37 U/L (ref 0–37)
Albumin: 4.3 g/dL (ref 3.5–5.2)
Alkaline Phosphatase: 69 U/L (ref 39–117)
BUN: 19 mg/dL (ref 6–23)
CO2: 27 mEq/L (ref 19–32)
Calcium: 9.8 mg/dL (ref 8.4–10.5)
Chloride: 105 mEq/L (ref 96–112)
Creatinine, Ser: 0.95 mg/dL (ref 0.40–1.20)
GFR: 57.29 mL/min — ABNORMAL LOW (ref >60.00)
Glucose, Bld: 93 mg/dL (ref 70–99)
Potassium: 3.8 mEq/L (ref 3.5–5.1)
Sodium: 142 mEq/L (ref 135–145)
Total Bilirubin: 0.7 mg/dL (ref 0.2–1.2)
Total Protein: 7.7 g/dL (ref 6.0–8.3)

## 2023-01-05 LAB — LIPID PANEL
Cholesterol: 163 mg/dL (ref 0–200)
HDL: 74.3 mg/dL (ref >39.00)
LDL Cholesterol: 58 mg/dL (ref 0–99)
NonHDL: 88.5
Total CHOL/HDL Ratio: 2
Triglycerides: 153 mg/dL — ABNORMAL HIGH (ref 0.0–149.0)
VLDL: 30.6 mg/dL (ref 0.0–40.0)

## 2023-01-05 LAB — TSH: TSH: 20.15 u[IU]/mL — ABNORMAL HIGH (ref 0.35–5.50)

## 2023-01-05 LAB — HEMOGLOBIN A1C: Hgb A1c MFr Bld: 5.9 % (ref 4.6–6.5)

## 2023-01-05 LAB — MAGNESIUM: Magnesium: 1.2 mg/dL — ABNORMAL LOW (ref 1.5–2.5)

## 2023-01-05 MED ORDER — POTASSIUM CHLORIDE CRYS ER 20 MEQ PO TBCR: 20.0000 meq | EXTENDED_RELEASE_TABLET | Freq: Every day | ORAL | 3 refills | Status: AC

## 2023-01-05 MED ORDER — FUROSEMIDE 40 MG PO TABS: 40.0000 mg | ORAL_TABLET | Freq: Every day | ORAL | 3 refills | Status: AC

## 2023-01-05 MED ORDER — FERROUS SULFATE 325 (65 FE) MG PO TABS: 325.0000 mg | ORAL_TABLET | Freq: Every day | ORAL | 3 refills | Status: AC

## 2023-01-05 MED ORDER — LEVOTHYROXINE SODIUM 175 MCG PO TABS: ORAL_TABLET | ORAL | 3 refills | Status: DC

## 2023-01-05 MED ORDER — FLUTICASONE PROPIONATE 50 MCG/ACT NA SUSP: 2.0000 | Freq: Every day | NASAL | 11 refills | Status: AC

## 2023-01-05 MED ORDER — MAGNESIUM OXIDE 400 MG PO TABS
400.0000 mg | ORAL_TABLET | Freq: Two times a day (BID) | ORAL | 3 refills | Status: DC
Start: 2023-01-05 — End: 2023-01-07

## 2023-01-05 NOTE — Assessment & Plan Note (Signed)
Checking CMP and adjust lasix 40 mg daily and adjust as needed.

## 2023-01-05 NOTE — Assessment & Plan Note (Signed)
Checking CMP to assess potassium.

## 2023-01-05 NOTE — Assessment & Plan Note (Signed)
Checking TSH and adjust as needed levothyroxine 75 mcg daily.

## 2023-01-05 NOTE — Progress Notes (Signed)
   Subjective:   Patient ID: [name], female    DOB: 07-22-1945, 77 y.o.   MRN: [id number]  HPI The patient is a 77 YO female coming in for follow up.   Review of Systems  Constitutional: Negative.   HENT: Negative.    Eyes: Negative.   Respiratory:  Negative for cough, chest tightness and shortness of breath.   Cardiovascular:  Negative for chest pain, palpitations and leg swelling.  Gastrointestinal:  Negative for abdominal distention, abdominal pain, constipation, diarrhea, nausea and vomiting.  Musculoskeletal: Negative.   Skin: Negative.   Neurological: Negative.   Psychiatric/Behavioral: Negative.      Objective:  Physical Exam Constitutional:      Appearance: She is well-developed.  HENT:     Head: Normocephalic and atraumatic.  Cardiovascular:     Rate and Rhythm: Normal rate and regular rhythm.  Pulmonary:     Effort: Pulmonary effort is normal. No respiratory distress.     Breath sounds: Normal breath sounds. No wheezing or rales.  Abdominal:     General: Bowel sounds are normal. There is no distension.     Palpations: Abdomen is soft.     Tenderness: There is no abdominal tenderness. There is no rebound.  Musculoskeletal:     Cervical back: Normal range of motion.  Skin:    General: Skin is warm and dry.  Neurological:     Mental Status: She is alert and oriented to person, place, and time.     Coordination: Coordination normal.     Vitals:   01/05/23 0837  BP: 138/80  Pulse: 73  Temp: 97.9 F (36.6 C)  TempSrc: Oral  SpO2: 99%  Weight: 211 lb (95.7 kg)  Height: 5\' 5"  (1.651 m)    Assessment & Plan:

## 2023-01-05 NOTE — Assessment & Plan Note (Signed)
Checking CMP and magnesium to ensure adequate.

## 2023-01-05 NOTE — Assessment & Plan Note (Signed)
Checking CBC and adjust as needed.  

## 2023-01-05 NOTE — Telephone Encounter (Signed)
Patient was seen today 01/05/2023 by Dr. Okey Dupre. She said she forgot to mention that she would like to start taking Zepbound. She would like to know if this can be done. If so, she would like it sent to [facility] - [place], [place] - [address]. Patient would like a call back at [phone].

## 2023-01-05 NOTE — Assessment & Plan Note (Signed)
Checking lipid panel and adjust lovastatin 20 mg daily as needed.

## 2023-01-05 NOTE — Patient Instructions (Signed)
[facility] [address] [place] [address] [phone]

## 2023-01-06 ENCOUNTER — Ambulatory Visit: Payer: Medicare Other | Admitting: Plastic Surgery

## 2023-01-06 ENCOUNTER — Other Ambulatory Visit (INDEPENDENT_AMBULATORY_CARE_PROVIDER_SITE_OTHER): Payer: Medicare Other

## 2023-01-06 DIAGNOSIS — E890 Postprocedural hypothyroidism: Secondary | ICD-10-CM

## 2023-01-06 LAB — T4, FREE: Free T4: 0.6 ng/dL (ref 0.60–1.60)

## 2023-01-07 ENCOUNTER — Other Ambulatory Visit: Payer: Self-pay | Admitting: Internal Medicine

## 2023-01-07 MED ORDER — MAGNESIUM ASPARTATE HCL 615 MG PO TBEC: 615.0000 mg | DELAYED_RELEASE_TABLET | Freq: Two times a day (BID) | ORAL | 3 refills | Status: DC

## 2023-01-07 MED ORDER — ZEPBOUND 5 MG/0.5ML ~~LOC~~ SOAJ: 5.0000 mg | SUBCUTANEOUS | 0 refills | Status: DC

## 2023-01-07 MED ORDER — LEVOTHYROXINE SODIUM 200 MCG PO TABS
200.0000 ug | ORAL_TABLET | Freq: Every day | ORAL | 3 refills | Status: DC
Start: 2023-01-07 — End: 2023-04-13

## 2023-01-07 MED ORDER — ZEPBOUND 7.5 MG/0.5ML ~~LOC~~ SOAJ: 7.5000 mg | SUBCUTANEOUS | 0 refills | Status: DC

## 2023-01-07 MED ORDER — ZEPBOUND 2.5 MG/0.5ML ~~LOC~~ SOAJ
2.5000 mg | SUBCUTANEOUS | 0 refills | Status: DC
Start: 2023-01-07 — End: 2023-01-18

## 2023-01-07 NOTE — Telephone Encounter (Signed)
I have sent in the first 3 months and she should follow up around that time.

## 2023-01-07 NOTE — Telephone Encounter (Signed)
Relayed message back to patient and also have put in for the results notes

## 2023-01-08 ENCOUNTER — Ambulatory Visit: Payer: Medicare Other | Admitting: General Practice

## 2023-01-14 ENCOUNTER — Ambulatory Visit (INDEPENDENT_AMBULATORY_CARE_PROVIDER_SITE_OTHER): Payer: Medicare Other | Admitting: Plastic Surgery

## 2023-01-14 ENCOUNTER — Encounter: Payer: Self-pay | Admitting: Plastic Surgery

## 2023-01-14 VITALS — BP 149/91 | HR 76 | Ht 65.5 in | Wt 205.0 lb

## 2023-01-14 DIAGNOSIS — N6489 Other specified disorders of breast: Secondary | ICD-10-CM

## 2023-01-14 NOTE — Progress Notes (Signed)
I asked Ms. Schmied to return for a follow-up visit so we could discuss her Caprini score and the need for anticoagulation due to her DVT risk.  On further discussion with her today her primary complaints are not breast asymmetry is much as they are difficulty finding a bra that fits on the left breast where the nipple and scar tether due to her radiation.  Fat grafting to the right breast would not improve the situation.  Further she is not interested in proceeding with surgery if the risks are particularly high which I think they are in her case.  We have decided to cancel surgery and I have referred her back to [facility] for bra fitting and potential prosthesis.  She will follow-up with me later in September after she returns from her family reunion trip to [place].

## 2023-01-18 ENCOUNTER — Other Ambulatory Visit: Payer: Self-pay

## 2023-01-18 ENCOUNTER — Telehealth: Payer: Self-pay | Admitting: Internal Medicine

## 2023-01-18 DIAGNOSIS — H10403 Unspecified chronic conjunctivitis, bilateral: Secondary | ICD-10-CM | POA: Diagnosis not present

## 2023-01-18 DIAGNOSIS — H40013 Open angle with borderline findings, low risk, bilateral: Secondary | ICD-10-CM | POA: Diagnosis not present

## 2023-01-18 DIAGNOSIS — H0102A Squamous blepharitis right eye, upper and lower eyelids: Secondary | ICD-10-CM | POA: Diagnosis not present

## 2023-01-18 DIAGNOSIS — Z961 Presence of intraocular lens: Secondary | ICD-10-CM | POA: Diagnosis not present

## 2023-01-18 DIAGNOSIS — H0102B Squamous blepharitis left eye, upper and lower eyelids: Secondary | ICD-10-CM | POA: Diagnosis not present

## 2023-01-18 MED ORDER — ZEPBOUND 2.5 MG/0.5ML ~~LOC~~ SOAJ: 2.5000 mg | SUBCUTANEOUS | 0 refills | Status: DC

## 2023-01-18 NOTE — Telephone Encounter (Signed)
Pt is here with documentation stating that she cannot receive ZEPBOUND (TIRZEPATIDE) by mail like the rest of her meds and pt needs her provider to send over this particular medication to her local pharmacy [facility] on [address].

## 2023-01-18 NOTE — Telephone Encounter (Signed)
Ok to resend to local

## 2023-01-19 DIAGNOSIS — N1831 Chronic kidney disease, stage 3a: Secondary | ICD-10-CM | POA: Diagnosis not present

## 2023-01-20 ENCOUNTER — Telehealth: Payer: Self-pay | Admitting: Internal Medicine

## 2023-01-20 NOTE — Telephone Encounter (Signed)
Patient called and said they went to pick up tirzepatide (ZEPBOUND) 2.5 MG/0.5ML Pen and it was over $1000. She would like to know if Dr. Okey Dupre would be able to prescribe something else. Patient would like a call back at [phone].

## 2023-01-21 NOTE — Telephone Encounter (Signed)
It would likely be worth discussing other options at a visit so I can explain options

## 2023-01-22 ENCOUNTER — Telehealth: Payer: Self-pay | Admitting: *Deleted

## 2023-01-22 NOTE — Progress Notes (Unsigned)
Cardiology Clinic Note   Patient Name: [name] Date of Encounter: 01/25/2023  Primary Care Provider:  Myrlene Broker, MD Primary Cardiologist:  Parke Poisson, MD  Patient Profile    [name] 77 year old female presents to the clinic today for follow-up evaluation of her essential hypertension and hyperlipidemia.  Past Medical History    Past Medical History:  Diagnosis Date   Allergy    ANEMIA, IRON DEFICIENCY 05/08/2009   Angina    ASYMPTOMATIC POSTMENOPAUSAL STATUS 10/11/2008   Blood transfusion    Blood transfusion without reported diagnosis    Breast cancer (HCC) 09/29/2011   invasive grade III ductal ca,assoc high grade dcis,ER/PR=neg; left breast   C. difficile colitis    Cataract    Diverticulosis of colon (without mention of hemorrhage)    DVT (deep venous thrombosis) (HCC)    08/21/22 "YEARS AGO"   Esophageal reflux 06/12/2008   Gastroparesis    GOITER, MULTINODULAR 04/02/2009   Gout, unspecified    H/O hiatal hernia    History of kidney stones    History of lower GI bleeding    History of radiation therapy 02/08/12-03/25/12   left breast,total 61gy   Hypokalemia 05/11/2013   Hypomagnesemia    HYPOTHYROIDISM, POST-RADIATION 08/13/2009   Internal hemorrhoids without mention of complication    Kidney stones    "several"   Leukopenia    Migraines    Neuropathy    Obesity    On supplemental oxygen therapy    at night while sleeping - patient did not want to undergo sleep study   Osteoarthrosis, unspecified whether generalized or localized, unspecified site    Other and unspecified hyperlipidemia    Personal history of chemotherapy 2013   Personal history of radiation therapy 2013   left   PONV (postoperative nausea and vomiting)    PUD (peptic ulcer disease)    Short bowel syndrome    Shortness of breath on exertion    "sometimes"   Stricture and stenosis of esophagus    Thyrotoxicosis without mention of goiter or  other cause, without mention of thyrotoxic crisis or storm    Type II or unspecified type diabetes mellitus without mention of complication, not stated as uncontrolled    no med in years diet controled   Unspecified essential hypertension    UTI (urinary tract infection)    Varicose veins    VITAMIN B12 DEFICIENCY 08/30/2009   Past Surgical History:  Procedure Laterality Date   ABDOMINAL ADHESION SURGERY  1980's thru 1990's   "several"   ABDOMINAL HYSTERECTOMY  1970's   with BSO   BREAST BIOPSY Left 08/13/11   left breast lower inner quadrant   BREAST BIOPSY Right 1985   Rt exc bx, benign   BREAST LUMPECTOMY Left 08/2011   BREAST LUMPECTOMY W/ NEEDLE LOCALIZATION  09/29/11   left  breast=lymph node,excision benign/ ER/PR=neg, her 2 Positive   BREAST REDUCTION SURGERY Bilateral 06/09/2021   Procedure: MAMMARY REDUCTION  (BREAST);  Surgeon: Allena Napoleon, MD;  Location: [facility] OR;  Service: Plastics;  Laterality: Bilateral;  2 hours   BUNIONECTOMY  1970's   bilateral   CHOLECYSTECTOMY  1990's   COLON SURGERY     "several surgeries for short bowel syndrome"   COLONOSCOPY  2012   multiple    DILATION AND CURETTAGE OF UTERUS     ESOPHAGOGASTRODUODENOSCOPY  2011   multiple    EXCISIONAL HEMORRHOIDECTOMY  11/10/2016   EYE SURGERY     "  long time ago"   FLEXIBLE SIGMOIDOSCOPY  2011   multiple    KIDNEY STONE SURGERY  1990's   "tried to go up & get it but pushed it further up"   LITHOTRIPSY     "4 or 5 times"   MASS EXCISION Left 08/28/2022   Procedure: EXCISION TISSUE OF LEFT BREAST;  Surgeon: Santiago Glad, MD;  Location: [facility] OR;  Service: Plastics;  Laterality: Left;   MASTECTOMY W/ NODES PARTIAL  09/29/11   left   PORT-A-CATH REMOVAL Right 12/19/2013   Procedure: MINOR REMOVAL PORT-A-CATH;  Surgeon: Ernestene Mention, MD;  Location: [facility];  Service: General;  Laterality: Right;   PORTACATH PLACEMENT  09/29/2011   Procedure: INSERTION PORT-A-CATH;  Surgeon:  Ernestene Mention, MD;  Location: [facility] OR;  Service: General;  Laterality: N/A;   SMALL INTESTINE SURGERY     Thyroid Ultrasound  12/1994 and 12/1995   TOTAL KNEE ARTHROPLASTY Right 06/05/2015   Procedure: RIGHT TOTAL KNEE ARTHROPLASTY;  Surgeon: Loreta Ave, MD;  Location: [facility] OR;  Service: Orthopedics;  Laterality: Right;   UPPER GASTROINTESTINAL ENDOSCOPY     VEIN LIGATION AND STRIPPING  1980's   Right leg    Allergies  Allergies  Allergen Reactions   Aspirin Other (See Comments)    REACTION: Gi Intolerance/ Burning in stomach   Trazodone And Nefazodone Nausea And Vomiting    "sick"   Flagyl [Metronidazole] Rash   Morphine And Codeine Itching    History of Present Illness    [name] has a PMH of left breast cancer (chemotherapy and radiation), GERD, hypothyroidism, mild CAD noted on coronary CTA, hyperlipidemia, hypertension, and type 2 diabetes.  Coronary CTA 2/20 showed sinus of Valsalva 48 x 48 x 49 mm.  She was previously noted to have elevated D-dimer.  She underwent CT angio chest which showed no evidence of dissection and stable measurements of sinus Valsalva.  No evidence of PE was noted.  She also had bilateral lower extremity Dopplers which were negative for DVT.  She was seen in follow-up by Dr. Jacques Navy on 09/05/2020.  During that time she was chest pain-free and reported that she felt well overall.  She continued to have right leg swelling.  Her BNP was normal.  Conservative recommendations were made.  There was no evidence of DVT or PE.  She presents to the clinic today for follow-up evaluation and states she continues to be active.  She has been going to water aerobics at the [facility] 2 times per week and she also walks.  We reviewed her February chest CT and she expressed understanding.  Her blood pressure is well-controlled today.  I will refill her nitroglycerin and plan for repeat chest CT in February.  Will plan follow-up in 1 year.  Today she denies chest  pain, shortness of breath, lower extremity edema, fatigue, palpitations, melena, hematuria, hemoptysis, diaphoresis, weakness, presyncope, syncope, orthopnea, and PND.     Home Medications    Prior to Admission medications   Medication Sig Start Date End Date Taking? Authorizing Provider  albuterol (VENTOLIN HFA) 108 (90 Base) MCG/ACT inhaler Inhale 2 puffs into the lungs every 6 (six) hours as needed for wheezing or shortness of breath. 11/20/22   Jetty Duhamel D, MD  allopurinol (ZYLOPRIM) 100 MG tablet Take 2 tablets (200 mg total) by mouth daily. 09/29/21   Rodolph Bong, MD  amitriptyline (ELAVIL) 50 MG tablet TAKE ONE TABLET BY MOUTH EVERY DAY AT  BEDTIME (USE CAUTION - MAY CAUSE DROWSINESS) 08/03/22   Myrlene Broker, MD  Calcium Carbonate-Vitamin D (CALCIUM-VITAMIN D) 600-200 MG-UNIT CAPS Take 1 capsule by mouth daily.    [provider]  cholecalciferol (VITAMIN D) 25 MCG (1000 UNIT) tablet Take 1,000 Units by mouth daily.    [provider]  colchicine 0.6 MG tablet Take 1 tablet (0.6 mg total) by mouth daily as needed (gout or psuedogout pain). 10/01/22   Rodolph Bong, MD  Cyanocobalamin (VITAMIN B 12 PO) Take 1,000 mcg by mouth daily.    [provider]  dicyclomine (BENTYL) 20 MG tablet Take 1 tablet (20 mg total) by mouth 3 (three) times daily before meals. 12/09/21   Meryl Dare, MD  ferrous sulfate 325 (65 FE) MG tablet Take 1 tablet (325 mg total) by mouth daily with breakfast. 01/05/23   Myrlene Broker, MD  fluticasone [facility]) 50 MCG/ACT nasal spray Place 2 sprays into both nostrils daily. 01/05/23   Myrlene Broker, MD  furosemide (LASIX) 40 MG tablet Take 1 tablet (40 mg total) by mouth daily. 01/05/23   Myrlene Broker, MD  levothyroxine (SYNTHROID) 200 MCG tablet Take 1 tablet (200 mcg total) by mouth daily. 01/07/23   Myrlene Broker, MD  lovastatin (MEVACOR) 20 MG tablet TAKE ONE TABLET BY MOUTH EVERY DAY AT 6:00PM 08/03/22    Myrlene Broker, MD  magnesium aspartate (MAGINEX) 615 MG tablet Take 1 tablet (615 mg total) by mouth 2 (two) times daily. 01/07/23   Myrlene Broker, MD  meloxicam (MOBIC) 15 MG tablet Take 1 tablet (15 mg total) by mouth daily. 11/09/22 03/09/23  Felecia Shelling, DPM  Multiple Vitamins-Minerals (MULTIVITAMIN WOMENS 50+ ADV PO) Take 1 tablet by mouth daily.    [provider]  nitroGLYCERIN (NITROSTAT) 0.4 MG SL tablet Place 1 tablet (0.4 mg total) under the tongue every 5 (five) minutes as needed. 08/07/20   Parke Poisson, MD  ondansetron (ZOFRAN) 4 MG tablet Take 1 tablet (4 mg total) by mouth every 8 (eight) hours as needed for up to 20 doses for nausea or vomiting. 08/20/22   Laurena Spies, PA-C  ondansetron (ZOFRAN) 4 MG tablet Take 1 tablet (4 mg total) by mouth every 8 (eight) hours as needed for nausea or vomiting. 12/31/22   Scheeler, Kermit Balo, PA-C  pantoprazole (PROTONIX) 40 MG tablet TAKE 1 TABLET BY MOUTH 30 MINUTES PRIOR TO BREAKFAST AND SUPPER 12/21/22   Meryl Dare, MD  potassium chloride SA (KLOR-CON M) 20 MEQ tablet Take 1 tablet (20 mEq total) by mouth daily. 01/05/23   Myrlene Broker, MD  pregabalin (LYRICA) 75 MG capsule Take 1 capsule (75 mg total) by mouth 2 (two) times daily. 11/13/22   Rodolph Bong, MD  sulfamethoxazole-trimethoprim (BACTRIM DS) 800-160 MG tablet Take 1 tablet by mouth 2 (two) times daily. 11/11/22   Hedges, Tinnie Gens, PA-C  tirzepatide (ZEPBOUND) 2.5 MG/0.5ML Pen Inject 2.5 mg into the skin once a week. 01/18/23   Myrlene Broker, MD  tirzepatide [facility]) 5 MG/0.5ML Pen Inject 5 mg into the skin once a week. 01/07/23   Myrlene Broker, MD  tirzepatide (ZEPBOUND) 7.5 MG/0.5ML Pen Inject 7.5 mg into the skin once a week. 01/07/23   Myrlene Broker, MD  traMADol (ULTRAM) 50 MG tablet Take 1 tablet (50 mg total) by mouth every 8 (eight) hours as needed for up to 15 doses for moderate pain or severe pain.  08/20/22    Laurena Spies, PA-C  umeclidinium-vilanterol (ANORO ELLIPTA) 62.5-25 MCG/ACT AEPB Inhale 1 puff into the lungs daily. 07/21/22   Waymon Budge, MD    Family History    Family History  Problem Relation Age of Onset   Esophageal cancer Son        deceased   Breast cancer Son        esophageal   Diabetes Mother    Heart disease Mother    Kidney disease Sister    Diabetes Father    Hypertension Father    Kidney disease Brother        x 3   Colon cancer Paternal Uncle    Breast cancer Maternal Aunt    Breast cancer Paternal Aunt    Breast cancer Cousin        Pt states she has 15+ cousins w/ Breast CA   Rectal cancer Neg Hx    Stomach cancer Neg Hx    She indicated that her mother is deceased. She indicated that her father is deceased. She indicated that four of her five sisters are alive. She indicated that all of her four brothers are deceased. She indicated that her son is deceased. She indicated that the status of her maternal aunt is unknown. She indicated that the status of her paternal aunt is unknown. She indicated that the status of her paternal uncle is unknown. She indicated that the status of her cousin is unknown. She indicated that the status of her neg hx is unknown.  Social History    Social History   Socioeconomic History   Marital status: Widowed    Spouse name: Not on file   Number of children: 2   Years of education: 10   Highest education level: 10th grade  Occupational History   Occupation: RETIRED  Tobacco Use   Smoking status: Former    Current packs/day: 0.00    Average packs/day: 1 pack/day for 10.0 years (10.0 ttl pk-yrs)    Types: Cigarettes    Start date: 09/23/1975    Quit date: 09/22/1985    Years since quitting: 37.3   Smokeless tobacco: Never  Vaping Use   Vaping status: Never Used  Substance and Sexual Activity   Alcohol use: Yes    Comment: maybe 2 glasses of wine a month   Drug use: Never   Sexual activity: Not Currently     Birth control/protection: Surgical    Comment: 1st intercourse- 15, partners- 5  Other Topics Concern   Not on file  Social History Narrative   Not on file   Social Determinants of Health   Financial Resource Strain: High Risk (12/16/2022)   Overall Financial Resource Strain (CARDIA)    Difficulty of Paying Living Expenses: Very hard  Food Insecurity: Food Insecurity Present (12/23/2022)   Hunger Vital Sign    Worried About Running Out of Food in the Last Year: Never true    Ran Out of Food in the Last Year: Sometimes true  Transportation Needs: No Transportation Needs (12/23/2022)   PRAPARE - Administrator, Civil Service (Medical): No    Lack of Transportation (Non-Medical): No  Physical Activity: Inactive (12/16/2022)   Exercise Vital Sign    Days of Exercise per Week: 0 days    Minutes of Exercise per Session: 0 min  Stress: No Stress Concern Present (12/16/2022)   [employer] of Occupational Health - Occupational Stress Questionnaire    Feeling of Stress :  Not at all  Social Connections: Moderately Integrated (12/16/2022)   Social Connection and Isolation Panel [NHANES]    Frequency of Communication with Friends and Family: More than three times a week    Frequency of Social Gatherings with Friends and Family: More than three times a week    Attends Religious Services: More than 4 times per year    Active Member of [employer] or Organizations: Yes    Attends Banker Meetings: More than 4 times per year    Marital Status: Widowed  Intimate Partner Violence: Not At Risk (12/16/2022)   Humiliation, Afraid, Rape, and Kick questionnaire    Fear of Current or Ex-Partner: No    Emotionally Abused: No    Physically Abused: No    Sexually Abused: No     Review of Systems    General:  No chills, fever, night sweats or weight changes.  Cardiovascular:  No chest pain, dyspnea on exertion, edema, orthopnea, palpitations, paroxysmal nocturnal  dyspnea. Dermatological: No rash, lesions/masses Respiratory: No cough, dyspnea Urologic: No hematuria, dysuria Abdominal:   No nausea, vomiting, diarrhea, bright red blood per rectum, melena, or hematemesis Neurologic:  No visual changes, wkns, changes in mental status. All other systems reviewed and are otherwise negative except as noted above.  Physical Exam    VS:  BP 126/64   Pulse 73   Ht 5\' 5"  (1.651 m)   Wt 215 lb (97.5 kg)   BMI 35.78 kg/m  , BMI Body mass index is 35.78 kg/m. GEN: Well nourished, well developed, in no acute distress. HEENT: normal. Neck: Supple, no JVD, carotid bruits, or masses. Cardiac: RRR, no murmurs, rubs, or gallops. No clubbing, cyanosis, generalized right lower leg edema.  Radials/DP/PT 2+ and equal bilaterally.  Respiratory:  Respirations regular and unlabored, clear to auscultation bilaterally. GI: Soft, nontender, nondistended, BS + x 4. MS: no deformity or atrophy. Skin: warm and dry, no rash. Neuro:  Strength and sensation are intact. Psych: Normal affect.  Accessory Clinical Findings    Recent Labs: 01/05/2023: ALT 26; BUN 19; Creatinine, Ser 0.95; Hemoglobin 11.0; Magnesium 1.2; Platelets 266.0; Potassium 3.8; Sodium 142; TSH 20.15   Recent Lipid Panel    Component Value Date/Time   CHOL 163 01/05/2023 0943   TRIG 153.0 (H) 01/05/2023 0943   HDL 74.30 01/05/2023 0943   CHOLHDL 2 01/05/2023 0943   VLDL 30.6 01/05/2023 0943   LDLCALC 58 01/05/2023 0943   LDLDIRECT 53.0 10/29/2015 1338         ECG personally reviewed by me today-sinus rhythm with first-degree AV block 73 bpm     Echocardiogram 08/19/2020  IMPRESSIONS     1. Left ventricular ejection fraction, by estimation, is 60 to 65%. The  left ventricle has normal function. The left ventricle has no regional  wall motion abnormalities. There is mild left ventricular hypertrophy.  Left ventricular diastolic parameters  were normal.   2. Right ventricular systolic  function is normal. The right ventricular  size is normal.   3. The mitral valve is normal in structure. No evidence of mitral valve  regurgitation. No evidence of mitral stenosis.   4. The aortic valve is normal in structure. Aortic valve regurgitation is  mild. No aortic stenosis is present.   5. See above regarding sinus dilatation Ascending aortic root also  dilated at 4.1 cm Dilatation is largest in the non coronary sinus . Aortic  dilatation noted. There is severe dilatation of the aortic root, measuring  49 mm.   6. The inferior vena cava is normal in size with greater than 50%  respiratory variability, suggesting right atrial pressure of 3 mmHg.   FINDINGS   Left Ventricle: Left ventricular ejection fraction, by estimation, is 60  to 65%. The left ventricle has normal function. The left ventricle has no  regional wall motion abnormalities. The left ventricular internal cavity  size was normal in size. There is   mild left ventricular hypertrophy. Left ventricular diastolic parameters  were normal.   Right Ventricle: The right ventricular size is normal. No increase in  right ventricular wall thickness. Right ventricular systolic function is  normal.   Left Atrium: Left atrial size was normal in size.   Right Atrium: Right atrial size was normal in size.   Pericardium: There is no evidence of pericardial effusion.   Mitral Valve: The mitral valve is normal in structure. No evidence of  mitral valve regurgitation. No evidence of mitral valve stenosis.   Tricuspid Valve: The tricuspid valve is normal in structure. Tricuspid  valve regurgitation is mild . No evidence of tricuspid stenosis.   Aortic Valve: The aortic valve is normal in structure. Aortic valve  regurgitation is mild. Aortic regurgitation PHT measures 529 msec. No  aortic stenosis is present.   Pulmonic Valve: The pulmonic valve was normal in structure. Pulmonic valve  regurgitation is not visualized. No  evidence of pulmonic stenosis.   Aorta: See above regarding sinus dilatation Ascending aortic root also  dilated at 4.1 cm Dilatation is largest in the non coronary sinus. The  aortic root is normal in size and structure and aortic dilatation noted.  There is severe dilatation of the  aortic root, measuring 49 mm.   Venous: The inferior vena cava is normal in size with greater than 50%  respiratory variability, suggesting right atrial pressure of 3 mmHg.   IAS/Shunts: No atrial level shunt detected by color flow Doppler.    Chest CT 07/02/2022  FINDINGS: Cardiovascular: The heart is normal in size and there is no pericardial effusion. Scattered coronary artery calcifications are noted. There is atherosclerotic calcification of the aorta with aneurysmal dilatation of the ascending aorta measuring 4.1 cm. The pulmonary trunk is distended suggesting underlying pulmonary artery hypertension.   Mediastinum/Nodes: No mediastinal or axillary lymphadenopathy. Evaluation of the hila is limited due to lack of IV contrast. The thyroid gland, trachea, and esophagus are within normal limits. There is a small hiatal hernia.   Lungs/Pleura: Atelectasis is present at the lung bases bilaterally. No effusion or pneumothorax. Subpleural reticulations are noted in the anterior aspect of the left upper lobe, likely related to prior radiation therapy. No pulmonary nodule or mass is seen.   Upper Abdomen: The gallbladder is surgically absent. Bochdalek fat containing hernia is noted on the left.   Musculoskeletal: Postsurgical changes are present in the left breast. Degenerative changes are present in the thoracic spine. No acute or suspicious osseous abnormality.   IMPRESSION: 1. No acute intrathoracic process. 2. No pulmonary nodule or mass. 3. 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 Recommend annual imaging followup by CTA or MRA. This  recommendation follows 2010 ACCF/AHA/AATS/ACR/ASA/SCA/SCAI/SIR/STS/SVM Guidelines for the Diagnosis and Management of Patients with Thoracic Aortic Disease. Circulation. 2010; 121: Z610-R604. Aortic aneurysm NOS (ICD10-I71.9) 4. Coronary artery calcifications.     Electronically Signed   By: Thornell Sartorius M.D.   On: 07/03/2022 04:54    Assessment & Plan   1.  Ascending aortic aneurysm-denies recent episodes of chest or back pain.  CT angio chest 2/24 showed 48 x 48 x 49 sinus of Valsalva. Repeat CT angio chest on 07/03/23 Maintain good blood pressure Continue current medical therapy  DOE-breathing stable. Increase physical activity as tolerated Heart healthy low-sodium diet Continue furosemide  Essential hypertension-BP today 126/64. Maintain blood pressure log Continue low-sodium diet  Hyperlipidemia-LDL 58. High-fiber diet Continue lovastatin Increase physical activity as tolerated   Bilateral lower extremity edema-generalized bilateral lower extremity edema right greater than left. Lower extremity support stockings Low-sodium diet Continue furosemide, potassium   Disposition: Follow-up with Dr. Jacques Navy or me in 12 months.   Thomasene Ripple. Leslee Suire NP-C     01/25/2023, 3:49 PM [facility] [address] Office [phone] Fax [phone]    I spent 14 minutes examining this patient, reviewing medications, and using patient centered shared decision making involving her cardiac care.  Prior to her visit I spent greater than 20 minutes reviewing her past medical history,  medications, and prior cardiac tests.

## 2023-01-22 NOTE — Telephone Encounter (Signed)
Received on (01/14/2023) via of fax DME Standard Written Order from [facility].  Requesting signature and return.  Given to provider to sign.  DME Standard Written Order signed and faxed back to [facility].  Confirmation received and copy scanned into the chart.//AB/CMA

## 2023-01-22 NOTE — Telephone Encounter (Signed)
LVM

## 2023-01-25 ENCOUNTER — Encounter: Payer: Self-pay | Admitting: General Practice

## 2023-01-25 ENCOUNTER — Ambulatory Visit: Payer: Medicare Other | Attending: General Practice | Admitting: General Practice

## 2023-01-25 ENCOUNTER — Telehealth: Payer: Self-pay | Admitting: *Deleted

## 2023-01-25 VITALS — BP 126/64 | HR 73 | Ht 65.0 in | Wt 215.0 lb

## 2023-01-25 DIAGNOSIS — R0609 Other forms of dyspnea: Secondary | ICD-10-CM

## 2023-01-25 DIAGNOSIS — E782 Mixed hyperlipidemia: Secondary | ICD-10-CM

## 2023-01-25 DIAGNOSIS — I7121 Aneurysm of the ascending aorta, without rupture: Secondary | ICD-10-CM | POA: Diagnosis not present

## 2023-01-25 DIAGNOSIS — R600 Localized edema: Secondary | ICD-10-CM

## 2023-01-25 DIAGNOSIS — I10 Essential (primary) hypertension: Secondary | ICD-10-CM | POA: Diagnosis not present

## 2023-01-25 MED ORDER — NITROGLYCERIN 0.4 MG SL SUBL: 0.4000 mg | SUBLINGUAL_TABLET | SUBLINGUAL | 1 refills | Status: AC | PRN

## 2023-01-25 NOTE — Telephone Encounter (Signed)
Faxed order,demographics,insurance information,and recent office notes to [facility] [employer] [facility].  Confirmation received and copy scanned into the chart.//AB/CMA 

## 2023-01-25 NOTE — Patient Instructions (Addendum)
Medication Instructions:  The current medical regimen is effective;  continue present plan and medications as directed. Please refer to the Current Medication list given to you today.  *If you need a refill on your cardiac medications before your next appointment, please call your pharmacy*  Lab Work: NON-FASTING BMET 2-3 DAY PRIOT If you have labs (blood work) drawn today and your tests are completely normal, you will receive your results only by: MyChart Message (if you have MyChart) OR A paper copy in the mail If you have any lab test that is abnormal or we need to change your treatment, we will call you to review the results.  Follow-Up: At [facility], you and your health needs are our priority.  As part of our continuing mission to provide you with exceptional heart care, we have created designated Provider Care Teams.  These Care Teams include your primary Cardiologist (physician) and Advanced Practice Providers (APPs -  Physician Assistants and Nurse Practitioners) who all work together to provide you with the care you need, when you need it.  We recommend signing up for the patient portal called "MyChart".  Sign up information is provided on this After Visit Summary.  MyChart is used to connect with patients for Virtual Visits (Telemedicine).  Patients are able to view lab/test results, encounter notes, upcoming appointments, etc.  Non-urgent messages can be sent to your provider as well.   To learn more about what you can do with MyChart, go to [email or web address].    Your next appointment:   12 month(s)  Provider:   Parke Poisson, MD  or Edd Fabian, FNP-C         Other Instructions MAINTAIN PHYSICAL ACTIVITY PLEASE READ AND FOLLOW ATTACHED  SALTY 6    Testing/Procedures:   Your CT ANGIO CHEST AORTA will be scheduled at one of the below locations:   [facility] [address] [place], [place] [address] [phone]  If scheduled at  [facility], please arrive at the [facility] and Children's Entrance (Entrance C2) of [facility] 30 minutes prior to test start time. You can use the FREE valet parking offered at entrance C (encouraged to control the heart rate for the test)  Proceed to the [facility] Department (first floor) to check-in and test prep.  All radiology patients and guests should use entrance C2 at [facility], accessed from [facility], even though the hospital's physical address listed is [address].    If scheduled at [facility] or [facility], please arrive 15 mins early for check-in and test prep.  There is spacious parking and easy access to the radiology department from the [facility] Heart and Vascular entrance. Please enter here and check-in with the desk attendant.   Please follow these instructions carefully (unless otherwise directed):  An IV will be required for this test and Nitroglycerin will be given.  Hold all erectile dysfunction medications at least 3 days (72 hrs) prior to test. (Ie viagra, cialis, sildenafil, tadalafil, etc)   On the Night Before the Test: Be sure to Drink plenty of water. Do not consume any caffeinated/decaffeinated beverages or chocolate 12 hours prior to your test.  On the Day of the Test: Drink plenty of water until 1 hour prior to the test. Do not eat any food 1 hour prior to test. You may take your regular medications prior to the test.  FEMALES- please wear underwire-free  bra if available, avoid dresses & tight clothing  After the Test: Drink plenty of water. After receiving IV contrast, you may experience a mild flushed feeling. This is normal. On occasion, you may experience a mild rash up to 24 hours after the test. This is not dangerous. If this occurs, you can take Benadryl 25 mg and increase your fluid intake. If you experience trouble breathing, this can be  serious. If it is severe call 911 IMMEDIATELY. If it is mild, please call our office. If you take any of these medications: Glipizide/Metformin, Avandament, Glucavance, please do not take 48 hours after completing test unless otherwise instructed.  We will call to schedule your test 2-4 weeks out understanding that some insurance companies will need an authorization prior to the service being performed.   For more information and frequently asked questions, please visit our website : [email or web address]

## 2023-01-25 NOTE — Addendum Note (Signed)
Addended by: Alyson Ingles on: 01/25/2023 04:26 PM   Modules accepted: Orders

## 2023-01-29 ENCOUNTER — Ambulatory Visit (HOSPITAL_BASED_OUTPATIENT_CLINIC_OR_DEPARTMENT_OTHER): Admit: 2023-01-29 | Payer: Medicare Other | Admitting: Plastic Surgery

## 2023-01-29 ENCOUNTER — Encounter (HOSPITAL_BASED_OUTPATIENT_CLINIC_OR_DEPARTMENT_OTHER): Payer: Self-pay

## 2023-01-29 SURGERY — RECONSTRUCTION, BREAST
Anesthesia: General | Site: Breast | Laterality: Left

## 2023-02-08 ENCOUNTER — Encounter: Payer: Medicare Other | Admitting: Surgical

## 2023-02-09 DIAGNOSIS — Z23 Encounter for immunization: Secondary | ICD-10-CM | POA: Diagnosis not present

## 2023-02-15 ENCOUNTER — Encounter: Payer: Medicare Other | Admitting: Plastic Surgery

## 2023-02-24 ENCOUNTER — Ambulatory Visit: Payer: Medicare Other | Admitting: Plastic Surgery

## 2023-02-24 ENCOUNTER — Encounter: Payer: Medicare Other | Admitting: Plastic Surgery

## 2023-03-03 ENCOUNTER — Encounter: Payer: Self-pay | Admitting: Gastroenterology

## 2023-03-03 ENCOUNTER — Ambulatory Visit (INDEPENDENT_AMBULATORY_CARE_PROVIDER_SITE_OTHER): Payer: Medicare Other | Admitting: Gastroenterology

## 2023-03-03 VITALS — BP 122/76 | HR 73 | Ht 65.0 in | Wt 215.6 lb

## 2023-03-03 DIAGNOSIS — K219 Gastro-esophageal reflux disease without esophagitis: Secondary | ICD-10-CM

## 2023-03-03 DIAGNOSIS — Z8719 Personal history of other diseases of the digestive system: Secondary | ICD-10-CM | POA: Diagnosis not present

## 2023-03-03 MED ORDER — PANTOPRAZOLE SODIUM 40 MG PO TBEC: DELAYED_RELEASE_TABLET | ORAL | 3 refills | Status: DC

## 2023-03-03 MED ORDER — DICYCLOMINE HCL 20 MG PO TABS: 20.0000 mg | ORAL_TABLET | Freq: Three times a day (TID) | ORAL | 3 refills | Status: AC

## 2023-03-03 MED ORDER — FAMOTIDINE 40 MG PO TABS: 40.0000 mg | ORAL_TABLET | Freq: Every day | ORAL | 3 refills | Status: AC

## 2023-03-03 NOTE — Progress Notes (Signed)
    Assessment     GERD with a medium-sized HH and an esophageal stricture Gastroparesis, impacting GERD Diarrhea with intermittent fecal incontinence   Recommendations    Continue pantoprazole 40 mg bid, famotidine 40 mg hs, follow antireflux measures Follow gastroparesis diet Dicyclomine 20 mg po tid prn Imodium 1-2 mg po tid prn Kegel exercises 5 times/day long term REV in 1 year    HPI    This is a 77 year old female returning for follow-up of GERD and intermittent diarrhea.  Reflux symptoms remain under good but not excellent control.  Her symptoms are stable.  She has frequent nocturnal symptoms and tries to eat her evening meal prior to 6 PM.  She has intermittent diarrhea brought on by certain foods and stress.  Dicyclomine has been helpful to reduce symptoms.   Labs / Imaging       Latest Ref Rng & Units 01/05/2023    9:43 AM 08/04/2022    3:42 PM 06/02/2022    3:15 PM  Hepatic Function  Total Protein 6.0 - 8.3 g/dL 7.7  7.5  7.2   Albumin 3.5 - 5.2 g/dL 4.3  4.0  3.7   AST 0 - 37 U/L 37  30  34   ALT 0 - 35 U/L 26  20  21    Alk Phosphatase 39 - 117 U/L 69  76  67   Total Bilirubin 0.2 - 1.2 mg/dL 0.7  0.7  0.8        Latest Ref Rng & Units 01/05/2023    9:43 AM 08/04/2022    3:42 PM 06/02/2022    3:15 PM  CBC  WBC 4.0 - 10.5 K/uL 4.0  4.9  5.0   Hemoglobin 12.0 - 15.0 g/dL 47.8  29.5  9.4   Hematocrit 36.0 - 46.0 % 34.0  32.9  28.4   Platelets 150.0 - 400.0 K/uL 266.0  233.0  268.0     Current Medications, Allergies, Past Medical History, Past Surgical History, Family History and Social History were reviewed in [employer] record.   Physical Exam: General: Well developed, well nourished, no acute distress Head: Normocephalic and atraumatic Eyes: Sclerae anicteric, EOMI Ears: Normal auditory acuity Mouth: No deformities or lesions noted Lungs: Clear throughout to auscultation Heart: Regular rate and rhythm; No murmurs, rubs or  bruits Abdomen: Soft, non tender and non distended. No masses, hepatosplenomegaly or hernias noted. Normal Bowel sounds Rectal: Not done Musculoskeletal: Symmetrical with no gross deformities  Pulses:  Normal pulses noted Extremities: No edema or deformities noted Neurological: Alert oriented x 4, grossly nonfocal Psychological:  Alert and cooperative. Normal mood and affect   Donnie Gedeon T. Russella Dar, MD 03/03/2023, 10:43 AM

## 2023-03-03 NOTE — Patient Instructions (Signed)
We have sent the following prescriptions to your mail in pharmacy: dicyclomine, pantoprazole and famotidine.   If you have not heard from your mail in pharmacy within 1 week or if you have not received your medication in the mail, please contact us at [phone] so we may find out why.  You can take over the counter Imodium 1-2 tablets by mouth three times a day for diarrhea symptoms.   Follow up with Dr. Doy Hutching.  The [facility] providers would like to encourage you to use [facility] to communicate with providers for non-urgent requests or questions.  Due to long hold times on the telephone, sending your provider a message by [facility] may be a faster and more efficient way to get a response.  Please allow 48 business hours for a response.  Please remember that this is for non-urgent requests.   Thank you for choosing me and [facility].  Venita Lick. Pleas Koch., MD., Clementeen Graham

## 2023-03-11 ENCOUNTER — Encounter: Payer: Medicare Other | Admitting: Surgical

## 2023-03-17 DIAGNOSIS — N2581 Secondary hyperparathyroidism of renal origin: Secondary | ICD-10-CM | POA: Diagnosis not present

## 2023-03-17 DIAGNOSIS — N1831 Chronic kidney disease, stage 3a: Secondary | ICD-10-CM | POA: Diagnosis not present

## 2023-03-17 DIAGNOSIS — D631 Anemia in chronic kidney disease: Secondary | ICD-10-CM | POA: Diagnosis not present

## 2023-03-17 DIAGNOSIS — I129 Hypertensive chronic kidney disease with stage 1 through stage 4 chronic kidney disease, or unspecified chronic kidney disease: Secondary | ICD-10-CM | POA: Diagnosis not present

## 2023-03-17 LAB — LAB REPORT - SCANNED: EGFR: 58

## 2023-04-09 ENCOUNTER — Ambulatory Visit (INDEPENDENT_AMBULATORY_CARE_PROVIDER_SITE_OTHER): Payer: Medicare Other | Admitting: Internal Medicine

## 2023-04-09 ENCOUNTER — Encounter: Payer: Self-pay | Admitting: Internal Medicine

## 2023-04-09 DIAGNOSIS — F339 Major depressive disorder, recurrent, unspecified: Secondary | ICD-10-CM | POA: Insufficient documentation

## 2023-04-09 DIAGNOSIS — I10 Essential (primary) hypertension: Secondary | ICD-10-CM

## 2023-04-09 DIAGNOSIS — I4902 Ventricular flutter: Secondary | ICD-10-CM | POA: Insufficient documentation

## 2023-04-09 DIAGNOSIS — E890 Postprocedural hypothyroidism: Secondary | ICD-10-CM

## 2023-04-09 DIAGNOSIS — R7303 Prediabetes: Secondary | ICD-10-CM | POA: Diagnosis not present

## 2023-04-09 LAB — CBC
HCT: 30.4 % — ABNORMAL LOW (ref 36.0–46.0)
Hemoglobin: 10.1 g/dL — ABNORMAL LOW (ref 12.0–15.0)
MCHC: 33.3 g/dL (ref 30.0–36.0)
MCV: 91.9 fL (ref 78.0–100.0)
Platelets: 252 10*3/uL (ref 150.0–400.0)
RBC: 3.31 Mil/uL — ABNORMAL LOW (ref 3.87–5.11)
RDW: 14.1 % (ref 11.5–15.5)
WBC: 5.6 10*3/uL (ref 4.0–10.5)

## 2023-04-09 LAB — COMPREHENSIVE METABOLIC PANEL
ALT: 41 U/L — ABNORMAL HIGH (ref 0–35)
AST: 48 U/L — ABNORMAL HIGH (ref 0–37)
Albumin: 4.1 g/dL (ref 3.5–5.2)
Alkaline Phosphatase: 65 U/L (ref 39–117)
BUN: 18 mg/dL (ref 6–23)
CO2: 27 meq/L (ref 19–32)
Calcium: 9.1 mg/dL (ref 8.4–10.5)
Chloride: 104 meq/L (ref 96–112)
Creatinine, Ser: 0.98 mg/dL (ref 0.40–1.20)
GFR: 55.09 mL/min — ABNORMAL LOW (ref >60.00)
Glucose, Bld: 85 mg/dL (ref 70–99)
Potassium: 3.8 meq/L (ref 3.5–5.1)
Sodium: 140 meq/L (ref 135–145)
Total Bilirubin: 0.7 mg/dL (ref 0.2–1.2)
Total Protein: 7.2 g/dL (ref 6.0–8.3)

## 2023-04-09 LAB — T4, FREE: Free T4: 0.59 ng/dL — ABNORMAL LOW (ref 0.60–1.60)

## 2023-04-09 LAB — MAGNESIUM: Magnesium: 1.1 mg/dL — ABNORMAL LOW (ref 1.5–2.5)

## 2023-04-09 LAB — TSH: TSH: 17.11 u[IU]/mL — ABNORMAL HIGH (ref 0.35–5.50)

## 2023-04-09 MED ORDER — METFORMIN HCL 500 MG PO TABS
500.0000 mg | ORAL_TABLET | Freq: Every day | ORAL | 3 refills | Status: DC
Start: 2023-04-09 — End: 2023-10-04

## 2023-04-09 NOTE — Assessment & Plan Note (Signed)
Will defer HGA1c today and start metformin 500 mg daily for pre-diabetes and to encourage weight loss.

## 2023-04-09 NOTE — Progress Notes (Signed)
   Subjective:   Patient ID: [name], female    DOB: 12-29-1945, 77 y.o.   MRN: [id number]  HPI The patient is a 77 YO female coming in for weight loss and follow up. Changed thyroid medication dose and cannot tell much difference.   Review of Systems  Constitutional:  Positive for unexpected weight change.  HENT: Negative.    Eyes: Negative.   Respiratory:  Negative for cough, chest tightness and shortness of breath.   Cardiovascular:  Negative for chest pain, palpitations and leg swelling.  Gastrointestinal:  Negative for abdominal distention, abdominal pain, constipation, diarrhea, nausea and vomiting.  Musculoskeletal: Negative.   Skin: Negative.   Neurological: Negative.   Psychiatric/Behavioral: Negative.      Objective:  Physical Exam Constitutional:      Appearance: She is well-developed.  HENT:     Head: Normocephalic and atraumatic.  Cardiovascular:     Rate and Rhythm: Normal rate and regular rhythm.  Pulmonary:     Effort: Pulmonary effort is normal. No respiratory distress.     Breath sounds: Normal breath sounds. No wheezing or rales.  Abdominal:     General: Bowel sounds are normal. There is no distension.     Palpations: Abdomen is soft.     Tenderness: There is no abdominal tenderness. There is no rebound.  Musculoskeletal:     Cervical back: Normal range of motion.  Skin:    General: Skin is warm and dry.  Neurological:     Mental Status: She is alert and oriented to person, place, and time.     Coordination: Coordination normal.     Vitals:   04/09/23 0855 04/09/23 0859  BP: (!) 158/80 (!) 158/80  Pulse: 86   Temp: 98.2 F (36.8 C)   TempSrc: Oral   SpO2: 96%   Weight: 214 lb (97.1 kg)   Height: 5\' 5"  (1.651 m)     Assessment & Plan:

## 2023-04-09 NOTE — Patient Instructions (Signed)
We will check the labs today and sent in metformin to take 1 pill daily to help with weight loss. It can take 1-2 months for this to kick in.

## 2023-04-09 NOTE — Assessment & Plan Note (Signed)
BP mildly elevated today and did not take lasix due to travel. Previously at goal and under control at home.

## 2023-04-09 NOTE — Assessment & Plan Note (Signed)
Checking magnesium and taking otc not consistently. Adjust as needed.

## 2023-04-09 NOTE — Assessment & Plan Note (Signed)
BMI 35.6 and complicated by hyperlipidemia and hypertension and pre-diabetes. Starting metformin to address metabolic disturbance and work on diet and exercise.

## 2023-04-09 NOTE — Assessment & Plan Note (Signed)
Checking TSH and free T4 and adjust levothyroxine 200 mcg daily as needed.

## 2023-04-13 ENCOUNTER — Telehealth: Payer: Self-pay | Admitting: Internal Medicine

## 2023-04-13 ENCOUNTER — Other Ambulatory Visit: Payer: Self-pay | Admitting: Internal Medicine

## 2023-04-13 MED ORDER — MAGNESIUM ASPARTATE HCL 615 MG PO TBEC: 615.0000 mg | DELAYED_RELEASE_TABLET | Freq: Two times a day (BID) | ORAL | 3 refills | Status: AC

## 2023-04-13 MED ORDER — LEVOTHYROXINE SODIUM 25 MCG PO TABS: 25.0000 ug | ORAL_TABLET | Freq: Every day | ORAL | 3 refills | Status: AC

## 2023-04-13 MED ORDER — LEVOTHYROXINE SODIUM 200 MCG PO TABS: 200.0000 ug | ORAL_TABLET | Freq: Every day | ORAL | 3 refills | Status: AC

## 2023-04-13 NOTE — Telephone Encounter (Signed)
Spoke with patient regarding her labs.

## 2023-04-13 NOTE — Telephone Encounter (Signed)
Patient returned Micaiah's call and would like a call back at [phone].

## 2023-04-21 ENCOUNTER — Telehealth: Payer: Self-pay | Admitting: Internal Medicine

## 2023-04-21 NOTE — Telephone Encounter (Signed)
Patient picked up her prescriptions from the pharmacy and saw that she had two different dosages of her Synthroid. She has one that is 200 mcg and one is 25 mcg. Patient would like to know what she is to do. She would like a call back at [phone].

## 2023-04-22 NOTE — Telephone Encounter (Signed)
Based off your notes I read take 200mg  is she suppose to take the 25 as well?

## 2023-04-22 NOTE — Telephone Encounter (Signed)
Yes take both.

## 2023-04-22 NOTE — Telephone Encounter (Signed)
Patient called back and would like for cma to call her.

## 2023-04-22 NOTE — Telephone Encounter (Signed)
Called pt back and explained to also take the 25mg  as well

## 2023-06-07 ENCOUNTER — Telehealth: Payer: Self-pay

## 2023-06-07 NOTE — Telephone Encounter (Signed)
 Copied from CRM [id number]. Topic: Clinical - Medication Refill >> Jun 07, 2023  1:14 PM Tiffany H wrote: Most Recent Primary Care Visit:  Provider: ROLLENE NORRIS A  Department: [facility]  Visit Type: OFFICE VISIT  Date: 04/09/2023  Medication: allopurinol  (ZYLOPRIM ) 100 MG tablet  Has the patient contacted their pharmacy? Yes (Agent: If no, request that the patient contact the pharmacy for the refill. If patient does not wish to contact the pharmacy document the reason why and proceed with request.) (Agent: If yes, when and what did the pharmacy advise?)  Is this the correct pharmacy for this prescription? Yes If no, delete pharmacy and type the correct one.  This is the patient's preferred pharmacy:   [facility] - [place], [place] - [address] [facility] [address] [place] [address] [place] [address] Phone: [phone] Fax: [phone]  Has the prescription been filled recently? Yes  Is the patient out of the medication? No.   Has the patient been seen for an appointment in the last year OR does the patient have an upcoming appointment? Yes  Can we respond through MyChart? No  Agent: Please be advised that Rx refills may take up to 3 business days. We ask that you follow-up with your pharmacy.

## 2023-06-07 NOTE — Telephone Encounter (Signed)
Appropriate for refills ?

## 2023-06-07 NOTE — Telephone Encounter (Signed)
 Ok for refill?

## 2023-06-08 ENCOUNTER — Other Ambulatory Visit: Payer: Self-pay

## 2023-06-08 MED ORDER — ALLOPURINOL 100 MG PO TABS: 200.0000 mg | ORAL_TABLET | Freq: Every day | ORAL | 3 refills | Status: AC

## 2023-06-08 NOTE — Telephone Encounter (Signed)
 Done

## 2023-07-15 ENCOUNTER — Ambulatory Visit (HOSPITAL_COMMUNITY): Admission: RE | Admit: 2023-07-15 | Payer: Medicare Other | Source: Ambulatory Visit

## 2023-07-23 ENCOUNTER — Other Ambulatory Visit: Payer: Self-pay | Admitting: Family Medicine

## 2023-07-23 NOTE — Telephone Encounter (Signed)
 Last OV 07/28/22 Next OV not scheduled   Last refill  11/13/22 Qty #180/1  Pt has not been seen in almost 1 year, will need to schedule f/u appointment before next refill request.   Forwarding request to Dr. Denyse Amass.

## 2023-08-30 ENCOUNTER — Encounter: Payer: Self-pay | Admitting: Podiatry

## 2023-08-30 ENCOUNTER — Ambulatory Visit (INDEPENDENT_AMBULATORY_CARE_PROVIDER_SITE_OTHER): Admitting: Podiatry

## 2023-08-30 DIAGNOSIS — D2372 Other benign neoplasm of skin of left lower limb, including hip: Secondary | ICD-10-CM | POA: Diagnosis not present

## 2023-08-30 DIAGNOSIS — M2042 Other hammer toe(s) (acquired), left foot: Secondary | ICD-10-CM

## 2023-08-30 NOTE — Progress Notes (Signed)
 Chief Complaint  Patient presents with   Callouses    Trim calluses - 5th toe and plantar forefoot left     Subjective: 78 y.o. female presenting to the office today for evaluation pain and tenderness associated to the skin symptomatic skin lesion to the fifth digit of the left foot as well as the plantar forefoot.   Past Medical History:  Diagnosis Date   Allergy    ANEMIA, IRON DEFICIENCY 05/08/2009   Angina    ASYMPTOMATIC POSTMENOPAUSAL STATUS 10/11/2008   Blood transfusion    Blood transfusion without reported diagnosis    Breast cancer (HCC) 09/29/2011   invasive grade III ductal ca,assoc high grade dcis,ER/PR=neg; left breast   C. difficile colitis    Cataract    Diverticulosis of colon (without mention of hemorrhage)    DVT (deep venous thrombosis) (HCC)    08/21/22 "YEARS AGO"   Esophageal reflux 06/12/2008   Gastroparesis    GOITER, MULTINODULAR 04/02/2009   Gout, unspecified    H/O hiatal hernia    History of kidney stones    History of lower GI bleeding    History of radiation therapy 02/08/12-03/25/12   left breast,total 61gy   Hypokalemia 05/11/2013   Hypomagnesemia    HYPOTHYROIDISM, POST-RADIATION 08/13/2009   Internal hemorrhoids without mention of complication    Kidney stones    "several"   Leukopenia    Migraines    Neuropathy    Obesity    On supplemental oxygen therapy    at night while sleeping - patient did not want to undergo sleep study   Osteoarthrosis, unspecified whether generalized or localized, unspecified site    Other and unspecified hyperlipidemia    Personal history of chemotherapy 2013   Personal history of radiation therapy 2013   left   PONV (postoperative nausea and vomiting)    PUD (peptic ulcer disease)    Short bowel syndrome    Shortness of breath on exertion    "sometimes"   Stricture and stenosis of esophagus    Thyrotoxicosis without mention of goiter or other cause, without mention of thyrotoxic crisis or storm     Type II or unspecified type diabetes mellitus without mention of complication, not stated as uncontrolled    no med in years diet controled   Unspecified essential hypertension    UTI (urinary tract infection)    Varicose veins    VITAMIN B12 DEFICIENCY 08/30/2009    Past Surgical History:  Procedure Laterality Date   ABDOMINAL ADHESION SURGERY  1980's thru 1990's   "several"   ABDOMINAL HYSTERECTOMY  1970's   with BSO   BREAST BIOPSY Left 08/13/11   left breast lower inner quadrant   BREAST BIOPSY Right 1985   Rt exc bx, benign   BREAST LUMPECTOMY Left 08/2011   BREAST LUMPECTOMY W/ NEEDLE LOCALIZATION  09/29/11   left  breast=lymph node,excision benign/ ER/PR=neg, her 2 Positive   BREAST REDUCTION SURGERY Bilateral 06/09/2021   Procedure: MAMMARY REDUCTION  (BREAST);  Surgeon: Allena Napoleon, MD;  [facility] OR;  Service: Plastics;  Laterality: Bilateral;  2 hours   BUNIONECTOMY  1970's   bilateral   CHOLECYSTECTOMY  1990's   COLON SURGERY     "several surgeries for short bowel syndrome"   COLONOSCOPY  2012   multiple    DILATION AND CURETTAGE OF UTERUS     ESOPHAGOGASTRODUODENOSCOPY  2011   multiple    EXCISIONAL HEMORRHOIDECTOMY  11/10/2016   EYE SURGERY     "  long time ago"   FLEXIBLE SIGMOIDOSCOPY  2011   multiple    KIDNEY STONE SURGERY  1990's   "tried to go up & get it but pushed it further up"   LITHOTRIPSY     "4 or 5 times"   MASS EXCISION Left 08/28/2022   Procedure: EXCISION TISSUE OF LEFT BREAST;  Surgeon: Santiago Glad, MD;  Location: [facility] OR;  Service: Plastics;  Laterality: Left;   MASTECTOMY W/ NODES PARTIAL  09/29/11   left   PORT-A-CATH REMOVAL Right 12/19/2013   Procedure: MINOR REMOVAL PORT-A-CATH;  Surgeon: Ernestene Mention, MD;  Location: [facility];  Service: General;  Laterality: Right;   PORTACATH PLACEMENT  09/29/2011   Procedure: INSERTION PORT-A-CATH;  Surgeon: Ernestene Mention, MD;  Location: [facility] OR;  Service: General;   Laterality: N/A;   SMALL INTESTINE SURGERY     Thyroid Ultrasound  12/1994 and 12/1995   TOTAL KNEE ARTHROPLASTY Right 06/05/2015   Procedure: RIGHT TOTAL KNEE ARTHROPLASTY;  Surgeon: Loreta Ave, MD;  Location: [facility] OR;  Service: Orthopedics;  Laterality: Right;   UPPER GASTROINTESTINAL ENDOSCOPY     VEIN LIGATION AND STRIPPING  1980's   Right leg    Allergies  Allergen Reactions   Aspirin Other (See Comments)    REACTION: Gi Intolerance/ Burning in stomach   Trazodone And Nefazodone Nausea And Vomiting    "sick"   Flagyl [Metronidazole] Rash   Morphine And Codeine Itching     Objective:  Physical Exam General: Alert and oriented x3 in no acute distress  Dermatology: Hyperkeratotic lesion(s) present on the fifth digit left foot plantar forefoot left. Pain on palpation with a central nucleated core noted. Skin is warm, dry and supple bilateral lower extremities. Negative for open lesions or macerations.  Vascular: Palpable pedal pulses bilaterally. No edema or erythema noted. Capillary refill within normal limits.  Neurological: Grossly intact via light touch  Musculoskeletal Exam: Pain on palpation at the keratotic lesion(s) noted. Range of motion within normal limits bilateral. Muscle strength 5/5 in all groups bilateral.  Hammertoe contracture noted to the fifth digit of the left foot  Assessment: 1.  Eccrine poroma fifth digit left 2.  Eccrine poroma plantar aspect of the left forefoot 3.  Hammertoe contracture deformity fifth digit left 4.  History of PIPJ arthroplasty with derotational skin plasty fifth digit right    Plan of Care:  -Patient evaluated -Excisional debridement of keratoic lesion(s) using a chisel blade was performed without incident.  -Salicylic acid applied with a bandaid - Recommend wide fitting shoes that do not irritate or constrict the toebox area -If the symptomatic skin lesion to the fifth digit of the left foot recurs she may benefit from  arthroplasty of the toe.  She had this procedure done several years ago to the right foot without any complication and it is healed nicely. -Return to clinic as needed  Felecia Shelling, DPM [facility]  Dr. Felecia Shelling, DPM    [address] [place], [place] [address]                Office [phone]  Fax [phone]

## 2023-09-14 DIAGNOSIS — I10 Essential (primary) hypertension: Secondary | ICD-10-CM | POA: Diagnosis not present

## 2023-09-14 DIAGNOSIS — N2581 Secondary hyperparathyroidism of renal origin: Secondary | ICD-10-CM | POA: Diagnosis not present

## 2023-09-14 DIAGNOSIS — D631 Anemia in chronic kidney disease: Secondary | ICD-10-CM | POA: Diagnosis not present

## 2023-09-14 DIAGNOSIS — N189 Chronic kidney disease, unspecified: Secondary | ICD-10-CM | POA: Diagnosis not present

## 2023-09-14 DIAGNOSIS — I129 Hypertensive chronic kidney disease with stage 1 through stage 4 chronic kidney disease, or unspecified chronic kidney disease: Secondary | ICD-10-CM | POA: Diagnosis not present

## 2023-09-14 DIAGNOSIS — N1831 Chronic kidney disease, stage 3a: Secondary | ICD-10-CM | POA: Diagnosis not present

## 2023-09-27 ENCOUNTER — Telehealth: Payer: Self-pay | Admitting: Internal Medicine

## 2023-09-27 ENCOUNTER — Ambulatory Visit (INDEPENDENT_AMBULATORY_CARE_PROVIDER_SITE_OTHER): Admitting: Family Medicine

## 2023-09-27 ENCOUNTER — Other Ambulatory Visit: Payer: Self-pay | Admitting: Internal Medicine

## 2023-09-27 ENCOUNTER — Encounter: Payer: Self-pay | Admitting: Family Medicine

## 2023-09-27 VITALS — BP 128/72 | HR 78 | Temp 98.0°F | Ht 65.0 in | Wt 217.0 lb

## 2023-09-27 DIAGNOSIS — K90829 Short bowel syndrome, unspecified: Secondary | ICD-10-CM | POA: Diagnosis not present

## 2023-09-27 DIAGNOSIS — Z6836 Body mass index (BMI) 36.0-36.9, adult: Secondary | ICD-10-CM | POA: Diagnosis not present

## 2023-09-27 MED ORDER — ONDANSETRON HCL 4 MG PO TABS: 4.0000 mg | ORAL_TABLET | Freq: Three times a day (TID) | ORAL | 0 refills | Status: DC | PRN

## 2023-09-27 MED ORDER — TRAMADOL HCL 50 MG PO TABS: 50.0000 mg | ORAL_TABLET | Freq: Two times a day (BID) | ORAL | 0 refills | Status: AC | PRN

## 2023-09-27 NOTE — Telephone Encounter (Signed)
 Copied from CRM [id number]. Topic: Clinical - Medication Refill >> Sep 27, 2023 10:18 AM Allyne Areola wrote: Most Recent Primary Care Visit:  Provider: Zorita Hiss  Department: [facility]  Visit Type: OFFICE VISIT  Date: 09/27/2023  Medication: dapagliflozin 10 mg  Has the patient contacted their pharmacy? Yes, they require a new prescription  (Agent: If no, request that the patient contact the pharmacy for the refill. If patient does not wish to contact the pharmacy document the reason why and proceed with request.) (Agent: If yes, when and what did the pharmacy advise?)  Is this the correct pharmacy for this prescription? Yes If no, delete pharmacy and type the correct one.  This is the patient's preferred pharmacy:  [facility] - [place], [place] - [address] [facility] [address] [place] [address] [place] [address] Phone: [phone] Fax: [phone]     Has the prescription been filled recently? No  Is the patient out of the medication? No  Has the patient been seen for an appointment in the last year OR does the patient have an upcoming appointment? Yes  Can we respond through MyChart? No   Agent: Please be advised that Rx refills may take up to 3 business days. We ask that you follow-up with your pharmacy.

## 2023-09-27 NOTE — Progress Notes (Signed)
 Assessment & Plan:  1. 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  - traMADol  (ULTRAM ) 50 MG tablet; Take 1 tablet (50 mg total) by mouth every 12 (twelve) hours as needed for moderate pain (pain score 4-6) or severe pain (pain score 7-10).  Dispense: 15 tablet; Refill: 0 - ondansetron  (ZOFRAN ) 4 MG tablet; Take 1 tablet (4 mg total) by mouth every 8 (eight) hours as needed for nausea or vomiting.  Dispense: 20 tablet; Refill: 0  2. BMI 36.0-36.9,adult Encouraged patient to discuss weight loss medication options next week with her PCP at her already scheduled chronic follow-up. Advised not to pursue e-mail she received as it is a virtual weight loss clinic. Explained the medications they use are compounded by them and not FDA regulated.    Follow up plan: Return for as scheduled with PCP.  Hershel Los, MSN, APRN, FNP-C  Subjective:  HPI: ISHMEET [name] is a 78 y.o. female presenting on 09/27/2023 for Nausea (Nausea for a couple of weeks. Pt denies constipation and vomiting. Pt thinks it may be related to feeling anxious. Feeling tearful and anxious. )  Patient reports intermittent nausea for the past couple of weeks that she feels is related to her anxiety. She has been feeling tearful as well. The tearfulness and anxiety increase due to unexpected diarrhea due to her short gut syndrome. She gets nauseated prior to having diarrhea. She denies constipation and vomiting. She reports having lab work completed with her kidney doctor within the past two weeks at which time she was advised to start taking magnesium  2 tablets twice daily. She denies any increase in diarrhea since starting the magnesium , but states it is the same. She does take Imodium  as needed, which is helpful.      09/27/2023    8:12 AM 01/05/2023    8:37 AM 12/16/2022   10:39 AM   Depression screen PHQ 2/9  Decreased Interest 3 2 2   Down, Depressed, Hopeless 1 1 1   PHQ - 2 Score 4 3 3   Altered sleeping 0 2 0  Tired, decreased energy 1 3 2   Change in appetite 1 1 3   Feeling bad or failure about yourself  2 0 0  Trouble concentrating 1 3 3   Moving slowly or fidgety/restless 0 1 2  Suicidal thoughts 0 0 0  PHQ-9 Score 9 13 13   Difficult doing work/chores Somewhat difficult Somewhat difficult Somewhat difficult      09/27/2023    8:13 AM 08/18/2022    9:40 AM  GAD 7 : Generalized Anxiety Score  Nervous, Anxious, on Edge 1 1  Control/stop worrying 0 2  Worry too much - different things 0 2  Trouble relaxing 1 0  Restless 1 0  Easily annoyed or irritable 1 0  Afraid - awful might happen 2 0  Total GAD 7 Score 6 5  Anxiety Difficulty Somewhat difficult     She is also requesting a refill of Tramadol  that she takes for chronic pain in her stomach.  Lastly, she has an e-mail regarding weight loss that she would like to discuss.     ROS: Negative unless specifically indicated above in HPI.   Relevant past medical history reviewed and updated as indicated.   Allergies and medications reviewed and updated.   Current Outpatient Medications:    albuterol  (VENTOLIN  HFA) 108 (90 Base) MCG/ACT inhaler, Inhale  2 puffs into the lungs every 6 (six) hours as needed for wheezing or shortness of breath., Disp: 54 g, Rfl: 4   allopurinol  (ZYLOPRIM ) 100 MG tablet, Take 2 tablets (200 mg total) by mouth daily., Disp: 180 tablet, Rfl: 3   amitriptyline  (ELAVIL ) 50 MG tablet, TAKE ONE TABLET BY MOUTH EVERY DAY AT BEDTIME (USE CAUTION - MAY CAUSE DROWSINESS), Disp: 90 tablet, Rfl: 3   Calcium  Carbonate-Vitamin D  (CALCIUM -VITAMIN D ) 600-200 MG-UNIT CAPS, Take 1 capsule by mouth daily., Disp: , Rfl:    cholecalciferol (VITAMIN D ) 25 MCG (1000 UNIT) tablet, Take 1,000 Units by mouth daily., Disp: , Rfl:    colchicine  0.6 MG tablet, Take 1 tablet (0.6 mg total) by mouth daily  as needed (gout or psuedogout pain)., Disp: 90 tablet, Rfl: 2   Cyanocobalamin  (VITAMIN B 12 PO), Take 1,000 mcg by mouth daily., Disp: , Rfl:    dicyclomine  (BENTYL ) 20 MG tablet, Take 1 tablet (20 mg total) by mouth 3 (three) times daily before meals., Disp: 270 tablet, Rfl: 3   famotidine  (PEPCID ) 40 MG tablet, Take 1 tablet (40 mg total) by mouth at bedtime., Disp: 90 tablet, Rfl: 3   ferrous sulfate  325 (65 FE) MG tablet, Take 1 tablet (325 mg total) by mouth daily with breakfast., Disp: 90 tablet, Rfl: 3   fluticasone  (FLONASE ) 50 MCG/ACT nasal spray, Place 2 sprays into both nostrils daily., Disp: 16 g, Rfl: 11   furosemide  (LASIX ) 40 MG tablet, Take 1 tablet (40 mg total) by mouth daily., Disp: 90 tablet, Rfl: 3   levothyroxine  (SYNTHROID ) 200 MCG tablet, Take 1 tablet (200 mcg total) by mouth daily., Disp: 90 tablet, Rfl: 3   lovastatin  (MEVACOR ) 20 MG tablet, TAKE ONE TABLET BY MOUTH EVERY DAY AT 6:00PM, Disp: 90 tablet, Rfl: 3   LYRICA  75 MG capsule, TAKE ONE CAPSULE BY MOUTH TWICE A DAY, Disp: 180 capsule, Rfl: 1   Multiple Vitamins-Minerals (MULTIVITAMIN WOMENS 50+ ADV PO), Take 1 tablet by mouth daily., Disp: , Rfl:    nitroGLYCERIN  (NITROSTAT ) 0.4 MG SL tablet, Place 1 tablet (0.4 mg total) under the tongue every 5 (five) minutes as needed., Disp: 25 tablet, Rfl: 1   ondansetron  (ZOFRAN ) 4 MG tablet, Take 1 tablet (4 mg total) by mouth every 8 (eight) hours as needed for up to 20 doses for nausea or vomiting., Disp: 20 tablet, Rfl: 0   ondansetron  (ZOFRAN ) 4 MG tablet, Take 1 tablet (4 mg total) by mouth every 8 (eight) hours as needed for nausea or vomiting., Disp: 20 tablet, Rfl: 0   pantoprazole  (PROTONIX ) 40 MG tablet, TAKE 1 TABLET BY MOUTH 30 MINUTES PRIOR TO BREAKFAST AND SUPPER, Disp: 180 tablet, Rfl: 3   potassium chloride  SA (KLOR-CON  M) 20 MEQ tablet, Take 1 tablet (20 mEq total) by mouth daily., Disp: 90 tablet, Rfl: 3   umeclidinium-vilanterol (ANORO ELLIPTA ) 62.5-25  MCG/ACT AEPB, Inhale 1 puff into the lungs daily., Disp: 180 each, Rfl: 4   levothyroxine  (SYNTHROID ) 25 MCG tablet, Take 1 tablet (25 mcg total) by mouth daily. (Patient not taking: Reported on 09/27/2023), Disp: 90 tablet, Rfl: 3   magnesium  aspartate (MAGINEX) 615 MG tablet, Take 1 tablet (615 mg total) by mouth 2 (two) times daily., Disp: 180 tablet, Rfl: 3   metFORMIN  (GLUCOPHAGE ) 500 MG tablet, Take 1 tablet (500 mg total) by mouth daily with breakfast. (Patient not taking: Reported on 09/27/2023), Disp: 90 tablet, Rfl: 3   traMADol  (ULTRAM ) 50 MG tablet, Take 1  tablet (50 mg total) by mouth every 8 (eight) hours as needed for up to 15 doses for moderate pain or severe pain. (Patient not taking: Reported on 09/27/2023), Disp: 15 tablet, Rfl: 0  Allergies  Allergen Reactions   Aspirin Other (See Comments)    REACTION: Gi Intolerance/ Burning in stomach   Trazodone  And Nefazodone Nausea And Vomiting    "sick"   Flagyl [Metronidazole] Rash   Morphine  And Codeine Itching    Objective:   BP 128/72   Pulse 78   Temp 98 F (36.7 C)   Ht 5\' 5"  (1.651 m)   Wt 217 lb (98.4 kg)   BMI 36.11 kg/m    Physical Exam Vitals reviewed.  Constitutional:      General: She is not in acute distress.    Appearance: Normal appearance. She is not ill-appearing, toxic-appearing or diaphoretic.  HENT:     Head: Normocephalic and atraumatic.  Eyes:     General: No scleral icterus.       Right eye: No discharge.        Left eye: No discharge.     Conjunctiva/sclera: Conjunctivae normal.  Cardiovascular:     Rate and Rhythm: Normal rate.  Pulmonary:     Effort: Pulmonary effort is normal. No respiratory distress.  Musculoskeletal:        General: Normal range of motion.     Cervical back: Normal range of motion.  Skin:    General: Skin is warm and dry.     Capillary Refill: Capillary refill takes less than 2 seconds.  Neurological:     General: No focal deficit present.     Mental Status: She  is alert and oriented to person, place, and time. Mental status is at baseline.  Psychiatric:        Mood and Affect: Mood normal.        Behavior: Behavior normal.        Thought Content: Thought content normal.        Judgment: Judgment normal.

## 2023-09-30 NOTE — Telephone Encounter (Signed)
 I do not see this on patient medication list

## 2023-10-01 NOTE — Telephone Encounter (Signed)
 Not on current med list should not be refilled.

## 2023-10-04 ENCOUNTER — Other Ambulatory Visit: Payer: Self-pay

## 2023-10-04 ENCOUNTER — Encounter: Payer: Self-pay | Admitting: Internal Medicine

## 2023-10-04 ENCOUNTER — Ambulatory Visit: Admitting: Internal Medicine

## 2023-10-04 ENCOUNTER — Telehealth: Payer: Self-pay | Admitting: Internal Medicine

## 2023-10-04 VITALS — BP 126/80 | HR 73 | Temp 97.6°F | Ht 65.0 in | Wt 217.0 lb

## 2023-10-04 DIAGNOSIS — E890 Postprocedural hypothyroidism: Secondary | ICD-10-CM | POA: Diagnosis not present

## 2023-10-04 DIAGNOSIS — E559 Vitamin D deficiency, unspecified: Secondary | ICD-10-CM

## 2023-10-04 DIAGNOSIS — R7303 Prediabetes: Secondary | ICD-10-CM | POA: Diagnosis not present

## 2023-10-04 DIAGNOSIS — E876 Hypokalemia: Secondary | ICD-10-CM | POA: Diagnosis not present

## 2023-10-04 DIAGNOSIS — D500 Iron deficiency anemia secondary to blood loss (chronic): Secondary | ICD-10-CM

## 2023-10-04 LAB — CBC
HCT: 30.6 % — ABNORMAL LOW (ref 36.0–46.0)
Hemoglobin: 10.2 g/dL — ABNORMAL LOW (ref 12.0–15.0)
MCHC: 33.5 g/dL (ref 30.0–36.0)
MCV: 91.8 fl (ref 78.0–100.0)
Platelets: 223 10*3/uL (ref 150.0–400.0)
RBC: 3.33 Mil/uL — ABNORMAL LOW (ref 3.87–5.11)
RDW: 14.9 % (ref 11.5–15.5)
WBC: 3.6 10*3/uL — ABNORMAL LOW (ref 4.0–10.5)

## 2023-10-04 LAB — COMPREHENSIVE METABOLIC PANEL WITH GFR
ALT: 35 U/L (ref 0–35)
AST: 44 U/L — ABNORMAL HIGH (ref 0–37)
Albumin: 4.3 g/dL (ref 3.5–5.2)
Alkaline Phosphatase: 64 U/L (ref 39–117)
BUN: 17 mg/dL (ref 6–23)
CO2: 28 meq/L (ref 19–32)
Calcium: 9.9 mg/dL (ref 8.4–10.5)
Chloride: 103 meq/L (ref 96–112)
Creatinine, Ser: 1.16 mg/dL (ref 0.40–1.20)
GFR: 44.85 mL/min — ABNORMAL LOW (ref >60.00)
Glucose, Bld: 95 mg/dL (ref 70–99)
Potassium: 4.5 meq/L (ref 3.5–5.1)
Sodium: 140 meq/L (ref 135–145)
Total Bilirubin: 0.7 mg/dL (ref 0.2–1.2)
Total Protein: 7.5 g/dL (ref 6.0–8.3)

## 2023-10-04 LAB — HEMOGLOBIN A1C: Hgb A1c MFr Bld: 6.3 % (ref 4.6–6.5)

## 2023-10-04 LAB — VITAMIN D 25 HYDROXY (VIT D DEFICIENCY, FRACTURES): VITD: 24.43 ng/mL — ABNORMAL LOW (ref 30.00–100.00)

## 2023-10-04 LAB — TSH: TSH: 30.31 u[IU]/mL — ABNORMAL HIGH (ref 0.35–5.50)

## 2023-10-04 LAB — T4, FREE: Free T4: 0.67 ng/dL (ref 0.60–1.60)

## 2023-10-04 MED ORDER — SEMAGLUTIDE-WEIGHT MANAGEMENT 1.7 MG/0.75ML ~~LOC~~ SOAJ: 1.7000 mg | SUBCUTANEOUS | 0 refills | Status: DC

## 2023-10-04 MED ORDER — ALBUTEROL SULFATE HFA 108 (90 BASE) MCG/ACT IN AERS: 2.0000 | INHALATION_SPRAY | Freq: Four times a day (QID) | RESPIRATORY_TRACT | 4 refills | Status: DC | PRN

## 2023-10-04 MED ORDER — SEMAGLUTIDE-WEIGHT MANAGEMENT 1 MG/0.5ML ~~LOC~~ SOAJ
1.0000 mg | SUBCUTANEOUS | 0 refills | Status: DC
Start: 2023-12-01 — End: 2023-10-04

## 2023-10-04 MED ORDER — SEMAGLUTIDE-WEIGHT MANAGEMENT 0.5 MG/0.5ML ~~LOC~~ SOAJ: 0.5000 mg | SUBCUTANEOUS | 0 refills | Status: AC

## 2023-10-04 MED ORDER — AMITRIPTYLINE HCL 50 MG PO TABS: ORAL_TABLET | ORAL | 1 refills | Status: DC

## 2023-10-04 MED ORDER — SEMAGLUTIDE-WEIGHT MANAGEMENT 0.25 MG/0.5ML ~~LOC~~ SOAJ: 0.2500 mg | SUBCUTANEOUS | 0 refills | Status: AC

## 2023-10-04 MED ORDER — SEMAGLUTIDE-WEIGHT MANAGEMENT 1 MG/0.5ML ~~LOC~~ SOAJ: 1.0000 mg | SUBCUTANEOUS | 0 refills | Status: AC

## 2023-10-04 MED ORDER — SEMAGLUTIDE-WEIGHT MANAGEMENT 0.5 MG/0.5ML ~~LOC~~ SOAJ: 0.5000 mg | SUBCUTANEOUS | 0 refills | Status: DC

## 2023-10-04 MED ORDER — SEMAGLUTIDE-WEIGHT MANAGEMENT 0.25 MG/0.5ML ~~LOC~~ SOAJ: 0.2500 mg | SUBCUTANEOUS | 0 refills | Status: DC

## 2023-10-04 MED ORDER — SEMAGLUTIDE-WEIGHT MANAGEMENT 2.4 MG/0.75ML ~~LOC~~ SOAJ: 2.4000 mg | SUBCUTANEOUS | 0 refills | Status: DC

## 2023-10-04 NOTE — Progress Notes (Signed)
   Subjective:   Patient ID: [name], female    DOB: 08-14-1945, 78 y.o.   MRN: [id number]  HPI The patient is a 78 YO female coming in for follow up medical conditions (see A/P).   Review of Systems  Constitutional: Negative.   HENT: Negative.    Eyes: Negative.   Respiratory:  Negative for cough, chest tightness and shortness of breath.   Cardiovascular:  Negative for chest pain, palpitations and leg swelling.  Gastrointestinal:  Positive for abdominal distention, diarrhea and nausea. Negative for abdominal pain, constipation and vomiting.  Musculoskeletal: Negative.   Skin: Negative.   Neurological: Negative.   Psychiatric/Behavioral: Negative.      Objective:  Physical Exam Constitutional:      Appearance: She is well-developed.  HENT:     Head: Normocephalic and atraumatic.  Cardiovascular:     Rate and Rhythm: Normal rate and regular rhythm.  Pulmonary:     Effort: Pulmonary effort is normal. No respiratory distress.     Breath sounds: Normal breath sounds. No wheezing or rales.  Abdominal:     General: Bowel sounds are normal.     Palpations: Abdomen is soft.     Tenderness: There is no abdominal tenderness. There is no rebound.     Comments: Minimal tenderness with distention  Musculoskeletal:        General: Tenderness present.     Cervical back: Normal range of motion.  Skin:    General: Skin is warm and dry.  Neurological:     Mental Status: She is alert and oriented to person, place, and time.     Coordination: Coordination normal.     Vitals:   10/04/23 0758  BP: 126/80  Pulse: 73  Temp: 97.6 F (36.4 C)  TempSrc: Oral  SpO2: 91%  Weight: 217 lb (98.4 kg)  Height: 5\' 5"  (1.651 m)    Assessment & Plan:

## 2023-10-04 NOTE — Telephone Encounter (Signed)
 Resent in

## 2023-10-04 NOTE — Patient Instructions (Signed)
 We will send in the shots for weight loss to see if this get approved.  We will check the labs today.

## 2023-10-04 NOTE — Telephone Encounter (Signed)
 Patient was seen today and had amitriptyline  (ELAVIL ) 50 MG tablet and Semaglutide -Weight Management 0.25 MG/0.5ML SOAJ sent in. She said they were sent to the wrong pharmacy. She would like them to be sent to [facility]. Best callback is [phone].

## 2023-10-05 NOTE — Assessment & Plan Note (Signed)
 Checking TSH and free T4 and adjust levothyroxine  as needed.

## 2023-10-05 NOTE — Assessment & Plan Note (Signed)
 Will not recheck today as persistently low and likely poor absorption. Has been stable in the past and no clinical signs of worsening.

## 2023-10-05 NOTE — Assessment & Plan Note (Signed)
 Checking CBC and adjust as needed.

## 2023-10-05 NOTE — Assessment & Plan Note (Signed)
 BMi 36 and complicated by pre-diabetes and arthritis. Rx wegovy standard titration she wishes to try this.

## 2023-10-05 NOTE — Assessment & Plan Note (Signed)
 Checking CMP and adjust as needed. She is taking potassium oral. Likely some poor absorption due to short gut.

## 2023-10-05 NOTE — Assessment & Plan Note (Signed)
Checking HgA1c for follow up. Adjust as needed. 

## 2023-10-06 ENCOUNTER — Other Ambulatory Visit (HOSPITAL_COMMUNITY): Payer: Self-pay

## 2023-10-06 ENCOUNTER — Telehealth: Payer: Self-pay | Admitting: Pharmacy Technician

## 2023-10-06 NOTE — Telephone Encounter (Signed)
 Pharmacy Patient Advocate Encounter   Received notification from Onbase that prior authorization for WEGOVY 0.25 MG is required/requested.   Insurance verification completed.   The patient is insured through [facility] .   Per test claim: PA required; PA submitted to above mentioned insurance via Fax Key/confirmation #/EOC [id number] Status is pending

## 2023-10-07 ENCOUNTER — Other Ambulatory Visit (HOSPITAL_COMMUNITY): Payer: Self-pay

## 2023-10-08 ENCOUNTER — Other Ambulatory Visit (HOSPITAL_COMMUNITY): Payer: Self-pay

## 2023-10-08 NOTE — Assessment & Plan Note (Signed)
Checking vitamin D level and adjust as needed.  

## 2023-10-11 ENCOUNTER — Other Ambulatory Visit (HOSPITAL_COMMUNITY): Payer: Self-pay

## 2023-10-12 ENCOUNTER — Other Ambulatory Visit (HOSPITAL_COMMUNITY): Payer: Self-pay

## 2023-10-12 ENCOUNTER — Other Ambulatory Visit: Payer: Self-pay | Admitting: Internal Medicine

## 2023-10-12 DIAGNOSIS — Z1231 Encounter for screening mammogram for malignant neoplasm of breast: Secondary | ICD-10-CM

## 2023-10-12 NOTE — Telephone Encounter (Signed)
 Good afternoon I contacted [employer] to check the status of this PA and the hold time was over an hour so I'm waiting on a return phone call.

## 2023-10-13 ENCOUNTER — Other Ambulatory Visit (HOSPITAL_COMMUNITY): Payer: Self-pay

## 2023-10-13 ENCOUNTER — Ambulatory Visit: Payer: Self-pay

## 2023-10-14 ENCOUNTER — Other Ambulatory Visit (HOSPITAL_COMMUNITY): Payer: Self-pay

## 2023-10-15 ENCOUNTER — Other Ambulatory Visit (HOSPITAL_COMMUNITY): Payer: Self-pay

## 2023-10-20 ENCOUNTER — Other Ambulatory Visit (HOSPITAL_COMMUNITY): Payer: Self-pay

## 2023-10-21 ENCOUNTER — Other Ambulatory Visit (HOSPITAL_COMMUNITY): Payer: Self-pay

## 2023-10-26 ENCOUNTER — Telehealth: Payer: Self-pay

## 2023-10-26 ENCOUNTER — Other Ambulatory Visit (HOSPITAL_COMMUNITY): Payer: Self-pay

## 2023-10-26 NOTE — Telephone Encounter (Signed)
 Pharmacy Patient Advocate Encounter   Received notification from CoverMyMeds that prior authorization for Wegovy  0.25 is required/requested.   Insurance verification completed.   The patient is insured through [facility] .   Per test claim: Plan requires 1) Letter of Medical Necessity and 2) a treatment plan faxed to Account Management [phone] toapprove patient for Wegovy  0.25.  Per Genevia at [facility]

## 2023-10-26 NOTE — Telephone Encounter (Signed)
 Copied from CRM [id number]. Topic: Clinical - Prescription Issue >> Oct 26, 2023  1:19 PM Bridgette Campus T wrote: Reason for CRM: Semaglutide -Weight Management 0.25 MG/0.5ML SOAJ- have not received medication yet

## 2023-10-27 NOTE — Telephone Encounter (Signed)
 There are needing these thins for PA  Plan requires 1) Letter of Medical Necessity and 2) a treatment plan faxed to Account Management [phone] toapprove patient for Wegovy  0.25.  Per Genevia at [facility]

## 2023-10-27 NOTE — Telephone Encounter (Signed)
 Someone had last called on 5/13 and was waiting to see why the PA was on hold do we have an update? If this has been approved or not?

## 2023-10-29 NOTE — Telephone Encounter (Signed)
 Ok for both to be done.

## 2023-11-02 ENCOUNTER — Other Ambulatory Visit: Payer: Self-pay | Admitting: Internal Medicine

## 2023-11-02 MED ORDER — ALBUTEROL SULFATE HFA 108 (90 BASE) MCG/ACT IN AERS: 2.0000 | INHALATION_SPRAY | Freq: Four times a day (QID) | RESPIRATORY_TRACT | 4 refills | Status: DC | PRN

## 2023-11-02 MED ORDER — AMITRIPTYLINE HCL 50 MG PO TABS: ORAL_TABLET | ORAL | 0 refills | Status: AC

## 2023-11-02 NOTE — Telephone Encounter (Signed)
 Copied from CRM [id number]. Topic: Clinical - Medication Refill >> Nov 02, 2023 10:58 AM Juleen Oakland F wrote: Medication:   amitriptyline  (ELAVIL ) 50 MG tablet albuterol  (VENTOLIN  HFA) 108 (90 Base) MCG/ACT inhaler   Has the patient contacted their pharmacy? Yes (Agent: If no, request that the patient contact the pharmacy for the refill. If patient does not wish to contact the pharmacy document the reason why and proceed with request.) (Agent: If yes, when and what did the pharmacy advise?)  This is the patient's preferred pharmacy:   [facility] - [place], [place] - [address] [facility] [address] [place] [address] Phone: [phone] Fax: [phone]   Is this the correct pharmacy for this prescription? Yes If no, delete pharmacy and type the correct one.   Has the prescription been filled recently? Yes  Is the patient out of the medication? Yes  Has the patient been seen for an appointment in the last year OR does the patient have an upcoming appointment? No  Can we respond through MyChart? No  Agent: Please be advised that Rx refills may take up to 3 business days. We ask that you follow-up with your pharmacy.

## 2023-11-04 NOTE — Telephone Encounter (Signed)
 I have fax paper work to [employer] team

## 2023-11-10 ENCOUNTER — Other Ambulatory Visit (HOSPITAL_COMMUNITY): Payer: Self-pay

## 2023-11-10 NOTE — Telephone Encounter (Signed)
 Pharmacy Patient Advocate Encounter   Received notification from Physician's Office that prior authorization for Wegovy  is required/requested.   Insurance verification completed.   The patient is insured through [facility] .   Per test claim: paperwork and A1C faxed to [employer]. Status is pending

## 2023-11-10 NOTE — Progress Notes (Signed)
 Did not save letter but I did print

## 2023-11-17 ENCOUNTER — Telehealth: Payer: Self-pay

## 2023-11-17 ENCOUNTER — Other Ambulatory Visit (HOSPITAL_COMMUNITY): Payer: Self-pay

## 2023-11-17 NOTE — Telephone Encounter (Signed)
 Spoke with Trevor Fudge at the Pharmacy help desk with [employer]. Prior authorization for Wegovy  is still pending. They are currently running a 7-14 business day turn around time. As soon as notification is received we will forward information

## 2023-11-18 ENCOUNTER — Ambulatory Visit
Admission: RE | Admit: 2023-11-18 | Discharge: 2023-11-18 | Disposition: A | Discharge status: Home / Self Care | Source: Ambulatory Visit | Attending: Internal Medicine | Admitting: Internal Medicine

## 2023-11-18 DIAGNOSIS — Z1231 Encounter for screening mammogram for malignant neoplasm of breast: Secondary | ICD-10-CM

## 2023-11-19 NOTE — Progress Notes (Deleted)
 HeaHPI F former smoker ( 20 pk yr, quit 1980) referred for shortness of breath by Dr Acharya/ Cardiology for eval of Dyspnea and wheezing, complicated by HTN, Hiatal Hernia, GERD/ stricture, Gatroparesis, Hx C.diff colitis, Goiter/ Hypothyroid p RAI, DM2, Eczema, Breast Cancer L/ chemo/ XRT, FE def Anemia, Renal Insuficiency,  ECHO- nl w mild AR,  Negative ischemia w/u w non-obstructive CAD on CCTA,  PFT 03/22/20- minimal obstruction possible, FEV1/FVC 0.80, insignificant response to bronchodilator, mild Diffusion deficit Overnight Oximetry 03/07/20- O2 sat was 5% or more below avg for at least 5 cumulative minutes. (7 min 44 sec), Qualifying for sleep O2 Group 1 alternate. ==========================================================   11/20/22- 76 yoF former smoker ( 20 pk yr, quit 1980) referred for shortness of breath by Dr Acharya/ Cardiology for eval of Dyspnea and wheezing, complicated by HTN, Hiatal Hernia, GERD/ stricture, Gastroparesis, Hx C.diff colitis, Goiter/ Hypothyroid p RAI, DM2, Eczema, Breast Cancer L/ chemo/ XRT, FE def Anemia, Renal Insuficiency, Aortic Atherosclerosis/ CAD,  O2 2L - no longer has. - ProAirHFA, Anoro,  Flonase ,      Semaglutide  Previous w/u for dyspnea noted mild obst, mild Aortic Regurg, mild chronic anemia Arrived on room air- 100% sat, HR 82 She asks prescription refill albuterol  and reports continued use of Anoro.  Inhalers do control her wheezing.  She no longer has oxygen  which she turned back in.  Denies cough and denies significant respiratory concerns.  She feels stable CT chest 07/02/22- IMPRESSION: 1. No acute intrathoracic process. 2. No pulmonary nodule or mass. 3. 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 Recommend annual imaging followup by CTA or MRA. This recommendation follows 2010 ACCF/AHA/AATS/ACR/ASA/SCA/SCAI/SIR/STS/SVM Guidelines for the Diagnosis and Management of Patients with Thoracic  Aortic Disease. Circulation. 2010; 121: Z610-R604. Aortic aneurysm NOS (ICD10-I71.9) 4. Coronary artery calcifications.  11/22/23-  36 yoF former smoker ( 20 pk yr, quit 1980) referred for shortness of breath by Dr Acharya/ Cardiology for eval of Dyspnea and wheezing, complicated by HTN, Hiatal Hernia, GERD/ stricture, Gastroparesis, Hx C.diff colitis, Goiter/ Hypothyroid p RAI, DM2, Eczema, Breast Cancer L/ chemo/ XRT, FE def Anemia, Renal Insuficiency, Aortic Atherosclerosis/ CAD,  O2 2L - no longer has. - ProAirHFA, Anoro,  Flonase ,      Semaglutide     ROS-see HPI   + = positive Constitutional:    weight loss, night sweats, fevers, chills, +fatigue, lassitude. HEENT:    headaches, difficulty swallowing, tooth/dental problems, sore throat,       sneezing, itching, ear ache, nasal congestion, post nasal drip, snoring CV:    chest pain, orthopnea, PND, +swelling in lower extremities, anasarca,                                   dizziness, palpitations Resp:  + shortness of breath with exertion or at rest.                productive cough,   non-productive cough, coughing up of blood.              change in color of mucus.  +wheezing.   Skin:    rash or lesions. GI:  + heartburn, indigestion, abdominal pain, nausea, vomiting, diarrhea,                 change in bowel habits, loss of appetite GU: dysuria, change in color of urine, no urgency or frequency.   flank  pain. MS:   joint pain, stiffness, decreased range of motion, back pain. Neuro-     nothing unusual Psych:  change in mood or affect.  depression or anxiety.   memory loss.  OBJ- Physical Exam General- Alert, Oriented, Affect-appropriate, Distress- none acute, + overweight Skin- rash-none, lesions- none, excoriation- none Lymphadenopathy- none Head- atraumatic            Eyes- Gross vision intact, PERRLA, conjunctivae and secretions clear            Ears- Hearing, canals-normal            Nose- Clear, no-Septal dev, mucus,  polyps, erosion, perforation             Throat- Mallampati III-IV , mucosa clear , drainage- none, tonsils- atrophic, + teeth Neck- flexible , trachea midline, no stridor , thyroid  nl, carotid no bruit Chest - symmetrical excursion , unlabored           Heart/CV- RRR , no murmur , no gallop  , no rub, nl s1 s2                           -JVD-none , edema-none, stasis changes- none, varices- none           Lung- clear to P&A, wheeze- none, cough- none , dullness-none, rub- none           Chest wall-  Abd-  Br/ Gen/ Rectal- Not done, not indicated Neuro- grossly intact to observation. +Question mild memory deficit.

## 2023-11-22 ENCOUNTER — Ambulatory Visit: Payer: Medicare Other | Admitting: Internal Medicine

## 2023-11-22 ENCOUNTER — Encounter: Payer: Self-pay | Admitting: Internal Medicine

## 2023-11-24 ENCOUNTER — Other Ambulatory Visit: Payer: Self-pay | Admitting: Internal Medicine

## 2023-11-24 ENCOUNTER — Telehealth: Payer: Self-pay | Admitting: Internal Medicine

## 2023-11-24 DIAGNOSIS — N644 Mastodynia: Secondary | ICD-10-CM

## 2023-11-24 NOTE — Telephone Encounter (Signed)
 Copied from CRM [id number]. Topic: Clinical - Medication Prior Auth >> Nov 24, 2023 10:46 AM Viola F wrote: Reason for CRM: Patient called to follow up on prior authorization for the wegovy . I let her know that the status is pending as of 11/17/23 and turn around time is 7-14 business days. She asked to get a call when status is update.

## 2023-11-24 NOTE — Telephone Encounter (Signed)
 Patient dropped off document Handicap Placard, to be filled out by provider. Patient requested to send it back via Call Patient to pick up within 7-days. Document is located in providers tray at front office.Please advise at [facility] [phone]

## 2023-11-25 ENCOUNTER — Other Ambulatory Visit (HOSPITAL_COMMUNITY): Payer: Self-pay

## 2023-11-29 NOTE — Telephone Encounter (Signed)
 Received and placed inside providers office box

## 2023-12-06 NOTE — Telephone Encounter (Unsigned)
 Copied from CRM [id number]. Topic: Clinical - Medical Advice >> Dec 06, 2023  8:55 AM Laymon HERO wrote: Reason for CRM: patient calling to get status on Handicap Placard form that was brought in on 6/25, please reach out soon

## 2023-12-07 ENCOUNTER — Ambulatory Visit
Admission: RE | Admit: 2023-12-07 | Discharge: 2023-12-07 | Disposition: A | Discharge status: Home / Self Care | Source: Ambulatory Visit | Attending: Internal Medicine | Admitting: Internal Medicine

## 2023-12-07 ENCOUNTER — Ambulatory Visit

## 2023-12-07 DIAGNOSIS — Z853 Personal history of malignant neoplasm of breast: Secondary | ICD-10-CM | POA: Diagnosis not present

## 2023-12-07 DIAGNOSIS — N644 Mastodynia: Secondary | ICD-10-CM | POA: Diagnosis not present

## 2023-12-07 NOTE — Telephone Encounter (Signed)
 Spoke with patient and placard left up front for pick up.

## 2023-12-07 NOTE — Telephone Encounter (Unsigned)
 Copied from CRM [id number]. Topic: General - Other >> Dec 07, 2023  2:26 PM Aisha D wrote: Reason for CRM: patient calling to get status on Handicap Placard form that was brought in on 6/25, please reach out soon.  UPDATE: Pt is calling back to check the status of her Handicap Placard. Pt stated that she is unable to get her license without it. Pt would like a callback by tomorrow regarding this.

## 2023-12-14 ENCOUNTER — Other Ambulatory Visit (HOSPITAL_COMMUNITY): Payer: Self-pay

## 2023-12-15 ENCOUNTER — Other Ambulatory Visit (HOSPITAL_COMMUNITY): Payer: Self-pay

## 2023-12-15 NOTE — Telephone Encounter (Signed)
 Ok I just spent over an hour on the phone with [employer] for them to tell me I need the members permission for any info regarding the PA request that has been in the system since May. Please advise

## 2023-12-17 ENCOUNTER — Telehealth: Payer: Self-pay

## 2023-12-17 NOTE — Telephone Encounter (Signed)
 From is ready and placed up front

## 2023-12-20 ENCOUNTER — Telehealth: Payer: Self-pay

## 2023-12-20 ENCOUNTER — Other Ambulatory Visit (HOSPITAL_COMMUNITY): Payer: Self-pay

## 2023-12-20 ENCOUNTER — Ambulatory Visit (INDEPENDENT_AMBULATORY_CARE_PROVIDER_SITE_OTHER): Payer: Medicare Other

## 2023-12-20 VITALS — BP 124/72 | HR 74 | Ht 65.0 in | Wt 217.6 lb

## 2023-12-20 DIAGNOSIS — Z558 Other problems related to education and literacy: Secondary | ICD-10-CM | POA: Diagnosis not present

## 2023-12-20 DIAGNOSIS — Z0000 Encounter for general adult medical examination without abnormal findings: Secondary | ICD-10-CM | POA: Diagnosis not present

## 2023-12-20 NOTE — Progress Notes (Signed)
 Subjective:  Please attest and cosign this visit due to patients primary care provider not being in the office at the time the visit was completed.  (Pt of Dr Almarie [place])   [name] is a 78 y.o. who presents for a Medicare Wellness preventive visit.  As a reminder, Annual Wellness Visits don't include a physical exam, and some assessments may be limited, especially if this visit is performed virtually. We may recommend an in-person follow-up visit with your provider if needed.  Visit Complete: Virtual I connected with  [name] on 12/20/23 by a audio enabled telemedicine application and verified that I am speaking with the correct person using two identifiers.  Patient Location: Home  Provider Location: Office/Clinic  I discussed the limitations of evaluation and management by telemedicine. The patient expressed understanding and agreed to proceed.  Vital Signs: Because this visit was a virtual/telehealth visit, some criteria may be missing or patient reported. Any vitals not documented were not able to be obtained and vitals that have been documented are patient reported.  Persons Participating in Visit: Patient.  AWV Questionnaire: No: Patient Medicare AWV questionnaire was not completed prior to this visit.  Cardiac Risk Factors include: advanced age (>47men, >40 women);dyslipidemia;hypertension     Objective:    Today's Vitals   12/20/23 1007  BP: 124/72  Pulse: 74  SpO2: 98%  Weight: 217 lb 9.6 oz (98.7 kg)  Height: 5' 5 (1.651 m)   Body mass index is 36.21 kg/m.     12/20/2023   10:07 AM 12/16/2022   10:18 AM 08/21/2022   11:37 AM 08/21/2022   10:58 AM 01/09/2022   10:34 AM 12/10/2021   11:47 AM 05/29/2021   11:38 AM  Advanced Directives  Does Patient Have a Medical Advance Directive? Yes Yes  Yes Yes Yes Yes  Type of Estate agent of [place];Living will Healthcare Power of [place];Living will Healthcare Power  of [place];Living will Healthcare Power of [place];Living will Living will;Healthcare Power of [employer] Power of [place];Living will Healthcare Power of [place];Living will  Does patient want to make changes to medical advance directive?      No - Patient declined No - Patient declined  Copy of Healthcare Power of Attorney in Chart? No - copy requested No - copy requested No - copy requested  No - copy requested No - copy requested Yes - validated most recent copy scanned in chart (See row information)    Current Medications (verified) Outpatient Encounter Medications as of 12/20/2023  Medication Sig   albuterol  (VENTOLIN  HFA) 108 (90 Base) MCG/ACT inhaler Inhale 2 puffs into the lungs every 6 (six) hours as needed for wheezing or shortness of breath.   allopurinol  (ZYLOPRIM ) 100 MG tablet Take 2 tablets (200 mg total) by mouth daily.   amitriptyline  (ELAVIL ) 50 MG tablet TAKE ONE TABLET BY MOUTH EVERY DAY AT BEDTIME (USE CAUTION - MAY CAUSE DROWSINESS)   Calcium  Carbonate-Vitamin D  (CALCIUM -VITAMIN D ) 600-200 MG-UNIT CAPS Take 1 capsule by mouth daily.   cholecalciferol (VITAMIN D ) 25 MCG (1000 UNIT) tablet Take 1,000 Units by mouth daily.   colchicine  0.6 MG tablet Take 1 tablet (0.6 mg total) by mouth daily as needed (gout or psuedogout pain).   Cyanocobalamin  (VITAMIN B 12 PO) Take 1,000 mcg by mouth daily.   famotidine  (PEPCID ) 40 MG tablet Take 1 tablet (40 mg total) by mouth at bedtime.   ferrous sulfate  325 (65 FE) MG tablet Take 1 tablet (325  mg total) by mouth daily with breakfast.   fluticasone  (FLONASE ) 50 MCG/ACT nasal spray Place 2 sprays into both nostrils daily.   furosemide  (LASIX ) 40 MG tablet Take 1 tablet (40 mg total) by mouth daily.   levothyroxine  (SYNTHROID ) 200 MCG tablet Take 1 tablet (200 mcg total) by mouth daily.   lovastatin  (MEVACOR ) 20 MG tablet TAKE ONE TABLET BY MOUTH EVERY DAY WITH FOOD AT 6PM   LYRICA  75 MG capsule TAKE ONE CAPSULE BY MOUTH TWICE  A DAY   magnesium  aspartate (MAGINEX) 615 MG tablet Take 1 tablet (615 mg total) by mouth 2 (two) times daily.   Multiple Vitamins-Minerals (MULTIVITAMIN WOMENS 50+ ADV PO) Take 1 tablet by mouth daily.   nitroGLYCERIN  (NITROSTAT ) 0.4 MG SL tablet Place 1 tablet (0.4 mg total) under the tongue every 5 (five) minutes as needed.   ondansetron  (ZOFRAN ) 4 MG tablet Take 1 tablet (4 mg total) by mouth every 8 (eight) hours as needed for nausea or vomiting.   pantoprazole  (PROTONIX ) 40 MG tablet TAKE 1 TABLET BY MOUTH 30 MINUTES PRIOR TO BREAKFAST AND SUPPER   potassium chloride  SA (KLOR-CON  M) 20 MEQ tablet Take 1 tablet (20 mEq total) by mouth daily.   traMADol  (ULTRAM ) 50 MG tablet Take 1 tablet (50 mg total) by mouth every 12 (twelve) hours as needed for moderate pain (pain score 4-6) or severe pain (pain score 7-10).   umeclidinium-vilanterol (ANORO ELLIPTA ) 62.5-25 MCG/ACT AEPB Inhale 1 puff into the lungs daily.   dicyclomine  (BENTYL ) 20 MG tablet Take 1 tablet (20 mg total) by mouth 3 (three) times daily before meals. (Patient not taking: Reported on 12/20/2023)   levothyroxine  (SYNTHROID ) 25 MCG tablet Take 1 tablet (25 mcg total) by mouth daily. (Patient not taking: Reported on 12/20/2023)   Semaglutide -Weight Management 1 MG/0.5ML SOAJ Inject 1 mg into the skin once a week for 28 days. (Patient not taking: Reported on 12/20/2023)   [START ON 12/30/2023] Semaglutide -Weight Management 1.7 MG/0.75ML SOAJ Inject 1.7 mg into the skin once a week for 28 days. (Patient not taking: Reported on 12/20/2023)   [START ON 01/28/2024] Semaglutide -Weight Management 2.4 MG/0.75ML SOAJ Inject 2.4 mg into the skin once a week for 28 days. (Patient not taking: Reported on 12/20/2023)   No facility-administered encounter medications on file as of 12/20/2023.    Allergies (verified) Aspirin, Trazodone  and nefazodone, Flagyl [metronidazole], and Morphine  and codeine   History: Past Medical History:  Diagnosis Date    Allergy    ANEMIA, IRON DEFICIENCY 05/08/2009   Angina    ASYMPTOMATIC POSTMENOPAUSAL STATUS 10/11/2008   Blood transfusion    Blood transfusion without reported diagnosis    Breast cancer (HCC) 09/29/2011   invasive grade III ductal ca,assoc high grade dcis,ER/PR=neg; left breast   C. difficile colitis    Cataract    Diverticulosis of colon (without mention of hemorrhage)    DVT (deep venous thrombosis) (HCC)    08/21/22 YEARS AGO   Esophageal reflux 06/12/2008   Gastroparesis    GOITER, MULTINODULAR 04/02/2009   Gout, unspecified    H/O hiatal hernia    History of kidney stones    History of lower GI bleeding    History of radiation therapy 02/08/12-03/25/12   left breast,total 61gy   Hypokalemia 05/11/2013   Hypomagnesemia    HYPOTHYROIDISM, POST-RADIATION 08/13/2009   Internal hemorrhoids without mention of complication    Kidney stones    several   Leukopenia    Migraines    Neuropathy  Obesity    On supplemental oxygen  therapy    at night while sleeping - patient did not want to undergo sleep study   Osteoarthrosis, unspecified whether generalized or localized, unspecified site    Other and unspecified hyperlipidemia    Personal history of chemotherapy 2013   Personal history of radiation therapy 2013   left   PONV (postoperative nausea and vomiting)    PUD (peptic ulcer disease)    Short bowel syndrome    Shortness of breath on exertion    sometimes   Stricture and stenosis of esophagus    Thyrotoxicosis without mention of goiter or other cause, without mention of thyrotoxic crisis or storm    Type II or unspecified type diabetes mellitus without mention of complication, not stated as uncontrolled    no med in years diet controled   Unspecified essential hypertension    UTI (urinary tract infection)    Varicose veins    VITAMIN B12 DEFICIENCY 08/30/2009   Past Surgical History:  Procedure Laterality Date   ABDOMINAL ADHESION SURGERY  1980's thru  1990's   several   ABDOMINAL HYSTERECTOMY  1970's   with BSO   BREAST BIOPSY Left 08/13/2011   left breast lower inner quadrant   BREAST BIOPSY Right 1985   Rt exc bx, benign   BREAST LUMPECTOMY Left 08/2011   BREAST LUMPECTOMY W/ NEEDLE LOCALIZATION  09/29/2011   left  breast=lymph node,excision benign/ ER/PR=neg, her 2 Positive   BREAST REDUCTION SURGERY Bilateral 06/09/2021   Procedure: MAMMARY REDUCTION  (BREAST);  Surgeon: Elisabeth Craig RAMAN, MD;  Location: [facility] OR;  Service: Plastics;  Laterality: Bilateral;  2 hours   BUNIONECTOMY  1970's   bilateral   CHOLECYSTECTOMY  1990's   COLON SURGERY     several surgeries for short bowel syndrome   COLONOSCOPY  2012   multiple    DILATION AND CURETTAGE OF UTERUS     ESOPHAGOGASTRODUODENOSCOPY  2011   multiple    EXCISIONAL HEMORRHOIDECTOMY  11/10/2016   EYE SURGERY     long time ago   FLEXIBLE SIGMOIDOSCOPY  2011   multiple    KIDNEY STONE SURGERY  1990's   tried to go up & get it but pushed it further up   LITHOTRIPSY     4 or 5 times   MASS EXCISION Left 08/28/2022   Procedure: EXCISION TISSUE OF LEFT BREAST;  Surgeon: Waddell Leonce NOVAK, MD;  [facility];  Service: Plastics;  Laterality: Left;   MASTECTOMY W/ NODES PARTIAL  09/29/2011   left   PORT-A-CATH REMOVAL Right 12/19/2013   Procedure: MINOR REMOVAL PORT-A-CATH;  Surgeon: Elon CHRISTELLA Pacini, MD;  Location: [facility];  Service: General;  Laterality: Right;   PORTACATH PLACEMENT  09/29/2011   Procedure: INSERTION PORT-A-CATH;  Surgeon: Elon CHRISTELLA Pacini, MD;  Location: [facility] OR;  Service: General;  Laterality: N/A;   REDUCTION MAMMAPLASTY Bilateral    SMALL INTESTINE SURGERY     Thyroid  Ultrasound  12/1994 and 12/1995   TOTAL KNEE ARTHROPLASTY Right 06/05/2015   Procedure: RIGHT TOTAL KNEE ARTHROPLASTY;  Surgeon: Toribio JULIANNA Chancy, MD;  Location: [facility] OR;  Service: Orthopedics;  Laterality: Right;   UPPER GASTROINTESTINAL ENDOSCOPY     VEIN LIGATION AND  STRIPPING  1980's   Right leg   Family History  Problem Relation Age of Onset   Esophageal cancer Son        deceased   Breast cancer Son  esophageal   Diabetes Mother    Heart disease Mother    Kidney disease Sister    Diabetes Father    Hypertension Father    Kidney disease Brother        x 3   Colon cancer Paternal Uncle    Breast cancer Maternal Aunt    Breast cancer Paternal Aunt    Breast cancer Cousin        Pt states she has 15+ cousins w/ Breast CA   Rectal cancer Neg Hx    Stomach cancer Neg Hx    Social History   Socioeconomic History   Marital status: Widowed    Spouse name: Not on file   Number of children: 2   Years of education: 10   Highest education level: 10th grade  Occupational History   Occupation: RETIRED  Tobacco Use   Smoking status: Former    Current packs/day: 0.00    Average packs/day: 1 pack/day for 10.0 years (10.0 ttl pk-yrs)    Types: Cigarettes    Start date: 09/23/1975    Quit date: 09/22/1985    Years since quitting: 38.2   Smokeless tobacco: Never  Vaping Use   Vaping status: Never Used  Substance and Sexual Activity   Alcohol use: Yes    Comment: maybe 2 glasses of wine a month   Drug use: Never   Sexual activity: Not Currently    Birth control/protection: Surgical    Comment: 1st intercourse- 15, partners- 5  Other Topics Concern   Not on file  Social History Narrative   Not on file   Social Drivers of Health   Financial Resource Strain: Low Risk  (12/20/2023)   Overall Financial Resource Strain (CARDIA)    Difficulty of Paying Living Expenses: Not hard at all  Food Insecurity: No Food Insecurity (12/20/2023)   Hunger Vital Sign    Worried About Running Out of Food in the Last Year: Never true    Ran Out of Food in the Last Year: Never true  Transportation Needs: No Transportation Needs (12/20/2023)   PRAPARE - Administrator, Civil Service (Medical): No    Lack of Transportation (Non-Medical): No   Physical Activity: Insufficiently Active (12/20/2023)   Exercise Vital Sign    Days of Exercise per Week: 7 days    Minutes of Exercise per Session: 20 min  Stress: No Stress Concern Present (12/20/2023)   [employer] of Occupational Health - Occupational Stress Questionnaire    Feeling of Stress: Not at all  Social Connections: Socially Integrated (12/20/2023)   Social Connection and Isolation Panel    Frequency of Communication with Friends and Family: More than three times a week    Frequency of Social Gatherings with Friends and Family: More than three times a week    Attends Religious Services: More than 4 times per year    Active Member of [employer] or Organizations: Yes    Attends Engineer, structural: More than 4 times per year    Marital Status: Married    Tobacco Counseling Counseling given: No    Clinical Intake:  Pre-visit preparation completed: Yes  Pain : No/denies pain     BMI - recorded: 36.21 Nutritional Status: BMI > 30  Obese Nutritional Risks: None Diabetes: No  Lab Results  Component Value Date   HGBA1C 6.3 10/04/2023   HGBA1C 5.9 01/05/2023   HGBA1C 5.6 04/28/2022     How often do you need to have  someone help you when you read instructions, pamphlets, or other written materials from your doctor or pharmacy?: 1 - Never  Interpreter Needed?: No  Information entered by :: Verdie Saba, CMA   Activities of Daily Living     12/20/2023   10:15 AM  In your present state of health, do you have any difficulty performing the following activities:  Hearing? 0  Vision? 0  Difficulty concentrating or making decisions? 0  Walking or climbing stairs? 0  Dressing or bathing? 0  Doing errands, shopping? 0  Preparing Food and eating ? N  Using the Toilet? N  In the past six months, have you accidently leaked urine? Y  Comment wears a depend  Do you have problems with loss of bowel control? N  Managing your Medications? N  Managing your  Finances? N  Housekeeping or managing your Housekeeping? N    Patient Care Team: Rollene Almarie LABOR, MD as PCP - General (Internal Medicine) Loni Soyla LABOR, MD as PCP - Cardiology (Cardiology) Jakie Alm SAUNDERS, MD as Attending Physician (Gastroenterology) Fernand Evans, MD (Hematology and Oncology) Gail Favorite, MD as Attending Physician (General Surgery) Claudene Landry RAMAN, RN as Registered Nurse Bucky Kung, MD as Consulting Physician (Hematology and Oncology) Tobie Gordy POUR, MD as Consulting Physician (Nephrology) Octavia Bruckner, MD as Consulting Physician (Ophthalmology) Aneita Gwendlyn DASEN, MD (Inactive) as Consulting Physician (Gastroenterology) [facility] as Consulting Physician (Ophthalmology)  I have updated your Care Teams any recent Medical Services you may have received from other providers in the past year.     Assessment:   This is a routine wellness examination for [name].  Hearing/Vision screen Hearing Screening - Comments:: Denies hearing difficulties   Vision Screening - Comments:: Wears rx glasses - up to date with routine eye exams with Dr Octavia   Goals Addressed               This Visit's Progress     Weight (lb) < 200 lb (90.7 kg) (pt-stated)   217 lb 9.6 oz (98.7 kg)     Patient stated she wants to lose about 50lbs       Depression Screen     12/20/2023   10:17 AM 09/27/2023    8:12 AM 01/05/2023    8:37 AM 12/16/2022   10:39 AM 08/18/2022    9:40 AM 01/12/2022   11:14 AM 01/09/2022   10:25 AM  PHQ 2/9 Scores  PHQ - 2 Score 0 4 3 3 1 4  0  PHQ- 9 Score 1 9 13 13 10 19      Fall Risk     12/20/2023   10:16 AM 10/04/2023    8:11 AM 10/04/2023    7:59 AM 09/27/2023    8:11 AM 04/09/2023    9:00 AM  Fall Risk   Falls in the past year? 1 1 0 1 0  Number falls in past yr: 1 0 0 0 0  Comment 3      Injury with Fall? 1 1 0 1 0  Comment bruises on left arm (no fracture)   Pain in elbow   Risk for fall due to : Impaired  balance/gait      Follow up Falls evaluation completed;Falls prevention discussed Falls evaluation completed Falls evaluation completed Falls evaluation completed Falls evaluation completed    MEDICARE RISK AT HOME:  Medicare Risk at Home Any stairs in or around the home?: No If so, are there any without handrails?: No Home free  of loose throw rugs in walkways, pet beds, electrical cords, etc?: Yes Adequate lighting in your home to reduce risk of falls?: Yes Life alert?: No Use of a cane, walker or w/c?: Yes (cane) Grab bars in the bathroom?: No Shower chair or bench in shower?: No Elevated toilet seat or a handicapped toilet?: No  TIMED UP AND GO:  Was the test performed?  No  Cognitive Function: 6CIT completed        12/20/2023   10:20 AM 12/16/2022   10:46 AM 01/09/2022   10:30 AM  6CIT Screen  What Year? 0 points 0 points 0 points  What month? 0 points 0 points 0 points  What time? 0 points 0 points 0 points  Count back from 20 0 points 0 points 0 points  Months in reverse 0 points 0 points 0 points  Repeat phrase 2 points 0 points 0 points  Total Score 2 points 0 points 0 points    Immunizations Immunization History  Administered Date(s) Administered   Fluad Quad(high Dose 65+) 02/07/2019, 02/12/2020, 02/13/2021   Influenza Split 04/15/2011, 03/23/2012   Influenza Whole 05/15/2010   Influenza, High Dose Seasonal PF 03/31/2016, 03/15/2018   Influenza,inj,Quad PF,6+ Mos 02/23/2013, 02/01/2014, 03/29/2015, 06/28/2017   Influenza-Unspecified 02/13/2020, 03/01/2022, 02/09/2023   PFIZER(Purple Top)SARS-COV-2 Vaccination 06/23/2019, 07/14/2019, 03/03/2020   Pfizer Covid-19 Vaccine Bivalent Booster 49yrs & up 04/01/2021   Pneumococcal Conjugate-13 05/01/2016   Pneumococcal Polysaccharide-23 05/15/2010   Td 01/31/2003   Tdap 06/28/2017   Unspecified SARS-COV-2 Vaccination 02/15/2023   Zoster, Live 06/02/2011    Screening Tests Health Maintenance  Topic Date Due    Zoster Vaccines- Shingrix (1 of 2) 04/06/1963   COVID-19 Vaccine (6 - Pfizer risk 2024-25 season) 08/15/2023   INFLUENZA VACCINE  12/31/2023   OPHTHALMOLOGY EXAM  03/07/2024   Medicare Annual Wellness (AWV)  12/19/2024   DTaP/Tdap/Td (3 - Td or Tdap) 06/29/2027   Pneumococcal Vaccine: 50+ Years  Completed   DEXA SCAN  Completed   Hepatitis C Screening  Completed   Hepatitis B Vaccines  Aged Out   HPV VACCINES  Aged Out   Meningococcal B Vaccine  Aged Out   Colonoscopy  Discontinued    Health Maintenance  Health Maintenance Due  Topic Date Due   Zoster Vaccines- Shingrix (1 of 2) 04/06/1963   COVID-19 Vaccine (6 - Pfizer risk 2024-25 season) 08/15/2023   Health Maintenance Items Addressed:12/20/2023  Referral to [facility] for a safety devices for home.  Needs grab bars, handicap toilet seat, shower chair/bench  Additional Screening:  Vision Screening: Recommended annual ophthalmology exams for early detection of glaucoma and other disorders of the eye. Would you like a referral to an eye doctor? No    Dental Screening: Recommended annual dental exams for proper oral hygiene  Community Resource Referral / Chronic Care Management: CRR required this visit?  Referral to [facility] for a safety devices for home.  Needs grab bars, handicap toilet seat, shower chair/bench  CCM required this visit?  No   Plan:    I have personally reviewed and noted the following in the patient's chart:   Medical and social history Use of alcohol, tobacco or illicit drugs  Current medications and supplements including opioid prescriptions. Patient is currently taking opioid prescriptions. Information provided to patient regarding non-opioid alternatives. Patient advised to discuss non-opioid treatment plan with their provider. Functional ability and status Nutritional status Physical activity Advanced directives List of other physicians Hospitalizations, surgeries, and ER visits in  previous  12 months Vitals Screenings to include cognitive, depression, and falls Referrals and appointments  In addition, I have reviewed and discussed with patient certain preventive protocols, quality metrics, and best practice recommendations. A written personalized care plan for preventive services as well as general preventive health recommendations were provided to patient.   Verdie CHRISTELLA Saba, CMA   12/20/2023   After Visit Summary: (In Person-Printed) AVS printed and given to the patient  Notes: Nothing significant to report at this time.

## 2023-12-20 NOTE — Patient Instructions (Signed)
 [name] , Thank you for taking time out of your busy schedule to complete your Annual Wellness Visit with me. I enjoyed our conversation and look forward to speaking with you again next year. I, as well as your care team,  appreciate your ongoing commitment to your health goals. Please review the following plan we discussed and let me know if I can assist you in the future. Your Game plan/ To Do List    Follow up Visits: Next Medicare AWV with our clinical staff: 12/20/2024   Have you seen your provider in the last 6 months (3 months if uncontrolled diabetes)? No Next Office Visit with your provider: 01/06/2025  Clinician Recommendations:  Aim for 30 minutes of exercise or brisk walking, 6-8 glasses of water, and 5 servings of fruits and vegetables each day. Educated and advised on getting the Shingles vaccines in 2025.        This is a list of the screening recommended for you and due dates:  Health Maintenance  Topic Date Due   Zoster (Shingles) Vaccine (1 of 2) 04/06/1963   COVID-19 Vaccine (6 - Pfizer risk 2024-25 season) 08/15/2023   Flu Shot  12/31/2023   Eye exam for diabetics  03/07/2024   Medicare Annual Wellness Visit  12/19/2024   DTaP/Tdap/Td vaccine (3 - Td or Tdap) 06/29/2027   Pneumococcal Vaccine for age over 38  Completed   DEXA scan (bone density measurement)  Completed   Hepatitis C Screening  Completed   Hepatitis B Vaccine  Aged Out   HPV Vaccine  Aged Out   Meningitis B Vaccine  Aged Out   Colon Cancer Screening  Discontinued    Advanced directives: (Copy Requested) Please bring a copy of your health care power of attorney and living will to the office to be added to your chart at your convenience. You can mail to [facility] [address] [place], [place] [address] or email to [email or web address] Advance Care Planning is important because it:  [x]  Makes sure you receive the medical care that is consistent with your values, goals, and  preferences  [x]  It provides guidance to your family and loved ones and reduces their decisional burden about whether or not they are making the right decisions based on your wishes.  Follow the link provided in your after visit summary or read over the paperwork we have mailed to you to help you started getting your Advance Directives in place. If you need assistance in completing these, please reach out to us  so that we can help you!

## 2023-12-20 NOTE — Telephone Encounter (Signed)
 Pharmacy Patient Advocate Encounter   Received notification from CoverMyMeds that prior authorization for Wegovy  0.25 is required/requested.   Insurance verification completed.   The patient is insured through [facility] .   Per test claim:spoke with [employer] representative. Claim is denied, patient plan does not cover weight loss medications.

## 2023-12-29 ENCOUNTER — Telehealth: Payer: Self-pay | Admitting: *Deleted

## 2023-12-29 ENCOUNTER — Other Ambulatory Visit (HOSPITAL_COMMUNITY): Payer: Self-pay

## 2023-12-29 NOTE — Progress Notes (Signed)
 Complex Care Management Note Care Guide Note  12/29/2023 Name: [name] MRN: [id number] DOB: May 07, 1946   Complex Care Management Outreach Attempts: A second unsuccessful outreach was attempted today to offer the patient with information about available complex care management services.  Follow Up Plan:  Additional outreach attempts will be made to offer the patient complex care management information and services.   Encounter Outcome:  No Answer  [facility] HealthPopulation Health Care Guide  Direct Dial:[phone] Fax:[phone] Website: [email or web address]

## 2023-12-29 NOTE — Progress Notes (Signed)
 Complex Care Management Note Care Guide Note  12/29/2023 Name: SCHERRIE SENECA MRN: [id number] DOB: [birth year]   Complex Care Management Outreach Attempts: An unsuccessful telephone outreach was attempted today to offer the patient information about available complex care management services.  Follow Up Plan:  Additional outreach attempts will be made to offer the patient complex care management information and services.   Encounter Outcome:  No Answer  [facility] HealthPopulation Health Care Guide  Direct [phone] Fax:[phone] Website: [email or web address]

## 2023-12-30 ENCOUNTER — Telehealth: Payer: Self-pay | Admitting: *Deleted

## 2023-12-30 NOTE — Progress Notes (Signed)
 Complex Care Management Note Care Guide Note  12/30/2023 Name: ARCHANA ECKMAN MRN: [id number] DOB: 06-Apr-1946  Othel MALVA Goodell is a 78 y.o. year old female who is a primary care patient of Rollene Almarie LABOR, MD . The community resource team was consulted for assistance with Home Modifications  SDOH screenings and interventions completed:  Yes     SDOH Interventions Today    Flowsheet Row Most Recent Value  SDOH Interventions   Financial Strain Interventions Community Resources Provided  [Patient needing grab bars and other dme provided mailed resources]     Care guide performed the following interventions: Patient provided with information about care guide support team and interviewed to confirm resource needs.  Follow Up Plan:  No further follow up planned at this time. The patient has been provided with needed resources.  Encounter Outcome:  Patient Visit Completed Lekeisha Arenas [facility]  [facility] HealthPopulation Health Care Guide  Direct [phone] Fax:[phone] Website: [email or web address]

## 2024-01-07 ENCOUNTER — Encounter: Payer: Self-pay | Admitting: Internal Medicine

## 2024-01-07 ENCOUNTER — Ambulatory Visit: Admitting: Internal Medicine

## 2024-01-07 VITALS — BP 154/82 | HR 76 | Temp 97.8°F | Ht 65.0 in | Wt 214.0 lb

## 2024-01-07 DIAGNOSIS — I10 Essential (primary) hypertension: Secondary | ICD-10-CM

## 2024-01-07 DIAGNOSIS — R051 Acute cough: Secondary | ICD-10-CM

## 2024-01-07 DIAGNOSIS — J4530 Mild persistent asthma, uncomplicated: Secondary | ICD-10-CM | POA: Diagnosis not present

## 2024-01-07 DIAGNOSIS — Z6835 Body mass index (BMI) 35.0-35.9, adult: Secondary | ICD-10-CM

## 2024-01-07 DIAGNOSIS — R7989 Other specified abnormal findings of blood chemistry: Secondary | ICD-10-CM | POA: Diagnosis not present

## 2024-01-07 DIAGNOSIS — R7303 Prediabetes: Secondary | ICD-10-CM | POA: Diagnosis not present

## 2024-01-07 MED ORDER — TOPIRAMATE 50 MG PO TABS: 50.0000 mg | ORAL_TABLET | Freq: Every day | ORAL | 1 refills | Status: DC

## 2024-01-07 NOTE — Assessment & Plan Note (Signed)
 Declines labs today and counseled about diet and lifestyle changes to help. Follow up 3 months for HGA1c.

## 2024-01-07 NOTE — Assessment & Plan Note (Signed)
 Insurance has denied wegovy . Topiramate  is chosen for weight loss due to its appetite suppression and cost-effectiveness. Prescribe 50 mg tablet daily in the evening.

## 2024-01-07 NOTE — Assessment & Plan Note (Addendum)
 Exertional shortness of breath and wheezing are consistent with asthma. Uses anoro regular and albuterol  as needed. No flare today.

## 2024-01-07 NOTE — Patient Instructions (Signed)
 We have sent in topiramate  to take for the weight loss 1 pill daily.

## 2024-01-07 NOTE — Progress Notes (Signed)
   Subjective:   Patient ID: [name], female    DOB: March 14, 1946, 78 y.o.   MRN: [id number]  Discussed the use of AI scribe software for clinical note transcription with the patient, who gave verbal consent to proceed.  History of Present Illness [name] is a 78 year old female who presents for routine follow up with persistent cough following a viral illness.  She has been experiencing a productive cough with phlegm that began over the weekend. The cough has somewhat improved with over-the-counter cough medicine obtained by her son, but it persists. She notes a significant decrease in energy, stating that she 'just laid down and slept' after returning from church. No new chest pain, tightness, or pressure is reported, but she experiences difficulty breathing and wheezing when walking fast, requiring the use of an inhaler.  She is concerned about her weight and wants to lose weight. She discusses the challenges of finding weight loss treatments covered by her insurance and is interested in exploring medication options that may aid in weight loss. She has not been diagnosed with diabetes but has had blood sugar levels in the prediabetes range in the past.  Review of Systems  Constitutional: Negative.   HENT: Negative.    Eyes: Negative.   Respiratory:  Positive for cough. Negative for chest tightness and shortness of breath.   Cardiovascular:  Negative for chest pain, palpitations and leg swelling.  Gastrointestinal:  Negative for abdominal distention, abdominal pain, constipation, diarrhea, nausea and vomiting.  Musculoskeletal: Negative.   Skin: Negative.   Neurological: Negative.   Psychiatric/Behavioral: Negative.      Objective:  Physical Exam Constitutional:      Appearance: She is well-developed.  HENT:     Head: Normocephalic and atraumatic.  Cardiovascular:     Rate and Rhythm: Normal rate and regular rhythm.  Pulmonary:     Effort: Pulmonary effort is  normal. No respiratory distress.     Breath sounds: Normal breath sounds. No wheezing or rales.  Abdominal:     General: Bowel sounds are normal. There is no distension.     Palpations: Abdomen is soft.     Tenderness: There is no abdominal tenderness. There is no rebound.  Musculoskeletal:     Cervical back: Normal range of motion.  Skin:    General: Skin is warm and dry.  Neurological:     Mental Status: She is alert and oriented to person, place, and time.     Coordination: Coordination normal.     Vitals:   01/07/24 1019 01/07/24 1027  BP: (!) 154/82 (!) 154/82  Pulse: 76   Temp: 97.8 F (36.6 C)   TempSrc: Oral   SpO2: 98%   Weight: 214 lb (97.1 kg)   Height: 5' 5 (1.651 m)     Assessment and Plan Assessment & Plan Morbid Obesity   Topiramate  is chosen for weight loss due to its appetite suppression and cost-effectiveness. Prescribe 50 mg tablet daily in the evening.  Cough   The post-viral cough is expected to resolve in 1-2 weeks. Lungs are clear with no signs of pneumonia or bronchitis. Can continue otc cough medicine.  Asthma   Exertional shortness of breath and wheezing are consistent with asthma. Uses albuterol  as needed.   Prediabetes   Blood sugar levels intermittently fall within the prediabetes range. Check hemoglobin A1c at the next visit.

## 2024-01-07 NOTE — Assessment & Plan Note (Signed)
Stable on most recent labs.

## 2024-01-07 NOTE — Assessment & Plan Note (Signed)
 The post-viral cough is expected to resolve in 1-2 weeks. Lungs are clear with no signs of pneumonia or bronchitis. Can continue otc cough medicine.

## 2024-01-07 NOTE — Assessment & Plan Note (Signed)
 BP mildly elevated and no meds yet today. Continue lasix  and if elevated again adjust regimen.

## 2024-01-24 NOTE — Progress Notes (Signed)
 HeaHPI F former smoker ( 20 pk yr, quit 1980) referred for shortness of breath by Dr Acharya/ Cardiology for eval of Dyspnea and wheezing, complicated by HTN, Hiatal Hernia, GERD/ stricture, Gatroparesis, Hx C.diff colitis, Goiter/ Hypothyroid p RAI, DM2, Eczema, Breast Cancer L/ chemo/ XRT, FE def Anemia, Renal Insuficiency,  ECHO- nl w mild AR,  Negative ischemia w/u w non-obstructive CAD on CCTA,  PFT 03/22/20- minimal obstruction possible, FEV1/FVC 0.80, insignificant response to bronchodilator, mild Diffusion deficit Overnight Oximetry 03/07/20- O2 sat was 5% or more below avg for at least 5 cumulative minutes. (7 min 44 sec), Qualifying for sleep O2 Group 1 alternate. ==========================================================  11/20/22- 76 yoF former smoker ( 20 pk yr, quit 1980) referred for shortness of breath by Dr Acharya/ Cardiology for eval of Dyspnea and wheezing, complicated by HTN, Hiatal Hernia, GERD/ stricture, Gastroparesis, Hx C.diff colitis, Goiter/ Hypothyroid p RAI, DM2, Eczema, Breast Cancer L/ chemo/ XRT, FE def Anemia, Renal Insuficiency, Aortic Atherosclerosis/ CAD,  O2 2L - no longer has. - ProAirHFA, Anoro,  Flonase ,      Semaglutide  Previous w/u for dyspnea noted mild obst, mild Aortic Regurg, mild chronic anemia Arrived on room air- 100% sat, HR 82 She asks prescription refill albuterol  and reports continued use of Anoro.  Inhalers do control her wheezing.  She no longer has oxygen  which she turned back in.  Denies cough and denies significant respiratory concerns.  She feels stable CT chest 07/02/22- IMPRESSION: 1. No acute intrathoracic process. 2. No pulmonary nodule or mass. 3. 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 Recommend annual imaging followup by CTA or MRA. This recommendation follows 2010 ACCF/AHA/AATS/ACR/ASA/SCA/SCAI/SIR/STS/SVM Guidelines for the Diagnosis and Management of Patients with Thoracic Aortic  Disease. Circulation. 2010; 121: Z733-z630. Aortic aneurysm NOS (ICD10-I71.9) 4. Coronary artery calcifications.  01/27/24- 5 yoF former smoker ( 20 pk yr, quit 1980) referred for shortness of breath by Dr Acharya/ Cardiology for eval of Dyspnea and wheezing, complicated by HTN, Hiatal Hernia, GERD/ stricture, Gastroparesis, Hx C.diff colitis, Goiter/ Hypothyroid p RAI, DM2, Eczema, Breast Cancer L/ chemo/ XRT, FE def Anemia, Renal Insuficiency, Aortic Atherosclerosis/ CAD,  O2 2L - no longer has. - ProAirHFA, Anoro,  Flonase ,      Semaglutide  Previous w/u for dyspnea noted mild obst, mild Aortic Regurg, mild chronic anemia Arrived on room air- 97% ACT score 14 Discussed the use of AI scribe software for clinical note transcription with the patient, who gave verbal consent to proceed.  History of Present Illness   [name] is a 78 year old female with asthma who presents for a medication refill and management of acid reflux.  She requires a refill for her albuterol  inhaler, which she uses regularly, and occasionally uses an Anoro inhaler, particularly at night. Her asthma symptoms are sometimes triggered by acid reflux, especially when lying down. She experiences bad acid reflux, which exacerbates her asthma symptoms, particularly at night, and is currently taking an acid blocker for her reflux. She sleeps in an adjustable bed with her head elevated and uses two pillows to help manage her reflux symptoms. I've encouraged her to let her GI doctor present acid management is not sufficient.     Assessment and Plan:    Asthma Asthma symptoms triggered by GERD, especially at night. Lungs clear. Uses albuterol  and Anoro inhalers. - Refilled albuterol  inhaler through [facility] mail order.  Gastroesophageal reflux disease (GERD) Significant nocturnal GERD symptoms trigger asthma. Uses acid blocker and  adjustable bed. Plans GI follow-up. Risk of esophageal scarring and narrowing. -  Continue acid blocker. - Encouraged GI follow-up for further evaluation and management.     ROS-see HPI   + = positive Constitutional:    weight loss, night sweats, fevers, chills, +fatigue, lassitude. HEENT:    headaches, difficulty swallowing, tooth/dental problems, sore throat,       sneezing, itching, ear ache, nasal congestion, post nasal drip, snoring CV:    chest pain, orthopnea, PND, +swelling in lower extremities, anasarca,                                   dizziness, palpitations Resp:  + shortness of breath with exertion or at rest.                productive cough,   non-productive cough, coughing up of blood.              change in color of mucus.  +wheezing.   Skin:    rash or lesions. GI:  + heartburn, indigestion, abdominal pain, nausea, vomiting, diarrhea,                 change in bowel habits, loss of appetite GU: dysuria, change in color of urine, no urgency or frequency.   flank pain. MS:   joint pain, stiffness, decreased range of motion, back pain. Neuro-     nothing unusual Psych:  change in mood or affect.  depression or anxiety.   memory loss.  OBJ- Physical Exam General- Alert, Oriented, Affect-appropriate, Distress- none acute, + overweight Skin- rash-none, lesions- none, excoriation- none Lymphadenopathy- none Head- atraumatic            Eyes- Gross vision intact, PERRLA, conjunctivae and secretions clear            Ears- Hearing, canals-normal            Nose- Clear, no-Septal dev, mucus, polyps, erosion, perforation             Throat- Mallampati III-IV , mucosa clear , drainage- none, tonsils- atrophic, + teeth Neck- flexible , trachea midline, no stridor , thyroid  nl, carotid no bruit Chest - symmetrical excursion , unlabored           Heart/CV- RRR , no murmur , no gallop  , no rub, nl s1 s2                           -JVD-none , edema-none, stasis changes- none, varices- none           Lung- clear to P&A, wheeze- none, cough- none , dullness-none,  rub- none           Chest wall-  Abd-  Br/ Gen/ Rectal- Not done, not indicated Neuro- grossly intact to observation. +Question mild memory deficit.

## 2024-01-26 DIAGNOSIS — H0102A Squamous blepharitis right eye, upper and lower eyelids: Secondary | ICD-10-CM | POA: Diagnosis not present

## 2024-01-26 DIAGNOSIS — H0102B Squamous blepharitis left eye, upper and lower eyelids: Secondary | ICD-10-CM | POA: Diagnosis not present

## 2024-01-26 DIAGNOSIS — H04123 Dry eye syndrome of bilateral lacrimal glands: Secondary | ICD-10-CM | POA: Diagnosis not present

## 2024-01-26 DIAGNOSIS — H40013 Open angle with borderline findings, low risk, bilateral: Secondary | ICD-10-CM | POA: Diagnosis not present

## 2024-01-27 ENCOUNTER — Ambulatory Visit (INDEPENDENT_AMBULATORY_CARE_PROVIDER_SITE_OTHER): Admitting: Internal Medicine

## 2024-01-27 ENCOUNTER — Encounter: Payer: Self-pay | Admitting: Internal Medicine

## 2024-01-27 VITALS — BP 130/80 | HR 87 | Temp 97.8°F | Ht 65.0 in | Wt 211.0 lb

## 2024-01-27 DIAGNOSIS — K219 Gastro-esophageal reflux disease without esophagitis: Secondary | ICD-10-CM | POA: Diagnosis not present

## 2024-01-27 DIAGNOSIS — Z87891 Personal history of nicotine dependence: Secondary | ICD-10-CM

## 2024-01-27 DIAGNOSIS — J45909 Unspecified asthma, uncomplicated: Secondary | ICD-10-CM

## 2024-01-27 DIAGNOSIS — J4530 Mild persistent asthma, uncomplicated: Secondary | ICD-10-CM

## 2024-01-27 MED ORDER — ALBUTEROL SULFATE HFA 108 (90 BASE) MCG/ACT IN AERS: 2.0000 | INHALATION_SPRAY | Freq: Four times a day (QID) | RESPIRATORY_TRACT | 4 refills | Status: AC | PRN

## 2024-01-27 NOTE — Patient Instructions (Addendum)
 Script sent for albuterol  rescue inhaler  I do recommend updated flu and covid vaccines this Fall.  Please call if we can help

## 2024-02-02 ENCOUNTER — Other Ambulatory Visit: Payer: Self-pay

## 2024-02-02 ENCOUNTER — Telehealth: Payer: Self-pay | Admitting: Internal Medicine

## 2024-02-02 DIAGNOSIS — K90829 Short bowel syndrome, unspecified: Secondary | ICD-10-CM

## 2024-02-02 MED ORDER — ONDANSETRON HCL 4 MG PO TABS: 4.0000 mg | ORAL_TABLET | Freq: Three times a day (TID) | ORAL | 0 refills | Status: DC | PRN

## 2024-02-02 MED ORDER — ONDANSETRON HCL 4 MG PO TABS: 4.0000 mg | ORAL_TABLET | Freq: Three times a day (TID) | ORAL | 0 refills | Status: AC | PRN

## 2024-02-02 NOTE — Telephone Encounter (Unsigned)
 Copied from CRM #[id number]. Topic: Clinical - Medication Refill >> Feb 02, 2024 10:32 AM Gennette ORN wrote: Medication: ondansetron  (ZOFRAN ) 4 MG tablet  Has the patient contacted their pharmacy? No (Agent: If no, request that the patient contact the pharmacy for the refill. If patient does not wish to contact the pharmacy document the reason why and proceed with request.) No she rather call us   (Agent: If yes, when and what did the pharmacy advise?)  This is the patient's preferred pharmacy:   [facility] [phone] [address], [place] - [address] [place] [address] Phone: [phone] Fax: [phone]  Is this the correct pharmacy for this prescription? Yes If no, delete pharmacy and type the correct one.   Has the prescription been filled recently? Yes  Is the patient out of the medication? Yes  Has the patient been seen for an appointment in the last year OR does the patient have an upcoming appointment? Yes  Can we respond through MyChart? No  Agent: Please be advised that Rx refills may take up to 3 business days. We ask that you follow-up with your pharmacy.

## 2024-02-02 NOTE — Telephone Encounter (Signed)
 This has been sent in

## 2024-02-09 ENCOUNTER — Telehealth: Payer: Self-pay

## 2024-02-09 MED ORDER — COVID-19 MRNA VACC (MODERNA) 50 MCG/0.5ML IM SUSP: 0.5000 mL | Freq: Once | INTRAMUSCULAR | 0 refills | Status: AC

## 2024-02-09 NOTE — Telephone Encounter (Signed)
 Copied from CRM #[id number]. Topic: Clinical - Medical Advice >> Feb 09, 2024  2:09 PM Thersia BROCKS wrote: Reason for CRM: Patient called in regarding the covid prescription  Store [phone]  [facility] at    [address] [place], [place] [address] [address]   [phone]

## 2024-02-09 NOTE — Telephone Encounter (Signed)
 Sent in please for future do not send to provider until pharmacy is in chart. Thanks

## 2024-03-15 DIAGNOSIS — N1831 Chronic kidney disease, stage 3a: Secondary | ICD-10-CM | POA: Diagnosis not present

## 2024-03-15 DIAGNOSIS — D638 Anemia in other chronic diseases classified elsewhere: Secondary | ICD-10-CM | POA: Diagnosis not present

## 2024-03-15 DIAGNOSIS — I129 Hypertensive chronic kidney disease with stage 1 through stage 4 chronic kidney disease, or unspecified chronic kidney disease: Secondary | ICD-10-CM | POA: Diagnosis not present

## 2024-03-15 DIAGNOSIS — N2581 Secondary hyperparathyroidism of renal origin: Secondary | ICD-10-CM | POA: Diagnosis not present

## 2024-03-29 ENCOUNTER — Ambulatory Visit: Payer: Self-pay | Admitting: Internal Medicine

## 2024-03-29 NOTE — Telephone Encounter (Signed)
 FYI Only or Action Required?: Action required by provider: request for appointment.  Patient was last seen in primary care on 01/07/2024 by Rollene Almarie LABOR, MD.  Called Nurse Triage reporting Breast Pain.  Symptoms began several days ago.  Interventions attempted: OTC medications: Tylenol .  Symptoms are: unchanged.Pain left breast. Had lumpectomy 2013.Concerned about cancer.  Triage Disposition: See PCP Within 2 Weeks  Patient/caregiver understands and will follow disposition?: Yes    Copied from CRM [id number]. Topic: Clinical - Red Word Triage >> Mar 29, 2024  9:44 AM Thersia BROCKS wrote: [facility] that prompted transfer to Nurse Triage: Patient called in stated she has had cancer, is having pain in her breast , called oncology and needs a referral from primary before she can be seen   ----------------------------------------------------------------------- From previous Reason for Contact - Scheduling: Patient/patient representative is calling to schedule an appointment. Refer to attachments for appointment information. Answer Assessment - Initial Assessment Questions 1. SYMPTOM: What's the main symptom you're concerned about?  (e.g., lump, nipple discharge, pain, rash)     Left breast - lumpectomy 2013 2. LOCATION: Where is the  located?     left 3. ONSET: When did   start?     This week 4. PRIOR HISTORY: Do you have any history of prior problems with your breasts? (e.g., breast cancer, breast implant, fibrocystic breast disease)     cancer 5. CAUSE: What do you think is causing this symptom?     unsure 6. OTHER SYMPTOMS: Do you have any other symptoms? (e.g., breast pain, fever, nipple discharge, redness or rash)     no 7. PREGNANCY-BREASTFEEDING: Is there any chance you are pregnant? When was your last menstrual period? Are you breastfeeding?     no  Protocols used: Breast Symptoms-A-AH  Reason for Disposition  [1] Breast pain AND [2] cause is not  known  Answer Assessment - Initial Assessment Questions 1. SYMPTOM: What's the main symptom you're concerned about?  (e.g., lump, nipple discharge, pain, rash)     Left breast - lumpectomy 2013 2. LOCATION: Where is the  located?     left 3. ONSET: When did   start?     This week 4. PRIOR HISTORY: Do you have any history of prior problems with your breasts? (e.g., breast cancer, breast implant, fibrocystic breast disease)     cancer 5. CAUSE: What do you think is causing this symptom?     unsure 6. OTHER SYMPTOMS: Do you have any other symptoms? (e.g., breast pain, fever, nipple discharge, redness or rash)     no 7. PREGNANCY-BREASTFEEDING: Is there any chance you are pregnant? When was your last menstrual period? Are you breastfeeding?     no  Protocols used: Breast Symptoms-A-AH

## 2024-03-30 ENCOUNTER — Encounter: Payer: Self-pay | Admitting: Family Medicine

## 2024-03-30 ENCOUNTER — Ambulatory Visit (INDEPENDENT_AMBULATORY_CARE_PROVIDER_SITE_OTHER): Admitting: Family Medicine

## 2024-03-30 VITALS — BP 122/80 | HR 78 | Temp 97.8°F | Ht 65.0 in | Wt 209.4 lb

## 2024-03-30 DIAGNOSIS — M62838 Other muscle spasm: Secondary | ICD-10-CM | POA: Diagnosis not present

## 2024-03-30 DIAGNOSIS — M79602 Pain in left arm: Secondary | ICD-10-CM | POA: Diagnosis not present

## 2024-03-30 DIAGNOSIS — R0789 Other chest pain: Secondary | ICD-10-CM | POA: Diagnosis not present

## 2024-03-30 DIAGNOSIS — G540 Brachial plexus disorders: Secondary | ICD-10-CM | POA: Diagnosis not present

## 2024-03-30 MED ORDER — TIZANIDINE HCL 4 MG PO TABS: 4.0000 mg | ORAL_TABLET | Freq: Two times a day (BID) | ORAL | 0 refills | Status: AC | PRN

## 2024-03-30 NOTE — Patient Instructions (Signed)
 May use heat or ice to the area for relief as needed.  May use topical rubs or gels to the area as needed for relief.  Attached subtle stretching exercises to help relieve pain and strengthen muscles.   If not improving, we will send you over to physical therapy.  I have sent in a muscle relaxer for you to use one tablet as needed for muscle spasms. This medication can make you sleepy. Do not drive until you know how this medication affects you.

## 2024-03-30 NOTE — Progress Notes (Signed)
 Acute Office Visit  Subjective:     Patient ID: [name], female    DOB: 1946-04-24, 78 y.o.   MRN: [id number]  No chief complaint on file.   HPI  Discussed the use of AI scribe software for clinical note transcription with the patient, who gave verbal consent to proceed.  History of Present Illness [name] is a 78 year old female who presents with left breast pain radiating to the neck and arm.  Left chest wall pain - Left chest wall radiating from behind the neck down the side of the arm and chest - Pain worsens with arm movement - Onset earlier this week - Initially relieved by Tylenol , but pain has since intensified and is no longer responsive to Tylenol  - No recent use of heat or muscle relaxers  Oncologic concerns - Concern about possible breast pathology - Contacted cancer center for evaluation, but was informed a referral is required as it has been over two years since her last visit  Medication-related gastrointestinal symptoms - Current medications include aspirin, morphine , Flagyl, and trazodone  - Some medications cause gastrointestinal upset     ROS Per HPI      Objective:    BP 122/80 (BP Location: Left Arm, Patient Position: Sitting)   Pulse 78   Temp 97.8 F (36.6 C) (Temporal)   Ht 5' 5 (1.651 m)   Wt 209 lb 6.4 oz (95 kg)   SpO2 96%   BMI 34.85 kg/m    Physical Exam Vitals and nursing note reviewed. Exam conducted with a chaperone present Epifania Olden, CMA).  Constitutional:      General: She is not in acute distress.    Appearance: Normal appearance. She is normal weight.  HENT:     Head: Normocephalic and atraumatic.     Right Ear: External ear normal.     Left Ear: External ear normal.     Nose: Nose normal.     Mouth/Throat:     Mouth: Mucous membranes are moist.     Pharynx: Oropharynx is clear.  Eyes:     Extraocular Movements: Extraocular movements intact.     Pupils: Pupils are equal, round, and  reactive to light.  Cardiovascular:     Rate and Rhythm: Normal rate and regular rhythm.     Pulses: Normal pulses.     Heart sounds: Normal heart sounds.  Pulmonary:     Effort: Pulmonary effort is normal. No respiratory distress.     Breath sounds: Normal breath sounds. No wheezing, rhonchi or rales.  Chest:     Chest wall: Swelling and tenderness present. No mass.  Breasts:    Breasts are symmetrical.     Right: Normal. No swelling, bleeding, inverted nipple, mass, nipple discharge, skin change or tenderness.     Left: Skin change (healed surgical scar) present. No swelling, bleeding, inverted nipple, mass, nipple discharge or tenderness.       Comments: Area of mild swelling, chest wall tenderness, muscles and spasm Musculoskeletal:        General: Normal range of motion.       Arms:     Cervical back: Normal range of motion.     Right lower leg: No edema.     Left lower leg: No edema.     Comments: Area of tenderness, muscle spasm, swelling.  Limited range of motion.  No bruising, no erythema, no obvious deformity  Lymphadenopathy:     Cervical: No cervical adenopathy.  Upper Body:     Right upper body: No supraclavicular, axillary or pectoral adenopathy.     Left upper body: No supraclavicular, axillary or pectoral adenopathy.  Neurological:     General: No focal deficit present.     Mental Status: She is alert and oriented to person, place, and time.  Psychiatric:        Mood and Affect: Mood normal.        Thought Content: Thought content normal.    EKG: Reviewed and interpreted by me Indication: chest pain Rate: 70 Interpretation: 1st AV block Changes from previous: none  No results found for any visits on 03/30/24.      Assessment & Plan:   Assessment and Plan Assessment & Plan Left chest wall pain, left arm pain, left neck pain, muscle spasm Most likely thoracic syndrome Acute musculoskeletal pain likely due to muscle strain. EKG normal, no obvious  breast pathology. - Prescribed tizanidine for muscle relaxation, twice daily as needed, caution for drowsiness. - Advised heat application before stretches. - Provided stretching exercises. - Consider physical therapy referral if no improvement in a week.     Orders Placed This Encounter  Procedures   EKG 12-Lead     Meds ordered this encounter  Medications   tiZANidine (ZANAFLEX) 4 MG tablet    Sig: Take 1 tablet (4 mg total) by mouth 2 (two) times daily as needed for muscle spasms.    Dispense:  30 tablet    Refill:  0    Return if symptoms worsen or fail to improve.  Corean LITTIE Ku, FNP

## 2024-04-06 ENCOUNTER — Other Ambulatory Visit: Payer: Self-pay | Admitting: Family Medicine

## 2024-04-06 ENCOUNTER — Other Ambulatory Visit: Payer: Self-pay | Admitting: Internal Medicine

## 2024-04-06 DIAGNOSIS — G540 Brachial plexus disorders: Secondary | ICD-10-CM

## 2024-04-06 DIAGNOSIS — M62838 Other muscle spasm: Secondary | ICD-10-CM

## 2024-04-06 NOTE — Telephone Encounter (Signed)
 Last OV 07/28/22 Next OV not scheduled  Pt has not been seen in fell over 1 year, needs f/u OV. Please reach out to pt to assist with scheduling.   Controlled substance, forwarding to Dr. Joane.

## 2024-05-02 ENCOUNTER — Other Ambulatory Visit: Payer: Self-pay

## 2024-05-02 DIAGNOSIS — K219 Gastro-esophageal reflux disease without esophagitis: Secondary | ICD-10-CM

## 2024-05-18 NOTE — Progress Notes (Unsigned)
 Chief Complaint: Primary GI MD: Dr. Aneita  HPI: 78 year old female history of chronic diarrhea, hypertension, previous breast cancer, gastroparesis, GERD, chronic abdominal pain s/p multiple abdominal surgeries including hysterectomy, BSO, cholecystectomy, multiple prior small bowel obstructions and lysis of adhesions presents for evaluation of nausea/vomiting   Discussed the use of AI scribe software for clinical note transcription with the patient, who gave verbal consent to proceed.  History of Present Illness      PREVIOUS GI WORKUP    EGD 05/2019 - Benign- appearing esophageal stenoses. Dilated.  - Medium- sized hiatal hernia.  - Normal duodenal bulb and second portion of the duodenum. - No specimens collected.  Colonoscopy 05/2019 - Preparation of the colon was fair.  - One 6 mm polyp in the cecum, removed with a cold snare. Resected and retrieved.  - Localized mild mucosal changes were found around the appendiceal orifice secondary to colitis. Biopsied.  - Mild diverticulosis in the right colon.  - Mild diverticulosis in the left colon. - Internal hemorrhoids.  - The examination was otherwise normal on direct and retroflexion views.  Diagnosis 1. Surgical [P], colon, cecum - BENIGN SMALL BOWEL MUCOSA WITH LYMPHOID AGGREGATES. - NO ACTIVE INFLAMMATION. - NO DYSPLASIA OR MALIGNANCY. 2. Surgical [P], colon, cecum, polyp - BENIGN SMALL BOWEL MUCOSA WITH LYMPHOID AGGREGATES. - NO DYSPLASIA OR MALIGNANCY. RECARDO LIKENS MD P  Gastric emptying  scan in 2015 showed 71% retention at 2 hours   Past Medical History:  Diagnosis Date   Allergy    ANEMIA, IRON DEFICIENCY 05/08/2009   Angina    ASYMPTOMATIC POSTMENOPAUSAL STATUS 10/11/2008   Blood transfusion    Blood transfusion without reported diagnosis    Breast cancer (HCC) 09/29/2011   invasive grade III ductal ca,assoc high grade dcis,ER/PR=neg; left breast   C. difficile colitis    Cataract    Diverticulosis of colon  (without mention of hemorrhage)    DVT (deep venous thrombosis) (HCC)    08/21/22 YEARS AGO   Esophageal reflux 06/12/2008   Gastroparesis    GOITER, MULTINODULAR 04/02/2009   Gout, unspecified    H/O hiatal hernia    History of kidney stones    History of lower GI bleeding    History of radiation therapy 02/08/12-03/25/12   left breast,total 61gy   Hypokalemia 05/11/2013   Hypomagnesemia    HYPOTHYROIDISM, POST-RADIATION 08/13/2009   Internal hemorrhoids without mention of complication    Kidney stones    several   Leukopenia    Migraines    Neuropathy    Obesity    On supplemental oxygen  therapy    at night while sleeping - patient did not want to undergo sleep study   Osteoarthrosis, unspecified whether generalized or localized, unspecified site    Other and unspecified hyperlipidemia    Personal history of chemotherapy 2013   Personal history of radiation therapy 2013   left   PONV (postoperative nausea and vomiting)    PUD (peptic ulcer disease)    Short bowel syndrome    Shortness of breath on exertion    sometimes   Stricture and stenosis of esophagus    Thyrotoxicosis without mention of goiter or other cause, without mention of thyrotoxic crisis or storm    Type II or unspecified type diabetes mellitus without mention of complication, not stated as uncontrolled    no med in years diet controled   Unspecified essential hypertension    UTI (urinary tract infection)    Varicose veins  VITAMIN B12 DEFICIENCY 08/30/2009    Past Surgical History:  Procedure Laterality Date   ABDOMINAL ADHESION SURGERY  1980's thru 1990's   several   ABDOMINAL HYSTERECTOMY  1970's   with BSO   BREAST BIOPSY Left 08/13/2011   left breast lower inner quadrant   BREAST BIOPSY Right 1985   Rt exc bx, benign   BREAST LUMPECTOMY Left 08/2011   BREAST LUMPECTOMY W/ NEEDLE LOCALIZATION  09/29/2011   left  breast=lymph node,excision benign/ ER/PR=neg, her 2 Positive   BREAST  REDUCTION SURGERY Bilateral 06/09/2021   Procedure: MAMMARY REDUCTION  (BREAST);  Surgeon: Elisabeth Craig RAMAN, MD;  Location: [facility] OR;  Service: Plastics;  Laterality: Bilateral;  2 hours   BUNIONECTOMY  1970's   bilateral   CHOLECYSTECTOMY  1990's   COLON SURGERY     several surgeries for short bowel syndrome   COLONOSCOPY  2012   multiple    DILATION AND CURETTAGE OF UTERUS     ESOPHAGOGASTRODUODENOSCOPY  2011   multiple    EXCISIONAL HEMORRHOIDECTOMY  11/10/2016   EYE SURGERY     long time ago   FLEXIBLE SIGMOIDOSCOPY  2011   multiple    KIDNEY STONE SURGERY  1990's   tried to go up & get it but pushed it further up   LITHOTRIPSY     4 or 5 times   MASS EXCISION Left 08/28/2022   Procedure: EXCISION TISSUE OF LEFT BREAST;  Surgeon: Waddell Leonce NOVAK, MD;  Location: [facility] OR;  Service: Plastics;  Laterality: Left;   MASTECTOMY W/ NODES PARTIAL  09/29/2011   left   PORT-A-CATH REMOVAL Right 12/19/2013   Procedure: MINOR REMOVAL PORT-A-CATH;  Surgeon: Elon CHRISTELLA Pacini, MD;  Location: [facility];  Service: General;  Laterality: Right;   PORTACATH PLACEMENT  09/29/2011   Procedure: INSERTION PORT-A-CATH;  Surgeon: Elon CHRISTELLA Pacini, MD;  Location: [facility] OR;  Service: General;  Laterality: N/A;   REDUCTION MAMMAPLASTY Bilateral    SMALL INTESTINE SURGERY     Thyroid  Ultrasound  12/1994 and 12/1995   TOTAL KNEE ARTHROPLASTY Right 06/05/2015   Procedure: RIGHT TOTAL KNEE ARTHROPLASTY;  Surgeon: Toribio JULIANNA Chancy, MD;  Location: [facility] OR;  Service: Orthopedics;  Laterality: Right;   UPPER GASTROINTESTINAL ENDOSCOPY     VEIN LIGATION AND STRIPPING  1980's   Right leg    Current Outpatient Medications  Medication Sig Dispense Refill   albuterol  (VENTOLIN  HFA) 108 (90 Base) MCG/ACT inhaler Inhale 2 puffs into the lungs every 6 (six) hours as needed for wheezing or shortness of breath. 54 g 4   allopurinol  (ZYLOPRIM ) 100 MG tablet Take 2 tablets (200 mg total) by mouth daily. 180  tablet 3   amitriptyline  (ELAVIL ) 50 MG tablet TAKE ONE TABLET BY MOUTH EVERY DAY AT BEDTIME (USE CAUTION - MAY CAUSE DROWSINESS) 90 tablet 0   Calcium  Carbonate-Vitamin D  (CALCIUM -VITAMIN D ) 600-200 MG-UNIT CAPS Take 1 capsule by mouth daily.     cholecalciferol (VITAMIN D ) 25 MCG (1000 UNIT) tablet Take 1,000 Units by mouth daily.     colchicine  0.6 MG tablet Take 1 tablet (0.6 mg total) by mouth daily as needed (gout or psuedogout pain). 90 tablet 2   Cyanocobalamin  (VITAMIN B 12 PO) Take 1,000 mcg by mouth daily.     dicyclomine  (BENTYL ) 20 MG tablet Take 1 tablet (20 mg total) by mouth 3 (three) times daily before meals. 270 tablet 3   famotidine  (PEPCID ) 40 MG tablet Take 1 tablet (40 mg  total) by mouth at bedtime. 90 tablet 3   ferrous sulfate  325 (65 FE) MG tablet Take 1 tablet (325 mg total) by mouth daily with breakfast. 90 tablet 3   fluticasone  (FLONASE ) 50 MCG/ACT nasal spray Place 2 sprays into both nostrils daily. 16 g 11   furosemide  (LASIX ) 40 MG tablet Take 1 tablet (40 mg total) by mouth daily. 90 tablet 3   levothyroxine  (SYNTHROID ) 200 MCG tablet Take 1 tablet (200 mcg total) by mouth daily. 90 tablet 3   levothyroxine  (SYNTHROID ) 25 MCG tablet Take 1 tablet (25 mcg total) by mouth daily. 90 tablet 3   lovastatin  (MEVACOR ) 20 MG tablet TAKE ONE TABLET BY MOUTH EVERY DAY WITH FOOD AT 6PM 90 tablet 3   LYRICA  75 MG capsule TAKE ONE CAPSULE BY MOUTH TWICE A DAY 180 capsule 1   magnesium  aspartate (MAGINEX) 615 MG tablet Take 1 tablet (615 mg total) by mouth 2 (two) times daily. 180 tablet 3   Multiple Vitamins-Minerals (MULTIVITAMIN WOMENS 50+ ADV PO) Take 1 tablet by mouth daily.     nitroGLYCERIN  (NITROSTAT ) 0.4 MG SL tablet Place 1 tablet (0.4 mg total) under the tongue every 5 (five) minutes as needed. 25 tablet 1   ondansetron  (ZOFRAN ) 4 MG tablet Take 1 tablet (4 mg total) by mouth every 8 (eight) hours as needed for nausea or vomiting. 20 tablet 0   pantoprazole  (PROTONIX )  40 MG tablet TAKE 1 TABLET BY MOUTH 30 MINUTES PRIOR TO BREAKFAST AND SUPPER 180 tablet 3   potassium chloride  SA (KLOR-CON  M) 20 MEQ tablet Take 1 tablet (20 mEq total) by mouth daily. 90 tablet 3   tiZANidine  (ZANAFLEX ) 4 MG tablet Take 1 tablet (4 mg total) by mouth 2 (two) times daily as needed for muscle spasms. 30 tablet 0   topiramate  (TOPAMAX ) 50 MG tablet TAKE ONE TABLET BY MOUTH EVERY DAY AT BEDTIME 90 tablet 1   traMADol  (ULTRAM ) 50 MG tablet Take 1 tablet (50 mg total) by mouth every 12 (twelve) hours as needed for moderate pain (pain score 4-6) or severe pain (pain score 7-10). 15 tablet 0   umeclidinium-vilanterol (ANORO ELLIPTA ) 62.5-25 MCG/ACT AEPB Inhale 1 puff into the lungs daily. 180 each 4   No current facility-administered medications for this visit.    Allergies as of 05/19/2024 - Review Complete 03/30/2024  Allergen Reaction Noted   Aspirin Other (See Comments) 06/20/2008   Trazodone  and nefazodone Nausea And Vomiting 03/08/2014   Flagyl [metronidazole] Rash 01/16/2014   Morphine  and codeine Itching 09/23/2011    Family History  Problem Relation Age of Onset   Esophageal cancer Son        deceased   Breast cancer Son        esophageal   Diabetes Mother    Heart disease Mother    Kidney disease Sister    Diabetes Father    Hypertension Father    Kidney disease Brother        x 3   Colon cancer Paternal Uncle    Breast cancer Maternal Aunt    Breast cancer Paternal Aunt    Breast cancer Cousin        Pt states she has 15+ cousins w/ Breast CA   Rectal cancer Neg Hx    Stomach cancer Neg Hx     Social History   Socioeconomic History   Marital status: Widowed    Spouse name: Not on file   Number of children: 2  Years of education: 10   Highest education level: 10th grade  Occupational History   Occupation: RETIRED  Tobacco Use   Smoking status: Former    Current packs/day: 0.00    Average packs/day: 1 pack/day for 10.0 years (10.0 ttl pk-yrs)     Types: Cigarettes    Start date: 09/23/1975    Quit date: 09/22/1985    Years since quitting: 38.6   Smokeless tobacco: Never  Vaping Use   Vaping status: Never Used  Substance and Sexual Activity   Alcohol use: Yes    Comment: maybe 2 glasses of wine a month   Drug use: Never   Sexual activity: Not Currently    Birth control/protection: Surgical    Comment: 1st intercourse- 15, partners- 5  Other Topics Concern   Not on file  Social History Narrative   Not on file   Social Drivers of Health   Tobacco Use: Medium Risk (03/30/2024)   Patient History    Smoking Tobacco Use: Former    Smokeless Tobacco Use: Never    Passive Exposure: Not on Actuary Strain: Low Risk (12/20/2023)   Overall Financial Resource Strain (CARDIA)    Difficulty of Paying Living Expenses: Not hard at all  Food Insecurity: No Food Insecurity (12/20/2023)   Epic    Worried About Radiation Protection Practitioner of Food in the Last Year: Never true    Ran Out of Food in the Last Year: Never true  Transportation Needs: No Transportation Needs (12/20/2023)   Epic    Lack of Transportation (Medical): No    Lack of Transportation (Non-Medical): No  Physical Activity: Insufficiently Active (12/20/2023)   Exercise Vital Sign    Days of Exercise per Week: 7 days    Minutes of Exercise per Session: 20 min  Stress: No Stress Concern Present (12/20/2023)   [employer] of Occupational Health - Occupational Stress Questionnaire    Feeling of Stress: Not at all  Social Connections: Socially Integrated (12/20/2023)   Social Connection and Isolation Panel    Frequency of Communication with Friends and Family: More than three times a week    Frequency of Social Gatherings with Friends and Family: More than three times a week    Attends Religious Services: More than 4 times per year    Active Member of Clubs or Organizations: Yes    Attends Banker Meetings: More than 4 times per year    Marital Status:  Married  Catering Manager Violence: Not At Risk (12/20/2023)   Epic    Fear of Current or Ex-Partner: No    Emotionally Abused: No    Physically Abused: No    Sexually Abused: No  Depression (PHQ2-9): Medium Risk (01/07/2024)   Depression (PHQ2-9)    PHQ-2 Score: 10  Alcohol Screen: Low Risk (12/20/2023)   Alcohol Screen    Last Alcohol Screening Score (AUDIT): 0  Housing: Unknown (12/20/2023)   Epic    Unable to Pay for Housing in the Last Year: No    Number of Times Moved in the Last Year: Not on file    Homeless in the Last Year: No  Utilities: Not At Risk (12/20/2023)   Epic    Threatened with loss of utilities: No  Health Literacy: Adequate Health Literacy (12/20/2023)   B1300 Health Literacy    Frequency of need for help with medical instructions: Never    Review of Systems:    Constitutional: No weight loss, fever, chills, weakness or  fatigue HEENT: Eyes: No change in vision               Ears, Nose, Throat:  No change in hearing or congestion Skin: No rash or itching Cardiovascular: No chest pain, chest pressure or palpitations   Respiratory: No SOB or cough Gastrointestinal: See HPI and otherwise negative Genitourinary: No dysuria or change in urinary frequency Neurological: No headache, dizziness or syncope Musculoskeletal: No new muscle or joint pain Hematologic: No bleeding or bruising Psychiatric: No history of depression or anxiety    Physical Exam:  Vital signs: There were no vitals taken for this visit.  Constitutional: NAD, alert and cooperative Head:  Normocephalic and atraumatic. Eyes:   PEERL, EOMI. No icterus. Conjunctiva pink. Respiratory: Respirations even and unlabored. Lungs clear to auscultation bilaterally.   No wheezes, crackles, or rhonchi.  Cardiovascular:  Regular rate and rhythm. No peripheral edema, cyanosis or pallor.  Gastrointestinal:  Soft, nondistended, nontender. No rebound or guarding. Normal bowel sounds. No appreciable masses or  hepatomegaly. Rectal:  Declines Msk:  Symmetrical without gross deformities. Without edema, no deformity or joint abnormality.  Neurologic:  Alert and  oriented x4;  grossly normal neurologically.  Skin:   Dry and intact without significant lesions or rashes. Psychiatric: Oriented to person, place and time. Demonstrates good judgement and reason without abnormal affect or behaviors.  Physical Exam    RELEVANT LABS AND IMAGING: CBC    Component Value Date/Time   WBC 3.6 (L) 10/04/2023 0831   RBC 3.33 (L) 10/04/2023 0831   HGB 10.2 (L) 10/04/2023 0831   HGB 11.2 (L) 08/18/2016 1032   HCT 30.6 (L) 10/04/2023 0831   HCT 33.8 (L) 08/18/2016 1032   PLT 223.0 10/04/2023 0831   PLT 242 08/18/2016 1032   MCV 91.8 10/04/2023 0831   MCV 93.8 08/18/2016 1032   MCH 29.3 05/29/2021 1158   MCHC 33.5 10/04/2023 0831   RDW 14.9 10/04/2023 0831   RDW 13.7 08/18/2016 1032   LYMPHSABS 1.5 01/02/2021 1020   LYMPHSABS 1.4 08/18/2016 1032   MONOABS 0.4 01/02/2021 1020   MONOABS 0.4 08/18/2016 1032   EOSABS 0.1 01/02/2021 1020   EOSABS 0.1 08/18/2016 1032   BASOSABS 0.0 01/02/2021 1020   BASOSABS 0.0 08/18/2016 1032    CMP     Component Value Date/Time   NA 140 10/04/2023 0831   NA 144 08/28/2020 1216   NA 142 08/18/2016 1032   K 4.5 10/04/2023 0831   K 4.6 08/18/2016 1032   CL 103 10/04/2023 0831   CL 107 10/20/2012 1102   CO2 28 10/04/2023 0831   CO2 26 08/18/2016 1032   GLUCOSE 95 10/04/2023 0831   GLUCOSE 86 08/18/2016 1032   GLUCOSE 85 10/20/2012 1102   BUN 17 10/04/2023 0831   BUN 17 08/28/2020 1216   BUN 22.5 08/18/2016 1032   CREATININE 1.16 10/04/2023 0831   CREATININE 1.03 (H) 02/12/2020 1123   CREATININE 1.1 08/18/2016 1032   CALCIUM  9.9 10/04/2023 0831   CALCIUM  9.8 08/18/2016 1032   PROT 7.5 10/04/2023 0831   PROT 7.4 08/18/2016 1032   ALBUMIN  4.3 10/04/2023 0831   ALBUMIN  3.8 08/18/2016 1032   AST 44 (H) 10/04/2023 0831   AST 32 08/18/2016 1032   ALT 35  10/04/2023 0831   ALT 30 08/18/2016 1032   ALKPHOS 64 10/04/2023 0831   ALKPHOS 78 08/18/2016 1032   BILITOT 0.7 10/04/2023 0831   BILITOT 0.66 08/18/2016 1032   GFRNONAA >60 05/29/2021 1158  GFRAA 48 (L) 06/29/2019 1215     Assessment/Plan:   78 year old female history of chronic diarrhea, hypertension, previous breast cancer, gastroparesis, GERD, chronic abdominal pain s/p multiple abdominal surgeries including hysterectomy, BSO, cholecystectomy, multiple prior small bowel obstructions and lysis of adhesions presents for evaluation of nausea/vomiting  Chronic diarrhea Multiple prior small bowel obstructions and lysis of adhesions felt to be component of short-bowel, probable bacterial overgrowth, bile salt enteropathy.  Prior C. difficile infection.  Diverticulosis History of diverticular disease s/p bowel resection  Hemorrhoids S/p hemorrhoidectomy June 2018  Gastroparesis Gastric emptying scan 2015 with 71% retention at 2 hours On metoclopramide  as needed  S/p hysterectomy, BSO, cholecystectomy, multiple exploratory laps for adhesions secondary to obstruction  History of breast cancer   [name] [name] 05/18/2024, 12:08 PM  Cc: Rollene Almarie LABOR, MD

## 2024-05-19 ENCOUNTER — Encounter: Payer: Self-pay | Admitting: Gastroenterology

## 2024-05-19 ENCOUNTER — Other Ambulatory Visit

## 2024-05-19 ENCOUNTER — Ambulatory Visit: Admitting: Gastroenterology

## 2024-05-19 ENCOUNTER — Ambulatory Visit: Payer: Self-pay | Admitting: Gastroenterology

## 2024-05-19 VITALS — BP 122/70 | HR 78 | Ht 65.0 in | Wt 211.0 lb

## 2024-05-19 DIAGNOSIS — R1310 Dysphagia, unspecified: Secondary | ICD-10-CM

## 2024-05-19 DIAGNOSIS — K529 Noninfective gastroenteritis and colitis, unspecified: Secondary | ICD-10-CM | POA: Diagnosis not present

## 2024-05-19 DIAGNOSIS — K920 Hematemesis: Secondary | ICD-10-CM

## 2024-05-19 DIAGNOSIS — R159 Full incontinence of feces: Secondary | ICD-10-CM

## 2024-05-19 DIAGNOSIS — K649 Unspecified hemorrhoids: Secondary | ICD-10-CM

## 2024-05-19 DIAGNOSIS — C50912 Malignant neoplasm of unspecified site of left female breast: Secondary | ICD-10-CM

## 2024-05-19 DIAGNOSIS — Z8719 Personal history of other diseases of the digestive system: Secondary | ICD-10-CM | POA: Diagnosis not present

## 2024-05-19 DIAGNOSIS — K3184 Gastroparesis: Secondary | ICD-10-CM

## 2024-05-19 DIAGNOSIS — Z90710 Acquired absence of both cervix and uterus: Secondary | ICD-10-CM

## 2024-05-19 DIAGNOSIS — K219 Gastro-esophageal reflux disease without esophagitis: Secondary | ICD-10-CM

## 2024-05-19 LAB — COMPREHENSIVE METABOLIC PANEL WITH GFR
ALT: 32 U/L (ref 3–35)
AST: 34 U/L (ref 5–37)
Albumin: 4 g/dL (ref 3.5–5.2)
Alkaline Phosphatase: 64 U/L (ref 39–117)
BUN: 15 mg/dL (ref 6–23)
CO2: 26 meq/L (ref 19–32)
Calcium: 9 mg/dL (ref 8.4–10.5)
Chloride: 106 meq/L (ref 96–112)
Creatinine, Ser: 1.05 mg/dL (ref 0.40–1.20)
GFR: 50.32 mL/min — ABNORMAL LOW
Glucose, Bld: 88 mg/dL (ref 70–99)
Potassium: 3.2 meq/L — ABNORMAL LOW (ref 3.5–5.1)
Sodium: 141 meq/L (ref 135–145)
Total Bilirubin: 0.6 mg/dL (ref 0.2–1.2)
Total Protein: 7.4 g/dL (ref 6.0–8.3)

## 2024-05-19 LAB — CBC WITH DIFFERENTIAL/PLATELET
Basophils Absolute: 0 K/uL (ref 0.0–0.1)
Basophils Relative: 0.5 % (ref 0.0–3.0)
Eosinophils Absolute: 0.1 K/uL (ref 0.0–0.7)
Eosinophils Relative: 1.9 % (ref 0.0–5.0)
HCT: 30 % — ABNORMAL LOW (ref 36.0–46.0)
Hemoglobin: 10.1 g/dL — ABNORMAL LOW (ref 12.0–15.0)
Lymphocytes Relative: 45.4 % (ref 12.0–46.0)
Lymphs Abs: 1.8 K/uL (ref 0.7–4.0)
MCHC: 33.7 g/dL (ref 30.0–36.0)
MCV: 89.6 fl (ref 78.0–100.0)
Monocytes Absolute: 0.3 K/uL (ref 0.1–1.0)
Monocytes Relative: 7.6 % (ref 3.0–12.0)
Neutro Abs: 1.8 K/uL (ref 1.4–7.7)
Neutrophils Relative %: 44.6 % (ref 43.0–77.0)
Platelets: 254 K/uL (ref 150.0–400.0)
RBC: 3.35 Mil/uL — ABNORMAL LOW (ref 3.87–5.11)
RDW: 14.2 % (ref 11.5–15.5)
WBC: 4 K/uL (ref 4.0–10.5)

## 2024-05-19 MED ORDER — PANTOPRAZOLE SODIUM 40 MG PO TBEC: DELAYED_RELEASE_TABLET | ORAL | 3 refills | Status: DC

## 2024-05-19 MED ORDER — COLESTIPOL HCL 1 G PO TABS: 1.0000 g | ORAL_TABLET | Freq: Every day | ORAL | 1 refills | Status: AC

## 2024-05-19 MED ORDER — PANTOPRAZOLE SODIUM 40 MG PO TBEC: DELAYED_RELEASE_TABLET | ORAL | 3 refills | Status: AC

## 2024-05-19 MED ORDER — COLESTIPOL HCL 1 G PO TABS: 1.0000 g | ORAL_TABLET | Freq: Every day | ORAL | 1 refills | Status: DC

## 2024-05-19 NOTE — Patient Instructions (Addendum)
 Your provider has requested that you go to the basement level for lab work before leaving today. Press B on the elevator. The lab is located at the first door on the left as you exit the elevator.  Due to recent changes in healthcare laws, you may see the results of your imaging and laboratory studies on MyChart before your provider has had a chance to review them.  We understand that in some cases there may be results that are confusing or concerning to you. Not all laboratory results come back in the same time frame and the provider may be waiting for multiple results in order to interpret others.  Please give us  48 hours in order for your provider to thoroughly review all the results before contacting the office for clarification of your results.    We have sent the following medications to your pharmacy for you to pick up at your convenience: Colestipol  1 G daily.  Protonix  40 mg twice a day.  You have been scheduled for an endoscopy. Please follow written instructions given to you at your visit today.  If you use inhalers (even only as needed), please bring them with you on the day of your procedure.  If you take any of the following medications, they will need to be adjusted prior to your procedure:   DO NOT TAKE 7 DAYS PRIOR TO TEST- Trulicity  (dulaglutide ) Ozempic , Wegovy  (semaglutide ) Mounjaro, Zepbound  (tirzepatide ) Bydureon Bcise (exanatide extended release)  DO NOT TAKE 1 DAY PRIOR TO YOUR TEST Rybelsus  (semaglutide ) Adlyxin (lixisenatide) Victoza (liraglutide) Byetta (exanatide) ___________________________________________________________________________   Thank you for trusting me with your gastrointestinal care!   Nestor Blower, PA  _______________________________________________________  If your blood pressure at your visit was 140/90 or greater, please contact your primary care physician to follow up on  this.  _______________________________________________________  If you are age 80 or older, your body mass index should be between 23-30. Your Body mass index is 35.11 kg/m. If this is out of the aforementioned range listed, please consider follow up with your Primary Care Provider.  If you are age 25 or younger, your body mass index should be between 19-25. Your Body mass index is 35.11 kg/m. If this is out of the aformentioned range listed, please consider follow up with your Primary Care Provider.   ________________________________________________________  The [facility] providers would like to encourage you to use MYCHART to communicate with providers for non-urgent requests or questions.  Due to long hold times on the telephone, sending your provider a message by [facility] may be a faster and more efficient way to get a response.  Please allow 48 business hours for a response.  Please remember that this is for non-urgent requests.  _______________________________________________________  [facility] is using a team-based approach to care.  Your team is made up of your doctor and two to three APPS. Our APPS (Nurse Practitioners and Physician Assistants) work with your physician to ensure care continuity for you. They are fully qualified to address your health concerns and develop a treatment plan. They communicate directly with your gastroenterologist to care for you. Seeing the Advanced Practice Practitioners on your physician's team can help you by facilitating care more promptly, often allowing for earlier appointments, access to diagnostic testing, procedures, and other specialty referrals.

## 2024-05-22 ENCOUNTER — Telehealth: Payer: Self-pay | Admitting: Internal Medicine

## 2024-05-22 NOTE — Telephone Encounter (Signed)
 Patient left form to be filled out by Dr. Rollene - form placed up front in Dr. Rogena box.

## 2024-05-23 ENCOUNTER — Telehealth: Payer: Self-pay | Admitting: Internal Medicine

## 2024-05-23 NOTE — Telephone Encounter (Signed)
 Form placed in Dr. Rogena box for patient - please call when ready

## 2024-05-23 NOTE — Progress Notes (Signed)
 Agree with the assessment and plan as outlined by Boone Master, PA-C.

## 2024-06-07 NOTE — Telephone Encounter (Signed)
 See previous note

## 2024-06-07 NOTE — Telephone Encounter (Signed)
 Have made several attempts to reach pt concerning form. It needs to be filled out by the patient. There is not an area for the provider. Last contacted on 06/06/24 with no answer. LVM to call office. As of yet have not received a return call.

## 2024-06-22 ENCOUNTER — Ambulatory Visit: Admitting: Gastroenterology

## 2024-06-22 ENCOUNTER — Encounter: Payer: Self-pay | Admitting: Gastroenterology

## 2024-06-22 VITALS — BP 151/76 | HR 70 | Temp 98.4°F | Resp 16 | Ht 65.0 in | Wt 211.0 lb

## 2024-06-22 DIAGNOSIS — K449 Diaphragmatic hernia without obstruction or gangrene: Secondary | ICD-10-CM

## 2024-06-22 DIAGNOSIS — Z8719 Personal history of other diseases of the digestive system: Secondary | ICD-10-CM

## 2024-06-22 DIAGNOSIS — K219 Gastro-esophageal reflux disease without esophagitis: Secondary | ICD-10-CM

## 2024-06-22 DIAGNOSIS — R1310 Dysphagia, unspecified: Secondary | ICD-10-CM

## 2024-06-22 DIAGNOSIS — K222 Esophageal obstruction: Secondary | ICD-10-CM | POA: Diagnosis not present

## 2024-06-22 DIAGNOSIS — K920 Hematemesis: Secondary | ICD-10-CM

## 2024-06-22 MED ORDER — SODIUM CHLORIDE 0.9 % IV SOLN: 500.0000 mL | Freq: Once | INTRAVENOUS | Status: DC

## 2024-06-22 NOTE — Patient Instructions (Signed)

## 2024-06-22 NOTE — Op Note (Signed)
 [facility] Patient Name: [name] Procedure Date: 06/22/2024 3:31 PM MRN: [id number] Endoscopist: Glendia E. Stacia , MD, [id number] Age: 79 Referring MD:  Date of Birth: 1946/02/14 Gender: Female Account #: [id number] Procedure:                Upper GI endoscopy Indications:              Dysphagia Medicines:                Monitored Anesthesia Care Procedure:                Pre-Anesthesia Assessment:                           - Prior to the procedure, a History and Physical                            was performed, and patient medications and                            allergies were reviewed. The patient's tolerance of                            previous anesthesia was also reviewed. The risks                            and benefits of the procedure and the sedation                            options and risks were discussed with the patient.                            All questions were answered, and informed consent                            was obtained. Prior Anticoagulants: The patient has                            taken no anticoagulant or antiplatelet agents. ASA                            Grade Assessment: III - A patient with severe                            systemic disease. After reviewing the risks and                            benefits, the patient was deemed in satisfactory                            condition to undergo the procedure.                           After obtaining informed consent, the endoscope was  passed under direct vision. Throughout the                            procedure, the patient's blood pressure, pulse, and                            oxygen  saturations were monitored continuously. The                            GIF [id number] #[id number] was introduced through the                            mouth, and advanced to the second part of duodenum.                            The upper GI endoscopy was  accomplished without                            difficulty. The patient tolerated the procedure                            well. Scope In: Scope Out: Findings:                 Two benign-appearing, intrinsic mild stenoses were                            found 30-32 cm from the incisors. The narrowest                            stenosis measured 1.3 cm (inner diameter) x less                            than one cm (in length). The stenoses were                            traversed. A guidewire was placed and the scope was                            withdrawn. Dilation was performed with a Savary                            dilator with no resistance at 15 mm and then to 16                            mm. The dilation site was examined following                            endoscope reinsertion and showed mild improvement                            in luminal narrowing. Biopsies were taken with a  cold forceps for histology. Estimated blood loss                            was minimal.                           The exam of the esophagus was otherwise normal.                           A 5 cm hiatal hernia was present.                           The entire examined stomach was normal.                           The examined duodenum was normal. Complications:            No immediate complications. Estimated Blood Loss:     Estimated blood loss was minimal. Impression:               - Benign-appearing esophageal stenoses. Dilated.                            Biopsied.                           - 5 cm hiatal hernia.                           - Normal stomach.                           - Normal examined duodenum. Recommendation:           - Patient has a contact number available for                            emergencies. The signs and symptoms of potential                            delayed complications were discussed with the                            patient. Return to  normal activities tomorrow.                            Written discharge instructions were provided to the                            patient.                           - Resume previous diet.                           - Continue present medications.                           -  Await pathology results.                           - Repeat upper endoscopy as needed for repeat                            dilation. Rashidah Belle[place] E. Stacia, MD 06/22/2024 4:16:49 PM This report has been signed electronically.

## 2024-06-22 NOTE — Progress Notes (Unsigned)
 [facility] History and Physical   Primary Care Physician:  Rollene Almarie LABOR, MD   Reason for Procedure:   Dysphgia  Plan:    EGD  The patient was provided an opportunity to ask questions and all were answered. The patient agreed with the plan    HPI: [name] is a 79 y.o. female undergoing EGD to evaluate/treat esophageal dysphagia, LUQ pain with coffee ground emesis and nausea.  She has a known hiatal hernia and history of esophageal stricture dilated in 2020 with 13-16 mm Savary dilators.   Past Medical History:  Diagnosis Date   Allergy    ANEMIA, IRON DEFICIENCY 05/08/2009   Angina    ASYMPTOMATIC POSTMENOPAUSAL STATUS 10/11/2008   Blood transfusion    Blood transfusion without reported diagnosis    Breast cancer (HCC) 09/29/2011   invasive grade III ductal ca,assoc high grade dcis,ER/PR=neg; left breast   C. difficile colitis    Cataract    Diverticulosis of colon (without mention of hemorrhage)    DVT (deep venous thrombosis) (HCC)    08/21/22 YEARS AGO   Esophageal reflux 06/12/2008   Gastroparesis    GOITER, MULTINODULAR 04/02/2009   Gout, unspecified    H/O hiatal hernia    History of kidney stones    History of lower GI bleeding    History of radiation therapy 02/08/12-03/25/12   left breast,total 61gy   Hypokalemia 05/11/2013   Hypomagnesemia    HYPOTHYROIDISM, POST-RADIATION 08/13/2009   Internal hemorrhoids without mention of complication    Kidney stones    several   Leukopenia    Migraines    Neuropathy    Obesity    On supplemental oxygen  therapy    at night while sleeping - patient did not want to undergo sleep study   Osteoarthrosis, unspecified whether generalized or localized, unspecified site    Other and unspecified hyperlipidemia    Personal history of chemotherapy 2013   Personal history of radiation therapy 2013   left   PONV (postoperative nausea and vomiting)    PUD (peptic ulcer disease)    Short  bowel syndrome    Shortness of breath on exertion    sometimes   Stricture and stenosis of esophagus    Thyrotoxicosis without mention of goiter or other cause, without mention of thyrotoxic crisis or storm    Type II or unspecified type diabetes mellitus without mention of complication, not stated as uncontrolled    no med in years diet controled   Unspecified essential hypertension    UTI (urinary tract infection)    Varicose veins    VITAMIN B12 DEFICIENCY 08/30/2009    Past Surgical History:  Procedure Laterality Date   ABDOMINAL ADHESION SURGERY  1980's thru 1990's   several   ABDOMINAL HYSTERECTOMY  1970's   with BSO   BREAST BIOPSY Left 08/13/2011   left breast lower inner quadrant   BREAST BIOPSY Right 1985   Rt exc bx, benign   BREAST LUMPECTOMY Left 08/2011   BREAST LUMPECTOMY W/ NEEDLE LOCALIZATION  09/29/2011   left  breast=lymph node,excision benign/ ER/PR=neg, her 2 Positive   BREAST REDUCTION SURGERY Bilateral 06/09/2021   Procedure: MAMMARY REDUCTION  (BREAST);  Surgeon: Elisabeth Craig RAMAN, MD;  Location: [facility] OR;  Service: Plastics;  Laterality: Bilateral;  2 hours   BUNIONECTOMY  1970's   bilateral   CHOLECYSTECTOMY  1990's   COLON SURGERY     several surgeries for short bowel syndrome   COLONOSCOPY  2012  multiple    DILATION AND CURETTAGE OF UTERUS     ESOPHAGOGASTRODUODENOSCOPY  2011   multiple    EXCISIONAL HEMORRHOIDECTOMY  11/10/2016   EYE SURGERY     long time ago   FLEXIBLE SIGMOIDOSCOPY  2011   multiple    KIDNEY STONE SURGERY  1990's   tried to go up & get it but pushed it further up   LITHOTRIPSY     4 or 5 times   MASS EXCISION Left 08/28/2022   Procedure: EXCISION TISSUE OF LEFT BREAST;  Surgeon: Waddell Leonce NOVAK, MD;  Location: [facility] OR;  Service: Plastics;  Laterality: Left;   MASTECTOMY W/ NODES PARTIAL  09/29/2011   left   PORT-A-CATH REMOVAL Right 12/19/2013   Procedure: MINOR REMOVAL PORT-A-CATH;  Surgeon: Elon CHRISTELLA Pacini, MD;  Location: [facility];  Service: General;  Laterality: Right;   PORTACATH PLACEMENT  09/29/2011   Procedure: INSERTION PORT-A-CATH;  Surgeon: Elon CHRISTELLA Pacini, MD;  Location: [facility] OR;  Service: General;  Laterality: N/A;   REDUCTION MAMMAPLASTY Bilateral    SMALL INTESTINE SURGERY     Thyroid  Ultrasound  12/1994 and 12/1995   TOTAL KNEE ARTHROPLASTY Right 06/05/2015   Procedure: RIGHT TOTAL KNEE ARTHROPLASTY;  Surgeon: Toribio JULIANNA Chancy, MD;  Location: [facility] OR;  Service: Orthopedics;  Laterality: Right;   UPPER GASTROINTESTINAL ENDOSCOPY     VEIN LIGATION AND STRIPPING  1980's   Right leg    Prior to Admission medications  Medication Sig Start Date End Date Taking? Authorizing Provider  albuterol  (VENTOLIN  HFA) 108 (90 Base) MCG/ACT inhaler Inhale 2 puffs into the lungs every 6 (six) hours as needed for wheezing or shortness of breath. 01/27/24  Yes Young, Clinton D, MD  allopurinol  (ZYLOPRIM ) 100 MG tablet Take 2 tablets (200 mg total) by mouth daily. 06/08/23  Yes Rollene Almarie LABOR, MD  amitriptyline  (ELAVIL ) 50 MG tablet TAKE ONE TABLET BY MOUTH EVERY DAY AT BEDTIME (USE CAUTION - MAY CAUSE DROWSINESS) 11/02/23  Yes Rollene Almarie LABOR, MD  Calcium  Carbonate-Vitamin D  (CALCIUM -VITAMIN D ) 600-200 MG-UNIT CAPS Take 1 capsule by mouth daily.   Yes [provider]  cholecalciferol (VITAMIN D ) 25 MCG (1000 UNIT) tablet Take 1,000 Units by mouth daily.   Yes [provider]  colestipol  (COLESTID ) 1 g tablet Take 1 tablet (1 g total) by mouth daily. 05/19/24  Yes McMichael, Bayley M, PA-C  Cyanocobalamin  (VITAMIN B 12 PO) Take 1,000 mcg by mouth daily.   Yes [provider]  famotidine  (PEPCID ) 40 MG tablet Take 1 tablet (40 mg total) by mouth at bedtime. 03/03/23  Yes Aneita Gwendlyn DASEN, MD  ferrous sulfate  325 (65 FE) MG tablet Take 1 tablet (325 mg total) by mouth daily with breakfast. 01/05/23  Yes Rollene Almarie LABOR, MD  fluticasone  (FLONASE ) 50  MCG/ACT nasal spray Place 2 sprays into both nostrils daily. 01/05/23  Yes Rollene Almarie LABOR, MD  furosemide  (LASIX ) 40 MG tablet Take 1 tablet (40 mg total) by mouth daily. 01/05/23  Yes Rollene Almarie LABOR, MD  levothyroxine  (SYNTHROID ) 200 MCG tablet Take 1 tablet (200 mcg total) by mouth daily. 04/13/23  Yes Rollene Almarie LABOR, MD  levothyroxine  (SYNTHROID ) 25 MCG tablet Take 1 tablet (25 mcg total) by mouth daily. 04/13/23  Yes Rollene Almarie LABOR, MD  lovastatin  (MEVACOR ) 20 MG tablet TAKE ONE TABLET BY MOUTH EVERY DAY WITH FOOD AT 6PM 09/27/23  Yes Rollene Almarie LABOR, MD  LYRICA  75 MG capsule TAKE ONE CAPSULE  BY MOUTH TWICE A DAY 04/07/24  Yes Joane Artist RAMAN, MD  magnesium  aspartate (MAGINEX) 615 MG tablet Take 1 tablet (615 mg total) by mouth 2 (two) times daily. 04/13/23  Yes Rollene Almarie LABOR, MD  Multiple Vitamins-Minerals (MULTIVITAMIN WOMENS 50+ ADV PO) Take 1 tablet by mouth daily.   Yes [provider]  ondansetron  (ZOFRAN ) 4 MG tablet Take 1 tablet (4 mg total) by mouth every 8 (eight) hours as needed for nausea or vomiting. 02/02/24  Yes Rollene Almarie LABOR, MD  pantoprazole  (PROTONIX ) 40 MG tablet TAKE 1 TABLET BY MOUTH 30 MINUTES PRIOR TO BREAKFAST AND SUPPER 05/19/24  Yes McMichael, Bayley M, PA-C  potassium chloride  SA (KLOR-CON  M) 20 MEQ tablet Take 1 tablet (20 mEq total) by mouth daily. 01/05/23  Yes Rollene Almarie LABOR, MD  tiZANidine  (ZANAFLEX ) 4 MG tablet Take 1 tablet (4 mg total) by mouth 2 (two) times daily as needed for muscle spasms. 03/30/24  Yes Alvia Corean CROME, FNP  topiramate  (TOPAMAX ) 50 MG tablet TAKE ONE TABLET BY MOUTH EVERY DAY AT BEDTIME 04/07/24  Yes Rollene Almarie LABOR, MD  umeclidinium-vilanterol (ANORO ELLIPTA ) 62.5-25 MCG/ACT AEPB Inhale 1 puff into the lungs daily. 07/21/22  Yes Young, Reggy D, MD  colchicine  0.6 MG tablet Take 1 tablet (0.6 mg total) by mouth daily as needed (gout or psuedogout pain). 10/01/22   Joane Artist RAMAN,  MD  dicyclomine  (BENTYL ) 20 MG tablet Take 1 tablet (20 mg total) by mouth 3 (three) times daily before meals. 03/03/23 05/19/24  Aneita Gwendlyn DASEN, MD  nitroGLYCERIN  (NITROSTAT ) 0.4 MG SL tablet Place 1 tablet (0.4 mg total) under the tongue every 5 (five) minutes as needed. 01/25/23   Emelia Josefa HERO, NP  traMADol  (ULTRAM ) 50 MG tablet Take 1 tablet (50 mg total) by mouth every 12 (twelve) hours as needed for moderate pain (pain score 4-6) or severe pain (pain score 7-10). 09/27/23   Merlynn Niki FALCON, FNP    Current Outpatient Medications  Medication Sig Dispense Refill   albuterol  (VENTOLIN  HFA) 108 (90 Base) MCG/ACT inhaler Inhale 2 puffs into the lungs every 6 (six) hours as needed for wheezing or shortness of breath. 54 g 4   allopurinol  (ZYLOPRIM ) 100 MG tablet Take 2 tablets (200 mg total) by mouth daily. 180 tablet 3   amitriptyline  (ELAVIL ) 50 MG tablet TAKE ONE TABLET BY MOUTH EVERY DAY AT BEDTIME (USE CAUTION - MAY CAUSE DROWSINESS) 90 tablet 0   Calcium  Carbonate-Vitamin D  (CALCIUM -VITAMIN D ) 600-200 MG-UNIT CAPS Take 1 capsule by mouth daily.     cholecalciferol (VITAMIN D ) 25 MCG (1000 UNIT) tablet Take 1,000 Units by mouth daily.     colestipol  (COLESTID ) 1 g tablet Take 1 tablet (1 g total) by mouth daily. 30 tablet 1   Cyanocobalamin  (VITAMIN B 12 PO) Take 1,000 mcg by mouth daily.     famotidine  (PEPCID ) 40 MG tablet Take 1 tablet (40 mg total) by mouth at bedtime. 90 tablet 3   ferrous sulfate  325 (65 FE) MG tablet Take 1 tablet (325 mg total) by mouth daily with breakfast. 90 tablet 3   fluticasone  (FLONASE ) 50 MCG/ACT nasal spray Place 2 sprays into both nostrils daily. 16 g 11   furosemide  (LASIX ) 40 MG tablet Take 1 tablet (40 mg total) by mouth daily. 90 tablet 3   levothyroxine  (SYNTHROID ) 200 MCG tablet Take 1 tablet (200 mcg total) by mouth daily. 90 tablet 3   levothyroxine  (SYNTHROID ) 25 MCG tablet Take 1 tablet (  25 mcg total) by mouth daily. 90 tablet 3   lovastatin   (MEVACOR ) 20 MG tablet TAKE ONE TABLET BY MOUTH EVERY DAY WITH FOOD AT 6PM 90 tablet 3   LYRICA  75 MG capsule TAKE ONE CAPSULE BY MOUTH TWICE A DAY 180 capsule 1   magnesium  aspartate (MAGINEX) 615 MG tablet Take 1 tablet (615 mg total) by mouth 2 (two) times daily. 180 tablet 3   Multiple Vitamins-Minerals (MULTIVITAMIN WOMENS 50+ ADV PO) Take 1 tablet by mouth daily.     ondansetron  (ZOFRAN ) 4 MG tablet Take 1 tablet (4 mg total) by mouth every 8 (eight) hours as needed for nausea or vomiting. 20 tablet 0   pantoprazole  (PROTONIX ) 40 MG tablet TAKE 1 TABLET BY MOUTH 30 MINUTES PRIOR TO BREAKFAST AND SUPPER 180 tablet 3   potassium chloride  SA (KLOR-CON  M) 20 MEQ tablet Take 1 tablet (20 mEq total) by mouth daily. 90 tablet 3   tiZANidine  (ZANAFLEX ) 4 MG tablet Take 1 tablet (4 mg total) by mouth 2 (two) times daily as needed for muscle spasms. 30 tablet 0   topiramate  (TOPAMAX ) 50 MG tablet TAKE ONE TABLET BY MOUTH EVERY DAY AT BEDTIME 90 tablet 1   umeclidinium-vilanterol (ANORO ELLIPTA ) 62.5-25 MCG/ACT AEPB Inhale 1 puff into the lungs daily. 180 each 4   colchicine  0.6 MG tablet Take 1 tablet (0.6 mg total) by mouth daily as needed (gout or psuedogout pain). 90 tablet 2   dicyclomine  (BENTYL ) 20 MG tablet Take 1 tablet (20 mg total) by mouth 3 (three) times daily before meals. 270 tablet 3   nitroGLYCERIN  (NITROSTAT ) 0.4 MG SL tablet Place 1 tablet (0.4 mg total) under the tongue every 5 (five) minutes as needed. 25 tablet 1   traMADol  (ULTRAM ) 50 MG tablet Take 1 tablet (50 mg total) by mouth every 12 (twelve) hours as needed for moderate pain (pain score 4-6) or severe pain (pain score 7-10). 15 tablet 0   Current Facility-Administered Medications  Medication Dose Route Frequency Provider Last Rate Last Admin   0.9 %  sodium chloride  infusion  500 mL Intravenous Once Stacia Glendia BRAVO, MD        Allergies as of 06/22/2024 - Review Complete 06/22/2024  Allergen Reaction Noted   Aspirin  Other (See Comments) 06/20/2008   Trazodone  and nefazodone Nausea And Vomiting 03/08/2014   Flagyl [metronidazole] Rash 01/16/2014   Morphine  and codeine Itching 09/23/2011    Family History  Problem Relation Age of Onset   Esophageal cancer Son        deceased   Breast cancer Son        esophageal   Diabetes Mother    Heart disease Mother    Kidney disease Sister    Diabetes Father    Hypertension Father    Kidney disease Brother        x 3   Colon cancer Paternal Uncle    Breast cancer Maternal Aunt    Breast cancer Paternal Aunt    Breast cancer Cousin        Pt states she has 15+ cousins w/ Breast CA   Rectal cancer Neg Hx    Stomach cancer Neg Hx     Social History   Socioeconomic History   Marital status: Widowed    Spouse name: Not on file   Number of children: 2   Years of education: 10   Highest education level: 10th grade  Occupational History   Occupation: RETIRED  Tobacco Use  Smoking status: Former    Current packs/day: 0.00    Average packs/day: 1 pack/day for 10.0 years (10.0 ttl pk-yrs)    Types: Cigarettes    Start date: 09/23/1975    Quit date: 09/22/1985    Years since quitting: 38.7   Smokeless tobacco: Never  Vaping Use   Vaping status: Never Used  Substance and Sexual Activity   Alcohol use: Yes    Comment: maybe 2 glasses of wine a month   Drug use: Never   Sexual activity: Not Currently    Birth control/protection: Surgical    Comment: 1st intercourse- 15, partners- 5  Other Topics Concern   Not on file  Social History Narrative   Not on file   Social Drivers of Health   Tobacco Use: Medium Risk (06/22/2024)   Patient History    Smoking Tobacco Use: Former    Smokeless Tobacco Use: Never    Passive Exposure: Not on file  Financial Resource Strain: Low Risk (12/20/2023)   Overall Financial Resource Strain (CARDIA)    Difficulty of Paying Living Expenses: Not hard at all  Food Insecurity: No Food Insecurity (12/20/2023)   Epic     Worried About Radiation Protection Practitioner of Food in the Last Year: Never true    Ran Out of Food in the Last Year: Never true  Transportation Needs: No Transportation Needs (12/20/2023)   Epic    Lack of Transportation (Medical): No    Lack of Transportation (Non-Medical): No  Physical Activity: Insufficiently Active (12/20/2023)   Exercise Vital Sign    Days of Exercise per Week: 7 days    Minutes of Exercise per Session: 20 min  Stress: No Stress Concern Present (12/20/2023)   [employer] of Occupational Health - Occupational Stress Questionnaire    Feeling of Stress: Not at all  Social Connections: Socially Integrated (12/20/2023)   Social Connection and Isolation Panel    Frequency of Communication with Friends and Family: More than three times a week    Frequency of Social Gatherings with Friends and Family: More than three times a week    Attends Religious Services: More than 4 times per year    Active Member of Clubs or Organizations: Yes    Attends Banker Meetings: More than 4 times per year    Marital Status: Married  Catering Manager Violence: Not At Risk (12/20/2023)   Epic    Fear of Current or Ex-Partner: No    Emotionally Abused: No    Physically Abused: No    Sexually Abused: No  Depression (PHQ2-9): Medium Risk (01/07/2024)   Depression (PHQ2-9)    PHQ-2 Score: 10  Alcohol Screen: Low Risk (12/20/2023)   Alcohol Screen    Last Alcohol Screening Score (AUDIT): 0  Housing: Unknown (12/20/2023)   Epic    Unable to Pay for Housing in the Last Year: No    Number of Times Moved in the Last Year: Not on file    Homeless in the Last Year: No  Utilities: Not At Risk (12/20/2023)   Epic    Threatened with loss of utilities: No  Health Literacy: Adequate Health Literacy (12/20/2023)   B1300 Health Literacy    Frequency of need for help with medical instructions: Never    Review of Systems:  All other review of systems negative except as mentioned in the  HPI.  Physical Exam: Vital signs BP (!) 151/86   Pulse 69   Temp 98.4 F (36.9 C) (Skin)  Resp 16   Ht 5' 5 (1.651 m)   Wt 211 lb (95.7 kg)   SpO2 99%   BMI 35.11 kg/m   General:   Alert,  Well-developed, well-nourished, pleasant and cooperative in NAD Airway:  Mallampati 3 Lungs:  Clear throughout to auscultation.   Heart:  Regular rate and rhythm; no murmurs, clicks, rubs,  or gallops. Abdomen:  Soft, nontender and nondistended. Normal bowel sounds.   Neuro/Psych:  Normal mood and affect. A and O x 3   Elizabet Schweppe E. Stacia, MD [facility]

## 2024-06-22 NOTE — Progress Notes (Signed)
 Vss nad trans to pacu

## 2024-06-22 NOTE — Progress Notes (Unsigned)
 Called to room to assist during endoscopic procedure.  Patient ID and intended procedure confirmed with present staff. Received instructions for my participation in the procedure from the performing physician.

## 2024-06-22 NOTE — Progress Notes (Unsigned)
 Pt's states no medical or surgical changes since previsit or office visit.

## 2024-06-23 ENCOUNTER — Telehealth: Payer: Self-pay

## 2024-06-23 NOTE — Telephone Encounter (Signed)
" °  Follow up Call-     06/22/2024    2:38 PM  Call back number  Post procedure Call Back phone  # [phone]  Permission to leave phone message Yes     Patient questions:  Do you have a fever, pain , or abdominal swelling? No. Pain Score  0 *  Have you tolerated food without any problems? Yes.    Have you been able to return to your normal activities? Yes.    Do you have any questions about your discharge instructions: Diet   No. Medications  No. Follow up visit  No.  Do you have questions or concerns about your Care? No.  Actions: * If pain score is 4 or above: No action needed, pain <4.  "

## 2024-06-28 LAB — SURGICAL PATHOLOGY

## 2024-06-29 ENCOUNTER — Ambulatory Visit: Payer: Self-pay | Admitting: Gastroenterology

## 2024-12-20 ENCOUNTER — Ambulatory Visit
